# Patient Record
Sex: Male | Born: 1945 | Race: Black or African American | Hispanic: No | Marital: Married | Smoking: Former smoker
Health system: Southern US, Community
[De-identification: ages and names within clinical notes are randomized; demographics above are authoritative.]

## PROBLEM LIST (undated history)

## (undated) DIAGNOSIS — I10 Essential (primary) hypertension: Secondary | ICD-10-CM

## (undated) DIAGNOSIS — K219 Gastro-esophageal reflux disease without esophagitis: Secondary | ICD-10-CM

## (undated) DIAGNOSIS — E785 Hyperlipidemia, unspecified: Secondary | ICD-10-CM

## (undated) DIAGNOSIS — R768 Other specified abnormal immunological findings in serum: Secondary | ICD-10-CM

## (undated) DIAGNOSIS — B1920 Unspecified viral hepatitis C without hepatic coma: Secondary | ICD-10-CM

## (undated) DIAGNOSIS — K5792 Diverticulitis of intestine, part unspecified, without perforation or abscess without bleeding: Secondary | ICD-10-CM

## (undated) HISTORY — DX: Gastro-esophageal reflux disease without esophagitis: K21.9

## (undated) HISTORY — DX: Unspecified viral hepatitis C without hepatic coma: B19.20

## (undated) HISTORY — DX: Diverticulitis of intestine, part unspecified, without perforation or abscess without bleeding: K57.92

## (undated) HISTORY — PX: OTHER SURGICAL HISTORY: SHX169

## (undated) HISTORY — DX: Other specified abnormal immunological findings in serum: R76.8

## (undated) HISTORY — DX: Essential (primary) hypertension: I10

## (undated) HISTORY — DX: Hyperlipidemia, unspecified: E78.5

---

## 2009-10-25 DIAGNOSIS — K5792 Diverticulitis of intestine, part unspecified, without perforation or abscess without bleeding: Secondary | ICD-10-CM | POA: Insufficient documentation

## 2009-10-28 ENCOUNTER — Inpatient Hospital Stay (HOSPITAL_COMMUNITY): Admission: EM | Admit: 2009-10-28 | Discharge: 2009-10-30 | Payer: Self-pay | Admitting: Emergency Medicine

## 2009-10-29 ENCOUNTER — Ambulatory Visit: Payer: Self-pay | Admitting: Gastroenterology

## 2009-11-03 ENCOUNTER — Inpatient Hospital Stay (HOSPITAL_COMMUNITY): Admission: EM | Admit: 2009-11-03 | Discharge: 2009-11-06 | Payer: Self-pay | Admitting: Emergency Medicine

## 2009-11-04 ENCOUNTER — Encounter: Payer: Self-pay | Admitting: Internal Medicine

## 2009-11-05 ENCOUNTER — Ambulatory Visit: Payer: Self-pay | Admitting: Gastroenterology

## 2009-11-05 ENCOUNTER — Encounter (INDEPENDENT_AMBULATORY_CARE_PROVIDER_SITE_OTHER): Payer: Self-pay

## 2009-12-23 ENCOUNTER — Encounter (INDEPENDENT_AMBULATORY_CARE_PROVIDER_SITE_OTHER): Payer: Self-pay | Admitting: *Deleted

## 2010-04-26 NOTE — Letter (Signed)
Summary: CONSULTATION  CONSULTATION   Imported By: Rexene Alberts 11/04/2009 16:06:34  _____________________________________________________________________  External Attachment:    Type:   Image     Comment:   External Document

## 2010-04-26 NOTE — Miscellaneous (Signed)
Summary: CONSULTATION  Clinical Lists Changes  NAME:  Logan Price, Logan Price                 ACCOUNT NO.:  [id number]      MEDICAL RECORD NO.:  [id number]          PATIENT TYPE:  INP      LOCATION:  A231                          FACILITY:  [facility]      PHYSICIAN:  Jonette Eva, M.D.     DATE OF BIRTH:  10-17-1945      DATE OF CONSULTATION:  10/29/2009   DATE OF DISCHARGE:                                    CONSULTATION      PHYSICIAN REQUESTING CONSULTATION:  Dr. Osvaldo Shipper with [facility].      REASON FOR CONSULTATION:  GI bleed.      No PCP yet.      HISTORY OF PRESENT ILLNESS:  Patient is a very pleasant 65 year old   African American gentleman who presented to the hospital yesterday with   acute onset GI bleeding.  He has a history of hypertension, but,   otherwise, is quite healthy.  Around 2:30 yesterday afternoon he felt   the urge to have a bowel movement, and he went to the bathroom and   passed a large quantity of blood mixed in stool.  He denies any   associated abdominal, nausea or vomiting.  No fever or chills.  He did   not feel dizzy or lightheaded.  About an hour later he had another   episode of large volume blood per rectum.  He denies passing any blood   clots.  No melena.  At around 7 o'clock he had his third episode,   although states it was a smaller amount of blood noted at that time.      He rarely has heartburn.  Denies any nausea or vomiting, weight loss.   He has been very healthy.  He recalls 3 years ago when he had a routine   elective colonoscopy.  He had 3 polyps removed, and this was complicated   by post polypectomy bleed, but did not have to be rescoped.  He is not   sure if he had diverticulosis at the time.      MEDICATIONS AT HOME:   1. Aspirin 81 mg daily.   2. Norvasc 10 mg daily.   3. Multivitamin daily.      ALLERGIES:  No known drug allergies.      PAST MEDICAL HISTORY:   1. Hypertension.   2. History of colonoscopy as  outlined above.      PAST SURGICAL HISTORY:  None.      SOCIAL HISTORY:  He moved here in June with his wife from [place].  He   has a sibling as well as his father is here.  He is retired from the   [employer], site.  He quit smoking 4 years ago.  He   consumes about 20 ounces of beer on a daily basis and 2 drinks of liquor   over the weekend.  Denies any drug use.      FAMILY HISTORY:  Mother died of breast cancer.  Father has a pacemaker   and hypertension.  Sister has diabetes.      REVIEW OF SYSTEMS:  See HPI for GI and CONSTITUTIONAL.  CARDIOPULMONARY:   Denies chest pain, shortness of breath, palpitations or cough.   GENITOURINARY:  Denies any dysuria, hematuria      PHYSICAL EXAMINATION:  Temp 97.6, pulse 78, respirations 18, blood   pressure 116/66, O2 saturations 96% on room air, weight 75.4 kg, height   69 inches.  GENERAL:  Pleasant, well-nourished, well-developed black   gentleman in no acute distress.   SKIN:  Warm and dry.  No jaundice.   HEENT:  Sclerae nonicteric.  Oropharyngeal mucosa moist and pink.  No   lesions, erythema or exudate.  No lymphadenopathy, thyromegaly.   CHEST/LUNGS:  Clear to auscultation.   CARDIAC:  Reveals regular rate and rhythm.  No murmurs, rubs, no   gallops.   ABDOMEN:  Positive bowel sounds, soft, nontender, nondistended.  No   organomegaly or masses.  No rebound or guarding.  No abdominal bruits or   hernias.   LOWER EXTREMITIES:  No edema.   RECTAL:  Done in the ED showed gross blood per rectum, nonbleeding   external hemorrhoid noted as well.      LABORATORIES:  Sodium 139, potassium 4, BUN 8, creatinine 0.85, glucose   106. LFTs normal except albumin of 3.2.  Initially his hemoglobin was   13.2, around 1500 yesterday.  At 2200 it was 11.3, and this morning   11.5.  His white count 7300, platelets 193,000.  INR is 1.      IMPRESSION:  Patient is a very pleasant 65 year old gentleman who   presents with lower  gastrointestinal bleed with a 2 g drop in his   hemoglobin.  Etiology unclear at this time.  He could have a   diverticular bleed, bleeding from polyps, less likely malignancy.  Did   not appear to be related to ischemic colitis given no abdominal pain.      RECOMMENDATIONS:   1. Serial H and H while active bleeding.   2. Discuss further with Dr. Darrick Penna regarding possible colonoscopy       versus waiting for records first.               Tana Coast, P.A.         ______________________________   Jonette Eva, M.D.         LL/MEDQ  D:  10/29/2009  T:  10/29/2009  Job:  [id number]      cc:   Osvaldo Shipper, MD      Electronically Signed by Tana Coast P.A. on 11/01/2009 08:41:02 AM   Electronically Signed by Jonette Eva M.D. on 12/10/2009 03:29:29 PM

## 2010-06-10 LAB — TYPE AND SCREEN
ABO/RH(D): O POS
Antibody Screen: NEGATIVE

## 2010-06-10 LAB — CBC
HCT: 28 % — ABNORMAL LOW (ref 39.0–52.0)
HCT: 28.3 % — ABNORMAL LOW (ref 39.0–52.0)
HCT: 28.4 % — ABNORMAL LOW (ref 39.0–52.0)
HCT: 30.8 % — ABNORMAL LOW (ref 39.0–52.0)
HCT: 30.9 % — ABNORMAL LOW (ref 39.0–52.0)
HCT: 31.1 % — ABNORMAL LOW (ref 39.0–52.0)
HCT: 33.7 % — ABNORMAL LOW (ref 39.0–52.0)
HCT: 34.1 % — ABNORMAL LOW (ref 39.0–52.0)
Hemoglobin: 10.4 g/dL — ABNORMAL LOW (ref 13.0–17.0)
Hemoglobin: 10.7 g/dL — ABNORMAL LOW (ref 13.0–17.0)
Hemoglobin: 11.3 g/dL — ABNORMAL LOW (ref 13.0–17.0)
Hemoglobin: 9.4 g/dL — ABNORMAL LOW (ref 13.0–17.0)
Hemoglobin: 9.7 g/dL — ABNORMAL LOW (ref 13.0–17.0)
Hemoglobin: 9.7 g/dL — ABNORMAL LOW (ref 13.0–17.0)
MCH: 30.9 pg (ref 26.0–34.0)
MCH: 31 pg (ref 26.0–34.0)
MCH: 31.1 pg (ref 26.0–34.0)
MCH: 31.3 pg (ref 26.0–34.0)
MCH: 31.5 pg (ref 26.0–34.0)
MCHC: 33.6 g/dL (ref 30.0–36.0)
MCHC: 33.7 g/dL (ref 30.0–36.0)
MCHC: 34.3 g/dL (ref 30.0–36.0)
MCHC: 34.4 g/dL (ref 30.0–36.0)
MCV: 91.3 fL (ref 78.0–100.0)
MCV: 91.6 fL (ref 78.0–100.0)
MCV: 91.9 fL (ref 78.0–100.0)
MCV: 92.3 fL (ref 78.0–100.0)
MCV: 92.7 fL (ref 78.0–100.0)
Platelets: 227 10*3/uL (ref 150–400)
Platelets: 229 10*3/uL (ref 150–400)
RBC: 3.39 MIL/uL — ABNORMAL LOW (ref 4.22–5.81)
RBC: 3.6 MIL/uL — ABNORMAL LOW (ref 4.22–5.81)
RBC: 3.63 MIL/uL — ABNORMAL LOW (ref 4.22–5.81)
RBC: 4.22 MIL/uL (ref 4.22–5.81)
RDW: 14.6 % (ref 11.5–15.5)
RDW: 14.6 % (ref 11.5–15.5)
RDW: 14.6 % (ref 11.5–15.5)
RDW: 14.7 % (ref 11.5–15.5)
RDW: 14.8 % (ref 11.5–15.5)
WBC: 5.7 10*3/uL (ref 4.0–10.5)
WBC: 5.9 10*3/uL (ref 4.0–10.5)
WBC: 6.7 10*3/uL (ref 4.0–10.5)
WBC: 6.9 10*3/uL (ref 4.0–10.5)
WBC: 7.4 10*3/uL (ref 4.0–10.5)
WBC: 7.6 10*3/uL (ref 4.0–10.5)

## 2010-06-10 LAB — DIFFERENTIAL
Basophils Absolute: 0 10*3/uL (ref 0.0–0.1)
Basophils Absolute: 0 10*3/uL (ref 0.0–0.1)
Basophils Absolute: 0 10*3/uL (ref 0.0–0.1)
Basophils Absolute: 0.1 10*3/uL (ref 0.0–0.1)
Basophils Relative: 1 % (ref 0–1)
Basophils Relative: 1 % (ref 0–1)
Basophils Relative: 1 % (ref 0–1)
Basophils Relative: 1 % (ref 0–1)
Basophils Relative: 1 % (ref 0–1)
Eosinophils Absolute: 0.1 10*3/uL (ref 0.0–0.7)
Eosinophils Absolute: 0.1 10*3/uL (ref 0.0–0.7)
Eosinophils Absolute: 0.1 10*3/uL (ref 0.0–0.7)
Eosinophils Absolute: 0.1 10*3/uL (ref 0.0–0.7)
Eosinophils Absolute: 0.1 10*3/uL (ref 0.0–0.7)
Eosinophils Absolute: 0.1 10*3/uL (ref 0.0–0.7)
Eosinophils Relative: 2 % (ref 0–5)
Eosinophils Relative: 2 % (ref 0–5)
Eosinophils Relative: 2 % (ref 0–5)
Eosinophils Relative: 2 % (ref 0–5)
Eosinophils Relative: 2 % (ref 0–5)
Lymphocytes Relative: 24 % (ref 12–46)
Lymphocytes Relative: 25 % (ref 12–46)
Lymphocytes Relative: 29 % (ref 12–46)
Lymphocytes Relative: 30 % (ref 12–46)
Lymphocytes Relative: 31 % (ref 12–46)
Lymphocytes Relative: 37 % (ref 12–46)
Lymphs Abs: 1.6 10*3/uL (ref 0.7–4.0)
Lymphs Abs: 1.9 10*3/uL (ref 0.7–4.0)
Lymphs Abs: 1.9 10*3/uL (ref 0.7–4.0)
Lymphs Abs: 1.9 10*3/uL (ref 0.7–4.0)
Lymphs Abs: 2.1 10*3/uL (ref 0.7–4.0)
Lymphs Abs: 2.2 10*3/uL (ref 0.7–4.0)
Monocytes Absolute: 0.5 10*3/uL (ref 0.1–1.0)
Monocytes Absolute: 0.5 10*3/uL (ref 0.1–1.0)
Monocytes Absolute: 0.6 10*3/uL (ref 0.1–1.0)
Monocytes Absolute: 0.6 10*3/uL (ref 0.1–1.0)
Monocytes Absolute: 0.6 10*3/uL (ref 0.1–1.0)
Monocytes Absolute: 0.7 10*3/uL (ref 0.1–1.0)
Monocytes Relative: 6 % (ref 3–12)
Monocytes Relative: 7 % (ref 3–12)
Monocytes Relative: 8 % (ref 3–12)
Monocytes Relative: 8 % (ref 3–12)
Monocytes Relative: 9 % (ref 3–12)
Monocytes Relative: 9 % (ref 3–12)
Monocytes Relative: 9 % (ref 3–12)
Neutro Abs: 3 10*3/uL (ref 1.7–7.7)
Neutro Abs: 3.5 10*3/uL (ref 1.7–7.7)
Neutro Abs: 3.9 10*3/uL (ref 1.7–7.7)
Neutro Abs: 4.4 10*3/uL (ref 1.7–7.7)
Neutro Abs: 4.4 10*3/uL (ref 1.7–7.7)
Neutro Abs: 4.4 10*3/uL (ref 1.7–7.7)
Neutro Abs: 5.4 10*3/uL (ref 1.7–7.7)
Neutrophils Relative %: 51 % (ref 43–77)
Neutrophils Relative %: 51 % (ref 43–77)
Neutrophils Relative %: 58 % (ref 43–77)
Neutrophils Relative %: 60 % (ref 43–77)
Neutrophils Relative %: 62 % (ref 43–77)
Neutrophils Relative %: 68 % (ref 43–77)

## 2010-06-10 LAB — BASIC METABOLIC PANEL
BUN: 15 mg/dL (ref 6–23)
Calcium: 8.7 mg/dL (ref 8.4–10.5)
Chloride: 108 mEq/L (ref 96–112)
Creatinine, Ser: 1.01 mg/dL (ref 0.4–1.5)
GFR calc Af Amer: >60 mL/min (ref >60)

## 2010-06-10 LAB — COMPREHENSIVE METABOLIC PANEL
ALT: 12 U/L (ref 0–53)
Alkaline Phosphatase: 39 U/L (ref 39–117)
Alkaline Phosphatase: 40 U/L (ref 39–117)
BUN: 8 mg/dL (ref 6–23)
CO2: 26 mEq/L (ref 19–32)
Calcium: 8.1 mg/dL — ABNORMAL LOW (ref 8.4–10.5)
Creatinine, Ser: 0.85 mg/dL (ref 0.4–1.5)
GFR calc non Af Amer: >60 mL/min (ref >60)
Glucose, Bld: 106 mg/dL — ABNORMAL HIGH (ref 70–99)
Glucose, Bld: 119 mg/dL — ABNORMAL HIGH (ref 70–99)
Potassium: 3.6 mEq/L (ref 3.5–5.1)
Sodium: 141 mEq/L (ref 135–145)
Total Protein: 5.7 g/dL — ABNORMAL LOW (ref 6.0–8.3)
Total Protein: 6.3 g/dL (ref 6.0–8.3)

## 2010-06-10 LAB — PROTIME-INR
INR: 1.02 (ref 0.00–1.49)
Prothrombin Time: 13.4 seconds (ref 11.6–15.2)

## 2010-06-10 LAB — HEMOGLOBIN AND HEMATOCRIT, BLOOD: HCT: 35.3 % — ABNORMAL LOW (ref 39.0–52.0)

## 2010-06-21 DIAGNOSIS — I10 Essential (primary) hypertension: Secondary | ICD-10-CM

## 2010-06-21 DIAGNOSIS — K5792 Diverticulitis of intestine, part unspecified, without perforation or abscess without bleeding: Secondary | ICD-10-CM

## 2010-06-21 DIAGNOSIS — E785 Hyperlipidemia, unspecified: Secondary | ICD-10-CM

## 2012-04-10 DIAGNOSIS — E785 Hyperlipidemia, unspecified: Secondary | ICD-10-CM | POA: Diagnosis not present

## 2012-04-10 DIAGNOSIS — I10 Essential (primary) hypertension: Secondary | ICD-10-CM | POA: Diagnosis not present

## 2012-10-08 ENCOUNTER — Ambulatory Visit (INDEPENDENT_AMBULATORY_CARE_PROVIDER_SITE_OTHER): Payer: Medicare Other | Admitting: Family Medicine

## 2012-10-08 ENCOUNTER — Encounter: Payer: Self-pay | Admitting: Family Medicine

## 2012-10-08 VITALS — BP 126/79 | HR 69 | Temp 97.6°F | Ht 68.0 in | Wt 171.2 lb

## 2012-10-08 DIAGNOSIS — I10 Essential (primary) hypertension: Secondary | ICD-10-CM

## 2012-10-08 DIAGNOSIS — E785 Hyperlipidemia, unspecified: Secondary | ICD-10-CM | POA: Diagnosis not present

## 2012-10-08 DIAGNOSIS — Z125 Encounter for screening for malignant neoplasm of prostate: Secondary | ICD-10-CM

## 2012-10-08 DIAGNOSIS — K219 Gastro-esophageal reflux disease without esophagitis: Secondary | ICD-10-CM | POA: Diagnosis not present

## 2012-10-08 MED ORDER — AMLODIPINE BESYLATE 10 MG PO TABS: 10.0000 mg | ORAL_TABLET | Freq: Every day | ORAL | Status: DC

## 2012-10-08 MED ORDER — SIMVASTATIN 40 MG PO TABS: 40.0000 mg | ORAL_TABLET | Freq: Every day | ORAL | Status: DC

## 2012-10-08 NOTE — Progress Notes (Signed)
Patient ID: Logan Price, male   DOB: [birth year], 67 y.o.   MRN: [id number] SUBJECTIVE: CC: Chief Complaint  Patient presents with  . Follow-up    6 month follow up    HPI: 1) Patient is here for follow up of hypertension: denies Headache;deniesChest Pain;denies weakness;denies Shortness of Breath or Orthopnea;denies Visual changes;denies palpitations;denies cough;denies pedal edema;denies symptoms of TIA or stroke; admits to Compliance with medications. denies Problems with medications.  2)Patient is here for follow up of hyperlipidemia: denies Headache;denies Chest Pain;denies weakness;denies Shortness of Breath and orthopnea;denies Visual changes;denies palpitations;denies cough;denies pedal edema;denies symptoms of TIA or stroke;deniesClaudication symptoms. admits to Compliance with medications; denies Problems with medications.  Past Medical History  Diagnosis Date  . Diverticulitis   . HTN (hypertension)   . Hyperlipidemia    No past surgical history on file. History   Social History  . Marital Status: Married    Spouse Name: N/A    Number of Children: N/A  . Years of Education: N/A   Occupational History  . Not on file.   Social History Main Topics  . Smoking status: Former Smoker    Quit date: 10/09/2006  . Smokeless tobacco: Not on file  . Alcohol Use: Not on file  . Drug Use: Not on file  . Sexually Active: Not on file   Other Topics Concern  . Not on file   Social History Narrative  . No narrative on file   No family history on file. Current Outpatient Prescriptions on File Prior to Visit  Medication Sig Dispense Refill  . multivitamin (THERAGRAN) per tablet Take 1 tablet by mouth daily.        . Omeprazole Magnesium (PRILOSEC OTC PO) Take by mouth. OTC        No current facility-administered medications on file prior to visit.   No Known Allergies Immunization History  Administered Date(s) Administered  . Tdap 03/04/2010   Prior to Admission  medications   Medication Sig Start Date End Date Taking? Authorizing Provider  amLODipine (NORVASC) 10 MG tablet Take 1 tablet (10 mg total) by mouth daily. 10/08/12  Yes Ileana Ladd, MD  multivitamin [facility]) per tablet Take 1 tablet by mouth daily.     Yes Historical Provider, MD  simvastatin (ZOCOR) 40 MG tablet Take 1 tablet (40 mg total) by mouth at bedtime. 10/08/12  Yes Ileana Ladd, MD  Omeprazole Magnesium (PRILOSEC OTC PO) Take by mouth. OTC     Historical Provider, MD     ROS: As above in the HPI. All other systems are stable or negative.  OBJECTIVE: APPEARANCE:  Patient in no acute distress.The patient appeared well nourished and normally developed. Acyanotic. Waist: VITAL SIGNS:BP 126/79  Pulse 69  Temp(Src) 97.6 F (36.4 C) (Oral)  Ht 5\' 8"  (1.727 m)  Wt 171 lb 3.2 oz (77.656 kg)  BMI 26.04 kg/m2  AAM  SKIN: warm and  Dry without overt rashes, tattoos and scars  HEAD and Neck: without JVD, Head and scalp: normal Eyes:No scleral icterus. Fundi normal, eye movements normal. Ears: Auricle normal, canal normal, Tympanic membranes normal, insufflation normal. Nose: normal Throat: normal Neck & thyroid: normal  CHEST & LUNGS: Chest wall: normal Lungs: Clear  CVS: Reveals the PMI to be normally located. Regular rhythm, First and Second Heart sounds are normal,  absence of murmurs, rubs or gallops. Peripheral vasculature: Radial pulses: normal Dorsal pedis pulses: normal Posterior pulses: normal  ABDOMEN:  Appearance: normal Benign, no organomegaly, no masses, no  Abdominal Aortic enlargement. No Guarding , no rebound. No Bruits. Bowel sounds: normal  RECTAL: N/A GU: N/A  EXTREMETIES: nonedematous. Both Femoral and Pedal pulses are normal.  MUSCULOSKELETAL:  Spine: normal Joints: intact  NEUROLOGIC: oriented to time,place and person; nonfocal. Strength is normal Sensory is normal Reflexes are normal Cranial Nerves are  normal.  ASSESSMENT: HTN (hypertension) - Plan: COMPLETE METABOLIC PANEL WITH GFR, amLODipine (NORVASC) 10 MG tablet, DISCONTINUED: amLODipine (NORVASC) 10 MG tablet  HLD (hyperlipidemia) - Plan: COMPLETE METABOLIC PANEL WITH GFR, NMR Lipoprofile with Lipids, simvastatin (ZOCOR) 40 MG tablet  GERD (gastroesophageal reflux disease)  Screening for prostate cancer - Plan: PSA    PLAN: Orders Placed This Encounter  Procedures  . COMPLETE METABOLIC PANEL WITH GFR  . NMR Lipoprofile with Lipids  . PSA   Meds ordered this encounter  Medications  . DISCONTD: amLODipine (NORVASC) 10 MG tablet    Sig: Take 1 tablet (10 mg total) by mouth daily.    Dispense:  30 tablet    Refill:  11  . simvastatin (ZOCOR) 40 MG tablet    Sig: Take 1 tablet (40 mg total) by mouth at bedtime.    Dispense:  30 tablet    Refill:  11  . amLODipine (NORVASC) 10 MG tablet    Sig: Take 1 tablet (10 mg total) by mouth daily.    Dispense:  90 tablet    Refill:  3        Dr Woodroe Mode Recommendations  Diet and Exercise discussed with patient.  For nutrition information, I recommend books:  1).Eat to Live by Dr Monico Hoar. 2).Prevent and Reverse Heart Disease by Dr Suzzette Righter. 3) Dr Katherina Right Book:  Program to Reverse Diabetes  Exercise recommendations are:  If unable to walk, then the patient can exercise in a chair 3 times a day. By flapping arms like a bird gently and raising legs outwards to the front.  If ambulatory, the patient can go for walks for 30 minutes 3 times a week. Then increase the intensity and duration as tolerated.  Goal is to try to attain exercise frequency to 5 times a week.  If applicable: Best to perform resistance exercises (machines or weights) 2 days a week and cardio type exercises 3 days per week.  Return in about 6 months (around 04/10/2013) for Recheck medical problems.  Constantine Ruddick P. [place]esto Charon, M.D.

## 2012-10-08 NOTE — Patient Instructions (Addendum)
      Dr Tally Mckinnon's Recommendations  Diet and Exercise discussed with patient.  For nutrition information, I recommend books:  1).Eat to Live by Dr Joel Fuhrman. 2).Prevent and Reverse Heart Disease by Dr Caldwell Esselstyn. 3) Dr Neal Barnard's Book:  Program to Reverse Diabetes  Exercise recommendations are:  If unable to walk, then the patient can exercise in a chair 3 times a day. By flapping arms like a bird gently and raising legs outwards to the front.  If ambulatory, the patient can go for walks for 30 minutes 3 times a week. Then increase the intensity and duration as tolerated.  Goal is to try to attain exercise frequency to 5 times a week.  If applicable: Best to perform resistance exercises (machines or weights) 2 days a week and cardio type exercises 3 days per week.  

## 2012-10-09 LAB — NMR LIPOPROFILE WITH LIPIDS
Cholesterol, Total: 179 mg/dL (ref <200)
HDL Particle Number: 48.5 umol/L (ref >=30.5)
HDL Size: 9.1 nm — ABNORMAL LOW (ref >=9.2)
HDL-C: 68 mg/dL (ref >=40)
LDL (calc): 99 mg/dL (ref <100)
LDL Particle Number: 1586 nmol/L — ABNORMAL HIGH (ref <1000)
LDL Size: 20.5 nm — ABNORMAL LOW (ref >20.5)
LP-IR Score: 66 — ABNORMAL HIGH (ref <=45)
Large HDL-P: 8.1 umol/L (ref >=4.8)
Large VLDL-P: 2.8 nmol/L — ABNORMAL HIGH (ref <=2.7)
Small LDL Particle Number: 745 nmol/L — ABNORMAL HIGH (ref <=527)
Triglycerides: 60 mg/dL (ref <150)
VLDL Size: 64.9 nm — ABNORMAL HIGH (ref <=46.6)

## 2012-10-09 LAB — COMPLETE METABOLIC PANEL WITH GFR
ALT: 17 U/L (ref 0–53)
AST: 24 U/L (ref 0–37)
Albumin: 4.4 g/dL (ref 3.5–5.2)
Alkaline Phosphatase: 57 U/L (ref 39–117)
BUN: 16 mg/dL (ref 6–23)
CO2: 27 mEq/L (ref 19–32)
Calcium: 9.5 mg/dL (ref 8.4–10.5)
Chloride: 105 mEq/L (ref 96–112)
Creat: 1.08 mg/dL (ref 0.50–1.35)
GFR, Est African American: 83 mL/min
GFR, Est Non African American: 72 mL/min
Glucose, Bld: 99 mg/dL (ref 70–99)
Potassium: 4.3 mEq/L (ref 3.5–5.3)
Sodium: 141 mEq/L (ref 135–145)
Total Bilirubin: 0.8 mg/dL (ref 0.3–1.2)
Total Protein: 7.7 g/dL (ref 6.0–8.3)

## 2012-10-09 LAB — PSA: PSA: 0.4 ng/mL (ref <=4.00)

## 2012-10-11 NOTE — Progress Notes (Signed)
Quick Note:  Lab result close to goalgoal. No change in Medications for now. Dietary changes and exercise is necessary. No Change in plans and follow up. ______

## 2012-10-30 ENCOUNTER — Other Ambulatory Visit: Payer: Self-pay

## 2012-11-08 ENCOUNTER — Ambulatory Visit (INDEPENDENT_AMBULATORY_CARE_PROVIDER_SITE_OTHER): Payer: Medicare Other | Admitting: Family Medicine

## 2012-11-08 DIAGNOSIS — T6391XA Toxic effect of contact with unspecified venomous animal, accidental (unintentional), initial encounter: Secondary | ICD-10-CM

## 2012-11-08 MED ORDER — TRIAMCINOLONE ACETONIDE 40 MG/ML IJ SUSP
60.0000 mg | Freq: Once | INTRAMUSCULAR | Status: AC
  Administered 2012-11-08: 60 mg via INTRAMUSCULAR

## 2012-11-08 MED ORDER — DOXYCYCLINE HYCLATE 100 MG PO TABS: 100.0000 mg | ORAL_TABLET | Freq: Two times a day (BID) | ORAL | Status: DC

## 2012-11-08 NOTE — Progress Notes (Signed)
Subjective:    Patient ID: Logan Price, male    DOB: Apr 02, 1945, 67 y.o.   MRN: [id number]  HPI This 67 y.o. male presents for evaluation of bee sting on the neck. He has swelling and discomfort on his neck.  The wasp sting  Happened yesterday.   Review of Systems C/o erythema and swelling on posterior neck.   No chest pain, SOB, HA, dizziness, vision change, N/V, diarrhea, constipation, dysuria, urinary urgency or frequency, myalgias, arthralgias or rash.  Objective:   Physical Exam  Vital signs noted  Well developed well nourished male.  HEENT - Head atraumatic Normocephalic                Throat - oropharanx wnl Respiratory - Lungs CTA bilateral Cardiac - RRR S1 and S2 without murmur Skin - are of 2.5cm posterior neck with erythema and swelling with central puncture      Assessment & Plan:  Wasp sting, initial encounter - Plan: triamcinolone acetonide (KENALOG-40) injection 60 mg, doxycycline (VIBRA-TABS) 100 MG tablet.  Apply ice to neck area and take benadryl otc as directed.  Tylenol and motrin otc as directed.

## 2012-11-08 NOTE — Patient Instructions (Signed)
Bee, Wasp, or Hornet Sting °Your caregiver has diagnosed you as having an insect sting. An insect sting appears as a red lump in the skin that sometimes has a tiny hole in the center, or it may have a stinger in the center of the wound. The most common stings are from wasps, hornets and bees. °Individuals have different reactions to insect stings. °· A normal reaction may cause pain, swelling, and redness around the sting site. °· A localized allergic reaction may cause swelling and redness that extends beyond the sting site. °· A large local reaction may continue to develop over the next 12 to 36 hours. °· On occasion, the reactions can be severe (anaphylactic reaction). An anaphylactic reaction may cause wheezing; difficulty breathing; chest pain; fainting; raised, itchy, red patches on the skin; a sick feeling to your stomach (nausea); vomiting; cramping; or diarrhea. If you have had an anaphylactic reaction to an insect sting in the past, you are more likely to have one again. °HOME CARE INSTRUCTIONS  °· With bee stings, a small sac of poison is left in the wound. Brushing across this with something such as a credit card, or anything similar, will help remove this and decrease the amount of the reaction. This same procedure will not help a wasp sting as they do not leave behind a stinger and poison sac. °· Apply a cold compress for 10 to 20 minutes every hour for 1 to 2 days, depending on severity, to reduce swelling and itching. °· To lessen pain, a paste made of water and baking soda may be rubbed on the bite or sting and left on for 5 minutes. °· To relieve itching and swelling, you may use take medication or apply medicated creams or lotions as directed. °· Only take over-the-counter or prescription medicines for pain, discomfort, or fever as directed by your caregiver. °· Wash the sting site daily with soap and water. Apply antibiotic ointment on the sting site as directed. °· If you suffered a severe  reaction: °· If you did not require hospitalization, an adult will need to stay with you for 24 hours in case the symptoms return. °· You may need to wear a medical bracelet or necklace stating the allergy. °· You and your family need to learn when and how to use an anaphylaxis kit or epinephrine injection. °· If you have had a severe reaction before, always carry your anaphylaxis kit with you. °SEEK MEDICAL CARE IF:  °· None of the above helps within 2 to 3 days. °· The area becomes red, warm, tender, and swollen beyond the area of the bite or sting. °· You have an oral temperature above 102° F (38.9° C). °SEEK IMMEDIATE MEDICAL CARE IF:  °You have symptoms of an allergic reaction which are: °· Wheezing. °· Difficulty breathing. °· Chest pain. °· Lightheadedness or fainting. °· Itchy, raised, red patches on the skin. °· Nausea, vomiting, cramping or diarrhea. °ANY OF THESE SYMPTOMS MAY REPRESENT A SERIOUS PROBLEM THAT IS AN EMERGENCY. Do not wait to see if the symptoms will go away. Get medical help right away. Call your local emergency services (911 in [place] DO NOT drive yourself to the hospital. °MAKE SURE YOU:  °· Understand these instructions. °· Will watch your condition. °· Will get help right away if you are not doing well or get worse. °Document Released: 03/13/2005 Document Revised: 06/05/2011 Document Reviewed: 08/28/2009 °ExitCare® Patient Information ©2014 [employer]. ° °

## 2012-12-06 ENCOUNTER — Other Ambulatory Visit: Payer: Self-pay | Admitting: *Deleted

## 2012-12-06 DIAGNOSIS — E785 Hyperlipidemia, unspecified: Secondary | ICD-10-CM

## 2012-12-06 MED ORDER — SIMVASTATIN 40 MG PO TABS: 40.0000 mg | ORAL_TABLET | Freq: Every day | ORAL | Status: DC

## 2013-01-30 ENCOUNTER — Other Ambulatory Visit: Payer: Self-pay

## 2013-04-11 ENCOUNTER — Ambulatory Visit (INDEPENDENT_AMBULATORY_CARE_PROVIDER_SITE_OTHER): Payer: Medicare Other | Admitting: Family Medicine

## 2013-04-11 ENCOUNTER — Encounter: Payer: Self-pay | Admitting: Family Medicine

## 2013-04-11 VITALS — BP 114/70 | HR 75 | Temp 97.5°F | Ht 68.0 in | Wt 171.4 lb

## 2013-04-11 DIAGNOSIS — K5732 Diverticulitis of large intestine without perforation or abscess without bleeding: Secondary | ICD-10-CM | POA: Diagnosis not present

## 2013-04-11 DIAGNOSIS — E785 Hyperlipidemia, unspecified: Secondary | ICD-10-CM

## 2013-04-11 DIAGNOSIS — I10 Essential (primary) hypertension: Secondary | ICD-10-CM

## 2013-04-11 DIAGNOSIS — K219 Gastro-esophageal reflux disease without esophagitis: Secondary | ICD-10-CM | POA: Diagnosis not present

## 2013-04-11 DIAGNOSIS — K5792 Diverticulitis of intestine, part unspecified, without perforation or abscess without bleeding: Secondary | ICD-10-CM

## 2013-04-11 NOTE — Progress Notes (Signed)
Patient ID: Logan Price, male   DOB: 07-12-1945, 68 y.o.   MRN: [id number] SUBJECTIVE: CC: Chief Complaint  Patient presents with  . Follow-up      6 month follow up no complaints     HPI: Patient is here for follow up of hyperlipidemia/HTN/GERD: denies Headache;denies Chest Pain;denies weakness;denies Shortness of Breath and orthopnea;denies Visual changes;denies palpitations;denies cough;denies pedal edema;denies symptoms of TIA or stroke;deniesClaudication symptoms. admits to Compliance with medications; denies Problems with medications.   Past Medical History  Diagnosis Date  . Diverticulitis   . HTN (hypertension)   . Hyperlipidemia    No past surgical history on file. History   Social History  . Marital Status: Married    Spouse Name: N/A    Number of Children: N/A  . Years of Education: N/A   Occupational History  . Not on file.   Social History Main Topics  . Smoking status: Former Smoker    Quit date: 10/09/2006  . Smokeless tobacco: Not on file  . Alcohol Use: No  . Drug Use: No  . Sexual Activity: Not on file   Other Topics Concern  . Not on file   Social History Narrative  . No narrative on file   No family history on file. Current Outpatient Prescriptions on File Prior to Visit  Medication Sig Dispense Refill  . amLODipine (NORVASC) 10 MG tablet Take 1 tablet (10 mg total) by mouth daily.  90 tablet  3  . multivitamin (THERAGRAN) per tablet Take 1 tablet by mouth daily.        . Omeprazole Magnesium (PRILOSEC OTC PO) Take by mouth. OTC       . simvastatin (ZOCOR) 40 MG tablet Take 1 tablet (40 mg total) by mouth at bedtime.  90 tablet  3  . doxycycline (VIBRA-TABS) 100 MG tablet Take 1 tablet (100 mg total) by mouth 2 (two) times daily.  20 tablet  0   No current facility-administered medications on file prior to visit.   No Known Allergies Immunization History  Administered Date(s) Administered  . Tdap 03/04/2010   Prior to Admission  medications   Medication Sig Start Date End Date Taking? Authorizing Provider  amLODipine (NORVASC) 10 MG tablet Take 1 tablet (10 mg total) by mouth daily. 10/08/12  Yes Ileana Ladd, MD  multivitamin [facility]) per tablet Take 1 tablet by mouth daily.     Yes Historical Provider, MD  Omeprazole Magnesium (PRILOSEC OTC PO) Take by mouth. OTC    Yes Historical Provider, MD  simvastatin (ZOCOR) 40 MG tablet Take 1 tablet (40 mg total) by mouth at bedtime. 12/06/12  Yes Ileana Ladd, MD  doxycycline (VIBRA-TABS) 100 MG tablet Take 1 tablet (100 mg total) by mouth 2 (two) times daily. 11/08/12   Deatra Canter, FNP     ROS: As above in the HPI. All other systems are stable or negative.  OBJECTIVE: APPEARANCE:  Patient in no acute distress.The patient appeared well nourished and normally developed. Acyanotic. Waist: VITAL SIGNS:BP 114/70  Pulse 75  Temp(Src) 97.5 F (36.4 C) (Oral)  Ht 5\' 8"  (1.727 m)  Wt 171 lb 6.4 oz (77.747 kg)  BMI 26.07 kg/m2  AAM  SKIN: warm and  Dry without overt rashes, tattoos and scars  HEAD and Neck: without JVD, Head and scalp: normal Eyes:No scleral icterus. Fundi normal, eye movements normal. Ears: Auricle normal, canal normal, Tympanic membranes normal, insufflation normal. Nose: normal Throat: normal Neck & thyroid: normal  CHEST &  LUNGS: Chest wall: normal Lungs: Clear  CVS: Reveals the PMI to be normally located. Regular rhythm, First and Second Heart sounds are normal,  absence of murmurs, rubs or gallops. Peripheral vasculature: Radial pulses: normal Dorsal pedis pulses: normal Posterior pulses: normal  ABDOMEN:  Appearance: normal Benign, no organomegaly, no masses, no Abdominal Aortic enlargement. No Guarding , no rebound. No Bruits. Bowel sounds: normal  RECTAL: N/A GU: N/A  EXTREMETIES: nonedematous.  MUSCULOSKELETAL:  Spine: normal Joints: intact  NEUROLOGIC: oriented to time,place and person;  nonfocal. Strength is normal Sensory is normal Reflexes are normal Cranial Nerves are normal.   Results for orders placed in visit on 10/08/12  COMPLETE METABOLIC PANEL WITH GFR      Result Value Range   Sodium 141  135 - 145 mEq/L   Potassium 4.3  3.5 - 5.3 mEq/L   Chloride 105  96 - 112 mEq/L   CO2 27  19 - 32 mEq/L   Glucose, Bld 99  70 - 99 mg/dL   BUN 16  6 - 23 mg/dL   Creat 5.62  1.30 - 8.65 mg/dL   Total Bilirubin 0.8  0.3 - 1.2 mg/dL   Alkaline Phosphatase 57  39 - 117 U/L   AST 24  0 - 37 U/L   ALT 17  0 - 53 U/L   Total Protein 7.7  6.0 - 8.3 g/dL   Albumin 4.4  3.5 - 5.2 g/dL   Calcium 9.5  8.4 - 78.4 mg/dL   GFR, Est African American 83     GFR, Est Non African American 72    NMR LIPOPROFILE WITH LIPIDS      Result Value Range   LDL Particle Number [id number] (*) <1000 nmol/L   LDL (calc) 99  <100 mg/dL   HDL-C 68  >=69 mg/dL   Triglycerides 60  <629 mg/dL   Cholesterol, Total 528  <200 mg/dL   HDL Particle Number 41.3  >=24.4 umol/L   Large HDL-P 8.1  >=4.8 umol/L   Large VLDL-P 2.8 (*) <=2.7 nmol/L   Small LDL Particle Number [id number] (*) <=527 nmol/L   LDL Size 20.5 (*) >20.5 nm   HDL Size 9.1 (*) >=9.2 nm   VLDL Size 64.9 (*) <=46.6 nm   LP-IR Score 66 (*) <=45  PSA      Result Value Range   PSA 0.40  <=4.00 ng/mL    ASSESSMENT:  HLD (hyperlipidemia) - Plan: CMP14+EGFR, Lipid panel  HTN (hypertension) - Plan: CMP14+EGFR  GERD (gastroesophageal reflux disease)  Diverticulitis  Doing well and stable.   PLAN:      Dr Woodroe Mode Recommendations  For nutrition information, I recommend books:  1).Eat to Live by Dr Monico Hoar. 2).Prevent and Reverse Heart Disease by Dr Suzzette Righter. 3) Dr Katherina Right Book:  Program to Reverse Diabetes  Exercise recommendations are:  If unable to walk, then the patient can exercise in a chair 3 times a day. By flapping arms like a bird gently and raising legs outwards to the front.  If ambulatory,  the patient can go for walks for 30 minutes 3 times a week. Then increase the intensity and duration as tolerated.  Goal is to try to attain exercise frequency to 5 times a week.  If applicable: Best to perform resistance exercises (machines or weights) 2 days a week and cardio type exercises 3 days per week.  Handouts in the AVS   Orders Placed This Encounter  Procedures  .  CMP14+EGFR  . Lipid panel    No orders of the defined types were placed in this encounter.   There are no discontinued medications. Return in about 6 months (around 10/09/2013) for Recheck medical problems.  Lakrista Scaduto P. [place] Charon, M.D.

## 2013-04-11 NOTE — Patient Instructions (Signed)
Dr Paula Libra Recommendations  For nutrition information, I recommend books:  1).Eat to Live by Dr Excell Seltzer. 2).Prevent and Reverse Heart Disease by Dr Karl Luke. 3) Dr Janene Harvey Book:  Program to Reverse Diabetes  Exercise recommendations are:  If unable to walk, then the patient can exercise in a chair 3 times a day. By flapping arms like a bird gently and raising legs outwards to the front.  If ambulatory, the patient can go for walks for 30 minutes 3 times a week. Then increase the intensity and duration as tolerated.  Goal is to try to attain exercise frequency to 5 times a week.  If applicable: Best to perform resistance exercises (machines or weights) 2 days a week and cardio type exercises 3 days per week.   Hypertension As your heart beats, it forces blood through your arteries. This force is your blood pressure. If the pressure is too high, it is called hypertension (HTN) or high blood pressure. HTN is dangerous because you may have it and not know it. High blood pressure may mean that your heart has to work harder to pump blood. Your arteries may be narrow or stiff. The extra work puts you at risk for heart disease, stroke, and other problems.  Blood pressure consists of two numbers, a higher number over a lower, 110/72, for example. It is stated as "110 over 72." The ideal is below 120 for the top number (systolic) and under 80 for the bottom (diastolic). Write down your blood pressure today. You should pay close attention to your blood pressure if you have certain conditions such as:  Heart failure.  Prior heart attack.  Diabetes  Chronic kidney disease.  Prior stroke.  Multiple risk factors for heart disease. To see if you have HTN, your blood pressure should be measured while you are seated with your arm held at the level of the heart. It should be measured at least twice. A one-time elevated blood pressure reading (especially in the  Emergency Department) does not mean that you need treatment. There may be conditions in which the blood pressure is different between your right and left arms. It is important to see your caregiver soon for a recheck. Most people have essential hypertension which means that there is not a specific cause. This type of high blood pressure may be lowered by changing lifestyle factors such as:  Stress.  Smoking.  Lack of exercise.  Excessive weight.  Drug/tobacco/alcohol use.  Eating less salt. Most people do not have symptoms from high blood pressure until it has caused damage to the body. Effective treatment can often prevent, delay or reduce that damage. TREATMENT  When a cause has been identified, treatment for high blood pressure is directed at the cause. There are a large number of medications to treat HTN. These fall into several categories, and your caregiver will help you select the medicines that are best for you. Medications may have side effects. You should review side effects with your caregiver. If your blood pressure stays high after you have made lifestyle changes or started on medicines,   Your medication(s) may need to be changed.  Other problems may need to be addressed.  Be certain you understand your prescriptions, and know how and when to take your medicine.  Be sure to follow up with your caregiver within the time frame advised (usually within two weeks) to have your blood pressure rechecked and to review your medications.  If  you are taking more than one medicine to lower your blood pressure, make sure you know how and at what times they should be taken. Taking two medicines at the same time can result in blood pressure that is too low. SEEK IMMEDIATE MEDICAL CARE IF:  You develop a severe headache, blurred or changing vision, or confusion.  You have unusual weakness or numbness, or a faint feeling.  You have severe chest or abdominal pain, vomiting, or breathing  problems. MAKE SURE YOU:   Understand these instructions.  Will watch your condition.  Will get help right away if you are not doing well or get worse. Document Released: 03/13/2005 Document Revised: 06/05/2011 Document Reviewed: 11/01/2007 [facility] Patient Information 2014 [place].   Hypertriglyceridemia  Diet for High blood levels of Triglycerides Most fats in food are triglycerides. Triglycerides in your blood are stored as fat in your body. High levels of triglycerides in your blood may put you at a greater risk for heart disease and stroke.  Normal triglyceride levels are less than 150 mg/dL. Borderline high levels are 150-199 mg/dl. High levels are 200 - 499 mg/dL, and very high triglyceride levels are greater than 500 mg/dL. The decision to treat high triglycerides is generally based on the level. For people with borderline or high triglyceride levels, treatment includes weight loss and exercise. Drugs are recommended for people with very high triglyceride levels. Many people who need treatment for high triglyceride levels have metabolic syndrome. This syndrome is a collection of disorders that often include: insulin resistance, high blood pressure, blood clotting problems, high cholesterol and triglycerides. TESTING PROCEDURE FOR TRIGLYCERIDES  You should not eat 4 hours before getting your triglycerides measured. The normal range of triglycerides is between 10 and 250 milligrams per deciliter (mg/dl). Some people may have extreme levels (1000 or above), but your triglyceride level may be too high if it is above 150 mg/dl, depending on what other risk factors you have for heart disease.  People with high blood triglycerides may also have high blood cholesterol levels. If you have high blood cholesterol as well as high blood triglycerides, your risk for heart disease is probably greater than if you only had high triglycerides. High blood cholesterol is one of the main risk factors  for heart disease. CHANGING YOUR DIET  Your weight can affect your blood triglyceride level. If you are more than 20% above your ideal body weight, you may be able to lower your blood triglycerides by losing weight. Eating less and exercising regularly is the best way to combat this. Fat provides more calories than any other food. The best way to lose weight is to eat less fat. Only 30% of your total calories should come from fat. Less than 7% of your diet should come from saturated fat. A diet low in fat and saturated fat is the same as a diet to decrease blood cholesterol. By eating a diet lower in fat, you may lose weight, lower your blood cholesterol, and lower your blood triglyceride level.  Eating a diet low in fat, especially saturated fat, may also help you lower your blood triglyceride level. Ask your dietitian to help you figure how much fat you can eat based on the number of calories your caregiver has prescribed for you.  Exercise, in addition to helping with weight loss may also help lower triglyceride levels.   Alcohol can increase blood triglycerides. You may need to stop drinking alcoholic beverages.  Too much carbohydrate in your diet may  also increase your blood triglycerides. Some complex carbohydrates are necessary in your diet. These may include bread, rice, potatoes, other starchy vegetables and cereals.  Reduce "simple" carbohydrates. These may include pure sugars, candy, honey, and jelly without losing other nutrients. If you have the kind of high blood triglycerides that is affected by the amount of carbohydrates in your diet, you will need to eat less sugar and less high-sugar foods. Your caregiver can help you with this.  Adding 2-4 grams of fish oil (EPA+ DHA) may also help lower triglycerides. Speak with your caregiver before adding any supplements to your regimen. Following the Diet  Maintain your ideal weight. Your caregivers can help you with a diet. Generally, eating  less food and getting more exercise will help you lose weight. Joining a weight control group may also help. Ask your caregivers for a good weight control group in your area.  Eat low-fat foods instead of high-fat foods. This can help you lose weight too.  These foods are lower in fat. Eat MORE of these:   Dried beans, peas, and lentils.  Egg whites.  Low-fat cottage cheese.  Fish.  Lean cuts of meat, such as round, sirloin, rump, and flank (cut extra fat off meat you fix).  Whole grain breads, cereals and pasta.  Skim and nonfat dry milk.  Low-fat yogurt.  Poultry without the skin.  Cheese made with skim or part-skim milk, such as mozzarella, parmesan, farmers', ricotta, or pot cheese. These are higher fat foods. Eat LESS of these:   Whole milk and foods made from whole milk, such as American, blue, cheddar, monterey jack, and swiss cheese  High-fat meats, such as luncheon meats, sausages, knockwurst, bratwurst, hot dogs, ribs, corned beef, ground pork, and regular ground beef.  Fried foods. Limit saturated fats in your diet. Substituting unsaturated fat for saturated fat may decrease your blood triglyceride level. You will need to read package labels to know which products contain saturated fats.  These foods are high in saturated fat. Eat LESS of these:   Fried pork skins.  Whole milk.  Skin and fat from poultry.  Palm oil.  Butter.  Shortening.  Cream cheese.  Berniece Salines.  Margarines and baked goods made from listed oils.  Vegetable shortenings.  Chitterlings.  Fat from meats.  Coconut oil.  Palm kernel oil.  Lard.  Cream.  Sour cream.  Fatback.  Coffee whiteners and non-dairy creamers made with these oils.  Cheese made from whole milk. Use unsaturated fats (both polyunsaturated and monounsaturated) moderately. Remember, even though unsaturated fats are better than saturated fats; you still want a diet low in total fat.  These foods are high in  unsaturated fat:   Canola oil.  Sunflower oil.  Mayonnaise.  Almonds.  Peanuts.  Pine nuts.  Margarines made with these oils.  Safflower oil.  Olive oil.  Avocados.  Cashews.  Peanut butter.  Sunflower seeds.  Soybean oil.  Peanut oil.  Olives.  Pecans.  Walnuts.  Pumpkin seeds. Avoid sugar and other high-sugar foods. This will decrease carbohydrates without decreasing other nutrients. Sugar in your food goes rapidly to your blood. When there is excess sugar in your blood, your liver may use it to make more triglycerides. Sugar also contains calories without other important nutrients.  Eat LESS of these:   Sugar, brown sugar, powdered sugar, jam, jelly, preserves, honey, syrup, molasses, pies, candy, cakes, cookies, frosting, pastries, colas, soft drinks, punches, fruit drinks, and regular gelatin.  Avoid alcohol. Alcohol,  even more than sugar, may increase blood triglycerides. In addition, alcohol is high in calories and low in nutrients. Ask for sparkling water, or a diet soft drink instead of an alcoholic beverage. Suggestions for planning and preparing meals   Bake, broil, grill or roast meats instead of frying.  Remove fat from meats and skin from poultry before cooking.  Add spices, herbs, lemon juice or vinegar to vegetables instead of salt, rich sauces or gravies.  Use a non-stick skillet without fat or use no-stick sprays.  Cool and refrigerate stews and broth. Then remove the hardened fat floating on the surface before serving.  Refrigerate meat drippings and skim off fat to make low-fat gravies.  Serve more fish.  Use less butter, margarine and other high-fat spreads on bread or vegetables.  Use skim or reconstituted non-fat dry milk for cooking.  Cook with low-fat cheeses.  Substitute low-fat yogurt or cottage cheese for all or part of the sour cream in recipes for sauces, dips or congealed salads.  Use half yogurt/half mayonnaise in  salad recipes.  Substitute evaporated skim milk for cream. Evaporated skim milk or reconstituted non-fat dry milk can be whipped and substituted for whipped cream in certain recipes.  Choose fresh fruits for dessert instead of high-fat foods such as pies or cakes. Fruits are naturally low in fat. When Dining Out   Order low-fat appetizers such as fruit or vegetable juice, pasta with vegetables or tomato sauce.  Select clear, rather than cream soups.  Ask that dressings and gravies be served on the side. Then use less of them.  Order foods that are baked, broiled, poached, steamed, stir-fried, or roasted.  Ask for margarine instead of butter, and use only a small amount.  Drink sparkling water, unsweetened tea or coffee, or diet soft drinks instead of alcohol or other sweet beverages. QUESTIONS AND ANSWERS ABOUT OTHER FATS IN THE BLOOD: SATURATED FAT, TRANS FAT, AND CHOLESTEROL What is trans fat? Trans fat is a type of fat that is formed when vegetable oil is hardened through a process called hydrogenation. This process helps makes foods more solid, gives them shape, and prolongs their shelf life. Trans fats are also called hydrogenated or partially hydrogenated oils.  What do saturated fat, trans fat, and cholesterol in foods have to do with heart disease? Saturated fat, trans fat, and cholesterol in the diet all raise the level of LDL "bad" cholesterol in the blood. The higher the LDL cholesterol, the greater the risk for coronary heart disease (CHD). Saturated fat and trans fat raise LDL similarly.  What foods contain saturated fat, trans fat, and cholesterol? High amounts of saturated fat are found in animal products, such as fatty cuts of meat, chicken skin, and full-fat dairy products like butter, whole milk, cream, and cheese, and in tropical vegetable oils such as palm, palm kernel, and coconut oil. Trans fat is found in some of the same foods as saturated fat, such as vegetable  shortening, some margarines (especially hard or stick margarine), crackers, cookies, baked goods, fried foods, salad dressings, and other processed foods made with partially hydrogenated vegetable oils. Small amounts of trans fat also occur naturally in some animal products, such as milk products, beef, and lamb. Foods high in cholesterol include liver, other organ meats, egg yolks, shrimp, and full-fat dairy products. How can I use the new food label to make heart-healthy food choices? Check the Nutrition Facts panel of the food label. Choose foods lower in saturated fat, trans fat,  and cholesterol. For saturated fat and cholesterol, you can also use the Percent Daily Value (%DV): 5% DV or less is low, and 20% DV or more is high. (There is no %DV for trans fat.) Use the Nutrition Facts panel to choose foods low in saturated fat and cholesterol, and if the trans fat is not listed, read the ingredients and limit products that list shortening or hydrogenated or partially hydrogenated vegetable oil, which tend to be high in trans fat. POINTS TO REMEMBER:   Discuss your risk for heart disease with your caregivers, and take steps to reduce risk factors.  Change your diet. Choose foods that are low in saturated fat, trans fat, and cholesterol.  Add exercise to your daily routine if it is not already being done. Participate in physical activity of moderate intensity, like brisk walking, for at least 30 minutes on most, and preferably all days of the week. No time? Break the 30 minutes into three, 10-minute segments during the day.  Stop smoking. If you do smoke, contact your caregiver to discuss ways in which they can help you quit.  Do not use street drugs.  Maintain a normal weight.  Maintain a healthy blood pressure.  Keep up with your blood work for checking the fats in your blood as directed by your caregiver. Document Released: 12/30/2003 Document Revised: 09/12/2011 Document Reviewed:  07/27/2008 [facility] Patient Information 2014 [place].

## 2013-04-12 LAB — LIPID PANEL
Chol/HDL Ratio: 2.2 ratio units (ref 0.0–5.0)
Cholesterol, Total: 165 mg/dL (ref 100–199)
HDL: 74 mg/dL (ref >39)
LDL Calculated: 81 mg/dL (ref 0–99)
Triglycerides: 51 mg/dL (ref 0–149)
VLDL Cholesterol Cal: 10 mg/dL (ref 5–40)

## 2013-04-12 LAB — CMP14+EGFR
ALT: 24 IU/L (ref 0–44)
AST: 23 IU/L (ref 0–40)
Albumin/Globulin Ratio: 2 (ref 1.1–2.5)
Albumin: 4.6 g/dL (ref 3.6–4.8)
Alkaline Phosphatase: 49 IU/L (ref 39–117)
BUN/Creatinine Ratio: 16 (ref 10–22)
BUN: 15 mg/dL (ref 8–27)
CO2: 24 mmol/L (ref 18–29)
Calcium: 8.9 mg/dL (ref 8.6–10.2)
Chloride: 103 mmol/L (ref 97–108)
Creatinine, Ser: 0.93 mg/dL (ref 0.76–1.27)
GFR calc Af Amer: 99 mL/min/{1.73_m2} (ref >59)
GFR calc non Af Amer: 85 mL/min/{1.73_m2} (ref >59)
Globulin, Total: 2.3 g/dL (ref 1.5–4.5)
Glucose: 95 mg/dL (ref 65–99)
Potassium: 4.6 mmol/L (ref 3.5–5.2)
Sodium: 143 mmol/L (ref 134–144)
Total Bilirubin: 0.4 mg/dL (ref 0.0–1.2)
Total Protein: 6.9 g/dL (ref 6.0–8.5)

## 2013-05-08 ENCOUNTER — Ambulatory Visit (INDEPENDENT_AMBULATORY_CARE_PROVIDER_SITE_OTHER): Payer: Medicare Other | Admitting: Family Medicine

## 2013-05-08 ENCOUNTER — Encounter: Payer: Self-pay | Admitting: Family Medicine

## 2013-05-08 VITALS — BP 133/78 | HR 77 | Temp 97.0°F | Ht 68.0 in | Wt 171.2 lb

## 2013-05-08 DIAGNOSIS — I10 Essential (primary) hypertension: Secondary | ICD-10-CM

## 2013-05-08 DIAGNOSIS — K219 Gastro-esophageal reflux disease without esophagitis: Secondary | ICD-10-CM | POA: Diagnosis not present

## 2013-05-08 DIAGNOSIS — E785 Hyperlipidemia, unspecified: Secondary | ICD-10-CM

## 2013-05-08 NOTE — Progress Notes (Signed)
Patient ID: Logan Price, male   DOB: Jul 24, 1945, 68 y.o.   MRN: [id number] SUBJECTIVE: CC: Chief Complaint  Patient presents with  . Follow-up    RECEIVED PAPER FROM [facility]     HPI: Risks of  Drug interaction with simvastatin 40 and amlodipine. Has done well for a while without problems.  Past Medical History  Diagnosis Date  . Diverticulitis   . HTN (hypertension)   . Hyperlipidemia    No past surgical history on file. History   Social History  . Marital Status: Married    Spouse Name: N/A    Number of Children: N/A  . Years of Education: N/A   Occupational History  . Not on file.   Social History Main Topics  . Smoking status: Former Smoker    Quit date: 10/09/2006  . Smokeless tobacco: Not on file  . Alcohol Use: No  . Drug Use: No  . Sexual Activity: Not on file   Other Topics Concern  . Not on file   Social History Narrative  . No narrative on file   No family history on file. Current Outpatient Prescriptions on File Prior to Visit  Medication Sig Dispense Refill  . amLODipine (NORVASC) 10 MG tablet Take 1 tablet (10 mg total) by mouth daily.  90 tablet  3  . multivitamin (THERAGRAN) per tablet Take 1 tablet by mouth daily.        . Omeprazole Magnesium (PRILOSEC OTC PO) Take by mouth. OTC       . doxycycline (VIBRA-TABS) 100 MG tablet Take 1 tablet (100 mg total) by mouth 2 (two) times daily.  20 tablet  0   No current facility-administered medications on file prior to visit.   No Known Allergies Immunization History  Administered Date(s) Administered  . Tdap 03/04/2010   Prior to Admission medications   Medication Sig Start Date End Date Taking? Authorizing Provider  amLODipine (NORVASC) 10 MG tablet Take 1 tablet (10 mg total) by mouth daily. 10/08/12   Ileana Ladd, MD  doxycycline (VIBRA-TABS) 100 MG tablet Take 1 tablet (100 mg total) by mouth 2 (two) times daily. 11/08/12   Deatra Canter, FNP  multivitamin  [facility]) per tablet Take 1 tablet by mouth daily.      Historical Provider, MD  Omeprazole Magnesium (PRILOSEC OTC PO) Take by mouth. OTC     Historical Provider, MD  simvastatin (ZOCOR) 40 MG tablet Take 1 tablet (40 mg total) by mouth at bedtime. 12/06/12   Ileana Ladd, MD     ROS: As above in the HPI. All other systems are stable or negative.  OBJECTIVE: APPEARANCE:  Patient in no acute distress.The patient appeared well nourished and normally developed. Acyanotic. Waist: VITAL SIGNS:BP 133/78  Pulse 77  Temp(Src) 97 F (36.1 C) (Oral)  Ht 5\' 8"  (1.727 m)  Wt 171 lb 3.2 oz (77.656 kg)  BMI 26.04 kg/m2  AAM looks well SKIN: warm and  Dry without overt rashes, tattoos and scars  HEAD and Neck: without JVD, Head and scalp: normal Eyes:No scleral icterus. Fundi normal, eye movements normal. Ears: Auricle normal, canal normal, Tympanic membranes normal, insufflation normal. Nose: normal Throat: normal Neck & thyroid: normal  CHEST & LUNGS: Chest wall: normal Lungs: Clear  CVS: Reveals the PMI to be normally located. Regular rhythm, First and Second Heart sounds are normal,  absence of murmurs, rubs or gallops. Peripheral vasculature: Radial pulses: normal Dorsal pedis pulses: normal Posterior pulses: normal  ABDOMEN:  Appearance: normal Benign, no organomegaly, no masses, no Abdominal Aortic enlargement. No Guarding , no rebound. No Bruits. Bowel sounds: normal  RECTAL: N/A GU: N/A  EXTREMETIES: nonedematous.  MUSCULOSKELETAL:  Spine: normal Joints: intact  NEUROLOGIC: oriented to time,place and person; nonfocal. Strength is normal Sensory is normal Reflexes are normal Cranial Nerves are normal. Results for orders placed in visit on 04/11/13  [id number]      Result Value Ref Range   Glucose 95  65 - 99 mg/dL   BUN 15  8 - 27 mg/dL   Creatinine, Ser 7.82  0.76 - 1.27 mg/dL   GFR calc non Af Amer 85  >59 mL/min/1.73   GFR calc Af Amer 99  >59  mL/min/1.73   BUN/Creatinine Ratio 16  10 - 22   Sodium 143  134 - 144 mmol/L   Potassium 4.6  3.5 - 5.2 mmol/L   Chloride 103  97 - 108 mmol/L   CO2 24  18 - 29 mmol/L   Calcium 8.9  8.6 - 10.2 mg/dL   Total Protein 6.9  6.0 - 8.5 g/dL   Albumin 4.6  3.6 - 4.8 g/dL   Globulin, Total 2.3  1.5 - 4.5 g/dL   Albumin/Globulin Ratio 2.0  1.1 - 2.5   Total Bilirubin 0.4  0.0 - 1.2 mg/dL   Alkaline Phosphatase 49  39 - 117 IU/L   AST 23  0 - 40 IU/L   ALT 24  0 - 44 IU/L  LIPID PANEL      Result Value Ref Range   Cholesterol, Total 165  100 - 199 mg/dL   Triglycerides 51  0 - 149 mg/dL   HDL 74  >95 mg/dL   VLDL Cholesterol Cal 10  5 - 40 mg/dL   LDL Calculated 81  0 - 99 mg/dL   Chol/HDL Ratio 2.2  0.0 - 5.0 ratio units    ASSESSMENT: HLD (hyperlipidemia) - Plan: simvastatin (ZOCOR) 40 MG tablet  HTN (hypertension)  GERD (gastroesophageal reflux disease)  PLAN: Discussed with him risks. He was on pravastatin before and the insurance changed him to simvastatin. Now he opted to reduce the simvastatin to 20 mg. We will see the effect in 3 months. No orders of the defined types were placed in this encounter.   Meds ordered this encounter  Medications  . simvastatin (ZOCOR) 40 MG tablet    Sig: Take 0.5 tablets (20 mg total) by mouth at bedtime.    Dispense:  90 tablet    Refill:  3   Medications Discontinued During This Encounter  Medication Reason  . simvastatin (ZOCOR) 40 MG tablet Reorder   Return in about 3 months (around 08/05/2013) for Recheck medical problems; cholesterol.  Jayne Peckenpaugh P. [place] Charon, M.D.

## 2013-07-03 ENCOUNTER — Encounter: Payer: Self-pay | Admitting: *Deleted

## 2013-08-08 ENCOUNTER — Ambulatory Visit: Payer: Medicare Other | Admitting: General Practice

## 2013-08-15 ENCOUNTER — Ambulatory Visit: Payer: Medicare Other | Admitting: General Practice

## 2013-08-15 ENCOUNTER — Ambulatory Visit: Payer: Medicare Other | Admitting: Nurse Practitioner

## 2013-09-03 ENCOUNTER — Ambulatory Visit (INDEPENDENT_AMBULATORY_CARE_PROVIDER_SITE_OTHER): Payer: Medicare Other | Admitting: Family

## 2013-09-03 ENCOUNTER — Encounter: Payer: Self-pay | Admitting: Family

## 2013-09-03 VITALS — BP 128/70 | HR 72 | Temp 97.8°F | Ht 68.0 in | Wt 170.0 lb

## 2013-09-03 DIAGNOSIS — Z23 Encounter for immunization: Secondary | ICD-10-CM

## 2013-09-03 DIAGNOSIS — I10 Essential (primary) hypertension: Secondary | ICD-10-CM

## 2013-09-03 DIAGNOSIS — E785 Hyperlipidemia, unspecified: Secondary | ICD-10-CM | POA: Diagnosis not present

## 2013-09-03 DIAGNOSIS — Z13228 Encounter for screening for other metabolic disorders: Secondary | ICD-10-CM | POA: Diagnosis not present

## 2013-09-03 DIAGNOSIS — Z1321 Encounter for screening for nutritional disorder: Secondary | ICD-10-CM

## 2013-09-03 DIAGNOSIS — E559 Vitamin D deficiency, unspecified: Secondary | ICD-10-CM | POA: Diagnosis not present

## 2013-09-03 DIAGNOSIS — Z130 Encounter for screening for diseases of the blood and blood-forming organs and certain disorders involving the immune mechanism: Secondary | ICD-10-CM | POA: Diagnosis not present

## 2013-09-03 MED ORDER — AMLODIPINE BESYLATE 10 MG PO TABS: 10.0000 mg | ORAL_TABLET | Freq: Every day | ORAL | Status: DC

## 2013-09-03 MED ORDER — SIMVASTATIN 20 MG PO TABS: 20.0000 mg | ORAL_TABLET | Freq: Every day | ORAL | Status: DC

## 2013-09-03 MED ORDER — SIMVASTATIN 40 MG PO TABS: 20.0000 mg | ORAL_TABLET | Freq: Every day | ORAL | Status: DC

## 2013-09-03 NOTE — Patient Instructions (Addendum)
Health Maintenance, Males A healthy lifestyle and preventative care can promote health and wellness.  Maintain regular health, dental, and eye exams.  Eat a healthy diet. Foods like vegetables, fruits, whole grains, low-fat dairy products, and lean protein foods contain the nutrients you need and are low in calories. Decrease your intake of foods high in solid fats, added sugars, and salt. Get information about a proper diet from your health care provider, if necessary.  Regular physical exercise is one of the most important things you can do for your health. Most adults should get at least 150 minutes of moderate-intensity exercise (any activity that increases your heart rate and causes you to sweat) each week. In addition, most adults need muscle-strengthening exercises on 2 or more days a week.   Maintain a healthy weight. The body mass index (BMI) is a screening tool to identify possible weight problems. It provides an estimate of body fat based on height and weight. Your health care provider can find your BMI and can help you achieve or maintain a healthy weight. For males 20 years and older:  A BMI below 18.5 is considered underweight.  A BMI of 18.5 to 24.9 is normal.  A BMI of 25 to 29.9 is considered overweight.  A BMI of 30 and above is considered obese.  Maintain normal blood lipids and cholesterol by exercising and minimizing your intake of saturated fat. Eat a balanced diet with plenty of fruits and vegetables. Blood tests for lipids and cholesterol should begin at age 20 and be repeated every 5 years. If your lipid or cholesterol levels are high, you are over 50, or you are at high risk for heart disease, you may need your cholesterol levels checked more frequently.Ongoing high lipid and cholesterol levels should be treated with medicines, if diet and exercise are not working.  If you smoke, find out from your health care provider how to quit. If you do not use tobacco, do not  start.  Lung cancer screening is recommended for adults aged 55 80 years who are at high risk for developing lung cancer because of a history of smoking. A yearly low-dose CT scan of the lungs is recommended for people who have at least a 30-pack-year history of smoking and are a current smoker or have quit within the past 15 years. A pack year of smoking is smoking an average of 1 pack of cigarettes a day for 1 year (for example, a 30-pack-year history of smoking could mean smoking 1 pack a day for 30 years or 2 packs a day for 15 years). Yearly screening should continue until the smoker has stopped smoking for at least 15 years. Yearly screening should be stopped for people who develop a health problem that would prevent them from having lung cancer treatment.  If you choose to drink alcohol, do not have more than 2 drinks per day. One drink is considered to be 12 oz (360 mL) of beer, 5 oz (150 mL) of wine, or 1.5 oz (45 mL) of liquor.  Avoid use of street drugs. Do not share needles with anyone. Ask for help if you need support or instructions about stopping the use of drugs.  High blood pressure causes heart disease and increases the risk of stroke. Blood pressure should be checked at least every 1 2 years. Ongoing high blood pressure should be treated with medicines if weight loss and exercise are not effective.  If you are 67 67 years old, ask your health   care provider if you should take aspirin to prevent heart disease.  Diabetes screening involves taking a blood sample to check your fasting blood sugar level. This should be done once every 3 years after age 61, if you are at a normal weight and without risk factors for diabetes. Testing should be considered at a younger age or be carried out more frequently if you are overweight and have at least 1 risk factor for diabetes.  Colorectal cancer can be detected and often prevented. Most routine colorectal cancer screening begins at the age of 55  and continues through age 67. However, your health care provider may recommend screening at an earlier age if you have risk factors for colon cancer. On a yearly basis, your health care provider may provide home test kits to check for hidden blood in the stool. A small camera at the end of a tube may be used to directly examine the colon (sigmoidoscopy or colonoscopy) to detect the earliest forms of colorectal cancer. Talk to your health care provider about this at age 41, when routine screening begins. A direct exam of the colon should be repeated every 5 10 years through age 67, unless early forms of pre-cancerous polyps or small growths are found.  People who are at an increased risk for hepatitis B should be screened for this virus. You are considered at high risk for hepatitis B if:  You were born in a country where hepatitis B occurs often. Talk with your health care provider about which countries are considered high-risk.  Your parents were born in a high-risk country and you have not received a shot to protect against hepatitis B (hepatitis B vaccine).  You have HIV or AIDS.  You use needles to inject street drugs.  You live with, or have sex with, someone who has hepatitis B.  You are a man who has sex with other men (MSM).  You get hemodialysis treatment.  You take certain medicines for conditions like cancer, organ transplantation, and autoimmune conditions.  Hepatitis C blood testing is recommended for all people born from [birth year] through 1964 and any individual with known risk factors for hepatitis C.  Healthy men should no longer receive prostate-specific antigen (PSA) blood tests as part of routine cancer screening. Talk to your health care provider about prostate cancer screening.  Testicular cancer screening is not recommended for adolescents or adult males who have no symptoms. Screening includes self-exam, a health care provider exam, and other screening tests. Consult with  your health care provider about any symptoms you have or any concerns you have about testicular cancer.  Practice safe sex. Use condoms and avoid high-risk sexual practices to reduce the spread of sexually transmitted infections (STIs).  Use sunscreen. Apply sunscreen liberally and repeatedly throughout the day. You should seek shade when your shadow is shorter than you. Protect yourself by wearing long sleeves, pants, a wide-brimmed hat, and sunglasses year round, whenever you are outdoors.  Tell your health care provider of new moles or changes in moles, especially if there is a change in shape or color. Also tell your provider if a mole is larger than the size of a pencil eraser.  A one-time screening for abdominal aortic aneurysm (AAA) and surgical repair of large AAAs by ultrasound is recommended for men aged 77 75 years who are current or former smokers.  Stay current with your vaccines (immunizations). Document Released: 09/09/2007 Document Revised: 01/01/2013 Document Reviewed: 08/08/2010 [facility] Patient Information 2014 [place], [place].  Pneumococcal Vaccine, Polyvalent suspension for injection What is this medicine? PNEUMOCOCCAL VACCINE, POLYVALENT (NEU mo KOK al vak SEEN, pol ee VEY luhnt) is a vaccine to prevent pneumococcus bacteria infection. These bacteria are a major cause of ear infections, 'Strep throat' infections, and serious pneumonia, meningitis, or blood infections worldwide. These vaccines help the body to produce antibodies (protective substances) that help your body defend against these bacteria. This vaccine is recommended for infants and young children. This vaccine will not treat an infection. This medicine may be used for other purposes; ask your health care provider or pharmacist if you have questions. COMMON BRAND NAME(S): Prevnar 13 , Prevnar What should I tell my health care provider before I take this medicine? They need to know if you have any of these  conditions: -bleeding problems -fever -immune system problems -low platelet count in the blood -seizures -an unusual or allergic reaction to pneumococcal vaccine, diphtheria toxoid, other vaccines, latex, other medicines, foods, dyes, or preservatives -pregnant or trying to get pregnant -breast-feeding How should I use this medicine? This vaccine is for injection into a muscle. It is given by a health care professional. A copy of Vaccine Information Statements will be given before each vaccination. Read this sheet carefully each time. The sheet may change frequently. Talk to your pediatrician regarding the use of this medicine in children. While this drug may be prescribed for children as young as 22 weeks old for selected conditions, precautions do apply. Overdosage: If you think you have taken too much of this medicine contact a poison control center or emergency room at once. NOTE: This medicine is only for you. Do not share this medicine with others. What if I miss a dose? It is important not to miss your dose. Call your doctor or health care professional if you are unable to keep an appointment. What may interact with this medicine? -medicines for cancer chemotherapy -medicines that suppress your immune function -medicines that treat or prevent blood clots like warfarin, enoxaparin, and dalteparin -steroid medicines like prednisone or cortisone This list may not describe all possible interactions. Give your health care provider a list of all the medicines, herbs, non-prescription drugs, or dietary supplements you use. Also tell them if you smoke, drink alcohol, or use illegal drugs. Some items may interact with your medicine. What should I watch for while using this medicine? Mild fever and pain should go away in 3 days or less. Report any unusual symptoms to your doctor or health care professional. What side effects may I notice from receiving this medicine? Side effects that you  should report to your doctor or health care professional as soon as possible: -allergic reactions like skin rash, itching or hives, swelling of the face, lips, or tongue -breathing problems -confused -fever over 102 degrees F -pain, tingling, numbness in the hands or feet -seizures -unusual bleeding or bruising -unusual muscle weakness Side effects that usually do not require medical attention (report to your doctor or health care professional if they continue or are bothersome): -aches and pains -diarrhea -fever of 102 degrees F or less -headache -irritable -loss of appetite -pain, tender at site where injected -trouble sleeping This list may not describe all possible side effects. Call your doctor for medical advice about side effects. You may report side effects to FDA at 1-800-FDA-1088. Where should I keep my medicine? This does not apply. This vaccine is given in a clinic, pharmacy, doctor's office, or other health care setting and will not be stored  at home. NOTE: This sheet is a summary. It may not cover all possible information. If you have questions about this medicine, talk to your doctor, pharmacist, or health care provider.  2014, Elsevier/Gold Standard. (2008-05-26 10:17:22)

## 2013-09-03 NOTE — Progress Notes (Signed)
Subjective:    Patient ID: Logan Price, male    DOB: Jan 06, 1946, 68 y.o.   MRN: [id number]  Hyperlipidemia This is a chronic problem. The current episode started more than 1 year ago. The problem is uncontrolled. Recent lipid tests were reviewed and are high. He has no history of diabetes or hypothyroidism. Factors aggravating his hyperlipidemia include fatty foods. Pertinent negatives include no leg pain, myalgias or shortness of breath. Current antihyperlipidemic treatment includes statins. The current treatment provides moderate improvement of lipids. Risk factors for coronary artery disease include dyslipidemia, hypertension and male sex.  Hypertension This is a chronic problem. The current episode started more than 1 year ago. The problem has been resolved since onset. The problem is controlled. Pertinent negatives include no anxiety, headaches, palpitations, peripheral edema or shortness of breath. Past treatments include calcium channel blockers. The current treatment provides significant improvement. There is no history of kidney disease, CAD/MI or a thyroid problem. There is no history of sleep apnea.      Review of Systems  HENT: Negative.   Respiratory: Negative.  Negative for shortness of breath.   Cardiovascular: Negative.  Negative for palpitations.  Genitourinary: Negative.   Musculoskeletal: Negative.  Negative for myalgias.  Neurological: Negative for headaches.  Hematological: Negative.   All other systems reviewed and are negative.      Objective:   Physical Exam  Vitals reviewed. Constitutional: He is oriented to person, place, and time. He appears well-developed and well-nourished. No distress.  HENT:  Head: Normocephalic.  Right Ear: External ear normal.  Left Ear: External ear normal.  Mouth/Throat: Oropharynx is clear and moist.  Eyes: Pupils are equal, round, and reactive to light. Right eye exhibits no discharge. Left eye exhibits no discharge.  Neck:  Normal range of motion. Neck supple. No thyromegaly present.  Cardiovascular: Normal rate, regular rhythm, normal heart sounds and intact distal pulses.   No murmur heard. Pulmonary/Chest: Effort normal and breath sounds normal. No respiratory distress. He has no wheezes.  Abdominal: Soft. Bowel sounds are normal. He exhibits no distension. There is no tenderness.  Musculoskeletal: Normal range of motion. He exhibits no edema and no tenderness.  Neurological: He is alert and oriented to person, place, and time. He has normal reflexes. No cranial nerve deficit.  Skin: Skin is warm and dry. No rash noted. No erythema.  Psychiatric: He has a normal mood and affect. His behavior is normal. Judgment and thought content normal.   BP 128/70  Pulse 72  Temp(Src) 97.8 F (36.6 C) (Oral)  Ht 5\' 8"  (1.727 m)  Wt 170 lb (77.111 kg)  BMI 25.85 kg/m2        Assessment & Plan:  1. HTN (hypertension) - amLODipine (NORVASC) 10 MG tablet; Take 1 tablet (10 mg total) by mouth daily.  Dispense: 90 tablet; Refill: 3 - CMP14+EGFR  2. Hyperlipidemia - Lipid panel  3. HLD (hyperlipidemia) - simvastatin (ZOCOR) 40 MG tablet; Take 0.5 tablets (20 mg total) by mouth at bedtime.  Dispense: 90 tablet; Refill: 3  4. Encounter for vitamin deficiency screening - Vit D  25 hydroxy (rtn osteoporosis monitoring)   Continue all meds Labs pending Health Maintenance reviewed Diet and exercise encouraged RTO 6 months  [name], FNP

## 2013-09-04 ENCOUNTER — Telehealth: Payer: Self-pay | Admitting: Family Medicine

## 2013-09-04 LAB — CMP14+EGFR
ALT: 11 IU/L (ref 0–44)
AST: 17 IU/L (ref 0–40)
Albumin/Globulin Ratio: 2.1 (ref 1.1–2.5)
Albumin: 4.8 g/dL (ref 3.6–4.8)
Alkaline Phosphatase: 52 IU/L (ref 39–117)
BUN/Creatinine Ratio: 14 (ref 10–22)
BUN: 15 mg/dL (ref 8–27)
CALCIUM: 9.2 mg/dL (ref 8.6–10.2)
CHLORIDE: 106 mmol/L (ref 97–108)
CO2: 24 mmol/L (ref 18–29)
Creatinine, Ser: 1.06 mg/dL (ref 0.76–1.27)
GFR calc Af Amer: 84 mL/min/{1.73_m2} (ref >59)
GFR calc non Af Amer: 73 mL/min/{1.73_m2} (ref >59)
Globulin, Total: 2.3 g/dL (ref 1.5–4.5)
Glucose: 93 mg/dL (ref 65–99)
POTASSIUM: 4 mmol/L (ref 3.5–5.2)
SODIUM: 144 mmol/L (ref 134–144)
Total Bilirubin: 0.4 mg/dL (ref 0.0–1.2)
Total Protein: 7.1 g/dL (ref 6.0–8.5)

## 2013-09-04 LAB — VITAMIN D 25 HYDROXY (VIT D DEFICIENCY, FRACTURES): Vit D, 25-Hydroxy: 41.8 ng/mL (ref 30.0–100.0)

## 2013-09-04 LAB — LIPID PANEL
Chol/HDL Ratio: 2.6 ratio units (ref 0.0–5.0)
Cholesterol, Total: 179 mg/dL (ref 100–199)
HDL: 68 mg/dL (ref >39)
LDL Calculated: 96 mg/dL (ref 0–99)
TRIGLYCERIDES: 73 mg/dL (ref 0–149)
VLDL Cholesterol Cal: 15 mg/dL (ref 5–40)

## 2013-09-04 NOTE — Telephone Encounter (Signed)
Message copied by Waverly Ferrari on Thu Sep 04, 2013  3:35 PM ------      Message from: Lenna Gilford, [place] A      Created: Thu Sep 04, 2013  9:17 AM       Kidney and liver function stable      Cholesterol levels normal      Vit D normal ------

## 2013-09-05 NOTE — Telephone Encounter (Signed)
Pt aware of lab results 

## 2013-10-10 ENCOUNTER — Ambulatory Visit: Payer: Medicare Other | Admitting: General Practice

## 2013-11-03 ENCOUNTER — Ambulatory Visit (INDEPENDENT_AMBULATORY_CARE_PROVIDER_SITE_OTHER): Payer: Medicare Other | Admitting: Family

## 2013-11-03 VITALS — BP 133/78 | HR 91 | Temp 98.5°F | Ht 69.0 in | Wt 165.4 lb

## 2013-11-03 DIAGNOSIS — A088 Other specified intestinal infections: Secondary | ICD-10-CM

## 2013-11-03 DIAGNOSIS — A084 Viral intestinal infection, unspecified: Secondary | ICD-10-CM

## 2013-11-03 LAB — POCT CBC
Granulocyte percent: 68.1 %G (ref 37–80)
HCT, POC: 45.5 % (ref 43.5–53.7)
HEMOGLOBIN: 14.8 g/dL (ref 14.1–18.1)
LYMPH, POC: 2.8 (ref 0.6–3.4)
MCH, POC: 29.5 pg (ref 27–31.2)
MCHC: 32.5 g/dL (ref 31.8–35.4)
MCV: 90.8 fL (ref 80–97)
MPV: 7.4 fL (ref 0–99.8)
PLATELET COUNT, POC: 238 10*3/uL (ref 142–424)
POC GRANULOCYTE: 7.3 — AB (ref 2–6.9)
POC LYMPH %: 25.9 % (ref 10–50)
RBC: 5 M/uL (ref 4.69–6.13)
RDW, POC: 14.5 %
WBC: 10.7 10*3/uL — AB (ref 4.6–10.2)

## 2013-11-03 NOTE — Progress Notes (Signed)
Subjective:    Patient ID: Logan Price, male    DOB: 10-07-1945, 68 y.o.   MRN: [id number]  Fever  This is a new problem. The current episode started yesterday. The problem occurs intermittently. The problem has been waxing and waning. The maximum temperature noted was 99 to 99.9 F. The temperature was taken using an oral thermometer. Associated symptoms include diarrhea, headaches, muscle aches and sleepiness. Pertinent negatives include no abdominal pain, chest pain, congestion, coughing, ear pain, nausea, rash, sore throat, urinary pain, vomiting or wheezing. He has tried acetaminophen for the symptoms. The treatment provided mild relief.      Review of Systems  Constitutional: Positive for fever.  HENT: Negative for congestion, ear pain and sore throat.   Respiratory: Negative.  Negative for cough and wheezing.   Cardiovascular: Negative.  Negative for chest pain.  Gastrointestinal: Positive for diarrhea. Negative for nausea, vomiting and abdominal pain.  Endocrine: Negative.   Genitourinary: Negative.   Musculoskeletal: Negative.   Skin: Negative for rash.  Neurological: Positive for headaches.  Hematological: Negative.   Psychiatric/Behavioral: Negative.   All other systems reviewed and are negative.      Objective:   Physical Exam  Vitals reviewed. Constitutional: He is oriented to person, place, and time. He appears well-developed and well-nourished. No distress.  HENT:  Head: Normocephalic.  Right Ear: External ear normal.  Left Ear: External ear normal.  Nose: Nose normal.  Mouth/Throat: Oropharynx is clear and moist.  Eyes: Pupils are equal, round, and reactive to light. Right eye exhibits no discharge. Left eye exhibits no discharge.  Neck: Normal range of motion. Neck supple. No thyromegaly present.  Cardiovascular: Normal rate, regular rhythm, normal heart sounds and intact distal pulses.   No murmur heard. Pulmonary/Chest: Effort normal and breath sounds  normal. No respiratory distress. He has no wheezes.  Abdominal: Soft. Bowel sounds are normal. He exhibits no distension. There is no tenderness.  Musculoskeletal: Normal range of motion. He exhibits no edema and no tenderness.  Neurological: He is alert and oriented to person, place, and time. He has normal reflexes. No cranial nerve deficit.  Skin: Skin is warm and dry. No rash noted. No erythema.  Psychiatric: He has a normal mood and affect. His behavior is normal. Judgment and thought content normal.      BP 133/78  Pulse 91  Temp(Src) 98.5 F (36.9 C) (Oral)  Ht 5\' 9"  (1.753 m)  Wt 165 lb 6.4 oz (75.025 kg)  BMI 24.41 kg/m2     Assessment & Plan:  1. Viral gastroenteritis -Tylenol prn for pain and fever -Force Fluids -Imodium prn for diarrhea -Rest - POCT CBC  [name], FNP

## 2013-11-03 NOTE — Patient Instructions (Signed)

## 2014-03-06 ENCOUNTER — Ambulatory Visit: Payer: Medicare Other | Admitting: Family

## 2014-04-06 ENCOUNTER — Encounter: Payer: Self-pay | Admitting: Family

## 2014-04-06 ENCOUNTER — Ambulatory Visit (INDEPENDENT_AMBULATORY_CARE_PROVIDER_SITE_OTHER): Payer: Medicare Other | Admitting: Family

## 2014-04-06 VITALS — BP 134/81 | HR 74 | Temp 96.8°F | Ht 69.0 in | Wt 164.2 lb

## 2014-04-06 DIAGNOSIS — E559 Vitamin D deficiency, unspecified: Secondary | ICD-10-CM | POA: Diagnosis not present

## 2014-04-06 DIAGNOSIS — M25511 Pain in right shoulder: Secondary | ICD-10-CM

## 2014-04-06 DIAGNOSIS — I10 Essential (primary) hypertension: Secondary | ICD-10-CM | POA: Diagnosis not present

## 2014-04-06 DIAGNOSIS — K219 Gastro-esophageal reflux disease without esophagitis: Secondary | ICD-10-CM | POA: Diagnosis not present

## 2014-04-06 DIAGNOSIS — E785 Hyperlipidemia, unspecified: Secondary | ICD-10-CM | POA: Diagnosis not present

## 2014-04-06 MED ORDER — CYCLOBENZAPRINE HCL 5 MG PO TABS: 5.0000 mg | ORAL_TABLET | Freq: Three times a day (TID) | ORAL | Status: DC | PRN

## 2014-04-06 MED ORDER — NAPROXEN 500 MG PO TABS: 500.0000 mg | ORAL_TABLET | Freq: Two times a day (BID) | ORAL | Status: DC

## 2014-04-06 NOTE — Progress Notes (Signed)
Subjective:    Patient ID: Logan Price, male    DOB: [birth year], 69 y.o.   MRN: [id number]  Hypertension This is a chronic problem. The current episode started more than 1 year ago. The problem has been resolved since onset. The problem is controlled. Pertinent negatives include no anxiety, headaches, palpitations, peripheral edema or shortness of breath. Past treatments include calcium channel blockers. The current treatment provides significant improvement. There is no history of kidney disease, CAD/MI, heart failure or a thyroid problem. There is no history of sleep apnea.  Hyperlipidemia This is a chronic problem. The current episode started more than 1 year ago. The problem is uncontrolled. Recent lipid tests were reviewed and are high. He has no history of diabetes or hypothyroidism. Factors aggravating his hyperlipidemia include fatty foods. Pertinent negatives include no leg pain, myalgias or shortness of breath. Current antihyperlipidemic treatment includes statins. The current treatment provides moderate improvement of lipids. Risk factors for coronary artery disease include dyslipidemia, hypertension and male sex.  Shoulder Pain  The pain is present in the right shoulder and neck. This is a new problem. The current episode started 1 to 4 weeks ago. The problem occurs intermittently. The problem has been waxing and waning. The quality of the pain is described as sharp. The pain is at a severity of 7/10. The pain is mild. Pertinent negatives include no numbness or stiffness. He has tried NSAIDS for the symptoms. The treatment provided mild relief. There is no history of diabetes.      Review of Systems  Constitutional: Negative.   HENT: Negative.   Respiratory: Negative.  Negative for shortness of breath.   Cardiovascular: Negative.  Negative for palpitations.  Gastrointestinal: Negative.   Endocrine: Negative.   Genitourinary: Negative.   Musculoskeletal: Negative.  Negative for  myalgias and stiffness.  Neurological: Negative.  Negative for numbness and headaches.  Hematological: Negative.   Psychiatric/Behavioral: Negative.   All other systems reviewed and are negative.      Objective:   Physical Exam  Constitutional: He is oriented to person, place, and time. He appears well-developed and well-nourished. No distress.  HENT:  Head: Normocephalic.  Right Ear: External ear normal.  Left Ear: External ear normal.  Nose: Nose normal.  Mouth/Throat: Oropharynx is clear and moist.  Eyes: Pupils are equal, round, and reactive to light. Right eye exhibits no discharge. Left eye exhibits no discharge.  Neck: Normal range of motion. Neck supple. No thyromegaly present.  Cardiovascular: Normal rate, regular rhythm, normal heart sounds and intact distal pulses.   No murmur heard. Pulmonary/Chest: Effort normal and breath sounds normal. No respiratory distress. He has no wheezes.  Abdominal: Soft. Bowel sounds are normal. He exhibits no distension. There is no tenderness.  Musculoskeletal: Normal range of motion. He exhibits no edema or tenderness.  Neurological: He is alert and oriented to person, place, and time. He has normal reflexes. No cranial nerve deficit.  Skin: Skin is warm and dry. No rash noted. No erythema.  Psychiatric: He has a normal mood and affect. His behavior is normal. Judgment and thought content normal.  Vitals reviewed.   BP 134/81 mmHg  Pulse 74  Temp(Src) 96.8 F (36 C) (Oral)  Ht 5\' 9"  (1.753 m)  Wt 164 lb 3.2 oz (74.481 kg)  BMI 24.24 kg/m2       Assessment & Plan:  1. Essential hypertension - CMP14+EGFR  2. Gastroesophageal reflux disease, esophagitis presence not specified - CMP14+EGFR  3. Hyperlipidemia - Lipid  panel  4. Right shoulder pain -Rest -Ice -Sedation precautions discussed and taking naproxen with food and avoid other NSAIDs - cyclobenzaprine (FLEXERIL) 5 MG tablet; Take 1 tablet (5 mg total) by mouth 3  (three) times daily as needed for muscle spasms.  Dispense: 30 tablet; Refill: 0 - naproxen (NAPROSYN) 500 MG tablet; Take 1 tablet (500 mg total) by mouth 2 (two) times daily with a meal.  Dispense: 30 tablet; Refill: 0   Continue all meds Labs pending Health Maintenance reviewed Diet and exercise encouraged RTO 6 months  [name], FNP

## 2014-04-06 NOTE — Patient Instructions (Signed)
Health Maintenance A healthy lifestyle and preventative care can promote health and wellness.  Maintain regular health, dental, and eye exams.  Eat a healthy diet. Foods like vegetables, fruits, whole grains, low-fat dairy products, and lean protein foods contain the nutrients you need and are low in calories. Decrease your intake of foods high in solid fats, added sugars, and salt. Get information about a proper diet from your health care provider, if necessary.  Regular physical exercise is one of the most important things you can do for your health. Most adults should get at least 150 minutes of moderate-intensity exercise (any activity that increases your heart rate and causes you to sweat) each week. In addition, most adults need muscle-strengthening exercises on 2 or more days a week.   Maintain a healthy weight. The body mass index (BMI) is a screening tool to identify possible weight problems. It provides an estimate of body fat based on height and weight. Your health care provider can find your BMI and can help you achieve or maintain a healthy weight. For males 20 years and older:  A BMI below 18.5 is considered underweight.  A BMI of 18.5 to 24.9 is normal.  A BMI of 25 to 29.9 is considered overweight.  A BMI of 30 and above is considered obese.  Maintain normal blood lipids and cholesterol by exercising and minimizing your intake of saturated fat. Eat a balanced diet with plenty of fruits and vegetables. Blood tests for lipids and cholesterol should begin at age 20 and be repeated every 5 years. If your lipid or cholesterol levels are high, you are over age 50, or you are at high risk for heart disease, you may need your cholesterol levels checked more frequently.Ongoing high lipid and cholesterol levels should be treated with medicines if diet and exercise are not working.  If you smoke, find out from your health care provider how to quit. If you do not use tobacco, do not  start.  Lung cancer screening is recommended for adults aged 55-80 years who are at high risk for developing lung cancer because of a history of smoking. A yearly low-dose CT scan of the lungs is recommended for people who have at least a 30-pack-year history of smoking and are current smokers or have quit within the past 15 years. A pack year of smoking is smoking an average of 1 pack of cigarettes a day for 1 year (for example, a 30-pack-year history of smoking could mean smoking 1 pack a day for 30 years or 2 packs a day for 15 years). Yearly screening should continue until the smoker has stopped smoking for at least 15 years. Yearly screening should be stopped for people who develop a health problem that would prevent them from having lung cancer treatment.  If you choose to drink alcohol, do not have more than 2 drinks per day. One drink is considered to be 12 oz (360 mL) of beer, 5 oz (150 mL) of wine, or 1.5 oz (45 mL) of liquor.  Avoid the use of street drugs. Do not share needles with anyone. Ask for help if you need support or instructions about stopping the use of drugs.  High blood pressure causes heart disease and increases the risk of stroke. Blood pressure should be checked at least every 1-2 years. Ongoing high blood pressure should be treated with medicines if weight loss and exercise are not effective.  If you are 45-79 years old, ask your health care provider if   you should take aspirin to prevent heart disease.  Diabetes screening involves taking a blood sample to check your fasting blood sugar level. This should be done once every 3 years after age 45 if you are at a normal weight and without risk factors for diabetes. Testing should be considered at a younger age or be carried out more frequently if you are overweight and have at least 1 risk factor for diabetes.  Colorectal cancer can be detected and often prevented. Most routine colorectal cancer screening begins at the age of 50  and continues through age 75. However, your health care provider may recommend screening at an earlier age if you have risk factors for colon cancer. On a yearly basis, your health care provider may provide home test kits to check for hidden blood in the stool. A small camera at the end of a tube may be used to directly examine the colon (sigmoidoscopy or colonoscopy) to detect the earliest forms of colorectal cancer. Talk to your health care provider about this at age 50 when routine screening begins. A direct exam of the colon should be repeated every 5-10 years through age 75, unless early forms of precancerous polyps or small growths are found.  People who are at an increased risk for hepatitis B should be screened for this virus. You are considered at high risk for hepatitis B if:  You were born in a country where hepatitis B occurs often. Talk with your health care provider about which countries are considered high risk.  Your parents were born in a high-risk country and you have not received a shot to protect against hepatitis B (hepatitis B vaccine).  You have HIV or AIDS.  You use needles to inject street drugs.  You live with, or have sex with, someone who has hepatitis B.  You are a man who has sex with other men (MSM).  You get hemodialysis treatment.  You take certain medicines for conditions like cancer, organ transplantation, and autoimmune conditions.  Hepatitis C blood testing is recommended for all people born from 1945 through 1965 and any individual with known risk factors for hepatitis C.  Healthy men should no longer receive prostate-specific antigen (PSA) blood tests as part of routine cancer screening. Talk to your health care provider about prostate cancer screening.  Testicular cancer screening is not recommended for adolescents or adult males who have no symptoms. Screening includes self-exam, a health care provider exam, and other screening tests. Consult with your  health care provider about any symptoms you have or any concerns you have about testicular cancer.  Practice safe sex. Use condoms and avoid high-risk sexual practices to reduce the spread of sexually transmitted infections (STIs).  You should be screened for STIs, including gonorrhea and chlamydia if:  You are sexually active and are younger than 24 years.  You are older than 24 years, and your health care provider tells you that you are at risk for this type of infection.  Your sexual activity has changed since you were last screened, and you are at an increased risk for chlamydia or gonorrhea. Ask your health care provider if you are at risk.  If you are at risk of being infected with HIV, it is recommended that you take a prescription medicine daily to prevent HIV infection. This is called pre-exposure prophylaxis (PrEP). You are considered at risk if:  You are a man who has sex with other men (MSM).  You are a heterosexual man who   is sexually active with multiple partners.  You take drugs by injection.  You are sexually active with a partner who has HIV.  Talk with your health care provider about whether you are at high risk of being infected with HIV. If you choose to begin PrEP, you should first be tested for HIV. You should then be tested every 3 months for as long as you are taking PrEP.  Use sunscreen. Apply sunscreen liberally and repeatedly throughout the day. You should seek shade when your shadow is shorter than you. Protect yourself by wearing long sleeves, pants, a wide-brimmed hat, and sunglasses year round whenever you are outdoors.  Tell your health care provider of new moles or changes in moles, especially if there is a change in shape or color. Also, tell your health care provider if a mole is larger than the size of a pencil eraser.  A one-time screening for abdominal aortic aneurysm (AAA) and surgical repair of large AAAs by ultrasound is recommended for men aged  55-75 years who are current or former smokers.  Stay current with your vaccines (immunizations). Document Released: 09/09/2007 Document Revised: 03/18/2013 Document Reviewed: 08/08/2010 [facility] Patient Information 2015 [place], [place]. This information is not intended to replace advice given to you by your health care provider. Make sure you discuss any questions you have with your health care provider. Shoulder Pain The shoulder is the joint that connects your arms to your body. The bones that form the shoulder joint include the upper arm bone (humerus), the shoulder blade (scapula), and the collarbone (clavicle). The top of the humerus is shaped like a ball and fits into a rather flat socket on the scapula (glenoid cavity). A combination of muscles and strong, fibrous tissues that connect muscles to bones (tendons) support your shoulder joint and hold the ball in the socket. Small, fluid-filled sacs (bursae) are located in different areas of the joint. They act as cushions between the bones and the overlying soft tissues and help reduce friction between the gliding tendons and the bone as you move your arm. Your shoulder joint allows a wide range of motion in your arm. This range of motion allows you to do things like scratch your back or throw a ball. However, this range of motion also makes your shoulder more prone to pain from overuse and injury. Causes of shoulder pain can originate from both injury and overuse and usually can be grouped in the following four categories:  Redness, swelling, and pain (inflammation) of the tendon (tendinitis) or the bursae (bursitis).  Instability, such as a dislocation of the joint.  Inflammation of the joint (arthritis).  Broken bone (fracture). HOME CARE INSTRUCTIONS   Apply ice to the sore area.  Put ice in a plastic bag.  Place a towel between your skin and the bag.  Leave the ice on for 15-20 minutes, 3-4 times per day for the first 2 days, or as  directed by your health care provider.  Stop using cold packs if they do not help with the pain.  If you have a shoulder sling or immobilizer, wear it as long as your caregiver instructs. Only remove it to shower or bathe. Move your arm as little as possible, but keep your hand moving to prevent swelling.  Squeeze a soft ball or foam pad as much as possible to help prevent swelling.  Only take over-the-counter or prescription medicines for pain, discomfort, or fever as directed by your caregiver. SEEK MEDICAL CARE IF:  Your shoulder pain increases, or new pain develops in your arm, hand, or fingers.  Your hand or fingers become cold and numb.  Your pain is not relieved with medicines. SEEK IMMEDIATE MEDICAL CARE IF:   Your arm, hand, or fingers are numb or tingling.  Your arm, hand, or fingers are significantly swollen or turn white or blue. MAKE SURE YOU:   Understand these instructions.  Will watch your condition.  Will get help right away if you are not doing well or get worse. Document Released: 12/21/2004 Document Revised: 07/28/2013 Document Reviewed: 02/25/2011 [facility] Patient Information 2015 [place], [place]. This information is not intended to replace advice given to you by your health care provider. Make sure you discuss any questions you have with your health care provider.

## 2014-04-07 LAB — CMP14+EGFR
ALBUMIN: 4.6 g/dL (ref 3.6–4.8)
ALK PHOS: 51 IU/L (ref 39–117)
ALT: 14 IU/L (ref 0–44)
AST: 17 IU/L (ref 0–40)
Albumin/Globulin Ratio: 1.8 (ref 1.1–2.5)
BUN / CREAT RATIO: 10 (ref 10–22)
BUN: 12 mg/dL (ref 8–27)
CHLORIDE: 105 mmol/L (ref 97–108)
CO2: 26 mmol/L (ref 18–29)
Calcium: 9.3 mg/dL (ref 8.6–10.2)
Creatinine, Ser: 1.16 mg/dL (ref 0.76–1.27)
GFR calc Af Amer: 75 mL/min/{1.73_m2} (ref >59)
GFR calc non Af Amer: 65 mL/min/{1.73_m2} (ref >59)
Globulin, Total: 2.6 g/dL (ref 1.5–4.5)
Glucose: 97 mg/dL (ref 65–99)
Potassium: 4.2 mmol/L (ref 3.5–5.2)
Sodium: 144 mmol/L (ref 134–144)
Total Bilirubin: 0.4 mg/dL (ref 0.0–1.2)
Total Protein: 7.2 g/dL (ref 6.0–8.5)

## 2014-04-07 LAB — LIPID PANEL
CHOL/HDL RATIO: 2.2 ratio (ref 0.0–5.0)
Cholesterol, Total: 163 mg/dL (ref 100–199)
HDL: 74 mg/dL (ref >39)
LDL Calculated: 79 mg/dL (ref 0–99)
TRIGLYCERIDES: 48 mg/dL (ref 0–149)
VLDL Cholesterol Cal: 10 mg/dL (ref 5–40)

## 2014-04-07 LAB — VITAMIN D 25 HYDROXY (VIT D DEFICIENCY, FRACTURES): VIT D 25 HYDROXY: 47.3 ng/mL (ref 30.0–100.0)

## 2014-04-08 ENCOUNTER — Telehealth: Payer: Self-pay | Admitting: *Deleted

## 2014-04-08 NOTE — Telephone Encounter (Signed)
Christy, I did a prior authorization on cyclobenzaprine and got a reply back saying ii needed no PA it is not covered period, so don't know if there is any use trying further, let me know if you want me to continue the battle.

## 2014-04-09 MED ORDER — BACLOFEN 10 MG PO TABS: 10.0000 mg | ORAL_TABLET | Freq: Three times a day (TID) | ORAL | Status: DC

## 2014-04-09 NOTE — Telephone Encounter (Signed)
Flexeril order d/c r/t not being covered by insurance and a new rx of baclofen 10 mg sent.

## 2014-04-18 ENCOUNTER — Other Ambulatory Visit: Payer: Self-pay | Admitting: Family

## 2014-08-21 ENCOUNTER — Other Ambulatory Visit: Payer: Self-pay | Admitting: Family

## 2014-08-21 NOTE — Telephone Encounter (Signed)
Last seen 04/06/14  Christy last lipid 1/11/6  Requesting 90 day supply

## 2014-10-06 ENCOUNTER — Ambulatory Visit: Payer: Medicare Other | Admitting: Family

## 2014-10-08 ENCOUNTER — Emergency Department (HOSPITAL_COMMUNITY): Payer: Medicare Other

## 2014-10-08 ENCOUNTER — Emergency Department (HOSPITAL_COMMUNITY)
Admission: EM | Admit: 2014-10-08 | Discharge: 2014-10-08 | Disposition: A | Discharge status: Home / Self Care | Payer: Medicare Other | Attending: Emergency Medicine | Admitting: Emergency Medicine

## 2014-10-08 ENCOUNTER — Encounter (HOSPITAL_COMMUNITY): Payer: Self-pay | Admitting: *Deleted

## 2014-10-08 DIAGNOSIS — Z8719 Personal history of other diseases of the digestive system: Secondary | ICD-10-CM | POA: Diagnosis not present

## 2014-10-08 DIAGNOSIS — E785 Hyperlipidemia, unspecified: Secondary | ICD-10-CM | POA: Diagnosis not present

## 2014-10-08 DIAGNOSIS — Z79899 Other long term (current) drug therapy: Secondary | ICD-10-CM | POA: Insufficient documentation

## 2014-10-08 DIAGNOSIS — M7022 Olecranon bursitis, left elbow: Secondary | ICD-10-CM | POA: Insufficient documentation

## 2014-10-08 DIAGNOSIS — Y9389 Activity, other specified: Secondary | ICD-10-CM | POA: Diagnosis not present

## 2014-10-08 DIAGNOSIS — Z87891 Personal history of nicotine dependence: Secondary | ICD-10-CM | POA: Insufficient documentation

## 2014-10-08 DIAGNOSIS — I10 Essential (primary) hypertension: Secondary | ICD-10-CM | POA: Diagnosis not present

## 2014-10-08 DIAGNOSIS — M25422 Effusion, left elbow: Secondary | ICD-10-CM | POA: Diagnosis present

## 2014-10-08 DIAGNOSIS — M719 Bursopathy, unspecified: Secondary | ICD-10-CM | POA: Diagnosis not present

## 2014-10-08 DIAGNOSIS — M7989 Other specified soft tissue disorders: Secondary | ICD-10-CM | POA: Diagnosis not present

## 2014-10-08 MED ORDER — DICLOFENAC SODIUM 50 MG PO TBEC: 50.0000 mg | DELAYED_RELEASE_TABLET | Freq: Two times a day (BID) | ORAL | Status: DC

## 2014-10-08 NOTE — ED Notes (Addendum)
Ace wrap and ice pack applied to left elbow per verbal instructions of  Np Janit Bern

## 2014-10-08 NOTE — ED Notes (Signed)
L elbow began swelling and became painful approximately 2 hours ago.  Denies any injury or insect bites. States he was just lying on a bed when he felt a knot on his L elbow.

## 2014-10-08 NOTE — Discharge Instructions (Signed)
Follow up with Dr. Aline Brochure if symptoms persist.

## 2014-10-08 NOTE — ED Provider Notes (Signed)
CSN: [id number]     Arrival date & time 10/08/14  1707 History   First MD Initiated Contact with Patient 10/08/14 1735     Chief Complaint  Patient presents with  . Joint Swelling     (Consider location/radiation/quality/duration/timing/severity/associated sxs/prior Treatment) The history is provided by the patient.   Logan Price is a 69 y.o. male with a hx of HTN and diverticulitis presents to the ED with swelling to the left elbow that started today. He has minimal pain. He denies any other problems. No hx of gout or other arthritis.    Past Medical History  Diagnosis Date  . Diverticulitis   . HTN (hypertension)   . Hyperlipidemia    History reviewed. No pertinent past surgical history. Family History  Problem Relation Age of Onset  . Cancer Mother   . Hypertension Father   . Hypertension Sister   . Hypertension Brother   . Hypertension Sister   . Stroke Brother     hemorrhagic   History  Substance Use Topics  . Smoking status: Former Smoker    Quit date: 10/09/2006  . Smokeless tobacco: Never Used  . Alcohol Use: Yes     Comment: 7 oz of vodka per night    Review of Systems Negative except as stated in HPI   Allergies  Review of patient's allergies indicates no known allergies.  Home Medications   Prior to Admission medications   Medication Sig Start Date End Date Taking? Authorizing Provider  amLODipine (NORVASC) 10 MG tablet Take 1 tablet (10 mg total) by mouth daily. 09/03/13   Sharion Balloon, FNP  baclofen (LIORESAL) 10 MG tablet Take 1 tablet (10 mg total) by mouth 3 (three) times daily. 04/09/14   Sharion Balloon, FNP  cholecalciferol (VITAMIN D) 1000 UNITS tablet Take 1,000 Units by mouth daily.    Historical Provider, MD  diclofenac (VOLTAREN) 50 MG EC tablet Take 1 tablet (50 mg total) by mouth 2 (two) times daily. 10/08/14   [place], NP  multivitamin [facility]) per tablet Take 1 tablet by mouth daily.      Historical Provider, MD  simvastatin  (ZOCOR) 20 MG tablet TAKE 1 TABLET (20 MG TOTAL) BY MOUTH AT BEDTIME. 08/21/14   Christy A Hawks, FNP   BP 116/65 mmHg  Pulse 78  Temp(Src) 98.1 F (36.7 C) (Oral)  Resp 14  Ht '5\' 9"'$  (1.753 m)  Wt 165 lb (74.844 kg)  BMI 24.36 kg/m2  SpO2 96% Physical Exam  Constitutional: He is oriented to person, place, and time. He appears well-developed and well-nourished.  HENT:  Head: Normocephalic.  Eyes: Conjunctivae and EOM are normal.  Neck: Neck supple.  Cardiovascular: Normal rate.   Pulmonary/Chest: Effort normal.  Musculoskeletal: Normal range of motion.       Left elbow: He exhibits swelling. He exhibits normal range of motion, no deformity and no laceration. Tenderness (mild) found. Olecranon process tenderness noted.  Radial pulses 2+ bilateral, adequate circulation.   Neurological: He is alert and oriented to person, place, and time. He has normal strength. No cranial nerve deficit or sensory deficit.  Skin: Skin is warm and dry.  Psychiatric: He has a normal mood and affect. His behavior is normal.  Nursing note and vitals reviewed.   ED Course  Procedures (including critical care time) Labs Review Labs Reviewed - No data to display  Imaging Review Dg Elbow Complete Left  10/08/2014   CLINICAL DATA:  Left elbow swelling  EXAM:  LEFT ELBOW - COMPLETE 3+ VIEW  COMPARISON:  None.  FINDINGS: No fracture or dislocation is seen.  The joint spaces are preserved.  No displaced elbow joint fat pads to suggest an elbow joint effusion.  Soft tissue swelling overlying the olecranon, suggesting olecranon bursitis.  IMPRESSION: Soft tissue swelling overlying the olecranon, suggesting olecranon bursitis.   Electronically Signed   By: Julian Hy M.D.   On: 10/08/2014 18:18     EKG Interpretation None      MDM  69 y.o. male with swelling and mild tenderness to the left elbow. Ace wrap and follow up with ortho. Stable for d/c without neurovascular compromise. Discussed with the  patient and all questioned fully answered. He will return if any problems arise.   Final diagnoses:  Olecranon bursitis, left      [facility], NP 10/09/14 3338  Milton Ferguson, MD 10/12/14 1357

## 2014-10-08 NOTE — ED Notes (Signed)
Pt given discharge instructions - verbalized understanding - Instructed pt to make certain he takes medication with food. No other complaints, Ambulated of unit with family

## 2014-10-09 ENCOUNTER — Ambulatory Visit (INDEPENDENT_AMBULATORY_CARE_PROVIDER_SITE_OTHER): Payer: Medicare Other | Admitting: Family

## 2014-10-09 ENCOUNTER — Encounter: Payer: Self-pay | Admitting: Family

## 2014-10-09 VITALS — BP 132/79 | HR 77 | Temp 97.1°F | Ht 69.0 in | Wt 161.0 lb

## 2014-10-09 DIAGNOSIS — I10 Essential (primary) hypertension: Secondary | ICD-10-CM | POA: Diagnosis not present

## 2014-10-09 DIAGNOSIS — Z23 Encounter for immunization: Secondary | ICD-10-CM

## 2014-10-09 DIAGNOSIS — E559 Vitamin D deficiency, unspecified: Secondary | ICD-10-CM

## 2014-10-09 DIAGNOSIS — M719 Bursopathy, unspecified: Secondary | ICD-10-CM

## 2014-10-09 DIAGNOSIS — E785 Hyperlipidemia, unspecified: Secondary | ICD-10-CM

## 2014-10-09 MED ORDER — AMLODIPINE BESYLATE 10 MG PO TABS: 10.0000 mg | ORAL_TABLET | Freq: Every day | ORAL | Status: DC

## 2014-10-09 NOTE — Addendum Note (Signed)
Addended by: Marylin Crosby on: 10/09/2014 09:27 AM   Modules accepted: Orders

## 2014-10-09 NOTE — Progress Notes (Signed)
Subjective:    Patient ID: Logan Price, male    DOB: Dec 20, 1945, 69 y.o.   MRN: [id number]  Pt presents to the office today for chronic follow up.  Hypertension This is a chronic problem. The current episode started more than 1 year ago. The problem has been resolved since onset. The problem is controlled. Pertinent negatives include no anxiety, headaches, palpitations, peripheral edema or shortness of breath. Risk factors for coronary artery disease include dyslipidemia and male gender. Past treatments include calcium channel blockers. The current treatment provides significant improvement. There is no history of kidney disease, CAD/MI, heart failure or a thyroid problem. There is no history of sleep apnea.  Hyperlipidemia This is a chronic problem. The current episode started more than 1 year ago. The problem is uncontrolled. Recent lipid tests were reviewed and are high. He has no history of hypothyroidism. Factors aggravating his hyperlipidemia include fatty foods. Pertinent negatives include no leg pain, myalgias or shortness of breath. Current antihyperlipidemic treatment includes statins. The current treatment provides moderate improvement of lipids. Risk factors for coronary artery disease include dyslipidemia, hypertension and male sex.   Pt went to the ED yesterday for elbow pain. Pt states his left started "swelling" yesterday. Pt states he was diagnosed with bursitis. Pt denies any pain. Pt states he has been keeping ice on his elbow with moderate relief.    Review of Systems  Constitutional: Negative.   HENT: Negative.   Respiratory: Negative.  Negative for shortness of breath.   Cardiovascular: Negative.  Negative for palpitations.  Gastrointestinal: Negative.   Endocrine: Negative.   Genitourinary: Negative.   Musculoskeletal: Negative.  Negative for myalgias.  Neurological: Negative.  Negative for headaches.  Hematological: Negative.   Psychiatric/Behavioral: Negative.     All other systems reviewed and are negative.      Objective:   Physical Exam  Constitutional: He is oriented to person, place, and time. He appears well-developed and well-nourished. No distress.  HENT:  Head: Normocephalic.  Right Ear: External ear normal.  Left Ear: External ear normal.  Mouth/Throat: Oropharynx is clear and moist.  Eyes: Pupils are equal, round, and reactive to light. Right eye exhibits no discharge. Left eye exhibits no discharge.  Neck: Normal range of motion. Neck supple. No thyromegaly present.  Cardiovascular: Normal rate, regular rhythm, normal heart sounds and intact distal pulses.   No murmur heard. Pulmonary/Chest: Effort normal and breath sounds normal. No respiratory distress. He has no wheezes.  Abdominal: Soft. Bowel sounds are normal. He exhibits no distension. There is no tenderness.  Musculoskeletal: Normal range of motion. He exhibits edema (swelling of left elbow). He exhibits no tenderness.  Neurological: He is alert and oriented to person, place, and time. He has normal reflexes. No cranial nerve deficit.  Skin: Skin is warm and dry. No rash noted. No erythema.  Psychiatric: He has a normal mood and affect. His behavior is normal. Judgment and thought content normal.  Vitals reviewed.   Blood pressure 132/79, pulse 77, temperature 97.1 F (36.2 C), temperature source Oral, height 5\' 9"  (1.753 m), weight 161 lb (73.029 kg).       Assessment & Plan:  1. Essential hypertension - CMP14+EGFR - amLODipine (NORVASC) 10 MG tablet; Take 1 tablet (10 mg total) by mouth daily.  Dispense: 90 tablet; Refill: 3  2. HLD (hyperlipidemia) - CMP14+EGFR - Lipid panel  3. Vitamin D deficiency - CMP14+EGFR - Vit D  25 hydroxy (rtn osteoporosis monitoring)  4. Bursitis -Make appt  with Ortho or follow up here to have fluid drawn off -Continue ice -Motrin as needed   Continue all meds Labs pending Health Maintenance reviewed Diet and exercise  encouraged RTO 6 months  [name], FNP

## 2014-10-09 NOTE — Patient Instructions (Addendum)
Health Maintenance A healthy lifestyle and preventative care can promote health and wellness.  Maintain regular health, dental, and eye exams.  Eat a healthy diet. Foods like vegetables, fruits, whole grains, low-fat dairy products, and lean protein foods contain the nutrients you need and are low in calories. Decrease your intake of foods high in solid fats, added sugars, and salt. Get information about a proper diet from your health care provider, if necessary.  Regular physical exercise is one of the most important things you can do for your health. Most adults should get at least 150 minutes of moderate-intensity exercise (any activity that increases your heart rate and causes you to sweat) each week. In addition, most adults need muscle-strengthening exercises on 2 or more days a week.   Maintain a healthy weight. The body mass index (BMI) is a screening tool to identify possible weight problems. It provides an estimate of body fat based on height and weight. Your health care provider can find your BMI and can help you achieve or maintain a healthy weight. For males 20 years and older:  A BMI below 18.5 is considered underweight.  A BMI of 18.5 to 24.9 is normal.  A BMI of 25 to 29.9 is considered overweight.  A BMI of 30 and above is considered obese.  Maintain normal blood lipids and cholesterol by exercising and minimizing your intake of saturated fat. Eat a balanced diet with plenty of fruits and vegetables. Blood tests for lipids and cholesterol should begin at age 20 and be repeated every 5 years. If your lipid or cholesterol levels are high, you are over age 50, or you are at high risk for heart disease, you may need your cholesterol levels checked more frequently.Ongoing high lipid and cholesterol levels should be treated with medicines if diet and exercise are not working.  If you smoke, find out from your health care provider how to quit. If you do not use tobacco, do not  start.  Lung cancer screening is recommended for adults aged 55-80 years who are at high risk for developing lung cancer because of a history of smoking. A yearly low-dose CT scan of the lungs is recommended for people who have at least a 30-pack-year history of smoking and are current smokers or have quit within the past 15 years. A pack year of smoking is smoking an average of 1 pack of cigarettes a day for 1 year (for example, a 30-pack-year history of smoking could mean smoking 1 pack a day for 30 years or 2 packs a day for 15 years). Yearly screening should continue until the smoker has stopped smoking for at least 15 years. Yearly screening should be stopped for people who develop a health problem that would prevent them from having lung cancer treatment.  If you choose to drink alcohol, do not have more than 2 drinks per day. One drink is considered to be 12 oz (360 mL) of beer, 5 oz (150 mL) of wine, or 1.5 oz (45 mL) of liquor.  Avoid the use of street drugs. Do not share needles with anyone. Ask for help if you need support or instructions about stopping the use of drugs.  High blood pressure causes heart disease and increases the risk of stroke. Blood pressure should be checked at least every 1-2 years. Ongoing high blood pressure should be treated with medicines if weight loss and exercise are not effective.  If you are 45-79 years old, ask your health care provider if   you should take aspirin to prevent heart disease.  Diabetes screening involves taking a blood sample to check your fasting blood sugar level. This should be done once every 3 years after age 45 if you are at a normal weight and without risk factors for diabetes. Testing should be considered at a younger age or be carried out more frequently if you are overweight and have at least 1 risk factor for diabetes.  Colorectal cancer can be detected and often prevented. Most routine colorectal cancer screening begins at the age of 50  and continues through age 75. However, your health care provider may recommend screening at an earlier age if you have risk factors for colon cancer. On a yearly basis, your health care provider may provide home test kits to check for hidden blood in the stool. A small camera at the end of a tube may be used to directly examine the colon (sigmoidoscopy or colonoscopy) to detect the earliest forms of colorectal cancer. Talk to your health care provider about this at age 50 when routine screening begins. A direct exam of the colon should be repeated every 5-10 years through age 75, unless early forms of precancerous polyps or small growths are found.  People who are at an increased risk for hepatitis B should be screened for this virus. You are considered at high risk for hepatitis B if:  You were born in a country where hepatitis B occurs often. Talk with your health care provider about which countries are considered high risk.  Your parents were born in a high-risk country and you have not received a shot to protect against hepatitis B (hepatitis B vaccine).  You have HIV or AIDS.  You use needles to inject street drugs.  You live with, or have sex with, someone who has hepatitis B.  You are a man who has sex with other men (MSM).  You get hemodialysis treatment.  You take certain medicines for conditions like cancer, organ transplantation, and autoimmune conditions.  Hepatitis C blood testing is recommended for all people born from 1945 through 1965 and any individual with known risk factors for hepatitis C.  Healthy men should no longer receive prostate-specific antigen (PSA) blood tests as part of routine cancer screening. Talk to your health care provider about prostate cancer screening.  Testicular cancer screening is not recommended for adolescents or adult males who have no symptoms. Screening includes self-exam, a health care provider exam, and other screening tests. Consult with your  health care provider about any symptoms you have or any concerns you have about testicular cancer.  Practice safe sex. Use condoms and avoid high-risk sexual practices to reduce the spread of sexually transmitted infections (STIs).  You should be screened for STIs, including gonorrhea and chlamydia if:  You are sexually active and are younger than 24 years.  You are older than 24 years, and your health care provider tells you that you are at risk for this type of infection.  Your sexual activity has changed since you were last screened, and you are at an increased risk for chlamydia or gonorrhea. Ask your health care provider if you are at risk.  If you are at risk of being infected with HIV, it is recommended that you take a prescription medicine daily to prevent HIV infection. This is called pre-exposure prophylaxis (PrEP). You are considered at risk if:  You are a man who has sex with other men (MSM).  You are a heterosexual man who   is sexually active with multiple partners.  You take drugs by injection.  You are sexually active with a partner who has HIV.  Talk with your health care provider about whether you are at high risk of being infected with HIV. If you choose to begin PrEP, you should first be tested for HIV. You should then be tested every 3 months for as long as you are taking PrEP.  Use sunscreen. Apply sunscreen liberally and repeatedly throughout the day. You should seek shade when your shadow is shorter than you. Protect yourself by wearing long sleeves, pants, a wide-brimmed hat, and sunglasses year round whenever you are outdoors.  Tell your health care provider of new moles or changes in moles, especially if there is a change in shape or color. Also, tell your health care provider if a mole is larger than the size of a pencil eraser.  A one-time screening for abdominal aortic aneurysm (AAA) and surgical repair of large AAAs by ultrasound is recommended for men aged  69-75 years who are current or former smokers.  Stay current with your vaccines (immunizations). Document Released: 09/09/2007 Document Revised: 03/18/2013 Document Reviewed: 08/08/2010 [facility] Patient Information 2015 [place], [place]. This information is not intended to replace advice given to you by your health care provider. Make sure you discuss any questions you have with your health care provider. Bursitis Bursitis is a swelling and soreness (inflammation) of a fluid-filled sac (bursa) that overlies and protects a joint. It can be caused by injury, overuse of the joint, arthritis or infection. The joints most likely to be affected are the elbows, shoulders, hips and knees. HOME CARE INSTRUCTIONS   Apply ice to the affected area for 15-20 minutes each hour while awake for 2 days. Put the ice in a plastic bag and place a towel between the bag of ice and your skin.  Rest the injured joint as much as possible, but continue to put the joint through a full range of motion, 4 times per day. (The shoulder joint especially becomes rapidly "frozen" if not used.) When the pain lessens, begin normal slow movements and usual activities.  Only take over-the-counter or prescription medicines for pain, discomfort or fever as directed by your caregiver.  Your caregiver may recommend draining the bursa and injecting medicine into the bursa. This may help the healing process.  Follow all instructions for follow-up with your caregiver. This includes any orthopedic referrals, physical therapy and rehabilitation. Any delay in obtaining necessary care could result in a delay or failure of the bursitis to heal and chronic pain. SEEK IMMEDIATE MEDICAL CARE IF:   Your pain increases even during treatment.  You develop an oral temperature above 102 F (38.9 C) and have heat and inflammation over the involved bursa. MAKE SURE YOU:   Understand these instructions.  Will watch your condition.  Will get help  right away if you are not doing well or get worse. Document Released: 03/10/2000 Document Revised: 06/05/2011 Document Reviewed: 06/02/2013 [facility] Patient Information 2015 [place], [place]. This information is not intended to replace advice given to you by your health care provider. Make sure you discuss any questions you have with your health care provider.

## 2014-10-10 LAB — CMP14+EGFR
A/G RATIO: 1.8 (ref 1.1–2.5)
ALT: 15 IU/L (ref 0–44)
AST: 22 IU/L (ref 0–40)
Albumin: 4.5 g/dL (ref 3.6–4.8)
Alkaline Phosphatase: 56 IU/L (ref 39–117)
BUN/Creatinine Ratio: 13 (ref 10–22)
BUN: 11 mg/dL (ref 8–27)
Bilirubin Total: 0.4 mg/dL (ref 0.0–1.2)
CO2: 26 mmol/L (ref 18–29)
Calcium: 9.5 mg/dL (ref 8.6–10.2)
Chloride: 101 mmol/L (ref 97–108)
Creatinine, Ser: 0.88 mg/dL (ref 0.76–1.27)
GFR calc Af Amer: 103 mL/min/{1.73_m2} (ref >59)
GFR, EST NON AFRICAN AMERICAN: 89 mL/min/{1.73_m2} (ref >59)
Globulin, Total: 2.5 g/dL (ref 1.5–4.5)
Glucose: 100 mg/dL — ABNORMAL HIGH (ref 65–99)
POTASSIUM: 3.8 mmol/L (ref 3.5–5.2)
SODIUM: 142 mmol/L (ref 134–144)
Total Protein: 7 g/dL (ref 6.0–8.5)

## 2014-10-10 LAB — LIPID PANEL
Chol/HDL Ratio: 2.7 ratio units (ref 0.0–5.0)
Cholesterol, Total: 164 mg/dL (ref 100–199)
HDL: 61 mg/dL (ref >39)
LDL CALC: 80 mg/dL (ref 0–99)
Triglycerides: 117 mg/dL (ref 0–149)
VLDL Cholesterol Cal: 23 mg/dL (ref 5–40)

## 2014-10-10 LAB — VITAMIN D 25 HYDROXY (VIT D DEFICIENCY, FRACTURES): Vit D, 25-Hydroxy: 50.4 ng/mL (ref 30.0–100.0)

## 2014-10-12 ENCOUNTER — Telehealth: Payer: Self-pay | Admitting: Family

## 2014-10-12 NOTE — Progress Notes (Signed)
Patient aware.

## 2014-10-12 NOTE — Telephone Encounter (Signed)
Patient aware of results.

## 2014-11-05 ENCOUNTER — Ambulatory Visit (INDEPENDENT_AMBULATORY_CARE_PROVIDER_SITE_OTHER): Payer: Medicare Other | Admitting: Orthopedic Surgery

## 2014-11-05 ENCOUNTER — Encounter: Payer: Self-pay | Admitting: Orthopedic Surgery

## 2014-11-05 VITALS — BP 114/67 | Ht 69.0 in | Wt 161.0 lb

## 2014-11-05 DIAGNOSIS — M7022 Olecranon bursitis, left elbow: Secondary | ICD-10-CM

## 2014-11-05 NOTE — Progress Notes (Signed)
New  Chief Complaint  Patient presents with  . Elbow Pain    ER follow up on left elbow pain.    Hpi: This 70 year old gentleman came in for evaluation of swelling of his left elbow. His spontaneous onset of pain and swelling with redness of his entire arm and went to the ER. He was placed on Anna biotics and the redness went away but he still has a swollen olecranon bursa. X-rays were negative except for soft tissue swelling. There is no trauma. Is now having no significant pain  Review of systems negative for fever chills streaking or swollen lymph nodes on the left side  Past Medical History  Diagnosis Date  . Diverticulitis   . HTN (hypertension)   . Hyperlipidemia    BP 114/67 mmHg  Ht '5\' 9"'$  (1.753 m)  Wt 161 lb (73.029 kg)  BMI 23.76 kg/m2 His appearance is normal his body frame is small is oriented 3 his mood is pleasant his affect is flat his gait is normal his left elbow is swollen over the olecranon bursa there is no erythema of the skin is vascular exam is normal he has normal sensation in his left arm and no axillary lymphadenopathy. He has full range of motion and stability of his elbow. His muscle tone is normal and his flexion extension strength and power are normal  I aspirated 10 mL of fluid from his left olecranon bursa it was amber colored with no purulence  Follow-up as needed for redness or reaccumulation  Verbal consent was obtained to aspirate the left elbow timeout was completed to confirm the site. The skin was prepared with ethyl chloride and alcohol. 18-gauge needle was used to aspirate 10 mL of amber colored fluid. A Band-Aid and a compression wrap was applied to wear for 48 hours follow-up as needed

## 2014-11-05 NOTE — Patient Instructions (Signed)
Return if swelling or redness comes back.

## 2014-12-01 ENCOUNTER — Encounter: Payer: Self-pay | Admitting: Orthopedic Surgery

## 2014-12-01 ENCOUNTER — Ambulatory Visit (INDEPENDENT_AMBULATORY_CARE_PROVIDER_SITE_OTHER): Payer: Medicare Other | Admitting: Orthopedic Surgery

## 2014-12-01 VITALS — BP 119/73 | Ht 69.0 in | Wt 161.0 lb

## 2014-12-01 DIAGNOSIS — M7022 Olecranon bursitis, left elbow: Secondary | ICD-10-CM

## 2014-12-01 NOTE — Progress Notes (Signed)
Patient ID: Logan Price, male   DOB: 08-21-1945, 69 y.o.   MRN: [id number]  Follow up visit  Chief Complaint  Patient presents with  . Follow-up    follow up left elbow pain and swelling s/p aspiration    BP 119/73 mmHg  Ht '5\' 9"'$  (1.753 m)  Wt 161 lb (73.029 kg)  BMI 23.76 kg/m2  Encounter Diagnosis  Name Primary?  Marland Kitchen Olecranon bursitis of left elbow Yes    History the patient had aspiration of this elbow back on August 11 he's had intermittent episodes of swelling and recurrent episodes of swelling of the left elbow. We aspirated for olecranon bursitis noninfectious. It has come back slightly smaller but he would like it reexamined  System review no erythema and no fever  No new trauma  Past Medical History  Diagnosis Date  . Diverticulitis   . HTN (hypertension)   . Hyperlipidemia     BP 119/73 mmHg  Ht '5\' 9"'$  (1.753 m)  Wt 161 lb (73.029 kg)  BMI 23.76 kg/m2 Left elbow swollen no erythema. Pulse and sensation are normal in the hand epitrochlear Lymph nodes are normal elbow remain stable motor exam intact flexors and extensors. Range of motion normal. No tenderness over the bursal tissue  Some of the bursal tissue has thickened along with fluid  Permission was granted to aspirate the elbow timeout completed to confirm  Skin prepared with ethyl chloride and alcohol  18-gauge needle used to aspirate approximate 5 mL of amber-colored fluid  Encounter Diagnosis  Name Primary?  Marland Kitchen Olecranon bursitis of left elbow Yes   I gave him the option of aspiration again or surgical excision if it comes back

## 2015-03-26 ENCOUNTER — Encounter: Payer: Self-pay | Admitting: *Deleted

## 2015-03-30 ENCOUNTER — Emergency Department (HOSPITAL_COMMUNITY)
Admission: EM | Admit: 2015-03-30 | Discharge: 2015-03-30 | Disposition: A | Discharge status: Home / Self Care | Payer: Medicare Other | Attending: Emergency Medicine | Admitting: Emergency Medicine

## 2015-03-30 ENCOUNTER — Encounter (HOSPITAL_COMMUNITY): Payer: Self-pay | Admitting: Emergency Medicine

## 2015-03-30 DIAGNOSIS — Z79899 Other long term (current) drug therapy: Secondary | ICD-10-CM | POA: Insufficient documentation

## 2015-03-30 DIAGNOSIS — I10 Essential (primary) hypertension: Secondary | ICD-10-CM | POA: Insufficient documentation

## 2015-03-30 DIAGNOSIS — R195 Other fecal abnormalities: Secondary | ICD-10-CM | POA: Diagnosis not present

## 2015-03-30 DIAGNOSIS — K625 Hemorrhage of anus and rectum: Secondary | ICD-10-CM | POA: Insufficient documentation

## 2015-03-30 DIAGNOSIS — Z87891 Personal history of nicotine dependence: Secondary | ICD-10-CM | POA: Diagnosis not present

## 2015-03-30 DIAGNOSIS — E785 Hyperlipidemia, unspecified: Secondary | ICD-10-CM | POA: Insufficient documentation

## 2015-03-30 LAB — URINALYSIS, ROUTINE W REFLEX MICROSCOPIC
Bilirubin Urine: NEGATIVE
Glucose, UA: NEGATIVE mg/dL
Ketones, ur: NEGATIVE mg/dL
LEUKOCYTES UA: NEGATIVE
Nitrite: NEGATIVE
PROTEIN: NEGATIVE mg/dL
pH: 5.5 (ref 5.0–8.0)

## 2015-03-30 LAB — URINE MICROSCOPIC-ADD ON: BACTERIA UA: NONE SEEN

## 2015-03-30 LAB — BASIC METABOLIC PANEL
ANION GAP: 7 (ref 5–15)
BUN: 18 mg/dL (ref 6–20)
CHLORIDE: 108 mmol/L (ref 101–111)
CO2: 28 mmol/L (ref 22–32)
Calcium: 9.1 mg/dL (ref 8.9–10.3)
Creatinine, Ser: 0.96 mg/dL (ref 0.61–1.24)
GFR calc Af Amer: >60 mL/min (ref >60)
Glucose, Bld: 102 mg/dL — ABNORMAL HIGH (ref 65–99)
POTASSIUM: 3.6 mmol/L (ref 3.5–5.1)
SODIUM: 143 mmol/L (ref 135–145)

## 2015-03-30 LAB — CBC
HCT: 41.7 % (ref 39.0–52.0)
HEMOGLOBIN: 14 g/dL (ref 13.0–17.0)
MCH: 30.4 pg (ref 26.0–34.0)
MCHC: 33.6 g/dL (ref 30.0–36.0)
MCV: 90.5 fL (ref 78.0–100.0)
PLATELETS: 236 10*3/uL (ref 150–400)
RBC: 4.61 MIL/uL (ref 4.22–5.81)
RDW: 14.8 % (ref 11.5–15.5)
WBC: 7.5 10*3/uL (ref 4.0–10.5)

## 2015-03-30 LAB — PROTIME-INR
INR: 1.04 (ref 0.00–1.49)
Prothrombin Time: 13.8 seconds (ref 11.6–15.2)

## 2015-03-30 LAB — POC OCCULT BLOOD, ED: FECAL OCCULT BLD: POSITIVE — AB

## 2015-03-30 MED ORDER — PANTOPRAZOLE SODIUM 20 MG PO TBEC: 20.0000 mg | DELAYED_RELEASE_TABLET | Freq: Two times a day (BID) | ORAL | Status: DC

## 2015-03-30 MED ORDER — SODIUM CHLORIDE 0.9 % IV BOLUS (SEPSIS)
500.0000 mL | Freq: Once | INTRAVENOUS | Status: AC
  Administered 2015-03-30: 500 mL via INTRAVENOUS

## 2015-03-30 NOTE — ED Provider Notes (Signed)
CSN: [id number]     Arrival date & time 03/30/15  1237 History   First MD Initiated Contact with Patient 03/30/15 1503     Chief Complaint  Patient presents with  . Hematuria  . Rectal Bleeding     (Consider location/radiation/quality/duration/timing/severity/associated sxs/prior Treatment) HPI This is a 70 year old man comes in today complaining of rectal bleeding this morning. He states the first time was with the bowel movement but he noted bright red blood before the bowel movement. The second time he noted blood when he wiped. He does not think there was blood in stool. He also noted some blood tinged urine. He is unclear if this was separate from the bowel movement or not. He is not on any blood thinners. He states that he had a bleed back in 2011 and had a colonoscopy denies any pain, nausea, vomiting, lightheadedness, or weakness. Past Medical History  Diagnosis Date  . Diverticulitis   . HTN (hypertension)   . Hyperlipidemia    History reviewed. No pertinent past surgical history. Family History  Problem Relation Age of Onset  . Cancer Mother   . Hypertension Father   . Hypertension Sister   . Hypertension Brother   . Hypertension Sister   . Stroke Brother     hemorrhagic   Social History  Substance Use Topics  . Smoking status: Former Smoker    Quit date: 10/09/2006  . Smokeless tobacco: Never Used  . Alcohol Use: Yes     Comment: 7 oz of vodka per night    Review of Systems  All other systems reviewed and are negative.     Allergies  Review of patient's allergies indicates no known allergies.  Home Medications   Prior to Admission medications   Medication Sig Start Date End Date Taking? Authorizing Provider  amLODipine (NORVASC) 10 MG tablet Take 1 tablet (10 mg total) by mouth daily. 10/09/14   Sharion Balloon, FNP  cholecalciferol (VITAMIN D) 1000 UNITS tablet Take 1,000 Units by mouth daily.    Historical Provider, MD  diclofenac (VOLTAREN) 50 MG EC  tablet Take 1 tablet (50 mg total) by mouth 2 (two) times daily. Patient not taking: Reported on 10/09/2014 10/08/14   Ashley Murrain, NP  multivitamin [facility]) per tablet Take 1 tablet by mouth daily.      Historical Provider, MD  simvastatin (ZOCOR) 20 MG tablet TAKE 1 TABLET (20 MG TOTAL) BY MOUTH AT BEDTIME. 08/21/14   Christy A Hawks, FNP   BP 133/69 mmHg  Pulse 82  Temp(Src) 97.9 F (36.6 C) (Oral)  Resp 16  Ht '5\' 9"'$  (1.753 m)  Wt 74.844 kg  BMI 24.36 kg/m2  SpO2 100% Physical Exam  Constitutional: He is oriented to person, place, and time. He appears well-developed and well-nourished.  HENT:  Head: Normocephalic and atraumatic.  Right Ear: External ear normal.  Left Ear: External ear normal.  Nose: Nose normal.  Mouth/Throat: Oropharynx is clear and moist.  Eyes: Conjunctivae and EOM are normal. Pupils are equal, round, and reactive to light.  Neck: Normal range of motion. Neck supple.  Cardiovascular: Normal rate, regular rhythm, normal heart sounds and intact distal pulses.   Pulmonary/Chest: Effort normal and breath sounds normal. No respiratory distress. He has no wheezes. He exhibits no tenderness.  Abdominal: Soft. Bowel sounds are normal. He exhibits no distension and no mass. There is no tenderness. There is no guarding.  Genitourinary: Guaiac positive stool.  Rectal exam was unremarkable and there was  very little stool but it was heme-positive  Musculoskeletal: Normal range of motion.  Neurological: He is alert and oriented to person, place, and time. He has normal reflexes. He exhibits normal muscle tone. Coordination normal.  Skin: Skin is warm and dry.  Psychiatric: He has a normal mood and affect. His behavior is normal. Judgment and thought content normal.  Nursing note and vitals reviewed.   ED Course  Procedures (including critical care time) Labs Review Labs Reviewed  URINALYSIS, ROUTINE W REFLEX MICROSCOPIC (NOT AT [facility]) - Abnormal; Notable for the  following:    Specific Gravity, Urine >1.030 (*)    Hgb urine dipstick TRACE (*)    All other components within normal limits  URINE MICROSCOPIC-ADD ON - Abnormal; Notable for the following:    Squamous Epithelial / LPF 0-5 (*)    All other components within normal limits  CBC  BASIC METABOLIC PANEL  PROTIME-INR  POC OCCULT BLOOD, ED    Imaging Review No results found. I have personally reviewed and evaluated these images and lab results as part of my medical decision-making.   EKG Interpretation None      MDM   Final diagnoses:  Rectal bleeding    70 year old male comes in today complaining of blood per rectum before and after bowel movement. He did not note any blood in the stool. He has not had any bowel movements while here in the emergency department. His hemoglobin is 14 and he is hemodynamically stable. Patient has good access to medical care and support system. We have discussed return precautions and need for close follow-up and he voices understanding. He previously had a colonoscopy performed by Dr. Oneida Alar. He is advised to call her and have recheck tomorrow. Patient is advised to stop any nonsteroidal anti-inflammatories although he denies taking any. His record lists Voltaren. He is started on Protonix.  Pattricia Boss, MD 04/01/15 1520

## 2015-03-30 NOTE — Discharge Instructions (Signed)
Please call Dr. Oneida Alar tomorrow for recheck.  Return to ed if worsened bleeding, weakness, or lightheaded.   Gastrointestinal Bleeding Gastrointestinal bleeding is bleeding somewhere along the path that food travels through the body (digestive tract). This path is anywhere between the mouth and the opening of the butt (anus). You may have blood in your throw up (vomit) or in your poop (stools). If there is a lot of bleeding, you may need to stay in the hospital. [place]  Only take medicine as told by your doctor.  Eat foods with fiber such as whole grains, fruits, and vegetables. You can also try eating 1 to 3 prunes a day.  Drink enough fluids to keep your pee (urine) clear or pale yellow. GET HELP RIGHT AWAY IF:   Your bleeding gets worse.  You feel dizzy, weak, or you pass out (faint).  You have bad cramps in your back or belly (abdomen).  You have large blood clumps (clots) in your poop.  Your problems are getting worse. MAKE SURE YOU:   Understand these instructions.  Will watch your condition.  Will get help right away if you are not doing well or get worse.   This information is not intended to replace advice given to you by your health care provider. Make sure you discuss any questions you have with your health care provider.   Document Released: 12/21/2007 Document Revised: 02/28/2012 Document Reviewed: 08/31/2014 Elsevier Interactive Patient Education [employer].

## 2015-03-30 NOTE — ED Notes (Signed)
Noted blood tingle to uring and stool this am.  Bright red blood when wiping with stool.  Denies any pain at this time.

## 2015-03-31 ENCOUNTER — Encounter: Payer: Self-pay | Admitting: Internal Medicine

## 2015-03-31 ENCOUNTER — Telehealth: Payer: Self-pay | Admitting: Gastroenterology

## 2015-03-31 NOTE — Telephone Encounter (Signed)
Previously saw RMR in 2011. Ok to schedule.

## 2015-03-31 NOTE — Telephone Encounter (Signed)
OV made and letter mailed °

## 2015-03-31 NOTE — Telephone Encounter (Signed)
Pt called to make follow up OV with [place]. He was seen in the ED recently and was recommended to follow up with [place]. Please advise if we can accept as a new patient.

## 2015-04-13 ENCOUNTER — Ambulatory Visit: Payer: Medicare Other | Admitting: Family

## 2015-04-14 ENCOUNTER — Telehealth: Payer: Self-pay | Admitting: *Deleted

## 2015-04-14 ENCOUNTER — Telehealth: Payer: Self-pay | Admitting: General Practice

## 2015-04-14 ENCOUNTER — Ambulatory Visit: Payer: Medicare Other | Admitting: Family

## 2015-04-14 ENCOUNTER — Encounter: Payer: Self-pay | Admitting: Nurse Practitioner

## 2015-04-14 ENCOUNTER — Other Ambulatory Visit: Payer: Self-pay

## 2015-04-14 ENCOUNTER — Ambulatory Visit (INDEPENDENT_AMBULATORY_CARE_PROVIDER_SITE_OTHER): Payer: Medicare Other | Admitting: Nurse Practitioner

## 2015-04-14 VITALS — BP 118/73 | HR 82 | Temp 97.4°F | Ht 69.0 in | Wt 161.0 lb

## 2015-04-14 DIAGNOSIS — I10 Essential (primary) hypertension: Secondary | ICD-10-CM

## 2015-04-14 DIAGNOSIS — K625 Hemorrhage of anus and rectum: Secondary | ICD-10-CM | POA: Diagnosis not present

## 2015-04-14 DIAGNOSIS — Z86010 Personal history of colonic polyps: Secondary | ICD-10-CM

## 2015-04-14 MED ORDER — AMLODIPINE BESYLATE 10 MG PO TABS: 10.0000 mg | ORAL_TABLET | Freq: Every day | ORAL | Status: DC

## 2015-04-14 MED ORDER — NA SULFATE-K SULFATE-MG SULF 17.5-3.13-1.6 GM/177ML PO SOLN: 1.0000 | ORAL | Status: DC

## 2015-04-14 NOTE — Assessment & Plan Note (Signed)
Patient with rectal bleeding and heme-positive stool in the emergency room. No sign of overt blood loss as his hemoglobin was 14. His last colonoscopy was approximately 6 years ago with a single tubular adenoma. Given his rectal bleeding, history of tubular adenoma, and last colonoscopy 6 years ago we'll proceed with a diagnostic colonoscopy to further evaluate. Differentials include benign anorectal source, diverticular limited bleed, polyp. However cannot exclude more insidious pathology. Follow-up based on recommendations from procedure or as needed for any other stomach/colon signs or symptoms.  Proceed with colonoscopy in the OR with propofol/MAC with Dr. Oneida Alar in the near future. The risks, benefits, and alternatives have been discussed in detail with the patient. They state understanding and desire to proceed.   The patient is not on any anticoagulants, anxiolytics, chronic pain medications, or antidepressants. However, he does drink 7 ounces of vodka a day. Due to significant alcohol consumption we'll plan for the procedure and the OR on propofol/MAC to promote adequate sedation.

## 2015-04-14 NOTE — Assessment & Plan Note (Signed)
Last colonoscopy in 2011 with a single polyp deemed to be tubular adenoma on surgical pathology. Given his symptoms as noted above we'll proceed with diagnostic colonoscopy to further evaluate. Colonoscopy as noted above.

## 2015-04-14 NOTE — Patient Instructions (Signed)
1. We will schedule your procedure for you. 2. Further recommendations to be based on the results of your procedure. 3. Return for follow-up as needed for stomach or colon symptoms or based on recommendations after your colonoscopy.

## 2015-04-14 NOTE — Progress Notes (Signed)
Primary Care Physician:  Evelina Dun, FNP Primary Gastroenterologist:  Dr. Oneida Alar  Chief Complaint  Patient presents with  . Rectal Bleeding    seen in ED    HPI:   70 year old male presents for follow-up from the emergency department. He was seen in the ED 03/30/2015 for rectal bleeding. [facility] room notes reviewed. His symptoms occurred the day of his presentation to the ER consisted of bright red blood before his bowel movement and in the bowel movement. He had a second episode of toilet tissue hematochezia afterward. He denied any other associated symptoms including nausea, vomiting, abdominal pain. His stools were heme positive in the emergency room. Hemoglobin was 14 in the ER. Last colonoscopy in 2011 during admission for bleeding which found a single polyp in the ascending colon which was found to be tubular adenoma on surgical pathology.  Today he states he has not seen any bleeding since the ER visit. Denies abdominal pain, N/V, no changes in bowel habits, fever, chills, unintentional weight loss. Has a bowel movement about 2-3 times a day and consistency varies. Denies overt issues of constipation and straining. Has occasional GERD symptoms, improved on PPI which was started on Protonix by the ED. Had previously tried prilosec and zantac and they upset his stomach and made him "feel sick."  Denies NSAIDs and ASA powders. Follows a no seeds-no nuts diet for diverticulosis. Denies chest pain, dyspnea, dizziness, lightheadedness, syncope, near syncope. Denies any other upper or lower GI symptoms.  Past Medical History  Diagnosis Date  . Diverticulitis   . HTN (hypertension)   . Hyperlipidemia     No past surgical history on file.  Current Outpatient Prescriptions  Medication Sig Dispense Refill  . amLODipine (NORVASC) 10 MG tablet Take 1 tablet (10 mg total) by mouth daily. 90 tablet 3  . cholecalciferol (VITAMIN D) 1000 UNITS tablet Take 1,000 Units by mouth daily.    .  Multiple Vitamin (MULTIVITAMIN WITH MINERALS) TABS tablet Take 1 tablet by mouth daily.    . pantoprazole (PROTONIX) 20 MG tablet Take 1 tablet (20 mg total) by mouth 2 (two) times daily. 60 tablet 0  . simvastatin (ZOCOR) 20 MG tablet TAKE 1 TABLET (20 MG TOTAL) BY MOUTH AT BEDTIME. 90 tablet 2   No current facility-administered medications for this visit.    Allergies as of 04/14/2015  . (No Known Allergies)    Family History  Problem Relation Age of Onset  . Cancer Mother   . Hypertension Father   . Hypertension Sister   . Hypertension Brother   . Hypertension Sister   . Stroke Brother     hemorrhagic    Social History   Social History  . Marital Status: Married    Spouse Name: N/A  . Number of Children: N/A  . Years of Education: N/A   Occupational History  . Not on file.   Social History Main Topics  . Smoking status: Former Smoker    Quit date: 10/09/2006  . Smokeless tobacco: Never Used  . Alcohol Use: Yes     Comment: 7 oz of vodka per night  . Drug Use: No  . Sexual Activity: Not on file   Other Topics Concern  . Not on file   Social History Narrative    Review of Systems: 10-point ROS negative except as per HPI.    Physical Exam: BP 118/73 mmHg  Pulse 82  Temp(Src) 97.4 F (36.3 C) (Oral)  Ht '5\' 9"'$  (1.753  m)  Wt 161 lb (73.029 kg)  BMI 23.76 kg/m2 General:   Alert and oriented. Pleasant and cooperative. Well-nourished and well-developed.  Head:  Normocephalic and atraumatic. Eyes:  Without icterus, sclera clear and conjunctiva pink.  Ears:  Normal auditory acuity. Cardiovascular:  S1, S2 present without murmurs appreciated. Extremities without clubbing or edema. Respiratory:  Clear to auscultation bilaterally. No wheezes, rales, or rhonchi. No distress.  Gastrointestinal:  +BS, soft, non-tender and non-distended. No HSM noted. No guarding or rebound. No masses appreciated.  Rectal:  Deferred  Neurologic:  Alert and oriented x4;  grossly  normal neurologically. Psych:  Alert and cooperative. Normal mood and affect. Heme/Lymph/Immune: No excessive bruising noted.    04/14/2015 9:56 AM

## 2015-04-14 NOTE — Telephone Encounter (Signed)
done

## 2015-04-14 NOTE — Telephone Encounter (Signed)
Patient called in and stated he could go to have his tcs done 2/7 at 1:00 pm.  I placed a hold on SL[facility]'s schedule for 2/7th.  I told him we would mail out his prep instructions and send his colon prep Rx to [facility] in [place]  Patient voiced understanding   Routing to G[facility] for complete orders

## 2015-04-14 NOTE — Telephone Encounter (Signed)
Instructions are in the mail and orders are in

## 2015-04-19 NOTE — Progress Notes (Signed)
cc'ed to pcp °

## 2015-04-22 ENCOUNTER — Ambulatory Visit (INDEPENDENT_AMBULATORY_CARE_PROVIDER_SITE_OTHER): Payer: Medicare Other | Admitting: Family

## 2015-04-22 ENCOUNTER — Encounter: Payer: Self-pay | Admitting: Family

## 2015-04-22 VITALS — BP 115/71 | HR 75 | Temp 97.1°F | Ht 69.0 in | Wt 161.2 lb

## 2015-04-22 DIAGNOSIS — Z1159 Encounter for screening for other viral diseases: Secondary | ICD-10-CM

## 2015-04-22 DIAGNOSIS — E785 Hyperlipidemia, unspecified: Secondary | ICD-10-CM | POA: Diagnosis not present

## 2015-04-22 DIAGNOSIS — K219 Gastro-esophageal reflux disease without esophagitis: Secondary | ICD-10-CM | POA: Diagnosis not present

## 2015-04-22 DIAGNOSIS — I10 Essential (primary) hypertension: Secondary | ICD-10-CM | POA: Diagnosis not present

## 2015-04-22 DIAGNOSIS — E559 Vitamin D deficiency, unspecified: Secondary | ICD-10-CM

## 2015-04-22 MED ORDER — PANTOPRAZOLE SODIUM 20 MG PO TBEC: 20.0000 mg | DELAYED_RELEASE_TABLET | Freq: Two times a day (BID) | ORAL | Status: DC

## 2015-04-22 MED ORDER — AMLODIPINE BESYLATE 10 MG PO TABS: 10.0000 mg | ORAL_TABLET | Freq: Every day | ORAL | Status: DC

## 2015-04-22 MED ORDER — SIMVASTATIN 20 MG PO TABS: ORAL_TABLET | ORAL | Status: DC

## 2015-04-22 NOTE — Progress Notes (Signed)
Subjective:    Patient ID: Logan Price, male    DOB: February 25, 1946, 70 y.o.   MRN: [id number]  Pt presents to the office today for chronic follow up. Pt is scheduled for a colonoscopy on 05/04/15 for rectal bleeding.  Hypertension This is a chronic problem. The current episode started more than 1 year ago. The problem has been resolved since onset. The problem is controlled. Pertinent negatives include no anxiety, chest pain, headaches, palpitations, peripheral edema or shortness of breath. Risk factors for coronary artery disease include dyslipidemia and male gender. Past treatments include calcium channel blockers. The current treatment provides significant improvement. There is no history of kidney disease, CAD/MI, heart failure or a thyroid problem. There is no history of sleep apnea.  Hyperlipidemia This is a chronic problem. The current episode started more than 1 year ago. The problem is controlled. Recent lipid tests were reviewed and are normal. He has no history of hypothyroidism. Factors aggravating his hyperlipidemia include fatty foods. Pertinent negatives include no chest pain, leg pain, myalgias or shortness of breath. Current antihyperlipidemic treatment includes statins. The current treatment provides moderate improvement of lipids. Risk factors for coronary artery disease include dyslipidemia, hypertension and male sex.  Gastroesophageal Reflux He reports no chest pain, no coughing, no heartburn or no wheezing. This is a new problem. The current episode started 1 to 4 weeks ago. The problem occurs occasionally. The problem has been waxing and waning. The symptoms are aggravated by certain foods. He has tried a PPI for the symptoms. The treatment provided moderate relief.      Review of Systems  Constitutional: Negative.   HENT: Negative.   Respiratory: Negative.  Negative for cough, shortness of breath and wheezing.   Cardiovascular: Negative.  Negative for chest pain and  palpitations.  Gastrointestinal: Negative.  Negative for heartburn.  Endocrine: Negative.   Genitourinary: Negative.   Musculoskeletal: Negative.  Negative for myalgias.  Neurological: Negative.  Negative for headaches.  Hematological: Negative.   Psychiatric/Behavioral: Negative.   All other systems reviewed and are negative.      Objective:   Physical Exam  Constitutional: He is oriented to person, place, and time. He appears well-developed and well-nourished. No distress.  HENT:  Head: Normocephalic.  Right Ear: External ear normal.  Left Ear: External ear normal.  Nose: Nose normal.  Mouth/Throat: Oropharynx is clear and moist.  Eyes: Pupils are equal, round, and reactive to light. Right eye exhibits no discharge. Left eye exhibits no discharge.  Neck: Normal range of motion. Neck supple. No thyromegaly present.  Cardiovascular: Normal rate, regular rhythm, normal heart sounds and intact distal pulses.   No murmur heard. Pulmonary/Chest: Effort normal and breath sounds normal. No respiratory distress. He has no wheezes.  Abdominal: Soft. Bowel sounds are normal. He exhibits no distension. There is no tenderness.  Musculoskeletal: Normal range of motion. He exhibits no edema or tenderness.  Neurological: He is alert and oriented to person, place, and time. He has normal reflexes. No cranial nerve deficit.  Skin: Skin is warm and dry. No rash noted. No erythema.  Psychiatric: He has a normal mood and affect. His behavior is normal. Judgment and thought content normal.  Vitals reviewed.    BP 115/71 mmHg  Pulse 75  Temp(Src) 97.1 F (36.2 C) (Oral)  Ht 5\' 9"  (1.753 m)  Wt 161 lb 3.2 oz (73.12 kg)  BMI 23.79 kg/m2      Assessment & Plan:  1. Essential hypertension - CMP14+EGFR -  amLODipine (NORVASC) 10 MG tablet; Take 1 tablet (10 mg total) by mouth daily.  Dispense: 90 tablet; Refill: 1  2. HLD (hyperlipidemia) - CMP14+EGFR - Lipid panel - simvastatin (ZOCOR) 20  MG tablet; TAKE 1 TABLET (20 MG TOTAL) BY MOUTH AT BEDTIME.  Dispense: 90 tablet; Refill: 2  3. Vitamin D deficiency - CMP14+EGFR - VITAMIN D 25 Hydroxy (Vit-D Deficiency, Fractures)  4. Need for hepatitis C screening test - CMP14+EGFR - Hepatitis C antibody  5. Gastroesophageal reflux disease, esophagitis presence not specified - pantoprazole (PROTONIX) 20 MG tablet; Take 1 tablet (20 mg total) by mouth 2 (two) times daily.  Dispense: 60 tablet; Refill: 0   Continue all meds Labs pending Health Maintenance reviewed Diet and exercise encouraged RTO 6 months  [name], FNP

## 2015-04-22 NOTE — Patient Instructions (Signed)

## 2015-04-23 ENCOUNTER — Other Ambulatory Visit: Payer: Self-pay | Admitting: Family

## 2015-04-23 ENCOUNTER — Other Ambulatory Visit: Payer: Medicare Other

## 2015-04-23 DIAGNOSIS — R768 Other specified abnormal immunological findings in serum: Secondary | ICD-10-CM

## 2015-04-23 DIAGNOSIS — R894 Abnormal immunological findings in specimens from other organs, systems and tissues: Secondary | ICD-10-CM | POA: Diagnosis not present

## 2015-04-23 LAB — CMP14+EGFR
ALK PHOS: 52 IU/L (ref 39–117)
ALT: 15 IU/L (ref 0–44)
AST: 17 IU/L (ref 0–40)
Albumin/Globulin Ratio: 1.7 (ref 1.1–2.5)
Albumin: 4.3 g/dL (ref 3.6–4.8)
BUN/Creatinine Ratio: 12 (ref 10–22)
BUN: 14 mg/dL (ref 8–27)
Bilirubin Total: 0.4 mg/dL (ref 0.0–1.2)
CALCIUM: 9.3 mg/dL (ref 8.6–10.2)
CO2: 25 mmol/L (ref 18–29)
CREATININE: 1.16 mg/dL (ref 0.76–1.27)
Chloride: 103 mmol/L (ref 96–106)
GFR calc Af Amer: 74 mL/min/{1.73_m2} (ref >59)
GFR, EST NON AFRICAN AMERICAN: 64 mL/min/{1.73_m2} (ref >59)
GLOBULIN, TOTAL: 2.5 g/dL (ref 1.5–4.5)
GLUCOSE: 86 mg/dL (ref 65–99)
POTASSIUM: 3.9 mmol/L (ref 3.5–5.2)
SODIUM: 143 mmol/L (ref 134–144)
Total Protein: 6.8 g/dL (ref 6.0–8.5)

## 2015-04-23 LAB — LIPID PANEL
CHOLESTEROL TOTAL: 173 mg/dL (ref 100–199)
Chol/HDL Ratio: 2.3 ratio units (ref 0.0–5.0)
HDL: 75 mg/dL (ref >39)
LDL Calculated: 73 mg/dL (ref 0–99)
TRIGLYCERIDES: 126 mg/dL (ref 0–149)
VLDL Cholesterol Cal: 25 mg/dL (ref 5–40)

## 2015-04-23 LAB — VITAMIN D 25 HYDROXY (VIT D DEFICIENCY, FRACTURES): VIT D 25 HYDROXY: 39 ng/mL (ref 30.0–100.0)

## 2015-04-23 LAB — HEPATITIS C ANTIBODY: Hep C Virus Ab: >11 s/co ratio — ABNORMAL HIGH (ref 0.0–0.9)

## 2015-04-28 NOTE — Patient Instructions (Signed)
Logan Price  04/28/2015     '@PREFPERIOPPHARMACY'$ @   Your procedure is scheduled on  05/04/2015  Report to [facility] at  1130  A.M.  Call this number if you have problems the morning of surgery:  [phone]   Remember:  Do not eat food or drink liquids after midnight.  Take these medicines the morning of surgery with A SIP OF WATER  Amlodipine, protonix.   Do not wear jewelry, make-up or nail polish.  Do not wear lotions, powders, or perfumes.  You may wear deodorant.  Do not shave 48 hours prior to surgery.  Men may shave face and neck.  Do not bring valuables to the hospital.  [facility] is not responsible for any belongings or valuables.  Contacts, dentures or bridgework may not be worn into surgery.  Leave your suitcase in the car.  After surgery it may be brought to your room.  For patients admitted to the hospital, discharge time will be determined by your treatment team.  Patients discharged the day of surgery will not be allowed to drive home.   Name and phone number of your driver:   family Special instructions:  Follow the diet and prep instructions given to youo by Dr Nona Dell office.  Please read over the following fact sheets that you were given. Coughing and Deep Breathing, Surgical Site Infection Prevention, Anesthesia Post-op Instructions and Care and Recovery After Surgery      Colonoscopy A colonoscopy is an exam to look at the entire large intestine (colon). This exam can help find problems such as tumors, polyps, inflammation, and areas of bleeding. The exam takes about 1 hour.  LET [facility] CARE PROVIDER KNOW ABOUT:   Any allergies you have.  All medicines you are taking, including vitamins, herbs, eye drops, creams, and over-the-counter medicines.  Previous problems you or members of your family have had with the use of anesthetics.  Any blood disorders you have.  Previous surgeries you have had.  Medical conditions you  have. RISKS AND COMPLICATIONS  Generally, this is a safe procedure. However, as with any procedure, complications can occur. Possible complications include:  Bleeding.  Tearing or rupture of the colon wall.  Reaction to medicines given during the exam.  Infection (rare). BEFORE THE PROCEDURE   Ask your health care provider about changing or stopping your regular medicines.  You may be prescribed an oral bowel prep. This involves drinking a large amount of medicated liquid, starting the day before your procedure. The liquid will cause you to have multiple loose stools until your stool is almost clear or light green. This cleans out your colon in preparation for the procedure.  Do not eat or drink anything else once you have started the bowel prep, unless your health care provider tells you it is safe to do so.  Arrange for someone to drive you home after the procedure. PROCEDURE   You will be given medicine to help you relax (sedative).  You will lie on your side with your knees bent.  A long, flexible tube with a light and camera on the end (colonoscope) will be inserted through the rectum and into the colon. The camera sends video back to a computer screen as it moves through the colon. The colonoscope also releases carbon dioxide gas to inflate the colon. This helps your health care provider see the area better.  During the exam, your health care  provider may take a small tissue sample (biopsy) to be examined under a microscope if any abnormalities are found.  The exam is finished when the entire colon has been viewed. AFTER THE PROCEDURE   Do not drive for 24 hours after the exam.  You may have a small amount of blood in your stool.  You may pass moderate amounts of gas and have mild abdominal cramping or bloating. This is caused by the gas used to inflate your colon during the exam.  Ask when your test results will be ready and how you will get your results. Make sure you  get your test results.   This information is not intended to replace advice given to you by your health care provider. Make sure you discuss any questions you have with your health care provider.   Document Released: 03/10/2000 Document Revised: 01/01/2013 Document Reviewed: 11/18/2012 Elsevier Interactive Patient Education 2016 Elsevier Inc. Colonoscopy, Care After Refer to this sheet in the next few weeks. These instructions provide you with information on caring for yourself after your procedure. Your health care provider may also give you more specific instructions. Your treatment has been planned according to current medical practices, but problems sometimes occur. Call your health care provider if you have any problems or questions after your procedure. WHAT TO EXPECT AFTER THE PROCEDURE  After your procedure, it is typical to have the following:  A small amount of blood in your stool.  Moderate amounts of gas and mild abdominal cramping or bloating. HOME CARE INSTRUCTIONS  Do not drive, operate machinery, or sign important documents for 24 hours.  You may shower and resume your regular physical activities, but move at a slower pace for the first 24 hours.  Take frequent rest periods for the first 24 hours.  Walk around or put a warm pack on your abdomen to help reduce abdominal cramping and bloating.  Drink enough fluids to keep your urine clear or pale yellow.  You may resume your normal diet as instructed by your health care provider. Avoid heavy or fried foods that are hard to digest.  Avoid drinking alcohol for 24 hours or as instructed by your health care provider.  Only take over-the-counter or prescription medicines as directed by your health care provider.  If a tissue sample (biopsy) was taken during your procedure:  Do not take aspirin or blood thinners for 7 days, or as instructed by your health care provider.  Do not drink alcohol for 7 days, or as instructed  by your health care provider.  Eat soft foods for the first 24 hours. SEEK MEDICAL CARE IF: You have persistent spotting of blood in your stool 2-3 days after the procedure. SEEK IMMEDIATE MEDICAL CARE IF:  You have more than a small spotting of blood in your stool.  You pass large blood clots in your stool.  Your abdomen is swollen (distended).  You have nausea or vomiting.  You have a fever.  You have increasing abdominal pain that is not relieved with medicine.   This information is not intended to replace advice given to you by your health care provider. Make sure you discuss any questions you have with your health care provider.   Document Released: 10/26/2003 Document Revised: 01/01/2013 Document Reviewed: 11/18/2012 Elsevier Interactive Patient Education 2016 Elsevier Inc. PATIENT INSTRUCTIONS POST-ANESTHESIA  IMMEDIATELY FOLLOWING SURGERY:  Do not drive or operate machinery for the first twenty four hours after surgery.  Do not make any important decisions for  twenty four hours after surgery or while taking narcotic pain medications or sedatives.  If you develop intractable nausea and vomiting or a severe headache please notify your doctor immediately.  FOLLOW-UP:  Please make an appointment with your surgeon as instructed. You do not need to follow up with anesthesia unless specifically instructed to do so.  WOUND CARE INSTRUCTIONS (if applicable):  Keep a dry clean dressing on the anesthesia/puncture wound site if there is drainage.  Once the wound has quit draining you may leave it open to air.  Generally you should leave the bandage intact for twenty four hours unless there is drainage.  If the epidural site drains for more than 36-48 hours please call the anesthesia department.  QUESTIONS?:  Please feel free to call your physician or the hospital operator if you have any questions, and they will be happy to assist you.

## 2015-04-29 ENCOUNTER — Encounter (HOSPITAL_COMMUNITY): Payer: Self-pay

## 2015-04-29 ENCOUNTER — Encounter (HOSPITAL_COMMUNITY)
Admission: RE | Admit: 2015-04-29 | Discharge: 2015-04-29 | Disposition: A | Discharge status: Home / Self Care | Payer: Medicare Other | Source: Ambulatory Visit | Attending: Gastroenterology | Admitting: Gastroenterology

## 2015-04-29 ENCOUNTER — Other Ambulatory Visit: Payer: Self-pay | Admitting: Family

## 2015-04-29 DIAGNOSIS — R768 Other specified abnormal immunological findings in serum: Secondary | ICD-10-CM

## 2015-04-29 LAB — HCV AB W REFLEX TO QUANT PCR: HCV Ab: >11 s/co ratio — ABNORMAL HIGH (ref 0.0–0.9)

## 2015-04-29 LAB — HCV RT-PCR, QUANT (NON-GRAPH): HEPATITIS C QUANTITATION: NOT DETECTED [IU]/mL

## 2015-04-30 ENCOUNTER — Encounter (INDEPENDENT_AMBULATORY_CARE_PROVIDER_SITE_OTHER): Payer: Self-pay | Admitting: *Deleted

## 2015-05-03 ENCOUNTER — Other Ambulatory Visit: Payer: Self-pay | Admitting: Family

## 2015-05-04 ENCOUNTER — Ambulatory Visit (HOSPITAL_COMMUNITY): Payer: Medicare Other | Admitting: Anesthesiology

## 2015-05-04 ENCOUNTER — Ambulatory Visit (HOSPITAL_COMMUNITY)
Admission: RE | Admit: 2015-05-04 | Discharge: 2015-05-04 | Disposition: A | Discharge status: Home / Self Care | Payer: Medicare Other | Source: Ambulatory Visit | Attending: Gastroenterology | Admitting: Gastroenterology

## 2015-05-04 ENCOUNTER — Encounter (HOSPITAL_COMMUNITY): Payer: Self-pay | Admitting: *Deleted

## 2015-05-04 ENCOUNTER — Encounter (HOSPITAL_COMMUNITY)
Admission: RE | Disposition: A | Discharge status: Home / Self Care | Payer: Self-pay | Source: Ambulatory Visit | Attending: Gastroenterology

## 2015-05-04 DIAGNOSIS — I10 Essential (primary) hypertension: Secondary | ICD-10-CM | POA: Insufficient documentation

## 2015-05-04 DIAGNOSIS — K5730 Diverticulosis of large intestine without perforation or abscess without bleeding: Secondary | ICD-10-CM | POA: Diagnosis not present

## 2015-05-04 DIAGNOSIS — D122 Benign neoplasm of ascending colon: Secondary | ICD-10-CM | POA: Diagnosis not present

## 2015-05-04 DIAGNOSIS — K921 Melena: Secondary | ICD-10-CM | POA: Diagnosis not present

## 2015-05-04 DIAGNOSIS — K648 Other hemorrhoids: Secondary | ICD-10-CM | POA: Diagnosis not present

## 2015-05-04 DIAGNOSIS — K219 Gastro-esophageal reflux disease without esophagitis: Secondary | ICD-10-CM | POA: Insufficient documentation

## 2015-05-04 DIAGNOSIS — D124 Benign neoplasm of descending colon: Secondary | ICD-10-CM | POA: Diagnosis not present

## 2015-05-04 DIAGNOSIS — Z86010 Personal history of colonic polyps: Secondary | ICD-10-CM | POA: Diagnosis not present

## 2015-05-04 DIAGNOSIS — E785 Hyperlipidemia, unspecified: Secondary | ICD-10-CM | POA: Diagnosis not present

## 2015-05-04 DIAGNOSIS — Z79899 Other long term (current) drug therapy: Secondary | ICD-10-CM | POA: Insufficient documentation

## 2015-05-04 DIAGNOSIS — Z87891 Personal history of nicotine dependence: Secondary | ICD-10-CM | POA: Diagnosis not present

## 2015-05-04 DIAGNOSIS — K639 Disease of intestine, unspecified: Secondary | ICD-10-CM

## 2015-05-04 HISTORY — PX: POLYPECTOMY: SHX5525

## 2015-05-04 HISTORY — PX: COLONOSCOPY WITH PROPOFOL: SHX5780

## 2015-05-04 HISTORY — PX: BIOPSY: SHX5522

## 2015-05-04 SURGERY — COLONOSCOPY WITH PROPOFOL
Anesthesia: Monitor Anesthesia Care

## 2015-05-04 MED ORDER — FENTANYL CITRATE (PF) 100 MCG/2ML IJ SOLN
INTRAMUSCULAR | Status: AC
  Filled 2015-05-04: qty 2

## 2015-05-04 MED ORDER — ONDANSETRON HCL 4 MG/2ML IJ SOLN: 4.0000 mg | Freq: Once | INTRAMUSCULAR | Status: DC | PRN

## 2015-05-04 MED ORDER — MIDAZOLAM HCL 2 MG/2ML IJ SOLN
1.0000 mg | INTRAMUSCULAR | Status: DC | PRN
  Administered 2015-05-04: 2 mg via INTRAVENOUS

## 2015-05-04 MED ORDER — SIMETHICONE 40 MG/0.6ML PO SUSP
ORAL | Status: DC | PRN
  Administered 2015-05-04: 13:00:00

## 2015-05-04 MED ORDER — FENTANYL CITRATE (PF) 100 MCG/2ML IJ SOLN
25.0000 ug | INTRAMUSCULAR | Status: AC
  Administered 2015-05-04: 25 ug via INTRAVENOUS

## 2015-05-04 MED ORDER — LACTATED RINGERS IV SOLN
INTRAVENOUS | Status: DC
  Administered 2015-05-04: 13:00:00 via INTRAVENOUS

## 2015-05-04 MED ORDER — MIDAZOLAM HCL 2 MG/2ML IJ SOLN
INTRAMUSCULAR | Status: AC
  Filled 2015-05-04: qty 2

## 2015-05-04 MED ORDER — FENTANYL CITRATE (PF) 100 MCG/2ML IJ SOLN: 25.0000 ug | INTRAMUSCULAR | Status: DC | PRN

## 2015-05-04 MED ORDER — PROPOFOL 10 MG/ML IV BOLUS
INTRAVENOUS | Status: DC | PRN
  Administered 2015-05-04: 125 mg via INTRAVENOUS

## 2015-05-04 NOTE — Op Note (Signed)
[facility] [address] [place], [address]   COLONOSCOPY PROCEDURE REPORT  PATIENT: Logan, Price  MR#: [id number] BIRTHDATE: 1945/04/30 , 69  yrs. old GENDER: male ENDOSCOPIST: Danie Binder, MD REFERRED VU:[id number] Hawks, [place]:  2015/05/27 PROCEDURE:   Colonoscopy with biopsy INDICATIONS:hematochezia. MEDICATIONS: Monitored anesthesia care  DESCRIPTION OF PROCEDURE:    Physical exam was performed.  Informed consent was obtained from the patient after explaining the benefits, risks, and alternatives to procedure.  The patient was connected to monitor and placed in left lateral position. Continuous oxygen was provided by nasal cannula and IV medicine administered through an indwelling cannula.  After administration of sedation and rectal exam, the patients rectum was intubated and the EC-[id number] ([id number])  colonoscope was advanced under direct visualization to the ileum.  The scope was removed slowly by carefully examining the color, texture, anatomy, and integrity mucosa on the way out.  The patient was recovered in endoscopy and discharged home in satisfactory condition. Estimated blood loss is zero unless otherwise noted in this procedure report.    COLON FINDINGS: The examined terminal ileum appeared to be normal. 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 JUMBO COLD BIOPSIES OBTAINED, A sessile polyp measuring 2 mm in size was found in the ascending colon.  A polypectomy was performed with cold forceps.  , A sessile polyp measuring 6 mm in size was found in the descending colon.  A polypectomy was performed using snare cautery.  , There was moderate diverticulosis noted in the sigmoid colon, ascending colon, and at the cecum with associated muscular hypertrophy. Moderate sized internal hemorrhoids were found.  PREP QUALITY: excellent.  CECAL W/D TIME: 22       minutes COMPLICATIONS: None  ENDOSCOPIC IMPRESSION: 1.    NORMAL APPEARING ILEUM WITH  PROMINENT ILEOCECAL VALVE 2.   TWO COLON POLYPS REMOVED 3.    Moderate diverticulosis in the sigmoid colon, ascending colon, and at the cecum 4.   Moderate sized internal hemorrhoids  RECOMMENDATIONS: FOLLOW A HIGH FIBER DIET. AWAIT BIOPSY RESULTS.  IF ADENOMATOUS CHANGES, PT WILL NEED TO CONSIDER R HEMICOLECTOMY. Next colonoscopy in 1-3 years.   _______________________________ eSignedDanie Binder, MD 27-May-2015 8:13 PM   CPT CODES: ICD CODES:  The ICD and CPT codes recommended by this software are interpretations from the data that the clinical staff has captured with the software.  The verification of the translation of this report to the ICD and CPT codes and modifiers is the sole responsibility of the health care institution and practicing physician where this report was generated.  [employer] will not be held responsible for the validity of the ICD and CPT codes included on this report.  AMA assumes no liability for data contained or not contained herein. CPT is a Designer, television/film set of the [employer].

## 2015-05-04 NOTE — Transfer of Care (Signed)
Immediate Anesthesia Transfer of Care Note  Patient: Logan Price  Procedure(s) Performed: Procedure(s) with comments: COLONOSCOPY WITH PROPOFOL (N/A) - 100 POLYPECTOMY - descending colon polyp, ascending colon polyp BIOPSY - bx's of ileocecal valve   Patient Location: PACU  Anesthesia Type:MAC  Level of Consciousness: awake, alert , oriented and patient cooperative  Airway & Oxygen Therapy: Patient Spontanous Breathing  Post-op Assessment: Report given to RN and Post -op Vital signs reviewed and stable  Post vital signs: Reviewed and stable  Last Vitals:  Filed Vitals:   05/04/15 1240 05/04/15 1245  BP: 140/81 138/81  Temp:    Resp: 18 24    Complications: No apparent anesthesia complications

## 2015-05-04 NOTE — Anesthesia Postprocedure Evaluation (Signed)
Anesthesia Post Note  Patient: Logan Price  Procedure(s) Performed: Procedure(s) (LRB): COLONOSCOPY WITH PROPOFOL (N/A) POLYPECTOMY BIOPSY  Patient location during evaluation: PACU Anesthesia Type: MAC Level of consciousness: awake and alert and oriented Pain management: pain level controlled Vital Signs Assessment: post-procedure vital signs reviewed and stable Respiratory status: spontaneous breathing and patient connected to face mask oxygen Cardiovascular status: stable Postop Assessment: no signs of nausea or vomiting Anesthetic complications: no    Last Vitals:  Filed Vitals:   05/04/15 1240 05/04/15 1245  BP: 140/81 138/81  Temp:    Resp: 18 24    Last Pain: There were no vitals filed for this visit.               Monti Jilek A

## 2015-05-04 NOTE — H&P (Signed)
  Primary Care Physician:  Evelina Dun, FNP Primary Gastroenterologist:  Dr. Oneida Alar  Pre-Procedure History & Physical: HPI:  Logan Price is a 70 y.o. male here for  BRBPR.  Past Medical History  Diagnosis Date  . Diverticulitis   . HTN (hypertension)   . Hyperlipidemia   . GERD (gastroesophageal reflux disease)     Past Surgical History  Procedure Laterality Date  . None to date      As of 04/14/15    Prior to Admission medications   Medication Sig Start Date End Date Taking? Authorizing Provider  amLODipine (NORVASC) 10 MG tablet Take 1 tablet (10 mg total) by mouth daily. 04/22/15  Yes Sharion Balloon, FNP  cholecalciferol (VITAMIN D) 1000 UNITS tablet Take 1,000 Units by mouth daily.   Yes Historical Provider, MD  Multiple Vitamin (MULTIVITAMIN WITH MINERALS) TABS tablet Take 1 tablet by mouth daily.   Yes Historical Provider, MD  Na Sulfate-K Sulfate-Mg Sulf (SUPREP BOWEL PREP) SOLN Take 1 kit by mouth as directed. 04/14/15  Yes Danie Binder, MD  pantoprazole (PROTONIX) 20 MG tablet TAKE 1 TABLET TWICE A DAY 05/03/15  Yes Sharion Balloon, FNP  simvastatin (ZOCOR) 20 MG tablet TAKE 1 TABLET (20 MG TOTAL) BY MOUTH AT BEDTIME. 04/22/15  Yes Sharion Balloon, FNP    Allergies as of 04/14/2015  . (No Known Allergies)    Family History  Problem Relation Age of Onset  . Cancer Mother   . Hypertension Father   . Hypertension Sister   . Hypertension Brother   . Hypertension Sister   . Stroke Brother     hemorrhagic  . Colon cancer Neg Hx     Social History   Social History  . Marital Status: Married    Spouse Name: N/A  . Number of Children: N/A  . Years of Education: N/A   Occupational History  . Not on file.   Social History Main Topics  . Smoking status: Former Smoker    Quit date: 10/09/2006  . Smokeless tobacco: Never Used  . Alcohol Use: Yes     Comment: 7 oz of vodka per night  . Drug Use: No  . Sexual Activity: Not on file   Other Topics Concern  .  Not on file   Social History Narrative    Review of Systems: See HPI, otherwise negative ROS   Physical Exam: BP 123/79 mmHg  Temp(Src) 97.6 F (36.4 C) (Oral)  Resp 11  SpO2 100% General:   Alert,  pleasant and cooperative in NAD Head:  Normocephalic and atraumatic. Neck:  Supple; Lungs:  Clear throughout to auscultation.    Heart:  Regular rate and rhythm. Abdomen:  Soft, nontender and nondistended. Normal bowel sounds, without guarding, and without rebound.   Neurologic:  Alert and  oriented x4;  grossly normal neurologically.  Impression/Plan:    BRBPR  PLAN: TCS TODAY

## 2015-05-04 NOTE — Discharge Instructions (Signed)
The last part of your small intestines does not look normal. I took biopsies. You had 2 small polyp removed. You have internal hemorrhoids and diverticulosis IN YOUR RIGHT AND LEFT COLON.   FOLLOW A HIGH FIBER DIET. AVOID ITEMS THAT CAUSE BLOATING. SEE INFO BELOW.  YOUR BIOPSY RESULTS WILL BE AVAILABLE IN MY CHART FEB 10 AND  MY OFFICE WILL CONTACT YOU IN 10-14 DAYS WITH YOUR RESULTS.   Next colonoscopy in 1-3 years.   Colonoscopy Care After Read the instructions outlined below and refer to this sheet in the next week. These discharge instructions provide you with general information on caring for yourself after you leave the hospital. While your treatment has been planned according to the most current medical practices available, unavoidable complications occasionally occur. If you have any problems or questions after discharge, call DR. FIELDS, [phone].  ACTIVITY  You may resume your regular activity, but move at a slower pace for the next 24 hours.   Take frequent rest periods for the next 24 hours.   Walking will help get rid of the air and reduce the bloated feeling in your belly (abdomen).   No driving for 24 hours (because of the medicine (anesthesia) used during the test).   You may shower.   Do not sign any important legal documents or operate any machinery for 24 hours (because of the anesthesia used during the test).    NUTRITION  Drink plenty of fluids.   You may resume your normal diet as instructed by your doctor.   Begin with a light meal and progress to your normal diet. Heavy or fried foods are harder to digest and may make you feel sick to your stomach (nauseated).   Avoid alcoholic beverages for 24 hours or as instructed.    MEDICATIONS  You may resume your normal medications.   WHAT YOU CAN EXPECT TODAY  Some feelings of bloating in the abdomen.   Passage of more gas than usual.   Spotting of blood in your stool or on the toilet paper  .    IF YOU HAD POLYPS REMOVED DURING THE COLONOSCOPY:  Eat a soft diet IF YOU HAVE NAUSEA, BLOATING, ABDOMINAL PAIN, OR VOMITING.    FINDING OUT THE RESULTS OF YOUR TEST Not all test results are available during your visit. DR. Oneida Alar WILL CALL YOU WITHIN 14 DAYS OF YOUR PROCEDUE WITH YOUR RESULTS. Do not assume everything is normal if you have not heard from DR. FIELDS, CALL HER OFFICE AT [phone].  SEEK IMMEDIATE MEDICAL ATTENTION AND CALL THE OFFICE: [phone] IF:  You have more than a spotting of blood in your stool.   Your belly is swollen (abdominal distention).   You are nauseated or vomiting.   You have a temperature over 101F.   You have abdominal pain or discomfort that is severe or gets worse throughout the day.  Polyps, Colon  A polyp is extra tissue that grows inside your body. Colon polyps grow in the large intestine. The large intestine, also called the colon, is part of your digestive system. It is a long, hollow tube at the end of your digestive tract where your body makes and stores stool. Most polyps are not dangerous. They are benign. This means they are not cancerous. But over time, some types of polyps can turn into cancer. Polyps that are smaller than a pea are usually not harmful. But larger polyps could someday become or may already be cancerous. To be safe, doctors remove  all polyps and test them.    PREVENTION There is not one sure way to prevent polyps. You might be able to lower your risk of getting them if you:  Eat more fruits and vegetables and less fatty food.   Do not smoke.   Avoid alcohol.   Exercise every day.   Lose weight if you are overweight.   Eating more calcium and folate can also lower your risk of getting polyps. Some foods that are rich in calcium are milk, cheese, and broccoli. Some foods that are rich in folate are chickpeas, kidney beans, and spinach.   High-Fiber Diet A high-fiber diet changes your normal diet to include  more whole grains, legumes, fruits, and vegetables. Changes in the diet involve replacing refined carbohydrates with unrefined foods. The calorie level of the diet is essentially unchanged. The Dietary Reference Intake (recommended amount) for adult males is 38 grams per day. For adult females, it is 25 grams per day. Pregnant and lactating women should consume 28 grams of fiber per day. Fiber is the intact part of a plant that is not broken down during digestion. Functional fiber is fiber that has been isolated from the plant to provide a beneficial effect in the body. PURPOSE  Increase stool bulk.   Ease and regulate bowel movements.   Lower cholesterol.  INDICATIONS THAT YOU NEED MORE FIBER  Constipation and hemorrhoids.   Uncomplicated diverticulosis (intestine condition) and irritable bowel syndrome.   Weight management.   As a protective measure against hardening of the arteries (atherosclerosis), diabetes, and cancer.   GUIDELINES FOR INCREASING FIBER IN THE DIET  Start adding fiber to the diet slowly. A gradual increase of about 5 more grams (2 slices of whole-wheat bread, 2 servings of most fruits or vegetables, or 1 bowl of high-fiber cereal) per day is best. Too rapid an increase in fiber may result in constipation, flatulence, and bloating.   Drink enough water and fluids to keep your urine clear or pale yellow. Water, juice, or caffeine-free drinks are recommended. Not drinking enough fluid may cause constipation.   Eat a variety of high-fiber foods rather than one type of fiber.   Try to increase your intake of fiber through using high-fiber foods rather than fiber pills or supplements that contain small amounts of fiber.   The goal is to change the types of food eaten. Do not supplement your present diet with high-fiber foods, but replace foods in your present diet.   INCLUDE A VARIETY OF FIBER SOURCES  Replace refined and processed grains with whole grains, canned  fruits with fresh fruits, and incorporate other fiber sources. White rice, white breads, and most bakery goods contain little or no fiber.   Brown whole-grain rice, buckwheat oats, and many fruits and vegetables are all good sources of fiber. These include: broccoli, Brussels sprouts, cabbage, cauliflower, beets, sweet potatoes, white potatoes (skin on), carrots, tomatoes, eggplant, squash, berries, fresh fruits, and dried fruits.   Cereals appear to be the richest source of fiber. Cereal fiber is found in whole grains and bran. Bran is the fiber-rich outer coat of cereal grain, which is largely removed in refining. In whole-grain cereals, the bran remains. In breakfast cereals, the largest amount of fiber is found in those with "bran" in their names. The fiber content is sometimes indicated on the label.   You may need to include additional fruits and vegetables each day.   In baking, for 1 cup white flour, you may use  the following substitutions:   1 cup whole-wheat flour minus 2 tablespoons.   1/2 cup white flour plus 1/2 cup whole-wheat flour.   Diverticulosis Diverticulosis is a common condition that develops when small pouches (diverticula) form in the wall of the colon. The risk of diverticulosis increases with age. It happens more often in people who eat a low-fiber diet. Most individuals with diverticulosis have no symptoms. Those individuals with symptoms usually experience belly (abdominal) pain, constipation, or loose stools (diarrhea).  HOME CARE INSTRUCTIONS  Increase the amount of fiber in your diet as directed by your caregiver or dietician. This may reduce symptoms of diverticulosis.   Drink at least 6 to 8 glasses of water each day to prevent constipation.   Try not to strain when you have a bowel movement.   Avoiding nuts and seeds to prevent complications is NOT NECESSARY.    FOODS HAVING HIGH FIBER CONTENT INCLUDE:  Fruits. Apple, peach, pear, tangerine, raisins,  prunes.   Vegetables. Brussels sprouts, asparagus, broccoli, cabbage, carrot, cauliflower, romaine lettuce, spinach, summer squash, tomato, winter squash, zucchini.   Starchy Vegetables. Baked beans, kidney beans, lima beans, split peas, lentils, potatoes (with skin).   Grains. Whole wheat bread, brown rice, bran flake cereal, plain oatmeal, white rice, shredded wheat, bran muffins.   SEEK IMMEDIATE MEDICAL CARE IF:  You develop increasing pain or severe bloating.   You have an oral temperature above 101F.   You develop vomiting or bowel movements that are bloody or black.   Hemorrhoids Hemorrhoids are dilated (enlarged) veins around the rectum. Sometimes clots will form in the veins. This makes them swollen and painful. These are called thrombosed hemorrhoids. Causes of hemorrhoids include:  Constipation.   Straining to have a bowel movement.   HEAVY LIFTING  HOME CARE INSTRUCTIONS  Eat a well balanced diet and drink 6 to 8 glasses of water every day to avoid constipation. You may also use a bulk laxative.   Avoid straining to have bowel movements.   Keep anal area dry and clean.   Do not use a donut shaped pillow or sit on the toilet for long periods. This increases blood pooling and pain.   Move your bowels when your body has the urge; this will require less straining and will decrease pain and pressure.     PATIENT INSTRUCTIONS POST-ANESTHESIA  IMMEDIATELY FOLLOWING SURGERY:  Do not drive or operate machinery for the first twenty four hours after surgery.  Do not make any important decisions for twenty four hours after surgery or while taking narcotic pain medications or sedatives.  If you develop intractable nausea and vomiting or a severe headache please notify your doctor immediately.  FOLLOW-UP:  Please make an appointment with your surgeon as instructed. You do not need to follow up with anesthesia unless specifically instructed to do so.  WOUND CARE  INSTRUCTIONS (if applicable):  Keep a dry clean dressing on the anesthesia/puncture wound site if there is drainage.  Once the wound has quit draining you may leave it open to air.  Generally you should leave the bandage intact for twenty four hours unless there is drainage.  If the epidural site drains for more than 36-48 hours please call the anesthesia department.  QUESTIONS?:  Please feel free to call your physician or the hospital operator if you have any questions, and they will be happy to assist you.

## 2015-05-04 NOTE — Anesthesia Preprocedure Evaluation (Addendum)
Anesthesia Evaluation  Patient identified by MRN, date of birth, ID band Patient awake    Reviewed: Allergy & Precautions, NPO status , Patient's Chart, lab work & pertinent test results  Airway Mallampati: II  TM Distance: >3 FB     Dental  (+) Teeth Intact, Dental Advisory Given   Pulmonary former smoker,    breath sounds clear to auscultation       Cardiovascular hypertension, Pt. on medications  Rhythm:Regular Rate:Normal     Neuro/Psych    GI/Hepatic GERD  ,  Endo/Other    Renal/GU      Musculoskeletal   Abdominal   Peds  Hematology   Anesthesia Other Findings   Reproductive/Obstetrics                            Anesthesia Physical Anesthesia Plan  ASA: II  Anesthesia Plan: MAC   Post-op Pain Management:    Induction: Intravenous  Airway Management Planned: Simple Face Mask  Additional Equipment:   Intra-op Plan:   Post-operative Plan:   Informed Consent: I have reviewed the patients History and Physical, chart, labs and discussed the procedure including the risks, benefits and alternatives for the proposed anesthesia with the patient or authorized representative who has indicated his/her understanding and acceptance.     Plan Discussed with:   Anesthesia Plan Comments:         Anesthesia Quick Evaluation

## 2015-05-04 NOTE — Progress Notes (Signed)
REVIEWED-NO ADDITIONAL RECOMMENDATIONS. 

## 2015-05-05 ENCOUNTER — Telehealth: Payer: Self-pay

## 2015-05-05 NOTE — Telephone Encounter (Signed)
Insurance prior authorized Pantoprazole

## 2015-05-06 ENCOUNTER — Encounter (HOSPITAL_COMMUNITY): Payer: Self-pay | Admitting: Gastroenterology

## 2015-05-10 ENCOUNTER — Other Ambulatory Visit: Payer: Self-pay | Admitting: Family

## 2015-05-17 ENCOUNTER — Telehealth: Payer: Self-pay | Admitting: Gastroenterology

## 2015-05-17 NOTE — Telephone Encounter (Signed)
Please call pt. HE had simple adenomas removed. THE BIOPSIES OF HIS SMALL BOWEL ARE NORMAL.  FOLLOW A HIGH FIBER DIET. TCS IN 5 YEARS.

## 2015-05-18 NOTE — Telephone Encounter (Signed)
Reminder in epic °

## 2015-05-18 NOTE — Telephone Encounter (Signed)
Pt is aware.  

## 2015-06-02 ENCOUNTER — Ambulatory Visit (INDEPENDENT_AMBULATORY_CARE_PROVIDER_SITE_OTHER): Payer: Medicare Other | Admitting: Internal Medicine

## 2015-06-02 ENCOUNTER — Encounter (INDEPENDENT_AMBULATORY_CARE_PROVIDER_SITE_OTHER): Payer: Self-pay | Admitting: Internal Medicine

## 2015-06-02 VITALS — BP 132/70 | HR 72 | Temp 98.5°F | Ht 69.0 in | Wt 163.7 lb

## 2015-06-02 DIAGNOSIS — B1920 Unspecified viral hepatitis C without hepatic coma: Secondary | ICD-10-CM | POA: Insufficient documentation

## 2015-06-02 NOTE — Patient Instructions (Signed)
OV in 6 months. Hep C quaint today. Hep A and B vaccine Rx given to patient.

## 2015-06-02 NOTE — Progress Notes (Signed)
Subjective:    Patient ID: Logan Price, male    DOB: [birth year], 70 y.o.   MRN: [id number]  HPIReferred by Rosamaria Lints NP for Hepatitis C. 04/21/2014 Hep C antibody positive. Hep C quaint undetected. Liver enzymes are normal.  He has past hx of IV drugs. No drugs in over 40 yrs. Has not drank etoh since January. Hx of etoh abuse in the past. No prior hx of blood transfusion. No prior hx of jaundice.  He does not have any tattoos.  Appetite is good. No weight loss.  Usually has a BM    CMP Latest Ref Rng 04/22/2015 03/30/2015 10/09/2014  Glucose 65 - 99 mg/dL 86 409(W) 119(J)  BUN 8 - 27 mg/dL 14 18 11   Creatinine 0.76 - 1.27 mg/dL 4.78 2.95 6.21  Sodium 134 - 144 mmol/L 143 143 142  Potassium 3.5 - 5.2 mmol/L 3.9 3.6 3.8  Chloride 96 - 106 mmol/L 103 108 101  CO2 18 - 29 mmol/L 25 28 26   Calcium 8.6 - 10.2 mg/dL 9.3 9.1 9.5  Total Protein 6.0 - 8.5 g/dL 6.8 - 7.0  Total Bilirubin 0.0 - 1.2 mg/dL 0.4 - 0.4  Alkaline Phos 39 - 117 IU/L 52 - 56  AST 0 - 40 IU/L 17 - 22  ALT 0 - 44 IU/L 15 - 15    CBC    Component Value Date/Time   WBC 7.5 03/30/2015 1515   WBC 10.7* 11/03/2013 1409   RBC 4.61 03/30/2015 1515   RBC 5.0 11/03/2013 1409   HGB 14.0 03/30/2015 1515   HGB 14.8 11/03/2013 1409   HCT 41.7 03/30/2015 1515   HCT 45.5 11/03/2013 1409   PLT 236 03/30/2015 1515   MCV 90.5 03/30/2015 1515   MCV 90.8 11/03/2013 1409   MCH 30.4 03/30/2015 1515   MCH 29.5 11/03/2013 1409   MCHC 33.6 03/30/2015 1515   MCHC 32.5 11/03/2013 1409   RDW 14.8 03/30/2015 1515   LYMPHSABS 1.9 11/06/2009 0537   MONOABS 0.6 11/06/2009 0537   EOSABS 0.1 11/06/2009 0537   BASOSABS 0.1 11/06/2009 0537       Review of Systems Past Medical History  Diagnosis Date  . Diverticulitis   . HTN (hypertension)   . Hyperlipidemia   . GERD (gastroesophageal reflux disease)   . Hepatitis C     Past Surgical History  Procedure Laterality Date  . None to date      As of 04/14/15  .  Colonoscopy with propofol N/A 05/04/2015    Procedure: COLONOSCOPY WITH PROPOFOL;  Surgeon: West Bali, MD;  Location: [facility];  Service: Endoscopy;  Laterality: N/A;  100  . Polypectomy  05/04/2015    Procedure: POLYPECTOMY;  Surgeon: West Bali, MD;  Location: [facility];  Service: Endoscopy;;  descending colon polyp, ascending colon polyp  . Biopsy  05/04/2015    Procedure: BIOPSY;  Surgeon: West Bali, MD;  Location: [facility];  Service: Endoscopy;;  bx's of ileocecal valve     No Known Allergies  Current Outpatient Prescriptions on File Prior to Visit  Medication Sig Dispense Refill  . amLODipine (NORVASC) 10 MG tablet Take 1 tablet (10 mg total) by mouth daily. 90 tablet 1  . cholecalciferol (VITAMIN D) 1000 UNITS tablet Take 1,000 Units by mouth daily.    . Multiple Vitamin (MULTIVITAMIN WITH MINERALS) TABS tablet Take 1 tablet by mouth daily.    . pantoprazole (PROTONIX) 20 MG tablet TAKE 1  TABLET TWICE A DAY 60 tablet 5  . simvastatin (ZOCOR) 20 MG tablet TAKE 1 TABLET (20 MG TOTAL) BY MOUTH AT BEDTIME. 90 tablet 2   No current facility-administered medications on file prior to visit.        Objective:   Physical Exam Blood pressure 132/70, pulse 72, temperature 98.5 F (36.9 C), height 5\' 9"  (1.753 m), weight 163 lb 11.2 oz (74.254 kg). Alert and oriented. Skin warm and dry. Oral mucosa is moist.   . Sclera anicteric, conjunctivae is pink. Thyroid not enlarged. No cervical lymphadenopathy. Lungs clear. Heart regular rate and rhythm.  Abdomen is soft. Bowel sounds are positive. No hepatomegaly. No abdominal masses felt. No tenderness.  No edema to lower extremities.         Assessment & Plan:  Hepatitis C. Viral load undetected.  Will repeat Hep C quaint.  Hepatitis C brochure given to patient. Rx for Hepatitis A and B vaccine given to patient to take to his PCP.  OV in 6 months

## 2015-06-03 ENCOUNTER — Ambulatory Visit (INDEPENDENT_AMBULATORY_CARE_PROVIDER_SITE_OTHER): Payer: Medicare Other | Admitting: *Deleted

## 2015-06-03 DIAGNOSIS — Z23 Encounter for immunization: Secondary | ICD-10-CM

## 2015-06-03 NOTE — Progress Notes (Signed)
Patient tolerated well.

## 2015-06-03 NOTE — Patient Instructions (Signed)
Hepatitis A; Hepatitis B Vaccine injection What is this medicine? HEPATITIS A VACCINE; HEPATITIS B VACCINE (hep uh TAHY tis A vak SEEN; hep uh TAHY tis B vak SEEN) is a vaccine to protect from an infection with the hepatitis A and B virus. This vaccine does not contain the live viruses. It will not cause a hepatitis infection. This medicine may be used for other purposes; ask your health care provider or pharmacist if you have questions. What should I tell my health care provider before I take this medicine? They need to know if you have any of these conditions: -bleeding disorder -fever or infection -heart disease -immune system problems -an unusual or allergic reaction to hepatitis A or B vaccine, neomycin, yeast, thimerosal, other medicines, foods, dyes, or preservatives -pregnant or trying to get pregnant -breast-feeding How should I use this medicine? This vaccine is for injection into a muscle. It is given by a health care professional. A copy of Vaccine Information Statements will be given before each vaccination. Read this sheet carefully each time. The sheet may change frequently. Talk to your pediatrician regarding the use of this medicine in children. Special care may be needed. Overdosage: If you think you have taken too much of this medicine contact a poison control center or emergency room at once. NOTE: This medicine is only for you. Do not share this medicine with others. What if I miss a dose? It is important not to miss your dose. Call your doctor or health care professional if you are unable to keep an appointment. What may interact with this medicine? -medicines that suppress your immune function like adalimumab, anakinra, infliximab -medicines to treat cancer -steroid medicines like prednisone or cortisone This list may not describe all possible interactions. Give your health care provider a list of all the medicines, herbs, non-prescription drugs, or dietary supplements  you use. Also tell them if you smoke, drink alcohol, or use illegal drugs. Some items may interact with your medicine. What should I watch for while using this medicine? See your health care provider for all shots of this vaccine as directed. You must have 3 to 4 shots of this vaccine for protection from hepatitis A and B infection. Tell your doctor right away if you have any serious or unusual side effects after getting this vaccine. What side effects may I notice from receiving this medicine? Side effects that you should report to your doctor or health care professional as soon as possible: -allergic reactions like skin rash, itching or hives, swelling of the face, lips, or tongue -breathing problems -confused, irritated -fast, irregular heartbeat -flu-like syndrome -numb, tingling pain -seizures Side effects that usually do not require medical attention (report to your doctor or health care professional if they continue or are bothersome): -diarrhea -fever -headache -loss of appetite -muscle pain -nausea -pain, redness, swelling, or irritation at site where injected -tiredness This list may not describe all possible side effects. Call your doctor for medical advice about side effects. You may report side effects to FDA at 1-800-FDA-1088. Where should I keep my medicine? This drug is given in a hospital or clinic and will not be stored at home. NOTE: This sheet is a summary. It may not cover all possible information. If you have questions about this medicine, talk to your doctor, pharmacist, or health care provider.    2016, [employer]. (2007-07-26 15:21:37)  

## 2015-06-04 LAB — HEPATITIS C RNA QUANTITATIVE: HCV QUANT: NOT DETECTED [IU]/mL (ref <15)

## 2015-06-08 ENCOUNTER — Telehealth: Payer: Self-pay | Admitting: *Deleted

## 2015-06-08 NOTE — Telephone Encounter (Signed)
Patient had Twinrix last week ordered by his GI doctor. Once ran through [employer] the patient will owe $45.50. Spoke with patient by phone and advised him of this and that he will receive a bill.  Also explained that his 2nd and 3rd injections may have an out-of-pocket expense as well.  Patient stated understanding.

## 2015-07-06 ENCOUNTER — Ambulatory Visit (INDEPENDENT_AMBULATORY_CARE_PROVIDER_SITE_OTHER): Payer: Medicare Other | Admitting: *Deleted

## 2015-07-06 DIAGNOSIS — Z23 Encounter for immunization: Secondary | ICD-10-CM

## 2015-07-06 NOTE — Progress Notes (Signed)
Pt given Twinrix HepA/HepB injection IM right deltoid and tolerated well.

## 2015-07-12 ENCOUNTER — Emergency Department (HOSPITAL_COMMUNITY)
Admission: EM | Admit: 2015-07-12 | Discharge: 2015-07-12 | Disposition: A | Discharge status: Home / Self Care | Payer: Medicare Other | Attending: Emergency Medicine | Admitting: Emergency Medicine

## 2015-07-12 ENCOUNTER — Encounter (HOSPITAL_COMMUNITY): Payer: Self-pay | Admitting: Emergency Medicine

## 2015-07-12 DIAGNOSIS — I10 Essential (primary) hypertension: Secondary | ICD-10-CM | POA: Insufficient documentation

## 2015-07-12 DIAGNOSIS — Z79899 Other long term (current) drug therapy: Secondary | ICD-10-CM | POA: Insufficient documentation

## 2015-07-12 DIAGNOSIS — K625 Hemorrhage of anus and rectum: Secondary | ICD-10-CM

## 2015-07-12 DIAGNOSIS — Z87891 Personal history of nicotine dependence: Secondary | ICD-10-CM | POA: Diagnosis not present

## 2015-07-12 DIAGNOSIS — E785 Hyperlipidemia, unspecified: Secondary | ICD-10-CM | POA: Insufficient documentation

## 2015-07-12 LAB — CBC WITH DIFFERENTIAL/PLATELET
BASOS ABS: 0 10*3/uL (ref 0.0–0.1)
BASOS PCT: 1 %
EOS ABS: 0.1 10*3/uL (ref 0.0–0.7)
EOS PCT: 1 %
HCT: 40.4 % (ref 39.0–52.0)
HEMOGLOBIN: 13.6 g/dL (ref 13.0–17.0)
Lymphocytes Relative: 34 %
Lymphs Abs: 2.1 10*3/uL (ref 0.7–4.0)
MCH: 30 pg (ref 26.0–34.0)
MCHC: 33.7 g/dL (ref 30.0–36.0)
MCV: 89 fL (ref 78.0–100.0)
Monocytes Absolute: 0.5 10*3/uL (ref 0.1–1.0)
Monocytes Relative: 9 %
NEUTROS PCT: 55 %
Neutro Abs: 3.5 10*3/uL (ref 1.7–7.7)
PLATELETS: 221 10*3/uL (ref 150–400)
RBC: 4.54 MIL/uL (ref 4.22–5.81)
RDW: 14.1 % (ref 11.5–15.5)
WBC: 6.2 10*3/uL (ref 4.0–10.5)

## 2015-07-12 LAB — BASIC METABOLIC PANEL
Anion gap: 6 (ref 5–15)
BUN: 13 mg/dL (ref 6–20)
CHLORIDE: 109 mmol/L (ref 101–111)
CO2: 29 mmol/L (ref 22–32)
CREATININE: 1.19 mg/dL (ref 0.61–1.24)
Calcium: 8.7 mg/dL — ABNORMAL LOW (ref 8.9–10.3)
Glucose, Bld: 107 mg/dL — ABNORMAL HIGH (ref 65–99)
POTASSIUM: 3.8 mmol/L (ref 3.5–5.1)
SODIUM: 144 mmol/L (ref 135–145)

## 2015-07-12 NOTE — Discharge Instructions (Signed)

## 2015-07-12 NOTE — ED Notes (Signed)
Noted blood with brown stool this am.  Pt says is was "a little bit".  Denies any pain at this time.  history of hemorrhoids.

## 2015-07-12 NOTE — ED Provider Notes (Signed)
CSN: [id number]     Arrival date & time 07/12/15  1320 History   First MD Initiated Contact with Patient 07/12/15 1611     Chief Complaint  Patient presents with  . Rectal Bleeding      Patient is a 70 y.o. male presenting with hematochezia. The history is provided by the patient.  Rectal Bleeding Associated symptoms: no abdominal pain and no vomiting    patient states he had a normal bowel movement today and had some blood on the toilet paper when he white. States his red blood. He feels fine otherwise. He was seen by Dr. fields around 4 months ago and had colonoscopy at that time for similar symptoms. No black stools. No other lightheadedness or dizziness. No other bleeding. No dominant. He had 2 polyps removed and had hemorrhoids. Also had diverticulosis.  Past Medical History  Diagnosis Date  . Diverticulitis   . HTN (hypertension)   . Hyperlipidemia   . GERD (gastroesophageal reflux disease)   . Hepatitis C    Past Surgical History  Procedure Laterality Date  . None to date      As of 04/14/15  . Colonoscopy with propofol N/A 05/04/2015    Procedure: COLONOSCOPY WITH PROPOFOL;  Surgeon: Danie Binder, MD;  Location: [facility];  Service: Endoscopy;  Laterality: N/A;  100  . Polypectomy  05/04/2015    Procedure: POLYPECTOMY;  Surgeon: Danie Binder, MD;  Location: [facility];  Service: Endoscopy;;  descending colon polyp, ascending colon polyp  . Biopsy  05/04/2015    Procedure: BIOPSY;  Surgeon: Danie Binder, MD;  Location: [facility];  Service: Endoscopy;;  bx's of ileocecal valve    Family History  Problem Relation Age of Onset  . Cancer Mother   . Hypertension Father   . Hypertension Sister   . Hypertension Brother   . Hypertension Sister   . Stroke Brother     hemorrhagic  . Colon cancer Neg Hx    Social History  Substance Use Topics  . Smoking status: Former Smoker    Quit date: 10/09/2006  . Smokeless tobacco: Never Used  . Alcohol Use: Yes   Comment: 7 oz of vodka per night    Review of Systems  Constitutional: Negative for activity change and appetite change.  Eyes: Negative for pain.  Respiratory: Negative for chest tightness and shortness of breath.   Cardiovascular: Negative for chest pain and leg swelling.  Gastrointestinal: Positive for hematochezia and anal bleeding. Negative for nausea, vomiting, abdominal pain and diarrhea.  Genitourinary: Negative for flank pain.  Musculoskeletal: Negative for back pain and neck stiffness.  Skin: Negative for rash.  Neurological: Negative for weakness, numbness and headaches.  Psychiatric/Behavioral: Negative for behavioral problems.      Allergies  Review of patient's allergies indicates no known allergies.  Home Medications   Prior to Admission medications   Medication Sig Start Date End Date Taking? Authorizing Provider  amLODipine (NORVASC) 10 MG tablet Take 1 tablet (10 mg total) by mouth daily. 04/22/15  Yes Sharion Balloon, FNP  cholecalciferol (VITAMIN D) 1000 UNITS tablet Take 1,000 Units by mouth daily.   Yes Historical Provider, MD  Multiple Vitamin (MULTIVITAMIN WITH MINERALS) TABS tablet Take 1 tablet by mouth daily.   Yes Historical Provider, MD  pantoprazole (PROTONIX) 20 MG tablet TAKE 1 TABLET TWICE A DAY 05/03/15  Yes Sharion Balloon, FNP  simvastatin (ZOCOR) 20 MG tablet TAKE 1 TABLET (20 MG TOTAL)  BY MOUTH AT BEDTIME. 04/22/15  Yes Christy A Hawks, FNP   BP 123/67 mmHg  Pulse 80  Temp(Src) 98.2 F (36.8 C) (Oral)  Resp 14  Wt 162 lb (73.483 kg)  SpO2 98% Physical Exam  Constitutional: He appears well-developed.  Cardiovascular: Normal rate.   Pulmonary/Chest: Effort normal.  Abdominal: Soft. There is no tenderness.  Genitourinary:  Large external hemorrhoids. No clear bleeding.  Skin: Skin is warm.    ED Course  Procedures (including critical care time) Labs Review Labs Reviewed  BASIC METABOLIC PANEL - Abnormal; Notable for the following:     Glucose, Bld 107 (*)    Calcium 8.7 (*)    All other components within normal limits  CBC WITH DIFFERENTIAL/PLATELET    Imaging Review No results found. I have personally reviewed and evaluated these images and lab results as part of my medical decision-making.   EKG Interpretation None      MDM   Final diagnoses:  Rectal bleeding    Patient with possible GI bleeding. Does have external hemorrhoids which could be the cause but did not see frank area of the bleeding. Recent colonoscopy is reassuring. Will follow-up with GI as needed. Hemoglobin is stable.    Davonna Belling, MD 07/12/15 [phone]

## 2015-07-16 ENCOUNTER — Encounter: Payer: Self-pay | Admitting: Gastroenterology

## 2015-07-16 ENCOUNTER — Ambulatory Visit (INDEPENDENT_AMBULATORY_CARE_PROVIDER_SITE_OTHER): Payer: Medicare Other | Admitting: Gastroenterology

## 2015-07-16 VITALS — BP 110/61 | HR 76 | Temp 97.2°F | Ht 69.0 in | Wt 161.8 lb

## 2015-07-16 DIAGNOSIS — Z86010 Personal history of colonic polyps: Secondary | ICD-10-CM | POA: Diagnosis not present

## 2015-07-16 DIAGNOSIS — K625 Hemorrhage of anus and rectum: Secondary | ICD-10-CM

## 2015-07-16 MED ORDER — HYDROCORTISONE 2.5 % RE CREA: 1.0000 "application " | TOPICAL_CREAM | Freq: Two times a day (BID) | RECTAL | Status: DC

## 2015-07-16 NOTE — Assessment & Plan Note (Signed)
Recent low-volume episode likely secondary to known internal hemorrhoids. CBC normal. Resolved at time of visit. Anusol cream sent to pharmacy to use for 5-7 days. Discussed hemorrhoid banding, but patient is not interested at this time. Will have him return in 6 months and contact us if any further episodes. As of note, he recently saw another GI practice in town with [facility] for Hep C treatment. I discussed that we treat Hep C as well, and it looks like this was inadvertently scheduled with a different office at request of PCP due to positive Hep C antibody. His viral load is undetectable and is in the process of Hep A/B vaccinations, which is good. We will see him in 6 months. Discussed streamlining care and staying with one practice if possible.

## 2015-07-16 NOTE — Assessment & Plan Note (Signed)
Surveillance in 5 years.

## 2015-07-16 NOTE — Patient Instructions (Signed)
You may take the hemorrhoid cream twice a day per rectum for 5-7 days, then give it a break.   If you have recurrent bleeding, let us know. We can schedule a hemorrhoid banding for you in the future if this continues to be an issue.    We will see you back in 6 months.

## 2015-07-16 NOTE — Progress Notes (Signed)
Referring Provider: Sharion Balloon, FNP Primary Care Physician:  Evelina Dun, FNP  Primary GI: Dr. Oneida Alar   Chief Complaint  Patient presents with  . Rectal Bleeding    HPI:   Logan Price is a 70 y.o. male presenting today with rectal bleeding, known internal hemorrhoids on colonoscopy earlier this year and due for surveillance in 5 years. Actually saw Deberah Castle, NP with [facility] after referral from PCP for Hep C antibody positive. His viral load was undetectable.   ED rectal exam noted large external hemorrhoids without obvious bleeding. In process of hepatitis A/B vaccinations. No further rectal bleeding. Was one episode on tissue. No rectal pain, burning, itching. No constipation or diarrhea.     Past Medical History  Diagnosis Date  . Diverticulitis   . HTN (hypertension)   . Hyperlipidemia   . GERD (gastroesophageal reflux disease)   . Hepatitis C     Past Surgical History  Procedure Laterality Date  . None to date      As of 04/14/15  . Colonoscopy with propofol N/A 05/04/2015    Dr. Oneida Alar: normal appearing ileum with prominent IC valve with tubular adenomas, moderate diverticulosis in sigmoid colon, ascending colon, and retum. Moderate sized internal hemorrhoids. Surveillance in 5 years  . Polypectomy  05/04/2015    Procedure: POLYPECTOMY;  Surgeon: Danie Binder, MD;  [facility]: [facility];  Service: Endoscopy;;  descending colon polyp, ascending colon polyp  . Biopsy  05/04/2015    Procedure: BIOPSY;  Surgeon: Danie Binder, MD;  Location: [facility];  Service: Endoscopy;;  bx's of ileocecal valve     Current Outpatient Prescriptions  Medication Sig Dispense Refill  . amLODipine (NORVASC) 10 MG tablet Take 1 tablet (10 mg total) by mouth daily. 90 tablet 1  . cholecalciferol (VITAMIN D) 1000 UNITS tablet Take 1,000 Units by mouth daily.    . Multiple Vitamin (MULTIVITAMIN WITH MINERALS) TABS tablet Take 1 tablet by mouth daily.    . pantoprazole  (PROTONIX) 20 MG tablet TAKE 1 TABLET TWICE A DAY 60 tablet 5  . simvastatin (ZOCOR) 20 MG tablet TAKE 1 TABLET (20 MG TOTAL) BY MOUTH AT BEDTIME. 90 tablet 2   No current facility-administered medications for this visit.    Allergies as of 07/16/2015  . (No Known Allergies)    Family History  Problem Relation Age of Onset  . Cancer Mother   . Hypertension Father   . Hypertension Sister   . Hypertension Brother   . Hypertension Sister   . Stroke Brother     hemorrhagic  . Colon cancer Neg Hx     Social History   Social History  . Marital Status: Married    Spouse Name: N/A  . Number of Children: N/A  . Years of Education: N/A   Social History Main Topics  . Smoking status: Former Smoker    Quit date: 10/09/2006  . Smokeless tobacco: Never Used     Comment: quit x 8 years  . Alcohol Use: 0.0 oz/week    0 Standard drinks or equivalent per week     Comment: 7 oz of vodka per night  . Drug Use: No  . Sexual Activity: Not Asked   Other Topics Concern  . None   Social History Narrative    Review of Systems: Negative unless mentioned in HPI.   Physical Exam: BP 110/61 mmHg  Pulse 76  Temp(Src) 97.2 F (36.2 C) (Oral)  Ht '5\' 9"'$  (1.753 m)  Wt 161 lb 12.8 oz (73.392 kg)  BMI 23.88 kg/m2 General:   Alert and oriented. No distress noted. Pleasant and cooperative.  Head:  Normocephalic and atraumatic. Eyes:  Conjuctiva clear without scleral icterus. Mouth:  Oral mucosa pink and moist. Good dentition. No lesions. Abdomen:  +BS, soft, non-tender and non-distended. No rebound or guarding. No HSM or masses noted. Msk:  Symmetrical without gross deformities. Normal posture. Extremities:  Without edema. Neurologic:  Alert and  oriented x4;  grossly normal neurologically. Psych:  Alert and cooperative. Normal mood and affect.

## 2015-07-19 NOTE — Progress Notes (Signed)
cc'ed to pcp °

## 2015-08-31 ENCOUNTER — Encounter: Payer: Self-pay | Admitting: *Deleted

## 2015-09-22 ENCOUNTER — Other Ambulatory Visit: Payer: Self-pay | Admitting: *Deleted

## 2015-09-22 ENCOUNTER — Other Ambulatory Visit: Payer: Self-pay | Admitting: Family

## 2015-09-22 DIAGNOSIS — E785 Hyperlipidemia, unspecified: Secondary | ICD-10-CM

## 2015-09-22 MED ORDER — PANTOPRAZOLE SODIUM 20 MG PO TBEC: 20.0000 mg | DELAYED_RELEASE_TABLET | Freq: Two times a day (BID) | ORAL | Status: DC

## 2015-09-28 ENCOUNTER — Encounter (HOSPITAL_COMMUNITY): Payer: Self-pay | Admitting: Emergency Medicine

## 2015-09-28 ENCOUNTER — Emergency Department (HOSPITAL_COMMUNITY)
Admission: EM | Admit: 2015-09-28 | Discharge: 2015-09-28 | Disposition: A | Discharge status: Home / Self Care | Payer: Medicare Other | Attending: Emergency Medicine | Admitting: Emergency Medicine

## 2015-09-28 DIAGNOSIS — R195 Other fecal abnormalities: Secondary | ICD-10-CM | POA: Diagnosis present

## 2015-09-28 DIAGNOSIS — K625 Hemorrhage of anus and rectum: Secondary | ICD-10-CM

## 2015-09-28 DIAGNOSIS — K648 Other hemorrhoids: Secondary | ICD-10-CM | POA: Insufficient documentation

## 2015-09-28 DIAGNOSIS — Z87891 Personal history of nicotine dependence: Secondary | ICD-10-CM | POA: Insufficient documentation

## 2015-09-28 DIAGNOSIS — Z79899 Other long term (current) drug therapy: Secondary | ICD-10-CM | POA: Insufficient documentation

## 2015-09-28 DIAGNOSIS — I10 Essential (primary) hypertension: Secondary | ICD-10-CM | POA: Diagnosis not present

## 2015-09-28 DIAGNOSIS — E785 Hyperlipidemia, unspecified: Secondary | ICD-10-CM | POA: Insufficient documentation

## 2015-09-28 LAB — CBC WITH DIFFERENTIAL/PLATELET
Basophils Absolute: 0.1 10*3/uL (ref 0.0–0.1)
Basophils Relative: 1 %
EOS PCT: 2 %
Eosinophils Absolute: 0.1 10*3/uL (ref 0.0–0.7)
HEMATOCRIT: 40.4 % (ref 39.0–52.0)
Hemoglobin: 13.7 g/dL (ref 13.0–17.0)
LYMPHS ABS: 1.6 10*3/uL (ref 0.7–4.0)
LYMPHS PCT: 28 %
MCH: 29.7 pg (ref 26.0–34.0)
MCHC: 33.9 g/dL (ref 30.0–36.0)
MCV: 87.6 fL (ref 78.0–100.0)
MONO ABS: 0.5 10*3/uL (ref 0.1–1.0)
Monocytes Relative: 8 %
NEUTROS ABS: 3.6 10*3/uL (ref 1.7–7.7)
Neutrophils Relative %: 61 %
PLATELETS: 217 10*3/uL (ref 150–400)
RBC: 4.61 MIL/uL (ref 4.22–5.81)
RDW: 14.8 % (ref 11.5–15.5)
WBC: 5.8 10*3/uL (ref 4.0–10.5)

## 2015-09-28 LAB — BASIC METABOLIC PANEL
Anion gap: 8 (ref 5–15)
BUN: 14 mg/dL (ref 6–20)
CHLORIDE: 106 mmol/L (ref 101–111)
CO2: 25 mmol/L (ref 22–32)
Calcium: 8.4 mg/dL — ABNORMAL LOW (ref 8.9–10.3)
Creatinine, Ser: 1.03 mg/dL (ref 0.61–1.24)
GFR calc Af Amer: >60 mL/min (ref >60)
GFR calc non Af Amer: >60 mL/min (ref >60)
GLUCOSE: 107 mg/dL — AB (ref 65–99)
POTASSIUM: 3.3 mmol/L — AB (ref 3.5–5.1)
Sodium: 139 mmol/L (ref 135–145)

## 2015-09-28 LAB — POC OCCULT BLOOD, ED: FECAL OCCULT BLD: POSITIVE — AB

## 2015-09-28 NOTE — Discharge Instructions (Signed)
You have stable blood count and you have no active bleeding on exam. Return without fail for worsening symptoms, including severe abdominal pain, recurrent bloody stools, feeling lightheaded like passing out, severe shortness of breath or fatigue, or any other symptoms concerning to you.  Your bleeding are likely from internal hemorrhoids. We did discuss your prior history of diverticulosis, which can bleed more severely. So if you have recurrent bleeding, call Dr. Oneida Alar (your GI doctor) or return to ED.  Gastrointestinal Bleeding Gastrointestinal (GI) bleeding means there is bleeding somewhere along the digestive tract, between the mouth and anus. CAUSES  There are many different problems that can cause GI bleeding. Possible causes include:  Esophagitis. This is inflammation, irritation, or swelling of the esophagus.  Hemorrhoids.These are veins that are full of blood (engorged) in the rectum. They cause pain, inflammation, and may bleed.  Anal fissures.These are areas of painful tearing which may bleed. They are often caused by passing hard stool.  Diverticulosis.These are pouches that form on the colon over time, with age, and may bleed significantly.  Diverticulitis.This is inflammation in areas with diverticulosis. It can cause pain, fever, and bloody stools, although bleeding is rare.  Polyps and cancer. Colon cancer often starts out as precancerous polyps.  Gastritis and ulcers.Bleeding from the upper gastrointestinal tract (near the stomach) may travel through the intestines and produce black, sometimes tarry, often bad smelling stools. In certain cases, if the bleeding is fast enough, the stools may not be black, but red. This condition may be life-threatening. SYMPTOMS   Vomiting bright red blood or material that looks like coffee grounds.  Bloody, black, or tarry stools. DIAGNOSIS  Your caregiver may diagnose your condition by taking your history and performing a physical  exam. More tests may be needed, including:  X-rays and other imaging tests.  Esophagogastroduodenoscopy (EGD). This test uses a flexible, lighted tube to look at your esophagus, stomach, and small intestine.  Colonoscopy. This test uses a flexible, lighted tube to look at your colon. TREATMENT  Treatment depends on the cause of your bleeding.   For bleeding from the esophagus, stomach, small intestine, or colon, the caregiver doing your EGD or colonoscopy may be able to stop the bleeding as part of the procedure.  Inflammation or infection of the colon can be treated with medicines.  Many rectal problems can be treated with creams, suppositories, or warm baths.  Surgery is sometimes needed.  Blood transfusions are sometimes needed if you have lost a lot of blood. If bleeding is slow, you may be allowed to go home. If there is a lot of bleeding, you will need to stay in the hospital for observation. HOME CARE INSTRUCTIONS   Take any medicines exactly as prescribed.  Keep your stools soft by eating foods that are high in fiber. These foods include whole grains, legumes, fruits, and vegetables. Prunes (1 to 3 a day) work well for many people.  Drink enough fluids to keep your urine clear or pale yellow. SEEK IMMEDIATE MEDICAL CARE IF:   Your bleeding increases.  You feel lightheaded, weak, or you faint.  You have severe cramps in your back or abdomen.  You pass large blood clots in your stool.  Your problems are getting worse. MAKE SURE YOU:   Understand these instructions.  Will watch your condition.  Will get help right away if you are not doing well or get worse.   This information is not intended to replace advice given to you by  your health care provider. Make sure you discuss any questions you have with your health care provider.   Document Released: 03/10/2000 Document Revised: 02/28/2012 Document Reviewed: 08/31/2014 Elsevier Interactive Patient Education [employer]

## 2015-09-28 NOTE — ED Notes (Signed)
Pt had bright red blood in stool this morning, nausea, no abd pain, pt has hx hemorrhoids.

## 2015-09-28 NOTE — ED Provider Notes (Signed)
CSN: [id number]     Arrival date & time 09/28/15  0844 History  By signing my name below, I, Logan Price, attest that this documentation has been prepared under the direction and in the presence of Logan Dandy, MD. Electronically Signed: Judithann Price, ED Scribe. 09/28/2015. 9:18 AM.    Chief Complaint  Patient presents with  . Blood In Stools   The history is provided by the patient. No language interpreter was used.   HPI Comments: Logan Price is a 70 y.o. male with a hx of hypertension, HLD, diverticulitis, and internal hemorrhoids who presents to the Emergency Department for evaluation s/p one episode of moderate amount of bright red blood mixed in stool that occurred this am. He reports associated nausea. No LOC or syncope. Pt adds that he noticed blood on his toilet paper when he wiped. No alleviating factors noted. Pt has not tried any medications PTA. He explains that he believes his symptoms are attributed to his hemorrhoids. He denies anti-coagulant use. He also denies any abdominal pain, vomiting, diarrhea, constipation, chest pain, SOB, or weakness. Per chart review, pt's last colonoscopy was in February 2017 showing colonic polyps, diverticulosis, and internal hemorrhoids.    Past Medical History  Diagnosis Date  . Diverticulitis   . HTN (hypertension)   . Hyperlipidemia   . GERD (gastroesophageal reflux disease)   . Hepatitis C    Past Surgical History  Procedure Laterality Date  . None to date      As of 04/14/15  . Colonoscopy with propofol N/A 05/04/2015    Dr. Oneida Alar: normal appearing ileum with prominent IC valve with tubular adenomas, moderate diverticulosis in sigmoid colon, ascending colon, and retum. Moderate sized internal hemorrhoids. Surveillance in 5 years  . Polypectomy  05/04/2015    Procedure: POLYPECTOMY;  Surgeon: Danie Binder, MD;  [facility];  Service: Endoscopy;;  descending colon polyp, ascending colon polyp  . Biopsy  05/04/2015     Procedure: BIOPSY;  Surgeon: Danie Binder, MD;  [facility];  Service: Endoscopy;;  bx's of ileocecal valve    Family History  Problem Relation Age of Onset  . Cancer Mother   . Hypertension Father   . Hypertension Sister   . Hypertension Brother   . Hypertension Sister   . Stroke Brother     hemorrhagic  . Colon cancer Neg Hx    Social History  Substance Use Topics  . Smoking status: Former Smoker    Quit date: 10/09/2006  . Smokeless tobacco: Never Used     Comment: quit x 8 years  . Alcohol Use: 0.0 oz/week    0 Standard drinks or equivalent per week     Comment: 7 oz of vodka per night    Review of Systems  10/14 systems reviewed and are negative other than those stated in the HPI   Allergies  Review of patient's allergies indicates no known allergies.  Home Medications   Prior to Admission medications   Medication Sig Start Date End Date Taking? Authorizing Provider  amLODipine (NORVASC) 10 MG tablet Take 1 tablet (10 mg total) by mouth daily. 04/22/15  Yes Sharion Balloon, FNP  cholecalciferol (VITAMIN D) 1000 UNITS tablet Take 1,000 Units by mouth daily.   Yes Historical Provider, MD  Multiple Vitamin (MULTIVITAMIN WITH MINERALS) TABS tablet Take 1 tablet by mouth daily.   Yes Historical Provider, MD  pantoprazole (PROTONIX) 20 MG tablet Take 1 tablet (20 mg total)  by mouth 2 (two) times daily. 09/22/15  Yes Sharion Balloon, FNP  simvastatin (ZOCOR) 20 MG tablet TAKE 1 TABLET (20 MG TOTAL) BY MOUTH AT BEDTIME. 04/22/15  Yes Christy A Hawks, FNP   BP 129/79 mmHg  Pulse 70  Temp(Src) 97.7 F (36.5 C) (Oral)  Resp 16  Ht '5\' 9"'$  (1.753 m)  Wt 162 lb (73.483 kg)  BMI 23.91 kg/m2  SpO2 100% Physical Exam  Nursing note and vitals reviewed. Physical Exam  Nursing note and vitals reviewed. Constitutional: Well developed, well nourished, non-toxic, and in no acute distress Head: Normocephalic and atraumatic.  Mouth/Throat: Oropharynx is clear and moist.   Neck: Normal range of motion. Neck supple.  Cardiovascular: Normal rate and regular rhythm.   Pulmonary/Chest: Effort normal and breath sounds normal.  Abdominal: Soft. There is no tenderness. There is no rebound and no guarding. There is external hemorrhoids, non thrombosed. No blood or stool in rectal vault. Guaiac positive mucous.  Musculoskeletal: Normal range of motion.  Neurological: Alert, no facial droop, fluent speech, moves all extremities symmetrically Skin: Skin is warm and dry.  Psychiatric: Cooperative.   ED Course  Procedures (including critical care time) DIAGNOSTIC STUDIES: Oxygen Saturation is 96% on RA, normal by my interpretation.    COORDINATION OF CARE: 9:17 AM- Pt advised of plan for treatment and pt agrees. Pt will receive lab work for further evaluation.    Labs Review Labs Reviewed  BASIC METABOLIC PANEL - Abnormal; Notable for the following:    Potassium 3.3 (*)    Glucose, Bld 107 (*)    Calcium 8.4 (*)    All other components within normal limits  POC OCCULT BLOOD, ED - Abnormal; Notable for the following:    Fecal Occult Bld POSITIVE (*)    All other components within normal limits  CBC WITH DIFFERENTIAL/PLATELET    Logan Dandy, MD has personally reviewed and evaluated these lab results as part of her medical decision-making.  MDM   Final diagnoses:  Rectal bleeding  Internal hemorrhoids    History of diverticulosis and internal hemorrhoids who presents to the emergency department with one episode of rectal bleeding. Seems hemorrhoidal in nature as streaks of blood within the stool. No active bleeding on rectal exam, but guaiac positive brown mucus. Abdomen is soft and benign. He has normal hemoglobin. At this time I do not feel that he requires emergent endoscopy. He is not on blood thinners. Discussed stool softeners for likely hemorrhoidal bleeding. Warned that with diverticulitis as he may have more severe bleeding. He will call Dr. Oneida Alar  tomorrow to set up close follow-up, and I discussed strict return instructions for the interim. Strict return and follow-up instructions reviewed. He expressed understanding of all discharge instructions and felt comfortable with the plan of care.   I personally performed the services described in this documentation, which was scribed in my presence. The recorded information has been reviewed and is accurate.   Logan Dandy, MD 09/28/15 1045

## 2015-09-30 ENCOUNTER — Other Ambulatory Visit: Payer: Self-pay | Admitting: *Deleted

## 2015-09-30 MED ORDER — PANTOPRAZOLE SODIUM 20 MG PO TBEC: 20.0000 mg | DELAYED_RELEASE_TABLET | Freq: Two times a day (BID) | ORAL | Status: DC

## 2015-10-06 ENCOUNTER — Encounter: Payer: Self-pay | Admitting: Gastroenterology

## 2015-10-06 ENCOUNTER — Ambulatory Visit (INDEPENDENT_AMBULATORY_CARE_PROVIDER_SITE_OTHER): Payer: Medicare Other | Admitting: Gastroenterology

## 2015-10-06 VITALS — BP 111/66 | HR 79 | Temp 98.8°F | Ht 69.0 in | Wt 160.2 lb

## 2015-10-06 DIAGNOSIS — K625 Hemorrhage of anus and rectum: Secondary | ICD-10-CM | POA: Diagnosis not present

## 2015-10-06 DIAGNOSIS — K648 Other hemorrhoids: Secondary | ICD-10-CM

## 2015-10-06 NOTE — Patient Instructions (Signed)
1. We will schedule you for hemorrhoid banding with Dr. Oneida Alar.

## 2015-10-06 NOTE — Assessment & Plan Note (Addendum)
Recent rectal bleeding likely related to internal hemorrhoids. Patient is interested in hemorrhoid banding. We will bring him back on Dr. fields schedule for first session. Discussed procedure with patient, handout provided.  Patient gets nervous with ongoing rectal bleeding. Warning signs as far as when to consider going to the emergency department discussed with the patient in order to try to prevent unnecessary visits. Discussed that he would likely have intermittent rectal bleeding as long as his hemorrhoids are untreated.

## 2015-10-06 NOTE — Progress Notes (Addendum)
   REVIEWED-NO ADDITIONAL RECOMMENDATIONS.   Primary Care Physician: Evelina Dun, FNP  Primary Gastroenterologist:  Barney Drain, MD   Chief Complaint  Patient presents with  . Blood In Stools    Saw blood in stool x 1 on 09/28/2015    HPI: Logan Price is a 70 y.o. male here for further evaluation of brbpr. Recent episode of brbpr X 2, went to ER. Suspected hemorrhoidal bleeding. H/H normal. Recent colonoscopy 04/2015 with internal hemorrhoids, adenomatous colon polyps, and diverticulosis. Has tried hemorrhoids creams.   No further bleeding. Denies constipation/straining. No abdominal pain, rectal pain. No heartburn.   Current Outpatient Prescriptions  Medication Sig Dispense Refill  . amLODipine (NORVASC) 10 MG tablet Take 1 tablet (10 mg total) by mouth daily. 90 tablet 1  . cholecalciferol (VITAMIN D) 1000 UNITS tablet Take 2,000 Units by mouth daily.     . Multiple Vitamin (MULTIVITAMIN WITH MINERALS) TABS tablet Take 1 tablet by mouth daily.    . pantoprazole (PROTONIX) 20 MG tablet Take 1 tablet (20 mg total) by mouth 2 (two) times daily. 60 tablet 1  . simvastatin (ZOCOR) 20 MG tablet TAKE 1 TABLET (20 MG TOTAL) BY MOUTH AT BEDTIME. 90 tablet 2   No current facility-administered medications for this visit.    Allergies as of 10/06/2015  . (No Known Allergies)    ROS:  General: Negative for anorexia, weight loss, fever, chills, fatigue, weakness. ENT: Negative for hoarseness, difficulty swallowing , nasal congestion. CV: Negative for chest pain, angina, palpitations, dyspnea on exertion, peripheral edema.  Respiratory: Negative for dyspnea at rest, dyspnea on exertion, cough, sputum, wheezing.  GI: See history of present illness. GU:  Negative for dysuria, hematuria, urinary incontinence, urinary frequency, nocturnal urination.  Endo: Negative for unusual weight change.    Physical Examination:   BP 111/66 mmHg  Pulse 79  Temp(Src) 98.8 F (37.1 C) (Oral)  Ht  '5\' 9"'$  (1.753 m)  Wt 160 lb 3.2 oz (72.666 kg)  BMI 23.65 kg/m2  General: Well-nourished, well-developed in no acute distress.  Eyes: No icterus. Mouth: Oropharyngeal mucosa moist and pink , no lesions erythema or exudate. Lungs: Clear to auscultation bilaterally.  Heart: Regular rate and rhythm, no murmurs rubs or gallops.  Abdomen: Bowel sounds are normal, nontender, nondistended, no hepatosplenomegaly or masses, no abdominal bruits or hernia , no rebound or guarding.   Extremities: No lower extremity edema. No clubbing or deformities. Neuro: Alert and oriented x 4   Skin: Warm and dry, no jaundice.   Psych: Alert and cooperative, normal mood and affect.  Labs:  Lab Results  Component Value Date   CREATININE 1.03 09/28/2015   BUN 14 09/28/2015   NA 139 09/28/2015   K 3.3* 09/28/2015   CL 106 09/28/2015   CO2 25 09/28/2015   Lab Results  Component Value Date   WBC 5.8 09/28/2015   HGB 13.7 09/28/2015   HCT 40.4 09/28/2015   MCV 87.6 09/28/2015   PLT 217 09/28/2015    Imaging Studies: No results found.

## 2015-10-06 NOTE — Progress Notes (Signed)
cc'ed to pcp °

## 2015-10-20 ENCOUNTER — Encounter: Payer: Self-pay | Admitting: Family

## 2015-10-20 ENCOUNTER — Ambulatory Visit (INDEPENDENT_AMBULATORY_CARE_PROVIDER_SITE_OTHER): Payer: Medicare Other | Admitting: Family

## 2015-10-20 VITALS — BP 137/86 | HR 71 | Temp 97.3°F | Ht 69.0 in | Wt 162.0 lb

## 2015-10-20 DIAGNOSIS — I10 Essential (primary) hypertension: Secondary | ICD-10-CM | POA: Diagnosis not present

## 2015-10-20 DIAGNOSIS — K219 Gastro-esophageal reflux disease without esophagitis: Secondary | ICD-10-CM

## 2015-10-20 DIAGNOSIS — E559 Vitamin D deficiency, unspecified: Secondary | ICD-10-CM | POA: Diagnosis not present

## 2015-10-20 DIAGNOSIS — E785 Hyperlipidemia, unspecified: Secondary | ICD-10-CM

## 2015-10-20 DIAGNOSIS — B1920 Unspecified viral hepatitis C without hepatic coma: Secondary | ICD-10-CM | POA: Diagnosis not present

## 2015-10-20 MED ORDER — AMLODIPINE BESYLATE 10 MG PO TABS: 10.0000 mg | ORAL_TABLET | Freq: Every day | ORAL | 1 refills | Status: DC

## 2015-10-20 MED ORDER — SIMVASTATIN 20 MG PO TABS: ORAL_TABLET | ORAL | 2 refills | Status: DC

## 2015-10-20 MED ORDER — PANTOPRAZOLE SODIUM 20 MG PO TBEC: 20.0000 mg | DELAYED_RELEASE_TABLET | Freq: Two times a day (BID) | ORAL | 1 refills | Status: DC

## 2015-10-20 NOTE — Progress Notes (Signed)
Subjective:    Patient ID: Logan Price, male    DOB: September 15, 1945, 70 y.o.   MRN: [id number]  Pt presents to the office today for chronic follow up. PT is followed by GI for hepatitis C.  Pt has hemorrhoid banding surgery scheduled for August 24.   Hyperlipidemia  This is a chronic problem. The current episode started more than 1 year ago. The problem is controlled. Recent lipid tests were reviewed and are normal. He has no history of hypothyroidism. Factors aggravating his hyperlipidemia include fatty foods. Pertinent negatives include no chest pain, leg pain, myalgias or shortness of breath. Current antihyperlipidemic treatment includes statins. The current treatment provides moderate improvement of lipids. Risk factors for coronary artery disease include dyslipidemia, hypertension and male sex.  Gastroesophageal Reflux  He reports no chest pain, no coughing, no heartburn or no wheezing. This is a new problem. The current episode started 1 to 4 weeks ago. The problem occurs occasionally. The problem has been waxing and waning. The symptoms are aggravated by certain foods. He has tried a PPI for the symptoms. The treatment provided moderate relief.  Hypertension  This is a chronic problem. The current episode started more than 1 year ago. The problem has been resolved since onset. The problem is controlled. Pertinent negatives include no anxiety, chest pain, headaches, palpitations, peripheral edema or shortness of breath. Risk factors for coronary artery disease include dyslipidemia and male gender. Past treatments include calcium channel blockers. The current treatment provides significant improvement. There is no history of kidney disease, CAD/MI, heart failure or a thyroid problem. There is no history of sleep apnea.      Review of Systems  Constitutional: Negative.   HENT: Negative.   Respiratory: Negative.  Negative for cough, shortness of breath and wheezing.   Cardiovascular: Negative.   Negative for chest pain and palpitations.  Gastrointestinal: Negative.  Negative for heartburn.  Endocrine: Negative.   Genitourinary: Negative.   Musculoskeletal: Negative.  Negative for myalgias.  Neurological: Negative.  Negative for headaches.  Hematological: Negative.   Psychiatric/Behavioral: Negative.   All other systems reviewed and are negative.      Objective:   Physical Exam  Constitutional: He is oriented to person, place, and time. He appears well-developed and well-nourished. No distress.  HENT:  Head: Normocephalic.  Right Ear: External ear normal.  Left Ear: External ear normal.  Nose: Nose normal.  Mouth/Throat: Oropharynx is clear and moist.  Eyes: Pupils are equal, round, and reactive to light. Right eye exhibits no discharge. Left eye exhibits no discharge.  Neck: Normal range of motion. Neck supple. No thyromegaly present.  Cardiovascular: Normal rate, regular rhythm, normal heart sounds and intact distal pulses.   No murmur heard. Pulmonary/Chest: Effort normal and breath sounds normal. No respiratory distress. He has no wheezes.  Abdominal: Soft. Bowel sounds are normal. He exhibits no distension. There is no tenderness.  Musculoskeletal: Normal range of motion. He exhibits no edema or tenderness.  Neurological: He is alert and oriented to person, place, and time. He has normal reflexes. No cranial nerve deficit.  Skin: Skin is warm and dry. No rash noted. No erythema.  Psychiatric: He has a normal mood and affect. His behavior is normal. Judgment and thought content normal.  Vitals reviewed.    BP 137/86   Pulse 71   Temp 97.3 F (36.3 C) (Oral)   Ht 5\' 9"  (1.753 m)   Wt 162 lb (73.5 kg)   BMI 23.92 kg/m  Assessment & Plan:  1. Essential hypertension - amLODipine (NORVASC) 10 MG tablet; Take 1 tablet (10 mg total) by mouth daily.  Dispense: 90 tablet; Refill: 1 - CMP14+EGFR  2. Hepatitis C virus infection, unspecified chronicity -  CMP14+EGFR  3. HLD (hyperlipidemia) - simvastatin (ZOCOR) 20 MG tablet; TAKE 1 TABLET (20 MG TOTAL) BY MOUTH AT BEDTIME.  Dispense: 90 tablet; Refill: 2 - CMP14+EGFR - Lipid panel  4. Vitamin D deficiency - CMP14+EGFR - VITAMIN D 25 Hydroxy (Vit-D Deficiency, Fractures)  5. Gastroesophageal reflux disease, esophagitis presence not specified - pantoprazole (PROTONIX) 20 MG tablet; Take 1 tablet (20 mg total) by mouth 2 (two) times daily.  Dispense: 60 tablet; Refill: 1 - CMP14+EGFR   Continue all meds Labs pending Health Maintenance reviewed Diet and exercise encouraged RTO 6 months  [name], FNP

## 2015-10-20 NOTE — Patient Instructions (Signed)

## 2015-10-21 ENCOUNTER — Ambulatory Visit: Payer: Medicare Other | Admitting: Family

## 2015-10-21 LAB — LIPID PANEL
CHOL/HDL RATIO: 3.2 ratio (ref 0.0–5.0)
CHOLESTEROL TOTAL: 165 mg/dL (ref 100–199)
HDL: 52 mg/dL (ref >39)
LDL CALC: 39 mg/dL (ref 0–99)
Triglycerides: 369 mg/dL — ABNORMAL HIGH (ref 0–149)
VLDL CHOLESTEROL CAL: 74 mg/dL — AB (ref 5–40)

## 2015-10-21 LAB — CMP14+EGFR
ALK PHOS: 54 IU/L (ref 39–117)
ALT: 16 IU/L (ref 0–44)
AST: 17 IU/L (ref 0–40)
Albumin/Globulin Ratio: 1.5 (ref 1.2–2.2)
Albumin: 4.3 g/dL (ref 3.6–4.8)
BUN/Creatinine Ratio: 13 (ref 10–24)
BUN: 14 mg/dL (ref 8–27)
Bilirubin Total: 0.3 mg/dL (ref 0.0–1.2)
CO2: 23 mmol/L (ref 18–29)
CREATININE: 1.06 mg/dL (ref 0.76–1.27)
Calcium: 9.4 mg/dL (ref 8.6–10.2)
Chloride: 102 mmol/L (ref 96–106)
GFR calc Af Amer: 83 mL/min/{1.73_m2} (ref >59)
GFR calc non Af Amer: 72 mL/min/{1.73_m2} (ref >59)
GLOBULIN, TOTAL: 2.8 g/dL (ref 1.5–4.5)
GLUCOSE: 103 mg/dL — AB (ref 65–99)
Potassium: 3.8 mmol/L (ref 3.5–5.2)
SODIUM: 144 mmol/L (ref 134–144)
Total Protein: 7.1 g/dL (ref 6.0–8.5)

## 2015-10-21 LAB — VITAMIN D 25 HYDROXY (VIT D DEFICIENCY, FRACTURES): VIT D 25 HYDROXY: 40.7 ng/mL (ref 30.0–100.0)

## 2015-10-29 ENCOUNTER — Other Ambulatory Visit: Payer: Self-pay | Admitting: Family

## 2015-10-29 DIAGNOSIS — I10 Essential (primary) hypertension: Secondary | ICD-10-CM

## 2015-11-18 ENCOUNTER — Ambulatory Visit (INDEPENDENT_AMBULATORY_CARE_PROVIDER_SITE_OTHER): Payer: Medicare Other | Admitting: Gastroenterology

## 2015-11-18 ENCOUNTER — Other Ambulatory Visit: Payer: Self-pay

## 2015-11-18 ENCOUNTER — Encounter: Payer: Self-pay | Admitting: Gastroenterology

## 2015-11-18 DIAGNOSIS — K625 Hemorrhage of anus and rectum: Secondary | ICD-10-CM

## 2015-11-18 NOTE — Progress Notes (Addendum)
Subjective:    Patient ID: Logan Price, male    DOB: 1946-02-14, 70 y.o.   MRN: [id number]  [name], FNP  HPI HAVING bleeding Feb 2017. SEES BLOOD WHEN HE WIPES x1. LAST TIME JUL 2017. BMs: ONCE A DAY, NO STRAINING. NO RECTAL PRESSURE, PAIN, ITCHING, BURNING, OR SOILING. OCCASIONAL HEARTBURN/INDIGESTION: ONIONS.  PT DENIES FEVER, CHILLS, HEMATEMESIS, nausea, vomiting, melena, diarrhea, CHEST PAIN, SHORTNESS OF BREATH, CHANGE IN BOWEL IN HABITS, constipation, abdominal pain, problems swallowing, PROBLEMS WITH SEDATION FEB 2017, OR heartburn or indigestion.   Past Medical History:  Diagnosis Date  . Diverticulitis   . GERD (gastroesophageal reflux disease)   . Hepatitis C   . HTN (hypertension)   . Hyperlipidemia     Past Surgical History:  Procedure Laterality Date  . BIOPSY  05/04/2015   Procedure: BIOPSY;  Surgeon: West Bali, MD;  Location: [facility];  Service: Endoscopy;;  bx's of ileocecal valve   . COLONOSCOPY WITH PROPOFOL N/A 05/04/2015   Dr. Darrick Penna: normal appearing ileum with prominent IC valve with tubular adenomas, moderate diverticulosis in sigmoid colon, ascending colon, and retum. Moderate sized internal hemorrhoids. Surveillance in 5 years  . None to date     As of 04/14/15  . POLYPECTOMY  05/04/2015   Procedure: POLYPECTOMY;  Surgeon: West Bali, MD;  Location: [facility];  Service: Endoscopy;;  descending colon polyp, ascending colon polyp    No Known Allergies  Current Outpatient Prescriptions  Medication Sig Dispense Refill  . amLODipine (NORVASC) 10 MG tablet TAKE 1 TABLET DAILY    . cholecalciferol (VITAMIN D) 1000 UNITS tablet Take 2,000 Units by mouth daily.     . Multiple Vitamin (MULTIVITAMIN WITH MINERALS) TABS tablet Take 1 tablet by mouth daily.    . pantoprazole (PROTONIX) 20 MG tablet Take 1 tablet (20 mg total) by mouth 2 (two) times daily.    . simvastatin (ZOCOR) 20 MG tablet TAKE 1 TABLET (20 MG TOTAL) BY MOUTH AT BEDTIME.      Review of Systems PER HPI OTHERWISE ALL SYSTEMS ARE NEGATIVE.     Objective:   Physical Exam  Constitutional: He is oriented to person, place, and time. He appears well-developed and well-nourished. No distress.  HENT:  Head: Normocephalic and atraumatic.  Mouth/Throat: Oropharynx is clear and moist. No oropharyngeal exudate.  Eyes: Pupils are equal, round, and reactive to light. No scleral icterus.  Neck: Normal range of motion. Neck supple.  Cardiovascular: Normal rate, regular rhythm and normal heart sounds.   Pulmonary/Chest: Effort normal and breath sounds normal. No respiratory distress.  Abdominal: Soft. Bowel sounds are normal. He exhibits no distension. There is no tenderness.  Musculoskeletal: He exhibits no edema.  Lymphadenopathy:    He has no cervical adenopathy.  Neurological: He is alert and oriented to person, place, and time.  NO FOCAL DEFICITS  Psychiatric: He has a normal mood and affect.  Vitals reviewed.     Assessment & Plan:

## 2015-11-18 NOTE — Assessment & Plan Note (Signed)
SYMPTOMS NOT IDEALLY CONTROLLED & DUE TO HEMORRHOIDS. Last TCS FEB 2017.   FLEXIBLE SIGMOIDOSCOPY/POSSIBLE HEMORRHOID BANDING SEP 2017 -DISCUSSED PROCEDURE, BENEFITS, & RISKS: < 1% chance of medication reaction, PERFORATION, PELVIC VEIN SEPSIS, OR bleeding. USE PREPARATION H OR HCT CREAM FOUR TIMES  A DAY IF NEEDED TO RELIEVE RECTAL PAIN/PRESSURE/BLEEDING. FOLLOW UP IN 4 MOS.

## 2015-11-18 NOTE — Patient Instructions (Signed)
FLEXIBLE SIGMOIDOSCOPY/POSSIBLE HEMORRHOID BANDING SEP 2017. SEE INFO BELOW.  USE PREPARATION H OR HCT CREAM FOUR TIMES  A DAY IF NEEDED TO RELIEVE RECTAL PAIN/PRESSURE/BLEEDING.  FOLLOW UP IN 4 MOS.   HEMORRHOIDAL BANDING COMPLICATIONS:  COMMON: 1. MINOR PAIN  UNCOMMON: 1. ABSCESS 2. BAND FALLS OFF 3. PROLAPSE OF HEMORRHOIDS AND PAIN 4. ULCER BLEEDING  A. USUALLY SELF-LIMITED: MAY LAST 3-5 DAYS  B. MAY REQUIRE INTERVENTION: 1-2 WEEKS AFTER INTERACTIONS 5. NECROTIZING PELVIC SEPSIS; < 1%  A. SYMPTOMS: FEVER, PAIN, DIFFICULTY URINATING

## 2015-12-06 ENCOUNTER — Ambulatory Visit (INDEPENDENT_AMBULATORY_CARE_PROVIDER_SITE_OTHER): Payer: Medicare Other | Admitting: Internal Medicine

## 2015-12-06 ENCOUNTER — Encounter (INDEPENDENT_AMBULATORY_CARE_PROVIDER_SITE_OTHER): Payer: Self-pay | Admitting: Internal Medicine

## 2015-12-06 ENCOUNTER — Encounter (INDEPENDENT_AMBULATORY_CARE_PROVIDER_SITE_OTHER): Payer: Self-pay | Admitting: *Deleted

## 2015-12-06 VITALS — BP 100/60 | HR 64 | Temp 98.2°F | Ht 69.0 in | Wt 160.9 lb

## 2015-12-06 DIAGNOSIS — B1920 Unspecified viral hepatitis C without hepatic coma: Secondary | ICD-10-CM | POA: Diagnosis not present

## 2015-12-06 NOTE — Progress Notes (Signed)
Subjective:    Patient ID: Logan Price, male    DOB: [birth year], 70 y.o.   MRN: [id number]  HPI Here today for f/u. Marland Kitchen Hep C quaint undetected. Past Hx of IV drugs. No drugs in over 40 yrs. Has not drank etoh since January of 2017. Last seen in march of this year.  Has started the Hep A and B vaccine. Has one shot to go. His appetite is good. No weight loss.  Usually has BM daily. He drinks etoh two a night of vodka. No tattoos.   Hepatic Function Latest Ref Rng & Units 10/20/2015 04/22/2015 10/09/2014  Total Protein 6.0 - 8.5 g/dL 7.1 6.8 7.0  Albumin 3.6 - 4.8 g/dL 4.3 4.3 4.5  AST 0 - 40 IU/L 17 17 22   ALT 0 - 44 IU/L 16 15 15   Alk Phosphatase 39 - 117 IU/L 54 52 56  Total Bilirubin 0.0 - 1.2 mg/dL 0.3 0.4 0.4   CBC    Component Value Date/Time   WBC 5.8 09/28/2015 0930   RBC 4.61 09/28/2015 0930   HGB 13.7 09/28/2015 0930   HCT 40.4 09/28/2015 0930   PLT 217 09/28/2015 0930   MCV 87.6 09/28/2015 0930   MCV 90.8 11/03/2013 1409   MCH 29.7 09/28/2015 0930   MCHC 33.9 09/28/2015 0930   RDW 14.8 09/28/2015 0930   LYMPHSABS 1.6 09/28/2015 0930   MONOABS 0.5 09/28/2015 0930   EOSABS 0.1 09/28/2015 0930   BASOSABS 0.1 09/28/2015 0930        Review of Systems Past Medical History:  Diagnosis Date  . Diverticulitis   . GERD (gastroesophageal reflux disease)   . Hepatitis C   . HTN (hypertension)   . Hyperlipidemia     Past Surgical History:  Procedure Laterality Date  . BIOPSY  05/04/2015   Procedure: BIOPSY;  Surgeon: West Bali, MD;  Location: [facility];  Service: Endoscopy;;  bx's of ileocecal valve   . COLONOSCOPY WITH PROPOFOL N/A 05/04/2015   Dr. Darrick Penna: normal appearing ileum with prominent IC valve with tubular adenomas, moderate diverticulosis in sigmoid colon, ascending colon, and retum. Moderate sized internal hemorrhoids. Surveillance in 5 years  . None to date     As of 04/14/15  . POLYPECTOMY  05/04/2015   Procedure: POLYPECTOMY;  Surgeon: West Bali, MD;  Location: [facility];  Service: Endoscopy;;  descending colon polyp, ascending colon polyp    No Known Allergies  Current Outpatient Prescriptions on File Prior to Visit  Medication Sig Dispense Refill  . amLODipine (NORVASC) 10 MG tablet TAKE 1 TABLET DAILY 90 tablet 1  . cholecalciferol (VITAMIN D) 1000 UNITS tablet Take 1,000 Units by mouth daily.     . Multiple Vitamin (MULTIVITAMIN WITH MINERALS) TABS tablet Take 1 tablet by mouth daily.    . pantoprazole (PROTONIX) 20 MG tablet Take 1 tablet (20 mg total) by mouth 2 (two) times daily. 60 tablet 1  . simvastatin (ZOCOR) 20 MG tablet TAKE 1 TABLET (20 MG TOTAL) BY MOUTH AT BEDTIME. 90 tablet 2   No current facility-administered medications on file prior to visit.        Objective:   Physical Exam Blood pressure 100/60, pulse 64, temperature 98.2 F (36.8 C), height 5\' 9"  (1.753 m), weight 160 lb 14.4 oz (73 kg). Alert and oriented. Skin warm and dry. Oral mucosa is moist.   . Sclera anicteric, conjunctivae is pink. Thyroid not enlarged. No cervical lymphadenopathy. Lungs clear.  Heart regular rate and rhythm.  Abdomen is soft. Bowel sounds are positive. No hepatomegaly. No abdominal masses felt. No tenderness.  No edema to lower extremities.         Assessment & Plan:  Hep C quaint, US abdomen, Hepatic function. Ov in 1 year.

## 2015-12-06 NOTE — Patient Instructions (Signed)
OV in 1 year.  

## 2015-12-07 LAB — HEPATITIS C RNA QUANTITATIVE: HCV Quantitative: NOT DETECTED IU/mL (ref <15)

## 2015-12-07 LAB — HEPATIC FUNCTION PANEL
ALBUMIN: 4.4 g/dL (ref 3.6–5.1)
ALK PHOS: 43 U/L (ref 40–115)
ALT: 15 U/L (ref 9–46)
AST: 21 U/L (ref 10–35)
BILIRUBIN INDIRECT: 0.5 mg/dL (ref 0.2–1.2)
Bilirubin, Direct: 0.1 mg/dL (ref <=0.2)
TOTAL PROTEIN: 7.1 g/dL (ref 6.1–8.1)
Total Bilirubin: 0.6 mg/dL (ref 0.2–1.2)

## 2015-12-09 ENCOUNTER — Ambulatory Visit (HOSPITAL_COMMUNITY)
Admission: RE | Admit: 2015-12-09 | Discharge: 2015-12-09 | Disposition: A | Discharge status: Home / Self Care | Payer: Medicare Other | Source: Ambulatory Visit | Attending: Internal Medicine | Admitting: Internal Medicine

## 2015-12-09 DIAGNOSIS — N281 Cyst of kidney, acquired: Secondary | ICD-10-CM | POA: Insufficient documentation

## 2015-12-09 DIAGNOSIS — B1920 Unspecified viral hepatitis C without hepatic coma: Secondary | ICD-10-CM | POA: Diagnosis not present

## 2015-12-09 DIAGNOSIS — K760 Fatty (change of) liver, not elsewhere classified: Secondary | ICD-10-CM | POA: Insufficient documentation

## 2015-12-10 ENCOUNTER — Encounter (HOSPITAL_COMMUNITY): Payer: Self-pay | Admitting: *Deleted

## 2015-12-10 ENCOUNTER — Encounter (HOSPITAL_COMMUNITY)
Admission: RE | Disposition: A | Discharge status: Home / Self Care | Payer: Self-pay | Source: Ambulatory Visit | Attending: Gastroenterology

## 2015-12-10 ENCOUNTER — Ambulatory Visit (HOSPITAL_COMMUNITY)
Admission: RE | Admit: 2015-12-10 | Discharge: 2015-12-10 | Disposition: A | Discharge status: Home / Self Care | Payer: Medicare Other | Source: Ambulatory Visit | Attending: Gastroenterology | Admitting: Gastroenterology

## 2015-12-10 DIAGNOSIS — K219 Gastro-esophageal reflux disease without esophagitis: Secondary | ICD-10-CM | POA: Insufficient documentation

## 2015-12-10 DIAGNOSIS — Z79899 Other long term (current) drug therapy: Secondary | ICD-10-CM | POA: Diagnosis not present

## 2015-12-10 DIAGNOSIS — K625 Hemorrhage of anus and rectum: Secondary | ICD-10-CM | POA: Diagnosis present

## 2015-12-10 DIAGNOSIS — K642 Third degree hemorrhoids: Secondary | ICD-10-CM

## 2015-12-10 DIAGNOSIS — K921 Melena: Secondary | ICD-10-CM | POA: Diagnosis not present

## 2015-12-10 DIAGNOSIS — I10 Essential (primary) hypertension: Secondary | ICD-10-CM | POA: Insufficient documentation

## 2015-12-10 DIAGNOSIS — E785 Hyperlipidemia, unspecified: Secondary | ICD-10-CM | POA: Diagnosis not present

## 2015-12-10 HISTORY — PX: HEMORRHOID BANDING: SHX5850

## 2015-12-10 HISTORY — PX: FLEXIBLE SIGMOIDOSCOPY: SHX5431

## 2015-12-10 SURGERY — SIGMOIDOSCOPY, FLEXIBLE
Anesthesia: Moderate Sedation

## 2015-12-10 MED ORDER — MIDAZOLAM HCL 5 MG/5ML IJ SOLN
INTRAMUSCULAR | Status: AC
  Filled 2015-12-10: qty 10

## 2015-12-10 MED ORDER — SODIUM CHLORIDE 0.9 % IV SOLN
INTRAVENOUS | Status: DC
  Administered 2015-12-10: 1000 mL via INTRAVENOUS

## 2015-12-10 MED ORDER — MIDAZOLAM HCL 5 MG/5ML IJ SOLN
INTRAMUSCULAR | Status: DC | PRN
  Administered 2015-12-10: 1 mg via INTRAVENOUS
  Administered 2015-12-10: 2 mg via INTRAVENOUS

## 2015-12-10 MED ORDER — MEPERIDINE HCL 100 MG/ML IJ SOLN
INTRAMUSCULAR | Status: DC | PRN
  Administered 2015-12-10 (×2): 25 mg via INTRAVENOUS

## 2015-12-10 MED ORDER — MEPERIDINE HCL 100 MG/ML IJ SOLN
INTRAMUSCULAR | Status: AC
  Filled 2015-12-10: qty 2

## 2015-12-10 NOTE — Discharge Instructions (Signed)
HEMORRHOIDAL BANDING DISCHARGE INSTRUCTIONS  I PUT BANDS IN 3 AREAS ABOVE YOUR HEMORRHOIDS. CALL [phone] FOR FOR RECTAL BLEEDING, FEVER, PAIN, OR DIFFICULTY URINATING. YOU MAY SEE BLEEDING FOR 3 DAYS TO 2 WEEKS.    HOME CARE INSTRUCTIONS  FOLLOWING YOUR PROCEDURE IT IS COMMON TO HAVE MINOR RECTAL DISCOMFORT OR PAIN. 1.  YOU may use ibuprofen 200 MG over-the-counter 2 every 6 hours IF needed for mild rectal pain OR TYLENOL.   Take the ibuprofen with food and milk.  2   Use sitz bath 3 times a day  IF NEEDED FOR RECTAL PAIN.  3. Avoid straining to have bowel movements. 4. Keep anal area dry and clean.  5. Do not use a donut shaped pillow or sit on the toilet for long periods. This increases blood pooling and pain.  6. Move your bowels when your body has the urge; this will require less straining and will decrease pain and pressure.  7. IF NEEDED USE Colace 100 mg twice daily FOR 7 DAYS to soften the stool. HOLD FOR DIARRHEA.        SIGMOIDOSCOPY Care After Read the instructions outlined below and refer to this sheet in the next week. These discharge instructions provide you with general information on caring for yourself after you leave the hospital. While your treatment has been planned according to the most current medical practices available, unavoidable complications occasionally occur. If you have any problems or questions after discharge, call DR. Carlei Huang, [phone].  ACTIVITY  You may resume your regular activity, but move at a slower pace for the next 24 hours.   Take frequent rest periods for the next 24 hours.   Walking will help get rid of the air and reduce the bloated feeling in your belly (abdomen).   No driving for 24 hours (because of the medicine (anesthesia) used during the test).   You may shower.   Do not sign any important legal documents or operate any machinery for 24 hours (because of the anesthesia used during the test).    NUTRITION  Drink  plenty of fluids.   You may resume your normal diet as instructed by your doctor.   Begin with a light meal and progress to your normal diet. Heavy or fried foods are harder to digest and may make you feel sick to your stomach (nauseated).   Avoid alcoholic beverages for 24 hours or as instructed.    MEDICATIONS  You may resume your normal medications.   WHAT YOU CAN EXPECT TODAY  Some feelings of bloating in the abdomen.   Passage of more gas than usual.   Spotting of blood in your stool or on the toilet paper  .  IF YOU HAD POLYPS REMOVED DURING THE COLONOSCOPY:  Eat a soft diet IF YOU HAVE NAUSEA, BLOATING, ABDOMINAL PAIN, OR VOMITING.    FINDING OUT THE RESULTS OF YOUR TEST Not all test results are available during your visit. DR. Oneida Alar WILL CALL YOU WITHIN 7 DAYS OF YOUR PROCEDUE WITH YOUR RESULTS. Do not assume everything is normal if you have not heard from DR. Kandace Elrod IN ONE WEEK, CALL HER OFFICE AT [phone].  SEEK IMMEDIATE MEDICAL ATTENTION AND CALL THE OFFICE: [phone] IF:  You have more than a spotting of blood in your stool.   Your belly is swollen (abdominal distention).   You are nauseated or vomiting.   You have a temperature over 101F.   You have abdominal pain or discomfort that is severe or gets  worse throughout the day.    HEMORRHOIDAL BANDING COMPLICATIONS:  COMMON: 1. MINOR PAIN  UNCOMMON: 1. ABSCESS 2. BAND FALLS OFF 3. PROLAPSE OF HEMORRHOIDS AND PAIN 4. ULCER BLEEDING  A. USUALLY SELF-LIMITED: MAY LAST 3-5 DAYS  B. MAY REQUIRE INTERVENTION: 1-2 WEEKS AFTER INTERACTIONS 5. NECROTIZING PELVIC SEPSIS  A. SYMPTOMS: FEVER, PAIN, DIFFICULTY URINATING

## 2015-12-10 NOTE — Op Note (Signed)
[facility] Patient Name: Logan Price Procedure Date: 12/10/2015 2:14 PM MRN: [id number] Date of Birth: 1946-03-01 Attending MD: Jonette Eva , MD CSN: [id number] Age: 70 Admit Type: Outpatient Procedure:                Flexible Sigmoidoscopy WITH HEMORRHOID BANDING Indications:              Hematochezia Providers:                Jonette Eva, MD, Jannett Celestine, RN, Birder Robson,                            Technician Referring MD:              Medicines:                Meperidine 50 mg IV, Midazolam 3 mg IV Complications:            No immediate complications. Estimated Blood Loss:     Estimated blood loss was minimal. Procedure:                Pre-Anesthesia Assessment:                           - Prior to the procedure, a History and Physical                            was performed, and patient medications and                            allergies were reviewed. The patient's tolerance of                            previous anesthesia was also reviewed. The risks                            and benefits of the procedure and the sedation                            options and risks were discussed with the patient.                            All questions were answered, and informed consent                            was obtained. Prior Anticoagulants: The patient has                            taken no previous anticoagulant or antiplatelet                            agents. ASA Grade Assessment: II - A patient with                            mild systemic disease. After reviewing the risks  and benefits, the patient was deemed in                            satisfactory condition to undergo the procedure.                            After obtaining informed consent, the scope was                            passed under direct vision. The [id number] ([id number])                            was introduced through the anus and advanced to the         the descending colon. The flexible sigmoidoscopy                            was accomplished without difficulty. The patient                            tolerated the procedure well. The quality of the                            bowel preparation was excellent. Scope In: 2:48:47 PM Scope Out: 3:00:00 PM Total Procedure Duration: 0 hours 11 minutes 13 seconds  Findings:      The digital rectal exam findings include non-thrombosed internal       hemorrhoids and internal hemorrhoids that prolapse with straining, but       require manual replacement into the anal canal (Grade III).      Non-bleeding internal hemorrhoids were found. The hemorrhoids were Grade       III (internal hemorrhoids that prolapse but require manual reduction).       Three bands were successfully placed. A slow ooze remained at the end of       the procedure. Impression:               -RECTAL BLEEDING DUE TO Non-thrombosed internal                            hemorrhoids WITH prolapse Moderate Sedation:      Moderate (conscious) sedation was administered by the endoscopy nurse       and supervised by the endoscopist. The following parameters were       monitored: oxygen saturation, heart rate, blood pressure, and response       to care. Total physician intraservice time was 18 minutes. Recommendation:           - Continue present medications.                           - Resume previous diet.                           - Return to my office in JAN 2017.                           - Patient has a contact number available for  emergencies. The signs and symptoms of potential                            delayed complications were discussed with the                            patient. Return to normal activities tomorrow.                            Written discharge instructions were provided to the                            patient. Procedure Code(s):        --- Professional ---                            [phone], Sigmoidoscopy, flexible; with band                            ligation(s) (eg, hemorrhoids)                           99152, Moderate sedation services provided by the                            same physician or other qualified health care                            professional performing the diagnostic or                            therapeutic service that the sedation supports,                            requiring the presence of an independent trained                            observer to assist in the monitoring of the                            patient's level of consciousness and physiological                            status; initial 15 minutes of intraservice time,                            patient age 44 years or older Diagnosis Code(s):        --- Professional ---                           K64.2, Third degree hemorrhoids                           K92.1, Melena (includes Hematochezia) CPT copyright 2016 [employer]. All rights reserved. The codes documented in this report are preliminary and  upon coder review may  be revised to meet current compliance requirements. Jonette Eva, MD Jonette Eva, MD 12/10/2015 3:26:25 PM This report has been signed electronically. Number of Addenda: 0

## 2015-12-10 NOTE — Interval H&P Note (Signed)
History and Physical Interval Note:  12/10/2015 2:20 PM  Logan Price  has presented today for surgery, with the diagnosis of Rectal bleeding  The various methods of treatment have been discussed with the patient and family. After consideration of risks, benefits and other options for treatment, the patient has consented to  Procedure(s) with comments: FLEXIBLE SIGMOIDOSCOPY (N/A) - 1:30 PM HEMORRHOID BANDING (N/A) - 1:30 PM as a surgical intervention .  The patient's history has been reviewed, patient examined, no change in status, stable for surgery.  I have reviewed the patient's chart and labs.  Questions were answered to the patient's satisfaction.     [employer]

## 2015-12-10 NOTE — H&P (View-Only) (Signed)
Subjective:    Patient ID: Logan Price, male    DOB: 1946-02-14, 70 y.o.   MRN: [id number]  [name], FNP  HPI HAVING bleeding Feb 2017. SEES BLOOD WHEN HE WIPES x1. LAST TIME JUL 2017. BMs: ONCE A DAY, NO STRAINING. NO RECTAL PRESSURE, PAIN, ITCHING, BURNING, OR SOILING. OCCASIONAL HEARTBURN/INDIGESTION: ONIONS.  PT DENIES FEVER, CHILLS, HEMATEMESIS, nausea, vomiting, melena, diarrhea, CHEST PAIN, SHORTNESS OF BREATH, CHANGE IN BOWEL IN HABITS, constipation, abdominal pain, problems swallowing, PROBLEMS WITH SEDATION FEB 2017, OR heartburn or indigestion.   Past Medical History:  Diagnosis Date  . Diverticulitis   . GERD (gastroesophageal reflux disease)   . Hepatitis C   . HTN (hypertension)   . Hyperlipidemia     Past Surgical History:  Procedure Laterality Date  . BIOPSY  05/04/2015   Procedure: BIOPSY;  Surgeon: West Bali, MD;  Location: [facility];  Service: Endoscopy;;  bx's of ileocecal valve   . COLONOSCOPY WITH PROPOFOL N/A 05/04/2015   Dr. Darrick Penna: normal appearing ileum with prominent IC valve with tubular adenomas, moderate diverticulosis in sigmoid colon, ascending colon, and retum. Moderate sized internal hemorrhoids. Surveillance in 5 years  . None to date     As of 04/14/15  . POLYPECTOMY  05/04/2015   Procedure: POLYPECTOMY;  Surgeon: West Bali, MD;  Location: [facility];  Service: Endoscopy;;  descending colon polyp, ascending colon polyp    No Known Allergies  Current Outpatient Prescriptions  Medication Sig Dispense Refill  . amLODipine (NORVASC) 10 MG tablet TAKE 1 TABLET DAILY    . cholecalciferol (VITAMIN D) 1000 UNITS tablet Take 2,000 Units by mouth daily.     . Multiple Vitamin (MULTIVITAMIN WITH MINERALS) TABS tablet Take 1 tablet by mouth daily.    . pantoprazole (PROTONIX) 20 MG tablet Take 1 tablet (20 mg total) by mouth 2 (two) times daily.    . simvastatin (ZOCOR) 20 MG tablet TAKE 1 TABLET (20 MG TOTAL) BY MOUTH AT BEDTIME.      Review of Systems PER HPI OTHERWISE ALL SYSTEMS ARE NEGATIVE.     Objective:   Physical Exam  Constitutional: He is oriented to person, place, and time. He appears well-developed and well-nourished. No distress.  HENT:  Head: Normocephalic and atraumatic.  Mouth/Throat: Oropharynx is clear and moist. No oropharyngeal exudate.  Eyes: Pupils are equal, round, and reactive to light. No scleral icterus.  Neck: Normal range of motion. Neck supple.  Cardiovascular: Normal rate, regular rhythm and normal heart sounds.   Pulmonary/Chest: Effort normal and breath sounds normal. No respiratory distress.  Abdominal: Soft. Bowel sounds are normal. He exhibits no distension. There is no tenderness.  Musculoskeletal: He exhibits no edema.  Lymphadenopathy:    He has no cervical adenopathy.  Neurological: He is alert and oriented to person, place, and time.  NO FOCAL DEFICITS  Psychiatric: He has a normal mood and affect.  Vitals reviewed.     Assessment & Plan:

## 2015-12-11 ENCOUNTER — Other Ambulatory Visit: Payer: Self-pay | Admitting: Family

## 2015-12-11 DIAGNOSIS — K219 Gastro-esophageal reflux disease without esophagitis: Secondary | ICD-10-CM

## 2015-12-20 ENCOUNTER — Encounter (HOSPITAL_COMMUNITY): Payer: Self-pay | Admitting: Gastroenterology

## 2016-01-04 ENCOUNTER — Ambulatory Visit (INDEPENDENT_AMBULATORY_CARE_PROVIDER_SITE_OTHER): Payer: Medicare Other | Admitting: *Deleted

## 2016-01-04 DIAGNOSIS — Z23 Encounter for immunization: Secondary | ICD-10-CM

## 2016-01-04 NOTE — Progress Notes (Signed)
Pt given 3rd Hep A/B vaccine Tolerated well

## 2016-01-11 ENCOUNTER — Other Ambulatory Visit: Payer: Self-pay

## 2016-01-11 DIAGNOSIS — K219 Gastro-esophageal reflux disease without esophagitis: Secondary | ICD-10-CM

## 2016-01-11 MED ORDER — PANTOPRAZOLE SODIUM 20 MG PO TBEC: 20.0000 mg | DELAYED_RELEASE_TABLET | Freq: Two times a day (BID) | ORAL | 0 refills | Status: DC

## 2016-01-17 ENCOUNTER — Encounter: Payer: Self-pay | Admitting: Gastroenterology

## 2016-01-17 ENCOUNTER — Ambulatory Visit (INDEPENDENT_AMBULATORY_CARE_PROVIDER_SITE_OTHER): Payer: Medicare Other | Admitting: Gastroenterology

## 2016-01-17 VITALS — BP 126/81 | HR 82 | Temp 98.1°F | Ht 69.0 in | Wt 157.6 lb

## 2016-01-17 DIAGNOSIS — K625 Hemorrhage of anus and rectum: Secondary | ICD-10-CM | POA: Diagnosis not present

## 2016-01-17 NOTE — Progress Notes (Signed)
Primary Care Physician:  Evelina Dun, FNP  Primary GI: Dr. Oneida Alar   Chief Complaint  Patient presents with  . Follow-up    doing ok    HPI:   Logan Price is a 70 y.o. male presenting today with a history of rectal bleeding, s/p recent flex sig with banding X 3. He returns today in routine follow-up. Doing well.   No abdominal pain, no N/V. No reflux issues. No rectal bleeding. No constipation or diarrhea. No straining.    Past Medical History:  Diagnosis Date  . Diverticulitis   . GERD (gastroesophageal reflux disease)   . Hepatitis C   . HTN (hypertension)   . Hyperlipidemia     Past Surgical History:  Procedure Laterality Date  . BIOPSY  05/04/2015   Procedure: BIOPSY;  Surgeon: Danie Binder, MD;  Location: [facility];  Service: Endoscopy;;  bx's of ileocecal valve   . COLONOSCOPY WITH PROPOFOL N/A 05/04/2015   Dr. Oneida Alar: normal appearing ileum with prominent IC valve with tubular adenomas, moderate diverticulosis in sigmoid colon, ascending colon, and retum. Moderate sized internal hemorrhoids. Surveillance in 5 years  . FLEXIBLE SIGMOIDOSCOPY N/A 12/10/2015   Procedure: FLEXIBLE SIGMOIDOSCOPY;  Surgeon: Danie Binder, MD;  Location: [facility];  Service: Endoscopy;  Laterality: N/A;  1:30 PM  . HEMORRHOID BANDING N/A 12/10/2015   Procedure: HEMORRHOID BANDING;  Surgeon: Danie Binder, MD;  Location: [facility];  Service: Endoscopy;  Laterality: N/A;  1:30 PM  . None to date     As of 04/14/15  . POLYPECTOMY  05/04/2015   Procedure: POLYPECTOMY;  Surgeon: Danie Binder, MD;  [facility]: [facility];  Service: Endoscopy;;  descending colon polyp, ascending colon polyp    Current Outpatient Prescriptions  Medication Sig Dispense Refill  . amLODipine (NORVASC) 10 MG tablet TAKE 1 TABLET DAILY 90 tablet 1  . cholecalciferol (VITAMIN D) 1000 UNITS tablet Take 2,000 Units by mouth daily.     . Multiple Vitamin (MULTIVITAMIN WITH MINERALS) TABS tablet Take 1  tablet by mouth daily.    . pantoprazole (PROTONIX) 20 MG tablet Take 1 tablet (20 mg total) by mouth 2 (two) times daily. 180 tablet 0  . simvastatin (ZOCOR) 20 MG tablet TAKE 1 TABLET (20 MG TOTAL) BY MOUTH AT BEDTIME. 90 tablet 2   No current facility-administered medications for this visit.     Allergies as of 01/17/2016  . (No Known Allergies)    Family History  Problem Relation Age of Onset  . Cancer Mother   . Hypertension Father   . Hypertension Sister   . Hypertension Brother   . Hypertension Sister   . Stroke Brother     hemorrhagic  . Colon cancer Neg Hx     Social History   Social History  . Marital status: Married    Spouse name: N/A  . Number of children: N/A  . Years of education: N/A   Social History Main Topics  . Smoking status: Former Smoker    Quit date: 10/09/2006  . Smokeless tobacco: Never Used     Comment: quit x 9 years  . Alcohol use 0.0 oz/week     Comment: 7 oz of vodka per night  . Drug use: No  . Sexual activity: Not Asked   Other Topics Concern  . None   Social History Narrative  . None    Review of Systems: Negative unless mentioned in HPI   Physical  Exam: BP 126/81   Pulse 82   Temp 98.1 F (36.7 C) (Oral)   Ht '5\' 9"'$  (1.753 m)   Wt 157 lb 9.6 oz (71.5 kg)   BMI 23.27 kg/m  General:   Alert and oriented. No distress noted. Pleasant and cooperative.  Head:  Normocephalic and atraumatic. Eyes:  Conjuctiva clear without scleral icterus. Abdomen:  +BS, soft, non-tender and non-distended. No rebound or guarding. No HSM or masses noted. Msk:  Symmetrical without gross deformities. Normal posture. Extremities:  Without edema. Neurologic:  Alert and  oriented x4 Psych:  Alert and cooperative. Normal mood and affect.

## 2016-01-17 NOTE — Progress Notes (Signed)
cc'd to pcp 

## 2016-01-17 NOTE — Assessment & Plan Note (Signed)
Secondary to internal hemorrhoids, now resolved s/p flex sig with banding. Doing well. Discussed avoidance of constipation, straining, and other behavior modification. Return in 1 year or sooner if needed.

## 2016-01-17 NOTE — Patient Instructions (Signed)
We will see you back in 1 year or sooner if needed!  Please let us know if you have any problems in the meantime!

## 2016-03-07 ENCOUNTER — Encounter: Payer: Self-pay | Admitting: Gastroenterology

## 2016-04-13 ENCOUNTER — Ambulatory Visit: Payer: Medicare Other | Admitting: Gastroenterology

## 2016-04-20 ENCOUNTER — Ambulatory Visit (INDEPENDENT_AMBULATORY_CARE_PROVIDER_SITE_OTHER): Payer: Medicare Other | Admitting: Gastroenterology

## 2016-04-20 ENCOUNTER — Encounter: Payer: Self-pay | Admitting: Gastroenterology

## 2016-04-20 DIAGNOSIS — K648 Other hemorrhoids: Secondary | ICD-10-CM

## 2016-04-20 DIAGNOSIS — Z860101 Personal history of adenomatous and serrated colon polyps: Secondary | ICD-10-CM

## 2016-04-20 DIAGNOSIS — Z86010 Personal history of colonic polyps: Secondary | ICD-10-CM

## 2016-04-20 DIAGNOSIS — B182 Chronic viral hepatitis C: Secondary | ICD-10-CM

## 2016-04-20 DIAGNOSIS — K219 Gastro-esophageal reflux disease without esophagitis: Secondary | ICD-10-CM | POA: Diagnosis not present

## 2016-04-20 NOTE — Assessment & Plan Note (Signed)
REVIEWED LABS/IMAGING FROM 2016 TO PRESENT. RECENT US 2017 NL LIVER, AND PLT CT NL,HFP NL.  INFECTION RESOLVED. NO ADDITIONAL WORKUP NEEDED AT THIS TIME.

## 2016-04-20 NOTE — Assessment & Plan Note (Signed)
SYMPTOMS CONTROLLED/RESOLVED AFTER FLEX SIF BANDSx 3.  CONTINUE TO MONITOR SYMPTOMS. DRINK WATER EAT FIBER AVOID CONSTIPATION OUTPATIENT VISIT IF NEEDED.

## 2016-04-20 NOTE — Patient Instructions (Signed)
DRINK WATER TO KEEP YOUR URINE LIGHT YELLOW.  FOLLOW A HIGH FIBER DIET. AVOID ITEMS THAT CAUSE BLOATING & GAS. SEE INFO BELOW.  CONTINUE PROTONIX. TAKE 30 MINUTES PRIOR TO MEALS ONCE OR TWICE DAILY.  PLEASE CALL WITH QUESTIONS OR CONCERNS.  YOUR NEXT COLONOSCOPY WILL BE IN FEB 2022.  High-Fiber Diet A high-fiber diet changes your normal diet to include more whole grains, legumes, fruits, and vegetables. Changes in the diet involve replacing refined carbohydrates with unrefined foods. The calorie level of the diet is essentially unchanged. The Dietary Reference Intake (recommended amount) for adult males is 38 grams per day. For adult females, it is 25 grams per day. Pregnant and lactating women should consume 28 grams of fiber per day.Fiber is the intact part of a plant that is not broken down during digestion. Functional fiber is fiber that has been isolated from the plant to provide a beneficial effect in the body.  PURPOSE  Increase stool bulk.   Ease and regulate bowel movements.   Lower cholesterol.   REDUCE RISK OF COLON CANCER  INDICATIONS THAT YOU NEED MORE FIBER  Constipation and hemorrhoids.   Uncomplicated diverticulosis (intestine condition) and irritable bowel syndrome.   Weight management.   As a protective measure against hardening of the arteries (atherosclerosis), diabetes, and cancer.   GUIDELINES FOR INCREASING FIBER IN THE DIET  Start adding fiber to the diet slowly. A gradual increase of about 5 more grams (2 slices of whole-wheat bread, 2 servings of most fruits or vegetables, or 1 bowl of high-fiber cereal) per day is best. Too rapid an increase in fiber may result in constipation, flatulence, and bloating.   Drink enough water and fluids to keep your urine clear or pale yellow. Water, juice, or caffeine-free drinks are recommended. Not drinking enough fluid may cause constipation.   Eat a variety of high-fiber foods rather than one type of fiber.   Try  to increase your intake of fiber through using high-fiber foods rather than fiber pills or supplements that contain small amounts of fiber.   The goal is to change the types of food eaten. Do not supplement your present diet with high-fiber foods, but replace foods in your present diet.   INCLUDE A VARIETY OF FIBER SOURCES  Replace refined and processed grains with whole grains, canned fruits with fresh fruits, and incorporate other fiber sources. White rice, white breads, and most bakery goods contain little or no fiber.   Brown whole-grain rice, buckwheat oats, and many fruits and vegetables are all good sources of fiber. These include: broccoli, Brussels sprouts, cabbage, cauliflower, beets, sweet potatoes, white potatoes (skin on), carrots, tomatoes, eggplant, squash, berries, fresh fruits, and dried fruits.   Cereals appear to be the richest source of fiber. Cereal fiber is found in whole grains and bran. Bran is the fiber-rich outer coat of cereal grain, which is largely removed in refining. In whole-grain cereals, the bran remains. In breakfast cereals, the largest amount of fiber is found in those with "bran" in their names. The fiber content is sometimes indicated on the label.   You may need to include additional fruits and vegetables each day.   In baking, for 1 cup white flour, you may use the following substitutions:   1 cup whole-wheat flour minus 2 tablespoons.   1/2 cup white flour plus 1/2 cup whole-wheat flour.

## 2016-04-20 NOTE — Progress Notes (Signed)
CC'D TO PCP °

## 2016-04-20 NOTE — Assessment & Plan Note (Signed)
SYMPTOMS CONTROLLED/RESOLVED ON PROTONIX.  CONTINUE TO MONITOR SYMPTOMS.

## 2016-04-20 NOTE — Progress Notes (Signed)
Subjective:    Patient ID: Logan Price, male    DOB: Nov 15, 1945, 71 y.o.   MRN: [id number]  [name], FNP  HPI No questions or concerns. BOWELS ARE GOOD. APPETITE IS GOOD.  PT DENIES FEVER, CHILLS, HEMATOCHEZIA, HEMATEMESIS, nausea, vomiting, melena, diarrhea, CHEST PAIN, SHORTNESS OF BREATH,  CHANGE IN BOWEL IN HABITS, constipation, abdominal pain, problems swallowing, OR heartburn or indigestion.  Past Medical History:  Diagnosis Date  . Diverticulitis   . GERD (gastroesophageal reflux disease)   . Hepatitis C   . HTN (hypertension)   . Hyperlipidemia    Past Surgical History:  Procedure Laterality Date  . BIOPSY  05/04/2015   Procedure: BIOPSY;  Surgeon: West Bali, MD;  Location: [facility];  Service: Endoscopy;;  bx's of ileocecal valve   . COLONOSCOPY WITH PROPOFOL N/A 05/04/2015   Dr. Darrick Penna: normal appearing ileum with prominent IC valve with tubular adenomas, moderate diverticulosis in sigmoid colon, ascending colon, and retum. Moderate sized internal hemorrhoids. Surveillance in 5 years  . FLEXIBLE SIGMOIDOSCOPY N/A 12/10/2015   hemorrhoid banding X 3   . HEMORRHOID BANDING N/A 12/10/2015   Procedure: HEMORRHOID BANDING;  Surgeon: West Bali, MD;  Location: [facility];  Service: Endoscopy;  Laterality: N/A;  1:30 PM  . None to date     As of 04/14/15  . POLYPECTOMY  05/04/2015   Procedure: POLYPECTOMY;  Surgeon: West Bali, MD;  Location: [facility];  Service: Endoscopy;;  descending colon polyp, ascending colon polyp    No Known Allergies  Current Outpatient Prescriptions  Medication Sig Dispense Refill  . amLODipine (NORVASC) 10 MG tablet TAKE 1 TABLET DAILY    . cholecalciferol (VITAMIN D) 1000 UNITS tablet Take 2,000 Units by mouth daily.     . Multiple Vitamin (MULTIVITAMIN WITH MINERALS) TABS tablet Take 1 tablet by mouth daily.    . pantoprazole (PROTONIX) 20 MG tablet Take 1 tablet (20 mg total) by mouth 2 (two) times daily.    .  simvastatin (ZOCOR) 20 MG tablet TAKE 1 TABLET (20 MG TOTAL) BY MOUTH AT BEDTIME.     Review of Systems PER HPI OTHERWISE ALL SYSTEMS ARE NEGATIVE.    Objective:   Physical Exam  Constitutional: He is oriented to person, place, and time. He appears well-developed and well-nourished. No distress.  HENT:  Head: Normocephalic and atraumatic.  Mouth/Throat: Oropharynx is clear and moist. No oropharyngeal exudate.  Eyes: Pupils are equal, round, and reactive to light. No scleral icterus.  Neck: Normal range of motion. Neck supple.  Cardiovascular: Normal rate, regular rhythm and normal heart sounds.   Pulmonary/Chest: Effort normal and breath sounds normal. No respiratory distress.  Abdominal: Soft. Bowel sounds are normal. He exhibits no distension. There is no tenderness.  Musculoskeletal: He exhibits no edema.  Lymphadenopathy:    He has no cervical adenopathy.  Neurological: He is alert and oriented to person, place, and time.  Psychiatric: He has a normal mood and affect.  Vitals reviewed.         Assessment & Plan:

## 2016-04-20 NOTE — Assessment & Plan Note (Signed)
LAST TCS FEB 2017. NO WARNING SIGNS/SYMPTOMS  NEXT TCS IN FEB 2022.

## 2016-04-21 ENCOUNTER — Ambulatory Visit (INDEPENDENT_AMBULATORY_CARE_PROVIDER_SITE_OTHER): Payer: Medicare Other | Admitting: Family

## 2016-04-21 ENCOUNTER — Encounter: Payer: Self-pay | Admitting: Family

## 2016-04-21 VITALS — BP 115/74 | HR 76 | Temp 97.4°F | Ht 69.0 in | Wt 158.2 lb

## 2016-04-21 DIAGNOSIS — I10 Essential (primary) hypertension: Secondary | ICD-10-CM | POA: Diagnosis not present

## 2016-04-21 DIAGNOSIS — E785 Hyperlipidemia, unspecified: Secondary | ICD-10-CM

## 2016-04-21 DIAGNOSIS — J309 Allergic rhinitis, unspecified: Secondary | ICD-10-CM

## 2016-04-21 DIAGNOSIS — K219 Gastro-esophageal reflux disease without esophagitis: Secondary | ICD-10-CM | POA: Diagnosis not present

## 2016-04-21 DIAGNOSIS — E559 Vitamin D deficiency, unspecified: Secondary | ICD-10-CM | POA: Diagnosis not present

## 2016-04-21 MED ORDER — AMLODIPINE BESYLATE 10 MG PO TABS: 10.0000 mg | ORAL_TABLET | Freq: Every day | ORAL | 1 refills | Status: DC

## 2016-04-21 MED ORDER — FLUTICASONE PROPIONATE 50 MCG/ACT NA SUSP: 2.0000 | Freq: Every day | NASAL | 6 refills | Status: DC

## 2016-04-21 MED ORDER — SIMVASTATIN 20 MG PO TABS: ORAL_TABLET | ORAL | 2 refills | Status: DC

## 2016-04-21 NOTE — Progress Notes (Signed)
Subjective:    Patient ID: Logan Price, male    DOB: 04/17/1945, 71 y.o.   MRN: [id number]  Pt presents to the office today for chronic follow up.   Hypertension  This is a chronic problem. The current episode started more than 1 year ago. The problem has been resolved since onset. The problem is controlled. Pertinent negatives include no anxiety, chest pain, headaches, palpitations, peripheral edema or shortness of breath. Risk factors for coronary artery disease include dyslipidemia and male gender. Past treatments include calcium channel blockers. The current treatment provides significant improvement. There is no history of kidney disease, CAD/MI or heart failure. There is no history of sleep apnea or a thyroid problem.  Hyperlipidemia  This is a chronic problem. The current episode started more than 1 year ago. The problem is controlled. Recent lipid tests were reviewed and are normal. He has no history of hypothyroidism. Factors aggravating his hyperlipidemia include fatty foods. Pertinent negatives include no chest pain, leg pain, myalgias or shortness of breath. Current antihyperlipidemic treatment includes statins. The current treatment provides moderate improvement of lipids. Risk factors for coronary artery disease include dyslipidemia, hypertension and male sex.  Gastroesophageal Reflux  He reports no chest pain, no coughing, no heartburn or no wheezing. This is a chronic problem. The current episode started 1 to 4 weeks ago. The problem occurs occasionally. The problem has been waxing and waning. The symptoms are aggravated by certain foods. He has tried a PPI for the symptoms. The treatment provided moderate relief.      Review of Systems  Constitutional: Negative.   HENT: Negative.   Respiratory: Negative.  Negative for cough, shortness of breath and wheezing.   Cardiovascular: Negative.  Negative for chest pain and palpitations.  Gastrointestinal: Negative.  Negative for  heartburn.  Endocrine: Negative.   Genitourinary: Negative.   Musculoskeletal: Negative.  Negative for myalgias.  Neurological: Negative.  Negative for headaches.  Hematological: Negative.   Psychiatric/Behavioral: Negative.   All other systems reviewed and are negative.      Objective:   Physical Exam  Constitutional: He is oriented to person, place, and time. He appears well-developed and well-nourished. No distress.  HENT:  Head: Normocephalic.  Right Ear: External ear normal.  Left Ear: External ear normal.  Nose: Mucosal edema and rhinorrhea present.  Mouth/Throat: Posterior oropharyngeal erythema present.  Eyes: Pupils are equal, round, and reactive to light. Right eye exhibits no discharge. Left eye exhibits no discharge.  Neck: Normal range of motion. Neck supple. No thyromegaly present.  Cardiovascular: Normal rate, regular rhythm, normal heart sounds and intact distal pulses.   No murmur heard. Pulmonary/Chest: Effort normal and breath sounds normal. No respiratory distress. He has no wheezes.  Abdominal: Soft. Bowel sounds are normal. He exhibits no distension. There is no tenderness.  Musculoskeletal: Normal range of motion. He exhibits no edema or tenderness.  Neurological: He is alert and oriented to person, place, and time.  Skin: Skin is warm and dry. No rash noted. No erythema.  Psychiatric: He has a normal mood and affect. His behavior is normal. Judgment and thought content normal.  Vitals reviewed.    BP 115/74   Pulse 76   Temp 97.4 F (36.3 C)   Ht 5\' 9"  (1.753 m)   Wt 158 lb 3.2 oz (71.8 kg)   BMI 23.36 kg/m       Assessment & Plan:  1. Essential hypertension - CMP14+EGFR  2. Gastroesophageal reflux disease without esophagitis -  CMP14+EGFR  3. Vitamin D deficiency - CMP14+EGFR  4. Hyperlipidemia, unspecified hyperlipidemia type - CMP14+EGFR - Lipid panel  5. Allergic rhinitis, unspecified chronicity, unspecified seasonality,  unspecified trigger - CMP14+EGFR - fluticasone (FLONASE) 50 MCG/ACT nasal spray; Place 2 sprays into both nostrils daily.  Dispense: 16 g; Refill: 6   Continue all meds Labs pending Health Maintenance reviewed Diet and exercise encouraged RTO 6 months   [name], FNP

## 2016-04-21 NOTE — Patient Instructions (Signed)

## 2016-04-22 LAB — CMP14+EGFR
A/G RATIO: 1.5 (ref 1.2–2.2)
ALBUMIN: 4.3 g/dL (ref 3.6–4.8)
ALT: 19 IU/L (ref 0–44)
AST: 26 IU/L (ref 0–40)
Alkaline Phosphatase: 53 IU/L (ref 39–117)
BILIRUBIN TOTAL: 0.3 mg/dL (ref 0.0–1.2)
BUN / CREAT RATIO: 14 (ref 10–24)
BUN: 14 mg/dL (ref 8–27)
CALCIUM: 9 mg/dL (ref 8.6–10.2)
CHLORIDE: 99 mmol/L (ref 96–106)
CO2: 25 mmol/L (ref 18–29)
Creatinine, Ser: 1.02 mg/dL (ref 0.76–1.27)
GFR calc non Af Amer: 75 mL/min/{1.73_m2} (ref >59)
GFR, EST AFRICAN AMERICAN: 86 mL/min/{1.73_m2} (ref >59)
GLOBULIN, TOTAL: 2.8 g/dL (ref 1.5–4.5)
Glucose: 104 mg/dL — ABNORMAL HIGH (ref 65–99)
POTASSIUM: 3.4 mmol/L — AB (ref 3.5–5.2)
Sodium: 143 mmol/L (ref 134–144)
TOTAL PROTEIN: 7.1 g/dL (ref 6.0–8.5)

## 2016-04-22 LAB — LIPID PANEL
CHOL/HDL RATIO: 2.8 ratio (ref 0.0–5.0)
Cholesterol, Total: 170 mg/dL (ref 100–199)
HDL: 61 mg/dL (ref >39)
LDL Calculated: 59 mg/dL (ref 0–99)
Triglycerides: 250 mg/dL — ABNORMAL HIGH (ref 0–149)
VLDL Cholesterol Cal: 50 mg/dL — ABNORMAL HIGH (ref 5–40)

## 2016-04-24 ENCOUNTER — Other Ambulatory Visit: Payer: Self-pay | Admitting: Family

## 2016-04-24 DIAGNOSIS — K219 Gastro-esophageal reflux disease without esophagitis: Secondary | ICD-10-CM

## 2016-07-21 ENCOUNTER — Other Ambulatory Visit: Payer: Self-pay | Admitting: Family

## 2016-07-21 DIAGNOSIS — E785 Hyperlipidemia, unspecified: Secondary | ICD-10-CM

## 2016-10-10 ENCOUNTER — Other Ambulatory Visit: Payer: Self-pay | Admitting: Family

## 2016-10-10 DIAGNOSIS — K219 Gastro-esophageal reflux disease without esophagitis: Secondary | ICD-10-CM

## 2016-10-20 ENCOUNTER — Ambulatory Visit (INDEPENDENT_AMBULATORY_CARE_PROVIDER_SITE_OTHER): Payer: Medicare Other | Admitting: Family

## 2016-10-20 ENCOUNTER — Encounter: Payer: Self-pay | Admitting: Family

## 2016-10-20 VITALS — BP 136/83 | HR 85 | Temp 97.4°F | Ht 69.0 in | Wt 154.6 lb

## 2016-10-20 DIAGNOSIS — F17210 Nicotine dependence, cigarettes, uncomplicated: Secondary | ICD-10-CM

## 2016-10-20 DIAGNOSIS — K219 Gastro-esophageal reflux disease without esophagitis: Secondary | ICD-10-CM | POA: Diagnosis not present

## 2016-10-20 DIAGNOSIS — R09A2 Foreign body sensation, throat: Secondary | ICD-10-CM

## 2016-10-20 DIAGNOSIS — E785 Hyperlipidemia, unspecified: Secondary | ICD-10-CM

## 2016-10-20 DIAGNOSIS — I10 Essential (primary) hypertension: Secondary | ICD-10-CM

## 2016-10-20 DIAGNOSIS — E559 Vitamin D deficiency, unspecified: Secondary | ICD-10-CM

## 2016-10-20 DIAGNOSIS — R7309 Other abnormal glucose: Secondary | ICD-10-CM

## 2016-10-20 DIAGNOSIS — R221 Localized swelling, mass and lump, neck: Secondary | ICD-10-CM

## 2016-10-20 NOTE — Progress Notes (Signed)
Subjective:    Patient ID: Logan Price, male    DOB: 1945/08/13, 71 y.o.   MRN: [id number]  Pt presents to the office today for chronic follow up. Pt complaining of a lump in his throat about 6 months ago. PT states he smoked for 40 years, but quit 10 years ago.  Hypertension  This is a chronic problem. The current episode started more than 1 year ago. The problem has been resolved since onset. The problem is controlled. Pertinent negatives include no headaches, palpitations, peripheral edema or shortness of breath. Risk factors for coronary artery disease include dyslipidemia, obesity and sedentary lifestyle. The current treatment provides moderate improvement. There is no history of kidney disease, CAD/MI, CVA or heart failure.  Hyperlipidemia  This is a chronic problem. The current episode started more than 1 year ago. The problem is controlled. Recent lipid tests were reviewed and are normal. Pertinent negatives include no shortness of breath. Current antihyperlipidemic treatment includes statins. The current treatment provides moderate improvement of lipids. Risk factors for coronary artery disease include dyslipidemia, hypertension, male sex and a sedentary lifestyle.  Gastroesophageal Reflux  He reports no abdominal pain, no belching or no heartburn. This is a chronic problem. The current episode started more than 1 year ago. The problem occurs occasionally. The problem has been resolved. The symptoms are aggravated by certain foods. He has tried a PPI for the symptoms. The treatment provided moderate relief.      Review of Systems  HENT: Negative for trouble swallowing.   Respiratory: Negative for shortness of breath.   Cardiovascular: Negative for palpitations.  Gastrointestinal: Negative for abdominal pain and heartburn.  Neurological: Negative for headaches.  All other systems reviewed and are negative.      Objective:   Physical Exam  Constitutional: He is oriented to  person, place, and time. He appears well-developed and well-nourished. No distress.  HENT:  Head: Normocephalic.  Right Ear: External ear normal.  Left Ear: External ear normal.  Nose: Nose normal.  Mouth/Throat: Oropharynx is clear and moist.  Eyes: Pupils are equal, round, and reactive to light. Right eye exhibits no discharge. Left eye exhibits no discharge.  Neck: Normal range of motion. Neck supple. No thyromegaly (no enlargment felt) present.  Cardiovascular: Normal rate, regular rhythm, normal heart sounds and intact distal pulses.   No murmur heard. Pulmonary/Chest: Effort normal and breath sounds normal. No respiratory distress. He has no wheezes.  Abdominal: Soft. Bowel sounds are normal. He exhibits no distension. There is no tenderness.  Musculoskeletal: Normal range of motion. He exhibits no edema or tenderness.  Neurological: He is alert and oriented to person, place, and time.  Skin: Skin is warm and dry. No rash noted. No erythema.  Psychiatric: He has a normal mood and affect. His behavior is normal. Judgment and thought content normal.  Vitals reviewed.     BP 136/83   Pulse 85   Temp (!) 97.4 F (36.3 C) (Oral)   Ht 5\' 9"  (1.753 m)   Wt 154 lb 9.6 oz (70.1 kg)   BMI 22.83 kg/m      Assessment & Plan:  1. Essential hypertension - CMP14+EGFR  2. Gastroesophageal reflux disease without esophagitis - CMP14+EGFR  3. Hyperlipidemia, unspecified hyperlipidemia type - CMP14+EGFR - Lipid panel  4. Vitamin D deficiency - CMP14+EGFR  5. Sensation of lump in throat - Ambulatory referral to Gastroenterology - US THYROID; Future - CMP14+EGFR  6. Smokes with greater than 40 pack year history -  Ambulatory referral to Gastroenterology - US THYROID; Future - CMP14+EGFR    Continue all meds Labs pending Health Maintenance reviewed Diet and exercise encouraged RTO 6 months   [name], FNP

## 2016-10-20 NOTE — Patient Instructions (Signed)
Globus Pharyngeus Globus pharyngeus is a condition that makes it feel like you have a lump in your throat. It may also feel like you have something stuck in the front of your throat. This feeling may come and go. It is not painful, and it does not make it harder to swallow food or liquid. Globus pharyngeus does not cause changes that a health care provider can see during a physical exam. This condition usually goes away without treatment. What are the causes? Often, no cause can be found. The most common cause of globus pharyngeus is a condition that causes stomach juices to flow back up into the throat (gastroesophageal reflux). Other possible causes include:  Overstimulation of nerves that control swallowing.  Irritation of nerves that control swallowing (neuralgia).  An enlarged gland in the lower neck (thyroid gland).  Growth of tonsil tissue at the base of the tongue (lingual tonsil).  Anxiety.  Depression.  What are the signs or symptoms? The main symptom of this condition is a feeling of a lump in your throat. This feeling usually comes and goes. How is this diagnosed? This condition may be diagnosed after other conditions have been ruled out. You may have tests, such as:  A swallow study.  Ear, nose, and throat evaluation.  An exam of your throat using a thin, flexible tube with a light and camera on the end (endoscopy).  How is this treated? This condition may go away on its own, without treatment. In some cases, antidepressant medicines may be helpful. Follow these instructions at home:  Follow instructions from your health care provider about eating or drinking restrictions.  Take over-the-counter and prescription medicines only as told by your health care provider.  Keep all follow-up visits as told by your health care provider. This is important.  Follow instructions from your health care provider about home care for gastroesophageal reflux. Your health care  provider may recommend that you: ? Do not eat or drink anything that causes heartburn. ? Do not eat heavy meals close to bedtime. ? Do not drink caffeine. ? Do not drink alcohol. ? Raise the head of your bed. ? Sleep on your left side. Contact a health care provider if:  Your symptoms get worse.  You have throat pain.  You have trouble swallowing.  Food or liquid comes back up into your mouth.  You lose weight without trying. Get help right away if:  You develop swelling in your throat. Summary  Globus pharyngeus is a condition that makes it feel like you have a lump in your throat.  This condition usually goes away without treatment. This information is not intended to replace advice given to you by your health care provider. Make sure you discuss any questions you have with your health care provider. Document Released: 11/17/2015 Document Revised: 11/17/2015 Document Reviewed: 11/17/2015 Elsevier Interactive Patient Education  [employer].

## 2016-10-21 LAB — CMP14+EGFR
A/G RATIO: 1.6 (ref 1.2–2.2)
ALT: 22 IU/L (ref 0–44)
AST: 30 IU/L (ref 0–40)
Albumin: 4.3 g/dL (ref 3.6–4.8)
Alkaline Phosphatase: 47 IU/L (ref 39–117)
BILIRUBIN TOTAL: 0.5 mg/dL (ref 0.0–1.2)
BUN / CREAT RATIO: 10 (ref 10–24)
BUN: 11 mg/dL (ref 8–27)
CHLORIDE: 103 mmol/L (ref 96–106)
CO2: 26 mmol/L (ref 20–29)
Calcium: 9.1 mg/dL (ref 8.6–10.2)
Creatinine, Ser: 1.13 mg/dL (ref 0.76–1.27)
GFR, EST AFRICAN AMERICAN: 76 mL/min/{1.73_m2} (ref >59)
GFR, EST NON AFRICAN AMERICAN: 66 mL/min/{1.73_m2} (ref >59)
GLOBULIN, TOTAL: 2.7 g/dL (ref 1.5–4.5)
Glucose: 116 mg/dL — ABNORMAL HIGH (ref 65–99)
POTASSIUM: 3.8 mmol/L (ref 3.5–5.2)
SODIUM: 145 mmol/L — AB (ref 134–144)
TOTAL PROTEIN: 7 g/dL (ref 6.0–8.5)

## 2016-10-21 LAB — LIPID PANEL
CHOL/HDL RATIO: 2.6 ratio (ref 0.0–5.0)
Cholesterol, Total: 143 mg/dL (ref 100–199)
HDL: 56 mg/dL (ref >39)
LDL Calculated: 47 mg/dL (ref 0–99)
Triglycerides: 200 mg/dL — ABNORMAL HIGH (ref 0–149)
VLDL Cholesterol Cal: 40 mg/dL (ref 5–40)

## 2016-10-23 ENCOUNTER — Encounter: Payer: Self-pay | Admitting: Gastroenterology

## 2016-10-23 ENCOUNTER — Other Ambulatory Visit: Payer: Self-pay | Admitting: *Deleted

## 2016-10-23 DIAGNOSIS — R7309 Other abnormal glucose: Secondary | ICD-10-CM

## 2016-10-24 ENCOUNTER — Ambulatory Visit (HOSPITAL_COMMUNITY)
Admission: RE | Admit: 2016-10-24 | Discharge: 2016-10-24 | Disposition: A | Discharge status: Home / Self Care | Payer: Medicare Other | Source: Ambulatory Visit | Attending: Family | Admitting: Family

## 2016-10-24 DIAGNOSIS — F17210 Nicotine dependence, cigarettes, uncomplicated: Secondary | ICD-10-CM

## 2016-10-24 DIAGNOSIS — R1312 Dysphagia, oropharyngeal phase: Secondary | ICD-10-CM | POA: Diagnosis not present

## 2016-10-24 DIAGNOSIS — R221 Localized swelling, mass and lump, neck: Secondary | ICD-10-CM | POA: Diagnosis not present

## 2016-10-24 DIAGNOSIS — R7309 Other abnormal glucose: Secondary | ICD-10-CM | POA: Diagnosis not present

## 2016-10-24 LAB — BAYER DCA HB A1C WAIVED: HB A1C: 5.8 % (ref <7.0)

## 2016-10-24 NOTE — Addendum Note (Signed)
Addended by: Liliane Bade on: 10/24/2016 10:24 AM   Modules accepted: Orders

## 2016-10-24 NOTE — Progress Notes (Signed)
Patient aware.

## 2016-11-04 ENCOUNTER — Other Ambulatory Visit: Payer: Self-pay | Admitting: Family

## 2016-11-04 DIAGNOSIS — J309 Allergic rhinitis, unspecified: Secondary | ICD-10-CM

## 2016-11-06 ENCOUNTER — Other Ambulatory Visit: Payer: Self-pay | Admitting: Family

## 2016-11-06 DIAGNOSIS — K219 Gastro-esophageal reflux disease without esophagitis: Secondary | ICD-10-CM

## 2016-11-21 ENCOUNTER — Telehealth (INDEPENDENT_AMBULATORY_CARE_PROVIDER_SITE_OTHER): Payer: Self-pay | Admitting: Internal Medicine

## 2016-11-21 NOTE — Telephone Encounter (Signed)
Ask patient if he wants to follow up with [facility] concerning his Hep C or our office

## 2016-11-21 NOTE — Telephone Encounter (Signed)
Logan Price, this patient is on our recall for September for a one year follow up, in looking, she has been seen at [facility] for HEP C and has an upcoming appointment with them.  Do we need to schedule this patient a follow up appointment with our office?

## 2016-11-29 NOTE — Telephone Encounter (Signed)
I called the patient, the patient stated that he wanted to follow up with Deberah Castle, NP for his HEP C.  Scheduled him for 12/13/16 at 1:45pm.

## 2016-12-12 ENCOUNTER — Telehealth: Payer: Self-pay

## 2016-12-12 ENCOUNTER — Ambulatory Visit (INDEPENDENT_AMBULATORY_CARE_PROVIDER_SITE_OTHER): Payer: Medicare Other | Admitting: Gastroenterology

## 2016-12-12 ENCOUNTER — Encounter: Payer: Self-pay | Admitting: Gastroenterology

## 2016-12-12 ENCOUNTER — Other Ambulatory Visit: Payer: Self-pay

## 2016-12-12 VITALS — BP 120/71 | HR 80 | Temp 98.3°F | Ht 69.0 in | Wt 155.2 lb

## 2016-12-12 DIAGNOSIS — R0989 Other specified symptoms and signs involving the circulatory and respiratory systems: Secondary | ICD-10-CM

## 2016-12-12 DIAGNOSIS — K219 Gastro-esophageal reflux disease without esophagitis: Secondary | ICD-10-CM | POA: Diagnosis not present

## 2016-12-12 DIAGNOSIS — F458 Other somatoform disorders: Secondary | ICD-10-CM

## 2016-12-12 NOTE — Progress Notes (Addendum)
REVIEWED-NO ADDITIONAL RECOMMENDATIONS.   Primary Care Physician: Sharion Balloon, FNP  Primary Gastroenterologist:  Barney Drain, MD   Chief Complaint  Patient presents with  . lump in throat    HPI: Logan Price is a 71 y.o. male here at the request of Evelina Dun, FNP for sensation of lump in the throat. Symptoms occurring for greater than 9 months. Intermittent. Wakes up at night. Causes urge to cough and clear throat. Initially started on fluticasone spray 03/2016 with no notable improvement. Recently had unremarkable thyroid u/s. Admits to poorly controlled reflux. Takes pantoprazole 20mg  bid per PCP. Has been on it for less than 5 years. No prior EGD. No solid/liquid/pill dysphagia. Appetite good. No abd pain. No bowel concerns. No melena, brbpr.   Used to smoke, greater than 40 pack year history.   Follows at Dr. Olevia Perches office for h/o HCV per patient request and actually has appt there tomorrow.   Current Outpatient Prescriptions  Medication Sig Dispense Refill  . amLODipine (NORVASC) 10 MG tablet Take 1 tablet (10 mg total) by mouth daily. 90 tablet 1  . cholecalciferol (VITAMIN D) 1000 UNITS tablet Take 2,000 Units by mouth daily.     . fluticasone (FLONASE) 50 MCG/ACT nasal spray PLACE 2 SPRAYS INTO BOTH NOSTRILS DAILY. 16 g 5  . Multiple Vitamin (MULTIVITAMIN WITH MINERALS) TABS tablet Take 1 tablet by mouth daily.    . pantoprazole (PROTONIX) 20 MG tablet TAKE 1 TABLET (20 MG TOTAL) BY MOUTH 2 (TWO) TIMES DAILY. 180 tablet 0  . simvastatin (ZOCOR) 20 MG tablet TAKE 1 TABLET AT BEDTIME 90 tablet 1   No current facility-administered medications for this visit.     Allergies as of 12/12/2016  . (No Known Allergies)   Past Medical History:  Diagnosis Date  . Diverticulitis   . GERD (gastroesophageal reflux disease)   . Hepatitis C   . HTN (hypertension)   . Hyperlipidemia    Past Surgical History:  Procedure Laterality Date  . BIOPSY  05/04/2015   Procedure: BIOPSY;  Surgeon: Danie Binder, MD;  Location: [facility];  Service: Endoscopy;;  bx's of ileocecal valve   . COLONOSCOPY WITH PROPOFOL N/A 05/04/2015   Dr. Oneida Alar: normal appearing ileum with prominent IC valve with tubular adenomas, moderate diverticulosis in sigmoid colon, ascending colon, and retum. Moderate sized internal hemorrhoids. Surveillance in 5 years  . FLEXIBLE SIGMOIDOSCOPY N/A 12/10/2015   hemorrhoid banding X 3   . HEMORRHOID BANDING N/A 12/10/2015   Procedure: HEMORRHOID BANDING;  Surgeon: Danie Binder, MD;  Location: [facility];  Service: Endoscopy;  Laterality: N/A;  1:30 PM  . None to date     As of 04/14/15  . POLYPECTOMY  05/04/2015   Procedure: POLYPECTOMY;  Surgeon: Danie Binder, MD;  Location: [facility];  Service: Endoscopy;;  descending colon polyp, ascending colon polyp   Family History  Problem Relation Age of Onset  . Cancer Mother   . Hypertension Father   . Hypertension Sister   . Hypertension Brother   . Hypertension Sister   . Stroke Brother        hemorrhagic  . Colon cancer Neg Hx    Social History   Social History  . Marital status: Married    Spouse name: N/A  . Number of children: N/A  . Years of education: N/A   Social History Main Topics  . Smoking status: Former Smoker    Quit date: 10/09/2006  .  Smokeless tobacco: Never Used     Comment: quit x 9 years  . Alcohol use 0.0 oz/week     Comment: 7 oz of vodka per night  . Drug use: No  . Sexual activity: Not Asked   Other Topics Concern  . None   Social History Narrative  . None    ROS:  General: Negative for anorexia, weight loss, fever, chills, fatigue, weakness. ENT: Negative for hoarseness, difficulty swallowing , nasal congestion.see hpi. CV: Negative for chest pain, angina, palpitations, dyspnea on exertion, peripheral edema.  Respiratory: Negative for dyspnea at rest, dyspnea on exertion, cough, sputum, wheezing.  GI: See history of present  illness. GU:  Negative for dysuria, hematuria, urinary incontinence, urinary frequency, nocturnal urination.  Endo: Negative for unusual weight change.    Physical Examination:   BP 120/71   Pulse 80   Temp 98.3 F (36.8 C) (Oral)   Ht 5\' 9"  (1.753 m)   Wt 155 lb 3.2 oz (70.4 kg)   BMI 22.92 kg/m   General: Well-nourished, well-developed in no acute distress.  Eyes: No icterus. Mouth: Oropharyngeal mucosa moist and pink , no lesions erythema or exudate. Lungs: Clear to auscultation bilaterally.  Heart: Regular rate and rhythm, no murmurs rubs or gallops.  Abdomen: Bowel sounds are normal, nontender, nondistended, no hepatosplenomegaly or masses, no abdominal bruits or hernia , no rebound or guarding.   Extremities: No lower extremity edema. No clubbing or deformities. Neuro: Alert and oriented x 4   Skin: Warm and dry, no jaundice.   Psych: Alert and cooperative, normal mood and affect.  Labs:  Lab Results  Component Value Date   CREATININE 1.13 10/20/2016   BUN 11 10/20/2016   NA 145 (H) 10/20/2016   K 3.8 10/20/2016   CL 103 10/20/2016   CO2 26 10/20/2016   Lab Results  Component Value Date   ALT 22 10/20/2016   AST 30 10/20/2016   ALKPHOS 47 10/20/2016   BILITOT 0.5 10/20/2016    No results found for: TSH  Imaging Studies: No results found.

## 2016-12-12 NOTE — Patient Instructions (Signed)
1. Upper endoscopy with Dr. Oneida Alar. Please see separate instructions.

## 2016-12-12 NOTE — Patient Instructions (Signed)
Logan Price  12/12/2016     @PREFPERIOPPHARMACY @   Your procedure is scheduled on  12/19/2016 .  Report to [facility] at  1000  A.M.  Call this number if you have problems the morning of surgery:  [phone]   Remember:  Do not eat food or drink liquids after midnight.  Take these medicines the morning of surgery with A SIP OF WATER  Norvasc, protonix.   Do not wear jewelry, make-up or nail polish.  Do not wear lotions, powders, or perfumes, or deoderant.  Do not shave 48 hours prior to surgery.  Men may shave face and neck.  Do not bring valuables to the hospital.  [facility] is not responsible for any belongings or valuables.  Contacts, dentures or bridgework may not be worn into surgery.  Leave your suitcase in the car.  After surgery it may be brought to your room.  For patients admitted to the hospital, discharge time will be determined by your treatment team.  Patients discharged the day of surgery will not be allowed to drive home.   Name and phone number of your driver:   family Special instructions:  Follow the diet instructions given to you by Dr Nona Dell office.  Please read over the following fact sheets that you were given. Anesthesia Post-op Instructions and Care and Recovery After Surgery       Esophagogastroduodenoscopy Esophagogastroduodenoscopy (EGD) is a procedure to examine the lining of the esophagus, stomach, and first part of the small intestine (duodenum). This procedure is done to check for problems such as inflammation, bleeding, ulcers, or growths. During this procedure, a long, flexible, lighted tube with a camera attached (endoscope) is inserted down the throat. Tell a health care provider about:  Any allergies you have.  All medicines you are taking, including vitamins, herbs, eye drops, creams, and over-the-counter medicines.  Any problems you or family members have had with anesthetic medicines.  Any blood  disorders you have.  Any surgeries you have had.  Any medical conditions you have.  Whether you are pregnant or may be pregnant. What are the risks? Generally, this is a safe procedure. However, problems may occur, including:  Infection.  Bleeding.  A tear (perforation) in the esophagus, stomach, or duodenum.  Trouble breathing.  Excessive sweating.  Spasms of the larynx.  A slowed heartbeat.  Low blood pressure.  What happens before the procedure?  Follow instructions from your health care provider about eating or drinking restrictions.  Ask your health care provider about: ? Changing or stopping your regular medicines. This is especially important if you are taking diabetes medicines or blood thinners. ? Taking medicines such as aspirin and ibuprofen. These medicines can thin your blood. Do not take these medicines before your procedure if your health care provider instructs you not to.  Plan to have someone take you home after the procedure.  If you wear dentures, be ready to remove them before the procedure. What happens during the procedure?  To reduce your risk of infection, your health care team will wash or sanitize their hands.  An IV tube will be put in a vein in your hand or arm. You will get medicines and fluids through this tube.  You will be given one or more of the following: ? A medicine to help you relax (sedative). ? A medicine to numb the area (local anesthetic). This medicine may be sprayed  into your throat. It will make you feel more comfortable and keep you from gagging or coughing during the procedure. ? A medicine for pain.  A mouth guard may be placed in your mouth to protect your teeth and to keep you from biting on the endoscope.  You will be asked to lie on your left side.  The endoscope will be lowered down your throat into your esophagus, stomach, and duodenum.  Air will be put into the endoscope. This will help your health care  provider see better.  The lining of your esophagus, stomach, and duodenum will be examined.  Your health care provider may: ? Take a tissue sample so it can be looked at in a lab (biopsy). ? Remove growths. ? Remove objects (foreign bodies) that are stuck. ? Treat any bleeding with medicines or other devices that stop tissue from bleeding. ? Widen (dilate) or stretch narrowed areas of your esophagus and stomach.  The endoscope will be taken out. The procedure may vary among health care providers and hospitals. What happens after the procedure?  Your blood pressure, heart rate, breathing rate, and blood oxygen level will be monitored often until the medicines you were given have worn off.  Do not eat or drink anything until the numbing medicine has worn off and your gag reflex has returned. This information is not intended to replace advice given to you by your health care provider. Make sure you discuss any questions you have with your health care provider. Document Released: 07/14/2004 Document Revised: 08/19/2015 Document Reviewed: 02/04/2015 Elsevier Interactive Patient Education  2018 [employer]. Esophagogastroduodenoscopy, Care After Refer to this sheet in the next few weeks. These instructions provide you with information about caring for yourself after your procedure. Your health care provider may also give you more specific instructions. Your treatment has been planned according to current medical practices, but problems sometimes occur. Call your health care provider if you have any problems or questions after your procedure. What can I expect after the procedure? After the procedure, it is common to have:  A sore throat.  Nausea.  Bloating.  Dizziness.  Fatigue.  Follow these instructions at home:  Do not eat or drink anything until the numbing medicine (local anesthetic) has worn off and your gag reflex has returned. You will know that the local anesthetic has worn  off when you can swallow comfortably.  Do not drive for 24 hours if you received a medicine to help you relax (sedative).  If your health care provider took a tissue sample for testing during the procedure, make sure to get your test results. This is your responsibility. Ask your health care provider or the department performing the test when your results will be ready.  Keep all follow-up visits as told by your health care provider. This is important. Contact a health care provider if:  You cannot stop coughing.  You are not urinating.  You are urinating less than usual. Get help right away if:  You have trouble swallowing.  You cannot eat or drink.  You have throat or chest pain that gets worse.  You are dizzy or light-headed.  You faint.  You have nausea or vomiting.  You have chills.  You have a fever.  You have severe abdominal pain.  You have black, tarry, or bloody stools. This information is not intended to replace advice given to you by your health care provider. Make sure you discuss any questions you have with your health  care provider. Document Released: 02/28/2012 Document Revised: 08/19/2015 Document Reviewed: 02/04/2015 Elsevier Interactive Patient Education  2018 [employer].  Esophageal Dilatation Esophageal dilatation is a procedure to open a blocked or narrowed part of the esophagus. The esophagus is the long tube in your throat that carries food and liquid from your mouth to your stomach. The procedure is also called esophageal dilation. You may need this procedure if you have a buildup of scar tissue in your esophagus that makes it difficult, painful, or even impossible to swallow. This can be caused by gastroesophageal reflux disease (GERD). In rare cases, people need this procedure because they have cancer of the esophagus or a problem with the way food moves through the esophagus. Sometimes you may need to have another dilatation to enlarge the  opening of the esophagus gradually. Tell a health care provider about:  Any allergies you have.  All medicines you are taking, including vitamins, herbs, eye drops, creams, and over-the-counter medicines.  Any problems you or family members have had with anesthetic medicines.  Any blood disorders you have.  Any surgeries you have had.  Any medical conditions you have.  Any antibiotic medicines you are required to take before dental procedures. What are the risks? Generally, this is a safe procedure. However, problems can occur and include:  Bleeding from a tear in the lining of the esophagus.  A hole (perforation) in the esophagus.  What happens before the procedure?  Do not eat or drink anything after midnight on the night before the procedure or as directed by your health care provider.  Ask your health care provider about changing or stopping your regular medicines. This is especially important if you are taking diabetes medicines or blood thinners.  Plan to have someone take you home after the procedure. What happens during the procedure?  You will be given a medicine that makes you relaxed and sleepy (sedative).  A medicine may be sprayed or gargled to numb the back of the throat.  Your health care provider can use various instruments to do an esophageal dilatation. During the procedure, the instrument used will be placed in your mouth and passed down into your esophagus. Options include: ? Simple dilators. This instrument is carefully placed in the esophagus to stretch it. ? Guided wire bougies. In this method, a flexible tube (endoscope) is used to insert a wire into the esophagus. The dilator is passed over this wire to enlarge the esophagus. Then the wire is removed. ? Balloon dilators. An endoscope with a small balloon at the end is passed down into the esophagus. Inflating the balloon gently stretches the esophagus and opens it up. What happens after the  procedure?  Your blood pressure, heart rate, breathing rate, and blood oxygen level will be monitored often until the medicines you were given have worn off.  Your throat may feel slightly sore and will probably still feel numb. This will improve slowly over time.  You will not be allowed to eat or drink until the throat numbness has resolved.  If this is a same-day procedure, you may be allowed to go home once you have been able to drink, urinate, and sit on the edge of the bed without nausea or dizziness.  If this is a same-day procedure, you should have a friend or family member with you for the next 24 hours after the procedure. This information is not intended to replace advice given to you by your health care provider. Make sure you  discuss any questions you have with your health care provider. Document Released: 05/04/2005 Document Revised: 08/19/2015 Document Reviewed: 07/23/2013 Elsevier Interactive Patient Education  2017 [place] Anesthesia is a term that refers to techniques, procedures, and medicines that help a person stay safe and comfortable during a medical procedure. Monitored anesthesia care, or sedation, is one type of anesthesia. Your anesthesia specialist may recommend sedation if you will be having a procedure that does not require you to be unconscious, such as:  Cataract surgery.  A dental procedure.  A biopsy.  A colonoscopy.  During the procedure, you may receive a medicine to help you relax (sedative). There are three levels of sedation:  Mild sedation. At this level, you may feel awake and relaxed. You will be able to follow directions.  Moderate sedation. At this level, you will be sleepy. You may not remember the procedure.  Deep sedation. At this level, you will be asleep. You will not remember the procedure.  The more medicine you are given, the deeper your level of sedation will be. Depending on how you respond to  the procedure, the anesthesia specialist may change your level of sedation or the type of anesthesia to fit your needs. An anesthesia specialist will monitor you closely during the procedure. Let your health care provider know about:  Any allergies you have.  All medicines you are taking, including vitamins, herbs, eye drops, creams, and over-the-counter medicines.  Any use of steroids (by mouth or as a cream).  Any problems you or family members have had with sedatives and anesthetic medicines.  Any blood disorders you have.  Any surgeries you have had.  Any medical conditions you have, such as sleep apnea.  Whether you are pregnant or may be pregnant.  Any use of cigarettes, alcohol, or street drugs. What are the risks? Generally, this is a safe procedure. However, problems may occur, including:  Getting too much medicine (oversedation).  Nausea.  Allergic reaction to medicines.  Trouble breathing. If this happens, a breathing tube may be used to help with breathing. It will be removed when you are awake and breathing on your own.  Heart trouble.  Lung trouble.  Before the procedure Staying hydrated Follow instructions from your health care provider about hydration, which may include:  Up to 2 hours before the procedure - you may continue to drink clear liquids, such as water, clear fruit juice, black coffee, and plain tea.  Eating and drinking restrictions Follow instructions from your health care provider about eating and drinking, which may include:  8 hours before the procedure - stop eating heavy meals or foods such as meat, fried foods, or fatty foods.  6 hours before the procedure - stop eating light meals or foods, such as toast or cereal.  6 hours before the procedure - stop drinking milk or drinks that contain milk.  2 hours before the procedure - stop drinking clear liquids.  Medicines Ask your health care provider about:  Changing or stopping your  regular medicines. This is especially important if you are taking diabetes medicines or blood thinners.  Taking medicines such as aspirin and ibuprofen. These medicines can thin your blood. Do not take these medicines before your procedure if your health care provider instructs you not to.  Tests and exams  You will have a physical exam.  You may have blood tests done to show: ? How well your kidneys and liver are working. ? How well your  blood can clot.  General instructions  Plan to have someone take you home from the hospital or clinic.  If you will be going home right after the procedure, plan to have someone with you for 24 hours.  What happens during the procedure?  Your blood pressure, heart rate, breathing, level of pain and overall condition will be monitored.  An IV tube will be inserted into one of your veins.  Your anesthesia specialist will give you medicines as needed to keep you comfortable during the procedure. This may mean changing the level of sedation.  The procedure will be performed. After the procedure  Your blood pressure, heart rate, breathing rate, and blood oxygen level will be monitored until the medicines you were given have worn off.  Do not drive for 24 hours if you received a sedative.  You may: ? Feel sleepy, clumsy, or nauseous. ? Feel forgetful about what happened after the procedure. ? Have a sore throat if you had a breathing tube during the procedure. ? Vomit. This information is not intended to replace advice given to you by your health care provider. Make sure you discuss any questions you have with your health care provider. Document Released: 12/07/2004 Document Revised: 08/20/2015 Document Reviewed: 07/04/2015 Elsevier Interactive Patient Education  2018 [place], Care After These instructions provide you with information about caring for yourself after your procedure. Your health care provider may  also give you more specific instructions. Your treatment has been planned according to current medical practices, but problems sometimes occur. Call your health care provider if you have any problems or questions after your procedure. What can I expect after the procedure? After your procedure, it is common to:  Feel sleepy for several hours.  Feel clumsy and have poor balance for several hours.  Feel forgetful about what happened after the procedure.  Have poor judgment for several hours.  Feel nauseous or vomit.  Have a sore throat if you had a breathing tube during the procedure.  Follow these instructions at home: For at least 24 hours after the procedure:   Do not: ? Participate in activities in which you could fall or become injured. ? Drive. ? Use heavy machinery. ? Drink alcohol. ? Take sleeping pills or medicines that cause drowsiness. ? Make important decisions or sign legal documents. ? Take care of children on your own.  Rest. Eating and drinking  Follow the diet that is recommended by your health care provider.  If you vomit, drink water, juice, or soup when you can drink without vomiting.  Make sure you have little or no nausea before eating solid foods. General instructions  Have a responsible adult stay with you until you are awake and alert.  Take over-the-counter and prescription medicines only as told by your health care provider.  If you smoke, do not smoke without supervision.  Keep all follow-up visits as told by your health care provider. This is important. Contact a health care provider if:  You keep feeling nauseous or you keep vomiting.  You feel light-headed.  You develop a rash.  You have a fever. Get help right away if:  You have trouble breathing. This information is not intended to replace advice given to you by your health care provider. Make sure you discuss any questions you have with your health care provider. Document  Released: 07/04/2015 Document Revised: 11/03/2015 Document Reviewed: 07/04/2015 Elsevier Interactive Patient Education  [employer].

## 2016-12-12 NOTE — Assessment & Plan Note (Signed)
71 y/o male with 9 month h/o intermittent globus sensation. Nocturnal symptoms wakes up from sleep and associated with cough. Complains of refractory heartburn. Discussed with patient, cannot rule out reflux as source but would have low threshold for sending for ENT referral given chronic smoking history and somewhat vague symptoms.   Offered EGD+/-ED with Dr. Oneida Alar in [place].  I have discussed the risks, alternatives, benefits with regards to but not limited to the risk of reaction to medication, bleeding, infection, perforation and the patient is agreeable to proceed. Written consent to be obtained.  If EGD unremarkable, consider ENT referral.

## 2016-12-12 NOTE — Progress Notes (Signed)
cc'ed to pcp °

## 2016-12-12 NOTE — Telephone Encounter (Signed)
Called and informed pt of pre-op appt 12/15/16 at 2:45pm.

## 2016-12-13 ENCOUNTER — Encounter (INDEPENDENT_AMBULATORY_CARE_PROVIDER_SITE_OTHER): Payer: Self-pay | Admitting: Internal Medicine

## 2016-12-13 ENCOUNTER — Ambulatory Visit (INDEPENDENT_AMBULATORY_CARE_PROVIDER_SITE_OTHER): Payer: Medicare Other | Admitting: Internal Medicine

## 2016-12-13 ENCOUNTER — Encounter (INDEPENDENT_AMBULATORY_CARE_PROVIDER_SITE_OTHER): Payer: Self-pay | Admitting: *Deleted

## 2016-12-13 VITALS — BP 118/62 | HR 72 | Temp 98.0°F | Ht 69.0 in | Wt 156.5 lb

## 2016-12-13 DIAGNOSIS — R768 Other specified abnormal immunological findings in serum: Secondary | ICD-10-CM

## 2016-12-13 DIAGNOSIS — B182 Chronic viral hepatitis C: Secondary | ICD-10-CM

## 2016-12-13 HISTORY — DX: Other specified abnormal immunological findings in serum: R76.8

## 2016-12-13 NOTE — Patient Instructions (Addendum)
Labs today. [place] to be scheduled.

## 2016-12-13 NOTE — Progress Notes (Signed)
Subjective:    Patient ID: Logan Price, male    DOB: 01/19/1946, 71 y.o.   MRN: [id number]  HPI Here today for f/u of his +Hep C antibody. Hep C quaint has been undetected.  Past hx of IV drugs. No tattoos.  Finished Hep A and B series.  Last seen in September of 2017.  He tells me he is doing good.  Appetite is good.  Wt in September of 2017 was 160. Today his weight is 156.5lb His BMs are normal. No melena or BRRB   No prior hx of jaundice.  12/06/2015 Hep C quaint undetected.  12/09/2015 US abdomen/complete: IMPRESSION: 1. Fatty infiltrative change of the liver. No discrete mass or surface contour irregularity twos erect suggest malignancy or cirrhosis. Normal splenic size. No ascites. 2. No gallstones or sonographic evidence of acute cholecystitis. Limited visualization of the pancreas. 3. Simple appearing cysts in both kidneys.  No hydronephrosis. 4. Normal appearance of the abdominal aorta and IVC.   Review of Systems Past Medical History:  Diagnosis Date  . Diverticulitis   . 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 GERD (gastroesophageal reflux disease)   . Hepatitis C   . HTN (hypertension)   . Hyperlipidemia     Past Surgical History:  Procedure Laterality Date  . BIOPSY  05/04/2015   Procedure: BIOPSY;  Surgeon: West Bali, MD;  Location: [facility];  Service: Endoscopy;;  bx's of ileocecal valve   . COLONOSCOPY WITH PROPOFOL N/A 05/04/2015   Dr. Darrick Penna: normal appearing ileum with prominent IC valve with tubular adenomas, moderate diverticulosis in sigmoid colon, ascending colon, and retum. Moderate sized internal hemorrhoids. Surveillance in 5 years  . FLEXIBLE SIGMOIDOSCOPY N/A 12/10/2015   hemorrhoid banding X 3   . HEMORRHOID BANDING N/A 12/10/2015   Procedure: HEMORRHOID BANDING;  Surgeon: West Bali, MD;  Location: [facility];  Service: Endoscopy;  Laterality: N/A;  1:30 PM  . None to date     As of 04/14/15  .  POLYPECTOMY  05/04/2015   Procedure: POLYPECTOMY;  Surgeon: West Bali, MD;  Location: [facility];  Service: Endoscopy;;  descending colon polyp, ascending colon polyp    No Known Allergies  Current Outpatient Prescriptions on File Prior to Visit  Medication Sig Dispense Refill  . amLODipine (NORVASC) 10 MG tablet Take 1 tablet (10 mg total) by mouth daily. 90 tablet 1  . cholecalciferol (VITAMIN D) 1000 UNITS tablet Take 2,000 Units by mouth daily.     . fluticasone (FLONASE) 50 MCG/ACT nasal spray PLACE 2 SPRAYS INTO BOTH NOSTRILS DAILY. 16 g 5  . Multiple Vitamin (MULTIVITAMIN WITH MINERALS) TABS tablet Take 1 tablet by mouth daily.    . pantoprazole (PROTONIX) 20 MG tablet TAKE 1 TABLET (20 MG TOTAL) BY MOUTH 2 (TWO) TIMES DAILY. 180 tablet 0  . simvastatin (ZOCOR) 20 MG tablet TAKE 1 TABLET AT BEDTIME 90 tablet 1   No current facility-administered medications on file prior to visit.         Objective:   Physical Exam Blood pressure 118/62, pulse 72, temperature 98 F (36.7 C), height 5\' 9"  (1.753 m), weight 156 lb 8 oz (71 kg). Alert and oriented. Skin warm and dry. Oral mucosa is moist.   . Sclera anicteric, conjunctivae is pink. Thyroid not enlarged. No cervical lymphadenopathy. Lungs clear. Heart regular rate and rhythm.  Abdomen is soft. Bowel sounds are positive. No hepatomegaly. No abdominal masses felt.  No tenderness.  No edema to lower extremities.  .         Assessment & Plan:  Hep C + antibody. Will get Hep C quaint and US abdomen. No further needed if quaint is undetected. Hep C quaint and Hepatic function.  Will follow every 6 months on [place].

## 2016-12-15 ENCOUNTER — Other Ambulatory Visit: Payer: Self-pay

## 2016-12-15 ENCOUNTER — Other Ambulatory Visit: Payer: Self-pay | Admitting: Nurse Practitioner

## 2016-12-15 ENCOUNTER — Encounter (HOSPITAL_COMMUNITY): Payer: Self-pay

## 2016-12-15 ENCOUNTER — Encounter (HOSPITAL_COMMUNITY)
Admission: RE | Admit: 2016-12-15 | Discharge: 2016-12-15 | Disposition: A | Discharge status: Home / Self Care | Payer: Medicare Other | Source: Ambulatory Visit | Attending: Gastroenterology | Admitting: Gastroenterology

## 2016-12-15 DIAGNOSIS — R9431 Abnormal electrocardiogram [ECG] [EKG]: Secondary | ICD-10-CM | POA: Insufficient documentation

## 2016-12-15 DIAGNOSIS — K219 Gastro-esophageal reflux disease without esophagitis: Secondary | ICD-10-CM | POA: Diagnosis not present

## 2016-12-15 DIAGNOSIS — Z01812 Encounter for preprocedural laboratory examination: Secondary | ICD-10-CM | POA: Insufficient documentation

## 2016-12-15 DIAGNOSIS — Z01818 Encounter for other preprocedural examination: Secondary | ICD-10-CM | POA: Insufficient documentation

## 2016-12-15 LAB — HEPATIC FUNCTION PANEL
AG Ratio: 1.8 (calc) (ref 1.0–2.5)
ALT: 13 U/L (ref 9–46)
AST: 19 U/L (ref 10–35)
Albumin: 4.2 g/dL (ref 3.6–5.1)
Alkaline phosphatase (APISO): 43 U/L (ref 40–115)
BILIRUBIN INDIRECT: 0.5 mg/dL (ref 0.2–1.2)
BILIRUBIN TOTAL: 0.7 mg/dL (ref 0.2–1.2)
Bilirubin, Direct: 0.2 mg/dL (ref 0.0–0.2)
Globulin: 2.3 g/dL (calc) (ref 1.9–3.7)
TOTAL PROTEIN: 6.5 g/dL (ref 6.1–8.1)

## 2016-12-15 LAB — BASIC METABOLIC PANEL
ANION GAP: 9 (ref 5–15)
BUN: 7 mg/dL (ref 6–20)
CHLORIDE: 105 mmol/L (ref 101–111)
CO2: 28 mmol/L (ref 22–32)
CREATININE: 0.99 mg/dL (ref 0.61–1.24)
Calcium: 8.7 mg/dL — ABNORMAL LOW (ref 8.9–10.3)
GFR calc non Af Amer: >60 mL/min (ref >60)
Glucose, Bld: 116 mg/dL — ABNORMAL HIGH (ref 65–99)
Potassium: 2.9 mmol/L — ABNORMAL LOW (ref 3.5–5.1)
Sodium: 142 mmol/L (ref 135–145)

## 2016-12-15 LAB — CBC WITH DIFFERENTIAL/PLATELET
BASOS ABS: 0.1 10*3/uL (ref 0.0–0.1)
BASOS PCT: 1 %
Eosinophils Absolute: 0.1 10*3/uL (ref 0.0–0.7)
Eosinophils Relative: 1 %
HEMATOCRIT: 38.8 % — AB (ref 39.0–52.0)
HEMOGLOBIN: 13.2 g/dL (ref 13.0–17.0)
Lymphocytes Relative: 38 %
Lymphs Abs: 2.4 10*3/uL (ref 0.7–4.0)
MCH: 30.2 pg (ref 26.0–34.0)
MCHC: 34 g/dL (ref 30.0–36.0)
MCV: 88.8 fL (ref 78.0–100.0)
Monocytes Absolute: 0.5 10*3/uL (ref 0.1–1.0)
Monocytes Relative: 8 %
NEUTROS ABS: 3.4 10*3/uL (ref 1.7–7.7)
NEUTROS PCT: 52 %
Platelets: 230 10*3/uL (ref 150–400)
RBC: 4.37 MIL/uL (ref 4.22–5.81)
RDW: 15 % (ref 11.5–15.5)
WBC: 6.4 10*3/uL (ref 4.0–10.5)

## 2016-12-15 LAB — HEPATITIS C RNA QUANTITATIVE
HCV QUANT LOG: NOT DETECTED {Log_IU}/mL
HCV RNA, PCR, QN: <15 IU/mL

## 2016-12-15 MED ORDER — POTASSIUM CHLORIDE ER 10 MEQ PO TBCR: 20.0000 meq | EXTENDED_RELEASE_TABLET | Freq: Two times a day (BID) | ORAL | 0 refills | Status: DC

## 2016-12-15 NOTE — Pre-Procedure Instructions (Signed)
Potassium of 2.9 called to [place], [place]. He is going to call in Potassium Rx to pharmacy for patient. I called and left patient message on 2 separate numbers to go to pharmacy and pick this up and start taking it tonight, as Randall Hiss asked me to do. Unable after several attempts to speak with patient directly. Also attempted to contact his wife but was unable to contact her as well.

## 2016-12-15 NOTE — Progress Notes (Signed)
Phone call received from short stay, patient's potassium 2.9. Normal kidney function. Will send in KCl 20 mEq bid x 6 doses. Endo agreed to call and notify patient to pick up Rx.

## 2016-12-18 NOTE — Pre-Procedure Instructions (Signed)
Dr Patsey Berthold aware of potassium and rx being called in. Will do IStat on arrival 12/19/2016.

## 2016-12-19 ENCOUNTER — Ambulatory Visit (HOSPITAL_COMMUNITY)
Admission: RE | Admit: 2016-12-19 | Discharge: 2016-12-19 | Disposition: A | Discharge status: Home / Self Care | Payer: Medicare Other | Source: Ambulatory Visit | Attending: Gastroenterology | Admitting: Gastroenterology

## 2016-12-19 ENCOUNTER — Encounter (HOSPITAL_COMMUNITY)
Admission: RE | Disposition: A | Discharge status: Home / Self Care | Payer: Self-pay | Source: Ambulatory Visit | Attending: Gastroenterology

## 2016-12-19 ENCOUNTER — Ambulatory Visit (HOSPITAL_COMMUNITY): Payer: Medicare Other | Admitting: Anesthesiology

## 2016-12-19 ENCOUNTER — Other Ambulatory Visit: Payer: Self-pay

## 2016-12-19 ENCOUNTER — Telehealth: Payer: Self-pay | Admitting: Gastroenterology

## 2016-12-19 ENCOUNTER — Encounter (HOSPITAL_COMMUNITY): Payer: Self-pay | Admitting: *Deleted

## 2016-12-19 DIAGNOSIS — E785 Hyperlipidemia, unspecified: Secondary | ICD-10-CM | POA: Insufficient documentation

## 2016-12-19 DIAGNOSIS — K219 Gastro-esophageal reflux disease without esophagitis: Secondary | ICD-10-CM | POA: Diagnosis not present

## 2016-12-19 DIAGNOSIS — R1319 Other dysphagia: Secondary | ICD-10-CM

## 2016-12-19 DIAGNOSIS — R1310 Dysphagia, unspecified: Secondary | ICD-10-CM | POA: Diagnosis not present

## 2016-12-19 DIAGNOSIS — I10 Essential (primary) hypertension: Secondary | ICD-10-CM | POA: Diagnosis not present

## 2016-12-19 DIAGNOSIS — K222 Esophageal obstruction: Secondary | ICD-10-CM | POA: Diagnosis not present

## 2016-12-19 DIAGNOSIS — Z79899 Other long term (current) drug therapy: Secondary | ICD-10-CM | POA: Diagnosis not present

## 2016-12-19 DIAGNOSIS — R0989 Other specified symptoms and signs involving the circulatory and respiratory systems: Secondary | ICD-10-CM

## 2016-12-19 DIAGNOSIS — K2970 Gastritis, unspecified, without bleeding: Secondary | ICD-10-CM

## 2016-12-19 DIAGNOSIS — Z87891 Personal history of nicotine dependence: Secondary | ICD-10-CM | POA: Diagnosis not present

## 2016-12-19 DIAGNOSIS — K3189 Other diseases of stomach and duodenum: Secondary | ICD-10-CM | POA: Diagnosis not present

## 2016-12-19 HISTORY — PX: ESOPHAGOGASTRODUODENOSCOPY (EGD) WITH PROPOFOL: SHX5813

## 2016-12-19 HISTORY — PX: SAVORY DILATION: SHX5439

## 2016-12-19 LAB — POCT I-STAT 4, (NA,K, GLUC, HGB,HCT)
GLUCOSE: 116 mg/dL — AB (ref 65–99)
HEMATOCRIT: 41 % (ref 39.0–52.0)
HEMOGLOBIN: 13.9 g/dL (ref 13.0–17.0)
POTASSIUM: 4.1 mmol/L (ref 3.5–5.1)
SODIUM: 142 mmol/L (ref 135–145)

## 2016-12-19 SURGERY — ESOPHAGOGASTRODUODENOSCOPY (EGD) WITH PROPOFOL
Anesthesia: Monitor Anesthesia Care

## 2016-12-19 MED ORDER — LIDOCAINE VISCOUS 2 % MT SOLN
OROMUCOSAL | Status: AC
  Filled 2016-12-19: qty 15

## 2016-12-19 MED ORDER — MIDAZOLAM HCL 2 MG/2ML IJ SOLN
INTRAMUSCULAR | Status: AC
  Filled 2016-12-19: qty 2

## 2016-12-19 MED ORDER — FENTANYL CITRATE (PF) 100 MCG/2ML IJ SOLN
INTRAMUSCULAR | Status: AC
  Filled 2016-12-19: qty 2

## 2016-12-19 MED ORDER — PROPOFOL 500 MG/50ML IV EMUL
INTRAVENOUS | Status: DC | PRN
  Administered 2016-12-19: 100 ug/kg/min via INTRAVENOUS

## 2016-12-19 MED ORDER — CHLORHEXIDINE GLUCONATE CLOTH 2 % EX PADS: 6.0000 | MEDICATED_PAD | Freq: Once | CUTANEOUS | Status: DC

## 2016-12-19 MED ORDER — MIDAZOLAM HCL 2 MG/2ML IJ SOLN
1.0000 mg | INTRAMUSCULAR | Status: AC
  Administered 2016-12-19: 2 mg via INTRAVENOUS

## 2016-12-19 MED ORDER — LIDOCAINE VISCOUS 2 % MT SOLN
13.0000 mL | Freq: Once | OROMUCOSAL | Status: AC
  Administered 2016-12-19: 6 mL via OROMUCOSAL

## 2016-12-19 MED ORDER — LACTATED RINGERS IV SOLN
INTRAVENOUS | Status: DC
  Administered 2016-12-19: 11:00:00 via INTRAVENOUS

## 2016-12-19 MED ORDER — FENTANYL CITRATE (PF) 100 MCG/2ML IJ SOLN
25.0000 ug | Freq: Once | INTRAMUSCULAR | Status: AC
  Administered 2016-12-19: 25 ug via INTRAVENOUS

## 2016-12-19 NOTE — Anesthesia Postprocedure Evaluation (Signed)
Anesthesia Post Note  Patient: Logan Price  Procedure(s) Performed: Procedure(s) (LRB): ESOPHAGOGASTRODUODENOSCOPY (EGD) WITH PROPOFOL (N/A) SAVORY DILATION (N/A)  Patient location during evaluation: PACU Anesthesia Type: MAC Level of consciousness: awake and patient cooperative Pain management: pain level controlled Vital Signs Assessment: post-procedure vital signs reviewed and stable Respiratory status: spontaneous breathing, nonlabored ventilation and respiratory function stable Cardiovascular status: blood pressure returned to baseline Postop Assessment: no apparent nausea or vomiting Anesthetic complications: no     Last Vitals:  Vitals:   12/19/16 1050 12/19/16 1100  BP:  126/78  Resp: (!) 21 (!) 26  Temp:    SpO2: 100% 100%    Last Pain:  Vitals:   12/19/16 1010  TempSrc: Oral                 Luetta Piazza J

## 2016-12-19 NOTE — Transfer of Care (Signed)
Immediate Anesthesia Transfer of Care Note  Patient: Logan Price  Procedure(s) Performed: Procedure(s) with comments: ESOPHAGOGASTRODUODENOSCOPY (EGD) WITH PROPOFOL (N/A) - 11:30am SAVORY DILATION (N/A)  Patient Location: PACU  Anesthesia Type:MAC  Level of Consciousness: awake and patient cooperative  Airway & Oxygen Therapy: Patient Spontanous Breathing and Patient connected to face mask oxygen  Post-op Assessment: Report given to RN and Post -op Vital signs reviewed and stable  Post vital signs: Reviewed and stable  Last Vitals:  Vitals:   12/19/16 1050 12/19/16 1100  BP:  126/78  Resp: (!) 21 (!) 26  Temp:    SpO2: 100% 100%    Last Pain:  Vitals:   12/19/16 1010  TempSrc: Oral      Patients Stated Pain Goal: 7 (35/70/17 7939)  Complications: No apparent anesthesia complications

## 2016-12-19 NOTE — Anesthesia Preprocedure Evaluation (Addendum)
Anesthesia Evaluation  Patient identified by MRN, date of birth, ID band Patient awake    Reviewed: Allergy & Precautions, NPO status , Patient's Chart, lab work & pertinent test results  Airway Mallampati: II  TM Distance: >3 FB     Dental  (+) Teeth Intact, Dental Advisory Given   Pulmonary former smoker,    breath sounds clear to auscultation       Cardiovascular hypertension, Pt. on medications  Rhythm:Regular Rate:Normal     Neuro/Psych    GI/Hepatic GERD  ,(+) Hepatitis -, C  Endo/Other    Renal/GU      Musculoskeletal   Abdominal   Peds  Hematology   Anesthesia Other Findings   Reproductive/Obstetrics                             Anesthesia Physical Anesthesia Plan  ASA: II  Anesthesia Plan: MAC   Post-op Pain Management:    Induction: Intravenous  PONV Risk Score and Plan:   Airway Management Planned: Simple Face Mask  Additional Equipment:   Intra-op Plan:   Post-operative Plan:   Informed Consent: I have reviewed the patients History and Physical, chart, labs and discussed the procedure including the risks, benefits and alternatives for the proposed anesthesia with the patient or authorized representative who has indicated his/her understanding and acceptance.     Plan Discussed with:   Anesthesia Plan Comments:         Anesthesia Quick Evaluation

## 2016-12-19 NOTE — Op Note (Signed)
[facility] Patient Name: Logan Price Procedure Date: 12/19/2016 11:38 AM MRN: [id number] Date of Birth: Aug 24, 1945 Attending MD: Jonette Eva MD, MD CSN: [id number] Age: 71 Admit Type: Outpatient Procedure:                Upper GI endoscopy with COLD FORCEPS                            BIOPSY/ESOPHAGEAL DILATION Indications:              Dysphagia Providers:                Jonette Eva MD, MD, Judee Clara, RN, Edrick Kins, RN Referring MD:              Medicines:                Propofol per Anesthesia Complications:            No immediate complications. Estimated Blood Loss:     Estimated blood loss was minimal. Procedure:                Pre-Anesthesia Assessment:                           - Prior to the procedure, a History and Physical                            was performed, and patient medications and                            allergies were reviewed. The patient's tolerance of                            previous anesthesia was also reviewed. The risks                            and benefits of the procedure and the sedation                            options and risks were discussed with the patient.                            All questions were answered, and informed consent                            was obtained. Prior Anticoagulants: The patient has                            taken no previous anticoagulant or antiplatelet                            agents. ASA Grade Assessment: II - A patient with                            mild  systemic disease. After reviewing the risks                            and benefits, the patient was deemed in                            satisfactory condition to undergo the procedure.                            After obtaining informed consent, the endoscope was                            passed under direct vision. Throughout the                            procedure, the patient's blood pressure, pulse, and                             oxygen saturations were monitored continuously. The                            EG-[id number] ([id number]) scope was introduced through the                            mouth, and advanced to the second part of duodenum.                            The upper GI endoscopy was technically difficult                            and complex due to abnormal anatomy. The patient                            tolerated the procedure fairly well. Scope In: 12:18:46 PM Scope Out: 12:30:08 PM Total Procedure Duration: 0 hours 11 minutes 22 seconds  Findings:      -FRIABLE MUCOSA IN HYPOPHARYNX WITH SUPERFICIAL CHANGES OF       EDEMA/ERYTHEMA AND MACULOPAPULAR LESIONS.      A moderate Schatzki ring (acquired) was found at the gastroesophageal       junction. A guidewire was placed and the scope was withdrawn. Dilation       was performed with a Savary dilator with mild resistance at 15 mm, 16 mm       and 17 mm. Estimated blood loss was minimal.      Patchy mild inflammation characterized by congestion (edema) and       erythema was found in the gastric antrum. Biopsies were taken with a       cold forceps for Helicobacter pylori testing.      The examined duodenum was normal. Impression:               - DYSPHAGIA MOST LIKELY DUE TO UNKNOWN PROCESS IN                            HYPOPHARYNX AND LESS LIKELY Moderate Schatzki ring.  Dilated.                           - MILD Gastritis. Moderate Sedation:      Per Anesthesia Care Recommendation:           - Await pathology results.                           - High fiber diet and low fat diet.                           - Continue present medications.                           - Refer to an ENT specialist WITHIN 4 weeks.                           - Refer to a cardiologist at the next available                            appointment: Dec 22, 1338 WITH DR. Purvis Sheffield DUE                            TO ABNORMAL ECG..                            - Return to my office in 4 months.                           - Patient has a contact number available for                            emergencies. The signs and symptoms of potential                            delayed complications were discussed with the                            patient. Return to normal activities tomorrow.                            Written discharge instructions were provided to the                            patient. Procedure Code(s):        --- Professional ---                           [phone], Esophagogastroduodenoscopy, flexible,                            transoral; with insertion of guide wire followed by                            passage of dilator(s) through esophagus over guide  wire                           [id number], Esophagogastroduodenoscopy, flexible,                            transoral; with biopsy, single or multiple Diagnosis Code(s):        --- Professional ---                           K22.2, Esophageal obstruction                           K29.70, Gastritis, unspecified, without bleeding                           R13.10, Dysphagia, unspecified CPT copyright 2016 [employer]. All rights reserved. The codes documented in this report are preliminary and upon coder review may  be revised to meet current compliance requirements. Jonette Eva, MD Jonette Eva MD, MD 12/19/2016 12:48:59 PM This report has been signed electronically. Number of Addenda: 0

## 2016-12-19 NOTE — Discharge Instructions (Signed)
I dilated your esophagus. I SAW A NARROWING IN YOUR esophagus. You have a small hiatal hernia AND gastritis. I biopsied your stomach.   SEE ENT TO EVALUATE YOUR THROAT.  SEE CARDIOLOGY BECAUSE YOUR EKG WAS NOT NORMAL. YOUR APPT IS SEP 27 AT 140 PM WITH DR. Nile Dear. ARRIVE AT [place] PM [phone]).  CONTINUE PANTOPRAZOLE.  YOUR BIOPSY RESULTS WILL BE AVAILABLE IN MY CHART AFTER SEP 28 AND MY OFFICE WILL CONTACT YOU IN 10-14 DAYS WITH YOUR RESULTS.   FOLLOW UP IN 3 MOS.  UPPER ENDOSCOPY AFTER CARE Read the instructions outlined below and refer to this sheet in the next week. These discharge instructions provide you with general information on caring for yourself after you leave the hospital. While your treatment has been planned according to the most current medical practices available, unavoidable complications occasionally occur. If you have any problems or questions after discharge, call DR. Teleah Villamar, [phone].  ACTIVITY  You may resume your regular activity, but move at a slower pace for the next 24 hours.   Take frequent rest periods for the next 24 hours.   Walking will help get rid of the air and reduce the bloated feeling in your belly (abdomen).   No driving for 24 hours (because of the medicine (anesthesia) used during the test).   You may shower.   Do not sign any important legal documents or operate any machinery for 24 hours (because of the anesthesia used during the test).    NUTRITION  Drink plenty of fluids.   You may resume your normal diet as instructed by your doctor.   Begin with a light meal and progress to your normal diet. Heavy or fried foods are harder to digest and may make you feel sick to your stomach (nauseated).   Avoid alcoholic beverages for 24 hours or as instructed.    MEDICATIONS  You may resume your normal medications.   WHAT YOU CAN EXPECT TODAY  Some feelings of bloating in the abdomen.   Passage of more  gas than usual.    IF YOU HAD A BIOPSY TAKEN DURING THE UPPER ENDOSCOPY:  Eat a soft diet IF YOU HAVE NAUSEA, BLOATING, ABDOMINAL PAIN, OR VOMITING.    FINDING OUT THE RESULTS OF YOUR TEST Not all test results are available during your visit. DR. Oneida Alar WILL CALL YOU WITHIN 7 DAYS OF YOUR PROCEDUE WITH YOUR RESULTS. Do not assume everything is normal if you have not heard from DR. Daisja Kessinger IN ONE WEEK, CALL HER OFFICE AT [phone].  SEEK IMMEDIATE MEDICAL ATTENTION AND CALL THE OFFICE: [phone] IF:  You have more than a spotting of blood in your stool.   Your belly is swollen (abdominal distention).   You are nauseated or vomiting.   You have a temperature over 101F.   You have abdominal pain or discomfort that is severe or gets worse throughout the day.  Gastritis  Gastritis is an inflammation (the body's way of reacting to injury and/or infection) of the stomach. It is often caused by viral or bacterial (germ) infections. It can also be caused BY ASPIRIN, BC/GOODY POWDER'S, (IBUPROFEN) MOTRIN, OR ALEVE (NAPROXEN), chemicals (including alcohol), SPICY FOODS, and medications. This illness may be associated with generalized malaise (feeling tired, not well), UPPER ABDOMINAL STOMACH cramps, and fever. One common bacterial cause of gastritis is an organism known as H. Pylori. This can be treated with antibiotics.   Hiatal Hernia A hiatal hernia occurs when a part of  the stomach slides above the diaphragm. The diaphragm is the thin muscle separating the belly (abdomen) from the chest. A hiatal hernia can be something you are born with or develop over time. Hiatal hernias may allow stomach acid to flow back into your esophagus, the tube which carries food from your mouth to your stomach. If this acid causes problems it is called GERD (gastro-esophageal reflux disease).   SYMPTOMS Common symptoms of GERD are heartburn (burning in your chest). This is worse when lying down or bending  over. It may also cause belching and indigestion. Some of the things which make GERD worse are:  Increased weight pushes on stomach making acid rise more easily.   Smoking markedly increases acid production.   Alcohol decreases lower esophageal sphincter pressure (valve between stomach and esophagus), allowing acid from stomach into esophagus.   Late evening meals and going to bed with a full stomach increases pressure.   HOME CARE INSTRUCTIONS  Try to achieve and maintain an ideal body weight.   Avoid drinking alcoholic beverages.   DO NOT smokE.   Do not wear tight clothing around your chest or stomach.   Eat smaller meals and eat more frequently. This keeps your stomach from getting too full. Eat slowly.   Do not lie down for 2 or 3 hours after eating. Do not eat or drink anything 1 to 2 hours before going to bed.   Avoid caffeine beverages (colas, coffee, cocoa, tea), fatty foods, citrus fruits and all other foods and drinks that contain acid and that seem to increase the problems.   Avoid bending over, especially after eating OR STRAINING. Anything that increases the pressure in your belly increases the amount of acid that may be pushed up into your esophagus.    DYSPHAGIA DYSPHAGIA can be caused by stomach acid backing up into the tube that carries food from the mouth down to the stomach (lower esophagus).  TREATMENT There are a number of medicines used to treat DYSPHAGIA including: Antacids.  Proton-pump inhibitors: PANTOPRAZOLE  HOME CARE INSTRUCTIONS Eat 2-3 hours before going to bed.  Try to reach and maintain a healthy weight.  Do not eat just a few very large meals. Instead, eat 4 TO 6 smaller meals throughout the day.  Try to identify foods and beverages that make your symptoms worse, and avoid these.  Avoid tight clothing.  Do not exercise right after eating.

## 2016-12-19 NOTE — H&P (Signed)
Primary Care Physician:  Sharion Balloon, FNP Primary Gastroenterologist:  Dr. Oneida Alar  Pre-Procedure History & Physical: HPI:  Logan Price is a 71 y.o. male here for DYSPHAGIA.  Past Medical History:  Diagnosis Date  . Diverticulitis   . 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 GERD (gastroesophageal reflux disease)   . Hepatitis C   . HTN (hypertension)   . Hyperlipidemia     Past Surgical History:  Procedure Laterality Date  . BIOPSY  05/04/2015   Procedure: BIOPSY;  Surgeon: Danie Binder, MD;  Location: [facility];  Service: Endoscopy;;  bx's of ileocecal valve   . COLONOSCOPY WITH PROPOFOL N/A 05/04/2015   Dr. Oneida Alar: normal appearing ileum with prominent IC valve with tubular adenomas, moderate diverticulosis in sigmoid colon, ascending colon, and retum. Moderate sized internal hemorrhoids. Surveillance in 5 years  . FLEXIBLE SIGMOIDOSCOPY N/A 12/10/2015   hemorrhoid banding X 3   . HEMORRHOID BANDING N/A 12/10/2015   Procedure: HEMORRHOID BANDING;  Surgeon: Danie Binder, MD;  Location: [facility];  Service: Endoscopy;  Laterality: N/A;  1:30 PM  . None to date     As of 04/14/15  . POLYPECTOMY  05/04/2015   Procedure: POLYPECTOMY;  Surgeon: Danie Binder, MD;  Location: [facility];  Service: Endoscopy;;  descending colon polyp, ascending colon polyp    Prior to Admission medications   Medication Sig Start Date End Date Taking? Authorizing Provider  amLODipine (NORVASC) 10 MG tablet Take 1 tablet (10 mg total) by mouth daily. 04/21/16  Yes Hawks, Christy A, FNP  Cholecalciferol (VITAMIN D) 2000 units CAPS Take 2,000 Units by mouth daily.   Yes [provider]  fluticasone (FLONASE) 50 MCG/ACT nasal spray PLACE 2 SPRAYS INTO BOTH NOSTRILS DAILY. Patient taking differently: Place 2 sprays into both nostrils at bedtime.  11/06/16  Yes Hawks, Christy A, FNP  Multiple Vitamin (MULTIVITAMIN WITH MINERALS) TABS tablet Take 1 tablet by mouth  daily.   Yes [provider]  pantoprazole (PROTONIX) 20 MG tablet TAKE 1 TABLET (20 MG TOTAL) BY MOUTH 2 (TWO) TIMES DAILY. 11/07/16  Yes Evelina Dun A, FNP  simvastatin (ZOCOR) 20 MG tablet TAKE 1 TABLET AT BEDTIME 07/21/16  Yes Hawks, Christy A, FNP  potassium chloride (K-DUR) 10 MEQ tablet Take 2 tablets (20 mEq total) by mouth 2 (two) times daily. 12/15/16 12/18/16  Carlis Stable, NP    Allergies as of 12/12/2016  . (No Known Allergies)    Family History  Problem Relation Age of Onset  . Cancer Mother   . Hypertension Father   . Hypertension Sister   . Hypertension Brother   . Hypertension Sister   . Stroke Brother        hemorrhagic  . Colon cancer Neg Hx     Social History   Social History  . Marital status: Married    Spouse name: N/A  . Number of children: N/A  . Years of education: N/A   Occupational History  . Not on file.   Social History Main Topics  . Smoking status: Former Smoker    Packs/day: 1.00    Years: 40.00    Types: Cigarettes    Quit date: 10/09/2006  . Smokeless tobacco: Never Used     Comment: quit x 9 years  . Alcohol use 0.0 oz/week     Comment: 7 oz of vodka per night  . Drug use: No  . Sexual activity:  Yes    Birth control/ protection: None   Other Topics Concern  . Not on file   Social History Narrative  . No narrative on file    Review of Systems: See HPI, otherwise negative ROS   Physical Exam: BP 126/78   Temp 97.8 F (36.6 C) (Oral)   Resp (!) 26   SpO2 100%  General:   Alert,  pleasant and cooperative in NAD Head:  Normocephalic and atraumatic. Neck:  Supple; Lungs:  Clear throughout to auscultation.    Heart:  Regular rate and rhythm. Abdomen:  Soft, nontender and nondistended. Normal bowel sounds, without guarding, and without rebound.   Neurologic:  Alert and  oriented x4;  grossly normal neurologically.  Impression/Plan:     DYSPHAGIA  PLAN:  EGD/DIL TODAY DISCUSSED PROCEDURE, BENEFITS, &  RISKS: < 1% chance of medication reaction, bleeding, or perforation.

## 2016-12-19 NOTE — Telephone Encounter (Signed)
ENT referral has been made

## 2016-12-19 NOTE — Telephone Encounter (Signed)
PT NEEDS NPV WITH ENT ASAP DUE TO GLOBUS SENSATION/POSSIBLE LESION IN HYPOPHARYNX.   REFERRAL TO CARDIOLOGY FOR ABNORMAL ECG. PT ALREADY HAS AN APPT.

## 2016-12-21 ENCOUNTER — Ambulatory Visit: Payer: Medicare Other | Admitting: Cardiovascular Disease

## 2016-12-21 ENCOUNTER — Ambulatory Visit (HOSPITAL_COMMUNITY): Admission: RE | Admit: 2016-12-21 | Payer: Medicare Other | Source: Ambulatory Visit

## 2016-12-22 ENCOUNTER — Encounter (HOSPITAL_COMMUNITY): Payer: Self-pay | Admitting: Gastroenterology

## 2016-12-25 ENCOUNTER — Ambulatory Visit (HOSPITAL_COMMUNITY)
Admission: RE | Admit: 2016-12-25 | Discharge: 2016-12-25 | Disposition: A | Discharge status: Home / Self Care | Payer: Medicare Other | Source: Ambulatory Visit | Attending: Internal Medicine | Admitting: Internal Medicine

## 2016-12-25 DIAGNOSIS — I700 Atherosclerosis of aorta: Secondary | ICD-10-CM | POA: Diagnosis not present

## 2016-12-25 DIAGNOSIS — B182 Chronic viral hepatitis C: Secondary | ICD-10-CM | POA: Diagnosis not present

## 2016-12-25 DIAGNOSIS — N281 Cyst of kidney, acquired: Secondary | ICD-10-CM | POA: Insufficient documentation

## 2016-12-25 DIAGNOSIS — B1920 Unspecified viral hepatitis C without hepatic coma: Secondary | ICD-10-CM | POA: Diagnosis not present

## 2016-12-25 DIAGNOSIS — N200 Calculus of kidney: Secondary | ICD-10-CM | POA: Diagnosis not present

## 2016-12-27 ENCOUNTER — Telehealth: Payer: Self-pay | Admitting: Gastroenterology

## 2016-12-27 ENCOUNTER — Encounter: Payer: Self-pay | Admitting: Gastroenterology

## 2016-12-27 ENCOUNTER — Other Ambulatory Visit: Payer: Self-pay

## 2016-12-27 DIAGNOSIS — R1310 Dysphagia, unspecified: Secondary | ICD-10-CM

## 2016-12-27 DIAGNOSIS — R9431 Abnormal electrocardiogram [ECG] [EKG]: Secondary | ICD-10-CM

## 2016-12-27 NOTE — Telephone Encounter (Signed)
[phone]  PATIENT CALLED AND STATED HE WAS RETURNING A CALL

## 2016-12-27 NOTE — Progress Notes (Signed)
PATIENT SCHEDULED  °

## 2016-12-27 NOTE — Progress Notes (Signed)
LMOM to call.

## 2016-12-27 NOTE — Telephone Encounter (Signed)
See result note. Pt is aware of results.

## 2016-12-27 NOTE — Progress Notes (Signed)
PT is aware.

## 2017-01-02 ENCOUNTER — Ambulatory Visit (INDEPENDENT_AMBULATORY_CARE_PROVIDER_SITE_OTHER): Payer: Medicare Other | Admitting: Cardiovascular Disease

## 2017-01-02 ENCOUNTER — Telehealth: Payer: Self-pay

## 2017-01-02 ENCOUNTER — Encounter: Payer: Self-pay | Admitting: Cardiovascular Disease

## 2017-01-02 VITALS — BP 110/62 | HR 70 | Ht 69.0 in | Wt 157.0 lb

## 2017-01-02 DIAGNOSIS — Z01818 Encounter for other preprocedural examination: Secondary | ICD-10-CM

## 2017-01-02 DIAGNOSIS — I10 Essential (primary) hypertension: Secondary | ICD-10-CM | POA: Diagnosis not present

## 2017-01-02 DIAGNOSIS — E7800 Pure hypercholesterolemia, unspecified: Secondary | ICD-10-CM | POA: Diagnosis not present

## 2017-01-02 DIAGNOSIS — R9431 Abnormal electrocardiogram [ECG] [EKG]: Secondary | ICD-10-CM | POA: Diagnosis not present

## 2017-01-02 DIAGNOSIS — R079 Chest pain, unspecified: Secondary | ICD-10-CM

## 2017-01-02 DIAGNOSIS — R42 Dizziness and giddiness: Secondary | ICD-10-CM | POA: Diagnosis not present

## 2017-01-02 NOTE — Progress Notes (Signed)
CARDIOLOGY CONSULT NOTE  Patient ID: MCCARTNEY MONTOUR MRN: [id number] DOB/AGE: 29-Aug-1945 71 y.o.  Admit date: (Not on file) Primary Physician: [name], FNP Referring Physician: Dr. Jonette Eva  Reason for Consultation: Abnormal ECG, preoperative risk stratification  HPI: [facility] is a 71 y.o. male who is being seen today for the evaluation of an abnormal ECG and preoperative risk stratification at the request of Dr. Jonette Eva.  He has a history of hypertension and hyperlipidemia.  He was evaluated for dysphagia by Dr. Darrick Penna.  EGD demonstrated friable mucosa in the hypopharynx with maculopapular lesions.  Dr. Darrick Penna notes that the patient requires an evaluation by ENT for this.  ECG performed on 12/15/16 demonstrated sinus rhythm with first-degree AV block, PR interval 222 ms, with possible old anteroseptal infarct.  About one month ago while pushing mowing his lawn, he experienced dizziness and then experienced about 4 hours chest pain across the bottom of his chest. He denies associated shortness of breath, nausea, vomiting, and palpitations.  He denies a history of exertional dyspnea, leg swelling, orthopnea, and paroxysmal nocturnal dyspnea.  He does not exercise.  He quit smoking in 2008. He used to smoke a pack of cigarettes daily for 44 years.  There is been no change in his energy levels over the past several months.    No Known Allergies  Current Outpatient Prescriptions  Medication Sig Dispense Refill  . amLODipine (NORVASC) 10 MG tablet Take 1 tablet (10 mg total) by mouth daily. 90 tablet 1  . Cholecalciferol (VITAMIN D) 2000 units CAPS Take 2,000 Units by mouth daily.    . fluticasone (FLONASE) 50 MCG/ACT nasal spray PLACE 2 SPRAYS INTO BOTH NOSTRILS DAILY. (Patient taking differently: Place 2 sprays into both nostrils at bedtime. ) 16 g 5  . Multiple Vitamin (MULTIVITAMIN WITH MINERALS) TABS tablet Take 1 tablet by mouth daily.    .  pantoprazole (PROTONIX) 20 MG tablet TAKE 1 TABLET (20 MG TOTAL) BY MOUTH 2 (TWO) TIMES DAILY. 180 tablet 0  . simvastatin (ZOCOR) 20 MG tablet TAKE 1 TABLET AT BEDTIME 90 tablet 1   No current facility-administered medications for this visit.     Past Medical History:  Diagnosis Date  . Diverticulitis   . 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 GERD (gastroesophageal reflux disease)   . Hepatitis C   . HTN (hypertension)   . Hyperlipidemia     Past Surgical History:  Procedure Laterality Date  . BIOPSY  05/04/2015   Procedure: BIOPSY;  Surgeon: West Bali, MD;  [facility];  Service: Endoscopy;;  bx's of ileocecal valve   . COLONOSCOPY WITH PROPOFOL N/A 05/04/2015   Dr. Darrick Penna: normal appearing ileum with prominent IC valve with tubular adenomas, moderate diverticulosis in sigmoid colon, ascending colon, and retum. Moderate sized internal hemorrhoids. Surveillance in 5 years  . ESOPHAGOGASTRODUODENOSCOPY (EGD) WITH PROPOFOL N/A 12/19/2016   Procedure: ESOPHAGOGASTRODUODENOSCOPY (EGD) WITH PROPOFOL;  Surgeon: West Bali, MD;  [facility];  Service: Endoscopy;  Laterality: N/A;  11:30am  . FLEXIBLE SIGMOIDOSCOPY N/A 12/10/2015   hemorrhoid banding X 3   . HEMORRHOID BANDING N/A 12/10/2015   Procedure: HEMORRHOID BANDING;  Surgeon: West Bali, MD;  [facility]: [facility];  Service: Endoscopy;  Laterality: N/A;  1:30 PM  . None to date     As of 04/14/15  . POLYPECTOMY  05/04/2015   Procedure: POLYPECTOMY;  Surgeon:  West Bali, MD;  [facility];  Service: Endoscopy;;  descending colon polyp, ascending colon polyp  . SAVORY DILATION N/A 12/19/2016   Procedure: SAVORY DILATION;  Surgeon: West Bali, MD;  [facility]: [facility];  Service: Endoscopy;  Laterality: N/A;    Social History   Social History  . Marital status: Married    Spouse name: N/A  . Number of children: N/A  . Years of education: N/A    Occupational History  . Not on file.   Social History Main Topics  . Smoking status: Former Smoker    Packs/day: 1.00    Years: 40.00    Types: Cigarettes    Quit date: 10/09/2006  . Smokeless tobacco: Never Used     Comment: quit x 9 years  . Alcohol use 0.0 oz/week     Comment: 7 oz of vodka per night  . Drug use: No  . Sexual activity: Yes    Birth control/ protection: None   Other Topics Concern  . Not on file   Social History Narrative  . No narrative on file     No family history of premature CAD in 1st degree relatives.  Current Meds  Medication Sig  . amLODipine (NORVASC) 10 MG tablet Take 1 tablet (10 mg total) by mouth daily.  . Cholecalciferol (VITAMIN D) 2000 units CAPS Take 2,000 Units by mouth daily.  . fluticasone (FLONASE) 50 MCG/ACT nasal spray PLACE 2 SPRAYS INTO BOTH NOSTRILS DAILY. (Patient taking differently: Place 2 sprays into both nostrils at bedtime. )  . Multiple Vitamin (MULTIVITAMIN WITH MINERALS) TABS tablet Take 1 tablet by mouth daily.  . pantoprazole (PROTONIX) 20 MG tablet TAKE 1 TABLET (20 MG TOTAL) BY MOUTH 2 (TWO) TIMES DAILY.  . simvastatin (ZOCOR) 20 MG tablet TAKE 1 TABLET AT BEDTIME      Review of systems complete and found to be negative unless listed above in HPI    Physical exam Blood pressure 110/62, pulse 70, height 5\' 9"  (1.753 m), weight 157 lb (71.2 kg), SpO2 97 %. General: NAD Neck: No JVD, no thyromegaly or thyroid nodule.  Lungs: Diminished throughout, no crackles or wheezes. CV: Nondisplaced PMI. Regular rate and rhythm, normal S1/S2, no S3/S4, no murmur.  No peripheral edema.  No carotid bruit.    Abdomen: Soft, nontender, no distention.  Skin: Intact without lesions or rashes.  Neurologic: Alert and oriented x 3.  Psych: Normal affect. Extremities: No clubbing or cyanosis.  HEENT: Normal.   ECG: Most recent ECG reviewed.   Labs: Lab Results  Component Value Date/Time   K 4.1 12/19/2016 10:27 AM    BUN 7 12/15/2016 02:40 PM   BUN 11 10/20/2016 09:20 AM   CREATININE 0.99 12/15/2016 02:40 PM   CREATININE 1.08 10/08/2012 10:18 AM   ALT 13 12/13/2016 02:24 PM   HGB 13.9 12/19/2016 10:27 AM     Lipids: Lab Results  Component Value Date/Time   LDLCALC 47 10/20/2016 09:20 AM   LDLCALC 99 10/08/2012 10:18 AM   CHOL 143 10/20/2016 09:20 AM   CHOL 179 10/08/2012 10:18 AM   TRIG 200 (H) 10/20/2016 09:20 AM   TRIG 60 10/08/2012 10:18 AM   HDL 56 10/20/2016 09:20 AM   HDL 68 10/08/2012 10:18 AM        ASSESSMENT AND PLAN:  1. Abnormal ECG with chest pain and dizziness: He has both typical and atypical symptoms for heart disease. Risk factors include hypertension, hyperlipidemia, and a  history of tobacco abuse. I will proceed with a nuclear myocardial perfusion imaging study to evaluate for ischemic heart disease (exercise Myoview).  2. Preoperative risk stratification: I will obtain an exercise Myoview stress test for more accurate risk stratification.  3. Hypertension: Controlled. No changes to therapy.   Disposition: Follow up in 3 weeks.   Signed: Prentice Docker, M.D., F.A.C.C.  01/02/2017, 8:50 AM

## 2017-01-02 NOTE — Telephone Encounter (Signed)
Called Dr. Deeann Saint (ENT) office to f/u on referral that was faxed 12/27/16. Called to f/u on referral this morning. Their office called and LM for pt, they're awaiting return call from pt.

## 2017-01-02 NOTE — Patient Instructions (Addendum)
Your physician recommends that you schedule a follow-up appointment in: a few weeks with [facility] physician has requested that you have en exercise stress myoview. For further information please visit [email or web address]. Please follow instruction sheet, as given.      Your physician recommends that you continue on your current medications as directed. Please refer to the Current Medication list given to you today.      Thank you for choosing [facility] !

## 2017-01-08 ENCOUNTER — Encounter (HOSPITAL_COMMUNITY): Payer: Self-pay

## 2017-01-08 ENCOUNTER — Ambulatory Visit (HOSPITAL_COMMUNITY)
Admission: RE | Admit: 2017-01-08 | Discharge: 2017-01-08 | Disposition: A | Discharge status: Home / Self Care | Payer: Medicare Other | Source: Ambulatory Visit | Attending: Family | Admitting: Family

## 2017-01-08 ENCOUNTER — Ambulatory Visit (HOSPITAL_BASED_OUTPATIENT_CLINIC_OR_DEPARTMENT_OTHER)
Admission: RE | Admit: 2017-01-08 | Discharge: 2017-01-08 | Disposition: A | Discharge status: Home / Self Care | Payer: Medicare Other | Source: Ambulatory Visit | Attending: Cardiovascular Disease | Admitting: Cardiovascular Disease

## 2017-01-08 DIAGNOSIS — R9431 Abnormal electrocardiogram [ECG] [EKG]: Secondary | ICD-10-CM

## 2017-01-08 DIAGNOSIS — R079 Chest pain, unspecified: Secondary | ICD-10-CM

## 2017-01-08 LAB — NM MYOCAR MULTI W/SPECT W/WALL MOTION / EF
CHL CUP MPHR: 151 {beats}/min
CHL CUP NUCLEAR SDS: 2
CHL CUP NUCLEAR SRS: 5
CHL CUP NUCLEAR SSS: 7
CSEPEW: 5.1 METS
CSEPPHR: 125 {beats}/min
Exercise duration (min): 3 min
Exercise duration (sec): 13 s
LHR: 0.29
LV dias vol: 81 mL (ref 62–150)
LV sys vol: 34 mL
NUC STRESS TID: 1.07
Percent HR: 82 %
Rest HR: 75 {beats}/min

## 2017-01-08 MED ORDER — TECHNETIUM TC 99M TETROFOSMIN IV KIT
30.0000 | PACK | Freq: Once | INTRAVENOUS | Status: AC | PRN
  Administered 2017-01-08: 33 via INTRAVENOUS

## 2017-01-08 MED ORDER — SODIUM CHLORIDE 0.9% FLUSH
INTRAVENOUS | Status: AC
  Administered 2017-01-08: 10 mL via INTRAVENOUS
  Filled 2017-01-08: qty 10

## 2017-01-08 MED ORDER — REGADENOSON 0.4 MG/5ML IV SOLN
INTRAVENOUS | Status: AC
  Administered 2017-01-08: 0.4 mg via INTRAVENOUS
  Filled 2017-01-08: qty 5

## 2017-01-08 MED ORDER — TECHNETIUM TC 99M TETROFOSMIN IV KIT
10.0000 | PACK | Freq: Once | INTRAVENOUS | Status: AC | PRN
  Administered 2017-01-08: 11 via INTRAVENOUS

## 2017-01-09 NOTE — Telephone Encounter (Signed)
Called ENT and they were awaiting for the patient to call them back. No appointment has been scheduled yet. I called patient and he states he did not receive a message from them. I gave him the phone # to call them.

## 2017-01-11 NOTE — Telephone Encounter (Signed)
Called Dr. Deeann Saint office. Pt has appt scheduled for 01/22/17 at 2:30pm.

## 2017-01-22 ENCOUNTER — Ambulatory Visit (INDEPENDENT_AMBULATORY_CARE_PROVIDER_SITE_OTHER): Payer: Medicare Other | Admitting: Otolaryngology

## 2017-01-22 DIAGNOSIS — R070 Pain in throat: Secondary | ICD-10-CM | POA: Diagnosis not present

## 2017-01-22 DIAGNOSIS — K219 Gastro-esophageal reflux disease without esophagitis: Secondary | ICD-10-CM | POA: Diagnosis not present

## 2017-01-22 DIAGNOSIS — R05 Cough: Secondary | ICD-10-CM

## 2017-01-26 ENCOUNTER — Ambulatory Visit (INDEPENDENT_AMBULATORY_CARE_PROVIDER_SITE_OTHER): Payer: Medicare Other | Admitting: Cardiovascular Disease

## 2017-01-26 ENCOUNTER — Encounter: Payer: Self-pay | Admitting: Cardiovascular Disease

## 2017-01-26 VITALS — BP 118/78 | HR 88 | Ht 69.0 in | Wt 159.0 lb

## 2017-01-26 DIAGNOSIS — E7800 Pure hypercholesterolemia, unspecified: Secondary | ICD-10-CM

## 2017-01-26 DIAGNOSIS — I10 Essential (primary) hypertension: Secondary | ICD-10-CM | POA: Diagnosis not present

## 2017-01-26 DIAGNOSIS — R079 Chest pain, unspecified: Secondary | ICD-10-CM

## 2017-01-26 DIAGNOSIS — R9431 Abnormal electrocardiogram [ECG] [EKG]: Secondary | ICD-10-CM | POA: Diagnosis not present

## 2017-01-26 DIAGNOSIS — R42 Dizziness and giddiness: Secondary | ICD-10-CM

## 2017-01-26 DIAGNOSIS — Z01818 Encounter for other preprocedural examination: Secondary | ICD-10-CM | POA: Diagnosis not present

## 2017-01-26 NOTE — Progress Notes (Signed)
SUBJECTIVE: The patient returns for follow-up after undergoing cardiovascular testing performed for the evaluation of chest pain, abnormal ECG, and dizziness.  Nuclear stress testing was low risk on 01/08/17.  LVEF was normal at 58%.  There was a hypertensive response to exercise initially.  There was a small defect in the mid to apical inferior region suggestive of scar with mild peri-infarct ischemia although wall motion was normal on gated imaging, suggestive of normal myocardial perfusion.  He is feeling well and denies chest pain and shortness of breath.  He saw ENT and was prescribed Zantac.  He has a cough which primarily bothers him at night between 9 and 10 PM and subsides by 5 AM.   Review of Systems: As per "subjective", otherwise negative.  No Known Allergies  Current Outpatient Prescriptions  Medication Sig Dispense Refill  . amLODipine (NORVASC) 10 MG tablet Take 1 tablet (10 mg total) by mouth daily. 90 tablet 1  . Cholecalciferol (VITAMIN D) 2000 units CAPS Take 2,000 Units by mouth daily.    . fluticasone (FLONASE) 50 MCG/ACT nasal spray PLACE 2 SPRAYS INTO BOTH NOSTRILS DAILY. (Patient taking differently: Place 2 sprays into both nostrils at bedtime. ) 16 g 5  . Multiple Vitamin (MULTIVITAMIN WITH MINERALS) TABS tablet Take 1 tablet by mouth daily.    . pantoprazole (PROTONIX) 20 MG tablet TAKE 1 TABLET (20 MG TOTAL) BY MOUTH 2 (TWO) TIMES DAILY. 180 tablet 0  . ranitidine (ZANTAC) 150 MG tablet TAKE 1 TABELT AT BEDTIME  12  . simvastatin (ZOCOR) 20 MG tablet TAKE 1 TABLET AT BEDTIME 90 tablet 1   No current facility-administered medications for this visit.     Past Medical History:  Diagnosis Date  . Diverticulitis   . 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 GERD (gastroesophageal reflux disease)   . Hepatitis C   . HTN (hypertension)   . Hyperlipidemia     Past Surgical History:  Procedure Laterality Date  . BIOPSY  05/04/2015   Procedure: BIOPSY;  Surgeon: Danie Binder, MD;  [facility];  Service: Endoscopy;;  bx's of ileocecal valve   . COLONOSCOPY WITH PROPOFOL N/A 05/04/2015   Dr. Oneida Alar: normal appearing ileum with prominent IC valve with tubular adenomas, moderate diverticulosis in sigmoid colon, ascending colon, and retum. Moderate sized internal hemorrhoids. Surveillance in 5 years  . ESOPHAGOGASTRODUODENOSCOPY (EGD) WITH PROPOFOL N/A 12/19/2016   Procedure: ESOPHAGOGASTRODUODENOSCOPY (EGD) WITH PROPOFOL;  Surgeon: Danie Binder, MD;  [facility];  Service: Endoscopy;  Laterality: N/A;  11:30am  . FLEXIBLE SIGMOIDOSCOPY N/A 12/10/2015   hemorrhoid banding X 3   . HEMORRHOID BANDING N/A 12/10/2015   Procedure: HEMORRHOID BANDING;  Surgeon: Danie Binder, MD;  [facility];  Service: Endoscopy;  Laterality: N/A;  1:30 PM  . None to date     As of 04/14/15  . POLYPECTOMY  05/04/2015   Procedure: POLYPECTOMY;  Surgeon: Danie Binder, MD;  [facility];  Service: Endoscopy;;  descending colon polyp, ascending colon polyp  . SAVORY DILATION N/A 12/19/2016   Procedure: SAVORY DILATION;  Surgeon: Danie Binder, MD;  [facility];  Service: Endoscopy;  Laterality: N/A;    Social History   Social History  . Marital status: Married    Spouse name: N/A  . Number of children: N/A  . Years of education: N/A   Occupational History  . Not on file.  Social History Main Topics  . Smoking status: Former Smoker    Packs/day: 1.00    Years: 40.00    Types: Cigarettes    Quit date: 10/09/2006  . Smokeless tobacco: Never Used     Comment: quit x 9 years  . Alcohol use 0.0 oz/week     Comment: 7 oz of vodka per night  . Drug use: No  . Sexual activity: Yes    Birth control/ protection: None   Other Topics Concern  . Not on file   Social History Narrative  . No narrative on file     Vitals:   01/26/17 1144  BP: 118/78  Pulse: 88  SpO2: 96%    Weight: 159 lb (72.1 kg)  Height: 5\' 9"  (1.753 m)    Wt Readings from Last 3 Encounters:  01/26/17 159 lb (72.1 kg)  01/02/17 157 lb (71.2 kg)  12/15/16 156 lb (70.8 kg)     PHYSICAL EXAM General: NAD HEENT: Normal. Neck: No JVD, no thyromegaly. Lungs: Clear to auscultation bilaterally with normal respiratory effort. CV: Regular rate and rhythm, normal S1/S2, no S3/S4, no murmur. No pretibial or periankle edema.   Abdomen: Soft, nontender, no distention.  Neurologic: Alert and oriented.  Psych: Normal affect. Skin: Normal. Musculoskeletal: No gross deformities.    ECG: Most recent ECG reviewed.   Labs: Lab Results  Component Value Date/Time   K 4.1 12/19/2016 10:27 AM   BUN 7 12/15/2016 02:40 PM   BUN 11 10/20/2016 09:20 AM   CREATININE 0.99 12/15/2016 02:40 PM   CREATININE 1.08 10/08/2012 10:18 AM   ALT 13 12/13/2016 02:24 PM   HGB 13.9 12/19/2016 10:27 AM     Lipids: Lab Results  Component Value Date/Time   LDLCALC 47 10/20/2016 09:20 AM   LDLCALC 99 10/08/2012 10:18 AM   CHOL 143 10/20/2016 09:20 AM   CHOL 179 10/08/2012 10:18 AM   TRIG 200 (H) 10/20/2016 09:20 AM   TRIG 60 10/08/2012 10:18 AM   HDL 56 10/20/2016 09:20 AM   HDL 68 10/08/2012 10:18 AM       ASSESSMENT AND PLAN:  1. Abnormal ECG with chest pain and dizziness: He has both typical and atypical symptoms for heart disease. Risk factors include hypertension, hyperlipidemia, and a history of tobacco abuse. Nuclear stress testing was low risk.  I suspect that myocardial perfusion imaging was essentially normal when corrected for artifact and regional wall motion.  No further testing is indicated.  2. Preoperative risk stratification: The patient is at a low risk for major adverse cardiac events in the perioperative period.  3. Hypertension: Controlled. No changes to therapy.      Disposition: Follow up as needed   Kate Sable, M.D., F.A.C.C.

## 2017-01-26 NOTE — Patient Instructions (Signed)
Medication Instructions:  Your physician recommends that you continue on your current medications as directed. Please refer to the Current Medication list given to you today.   Labwork: NONE   Testing/Procedures: NONE   Follow-Up: Your physician recommends that you schedule a follow-up appointment in: As Needed    Any Other Special Instructions Will Be Listed Below (If Applicable).     If you need a refill on your cardiac medications before your next appointment, please call your pharmacy.  Thank you for choosing [facility]!

## 2017-01-30 ENCOUNTER — Other Ambulatory Visit: Payer: Self-pay | Admitting: *Deleted

## 2017-01-30 DIAGNOSIS — I10 Essential (primary) hypertension: Secondary | ICD-10-CM

## 2017-01-30 DIAGNOSIS — E785 Hyperlipidemia, unspecified: Secondary | ICD-10-CM

## 2017-01-30 MED ORDER — AMLODIPINE BESYLATE 10 MG PO TABS: 10.0000 mg | ORAL_TABLET | Freq: Every day | ORAL | 1 refills | Status: DC

## 2017-01-30 MED ORDER — SIMVASTATIN 20 MG PO TABS: 20.0000 mg | ORAL_TABLET | Freq: Every day | ORAL | 1 refills | Status: DC

## 2017-01-30 NOTE — Telephone Encounter (Signed)
Print refills for amlodipine & simvastatin to mail to [facility]

## 2017-02-02 ENCOUNTER — Other Ambulatory Visit: Payer: Self-pay | Admitting: Family

## 2017-02-02 DIAGNOSIS — K219 Gastro-esophageal reflux disease without esophagitis: Secondary | ICD-10-CM

## 2017-02-05 ENCOUNTER — Other Ambulatory Visit: Payer: Self-pay | Admitting: Family

## 2017-02-05 DIAGNOSIS — E785 Hyperlipidemia, unspecified: Secondary | ICD-10-CM

## 2017-02-05 DIAGNOSIS — I10 Essential (primary) hypertension: Secondary | ICD-10-CM

## 2017-03-14 ENCOUNTER — Other Ambulatory Visit: Payer: Self-pay | Admitting: Gastroenterology

## 2017-03-22 ENCOUNTER — Ambulatory Visit (INDEPENDENT_AMBULATORY_CARE_PROVIDER_SITE_OTHER): Payer: Medicare Other | Admitting: Otolaryngology

## 2017-03-22 DIAGNOSIS — K219 Gastro-esophageal reflux disease without esophagitis: Secondary | ICD-10-CM | POA: Diagnosis not present

## 2017-03-29 ENCOUNTER — Encounter: Payer: Self-pay | Admitting: Gastroenterology

## 2017-03-29 ENCOUNTER — Ambulatory Visit (INDEPENDENT_AMBULATORY_CARE_PROVIDER_SITE_OTHER): Payer: Medicare Other | Admitting: Gastroenterology

## 2017-03-29 VITALS — BP 125/77 | HR 88 | Temp 96.9°F | Ht 69.0 in | Wt 157.4 lb

## 2017-03-29 DIAGNOSIS — K2970 Gastritis, unspecified, without bleeding: Secondary | ICD-10-CM

## 2017-03-29 DIAGNOSIS — K219 Gastro-esophageal reflux disease without esophagitis: Secondary | ICD-10-CM

## 2017-03-29 NOTE — Assessment & Plan Note (Signed)
Clinically doing well.  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  Started on nighttime Zantac.  Continue follow-up with ENT.  Reinforced antireflux measures/food choices.  Continue Protonix 20 mg twice daily.  Call with any problems in the interim otherwise see back 6 months, appointment with Dr. Oneida Alar next time.

## 2017-03-29 NOTE — Progress Notes (Signed)
Primary Care Physician: Sharion Balloon, FNP  Primary Gastroenterologist:  Barney Drain, MD   Chief Complaint  Patient presents with  . Gastroesophageal Reflux    HPI: [facility] is a 72 y.o. male here for follow-up.  He was seen back in September for sensation of lump in the throat.  Symptoms occurring for greater than 9 months, intermittent.  Wakes up in the middle the night with it, causes urge to clear her throat or cough.  Trial of fluticasone with no relief.  Unremarkable thyroid ultrasound performed.  EGD September 2018 by Dr. Oneida Alar.-FRIABLE MUCOSA IN HYPOPHARYNX WITH SUPERFICIAL CHANGES OF EDEMA/ERYTHEMA AND MACULOPAPULAR LESIONS.  Moderate Schatzki ring found as well.  Dilated.  Patchy mild inflammation and congestion/erythema of the stomach.  Biopsies benign, no H. Pylori.  Dr. Benjamine Mola started on Zantac at bedtime. Goes back to see him next month.   Weekly he feels well.  Denies heartburn, abdominal pain, constipation, diarrhea, dysphagia, melena, rectal bleeding.  Occasional sensation to clear her throat.  Uses hard candy or Halls for relief.   Current Outpatient Medications  Medication Sig Dispense Refill  . amLODipine (NORVASC) 10 MG tablet Take 1 tablet (10 mg total) daily by mouth. 90 tablet 1  . amLODipine (NORVASC) 10 MG tablet TAKE 1 TABLET DAILY 90 tablet 0  . Cholecalciferol (VITAMIN D) 2000 units CAPS Take 2,000 Units by mouth daily.    . fluticasone (FLONASE) 50 MCG/ACT nasal spray PLACE 2 SPRAYS INTO BOTH NOSTRILS DAILY. (Patient taking differently: Place 2 sprays into both nostrils at bedtime. ) 16 g 5  . Multiple Vitamin (MULTIVITAMIN WITH MINERALS) TABS tablet Take 1 tablet by mouth daily.    . pantoprazole (PROTONIX) 20 MG tablet TAKE 1 TABLET BY MOUTH TWICE A DAY 180 tablet 0  . ranitidine (ZANTAC) 150 MG tablet TAKE 1 TABELT AT BEDTIME  12  . simvastatin (ZOCOR) 20 MG tablet Take 1 tablet (20 mg total) at bedtime by mouth. 90 tablet 1  .  simvastatin (ZOCOR) 20 MG tablet TAKE 1 TABLET AT BEDTIME 90 tablet 0   No current facility-administered medications for this visit.     Allergies as of 03/29/2017  . (No Known Allergies)    ROS:  General: Negative for anorexia, weight loss, fever, chills, fatigue, weakness. ENT: Negative for hoarseness, difficulty swallowing , nasal congestion. CV: Negative for chest pain, angina, palpitations, dyspnea on exertion, peripheral edema.  Respiratory: Negative for dyspnea at rest, dyspnea on exertion, cough, sputum, wheezing.  GI: See history of present illness. GU:  Negative for dysuria, hematuria, urinary incontinence, urinary frequency, nocturnal urination.  Endo: Negative for unusual weight change.    Physical Examination:   BP 125/77   Pulse 88   Temp (!) 96.9 F (36.1 C) (Oral)   Ht 5\' 9"  (1.753 m)   Wt 157 lb 6.4 oz (71.4 kg)   BMI 23.24 kg/m   General: Well-nourished, well-developed in no acute distress.  Eyes: No icterus. Mouth: Oropharyngeal mucosa moist and pink , no lesions erythema or exudate. Lungs: Clear to auscultation bilaterally.  Heart: Regular rate and rhythm, no murmurs rubs or gallops.  Abdomen: Bowel sounds are normal, nontender, nondistended, no hepatosplenomegaly or masses, no abdominal bruits or hernia , no rebound or guarding.   Extremities: No lower extremity edema. No clubbing or deformities. Neuro: Alert and oriented x 4   Skin: Warm and dry, no jaundice.   Psych: Alert and cooperative, normal mood and affect.

## 2017-03-29 NOTE — Progress Notes (Signed)
CC'ED TO PCP 

## 2017-03-29 NOTE — Patient Instructions (Signed)
1. Continue pantoprazole 20mg  twice daily 30 minutes before a meal. Continue zantac as per Dr. Deeann Saint instructions.  2. Return to the office in six months to see Dr. Oneida Alar or call sooner if needed.    Gastroesophageal Reflux Disease, Adult Normally, food travels down the esophagus and stays in the stomach to be digested. However, when a person has gastroesophageal reflux disease (GERD), food and stomach acid move back up into the esophagus. When this happens, the esophagus becomes sore and inflamed. Over time, GERD can create small holes (ulcers) in the lining of the esophagus. What are the causes? This condition is caused by a problem with the muscle between the esophagus and the stomach (lower esophageal sphincter, or LES). Normally, the LES muscle closes after food passes through the esophagus to the stomach. When the LES is weakened or abnormal, it does not close properly, and that allows food and stomach acid to go back up into the esophagus. The LES can be weakened by certain dietary substances, medicines, and medical conditions, including:  Tobacco use.  Pregnancy.  Having a hiatal hernia.  Heavy alcohol use.  Certain foods and beverages, such as coffee, chocolate, onions, and peppermint.  What increases the risk? This condition is more likely to develop in:  People who have an increased body weight.  People who have connective tissue disorders.  People who use NSAID medicines.  What are the signs or symptoms? Symptoms of this condition include:  Heartburn.  Difficult or painful swallowing.  The feeling of having a lump in the throat.  Abitter taste in the mouth.  Bad breath.  Having a large amount of saliva.  Having an upset or bloated stomach.  Belching.  Chest pain.  Shortness of breath or wheezing.  Ongoing (chronic) cough or a night-time cough.  Wearing away of tooth enamel.  Weight loss.  Different conditions can cause chest pain. Make sure to  see your health care provider if you experience chest pain. How is this diagnosed? Your health care provider will take a medical history and perform a physical exam. To determine if you have mild or severe GERD, your health care provider may also monitor how you respond to treatment. You may also have other tests, including:  An endoscopy toexamine your stomach and esophagus with a small camera.  A test thatmeasures the acidity level in your esophagus.  A test thatmeasures how much pressure is on your esophagus.  A barium swallow or modified barium swallow to show the shape, size, and functioning of your esophagus.  How is this treated? The goal of treatment is to help relieve your symptoms and to prevent complications. Treatment for this condition may vary depending on how severe your symptoms are. Your health care provider may recommend:  Changes to your diet.  Medicine.  Surgery.  Follow these instructions at home: Diet  Follow a diet as recommended by your health care provider. This may involve avoiding foods and drinks such as: ? Coffee and tea (with or without caffeine). ? Drinks that containalcohol. ? Energy drinks and sports drinks. ? Carbonated drinks or sodas. ? Chocolate and cocoa. ? Peppermint and mint flavorings. ? Garlic and onions. ? Horseradish. ? Spicy and acidic foods, including peppers, chili powder, curry powder, vinegar, hot sauces, and barbecue sauce. ? Citrus fruit juices and citrus fruits, such as oranges, lemons, and limes. ? Tomato-based foods, such as red sauce, chili, salsa, and pizza with red sauce. ? Fried and fatty foods, such as donuts,  french fries, potato chips, and high-fat dressings. ? High-fat meats, such as hot dogs and fatty cuts of red and white meats, such as rib eye steak, sausage, ham, and bacon. ? High-fat dairy items, such as whole milk, butter, and cream cheese.  Eat small, frequent meals instead of large meals.  Avoid  drinking large amounts of liquid with your meals.  Avoid eating meals during the 2-3 hours before bedtime.  Avoid lying down right after you eat.  Do not exercise right after you eat. General instructions  Pay attention to any changes in your symptoms.  Take over-the-counter and prescription medicines only as told by your health care provider. Do not take aspirin, ibuprofen, or other NSAIDs unless your health care provider told you to do so.  Do not use any tobacco products, including cigarettes, chewing tobacco, and e-cigarettes. If you need help quitting, ask your health care provider.  Wear loose-fitting clothing. Do not wear anything tight around your waist that causes pressure on your abdomen.  Raise (elevate) the head of your bed 6 inches (15cm).  Try to reduce your stress, such as with yoga or meditation. If you need help reducing stress, ask your health care provider.  If you are overweight, reduce your weight to an amount that is healthy for you. Ask your health care provider for guidance about a safe weight loss goal.  Keep all follow-up visits as told by your health care provider. This is important. Contact a health care provider if:  You have new symptoms.  You have unexplained weight loss.  You have difficulty swallowing, or it hurts to swallow.  You have wheezing or a persistent cough.  Your symptoms do not improve with treatment.  You have a hoarse voice. Get help right away if:  You have pain in your arms, neck, jaw, teeth, or back.  You feel sweaty, dizzy, or light-headed.  You have chest pain or shortness of breath.  You vomit and your vomit looks like blood or coffee grounds.  You faint.  Your stool is bloody or black.  You cannot swallow, drink, or eat. This information is not intended to replace advice given to you by your health care provider. Make sure you discuss any questions you have with your health care provider. Document Released:  12/21/2004 Document Revised: 08/11/2015 Document Reviewed: 07/08/2014 Elsevier Interactive Patient Education  [employer].

## 2017-04-19 ENCOUNTER — Ambulatory Visit: Payer: Medicare Other | Admitting: *Deleted

## 2017-04-23 ENCOUNTER — Encounter: Payer: Self-pay | Admitting: Family

## 2017-04-23 ENCOUNTER — Ambulatory Visit (INDEPENDENT_AMBULATORY_CARE_PROVIDER_SITE_OTHER): Payer: Medicare Other | Admitting: Family

## 2017-04-23 VITALS — BP 138/83 | HR 78 | Temp 97.4°F | Ht 69.0 in | Wt 161.8 lb

## 2017-04-23 DIAGNOSIS — I10 Essential (primary) hypertension: Secondary | ICD-10-CM | POA: Diagnosis not present

## 2017-04-23 DIAGNOSIS — E559 Vitamin D deficiency, unspecified: Secondary | ICD-10-CM

## 2017-04-23 DIAGNOSIS — F458 Other somatoform disorders: Secondary | ICD-10-CM | POA: Diagnosis not present

## 2017-04-23 DIAGNOSIS — R1319 Other dysphagia: Secondary | ICD-10-CM

## 2017-04-23 DIAGNOSIS — E785 Hyperlipidemia, unspecified: Secondary | ICD-10-CM

## 2017-04-23 DIAGNOSIS — K219 Gastro-esophageal reflux disease without esophagitis: Secondary | ICD-10-CM

## 2017-04-23 DIAGNOSIS — R1310 Dysphagia, unspecified: Secondary | ICD-10-CM | POA: Diagnosis not present

## 2017-04-23 DIAGNOSIS — R0989 Other specified symptoms and signs involving the circulatory and respiratory systems: Secondary | ICD-10-CM

## 2017-04-23 NOTE — Patient Instructions (Signed)
Food Choices for Gastroesophageal Reflux Disease, Adult When you have gastroesophageal reflux disease (GERD), the foods you eat and your eating habits are very important. Choosing the right foods can help ease your discomfort. What guidelines do I need to follow?  Choose fruits, vegetables, whole grains, and low-fat dairy products.  Choose low-fat meat, fish, and poultry.  Limit fats such as oils, salad dressings, butter, nuts, and avocado.  Keep a food diary. This helps you identify foods that cause symptoms.  Avoid foods that cause symptoms. These may be different for everyone.  Eat small meals often instead of 3 large meals a day.  Eat your meals slowly, in a place where you are relaxed.  Limit fried foods.  Cook foods using methods other than frying.  Avoid drinking alcohol.  Avoid drinking large amounts of liquids with your meals.  Avoid bending over or lying down until 2-3 hours after eating. What foods are not recommended? These are some foods and drinks that may make your symptoms worse: Vegetables  Tomatoes. Tomato juice. Tomato and spaghetti sauce. Chili peppers. Onion and garlic. Horseradish. Fruits  Oranges, grapefruit, and lemon (fruit and juice). Meats  High-fat meats, fish, and poultry. This includes hot dogs, ribs, ham, sausage, salami, and bacon. Dairy  Whole milk and chocolate milk. Sour cream. Cream. Butter. Ice cream. Cream cheese. Drinks  Coffee and tea. Bubbly (carbonated) drinks or energy drinks. Condiments  Hot sauce. Barbecue sauce. Sweets/Desserts  Chocolate and cocoa. Donuts. Peppermint and spearmint. Fats and Oils  High-fat foods. This includes French fries and potato chips. Other  Vinegar. Strong spices. This includes black pepper, white pepper, red pepper, cayenne, curry powder, cloves, ginger, and chili powder. The items listed above may not be a complete list of foods and drinks to avoid. Contact your dietitian for more information.    This information is not intended to replace advice given to you by your health care provider. Make sure you discuss any questions you have with your health care provider. Document Released: 09/12/2011 Document Revised: 08/19/2015 Document Reviewed: 01/15/2013 Elsevier Interactive Patient Education  2017 Elsevier Inc.  

## 2017-04-23 NOTE — Progress Notes (Signed)
Subjective:    Patient ID: Logan Price, male    DOB: Feb 03, 1946, 72 y.o.   MRN: [id number]  Pt presents to the office today for chronic follow up. Pt is followed by GI and ENT for GERD and Globus sensation. PT states these have improved since taking the protonix and zantac.  Hypertension  This is a chronic problem. The current episode started more than 1 year ago. The problem has been resolved since onset. The problem is controlled. Pertinent negatives include no headaches, malaise/fatigue, peripheral edema, PND or shortness of breath. Risk factors for coronary artery disease include dyslipidemia. The current treatment provides moderate improvement. There is no history of kidney disease, CAD/MI, CVA or heart failure.  Hyperlipidemia  This is a chronic problem. The current episode started more than 1 year ago. The problem is controlled. Recent lipid tests were reviewed and are normal. Pertinent negatives include no shortness of breath. Current antihyperlipidemic treatment includes statins. The current treatment provides moderate improvement of lipids. Risk factors for coronary artery disease include dyslipidemia, male sex and a sedentary lifestyle.  Gastroesophageal Reflux  He complains of coughing, globus sensation and heartburn. He reports no belching or no choking. This is a chronic problem. The current episode started more than 1 year ago. The problem has been waxing and waning. The symptoms are aggravated by lying down. He has tried a PPI for the symptoms. The treatment provided moderate relief.      Review of Systems  Constitutional: Negative for malaise/fatigue.  Respiratory: Positive for cough. Negative for choking and shortness of breath.   Cardiovascular: Negative for PND.  Gastrointestinal: Positive for heartburn.  Neurological: Negative for headaches.  All other systems reviewed and are negative.      Objective:   Physical Exam  Constitutional: He is oriented to person,  place, and time. He appears well-developed and well-nourished. No distress.  HENT:  Head: Normocephalic.  Right Ear: External ear normal.  Left Ear: External ear normal.  Nose: Nose normal.  Mouth/Throat: Oropharynx is clear and moist. No oropharyngeal exudate.  Eyes: Pupils are equal, round, and reactive to light. Right eye exhibits no discharge. Left eye exhibits no discharge.  Neck: Normal range of motion. Neck supple. No thyromegaly present.  Cardiovascular: Normal rate, regular rhythm, normal heart sounds and intact distal pulses.  No murmur heard. Pulmonary/Chest: Effort normal and breath sounds normal. No respiratory distress. He has no wheezes.  Abdominal: Soft. Bowel sounds are normal. He exhibits no distension. There is no tenderness.  Musculoskeletal: Normal range of motion. He exhibits no edema or tenderness.  Neurological: He is alert and oriented to person, place, and time.  Skin: Skin is warm and dry. No rash noted. No erythema.  Psychiatric: He has a normal mood and affect. His behavior is normal. Judgment and thought content normal.  Vitals reviewed.     BP 138/83   Pulse 78   Temp (!) 97.4 F (36.3 C)   Ht 5\' 9"  (1.753 m)   Wt 161 lb 12.8 oz (73.4 kg)   BMI 23.89 kg/m      Assessment & Plan:  1. Essential hypertension - CMP14+EGFR  2. Gastroesophageal reflux disease without esophagitis - CMP14+EGFR  3. Hyperlipidemia, unspecified hyperlipidemia type - Lipid panel - CMP14+EGFR  4. Vitamin D deficiency - CMP14+EGFR - VITAMIN D 25 Hydroxy (Vit-D Deficiency, Fractures)   5. Globus sensation Continue medications and follow up with GI and ENT  6. Esophageal dysphagia    Continue all meds  Labs pending Health Maintenance reviewed Diet and exercise encouraged RTO 6 months and keep follow up with GI and ENT  [name], FNP

## 2017-04-24 LAB — CMP14+EGFR
ALT: 22 IU/L (ref 0–44)
AST: 29 IU/L (ref 0–40)
Albumin/Globulin Ratio: 1.8 (ref 1.2–2.2)
Albumin: 4.3 g/dL (ref 3.5–4.8)
Alkaline Phosphatase: 49 IU/L (ref 39–117)
BUN/Creatinine Ratio: 14 (ref 10–24)
BUN: 15 mg/dL (ref 8–27)
Bilirubin Total: 0.5 mg/dL (ref 0.0–1.2)
CO2: 24 mmol/L (ref 20–29)
CREATININE: 1.07 mg/dL (ref 0.76–1.27)
Calcium: 8.7 mg/dL (ref 8.6–10.2)
Chloride: 100 mmol/L (ref 96–106)
GFR calc Af Amer: 81 mL/min/{1.73_m2} (ref >59)
GFR calc non Af Amer: 70 mL/min/{1.73_m2} (ref >59)
GLOBULIN, TOTAL: 2.4 g/dL (ref 1.5–4.5)
GLUCOSE: 118 mg/dL — AB (ref 65–99)
Potassium: 3.6 mmol/L (ref 3.5–5.2)
SODIUM: 140 mmol/L (ref 134–144)
Total Protein: 6.7 g/dL (ref 6.0–8.5)

## 2017-04-24 LAB — VITAMIN D 25 HYDROXY (VIT D DEFICIENCY, FRACTURES): VIT D 25 HYDROXY: 45.1 ng/mL (ref 30.0–100.0)

## 2017-04-24 LAB — LIPID PANEL
CHOL/HDL RATIO: 2.3 ratio (ref 0.0–5.0)
Cholesterol, Total: 148 mg/dL (ref 100–199)
HDL: 63 mg/dL (ref >39)
LDL CALC: 65 mg/dL (ref 0–99)
Triglycerides: 100 mg/dL (ref 0–149)
VLDL Cholesterol Cal: 20 mg/dL (ref 5–40)

## 2017-04-26 ENCOUNTER — Other Ambulatory Visit: Payer: Self-pay

## 2017-04-26 DIAGNOSIS — K219 Gastro-esophageal reflux disease without esophagitis: Secondary | ICD-10-CM

## 2017-04-26 MED ORDER — PANTOPRAZOLE SODIUM 20 MG PO TBEC: 20.0000 mg | DELAYED_RELEASE_TABLET | Freq: Two times a day (BID) | ORAL | 0 refills | Status: DC

## 2017-04-30 ENCOUNTER — Encounter: Payer: Self-pay | Admitting: *Deleted

## 2017-04-30 ENCOUNTER — Ambulatory Visit (INDEPENDENT_AMBULATORY_CARE_PROVIDER_SITE_OTHER): Payer: Medicare Other | Admitting: *Deleted

## 2017-04-30 VITALS — BP 118/74 | HR 78 | Ht 69.0 in | Wt 160.0 lb

## 2017-04-30 DIAGNOSIS — Z0000 Encounter for general adult medical examination without abnormal findings: Secondary | ICD-10-CM | POA: Diagnosis not present

## 2017-04-30 NOTE — Progress Notes (Signed)
Subjective:   Logan Price is a 72 y.o. male who presents for a subsequent Medicare Annual Wellness Visit. Logan Price is originally from [place]. He is retired from [employer]. He has 5 adult children and many grandchildren.   Review of Systems  Reports that his health is about the same as last year.   Cardiac Risk Factors include: advanced age (>38men, >74 women);male gender;hypertension;sedentary lifestyle  Other systems negative   Objective:    Today's Vitals   04/30/17 1505  BP: 118/74  Pulse: 78  Weight: 160 lb (72.6 kg)  Height: 5\' 9"  (1.753 m)   Body mass index is 23.63 kg/m.  Advanced Directives 04/30/2017 12/19/2016 12/15/2016 12/10/2015 09/28/2015 07/12/2015 04/29/2015  Does Patient Have a Medical Advance Directive? No No No No No No No  Would patient like information on creating a medical advance directive? No - Patient declined No - Patient declined No - Patient declined Yes - Educational materials given No - patient declined information No - patient declined information No - patient declined information    Current Medications (verified) Outpatient Encounter Medications as of 04/30/2017  Medication Sig  . amLODipine (NORVASC) 10 MG tablet Take 1 tablet (10 mg total) daily by mouth.  . Cholecalciferol (VITAMIN D) 2000 units CAPS Take 2,000 Units by mouth daily.  . fluticasone (FLONASE) 50 MCG/ACT nasal spray PLACE 2 SPRAYS INTO BOTH NOSTRILS DAILY. (Patient taking differently: Place 2 sprays into both nostrils at bedtime. )  . Multiple Vitamin (MULTIVITAMIN WITH MINERALS) TABS tablet Take 1 tablet by mouth daily.  . pantoprazole (PROTONIX) 20 MG tablet Take 1 tablet (20 mg total) by mouth 2 (two) times daily.  . ranitidine (ZANTAC) 150 MG tablet TAKE 1 TABELT AT BEDTIME  . simvastatin (ZOCOR) 20 MG tablet Take 1 tablet (20 mg total) at bedtime by mouth.   No facility-administered encounter medications on file as of 04/30/2017.     Allergies  (verified) Patient has no known allergies.   History: Past Medical History:  Diagnosis Date  . Diverticulitis   . 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 GERD (gastroesophageal reflux disease)   . Hepatitis C   . HTN (hypertension)   . Hyperlipidemia    Past Surgical History:  Procedure Laterality Date  . BIOPSY  05/04/2015   Procedure: BIOPSY;  Surgeon: Danie Binder, MD;  [facility];  Service: Endoscopy;;  bx's of ileocecal valve   . COLONOSCOPY WITH PROPOFOL N/A 05/04/2015   Dr. Oneida Alar: normal appearing ileum with prominent IC valve with tubular adenomas, moderate diverticulosis in sigmoid colon, ascending colon, and retum. Moderate sized internal hemorrhoids. Surveillance in 5 years  . ESOPHAGOGASTRODUODENOSCOPY (EGD) WITH PROPOFOL N/A 12/19/2016   Procedure: ESOPHAGOGASTRODUODENOSCOPY (EGD) WITH PROPOFOL;  Surgeon: Danie Binder, MD;  Location: [facility];  Service: Endoscopy;  Laterality: N/A;  11:30am  . FLEXIBLE SIGMOIDOSCOPY N/A 12/10/2015   hemorrhoid banding X 3   . HEMORRHOID BANDING N/A 12/10/2015   Procedure: HEMORRHOID BANDING;  Surgeon: Danie Binder, MD;  [facility]: [facility];  Service: Endoscopy;  Laterality: N/A;  1:30 PM  . None to date     As of 04/14/15  . POLYPECTOMY  05/04/2015   Procedure: POLYPECTOMY;  Surgeon: Danie Binder, MD;  [facility];  Service: Endoscopy;;  descending colon polyp, ascending colon polyp  . SAVORY DILATION N/A 12/19/2016   Procedure: SAVORY DILATION;  Surgeon: Danie Binder, MD;  [facility]: [facility];  Service: Endoscopy;  Laterality: N/A;   Family History  Problem Relation Age of Onset  . Cancer Mother   . Hypertension Father   . Hypertension Sister   . Hypertension Brother   . Hypertension Sister   . Aneurysm Brother 55       brain  . Colon cancer Neg Hx    Social History   Socioeconomic History  . Marital status: Married    Spouse name: Not on file  . Number of  children: Not on file  . Years of education: GED  . Highest education level: GED or equivalent  Social Needs  . Financial resource strain: Not hard at all  . Food insecurity - worry: Never true  . Food insecurity - inability: Never true  . Transportation needs - medical: No  . Transportation needs - non-medical: No  Occupational History  . Occupation: Retired    Comment: [employer]  Tobacco Use  . Smoking status: Former Smoker    Packs/day: 1.00    Years: 40.00    Pack years: 40.00    Types: Cigarettes    Last attempt to quit: 10/09/2006    Years since quitting: 10.5  . Smokeless tobacco: Never Used  . Tobacco comment: quit x 9 years  Substance and Sexual Activity  . Alcohol use: Yes    Alcohol/week: 0.0 oz    Comment: 7 oz of vodka per night  . Drug use: No  . Sexual activity: Yes    Birth control/protection: None  Other Topics Concern  . Not on file  Social History Narrative  . Not on file   Tobacco Counseling Counseling given: Not Answered Comment: quit x 9 years No current use  Clinical Intake:   Pain : No/denies pain   Nutritional Status: BMI of 19-24  Normal Diabetes: No  How often do you need to have someone help you when you read instructions, pamphlets, or other written materials from your doctor or pharmacy?: 1 - Never What is the last grade level you completed in school?: GED    Information entered by :: Chong Sicilian, RN  Activities of Daily Living In your present state of health, do you have any difficulty performing the following activities: 04/30/2017 12/15/2016  Hearing? N N  Vision? N N  Comment Yen Le, OD. Every 2 years -  Difficulty concentrating or making decisions? N N  Walking or climbing stairs? N N  Dressing or bathing? N N  Doing errands, shopping? N N  Preparing Food and eating ? N -  Using the Toilet? N -  In the past six months, have you accidently leaked urine? N -  Do you have problems with loss of bowel  control? N -  Managing your Medications? N -  Managing your Finances? N -  Housekeeping or managing your Housekeeping? N -  Some recent data might be hidden     Immunizations and Health Maintenance Immunization History  Administered Date(s) Administered  . Hep A / Hep B 06/03/2015, 07/06/2015, 01/04/2016  . Pneumococcal Conjugate-13 09/03/2013  . Pneumococcal Polysaccharide-23 10/09/2014  . Tdap 03/04/2010   There are no preventive care reminders to display for this patient.  Patient Care Team: Sharion Balloon, FNP as PCP - General (Nurse Practitioner) Danie Binder, MD as Consulting Physician (Gastroenterology)  No hospitalizations, ER visits, or surgeries this past year.      Assessment:   This is a routine wellness examination for  Priscilla.  Hearing/Vision screen No deficits noted during visit.   Dietary issues and exercise activities discussed: Current Exercise Habits: The patient does not participate in regular exercise at present, Exercise limited by: None identified  Goals    . Exercise 150 min/wk Moderate Activity      Depression Screen PHQ 2/9 Scores 04/30/2017 04/23/2017 10/20/2016 04/21/2016  PHQ - 2 Score 0 0 0 0    Fall Risk Fall Risk  04/30/2017 04/23/2017 10/20/2016 04/21/2016 10/20/2015  Falls in the past year? No No No No No    Is the patient's home free of loose throw rugs in walkways, pet beds, electrical cords, etc?   yes      Grab bars in the bathroom? no      Handrails on the stairs?   yes      Adequate lighting?   yes   Cognitive Function: MMSE - Mini Mental State Exam 04/30/2017  Orientation to time 5  Orientation to Place 5  Registration 3  Attention/ Calculation 5  Recall 3  Language- name 2 objects 2  Language- repeat 1  Language- follow 3 step command 3  Language- read & follow direction 1  Write a sentence 1  Copy design 1  Total score 30        Screening Tests Health Maintenance  Topic Date Due  . TETANUS/TDAP  03/04/2020  .  COLONOSCOPY  05/03/2025  . Hepatitis C Screening  Completed  . PNA vac Low Risk Adult  Completed  . INFLUENZA VACCINE  Discontinued      Plan:  Increase activity level. Aim for 150 min of moderate activity weekly Keep f/u with PCP Schedule eye exam  I have personally reviewed and noted the following in the patient's chart:   . Medical and social history . Use of alcohol, tobacco or illicit drugs  . Current medications and supplements . Functional ability and status . Nutritional status . Physical activity . Advanced directives . List of other physicians . Hospitalizations, surgeries, and ER visits in previous 12 months . Vitals . Screenings to include cognitive, depression, and falls . Referrals and appointments  In addition, I have reviewed and discussed with patient certain preventive protocols, quality metrics, and best practice recommendations. A written personalized care plan for preventive services as well as general preventive health recommendations were provided to patient.     Chong Sicilian, RN   04/30/2017

## 2017-04-30 NOTE — Patient Instructions (Signed)
  Logan Price , Thank you for taking time to come for your Medicare Wellness Visit. I appreciate your ongoing commitment to your health goals. Please review the following plan we discussed and let me know if I can assist you in the future.   These are the goals we discussed: Goals    . Exercise 150 min/wk Moderate Activity       This is a list of the screening recommended for you and due dates:  Health Maintenance  Topic Date Due  . Tetanus Vaccine  03/04/2020  . Colon Cancer Screening  05/03/2025  .  Hepatitis C: One time screening is recommended by Center for Disease Control  (CDC) for  adults born from [birth year] through 1964.   Completed  . Pneumonia vaccines  Completed  . Flu Shot  Discontinued

## 2017-05-07 ENCOUNTER — Other Ambulatory Visit: Payer: Self-pay | Admitting: Family

## 2017-05-07 DIAGNOSIS — K219 Gastro-esophageal reflux disease without esophagitis: Secondary | ICD-10-CM

## 2017-05-24 ENCOUNTER — Ambulatory Visit (INDEPENDENT_AMBULATORY_CARE_PROVIDER_SITE_OTHER): Payer: Medicare Other | Admitting: Otolaryngology

## 2017-05-24 DIAGNOSIS — K219 Gastro-esophageal reflux disease without esophagitis: Secondary | ICD-10-CM | POA: Diagnosis not present

## 2017-05-24 DIAGNOSIS — R070 Pain in throat: Secondary | ICD-10-CM | POA: Diagnosis not present

## 2017-06-14 ENCOUNTER — Encounter (INDEPENDENT_AMBULATORY_CARE_PROVIDER_SITE_OTHER): Payer: Self-pay | Admitting: *Deleted

## 2017-06-21 ENCOUNTER — Telehealth (INDEPENDENT_AMBULATORY_CARE_PROVIDER_SITE_OTHER): Payer: Self-pay | Admitting: *Deleted

## 2017-06-21 ENCOUNTER — Telehealth (INDEPENDENT_AMBULATORY_CARE_PROVIDER_SITE_OTHER): Payer: Self-pay | Admitting: Internal Medicine

## 2017-06-21 DIAGNOSIS — B182 Chronic viral hepatitis C: Secondary | ICD-10-CM

## 2017-06-21 NOTE — Telephone Encounter (Signed)
US ordered

## 2017-06-21 NOTE — Telephone Encounter (Signed)
Forwarded to Ann. 

## 2017-06-21 NOTE — Telephone Encounter (Signed)
Patient is on recall for 6 month [place] for Hep C -- need order

## 2017-06-21 NOTE — Telephone Encounter (Signed)
[place] sch'd 06/27/17 at 1030 (1015), npo after midnight, patient aware

## 2017-06-27 ENCOUNTER — Ambulatory Visit (HOSPITAL_COMMUNITY)
Admission: RE | Admit: 2017-06-27 | Discharge: 2017-06-27 | Disposition: A | Discharge status: Home / Self Care | Payer: Medicare Other | Source: Ambulatory Visit | Attending: Internal Medicine | Admitting: Internal Medicine

## 2017-06-27 DIAGNOSIS — B1920 Unspecified viral hepatitis C without hepatic coma: Secondary | ICD-10-CM | POA: Diagnosis not present

## 2017-06-27 DIAGNOSIS — B182 Chronic viral hepatitis C: Secondary | ICD-10-CM | POA: Insufficient documentation

## 2017-06-27 DIAGNOSIS — K824 Cholesterolosis of gallbladder: Secondary | ICD-10-CM | POA: Insufficient documentation

## 2017-07-27 ENCOUNTER — Other Ambulatory Visit: Payer: Self-pay | Admitting: Family

## 2017-07-27 DIAGNOSIS — J309 Allergic rhinitis, unspecified: Secondary | ICD-10-CM

## 2017-08-03 ENCOUNTER — Other Ambulatory Visit: Payer: Self-pay | Admitting: Family

## 2017-08-03 DIAGNOSIS — K219 Gastro-esophageal reflux disease without esophagitis: Secondary | ICD-10-CM

## 2017-08-23 ENCOUNTER — Ambulatory Visit (INDEPENDENT_AMBULATORY_CARE_PROVIDER_SITE_OTHER): Payer: Medicare Other | Admitting: Otolaryngology

## 2017-08-23 DIAGNOSIS — K219 Gastro-esophageal reflux disease without esophagitis: Secondary | ICD-10-CM

## 2017-08-23 DIAGNOSIS — R070 Pain in throat: Secondary | ICD-10-CM | POA: Diagnosis not present

## 2017-08-25 ENCOUNTER — Other Ambulatory Visit: Payer: Self-pay | Admitting: Family

## 2017-08-25 DIAGNOSIS — J309 Allergic rhinitis, unspecified: Secondary | ICD-10-CM

## 2017-08-30 ENCOUNTER — Encounter: Payer: Self-pay | Admitting: Gastroenterology

## 2017-10-23 ENCOUNTER — Ambulatory Visit (INDEPENDENT_AMBULATORY_CARE_PROVIDER_SITE_OTHER): Payer: Medicare Other | Admitting: Family

## 2017-10-23 ENCOUNTER — Encounter: Payer: Self-pay | Admitting: Family

## 2017-10-23 VITALS — BP 131/85 | HR 75 | Temp 98.0°F | Ht 69.0 in | Wt 153.4 lb

## 2017-10-23 DIAGNOSIS — I10 Essential (primary) hypertension: Secondary | ICD-10-CM

## 2017-10-23 DIAGNOSIS — E559 Vitamin D deficiency, unspecified: Secondary | ICD-10-CM

## 2017-10-23 DIAGNOSIS — E785 Hyperlipidemia, unspecified: Secondary | ICD-10-CM

## 2017-10-23 DIAGNOSIS — K219 Gastro-esophageal reflux disease without esophagitis: Secondary | ICD-10-CM | POA: Diagnosis not present

## 2017-10-23 MED ORDER — SIMVASTATIN 20 MG PO TABS: 20.0000 mg | ORAL_TABLET | Freq: Every day | ORAL | 4 refills | Status: DC

## 2017-10-23 MED ORDER — AMLODIPINE BESYLATE 10 MG PO TABS: 10.0000 mg | ORAL_TABLET | Freq: Every day | ORAL | 4 refills | Status: DC

## 2017-10-23 NOTE — Progress Notes (Signed)
Subjective:    Patient ID: Logan Price, male    DOB: 1945/10/31, 72 y.o.   MRN: [id number]  Chief Complaint  Patient presents with  . Medical Management of Chronic Issues    six month recheck   Pt presents to the office today for chronic follow up.Pt is followed by GI and ENT for GERD and Globus sensation. PT states these have improved. Gastroesophageal Reflux  He complains of heartburn and a hoarse voice. He reports no belching or no coughing. This is a chronic problem. The current episode started more than 1 year ago. The problem occurs occasionally. The problem has been waxing and waning. The symptoms are aggravated by certain foods. He has tried a PPI for the symptoms. The treatment provided moderate relief.  Hyperlipidemia  This is a chronic problem. The current episode started more than 1 year ago. The problem is controlled. Recent lipid tests were reviewed and are normal. Pertinent negatives include no shortness of breath. Current antihyperlipidemic treatment includes statins. The current treatment provides moderate improvement of lipids. Risk factors for coronary artery disease include dyslipidemia, male sex, hypertension and a sedentary lifestyle.  Hypertension  This is a chronic problem. The current episode started more than 1 year ago. The problem has been resolved since onset. The problem is controlled. Pertinent negatives include no headaches, malaise/fatigue, peripheral edema or shortness of breath. Risk factors for coronary artery disease include dyslipidemia and male gender. The current treatment provides moderate improvement.      Review of Systems  Constitutional: Negative for malaise/fatigue.  HENT: Positive for hoarse voice.   Respiratory: Negative for cough and shortness of breath.   Gastrointestinal: Positive for heartburn.  Neurological: Negative for headaches.  All other systems reviewed and are negative.      Objective:   Physical Exam  Constitutional: He  is oriented to person, place, and time. He appears well-developed and well-nourished. No distress.  HENT:  Head: Normocephalic.  Right Ear: External ear normal.  Left Ear: External ear normal.  Mouth/Throat: Oropharynx is clear and moist.  Eyes: Pupils are equal, round, and reactive to light. Right eye exhibits no discharge. Left eye exhibits no discharge.  Neck: Normal range of motion. Neck supple. No thyromegaly present.  Cardiovascular: Normal rate, regular rhythm, normal heart sounds and intact distal pulses.  No murmur heard. Pulmonary/Chest: Effort normal and breath sounds normal. No respiratory distress. He has no wheezes.  Abdominal: Soft. Bowel sounds are normal. He exhibits no distension. There is no tenderness.  Musculoskeletal: Normal range of motion. He exhibits no edema or tenderness.  Neurological: He is alert and oriented to person, place, and time. He has normal reflexes. No cranial nerve deficit.  Skin: Skin is warm and dry. No rash noted. No erythema.  Psychiatric: He has a normal mood and affect. His behavior is normal. Judgment and thought content normal.  Vitals reviewed.     BP 131/85   Pulse 75   Temp 98 F (36.7 C) (Oral)   Ht 5\' 9"  (1.753 m)   Wt 153 lb 6.4 oz (69.6 kg)   BMI 22.65 kg/m      Assessment & Plan:  Logan Price comes in today with chief complaint of Medical Management of Chronic Issues (six month recheck)   Diagnosis and orders addressed:  1. Gastroesophageal reflux disease without esophagitis - CMP14+EGFR - CBC with Differential/Platelet  2. Hyperlipidemia, unspecified hyperlipidemia type - CMP14+EGFR - CBC with Differential/Platelet - Lipid panel - simvastatin (ZOCOR) 20  MG tablet; Take 1 tablet (20 mg total) by mouth at bedtime.  Dispense: 90 tablet; Refill: 4  3. Essential hypertension  - CMP14+EGFR - CBC with Differential/Platelet - amLODipine (NORVASC) 10 MG tablet; Take 1 tablet (10 mg total) by mouth daily.  Dispense:  90 tablet; Refill: 4  4. Vitamin D deficiency - CMP14+EGFR - CBC with Differential/Platelet - VITAMIN D 25 Hydroxy (Vit-D Deficiency, Fractures)   Labs pending Health Maintenance reviewed Diet and exercise encouraged  Follow up plan: 6 months   [name], FNP

## 2017-10-23 NOTE — Patient Instructions (Signed)

## 2017-10-24 ENCOUNTER — Other Ambulatory Visit: Payer: Self-pay | Admitting: Family

## 2017-10-24 LAB — CMP14+EGFR
ALBUMIN: 4.4 g/dL (ref 3.5–4.8)
ALK PHOS: 49 IU/L (ref 39–117)
ALT: 11 IU/L (ref 0–44)
AST: 20 IU/L (ref 0–40)
Albumin/Globulin Ratio: 1.9 (ref 1.2–2.2)
BILIRUBIN TOTAL: 0.4 mg/dL (ref 0.0–1.2)
BUN / CREAT RATIO: 9 — AB (ref 10–24)
BUN: 8 mg/dL (ref 8–27)
CHLORIDE: 100 mmol/L (ref 96–106)
CO2: 26 mmol/L (ref 20–29)
Calcium: 8.9 mg/dL (ref 8.6–10.2)
Creatinine, Ser: 0.9 mg/dL (ref 0.76–1.27)
GFR calc Af Amer: 100 mL/min/{1.73_m2} (ref >59)
GFR calc non Af Amer: 86 mL/min/{1.73_m2} (ref >59)
GLOBULIN, TOTAL: 2.3 g/dL (ref 1.5–4.5)
Glucose: 99 mg/dL (ref 65–99)
POTASSIUM: 3.4 mmol/L — AB (ref 3.5–5.2)
SODIUM: 142 mmol/L (ref 134–144)
Total Protein: 6.7 g/dL (ref 6.0–8.5)

## 2017-10-24 LAB — CBC WITH DIFFERENTIAL/PLATELET
BASOS ABS: 0.1 10*3/uL (ref 0.0–0.2)
Basos: 1 %
EOS (ABSOLUTE): 0.1 10*3/uL (ref 0.0–0.4)
EOS: 2 %
HEMATOCRIT: 40.1 % (ref 37.5–51.0)
Hemoglobin: 13.5 g/dL (ref 13.0–17.7)
IMMATURE GRANULOCYTES: 0 %
Immature Grans (Abs): 0 10*3/uL (ref 0.0–0.1)
LYMPHS ABS: 2.4 10*3/uL (ref 0.7–3.1)
Lymphs: 39 %
MCH: 29.8 pg (ref 26.6–33.0)
MCHC: 33.7 g/dL (ref 31.5–35.7)
MCV: 89 fL (ref 79–97)
MONOS ABS: 0.5 10*3/uL (ref 0.1–0.9)
Monocytes: 8 %
NEUTROS PCT: 50 %
Neutrophils Absolute: 3 10*3/uL (ref 1.4–7.0)
PLATELETS: 258 10*3/uL (ref 150–450)
RBC: 4.53 x10E6/uL (ref 4.14–5.80)
RDW: 13.8 % (ref 12.3–15.4)
WBC: 6.1 10*3/uL (ref 3.4–10.8)

## 2017-10-24 LAB — LIPID PANEL
CHOL/HDL RATIO: 2 ratio (ref 0.0–5.0)
Cholesterol, Total: 133 mg/dL (ref 100–199)
HDL: 67 mg/dL (ref >39)
LDL Calculated: 54 mg/dL (ref 0–99)
TRIGLYCERIDES: 58 mg/dL (ref 0–149)
VLDL Cholesterol Cal: 12 mg/dL (ref 5–40)

## 2017-10-24 LAB — VITAMIN D 25 HYDROXY (VIT D DEFICIENCY, FRACTURES): Vit D, 25-Hydroxy: 59.3 ng/mL (ref 30.0–100.0)

## 2017-10-24 MED ORDER — POTASSIUM CHLORIDE ER 10 MEQ PO TBCR: 10.0000 meq | EXTENDED_RELEASE_TABLET | Freq: Every day | ORAL | 2 refills | Status: DC

## 2017-10-25 ENCOUNTER — Other Ambulatory Visit: Payer: Self-pay | Admitting: Family

## 2017-10-25 DIAGNOSIS — K219 Gastro-esophageal reflux disease without esophagitis: Secondary | ICD-10-CM

## 2017-11-01 ENCOUNTER — Other Ambulatory Visit: Payer: Self-pay | Admitting: Family

## 2017-11-01 DIAGNOSIS — E785 Hyperlipidemia, unspecified: Secondary | ICD-10-CM

## 2017-11-01 DIAGNOSIS — I10 Essential (primary) hypertension: Secondary | ICD-10-CM

## 2017-12-12 ENCOUNTER — Ambulatory Visit (INDEPENDENT_AMBULATORY_CARE_PROVIDER_SITE_OTHER): Payer: Medicare Other | Admitting: Gastroenterology

## 2017-12-12 ENCOUNTER — Encounter: Payer: Self-pay | Admitting: Gastroenterology

## 2017-12-12 DIAGNOSIS — K648 Other hemorrhoids: Secondary | ICD-10-CM

## 2017-12-12 DIAGNOSIS — R1319 Other dysphagia: Secondary | ICD-10-CM

## 2017-12-12 DIAGNOSIS — K5792 Diverticulitis of intestine, part unspecified, without perforation or abscess without bleeding: Secondary | ICD-10-CM

## 2017-12-12 DIAGNOSIS — K219 Gastro-esophageal reflux disease without esophagitis: Secondary | ICD-10-CM | POA: Diagnosis not present

## 2017-12-12 DIAGNOSIS — R1310 Dysphagia, unspecified: Secondary | ICD-10-CM | POA: Diagnosis not present

## 2017-12-12 MED ORDER — PANTOPRAZOLE SODIUM 40 MG PO TBEC: DELAYED_RELEASE_TABLET | ORAL | 3 refills | Status: DC

## 2017-12-12 NOTE — Progress Notes (Signed)
Subjective:    Patient ID: Logan Price, male    DOB: 23-Aug-1945, 72 y.o.   MRN: [id number]  [name], FNP  HPI QUESTIONS ABOUT WHETHER HE CAN EAT POPCORN OR PEANUTS BECAUSE HE HAS DIVERTICULOSIS. BMs: OK. SWALLOWING OK. APPETITE: ALRIGHT. RARE HEARTBURN: MAINLY FEELS LIKE GAS IN LUQ.  PT DENIES FEVER, CHILLS, HEMATOCHEZIA, HEMATEMESIS, nausea, vomiting, melena, diarrhea, CHEST PAIN, SHORTNESS OF BREATH, CHANGE IN BOWEL IN HABITS, constipation, abdominal pain, problems swallowing, OR problems with sedation.  Past Medical History:  Diagnosis Date  . Diverticulitis   . 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 GERD (gastroesophageal reflux disease)   . Hepatitis C   . HTN (hypertension)   . Hyperlipidemia    Past Surgical History:  Procedure Laterality Date  . BIOPSY  05/04/2015   Procedure: BIOPSY;  Surgeon: West Bali, MD;  Location: [facility];  Service: Endoscopy;;  bx's of ileocecal valve   . COLONOSCOPY WITH PROPOFOL N/A 05/04/2015     . ESOPHAGOGASTRODUODENOSCOPY (EGD) WITH PROPOFOL N/A 12/19/2016     . FLEXIBLE SIGMOIDOSCOPY N/A 12/10/2015   hemorrhoid banding X 3   . HEMORRHOID BANDING N/A 12/10/2015             . POLYPECTOMY  05/04/2015     . SAVORY DILATION N/A 12/19/2016   Procedure: SAVORY DILATION;  Surgeon: West Bali, MD;  Location: [facility];  Service: Endoscopy;  Laterality: N/A;   No Known Allergies  Current Outpatient Medications  Medication Sig    . amLODipine (NORVASC) 10 MG tablet Take 1 tablet (10 mg total) by mouth daily.    Marland Kitchen aspirin EC 81 MG tablet Take 81 mg by mouth daily.    . Cholecalciferol (VITAMIN D) 2000 units CAPS Take 2,000 Units by mouth daily.    . fluticasone (FLONASE) 50 MCG/ACT nasal spray SPRAY 2 SPRAYS INTO EACH NOSTRIL EVERY DAY    . Multiple Vitamin TABS tablet Take 1 tablet by mouth daily.    Marland Kitchen PROTONIX 20 MG tablet TAKE 1 TABLET BY MOUTH TWICE A DAY    . K-DUR 10 MEQ tablet Take 1  tablet (10 mEq total) by mouth daily.    Marland Kitchen ZOCOR 20 MG tablet Take 1 tablet QHS     Review of Systems PER HPI OTHERWISE ALL SYSTEMS ARE NEGATIVE.    Objective:   Physical Exam  Constitutional: He is oriented to person, place, and time. He appears well-developed and well-nourished. No distress.  HENT:  Head: Normocephalic and atraumatic.  Mouth/Throat: Oropharynx is clear and moist. No oropharyngeal exudate.  Eyes: Pupils are equal, round, and reactive to light. No scleral icterus.  Neck: Normal range of motion. Neck supple.  Cardiovascular: Normal rate, regular rhythm and normal heart sounds.  Pulmonary/Chest: Effort normal and breath sounds normal. No respiratory distress.  Abdominal: Soft. Bowel sounds are normal. He exhibits no distension. There is no tenderness.  Musculoskeletal: He exhibits no edema.  Lymphadenopathy:    He has no cervical adenopathy.  Neurological: He is alert and oriented to person, place, and time.  NO FOCAL DEFICITS  Psychiatric: He has a normal mood and affect.  Vitals reviewed.     Assessment & Plan:

## 2017-12-12 NOTE — Assessment & Plan Note (Signed)
SYMPTOMS CONTROLLED/RESOLVED.  CONTINUE TO MONITOR SYMPTOMS. USE PREPARATION H FOUR TIMES  A DAY IF NEEDED TO RELIEVE RECTAL PAIN/PRESSURE/BLEEDING. FOLLOW UP IN 1 YEAR.

## 2017-12-12 NOTE — Progress Notes (Signed)
On recall  °

## 2017-12-12 NOTE — Assessment & Plan Note (Addendum)
SYMPTOMS CONTROLLED/RESOLVED.  CHANGE FROM PROTONIX 20 MG BID TO PROTONIX 40 MG 30 MINUTES PRIOR TO BREAKFAST. AVOID REFLUX TRIGGERS. FOLLOW UP IN 1 YEAR.

## 2017-12-12 NOTE — Patient Instructions (Addendum)
DRINK WATER TO KEEP YOUR URINE LIGHT YELLOW.  FOLLOW A HIGH FIBER DIET. AVOID ITEMS THAT CAUSE BLOATING & GAS. SEE INFO BELOW.  CONTINUE PROTONIX. TAKE 30 MINUTES PRIOR TO BREAKFAST.  FOLLOW UP IN 1 YEAR.     High-Fiber Diet A high-fiber diet changes your normal diet to include more whole grains, legumes, fruits, and vegetables. Changes in the diet involve replacing refined carbohydrates with unrefined foods. The calorie level of the diet is essentially unchanged. The Dietary Reference Intake (recommended amount) for adult males is 38 grams per day. For adult females, it is 25 grams per day. Pregnant and lactating women should consume 28 grams of fiber per day. Fiber is the intact part of a plant that is not broken down during digestion. Functional fiber is fiber that has been isolated from the plant to provide a beneficial effect in the body.  PURPOSE  Increase stool bulk.   Ease and regulate bowel movements.   Lower cholesterol.   REDUCE RISK OF COLON CANCER INDICATIONS THAT YOU NEED MORE FIBER  Constipation and hemorrhoids.   Uncomplicated diverticulosis (intestine condition) and irritable bowel syndrome.   Weight management.   As a protective measure against hardening of the arteries (atherosclerosis), diabetes, and cancer.   GUIDELINES FOR INCREASING FIBER IN THE DIET  Start adding fiber to the diet slowly. A gradual increase of about 5 more grams (2 slices of whole-wheat bread, 2 servings of most fruits or vegetables, or 1 bowl of high-fiber cereal) per day is best. Too rapid an increase in fiber may result in constipation, flatulence, and bloating.   Drink enough water and fluids to keep your urine clear or pale yellow. Water, juice, or caffeine-free drinks are recommended. Not drinking enough fluid may cause constipation.   Eat a variety of high-fiber foods rather than one type of fiber.   Try to increase your intake of fiber through using high-fiber foods rather than  fiber pills or supplements that contain small amounts of fiber.   The goal is to change the types of food eaten. Do not supplement your present diet with high-fiber foods, but replace foods in your present diet.   INCLUDE A VARIETY OF FIBER SOURCES  Replace refined and processed grains with whole grains, canned fruits with fresh fruits, and incorporate other fiber sources. White rice, white breads, and most bakery goods contain little or no fiber.   Brown whole-grain rice, buckwheat oats, and many fruits and vegetables are all good sources of fiber. These include: broccoli, Brussels sprouts, cabbage, cauliflower, beets, sweet potatoes, white potatoes (skin on), carrots, tomatoes, eggplant, squash, berries, fresh fruits, and dried fruits.   Cereals appear to be the richest source of fiber. Cereal fiber is found in whole grains and bran. Bran is the fiber-rich outer coat of cereal grain, which is largely removed in refining. In whole-grain cereals, the bran remains. In breakfast cereals, the largest amount of fiber is found in those with "bran" in their names. The fiber content is sometimes indicated on the label.   You may need to include additional fruits and vegetables each day.   In baking, for 1 cup white flour, you may use the following substitutions:   1 cup whole-wheat flour minus 2 tablespoons.   1/2 cup white flour plus 1/2 cup whole-wheat flour.

## 2017-12-12 NOTE — Progress Notes (Signed)
cc'ed to pcp °

## 2017-12-12 NOTE — Assessment & Plan Note (Signed)
SYMPTOMS CONTROLLED/RESOLVED.  DISCUSSED DIAGNOSIS, BENEFITS, AND MANAGEMENT OF DIVERTICULITIS. OK TO EAT POPCORN AND PEANUTS UNLESS IT PRECIPITATES AN EPISODE OF DIVERTICULITIS. AWARE OF RISK OF BLEEDING WITH ASA DAILY. FOLLOW UP IN 1 YEAR.

## 2017-12-12 NOTE — Assessment & Plan Note (Signed)
SYMPTOMS CONTROLLED/RESOLVED.  CONTINUE TO MONITOR SYMPTOMS. CONTINUE PROTONIX. TAKE 30 MINUTES PRIOR TO BREAKFAST. FOLLOW UP IN 1 YEAR.

## 2017-12-13 ENCOUNTER — Ambulatory Visit (INDEPENDENT_AMBULATORY_CARE_PROVIDER_SITE_OTHER): Payer: Medicare Other | Admitting: Internal Medicine

## 2017-12-13 ENCOUNTER — Encounter (INDEPENDENT_AMBULATORY_CARE_PROVIDER_SITE_OTHER): Payer: Self-pay | Admitting: Internal Medicine

## 2017-12-13 VITALS — BP 112/66 | HR 70 | Temp 98.6°F | Ht 69.0 in | Wt 153.2 lb

## 2017-12-13 DIAGNOSIS — B182 Chronic viral hepatitis C: Secondary | ICD-10-CM

## 2017-12-13 DIAGNOSIS — R768 Other specified abnormal immunological findings in serum: Secondary | ICD-10-CM

## 2017-12-13 NOTE — Patient Instructions (Signed)
OV as needed 

## 2017-12-13 NOTE — Progress Notes (Signed)
Subjective:    Patient ID: Logan Price, male    DOB: 10-Feb-1946, 72 y.o.   MRN: [id number]  HPI Here today for f/u. Last seen in September of 2018.  Hx of Hepatits C. He C quaint undetected.  Has not done drugs in over 40 yrs. He is doing good. Appetite is good. No weight loss. Weight remains the same. BMs are normal. He says he stopped drinking 12/05/2017.   Has had Hepatitis A and B vaccine.   06/27/2017 US abdomen:  IMPRESSION: Small gallbladder polyp.  No other focal abnormality is noted.  Review of Systems Past Medical History:  Diagnosis Date  . Diverticulitis   . 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 GERD (gastroesophageal reflux disease)   . Hepatitis C   . HTN (hypertension)   . Hyperlipidemia     Past Surgical History:  Procedure Laterality Date  . BIOPSY  05/04/2015   Procedure: BIOPSY;  Surgeon: West Bali, MD;  Location: [facility];  Service: Endoscopy;;  bx's of ileocecal valve   . COLONOSCOPY WITH PROPOFOL N/A 05/04/2015   Dr. Darrick Penna: normal appearing ileum with prominent IC valve with tubular adenomas, moderate diverticulosis in sigmoid colon, ascending colon, and retum. Moderate sized internal hemorrhoids. Surveillance in 5 years  . ESOPHAGOGASTRODUODENOSCOPY (EGD) WITH PROPOFOL N/A 12/19/2016   Procedure: ESOPHAGOGASTRODUODENOSCOPY (EGD) WITH PROPOFOL;  Surgeon: West Bali, MD;  Location: [facility];  Service: Endoscopy;  Laterality: N/A;  11:30am  . FLEXIBLE SIGMOIDOSCOPY N/A 12/10/2015   hemorrhoid banding X 3   . HEMORRHOID BANDING N/A 12/10/2015   Procedure: HEMORRHOID BANDING;  Surgeon: West Bali, MD;  Location: [facility];  Service: Endoscopy;  Laterality: N/A;  1:30 PM  . None to date     As of 04/14/15  . POLYPECTOMY  05/04/2015   Procedure: POLYPECTOMY;  Surgeon: West Bali, MD;  [facility]: [facility];  Service: Endoscopy;;  descending colon polyp, ascending colon polyp  . SAVORY DILATION N/A 12/19/2016     Procedure: SAVORY DILATION;  Surgeon: West Bali, MD;  Location: [facility];  Service: Endoscopy;  Laterality: N/A;    No Known Allergies  Current Outpatient Medications on File Prior to Visit  Medication Sig Dispense Refill  . amLODipine (NORVASC) 10 MG tablet Take 1 tablet (10 mg total) by mouth daily. 90 tablet 4  . aspirin EC 81 MG tablet Take 81 mg by mouth daily.    . Cholecalciferol (VITAMIN D) 2000 units CAPS Take 2,000 Units by mouth daily.    . fluticasone (FLONASE) 50 MCG/ACT nasal spray SPRAY 2 SPRAYS INTO EACH NOSTRIL EVERY DAY 48 g 1  . Multiple Vitamin (MULTIVITAMIN WITH MINERALS) TABS tablet Take 1 tablet by mouth daily.    . pantoprazole (PROTONIX) 40 MG tablet 1 po 30 mins prior to first meal 90 tablet 3  . potassium chloride (K-DUR) 10 MEQ tablet Take 1 tablet (10 mEq total) by mouth daily. 30 tablet 2  . simvastatin (ZOCOR) 20 MG tablet Take 1 tablet (20 mg total) by mouth at bedtime. 90 tablet 4   No current facility-administered medications on file prior to visit.         Objective:   Physical Exam Blood pressure 112/66, pulse 70, temperature 98.6 F (37 C), height 5\' 9"  (1.753 m), weight 153 lb 3.2 oz (69.5 kg). Alert and oriented. Skin warm and dry. Oral mucosa is moist.   . Sclera anicteric, conjunctivae  is pink. Thyroid not enlarged. No cervical lymphadenopathy. Lungs clear. Heart regular rate and rhythm.  Abdomen is soft. Bowel sounds are positive. No hepatomegaly. No abdominal masses felt. No tenderness.  No edema to lower extremities.           Assessment & Plan:  Hepatitis C + antibody with undetected viral load. He is doing well. OV as needed.

## 2017-12-17 LAB — HEPATITIS C RNA QUANTITATIVE
HCV Quantitative Log: <1.18 Log IU/mL
HCV RNA, PCR, QN: NOT DETECTED [IU]/mL

## 2017-12-18 ENCOUNTER — Encounter (INDEPENDENT_AMBULATORY_CARE_PROVIDER_SITE_OTHER): Payer: Self-pay | Admitting: *Deleted

## 2017-12-24 ENCOUNTER — Telehealth (INDEPENDENT_AMBULATORY_CARE_PROVIDER_SITE_OTHER): Payer: Self-pay | Admitting: *Deleted

## 2017-12-24 NOTE — Telephone Encounter (Signed)
Logan Price, does not need an [place]. He had a false positive Hepatitis C antibody. No further work up

## 2017-12-24 NOTE — Telephone Encounter (Signed)
Patient is on recall for 6 mth [place] -- need order please

## 2017-12-25 NOTE — Telephone Encounter (Signed)
Patient aware.

## 2018-01-18 ENCOUNTER — Other Ambulatory Visit: Payer: Self-pay | Admitting: Family

## 2018-02-14 ENCOUNTER — Ambulatory Visit (INDEPENDENT_AMBULATORY_CARE_PROVIDER_SITE_OTHER): Payer: Medicare Other | Admitting: Otolaryngology

## 2018-02-14 DIAGNOSIS — R070 Pain in throat: Secondary | ICD-10-CM

## 2018-02-14 DIAGNOSIS — K219 Gastro-esophageal reflux disease without esophagitis: Secondary | ICD-10-CM

## 2018-02-15 ENCOUNTER — Other Ambulatory Visit (INDEPENDENT_AMBULATORY_CARE_PROVIDER_SITE_OTHER): Payer: Self-pay | Admitting: Otolaryngology

## 2018-02-15 DIAGNOSIS — R221 Localized swelling, mass and lump, neck: Secondary | ICD-10-CM

## 2018-02-18 ENCOUNTER — Other Ambulatory Visit: Payer: Self-pay | Admitting: Family

## 2018-02-18 DIAGNOSIS — J309 Allergic rhinitis, unspecified: Secondary | ICD-10-CM

## 2018-02-22 ENCOUNTER — Ambulatory Visit (HOSPITAL_COMMUNITY)
Admission: RE | Admit: 2018-02-22 | Discharge: 2018-02-22 | Disposition: A | Discharge status: Home / Self Care | Payer: Medicare Other | Source: Ambulatory Visit | Attending: Otolaryngology | Admitting: Otolaryngology

## 2018-02-22 ENCOUNTER — Encounter (HOSPITAL_COMMUNITY): Payer: Self-pay

## 2018-02-22 DIAGNOSIS — R221 Localized swelling, mass and lump, neck: Secondary | ICD-10-CM | POA: Diagnosis not present

## 2018-02-22 DIAGNOSIS — J439 Emphysema, unspecified: Secondary | ICD-10-CM | POA: Diagnosis not present

## 2018-02-22 LAB — POCT I-STAT CREATININE: CREATININE: 1.1 mg/dL (ref 0.61–1.24)

## 2018-02-22 MED ORDER — IOHEXOL 300 MG/ML  SOLN
75.0000 mL | Freq: Once | INTRAMUSCULAR | Status: AC | PRN
  Administered 2018-02-22: 75 mL via INTRAVENOUS

## 2018-04-09 ENCOUNTER — Other Ambulatory Visit: Payer: Self-pay | Admitting: *Deleted

## 2018-04-09 DIAGNOSIS — I10 Essential (primary) hypertension: Secondary | ICD-10-CM

## 2018-04-09 DIAGNOSIS — E785 Hyperlipidemia, unspecified: Secondary | ICD-10-CM

## 2018-04-09 MED ORDER — SIMVASTATIN 20 MG PO TABS: 20.0000 mg | ORAL_TABLET | Freq: Every day | ORAL | 0 refills | Status: DC

## 2018-04-09 MED ORDER — AMLODIPINE BESYLATE 10 MG PO TABS: 10.0000 mg | ORAL_TABLET | Freq: Every day | ORAL | 0 refills | Status: DC

## 2018-04-25 ENCOUNTER — Ambulatory Visit (INDEPENDENT_AMBULATORY_CARE_PROVIDER_SITE_OTHER): Payer: Medicare Other | Admitting: Family

## 2018-04-25 ENCOUNTER — Encounter: Payer: Self-pay | Admitting: Family

## 2018-04-25 VITALS — BP 132/87 | HR 83 | Temp 97.8°F | Ht 69.0 in | Wt 155.2 lb

## 2018-04-25 DIAGNOSIS — E785 Hyperlipidemia, unspecified: Secondary | ICD-10-CM | POA: Diagnosis not present

## 2018-04-25 DIAGNOSIS — E559 Vitamin D deficiency, unspecified: Secondary | ICD-10-CM

## 2018-04-25 DIAGNOSIS — R0989 Other specified symptoms and signs involving the circulatory and respiratory systems: Secondary | ICD-10-CM

## 2018-04-25 DIAGNOSIS — K219 Gastro-esophageal reflux disease without esophagitis: Secondary | ICD-10-CM

## 2018-04-25 DIAGNOSIS — I10 Essential (primary) hypertension: Secondary | ICD-10-CM

## 2018-04-25 NOTE — Patient Instructions (Signed)
Health Maintenance After Age 72 After age 72, you are at a higher risk for certain long-term diseases and infections as well as injuries from falls. Falls are a major cause of broken bones and head injuries in people who are older than age 72. Getting regular preventive care can help to keep you healthy and well. Preventive care includes getting regular testing and making lifestyle changes as recommended by your health care provider. Talk with your health care provider about:  Which screenings and tests you should have. A screening is a test that checks for a disease when you have no symptoms.  A diet and exercise plan that is right for you. What should I know about screenings and tests to prevent falls? Screening and testing are the best ways to find a health problem early. Early diagnosis and treatment give you the best chance of managing medical conditions that are common after age 72. Certain conditions and lifestyle choices may make you more likely to have a fall. Your health care provider may recommend:  Regular vision checks. Poor vision and conditions such as cataracts can make you more likely to have a fall. If you wear glasses, make sure to get your prescription updated if your vision changes.  Medicine review. Work with your health care provider to regularly review all of the medicines you are taking, including over-the-counter medicines. Ask your health care provider about any side effects that may make you more likely to have a fall. Tell your health care provider if any medicines that you take make you feel dizzy or sleepy.  Osteoporosis screening. Osteoporosis is a condition that causes the bones to get weaker. This can make the bones weak and cause them to break more easily.  Blood pressure screening. Blood pressure changes and medicines to control blood pressure can make you feel dizzy.  Strength and balance checks. Your health care provider may recommend certain tests to check your  strength and balance while standing, walking, or changing positions.  Foot health exam. Foot pain and numbness, as well as not wearing proper footwear, can make you more likely to have a fall.  Depression screening. You may be more likely to have a fall if you have a fear of falling, feel emotionally low, or feel unable to do activities that you used to do.  Alcohol use screening. Using too much alcohol can affect your balance and may make you more likely to have a fall. What actions can I take to lower my risk of falls? General instructions  Talk with your health care provider about your risks for falling. Tell your health care provider if: ? You fall. Be sure to tell your health care provider about all falls, even ones that seem minor. ? You feel dizzy, sleepy, or off-balance.  Take over-the-counter and prescription medicines only as told by your health care provider. These include any supplements.  Eat a healthy diet and maintain a healthy weight. A healthy diet includes low-fat dairy products, low-fat (lean) meats, and fiber from whole grains, beans, and lots of fruits and vegetables. Home safety  Remove any tripping hazards, such as rugs, cords, and clutter.  Install safety equipment such as grab bars in bathrooms and safety rails on stairs.  Keep rooms and walkways well-lit. Activity   Follow a regular exercise program to stay fit. This will help you maintain your balance. Ask your health care provider what types of exercise are appropriate for you.  If you need a cane or   walker, use it as recommended by your health care provider.  Wear supportive shoes that have nonskid soles. Lifestyle  Do not drink alcohol if your health care provider tells you not to drink.  If you drink alcohol, limit how much you have: ? 0-1 drink a day for women. ? 0-2 drinks a day for men.  Be aware of how much alcohol is in your drink. In the [place] one drink equals one typical bottle of beer (12  oz), one-half glass of wine (5 oz), or one shot of hard liquor (1 oz).  Do not use any products that contain nicotine or tobacco, such as cigarettes and e-cigarettes. If you need help quitting, ask your health care provider. Summary  Having a healthy lifestyle and getting preventive care can help to protect your health and wellness after age 72.  Screening and testing are the best way to find a health problem early and help you avoid having a fall. Early diagnosis and treatment give you the best chance for managing medical conditions that are more common for people who are older than age 72.  Falls are a major cause of broken bones and head injuries in people who are older than age 72. Take precautions to prevent a fall at home.  Work with your health care provider to learn what changes you can make to improve your health and wellness and to prevent falls. This information is not intended to replace advice given to you by your health care provider. Make sure you discuss any questions you have with your health care provider. Document Released: 01/24/2017 Document Revised: 01/24/2017 Document Reviewed: 01/24/2017 [employer] Interactive Patient Education  2019 [employer]  

## 2018-04-25 NOTE — Progress Notes (Signed)
Subjective:    Patient ID: Logan Price, male    DOB: May 21, 1945, 73 y.o.   MRN: [id number]  Chief Complaint  Patient presents with  . Medical Management of Chronic Issues    six month recheck   Pt presents to the office today for chronic follow up.Pt is followed by GI for GERD and Globus sensation. PT states he continues to have symptoms, but have improved. Hypertension  This is a chronic problem. The current episode started more than 1 year ago. The problem has been resolved since onset. The problem is controlled. Pertinent negatives include no headaches, malaise/fatigue, peripheral edema or shortness of breath. Risk factors for coronary artery disease include dyslipidemia and male gender. The current treatment provides moderate improvement. There is no history of kidney disease, CAD/MI or heart failure.  Gastroesophageal Reflux  He complains of coughing, globus sensation and heartburn. He reports no belching. This is a chronic problem. The current episode started more than 1 year ago. The problem occurs frequently. The symptoms are aggravated by certain foods. He has tried a PPI for the symptoms. The treatment provided moderate relief.  Hyperlipidemia  This is a chronic problem. The current episode started more than 1 year ago. The problem is controlled. Recent lipid tests were reviewed and are normal. Pertinent negatives include no shortness of breath. Current antihyperlipidemic treatment includes statins. The current treatment provides moderate improvement of lipids. Risk factors for coronary artery disease include dyslipidemia, male sex, hypertension and a sedentary lifestyle.      Review of Systems  Constitutional: Negative for malaise/fatigue.  Respiratory: Positive for cough. Negative for shortness of breath.   Gastrointestinal: Positive for heartburn.  Neurological: Negative for headaches.  All other systems reviewed and are negative.      Objective:   Physical  Exam Vitals signs reviewed.  Constitutional:      General: He is not in acute distress.    Appearance: He is well-developed.  HENT:     Head: Normocephalic.     Right Ear: Tympanic membrane and external ear normal.     Left Ear: Tympanic membrane and external ear normal.  Eyes:     General:        Right eye: No discharge.        Left eye: No discharge.     Pupils: Pupils are equal, round, and reactive to light.  Neck:     Musculoskeletal: Normal range of motion and neck supple.     Thyroid: No thyromegaly.  Cardiovascular:     Rate and Rhythm: Normal rate and regular rhythm.     Heart sounds: Normal heart sounds. No murmur.  Pulmonary:     Effort: Pulmonary effort is normal. No respiratory distress.     Breath sounds: Normal breath sounds. No wheezing.  Abdominal:     General: Bowel sounds are normal. There is no distension.     Palpations: Abdomen is soft.     Tenderness: There is no abdominal tenderness.  Musculoskeletal: Normal range of motion.        General: No tenderness.  Skin:    General: Skin is warm and dry.     Findings: No erythema or rash.  Neurological:     Mental Status: He is alert and oriented to person, place, and time.     Cranial Nerves: No cranial nerve deficit.     Deep Tendon Reflexes: Reflexes are normal and symmetric.  Psychiatric:        Behavior: Behavior  normal.        Thought Content: Thought content normal.        Judgment: Judgment normal.       BP 132/87   Pulse 83   Temp 97.8 F (36.6 C) (Oral)   Ht 5\' 9"  (1.753 m)   Wt 155 lb 3.2 oz (70.4 kg)   BMI 22.92 kg/m      Assessment & Plan:  Logan Price comes in today with chief complaint of Medical Management of Chronic Issues (six month recheck)   Diagnosis and orders addressed:  1. Essential hypertension - CMP14+EGFR - CBC with Differential/Platelet  2. Gastroesophageal reflux disease without esophagitis - CMP14+EGFR - CBC with Differential/Platelet  3.  Hyperlipidemia, unspecified hyperlipidemia type - CMP14+EGFR - CBC with Differential/Platelet - Lipid panel  4. Vitamin D deficiency - CMP14+EGFR - CBC with Differential/Platelet - VITAMIN D 25 Hydroxy (Vit-D Deficiency, Fractures)  5. Globus sensation - CMP14+EGFR - CBC with Differential/Platelet   Labs pending Health Maintenance reviewed Diet and exercise encouraged  Follow up plan: 6 months   [name], FNP

## 2018-04-26 ENCOUNTER — Telehealth: Payer: Self-pay | Admitting: Family

## 2018-04-26 LAB — CMP14+EGFR
ALT: 16 IU/L (ref 0–44)
AST: 21 IU/L (ref 0–40)
Albumin/Globulin Ratio: 1.8 (ref 1.2–2.2)
Albumin: 4.6 g/dL (ref 3.7–4.7)
Alkaline Phosphatase: 60 IU/L (ref 39–117)
BUN/Creatinine Ratio: 12 (ref 10–24)
BUN: 12 mg/dL (ref 8–27)
Bilirubin Total: 0.4 mg/dL (ref 0.0–1.2)
CO2: 26 mmol/L (ref 20–29)
Calcium: 9.5 mg/dL (ref 8.6–10.2)
Chloride: 101 mmol/L (ref 96–106)
Creatinine, Ser: 1 mg/dL (ref 0.76–1.27)
GFR calc Af Amer: 87 mL/min/{1.73_m2} (ref >59)
GFR calc non Af Amer: 75 mL/min/{1.73_m2} (ref >59)
GLUCOSE: 109 mg/dL — AB (ref 65–99)
Globulin, Total: 2.6 g/dL (ref 1.5–4.5)
Potassium: 4.7 mmol/L (ref 3.5–5.2)
SODIUM: 145 mmol/L — AB (ref 134–144)
Total Protein: 7.2 g/dL (ref 6.0–8.5)

## 2018-04-26 LAB — CBC WITH DIFFERENTIAL/PLATELET
Basophils Absolute: 0.1 10*3/uL (ref 0.0–0.2)
Basos: 1 %
EOS (ABSOLUTE): 0.1 10*3/uL (ref 0.0–0.4)
Eos: 1 %
HEMATOCRIT: 43.7 % (ref 37.5–51.0)
Hemoglobin: 14.3 g/dL (ref 13.0–17.7)
Immature Grans (Abs): 0 10*3/uL (ref 0.0–0.1)
Immature Granulocytes: 0 %
Lymphocytes Absolute: 1.9 10*3/uL (ref 0.7–3.1)
Lymphs: 30 %
MCH: 28.9 pg (ref 26.6–33.0)
MCHC: 32.7 g/dL (ref 31.5–35.7)
MCV: 89 fL (ref 79–97)
Monocytes Absolute: 0.6 10*3/uL (ref 0.1–0.9)
Monocytes: 9 %
NEUTROS ABS: 3.8 10*3/uL (ref 1.4–7.0)
Neutrophils: 59 %
Platelets: 273 10*3/uL (ref 150–450)
RBC: 4.94 x10E6/uL (ref 4.14–5.80)
RDW: 14 % (ref 11.6–15.4)
WBC: 6.5 10*3/uL (ref 3.4–10.8)

## 2018-04-26 LAB — LIPID PANEL
CHOL/HDL RATIO: 2 ratio (ref 0.0–5.0)
Cholesterol, Total: 163 mg/dL (ref 100–199)
HDL: 83 mg/dL (ref >39)
LDL Calculated: 63 mg/dL (ref 0–99)
Triglycerides: 86 mg/dL (ref 0–149)
VLDL Cholesterol Cal: 17 mg/dL (ref 5–40)

## 2018-04-26 LAB — VITAMIN D 25 HYDROXY (VIT D DEFICIENCY, FRACTURES): VIT D 25 HYDROXY: 42.8 ng/mL (ref 30.0–100.0)

## 2018-04-26 NOTE — Telephone Encounter (Signed)
Left detailed message on patients answering machine 

## 2018-04-26 NOTE — Telephone Encounter (Signed)
Potassium normal. He can stop potassium and we will check on his next visit to make sure it is still in normal range.

## 2018-05-02 ENCOUNTER — Encounter: Payer: Medicare Other | Admitting: *Deleted

## 2018-05-09 ENCOUNTER — Ambulatory Visit (INDEPENDENT_AMBULATORY_CARE_PROVIDER_SITE_OTHER): Payer: Medicare Other | Admitting: *Deleted

## 2018-05-09 DIAGNOSIS — Z0000 Encounter for general adult medical examination without abnormal findings: Secondary | ICD-10-CM

## 2018-05-09 NOTE — Progress Notes (Signed)
Subjective:   Logan Price is a 73 y.o. male who presents for Medicare Annual/Subsequent preventive examination.  Mr. Mcclurg lives at home with his significant other [name] since the age of 5.  Mr. Harold is retired from [employer] of 25 years.  He only eats 2 meals daily and does not exercise.  He has no hospitalizations or surgeries in the past year.  He has had no falls in the past year and overall feels that his health is about the same as it was a year ago.    Objective:    Vitals: There were no vitals taken for this visit.  There is no height or weight on file to calculate BMI.  Advanced Directives 04/30/2017 12/19/2016 12/15/2016 12/10/2015 09/28/2015 07/12/2015 04/29/2015  Does Patient Have a Medical Advance Directive? No No No No No No No  Would patient like information on creating a medical advance directive? No - Patient declined No - Patient declined No - Patient declined Yes - Educational materials given No - patient declined information No - patient declined information No - patient declined information  Information given   Tobacco Social History   Tobacco Use  Smoking Status Former Smoker  . Packs/day: 1.00  . Years: 40.00  . Pack years: 40.00  . Types: Cigarettes  . Last attempt to quit: 10/09/2006  . Years since quitting: 11.5  Smokeless Tobacco Never Used  Tobacco Comment   quit x 9 years     Counseling given: Not Answered Comment: quit x 9 years   Clinical Intake:  Pre-visit preparation completed: No  Pain : No/denies pain     Nutritional Status: BMI of 19-24  Normal Nutritional Risks: None Diabetes: No  How often do you need to have someone help you when you read instructions, pamphlets, or other written materials from your doctor or pharmacy?: 1 - Never What is the last grade level you completed in school?: GED     Information entered by :: Truett Mainland, LPN  Past Medical History:  Diagnosis Date  . Diverticulitis   . False positive  serological test for hepatitis C 12/13/2016  . GERD (gastroesophageal reflux disease)   . Hepatitis C   . HTN (hypertension)   . Hyperlipidemia    Past Surgical History:  Procedure Laterality Date  . BIOPSY  05/04/2015   Procedure: BIOPSY;  Surgeon: Danie Binder, MD;  [facility];  Service: Endoscopy;;  bx's of ileocecal valve   . COLONOSCOPY WITH PROPOFOL N/A 05/04/2015   Dr. Oneida Alar: normal appearing ileum with prominent IC valve with tubular adenomas, moderate diverticulosis in sigmoid colon, ascending colon, and retum. Moderate sized internal hemorrhoids. Surveillance in 5 years  . ESOPHAGOGASTRODUODENOSCOPY (EGD) WITH PROPOFOL N/A 12/19/2016   Procedure: ESOPHAGOGASTRODUODENOSCOPY (EGD) WITH PROPOFOL;  Surgeon: Danie Binder, MD;  [facility];  Service: Endoscopy;  Laterality: N/A;  11:30am  . FLEXIBLE SIGMOIDOSCOPY N/A 12/10/2015   hemorrhoid banding X 3   . HEMORRHOID BANDING N/A 12/10/2015   Procedure: HEMORRHOID BANDING;  Surgeon: Danie Binder, MD;  [facility];  Service: Endoscopy;  Laterality: N/A;  1:30 PM  . None to date     As of 04/14/15  . POLYPECTOMY  05/04/2015   Procedure: POLYPECTOMY;  Surgeon: Danie Binder, MD;  [facility];  Service: Endoscopy;;  descending colon polyp, ascending colon polyp  . SAVORY DILATION N/A 12/19/2016   Procedure: SAVORY DILATION;  Surgeon: Danie Binder,  MD;  [facility];  Service: Endoscopy;  Laterality: N/A;   Family History  Problem Relation Age of Onset  . Cancer Mother   . Hypertension Father   . Hypertension Sister   . Hypertension Brother   . Hypertension Sister   . Aneurysm Brother 15       brain  . Colon cancer Neg Hx   . Colon polyps Neg Hx    Social History   Socioeconomic History  . Marital status: Married    Spouse name: Not on file  . Number of children: Not on file  . Years of education: GED  . Highest education level: GED or equivalent  Occupational  History  . Occupation: Retired    Comment: [employer]  Social Needs  . Financial resource strain: Not hard at all  . Food insecurity:    Worry: Never true    Inability: Never true  . Transportation needs:    Medical: No    Non-medical: No  Tobacco Use  . Smoking status: Former Smoker    Packs/day: 1.00    Years: 40.00    Pack years: 40.00    Types: Cigarettes    Last attempt to quit: 10/09/2006    Years since quitting: 11.5  . Smokeless tobacco: Never Used  . Tobacco comment: quit x 9 years  Substance and Sexual Activity  . Alcohol use: Yes    Alcohol/week: 0.0 standard drinks    Comment: 7 oz of vodka per night  . Drug use: No  . Sexual activity: Yes    Birth control/protection: None  Lifestyle  . Physical activity:    Days per week: 0 days    Minutes per session: 0 min  . Stress: Only a little  Relationships  . Social connections:    Talks on phone: More than three times a week    Gets together: More than three times a week    Attends religious service: Never    Active member of club or organization: No    Attends meetings of clubs or organizations: Never    Relationship status: Married  Other Topics Concern  . Not on file  Social History Narrative  . Not on file    Outpatient Encounter Medications as of 05/09/2018  Medication Sig  . amLODipine (NORVASC) 10 MG tablet Take 1 tablet (10 mg total) by mouth daily.  Marland Kitchen aspirin EC 81 MG tablet Take 81 mg by mouth daily.  . Cholecalciferol (VITAMIN D) 2000 units CAPS Take 2,000 Units by mouth daily.  . fluticasone (FLONASE) 50 MCG/ACT nasal spray SPRAY 2 SPRAYS INTO EACH NOSTRIL EVERY DAY  . Multiple Vitamin (MULTIVITAMIN WITH MINERALS) TABS tablet Take 1 tablet by mouth daily.  . pantoprazole (PROTONIX) 40 MG tablet 1 po 30 mins prior to first meal  . simvastatin (ZOCOR) 20 MG tablet Take 1 tablet (20 mg total) by mouth at bedtime.   No facility-administered encounter medications on file as of  05/09/2018.     Activities of Daily Living In your present state of health, do you have any difficulty performing the following activities: 05/09/2018  Hearing? N  Vision? N  Difficulty concentrating or making decisions? N  Walking or climbing stairs? N  Dressing or bathing? N  Doing errands, shopping? N  Some recent data might be hidden  No difficulties with ADLs at this time  Patient Care Team: Sharion Balloon, FNP as PCP - General (Nurse Practitioner) Danie Binder, MD  as Consulting Physician (Gastroenterology)   Assessment:   This is a routine wellness examination for Jagjit.  Exercise Activities and Dietary recommendations Current Exercise Habits: The patient does not participate in regular exercise at present, Exercise limited by: None identified  Goals    . Exercise 150 min/wk Moderate Activity    . Have 3 meals a day     Eat 3 meals daily that consist of lean proteins, fruits, and vegetables        Fall Risk Fall Risk  05/09/2018 05/09/2018 04/25/2018 10/23/2017 04/30/2017  Falls in the past year? 0 0 0 No No   Is the patient's home free of loose throw rugs in walkways, pet beds, electrical cords, etc?   yes      Grab bars in the bathroom? yes      Handrails on the stairs?   yes      Adequate lighting?   yes  Timed Get Up and Go Performed:   Depression Screen PHQ 2/9 Scores 05/09/2018 04/25/2018 10/23/2017 04/30/2017  PHQ - 2 Score 0 0 0 0  No depression noted at this time   Cognitive Function MMSE - Mini Mental State Exam 05/09/2018 04/30/2017  Orientation to time 5 5  Orientation to Place 5 5  Registration 3 3  Attention/ Calculation 5 5  Recall 3 3  Language- name 2 objects 2 2  Language- repeat 1 1  Language- follow 3 step command 3 3  Language- read & follow direction 1 1  Write a sentence 1 1  Copy design 1 1  Total score 30 30  No memory loss noted at this time       Immunization History  Administered Date(s) Administered  . Hep A / Hep B  06/03/2015, 07/06/2015, 01/04/2016  . Pneumococcal Conjugate-13 09/03/2013  . Pneumococcal Polysaccharide-23 10/09/2014  . Tdap 03/04/2010    Qualifies for Shingles Vaccine? Declined  Screening Tests Health Maintenance  Topic Date Due  . TETANUS/TDAP  03/04/2020  . COLONOSCOPY  05/03/2025  . Hepatitis C Screening  Completed  . PNA vac Low Risk Adult  Completed  . INFLUENZA VACCINE  Discontinued   Cancer Screenings: Lung: Low Dose CT Chest recommended if Age 38-80 years, 30 pack-year currently smoking OR have quit w/in 15years. Patient does qualify. Colorectal: up to date  Additional Screenings: Hepatitis C Screening:      Plan:  Encouraged Mr. Whilden to try to eat 3 meals daily that consist of lean proteins, fruits and vegetables.  Encouraged to try to exercise for at least 30 minutes, 3 times weekly.  Explained the importance of shingrix vaccine, patient declined at this time.  Explained the importance of Advance Directive and information given to discuss with significant other.  Encouraged to keep follow up appointment with Evelina Dun, FNP  I have personally reviewed and noted the following in the patient's chart:   . Medical and social history . Use of alcohol, tobacco or illicit drugs  . Current medications and supplements . Functional ability and status . Nutritional status . Physical activity . Advanced directives . List of other physicians . Hospitalizations, surgeries, and ER visits in previous 12 months . Vitals . Screenings to include cognitive, depression, and falls . Referrals and appointments  In addition, I have reviewed and discussed with patient certain preventive protocols, quality metrics, and best practice recommendations. A written personalized care plan for preventive services as well as general preventive health recommendations were provided to patient.  Wardell Heath, LPN  [id number]/[id number]  I have reviewed and agree with the above AWV  documentation.   Caryl Pina, MD [facility] 05/09/2018, 9:18 PM

## 2018-05-09 NOTE — Patient Instructions (Signed)
  Logan Price , Thank you for taking time to come for your Medicare Wellness Visit. I appreciate your ongoing commitment to your health goals. Please review the following plan we discussed and let me know if I can assist you in the future.   These are the goals we discussed: Goals    . Exercise 150 min/wk Moderate Activity    . Have 3 meals a day     Eat 3 meals daily that consist of lean proteins, fruits, and vegetables        This is a list of the screening recommended for you and due dates:  Health Maintenance  Topic Date Due  . Tetanus Vaccine  03/04/2020  . Colon Cancer Screening  05/03/2025  .  Hepatitis C: One time screening is recommended by Center for Disease Control  (CDC) for  adults born from [birth year] through 1964.   Completed  . Pneumonia vaccines  Completed  . Flu Shot  Discontinued     Logan Price , Thank you for taking time to come for your Medicare Wellness Visit. I appreciate your ongoing commitment to your health goals. Please review the following plan we discussed and let me know if I can assist you in the future.   These are the goals we discussed: Goals    . Exercise 150 min/wk Moderate Activity    . Have 3 meals a day     Eat 3 meals daily that consist of lean proteins, fruits, and vegetables        This is a list of the screening recommended for you and due dates:  Health Maintenance  Topic Date Due  . Tetanus Vaccine  03/04/2020  . Colon Cancer Screening  05/03/2025  .  Hepatitis C: One time screening is recommended by Center for Disease Control  (CDC) for  adults born from [birth year] through 1964.   Completed  . Pneumonia vaccines  Completed  . Flu Shot  Discontinued

## 2018-08-05 ENCOUNTER — Other Ambulatory Visit: Payer: Self-pay | Admitting: Family

## 2018-08-05 DIAGNOSIS — I10 Essential (primary) hypertension: Secondary | ICD-10-CM

## 2018-08-05 DIAGNOSIS — E785 Hyperlipidemia, unspecified: Secondary | ICD-10-CM

## 2018-08-21 ENCOUNTER — Other Ambulatory Visit: Payer: Self-pay | Admitting: Family

## 2018-08-21 DIAGNOSIS — J309 Allergic rhinitis, unspecified: Secondary | ICD-10-CM

## 2018-08-26 DIAGNOSIS — C801 Malignant (primary) neoplasm, unspecified: Secondary | ICD-10-CM

## 2018-08-26 HISTORY — DX: Malignant (primary) neoplasm, unspecified: C80.1

## 2018-09-11 ENCOUNTER — Other Ambulatory Visit: Payer: Self-pay

## 2018-09-11 ENCOUNTER — Ambulatory Visit (INDEPENDENT_AMBULATORY_CARE_PROVIDER_SITE_OTHER): Payer: Medicare Other | Admitting: Internal Medicine

## 2018-09-11 ENCOUNTER — Encounter (INDEPENDENT_AMBULATORY_CARE_PROVIDER_SITE_OTHER): Payer: Self-pay | Admitting: Internal Medicine

## 2018-09-11 VITALS — BP 126/76 | HR 106 | Temp 98.2°F | Ht 69.0 in | Wt 139.3 lb

## 2018-09-11 DIAGNOSIS — R1319 Other dysphagia: Secondary | ICD-10-CM

## 2018-09-11 DIAGNOSIS — R1310 Dysphagia, unspecified: Secondary | ICD-10-CM

## 2018-09-11 NOTE — Progress Notes (Addendum)
Subjective:    Patient ID: Logan Price, male    DOB: 1946-03-09, 73 y.o.   MRN: [id number]  HPI Here today for f/u. Last seen in September of 2019. Hx of dysphagia.  Last EGD/ED was in September of 2018. Impression: Dysphagia most likely due to unknown process in hypopharynx and less likely moderate Schatzki ring. Dilated. Has a positive Hep C antibody but no viral load. No further work up. Has had the Hepatitis A  And B vaccine.  States has been seeing Dr. Suszanne Conners for his throat. States has some swelling. His appetite is not good. States when he eats his throat hurts. Foods are sometimes lodging . No food in particular lodge. No abdominal pain. BMs move okay.  Review of Systems Past Medical History:  Diagnosis Date  . Diverticulitis   . 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 GERD (gastroesophageal reflux disease)   . Hepatitis C   . HTN (hypertension)   . Hyperlipidemia     Past Surgical History:  Procedure Laterality Date  . BIOPSY  05/04/2015   Procedure: BIOPSY;  Surgeon: West Bali, MD;  [facility];  Service: Endoscopy;;  bx's of ileocecal valve   . COLONOSCOPY WITH PROPOFOL N/A 05/04/2015   Dr. Darrick Penna: normal appearing ileum with prominent IC valve with tubular adenomas, moderate diverticulosis in sigmoid colon, ascending colon, and retum. Moderate sized internal hemorrhoids. Surveillance in 5 years  . ESOPHAGOGASTRODUODENOSCOPY (EGD) WITH PROPOFOL N/A 12/19/2016   Procedure: ESOPHAGOGASTRODUODENOSCOPY (EGD) WITH PROPOFOL;  Surgeon: West Bali, MD;  [facility];  Service: Endoscopy;  Laterality: N/A;  11:30am  . FLEXIBLE SIGMOIDOSCOPY N/A 12/10/2015   hemorrhoid banding X 3   . HEMORRHOID BANDING N/A 12/10/2015   Procedure: HEMORRHOID BANDING;  Surgeon: West Bali, MD;  [facility];  Service: Endoscopy;  Laterality: N/A;  1:30 PM  . None to date     As of 04/14/15  . POLYPECTOMY  05/04/2015   Procedure:  POLYPECTOMY;  Surgeon: West Bali, MD;  [facility];  Service: Endoscopy;;  descending colon polyp, ascending colon polyp  . SAVORY DILATION N/A 12/19/2016   Procedure: SAVORY DILATION;  Surgeon: West Bali, MD;  [facility];  Service: Endoscopy;  Laterality: N/A;    No Known Allergies  Current Outpatient Medications on File Prior to Visit  Medication Sig Dispense Refill  . aspirin EC 81 MG tablet Take 81 mg by mouth daily.    . Cholecalciferol (VITAMIN D) 2000 units CAPS Take 2,000 Units by mouth daily.    . fluticasone (FLONASE) 50 MCG/ACT nasal spray SPRAY 2 SPRAYS INTO EACH NOSTRIL EVERY DAY 48 g 0  . Multiple Vitamin (MULTIVITAMIN WITH MINERALS) TABS tablet Take 1 tablet by mouth daily.    . pantoprazole (PROTONIX) 40 MG tablet 1 po 30 mins prior to first meal 90 tablet 3  . simvastatin (ZOCOR) 20 MG tablet TAKE 1 TABLET AT BEDTIME 90 tablet 0  . amLODipine (NORVASC) 10 MG tablet TAKE 1 TABLET DAILY 90 tablet 0   No current facility-administered medications on file prior to visit.         Objective:   Physical Exam Blood pressure 126/76, pulse (!) 106, temperature 98.2 F (36.8 C), height 5\' 9"  (1.753 m), weight 139 lb 4.8 oz (63.2 kg). Alert and oriented. Skin warm and dry. Oral mucosa is moist.   . Sclera anicteric, conjunctivae is pink. Thyroid not  enlarged. No cervical lymphadenopathy. Lungs clear. Heart regular rate and rhythm.  Abdomen is soft. Bowel sounds are positive. No hepatomegaly. No abdominal masses felt. No tenderness.  No edema to lower extremities.         Assessment & Plan:   Dysphagia. Am going to get an DG Esophagram.

## 2018-09-16 ENCOUNTER — Telehealth (INDEPENDENT_AMBULATORY_CARE_PROVIDER_SITE_OTHER): Payer: Self-pay | Admitting: Internal Medicine

## 2018-09-16 DIAGNOSIS — R1310 Dysphagia, unspecified: Secondary | ICD-10-CM

## 2018-09-16 DIAGNOSIS — R1319 Other dysphagia: Secondary | ICD-10-CM

## 2018-09-16 NOTE — Telephone Encounter (Signed)
Order is in.

## 2018-09-20 ENCOUNTER — Other Ambulatory Visit (INDEPENDENT_AMBULATORY_CARE_PROVIDER_SITE_OTHER): Payer: Self-pay | Admitting: Internal Medicine

## 2018-09-20 ENCOUNTER — Ambulatory Visit (HOSPITAL_COMMUNITY)
Admission: RE | Admit: 2018-09-20 | Discharge: 2018-09-20 | Disposition: A | Discharge status: Home / Self Care | Payer: Medicare Other | Source: Ambulatory Visit | Attending: Internal Medicine | Admitting: Internal Medicine

## 2018-09-20 ENCOUNTER — Other Ambulatory Visit: Payer: Self-pay

## 2018-09-20 DIAGNOSIS — R1310 Dysphagia, unspecified: Secondary | ICD-10-CM | POA: Insufficient documentation

## 2018-09-20 DIAGNOSIS — R1319 Other dysphagia: Secondary | ICD-10-CM

## 2018-09-20 DIAGNOSIS — T17320A Food in larynx causing asphyxiation, initial encounter: Secondary | ICD-10-CM | POA: Diagnosis not present

## 2018-09-23 ENCOUNTER — Other Ambulatory Visit (INDEPENDENT_AMBULATORY_CARE_PROVIDER_SITE_OTHER): Payer: Self-pay | Admitting: Internal Medicine

## 2018-09-23 DIAGNOSIS — R933 Abnormal findings on diagnostic imaging of other parts of digestive tract: Secondary | ICD-10-CM

## 2018-09-23 NOTE — Progress Notes (Signed)
referra

## 2018-09-25 DIAGNOSIS — I499 Cardiac arrhythmia, unspecified: Secondary | ICD-10-CM

## 2018-09-25 HISTORY — DX: Cardiac arrhythmia, unspecified: I49.9

## 2018-09-26 ENCOUNTER — Ambulatory Visit (INDEPENDENT_AMBULATORY_CARE_PROVIDER_SITE_OTHER): Payer: Medicare Other | Admitting: Otolaryngology

## 2018-09-26 ENCOUNTER — Encounter (HOSPITAL_COMMUNITY): Payer: Self-pay | Admitting: *Deleted

## 2018-09-26 ENCOUNTER — Other Ambulatory Visit: Payer: Self-pay | Admitting: Otolaryngology

## 2018-09-26 ENCOUNTER — Other Ambulatory Visit: Payer: Self-pay

## 2018-09-26 DIAGNOSIS — D380 Neoplasm of uncertain behavior of larynx: Secondary | ICD-10-CM | POA: Diagnosis not present

## 2018-09-26 DIAGNOSIS — R070 Pain in throat: Secondary | ICD-10-CM | POA: Diagnosis not present

## 2018-09-26 NOTE — Progress Notes (Signed)
Message left for Logan Price to come to [facility] today for Covid 19 screen prior to procedure at [facility] on 09/27/18

## 2018-09-26 NOTE — Progress Notes (Signed)
Spoke with pt for pre-op call. Pt denies cardiac history or diabetes. Is treated for HTN. PCP is Evelina Dun, FNP. Pt denies any chest pain or sob.   Pt will need Covid testing in the AM.   Coronavirus Screening  Have you experienced the following symptoms:  Cough NO Fever (>100.11F)  NO Runny nose NO Sore throat NO Difficulty breathing/shortness of breath  NO  Have you or a family member traveled in the last 14 days and where? NO   Patient reminded that hospital visitation restrictions are in effect and the importance of the restrictions.

## 2018-09-27 ENCOUNTER — Inpatient Hospital Stay (HOSPITAL_COMMUNITY)
Admission: RE | Admit: 2018-09-27 | Discharge: 2018-11-08 | DRG: 011 | Disposition: A | Discharge status: Home Health | Payer: Medicare Other | Attending: Internal Medicine | Admitting: Internal Medicine

## 2018-09-27 ENCOUNTER — Inpatient Hospital Stay (HOSPITAL_COMMUNITY): Payer: Medicare Other | Admitting: Certified Registered"

## 2018-09-27 ENCOUNTER — Encounter (HOSPITAL_COMMUNITY): Payer: Self-pay | Admitting: *Deleted

## 2018-09-27 ENCOUNTER — Encounter (HOSPITAL_COMMUNITY)
Admission: RE | Disposition: A | Discharge status: Home Health | Payer: Self-pay | Source: Home / Self Care | Attending: Otolaryngology

## 2018-09-27 ENCOUNTER — Other Ambulatory Visit: Payer: Self-pay

## 2018-09-27 DIAGNOSIS — J90 Pleural effusion, not elsewhere classified: Secondary | ICD-10-CM | POA: Diagnosis not present

## 2018-09-27 DIAGNOSIS — Z7982 Long term (current) use of aspirin: Secondary | ICD-10-CM

## 2018-09-27 DIAGNOSIS — I472 Ventricular tachycardia: Secondary | ICD-10-CM | POA: Diagnosis not present

## 2018-09-27 DIAGNOSIS — K219 Gastro-esophageal reflux disease without esophagitis: Secondary | ICD-10-CM | POA: Diagnosis present

## 2018-09-27 DIAGNOSIS — R112 Nausea with vomiting, unspecified: Secondary | ICD-10-CM | POA: Diagnosis not present

## 2018-09-27 DIAGNOSIS — Y92230 Patient room in hospital as the place of occurrence of the external cause: Secondary | ICD-10-CM | POA: Diagnosis not present

## 2018-09-27 DIAGNOSIS — E871 Hypo-osmolality and hyponatremia: Secondary | ICD-10-CM | POA: Diagnosis not present

## 2018-09-27 DIAGNOSIS — D649 Anemia, unspecified: Secondary | ICD-10-CM

## 2018-09-27 DIAGNOSIS — B965 Pseudomonas (aeruginosa) (mallei) (pseudomallei) as the cause of diseases classified elsewhere: Secondary | ICD-10-CM | POA: Diagnosis not present

## 2018-09-27 DIAGNOSIS — L7632 Postprocedural hematoma of skin and subcutaneous tissue following other procedure: Secondary | ICD-10-CM | POA: Diagnosis not present

## 2018-09-27 DIAGNOSIS — K0530 Chronic periodontitis, unspecified: Secondary | ICD-10-CM | POA: Diagnosis present

## 2018-09-27 DIAGNOSIS — I4891 Unspecified atrial fibrillation: Secondary | ICD-10-CM | POA: Diagnosis present

## 2018-09-27 DIAGNOSIS — D638 Anemia in other chronic diseases classified elsewhere: Secondary | ICD-10-CM | POA: Diagnosis not present

## 2018-09-27 DIAGNOSIS — R1312 Dysphagia, oropharyngeal phase: Secondary | ICD-10-CM | POA: Diagnosis not present

## 2018-09-27 DIAGNOSIS — Y848 Other medical procedures as the cause of abnormal reaction of the patient, or of later complication, without mention of misadventure at the time of the procedure: Secondary | ICD-10-CM | POA: Diagnosis not present

## 2018-09-27 DIAGNOSIS — E785 Hyperlipidemia, unspecified: Secondary | ICD-10-CM | POA: Diagnosis present

## 2018-09-27 DIAGNOSIS — K036 Deposits [accretions] on teeth: Secondary | ICD-10-CM | POA: Diagnosis present

## 2018-09-27 DIAGNOSIS — Z4689 Encounter for fitting and adjustment of other specified devices: Secondary | ICD-10-CM | POA: Diagnosis not present

## 2018-09-27 DIAGNOSIS — K9423 Gastrostomy malfunction: Secondary | ICD-10-CM

## 2018-09-27 DIAGNOSIS — Z960 Presence of urogenital implants: Secondary | ICD-10-CM | POA: Diagnosis not present

## 2018-09-27 DIAGNOSIS — R1310 Dysphagia, unspecified: Secondary | ICD-10-CM | POA: Diagnosis not present

## 2018-09-27 DIAGNOSIS — R739 Hyperglycemia, unspecified: Secondary | ICD-10-CM | POA: Diagnosis not present

## 2018-09-27 DIAGNOSIS — D72829 Elevated white blood cell count, unspecified: Secondary | ICD-10-CM | POA: Diagnosis not present

## 2018-09-27 DIAGNOSIS — I471 Supraventricular tachycardia: Secondary | ICD-10-CM | POA: Diagnosis not present

## 2018-09-27 DIAGNOSIS — J0410 Acute tracheitis without obstruction: Secondary | ICD-10-CM | POA: Diagnosis not present

## 2018-09-27 DIAGNOSIS — R2681 Unsteadiness on feet: Secondary | ICD-10-CM | POA: Diagnosis not present

## 2018-09-27 DIAGNOSIS — I10 Essential (primary) hypertension: Secondary | ICD-10-CM | POA: Diagnosis present

## 2018-09-27 DIAGNOSIS — Z452 Encounter for adjustment and management of vascular access device: Secondary | ICD-10-CM | POA: Diagnosis not present

## 2018-09-27 DIAGNOSIS — R634 Abnormal weight loss: Secondary | ICD-10-CM | POA: Diagnosis not present

## 2018-09-27 DIAGNOSIS — J181 Lobar pneumonia, unspecified organism: Secondary | ICD-10-CM | POA: Diagnosis not present

## 2018-09-27 DIAGNOSIS — R846 Abnormal cytological findings in specimens from respiratory organs and thorax: Secondary | ICD-10-CM | POA: Diagnosis not present

## 2018-09-27 DIAGNOSIS — T80211A Bloodstream infection due to central venous catheter, initial encounter: Secondary | ICD-10-CM | POA: Diagnosis not present

## 2018-09-27 DIAGNOSIS — I959 Hypotension, unspecified: Secondary | ICD-10-CM | POA: Diagnosis not present

## 2018-09-27 DIAGNOSIS — R627 Adult failure to thrive: Secondary | ICD-10-CM | POA: Diagnosis present

## 2018-09-27 DIAGNOSIS — M6281 Muscle weakness (generalized): Secondary | ICD-10-CM | POA: Diagnosis not present

## 2018-09-27 DIAGNOSIS — R7881 Bacteremia: Secondary | ICD-10-CM | POA: Diagnosis not present

## 2018-09-27 DIAGNOSIS — R197 Diarrhea, unspecified: Secondary | ICD-10-CM | POA: Diagnosis not present

## 2018-09-27 DIAGNOSIS — R590 Localized enlarged lymph nodes: Secondary | ICD-10-CM | POA: Diagnosis not present

## 2018-09-27 DIAGNOSIS — R918 Other nonspecific abnormal finding of lung field: Secondary | ICD-10-CM | POA: Diagnosis not present

## 2018-09-27 DIAGNOSIS — Z931 Gastrostomy status: Secondary | ICD-10-CM | POA: Diagnosis not present

## 2018-09-27 DIAGNOSIS — Z4659 Encounter for fitting and adjustment of other gastrointestinal appliance and device: Secondary | ICD-10-CM

## 2018-09-27 DIAGNOSIS — R338 Other retention of urine: Secondary | ICD-10-CM | POA: Diagnosis not present

## 2018-09-27 DIAGNOSIS — Z79899 Other long term (current) drug therapy: Secondary | ICD-10-CM

## 2018-09-27 DIAGNOSIS — K08409 Partial loss of teeth, unspecified cause, unspecified class: Secondary | ICD-10-CM | POA: Diagnosis present

## 2018-09-27 DIAGNOSIS — J189 Pneumonia, unspecified organism: Secondary | ICD-10-CM | POA: Diagnosis not present

## 2018-09-27 DIAGNOSIS — R0602 Shortness of breath: Secondary | ICD-10-CM

## 2018-09-27 DIAGNOSIS — C320 Malignant neoplasm of glottis: Principal | ICD-10-CM | POA: Diagnosis present

## 2018-09-27 DIAGNOSIS — Z87891 Personal history of nicotine dependence: Secondary | ICD-10-CM | POA: Diagnosis not present

## 2018-09-27 DIAGNOSIS — T827XXD Infection and inflammatory reaction due to other cardiac and vascular devices, implants and grafts, subsequent encounter: Secondary | ICD-10-CM | POA: Diagnosis not present

## 2018-09-27 DIAGNOSIS — M264 Malocclusion, unspecified: Secondary | ICD-10-CM | POA: Diagnosis present

## 2018-09-27 DIAGNOSIS — C329 Malignant neoplasm of larynx, unspecified: Secondary | ICD-10-CM | POA: Diagnosis not present

## 2018-09-27 DIAGNOSIS — Z20828 Contact with and (suspected) exposure to other viral communicable diseases: Secondary | ICD-10-CM | POA: Diagnosis not present

## 2018-09-27 DIAGNOSIS — Z430 Encounter for attention to tracheostomy: Secondary | ICD-10-CM | POA: Diagnosis not present

## 2018-09-27 DIAGNOSIS — Z431 Encounter for attention to gastrostomy: Secondary | ICD-10-CM

## 2018-09-27 DIAGNOSIS — Z7189 Other specified counseling: Secondary | ICD-10-CM | POA: Diagnosis not present

## 2018-09-27 DIAGNOSIS — C4492 Squamous cell carcinoma of skin, unspecified: Secondary | ICD-10-CM | POA: Diagnosis not present

## 2018-09-27 DIAGNOSIS — J387 Other diseases of larynx: Secondary | ICD-10-CM | POA: Diagnosis not present

## 2018-09-27 DIAGNOSIS — E861 Hypovolemia: Secondary | ICD-10-CM | POA: Diagnosis not present

## 2018-09-27 DIAGNOSIS — K089 Disorder of teeth and supporting structures, unspecified: Secondary | ICD-10-CM

## 2018-09-27 DIAGNOSIS — E43 Unspecified severe protein-calorie malnutrition: Secondary | ICD-10-CM | POA: Diagnosis not present

## 2018-09-27 DIAGNOSIS — T503X5A Adverse effect of electrolytic, caloric and water-balance agents, initial encounter: Secondary | ICD-10-CM | POA: Diagnosis not present

## 2018-09-27 DIAGNOSIS — Z9981 Dependence on supplemental oxygen: Secondary | ICD-10-CM | POA: Diagnosis not present

## 2018-09-27 DIAGNOSIS — J439 Emphysema, unspecified: Secondary | ICD-10-CM | POA: Diagnosis present

## 2018-09-27 DIAGNOSIS — Z4682 Encounter for fitting and adjustment of non-vascular catheter: Secondary | ICD-10-CM | POA: Diagnosis not present

## 2018-09-27 DIAGNOSIS — N179 Acute kidney failure, unspecified: Secondary | ICD-10-CM | POA: Diagnosis not present

## 2018-09-27 DIAGNOSIS — Z930 Tracheostomy status: Secondary | ICD-10-CM | POA: Diagnosis not present

## 2018-09-27 DIAGNOSIS — K083 Retained dental root: Secondary | ICD-10-CM | POA: Diagnosis present

## 2018-09-27 DIAGNOSIS — K2970 Gastritis, unspecified, without bleeding: Secondary | ICD-10-CM | POA: Diagnosis not present

## 2018-09-27 DIAGNOSIS — M2634 Vertical displacement of fully erupted tooth or teeth: Secondary | ICD-10-CM | POA: Diagnosis present

## 2018-09-27 DIAGNOSIS — N289 Disorder of kidney and ureter, unspecified: Secondary | ICD-10-CM | POA: Diagnosis not present

## 2018-09-27 DIAGNOSIS — N401 Enlarged prostate with lower urinary tract symptoms: Secondary | ICD-10-CM | POA: Diagnosis not present

## 2018-09-27 DIAGNOSIS — R339 Retention of urine, unspecified: Secondary | ICD-10-CM | POA: Diagnosis not present

## 2018-09-27 DIAGNOSIS — C328 Malignant neoplasm of overlapping sites of larynx: Secondary | ICD-10-CM | POA: Diagnosis not present

## 2018-09-27 DIAGNOSIS — I700 Atherosclerosis of aorta: Secondary | ICD-10-CM | POA: Diagnosis present

## 2018-09-27 DIAGNOSIS — R1313 Dysphagia, pharyngeal phase: Secondary | ICD-10-CM | POA: Diagnosis present

## 2018-09-27 DIAGNOSIS — Z7401 Bed confinement status: Secondary | ICD-10-CM | POA: Diagnosis not present

## 2018-09-27 DIAGNOSIS — D487 Neoplasm of uncertain behavior of other specified sites: Secondary | ICD-10-CM | POA: Diagnosis not present

## 2018-09-27 DIAGNOSIS — J982 Interstitial emphysema: Secondary | ICD-10-CM | POA: Diagnosis not present

## 2018-09-27 DIAGNOSIS — Z972 Presence of dental prosthetic device (complete) (partial): Secondary | ICD-10-CM

## 2018-09-27 DIAGNOSIS — Z466 Encounter for fitting and adjustment of urinary device: Secondary | ICD-10-CM | POA: Diagnosis not present

## 2018-09-27 DIAGNOSIS — M2550 Pain in unspecified joint: Secondary | ICD-10-CM | POA: Diagnosis not present

## 2018-09-27 DIAGNOSIS — R1319 Other dysphagia: Secondary | ICD-10-CM | POA: Diagnosis not present

## 2018-09-27 DIAGNOSIS — R5381 Other malaise: Secondary | ICD-10-CM | POA: Diagnosis not present

## 2018-09-27 DIAGNOSIS — R9431 Abnormal electrocardiogram [ECG] [EKG]: Secondary | ICD-10-CM | POA: Diagnosis not present

## 2018-09-27 DIAGNOSIS — Z419 Encounter for procedure for purposes other than remedying health state, unspecified: Secondary | ICD-10-CM

## 2018-09-27 HISTORY — PX: TRACHEOSTOMY TUBE PLACEMENT: SHX814

## 2018-09-27 HISTORY — PX: MICROLARYNGOSCOPY: SHX5208

## 2018-09-27 LAB — CBC
HCT: 43.6 % (ref 39.0–52.0)
Hemoglobin: 14.2 g/dL (ref 13.0–17.0)
MCH: 29.2 pg (ref 26.0–34.0)
MCHC: 32.6 g/dL (ref 30.0–36.0)
MCV: 89.5 fL (ref 80.0–100.0)
Platelets: 329 10*3/uL (ref 150–400)
RBC: 4.87 MIL/uL (ref 4.22–5.81)
RDW: 13.3 % (ref 11.5–15.5)
WBC: 8.7 10*3/uL (ref 4.0–10.5)
nRBC: 0 % (ref 0.0–0.2)

## 2018-09-27 LAB — BASIC METABOLIC PANEL
Anion gap: 9 (ref 5–15)
BUN: 11 mg/dL (ref 8–23)
CO2: 30 mmol/L (ref 22–32)
Calcium: 9.4 mg/dL (ref 8.9–10.3)
Chloride: 100 mmol/L (ref 98–111)
Creatinine, Ser: 0.92 mg/dL (ref 0.61–1.24)
GFR calc Af Amer: >60 mL/min (ref >60)
GFR calc non Af Amer: >60 mL/min (ref >60)
Glucose, Bld: 112 mg/dL — ABNORMAL HIGH (ref 70–99)
Potassium: 3.9 mmol/L (ref 3.5–5.1)
Sodium: 139 mmol/L (ref 135–145)

## 2018-09-27 LAB — SARS CORONAVIRUS 2 BY RT PCR (HOSPITAL ORDER, PERFORMED IN ~~LOC~~ HOSPITAL LAB): SARS Coronavirus 2: NEGATIVE

## 2018-09-27 SURGERY — MICROLARYNGOSCOPY
Anesthesia: General | Site: Neck

## 2018-09-27 MED ORDER — FENTANYL CITRATE (PF) 100 MCG/2ML IJ SOLN: 25.0000 ug | INTRAMUSCULAR | Status: DC | PRN

## 2018-09-27 MED ORDER — CHLORHEXIDINE GLUCONATE 0.12 % MT SOLN
15.0000 mL | Freq: Two times a day (BID) | OROMUCOSAL | Status: DC
  Administered 2018-09-27 – 2018-11-07 (×64): 15 mL via OROMUCOSAL
  Filled 2018-09-27 (×66): qty 15

## 2018-09-27 MED ORDER — LIDOCAINE-EPINEPHRINE 1 %-1:100000 IJ SOLN
INTRAMUSCULAR | Status: AC
  Filled 2018-09-27: qty 1

## 2018-09-27 MED ORDER — MIDAZOLAM HCL 2 MG/2ML IJ SOLN
INTRAMUSCULAR | Status: AC
  Filled 2018-09-27: qty 2

## 2018-09-27 MED ORDER — CEFAZOLIN SODIUM-DEXTROSE 2-3 GM-%(50ML) IV SOLR
INTRAVENOUS | Status: DC | PRN
  Administered 2018-09-27: 2 g via INTRAVENOUS

## 2018-09-27 MED ORDER — CEFAZOLIN SODIUM 1 G IJ SOLR
INTRAMUSCULAR | Status: AC
  Filled 2018-09-27: qty 20

## 2018-09-27 MED ORDER — MIDAZOLAM HCL 5 MG/5ML IJ SOLN
INTRAMUSCULAR | Status: DC | PRN
  Administered 2018-09-27 (×2): 1 mg via INTRAVENOUS

## 2018-09-27 MED ORDER — PROMETHAZINE HCL 25 MG/ML IJ SOLN: 6.2500 mg | INTRAMUSCULAR | Status: DC | PRN

## 2018-09-27 MED ORDER — PHENYLEPHRINE 40 MCG/ML (10ML) SYRINGE FOR IV PUSH (FOR BLOOD PRESSURE SUPPORT)
PREFILLED_SYRINGE | INTRAVENOUS | Status: AC
  Filled 2018-09-27: qty 20

## 2018-09-27 MED ORDER — DEXAMETHASONE SODIUM PHOSPHATE 10 MG/ML IJ SOLN
INTRAMUSCULAR | Status: AC
  Filled 2018-09-27: qty 1

## 2018-09-27 MED ORDER — KCL IN DEXTROSE-NACL 20-5-0.45 MEQ/L-%-% IV SOLN
INTRAVENOUS | Status: DC
  Administered 2018-09-27 – 2018-10-07 (×21): via INTRAVENOUS
  Filled 2018-09-27 (×22): qty 1000

## 2018-09-27 MED ORDER — ACETAMINOPHEN 10 MG/ML IV SOLN
INTRAVENOUS | Status: AC
  Filled 2018-09-27: qty 100

## 2018-09-27 MED ORDER — ROCURONIUM BROMIDE 10 MG/ML (PF) SYRINGE
PREFILLED_SYRINGE | INTRAVENOUS | Status: AC
  Filled 2018-09-27: qty 10

## 2018-09-27 MED ORDER — CLINDAMYCIN PHOSPHATE 600 MG/50ML IV SOLN
600.0000 mg | Freq: Three times a day (TID) | INTRAVENOUS | Status: AC
  Administered 2018-09-27 – 2018-09-28 (×3): 600 mg via INTRAVENOUS
  Filled 2018-09-27 (×3): qty 50

## 2018-09-27 MED ORDER — ACETAMINOPHEN 10 MG/ML IV SOLN
INTRAVENOUS | Status: DC | PRN
  Administered 2018-09-27: 1000 mg via INTRAVENOUS

## 2018-09-27 MED ORDER — EPINEPHRINE HCL (NASAL) 0.1 % NA SOLN
NASAL | Status: AC
  Filled 2018-09-27: qty 30

## 2018-09-27 MED ORDER — LIDOCAINE-EPINEPHRINE 1 %-1:100000 IJ SOLN
INTRAMUSCULAR | Status: DC | PRN
  Administered 2018-09-27: 20 mL via INTRADERMAL

## 2018-09-27 MED ORDER — ONDANSETRON HCL 4 MG/2ML IJ SOLN
INTRAMUSCULAR | Status: DC | PRN
  Administered 2018-09-27: 4 mg via INTRAVENOUS

## 2018-09-27 MED ORDER — PHENYLEPHRINE 40 MCG/ML (10ML) SYRINGE FOR IV PUSH (FOR BLOOD PRESSURE SUPPORT)
PREFILLED_SYRINGE | INTRAVENOUS | Status: AC
  Filled 2018-09-27: qty 10

## 2018-09-27 MED ORDER — PHENYLEPHRINE 40 MCG/ML (10ML) SYRINGE FOR IV PUSH (FOR BLOOD PRESSURE SUPPORT)
PREFILLED_SYRINGE | INTRAVENOUS | Status: DC | PRN
  Administered 2018-09-27 (×4): 120 ug via INTRAVENOUS

## 2018-09-27 MED ORDER — LACTATED RINGERS IV SOLN
INTRAVENOUS | Status: DC
  Administered 2018-09-27 (×2): via INTRAVENOUS

## 2018-09-27 MED ORDER — FENTANYL CITRATE (PF) 100 MCG/2ML IJ SOLN
INTRAMUSCULAR | Status: DC | PRN
  Administered 2018-09-27 (×2): 50 ug via INTRAVENOUS
  Administered 2018-09-27: 100 ug via INTRAVENOUS
  Administered 2018-09-27: 50 ug via INTRAVENOUS

## 2018-09-27 MED ORDER — 0.9 % SODIUM CHLORIDE (POUR BTL) OPTIME
TOPICAL | Status: DC | PRN
  Administered 2018-09-27: 1000 mL

## 2018-09-27 MED ORDER — HEMOSTATIC AGENTS (NO CHARGE) OPTIME
TOPICAL | Status: DC | PRN
  Administered 2018-09-27: 1 via TOPICAL

## 2018-09-27 MED ORDER — PROPOFOL 10 MG/ML IV BOLUS
INTRAVENOUS | Status: AC
  Filled 2018-09-27: qty 20

## 2018-09-27 MED ORDER — PROPOFOL 500 MG/50ML IV EMUL
INTRAVENOUS | Status: DC | PRN
  Administered 2018-09-27: 50 ug/kg/min via INTRAVENOUS

## 2018-09-27 MED ORDER — PROPOFOL 10 MG/ML IV BOLUS
INTRAVENOUS | Status: DC | PRN
  Administered 2018-09-27: 100 mg via INTRAVENOUS

## 2018-09-27 MED ORDER — DEXAMETHASONE SODIUM PHOSPHATE 10 MG/ML IJ SOLN
INTRAMUSCULAR | Status: DC | PRN
  Administered 2018-09-27: 10 mg via INTRAVENOUS

## 2018-09-27 MED ORDER — SODIUM CHLORIDE (PF) 0.9 % IJ SOLN
INTRAMUSCULAR | Status: AC
  Filled 2018-09-27: qty 30

## 2018-09-27 MED ORDER — STERILE WATER FOR IRRIGATION IR SOLN
Status: DC | PRN
  Administered 2018-09-27: 200 mL

## 2018-09-27 MED ORDER — ORAL CARE MOUTH RINSE
15.0000 mL | Freq: Two times a day (BID) | OROMUCOSAL | Status: DC
  Administered 2018-09-28 – 2018-11-07 (×48): 15 mL via OROMUCOSAL

## 2018-09-27 MED ORDER — ONDANSETRON HCL 4 MG/2ML IJ SOLN
INTRAMUSCULAR | Status: AC
  Filled 2018-09-27: qty 2

## 2018-09-27 MED ORDER — FENTANYL CITRATE (PF) 250 MCG/5ML IJ SOLN
INTRAMUSCULAR | Status: AC
  Filled 2018-09-27: qty 5

## 2018-09-27 MED ORDER — EPINEPHRINE HCL (NASAL) 0.1 % NA SOLN
NASAL | Status: DC | PRN
  Administered 2018-09-27: 30 mL via TOPICAL

## 2018-09-27 MED ORDER — MORPHINE SULFATE (PF) 2 MG/ML IV SOLN
2.0000 mg | INTRAVENOUS | Status: DC | PRN
  Administered 2018-10-29: 2 mg via INTRAVENOUS
  Filled 2018-09-27 (×2): qty 1

## 2018-09-27 SURGICAL SUPPLY — 56 items
BLADE 11 SAFETY STRL DISP (BLADE) IMPLANT
BLADE 15 SAFETY STRL DISP (BLADE) IMPLANT
BLADE CLIPPER SURG (BLADE) IMPLANT
BLADE SURG 15 STRL LF DISP TIS (BLADE) IMPLANT
BLADE SURG 15 STRL SS (BLADE)
CANISTER SUCT 3000ML PPV (MISCELLANEOUS) IMPLANT
CLEANER TIP ELECTROSURG 2X2 (MISCELLANEOUS) ×4 IMPLANT
CONT SPEC 4OZ CLIKSEAL STRL BL (MISCELLANEOUS) ×4 IMPLANT
COVER BACK TABLE 60X90IN (DRAPES) IMPLANT
COVER SURGICAL LIGHT HANDLE (MISCELLANEOUS) ×4 IMPLANT
COVER WAND RF STERILE (DRAPES) IMPLANT
DECANTER SPIKE VIAL GLASS SM (MISCELLANEOUS) ×4 IMPLANT
DERMABOND ADVANCED (GAUZE/BANDAGES/DRESSINGS) ×2
DERMABOND ADVANCED .7 DNX12 (GAUZE/BANDAGES/DRESSINGS) ×2 IMPLANT
DRAPE HALF SHEET 40X57 (DRAPES) ×4 IMPLANT
DRESSING ALLEVYN 2X4 GNTL LT (GAUZE/BANDAGES/DRESSINGS) ×4 IMPLANT
ELECT COATED BLADE 2.86 ST (ELECTRODE) ×4 IMPLANT
ELECT REM PT RETURN 9FT ADLT (ELECTROSURGICAL) ×4
ELECTRODE REM PT RTRN 9FT ADLT (ELECTROSURGICAL) ×2 IMPLANT
GAUZE 4X4 16PLY RFD (DISPOSABLE) ×4 IMPLANT
GAUZE SPONGE 4X4 12PLY STRL (GAUZE/BANDAGES/DRESSINGS) IMPLANT
GAUZE XEROFORM 5X9 LF (GAUZE/BANDAGES/DRESSINGS) IMPLANT
GLOVE BIO SURGEON STRL SZ7.5 (GLOVE) ×8 IMPLANT
GLOVE BIOGEL PI IND STRL 6.5 (GLOVE) ×2 IMPLANT
GLOVE BIOGEL PI INDICATOR 6.5 (GLOVE) ×2
GLOVE ECLIPSE 7.5 STRL STRAW (GLOVE) ×4 IMPLANT
GLOVE SURG SS PI 6.0 STRL IVOR (GLOVE) ×4 IMPLANT
GOWN STRL REUS W/ TWL LRG LVL3 (GOWN DISPOSABLE) ×6 IMPLANT
GOWN STRL REUS W/TWL LRG LVL3 (GOWN DISPOSABLE) ×6
HOLDER TRACH TUBE VELCRO 19.5 (MISCELLANEOUS) ×4 IMPLANT
KIT BASIN OR (CUSTOM PROCEDURE TRAY) ×4 IMPLANT
KIT SUCTION CATH 14FR (SUCTIONS) ×4 IMPLANT
KIT TURNOVER KIT B (KITS) ×4 IMPLANT
MANIFOLD NEPTUNE WASTE (CANNULA) ×4 IMPLANT
MARKER SKIN DUAL TIP RULER LAB (MISCELLANEOUS) IMPLANT
NEEDLE HYPO 25GX1X1/2 BEV (NEEDLE) ×8 IMPLANT
NS IRRIG 1000ML POUR BTL (IV SOLUTION) ×4 IMPLANT
PAD ARMBOARD 7.5X6 YLW CONV (MISCELLANEOUS) ×8 IMPLANT
PATTIES SURGICAL .5 X1 (DISPOSABLE) ×4 IMPLANT
PENCIL BUTTON HOLSTER BLD 10FT (ELECTRODE) IMPLANT
SOLUTION ANTI FOG 6CC (MISCELLANEOUS) ×4 IMPLANT
SPONGE INTESTINAL PEANUT (DISPOSABLE) ×4 IMPLANT
SURGILUBE 2OZ TUBE FLIPTOP (MISCELLANEOUS) ×4 IMPLANT
SUT CHROMIC 3 0 SH 27 (SUTURE) IMPLANT
SUT PROLENE 2 0 SH DA (SUTURE) IMPLANT
SUT SILK 3 0 (SUTURE) ×2
SUT SILK 3-0 18XBRD TIE 12 (SUTURE) ×2 IMPLANT
SYR 20CC LL (SYRINGE) ×4 IMPLANT
SYR BULB 3OZ (MISCELLANEOUS) IMPLANT
SYR CONTROL 10ML LL (SYRINGE) ×8 IMPLANT
TOWEL GREEN STERILE FF (TOWEL DISPOSABLE) ×8 IMPLANT
TRAY ENT MC OR (CUSTOM PROCEDURE TRAY) ×4 IMPLANT
TUBE CONNECTING 12'X1/4 (SUCTIONS) ×1
TUBE CONNECTING 12X1/4 (SUCTIONS) ×3 IMPLANT
WATER STERILE IRR 1000ML POUR (IV SOLUTION) ×4 IMPLANT
YANKAUER SUCT BULB TIP NO VENT (SUCTIONS) ×4 IMPLANT

## 2018-09-27 NOTE — OR Nursing (Cosign Needed)
Obturator taped to Head of bed by Leeann Must, RN prior to going to PACU.

## 2018-09-27 NOTE — Anesthesia Procedure Notes (Signed)
Procedure Name: MAC Date/Time: 09/27/2018 1:15 AM Performed by: Alain Marion, CRNA Pre-anesthesia Checklist: Patient identified, Emergency Drugs available, Suction available and Patient being monitored Patient Re-evaluated:Patient Re-evaluated prior to induction Oxygen Delivery Method: Nasal cannula Placement Confirmation: positive ETCO2

## 2018-09-27 NOTE — Anesthesia Postprocedure Evaluation (Signed)
Anesthesia Post Note  Patient: Logan Price  Procedure(s) Performed: MICRO DIRECT LARYNGOSCOPY WITH BIOPSY (N/A Mouth) AWAKE TRACHEOSTOMY (N/A Neck)     Patient location during evaluation: PACU Anesthesia Type: General Level of consciousness: awake and alert Pain management: pain level controlled Vital Signs Assessment: post-procedure vital signs reviewed and stable Respiratory status: spontaneous breathing, nonlabored ventilation, respiratory function stable and patient connected to tracheostomy mask oxygen (s/p tracheostomy) Cardiovascular status: blood pressure returned to baseline and stable Postop Assessment: no apparent nausea or vomiting Anesthetic complications: no    Last Vitals:  Vitals:   09/27/18 1614 09/27/18 1700  BP:  140/73  Pulse: 87 70  Resp: 17 19  Temp: (!) 36.3 C   SpO2: 98% 97%    Last Pain:  Vitals:   09/27/18 1600  TempSrc:   PainSc: 0-No pain                 Catalina Gravel

## 2018-09-27 NOTE — Anesthesia Preprocedure Evaluation (Addendum)
Anesthesia Evaluation  Patient identified by MRN, date of birth, ID band Patient awake    Reviewed: Allergy & Precautions, NPO status , Patient's Chart, lab work & pertinent test results  Airway Mallampati: II  TM Distance: >3 FB Neck ROM: Full    Dental  (+) Dental Advisory Given, Missing   Pulmonary former smoker,    + rhonchi        Cardiovascular hypertension, Pt. on medications Normal cardiovascular exam Rhythm:Regular Rate:Normal     Neuro/Psych negative neurological ROS  negative psych ROS   GI/Hepatic Neg liver ROS, GERD  Medicated,  Endo/Other  negative endocrine ROS  Renal/GU negative Renal ROS     Musculoskeletal negative musculoskeletal ROS (+)   Abdominal   Peds  Hematology negative hematology ROS (+)   Anesthesia Other Findings Day of surgery medications reviewed with the patient.  Laryngeal Mass--stridor  Reproductive/Obstetrics                            Anesthesia Physical Anesthesia Plan  ASA: III  Anesthesia Plan: General   Post-op Pain Management:    Induction: Intravenous and Inhalational  PONV Risk Score and Plan: 3 and Midazolam, Dexamethasone and Ondansetron  Airway Management Planned: Tracheostomy  Additional Equipment:   Intra-op Plan:   Post-operative Plan:   Informed Consent: I have reviewed the patients History and Physical, chart, labs and discussed the procedure including the risks, benefits and alternatives for the proposed anesthesia with the patient or authorized representative who has indicated his/her understanding and acceptance.     Dental advisory given  Plan Discussed with: CRNA  Anesthesia Plan Comments:         Anesthesia Quick Evaluation

## 2018-09-27 NOTE — Op Note (Signed)
DATE OF PROCEDURE:  09/27/2018                              OPERATIVE REPORT  SURGEON:  Leta Baptist, MD  PREOPERATIVE DIAGNOSES: 1. Laryngeal mass. 2. Airway obstruction.  POSTOPERATIVE DIAGNOSES: 1. Laryngeal mass. 2. Airway obstruction.  PROCEDURE PERFORMED:   1. Awake tracheostomy 2. Microdirect laryngoscopy and biopsy  ANESTHESIA:  General endotracheal tube anesthesia.  COMPLICATIONS:  None.  ESTIMATED BLOOD LOSS:  61ml  INDICATION FOR PROCEDURE:  Logan Price is a 73 y.o. male who was recently noted to have a large laryngeal and right pyriform mass. A portion of the mass was noted to obstruct the glottic opening. He was experiencing increasing breathing difficulty. Based on the above findings, the decision was made for the patient to undergo the above-stated procedures.  The risks, benefits, alternatives, and details of the procedure were discussed with the patient.  Questions were invited and answered.  Informed consent was obtained.  DESCRIPTION:  The patient was taken to the operating room and placed supine on the operating table.  The patient was positioned and prepped and draped in the standard fashion for tracheostomy tube placement.  IV sedation was administered by the anesthesiologist.  1% lidocaine with 1-100,000 epinephrine was injected at the anterior neck. A 2 cm vertical incision was made in the anterior necks, at the level slightly below the cricoid bone. The incision was carried down past the level of the platysma muscle. The strep muscles were identified and divided in midline. They were retracted laterally, exposing the thyroid gland. The thyroid was divided at midline and retracted laterally. The anterior tracheal wall was exposed. A tracheal window was made at the level of the second tracheal ring. The endotracheal tube was withdrawn. A # 6 cuffed Shiley tracheostomy tube was placed without difficulty. Good end tidal volume and CO2 return was noted. The tracheostomy tube  was secured in place with 2-0 Prolene sutures and circumferential necktie.   The patient was repositioned for direct laryngoscopy.  A Dedo laryngoscope was inserted via the oral cavity into the pharynx.  A large laryngeal mass was noted.  The mass was noted to obstruct the glottic opening.  The mass extends into the right piriform sinus.  Photodocumentation was obtained.  The Dedo laryngoscope was suspended with the Lewy suspender.  Under the guidance of a 0 degree rigid endoscope, multiple biopsy specimens were obtained from the laryngeal mass.  The specimens were sent to the pathology department for permanent histologic identification.  Hemostasis was achieved with pledgets soaked with epinephrine.  The Dedo laryngoscope was withdrawn.  The care of the patient was transferred to the anesthesiologist. The patient was awakened from anesthesia without difficulty. He was transferred to the intensive care unit in stable condition.  OPERATIVE FINDINGS: A large laryngeal mass was noted.  The mass was noted to obstruct the glottic opening.  The mass was also noted to fill the right piriform sinus.  A # 6 cuffed Shiley tracheostomy tube was placed without difficulty.  SPECIMEN: Laryngeal mass biopsy specimens.  FOLLOWUP CARE:  The patient will be admitted to the ICU.   Kebra Lowrimore W Kaien Pezzullo 09/27/2018 12:15 PM

## 2018-09-27 NOTE — Transfer of Care (Signed)
Immediate Anesthesia Transfer of Care Note  Patient: Logan Price  Procedure(s) Performed: MICRO DIRECT LARYNGOSCOPY WITH BIOPSY (N/A Mouth) AWAKE TRACHEOSTOMY (N/A Neck)  Patient Location: PACU  Anesthesia Type:General  Level of Consciousness: awake, alert  and oriented  Airway & Oxygen Therapy: Patient Spontanous Breathing and Patient connected to tracheostomy mask oxygen  Post-op Assessment: Report given to RN and Post -op Vital signs reviewed and stable  Post vital signs: Reviewed and stable  Last Vitals:  Vitals Value Taken Time  BP 93/56 09/27/18 1216  Temp    Pulse 66 09/27/18 1219  Resp 16 09/27/18 1219  SpO2 100 % 09/27/18 1219  Vitals shown include unvalidated device data.  Last Pain:  Vitals:   09/27/18 0848  TempSrc: Oral         Complications: No apparent anesthesia complications

## 2018-09-27 NOTE — H&P (Signed)
Cc: Throat pain, dysphagia, laryngeal mass  HPI: The patient is a 73 year old male who returns today for his follow-up evaluation. The patient has a history of chronic globus sensation and chronic cough. He was previously noted to have significant posterior laryngeal edema and erythema. The patient was last seen in November. He was noted to have diffuse fullness in the piriform sinus. Neck CT was obtained which showed no evidence of mass or lesion. The patient has noted persistent symptoms. He recently underwent a barium swallow study which showed a large right pyriform mass. The patient has noted progressive symptoms over the past several months with onset of difficulty breathing. He wife states he has lost a lot of weight secondary to difficulty eating. The patient quit smoking over 10 years ago.   Exam: The flexible scope was inserted into the right nasal cavity and advanced towards the nasopharynx. Visualized mucosa over the turbinates and septum were normal. The nasopharynx was clear. Oropharyngeal walls were symmetric and mobile without lesion, mass, or edema. Hypopharynx with ulcerative mass noted at the right pyriform sinus. Larynx has a mass obstructing the laryngeal opening. Arytenoid mucosa was moderately edematous. True vocal folds could not be visualized. Base of tongue was within normal limits. The patient tolerated the procedure well.   Assessment: 1. 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   Plan: 1. Laryngoscopy findings are reviewed with the patient and his wife.  2. Recommend urgent micro direct laryngoscopy with biopsy and possible awake tracheostomy. The risks, benefits, details, and alternatives of the procedures are discussed with the patient. Questions are invited and answered. 3. The procedure will be scheduled ASAP.

## 2018-09-28 ENCOUNTER — Inpatient Hospital Stay (HOSPITAL_COMMUNITY): Payer: Medicare Other

## 2018-09-28 ENCOUNTER — Encounter (HOSPITAL_COMMUNITY): Payer: Self-pay | Admitting: Otolaryngology

## 2018-09-28 DIAGNOSIS — R1312 Dysphagia, oropharyngeal phase: Secondary | ICD-10-CM

## 2018-09-28 DIAGNOSIS — C320 Malignant neoplasm of glottis: Secondary | ICD-10-CM

## 2018-09-28 MED ORDER — IOHEXOL 300 MG/ML  SOLN
75.0000 mL | Freq: Once | INTRAMUSCULAR | Status: AC | PRN
  Administered 2018-09-28: 75 mL via INTRAVENOUS

## 2018-09-28 NOTE — Progress Notes (Signed)
Subjective: No issues overnight. On trach collar.  Objective: Vital signs in last 24 hours: Temp:  [97 F (36.1 C)-97.8 F (36.6 C)] 97.8 F (36.6 C) (07/04 1056) Pulse Rate:  [58-124] 110 (07/04 1117) Resp:  [14-34] 18 (07/04 1117) BP: (92-163)/(48-92) 163/81 (07/04 1117) SpO2:  [93 %-100 %] 100 % (07/04 1117) FiO2 (%):  [28 %] 28 % (07/04 1117) Weight:  [60.5 kg] 60.5 kg (07/04 0600)  General appearance: alert, cooperative and no distress Head: Normocephalic, without obvious abnormality, atraumatic Eyes: conjunctivae/corneas clear. PERRL, EOM's intact. Fundi benign. Ears: normal TM's and external ear canals both ears Nose: Nares normal. Septum midline. Mucosa normal. No drainage or sinus tenderness. Throat: lips, mucosa, and tongue normal; teeth and gums normal Neck: Trach midline. No bleeding. Neurologic: Grossly normal  Recent Labs    09/27/18 0748  WBC 8.7  HGB 14.2  HCT 43.6  PLT 329   Recent Labs    09/27/18 0748  NA 139  K 3.9  CL 100  CO2 30  GLUCOSE 112*  BUN 11  CREATININE 0.92  CALCIUM 9.4    Medications:  I have reviewed the patient's current medications. Scheduled: . chlorhexidine  15 mL Mouth Rinse BID  . mouth rinse  15 mL Mouth Rinse q12n4p   Continuous: . dextrose 5 % and 0.45 % NaCl with KCl 20 mEq/L 100 mL/hr at 09/28/18 1100    Assessment/Plan: POD #1 s/p trach and laryngeal biopsy.  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 - Will get a neck CT scan. - Transfer to step down. Lurline Idol care teaching.   LOS: 1 day   Darielle Hancher W Keone Kamer 09/28/2018, 11:35 AM

## 2018-09-28 NOTE — Progress Notes (Signed)
Pt received from 2h. Pt has trach collar @ 5 lpm 28% FIO2. CHG bath given. Telebox [id number] applied/CCMD notified. Pt suctioned immediately. Pt denies any complaints. Vitals stable. Pt call bell within reach. Will continue to monitor  Jerald Kief, RN

## 2018-09-29 ENCOUNTER — Inpatient Hospital Stay (HOSPITAL_COMMUNITY): Payer: Medicare Other

## 2018-09-29 DIAGNOSIS — R1312 Dysphagia, oropharyngeal phase: Secondary | ICD-10-CM

## 2018-09-29 DIAGNOSIS — C320 Malignant neoplasm of glottis: Secondary | ICD-10-CM

## 2018-09-29 MED ORDER — IOHEXOL 300 MG/ML  SOLN
75.0000 mL | Freq: Once | INTRAMUSCULAR | Status: AC | PRN
  Administered 2018-09-29: 75 mL via INTRAVENOUS

## 2018-09-29 NOTE — Plan of Care (Signed)
Poc progressing.  

## 2018-09-29 NOTE — Progress Notes (Addendum)
Subjective: No issues overnight. On trach collar.  Objective: Vital signs in last 24 hours: Temp:  [98 F (36.7 C)-98.4 F (36.9 C)] 98 F (36.7 C) (07/05 1057) Pulse Rate:  [63-126] 84 (07/05 1057) Resp:  [11-99] 20 (07/05 1057) BP: (128-156)/(66-107) 156/82 (07/05 1057) SpO2:  [91 %-100 %] 96 % (07/05 1057) FiO2 (%):  [28 %] 28 % (07/05 0823)  General appearance: alert, cooperative and no distress Head: Normocephalic, without obvious abnormality, atraumatic Eyes: conjunctivae/corneas clear. PERRL, EOM's intact.  Ears: normal TM's and external ear canals both ears Nose: Nares normal. Septum midline. Mucosa normal. No drainage or sinus tenderness. Throat: lips, mucosa, and tongue normal; teeth and gums normal Neck: Trach midline. No bleeding. Neurologic: Grossly normal  Recent Labs    09/27/18 0748  WBC 8.7  HGB 14.2  HCT 43.6  PLT 329   Recent Labs    09/27/18 0748  NA 139  K 3.9  CL 100  CO2 30  GLUCOSE 112*  BUN 11  CREATININE 0.92  CALCIUM 9.4    Medications:  I have reviewed the patient's current medications. Scheduled: . chlorhexidine  15 mL Mouth Rinse BID  . mouth rinse  15 mL Mouth Rinse q12n4p   Continuous: . dextrose 5 % and 0.45 % NaCl with KCl 20 mEq/L 100 mL/hr at 09/29/18 1131    Assessment/Plan: POD #2 s/p trach and laryngeal biopsy.  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 - Will change trach to uncuffed Shiley tomorrow. Lurline Idol care teaching. - Oncology consult.  CT Chest per oncology.   LOS: 2 days   Roel Douthat W Rocco Kerkhoff 09/29/2018, 11:31 AM

## 2018-09-30 ENCOUNTER — Encounter (HOSPITAL_COMMUNITY): Payer: Self-pay | Admitting: Oncology

## 2018-09-30 DIAGNOSIS — R1312 Dysphagia, oropharyngeal phase: Secondary | ICD-10-CM

## 2018-09-30 DIAGNOSIS — C329 Malignant neoplasm of larynx, unspecified: Secondary | ICD-10-CM

## 2018-09-30 DIAGNOSIS — C320 Malignant neoplasm of glottis: Secondary | ICD-10-CM

## 2018-09-30 DIAGNOSIS — Z7189 Other specified counseling: Secondary | ICD-10-CM

## 2018-09-30 DIAGNOSIS — R634 Abnormal weight loss: Secondary | ICD-10-CM

## 2018-09-30 DIAGNOSIS — R1310 Dysphagia, unspecified: Secondary | ICD-10-CM

## 2018-09-30 NOTE — Evaluation (Signed)
Passy-Muir Speaking Valve - Evaluation Patient Details  Name: Logan Price MRN: [id number] Date of Birth: 08-02-1945  Today's Date: 09/30/2018 Time: 1158-1210 SLP Time Calculation (min) (ACUTE ONLY): 12 min  Past Medical History:  Past Medical History:  Diagnosis Date  . Diverticulitis   . 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 GERD (gastroesophageal reflux disease)   . Hepatitis C   . HTN (hypertension)   . Hyperlipidemia    Past Surgical History:  Past Surgical History:  Procedure Laterality Date  . BIOPSY  05/04/2015   Procedure: BIOPSY;  Surgeon: West Bali, MD;  [facility];  Service: Endoscopy;;  bx's of ileocecal valve   . COLONOSCOPY WITH PROPOFOL N/A 05/04/2015   Dr. Darrick Penna: normal appearing ileum with prominent IC valve with tubular adenomas, moderate diverticulosis in sigmoid colon, ascending colon, and retum. Moderate sized internal hemorrhoids. Surveillance in 5 years  . ESOPHAGOGASTRODUODENOSCOPY (EGD) WITH PROPOFOL N/A 12/19/2016   Procedure: ESOPHAGOGASTRODUODENOSCOPY (EGD) WITH PROPOFOL;  Surgeon: West Bali, MD;  [facility]: [facility];  Service: Endoscopy;  Laterality: N/A;  11:30am  . FLEXIBLE SIGMOIDOSCOPY N/A 12/10/2015   hemorrhoid banding X 3   . HEMORRHOID BANDING N/A 12/10/2015   Procedure: HEMORRHOID BANDING;  Surgeon: West Bali, MD;  [facility];  Service: Endoscopy;  Laterality: N/A;  1:30 PM  . MICROLARYNGOSCOPY N/A 09/27/2018   Procedure: MICRO DIRECT LARYNGOSCOPY WITH BIOPSY;  Surgeon: Newman Pies, MD;  Location: [facility];  Service: ENT;  Laterality: N/A;  . None to date     As of 04/14/15  . POLYPECTOMY  05/04/2015   Procedure: POLYPECTOMY;  Surgeon: West Bali, MD;  [facility];  Service: Endoscopy;;  descending colon polyp, ascending colon polyp  . SAVORY DILATION N/A 12/19/2016   Procedure: SAVORY DILATION;  Surgeon: West Bali, MD;  [facility];  Service: Endoscopy;   Laterality: N/A;  . TRACHEOSTOMY TUBE PLACEMENT N/A 09/27/2018   Procedure: AWAKE TRACHEOSTOMY;  Surgeon: Newman Pies, MD;  Location: [facility] OR;  Service: ENT;  Laterality: N/A;   HPI:  Pt is a 73 yo male admitted for planned trach and laryngeal biopsy 7/2. 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 Esophagram 6/26 showed silent gross aspiration of barium. PMH includes: HLD, HTN, hepatitis C, GERD, diverticulitis   Assessment / Plan / Recommendation Clinical Impression  Pt tolerated PMV placement for over 20 minutes with voice production likely impacted by mass in his laryngeal/pharyngeal space. Phonation is soft, hoarse, and intermittently wet. Min cues were provided to improve intelligibility at the conversational level. PMV is likely helpful for him for secretion management, but he has not yet been trained in how to don/doff the valve himself yet. Recommend using PMV intermittently throughout the day when full supervision can be provided in order to facilitate communication and secretion management. SLP will f/u for additional training on how to utilize and maintain valve more independently. Will try to coordinate this with times that his wife is here for training as well. SLP Visit Diagnosis: Aphonia (R49.1)    SLP Assessment  Patient needs continued Speech Lanaguage Pathology Services    Follow Up Recommendations  Outpatient SLP    Frequency and Duration min 2x/week  2 weeks    PMSV Trial PMSV was placed for: 22 min Able to redirect subglottic air through upper airway: Yes Able to Attain Phonation: Yes Voice Quality: Hoarse;Wet;Low  vocal intensity Able to Expectorate Secretions: Yes Level of Secretion Expectoration with PMSV: Oral Breath Support for Phonation: Mildly decreased Intelligibility: Intelligibility reduced Conversation: 75-100% accurate Respirations During Trial: (WFL) SpO2 During Trial: (low 90s at baseline  and throughout trials) Pulse During Trial: [facility]) Behavior: Alert;Controlled;Cooperative   Tracheostomy Tube       Vent Dependency  FiO2 (%): 28 %    Cuff Deflation Trial  GO Tolerated Cuff Deflation: (cuffless trach)        Virl Axe Louanne Calvillo 09/30/2018, 1:27 PM   Ivar Drape, M.A. CCC-SLP Acute Herbalist [phone] Office [phone]

## 2018-09-30 NOTE — Progress Notes (Signed)
Subjective: No issues overnight.   Objective: Vital signs in last 24 hours: Temp:  [97.7 F (36.5 C)-98.5 F (36.9 C)] 97.7 F (36.5 C) (07/06 0740) Pulse Rate:  [73-105] 73 (07/06 0810) Resp:  [18-29] 18 (07/06 0810) BP: (124-156)/(63-82) 138/80 (07/06 0810) SpO2:  [95 %-97 %] 97 % (07/06 0810) FiO2 (%):  [28 %] 28 % (07/06 0810)  General appearance:alert, cooperative and no distress Head:Normocephalic, without obvious abnormality, atraumatic Eyes:conjunctivae/corneas clear. PERRL, EOM's intact.  Ears:normal TM's and external ear canals both ears Nose:Nares normal. Septum midline. Mucosa normal. No drainage or sinus tenderness. Throat:lips, mucosa, and tongue normal; teeth and gums normal Neck:Trach midline. No bleeding. Neurologic:Grossly normal  Medications:  I have reviewed the patient's current medications. Scheduled: . chlorhexidine  15 mL Mouth Rinse BID  . mouth rinse  15 mL Mouth Rinse q12n4p   Continuous: . dextrose 5 % and 0.45 % NaCl with KCl 20 mEq/L 100 mL/hr at 09/29/18 1146    Assessment/Plan: POD #3 s/p trach and laryngeal biopsy.  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 Awaiting path results. 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 - Trach changed to an uncuffed #6 Shiley. Lurline Idol care teaching. - Oncology consulted. Chest CT showed no metastatic disease. - SLP to evaluate for swallowing and passy muir teaching.   LOS: 3 days   Logan Price W Logan Price 09/30/2018, 10:23 AM

## 2018-09-30 NOTE — Care Management Important Message (Signed)
Important Message  Patient Details  Name: Logan Price MRN: [id number] Date of Birth: Jul 21, 1945   Medicare Important Message Given:  Yes     [place], [place] 09/30/2018, 1:26 PM

## 2018-09-30 NOTE — Evaluation (Signed)
Clinical/Bedside Swallow Evaluation Patient Details  Name: Logan Price MRN: [id number] Date of Birth: [birth year]  Today's Date: 09/30/2018 Time: SLP Start Time (ACUTE ONLY): 1210 SLP Stop Time (ACUTE ONLY): 1227 SLP Time Calculation (min) (ACUTE ONLY): 17 min  Past Medical History:  Past Medical History:  Diagnosis Date  . Diverticulitis   . 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 GERD (gastroesophageal reflux disease)   . Hepatitis C   . HTN (hypertension)   . Hyperlipidemia    Past Surgical History:  Past Surgical History:  Procedure Laterality Date  . BIOPSY  05/04/2015   Procedure: BIOPSY;  Surgeon: West Bali, MD;  [facility];  Service: Endoscopy;;  bx's of ileocecal valve   . COLONOSCOPY WITH PROPOFOL N/A 05/04/2015   Dr. Darrick Penna: normal appearing ileum with prominent IC valve with tubular adenomas, moderate diverticulosis in sigmoid colon, ascending colon, and retum. Moderate sized internal hemorrhoids. Surveillance in 5 years  . ESOPHAGOGASTRODUODENOSCOPY (EGD) WITH PROPOFOL N/A 12/19/2016   Procedure: ESOPHAGOGASTRODUODENOSCOPY (EGD) WITH PROPOFOL;  Surgeon: West Bali, MD;  [facility];  Service: Endoscopy;  Laterality: N/A;  11:30am  . FLEXIBLE SIGMOIDOSCOPY N/A 12/10/2015   hemorrhoid banding X 3   . HEMORRHOID BANDING N/A 12/10/2015   Procedure: HEMORRHOID BANDING;  Surgeon: West Bali, MD;  [facility];  Service: Endoscopy;  Laterality: N/A;  1:30 PM  . MICROLARYNGOSCOPY N/A 09/27/2018   Procedure: MICRO DIRECT LARYNGOSCOPY WITH BIOPSY;  Surgeon: Newman Pies, MD;  Location: [facility] OR;  Service: ENT;  Laterality: N/A;  . None to date     As of 04/14/15  . POLYPECTOMY  05/04/2015   Procedure: POLYPECTOMY;  Surgeon: West Bali, MD;  [facility];  Service: Endoscopy;;  descending colon polyp, ascending colon polyp  . SAVORY DILATION N/A 12/19/2016   Procedure: SAVORY DILATION;  Surgeon: West Bali,  MD;  [facility]: [facility];  Service: Endoscopy;  Laterality: N/A;  . TRACHEOSTOMY TUBE PLACEMENT N/A 09/27/2018   Procedure: AWAKE TRACHEOSTOMY;  Surgeon: Newman Pies, MD;  Location: [facility] OR;  Service: ENT;  Laterality: N/A;   HPI:  Pt is a 73 yo male admitted for planned trach and laryngeal biopsy 7/2. 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 Esophagram 6/26 showed silent gross aspiration of barium. PMH includes: HLD, HTN, hepatitis C, GERD, diverticulitis   Assessment / Plan / Recommendation Clinical Impression  Pt is at a high risk for aspiration in the setting of laryngeal/pharyngeal/esophageal mass. Esophagram completed last month as an outpatient showed gross silent aspiration of barium contrast. He shares with me that he has had increasing difficulty swallowing PTA, progressively eating less. He endorses an approximately 20 pound weight loss. Oral motor exam was [facility] but POs were not given due to difficulty with secretion management today. SLP provided education and demonstration for use of yankauer, which pt then used with Min cues to expectorate and orally suction secretions. Pt was educated about aspiration risk in the setting of his mass and the use of PMV to aid in cough/airway protection. He will likely benefit from dedicated oropharyngeal swallow study, but would focus initially on secretions. Pending results of biopsy, potential treatment options, and overall GOC, may want to consider alternative means of nutrition. SLP will continue to follow. SLP Visit Diagnosis: Dysphagia, unspecified (R13.10)    Aspiration Risk  Severe aspiration risk;Risk for inadequate nutrition/hydration  Diet Recommendation NPO   Medication Administration: Via alternative means    Other  Recommendations Oral Care Recommendations: Oral care QID Other Recommendations: Have oral suction available   Follow up Recommendations Outpatient SLP       Frequency and Duration min 2x/week  2 weeks       Prognosis Prognosis for Safe Diet Advancement: Fair Barriers to Reach Goals: Severity of deficits      Swallow Study   General HPI: Pt is a 73 yo male admitted for planned trach and laryngeal biopsy 7/2. 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 Esophagram 6/26 showed silent gross aspiration of barium. PMH includes: HLD, HTN, hepatitis C, GERD, diverticulitis Type of Study: Bedside Swallow Evaluation Previous Swallow Assessment: none by SLP per chart - see HPI for esophagram Diet Prior to this Study: NPO Temperature Spikes Noted: No Respiratory Status: Trach Trach Size and Type: Uncuffed;#6;With PMSV in place History of Recent Intubation: No Behavior/Cognition: Alert;Cooperative;Pleasant mood Oral Cavity Assessment: Within Functional Limits Oral Care Completed by SLP: No Oral Cavity - Dentition: Adequate natural dentition Patient Positioning: Upright in bed Baseline Vocal Quality: Hoarse;Low vocal intensity;Wet Volitional Cough: Strong Volitional Swallow: Able to elicit    Oral/Motor/Sensory Function Overall Oral Motor/Sensory Function: Within functional limits   Ice Chips Ice chips: Not tested   Thin Liquid Thin Liquid: Not tested    Nectar Thick Nectar Thick Liquid: Not tested   Honey Thick Honey Thick Liquid: Not tested   Puree Puree: Not tested   Solid     Solid: Not tested      Virl Axe Kennen Stammer 09/30/2018,1:41 PM  Ivar Drape, M.A. CCC-SLP Acute Herbalist [phone] Office [phone]

## 2018-09-30 NOTE — Progress Notes (Signed)
Trach care completed. Updated pt wife, Dedra Skeens, via phone with patient permission. Alma agreed to be at [facility] at 1000 10/01/18 for education with the trach team. RRT notified.  Clyde Canterbury, RN

## 2018-09-30 NOTE — Consult Note (Signed)
Logan Price  [phone] [phone]   MEDICAL ONCOLOGY - INITIAL CONSULTATION  Referral MD: Dr. 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  He underwent this procedure on 09/27/2018.  A biopsy of the mass was obtained and is currently pending.  The patient reports a 2-year history of difficulty swallowing.  He reports anorexia and a 20 pound weight loss over the past month.  He reports a lump in his right neck.  He denies dizziness, headaches, vision changes.  He denies chest discomfort and shortness of breath.  He has an intermittent cough.  No hemoptysis.  He denies abdominal pain, nausea, vomiting, constipation, diarrhea.  Denies bleeding.  The patient has a pack-year history of tobacco use.  He quit in 2008.  He reports ongoing alcohol use.  Medical oncology was asked see the patient to make recommendations regarding his laryngeal mass.   Past Medical History:  Diagnosis Date  . Diverticulitis   . 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 GERD (gastroesophageal reflux disease)   . Hepatitis C   . HTN (hypertension)   . Hyperlipidemia   :  Past Surgical History:  Procedure Laterality Date  . BIOPSY  05/04/2015   Procedure: BIOPSY;  Surgeon: Danie Binder, MD;  Location: [facility];  Service: Endoscopy;;  bx's of ileocecal valve   . COLONOSCOPY WITH PROPOFOL N/A 05/04/2015   Dr. Oneida Alar: normal appearing ileum with prominent IC valve with tubular adenomas, moderate diverticulosis in sigmoid colon, ascending colon, and retum. Moderate sized internal  hemorrhoids. Surveillance in 5 years  . ESOPHAGOGASTRODUODENOSCOPY (EGD) WITH PROPOFOL N/A 12/19/2016   Procedure: ESOPHAGOGASTRODUODENOSCOPY (EGD) WITH PROPOFOL;  Surgeon: Danie Binder, MD;  Location: [facility];  Service: Endoscopy;  Laterality: N/A;  11:30am  . FLEXIBLE SIGMOIDOSCOPY N/A 12/10/2015   hemorrhoid banding X 3   . HEMORRHOID BANDING N/A 12/10/2015   Procedure: HEMORRHOID BANDING;  Surgeon: Danie Binder, MD;  Location: [facility];  Service: Endoscopy;  Laterality: N/A;  1:30 PM  . MICROLARYNGOSCOPY N/A 09/27/2018   Procedure: MICRO DIRECT LARYNGOSCOPY WITH BIOPSY;  Surgeon: Leta Baptist, MD;  Location: [facility];  Service: ENT;  Laterality: N/A;  . None to date     As of 04/14/15  . POLYPECTOMY  05/04/2015   Procedure: POLYPECTOMY;  Surgeon: Danie Binder, MD;  Location: [facility];  Service: Endoscopy;;  descending colon polyp, ascending colon polyp  . SAVORY DILATION N/A 12/19/2016   Procedure: SAVORY DILATION;  Surgeon: Danie Binder, MD;  Location: [facility];  Service: Endoscopy;  Laterality: N/A;  . TRACHEOSTOMY TUBE PLACEMENT N/A 09/27/2018   Procedure: AWAKE TRACHEOSTOMY;  Surgeon: Leta Baptist, MD;  Location: [facility];  Service: ENT;  Laterality: N/A;  :  Current Facility-Administered Medications  Medication Dose Route Frequency Provider Last Rate Last Dose  . chlorhexidine (PERIDEX) 0.12 % solution 15 mL  15 mL Mouth Rinse BID Benjamine Mola, Su, MD   15 mL at 09/30/18 1101  . dextrose 5 % and 0.45 % NaCl with KCl 20 mEq/L infusion   Intravenous Continuous Leta Baptist, MD  100 mL/hr at 09/30/18 1101    . MEDLINE mouth rinse  15 mL Mouth Rinse q12n4p Leta Baptist, MD   15 mL at 09/30/18 1251  . morphine 2 MG/ML injection 2-4 mg  2-4 mg Intravenous Q2H PRN Leta Baptist, MD         No Known Allergies:  Family History  Problem Relation Age of Onset  . Cancer Mother   . Hypertension Father   . Hypertension Sister   . Hypertension Brother   . Hypertension Sister   . Aneurysm Brother 22        brain  . Colon cancer Neg Hx   . Colon polyps Neg Hx   :  Social History   Socioeconomic History  . Marital status: Married    Spouse name: Not on file  . Number of children: Not on file  . Years of education: GED  . Highest education level: GED or equivalent  Occupational History  . Occupation: Retired    Comment: [employer]  Social Needs  . Financial resource strain: Not hard at all  . Food insecurity    Worry: Never true    Inability: Never true  . Transportation needs    Medical: No    Non-medical: No  Tobacco Use  . Smoking status: Former Smoker    Packs/day: 1.00    Years: 40.00    Pack years: 40.00    Types: Cigarettes    Quit date: 10/09/2006    Years since quitting: 11.9  . Smokeless tobacco: Never Used  . Tobacco comment: quit x 9 years  Substance and Sexual Activity  . Alcohol use: Yes    Alcohol/week: 0.0 standard drinks    Comment: 7 oz of vodka per night  . Drug use: No  . Sexual activity: Yes    Birth control/protection: None  Lifestyle  . Physical activity    Days per week: 0 days    Minutes per session: 0 min  . Stress: Only a little  Relationships  . Social connections    Talks on phone: More than three times a week    Gets together: More than three times a week    Attends religious service: Never    Active member of club or organization: No    Attends meetings of clubs or organizations: Never    Relationship status: Married  . Intimate partner violence    Fear of current or ex partner: No    Emotionally abused: No    Physically abused: No    Forced sexual activity: No  Other Topics Concern  . Not on file  Social History Narrative  . Not on file  : Review of Systems: A comprehensive 14 point review of systems was negative except as noted in the HPI.  Exam: Patient Vitals for the past 24 hrs:  BP Temp Temp src Pulse Resp SpO2  09/30/18 1301 140/78 98.4 F (36.9 C) Oral (!) 106 20 97 %  09/30/18 1123 - - -  (!) 102 18 94 %  09/30/18 0810 138/80 - - 73 18 97 %  09/30/18 0740 138/80 97.7 F (36.5 C) Oral 94 20 97 %  09/30/18 0334 124/63 97.7 F (36.5 C) Oral 91 (!) 23 97 %  09/30/18 0315 - - - 85 20 97 %  09/29/18 2302 - - - 100 (!) 22 95 %  09/29/18 1957 (!) 142/71 98.5 F (36.9 C) Oral 90 (!) 24 97 %  09/29/18 1925 - - - Marland Kitchen)  105 (!) 29 97 %  09/29/18 1639 - - - - 18 -    General: Alert, no acute distress.   Eyes:  no scleral icterus.   ENT:  There were no oropharyngeal lesions.   Neck: Tracheostomy midline. Lymphatics: Palpable right cervical lymph node measuring approximately 2 cm in diameter.   Respiratory: lungs were clear bilaterally without wheezing or crackles.  Cardiovascular:  Regular rate and rhythm, S1/S2, without murmur, rub or gallop.  There was no pedal edema.   GI:  abdomen was soft, flat, nontender, nondistended, without organomegaly.  Muscoloskeletal:  no spinal tenderness of palpation of vertebral spine.   Skin exam was without echymosis, petichae.   Neuro exam was nonfocal. Patient was alert and oriented.  Attention was good.   Language was appropriate.  Mood was normal without depression. Thought content was not tangential.     Lab Results  Component Value Date   WBC 8.7 09/27/2018   HGB 14.2 09/27/2018   HCT 43.6 09/27/2018   PLT 329 09/27/2018   GLUCOSE 112 (H) 09/27/2018   CHOL 163 04/25/2018   TRIG 86 04/25/2018   HDL 83 04/25/2018   LDLCALC 63 04/25/2018   ALT 16 04/25/2018   AST 21 04/25/2018   NA 139 09/27/2018   K 3.9 09/27/2018   CL 100 09/27/2018   CREATININE 0.92 09/27/2018   BUN 11 09/27/2018   CO2 30 09/27/2018    Ct Soft Tissue Neck W Contrast  Result Date: 09/28/2018 CLINICAL DATA:  73 year old male status post tracheostomy and laryngeal biopsy postoperative day 1. Findings suspicious for squamous cell carcinoma. EXAM: CT NECK WITH CONTRAST TECHNIQUE: Multidetector CT imaging of the neck was performed using the standard protocol  following the bolus administration of intravenous contrast. CONTRAST:  22mL OMNIPAQUE IOHEXOL 300 MG/ML  SOLN COMPARISON:  Neck CT 02/22/2018. FINDINGS: Pharynx and larynx: Tracheostomy has been placed, and there is a small volume of soft tissue gas tracking in the retropharyngeal space, in the bilateral neck, and into the visible mediastinum. The tube appears well positioned. There is debris in the cricoid above the tube. Trace retained secretions or debris in the trachea below the tube. There is an infiltrative and fungating heterogeneously enhancing mass now occupying the larynx, chiefly at and above the glottis, but also possibly infiltrating and involving the cervical esophagus below the glottis (series 3, image 80). There is extension through the right thyroid cartilage and contiguous appearing tumor - probably ex nodal disease - in the right lower neck lateral to the carotid space (series 3, image 64). There is associated narrowing of the right IJ at that level, which remains patent. All told, the poorly marginated hyperenhancing tumor encompasses 35 x 76 x 66 millimeters (AP by transverse by CC). See series 3, image 63, 64, 74, and coronal images 51 through 54. Tumor extends to the AE folds and the mucosa hypopharynx is generally hyperenhancing. The oropharynx and nasopharynx are spared. Negative superior parapharyngeal spaces, the right lower paralaryngeal spaces are abnormal. The retropharyngeal space contains gas from the tracheostomy. Salivary glands: Negative sublingual space. The submandibular glands and parotid glands remain within normal limits. Thyroid: Postoperative changes from tracheostomy, but the posterior right thyroid lobe also appears abutted by tumor on series 3, image 84. Lymph nodes: Extensive right level 3 Aks nodal disease suspected, resulting in a semi contiguous appearance of tumor from the level 3 station into the larynx. Malignant right level 2A lymph nodes measuring 16 millimeter  short axis on series  3, image 47. No level 1 node involvement. No abnormal level 4 nodes are evident. There are small but conspicuous left level 2 B (series 3, image 40) and level IIIb (image 61) lymph nodes which are larger than in 2019, up to 7 millimeters diameter. Vascular: Mass effect on the right internal jugular vein from tumor in the right lower neck, but the vein remains patent. Tumor surrounds the right lower carotid space but the right carotid remains patent with calcified atherosclerosis. Other major vascular structures in the neck and at the skull base are patent. Limited intracranial: Negative. Visualized orbits: Negative. Mastoids and visualized paranasal sinuses: Clear. Skeleton: Hyperenhancing tumor appears to occupy the prevertebral space in the lower cervical spine from C4-C5 through C7-T1, and probably is associated with cervical esophageal involvement. See series 3, image 80 and sagittal image 52. No bone erosion identified. No lytic or suspicious osseous lesion identified elsewhere. Upper chest: Centrilobular emphysema. Stable small right apical nodules on series 9, image 88, and left apically nodule on image 94, which are likely related to lung scarring. Small volume pneumomediastinum following tracheostomy tube placement. Distal to the tube the trachea appears patent with some retained secretions. No superior mediastinal lymphadenopathy. Calcified aortic atherosclerosis. IMPRESSION: 1. Infiltrative and poorly marginated enhancing tumor in the lower neck eccentric to the right appears to involve the larynx, the adjacent cervical esophagus, and is somewhat contiguous with ex-nodal disease in the right level 3 station, surrounding the right carotid space and narrowing the right IJ which remains patent. - all told tumor encompasses 35 x 76 x 66 mm (AP by transverse by CC) - prevertebral space involvement suspected C4-C5 through C7-T1 but no underlying bony changes. - malignant right level 2 nodal  disease, and small but suspicious contralateral left level 2 and left level 3 nodes. 2. Status post tracheostomy with small volume pneumomediastinum and soft tissue gas in the neck. The airway appears patent below the tracheostomy tube and no adverse features are identified. Electronically Signed   By: Genevie Ann M.D.   On: 09/28/2018 17:34   Ct Chest W Contrast  Result Date: 09/29/2018 CLINICAL DATA:  Neck mass, advanced neck cancer, status post tracheostomy. EXAM: CT CHEST WITH CONTRAST TECHNIQUE: Multidetector CT imaging of the chest was performed during intravenous contrast administration. CONTRAST:  68mL OMNIPAQUE IOHEXOL 300 MG/ML  SOLN COMPARISON:  09/28/2018 neck CT. FINDINGS: Cardiovascular: Normal heart size. No significant pericardial effusion/thickening. Three-vessel coronary atherosclerosis. Atherosclerotic nonaneurysmal thoracic aorta. Normal caliber pulmonary arteries. No central pulmonary emboli. Mediastinum/Nodes: No discrete thyroid nodules. Normal thoracic esophagus. Infiltrative right neck mass involving larynx and cervical esophagus, as detailed on neck CT from 1 day prior. Subcutaneous emphysema scattered in the lower neck bilaterally. Scattered pneumomediastinum throughout the bilateral paratracheal and paraesophageal regions. No pathologically enlarged axillary, mediastinal or hilar lymph nodes. Lungs/Pleura: No pneumothorax. No pleural effusion. Moderate centrilobular emphysema with diffuse bronchial wall thickening. Tracheostomy tube tip is in the tracheal lumen just below thoracic inlet. No acute consolidative airspace disease or lung masses. Scattered calcified granulomas throughout the medial right lower lobe. A few scattered solid pulmonary nodules in the upper lobes bilaterally, predominantly at the lung apices, largest 6 mm at the apical left upper lobe (series 5/image 17) and 5 mm in the apical right upper lobe (series 5/image 18), all stable since 02/22/2018 CT. Upper abdomen: No  acute abnormality. Musculoskeletal: No aggressive appearing focal osseous lesions. Mild symmetric bilateral gynecomastia. Moderate thoracic spondylosis. IMPRESSION: 1. No thoracic adenopathy or other findings suspicious  for metastatic disease in the chest. 2. Bilateral upper lobe solid pulmonary nodules, largest 6 mm, all stable since 02/22/2018 neck CT, more likely benign. Suggest follow-up chest CT in 12 months to demonstrate continued stability. 3. Infiltrative right neck mass involving the larynx and cervical esophagus, as detailed on neck CT from 1 day prior. 4. Well-positioned tracheostomy tube. 5. Three-vessel coronary atherosclerosis. Aortic Atherosclerosis (ICD10-I70.0) and Emphysema (ICD10-J43.9). Electronically Signed   By: Ilona Sorrel M.D.   On: 09/29/2018 13:41   Dg Esophagus W Double Cm (hd)  Result Date: 09/20/2018 CLINICAL DATA:  Pain at RIGHT side of throat neck for 2 years, feels like food is getting stuck in throat EXAM: ESOPHOGRAM / BARIUM SWALLOW / BARIUM TABLET STUDY TECHNIQUE: Combined double contrast and single contrast examination performed using effervescent crystals, thick barium liquid, and thin barium liquid. The patient was observed with fluoroscopy swallowing a 13 mm barium sulphate tablet. Exam was terminated prematurely due to findings. FLUOROSCOPY TIME:  Fluoroscopy Time:  1 minutes 18 seconds Radiation Exposure Index (if provided by the fluoroscopic device): 2.4 mGy Number of Acquired Spot Images: multiple fluoroscopic screen captures COMPARISON:  None FINDINGS: Esophageal distention: Normal distention without mass or stricture Filling defects:  See below 12.5 mm barium tablet: Passed from oral cavity to stomach without obstruction Motility:  Normal for age Mucosa:  Esophageal mucosa smooth.  Abnormal hypopharynx see below Hypopharynx/cervical esophagus: Laryngeal penetration and silent gross aspiration of contrast into trachea. Contrast passed caudally into the RIGHT mainstem  bronchus and RIGHT lower lobe medially with alveolar opacification. Large polypoid filling defect within the hypopharynx primarily to the RIGHT and anteriorly, extending from above the epiglottis to the RIGHT piriform sinus. Irregular macrolobulated border with scattered crevices. Epiglottis not clearly identified. Swallowed contrast courses along the LEFT lateral margin of the pharynx and hypopharynx, with the LEFT lateral wall mucosa appearing smooth. Narrowing at the hypopharynx on AP view. Hiatal hernia:  Absent GE reflux:  Not identified during exam Other:  N/A IMPRESSION: Large polypoid macrolobulated mass within hypopharynx, predominantly on RIGHT and extending from above epiglottis to the RIGHT piriform sinus, with loss of epiglottic silhouette. Associated narrowing of the hypopharynx with deviation of swallowed liquids to the LEFT. 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 Gross laryngeal penetration and silent aspiration of contrast material into trachea, with contrast coursing inferiorly into RIGHT lower lobe bronchus and alveoli. Electronically Signed   By: Lavonia Dana M.D.   On: 09/20/2018 11:13   Ct Soft Tissue Neck W Contrast  Result Date: 09/28/2018 CLINICAL DATA:  73 year old male status post tracheostomy and laryngeal biopsy postoperative day 1. Findings suspicious for squamous cell carcinoma. EXAM: CT NECK WITH CONTRAST TECHNIQUE: Multidetector CT imaging of the neck was performed using the standard protocol following the bolus administration of intravenous contrast. CONTRAST:  62mL OMNIPAQUE IOHEXOL 300 MG/ML  SOLN COMPARISON:  Neck CT 02/22/2018. FINDINGS: Pharynx and larynx: Tracheostomy has been placed, and there is a small volume of soft tissue gas tracking in the retropharyngeal space, in the bilateral neck, and into the visible mediastinum. The tube appears well positioned. There is debris in the cricoid above the tube. Trace retained secretions or debris in the  trachea below the tube. There is an infiltrative and fungating heterogeneously enhancing mass now occupying the larynx, chiefly at and above the glottis, but also possibly infiltrating and involving the cervical esophagus below the glottis (series 3, image 80). There is extension through the right thyroid cartilage and contiguous appearing  tumor - probably ex nodal disease - in the right lower neck lateral to the carotid space (series 3, image 64). There is associated narrowing of the right IJ at that level, which remains patent. All told, the poorly marginated hyperenhancing tumor encompasses 35 x 76 x 66 millimeters (AP by transverse by CC). See series 3, image 63, 64, 74, and coronal images 51 through 54. Tumor extends to the AE folds and the mucosa hypopharynx is generally hyperenhancing. The oropharynx and nasopharynx are spared. Negative superior parapharyngeal spaces, the right lower paralaryngeal spaces are abnormal. The retropharyngeal space contains gas from the tracheostomy. Salivary glands: Negative sublingual space. The submandibular glands and parotid glands remain within normal limits. Thyroid: Postoperative changes from tracheostomy, but the posterior right thyroid lobe also appears abutted by tumor on series 3, image 84. Lymph nodes: Extensive right level 3 Aks nodal disease suspected, resulting in a semi contiguous appearance of tumor from the level 3 station into the larynx. Malignant right level 2A lymph nodes measuring 16 millimeter short axis on series 3, image 47. No level 1 node involvement. No abnormal level 4 nodes are evident. There are small but conspicuous left level 2 B (series 3, image 40) and level IIIb (image 61) lymph nodes which are larger than in 2019, up to 7 millimeters diameter. Vascular: Mass effect on the right internal jugular vein from tumor in the right lower neck, but the vein remains patent. Tumor surrounds the right lower carotid space but the right carotid remains  patent with calcified atherosclerosis. Other major vascular structures in the neck and at the skull base are patent. Limited intracranial: Negative. Visualized orbits: Negative. Mastoids and visualized paranasal sinuses: Clear. Skeleton: Hyperenhancing tumor appears to occupy the prevertebral space in the lower cervical spine from C4-C5 through C7-T1, and probably is associated with cervical esophageal involvement. See series 3, image 80 and sagittal image 52. No bone erosion identified. No lytic or suspicious osseous lesion identified elsewhere. Upper chest: Centrilobular emphysema. Stable small right apical nodules on series 9, image 88, and left apically nodule on image 94, which are likely related to lung scarring. Small volume pneumomediastinum following tracheostomy tube placement. Distal to the tube the trachea appears patent with some retained secretions. No superior mediastinal lymphadenopathy. Calcified aortic atherosclerosis. IMPRESSION: 1. Infiltrative and poorly marginated enhancing tumor in the lower neck eccentric to the right appears to involve the larynx, the adjacent cervical esophagus, and is somewhat contiguous with ex-nodal disease in the right level 3 station, surrounding the right carotid space and narrowing the right IJ which remains patent. - all told tumor encompasses 35 x 76 x 66 mm (AP by transverse by CC) - prevertebral space involvement suspected C4-C5 through C7-T1 but no underlying bony changes. - malignant right level 2 nodal disease, and small but suspicious contralateral left level 2 and left level 3 nodes. 2. Status post tracheostomy with small volume pneumomediastinum and soft tissue gas in the neck. The airway appears patent below the tracheostomy tube and no adverse features are identified. Electronically Signed   By: Genevie Ann M.D.   On: 09/28/2018 17:34   Ct Chest W Contrast  Result Date: 09/29/2018 CLINICAL DATA:  Neck mass, advanced neck cancer, status post tracheostomy.  EXAM: CT CHEST WITH CONTRAST TECHNIQUE: Multidetector CT imaging of the chest was performed during intravenous contrast administration. CONTRAST:  55mL OMNIPAQUE IOHEXOL 300 MG/ML  SOLN COMPARISON:  09/28/2018 neck CT. FINDINGS: Cardiovascular: Normal heart size. No significant pericardial effusion/thickening. Three-vessel coronary atherosclerosis.  Atherosclerotic nonaneurysmal thoracic aorta. Normal caliber pulmonary arteries. No central pulmonary emboli. Mediastinum/Nodes: No discrete thyroid nodules. Normal thoracic esophagus. Infiltrative right neck mass involving larynx and cervical esophagus, as detailed on neck CT from 1 day prior. Subcutaneous emphysema scattered in the lower neck bilaterally. Scattered pneumomediastinum throughout the bilateral paratracheal and paraesophageal regions. No pathologically enlarged axillary, mediastinal or hilar lymph nodes. Lungs/Pleura: No pneumothorax. No pleural effusion. Moderate centrilobular emphysema with diffuse bronchial wall thickening. Tracheostomy tube tip is in the tracheal lumen just below thoracic inlet. No acute consolidative airspace disease or lung masses. Scattered calcified granulomas throughout the medial right lower lobe. A few scattered solid pulmonary nodules in the upper lobes bilaterally, predominantly at the lung apices, largest 6 mm at the apical left upper lobe (series 5/image 17) and 5 mm in the apical right upper lobe (series 5/image 18), all stable since 02/22/2018 CT. Upper abdomen: No acute abnormality. Musculoskeletal: No aggressive appearing focal osseous lesions. Mild symmetric bilateral gynecomastia. Moderate thoracic spondylosis. IMPRESSION: 1. No thoracic adenopathy or other findings suspicious for metastatic disease in the chest. 2. Bilateral upper lobe solid pulmonary nodules, largest 6 mm, all stable since 02/22/2018 neck CT, more likely benign. Suggest follow-up chest CT in 12 months to demonstrate continued stability. 3.  Infiltrative right neck mass involving the larynx and cervical esophagus, as detailed on neck CT from 1 day prior. 4. Well-positioned tracheostomy tube. 5. Three-vessel coronary atherosclerosis. Aortic Atherosclerosis (ICD10-I70.0) and Emphysema (ICD10-J43.9). Electronically Signed   By: Ilona Sorrel M.D.   On: 09/29/2018 13:41   Dg Esophagus W Double Cm (hd)  Result Date: 09/20/2018 CLINICAL DATA:  Pain at RIGHT side of throat neck for 2 years, feels like food is getting stuck in throat EXAM: ESOPHOGRAM / BARIUM SWALLOW / BARIUM TABLET STUDY TECHNIQUE: Combined double contrast and single contrast examination performed using effervescent crystals, thick barium liquid, and thin barium liquid. The patient was observed with fluoroscopy swallowing a 13 mm barium sulphate tablet. Exam was terminated prematurely due to findings. FLUOROSCOPY TIME:  Fluoroscopy Time:  1 minutes 18 seconds Radiation Exposure Index (if provided by the fluoroscopic device): 2.4 mGy Number of Acquired Spot Images: multiple fluoroscopic screen captures COMPARISON:  None FINDINGS: Esophageal distention: Normal distention without mass or stricture Filling defects:  See below 12.5 mm barium tablet: Passed from oral cavity to stomach without obstruction Motility:  Normal for age Mucosa:  Esophageal mucosa smooth.  Abnormal hypopharynx see below Hypopharynx/cervical esophagus: Laryngeal penetration and silent gross aspiration of contrast into trachea. Contrast passed caudally into the RIGHT mainstem bronchus and RIGHT lower lobe medially with alveolar opacification. Large polypoid filling defect within the hypopharynx primarily to the RIGHT and anteriorly, extending from above the epiglottis to the RIGHT piriform sinus. Irregular macrolobulated border with scattered crevices. Epiglottis not clearly identified. Swallowed contrast courses along the LEFT lateral margin of the pharynx and hypopharynx, with the LEFT lateral wall mucosa appearing  smooth. Narrowing at the hypopharynx on AP view. Hiatal hernia:  Absent GE reflux:  Not identified during exam Other:  N/A IMPRESSION: Large polypoid macrolobulated mass within hypopharynx, predominantly on RIGHT and extending from above epiglottis to the RIGHT piriform sinus, with loss of epiglottic silhouette. Associated narrowing of the hypopharynx with deviation of swallowed liquids to the LEFT. 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 Gross laryngeal penetration and silent aspiration of contrast material into trachea, with contrast coursing inferiorly into RIGHT lower lobe bronchus and alveoli. Electronically Signed   By: Lavonia Dana  M.D.   On: 09/20/2018 11:13    Assessment and Plan:   Laryngeal mass -Status post tracheostomy placement and laryngeal biopsy on 09/27/2018. -Pathology is pending.  Suspicious for squamous cell carcinoma. -CT of the neck shows extensive laryngeal/pharyngeal/esophageal mass.  CT of the chest shows no thoracic adenopathy or other findings suspicious for metastatic disease. -Await final pathology results.  Patient will be seen by Dr. Maylon Peppers with further discussion regarding treatment options.  Thank you for this referral.   Mikey Bussing, DNP, AGPCNP-BC, AOCNP

## 2018-10-01 ENCOUNTER — Inpatient Hospital Stay (HOSPITAL_COMMUNITY): Payer: Medicare Other

## 2018-10-01 ENCOUNTER — Other Ambulatory Visit: Payer: Self-pay | Admitting: Hematology

## 2018-10-01 DIAGNOSIS — C320 Malignant neoplasm of glottis: Secondary | ICD-10-CM

## 2018-10-01 DIAGNOSIS — R1312 Dysphagia, oropharyngeal phase: Secondary | ICD-10-CM

## 2018-10-01 NOTE — Progress Notes (Signed)
Speech Language Pathology Treatment: Logan Price Speaking valve  Patient Details Name: Logan Price MRN: [id number] DOB: 24-Oct-1945 Today's Date: 10/01/2018 Time: 1201-1215 SLP Time Calculation (min) (ACUTE ONLY): 14 min  Assessment / Plan / Recommendation Clinical Impression  Pt was seen for additional PMV treatment with focus on donning/doffing valve. Upon SLP arrival, pt was using his yankauer at his trach hub with Mod I to suction secretions that he was clearing tracheally. SLP provided demonstration with pt returning demonstration to don/doff valve 1x each given Min cues. Valve was in place transiently, only for a few seconds, but long enough for him to produce phonation that was wet in quality. He coughed to clear additional secretions. At that point transport services arrived to take him and pt requested to use the urinal at the EOB prior to leaving his room. With the PMV off, pt began to move to the EOB, at which point his HR increased to 170. RN arrived to assess. She provided tracheal suction with removal of a large amount of secretions, although HR remained elevated. Pt was left with nursing to assist. Will continue to follow for additional education and training.   HPI HPI: Pt is a 73 yo male admitted for planned trach and laryngeal biopsy 7/2. 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 Esophagram 6/26 showed silent gross aspiration of barium. PMH includes: HLD, HTN, hepatitis C, GERD, diverticulitis      SLP Plan  Continue with current plan of care       Recommendations  Diet recommendations: NPO Medication Administration: Via alternative means      Patient may use Passy-Muir Speech Valve: Intermittently with supervision PMSV Supervision: Full         Oral Care Recommendations: Oral care QID Follow up Recommendations: Outpatient SLP;Home health SLP SLP Visit Diagnosis: Dysphagia, unspecified  (R13.10) Plan: Continue with current plan of care       GO                Logan Price 10/01/2018, 12:42 PM  Logan Price, M.A. CCC-SLP Acute Herbalist [phone] Office [phone]

## 2018-10-01 NOTE — Progress Notes (Signed)
1235: Pt sitting on side of bed trying to urinate. HR in 150-170s sustaining. EKG obtained, see chart. BP 106/93 (99). Bladder scan showed 480cc of urine. In and out cath performed, resulting in 600cc urine. Dr. Benjamine Mola paged. Awaiting orders.  1247: Pt spontaneously converted back to NSR. BP 127/71 (88).   Clyde Canterbury, RN

## 2018-10-01 NOTE — Progress Notes (Signed)
Logan Price   DOB:07/27/1945   ZD#:[id number]    HEME/ONC PROBLEMS: 1. 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 -I reviewed the imaging results in detail with the patient -As the biopsy just confirmed squamous cell carcinoma, patient will require port to facilitate outpatient chemotherapy and feeding tube to maintain nutrition; I would recommend both procedures to be done prior to discharge in order to start outpatient treatment promptly -In addition, patient will require PET scan to assess for any occult metastatic disease, which can only be done in the outpatient setting; I have placed order for PET and requested the H&N navigator to assist with its scheduling ASAP -Finally, I have requested the patient to be seen by radiation oncology ASAP to coordinate the timing for definitive chemoradiation, assuming that PET scan does not show any distant metastatic disease  Dysphagia -Secondary to bulky glottic tumor -Speech evaluation on 09/30/2018 showed high risk for aspiration and recommended patient to be NPO -As patient has lost over 20 pounds during the past year due to dysphasia, I would recommend placing feeding tube to support nutrition   Increased airway secretions -Given the patient requires frequent suction to maintain patent airway, I would recommend patient to be provided suction equipment prior to discharge so that he does not develop airway obstruction due to secretions   Goals of care discussion -I informed patient that in the absence of definite metastatic disease, the goal of treatment is still for curative intent -However, given the very locally advanced disease, patient was informed that he would still be at  high risk for recurrent and/or metastatic disease, and treatment response is not guaranteed even with aggressive chemoradiation -Patient expressed understanding, and was willing to proceed with treatment as needed  Thank you for the opportunity to participate in Logan Price's care.  Please do not hesitate to contact me if there are any questions.  Logan Holms, MD 10/01/2018  12:34 PM  Subjective:  Logan Price reports that he is still having frequent coughing with mostly clear secretions through the tracheostomy tube, and he has had to suction frequently to keep the airway clear.  He denies any pain in the throat.   ROS: Constitutional: ( - ) fevers, ( - )  chills , ( - ) night sweats Ears, nose, mouth, throat, and face: ( - ) mucositis, ( - ) sore throat Respiratory: ( + ) cough, ( - ) dyspnea, ( - ) wheezes Cardiovascular: ( - ) palpitation, ( - ) chest discomfort, ( - ) lower extremity swelling Gastrointestinal:  ( - ) nausea, ( - ) heartburn, ( - ) change in bowel habits Skin: ( - ) abnormal skin rashes Behavioral/Psych: ( - ) mood change, ( - ) new changes  All other systems were reviewed with the patient and are negative.  Objective:  Vitals:   10/01/18 0749 10/01/18 1109  BP: 138/66   Pulse: 91 89  Resp: 20 (!) 22  Temp: 98 F (36.7 C)   SpO2: 93% 96%     Intake/Output Summary (Last 24 hours) at 10/01/2018 1234 Last data filed at 10/01/2018 0400 Gross per 24 hour  Intake 4018.11 ml  Output 950 ml  Net 3068.11 ml    GENERAL: alert, no distress, thin  SKIN:  skin color, texture, turgor are normal, no rashes or significant lesions EYES: conjunctiva are pink and non-injected, sclera clear OROPHARYNX: no exudate, no erythema; lips, buccal mucosa, and tongue normal  NECK: tracheostomy in place with increased secretions via the trach  LUNGS: clear to auscultation with normal breathing effort HEART: regular rate & rhythm and no murmurs and no lower extremity edema ABDOMEN: soft,  non-tender, non-distended, normal bowel sounds PSYCH: alert, oriented  NEURO: no focal motor/sensory deficits   Labs:  Lab Results  Component Value Date   WBC 8.7 09/27/2018   HGB 14.2 09/27/2018   HCT 43.6 09/27/2018   MCV 89.5 09/27/2018   PLT 329 09/27/2018   NEUTROABS 3.8 04/25/2018    Lab Results  Component Value Date   NA 139 09/27/2018   K 3.9 09/27/2018   CL 100 09/27/2018   CO2 30 09/27/2018    Studies:  Ct Chest W Contrast  Result Date: 09/29/2018 CLINICAL DATA:  Neck mass, advanced neck cancer, status post tracheostomy. EXAM: CT CHEST WITH CONTRAST TECHNIQUE: Multidetector CT imaging of the chest was performed during intravenous contrast administration. CONTRAST:  75mL OMNIPAQUE IOHEXOL 300 MG/ML  SOLN COMPARISON:  09/28/2018 neck CT. FINDINGS: Cardiovascular: Normal heart size. No significant pericardial effusion/thickening. Three-vessel coronary atherosclerosis. Atherosclerotic nonaneurysmal thoracic aorta. Normal caliber pulmonary arteries. No central pulmonary emboli. Mediastinum/Nodes: No discrete thyroid nodules. Normal thoracic esophagus. Infiltrative right neck mass involving larynx and cervical esophagus, as detailed on neck CT from 1 day prior. Subcutaneous emphysema scattered in the lower neck bilaterally. Scattered pneumomediastinum throughout the bilateral paratracheal and paraesophageal regions. No pathologically enlarged axillary, mediastinal or hilar lymph nodes. Lungs/Pleura: No pneumothorax. No pleural effusion. Moderate centrilobular emphysema with diffuse bronchial wall thickening. Tracheostomy tube tip is in the tracheal lumen just below thoracic inlet. No acute consolidative airspace disease or lung masses. Scattered calcified granulomas throughout the medial right lower lobe. A few scattered solid pulmonary nodules in the upper lobes bilaterally, predominantly at the lung apices, largest 6 mm at the apical left upper lobe (series 5/image 17) and 5 mm in the  apical right upper lobe (series 5/image 18), all stable since 02/22/2018 CT. Upper abdomen: No acute abnormality. Musculoskeletal: No aggressive appearing focal osseous lesions. Mild symmetric bilateral gynecomastia. Moderate thoracic spondylosis. IMPRESSION: 1. No thoracic adenopathy or other findings suspicious for metastatic disease in the chest. 2. Bilateral upper lobe solid pulmonary nodules, largest 6 mm, all stable since 02/22/2018 neck CT, more likely benign. Suggest follow-up chest CT in 12 months to demonstrate continued stability. 3. Infiltrative right neck mass involving the larynx and cervical esophagus, as detailed on neck CT from 1 day prior. 4. Well-positioned tracheostomy tube. 5. Three-vessel coronary atherosclerosis. Aortic Atherosclerosis (ICD10-I70.0) and Emphysema (ICD10-J43.9). Electronically Signed   By: Delbert Phenix M.D.   On: 09/29/2018 13:41

## 2018-10-01 NOTE — Progress Notes (Signed)
Subjective: No issues overnight. Able to talk with tube occluded.  Objective: Vital signs in last 24 hours: Temp:  [97.8 F (36.6 C)-98.4 F (36.9 C)] 98 F (36.7 C) (07/07 0749) Pulse Rate:  [71-120] 89 (07/07 1109) Resp:  [16-23] 22 (07/07 1109) BP: (131-153)/(66-92) 138/66 (07/07 0749) SpO2:  [91 %-97 %] 96 % (07/07 1109) FiO2 (%):  [28 %] 28 % (07/07 1109)  General appearance:alert, cooperative and no distress Head:Normocephalic, without obvious abnormality, atraumatic Eyes:conjunctivae/corneas clear. PERRL, EOM's intact.  Ears:normal TM's and external ear canals both ears Nose:Nares normal. Septum midline. Mucosa normal. No drainage or sinus tenderness. Throat:lips, mucosa, and tongue normal; teeth and gums normal Neck:Trach midline. No bleeding. Neurologic:Grossly normal  No results for input(s): WBC, HGB, HCT, PLT in the last 72 hours. No results for input(s): NA, K, CL, CO2, GLUCOSE, BUN, CREATININE, CALCIUM in the last 72 hours.  Medications:  I have reviewed the patient's current medications. Scheduled: . chlorhexidine  15 mL Mouth Rinse BID  . mouth rinse  15 mL Mouth Rinse q12n4p   Continuous: . dextrose 5 % and 0.45 % NaCl with KCl 20 mEq/L 100 mL/hr at 10/01/18 0744    Assessment/Plan: POD #4s/p trach and laryngeal biopsy.  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 -Neck CT shows extensive laryngeal/pharyngeal/esophageal SCCA. -Trach changed to an uncuffed#6 Shiley. Lurline Idol care teaching. - Oncology consulted. Chest CT showed no metastatic disease. - Plan G-tube placement. Will consult IR.   LOS: 4 days   Logan Price W Logan Price 10/01/2018, 11:30 AM

## 2018-10-01 NOTE — Progress Notes (Signed)
Brief Oncology note:  Pathology report available. Results discussed with patient. Biopsy shows invasive and in situ squamous cell carcinoma, p16 positive.  Discussed need to proceed with PEG tube placement and PAC placement by IR. Orders have been placed. H&N navigator will also help facilitate PET scan as an outpatient and appointment with Radiation Oncology.   The patient inquired about staging of his disease. We discussed that he has locally advanced disease without evidence of metastatic disease and that he is a Stage IVB assuming no evidence of metastatic disease on the future PET scan. We discussed that the goal of therapy is curative. All questions were answered.   Mikey Bussing, DNP, AGPCNP-BC, AOCNP

## 2018-10-01 NOTE — Progress Notes (Signed)
Respiratory Note:  [place] Education Patient's wife, [name], present for trach care teaching. Ms Peggye Fothergill was shown how to suction the trach using sterile technique, clean the trach inner cannula, how to change to gauze dressing and how to change trach ties. She was able to demonstrate donning gloves and using sterile technique for suctioning. Ms Peggye Fothergill says she feels "OK" with suctioning. She demonstrated how to remove the trach inner cannula, clean it and replace the inner cannula without difficulty.  I did stress to both the patient and Ms Peggye Fothergill that when changing the trach ties, someone must be hold the trach securely in place when ties were removed and being replaced by a 2nd person, otherwise the trach could become dislodged. Both the patient and Ms. Foddrell state that they fully understand that changing trach ties is a 2 person task.  Ms Peggye Fothergill stated that she had difficulty seeing the hole in the trach flange to put the trach ties through to secure the trach. Ms Peggye Fothergill was able to practice each task but she states she still feels apprehensive about caring for Mr Kazlauskas's trach at home. Mr. Hurlbut and Ms Peggye Fothergill have been instructed not to try to reinsert the trach if it should become dislodged but  to call 911 to get help. I do feel that Ms Peggye Fothergill needs  more practice performing the trach care.  She has asked if she can schedule more trach care education and practice so that she feels more confident caring for her husband.

## 2018-10-01 NOTE — Progress Notes (Signed)
Respiratory Note: Talked with Logan Price, and have schedule more [facility] Education to be conducted on 7-8-202 at 11:30A

## 2018-10-01 NOTE — Evaluation (Signed)
Physical Therapy Evaluation Patient Details Name: Logan Price MRN: [id number] DOB: 1945/08/05 Today's Date: 10/01/2018   History of Present Illness  Pt is a 73 yo male admitted for planned trach and laryngeal biopsy 7/2. 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 Esophagram 6/26 showed silent gross aspiration of barium. PMH includes: HLD, HTN, hepatitis C, GERD, diverticulitis  Clinical Impression  Pt admitted with/for planned interventions above.  Pt is presently weak and deconditioned, but can mobilize and at min guard to min assist.  Pt currently limited functionally due to the problems listed below.  (see problems list.)  Pt will benefit from PT to maximize function and safety to be able to get home safely with available assist .      Follow Up Recommendations Home health PT(may progress to no services given time)    Equipment Recommendations  Other (comment)(TBA)    Recommendations for Other Services       Precautions / Restrictions Precautions Precautions: Fall      Mobility  Bed Mobility Overal bed mobility: Modified Independent                Transfers Overall transfer level: Needs assistance   Transfers: Sit to/from Stand Sit to Stand: Min guard;Supervision(min guar from lower surface)            Ambulation/Gait Ambulation/Gait assistance: Min assist Gait Distance (Feet): 200 Feet Assistive device: Rolling walker (2 wheeled) Gait Pattern/deviations: Step-through pattern     General Gait Details: flexed kneed gait with mild staggering, drifting.  Turns outside the RW.  Cues overall for better use of the RW  Stairs            Wheelchair Mobility    Modified Rankin (Stroke Patients Only)       Balance Overall balance assessment: Needs assistance   Sitting balance-Leahy Scale: Fair     Standing balance support: Single extremity supported;Bilateral upper extremity  supported Standing balance-Leahy Scale: Fair Standing balance comment: guarded while standing due to weakness.                             Pertinent Vitals/Pain Pain Assessment: No/denies pain    Home Living Family/patient expects to be discharged to:: Private residence Living Arrangements: Spouse/significant other Available Help at Discharge: Family;Available 24 hours/day;Available PRN/intermittently(wife and brother) Type of Home: House Home Access: Stairs to enter Entrance Stairs-Rails: Right Entrance Stairs-Number of Steps: 6 Home Layout: One level Home Equipment: None Additional Comments: TBA    Prior Function Level of Independence: Independent               Hand Dominance        Extremity/Trunk Assessment   Upper Extremity Assessment Upper Extremity Assessment: Overall WFL for tasks assessed    Lower Extremity Assessment Lower Extremity Assessment: Overall WFL for tasks assessed;Generalized weakness       Communication   Communication: Tracheostomy  Cognition Arousal/Alertness: Awake/alert Behavior During Therapy: WFL for tasks assessed/performed Overall Cognitive Status: Within Functional Limits for tasks assessed                                        General Comments General comments (skin integrity, edema, etc.): sats on RA started to slowly drop into the upper 80's, on 28% TC rose to 96% at rest  and stayed at 90/91% during gait.    Exercises     Assessment/Plan    PT Assessment Patient needs continued PT services  PT Problem List Decreased strength;Decreased activity tolerance;Decreased mobility;Cardiopulmonary status limiting activity;Decreased knowledge of use of DME;Decreased balance       PT Treatment Interventions Gait training;Stair training;Functional mobility training;Therapeutic activities;Balance training;Patient/family education    PT Goals (Current goals can be found in the Care Plan section)  Acute  Rehab PT Goals Patient Stated Goal: back independent PT Goal Formulation: With patient Time For Goal Achievement: 10/15/18 Potential to Achieve Goals: Good    Frequency Min 3X/week   Barriers to discharge        Co-evaluation               AM-PAC PT "6 Clicks" Mobility  Outcome Measure Help needed turning from your back to your side while in a flat bed without using bedrails?: None Help needed moving from lying on your back to sitting on the side of a flat bed without using bedrails?: None Help needed moving to and from a bed to a chair (including a wheelchair)?: A Little Help needed standing up from a chair using your arms (e.g., wheelchair or bedside chair)?: A Little Help needed to walk in hospital room?: A Little Help needed climbing 3-5 steps with a railing? : A Little 6 Click Score: 20    End of Session   Activity Tolerance: Patient tolerated treatment well(fatigue a factor.) Patient left: Other (comment);with call bell/phone within reach(on the toilet) Nurse Communication: Mobility status PT Visit Diagnosis: Unsteadiness on feet (R26.81);Difficulty in walking, not elsewhere classified (R26.2)    Time: 5784-6962 PT Time Calculation (min) (ACUTE ONLY): 26 min   Charges:   PT Evaluation $PT Eval Moderate Complexity: 1 Mod PT Treatments $Gait Training: 8-22 mins        10/01/2018  [place] [facility], PT Acute Rehabilitation Services [phone]  (pager) [phone]  (office)  Eliseo Gum Pericles Carmicheal 10/01/2018, 5:04 PM

## 2018-10-01 NOTE — Progress Notes (Signed)
Patient ID: Logan Price, male   DOB: 02-Nov-1945, 73 y.o.   MRN: [id number]   IR aware of percutaneous Gastric tube placement request and Port a cath request.  CT Abd w/o cx ordered to evaluate anatomy for candidacy for G tube. Will review CT and plan for G tube and PAC same day. Tentatively likely 7/9

## 2018-10-01 NOTE — Progress Notes (Signed)
During the night, the pt's urinary output would become more frequent with less amount until he was unable to void.  Tried sitting in different positions and listening to running water unsuccessfully.  A bladder scan showed about 502 cc of urine in the bladder.  RN attempted to inform the on call physician but was unable to reach them.  An hour after the bladder scan, and in & out catheter was inserted and about 700 cc was removed. Second bladder scan showed no urine in the bladder. Pt resting in bed.  Will continue to monitor.  Lupita Dawn, RN

## 2018-10-02 ENCOUNTER — Inpatient Hospital Stay (HOSPITAL_COMMUNITY): Payer: Medicare Other

## 2018-10-02 DIAGNOSIS — R1312 Dysphagia, oropharyngeal phase: Secondary | ICD-10-CM

## 2018-10-02 DIAGNOSIS — C320 Malignant neoplasm of glottis: Secondary | ICD-10-CM

## 2018-10-02 LAB — SARS CORONAVIRUS 2 BY RT PCR (HOSPITAL ORDER, PERFORMED IN ~~LOC~~ HOSPITAL LAB): SARS Coronavirus 2: NEGATIVE

## 2018-10-02 MED ORDER — TAMSULOSIN HCL 0.4 MG PO CAPS: 0.4000 mg | ORAL_CAPSULE | Freq: Once | ORAL | Status: DC

## 2018-10-02 MED ORDER — LIDOCAINE HCL URETHRAL/MUCOSAL 2 % EX GEL
1.0000 "application " | Freq: Once | CUTANEOUS | Status: DC
  Filled 2018-10-02: qty 20
  Filled 2018-10-02: qty 10

## 2018-10-02 MED ORDER — CEFAZOLIN SODIUM-DEXTROSE 2-4 GM/100ML-% IV SOLN
2.0000 g | INTRAVENOUS | Status: AC
  Administered 2018-10-03: 2 g via INTRAVENOUS
  Filled 2018-10-02: qty 100

## 2018-10-02 NOTE — Progress Notes (Signed)
Initial Nutrition Assessment  DOCUMENTATION CODES:   Not applicable(Suspect malnutrition)  INTERVENTION:   Monitor magnesium, potassium, and phosphorus daily for at least 3 days, MD to replete as needed, as pt is at risk for refeeding syndrome.   Once PEG placed:  -Osmolite 1.5 @ 20 ml/hr -Increase by 10 ml Q4 hours to goal rate of 65 ml/hr (1560 ml) -30 ml Prostat BID  At goal TF provides: 2540 kcals, 128 grams protein, 1189 ml free water. Meets 100% of needs.   NUTRITION DIAGNOSIS:   Inadequate oral intake related to inability to eat as evidenced by NPO status.  GOAL:   Patient will meet greater than or equal to 90% of their needs  MONITOR:   Skin, TF tolerance, Weight trends, Labs, I & O's  REASON FOR ASSESSMENT:   NPO/Clear Liquid Diet    ASSESSMENT:   Patient with PMH significant for diverticulitis, gastritis, HLD, HTN, laryngeal cancer, and esophogeal dysphagia. 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   7/3- tracheostomy, laryngoscopy and biopsy   RD working remotely.  Underwent PMV trial today. Plan for PEG and Port-A-Cath placement tomorrow.   Unable to speak with pt via phone. H&P suggests pt has been eating less due to difficulty swallowing resulting in weight loss. Will attempt to get nutrition history if possible.   Per chart records, pt weighed 155 lb on 1/30 and 133 lb this admission (14.2% wt loss in 7 months, significant for time frame). Suspect pt is malnourished. Will attempt NFPE at follow if pt remains admitted.   I/O: +2,930 ml since admit UOP: 1.275 ml x 24 hrs   Drips: 1/2NS with D5 and 20 mEq KCl @ 100 ml/hr  Medications: reviewed  Labs: reviewed   Diet Order:   Diet Order            Diet NPO time specified Except for: Sips with Meds  Diet effective midnight        Diet NPO time specified  Diet effective now              EDUCATION NEEDS:   Not  appropriate for education at this time  Skin:  Skin Assessment: Skin Integrity Issues: Skin Integrity Issues:: Incisions Incisions: neck  Last BM:  7/7  Height:   Ht Readings from Last 1 Encounters:  09/27/18 5\' 9"  (1.753 m)    Weight:   Wt Readings from Last 1 Encounters:  09/28/18 60.5 kg    Ideal Body Weight:  72.7 kg  BMI:  Body mass index is 19.7 kg/m.  Estimated Nutritional Needs:   Kcal:  1610-9604 kcal  Protein:  115-130 grams  Fluid:  >/= 2.3 L/day  Mariana Single RD, LDN Clinical Nutrition Pager # - [phone]

## 2018-10-02 NOTE — Evaluation (Signed)
Occupational Therapy Evaluation Patient Details Name: Logan Price MRN: [id number] DOB: 1946-01-08 Today's Date: 10/02/2018    History of Present Illness Pt is a 73 yo male admitted for planned trach and laryngeal biopsy 7/2. 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 Esophagram 6/26 showed silent gross aspiration of barium. PMH includes: HLD, HTN, hepatitis C, GERD, diverticulitis   Clinical Impression   Pt was independent prior to admission. Presents with generalized weakness requiring up to min guard assist for ADL and mobility without a device. Pt with significant trach secretions throughout session. Likely to progress well and not require post acute OT. He has a supportive wife at home 24 hours. Will follow.    Follow Up Recommendations  No OT follow up    Equipment Recommendations  None recommended by OT    Recommendations for Other Services       Precautions / Restrictions Precautions Precautions: Fall Precaution Comments: increased trach secretions Restrictions Weight Bearing Restrictions: No      Mobility Bed Mobility Overal bed mobility: Modified Independent             General bed mobility comments: HOB up moderately  Transfers Overall transfer level: Needs assistance Equipment used: None Transfers: Sit to/from Stand Sit to Stand: Min guard              Balance Overall balance assessment: Needs assistance   Sitting balance-Leahy Scale: Good Sitting balance - Comments: no LOB with donning socks     Standing balance-Leahy Scale: Fair Standing balance comment: min guard at sink for static standing                           ADL either performed or assessed with clinical judgement   ADL Overall ADL's : Needs assistance/impaired Eating/Feeding: NPO   Grooming: Standing;Wash/dry hands;Min guard   Upper Body Bathing: Set up;Sitting   Lower Body Bathing: Sit to/from  stand;Min guard   Upper Body Dressing : Sitting;Set up   Lower Body Dressing: Sit to/from stand;Min guard Lower Body Dressing Details (indicate cue type and reason): able to cross foot over opposite knee to don socks Toilet Transfer: Ambulation;Minimal assistance(hand held)   Toileting- Architect and Hygiene: Sit to/from stand;Min guard       Functional mobility during ADLs: Minimal assistance(hand held) General ADL Comments: assist due to weakness     Vision Baseline Vision/History: Wears glasses Wears Glasses: At all times Patient Visual Report: No change from baseline       Perception     Praxis      Pertinent Vitals/Pain Pain Assessment: No/denies pain     Hand Dominance Left   Extremity/Trunk Assessment Upper Extremity Assessment Upper Extremity Assessment: Overall WFL for tasks assessed   Lower Extremity Assessment Lower Extremity Assessment: Defer to PT evaluation       Communication Communication Communication: Tracheostomy   Cognition Arousal/Alertness: Awake/alert Behavior During Therapy: WFL for tasks assessed/performed Overall Cognitive Status: Within Functional Limits for tasks assessed                                     General Comments       Exercises     Shoulder Instructions      Home Living Family/patient expects to be discharged to:: Private residence Living Arrangements: Spouse/significant other Available Help at Discharge:  Family;Available 24 hours/day;Available PRN/intermittently(wife and brother) Type of Home: House Home Access: Stairs to enter [employer] of Steps: 6 Entrance Stairs-Rails: Right Home Layout: One level     Bathroom Shower/Tub: Producer, television/film/video: Handicapped height     Home Equipment: None          Prior Functioning/Environment Level of Independence: Independent                 OT Problem List: Decreased strength;Decreased activity  tolerance;Impaired balance (sitting and/or standing)      OT Treatment/Interventions: Self-care/ADL training;DME and/or AE instruction;Patient/family education    OT Goals(Current goals can be found in the care plan section) Acute Rehab OT Goals Patient Stated Goal: back independence OT Goal Formulation: With patient Time For Goal Achievement: 10/16/18 Potential to Achieve Goals: Good ADL Goals Pt Will Perform Grooming: Independently;standing Pt Will Perform Lower Body Bathing: Independently Pt Will Perform Lower Body Dressing: Independently;sit to/from stand Pt Will Transfer to Toilet: Independently;ambulating Pt Will Perform Toileting - Clothing Manipulation and hygiene: Independently;sit to/from stand  OT Frequency: Min 2X/week   Barriers to D/C:            Co-evaluation              AM-PAC OT "6 Clicks" Daily Activity     Outcome Measure Help from another person eating meals?: Total Help from another person taking care of personal grooming?: A Little Help from another person toileting, which includes using toliet, bedpan, or urinal?: A Little Help from another person bathing (including washing, rinsing, drying)?: A Little Help from another person to put on and taking off regular upper body clothing?: None Help from another person to put on and taking off regular lower body clothing?: A Little 6 Click Score: 17   End of Session Equipment Utilized During Treatment: Gait belt  Activity Tolerance: Patient tolerated treatment well Patient left: in bed;with call bell/phone within reach  OT Visit Diagnosis: Unsteadiness on feet (R26.81);Muscle weakness (generalized) (M62.81)                Time: 3474-2595 OT Time Calculation (min): 18 min Charges:  OT General Charges $OT Visit: 1 Visit OT Evaluation $OT Eval Moderate Complexity: 1 Mod  Martie Round, OTR/L Acute Rehabilitation Services Pager: [phone] Office: [phone]  Evern Bio 10/02/2018,  10:32 AM

## 2018-10-02 NOTE — Progress Notes (Signed)
SLP Cancellation Note  Patient Details Name: Logan Price MRN: [id number] DOB: 1945-11-13   Cancelled treatment:       Reason Eval/Treat Not Completed: Patient unavailable - currently receiving training from RT with family present. Will f/u as able.   [name] 10/02/2018, 12:18 PM  [name], M.A. [place] Acute Environmental education officer [phone] Office [phone]

## 2018-10-02 NOTE — Progress Notes (Signed)
Subjective: Pt has difficulty with urination last night. Multiple failed I&O cath. A Foley is now in place.   Objective: Vital signs in last 24 hours: Temp:  [97.6 F (36.4 C)-98 F (36.7 C)] 98 F (36.7 C) (07/08 0342) Pulse Rate:  [87-160] 106 (07/08 0808) Resp:  [13-28] 17 (07/08 0808) BP: (106-174)/(75-93) 132/75 (07/08 0426) SpO2:  [93 %-98 %] 95 % (07/08 0808) FiO2 (%):  [28 %] 28 % (07/08 0808)  General appearance:alert, cooperative and no distress Head:Normocephalic, without obvious abnormality, atraumatic Eyes:conjunctivae/corneas clear. PERRL, EOM's intact.  Ears:normal TM's and external ear canals both ears Nose:Nares normal. Septum midline. Mucosa normal. No drainage or sinus tenderness. Throat:lips, mucosa, and tongue normal; teeth and gums normal Neck:Trach midline. No bleeding. Neurologic:Grossly normal  No results for input(s): WBC, HGB, HCT, PLT in the last 72 hours. No results for input(s): NA, K, CL, CO2, GLUCOSE, BUN, CREATININE, CALCIUM in the last 72 hours.  Medications:  I have reviewed the patient's current medications. Scheduled: . chlorhexidine  15 mL Mouth Rinse BID  . lidocaine  1 application Urethral Once  . mouth rinse  15 mL Mouth Rinse q12n4p  . tamsulosin  0.4 mg Oral Once   Continuous: . dextrose 5 % and 0.45 % NaCl with KCl 20 mEq/L 100 mL/hr at 10/02/18 0345    Assessment/Plan: POD #5s/p trach and laryngeal biopsy.  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 -Neck CT shows extensive laryngeal/pharyngeal/esophagealSCCA. -Trach changed to anuncuffed#[id number]. Lurline Idol care teaching. - Oncology consulted. - Plan G-tube and port placement per IR.  - Will start flomax once the G-tube is in place.   LOS: 5 days   Logan Price 10/02/2018, 8:49 AM

## 2018-10-02 NOTE — Progress Notes (Signed)
Respiratory Notes: Trach Care Education:  Patient's wife Ms Peggye Fothergill, and brother, Romin Divita, present for trach care education. Mr Kindred Reidinger states he took  care of his sister who had a tracheostomy and he felt confident caring for his brother. Mrs Vladimir Faster demonstrated sterile technique when suctioning the patient's trach.  She also demonstrated removing the trach inner cannula and cleaning it then replacing the inner cannula.  She cleaned the trach site and placed a clean gauze dressing under the trach flanges.  Mr Darnell Level. Crill states he is comfortable with these procedures.  Lurline Idol ties were adjusted and both Ms Peggye Fothergill and Mr Darnell Level. Balis understand that changing trach ties requires both parties to be present so that while one party removes the ties, the other is securely holding the trach in place to ensure that it does not become dislodged. Both Mr' G. Aida Puffer and Ms Peggye Fothergill understand that if the trach does become dislodged, they are not to attempt to reinsert the trach into the stoma but  to call 911 for help. Mr Darnell Level. Probert feels confident performing trach care for his brother.  Ms Peggye Fothergill says she would like to practice the trach care procedures more and plans to arrange time to practice while her husband is still in the hospital.

## 2018-10-02 NOTE — Progress Notes (Signed)
Pt reporting the urge to void but unable to successfully urinate. Tried changing positions, ambulating, running water, all unsuccessful.  Bladder scan showing > 602 mL. Order to in and out PRN. Attempted twice, once by another RN. Unsuccessful on both attempts. Attending physician paged. Verbal orders given to give 0.4 mg of flomax. Will continue to monitor.

## 2018-10-02 NOTE — Progress Notes (Signed)
Speech Language Pathology Treatment: [name] Speaking valve  Patient Details Name: Logan Price MRN: [id number] DOB: 07/22/1945 Today's Date: 10/02/2018 Time: 1345-1401 SLP Time Calculation (min) (ACUTE ONLY): 16 min  Assessment / Plan / Recommendation Clinical Impression  Pt was seen for additional education and trials with PMV. Family was no longer available, but pt said that his brother picks things up quickly. SLP provided review of instructions for donning/doffing PMV and Min cues for pt to attempt himself multiple times. He has good clearance of secretions orally when PMV is in place. SLP provided information about safety precautions, when to wear valve, and how to clean it. Pt verbalized his understanding, although reinforcement is important. Pt has shown good carryover so far, but has a lot of new information to process. Recommend SLP f/u acutely and post-discharge for communication and swallowing.   HPI HPI: Pt is a 73 yo male admitted for planned trach and laryngeal biopsy 7/2. 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 Esophagram 6/26 showed silent gross aspiration of barium. PMH includes: HLD, HTN, hepatitis C, GERD, diverticulitis      SLP Plan  Continue with current plan of care       Recommendations  Diet recommendations: NPO Medication Administration: Via alternative means      Patient may use Passy-Muir Speech Valve: Intermittently with supervision PMSV Supervision: Full         Oral Care Recommendations: Oral care QID Follow up Recommendations: Outpatient SLP;Home health SLP SLP Visit Diagnosis: Dysphagia, unspecified (R13.10) Plan: Continue with current plan of care       GO                Virl Axe Madox Corkins 10/02/2018, 2:08 PM  Ivar Drape, M.A. CCC-SLP Acute Herbalist [phone] Office [phone]

## 2018-10-02 NOTE — Progress Notes (Signed)
Consulted with speech therapy, charge nurse, and RN 4 on unit about NG tube placement for barium swallow.  Concern was expressed over NG tube placement relative to patient's tracheostomy and right sided cancerous mass on larynx, especially over possible resistance and bleeding.  Wagner MD in IR was notified of these concerns via phone and he confirmed it was ok to proceed with nursing attempt to place NG.

## 2018-10-02 NOTE — Consult Note (Signed)
Chief Complaint: Patient was seen in consultation today for percutaneous gastric tube placement and Port a cath placement at the request of Dr Wardell Honour  Supervising Physician: Corrie Mckusick  Patient Status: [facility] - In-pt  History of Present Illness: Logan Price is a 73 y.o. male   New dx squamous cell cancer- glottis Malnutrition Hi risk aspiration Dysphagia Wt loss  Requesting Perc G tube and PAC placement asap  Imaging reviewed with Dr Laurence Ferrari IMPRESSION: 1. High-riding transverse colon which lies anterior to the gastric body and antrum. Percutaneous gastrostomy tube placement may be possible, however will require good visualization of the transverse colon at the time of attempted procedure. Consider administration of barium prior to the procedure. 2.  Aortic Atherosclerosis (ICD10-170.0). 3. Right lower lobe atelectasis versus changes of small volume aspiration. 4. Sludge and/or small stones in the gallbladder lumen.  I have spoken to pt Agreeable to move ahead Barium ordered for today Plan for G tube and [facility] 7/9   Past Medical History:  Diagnosis Date   Diverticulitis    False positive serological test for hepatitis C 12/13/2016   GERD (gastroesophageal reflux disease)    Hepatitis C    HTN (hypertension)    Hyperlipidemia     Past Surgical History:  Procedure Laterality Date   BIOPSY  05/04/2015   Procedure: BIOPSY;  Surgeon: Danie Binder, MD;  [facility];  Service: Endoscopy;;  bx's of ileocecal valve    COLONOSCOPY WITH PROPOFOL N/A 05/04/2015   Dr. Oneida Alar: normal appearing ileum with prominent IC valve with tubular adenomas, moderate diverticulosis in sigmoid colon, ascending colon, and retum. Moderate sized internal hemorrhoids. Surveillance in 5 years   ESOPHAGOGASTRODUODENOSCOPY (EGD) WITH PROPOFOL N/A 12/19/2016   Procedure: ESOPHAGOGASTRODUODENOSCOPY (EGD) WITH PROPOFOL;  Surgeon: Danie Binder, MD;  [facility];  Service: Endoscopy;  Laterality: N/A;  11:30am   FLEXIBLE SIGMOIDOSCOPY N/A 12/10/2015   hemorrhoid banding X 3    HEMORRHOID BANDING N/A 12/10/2015   Procedure: HEMORRHOID BANDING;  Surgeon: Danie Binder, MD;  [facility];  Service: Endoscopy;  Laterality: N/A;  1:30 PM   MICROLARYNGOSCOPY N/A 09/27/2018   Procedure: MICRO DIRECT LARYNGOSCOPY WITH BIOPSY;  Surgeon: Leta Baptist, MD;  Location: [facility];  Service: ENT;  Laterality: N/A;   None to date     As of 04/14/15   POLYPECTOMY  05/04/2015   Procedure: POLYPECTOMY;  Surgeon: Danie Binder, MD;  [facility];  Service: Endoscopy;;  descending colon polyp, ascending colon polyp   SAVORY DILATION N/A 12/19/2016   Procedure: SAVORY DILATION;  Surgeon: Danie Binder, MD;  [facility];  Service: Endoscopy;  Laterality: N/A;   TRACHEOSTOMY TUBE PLACEMENT N/A 09/27/2018   Procedure: AWAKE TRACHEOSTOMY;  Surgeon: Leta Baptist, MD;  Location: [facility];  Service: ENT;  Laterality: N/A;    Allergies: Patient has no known allergies.  Medications: Prior to Admission medications   Medication Sig Start Date End Date Taking? Authorizing Provider  amLODipine (NORVASC) 10 MG tablet TAKE 1 TABLET DAILY 08/05/18  Yes Hawks, Christy A, FNP  aspirin EC 81 MG tablet Take 81 mg by mouth daily.   Yes [provider]  Cholecalciferol (VITAMIN D) 2000 units CAPS Take 2,000 Units by mouth daily.   Yes [provider]  fluticasone (FLONASE) 50 MCG/ACT nasal spray SPRAY 2 SPRAYS INTO EACH NOSTRIL EVERY DAY Patient taking differently: Place 2 sprays into both nostrils daily.  08/21/18  Yes Hawks, Christy A, FNP  Multiple Vitamin (MULTIVITAMIN WITH MINERALS) TABS tablet Take 1 tablet by mouth daily.   Yes [provider]  pantoprazole (PROTONIX) 40 MG tablet 1 po 30 mins prior to first meal Patient taking differently: Take 40 mg by mouth daily. 1 po 30 mins prior to first meal 12/12/17  Yes Fields, Sandi L,  MD  simvastatin (ZOCOR) 20 MG tablet TAKE 1 TABLET AT BEDTIME 08/05/18  Yes Hawks, Theador Hawthorne, FNP     Family History  Problem Relation Age of Onset   Cancer Mother    Hypertension Father    Hypertension Sister    Hypertension Brother    Hypertension Sister    Aneurysm Brother 70       brain   Colon cancer Neg Hx    Colon polyps Neg Hx     Social History   Socioeconomic History   Marital status: Married    Spouse name: Not on file   Number of children: Not on file   Years of education: GED   Highest education level: GED or equivalent  Occupational History   Occupation: Retired    Comment: [employer]  Social Needs   Financial resource strain: Not hard at all   Food insecurity    Worry: Never true    Inability: Never true   Transportation needs    Medical: No    Non-medical: No  Tobacco Use   Smoking status: Former Smoker    Packs/day: 1.00    Years: 40.00    Pack years: 40.00    Types: Cigarettes    Quit date: 10/09/2006    Years since quitting: 11.9   Smokeless tobacco: Never Used   Tobacco comment: quit x 9 years  Substance and Sexual Activity   Alcohol use: Yes    Alcohol/week: 0.0 standard drinks    Comment: 7 oz of vodka per night   Drug use: No   Sexual activity: Yes    Birth control/protection: None  Lifestyle   Physical activity    Days per week: 0 days    Minutes per session: 0 min   Stress: Only a little  Relationships   Social connections    Talks on phone: More than three times a week    Gets together: More than three times a week    Attends religious service: Never    Active member of club or organization: No    Attends meetings of clubs or organizations: Never    Relationship status: Married  Other Topics Concern   Not on file  Social History Narrative   Not on file    Review of Systems: A 12 point ROS discussed and pertinent positives are indicated in the HPI above.  All other systems are  negative.  Review of Systems  Constitutional: Positive for activity change and unexpected weight change. Negative for fever.  HENT: Positive for trouble swallowing.   Respiratory: Negative for cough.   Cardiovascular: Negative for chest pain.  Gastrointestinal: Negative for abdominal pain.  Neurological: Positive for weakness.  Psychiatric/Behavioral: Negative for behavioral problems, confusion and decreased concentration.    Vital Signs: BP 132/75    Pulse (!) 106    Temp 98 F (36.7 C) (Oral)    Resp 17    Ht 5\' 9"  (1.753 m)    Wt 133 lb 6.1 oz (60.5 kg)    SpO2 95%    BMI 19.70 kg/m   Physical Exam  Vitals signs reviewed.  HENT:     Mouth/Throat:     Mouth: Mucous membranes are moist.  Neck:     Musculoskeletal: Muscular tenderness present.  Cardiovascular:     Rate and Rhythm: Normal rate and regular rhythm.     Heart sounds: Normal heart sounds.  Pulmonary:     Breath sounds: Normal breath sounds.  Abdominal:     Palpations: Abdomen is soft.  Musculoskeletal: Normal range of motion.  Skin:    General: Skin is warm and dry.  Neurological:     Mental Status: He is alert and oriented to person, place, and time.  Psychiatric:        Mood and Affect: Mood normal.        Behavior: Behavior normal.        Thought Content: Thought content normal.        Judgment: Judgment normal.     Imaging: Ct Abdomen Wo Contrast  Result Date: 10/01/2018 CLINICAL DATA:  73 year old male undergoing evaluation for gastrostomy tube placement. EXAM: CT ABDOMEN WITHOUT CONTRAST TECHNIQUE: Multidetector CT imaging of the abdomen was performed following the standard protocol without IV contrast. COMPARISON:  Prior abdominal ultrasound 12/25/2016 FINDINGS: Lower chest: Mild lower lobe bronchial wall thickening. Trace atelectasis versus infiltrate in the dependent portion of the right lower lobe. No suspicious pulmonary nodule or mass. The visualized heart is normal in size. Unremarkable distal  thoracic esophagus. Hepatobiliary: Normal hepatic contour and morphology. No discrete hepatic lesion. The gallbladder lumen is filled with high density material likely representing sludge and/or small stones. No biliary ductal dilatation. Pancreas: Unremarkable. No pancreatic ductal dilatation or surrounding inflammatory changes. Spleen: Normal in size without focal abnormality. Adrenals/Urinary Tract: Normal adrenal glands. Circumscribed low-attenuation lesions within the right kidney are incompletely characterized in the absence of intravenous contrast. However, a sonographically simple cyst was previously identified in the lower pole. No evidence of hydronephrosis or nephrolithiasis. Stomach/Bowel: High-riding transverse colon which lies anterior to the gastric body and antrum. Otherwise, normal gastric anatomy. No focal bowel wall thickening or evidence of obstruction. Vascular/Lymphatic: Limited evaluation in the absence of intravenous contrast. Extensive atherosclerotic vascular calcifications. No aneurysm. Other: No ascites or abdominal wall hernia. Musculoskeletal: No acute fracture or aggressive appearing lytic or blastic osseous lesion. IMPRESSION: 1. High-riding transverse colon which lies anterior to the gastric body and antrum. Percutaneous gastrostomy tube placement may be possible, however will require good visualization of the transverse colon at the time of attempted procedure. Consider administration of barium prior to the procedure. 2.  Aortic Atherosclerosis (ICD10-170.0). 3. Right lower lobe atelectasis versus changes of small volume aspiration. 4. Sludge and/or small stones in the gallbladder lumen. Electronically Signed   By: Jacqulynn Cadet M.D.   On: 10/01/2018 14:18   Ct Soft Tissue Neck W Contrast  Result Date: 09/28/2018 CLINICAL DATA:  73 year old male status post tracheostomy and laryngeal biopsy postoperative day 1. Findings suspicious for squamous cell carcinoma. EXAM: CT NECK  WITH CONTRAST TECHNIQUE: Multidetector CT imaging of the neck was performed using the standard protocol following the bolus administration of intravenous contrast. CONTRAST:  39mL OMNIPAQUE IOHEXOL 300 MG/ML  SOLN COMPARISON:  Neck CT 02/22/2018. FINDINGS: Pharynx and larynx: Tracheostomy has been placed, and there is a small volume of soft tissue gas tracking in the retropharyngeal space, in the bilateral neck, and into the visible mediastinum. The tube appears well positioned. There is debris in the cricoid above the tube. Trace retained secretions or debris in the trachea below  the tube. There is an infiltrative and fungating heterogeneously enhancing mass now occupying the larynx, chiefly at and above the glottis, but also possibly infiltrating and involving the cervical esophagus below the glottis (series 3, image 80). There is extension through the right thyroid cartilage and contiguous appearing tumor - probably ex nodal disease - in the right lower neck lateral to the carotid space (series 3, image 64). There is associated narrowing of the right IJ at that level, which remains patent. All told, the poorly marginated hyperenhancing tumor encompasses 35 x 76 x 66 millimeters (AP by transverse by CC). See series 3, image 63, 64, 74, and coronal images 51 through 54. Tumor extends to the AE folds and the mucosa hypopharynx is generally hyperenhancing. The oropharynx and nasopharynx are spared. Negative superior parapharyngeal spaces, the right lower paralaryngeal spaces are abnormal. The retropharyngeal space contains gas from the tracheostomy. Salivary glands: Negative sublingual space. The submandibular glands and parotid glands remain within normal limits. Thyroid: Postoperative changes from tracheostomy, but the posterior right thyroid lobe also appears abutted by tumor on series 3, image 84. Lymph nodes: Extensive right level 3 Aks nodal disease suspected, resulting in a semi contiguous appearance of tumor  from the level 3 station into the larynx. Malignant right level 2A lymph nodes measuring 16 millimeter short axis on series 3, image 47. No level 1 node involvement. No abnormal level 4 nodes are evident. There are small but conspicuous left level 2 B (series 3, image 40) and level IIIb (image 61) lymph nodes which are larger than in 2019, up to 7 millimeters diameter. Vascular: Mass effect on the right internal jugular vein from tumor in the right lower neck, but the vein remains patent. Tumor surrounds the right lower carotid space but the right carotid remains patent with calcified atherosclerosis. Other major vascular structures in the neck and at the skull base are patent. Limited intracranial: Negative. Visualized orbits: Negative. Mastoids and visualized paranasal sinuses: Clear. Skeleton: Hyperenhancing tumor appears to occupy the prevertebral space in the lower cervical spine from C4-C5 through C7-T1, and probably is associated with cervical esophageal involvement. See series 3, image 80 and sagittal image 52. No bone erosion identified. No lytic or suspicious osseous lesion identified elsewhere. Upper chest: Centrilobular emphysema. Stable small right apical nodules on series 9, image 88, and left apically nodule on image 94, which are likely related to lung scarring. Small volume pneumomediastinum following tracheostomy tube placement. Distal to the tube the trachea appears patent with some retained secretions. No superior mediastinal lymphadenopathy. Calcified aortic atherosclerosis. IMPRESSION: 1. Infiltrative and poorly marginated enhancing tumor in the lower neck eccentric to the right appears to involve the larynx, the adjacent cervical esophagus, and is somewhat contiguous with ex-nodal disease in the right level 3 station, surrounding the right carotid space and narrowing the right IJ which remains patent. - all told tumor encompasses 35 x 76 x 66 mm (AP by transverse by CC) - prevertebral space  involvement suspected C4-C5 through C7-T1 but no underlying bony changes. - malignant right level 2 nodal disease, and small but suspicious contralateral left level 2 and left level 3 nodes. 2. Status post tracheostomy with small volume pneumomediastinum and soft tissue gas in the neck. The airway appears patent below the tracheostomy tube and no adverse features are identified. Electronically Signed   By: Genevie Ann M.D.   On: 09/28/2018 17:34   Ct Chest W Contrast  Result Date: 09/29/2018 CLINICAL DATA:  Neck mass, advanced neck  cancer, status post tracheostomy. EXAM: CT CHEST WITH CONTRAST TECHNIQUE: Multidetector CT imaging of the chest was performed during intravenous contrast administration. CONTRAST:  32mL OMNIPAQUE IOHEXOL 300 MG/ML  SOLN COMPARISON:  09/28/2018 neck CT. FINDINGS: Cardiovascular: Normal heart size. No significant pericardial effusion/thickening. Three-vessel coronary atherosclerosis. Atherosclerotic nonaneurysmal thoracic aorta. Normal caliber pulmonary arteries. No central pulmonary emboli. Mediastinum/Nodes: No discrete thyroid nodules. Normal thoracic esophagus. Infiltrative right neck mass involving larynx and cervical esophagus, as detailed on neck CT from 1 day prior. Subcutaneous emphysema scattered in the lower neck bilaterally. Scattered pneumomediastinum throughout the bilateral paratracheal and paraesophageal regions. No pathologically enlarged axillary, mediastinal or hilar lymph nodes. Lungs/Pleura: No pneumothorax. No pleural effusion. Moderate centrilobular emphysema with diffuse bronchial wall thickening. Tracheostomy tube tip is in the tracheal lumen just below thoracic inlet. No acute consolidative airspace disease or lung masses. Scattered calcified granulomas throughout the medial right lower lobe. A few scattered solid pulmonary nodules in the upper lobes bilaterally, predominantly at the lung apices, largest 6 mm at the apical left upper lobe (series 5/image 17) and 5  mm in the apical right upper lobe (series 5/image 18), all stable since 02/22/2018 CT. Upper abdomen: No acute abnormality. Musculoskeletal: No aggressive appearing focal osseous lesions. Mild symmetric bilateral gynecomastia. Moderate thoracic spondylosis. IMPRESSION: 1. No thoracic adenopathy or other findings suspicious for metastatic disease in the chest. 2. Bilateral upper lobe solid pulmonary nodules, largest 6 mm, all stable since 02/22/2018 neck CT, more likely benign. Suggest follow-up chest CT in 12 months to demonstrate continued stability. 3. Infiltrative right neck mass involving the larynx and cervical esophagus, as detailed on neck CT from 1 day prior. 4. Well-positioned tracheostomy tube. 5. Three-vessel coronary atherosclerosis. Aortic Atherosclerosis (ICD10-I70.0) and Emphysema (ICD10-J43.9). Electronically Signed   By: Ilona Sorrel M.D.   On: 09/29/2018 13:41   Dg Esophagus W Double Cm (hd)  Result Date: 09/20/2018 CLINICAL DATA:  Pain at RIGHT side of throat neck for 2 years, feels like food is getting stuck in throat EXAM: ESOPHOGRAM / BARIUM SWALLOW / BARIUM TABLET STUDY TECHNIQUE: Combined double contrast and single contrast examination performed using effervescent crystals, thick barium liquid, and thin barium liquid. The patient was observed with fluoroscopy swallowing a 13 mm barium sulphate tablet. Exam was terminated prematurely due to findings. FLUOROSCOPY TIME:  Fluoroscopy Time:  1 minutes 18 seconds Radiation Exposure Index (if provided by the fluoroscopic device): 2.4 mGy Number of Acquired Spot Images: multiple fluoroscopic screen captures COMPARISON:  None FINDINGS: Esophageal distention: Normal distention without mass or stricture Filling defects:  See below 12.5 mm barium tablet: Passed from oral cavity to stomach without obstruction Motility:  Normal for age Mucosa:  Esophageal mucosa smooth.  Abnormal hypopharynx see below Hypopharynx/cervical esophagus: Laryngeal  penetration and silent gross aspiration of contrast into trachea. Contrast passed caudally into the RIGHT mainstem bronchus and RIGHT lower lobe medially with alveolar opacification. Large polypoid filling defect within the hypopharynx primarily to the RIGHT and anteriorly, extending from above the epiglottis to the RIGHT piriform sinus. Irregular macrolobulated border with scattered crevices. Epiglottis not clearly identified. Swallowed contrast courses along the LEFT lateral margin of the pharynx and hypopharynx, with the LEFT lateral wall mucosa appearing smooth. Narrowing at the hypopharynx on AP view. Hiatal hernia:  Absent GE reflux:  Not identified during exam Other:  N/A IMPRESSION: Large polypoid macrolobulated mass within hypopharynx, predominantly on RIGHT and extending from above epiglottis to the RIGHT piriform sinus, with loss of epiglottic silhouette. Associated narrowing of  the hypopharynx with deviation of swallowed liquids to the LEFT. 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 Gross laryngeal penetration and silent aspiration of contrast material into trachea, with contrast coursing inferiorly into RIGHT lower lobe bronchus and alveoli. Electronically Signed   By: Lavonia Dana M.D.   On: 09/20/2018 11:13    Labs:  CBC: Recent Labs    10/23/17 0905 04/25/18 0919 09/27/18 0748  WBC 6.1 6.5 8.7  HGB 13.5 14.3 14.2  HCT 40.1 43.7 43.6  PLT 258 273 329    COAGS: No results for input(s): INR, APTT in the last 8760 hours.  BMP: Recent Labs    10/23/17 0905 02/22/18 1132 04/25/18 0919 09/27/18 0748  NA 142  --  145* 139  K 3.4*  --  4.7 3.9  CL 100  --  101 100  CO2 26  --  26 30  GLUCOSE 99  --  109* 112*  BUN 8  --  12 11  CALCIUM 8.9  --  9.5 9.4  CREATININE 0.90 1.10 1.00 0.92  GFRNONAA 86  --  75 >60  GFRAA 100  --  87 >60    LIVER FUNCTION TESTS: Recent Labs    10/23/17 0905 04/25/18 0919  BILITOT 0.4 0.4  AST 20 21  ALT 11 16  ALKPHOS  49 60  PROT 6.7 7.2  ALBUMIN 4.4 4.6    TUMOR MARKERS: No results for input(s): AFPTM, CEA, CA199, CHROMGRNA in the last 8760 hours.  Assessment and Plan:  Squamous cell cancer- glottis Dysphagia; wt loss Risk for aspiration To start chemo asap For percutaneous gastric tube and PAC in IR 7/9 Getting barium po/ng today to opacify bowel in preparation for G tube Plan for both procedure 7/9 in IR Risks and benefits discussed with the patient including, but not limited to the need for a barium enema during the procedure, bleeding, infection, peritonitis, or damage to adjacent structures. All of the patient's questions were answered, patient is agreeable to proceed. Consent signed and in chart.  Risks and benefits of image guided port-a-catheter placement was discussed with the patient including, but not limited to bleeding, infection, pneumothorax, or fibrin sheath development and need for additional procedures. All of the patient's questions were answered, patient is agreeable to proceed. Consent signed and in chart.  Thank you for this interesting consult.  I greatly enjoyed meeting Logan Price and look forward to participating in their care.  A copy of this report was sent to the requesting provider on this date.  Electronically Signed: Lavonia Drafts, PA-C 10/02/2018, 9:04 AM   I spent a total of 40 Minutes    in face to face in clinical consultation, greater than 50% of which was counseling/coordinating care for [facility] and G tube

## 2018-10-02 NOTE — Progress Notes (Signed)
Attending re-notified concerning giving flomax PO and patient being NPO with a new trach. Pt has not yet eaten or drank since procedure speech recommending patient be NPO due to high risk for aspiration. Flomax not given at this time. MD aware. Verbal orders given for a third nurse to attempt. Attempted with no success. Blood on the tip of cath during 2nd and 3rd insertion. Bladder scan now @ 604 mL.  Attending re-notified of no urine return x3 verbal orders given to insert coude cath. Will continue to monitor.

## 2018-10-03 ENCOUNTER — Inpatient Hospital Stay (HOSPITAL_COMMUNITY): Payer: Medicare Other

## 2018-10-03 DIAGNOSIS — C320 Malignant neoplasm of glottis: Secondary | ICD-10-CM

## 2018-10-03 DIAGNOSIS — R9431 Abnormal electrocardiogram [ECG] [EKG]: Secondary | ICD-10-CM

## 2018-10-03 DIAGNOSIS — I471 Supraventricular tachycardia: Secondary | ICD-10-CM | POA: Diagnosis present

## 2018-10-03 DIAGNOSIS — I4891 Unspecified atrial fibrillation: Secondary | ICD-10-CM | POA: Insufficient documentation

## 2018-10-03 DIAGNOSIS — R1312 Dysphagia, oropharyngeal phase: Secondary | ICD-10-CM

## 2018-10-03 DIAGNOSIS — J387 Other diseases of larynx: Secondary | ICD-10-CM

## 2018-10-03 LAB — CBC
HCT: 37.6 % — ABNORMAL LOW (ref 39.0–52.0)
Hemoglobin: 12.5 g/dL — ABNORMAL LOW (ref 13.0–17.0)
MCH: 29.1 pg (ref 26.0–34.0)
MCHC: 33.2 g/dL (ref 30.0–36.0)
MCV: 87.4 fL (ref 80.0–100.0)
Platelets: 334 10*3/uL (ref 150–400)
RBC: 4.3 MIL/uL (ref 4.22–5.81)
RDW: 13.4 % (ref 11.5–15.5)
WBC: 10.7 10*3/uL — ABNORMAL HIGH (ref 4.0–10.5)
nRBC: 0 % (ref 0.0–0.2)

## 2018-10-03 LAB — BASIC METABOLIC PANEL
Anion gap: 9 (ref 5–15)
BUN: <5 mg/dL — ABNORMAL LOW (ref 8–23)
CO2: 25 mmol/L (ref 22–32)
Calcium: 8.9 mg/dL (ref 8.9–10.3)
Chloride: 101 mmol/L (ref 98–111)
Creatinine, Ser: 0.87 mg/dL (ref 0.61–1.24)
GFR calc Af Amer: >60 mL/min (ref >60)
GFR calc non Af Amer: >60 mL/min (ref >60)
Glucose, Bld: 127 mg/dL — ABNORMAL HIGH (ref 70–99)
Potassium: 4 mmol/L (ref 3.5–5.1)
Sodium: 135 mmol/L (ref 135–145)

## 2018-10-03 LAB — ECHOCARDIOGRAM COMPLETE
Height: 69 in
Weight: 2134.05 oz

## 2018-10-03 LAB — PROTIME-INR
INR: 1.2 (ref 0.8–1.2)
Prothrombin Time: 14.8 seconds (ref 11.4–15.2)

## 2018-10-03 LAB — MAGNESIUM: Magnesium: 1.7 mg/dL (ref 1.7–2.4)

## 2018-10-03 LAB — PHOSPHORUS: Phosphorus: 3.9 mg/dL (ref 2.5–4.6)

## 2018-10-03 LAB — TSH: TSH: 2.426 u[IU]/mL (ref 0.350–4.500)

## 2018-10-03 MED ORDER — ADENOSINE 6 MG/2ML IV SOLN
6.0000 mg | Freq: Once | INTRAVENOUS | Status: AC
  Administered 2018-10-03: 6 mg via INTRAVENOUS
  Filled 2018-10-03: qty 2

## 2018-10-03 MED ORDER — MAGNESIUM SULFATE 2 GM/50ML IV SOLN
2.0000 g | Freq: Once | INTRAVENOUS | Status: AC
  Administered 2018-10-03: 2 g via INTRAVENOUS
  Filled 2018-10-03: qty 50

## 2018-10-03 MED ORDER — METOPROLOL TARTRATE 5 MG/5ML IV SOLN
5.0000 mg | Freq: Four times a day (QID) | INTRAVENOUS | Status: DC
  Administered 2018-10-03 – 2018-10-08 (×18): 5 mg via INTRAVENOUS
  Filled 2018-10-03 (×19): qty 5

## 2018-10-03 NOTE — Consult Note (Addendum)
Medical Consultation   Logan Price  WUJ:[id number]  DOB: 1945/09/09  DOA: 09/27/2018  PCP: [name], FNP   Requesting physician: Dr. Suszanne Conners  Reason for consultation: Tachycardia   History of Present Illness: Logan Price is an 73 y.o. male with HTN, GERD, laryngeal mass.  Patient admitted to hospital for tracheostomy and Biopsy of laryngeal mass, which was performed on 7/3.  This has confirmed squamous cell carcinoma.  Currently planned for PEG placement today.  2 days ago patient had SVT which he converted out of spontaneously.  This morning he went back into a rapid narrow complex rhythm with rate of 160 and remained there.  Hospitalist asked to consult.  Patient is awake, alert.  He denies CP or SOB, denies ever having anything like this happen before this hospital stay or history of heart problems.   Review of Systems:  ROS As per HPI otherwise 10 point review of systems negative.     Past Medical History: Past Medical History:  Diagnosis Date  . Diverticulitis   . 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 GERD (gastroesophageal reflux disease)   . Hepatitis C   . HTN (hypertension)   . Hyperlipidemia     Past Surgical History: Past Surgical History:  Procedure Laterality Date  . BIOPSY  05/04/2015   Procedure: BIOPSY;  Surgeon: West Bali, MD;  [facility];  Service: Endoscopy;;  bx's of ileocecal valve   . COLONOSCOPY WITH PROPOFOL N/A 05/04/2015   Dr. Darrick Penna: normal appearing ileum with prominent IC valve with tubular adenomas, moderate diverticulosis in sigmoid colon, ascending colon, and retum. Moderate sized internal hemorrhoids. Surveillance in 5 years  . ESOPHAGOGASTRODUODENOSCOPY (EGD) WITH PROPOFOL N/A 12/19/2016   Procedure: ESOPHAGOGASTRODUODENOSCOPY (EGD) WITH PROPOFOL;  Surgeon: West Bali, MD;  [facility];  Service: Endoscopy;  Laterality: N/A;  11:30am  . FLEXIBLE  SIGMOIDOSCOPY N/A 12/10/2015   hemorrhoid banding X 3   . HEMORRHOID BANDING N/A 12/10/2015   Procedure: HEMORRHOID BANDING;  Surgeon: West Bali, MD;  [facility];  Service: Endoscopy;  Laterality: N/A;  1:30 PM  . MICROLARYNGOSCOPY N/A 09/27/2018   Procedure: MICRO DIRECT LARYNGOSCOPY WITH BIOPSY;  Surgeon: Newman Pies, MD;  Location: [facility] OR;  Service: ENT;  Laterality: N/A;  . None to date     As of 04/14/15  . POLYPECTOMY  05/04/2015   Procedure: POLYPECTOMY;  Surgeon: West Bali, MD;  [facility];  Service: Endoscopy;;  descending colon polyp, ascending colon polyp  . SAVORY DILATION N/A 12/19/2016   Procedure: SAVORY DILATION;  Surgeon: West Bali, MD;  [facility];  Service: Endoscopy;  Laterality: N/A;  . TRACHEOSTOMY TUBE PLACEMENT N/A 09/27/2018   Procedure: AWAKE TRACHEOSTOMY;  Surgeon: Newman Pies, MD;  Location: [facility] OR;  Service: ENT;  Laterality: N/A;     Allergies:  No Known Allergies   Social History:  reports that he quit smoking about 11 years ago. His smoking use included cigarettes. He has a 40.00 pack-year smoking history. He has never used smokeless tobacco. He reports current alcohol use. He reports that he does not use drugs.   Family History: Family History  Problem Relation Age of Onset  . Cancer Mother   . Hypertension Father   . Hypertension Sister   . Hypertension Brother   . Hypertension Sister   .  Aneurysm Brother 32       brain  . Colon cancer Neg Hx   . Colon polyps Neg Hx       Physical Exam: Vitals:   10/02/18 2359 10/03/18 0340 10/03/18 0402 10/03/18 0425  BP:  (!) 148/88  111/83  Pulse: 89 (!) 153 84 75  Resp: 16 (!) 24 (!) 24 (!) 27  Temp:  97.8 F (36.6 C)    TempSrc:  Oral    SpO2: 94% 93% 92% (!) 88%  Weight:      Height:        Constitutional:  Alert and awake, oriented x3, not in any acute distress. Eyes: PERLA, EOMI, irises appear normal, anicteric sclera,  ENMT: external ears and nose  appear normal            Lips appears normal, oropharynx mucosa, tongue, posterior pharynx appear normal  Neck: neck appears normal, no masses, normal ROM, no thyromegaly, no JVD  CVS: Tachycardic Respiratory:  clear to auscultation bilaterally, no wheezing, rales or rhonchi. Respiratory effort normal. No accessory muscle use.  Abdomen: soft nontender, nondistended, normal bowel sounds, no hepatosplenomegaly, no hernias  Musculoskeletal: : no cyanosis, clubbing or edema noted bilaterally Neuro: Cranial nerves II-XII intact, strength, sensation, reflexes Psych: judgement and insight appear normal, stable mood and affect, mental status Skin: no rashes or lesions or ulcers, no induration or nodules    Data reviewed:  I have personally reviewed following labs and imaging studies Labs:  CBC: Recent Labs  Lab 09/27/18 0748 10/03/18 0400  WBC 8.7 10.7*  HGB 14.2 12.5*  HCT 43.6 37.6*  MCV 89.5 87.4  PLT 329 334    Basic Metabolic Panel: Recent Labs  Lab 09/27/18 0748 10/03/18 0400  NA 139 135  K 3.9 4.0  CL 100 101  CO2 30 25  GLUCOSE 112* 127*  BUN 11 <5*  CREATININE 0.92 0.87  CALCIUM 9.4 8.9   GFR Estimated Creatinine Clearance: 66.6 mL/min (by C-G formula based on SCr of 0.87 mg/dL). Liver Function Tests: No results for input(s): AST, ALT, ALKPHOS, BILITOT, PROT, ALBUMIN in the last 168 hours. No results for input(s): LIPASE, AMYLASE in the last 168 hours. No results for input(s): AMMONIA in the last 168 hours. Coagulation profile Recent Labs  Lab 10/03/18 0400  INR 1.2    Cardiac Enzymes: No results for input(s): CKTOTAL, CKMB, CKMBINDEX, TROPONINI in the last 168 hours. BNP: Invalid input(s): POCBNP CBG: No results for input(s): GLUCAP in the last 168 hours. D-Dimer No results for input(s): DDIMER in the last 72 hours. Hgb A1c No results for input(s): HGBA1C in the last 72 hours. Lipid Profile No results for input(s): CHOL, HDL, LDLCALC, TRIG, CHOLHDL,  LDLDIRECT in the last 72 hours. Thyroid function studies No results for input(s): TSH, T4TOTAL, T3FREE, THYROIDAB in the last 72 hours.  Invalid input(s): FREET3 Anemia work up No results for input(s): VITAMINB12, FOLATE, FERRITIN, TIBC, IRON, RETICCTPCT in the last 72 hours. Urinalysis    Component Value Date/Time   COLORURINE YELLOW 03/30/2015 1330   APPEARANCEUR CLEAR 03/30/2015 1330   LABSPEC >1.030 (H) 03/30/2015 1330   PHURINE 5.5 03/30/2015 1330   GLUCOSEU NEGATIVE 03/30/2015 1330   HGBUR TRACE (A) 03/30/2015 1330   BILIRUBINUR NEGATIVE 03/30/2015 1330   KETONESUR NEGATIVE 03/30/2015 1330   PROTEINUR NEGATIVE 03/30/2015 1330   NITRITE NEGATIVE 03/30/2015 1330   LEUKOCYTESUR NEGATIVE 03/30/2015 1330     Microbiology Recent Results (from the past 240 hour(s))  SARS Coronavirus  2 (CEPHEID - Performed in [facility] hospital lab), Hosp Order     Status: None   Collection Time: 09/27/18  7:49 AM   Specimen: Nasopharyngeal Swab  Result Value Ref Range Status   SARS Coronavirus 2 NEGATIVE NEGATIVE Final    Comment: (NOTE) If result is NEGATIVE SARS-CoV-2 target nucleic acids are NOT DETECTED. The SARS-CoV-2 RNA is generally detectable in upper and lower  respiratory specimens during the acute phase of infection. The lowest  concentration of SARS-CoV-2 viral copies this assay can detect is 250  copies / mL. A negative result does not preclude SARS-CoV-2 infection  and should not be used as the sole basis for treatment or other  patient management decisions.  A negative result may occur with  improper specimen collection / handling, submission of specimen other  than nasopharyngeal swab, presence of viral mutation(s) within the  areas targeted by this assay, and inadequate number of viral copies  (<250 copies / mL). A negative result must be combined with clinical  observations, patient history, and epidemiological information. If result is POSITIVE SARS-CoV-2 target  nucleic acids are DETECTED. The SARS-CoV-2 RNA is generally detectable in upper and lower  respiratory specimens dur ing the acute phase of infection.  Positive  results are indicative of active infection with SARS-CoV-2.  Clinical  correlation with patient history and other diagnostic information is  necessary to determine patient infection status.  Positive results do  not rule out bacterial infection or co-infection with other viruses. If result is PRESUMPTIVE POSTIVE SARS-CoV-2 nucleic acids MAY BE PRESENT.   A presumptive positive result was obtained on the submitted specimen  and confirmed on repeat testing.  While 2019 novel coronavirus  (SARS-CoV-2) nucleic acids may be present in the submitted sample  additional confirmatory testing may be necessary for epidemiological  and / or clinical management purposes  to differentiate between  SARS-CoV-2 and other Sarbecovirus currently known to infect humans.  If clinically indicated additional testing with an alternate test  methodology [phone]) is advised. The SARS-CoV-2 RNA is generally  detectable in upper and lower respiratory sp ecimens during the acute  phase of infection. The expected result is Negative. Fact Sheet for Patients:  BoilerBrush.com.cy Fact Sheet for Healthcare Providers: https://pope.com/ This test is not yet approved or cleared by the [place] FDA and has been authorized for detection and/or diagnosis of SARS-CoV-2 by FDA under an Emergency Use Authorization (EUA).  This EUA will remain in effect (meaning this test can be used) for the duration of the COVID-19 declaration under Section 564(b)(1) of the Act, 21 U.S.C. section 360bbb-3(b)(1), unless the authorization is terminated or revoked sooner. Performed at [facility], [address] [place], [place] [address]   SARS Coronavirus 2 (CEPHEID - Performed in [facility] hospital lab), Hosp Order      Status: None   Collection Time: 10/02/18 11:06 AM   Specimen: Nasopharyngeal Swab  Result Value Ref Range Status   SARS Coronavirus 2 NEGATIVE NEGATIVE Final    Comment: (NOTE) If result is NEGATIVE SARS-CoV-2 target nucleic acids are NOT DETECTED. The SARS-CoV-2 RNA is generally detectable in upper and lower  respiratory specimens during the acute phase of infection. The lowest  concentration of SARS-CoV-2 viral copies this assay can detect is 250  copies / mL. A negative result does not preclude SARS-CoV-2 infection  and should not be used as the sole basis for treatment or other  patient management decisions.  A negative result may  occur with  improper specimen collection / handling, submission of specimen other  than nasopharyngeal swab, presence of viral mutation(s) within the  areas targeted by this assay, and inadequate number of viral copies  (<250 copies / mL). A negative result must be combined with clinical  observations, patient history, and epidemiological information. If result is POSITIVE SARS-CoV-2 target nucleic acids are DETECTED. The SARS-CoV-2 RNA is generally detectable in upper and lower  respiratory specimens dur ing the acute phase of infection.  Positive  results are indicative of active infection with SARS-CoV-2.  Clinical  correlation with patient history and other diagnostic information is  necessary to determine patient infection status.  Positive results do  not rule out bacterial infection or co-infection with other viruses. If result is PRESUMPTIVE POSTIVE SARS-CoV-2 nucleic acids MAY BE PRESENT.   A presumptive positive result was obtained on the submitted specimen  and confirmed on repeat testing.  While 2019 novel coronavirus  (SARS-CoV-2) nucleic acids may be present in the submitted sample  additional confirmatory testing may be necessary for epidemiological  and / or clinical management purposes  to differentiate between  SARS-CoV-2 and other  Sarbecovirus currently known to infect humans.  If clinically indicated additional testing with an alternate test  methodology [phone]) is advised. The SARS-CoV-2 RNA is generally  detectable in upper and lower respiratory sp ecimens during the acute  phase of infection. The expected result is Negative. Fact Sheet for Patients:  BoilerBrush.com.cy Fact Sheet for Healthcare Providers: https://pope.com/ This test is not yet approved or cleared by the [place] FDA and has been authorized for detection and/or diagnosis of SARS-CoV-2 by FDA under an Emergency Use Authorization (EUA).  This EUA will remain in effect (meaning this test can be used) for the duration of the COVID-19 declaration under Section 564(b)(1) of the Act, 21 U.S.C. section 360bbb-3(b)(1), unless the authorization is terminated or revoked sooner. Performed at [facility], [address] [place], [place] [address]        Inpatient Medications:   Scheduled Meds: . chlorhexidine  15 mL Mouth Rinse BID  . lidocaine  1 application Urethral Once  . mouth rinse  15 mL Mouth Rinse q12n4p  . tamsulosin  0.4 mg Oral Once   Continuous Infusions: .  ceFAZolin (ANCEF) IV    . dextrose 5 % and 0.45 % NaCl with KCl 20 mEq/L 100 mL/hr at 10/02/18 2000     Radiological Exams on Admission: Ct Abdomen Wo Contrast  Result Date: 10/01/2018 CLINICAL DATA:  73 year old male undergoing evaluation for gastrostomy tube placement. EXAM: CT ABDOMEN WITHOUT CONTRAST TECHNIQUE: Multidetector CT imaging of the abdomen was performed following the standard protocol without IV contrast. COMPARISON:  Prior abdominal ultrasound 12/25/2016 FINDINGS: Lower chest: Mild lower lobe bronchial wall thickening. Trace atelectasis versus infiltrate in the dependent portion of the right lower lobe. No suspicious pulmonary nodule or mass. The visualized heart is normal in size. Unremarkable distal  thoracic esophagus. Hepatobiliary: Normal hepatic contour and morphology. No discrete hepatic lesion. The gallbladder lumen is filled with high density material likely representing sludge and/or small stones. No biliary ductal dilatation. Pancreas: Unremarkable. No pancreatic ductal dilatation or surrounding inflammatory changes. Spleen: Normal in size without focal abnormality. Adrenals/Urinary Tract: Normal adrenal glands. Circumscribed low-attenuation lesions within the right kidney are incompletely characterized in the absence of intravenous contrast. However, a sonographically simple cyst was previously identified in the lower pole. No evidence of hydronephrosis or nephrolithiasis. Stomach/Bowel: High-riding transverse colon which  lies anterior to the gastric body and antrum. Otherwise, normal gastric anatomy. No focal bowel wall thickening or evidence of obstruction. Vascular/Lymphatic: Limited evaluation in the absence of intravenous contrast. Extensive atherosclerotic vascular calcifications. No aneurysm. Other: No ascites or abdominal wall hernia. Musculoskeletal: No acute fracture or aggressive appearing lytic or blastic osseous lesion. IMPRESSION: 1. High-riding transverse colon which lies anterior to the gastric body and antrum. Percutaneous gastrostomy tube placement may be possible, however will require good visualization of the transverse colon at the time of attempted procedure. Consider administration of barium prior to the procedure. 2.  Aortic Atherosclerosis (ICD10-170.0). 3. Right lower lobe atelectasis versus changes of small volume aspiration. 4. Sludge and/or small stones in the gallbladder lumen. Electronically Signed   By: Malachy Moan M.D.   On: 10/01/2018 14:18   Dg Abd 1 View  Result Date: 10/02/2018 CLINICAL DATA:  Enteric tube placement. EXAM: ABDOMEN - 1 VIEW COMPARISON:  CT abdomen from yesterday. FINDINGS: Enteric tube tip in the stomach with the proximal side port in the  lower esophagus. Nonobstructive bowel gas pattern. No acute osseous abnormality. IMPRESSION: 1. Enteric tube tip in the stomach with proximal side port in the lower esophagus. Recommend advancing 7-8 cm. Electronically Signed   By: Obie Dredge M.D.   On: 10/02/2018 18:59   Dg Abd Portable 1v  Result Date: 10/02/2018 CLINICAL DATA:  Nasogastric tube advancement. EXAM: PORTABLE ABDOMEN - 1 VIEW COMPARISON:  10/02/2018 at 1839 hours FINDINGS: The nasogastric tube has been advanced. Tip now extends further into the stomach with the side hole entering the proximal stomach. Normal bowel gas pattern. IMPRESSION: 1. Advanced nasogastric tube, now fully within the stomach. Electronically Signed   By: Amie Portland M.D.   On: 10/02/2018 20:45    Impression/Recommendations Principal Problem:   Laryngeal mass Active Problems:   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HCC)   Goals of care, counseling/discussion   Dysphagia   Loss of weight   PSVT (paroxysmal supraventricular tachycardia) (HCC)  1. PSVT - 1. Narrow complex tachycardia with rate of 160, vagal maneuvers not attempted secondary to trach and trach collar being in place, administered Adenosine 6mg  IV push X1 to patient (pads on), this resulted in successful conversion to NSR with rate 100, now mildly tachycardic with rate 90-110 sinus. 2. BMP shows no obvious electrolyte abnormality that can cause this. 3. Checking Mg and PO4 4. Patient BP post conversion is 139/74 with MAP 94 5. Since there have been multiple episodes now during stay, will try to prevent further episodes by putting patient on lopressor 5mg  IV Q6H (Patient NPO until PEG tube placed), once peg tube is placed, would put him on metoprolol tartrate 25mg  PO BID. 2. Laryngeal cancer - 1. Squamous cell carcinoma per Bx 2. Trach in place 3. Peg currently planned for today. 4. ENT primary.   Thank you for this consultation.  Our [facility] hospitalist team will follow the patient with you.   Time Spent:  80 min  GARDNER, JARED M. D.O. [facility] 10/03/2018, 4:56 AM

## 2018-10-03 NOTE — TOC Initial Note (Addendum)
Transition of Care [facility]) - Initial/Assessment Note    Patient Details  Name: Logan Price MRN: [id number] Date of Birth: Sep 23, 1945  [facility] CM/SW Contact:    Lawerance Sabal, RN Phone Number: 10/03/2018, 11:52 AM  Clinical Narrative:                 [name] w patient's wife on the phone to discuss her comfort level with caringfor trach, and upcoming Gtube. She states that she feels comfortable, she has been in for teaching and is planning on coming in tomorrow for more teaching after Gtube is placed. We discussed home support, she states her daughter is also going to assisting with care. She is ready for patient to return home, she expressed missing him. Discussed hh providers, and medicare ratings. Initially tried [employer] for home first but they cannot support trach care. Spoke wife again and she is agreeable to any 4/5 star rated company. [facility] able to accept case, referral accepted by Lupita Leash.  WILL NEED-  1-orders for DME for trach supplies, currently has 6mm uncuffed shiley. Order form from [facility] will also need signed by MDs.  2- Gtube supplies and feeds, will needs recs from RD after Gtube placed tomorrow    Expected Discharge Plan: Home w Home Health Services Barriers to Discharge: Continued Medical Work up   Patient Goals and CMS Choice Patient states their goals for this hospitalization and ongoing recovery are:: to return home CMS Medicare.gov Compare Post Acute Care list provided to:: Other (Comment Required)(spouse) Choice offered to / list presented to : Patient, Spouse  Expected Discharge Plan and Services Expected Discharge Plan: Home w Home Health Services   Discharge Planning Services: CM Consult Post Acute Care Choice: Home Health                             HH Arranged: RN, PT, OT, Nurse's Aide, Social Work [employer] Agency: [facility] Date HH Agency Contacted: 10/03/18 Time HH Agency Contacted: 1152 Representative spoke with  at [facility] Agency: Lupita Leash  Prior Living Arrangements/Services   Lives with:: Spouse                   Activities of Daily Living Home Assistive Devices/Equipment: None ADL Screening (condition at time of admission) Patient's cognitive ability adequate to safely complete daily activities?: Yes Is the patient deaf or have difficulty hearing?: No Does the patient have difficulty seeing, even when wearing glasses/contacts?: No Does the patient have difficulty concentrating, remembering, or making decisions?: No Patient able to express need for assistance with ADLs?: No Does the patient have difficulty dressing or bathing?: No Independently performs ADLs?: Yes (appropriate for developmental age) Does the patient have difficulty walking or climbing stairs?: No Weakness of Legs: None Weakness of Arms/Hands: None  Permission Sought/Granted                  Emotional Assessment              Admission diagnosis:  Laryngeal Mass Patient Active Problem List   Diagnosis Date Noted  . PSVT (paroxysmal supraventricular tachycardia) (HCC) 10/03/2018  . Atrial fibrillation (HCC) 10/03/2018  . Goals of care, counseling/discussion   . Dysphagia   . Loss of weight   . Laryngeal mass 09/27/2018  . 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HCC) 09/27/2018  . Gastritis determined by endoscopy   . Esophageal dysphagia   . False positive serological test for hepatitis C  12/13/2016  . Globus sensation 12/12/2016  . Smokes with greater than 40 pack year history 10/20/2016  . Hemorrhoids, internal, with bleeding 10/06/2015  . History of adenomatous polyp of colon 04/14/2015  . Vitamin D deficiency 10/09/2014  . HLD (hyperlipidemia) 10/08/2012  . GERD (gastroesophageal reflux disease) 10/08/2012  . HTN (hypertension)   . Diverticulitis 10/25/2009   PCP:  [name], FNP Pharmacy:   [phone] [place] - [address] [place] [address] Phone:  [phone] Fax: [phone]  [facility] [place], [place] - [address] AT Portal to Registered [employer] Sites [address] [place] [address] Phone: [phone] Fax: [phone]     Social Determinants of Health (SDOH) Interventions    Readmission Risk Interventions No flowsheet data found.

## 2018-10-03 NOTE — Progress Notes (Signed)
Spoke with patient's wife and she was updated on patient status. Wife states she will call tomorrow and schedule a time for trach teaching after his procedure.   Emelda Fear, RN

## 2018-10-03 NOTE — Progress Notes (Signed)
Dr Alcario Drought on the floor wishes to give 6 of adenosine, pt placed on Zoll, MD in the room and pt instructed on what to expect 6 of adenosine given brought pt's HR down from 160's to 90's pt tolerated well will continue to monitor

## 2018-10-03 NOTE — Progress Notes (Signed)
PROGRESS NOTE    Logan Price  LOV:[id number] DOB: 1945/08/25 DOA: 09/27/2018 PCP: [name], FNP   Brief Narrative:  73 year old with history of GERD, essential hypertension, hyperlipidemia who recently diagnosed neck mass concerning for malignancy was admitted to the hospital.  Overnight patient went into SVT therefore medical team was consulted.  Patient was given adenosine x1 which converted back to normal sinus rhythm.   Assessment & Plan:   Principal Problem:   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HCC) Active Problems:   Laryngeal mass   Goals of care, counseling/discussion   Dysphagia   Loss of weight   PSVT (paroxysmal supraventricular tachycardia) (HCC)   Atrial fibrillation (HCC)   Paroxysmal supraventricular tachycardia -Unknown exact etiology.  So narrow complex tachycardia with regular rate.  Received 1 dose of adenosine, now in normal sinus rhythm with heart rate in 90s. -Check TSH, echocardiogram ordered -Closely monitor electrolytes and replete as necessary. -On IV Lopressor PRN, will start low-dose p.o. Lopressor once PEG tube has been placed via PEG tube.  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  Plans to place PEG tube by ENT team. -Nutrition team is following.  We will continue to follow along patient during the hospitalization.  Please reach out if any further questions in the meantime.  I have also spoken with the nursing staff and she is aware of the plan.    Subjective: Patient remains in normal sinus rhythm this morning.  Heart rate is controlled after receiving adenosine overnight.  No complaints.  Review of Systems Otherwise negative except as per HPI, including: General: Denies fever, chills, night sweats or unintended weight loss. Resp: Denies cough, wheezing, shortness of breath. Cardiac: Denies chest pain, palpitations, orthopnea, paroxysmal nocturnal dyspnea. GI: Denies abdominal pain,  nausea, vomiting, diarrhea or constipation GU: Denies dysuria, frequency, hesitancy or incontinence MS: Denies muscle aches, joint pain or swelling Neuro: Denies headache, neurologic deficits (focal weakness, numbness, tingling), abnormal gait Psych: Denies anxiety, depression, SI/HI/AVH Skin: Denies new rashes or lesions ID: Denies sick contacts, exotic exposures, travel  Objective: Vitals:   10/03/18 0440 10/03/18 0530 10/03/18 0738 10/03/18 0824  BP: 117/90 117/72  123/72  Pulse: (!) 159 89 97 84  Resp: (!) 27 18 17 10   Temp:    98.1 F (36.7 C)  TempSrc:    Oral  SpO2: 91% 95% 97% 97%  Weight:      Height:        Intake/Output Summary (Last 24 hours) at 10/03/2018 1125 Last data filed at 10/03/2018 0724 Gross per 24 hour  Intake 1543.14 ml  Output 800 ml  Net 743.14 ml   Filed Weights   09/27/18 0848 09/28/18 0600  Weight: 63 kg 60.5 kg    Examination:  General exam: Appears calm and comfortable  Respiratory system: Clear to auscultation. Respiratory effort normal.  Tracheostomy in place Cardiovascular system: S1 & S2 heard, RRR. No JVD, murmurs, rubs, gallops or clicks. No pedal edema. Gastrointestinal system: Abdomen is nondistended, soft and nontender. No organomegaly or masses felt. Normal bowel sounds heard. Central nervous system: Alert and oriented. No focal neurological deficits. Extremities: Symmetric 5 x 5 power. Skin: No rashes, lesions or ulcers Psychiatry: Judgement and insight appear normal. Mood & affect appropriate.   Unable to speak due to tracheostomy but clearly able to communicate by lip reading.  Mentating well overall otherwise.   Data Reviewed:   CBC: Recent Labs  Lab 09/27/18 0748 10/03/18 0400  WBC 8.7 10.7*  HGB 14.2 12.5*  HCT 43.6 37.6*  MCV 89.5 87.4  PLT 329 334   Basic Metabolic Panel: Recent Labs  Lab 09/27/18 0748 10/03/18 0400  NA 139 135  K 3.9 4.0  CL 100 101  CO2 30 25  GLUCOSE 112* 127*  BUN 11 <5*    CREATININE 0.92 0.87  CALCIUM 9.4 8.9  MG  --  1.7  PHOS  --  3.9   GFR: Estimated Creatinine Clearance: 66.6 mL/min (by C-G formula based on SCr of 0.87 mg/dL). Liver Function Tests: No results for input(s): AST, ALT, ALKPHOS, BILITOT, PROT, ALBUMIN in the last 168 hours. No results for input(s): LIPASE, AMYLASE in the last 168 hours. No results for input(s): AMMONIA in the last 168 hours. Coagulation Profile: Recent Labs  Lab 10/03/18 0400  INR 1.2   Cardiac Enzymes: No results for input(s): CKTOTAL, CKMB, CKMBINDEX, TROPONINI in the last 168 hours. BNP (last 3 results) No results for input(s): PROBNP in the last 8760 hours. HbA1C: No results for input(s): HGBA1C in the last 72 hours. CBG: No results for input(s): GLUCAP in the last 168 hours. Lipid Profile: No results for input(s): CHOL, HDL, LDLCALC, TRIG, CHOLHDL, LDLDIRECT in the last 72 hours. Thyroid Function Tests: No results for input(s): TSH, T4TOTAL, FREET4, T3FREE, THYROIDAB in the last 72 hours. Anemia Panel: No results for input(s): VITAMINB12, FOLATE, FERRITIN, TIBC, IRON, RETICCTPCT in the last 72 hours. Sepsis Labs: No results for input(s): PROCALCITON, LATICACIDVEN in the last 168 hours.  Recent Results (from the past 240 hour(s))  SARS Coronavirus 2 (CEPHEID - Performed in [facility] hospital lab), Hosp Order     Status: None   Collection Time: 09/27/18  7:49 AM   Specimen: Nasopharyngeal Swab  Result Value Ref Range Status   SARS Coronavirus 2 NEGATIVE NEGATIVE Final    Comment: (NOTE) If result is NEGATIVE SARS-CoV-2 target nucleic acids are NOT DETECTED. The SARS-CoV-2 RNA is generally detectable in upper and lower  respiratory specimens during the acute phase of infection. The lowest  concentration of SARS-CoV-2 viral copies this assay can detect is 250  copies / mL. A negative result does not preclude SARS-CoV-2 infection  and should not be used as the sole basis for treatment or other   patient management decisions.  A negative result may occur with  improper specimen collection / handling, submission of specimen other  than nasopharyngeal swab, presence of viral mutation(s) within the  areas targeted by this assay, and inadequate number of viral copies  (<250 copies / mL). A negative result must be combined with clinical  observations, patient history, and epidemiological information. If result is POSITIVE SARS-CoV-2 target nucleic acids are DETECTED. The SARS-CoV-2 RNA is generally detectable in upper and lower  respiratory specimens dur ing the acute phase of infection.  Positive  results are indicative of active infection with SARS-CoV-2.  Clinical  correlation with patient history and other diagnostic information is  necessary to determine patient infection status.  Positive results do  not rule out bacterial infection or co-infection with other viruses. If result is PRESUMPTIVE POSTIVE SARS-CoV-2 nucleic acids MAY BE PRESENT.   A presumptive positive result was obtained on the submitted specimen  and confirmed on repeat testing.  While 2019 novel coronavirus  (SARS-CoV-2) nucleic acids may be present in the submitted sample  additional confirmatory testing may be necessary for epidemiological  and / or clinical management purposes  to differentiate between  SARS-CoV-2 and other  Sarbecovirus currently known to infect humans.  If clinically indicated additional testing with an alternate test  methodology [phone]) is advised. The SARS-CoV-2 RNA is generally  detectable in upper and lower respiratory sp ecimens during the acute  phase of infection. The expected result is Negative. Fact Sheet for Patients:  BoilerBrush.com.cy Fact Sheet for Healthcare Providers: https://pope.com/ This test is not yet approved or cleared by the [place] FDA and has been authorized for detection and/or diagnosis of SARS-CoV-2  by FDA under an Emergency Use Authorization (EUA).  This EUA will remain in effect (meaning this test can be used) for the duration of the COVID-19 declaration under Section 564(b)(1) of the Act, 21 U.S.C. section 360bbb-3(b)(1), unless the authorization is terminated or revoked sooner. Performed at [facility], [address], [place], [place] [address]   SARS Coronavirus 2 (CEPHEID - Performed in [facility] hospital lab), Hosp Order     Status: None   Collection Time: 10/02/18 11:06 AM   Specimen: Nasopharyngeal Swab  Result Value Ref Range Status   SARS Coronavirus 2 NEGATIVE NEGATIVE Final    Comment: (NOTE) If result is NEGATIVE SARS-CoV-2 target nucleic acids are NOT DETECTED. The SARS-CoV-2 RNA is generally detectable in upper and lower  respiratory specimens during the acute phase of infection. The lowest  concentration of SARS-CoV-2 viral copies this assay can detect is 250  copies / mL. A negative result does not preclude SARS-CoV-2 infection  and should not be used as the sole basis for treatment or other  patient management decisions.  A negative result may occur with  improper specimen collection / handling, submission of specimen other  than nasopharyngeal swab, presence of viral mutation(s) within the  areas targeted by this assay, and inadequate number of viral copies  (<250 copies / mL). A negative result must be combined with clinical  observations, patient history, and epidemiological information. If result is POSITIVE SARS-CoV-2 target nucleic acids are DETECTED. The SARS-CoV-2 RNA is generally detectable in upper and lower  respiratory specimens dur ing the acute phase of infection.  Positive  results are indicative of active infection with SARS-CoV-2.  Clinical  correlation with patient history and other diagnostic information is  necessary to determine patient infection status.  Positive results do  not rule out bacterial infection or co-infection  with other viruses. If result is PRESUMPTIVE POSTIVE SARS-CoV-2 nucleic acids MAY BE PRESENT.   A presumptive positive result was obtained on the submitted specimen  and confirmed on repeat testing.  While 2019 novel coronavirus  (SARS-CoV-2) nucleic acids may be present in the submitted sample  additional confirmatory testing may be necessary for epidemiological  and / or clinical management purposes  to differentiate between  SARS-CoV-2 and other Sarbecovirus currently known to infect humans.  If clinically indicated additional testing with an alternate test  methodology [phone]) is advised. The SARS-CoV-2 RNA is generally  detectable in upper and lower respiratory sp ecimens during the acute  phase of infection. The expected result is Negative. Fact Sheet for Patients:  BoilerBrush.com.cy Fact Sheet for Healthcare Providers: https://pope.com/ This test is not yet approved or cleared by the [place] FDA and has been authorized for detection and/or diagnosis of SARS-CoV-2 by FDA under an Emergency Use Authorization (EUA).  This EUA will remain in effect (meaning this test can be used) for the duration of the COVID-19 declaration under Section 564(b)(1) of the Act, 21 U.S.C. section 360bbb-3(b)(1), unless the authorization is terminated or revoked sooner.  Performed at [facility], [address], [place], [place] [address]          Radiology Studies: Ct Abdomen Wo Contrast  Result Date: 10/01/2018 CLINICAL DATA:  73 year old male undergoing evaluation for gastrostomy tube placement. EXAM: CT ABDOMEN WITHOUT CONTRAST TECHNIQUE: Multidetector CT imaging of the abdomen was performed following the standard protocol without IV contrast. COMPARISON:  Prior abdominal ultrasound 12/25/2016 FINDINGS: Lower chest: Mild lower lobe bronchial wall thickening. Trace atelectasis versus infiltrate in the dependent portion of the right  lower lobe. No suspicious pulmonary nodule or mass. The visualized heart is normal in size. Unremarkable distal thoracic esophagus. Hepatobiliary: Normal hepatic contour and morphology. No discrete hepatic lesion. The gallbladder lumen is filled with high density material likely representing sludge and/or small stones. No biliary ductal dilatation. Pancreas: Unremarkable. No pancreatic ductal dilatation or surrounding inflammatory changes. Spleen: Normal in size without focal abnormality. Adrenals/Urinary Tract: Normal adrenal glands. Circumscribed low-attenuation lesions within the right kidney are incompletely characterized in the absence of intravenous contrast. However, a sonographically simple cyst was previously identified in the lower pole. No evidence of hydronephrosis or nephrolithiasis. Stomach/Bowel: High-riding transverse colon which lies anterior to the gastric body and antrum. Otherwise, normal gastric anatomy. No focal bowel wall thickening or evidence of obstruction. Vascular/Lymphatic: Limited evaluation in the absence of intravenous contrast. Extensive atherosclerotic vascular calcifications. No aneurysm. Other: No ascites or abdominal wall hernia. Musculoskeletal: No acute fracture or aggressive appearing lytic or blastic osseous lesion. IMPRESSION: 1. High-riding transverse colon which lies anterior to the gastric body and antrum. Percutaneous gastrostomy tube placement may be possible, however will require good visualization of the transverse colon at the time of attempted procedure. Consider administration of barium prior to the procedure. 2.  Aortic Atherosclerosis (ICD10-170.0). 3. Right lower lobe atelectasis versus changes of small volume aspiration. 4. Sludge and/or small stones in the gallbladder lumen. Electronically Signed   By: Malachy Moan M.D.   On: 10/01/2018 14:18   Dg Abd 1 View  Result Date: 10/02/2018 CLINICAL DATA:  Enteric tube placement. EXAM: ABDOMEN - 1 VIEW  COMPARISON:  CT abdomen from yesterday. FINDINGS: Enteric tube tip in the stomach with the proximal side port in the lower esophagus. Nonobstructive bowel gas pattern. No acute osseous abnormality. IMPRESSION: 1. Enteric tube tip in the stomach with proximal side port in the lower esophagus. Recommend advancing 7-8 cm. Electronically Signed   By: Obie Dredge M.D.   On: 10/02/2018 18:59   Dg Abd Portable 1v  Result Date: 10/03/2018 CLINICAL DATA:  Dysphagia. EXAM: PORTABLE ABDOMEN - 1 VIEW COMPARISON:  Radiograph of October 02, 2018. FINDINGS: The bowel gas pattern is normal. Residual contrast is noted throughout the colon. No radio-opaque calculi or other significant radiographic abnormality are seen. IMPRESSION: No evidence of bowel obstruction or ileus. Electronically Signed   By: Lupita Raider M.D.   On: 10/03/2018 07:52   Dg Abd Portable 1v  Result Date: 10/02/2018 CLINICAL DATA:  Nasogastric tube advancement. EXAM: PORTABLE ABDOMEN - 1 VIEW COMPARISON:  10/02/2018 at 1839 hours FINDINGS: The nasogastric tube has been advanced. Tip now extends further into the stomach with the side hole entering the proximal stomach. Normal bowel gas pattern. IMPRESSION: 1. Advanced nasogastric tube, now fully within the stomach. Electronically Signed   By: Amie Portland M.D.   On: 10/02/2018 20:45        Scheduled Meds:  chlorhexidine  15 mL Mouth Rinse BID   lidocaine  1 application Urethral  Once   mouth rinse  15 mL Mouth Rinse q12n4p   metoprolol tartrate  5 mg Intravenous Q6H   tamsulosin  0.4 mg Oral Once   Continuous Infusions:  dextrose 5 % and 0.45 % NaCl with KCl 20 mEq/L 100 mL/hr at 10/03/18 0520     LOS: 6 days   Time spent= 25 mins    Tocarra Gassen Joline Maxcy, MD [facility]  If 7PM-7AM, please contact night-coverage [email or web address] 10/03/2018, 11:25 AM

## 2018-10-03 NOTE — Progress Notes (Signed)
   Was tentatively scheduled for PAC and percutaneous gastric tube today in IR  Afib/AVR early this am Adenosine given Back to NSR per MD note  Will reschedule to 7/10 for IR procedures per Dr Anselm Pancoast

## 2018-10-03 NOTE — Care Management Important Message (Signed)
Important Message  Patient Details  Name: Logan Price MRN: [id number] Date of Birth: February 21, 1946   Medicare Important Message Given:  Yes     Waris, Rodger 10/03/2018, 12:47 PM

## 2018-10-03 NOTE — Progress Notes (Signed)
  Echocardiogram 2D Echocardiogram has been performed.  Tayla Panozzo G Jamila Slatten 10/03/2018, 11:15 AM

## 2018-10-03 NOTE — Progress Notes (Signed)
Physical Therapy Treatment Patient Details Name: Logan Price MRN: [id number] DOB: May 20, 1945 Today's Date: 10/03/2018    History of Present Illness Pt is a 73 yo male admitted for planned trach and laryngeal biopsy 7/2. 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 Esophagram 6/26 showed silent gross aspiration of barium. PMH includes: HLD, HTN, hepatitis C, GERD, diverticulitis    PT Comments    Pt appears eager to push himself.  A little impulsive.  Emphasis on gait stability/stamina and gait quality.    Follow Up Recommendations  Home health PT     Equipment Recommendations  Other (comment)(TBA)    Recommendations for Other Services       Precautions / Restrictions Precautions Precautions: Fall    Mobility  Bed Mobility               General bed mobility comments: OOB in the recliner on arrival  Transfers Overall transfer level: Needs assistance Equipment used: None Transfers: Sit to/from Stand Sit to Stand: Supervision            Ambulation/Gait Ambulation/Gait assistance: Min guard;Min assist Gait Distance (Feet): 400 Feet Assistive device: Rolling walker (2 wheeled) Gait Pattern/deviations: Step-through pattern Gait velocity: moderate to too fast. Gait velocity interpretation: 1.31 - 2.62 ft/sec, indicative of limited community ambulator General Gait Details: flexed knees, low amplitude steps, especially l LE.  When pt speeds up too fast he has more tendency to shuffle and get out of control.   Stairs             Wheelchair Mobility    Modified Rankin (Stroke Patients Only)       Balance Overall balance assessment: Needs assistance   Sitting balance-Leahy Scale: Good       Standing balance-Leahy Scale: Fair                              Cognition Arousal/Alertness: Awake/alert Behavior During Therapy: WFL for tasks assessed/performed Overall  Cognitive Status: Within Functional Limits for tasks assessed                                        Exercises      General Comments General comments (skin integrity, edema, etc.): sats on 28% TC during gait.  Sats maintained at 93% with EHR in the 110's      Pertinent Vitals/Pain Pain Assessment: No/denies pain    Home Living                      Prior Function            PT Goals (current goals can now be found in the care plan section) Acute Rehab PT Goals Patient Stated Goal: back independence PT Goal Formulation: With patient Time For Goal Achievement: 10/15/18 Potential to Achieve Goals: Good Progress towards PT goals: Progressing toward goals    Frequency    Min 3X/week      PT Plan Current plan remains appropriate    Co-evaluation              AM-PAC PT "6 Clicks" Mobility   Outcome Measure  Help needed turning from your back to your side while in a flat bed without using bedrails?: None Help needed moving from lying on your back to sitting  on the side of a flat bed without using bedrails?: None Help needed moving to and from a bed to a chair (including a wheelchair)?: A Little Help needed standing up from a chair using your arms (e.g., wheelchair or bedside chair)?: A Little Help needed to walk in hospital room?: A Little Help needed climbing 3-5 steps with a railing? : A Little 6 Click Score: 20    End of Session   Activity Tolerance: Patient tolerated treatment well Patient left: Other (comment);with call bell/phone within reach Nurse Communication: Mobility status PT Visit Diagnosis: Unsteadiness on feet (R26.81);Difficulty in walking, not elsewhere classified (R26.2)     Time: 7829-5621 PT Time Calculation (min) (ACUTE ONLY): 25 min  Charges:  $Gait Training: 8-22 mins $Therapeutic Activity: 8-22 mins                     10/03/2018  [place] Bing, PT Acute Rehabilitation Services [phone]   (pager) [phone]  (office)   Eliseo Gum Calixto Pavel 10/03/2018, 3:21 PM

## 2018-10-03 NOTE — Plan of Care (Signed)
Continue to monitor

## 2018-10-03 NOTE — Plan of Care (Signed)
  Problem: Education: Goal: Knowledge of General Education information will improve Description Including pain rating scale, medication(s)/side effects and non-pharmacologic comfort measures Outcome: Progressing   

## 2018-10-03 NOTE — Progress Notes (Signed)
Pt hr 150'-160's 12 lead EKG done showed Afib with RVR, other wise VSS paged Dr. Benjamine Mola paged and returned call he is going to consult TR[facility] will continue to monitor.

## 2018-10-03 NOTE — Progress Notes (Signed)
Subjective: Pt went into afib last night. Converted back to NSR by hospitalist.  Objective: Vital signs in last 24 hours: Temp:  [97.8 F (36.6 C)-98.3 F (36.8 C)] 97.8 F (36.6 C) (07/09 0340) Pulse Rate:  [75-159] 89 (07/09 0530) Resp:  [15-27] 18 (07/09 0530) BP: (111-148)/(72-90) 117/72 (07/09 0530) SpO2:  [88 %-99 %] 95 % (07/09 0530) FiO2 (%):  [28 %] 28 % (07/09 0402)  General appearance:alert, cooperative and no distress Head:Normocephalic, without obvious abnormality, atraumatic Eyes:conjunctivae/corneas clear. PERRL, EOM's intact.  Ears:normal TM's and external ear canals both ears Nose:Nares normal. Septum midline. Mucosa normal. No drainage or sinus tenderness. Throat:lips, mucosa, and tongue normal; teeth and gums normal Neck:Trach midline. No bleeding. Neurologic:Grossly normal  Recent Labs    10/03/18 0400  WBC 10.7*  HGB 12.5*  HCT 37.6*  PLT 334   Recent Labs    10/03/18 0400  NA 135  K 4.0  CL 101  CO2 25  GLUCOSE 127*  BUN <5*  CREATININE 0.87  CALCIUM 8.9    Medications:  I have reviewed the patient's current medications. Scheduled: . chlorhexidine  15 mL Mouth Rinse BID  . lidocaine  1 application Urethral Once  . mouth rinse  15 mL Mouth Rinse q12n4p  . metoprolol tartrate  5 mg Intravenous Q6H  . tamsulosin  0.4 mg Oral Once   Continuous: .  ceFAZolin (ANCEF) IV    . 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 - Appreciate hospitalist's help in medical management. -Neck CT shows extensive laryngeal/pharyngeal/esophagealSCCA. -Trach changed to anuncuffed#[id number]. - Oncology consulted. - Plan G-tube and port placement per IR.  - Will start flomax once the G-tube is in place.    LOS: 6 days   Logan Price W Logan Price 10/03/2018, 7:25 AM

## 2018-10-04 ENCOUNTER — Inpatient Hospital Stay (HOSPITAL_COMMUNITY): Payer: Medicare Other

## 2018-10-04 ENCOUNTER — Encounter (HOSPITAL_COMMUNITY): Payer: Self-pay | Admitting: Interventional Radiology

## 2018-10-04 DIAGNOSIS — R1312 Dysphagia, oropharyngeal phase: Secondary | ICD-10-CM

## 2018-10-04 DIAGNOSIS — C320 Malignant neoplasm of glottis: Secondary | ICD-10-CM

## 2018-10-04 HISTORY — PX: IR GASTROSTOMY TUBE MOD SED: IMG625

## 2018-10-04 HISTORY — PX: IR IMAGING GUIDED PORT INSERTION: IMG5740

## 2018-10-04 LAB — GLUCOSE, CAPILLARY: Glucose-Capillary: 94 mg/dL (ref 70–99)

## 2018-10-04 LAB — MAGNESIUM: Magnesium: 1.8 mg/dL (ref 1.7–2.4)

## 2018-10-04 LAB — BASIC METABOLIC PANEL
Anion gap: 9 (ref 5–15)
BUN: <5 mg/dL — ABNORMAL LOW (ref 8–23)
CO2: 23 mmol/L (ref 22–32)
Calcium: 8.4 mg/dL — ABNORMAL LOW (ref 8.9–10.3)
Chloride: 104 mmol/L (ref 98–111)
Creatinine, Ser: 0.73 mg/dL (ref 0.61–1.24)
GFR calc Af Amer: >60 mL/min (ref >60)
GFR calc non Af Amer: >60 mL/min (ref >60)
Glucose, Bld: 118 mg/dL — ABNORMAL HIGH (ref 70–99)
Potassium: 3.8 mmol/L (ref 3.5–5.1)
Sodium: 136 mmol/L (ref 135–145)

## 2018-10-04 MED ORDER — POTASSIUM CHLORIDE 10 MEQ/100ML IV SOLN
10.0000 meq | INTRAVENOUS | Status: AC
  Administered 2018-10-04 (×3): 10 meq via INTRAVENOUS
  Filled 2018-10-04 (×3): qty 100

## 2018-10-04 MED ORDER — HEPARIN SOD (PORK) LOCK FLUSH 100 UNIT/ML IV SOLN
INTRAVENOUS | Status: AC
  Filled 2018-10-04: qty 5

## 2018-10-04 MED ORDER — CEFAZOLIN SODIUM-DEXTROSE 2-4 GM/100ML-% IV SOLN
INTRAVENOUS | Status: AC
  Administered 2018-10-04: 2000 mg
  Filled 2018-10-04: qty 100

## 2018-10-04 MED ORDER — MIDAZOLAM HCL 2 MG/2ML IJ SOLN
INTRAMUSCULAR | Status: AC | PRN
  Administered 2018-10-04: 0.5 mg via INTRAVENOUS
  Administered 2018-10-04: 1 mg via INTRAVENOUS

## 2018-10-04 MED ORDER — LIDOCAINE HCL (PF) 1 % IJ SOLN
INTRAMUSCULAR | Status: AC
  Filled 2018-10-04: qty 30

## 2018-10-04 MED ORDER — MIDAZOLAM HCL 2 MG/2ML IJ SOLN
INTRAMUSCULAR | Status: AC
  Administered 2018-10-04: 15:00:00
  Filled 2018-10-04: qty 2

## 2018-10-04 MED ORDER — CEFAZOLIN (ANCEF) 1 G IV SOLR: 1.0000 g | INTRAVENOUS | Status: DC

## 2018-10-04 MED ORDER — HEPARIN SOD (PORK) LOCK FLUSH 100 UNIT/ML IV SOLN
INTRAVENOUS | Status: AC | PRN
  Administered 2018-10-04: 500 [IU] via INTRAVENOUS

## 2018-10-04 MED ORDER — LIDOCAINE HCL (PF) 1 % IJ SOLN: INTRAMUSCULAR | Status: AC | PRN

## 2018-10-04 MED ORDER — MAGNESIUM SULFATE IN D5W 1-5 GM/100ML-% IV SOLN
1.0000 g | Freq: Once | INTRAVENOUS | Status: AC
  Administered 2018-10-04: 1 g via INTRAVENOUS
  Filled 2018-10-04: qty 100

## 2018-10-04 MED ORDER — GLUCAGON HCL RDNA (DIAGNOSTIC) 1 MG IJ SOLR
INTRAMUSCULAR | Status: AC
  Filled 2018-10-04: qty 1

## 2018-10-04 MED ORDER — FENTANYL CITRATE (PF) 100 MCG/2ML IJ SOLN
INTRAMUSCULAR | Status: AC
  Filled 2018-10-04: qty 2

## 2018-10-04 MED ORDER — FENTANYL CITRATE (PF) 100 MCG/2ML IJ SOLN
INTRAMUSCULAR | Status: AC | PRN
  Administered 2018-10-04 (×2): 25 ug via INTRAVENOUS

## 2018-10-04 NOTE — Procedures (Signed)
Interventional Radiology Procedure Note  Procedure: Image guided port placement. Right IJ port  Complications: None  Recommendations:  - Ok to shower tomorrow - Do not submerge for 7 days - Routine line care   Signed,  Dulcy Fanny. Earleen Newport, DO

## 2018-10-04 NOTE — Progress Notes (Signed)
Subjective: No new issues overnight. Awaiting PEG tube and port placement today.  Objective: Vital signs in last 24 hours: Temp:  [98.3 F (36.8 C)-99 F (37.2 C)] 98.3 F (36.8 C) (07/10 0355) Pulse Rate:  [64-92] 64 (07/10 0819) Resp:  [12-25] 23 (07/10 0819) BP: (120-153)/(69-81) 132/69 (07/10 0355) SpO2:  [93 %-100 %] 100 % (07/10 0819) FiO2 (%):  [28 %] 28 % (07/10 0819)  General appearance:alert, cooperative and no distress Head:Normocephalic, without obvious abnormality, atraumatic Eyes:conjunctivae/corneas clear. PERRL, EOM's intact.  Ears:normal TM's and external ear canals both ears Nose:Nares normal. Septum midline. Mucosa normal. No drainage or sinus tenderness. Throat:lips, mucosa, and tongue normal; teeth and gums normal Neck:Trach midline. No bleeding. Neurologic:Grossly normal  Recent Labs    10/03/18 0400  WBC 10.7*  HGB 12.5*  HCT 37.6*  PLT 334   Recent Labs    10/03/18 0400 10/04/18 0250  NA 135 136  K 4.0 3.8  CL 101 104  CO2 25 23  GLUCOSE 127* 118*  BUN <5* <5*  CREATININE 0.87 0.73  CALCIUM 8.9 8.4*    Medications:  I have reviewed the patient's current medications. Scheduled: . chlorhexidine  15 mL Mouth Rinse BID  . lidocaine  1 application Urethral Once  . mouth rinse  15 mL Mouth Rinse q12n4p  . metoprolol tartrate  5 mg Intravenous Q6H  . tamsulosin  0.4 mg Oral Once   Continuous: . dextrose 5 % and 0.45 % NaCl with KCl 20 mEq/L 100 mL/hr at 10/04/18 0600  . 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 - Appreciate hospitalist's help in medical management. -Neck CT shows extensive laryngeal/pharyngeal/esophagealSCCA. -Trach changed to anuncuffed#[id number]. - Oncology consulted. - Plan G-tubeand portplacement per IR. - Will start flomax and low-dose lopressor once the G-tube is in place.    LOS: 7 days   Debanhi Blaker W  Rica Heather 10/04/2018, 11:14 AM

## 2018-10-04 NOTE — Progress Notes (Signed)
Occupational Therapy Treatment Patient Details Name: Logan Price MRN: [id number] DOB: 04-02-1945 Today's Date: 10/04/2018    History of present illness Pt is a 73 yo male admitted for planned trach and laryngeal biopsy 7/2. 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 Esophagram 6/26 showed silent gross aspiration of barium. PMH includes: HLD, HTN, hepatitis C, GERD, diverticulitis   OT comments  Pt progressing towards acute OT goals. Focus of session was activity tolerance during ADLs and initiation of general UB strengthening program, issued level 2 theraband. Pt with HR 138, O2 83 on 5L supplemental O@ with activity. Breathing exercises encouraged, recovered by end of session. D/c plan remains appropriate.    Follow Up Recommendations  No OT follow up    Equipment Recommendations  None recommended by OT    Recommendations for Other Services      Precautions / Restrictions Precautions Precautions: Fall Precaution Comments: increased trach secretions Restrictions Weight Bearing Restrictions: No       Mobility Bed Mobility Overal bed mobility: Modified Independent                Transfers Overall transfer level: Needs assistance Equipment used: None Transfers: Sit to/from Stand Sit to Stand: Supervision              Balance Overall balance assessment: Needs assistance   Sitting balance-Leahy Scale: Good     Standing balance support: Single extremity supported;Bilateral upper extremity supported Standing balance-Leahy Scale: Fair                             ADL either performed or assessed with clinical judgement   ADL Overall ADL's : Needs assistance/impaired                                     Functional mobility during ADLs: Min guard;Rolling walker General ADL Comments: Pt completed bed mobility, walked in place in standing then transfered to recliner.  UB HEP with level 2 theraband initiated with pt providing return demo.      Vision       Perception     Praxis      Cognition Arousal/Alertness: Awake/alert Behavior During Therapy: WFL for tasks assessed/performed Overall Cognitive Status: Within Functional Limits for tasks assessed                                          Exercises     Shoulder Instructions       General Comments      Pertinent Vitals/ Pain       Pain Assessment: No/denies pain  Home Living                                          Prior Functioning/Environment              Frequency  Min 2X/week        Progress Toward Goals  OT Goals(current goals can now be found in the care plan section)  Progress towards OT goals: Progressing toward goals  Acute Rehab OT Goals Patient Stated Goal: back independence OT Goal Formulation: With patient  Time For Goal Achievement: 10/16/18 Potential to Achieve Goals: Good ADL Goals Pt Will Perform Grooming: Independently;standing Pt Will Perform Lower Body Bathing: Independently Pt Will Perform Lower Body Dressing: Independently;sit to/from stand Pt Will Transfer to Toilet: Independently;ambulating Pt Will Perform Toileting - Clothing Manipulation and hygiene: Independently;sit to/from stand  Plan Discharge plan remains appropriate    Co-evaluation                 AM-PAC OT "6 Clicks" Daily Activity     Outcome Measure   Help from another person eating meals?: Total Help from another person taking care of personal grooming?: None Help from another person toileting, which includes using toliet, bedpan, or urinal?: A Little Help from another person bathing (including washing, rinsing, drying)?: A Little Help from another person to put on and taking off regular upper body clothing?: None Help from another person to put on and taking off regular lower body clothing?: A Little 6 Click Score: 18    End of  Session Equipment Utilized During Treatment: Oxygen;Rolling walker  OT Visit Diagnosis: Unsteadiness on feet (R26.81);Muscle weakness (generalized) (M62.81)   Activity Tolerance Patient tolerated treatment well   Patient Left in chair;with call bell/phone within reach   Nurse Communication          Time: 0950-1010 OT Time Calculation (min): 20 min  Charges: OT General Charges $OT Visit: 1 Visit OT Treatments $Self Care/Home Management : 8-22 mins  Raynald Kemp, OT Acute Rehabilitation Services Pager: [phone] Office: [phone]    Pilar Grammes 10/04/2018, 11:15 AM

## 2018-10-04 NOTE — Sedation Documentation (Signed)
Respiratory at bedside assisting with trach colar stabilization.

## 2018-10-04 NOTE — Progress Notes (Signed)
PROGRESS NOTE    Logan Price  OZH:[id number] DOB: May 25, 1945 DOA: 09/27/2018 PCP: [name], FNP   Brief Narrative:  73 year old with history of GERD, essential hypertension, hyperlipidemia who recently diagnosed neck mass concerning for malignancy was admitted to the hospital.  Overnight patient went into SVT therefore medical team was consulted.  Patient was given adenosine x1 which converted back to normal sinus rhythm.   Assessment & Plan:   Principal Problem:   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HCC) Active Problems:   Laryngeal mass   Goals of care, counseling/discussion   Dysphagia   Loss of weight   PSVT (paroxysmal supraventricular tachycardia) (HCC)   Atrial fibrillation (HCC)   Paroxysmal supraventricular tachycardia; seems better.  -Unknown exact etiology.  So narrow complex tachycardia with regular rate.  Received 1 dose of adenosine, now in normal sinus rhythm with heart rate in 90s. -TSH- Normal. Echo normal.  -Replete Lytes. Keep K >4.0, Mg >2 -On IV Lopressor PRN, will start low-dose p.o. Lopressor once PEG tube has been placed via PEG tube.  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  Plans to place PEG tube by ENT team. -Nutrition team is following.  We will continue to follow along patient during the hospitalization.  Please reach out if any further questions in the meantime.  I have also spoken with the nursing staff and she is aware of the plan.    Subjective: Denies complaints.  One brief episode of asymptomatic SVT on tele overnight.   Review of Systems Otherwise negative except as per HPI, including: General = no fevers, chills, dizziness, malaise, fatigue HEENT/EYES = negative for pain, redness, loss of vision, double vision, blurred vision, loss of hearing, sore throat, hoarseness, dysphagia Cardiovascular= negative for chest pain, palpitation, murmurs, lower extremity swelling  Respiratory/lungs= negative for shortness of breath, cough, hemoptysis, wheezing, mucus production Gastrointestinal= negative for nausea, vomiting,, abdominal pain, melena, hematemesis Genitourinary= negative for Dysuria, Hematuria, Change in Urinary Frequency MSK = Negative for arthralgia, myalgias, Back Pain, Joint swelling  Neurology= Negative for headache, seizures, numbness, tingling  Psychiatry= Negative for anxiety, depression, suicidal and homocidal ideation Allergy/Immunology= Medication/Food allergy as listed  Skin= Negative for Rash, lesions, ulcers, itching   Objective: Vitals:   10/03/18 2307 10/04/18 0312 10/04/18 0355 10/04/18 0819  BP:   132/69   Pulse: 74 74 78 64  Resp: (!) 24  12 (!) 23  Temp:   98.3 F (36.8 C)   TempSrc:   Oral   SpO2: 97% 94% 96% 100%  Weight:      Height:        Intake/Output Summary (Last 24 hours) at 10/04/2018 1022 Last data filed at 10/04/2018 0600 Gross per 24 hour  Intake 2262.7 ml  Output 1100 ml  Net 1162.7 ml   Filed Weights   09/27/18 0848 09/28/18 0600  Weight: 63 kg 60.5 kg    Examination:  Constitutional: NAD, calm, comfortable; temporal wasting.  Eyes: PERRL, lids and conjunctivae normal ENMT: Mucous membranes are moist. Posterior pharynx clear of any exudate or lesions.Normal dentition. + Trach in place.  Neck: normal, supple, no masses, no thyromegaly Respiratory: clear to auscultation bilaterally, no wheezing, no crackles. Normal respiratory effort. No accessory muscle use.  Cardiovascular: Regular rate and rhythm, no murmurs / rubs / gallops. No extremity edema. 2+ pedal pulses. No carotid bruits.  Abdomen: no tenderness, no masses palpated. No hepatosplenomegaly. Bowel sounds positive.  Musculoskeletal: no clubbing / cyanosis. No  joint deformity upper and lower extremities. Good ROM, no contractures. Normal muscle tone.  Skin: no rashes, lesions, ulcers. No induration Neurologic: CN 2-12 grossly intact. Sensation  intact, DTR normal. Strength 5/5 in all 4.  Psychiatric: Normal judgment and insight. Alert and oriented x 3. Normal mood.   Unable to speak due to tracheostomy but clearly able to communicate by lip reading.  Mentating well overall otherwise.   Data Reviewed:   CBC: Recent Labs  Lab 10/03/18 0400  WBC 10.7*  HGB 12.5*  HCT 37.6*  MCV 87.4  PLT 334   Basic Metabolic Panel: Recent Labs  Lab 10/03/18 0400 10/04/18 0250  NA 135 136  K 4.0 3.8  CL 101 104  CO2 25 23  GLUCOSE 127* 118*  BUN <5* <5*  CREATININE 0.87 0.73  CALCIUM 8.9 8.4*  MG 1.7 1.8  PHOS 3.9  --    GFR: Estimated Creatinine Clearance: 72.5 mL/min (by C-G formula based on SCr of 0.73 mg/dL). Liver Function Tests: No results for input(s): AST, ALT, ALKPHOS, BILITOT, PROT, ALBUMIN in the last 168 hours. No results for input(s): LIPASE, AMYLASE in the last 168 hours. No results for input(s): AMMONIA in the last 168 hours. Coagulation Profile: Recent Labs  Lab 10/03/18 0400  INR 1.2   Cardiac Enzymes: No results for input(s): CKTOTAL, CKMB, CKMBINDEX, TROPONINI in the last 168 hours. BNP (last 3 results) No results for input(s): PROBNP in the last 8760 hours. HbA1C: No results for input(s): HGBA1C in the last 72 hours. CBG: No results for input(s): GLUCAP in the last 168 hours. Lipid Profile: No results for input(s): CHOL, HDL, LDLCALC, TRIG, CHOLHDL, LDLDIRECT in the last 72 hours. Thyroid Function Tests: Recent Labs    10/03/18 1029  TSH 2.426   Anemia Panel: No results for input(s): VITAMINB12, FOLATE, FERRITIN, TIBC, IRON, RETICCTPCT in the last 72 hours. Sepsis Labs: No results for input(s): PROCALCITON, LATICACIDVEN in the last 168 hours.  Recent Results (from the past 240 hour(s))  SARS Coronavirus 2 (CEPHEID - Performed in [facility] hospital lab), Hosp Order     Status: None   Collection Time: 09/27/18  7:49 AM   Specimen: Nasopharyngeal Swab  Result Value Ref Range Status    SARS Coronavirus 2 NEGATIVE NEGATIVE Final    Comment: (NOTE) If result is NEGATIVE SARS-CoV-2 target nucleic acids are NOT DETECTED. The SARS-CoV-2 RNA is generally detectable in upper and lower  respiratory specimens during the acute phase of infection. The lowest  concentration of SARS-CoV-2 viral copies this assay can detect is 250  copies / mL. A negative result does not preclude SARS-CoV-2 infection  and should not be used as the sole basis for treatment or other  patient management decisions.  A negative result may occur with  improper specimen collection / handling, submission of specimen other  than nasopharyngeal swab, presence of viral mutation(s) within the  areas targeted by this assay, and inadequate number of viral copies  (<250 copies / mL). A negative result must be combined with clinical  observations, patient history, and epidemiological information. If result is POSITIVE SARS-CoV-2 target nucleic acids are DETECTED. The SARS-CoV-2 RNA is generally detectable in upper and lower  respiratory specimens dur ing the acute phase of infection.  Positive  results are indicative of active infection with SARS-CoV-2.  Clinical  correlation with patient history and other diagnostic information is  necessary to determine patient infection status.  Positive results do  not rule out bacterial infection or  co-infection with other viruses. If result is PRESUMPTIVE POSTIVE SARS-CoV-2 nucleic acids MAY BE PRESENT.   A presumptive positive result was obtained on the submitted specimen  and confirmed on repeat testing.  While 2019 novel coronavirus  (SARS-CoV-2) nucleic acids may be present in the submitted sample  additional confirmatory testing may be necessary for epidemiological  and / or clinical management purposes  to differentiate between  SARS-CoV-2 and other Sarbecovirus currently known to infect humans.  If clinically indicated additional testing with an alternate test   methodology [phone]) is advised. The SARS-CoV-2 RNA is generally  detectable in upper and lower respiratory sp ecimens during the acute  phase of infection. The expected result is Negative. Fact Sheet for Patients:  BoilerBrush.com.cy Fact Sheet for Healthcare Providers: https://pope.com/ This test is not yet approved or cleared by the [place] FDA and has been authorized for detection and/or diagnosis of SARS-CoV-2 by FDA under an Emergency Use Authorization (EUA).  This EUA will remain in effect (meaning this test can be used) for the duration of the COVID-19 declaration under Section 564(b)(1) of the Act, 21 U.S.C. section 360bbb-3(b)(1), unless the authorization is terminated or revoked sooner. Performed at [facility], [address], [place], [place] [address]   SARS Coronavirus 2 (CEPHEID - Performed in [facility] hospital lab), Hosp Order     Status: None   Collection Time: 10/02/18 11:06 AM   Specimen: Nasopharyngeal Swab  Result Value Ref Range Status   SARS Coronavirus 2 NEGATIVE NEGATIVE Final    Comment: (NOTE) If result is NEGATIVE SARS-CoV-2 target nucleic acids are NOT DETECTED. The SARS-CoV-2 RNA is generally detectable in upper and lower  respiratory specimens during the acute phase of infection. The lowest  concentration of SARS-CoV-2 viral copies this assay can detect is 250  copies / mL. A negative result does not preclude SARS-CoV-2 infection  and should not be used as the sole basis for treatment or other  patient management decisions.  A negative result may occur with  improper specimen collection / handling, submission of specimen other  than nasopharyngeal swab, presence of viral mutation(s) within the  areas targeted by this assay, and inadequate number of viral copies  (<250 copies / mL). A negative result must be combined with clinical  observations, patient history, and epidemiological  information. If result is POSITIVE SARS-CoV-2 target nucleic acids are DETECTED. The SARS-CoV-2 RNA is generally detectable in upper and lower  respiratory specimens dur ing the acute phase of infection.  Positive  results are indicative of active infection with SARS-CoV-2.  Clinical  correlation with patient history and other diagnostic information is  necessary to determine patient infection status.  Positive results do  not rule out bacterial infection or co-infection with other viruses. If result is PRESUMPTIVE POSTIVE SARS-CoV-2 nucleic acids MAY BE PRESENT.   A presumptive positive result was obtained on the submitted specimen  and confirmed on repeat testing.  While 2019 novel coronavirus  (SARS-CoV-2) nucleic acids may be present in the submitted sample  additional confirmatory testing may be necessary for epidemiological  and / or clinical management purposes  to differentiate between  SARS-CoV-2 and other Sarbecovirus currently known to infect humans.  If clinically indicated additional testing with an alternate test  methodology [phone]) is advised. The SARS-CoV-2 RNA is generally  detectable in upper and lower respiratory sp ecimens during the acute  phase of infection. The expected result is Negative. Fact Sheet for Patients:  BoilerBrush.com.cy Fact Sheet  for Healthcare Providers: https://pope.com/ This test is not yet approved or cleared by the [place] and has been authorized for detection and/or diagnosis of SARS-CoV-2 by FDA under an Emergency Use Authorization (EUA).  This EUA will remain in effect (meaning this test can be used) for the duration of the COVID-19 declaration under Section 564(b)(1) of the Act, 21 U.S.C. section 360bbb-3(b)(1), unless the authorization is terminated or revoked sooner. Performed at [facility], [address] [place], [place] [address]          Radiology  Studies: Dg Abd 1 View  Result Date: 10/02/2018 CLINICAL DATA:  Enteric tube placement. EXAM: ABDOMEN - 1 VIEW COMPARISON:  CT abdomen from yesterday. FINDINGS: Enteric tube tip in the stomach with the proximal side port in the lower esophagus. Nonobstructive bowel gas pattern. No acute osseous abnormality. IMPRESSION: 1. Enteric tube tip in the stomach with proximal side port in the lower esophagus. Recommend advancing 7-8 cm. Electronically Signed   By: Obie Dredge M.D.   On: 10/02/2018 18:59   Dg Abd Portable 1v  Result Date: 10/03/2018 CLINICAL DATA:  Dysphagia. EXAM: PORTABLE ABDOMEN - 1 VIEW COMPARISON:  Radiograph of October 02, 2018. FINDINGS: The bowel gas pattern is normal. Residual contrast is noted throughout the colon. No radio-opaque calculi or other significant radiographic abnormality are seen. IMPRESSION: No evidence of bowel obstruction or ileus. Electronically Signed   By: Lupita Raider M.D.   On: 10/03/2018 07:52   Dg Abd Portable 1v  Result Date: 10/02/2018 CLINICAL DATA:  Nasogastric tube advancement. EXAM: PORTABLE ABDOMEN - 1 VIEW COMPARISON:  10/02/2018 at 1839 hours FINDINGS: The nasogastric tube has been advanced. Tip now extends further into the stomach with the side hole entering the proximal stomach. Normal bowel gas pattern. IMPRESSION: 1. Advanced nasogastric tube, now fully within the stomach. Electronically Signed   By: Amie [place] M.D.   On: 10/02/2018 20:45        Scheduled Meds: . chlorhexidine  15 mL Mouth Rinse BID  . lidocaine  1 application Urethral Once  . mouth rinse  15 mL Mouth Rinse q12n4p  . metoprolol tartrate  5 mg Intravenous Q6H  . tamsulosin  0.4 mg Oral Once   Continuous Infusions: . dextrose 5 % and 0.45 % NaCl with KCl 20 mEq/L 100 mL/hr at 10/04/18 0600  . magnesium sulfate bolus IVPB    . potassium chloride 10 mEq (10/04/18 0839)     LOS: 7 days   Time spent= 25 mins    Ankit Joline Maxcy, MD [facility]  If  7PM-7AM, please contact night-coverage [email or web address] 10/04/2018, 10:22 AM

## 2018-10-04 NOTE — Procedures (Signed)
Interventional Radiology Procedure Note  Procedure: Attempt at image guided Gtube.   The colon is located anterior to stomach, with no caudal migration with insufflation.  No target to continue . Complications: None  Recommendations: The anatomy is not conducive for an image guided g-tube. He may be candidate for surgical gtube.   Signed,   Dulcy Fanny. Earleen [place], DO

## 2018-10-04 NOTE — Sedation Documentation (Signed)
Patient is resting comfortably. 

## 2018-10-05 ENCOUNTER — Inpatient Hospital Stay (HOSPITAL_COMMUNITY): Payer: Medicare Other

## 2018-10-05 DIAGNOSIS — R1312 Dysphagia, oropharyngeal phase: Secondary | ICD-10-CM

## 2018-10-05 DIAGNOSIS — C320 Malignant neoplasm of glottis: Secondary | ICD-10-CM

## 2018-10-05 LAB — BASIC METABOLIC PANEL
Anion gap: 9 (ref 5–15)
BUN: <5 mg/dL — ABNORMAL LOW (ref 8–23)
CO2: 23 mmol/L (ref 22–32)
Calcium: 8.4 mg/dL — ABNORMAL LOW (ref 8.9–10.3)
Chloride: 103 mmol/L (ref 98–111)
Creatinine, Ser: 0.87 mg/dL (ref 0.61–1.24)
GFR calc Af Amer: >60 mL/min (ref >60)
GFR calc non Af Amer: >60 mL/min (ref >60)
Glucose, Bld: 117 mg/dL — ABNORMAL HIGH (ref 70–99)
Potassium: 4.5 mmol/L (ref 3.5–5.1)
Sodium: 135 mmol/L (ref 135–145)

## 2018-10-05 LAB — MAGNESIUM
Magnesium: 1.8 mg/dL (ref 1.7–2.4)
Magnesium: 2 mg/dL (ref 1.7–2.4)

## 2018-10-05 LAB — PHOSPHORUS
Phosphorus: 3.5 mg/dL (ref 2.5–4.6)
Phosphorus: 3.1 mg/dL (ref 2.5–4.6)

## 2018-10-05 LAB — GLUCOSE, CAPILLARY
Glucose-Capillary: 123 mg/dL — ABNORMAL HIGH (ref 70–99)
Glucose-Capillary: 126 mg/dL — ABNORMAL HIGH (ref 70–99)
Glucose-Capillary: 132 mg/dL — ABNORMAL HIGH (ref 70–99)

## 2018-10-05 MED ORDER — MAGNESIUM SULFATE 2 GM/50ML IV SOLN
2.0000 g | Freq: Once | INTRAVENOUS | Status: AC
  Administered 2018-10-05: 08:00:00 2 g via INTRAVENOUS
  Filled 2018-10-05: qty 50

## 2018-10-05 MED ORDER — LIDOCAINE HCL URETHRAL/MUCOSAL 2 % EX GEL
1.0000 "application " | Freq: Once | CUTANEOUS | Status: AC
  Administered 2018-10-06: 1 via URETHRAL
  Filled 2018-10-05: qty 5

## 2018-10-05 MED ORDER — OSMOLITE 1.5 CAL PO LIQD
1000.0000 mL | ORAL | Status: DC
  Administered 2018-10-05 – 2018-10-06 (×2): 1000 mL
  Filled 2018-10-05 (×4): qty 1000

## 2018-10-05 MED ORDER — GLYCOPYRROLATE 0.2 MG/ML IJ SOLN
0.1000 mg | Freq: Two times a day (BID) | INTRAMUSCULAR | Status: AC
  Administered 2018-10-05 – 2018-10-07 (×5): 0.1 mg via INTRAVENOUS
  Filled 2018-10-05 (×5): qty 1

## 2018-10-05 MED ORDER — PRO-STAT SUGAR FREE PO LIQD
30.0000 mL | Freq: Two times a day (BID) | ORAL | Status: DC
  Administered 2018-10-05 – 2018-10-06 (×3): 30 mL
  Filled 2018-10-05 (×3): qty 30

## 2018-10-05 NOTE — Progress Notes (Signed)
44F NG tube placed by Dr Benjamine Mola

## 2018-10-05 NOTE — Progress Notes (Signed)
Nutrition Follow-up   RD working remotely.   DOCUMENTATION CODES:   Not applicable(Suspect malnutrition)  INTERVENTION:   Plan for NG tube placement at bedside by RN per MD Benjamine Mola today with TF initiation  Tube Feeding:  -Initiate Osmolite 1.5 @ 20 ml/hr -Increase by 10 ml Q 8 hours to goal rate of 65 ml/hr (1560 ml) -30 ml Prostat BID  At goal TF provides: 2540 kcals, 128 grams protein, 1189 ml free water. Meets 100% of needs.   Monitor magnesium, potassium, and phosphorus or at least 5 occurrences (or more if needed), MD to replete as needed, as pt is at risk for refeeding syndrome given prolonged NPO status, significant wt loss with inadequate po intake, probable malnutrition.  No weight since admission; order to weigh pt today   NUTRITION DIAGNOSIS:   Inadequate oral intake related to inability to eat as evidenced by NPO status.  Being addressed via nutrition support  GOAL:   Patient will meet greater than or equal to 90% of their needs  Progressing  MONITOR:   Skin, TF tolerance, Weight trends, Labs, I & O's  REASON FOR ASSESSMENT:   NPO/Clear Liquid Diet    ASSESSMENT:   Patient with PMH significant for diverticulitis, gastritis, HLD, HTN, laryngeal cancer, and esophogeal dysphagia. 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  7/03  tracheostomy, laryngoscopy and biopsy 7/10 IR unable to place G-tube due to anatomy; colon anterior to stomach  NPO x 8 days  Noted order for Cortak tube today. Cortrak service not available until Monday 7/13. Discussed nutrition concerns and poc with MD Teoh. Received verbal order for RN to place NG tube at bedside with initiation of TF today  Labs: reviewed; no phosphorus since admission Meds: reviewed   Diet Order:   Diet Order            Diet NPO time specified Except for: Sips with Meds  Diet effective midnight              EDUCATION NEEDS:    Not appropriate for education at this time  Skin:  Skin Assessment: Skin Integrity Issues:(no pressure injuries noted per RN skin assessment) Skin Integrity Issues:: Incisions Incisions: neck  Last BM:  7/10  Height:   Ht Readings from Last 1 Encounters:  09/27/18 5\' 9"  (1.753 m)    Weight:   Wt Readings from Last 1 Encounters:  09/28/18 60.5 kg    Ideal Body Weight:  72.7 kg  BMI:  Body mass index is 19.7 kg/m.  Estimated Nutritional Needs:   Kcal:  2350-2550 kcal  Protein:  115-130 grams  Fluid:  >/= 2.3 L/day   Kerman Passey MS, RDN, LDN, CNSC [phone] Pager  [phone] Weekend/On-Call Pager

## 2018-10-05 NOTE — Progress Notes (Signed)
Attempted NG tube insertion 68F x 2 and unsuccessful. Dr Benjamine Mola aware and is coming to place tube.

## 2018-10-05 NOTE — Progress Notes (Signed)
Patient having copious amounts of secretions and has had to be suctioned numerous times by this RN and respiratory.

## 2018-10-05 NOTE — Progress Notes (Addendum)
Subjective: Resting comfortably in bed. IR unable to place PEG tube yesterday.  Objective: Vital signs in last 24 hours: Temp:  [98.2 F (36.8 C)-99.1 F (37.3 C)] 98.2 F (36.8 C) (07/11 0606) Pulse Rate:  [68-102] 99 (07/11 0827) Resp:  [14-26] 17 (07/11 0827) BP: (112-166)/(63-87) 112/68 (07/11 0606) SpO2:  [94 %-100 %] 99 % (07/11 0827) FiO2 (%):  [28 %] 28 % (07/11 0827)  General appearance:alert, cooperative and no distress Head:Normocephalic, without obvious abnormality, atraumatic Eyes:conjunctivae/corneas clear. PERRL, EOM's intact.  Ears:normal TM's and external ear canals both ears Nose:Nares normal. Septum midline. Mucosa normal. No drainage or sinus tenderness. Throat:lips, mucosa, and tongue normal; teeth and gums normal Neck:Trach midline. No bleeding. Neurologic:Grossly normal  Recent Labs    10/03/18 0400  WBC 10.7*  HGB 12.5*  HCT 37.6*  PLT 334   Recent Labs    10/04/18 0250 10/05/18 0312  NA 136 135  K 3.8 4.5  CL 104 103  CO2 23 23  GLUCOSE 118* 117*  BUN <5* <5*  CREATININE 0.73 0.87  CALCIUM 8.4* 8.4*    Medications:  I have reviewed the patient's current medications. Scheduled: . chlorhexidine  15 mL Mouth Rinse BID  . lidocaine  1 application Urethral Once  . mouth rinse  15 mL Mouth Rinse q12n4p  . metoprolol tartrate  5 mg Intravenous Q6H  . tamsulosin  0.4 mg Oral Once   Continuous: . 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 - Appreciate hospitalist's help in medical management. -Neck CT shows extensive laryngeal/pharyngeal/esophagealSCCA. -Trach changed to anuncuffed#[id number]. - Oncology consulted. - IR unable to place G-tube yesterday. Will place an NG tube and start tube feeding. - Consulted general surgery for surgical G-tube placement. Likely surgery on Monday. - Will start flomax and  low-dose lopressor once the G-tube is in place.    LOS: 8 days   Alitza Cowman W Alec Mcphee 10/05/2018, 9:33 AM

## 2018-10-05 NOTE — Plan of Care (Signed)
  Problem: Clinical Measurements: Goal: Will remain free from infection Outcome: Progressing   Problem: Health Behavior/Discharge Planning: Goal: Ability to manage health-related needs will improve Outcome: Not Progressing   Problem: Nutrition: Goal: Adequate nutrition will be maintained Outcome: Not Progressing   Problem: Elimination: Goal: Will not experience complications related to urinary retention Outcome: Not Progressing

## 2018-10-05 NOTE — Progress Notes (Signed)
Coude cath removed w 500 mL urine. Patient tolerated well. Urine clear, yellow and non-odorous.  Per Dr Benjamine Mola, may resume coude cath if needed if unable to urinate.

## 2018-10-05 NOTE — Progress Notes (Signed)
PROGRESS NOTE    Logan Price  NGE:[id number] DOB: 09-Feb-1946 DOA: 09/27/2018 PCP: [name], FNP   Brief Narrative:  73 year old with history of GERD, essential hypertension, hyperlipidemia who recently diagnosed neck mass concerning for malignancy was admitted to the hospital.  Overnight patient went into SVT therefore medical team was consulted.  Patient was given adenosine x1 which converted back to normal sinus rhythm.   Assessment & Plan:   Principal Problem:   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HCC) Active Problems:   Laryngeal mass   Goals of care, counseling/discussion   Dysphagia   Loss of weight   PSVT (paroxysmal supraventricular tachycardia) (HCC)   Paroxysmal supraventricular tachycardia; seems better.  -Unknown exact etiology.  So narrow complex tachycardia with regular rate.  Received 1 dose of adenosine, now in normal sinus rhythm with heart rate in 90s. -TSH- Normal. Echo normal.  -Replete Lytes. Keep K >4.0, Mg >2 -On IV Lopressor PRN, EKG better this morning. 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  IR unable to place Peg, plans for Surgical intervention on Monday. Will add Robinul for  Secretions.  -Nutrition team is following.  Subjective: Feels better, but still having lots of tracheal secretions.   Review of Systems Otherwise negative except as per HPI, including: General = no fevers, chills, dizziness, malaise, fatigue HEENT/EYES = negative for pain, redness, loss of vision, double vision, blurred vision, loss of hearing, sore throat, hoarseness, dysphagia Cardiovascular= negative for chest pain, palpitation, murmurs, lower extremity swelling Respiratory/lungs= negative for shortness of breath, cough, hemoptysis, wheezing, mucus production Gastrointestinal= negative for nausea, vomiting,, abdominal pain,  melena, hematemesis Genitourinary= negative for Dysuria, Hematuria, Change in Urinary Frequency MSK = Negative for arthralgia, myalgias, Back Pain, Joint swelling  Neurology= Negative for headache, seizures, numbness, tingling  Psychiatry= Negative for anxiety, depression, suicidal and homocidal ideation Allergy/Immunology= Medication/Food allergy as listed  Skin= Negative for Rash, lesions, ulcers, itching  Objective: Vitals:   10/04/18 2315 10/05/18 0339 10/05/18 0606 10/05/18 0827  BP:   112/68   Pulse: (!) 102 93 82 99  Resp: 15 17 14 17   Temp:   98.2 F (36.8 C)   TempSrc:   Oral   SpO2:   94% 99%  Weight:      Height:        Intake/Output Summary (Last 24 hours) at 10/05/2018 1100 Last data filed at 10/05/2018 4010 Gross per 24 hour  Intake 1272.58 ml  Output 1575 ml  Net -302.42 ml   Filed Weights   09/27/18 0848 09/28/18 0600  Weight: 63 kg 60.5 kg    Examination: Constitutional: NAD, calm, comfortable, trach in place with quite a bit of secretions Eyes: PERRL, lids and conjunctivae normal ENMT: Mucous membranes are moist. Posterior pharynx clear of any exudate or lesions.Normal dentition.  Neck: normal, supple, no masses, no thyromegaly Respiratory: clear to auscultation bilaterally, no wheezing, no crackles. Normal respiratory effort. No accessory muscle use.  Cardiovascular: Regular rate and rhythm, no murmurs / rubs / gallops. No extremity edema. 2+ pedal pulses. No carotid bruits.  Abdomen: no tenderness, no masses palpated. No hepatosplenomegaly. Bowel sounds positive.  Musculoskeletal: no clubbing / cyanosis. No joint deformity upper and lower extremities. Good ROM, no contractures. Normal muscle tone.  Skin: no rashes, lesions, ulcers. No induration Neurologic: CN 2-12 grossly intact. Sensation intact, DTR normal.  Strength 5/5 in all 4.  Psychiatric: Normal judgment and insight. Alert and oriented x 3. Normal mood.   Unable to speak due to tracheostomy but  clearly able to communicate by lip reading.  Mentating well overall otherwise.   Data Reviewed:   CBC: Recent Labs  Lab 10/03/18 0400  WBC 10.7*  HGB 12.5*  HCT 37.6*  MCV 87.4  PLT 334   Basic Metabolic Panel: Recent Labs  Lab 10/03/18 0400 10/04/18 0250 10/05/18 0312  NA 135 136 135  K 4.0 3.8 4.5  CL 101 104 103  CO2 25 23 23   GLUCOSE 127* 118* 117*  BUN <5* <5* <5*  CREATININE 0.87 0.73 0.87  CALCIUM 8.9 8.4* 8.4*  MG 1.7 1.8 1.8  PHOS 3.9  --   --    GFR: Estimated Creatinine Clearance: 66.6 mL/min (by C-G formula based on SCr of 0.87 mg/dL). Liver Function Tests: No results for input(s): AST, ALT, ALKPHOS, BILITOT, PROT, ALBUMIN in the last 168 hours. No results for input(s): LIPASE, AMYLASE in the last 168 hours. No results for input(s): AMMONIA in the last 168 hours. Coagulation Profile: Recent Labs  Lab 10/03/18 0400  INR 1.2   Cardiac Enzymes: No results for input(s): CKTOTAL, CKMB, CKMBINDEX, TROPONINI in the last 168 hours. BNP (last 3 results) No results for input(s): PROBNP in the last 8760 hours. HbA1C: No results for input(s): HGBA1C in the last 72 hours. CBG: Recent Labs  Lab 10/04/18 1717  GLUCAP 94   Lipid Profile: No results for input(s): CHOL, HDL, LDLCALC, TRIG, CHOLHDL, LDLDIRECT in the last 72 hours. Thyroid Function Tests: Recent Labs    10/03/18 1029  TSH 2.426   Anemia Panel: No results for input(s): VITAMINB12, FOLATE, FERRITIN, TIBC, IRON, RETICCTPCT in the last 72 hours. Sepsis Labs: No results for input(s): PROCALCITON, LATICACIDVEN in the last 168 hours.  Recent Results (from the past 240 hour(s))  SARS Coronavirus 2 (CEPHEID - Performed in [facility] hospital lab), Hosp Order     Status: None   Collection Time: 09/27/18  7:49 AM   Specimen: Nasopharyngeal Swab  Result Value Ref Range Status   SARS Coronavirus 2 NEGATIVE NEGATIVE Final    Comment: (NOTE) If result is NEGATIVE SARS-CoV-2 target nucleic acids  are NOT DETECTED. The SARS-CoV-2 RNA is generally detectable in upper and lower  respiratory specimens during the acute phase of infection. The lowest  concentration of SARS-CoV-2 viral copies this assay can detect is 250  copies / mL. A negative result does not preclude SARS-CoV-2 infection  and should not be used as the sole basis for treatment or other  patient management decisions.  A negative result may occur with  improper specimen collection / handling, submission of specimen other  than nasopharyngeal swab, presence of viral mutation(s) within the  areas targeted by this assay, and inadequate number of viral copies  (<250 copies / mL). A negative result must be combined with clinical  observations, patient history, and epidemiological information. If result is POSITIVE SARS-CoV-2 target nucleic acids are DETECTED. The SARS-CoV-2 RNA is generally detectable in upper and lower  respiratory specimens dur ing the acute phase of infection.  Positive  results are indicative of active infection with SARS-CoV-2.  Clinical  correlation with patient history and other diagnostic information is  necessary to determine patient infection status.  Positive results do  not rule out bacterial infection or co-infection with other viruses. If result is PRESUMPTIVE POSTIVE SARS-CoV-2 nucleic acids MAY BE PRESENT.  A presumptive positive result was obtained on the submitted specimen  and confirmed on repeat testing.  While 2019 novel coronavirus  (SARS-CoV-2) nucleic acids may be present in the submitted sample  additional confirmatory testing may be necessary for epidemiological  and / or clinical management purposes  to differentiate between  SARS-CoV-2 and other Sarbecovirus currently known to infect humans.  If clinically indicated additional testing with an alternate test  methodology [phone]) is advised. The SARS-CoV-2 RNA is generally  detectable in upper and lower respiratory sp ecimens  during the acute  phase of infection. The expected result is Negative. Fact Sheet for Patients:  BoilerBrush.com.cy Fact Sheet for Healthcare Providers: https://pope.com/ This test is not yet approved or cleared by the [place] FDA and has been authorized for detection and/or diagnosis of SARS-CoV-2 by FDA under an Emergency Use Authorization (EUA).  This EUA will remain in effect (meaning this test can be used) for the duration of the COVID-19 declaration under Section 564(b)(1) of the Act, 21 U.S.C. section 360bbb-3(b)(1), unless the authorization is terminated or revoked sooner. Performed at [facility], [address], [place], [place] [address]   SARS Coronavirus 2 (CEPHEID - Performed in [facility] hospital lab), Hosp Order     Status: None   Collection Time: 10/02/18 11:06 AM   Specimen: Nasopharyngeal Swab  Result Value Ref Range Status   SARS Coronavirus 2 NEGATIVE NEGATIVE Final    Comment: (NOTE) If result is NEGATIVE SARS-CoV-2 target nucleic acids are NOT DETECTED. The SARS-CoV-2 RNA is generally detectable in upper and lower  respiratory specimens during the acute phase of infection. The lowest  concentration of SARS-CoV-2 viral copies this assay can detect is 250  copies / mL. A negative result does not preclude SARS-CoV-2 infection  and should not be used as the sole basis for treatment or other  patient management decisions.  A negative result may occur with  improper specimen collection / handling, submission of specimen other  than nasopharyngeal swab, presence of viral mutation(s) within the  areas targeted by this assay, and inadequate number of viral copies  (<250 copies / mL). A negative result must be combined with clinical  observations, patient history, and epidemiological information. If result is POSITIVE SARS-CoV-2 target nucleic acids are DETECTED. The SARS-CoV-2 RNA is generally detectable  in upper and lower  respiratory specimens dur ing the acute phase of infection.  Positive  results are indicative of active infection with SARS-CoV-2.  Clinical  correlation with patient history and other diagnostic information is  necessary to determine patient infection status.  Positive results do  not rule out bacterial infection or co-infection with other viruses. If result is PRESUMPTIVE POSTIVE SARS-CoV-2 nucleic acids MAY BE PRESENT.   A presumptive positive result was obtained on the submitted specimen  and confirmed on repeat testing.  While 2019 novel coronavirus  (SARS-CoV-2) nucleic acids may be present in the submitted sample  additional confirmatory testing may be necessary for epidemiological  and / or clinical management purposes  to differentiate between  SARS-CoV-2 and other Sarbecovirus currently known to infect humans.  If clinically indicated additional testing with an alternate test  methodology [phone]) is advised. The SARS-CoV-2 RNA is generally  detectable in upper and lower respiratory sp ecimens during the acute  phase of infection. The expected result is Negative. Fact Sheet for Patients:  BoilerBrush.com.cy Fact Sheet for Healthcare Providers: https://pope.com/ This test is not yet approved or cleared by the [place] FDA  and has been authorized for detection and/or diagnosis of SARS-CoV-2 by FDA under an Emergency Use Authorization (EUA).  This EUA will remain in effect (meaning this test can be used) for the duration of the COVID-19 declaration under Section 564(b)(1) of the Act, 21 U.S.C. section 360bbb-3(b)(1), unless the authorization is terminated or revoked sooner. Performed at [facility], [address], [place], [place] [address]          Radiology Studies: Ir Gastrostomy Tube Mod Sed  Result Date: 10/04/2018 INDICATION: 73 year old male with dysphagia EXAM: PERC PLACEMENT  GASTROSTOMY MEDICATIONS: 2 g Ancef ANESTHESIA/SEDATION: Versed 0.5 mg IV; Fentanyl 25 mcg IV Moderate Sedation Time:  0 The patient was continuously monitored during the procedure by the interventional radiology nurse under my direct supervision. CONTRAST:  None FLUOROSCOPY TIME:  Fluoroscopy Time: 0 minutes 30 seconds (4 mGy). COMPLICATIONS: None immediate. PROCEDURE: Informed written consent was obtained from the patient's family after a thorough discussion of the procedural risks, benefits and alternatives. All questions were addressed. Maximal Sterile Barrier Technique was utilized including caps, mask, sterile gowns, sterile gloves, sterile drape, hand hygiene and skin antiseptic. A timeout was performed prior to the initiation of the procedure. The epigastrium was prepped with Betadine in a sterile fashion, and a sterile drape was applied covering the operative field. A sterile gown and sterile gloves were used for the procedure. A 5-French orogastric tube is placed under fluoroscopic guidance. Scout imaging of the abdomen confirms barium within the transverse colon. The stomach was distended with gas. Multiple x-ray performed with insufflation of the stomach. The partially barium filled colon did not migrate caudally with insufflation of the stomach. X-rays were performed. We withdrew from the procedure given the anatomy. Patient tolerated the procedure well and remained hemodynamically stable throughout. No complications were encountered and no significant blood loss encountered. IMPRESSION: Attempt at percutaneous gastrostomy demonstrates insufficient anatomy for image guided placement. The transverse colon did not migrate caudally with insufflation of the stomach. Signed, Yvone Neu. Loreta Ave, DO [facility] Radiology Electronically Signed   By: Gilmer Mor D.O.   On: 10/04/2018 14:25   Ir Imaging Guided Port Insertion  Result Date: 10/04/2018 INDICATION:  73 year old male with a history squamous cell carcinoma and dysphagia EXAM: IMPLANTED PORT A CATH PLACEMENT WITH ULTRASOUND AND FLUOROSCOPIC GUIDANCE MEDICATIONS: 2 g Ancef; The antibiotic was administered within an appropriate time interval prior to skin puncture. ANESTHESIA/SEDATION: Moderate (conscious) sedation was employed during this procedure. A total of Versed 1.0 mg and Fentanyl 25 mcg was administered intravenously. Moderate Sedation Time: 19 minutes. The patient's level of consciousness and vital signs were monitored continuously by radiology nursing throughout the procedure under my direct supervision. FLUOROSCOPY TIME:  0 minutes, 6 seconds (1 mGy) COMPLICATIONS: None PROCEDURE: The procedure, risks, benefits, and alternatives were explained to the patient. Questions regarding the procedure were encouraged and answered. The patient understands and consents to the procedure. Ultrasound survey was performed with images stored and sent to PACs. The right neck and chest was prepped with chlorhexidine, and draped in the usual sterile fashion using maximum barrier technique (cap and mask, sterile gown, sterile gloves, large sterile sheet, hand hygiene and cutaneous antiseptic). Antibiotic prophylaxis was provided with 2.0g Ancef administered IV one hour prior to skin incision. Local anesthesia was attained by infiltration with 1% lidocaine without epinephrine. Ultrasound demonstrated patency of the right internal jugular vein, and this was documented with an image. Under real-time ultrasound guidance, this vein was  accessed with a 21 gauge micropuncture needle and image documentation was performed. A small dermatotomy was made at the access site with an 11 scalpel. A 0.018" wire was advanced into the SVC and used to estimate the length of the internal catheter. The access needle exchanged for a 87F micropuncture vascular sheath. The 0.018" wire was then removed and a 0.035" wire advanced into the IVC. An  appropriate location for the subcutaneous reservoir was selected below the clavicle and an incision was made through the skin and underlying soft tissues. The subcutaneous tissues were then dissected using a combination of blunt and sharp surgical technique and a pocket was formed. A single lumen power injectable portacatheter was then tunneled through the subcutaneous tissues from the pocket to the dermatotomy and the port reservoir placed within the subcutaneous pocket. The venous access site was then serially dilated and a peel away vascular sheath placed over the wire. The wire was removed and the port catheter advanced into position under fluoroscopic guidance. The catheter tip is positioned in the cavoatrial junction. This was documented with a spot image. The portacatheter was then tested and found to flush and aspirate well. The port was flushed with saline followed by 100 units/mL heparinized saline. The pocket was then closed in two layers using first subdermal inverted interrupted absorbable sutures followed by a running subcuticular suture. The epidermis was then sealed with Dermabond. The dermatotomy at the venous access site was also seal with Dermabond. Patient tolerated the procedure well and remained hemodynamically stable throughout. No complications encountered and no significant blood loss encountered IMPRESSION: Status post port catheter placement.  Catheter ready for use. Signed, Yvone Neu. Reyne Dumas, RPVI [facility] Radiology Electronically Signed   By: Gilmer Mor D.O.   On: 10/04/2018 14:21        Scheduled Meds:  chlorhexidine  15 mL Mouth Rinse BID   glycopyrrolate  0.1 mg Intravenous BID   lidocaine  1 application Urethral Once   mouth rinse  15 mL Mouth Rinse q12n4p   metoprolol tartrate  5 mg Intravenous Q6H   tamsulosin  0.4 mg Oral Once   Continuous Infusions:  dextrose 5 % and 0.45 % NaCl with KCl 20 mEq/L 100  mL/hr at 10/05/18 0618     LOS: 8 days   Time spent= 25 mins    Britaney Espaillat Joline Maxcy, MD [facility]  If 7PM-7AM, please contact night-coverage [email or web address] 10/05/2018, 11:00 AM

## 2018-10-05 NOTE — Plan of Care (Signed)
Poc progressing.  

## 2018-10-06 DIAGNOSIS — C320 Malignant neoplasm of glottis: Secondary | ICD-10-CM

## 2018-10-06 DIAGNOSIS — R1312 Dysphagia, oropharyngeal phase: Secondary | ICD-10-CM

## 2018-10-06 LAB — BASIC METABOLIC PANEL
Anion gap: 10 (ref 5–15)
BUN: 5 mg/dL — ABNORMAL LOW (ref 8–23)
CO2: 24 mmol/L (ref 22–32)
Calcium: 9 mg/dL (ref 8.9–10.3)
Chloride: 102 mmol/L (ref 98–111)
Creatinine, Ser: 0.7 mg/dL (ref 0.61–1.24)
GFR calc Af Amer: >60 mL/min (ref >60)
GFR calc non Af Amer: >60 mL/min (ref >60)
Glucose, Bld: 114 mg/dL — ABNORMAL HIGH (ref 70–99)
Potassium: 4.7 mmol/L (ref 3.5–5.1)
Sodium: 136 mmol/L (ref 135–145)

## 2018-10-06 LAB — GLUCOSE, CAPILLARY
Glucose-Capillary: 110 mg/dL — ABNORMAL HIGH (ref 70–99)
Glucose-Capillary: 139 mg/dL — ABNORMAL HIGH (ref 70–99)
Glucose-Capillary: 135 mg/dL — ABNORMAL HIGH (ref 70–99)
Glucose-Capillary: 136 mg/dL — ABNORMAL HIGH (ref 70–99)
Glucose-Capillary: 149 mg/dL — ABNORMAL HIGH (ref 70–99)

## 2018-10-06 LAB — PHOSPHORUS
Phosphorus: 3.8 mg/dL (ref 2.5–4.6)
Phosphorus: 4.1 mg/dL (ref 2.5–4.6)

## 2018-10-06 LAB — MAGNESIUM
Magnesium: 1.8 mg/dL (ref 1.7–2.4)
Magnesium: 1.7 mg/dL (ref 1.7–2.4)

## 2018-10-06 NOTE — Progress Notes (Signed)
PROGRESS NOTE    Logan Price  PPI:[id number] DOB: 02/01/1946 DOA: 09/27/2018 PCP: [name], FNP   Brief Narrative:  73 year old with history of GERD, essential hypertension, hyperlipidemia who recently diagnosed neck mass concerning for malignancy was admitted to the hospital.  Overnight patient went into SVT therefore medical team was consulted.  Patient was given adenosine x1 which converted back to normal sinus rhythm.   Assessment & Plan:   Principal Problem:   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HCC) Active Problems:   Laryngeal mass   Goals of care, counseling/discussion   Dysphagia   Loss of weight   PSVT (paroxysmal supraventricular tachycardia) (HCC)   Paroxysmal supraventricular tachycardia; Stable -Unknown exact etiology.  So narrow complex tachycardia with regular rate.  Received 1 dose of adenosine, now in normal sinus rhythm with heart rate in 90s. -TSH- Normal. Echo normal.  -Replete Lytes. Keep K >4.0, Mg >2 -On IV Lopressor PRN, EKG better this morning.  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  G-tube planned for Monday -Robinul seems to be helping with secretions. -Nutrition team is following.  Subjective: Feels better, secretions have improved.  No new complaints.  Review of Systems Otherwise negative except as per HPI, including: General = no fevers, chills, dizziness, malaise, fatigue HEENT/EYES = negative for pain, redness, loss of vision, double vision, blurred vision, loss of hearing, sore throat, hoarseness, dysphagia Cardiovascular= negative for chest pain, palpitation, murmurs, lower extremity swelling Respiratory/lungs= negative for shortness of breath, cough, hemoptysis, wheezing, mucus production Gastrointestinal= negative for nausea, vomiting,, abdominal pain, melena, hematemesis Genitourinary= negative for Dysuria,  Hematuria, Change in Urinary Frequency MSK = Negative for arthralgia, myalgias, Back Pain, Joint swelling  Neurology= Negative for headache, seizures, numbness, tingling  Psychiatry= Negative for anxiety, depression, suicidal and homocidal ideation Allergy/Immunology= Medication/Food allergy as listed  Skin= Negative for Rash, lesions, ulcers, itching  Objective: Vitals:   10/06/18 0343 10/06/18 0441 10/06/18 0638 10/06/18 0826  BP:  133/71  (!) 125/99  Pulse: 91 86 (!) 127 85  Resp: 18 (!) 25 (!) 25 (!) 22  Temp:  97.9 F (36.6 C)  98.3 F (36.8 C)  TempSrc:  Oral  Oral  SpO2: 99% 99% 92% 98%  Weight:   57.8 kg   Height:        Intake/Output Summary (Last 24 hours) at 10/06/2018 1147 Last data filed at 10/06/2018 1000 Gross per 24 hour  Intake 4045.12 ml  Output 1550 ml  Net 2495.12 ml   Filed Weights   09/27/18 0848 09/28/18 0600 10/06/18 0638  Weight: 63 kg 60.5 kg 57.8 kg    Examination: Constitutional: NAD, calm, comfortable, trach in place. Eyes: PERRL, lids and conjunctivae normal ENMT: Mucous membranes are moist. Posterior pharynx clear of any exudate or lesions.Normal dentition.  Neck: normal, supple, no masses, no thyromegaly Respiratory: Slight anterior chest wall coarse breath sounds Cardiovascular: Regular rate and rhythm, no murmurs / rubs / gallops. No extremity edema. 2+ pedal pulses. No carotid bruits.  Abdomen: no tenderness, no masses palpated. No hepatosplenomegaly. Bowel sounds positive.  Musculoskeletal: no clubbing / cyanosis. No joint deformity upper and lower extremities. Good ROM, no contractures. Normal muscle tone.  Skin: no rashes, lesions, ulcers. No induration Neurologic: CN 2-12 grossly intact. Sensation intact, DTR normal. Strength 4+/5 in all 4.  Psychiatric: Normal judgment and insight. Alert and oriented x 3. Normal mood.   Unable to  speak due to tracheostomy but clearly able to communicate by lip reading.  Mentating well overall  otherwise.  Coud catheter in place  Data Reviewed:   CBC: Recent Labs  Lab 10/03/18 0400  WBC 10.7*  HGB 12.5*  HCT 37.6*  MCV 87.4  PLT 334   Basic Metabolic Panel: Recent Labs  Lab 10/03/18 0400 10/04/18 0250 10/05/18 0312 10/05/18 1317 10/05/18 1643 10/06/18 0340  NA 135 136 135  --   --  136  K 4.0 3.8 4.5  --   --  4.7  CL 101 104 103  --   --  102  CO2 25 23 23   --   --  24  GLUCOSE 127* 118* 117*  --   --  114*  BUN <5* <5* <5*  --   --  5*  CREATININE 0.87 0.73 0.87  --   --  0.70  CALCIUM 8.9 8.4* 8.4*  --   --  9.0  MG 1.7 1.8 1.8  --  2.0 1.8  PHOS 3.9  --   --  3.5 3.1 3.8   GFR: Estimated Creatinine Clearance: 69.2 mL/min (by C-G formula based on SCr of 0.7 mg/dL). Liver Function Tests: No results for input(s): AST, ALT, ALKPHOS, BILITOT, PROT, ALBUMIN in the last 168 hours. No results for input(s): LIPASE, AMYLASE in the last 168 hours. No results for input(s): AMMONIA in the last 168 hours. Coagulation Profile: Recent Labs  Lab 10/03/18 0400  INR 1.2   Cardiac Enzymes: No results for input(s): CKTOTAL, CKMB, CKMBINDEX, TROPONINI in the last 168 hours. BNP (last 3 results) No results for input(s): PROBNP in the last 8760 hours. HbA1C: No results for input(s): HGBA1C in the last 72 hours. CBG: Recent Labs  Lab 10/05/18 2052 10/05/18 2124 10/05/18 2352 10/06/18 0414 10/06/18 0807  GLUCAP 132* 126* 123* 110* 139*   Lipid Profile: No results for input(s): CHOL, HDL, LDLCALC, TRIG, CHOLHDL, LDLDIRECT in the last 72 hours. Thyroid Function Tests: No results for input(s): TSH, T4TOTAL, FREET4, T3FREE, THYROIDAB in the last 72 hours. Anemia Panel: No results for input(s): VITAMINB12, FOLATE, FERRITIN, TIBC, IRON, RETICCTPCT in the last 72 hours. Sepsis Labs: No results for input(s): PROCALCITON, LATICACIDVEN in the last 168 hours.  Recent Results (from the past 240 hour(s))  SARS Coronavirus 2 (CEPHEID - Performed in [facility]  hospital lab), Hosp Order     Status: None   Collection Time: 09/27/18  7:49 AM   Specimen: Nasopharyngeal Swab  Result Value Ref Range Status   SARS Coronavirus 2 NEGATIVE NEGATIVE Final    Comment: (NOTE) If result is NEGATIVE SARS-CoV-2 target nucleic acids are NOT DETECTED. The SARS-CoV-2 RNA is generally detectable in upper and lower  respiratory specimens during the acute phase of infection. The lowest  concentration of SARS-CoV-2 viral copies this assay can detect is 250  copies / mL. A negative result does not preclude SARS-CoV-2 infection  and should not be used as the sole basis for treatment or other  patient management decisions.  A negative result may occur with  improper specimen collection / handling, submission of specimen other  than nasopharyngeal swab, presence of viral mutation(s) within the  areas targeted by this assay, and inadequate number of viral copies  (<250 copies / mL). A negative result must be combined with clinical  observations, patient history, and epidemiological information. If result is POSITIVE SARS-CoV-2 target nucleic acids are DETECTED. The SARS-CoV-2 RNA is generally detectable in upper and lower  respiratory specimens dur ing the acute phase of infection.  Positive  results are indicative of active infection with SARS-CoV-2.  Clinical  correlation with patient history and other diagnostic information is  necessary to determine patient infection status.  Positive results do  not rule out bacterial infection or co-infection with other viruses. If result is PRESUMPTIVE POSTIVE SARS-CoV-2 nucleic acids MAY BE PRESENT.   A presumptive positive result was obtained on the submitted specimen  and confirmed on repeat testing.  While 2019 novel coronavirus  (SARS-CoV-2) nucleic acids may be present in the submitted sample  additional confirmatory testing may be necessary for epidemiological  and / or clinical management purposes  to differentiate  between  SARS-CoV-2 and other Sarbecovirus currently known to infect humans.  If clinically indicated additional testing with an alternate test  methodology [phone]) is advised. The SARS-CoV-2 RNA is generally  detectable in upper and lower respiratory sp ecimens during the acute  phase of infection. The expected result is Negative. Fact Sheet for Patients:  BoilerBrush.com.cy Fact Sheet for Healthcare Providers: https://pope.com/ This test is not yet approved or cleared by the [place] FDA and has been authorized for detection and/or diagnosis of SARS-CoV-2 by FDA under an Emergency Use Authorization (EUA).  This EUA will remain in effect (meaning this test can be used) for the duration of the COVID-19 declaration under Section 564(b)(1) of the Act, 21 U.S.C. section 360bbb-3(b)(1), unless the authorization is terminated or revoked sooner. Performed at [facility], [address] [place], [place] [address]   SARS Coronavirus 2 (CEPHEID - Performed in [facility] hospital lab), Hosp Order     Status: None   Collection Time: 10/02/18 11:06 AM   Specimen: Nasopharyngeal Swab  Result Value Ref Range Status   SARS Coronavirus 2 NEGATIVE NEGATIVE Final    Comment: (NOTE) If result is NEGATIVE SARS-CoV-2 target nucleic acids are NOT DETECTED. The SARS-CoV-2 RNA is generally detectable in upper and lower  respiratory specimens during the acute phase of infection. The lowest  concentration of SARS-CoV-2 viral copies this assay can detect is 250  copies / mL. A negative result does not preclude SARS-CoV-2 infection  and should not be used as the sole basis for treatment or other  patient management decisions.  A negative result may occur with  improper specimen collection / handling, submission of specimen other  than nasopharyngeal swab, presence of viral mutation(s) within the  areas targeted by this assay, and inadequate  number of viral copies  (<250 copies / mL). A negative result must be combined with clinical  observations, patient history, and epidemiological information. If result is POSITIVE SARS-CoV-2 target nucleic acids are DETECTED. The SARS-CoV-2 RNA is generally detectable in upper and lower  respiratory specimens dur ing the acute phase of infection.  Positive  results are indicative of active infection with SARS-CoV-2.  Clinical  correlation with patient history and other diagnostic information is  necessary to determine patient infection status.  Positive results do  not rule out bacterial infection or co-infection with other viruses. If result is PRESUMPTIVE POSTIVE SARS-CoV-2 nucleic acids MAY BE PRESENT.   A presumptive positive result was obtained on the submitted specimen  and confirmed on repeat testing.  While 2019 novel coronavirus  (SARS-CoV-2) nucleic acids may be present in the submitted sample  additional confirmatory testing may be necessary for epidemiological  and / or clinical management purposes  to differentiate between  SARS-CoV-2 and other Sarbecovirus currently known to infect  humans.  If clinically indicated additional testing with an alternate test  methodology [phone]) is advised. The SARS-CoV-2 RNA is generally  detectable in upper and lower respiratory sp ecimens during the acute  phase of infection. The expected result is Negative. Fact Sheet for Patients:  BoilerBrush.com.cy Fact Sheet for Healthcare Providers: https://pope.com/ This test is not yet approved or cleared by the [place] FDA and has been authorized for detection and/or diagnosis of SARS-CoV-2 by FDA under an Emergency Use Authorization (EUA).  This EUA will remain in effect (meaning this test can be used) for the duration of the COVID-19 declaration under Section 564(b)(1) of the Act, 21 U.S.C. section 360bbb-3(b)(1), unless the  authorization is terminated or revoked sooner. Performed at [facility], [address] [place], [place] [address]          Radiology Studies: Dg Abd 1 View  Result Date: 10/05/2018 CLINICAL DATA:  Is a gastric tube placement. EXAM: ABDOMEN - 1 VIEW COMPARISON:  October 04, 2018 FINDINGS: Nonobstructive bowel gas pattern. Residual contrast throughout the colon and in the appendix. Nonobstructive bowel gas pattern. Enteric catheter overlies the expected location of the gastric cardia with side hole at the expected location of the GE junction. IMPRESSION: Enteric catheter overlies the expected location of the gastric cardia with side hole at the expected location of the GE junction. Electronically Signed   By: Ted Mcalpine M.D.   On: 10/05/2018 15:55   Ir Gastrostomy Tube Mod Sed  Result Date: 10/04/2018 INDICATION: 73 year old male with dysphagia EXAM: PERC PLACEMENT GASTROSTOMY MEDICATIONS: 2 g Ancef ANESTHESIA/SEDATION: Versed 0.5 mg IV; Fentanyl 25 mcg IV Moderate Sedation Time:  0 The patient was continuously monitored during the procedure by the interventional radiology nurse under my direct supervision. CONTRAST:  None FLUOROSCOPY TIME:  Fluoroscopy Time: 0 minutes 30 seconds (4 mGy). COMPLICATIONS: None immediate. PROCEDURE: Informed written consent was obtained from the patient's family after a thorough discussion of the procedural risks, benefits and alternatives. All questions were addressed. Maximal Sterile Barrier Technique was utilized including caps, mask, sterile gowns, sterile gloves, sterile drape, hand hygiene and skin antiseptic. A timeout was performed prior to the initiation of the procedure. The epigastrium was prepped with Betadine in a sterile fashion, and a sterile drape was applied covering the operative field. A sterile gown and sterile gloves were used for the procedure. A 5-French orogastric tube is placed under fluoroscopic guidance. Scout imaging of the  abdomen confirms barium within the transverse colon. The stomach was distended with gas. Multiple x-ray performed with insufflation of the stomach. The partially barium filled colon did not migrate caudally with insufflation of the stomach. X-rays were performed. We withdrew from the procedure given the anatomy. Patient tolerated the procedure well and remained hemodynamically stable throughout. No complications were encountered and no significant blood loss encountered. IMPRESSION: Attempt at percutaneous gastrostomy demonstrates insufficient anatomy for image guided placement. The transverse colon did not migrate caudally with insufflation of the stomach. Signed, Yvone Neu. Loreta Ave, DO [facility] Radiology Electronically Signed   By: Gilmer Mor D.O.   On: 10/04/2018 14:25   Ir Imaging Guided Port Insertion  Result Date: 10/04/2018 INDICATION: 74 year old male with a history squamous cell carcinoma and dysphagia EXAM: IMPLANTED PORT A CATH PLACEMENT WITH ULTRASOUND AND FLUOROSCOPIC GUIDANCE MEDICATIONS: 2 g Ancef; The antibiotic was administered within an appropriate time interval prior to skin puncture. ANESTHESIA/SEDATION: Moderate (conscious) sedation was employed during this procedure. A total of  Versed 1.0 mg and Fentanyl 25 mcg was administered intravenously. Moderate Sedation Time: 19 minutes. The patient's level of consciousness and vital signs were monitored continuously by radiology nursing throughout the procedure under my direct supervision. FLUOROSCOPY TIME:  0 minutes, 6 seconds (1 mGy) COMPLICATIONS: None PROCEDURE: The procedure, risks, benefits, and alternatives were explained to the patient. Questions regarding the procedure were encouraged and answered. The patient understands and consents to the procedure. Ultrasound survey was performed with images stored and sent to PACs. The right neck and chest was prepped with chlorhexidine, and draped  in the usual sterile fashion using maximum barrier technique (cap and mask, sterile gown, sterile gloves, large sterile sheet, hand hygiene and cutaneous antiseptic). Antibiotic prophylaxis was provided with 2.0g Ancef administered IV one hour prior to skin incision. Local anesthesia was attained by infiltration with 1% lidocaine without epinephrine. Ultrasound demonstrated patency of the right internal jugular vein, and this was documented with an image. Under real-time ultrasound guidance, this vein was accessed with a 21 gauge micropuncture needle and image documentation was performed. A small dermatotomy was made at the access site with an 11 scalpel. A 0.018" wire was advanced into the SVC and used to estimate the length of the internal catheter. The access needle exchanged for a 33F micropuncture vascular sheath. The 0.018" wire was then removed and a 0.035" wire advanced into the IVC. An appropriate location for the subcutaneous reservoir was selected below the clavicle and an incision was made through the skin and underlying soft tissues. The subcutaneous tissues were then dissected using a combination of blunt and sharp surgical technique and a pocket was formed. A single lumen power injectable portacatheter was then tunneled through the subcutaneous tissues from the pocket to the dermatotomy and the port reservoir placed within the subcutaneous pocket. The venous access site was then serially dilated and a peel away vascular sheath placed over the wire. The wire was removed and the port catheter advanced into position under fluoroscopic guidance. The catheter tip is positioned in the cavoatrial junction. This was documented with a spot image. The portacatheter was then tested and found to flush and aspirate well. The port was flushed with saline followed by 100 units/mL heparinized saline. The pocket was then closed in two layers using first subdermal inverted interrupted absorbable sutures followed by a  running subcuticular suture. The epidermis was then sealed with Dermabond. The dermatotomy at the venous access site was also seal with Dermabond. Patient tolerated the procedure well and remained hemodynamically stable throughout. No complications encountered and no significant blood loss encountered IMPRESSION: Status post port catheter placement.  Catheter ready for use. Signed, Yvone Neu. Reyne Dumas, RPVI [facility] Radiology Electronically Signed   By: Gilmer Mor D.O.   On: 10/04/2018 14:21        Scheduled Meds:  chlorhexidine  15 mL Mouth Rinse BID   feeding supplement (PRO-STAT SUGAR FREE 64)  30 mL Per Tube BID   glycopyrrolate  0.1 mg Intravenous BID   lidocaine  1 application Urethral Once   mouth rinse  15 mL Mouth Rinse q12n4p   metoprolol tartrate  5 mg Intravenous Q6H   tamsulosin  0.4 mg Oral Once   Continuous Infusions:  dextrose 5 % and 0.45 % NaCl with KCl 20 mEq/L 100 mL/hr at 10/06/18 0418   feeding supplement (OSMOLITE 1.5 CAL) 50 mL/hr at 10/06/18 0831     LOS: 9 days   Time spent= 25 mins  Shontae Rosiles Joline Maxcy, MD [facility]  If 7PM-7AM, please contact night-coverage [email or web address] 10/06/2018, 11:47 AM

## 2018-10-06 NOTE — Consult Note (Signed)
[facility] Surgery Consult/Admission Note  Logan Price 02-11-1946  [id number].    Requesting Provider: Dr. Suszanne Conners Chief Complaint/Reason for Consult: G tube  HPI:   Pt is a 73 yo male with a hx of Hep C, HTN, HLD, GERD, diverticulitis who has undergone trach and laryngeal biopsy on 07/03 by Dr. Suszanne Conners. 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 IR unable to place G tube. We were asked to see for G tube placement. Pt denies abdominal pain or hx of any abdominal surgeries. He is not on anticoagulation. He has no complaints today. He denies SOB, CP, or abdominal pain. He is tolerating tube feeds. He is having bowel function.   ROS:  Review of Systems  Constitutional: Negative for chills, diaphoresis and fever.  HENT: Negative for sore throat.   Respiratory: Negative for cough and shortness of breath.   Cardiovascular: Negative for chest pain.  Gastrointestinal: Negative for abdominal pain, blood in stool, constipation, diarrhea, nausea and vomiting.  Genitourinary: Negative for dysuria.  Skin: Negative for rash.  Neurological: Negative for dizziness and loss of consciousness.  All other systems reviewed and are negative.    Family History  Problem Relation Age of Onset  . Cancer Mother   . Hypertension Father   . Hypertension Sister   . Hypertension Brother   . Hypertension Sister   . Aneurysm Brother 32       brain  . Colon cancer Neg Hx   . Colon polyps Neg Hx     Past Medical History:  Diagnosis Date  . Diverticulitis   . 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 GERD (gastroesophageal reflux disease)   . Hepatitis C   . HTN (hypertension)   . Hyperlipidemia     Past Surgical History:  Procedure Laterality Date  . BIOPSY  05/04/2015   Procedure: BIOPSY;  Surgeon: West Bali, MD;  [facility];  Service: Endoscopy;;  bx's of ileocecal valve   . COLONOSCOPY WITH PROPOFOL N/A 05/04/2015   Dr. Darrick Penna: normal appearing ileum with prominent IC valve  with tubular adenomas, moderate diverticulosis in sigmoid colon, ascending colon, and retum. Moderate sized internal hemorrhoids. Surveillance in 5 years  . ESOPHAGOGASTRODUODENOSCOPY (EGD) WITH PROPOFOL N/A 12/19/2016   Procedure: ESOPHAGOGASTRODUODENOSCOPY (EGD) WITH PROPOFOL;  Surgeon: West Bali, MD;  [facility];  Service: Endoscopy;  Laterality: N/A;  11:30am  . FLEXIBLE SIGMOIDOSCOPY N/A 12/10/2015   hemorrhoid banding X 3   . HEMORRHOID BANDING N/A 12/10/2015   Procedure: HEMORRHOID BANDING;  Surgeon: West Bali, MD;  [facility];  Service: Endoscopy;  Laterality: N/A;  1:30 PM  . IR GASTROSTOMY TUBE MOD SED  10/04/2018  . IR IMAGING GUIDED PORT INSERTION  10/04/2018  . MICROLARYNGOSCOPY N/A 09/27/2018   Procedure: MICRO DIRECT LARYNGOSCOPY WITH BIOPSY;  Surgeon: Newman Pies, MD;  Location: [facility] OR;  Service: ENT;  Laterality: N/A;  . None to date     As of 04/14/15  . POLYPECTOMY  05/04/2015   Procedure: POLYPECTOMY;  Surgeon: West Bali, MD;  [facility];  Service: Endoscopy;;  descending colon polyp, ascending colon polyp  . SAVORY DILATION N/A 12/19/2016   Procedure: SAVORY DILATION;  Surgeon: West Bali, MD;  [facility];  Service: Endoscopy;  Laterality: N/A;  . TRACHEOSTOMY TUBE PLACEMENT N/A 09/27/2018   Procedure: AWAKE TRACHEOSTOMY;  Surgeon: Newman Pies, MD;  Location: [facility] OR;  Service: ENT;  Laterality: N/A;  Social History:  reports that he quit smoking about 12 years ago. His smoking use included cigarettes. He has a 40.00 pack-year smoking history. He has never used smokeless tobacco. He reports current alcohol use. He reports that he does not use drugs.  Allergies: No Known Allergies  Medications Prior to Admission  Medication Sig Dispense Refill  . amLODipine (NORVASC) 10 MG tablet TAKE 1 TABLET DAILY 90 tablet 0  . aspirin EC 81 MG tablet Take 81 mg by mouth daily.    . Cholecalciferol (VITAMIN D) 2000 units CAPS  Take 2,000 Units by mouth daily.    . fluticasone (FLONASE) 50 MCG/ACT nasal spray SPRAY 2 SPRAYS INTO EACH NOSTRIL EVERY DAY (Patient taking differently: Place 2 sprays into both nostrils daily. ) 48 g 0  . Multiple Vitamin (MULTIVITAMIN WITH MINERALS) TABS tablet Take 1 tablet by mouth daily.    . pantoprazole (PROTONIX) 40 MG tablet 1 po 30 mins prior to first meal (Patient taking differently: Take 40 mg by mouth daily. 1 po 30 mins prior to first meal) 90 tablet 3  . simvastatin (ZOCOR) 20 MG tablet TAKE 1 TABLET AT BEDTIME 90 tablet 0    Blood pressure (!) 125/99, pulse 85, temperature 98.3 F (36.8 C), temperature source Oral, resp. rate (!) 22, height 5\' 9"  (1.753 m), weight 57.8 kg, SpO2 98 %.  Physical Exam Vitals signs and nursing note reviewed.  Constitutional:      General: He is not in acute distress.    Appearance: Normal appearance. He is underweight. He is not diaphoretic.  HENT:     Head: Normocephalic and atraumatic.     Nose: Nose normal.     Mouth/Throat:     Lips: Pink.     Mouth: Mucous membranes are moist.     Pharynx: Oropharynx is clear.  Eyes:     General: No scleral icterus.       Right eye: No discharge.        Left eye: No discharge.     Conjunctiva/sclera: Conjunctivae normal.     Pupils: Pupils are equal, round, and reactive to light.  Neck:     Musculoskeletal: Normal range of motion and neck supple.     Comments: Trach in place Cardiovascular:     Rate and Rhythm: Normal rate and regular rhythm.     Pulses:          Radial pulses are 2+ on the right side and 2+ on the left side.     Heart sounds: Normal heart sounds. No murmur.  Pulmonary:     Effort: Pulmonary effort is normal. No respiratory distress.     Breath sounds: Rhonchi (heard throughout) present. No wheezing or rales.     Comments: On trach collar Abdominal:     General: Bowel sounds are normal. There is no distension.     Palpations: Abdomen is soft. Abdomen is not rigid. There is  no hepatomegaly or splenomegaly.     Tenderness: There is no abdominal tenderness. There is no guarding.  Musculoskeletal: Normal range of motion.        General: No tenderness or deformity.     Right lower leg: No edema.     Left lower leg: No edema.  Skin:    General: Skin is warm and dry.     Findings: No rash.  Neurological:     Mental Status: He is alert and oriented to person, place, and time.  Psychiatric:  Mood and Affect: Mood normal.        Behavior: Behavior normal.     Results for orders placed or performed during the hospital encounter of 09/27/18 (from the past 48 hour(s))  Glucose, capillary     Status: None   Collection Time: 10/04/18  5:17 PM  Result Value Ref Range   Glucose-Capillary 94 70 - 99 mg/dL  Basic metabolic panel     Status: Abnormal   Collection Time: 10/05/18  3:12 AM  Result Value Ref Range   Sodium 135 135 - 145 mmol/L   Potassium 4.5 3.5 - 5.1 mmol/L   Chloride 103 98 - 111 mmol/L   CO2 23 22 - 32 mmol/L   Glucose, Bld 117 (H) 70 - 99 mg/dL   BUN <5 (L) 8 - 23 mg/dL   Creatinine, Ser 4.09 0.61 - 1.24 mg/dL   Calcium 8.4 (L) 8.9 - 10.3 mg/dL   GFR calc non Af Amer >60 >60 mL/min   GFR calc Af Amer >60 >60 mL/min   Anion gap 9 5 - 15    Comment: Performed at [facility], [address] [place], [place] [address]  Magnesium     Status: None   Collection Time: 10/05/18  3:12 AM  Result Value Ref Range   Magnesium 1.8 1.7 - 2.4 mg/dL    Comment: Performed at [facility], [address] [place], [place] [address]  Phosphorus     Status: None   Collection Time: 10/05/18  1:17 PM  Result Value Ref Range   Phosphorus 3.5 2.5 - 4.6 mg/dL    Comment: Performed at [facility], [address], [place], [place] [address]  Magnesium     Status: None   Collection Time: 10/05/18  4:43 PM  Result Value Ref Range   Magnesium 2.0 1.7 - 2.4 mg/dL    Comment: Performed at [facility], [address] [place],  [place] [address]  Phosphorus     Status: None   Collection Time: 10/05/18  4:43 PM  Result Value Ref Range   Phosphorus 3.1 2.5 - 4.6 mg/dL    Comment: Performed at [facility], [address] [place], [place] [address]  Glucose, capillary     Status: Abnormal   Collection Time: 10/05/18  8:52 PM  Result Value Ref Range   Glucose-Capillary 132 (H) 70 - 99 mg/dL  Glucose, capillary     Status: Abnormal   Collection Time: 10/05/18  9:24 PM  Result Value Ref Range   Glucose-Capillary 126 (H) 70 - 99 mg/dL  Glucose, capillary     Status: Abnormal   Collection Time: 10/05/18 11:52 PM  Result Value Ref Range   Glucose-Capillary 123 (H) 70 - 99 mg/dL  Basic metabolic panel     Status: Abnormal   Collection Time: 10/06/18  3:40 AM  Result Value Ref Range   Sodium 136 135 - 145 mmol/L   Potassium 4.7 3.5 - 5.1 mmol/L   Chloride 102 98 - 111 mmol/L   CO2 24 22 - 32 mmol/L   Glucose, Bld 114 (H) 70 - 99 mg/dL   BUN 5 (L) 8 - 23 mg/dL   Creatinine, Ser 2.95 0.61 - 1.24 mg/dL   Calcium 9.0 8.9 - 28.4 mg/dL   GFR calc non Af Amer >60 >60 mL/min   GFR calc Af Amer >60 >60 mL/min   Anion gap 10 5 - 15  Comment: Performed at [facility], [address], [place], [place] [address]  Magnesium     Status: None   Collection Time: 10/06/18  3:40 AM  Result Value Ref Range   Magnesium 1.8 1.7 - 2.4 mg/dL    Comment: Performed at [facility], [address], [place], [place] [address]  Phosphorus     Status: None   Collection Time: 10/06/18  3:40 AM  Result Value Ref Range   Phosphorus 3.8 2.5 - 4.6 mg/dL    Comment: Performed at [facility], [address], [place], [place] [address]  Glucose, capillary     Status: Abnormal   Collection Time: 10/06/18  4:14 AM  Result Value Ref Range   Glucose-Capillary 110 (H) 70 - 99 mg/dL  Glucose, capillary     Status: Abnormal   Collection Time: 10/06/18  8:07 AM  Result Value Ref Range   Glucose-Capillary 139 (H) 70 - 99 mg/dL    Dg Abd 1 View  Result Date: 10/05/2018 CLINICAL DATA:  Is a gastric tube placement. EXAM: ABDOMEN - 1 VIEW COMPARISON:  October 04, 2018 FINDINGS: Nonobstructive bowel gas pattern. Residual contrast throughout the colon and in the appendix. Nonobstructive bowel gas pattern. Enteric catheter overlies the expected location of the gastric cardia with side hole at the expected location of the GE junction. IMPRESSION: Enteric catheter overlies the expected location of the gastric cardia with side hole at the expected location of the GE junction. Electronically Signed   By: Ted Mcalpine M.D.   On: 10/05/2018 15:55   Ir Gastrostomy Tube Mod Sed  Result Date: 10/04/2018 INDICATION: 73 year old male with dysphagia EXAM: PERC PLACEMENT GASTROSTOMY MEDICATIONS: 2 g Ancef ANESTHESIA/SEDATION: Versed 0.5 mg IV; Fentanyl 25 mcg IV Moderate Sedation Time:  0 The patient was continuously monitored during the procedure by the interventional radiology nurse under my direct supervision. CONTRAST:  None FLUOROSCOPY TIME:  Fluoroscopy Time: 0 minutes 30 seconds (4 mGy). COMPLICATIONS: None immediate. PROCEDURE: Informed written consent was obtained from the patient's family after a thorough discussion of the procedural risks, benefits and alternatives. All questions were addressed. Maximal Sterile Barrier Technique was utilized including caps, mask, sterile gowns, sterile gloves, sterile drape, hand hygiene and skin antiseptic. A timeout was performed prior to the initiation of the procedure. The epigastrium was prepped with Betadine in a sterile fashion, and a sterile drape was applied covering the operative field. A sterile gown and sterile gloves were used for the procedure. A 5-French orogastric tube is placed under fluoroscopic guidance. Scout imaging of the abdomen confirms barium within the transverse colon. The stomach was distended with gas. Multiple x-ray performed with insufflation of the stomach. The partially  barium filled colon did not migrate caudally with insufflation of the stomach. X-rays were performed. We withdrew from the procedure given the anatomy. Patient tolerated the procedure well and remained hemodynamically stable throughout. No complications were encountered and no significant blood loss encountered. IMPRESSION: Attempt at percutaneous gastrostomy demonstrates insufficient anatomy for image guided placement. The transverse colon did not migrate caudally with insufflation of the stomach. Signed, Yvone Neu. Loreta Ave, DO [facility] Radiology Electronically Signed   By: Gilmer Mor D.O.   On: 10/04/2018 14:25   Ir Imaging Guided Port Insertion  Result Date: 10/04/2018 INDICATION: 73 year old male with a history squamous cell carcinoma and dysphagia EXAM: IMPLANTED PORT A CATH PLACEMENT WITH ULTRASOUND AND FLUOROSCOPIC GUIDANCE MEDICATIONS: 2 g Ancef; The antibiotic  was administered within an appropriate time interval prior to skin puncture. ANESTHESIA/SEDATION: Moderate (conscious) sedation was employed during this procedure. A total of Versed 1.0 mg and Fentanyl 25 mcg was administered intravenously. Moderate Sedation Time: 19 minutes. The patient's level of consciousness and vital signs were monitored continuously by radiology nursing throughout the procedure under my direct supervision. FLUOROSCOPY TIME:  0 minutes, 6 seconds (1 mGy) COMPLICATIONS: None PROCEDURE: The procedure, risks, benefits, and alternatives were explained to the patient. Questions regarding the procedure were encouraged and answered. The patient understands and consents to the procedure. Ultrasound survey was performed with images stored and sent to PACs. The right neck and chest was prepped with chlorhexidine, and draped in the usual sterile fashion using maximum barrier technique (cap and mask, sterile gown, sterile gloves, large sterile sheet, hand hygiene and cutaneous  antiseptic). Antibiotic prophylaxis was provided with 2.0g Ancef administered IV one hour prior to skin incision. Local anesthesia was attained by infiltration with 1% lidocaine without epinephrine. Ultrasound demonstrated patency of the right internal jugular vein, and this was documented with an image. Under real-time ultrasound guidance, this vein was accessed with a 21 gauge micropuncture needle and image documentation was performed. A small dermatotomy was made at the access site with an 11 scalpel. A 0.018" wire was advanced into the SVC and used to estimate the length of the internal catheter. The access needle exchanged for a 42F micropuncture vascular sheath. The 0.018" wire was then removed and a 0.035" wire advanced into the IVC. An appropriate location for the subcutaneous reservoir was selected below the clavicle and an incision was made through the skin and underlying soft tissues. The subcutaneous tissues were then dissected using a combination of blunt and sharp surgical technique and a pocket was formed. A single lumen power injectable portacatheter was then tunneled through the subcutaneous tissues from the pocket to the dermatotomy and the port reservoir placed within the subcutaneous pocket. The venous access site was then serially dilated and a peel away vascular sheath placed over the wire. The wire was removed and the port catheter advanced into position under fluoroscopic guidance. The catheter tip is positioned in the cavoatrial junction. This was documented with a spot image. The portacatheter was then tested and found to flush and aspirate well. The port was flushed with saline followed by 100 units/mL heparinized saline. The pocket was then closed in two layers using first subdermal inverted interrupted absorbable sutures followed by a running subcuticular suture. The epidermis was then sealed with Dermabond. The dermatotomy at the venous access site was also seal with Dermabond. Patient  tolerated the procedure well and remained hemodynamically stable throughout. No complications encountered and no significant blood loss encountered IMPRESSION: Status post port catheter placement.  Catheter ready for use. Signed, Yvone Neu. Reyne Dumas, RPVI [facility] Radiology Electronically Signed   By: Gilmer Mor D.O.   On: 10/04/2018 14:21      Assessment/Plan Principal Problem:   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HCC) Active Problems:   Laryngeal mass   Goals of care, counseling/discussion   Dysphagia   Loss of weight   PSVT (paroxysmal supraventricular tachycardia) (HCC)   G tube - will post for tomorrow for likely laparoscopic possibly open G tube with Dr. Sheliah Hatch - stop tube feeds at midnight  Thank you for the consult    Jerre Simon, [facility] Surgery 10/06/2018, 8:28 AM Pager: [phone] Consults: [phone] Mon-Fri 7:00 am-4:30 pm Sat-Sun 7:00 am-11:30 am

## 2018-10-06 NOTE — Plan of Care (Signed)
Poc progressing.  

## 2018-10-06 NOTE — Progress Notes (Signed)
Subjective: No new issues overnight. Tolerated tube feed.  Objective: Vital signs in last 24 hours: Temp:  [97.6 F (36.4 C)-98.3 F (36.8 C)] 97.7 F (36.5 C) (07/12 1207) Pulse Rate:  [82-127] 93 (07/12 1207) Resp:  [16-25] 24 (07/12 1207) BP: (112-168)/(65-99) 112/65 (07/12 1207) SpO2:  [92 %-100 %] 100 % (07/12 1207) FiO2 (%):  [28 %] 28 % (07/12 1222) Weight:  [57.8 kg] 57.8 kg (07/12 7353)  General appearance:alert, cooperative and no distress Head:Normocephalic, without obvious abnormality, atraumatic Eyes:conjunctivae/corneas clear. PERRL, EOM's intact.  Ears:normal TM's and external ear canals both ears Nose:Nares normal. Septum midline. Mucosa normal. No drainage or sinus tenderness. Throat:lips, mucosa, and tongue normal; Neck:Trach midline. No bleeding. Neurologic:Grossly normal  No results for input(s): WBC, HGB, HCT, PLT in the last 72 hours. Recent Labs    10/05/18 0312 10/06/18 0340  NA 135 136  K 4.5 4.7  CL 103 102  CO2 23 24  GLUCOSE 117* 114*  BUN <5* 5*  CREATININE 0.87 0.70  CALCIUM 8.4* 9.0    Medications:  I have reviewed the patient's current medications. Scheduled: . chlorhexidine  15 mL Mouth Rinse BID  . feeding supplement (PRO-STAT SUGAR FREE 64)  30 mL Per Tube BID  . glycopyrrolate  0.1 mg Intravenous BID  . lidocaine  1 application Urethral Once  . mouth rinse  15 mL Mouth Rinse q12n4p  . metoprolol tartrate  5 mg Intravenous Q6H  . tamsulosin  0.4 mg Oral Once   Continuous: . dextrose 5 % and 0.45 % NaCl with KCl 20 mEq/L 100 mL/hr at 10/06/18 0418  . 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 - G-tube placement per general surgery tomorrow. Hold tube feed after midnight. - Appreciate hospitalist's help in medical management. -Neck CT shows extensive laryngeal/pharyngeal/esophagealSCCA. -Trach changed to  anuncuffed#[id number]. - Will start flomaxand low-dose lopressoronce the G-tube is in place.    LOS: 9 days   Santhiago Collingsworth W Requan Hardge 10/06/2018, 1:41 PM

## 2018-10-06 NOTE — Progress Notes (Signed)
Gail catheter placed in pt. Sterile technique maintained. Clear straw colored urine returned with good output. Pt tolerated well. Catheter secured per protocol.

## 2018-10-07 ENCOUNTER — Inpatient Hospital Stay (HOSPITAL_COMMUNITY): Payer: Medicare Other | Admitting: Certified Registered"

## 2018-10-07 ENCOUNTER — Encounter (HOSPITAL_COMMUNITY): Payer: Self-pay | Admitting: Orthopedic Surgery

## 2018-10-07 ENCOUNTER — Encounter (HOSPITAL_COMMUNITY)
Admission: RE | Disposition: A | Discharge status: Home Health | Payer: Self-pay | Source: Home / Self Care | Attending: Otolaryngology

## 2018-10-07 DIAGNOSIS — C320 Malignant neoplasm of glottis: Secondary | ICD-10-CM

## 2018-10-07 DIAGNOSIS — R1312 Dysphagia, oropharyngeal phase: Secondary | ICD-10-CM

## 2018-10-07 HISTORY — PX: LAPAROSCOPIC INSERTION GASTROSTOMY TUBE: SHX6817

## 2018-10-07 LAB — GLUCOSE, CAPILLARY
Glucose-Capillary: 125 mg/dL — ABNORMAL HIGH (ref 70–99)
Glucose-Capillary: 138 mg/dL — ABNORMAL HIGH (ref 70–99)
Glucose-Capillary: 141 mg/dL — ABNORMAL HIGH (ref 70–99)
Glucose-Capillary: 143 mg/dL — ABNORMAL HIGH (ref 70–99)
Glucose-Capillary: 170 mg/dL — ABNORMAL HIGH (ref 70–99)
Glucose-Capillary: 183 mg/dL — ABNORMAL HIGH (ref 70–99)

## 2018-10-07 LAB — BASIC METABOLIC PANEL
Anion gap: 7 (ref 5–15)
BUN: 7 mg/dL — ABNORMAL LOW (ref 8–23)
CO2: 28 mmol/L (ref 22–32)
Calcium: 8.8 mg/dL — ABNORMAL LOW (ref 8.9–10.3)
Chloride: 100 mmol/L (ref 98–111)
Creatinine, Ser: 0.77 mg/dL (ref 0.61–1.24)
GFR calc Af Amer: >60 mL/min (ref >60)
GFR calc non Af Amer: >60 mL/min (ref >60)
Glucose, Bld: 120 mg/dL — ABNORMAL HIGH (ref 70–99)
Potassium: 5.1 mmol/L (ref 3.5–5.1)
Sodium: 135 mmol/L (ref 135–145)

## 2018-10-07 LAB — PHOSPHORUS: Phosphorus: 4.1 mg/dL (ref 2.5–4.6)

## 2018-10-07 LAB — MAGNESIUM
Magnesium: 1.8 mg/dL (ref 1.7–2.4)
Magnesium: 1.6 mg/dL — ABNORMAL LOW (ref 1.7–2.4)

## 2018-10-07 SURGERY — INSERTION, GASTROSTOMY TUBE, PERCUTANEOUS
Anesthesia: General | Site: Abdomen | Laterality: Left

## 2018-10-07 MED ORDER — DEXAMETHASONE SODIUM PHOSPHATE 10 MG/ML IJ SOLN
INTRAMUSCULAR | Status: DC | PRN
  Administered 2018-10-07: 8 mg via INTRAVENOUS

## 2018-10-07 MED ORDER — SUCCINYLCHOLINE CHLORIDE 200 MG/10ML IV SOSY
PREFILLED_SYRINGE | INTRAVENOUS | Status: AC
  Filled 2018-10-07: qty 10

## 2018-10-07 MED ORDER — ONDANSETRON HCL 4 MG/2ML IJ SOLN
INTRAMUSCULAR | Status: AC
  Filled 2018-10-07: qty 2

## 2018-10-07 MED ORDER — MIDAZOLAM HCL 2 MG/2ML IJ SOLN
INTRAMUSCULAR | Status: AC
  Filled 2018-10-07: qty 2

## 2018-10-07 MED ORDER — ROCURONIUM BROMIDE 10 MG/ML (PF) SYRINGE
PREFILLED_SYRINGE | INTRAVENOUS | Status: AC
  Filled 2018-10-07: qty 10

## 2018-10-07 MED ORDER — PRO-STAT SUGAR FREE PO LIQD
30.0000 mL | Freq: Two times a day (BID) | ORAL | Status: DC
  Administered 2018-10-07 – 2018-10-21 (×28): 30 mL
  Filled 2018-10-07 (×28): qty 30

## 2018-10-07 MED ORDER — LACTATED RINGERS IV SOLN
INTRAVENOUS | Status: DC | PRN
  Administered 2018-10-07: 09:00:00 via INTRAVENOUS

## 2018-10-07 MED ORDER — 0.9 % SODIUM CHLORIDE (POUR BTL) OPTIME
TOPICAL | Status: DC | PRN
  Administered 2018-10-07: 10:00:00 1000 mL

## 2018-10-07 MED ORDER — EPHEDRINE SULFATE-NACL 50-0.9 MG/10ML-% IV SOSY
PREFILLED_SYRINGE | INTRAVENOUS | Status: DC | PRN
  Administered 2018-10-07: 5 mg via INTRAVENOUS

## 2018-10-07 MED ORDER — LACTATED RINGERS IV SOLN
INTRAVENOUS | Status: DC
  Administered 2018-10-07: 09:00:00 via INTRAVENOUS

## 2018-10-07 MED ORDER — MIDAZOLAM HCL 5 MG/5ML IJ SOLN
INTRAMUSCULAR | Status: DC | PRN
  Administered 2018-10-07: 1 mg via INTRAVENOUS

## 2018-10-07 MED ORDER — SODIUM CHLORIDE 0.9 % IV SOLN
INTRAVENOUS | Status: DC | PRN
  Administered 2018-10-07: 09:00:00 100 ug/min via INTRAVENOUS

## 2018-10-07 MED ORDER — DEXTROSE-NACL 5-0.45 % IV SOLN
INTRAVENOUS | Status: DC
  Administered 2018-10-07 – 2018-10-10 (×6): via INTRAVENOUS

## 2018-10-07 MED ORDER — OSMOLITE 1.5 CAL PO LIQD
1000.0000 mL | ORAL | Status: DC
  Administered 2018-10-07 – 2018-10-09 (×3): 1000 mL
  Filled 2018-10-07 (×5): qty 1000

## 2018-10-07 MED ORDER — ROCURONIUM BROMIDE 50 MG/5ML IV SOSY
PREFILLED_SYRINGE | INTRAVENOUS | Status: DC | PRN
  Administered 2018-10-07: 40 mg via INTRAVENOUS

## 2018-10-07 MED ORDER — FENTANYL CITRATE (PF) 250 MCG/5ML IJ SOLN
INTRAMUSCULAR | Status: AC
  Filled 2018-10-07: qty 5

## 2018-10-07 MED ORDER — PHENYLEPHRINE 40 MCG/ML (10ML) SYRINGE FOR IV PUSH (FOR BLOOD PRESSURE SUPPORT)
PREFILLED_SYRINGE | INTRAVENOUS | Status: DC | PRN
  Administered 2018-10-07: 160 ug via INTRAVENOUS
  Administered 2018-10-07: 120 ug via INTRAVENOUS

## 2018-10-07 MED ORDER — BUPIVACAINE-EPINEPHRINE (PF) 0.25% -1:200000 IJ SOLN
INTRAMUSCULAR | Status: AC
  Filled 2018-10-07: qty 30

## 2018-10-07 MED ORDER — CEFAZOLIN SODIUM-DEXTROSE 2-3 GM-%(50ML) IV SOLR
INTRAVENOUS | Status: DC | PRN
  Administered 2018-10-07: 2 g via INTRAVENOUS

## 2018-10-07 MED ORDER — PROPOFOL 10 MG/ML IV BOLUS
INTRAVENOUS | Status: AC
  Filled 2018-10-07: qty 20

## 2018-10-07 MED ORDER — BUPIVACAINE HCL (PF) 0.25 % IJ SOLN
INTRAMUSCULAR | Status: AC
  Filled 2018-10-07: qty 30

## 2018-10-07 MED ORDER — DEXAMETHASONE SODIUM PHOSPHATE 10 MG/ML IJ SOLN
INTRAMUSCULAR | Status: AC
  Filled 2018-10-07: qty 1

## 2018-10-07 MED ORDER — EPHEDRINE 5 MG/ML INJ
INTRAVENOUS | Status: AC
  Filled 2018-10-07: qty 10

## 2018-10-07 MED ORDER — LIDOCAINE 2% (20 MG/ML) 5 ML SYRINGE
INTRAMUSCULAR | Status: DC | PRN
  Administered 2018-10-07: 100 mg via INTRAVENOUS

## 2018-10-07 MED ORDER — PHENYLEPHRINE 40 MCG/ML (10ML) SYRINGE FOR IV PUSH (FOR BLOOD PRESSURE SUPPORT)
PREFILLED_SYRINGE | INTRAVENOUS | Status: AC
  Filled 2018-10-07: qty 10

## 2018-10-07 MED ORDER — FENTANYL CITRATE (PF) 100 MCG/2ML IJ SOLN: 25.0000 ug | INTRAMUSCULAR | Status: DC | PRN

## 2018-10-07 MED ORDER — LIDOCAINE 2% (20 MG/ML) 5 ML SYRINGE
INTRAMUSCULAR | Status: AC
  Filled 2018-10-07: qty 5

## 2018-10-07 MED ORDER — ONDANSETRON HCL 4 MG/2ML IJ SOLN
INTRAMUSCULAR | Status: DC | PRN
  Administered 2018-10-07: 4 mg via INTRAVENOUS

## 2018-10-07 MED ORDER — BUPIVACAINE-EPINEPHRINE 0.25% -1:200000 IJ SOLN
INTRAMUSCULAR | Status: DC | PRN
  Administered 2018-10-07: 3 mL

## 2018-10-07 MED ORDER — FENTANYL CITRATE (PF) 100 MCG/2ML IJ SOLN
INTRAMUSCULAR | Status: DC | PRN
  Administered 2018-10-07: 50 ug via INTRAVENOUS

## 2018-10-07 MED ORDER — PROPOFOL 10 MG/ML IV BOLUS
INTRAVENOUS | Status: DC | PRN
  Administered 2018-10-07: 70 mg via INTRAVENOUS

## 2018-10-07 MED ORDER — SUGAMMADEX SODIUM 200 MG/2ML IV SOLN
INTRAVENOUS | Status: DC | PRN
  Administered 2018-10-07: 200 mg via INTRAVENOUS

## 2018-10-07 SURGICAL SUPPLY — 41 items
BAG URINE DRAINAGE (UROLOGICAL SUPPLIES) IMPLANT
BLADE CLIPPER SURG (BLADE) IMPLANT
CANISTER SUCT 3000ML PPV (MISCELLANEOUS) IMPLANT
CHLORAPREP W/TINT 26 (MISCELLANEOUS) ×2 IMPLANT
COVER SURGICAL LIGHT HANDLE (MISCELLANEOUS) ×2 IMPLANT
COVER WAND RF STERILE (DRAPES) ×2 IMPLANT
DECANTER SPIKE VIAL GLASS SM (MISCELLANEOUS) ×3 IMPLANT
DERMABOND ADVANCED (GAUZE/BANDAGES/DRESSINGS) ×1
DERMABOND ADVANCED .7 DNX12 (GAUZE/BANDAGES/DRESSINGS) ×1 IMPLANT
ELECT REM PT RETURN 9FT ADLT (ELECTROSURGICAL) ×2
ELECTRODE REM PT RTRN 9FT ADLT (ELECTROSURGICAL) ×1 IMPLANT
GLOVE BIOGEL PI IND STRL 7.0 (GLOVE) ×1 IMPLANT
GLOVE BIOGEL PI INDICATOR 7.0 (GLOVE) ×1
GLOVE SURG SS PI 7.0 STRL IVOR (GLOVE) ×2 IMPLANT
GOWN STRL REUS W/ TWL LRG LVL3 (GOWN DISPOSABLE) ×3 IMPLANT
GOWN STRL REUS W/TWL LRG LVL3 (GOWN DISPOSABLE) ×3
KIT BASIN OR (CUSTOM PROCEDURE TRAY) ×2 IMPLANT
KIT TURNOVER KIT B (KITS) ×2 IMPLANT
NEEDLE 22X1 1/2 (OR ONLY) (NEEDLE) ×2 IMPLANT
NS IRRIG 1000ML POUR BTL (IV SOLUTION) ×2 IMPLANT
PAD ARMBOARD 7.5X6 YLW CONV (MISCELLANEOUS) ×4 IMPLANT
PENCIL SMOKE EVACUATOR (MISCELLANEOUS) ×1 IMPLANT
PLUG CATH AND CAP STER (CATHETERS) IMPLANT
SCISSORS LAP 5X35 DISP (ENDOMECHANICALS) IMPLANT
SET GASTRO INIT PLACEMENT MED (SET/KITS/TRAYS/PACK) ×1 IMPLANT
SET IRRIG TUBING LAPAROSCOPIC (IRRIGATION / IRRIGATOR) IMPLANT
SET SUT ANCHOR GASTROINTEST (SET/KITS/TRAYS/PACK) ×1 IMPLANT
SET TUBE SMOKE EVAC HIGH FLOW (TUBING) ×2 IMPLANT
SLEEVE ENDOPATH XCEL 5M (ENDOMECHANICALS) ×2 IMPLANT
SPONGE DRAIN TRACH 4X4 STRL 2S (GAUZE/BANDAGES/DRESSINGS) ×1 IMPLANT
SUT ETHILON 2 0 FS 18 (SUTURE) IMPLANT
SUT MNCRL AB 4-0 PS2 18 (SUTURE) ×2 IMPLANT
TOWEL GREEN STERILE (TOWEL DISPOSABLE) ×2 IMPLANT
TOWEL GREEN STERILE FF (TOWEL DISPOSABLE) ×2 IMPLANT
TRAY LAPAROSCOPIC MC (CUSTOM PROCEDURE TRAY) ×2 IMPLANT
TROCAR XCEL BLUNT TIP 100MML (ENDOMECHANICALS) IMPLANT
TROCAR XCEL NON-BLD 5MMX100MML (ENDOMECHANICALS) ×2 IMPLANT
TUBE CONNECTING 12X1/4 (SUCTIONS) ×1 IMPLANT
TUBE GASTROSTOMY 18F (CATHETERS) ×1 IMPLANT
WATER STERILE IRR 1000ML POUR (IV SOLUTION) ×2 IMPLANT
YANKAUER SUCT BULB TIP NO VENT (SUCTIONS) ×1 IMPLANT

## 2018-10-07 NOTE — Transfer of Care (Signed)
Immediate Anesthesia Transfer of Care Note  Patient: Logan Price  Procedure(s) Performed: LAPAROSCOPIC  GASTROSTOMY TUBE (Left Abdomen)  Patient Location: PACU  Anesthesia Type:General  Level of Consciousness: awake and patient cooperative  Airway & Oxygen Therapy: Patient Spontanous Breathing and Patient connected to tracheostomy mask oxygen  Post-op Assessment: Report given to RN and Post -op Vital signs reviewed and stable  Post vital signs: Reviewed and stable  Last Vitals:  Vitals Value Taken Time  BP 166/87 10/07/18 1011  Temp    Pulse 92 10/07/18 1013  Resp 25 10/07/18 1013  SpO2 100 % 10/07/18 1013  Vitals shown include unvalidated device data.  Last Pain:  Vitals:   10/07/18 0734  TempSrc: Oral  PainSc:          Complications: No apparent anesthesia complications

## 2018-10-07 NOTE — H&P (Signed)
Pre Procedure note for inpatients:   Logan Price has been scheduled for Procedure(s): LAPAROSCOPIC POSSIBLE OPEN GASTROSTOMY TUBE (N/A) today. The various methods of treatment have been discussed with the patient. After consideration of the risks, benefits and treatment options the patient has consented to the planned procedure.   The patient has been seen and labs reviewed. There are no changes in the patient's condition to prevent proceeding with the planned procedure today.  Recent labs:  Lab Results  Component Value Date   WBC 10.7 (H) 10/03/2018   HGB 12.5 (L) 10/03/2018   HCT 37.6 (L) 10/03/2018   PLT 334 10/03/2018   GLUCOSE 120 (H) 10/07/2018   CHOL 163 04/25/2018   TRIG 86 04/25/2018   HDL 83 04/25/2018   LDLCALC 63 04/25/2018   ALT 16 04/25/2018   AST 21 04/25/2018   NA 135 10/07/2018   K 5.1 10/07/2018   CL 100 10/07/2018   CREATININE 0.77 10/07/2018   BUN 7 (L) 10/07/2018   CO2 28 10/07/2018   TSH 2.426 10/03/2018   PSA 0.40 10/08/2012   INR 1.2 10/03/2018   HGBA1C 5.8 10/24/2016    Mickeal Skinner, MD 10/07/2018 8:31 AM

## 2018-10-07 NOTE — Progress Notes (Signed)
Logan Price   DOB:1945/10/14   WJ#:[id number]    HEME/ONC PROBLEMS: 1. 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 Basilio Cairo of radiation oncology and H&N navigator Young Berry -While the patient is hospitalized, we are unable to coordinate outpatient PET scheduling -When he is safe to be discharged from ENT and cardiology standpoint, we will expedite his outpatient work-up and treatment (likely concurrent chemoRT) ASAP -Patient expressed understanding, and agreed to the plan  Dysphagia -Secondary to bulky glottic tumor -S/p feeding tube placement on 10/07/2018 -Nutrition following; appreciate their assistance -Continue tube feeding per nutritional recs  Increased airway secretions -I recommend patient to be provided equipment for home suction, given his copious secretions from tracheostomy, to avoid blockage    Thank you for the opportunity to participate in Logan Price's care.  We will assist with coordinating his outpatient oncology clinic follow-up upon discharge.  Please feel free to contact us if there are any questions.   Arthur Holms, MD 10/07/2018  9:29 PM   Subjective:  Logan Price reports doing okay this afternoon. He underwent feeding tube placement well without any complications. He still has quite a bit of secretions from his tracheostomy. He denies any significant abdominal pain from the feeding tube placement.  ROS: Constitutional: ( - ) fevers, ( - )  chills , ( - ) night sweats Ears, nose, mouth, throat, and face: ( - ) mucositis, ( + ) sore throat Respiratory: ( + ) cough, ( - ) dyspnea, ( - ) wheezes Cardiovascular: ( - )  palpitation, ( - ) chest discomfort, ( - ) lower extremity swelling Gastrointestinal:  ( - ) nausea, ( - ) heartburn, ( - ) change in bowel habits Skin: ( - ) abnormal skin rashes Behavioral/Psych: ( - ) mood change, ( - ) new changes  All other systems were reviewed with the patient and are negative.  Objective:  Vitals:   10/07/18 1946 10/07/18 2003  BP: 111/65 111/65  Pulse: 96 (!) 102  Resp: 13 15  Temp: 98.7 F (37.1 C)   SpO2: 100% 96%     Intake/Output Summary (Last 24 hours) at 10/07/2018 2129 Last data filed at 10/07/2018 1338 Gross per 24 hour  Intake 2591.69 ml  Output 1150 ml  Net 1441.69 ml    GENERAL: alert, no distress, thin  SKIN: skin color, texture, turgor are normal, no rashes or significant lesions EYES: conjunctiva are pink and non-injected, sclera clear OROPHARYNX: no exudate, no erythema; lips, buccal mucosa, and tongue normal  NECK: tracheostomy in place, bulky cervical adenopathy bilaterally  LUNGS: clear to auscultation on anterior auscultation, some coarse upper respiratory noises from secretions  HEART: regular rate & rhythm and no murmurs and no lower extremity edema ABDOMEN: soft, non-tender, non-distended, normal bowel sounds, feeding tube in place PSYCH: alert & awake, whispering due to tracheostomy and difficulty speaking    Labs:  Lab Results  Component Value Date   WBC 10.7 (H) 10/03/2018   HGB 12.5 (L) 10/03/2018   HCT 37.6 (L) 10/03/2018   MCV 87.4 10/03/2018   PLT 334 10/03/2018   NEUTROABS 3.8 04/25/2018    Lab Results  Component Value Date  NA 135 10/07/2018   K 5.1 10/07/2018   CL 100 10/07/2018   CO2 28 10/07/2018    Studies:  No results found.

## 2018-10-07 NOTE — Anesthesia Preprocedure Evaluation (Addendum)
Anesthesia Evaluation  Patient identified by MRN, date of birth, ID band Patient awake    Reviewed: Allergy & Precautions, H&P , NPO status , Patient's Chart, lab work & pertinent test results  Airway Mallampati: Trach       Dental no notable dental hx. (+) Teeth Intact, Dental Advisory Given   Pulmonary neg pulmonary ROS, former smoker,    Pulmonary exam normal breath sounds clear to auscultation       Cardiovascular hypertension, Pt. on medications + dysrhythmias Supra Ventricular Tachycardia  Rhythm:Regular Rate:Normal     Neuro/Psych negative neurological ROS  negative psych ROS   GI/Hepatic Neg liver ROS, GERD  Medicated and Controlled,  Endo/Other  negative endocrine ROS  Renal/GU negative Renal ROS  negative genitourinary   Musculoskeletal   Abdominal   Peds  Hematology negative hematology ROS (+)   Anesthesia Other Findings   Reproductive/Obstetrics negative OB ROS                            Anesthesia Physical Anesthesia Plan  ASA: III  Anesthesia Plan: General   Post-op Pain Management:    Induction: Intravenous  PONV Risk Score and Plan: 3 and Ondansetron, Dexamethasone and Midazolam  Airway Management Planned: Tracheostomy  Additional Equipment:   Intra-op Plan:   Post-operative Plan: Extubation in OR  Informed Consent: I have reviewed the patients History and Physical, chart, labs and discussed the procedure including the risks, benefits and alternatives for the proposed anesthesia with the patient or authorized representative who has indicated his/her understanding and acceptance.     Dental advisory given  Plan Discussed with: CRNA  Anesthesia Plan Comments:         Anesthesia Quick Evaluation

## 2018-10-07 NOTE — Progress Notes (Signed)
Nutrition Follow-up  DOCUMENTATION CODES:   Severe malnutrition in context of chronic illness  INTERVENTION:   Monitor magnesium, potassium, and phosphorus daily for at least 3 days, MD to replete as needed, as pt continues to be at risk for refeeding syndrome.   Once G-tube cleared to be used:  -Initiate Osmolite 1.5@20ml /hr -Increase by 10 ml Q 8 hours to goal rate of 65 ml/hr (1560 ml) -30 ml ProstatBID  At goal TF provides:2540kcals, 128grams protein, 116ml free water.Meets 100% of needs.  NUTRITION DIAGNOSIS:   Severe Malnutrition related to chronic illness, cancer and cancer related treatments as evidenced by percent weight loss, severe muscle depletion, moderate muscle depletion, mild fat depletion.  Ongoing  GOAL:   Patient will meet greater than or equal to 90% of their needs  Addressed via TF  MONITOR:   Labs, I & O's, Skin, TF tolerance, Weight trends  REASON FOR ASSESSMENT:   NPO/Clear Liquid Diet    ASSESSMENT:   Patient with PMH significant for diverticulitis, gastritis, HLD, HTN, laryngeal cancer, and esophogeal dysphagia. 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   7/3- tracheostomy, laryngoscopy and biopsy  7/10- IR unable to place G-tube due to anatomy; colon anterior to stomach 7/11- NGT placed- TF started 7/13- G-tube placed   RD performed NFPE. Unable to obtain nutrition history as pt was lethargic post procedure. Spoke with RN who reports pt had loose stools with Osmolite yesterday.Unsure if this is exclusively from tube feeding. It looks to have slowed this am. Will continue with Osmolite 1.5 once G-tube able to be used. Will monitor for symptoms and make adjustments to formula if needed.   Admission weight: 63 kg Current weight: 60.1 kg   I/O: +9,132 ml since admit UOP: 3,100 ml x 24 hrs   Medications: D5 in 1/2NS @ 100 ml/hr Labs: CBG 110-183  NUTRITION -  FOCUSED PHYSICAL EXAM:    Most Recent Value  Orbital Region  No depletion  Upper Arm Region  Moderate depletion  Thoracic and Lumbar Region  Unable to assess  Buccal Region  Mild depletion  Temple Region  Severe depletion  Clavicle Bone Region  Moderate depletion  Clavicle and Acromion Bone Region  Severe depletion  Scapular Bone Region  Unable to assess  Dorsal Hand  Moderate depletion  Patellar Region  Severe depletion  Anterior Thigh Region  Severe depletion  Posterior Calf Region  Severe depletion  Edema (RD Assessment)  None  Hair  Reviewed  Eyes  Reviewed  Mouth  Reviewed  Skin  Reviewed  Nails  Reviewed     Diet Order:   Diet Order            Diet NPO time specified Except for: Sips with Meds  Diet effective midnight              EDUCATION NEEDS:   Not appropriate for education at this time  Skin:  Skin Assessment: Skin Integrity Issues: Skin Integrity Issues:: Incisions Incisions: neck/abdomen  Last BM:  7/13  Height:   Ht Readings from Last 1 Encounters:  09/27/18 5\' 9"  (1.753 m)    Weight:   Wt Readings from Last 1 Encounters:  10/07/18 60.1 kg    Ideal Body Weight:  72.7 kg  BMI:  Body mass index is 19.55 kg/m.  Estimated Nutritional Needs:   Kcal:  0347-4259 kcal  Protein:  115-130 grams  Fluid:  >/= 2.3 L/day   [facility]  RD, LDN Clinical Nutrition Pager # - [phone]

## 2018-10-07 NOTE — Progress Notes (Signed)
PROGRESS NOTE    Logan Price  WUX:[id number] DOB: 11/29/1945 DOA: 09/27/2018 PCP: [name], FNP   Brief Narrative:  73 year old with history of GERD, essential hypertension, hyperlipidemia who recently diagnosed neck mass concerning for malignancy was admitted to the hospital.  Overnight patient went into SVT therefore medical team was consulted.  Patient was given adenosine x1 which converted back to normal sinus rhythm.   Assessment & Plan:   Principal Problem:   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HCC) Active Problems:   Laryngeal mass   Goals of care, counseling/discussion   Dysphagia   Loss of weight   PSVT (paroxysmal supraventricular tachycardia) (HCC)   Paroxysmal supraventricular tachycardia; Stable -Unknown exact etiology.  So narrow complex tachycardia with regular rate.  Received 1 dose of adenosine, now in normal sinus rhythm with heart rate in 90s. -TSH- Normal. Echo normal.  -Replete Lytes. Keep K >4.0, Mg >2 -On IV Lopressor PRN, EKG better this morning.  Will consider metoprolol 12.5 mg twice daily when able to administer via G-tube.  Currently on Lopressor 5 mg 3 times daily.  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  Status post G-tube placement 7/13.  Start feeding once cleared by surgery. -Robinul seems to be helping with secretions. -Nutrition team is following.  Subjective: Tolerated the procedure well.  Quite drowsy postoperatively during my evaluation but no complaints.  Easily arousable to his name.  Review of Systems Otherwise negative except as per HPI, including: General = no fevers, chills, dizziness, malaise, fatigue HEENT/EYES = negative for pain, redness, loss of vision, double vision, blurred vision, loss of hearing, sore throat, hoarseness, dysphagia Cardiovascular= negative for chest pain, palpitation, murmurs, lower extremity swelling Respiratory/lungs= negative for  shortness of breath, cough, hemoptysis, wheezing, mucus production Gastrointestinal= negative for nausea, vomiting,, abdominal pain, melena, hematemesis Genitourinary= negative for Dysuria, Hematuria, Change in Urinary Frequency MSK = Negative for arthralgia, myalgias, Back Pain, Joint swelling  Neurology= Negative for headache, seizures, numbness, tingling  Psychiatry= Negative for anxiety, depression, suicidal and homocidal ideation Allergy/Immunology= Medication/Food allergy as listed  Skin= Negative for Rash, lesions, ulcers, itching  Objective: Vitals:   10/07/18 1011 10/07/18 1026 10/07/18 1041 10/07/18 1103  BP: (!) 166/87 131/75 126/67   Pulse: 94 87 88   Resp: 17 (!) 21 18 18   Temp: 97.9 F (36.6 C)  97.9 F (36.6 C)   TempSrc:      SpO2: 100% 99% 99% 97%  Weight:      Height:        Intake/Output Summary (Last 24 hours) at 10/07/2018 1133 Last data filed at 10/07/2018 1014 Gross per 24 hour  Intake 3322.04 ml  Output 2250 ml  Net 1072.04 ml   Filed Weights   09/28/18 0600 10/06/18 0638 10/07/18 0522  Weight: 60.5 kg 57.8 kg 60.1 kg    Examination: Constitutional: NAD, calm, comfortable, tracheostomy in place Eyes: PERRL, lids and conjunctivae normal ENMT: Mucous membranes are moist. Posterior pharynx clear of any exudate or lesions.Normal dentition.  Neck: normal, supple, no masses, no thyromegaly Respiratory: Slight anterior chest wall coarse breath sounds Cardiovascular: Regular rate and rhythm, no murmurs / rubs / gallops. No extremity edema. 2+ pedal pulses. No carotid bruits.  Abdomen: G-tube noted on his abdomen Musculoskeletal: no clubbing / cyanosis. No joint deformity upper and lower extremities. Good ROM, no contractures. Normal muscle tone.  Skin: no rashes, lesions, ulcers. No induration Neurologic: No focal neuro deficits.  Quite  drowsy postoperatively but easily arousable to his name. Psychiatric: Easily arousable to his name.   Coud catheter  in place  Data Reviewed:   CBC: Recent Labs  Lab 10/03/18 0400  WBC 10.7*  HGB 12.5*  HCT 37.6*  MCV 87.4  PLT 334   Basic Metabolic Panel: Recent Labs  Lab 10/03/18 0400 10/04/18 0250 10/05/18 0312 10/05/18 1317 10/05/18 1643 10/06/18 0340 10/06/18 1648 10/07/18 0447  NA 135 136 135  --   --  136  --  135  K 4.0 3.8 4.5  --   --  4.7  --  5.1  CL 101 104 103  --   --  102  --  100  CO2 25 23 23   --   --  24  --  28  GLUCOSE 127* 118* 117*  --   --  114*  --  120*  BUN <5* <5* <5*  --   --  5*  --  7*  CREATININE 0.87 0.73 0.87  --   --  0.70  --  0.77  CALCIUM 8.9 8.4* 8.4*  --   --  9.0  --  8.8*  MG 1.7 1.8 1.8  --  2.0 1.8 1.7 1.8  PHOS 3.9  --   --  3.5 3.1 3.8 4.1 4.1   GFR: Estimated Creatinine Clearance: 72 mL/min (by C-G formula based on SCr of 0.77 mg/dL). Liver Function Tests: No results for input(s): AST, ALT, ALKPHOS, BILITOT, PROT, ALBUMIN in the last 168 hours. No results for input(s): LIPASE, AMYLASE in the last 168 hours. No results for input(s): AMMONIA in the last 168 hours. Coagulation Profile: Recent Labs  Lab 10/03/18 0400  INR 1.2   Cardiac Enzymes: No results for input(s): CKTOTAL, CKMB, CKMBINDEX, TROPONINI in the last 168 hours. BNP (last 3 results) No results for input(s): PROBNP in the last 8760 hours. HbA1C: No results for input(s): HGBA1C in the last 72 hours. CBG: Recent Labs  Lab 10/06/18 1558 10/06/18 1940 10/06/18 2359 10/07/18 0511 10/07/18 0802  GLUCAP 136* 149* 138* 183* 125*   Lipid Profile: No results for input(s): CHOL, HDL, LDLCALC, TRIG, CHOLHDL, LDLDIRECT in the last 72 hours. Thyroid Function Tests: No results for input(s): TSH, T4TOTAL, FREET4, T3FREE, THYROIDAB in the last 72 hours. Anemia Panel: No results for input(s): VITAMINB12, FOLATE, FERRITIN, TIBC, IRON, RETICCTPCT in the last 72 hours. Sepsis Labs: No results for input(s): PROCALCITON, LATICACIDVEN in the last 168 hours.  Recent Results  (from the past 240 hour(s))  SARS Coronavirus 2 (CEPHEID - Performed in [facility] hospital lab), Hosp Order     Status: None   Collection Time: 10/02/18 11:06 AM   Specimen: Nasopharyngeal Swab  Result Value Ref Range Status   SARS Coronavirus 2 NEGATIVE NEGATIVE Final    Comment: (NOTE) If result is NEGATIVE SARS-CoV-2 target nucleic acids are NOT DETECTED. The SARS-CoV-2 RNA is generally detectable in upper and lower  respiratory specimens during the acute phase of infection. The lowest  concentration of SARS-CoV-2 viral copies this assay can detect is 250  copies / mL. A negative result does not preclude SARS-CoV-2 infection  and should not be used as the sole basis for treatment or other  patient management decisions.  A negative result may occur with  improper specimen collection / handling, submission of specimen other  than nasopharyngeal swab, presence of viral mutation(s) within the  areas targeted by this assay, and inadequate number of viral copies  (<250 copies /  mL). A negative result must be combined with clinical  observations, patient history, and epidemiological information. If result is POSITIVE SARS-CoV-2 target nucleic acids are DETECTED. The SARS-CoV-2 RNA is generally detectable in upper and lower  respiratory specimens dur ing the acute phase of infection.  Positive  results are indicative of active infection with SARS-CoV-2.  Clinical  correlation with patient history and other diagnostic information is  necessary to determine patient infection status.  Positive results do  not rule out bacterial infection or co-infection with other viruses. If result is PRESUMPTIVE POSTIVE SARS-CoV-2 nucleic acids MAY BE PRESENT.   A presumptive positive result was obtained on the submitted specimen  and confirmed on repeat testing.  While 2019 novel coronavirus  (SARS-CoV-2) nucleic acids may be present in the submitted sample  additional confirmatory testing may be  necessary for epidemiological  and / or clinical management purposes  to differentiate between  SARS-CoV-2 and other Sarbecovirus currently known to infect humans.  If clinically indicated additional testing with an alternate test  methodology [phone]) is advised. The SARS-CoV-2 RNA is generally  detectable in upper and lower respiratory sp ecimens during the acute  phase of infection. The expected result is Negative. Fact Sheet for Patients:  BoilerBrush.com.cy Fact Sheet for Healthcare Providers: https://pope.com/ This test is not yet approved or cleared by the [place] FDA and has been authorized for detection and/or diagnosis of SARS-CoV-2 by FDA under an Emergency Use Authorization (EUA).  This EUA will remain in effect (meaning this test can be used) for the duration of the COVID-19 declaration under Section 564(b)(1) of the Act, 21 U.S.C. section 360bbb-3(b)(1), unless the authorization is terminated or revoked sooner. Performed at [facility], [address], [place], [place] [address]          Radiology Studies: Dg Abd 1 View  Result Date: 10/05/2018 CLINICAL DATA:  Is a gastric tube placement. EXAM: ABDOMEN - 1 VIEW COMPARISON:  October 04, 2018 FINDINGS: Nonobstructive bowel gas pattern. Residual contrast throughout the colon and in the appendix. Nonobstructive bowel gas pattern. Enteric catheter overlies the expected location of the gastric cardia with side hole at the expected location of the GE junction. IMPRESSION: Enteric catheter overlies the expected location of the gastric cardia with side hole at the expected location of the GE junction. Electronically Signed   By: Ted Mcalpine M.D.   On: 10/05/2018 15:55        Scheduled Meds: . chlorhexidine  15 mL Mouth Rinse BID  . feeding supplement (PRO-STAT SUGAR FREE 64)  30 mL Per Tube BID  . glycopyrrolate  0.1 mg Intravenous BID  . lidocaine  1  application Urethral Once  . mouth rinse  15 mL Mouth Rinse q12n4p  . metoprolol tartrate  5 mg Intravenous Q6H  . tamsulosin  0.4 mg Oral Once   Continuous Infusions: . dextrose 5 % and 0.45 % NaCl with KCl 20 mEq/L 100 mL/hr at 10/07/18 0126  . feeding supplement (OSMOLITE 1.5 CAL) Stopped (10/07/18 0050)  . lactated ringers 10 mL/hr at 10/07/18 0859     LOS: 10 days   Time spent= 25 mins    Dianelys Scinto Joline Maxcy, MD [facility]  If 7PM-7AM, please contact night-coverage [email or web address] 10/07/2018, 11:33 AM

## 2018-10-07 NOTE — Op Note (Signed)
Preoperative diagnosis: laryngeal cancer, failure to thrive  Postoperative diagnosis: same   Procedure: laparoscopic placement of gastrostomy by T bars and push technique  Surgeon: Gurney Maxin, M.D.  Asst: Jackson Latino  Anesthesia: general  Indications for procedure: Logan Price is a 73 y.o. year old male with largyngeal cancer in need of nutritional access.  Description of procedure: The patient was brought into the operative suite. Anesthesia was administered with General endotracheal anesthesia. WHO checklist was applied. The patient was then placed in supine position. The area was prepped and draped in the usual sterile fashion.  Next, a small right subcostal incision was made. A 32mm trocar was used to gain access to the peritoneal cavity by optical entry technique. Pneumoperitoneum was applied with a high flow and low pressure. The laparoscope was reinserted to confirm position. 1 additional 5 mm trocar was placed in the right mid abdomen.  The stomach was high and slightly distended and the distal body was able to reach the left abdominal wall easily.  3 t bars were entered into the area and through the stomach in the distal body confirmed by endoscope and secured in place in a diamond arrangement. An incision was made in the middle of the diamond and 14ga needle used to access the stomach. A J wire was introduced and the tract was dilated up to a 41fr. The wire and dilators were removed to leave the introducer tube and an 18 fr gastrostomy tube was inserted and pull away sheath removed. The gastrostomy was pulled to the stomach wall and the stopper was apposed against the skin at 3 cm. The patient tolerated the procedure well. All counts were correct.  Findings: g tube intact to stomach body with 3 t bars securing it and wafer at 3cm at skin.  Specimen: none  Implant: 18 fr gastrostomy tube   Blood loss: <20  Local anesthesia: 5 ml marcaine   Complications: none  Gurney Maxin, M.D. General, Bariatric, & Minimally Invasive Surgery [facility]

## 2018-10-07 NOTE — Anesthesia Postprocedure Evaluation (Signed)
Anesthesia Post Note  Patient: Logan Price  Procedure(s) Performed: LAPAROSCOPIC  GASTROSTOMY TUBE (Left Abdomen)     Patient location during evaluation: PACU Anesthesia Type: General Level of consciousness: awake and alert Pain management: pain level controlled Vital Signs Assessment: post-procedure vital signs reviewed and stable Respiratory status: spontaneous breathing, nonlabored ventilation, respiratory function stable and patient connected to tracheostomy mask oxygen Cardiovascular status: blood pressure returned to baseline and stable Postop Assessment: no apparent nausea or vomiting Anesthetic complications: no    Last Vitals:  Vitals:   10/07/18 1026 10/07/18 1041  BP: 131/75 126/67  Pulse: 87 88  Resp: (!) 21 18  Temp:  36.6 C  SpO2: 99% 99%    Last Pain:  Vitals:   10/07/18 1041  TempSrc:   PainSc: 0-No pain                 Marisol Glazer,W. EDMOND

## 2018-10-07 NOTE — Progress Notes (Signed)
PT Cancellation Note  Patient Details Name: Logan Price MRN: [id number] DOB: 1946-03-12   Cancelled Treatment:    Reason Eval/Treat Not Completed: Patient at procedure or test/unavailable Will re-attempt as time allows.    Lanney Gins, PT, DPT Supplemental Physical Therapist 10/07/18 8:51 AM Pager: [phone] Office: [phone]

## 2018-10-07 NOTE — Progress Notes (Signed)
Subjective: No issues overnight.  Objective: Vital signs in last 24 hours: Temp:  [97.7 F (36.5 C)-98.5 F (36.9 C)] 97.9 F (36.6 C) (07/13 1011) Pulse Rate:  [80-105] 87 (07/13 1026) Resp:  [16-24] 21 (07/13 1026) BP: (112-166)/(65-87) 131/75 (07/13 1026) SpO2:  [87 %-100 %] 99 % (07/13 1026) FiO2 (%):  [28 %] 28 % (07/13 0734) Weight:  [60.1 kg] 60.1 kg (07/13 0522)  General appearance:alert, cooperative and no distress Head:Normocephalic, without obvious abnormality, atraumatic Eyes:conjunctivae/corneas clear. PERRL, EOM's intact.  Ears:normal TM's and external ear canals both ears Nose:Nares normal. Septum midline. Mucosa normal. No drainage or sinus tenderness. Throat:lips, mucosa, and tongue normal; Neck:Trach midline. No bleeding. Neurologic:Grossly normal  No results for input(s): WBC, HGB, HCT, PLT in the last 72 hours. Recent Labs    10/06/18 0340 10/07/18 0447  NA 136 135  K 4.7 5.1  CL 102 100  CO2 24 28  GLUCOSE 114* 120*  BUN 5* 7*  CREATININE 0.70 0.77  CALCIUM 9.0 8.8*    Medications:  I have reviewed the patient's current medications. Scheduled: . [MAR Hold] chlorhexidine  15 mL Mouth Rinse BID  . [MAR Hold] feeding supplement (PRO-STAT SUGAR FREE 64)  30 mL Per Tube BID  . [MAR Hold] glycopyrrolate  0.1 mg Intravenous BID  . [MAR Hold] lidocaine  1 application Urethral Once  . [MAR Hold] mouth rinse  15 mL Mouth Rinse q12n4p  . [MAR Hold] metoprolol tartrate  5 mg Intravenous Q6H  . [MAR Hold] tamsulosin  0.4 mg Oral Once   Continuous: . dextrose 5 % and 0.45 % NaCl with KCl 20 mEq/L 100 mL/hr at 10/07/18 0126  . feeding supplement (OSMOLITE 1.5 CAL) Stopped (10/07/18 0050)  . lactated ringers 10 mL/hr at 10/07/18 4142    Assessment/Plan: Laryngeal SCCA, s/p trach - G-tube placement per general surgery today. - Appreciate hospitalist's help in medical management. -Neck CT shows extensive  laryngeal/pharyngeal/esophagealSCCA. -Trach changed to anuncuffed#[id number]. - Will start flomaxand low-dose lopressoronce the G-tube is in place. - Trach and G-tube care teaching.    LOS: 10 days   Errin Whitelaw W Micky Sheller 10/07/2018, 10:35 AM

## 2018-10-07 NOTE — Anesthesia Procedure Notes (Signed)
Procedure Name: Intubation Date/Time: 10/07/2018 9:18 AM Performed by: Orlie Dakin, CRNA Pre-anesthesia Checklist: Patient identified, Emergency Drugs available, Suction available and Patient being monitored Patient Re-evaluated:Patient Re-evaluated prior to induction Oxygen Delivery Method: Circle system utilized Preoxygenation: Pre-oxygenation with 100% oxygen Induction Type: IV induction Tube size: 5.5 mm Placement Confirmation: positive ETCO2 Tube secured with: Tape Comments: Pre-O2 with 100 % O2 via trach. IV sedation then trach removed, 5.5. ETT lubricated placed in trach stoma site. +tCO2 present, then induction.

## 2018-10-08 ENCOUNTER — Inpatient Hospital Stay (HOSPITAL_COMMUNITY): Payer: Medicare Other

## 2018-10-08 ENCOUNTER — Encounter (HOSPITAL_COMMUNITY): Payer: Self-pay | Admitting: General Surgery

## 2018-10-08 DIAGNOSIS — C320 Malignant neoplasm of glottis: Secondary | ICD-10-CM

## 2018-10-08 DIAGNOSIS — E43 Unspecified severe protein-calorie malnutrition: Secondary | ICD-10-CM | POA: Insufficient documentation

## 2018-10-08 DIAGNOSIS — R1312 Dysphagia, oropharyngeal phase: Secondary | ICD-10-CM

## 2018-10-08 LAB — GLUCOSE, CAPILLARY
Glucose-Capillary: 134 mg/dL — ABNORMAL HIGH (ref 70–99)
Glucose-Capillary: 137 mg/dL — ABNORMAL HIGH (ref 70–99)
Glucose-Capillary: 133 mg/dL — ABNORMAL HIGH (ref 70–99)
Glucose-Capillary: 115 mg/dL — ABNORMAL HIGH (ref 70–99)
Glucose-Capillary: 119 mg/dL — ABNORMAL HIGH (ref 70–99)
Glucose-Capillary: 135 mg/dL — ABNORMAL HIGH (ref 70–99)

## 2018-10-08 LAB — BASIC METABOLIC PANEL
Anion gap: 7 (ref 5–15)
BUN: 8 mg/dL (ref 8–23)
CO2: 27 mmol/L (ref 22–32)
Calcium: 8.6 mg/dL — ABNORMAL LOW (ref 8.9–10.3)
Chloride: 102 mmol/L (ref 98–111)
Creatinine, Ser: 0.77 mg/dL (ref 0.61–1.24)
GFR calc Af Amer: >60 mL/min (ref >60)
GFR calc non Af Amer: >60 mL/min (ref >60)
Glucose, Bld: 131 mg/dL — ABNORMAL HIGH (ref 70–99)
Potassium: 4.2 mmol/L (ref 3.5–5.1)
Sodium: 136 mmol/L (ref 135–145)

## 2018-10-08 LAB — MAGNESIUM: Magnesium: 1.6 mg/dL — ABNORMAL LOW (ref 1.7–2.4)

## 2018-10-08 MED ORDER — DOXAZOSIN MESYLATE 1 MG PO TABS
1.0000 mg | ORAL_TABLET | Freq: Every day | ORAL | Status: DC
  Administered 2018-10-08 – 2018-11-08 (×30): 1 mg
  Filled 2018-10-08 (×31): qty 1

## 2018-10-08 MED ORDER — MAGNESIUM SULFATE 2 GM/50ML IV SOLN
2.0000 g | Freq: Once | INTRAVENOUS | Status: AC
  Administered 2018-10-08: 2 g via INTRAVENOUS
  Filled 2018-10-08: qty 50

## 2018-10-08 MED ORDER — SIMETHICONE 80 MG PO CHEW
160.0000 mg | CHEWABLE_TABLET | Freq: Four times a day (QID) | ORAL | Status: DC
  Administered 2018-10-08 – 2018-11-08 (×108): 160 mg
  Filled 2018-10-08 (×105): qty 2

## 2018-10-08 MED ORDER — METOPROLOL TARTRATE 25 MG/10 ML ORAL SUSPENSION
12.5000 mg | Freq: Two times a day (BID) | ORAL | Status: DC
  Administered 2018-10-08 – 2018-10-09 (×4): 12.5 mg
  Filled 2018-10-08 (×5): qty 5

## 2018-10-08 NOTE — Progress Notes (Signed)
Speech Language Pathology Treatment: [name] Speaking valve  Patient Details Name: Logan Price MRN: [id number] DOB: 09-01-1945 Today's Date: 10/08/2018 Time: 2952-8413 SLP Time Calculation (min) (ACUTE ONLY): 11 min  Assessment / Plan / Recommendation Clinical Impression  Pt had PMV in place upon SLP arrival, stating that he put it on this morning to help him manage his secretions better. He is using his yankauer with Mod I per his report, but he did not need it during this session. He is donning and doffing his valve himself. No air trapping was noted upon SLP inspection and VS are stable. His voice remains hoarse, and likely will pending treatment of mass, but his vocal intensity appears to be getting stronger. He verbalized additional precautions to take and methods of cleaning/maintaining PMV with Min cues. Education was provided about the importance of f/u SLP throughout treatment to provide continued focus on communication and swallowing. Pt returned demonstration of effortful swallow and Mendelsohn maneuver today and was encouraged to do them throughout the day to continue to utilize these muscles.    HPI HPI: Pt is a 73 yo male admitted for planned trach and laryngeal biopsy 7/2. 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 Esophagram 6/26 showed silent gross aspiration of barium. PMH includes: HLD, HTN, hepatitis C, GERD, diverticulitis      SLP Plan  Continue with current plan of care       Recommendations  Diet recommendations: NPO Medication Administration: Via alternative means      Patient may use Passy-Muir Speech Valve: During all waking hours (remove during sleep) PMSV Supervision: Intermittent         Oral Care Recommendations: Oral care QID Follow up Recommendations: Outpatient SLP;Home health SLP (may need to start with [facility] SLP, but would transition to OP as able) SLP Visit Diagnosis:  Dysphagia, unspecified (R13.10) Plan: Continue with current plan of care       GO                Virl Axe Iyona Pehrson 10/08/2018, 12:23 PM

## 2018-10-08 NOTE — Progress Notes (Signed)
S: no complaints O: BP 103/70   Pulse 83   Temp 98.6 F (37 C) (Oral)   Resp 18   Ht 5\' 9"  (1.753 m)   Wt 61.2 kg   SpO2 95%   BMI 19.92 kg/m  Gen: NAD Abd: g tube in position and function, incisions c/d/i  A/P POD 1 lap g tube -continue g tube use, may advance to goal -will follow up with him in office

## 2018-10-08 NOTE — Plan of Care (Signed)
Poc progressing.  

## 2018-10-08 NOTE — Progress Notes (Signed)
Physical Therapy Treatment Patient Details Name: Logan Price MRN: [id number] DOB: 20-Jun-1945 Today's Date: 10/08/2018    History of Present Illness Pt is a 73 yo male admitted for planned trach and laryngeal biopsy 7/2. 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 Esophagram 6/26 showed silent gross aspiration of barium. PMH includes: HLD, HTN, hepatitis C, GERD, diverticulitis    PT Comments    Pt agreeable to ambulation with therapy and as therapist organized lines and leads pt utilized mirror and suctioned secretions in preparation for walking. Pt is limited in safe mobility by increased lines for PEG and O2 for trach in presence of generalized weakness and slightly decreased safety awareness. Pt is mod I for bed mobility, supervision for transfers and minA for ambulation of 470 feet. Pt is making good progress towards his goals. PT will continue to follow acutely.    Follow Up Recommendations  Home health PT     Equipment Recommendations  Other (comment)(TBA)    Recommendations for Other Services       Precautions / Restrictions Precautions Precautions: Fall Restrictions Weight Bearing Restrictions: No    Mobility  Bed Mobility Overal bed mobility: Modified Independent             General bed mobility comments: assist for lines, HoB elevated  Transfers Overall transfer level: Needs assistance Equipment used: None Transfers: Sit to/from Stand Sit to Stand: Supervision         General transfer comment: hips too far back in bed on first attempt, with scooting hips forward supervision for safety with power up and steadying in RW  Ambulation/Gait Ambulation/Gait assistance: Min guard;Min assist Gait Distance (Feet): 470 Feet Assistive device: Rolling walker (2 wheeled) Gait Pattern/deviations: Step-through pattern Gait velocity: moderate to too fast. Gait velocity interpretation: 1.31 - 2.62  ft/sec, indicative of limited community ambulator General Gait Details: increased knee flexion with decreased foot clearance, for mildly unsteady gait requiring ocassional min A for steadying      Balance Overall balance assessment: Needs assistance   Sitting balance-Leahy Scale: Good       Standing balance-Leahy Scale: Fair                              Cognition Arousal/Alertness: Awake/alert Behavior During Therapy: WFL for tasks assessed/performed Overall Cognitive Status: Within Functional Limits for tasks assessed                                           General Comments General comments (skin integrity, edema, etc.): Pt on 28%O2 via trach collar, SaO2 dropped to min 80%s with ambulation however poor waveform and pt asymptomatic. With readjustment of pulseox once back in room immediately rebounded to 98%O2      Pertinent Vitals/Pain Pain Assessment: No/denies pain           PT Goals (current goals can now be found in the care plan section) Acute Rehab PT Goals Patient Stated Goal: back independence PT Goal Formulation: With patient Time For Goal Achievement: 10/15/18 Potential to Achieve Goals: Good Progress towards PT goals: Progressing toward goals    Frequency    Min 3X/week      PT Plan Current plan remains appropriate       AM-PAC PT "6 Clicks" Mobility   Outcome Measure  Help needed turning from your back to your side while in a flat bed without using bedrails?: None Help needed moving from lying on your back to sitting on the side of a flat bed without using bedrails?: None Help needed moving to and from a bed to a chair (including a wheelchair)?: A Little Help needed standing up from a chair using your arms (e.g., wheelchair or bedside chair)?: A Little Help needed to walk in hospital room?: A Little Help needed climbing 3-5 steps with a railing? : A Little 6 Click Score: 20    End of Session Equipment Utilized  During Treatment: Gait belt;Oxygen Activity Tolerance: Patient tolerated treatment well Patient left: Other (comment);with call bell/phone within reach Nurse Communication: Mobility status PT Visit Diagnosis: Unsteadiness on feet (R26.81);Difficulty in walking, not elsewhere classified (R26.2)     Time: 9604-5409 PT Time Calculation (min) (ACUTE ONLY): 25 min  Charges:  $Gait Training: 23-37 mins                     Anishka Bushard B. Beverely Risen PT, DPT Acute Rehabilitation Services Pager [phone] Office [phone]    Elon Alas Fleet 10/08/2018, 4:08 PM

## 2018-10-08 NOTE — Progress Notes (Signed)
Spoke to patient's wife, Dedra Skeens and gave her updates on patient. Told her that we will schedule a time for her and her brother in law to be educated on patient's g tube tomorrow. Patient states it will be better for her to come after 1:00 tomorrow as she has a doctor's appointment that she must attend.

## 2018-10-08 NOTE — Progress Notes (Signed)
Pulled patient foley catheter with no complication. Patient tolerated well. He will be due to void by 2400.  Will monitor urine output.

## 2018-10-08 NOTE — Consult Note (Signed)
DENTAL CONSULTATION  Date of Consultation:  10/08/2018 Patient Name:   Logan Price Date of Birth:   [birth year] Medical Record Number: [id number]  COVID 19 SCREENING: The patient does not symptoms concerning for COVID-19 infection (Including fever, chills, cough, or new SHORTNESS OF BREATH).    VITALS: BP 103/70   Pulse 83   Temp 98.6 F (37 C) (Oral)   Resp 18   Ht 5\' 9"  (1.753 m)   Wt 61.2 kg   SpO2 95%   BMI 19.92 kg/m   CHIEF COMPLAINT: Patient referred by Dr. Dion Body and Dr. Basilio Cairo for a dental consultation.  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  Patient is also status post tracheostomy by Dr. Suszanne Conners.  Patient with anticipated chemoradiation therapy.  Patient now seen as part of a medically necessary pre-chemoradiation therapy dental protocol examination.  The patient currently denies acute toothaches, swellings, or abscesses.  Patient was last seen in January of 2020 to have a maxillary acrylic partial denture inserted.  The treatment was provided by Dr. Peri Jefferson in [place], [place].  Prior to that, the patient was seen for an exam and cleaning in December of 2019.  Patient is usually seen for periodontal maintenance treatments on an every 88-month basis by patient report.  Patient denies having a lower partial denture.  Patient denies having dental phobia.  PROBLEM LIST: Patient Active Problem List   Diagnosis Date Noted  . 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    Priority: High  . Protein-calorie malnutrition, severe 10/08/2018  . PSVT (paroxysmal supraventricular tachycardia) (HCC) 10/03/2018  . Atrial fibrillation (HCC) 10/03/2018  . Goals of care, counseling/discussion   . Dysphagia   . Loss of weight   . Laryngeal mass 09/27/2018  . Gastritis determined by endoscopy   . Esophageal dysphagia   . 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 Globus sensation 12/12/2016   . Smokes with greater than 40 pack year history 10/20/2016  . Hemorrhoids, internal, with bleeding 10/06/2015  . History of adenomatous polyp of colon 04/14/2015  . Vitamin D deficiency 10/09/2014  . HLD (hyperlipidemia) 10/08/2012  . GERD (gastroesophageal reflux disease) 10/08/2012  . HTN (hypertension)   . Diverticulitis 10/25/2009    PMH: Past Medical History:  Diagnosis Date  . Diverticulitis   . 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 GERD (gastroesophageal reflux disease)   . Hepatitis C   . HTN (hypertension)   . Hyperlipidemia     PSH: Past Surgical History:  Procedure Laterality Date  . BIOPSY  05/04/2015   Procedure: BIOPSY;  Surgeon: [place] Bali, MD;  Location: [facility];  Service: Endoscopy;;  bx's of ileocecal valve   . COLONOSCOPY WITH PROPOFOL N/A 05/04/2015   Dr. Darrick Penna: normal appearing ileum with prominent IC valve with tubular adenomas, moderate diverticulosis in sigmoid colon, ascending colon, and retum. Moderate sized internal hemorrhoids. Surveillance in 5 years  . ESOPHAGOGASTRODUODENOSCOPY (EGD) WITH PROPOFOL N/A 12/19/2016   Procedure: ESOPHAGOGASTRODUODENOSCOPY (EGD) WITH PROPOFOL;  Surgeon: [place] Bali, MD;  Location: [facility];  Service: Endoscopy;  Laterality: N/A;  11:30am  . FLEXIBLE SIGMOIDOSCOPY N/A 12/10/2015   hemorrhoid banding X 3   . HEMORRHOID BANDING N/A 12/10/2015   Procedure: HEMORRHOID BANDING;  Surgeon: West Bali, MD;  Location: [facility];  Service: Endoscopy;  Laterality: N/A;  1:30 PM  . IR GASTROSTOMY TUBE MOD SED  10/04/2018  . IR IMAGING GUIDED PORT INSERTION  10/04/2018  . LAPAROSCOPIC INSERTION GASTROSTOMY TUBE Left 10/07/2018   Procedure: LAPAROSCOPIC  GASTROSTOMY TUBE;  Surgeon: Kinsinger, De Blanch, MD;  Location: [facility] OR;  Service: General;  Laterality: Left;  Marland Kitchen MICROLARYNGOSCOPY N/A 09/27/2018   Procedure: MICRO DIRECT LARYNGOSCOPY WITH BIOPSY;  Surgeon: [place] Pies, MD;  Location: [facility] OR;   Service: ENT;  Laterality: N/A;  . None to date     As of 04/14/15  . POLYPECTOMY  05/04/2015   Procedure: POLYPECTOMY;  Surgeon: West Bali, MD;  Location: [facility];  Service: Endoscopy;;  descending colon polyp, ascending colon polyp  . SAVORY DILATION N/A 12/19/2016   Procedure: SAVORY DILATION;  Surgeon: West Bali, MD;  Location: [facility];  Service: Endoscopy;  Laterality: N/A;  . TRACHEOSTOMY TUBE PLACEMENT N/A 09/27/2018   Procedure: AWAKE TRACHEOSTOMY;  Surgeon: [place] Pies, MD;  Location: [facility] OR;  Service: ENT;  Laterality: N/A;    ALLERGIES: No Known Allergies  MEDICATIONS: Current Facility-Administered Medications  Medication Dose Route Frequency Provider Last Rate Last Dose  . chlorhexidine (PERIDEX) 0.12 % solution 15 mL  15 mL Mouth Rinse BID Focht, Jessica L, PA   15 mL at 10/07/18 2100  . dextrose 5 %-0.45 % sodium chloride infusion   Intravenous Continuous Amin, Ankit Chirag, MD 100 mL/hr at 10/08/18 0400    . feeding supplement (OSMOLITE 1.5 CAL) liquid 1,000 mL  1,000 mL Per Tube Continuous Focht, Jessica L, PA 30 mL/hr at 10/08/18 0038    . feeding supplement (PRO-STAT SUGAR FREE 64) liquid 30 mL  30 mL Per Tube BID Focht, Jessica L, PA   30 mL at 10/07/18 1638  . glycopyrrolate (ROBINUL) injection 0.1 mg  0.1 mg Intravenous BID Focht, Jessica L, PA   0.1 mg at 10/07/18 2102  . lactated ringers infusion   Intravenous Continuous Focht, Jessica L, PA 10 mL/hr at 10/07/18 0859    . lidocaine (XYLOCAINE) 2 % jelly 1 application  1 application Urethral Once Teoh, Su, MD      . magnesium sulfate IVPB 2 g 50 mL  2 g Intravenous Once Amin, Ankit Chirag, MD      . MEDLINE mouth rinse  15 mL Mouth Rinse q12n4p Focht, Jessica L, PA   15 mL at 10/07/18 1631  . metoprolol tartrate (LOPRESSOR) injection 5 mg  5 mg Intravenous Q6H Focht, Jessica L, PA   5 mg at 10/08/18 0414  . morphine 2 MG/ML injection 2-4 mg  2-4 mg Intravenous Q2H PRN Focht, Jessica L, PA      .  tamsulosin (FLOMAX) capsule 0.4 mg  0.4 mg Oral Once Jerre Simon, [place]        LABS: Lab Results  Component Value Date   WBC 10.7 (H) 10/03/2018   HGB 12.5 (L) 10/03/2018   HCT 37.6 (L) 10/03/2018   MCV 87.4 10/03/2018   PLT 334 10/03/2018      Component Value Date/Time   NA 136 10/08/2018 0654   NA 145 (H) 04/25/2018 0919   K 4.2 10/08/2018 0654   CL 102 10/08/2018 0654   CO2 27 10/08/2018 0654   GLUCOSE 131 (H) 10/08/2018 0654   BUN 8 10/08/2018 0654   BUN 12 04/25/2018 0919   CREATININE 0.77 10/08/2018 0654   CREATININE 1.08 10/08/2012 1018   CALCIUM 8.6 (L) 10/08/2018 0654   GFRNONAA >60 10/08/2018 0654   GFRNONAA 72 10/08/2012 1018   GFRAA >60  10/08/2018 0654   GFRAA 83 10/08/2012 1018   Lab Results  Component Value Date   INR 1.2 10/03/2018   INR 1.04 03/30/2015   INR 1.02 11/04/2009   No results found for: PTT  SOCIAL HISTORY: Social History   Socioeconomic History  . Marital status: Married    Spouse name: Not on file  . Number of children: Not on file  . Years of education: GED  . Highest education level: GED or equivalent  Occupational History  . Occupation: Retired    Comment: [employer]  Social Needs  . Financial resource strain: Not hard at all  . Food insecurity    Worry: Never true    Inability: Never true  . Transportation needs    Medical: No    Non-medical: No  Tobacco Use  . Smoking status: Former Smoker    Packs/day: 1.00    Years: 40.00    Pack years: 40.00    Types: Cigarettes    Quit date: 10/09/2006    Years since quitting: 12.0  . Smokeless tobacco: Never Used  . Tobacco comment: quit x 9 years  Substance and Sexual Activity  . Alcohol use: Yes    Alcohol/week: 0.0 standard drinks    Comment: 7 oz of vodka per night  . Drug use: No  . Sexual activity: Yes    Birth control/protection: None  Lifestyle  . Physical activity    Days per week: 0 days    Minutes per session: 0 min  . Stress: Only a  little  Relationships  . Social connections    Talks on phone: More than three times a week    Gets together: More than three times a week    Attends religious service: Never    Active member of club or organization: No    Attends meetings of clubs or organizations: Never    Relationship status: Married  . Intimate partner violence    Fear of current or ex partner: No    Emotionally abused: No    Physically abused: No    Forced sexual activity: No  Other Topics Concern  . Not on file  Social History Narrative  . Not on file    FAMILY HISTORY: Family History  Problem Relation Age of Onset  . Cancer Mother   . Hypertension Father   . Hypertension Sister   . Hypertension Brother   . Hypertension Sister   . Aneurysm Brother 32       brain  . Colon cancer Neg Hx   . Colon polyps Neg Hx     REVIEW OF SYSTEMS: Reviewed with the patient as per History of present illness. Psych: Patient denies having dental phobia.  DENTAL HISTORY: CHIEF COMPLAINT: Patient referred by Dr. Dion Body and Dr. Basilio Cairo for a dental consultation.  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  Patient is also status post tracheostomy by Dr. Suszanne Conners.  Patient with anticipated chemoradiation therapy.  Patient now seen as part of a medically necessary pre-chemoradiation therapy dental protocol examination.  The patient currently denies acute toothaches, swellings, or abscesses.  Patient was last seen in January of 2020 to have a maxillary acrylic partial denture inserted.  The treatment was provided by Dr. Peri Jefferson in [place], [place].  Prior to that, the patient was seen for an exam and cleaning in December of 2019.  Patient is usually seen for periodontal maintenance treatments on  an every 39-month basis by patient report.  Patient denies having a lower partial denture.  Patient denies having dental phobia.   DENTAL EXAMINATION: GENERAL: The patient  is a well-developed, well-nourished male sitting in hospital bed.  HEAD AND NECK: Tracheostomy is noted.  I am unable to palpate left and right neck lymphadenopathy at this time. INTRAORAL EXAM: Patient has excessive saliva.  There is no evidence of oral abscess formation. DENTITION: Patient with multiple missing teeth numbers 1-4, 11 -15, 19, 20, 31, and 32.  There is a retained root tip in the area of #16. PERIODONTAL: Patient has chronic periodontitis with plaque and calculus accumulations, gingival recession, and moderate bone loss. There is no significant tooth mobility noted. DENTAL CARIES/SUBOPTIMAL RESTORATIONS:   No obvious dental caries are noted. I would need a full series of dental radiographs to rule out other incipient dental caries. ENDODONTIC: The patient denies acute pulpitis symptoms.  I will need a full series of dental radiographs to rule out incipient periapical pathology. CROWN AND BRIDGE: Crown restorations are noted on tooth numbers 5, 18, and 30. PROSTHODONTIC: Patient has a maxillary acrylic partial denture that was recently fabricated by Dr. Peri Jefferson in January 2020.  I was unable to assess the functionality of the partial denture at this time. OCCLUSION: The patient has a poor occlusal scheme and malocclusion.  RADIOGRAPHIC INTERPRETATION: An orthopantogram was taken today. There are multiple missing teeth.  There is a retained root tip in the area of #16.  There is supra eruption and drifting of the unopposed teeth into the edentulous areas.  Multiple dental restorations are noted.  There are crowns on tooth numbers 5, 18, and 30.  No obvious periapical radiolucencies are noted.   ASSESSMENTS: 1.  Squamous cell carcinoma of the glottis 2.  Pre-chemoradiation therapy dental protocol 3.  Chronic periodontitis with bone loss 4.  Gingival recession 5.  Accretions 6.  Retained root area #16 7.  Multiple missing teeth 8.  Maxillary acrylic partial denture 9.  Supraeruption  and drifting of the unopposed teeth into the edentulous areas 9.  Poor occlusal scheme and malocclusion.   PLAN/RECOMMENDATIONS: 1. I discussed the risks, benefits, and complications of various treatment options with the patient in relationship to his medical and dental conditions, anticipated chemoradiation therapy, and chemoradiation therapy side effects to include xerostomia, radiation caries, trismus, mucositis, taste changes, gum and jawbone changes, and risk for infection and osteoradionecrosis. We discussed various treatment options to include no treatment, extraction of Indicated teeth, alveoloplasty, pre-prosthetic surgery as indicated, periodontal therapy, dental restorations, root canal therapy, crown and bridge therapy, implant therapy, and replacement of missing teeth as indicated.  I will need to schedule the patient for a follow up visit in the Dental Medicine clinic as an outpatient.  I will then obtain a full series dental radiographs and impressions for the fabrication of fluoride trays and scatter protection devices.  I will need to discuss anticipated ports and doses of the future radiation therapy with Dr. Basilio Cairo to determine if there are any teeth in the primary field radiation therapy that need extraction.  I will then discuss the plan of care with the patient to include possible extraction of the retained root in the area of #16.  I will also discuss need for possible referral to an oral surgeon.  I will then prescribe PreviDent 5000 Plus toothpaste for use at bedtime.     2. Discussion of findings with medical team and coordination of future medical and  dental care as needed.      Charlynne Pander, DDS

## 2018-10-08 NOTE — Progress Notes (Signed)
Subjective: No issues overnight. Tolerated tube feed via G-tube.  Objective: Vital signs in last 24 hours: Temp:  [97.9 F (36.6 C)-98.7 F (37.1 C)] 98.6 F (37 C) (07/14 0418) Pulse Rate:  [78-102] 83 (07/14 0740) Resp:  [13-21] 18 (07/14 0423) BP: (103-131)/(65-75) 103/70 (07/14 0423) SpO2:  [93 %-100 %] 95 % (07/14 0740) FiO2 (%):  [28 %-30 %] 28 % (07/14 0740) Weight:  [61.2 kg] 61.2 kg (07/14 0500)  General appearance:alert, cooperative and no distress Head:Normocephalic, without obvious abnormality, atraumatic Eyes:conjunctivae/corneas clear. PERRL, EOM's intact.  Ears:normal TM's and external ear canals both ears Nose:Nares normal. Septum midline. Mucosa normal. No drainage or sinus tenderness. Throat:lips, mucosa, and tongue normal; Neck:Trach midline. No bleeding. Neurologic:Grossly normal  No results for input(s): WBC, HGB, HCT, PLT in the last 72 hours. Recent Labs    10/07/18 0447 10/08/18 0654  NA 135 136  K 5.1 4.2  CL 100 102  CO2 28 27  GLUCOSE 120* 131*  BUN 7* 8  CREATININE 0.77 0.77  CALCIUM 8.8* 8.6*    Medications:  I have reviewed the patient's current medications. Scheduled: . chlorhexidine  15 mL Mouth Rinse BID  . feeding supplement (PRO-STAT SUGAR FREE 64)  30 mL Per Tube BID  . lidocaine  1 application Urethral Once  . mouth rinse  15 mL Mouth Rinse q12n4p  . metoprolol tartrate  5 mg Intravenous Q6H  . tamsulosin  0.4 mg Oral Once   Continuous: . dextrose 5 % and 0.45% NaCl 100 mL/hr at 10/08/18 0400  . feeding supplement (OSMOLITE 1.5 CAL) 30 mL/hr at 10/08/18 0038  . lactated ringers 10 mL/hr at 10/07/18 0859  . magnesium sulfate bolus IVPB      Assessment/Plan:  Laryngeal SCCA, s/p trach and G-tube placement -Neck CT shows extensive laryngeal/pharyngeal/esophagealSCCA. - Now has anuncuffed#[id number]. - Will start flomaxand low-dose lopressoronce the G-tube is in place. - Will start metoprolol 12.5mg  BID and  Flomax via G-tube. Lurline Idol and G-tube care teaching in anticipation of discharge.   LOS: 11 days   Arden Tinoco W Essence Merle 10/08/2018, 10:21 AM

## 2018-10-08 NOTE — Progress Notes (Signed)
PROGRESS NOTE    Logan Price  WUJ:[id number] DOB: November 23, 1945 DOA: 09/27/2018 PCP: [name], FNP   Brief Narrative:  73 year old with history of GERD, essential hypertension, hyperlipidemia who recently diagnosed neck mass concerning for malignancy was admitted to the hospital.  Overnight patient went into SVT therefore medical team was consulted.  Patient was given adenosine x1 which converted back to normal sinus rhythm.  G-tube placed by surgery 7/13, tolerating tube feeds.  Metoprolol 12.5 mg twice daily started   Assessment & Plan:   Principal Problem:   Squamous cell carcinoma of glottis (HCC) Active Problems:   Laryngeal mass   Goals of care, counseling/discussion   Dysphagia   Loss of weight   PSVT (paroxysmal supraventricular tachycardia) (HCC)   Protein-calorie malnutrition, severe   Paroxysmal supraventricular tachycardia; Stable -Unknown exact etiology.  So narrow complex tachycardia with regular rate.  Received 1 dose of adenosine, now in normal sinus rhythm with heart rate in 90s. -TSH- Normal. Echo normal.  -Replete Lytes. Keep K >4.0, Mg >2. Mg repletion ordered.  -Doing well, started on metoprolol 12.5 mg twice daily via G-tube.  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  Status post G-tube placement 7/13.  Start feeding once cleared by surgery. -Robinul seems to be helping with secretions. -Nutrition team is following.  Subjective: Does not have any complaints.  Review of Systems Otherwise negative except as per HPI, including: General = no fevers, chills, dizziness, malaise, fatigue HEENT/EYES = negative for pain, redness, loss of vision, double vision, blurred vision, loss of hearing, sore throat, hoarseness, dysphagia Cardiovascular= negative for chest pain, palpitation, murmurs, lower extremity swelling Respiratory/lungs= negative for shortness of breath, cough,  hemoptysis, wheezing, mucus production Gastrointestinal= negative for nausea, vomiting,, abdominal pain, melena, hematemesis Genitourinary= negative for Dysuria, Hematuria, Change in Urinary Frequency MSK = Negative for arthralgia, myalgias, Back Pain, Joint swelling  Neurology= Negative for headache, seizures, numbness, tingling  Psychiatry= Negative for anxiety, depression, suicidal and homocidal ideation Allergy/Immunology= Medication/Food allergy as listed  Skin= Negative for Rash, lesions, ulcers, itching   Objective: Vitals:   10/08/18 0418 10/08/18 0423 10/08/18 0500 10/08/18 0740  BP: 103/70 103/70    Pulse: 98 78  83  Resp: 19 18    Temp: 98.6 F (37 C)     TempSrc: Oral     SpO2: 93% 94%  95%  Weight:   61.2 kg   Height:        Intake/Output Summary (Last 24 hours) at 10/08/2018 1100 Last data filed at 10/08/2018 0400 Gross per 24 hour  Intake 1522.15 ml  Output 900 ml  Net 622.15 ml   Filed Weights   10/06/18 0638 10/07/18 0522 10/08/18 0500  Weight: 57.8 kg 60.1 kg 61.2 kg    Examination: Constitutional: NAD, calm, comfortable, trach in place Eyes: PERRL, lids and conjunctivae normal ENMT: Mucous membranes are moist. Posterior pharynx clear of any exudate or lesions.Normal dentition.  Neck: normal, supple, no masses, no thyromegaly Respiratory: clear to auscultation bilaterally, no wheezing, no crackles. Normal respiratory effort. No accessory muscle use.  Cardiovascular: Regular rate and rhythm, no murmurs / rubs / gallops. No extremity edema. 2+ pedal pulses. No carotid bruits.  Abdomen: no tenderness, no masses palpated. No hepatosplenomegaly. Bowel sounds positive. G tube in place.  Musculoskeletal: no clubbing / cyanosis. No joint deformity upper and lower extremities. Good ROM, no contractures. Normal muscle tone.  Skin: no rashes, lesions, ulcers.  No induration Neurologic: CN 2-12 grossly intact. Sensation intact, DTR normal. Strength 5/5 in all 4.   Psychiatric: Normal judgment and insight. Alert and oriented x 3. Normal mood.     Coud catheter in place  Data Reviewed:   CBC: Recent Labs  Lab 10/03/18 0400  WBC 10.7*  HGB 12.5*  HCT 37.6*  MCV 87.4  PLT 334   Basic Metabolic Panel: Recent Labs  Lab 10/04/18 0250 10/05/18 0312 10/05/18 1317 10/05/18 1643 10/06/18 0340 10/06/18 1648 10/07/18 0447 10/07/18 1621 10/08/18 0654  NA 136 135  --   --  136  --  135  --  136  K 3.8 4.5  --   --  4.7  --  5.1  --  4.2  CL 104 103  --   --  102  --  100  --  102  CO2 23 23  --   --  24  --  28  --  27  GLUCOSE 118* 117*  --   --  114*  --  120*  --  131*  BUN <5* <5*  --   --  5*  --  7*  --  8  CREATININE 0.73 0.87  --   --  0.70  --  0.77  --  0.77  CALCIUM 8.4* 8.4*  --   --  9.0  --  8.8*  --  8.6*  MG 1.8 1.8  --  2.0 1.8 1.7 1.8 1.6* 1.6*  PHOS  --   --  3.5 3.1 3.8 4.1 4.1  --   --    GFR: Estimated Creatinine Clearance: 73.3 mL/min (by C-G formula based on SCr of 0.77 mg/dL). Liver Function Tests: No results for input(s): AST, ALT, ALKPHOS, BILITOT, PROT, ALBUMIN in the last 168 hours. No results for input(s): LIPASE, AMYLASE in the last 168 hours. No results for input(s): AMMONIA in the last 168 hours. Coagulation Profile: Recent Labs  Lab 10/03/18 0400  INR 1.2   Cardiac Enzymes: No results for input(s): CKTOTAL, CKMB, CKMBINDEX, TROPONINI in the last 168 hours. BNP (last 3 results) No results for input(s): PROBNP in the last 8760 hours. HbA1C: No results for input(s): HGBA1C in the last 72 hours. CBG: Recent Labs  Lab 10/07/18 1633 10/07/18 2038 10/08/18 0038 10/08/18 0422 10/08/18 0828  GLUCAP 170* 143* 137* 134* 133*   Lipid Profile: No results for input(s): CHOL, HDL, LDLCALC, TRIG, CHOLHDL, LDLDIRECT in the last 72 hours. Thyroid Function Tests: No results for input(s): TSH, T4TOTAL, FREET4, T3FREE, THYROIDAB in the last 72 hours. Anemia Panel: No results for input(s): VITAMINB12,  FOLATE, FERRITIN, TIBC, IRON, RETICCTPCT in the last 72 hours. Sepsis Labs: No results for input(s): PROCALCITON, LATICACIDVEN in the last 168 hours.  Recent Results (from the past 240 hour(s))  SARS Coronavirus 2 (CEPHEID - Performed in [facility] hospital lab), Hosp Order     Status: None   Collection Time: 10/02/18 11:06 AM   Specimen: Nasopharyngeal Swab  Result Value Ref Range Status   SARS Coronavirus 2 NEGATIVE NEGATIVE Final    Comment: (NOTE) If result is NEGATIVE SARS-CoV-2 target nucleic acids are NOT DETECTED. The SARS-CoV-2 RNA is generally detectable in upper and lower  respiratory specimens during the acute phase of infection. The lowest  concentration of SARS-CoV-2 viral copies this assay can detect is 250  copies / mL. A negative result does not preclude SARS-CoV-2 infection  and should not be used as the sole basis for  treatment or other  patient management decisions.  A negative result may occur with  improper specimen collection / handling, submission of specimen other  than nasopharyngeal swab, presence of viral mutation(s) within the  areas targeted by this assay, and inadequate number of viral copies  (<250 copies / mL). A negative result must be combined with clinical  observations, patient history, and epidemiological information. If result is POSITIVE SARS-CoV-2 target nucleic acids are DETECTED. The SARS-CoV-2 RNA is generally detectable in upper and lower  respiratory specimens dur ing the acute phase of infection.  Positive  results are indicative of active infection with SARS-CoV-2.  Clinical  correlation with patient history and other diagnostic information is  necessary to determine patient infection status.  Positive results do  not rule out bacterial infection or co-infection with other viruses. If result is PRESUMPTIVE POSTIVE SARS-CoV-2 nucleic acids MAY BE PRESENT.   A presumptive positive result was obtained on the submitted specimen  and  confirmed on repeat testing.  While 2019 novel coronavirus  (SARS-CoV-2) nucleic acids may be present in the submitted sample  additional confirmatory testing may be necessary for epidemiological  and / or clinical management purposes  to differentiate between  SARS-CoV-2 and other Sarbecovirus currently known to infect humans.  If clinically indicated additional testing with an alternate test  methodology [phone]) is advised. The SARS-CoV-2 RNA is generally  detectable in upper and lower respiratory sp ecimens during the acute  phase of infection. The expected result is Negative. Fact Sheet for Patients:  BoilerBrush.com.cy Fact Sheet for Healthcare Providers: https://pope.com/ This test is not yet approved or cleared by the [place] FDA and has been authorized for detection and/or diagnosis of SARS-CoV-2 by FDA under an Emergency Use Authorization (EUA).  This EUA will remain in effect (meaning this test can be used) for the duration of the COVID-19 declaration under Section 564(b)(1) of the Act, 21 U.S.C. section 360bbb-3(b)(1), unless the authorization is terminated or revoked sooner. Performed at [facility], [address], [place], [place] [address]          Radiology Studies: No results found.      Scheduled Meds: . chlorhexidine  15 mL Mouth Rinse BID  . doxazosin  1 mg Per Tube Daily  . feeding supplement (PRO-STAT SUGAR FREE 64)  30 mL Per Tube BID  . lidocaine  1 application Urethral Once  . mouth rinse  15 mL Mouth Rinse q12n4p  . metoprolol tartrate  12.5 mg Per Tube BID   Continuous Infusions: . dextrose 5 % and 0.45% NaCl 100 mL/hr at 10/08/18 0400  . feeding supplement (OSMOLITE 1.5 CAL) 30 mL/hr at 10/08/18 0038  . lactated ringers 10 mL/hr at 10/07/18 0859  . magnesium sulfate bolus IVPB       LOS: 11 days   Time spent= 25 mins    Ankit Joline Maxcy, MD [facility]  If  7PM-7AM, please contact night-coverage [email or web address] 10/08/2018, 11:00 AM

## 2018-10-08 NOTE — Care Management Important Message (Signed)
Important Message  Patient Details  Name: Logan Price MRN: [id number] Date of Birth: 1945/04/21   Medicare Important Message Given:  Yes     Stclair, Szymborski 10/08/2018, 2:07 PM

## 2018-10-09 ENCOUNTER — Telehealth: Payer: Self-pay | Admitting: *Deleted

## 2018-10-09 DIAGNOSIS — R1312 Dysphagia, oropharyngeal phase: Secondary | ICD-10-CM

## 2018-10-09 DIAGNOSIS — C320 Malignant neoplasm of glottis: Secondary | ICD-10-CM

## 2018-10-09 LAB — GLUCOSE, CAPILLARY
Glucose-Capillary: 130 mg/dL — ABNORMAL HIGH (ref 70–99)
Glucose-Capillary: 134 mg/dL — ABNORMAL HIGH (ref 70–99)
Glucose-Capillary: 139 mg/dL — ABNORMAL HIGH (ref 70–99)
Glucose-Capillary: 140 mg/dL — ABNORMAL HIGH (ref 70–99)
Glucose-Capillary: 161 mg/dL — ABNORMAL HIGH (ref 70–99)
Glucose-Capillary: 96 mg/dL (ref 70–99)

## 2018-10-09 LAB — BASIC METABOLIC PANEL
Anion gap: 9 (ref 5–15)
BUN: 10 mg/dL (ref 8–23)
CO2: 26 mmol/L (ref 22–32)
Calcium: 8.8 mg/dL — ABNORMAL LOW (ref 8.9–10.3)
Chloride: 103 mmol/L (ref 98–111)
Creatinine, Ser: 0.67 mg/dL (ref 0.61–1.24)
GFR calc Af Amer: >60 mL/min (ref >60)
GFR calc non Af Amer: >60 mL/min (ref >60)
Glucose, Bld: 114 mg/dL — ABNORMAL HIGH (ref 70–99)
Potassium: 4.6 mmol/L (ref 3.5–5.1)
Sodium: 138 mmol/L (ref 135–145)

## 2018-10-09 LAB — PHOSPHORUS: Phosphorus: 4.1 mg/dL (ref 2.5–4.6)

## 2018-10-09 LAB — MAGNESIUM: Magnesium: 1.8 mg/dL (ref 1.7–2.4)

## 2018-10-09 MED ORDER — HYDROCORTISONE (PERIANAL) 2.5 % EX CREA
TOPICAL_CREAM | Freq: Four times a day (QID) | CUTANEOUS | Status: DC
  Administered 2018-10-09 – 2018-10-14 (×17): via RECTAL
  Administered 2018-10-14: 1 via RECTAL
  Administered 2018-10-15 (×3): via RECTAL
  Administered 2018-10-16: 1 via RECTAL
  Administered 2018-10-16 – 2018-10-21 (×7): via RECTAL
  Administered 2018-10-25: 1 via RECTAL
  Administered 2018-10-26: 12:00:00 via RECTAL
  Administered 2018-10-26: 1 via RECTAL
  Administered 2018-10-27 (×2): via RECTAL
  Administered 2018-10-27: 1 via RECTAL
  Administered 2018-10-27 – 2018-10-31 (×4): via RECTAL
  Administered 2018-11-02 – 2018-11-03 (×2): 1 via RECTAL
  Administered 2018-11-04: 23:00:00 via RECTAL
  Administered 2018-11-04 (×2): 1 via RECTAL
  Administered 2018-11-05 – 2018-11-07 (×6): via RECTAL
  Filled 2018-10-09 (×2): qty 28.35

## 2018-10-09 MED ORDER — METOPROLOL TARTRATE 25 MG/10 ML ORAL SUSPENSION: 12.5000 mg | Freq: Two times a day (BID) | ORAL | 5 refills | Status: DC

## 2018-10-09 MED ORDER — DOXAZOSIN MESYLATE 1 MG PO TABS: 1.0000 mg | ORAL_TABLET | Freq: Every day | ORAL | 5 refills | Status: AC

## 2018-10-09 MED ORDER — HYDROCODONE-ACETAMINOPHEN 7.5-325 MG/15ML PO SOLN: 15.0000 mL | Freq: Four times a day (QID) | ORAL | 0 refills | Status: DC | PRN

## 2018-10-09 MED ORDER — OSMOLITE 1.5 CAL PO LIQD: 1000.0000 mL | ORAL | 15 refills | Status: DC

## 2018-10-09 MED ORDER — PRO-STAT SUGAR FREE PO LIQD: 30.0000 mL | Freq: Two times a day (BID) | ORAL | 15 refills | Status: DC

## 2018-10-09 NOTE — Discharge Summary (Signed)
Physician Discharge Summary  Patient ID: Logan Price MRN: [id number] DOB/AGE: 72-Apr-1947 72 y.o.  Admit date: 09/27/2018 Discharge date: 10/09/2018  Admission Diagnoses: Laryngeal cancer  Discharge Diagnoses: Laryngeal Cancer Principal Problem:   Squamous cell carcinoma of glottis (HCC) Active Problems:   Laryngeal mass   Goals of care, counseling/discussion   Dysphagia   Loss of weight   PSVT (paroxysmal supraventricular tachycardia) (HCC)   Protein-calorie malnutrition, severe   Discharged Condition: fair  Hospital Course: Pt underwent trach and biopsy on the day of admission. Path c/w SCCA.  Pt was seen by oncology. Port placed by [employer]. A G-tube was placed by General surgery. Pt developed PSVT, seen by hospitalist, his rhythm was converted medically. He was started on metoprolol 12.5mg  BID. Pt also has difficulty urinating. He was placed on doxazosin (flomax could not be used with g-tube). Pt is scheduled to have a PET scan. Will start radiation treatment with Dr. Basilio Cairo.  Consults: Hospitalist General surgery OT/PT Nutritionist SLP IR Oncology Dental  Treatments: surgery: Trach, biopsy, G-tube, port.  Discharge Exam: Blood pressure (!) 161/87, pulse (!) 107, temperature (!) 97.4 F (36.3 C), temperature source Oral, resp. rate (!) 22, height 5\' 9"  (1.753 m), weight 60.8 kg, SpO2 100 %. General appearance:alert, cooperative and no distress Head:Normocephalic, without obvious abnormality, atraumatic Eyes:conjunctivae/corneas clear. PERRL, EOM's intact.  Ears:normal TM's and external ear canals both ears Nose:Nares normal. Septum midline. Mucosa normal. No drainage or sinus tenderness. Throat:lips, mucosa, and tongue normal; Neck:Trach midline. No bleeding. Abdomen: G-tube in place. Neurologic:Grossly normal  Disposition: Discharge disposition: 01-Home or Self Care       Discharge Instructions    Activity as tolerated - No restrictions   Complete by: As  directed    Diet general   Complete by: As directed      Allergies as of 10/09/2018   No Known Allergies     Medication List    STOP taking these medications   amLODipine 10 MG tablet Commonly known as: NORVASC   aspirin EC 81 MG tablet   multivitamin with minerals Tabs tablet     TAKE these medications   doxazosin 1 MG tablet Commonly known as: CARDURA Place 1 tablet (1 mg total) into feeding tube daily. Start taking on: October 10, 2018   feeding supplement (OSMOLITE 1.5 CAL) Liqd Place 1,000 mLs into feeding tube continuous.   feeding supplement (PRO-STAT SUGAR FREE 64) Liqd Place 30 mLs into feeding tube 2 (two) times daily.   fluticasone 50 MCG/ACT nasal spray Commonly known as: FLONASE SPRAY 2 SPRAYS INTO EACH NOSTRIL EVERY DAY What changed: See the new instructions.   HYDROcodone-acetaminophen 7.5-325 mg/15 ml solution Commonly known as: HYCET Take 15 mLs by mouth every 6 (six) hours as needed for up to 5 days for moderate pain or severe pain.   metoprolol tartrate 25 mg/10 mL Susp Commonly known as: LOPRESSOR Place 5 mLs (12.5 mg total) into feeding tube 2 (two) times daily.   pantoprazole 40 MG tablet Commonly known as: Protonix 1 po 30 mins prior to first meal What changed:   how much to take  how to take this  when to take this   simvastatin 20 MG tablet Commonly known as: ZOCOR TAKE 1 TABLET AT BEDTIME   Vitamin D 50 MCG (2000 UT) Caps Take 2,000 Units by mouth daily.            Durable Medical Equipment  (From admission, onward)  Start     Ordered   09/30/18 1030  For home use only DME Trach supplies  Once     09/30/18 1029         Follow-up Information    [facility] Follow [facility].   Specialty: Home Health Services Why: For home health services- RN/PT/OT/aide/Resp. care- they will call you to set up home visits       Kinsinger, De Blanch, MD Follow up in 4 week(s).   Specialty: General  Surgery Contact information: [address] [place] [address] [phone]        Charlynne Pander, DDS Follow up.   Specialty: Dentistry Why: Call for follow-up appointment with Dental Medicine once he is discharged. Contact information: [address] [place] [address] [phone]        [facility] Follow up.   Why: Trach supplies and tube feedings arranged Contact information: [address] [place] [address] [phone]        Newman Pies, MD Follow up on 10/17/2018.   Specialty: Otolaryngology Why: at 1:50pm Contact information: [address] [place] [address] [phone]           Signed: Karn Pickler 10/09/2018, 5:11 PM

## 2018-10-09 NOTE — Discharge Instructions (Signed)
Gastrostomy Tube Home Guide, Adult A gastrostomy tube, or G-tube, is a tube that is inserted through the abdomen into the stomach. The tube is used to give feedings and medicines when a person is unable to eat and drink enough on his or her own. How to care for a G-tube Supplies needed  Saline solution or clean, warm water and soap.  Cotton swab or gauze.  Precut gauze bandage (dressing) and tape, if needed. Instructions 1. Wash your hands with soap and water. 2. If there is a dressing between the person's skin and the tube, remove it. 3. Check the area where the tube enters the skin. Check for problems such as: ? Redness. ? Swelling. ? Pus-like drainage. ? Extra skin growth. 4. Moisten the cotton swab with the saline solution or soap and water mixture. Gently clean around the insertion site. Remove any drainage or crusting. ? When the G-tube is first put in, a normal saline solution or water can be used to clean the skin. ? Mild soap and warm water can be used when the skin around the G-tube site has healed. 5. If there should be a dressing between the person's skin and the tube, apply it at this time. How to flush a G-tube Flush the G-tube regularly to keep it from clogging. Flush it before and after feedings and as often as told by the health care provider. Supplies needed  Purified or sterile water, warmed. If the person has a weak disease-fighting (immune) system, or if he or she has difficulty fighting off infections (is immunocompromised), use only sterile water. ? If you are unsure about the amount of chemical contaminants in purified or drinking water, use sterile water. ? To purify drinking water by boiling:  Boil water for at least 1 minute. Keep lid over water while it boils. Allow water to cool to room temperature before using.  60cc G-tube syringe. Instructions 1. Wash your hands with soap and water. 2. Draw up 30 mL of warm water in a syringe. 3. Connect the syringe  to the tube. 4. Slowly and gently push the water into the tube. G-tube problems and solutions  If the tube comes out: ? Cover the opening with a clean dressing and tape. ? Call a health care provider right away. ? A health care provider will need to put the tube back in within 4 hours.  If there is skin or scar tissue growing where the tube enters the skin: ? Keep the area clean and dry. ? Secure the tube with tape so that the tube does not move around too much. ? Call a health care provider.  If the tube gets clogged: ? Slowly push warm water into the tube with a large syringe. ? Do not force the fluid into the tube or push an object into the tube. ? If you are not able to unclog the tube, call a health care provider right away. Follow these instructions at home: Feedings  Give feedings at room temperature.  Cover and place unused feedings in the refrigerator.  If feedings are continuous: ? Do not put more than 4 hours worth of feedings in the feeding bag. ? Stop the feedings when you need to give medicine or flush the tube. Be sure to restart the feedings. ? Make sure the person's head is above his or her stomach (upright position). This will prevent choking and discomfort.  Replace feeding bags and syringes as told by the health care provider.  Make  sure the person is in the right position during and after feedings: ? During feedings, the person's position should be in the upright position. ? After a noncontinuous feeding (bolus feeding), have the person stay in the upright position for 1 hour. General instructions  Only use syringes made for G-tubes.  Do not pull or put tension on the tube.  Clamp the tube before removing the cap or disconnecting a syringe.  Measure the length of the G-tube every day from the insertion site to the end of the tube.  If the person's G-tube has a balloon, check the fluid in the balloon every week. The amount of fluid that should be in  the balloon can be found in the manufacturers specifications.  Make sure the person takes care of his or her oral health, such as by brushing his or her teeth.  Remove excess air from the G-tube as told by the person's health care provider. This is called "venting."  Keep the area where the tube enters the skin clean and dry.  Do not push feedings, medicines, or flushes rapidly. Contact a health care provider if:  The person with the tube has any of these problems: ? Constipation. ? Fever.  There is a large amount of fluid or mucus-like liquid leaking from the tube.  Skin or scar tissue appears to be growing where the tube enters the skin.  The length of tube from the insertion site to the G-tube gets longer. Get help right away if:  The person with the tube has any of these problems: ? Severe abdominal pain. ? Severe tenderness. ? Severe bloating. ? Nausea. ? Vomiting. ? Trouble breathing. ? Shortness of breath.  Any of these problems happen in the area where the tube enters the skin: ? Redness, irritation, swelling, or soreness. ? Pus-like discharge. ? A bad smell.  The tube is clogged and cannot be flushed.  The tube comes out. Summary  A gastrostomy tube, or G-tube, is a tube that is inserted through the abdomen into the stomach. The tube is used to give feedings and medicines when a person is unable to eat and drink enough on his or her own.  Check and clean the insertion site daily as told by the person's health care provider.  Flush the G-tube regularly to keep it from clogging. Flush it before and after feedings and as often as told by the person's health care provider.  Keep the area where the tube enters the skin clean and dry. This information is not intended to replace advice given to you by your health care provider. Make sure you discuss any questions you have with your health care provider. Document Released: 05/22/2001 Document Revised: 02/23/2017  Document Reviewed: 05/08/2016 Elsevier Patient Education  [employer].  -------------   Tracheostomy, Care After This sheet gives you information about how to care for yourself after your procedure. Your health care provider may also give you more specific instructions. If you have problems or questions, contact your health care provider. What can I expect after the procedure? After the procedure, it is common to have:  Crusting and slight bleeding around the tracheostomy opening.  Inability to speak with your normal voice.  Increased coughing with mucus.  Limited sense of smell and taste. Follow these instructions at home: Tracheostomy care  Wash your hands with soap and water before touching your dressing or any part of your tracheostomy.  Make sure that you and your caregivers know how to: ?  Clean and replace your breathing tube (tracheostomy tube). ? Keep your tracheostomy opening free of mucus. ? Suction your windpipe (trachea). ? Care for the skin around your tracheostomy opening.  Protect your trachea and lungs from dry air. To do this: ? Cover the opening of your tracheostomy tube with moist gauze or a rubber collar (stoma bib or shower shield), as recommended by your health care provider. ? Use a humidifier in your home to add moisture to the air.  When you change the tracheostomy holder around your neck, make sure that you can fit two fingers between the holder and your neck.  Always have supplies available for tracheostomy care. Eating and drinking   Follow instructions from your health care provider about any eating or drinking restrictions.  Try adding spices and herbs to foods to improve flavor.  Drink enough fluids to keep your urine pale yellow and to keep your mucus moist. Activity  Avoid activities that require a lot of energy (strenuous activities) for 2 weeks, or as long as told by your health care provider. Ask your health care provider what  activities are safe for you and when you may return to your normal activities.  Do not drive for 24 hours if you were given a medicine to help you relax (sedative) during your procedure. General instructions  Keep water out of your tracheostomy tube. ? When you take a bath or shower, cover your tracheostomy tube with a shower shield, as told by your health care provider. ? Do not swim.  Work with a Astronomer to find the best way for you to communicate. There are many options for communicating with a tracheostomy.  Do not use any products that contain nicotine or tobacco, such as cigarettes and e-cigarettes. These can delay incision healing and make it harder to breathe. If you need help quitting, ask your health care provider.  Take over-the-counter and prescription medicines only as told by your health care provider.  Wear a medical alert bracelet or necklace that says that you have a tracheostomy.  Keep all follow-up visits as told by your health care provider. This is important. Contact a health care provider if you have:  A fever or chills.  A cough that does not go away and produces more mucus.  Increased crusting, bleeding, or irritation around your tracheostomy opening.  Mucus that smells bad or is discolored.  Questions about how to care for your tracheostomy. Get help right away if:  You have trouble breathing.  Your tracheal tube comes out and you cannot replace it.  You have persistent bleeding or unusual drainage from your tracheostomy opening.  You start to choke after eating. Summary  After a tracheostomy, it is common to have crusting and slight bleeding around the tracheostomy opening.  Make sure that you and your caregivers know how to clean and replace your breathing tube (tracheostomy tube).  Keep water out of your tracheostomy tube. When you take a bath or shower, cover your tracheostomy tube with a shower shield, as told by your health care  provider.  Get help right away if you have trouble breathing. This information is not intended to replace advice given to you by your health care provider. Make sure you discuss any questions you have with your health care provider. Document Released: 10/08/2013 Document Revised: 02/23/2017 Document Reviewed: 02/08/2017 Elsevier Patient Education  2020 [employer].

## 2018-10-09 NOTE — Telephone Encounter (Signed)
Oncology Nurse Navigator Documentation  Placed introductory call to new referral patient pt's wife, also spoke with his brother.  Introduced myself as the H&N oncology nurse navigator that works with Dr. Isidore Moos to whom he has been referred, briefly explained my role as their navigator, provided my contact information.   Explained:  Recommendation from this morning's ENT Conference is for him to receive radiation tmt, explained what this will entail.  I will be coordinating PET upon his DC and Consult/CT SIM appts with Dr. Isidore Moos after PET completed. Brother indicated plan is for pt to be DC'd today. I encouraged them to call me with questions as pt moves forward with appts.  Gayleen Orem, RN, BSN Head & Neck Oncology Nurse [facility] [place] [phone]

## 2018-10-09 NOTE — Progress Notes (Signed)
OT Cancellation Note  Patient Details Name: Logan Price MRN: [id number] DOB: Jan 30, 1946   Cancelled Treatment:    Reason Eval/Treat Not Completed: Other (comment). Pt received on [facility], states he has been having issues with his bowels since 6am. Declines OT session at this time. Plan to reattempt as schedule permits.   Tyrone Schimke, OT Acute Rehabilitation Services Pager: [phone] Office: [phone]  10/09/2018, 12:04 PM

## 2018-10-09 NOTE — Progress Notes (Signed)
PROGRESS NOTE  Logan Price FIE:[id number] DOB: 1945/06/09 DOA: 09/27/2018 PCP: [name], FNP  Consulting Physician: Dr. Janeece Riggers, ENT  Reason for consult: Medical management.  HPI/Recap of past 49 hours: 73 year old with history of GERD, essential hypertension, hyperlipidemia who recently diagnosed neck mass concerning for malignancy was admitted to the hospital.  Overnight patient went into SVT therefore medical team was consulted.  Patient was given adenosine x1 which converted back to normal sinus rhythm.  G-tube placed by surgery 7/13, tolerating tube feeds.  Metoprolol 12.5 mg twice daily started.  10/09/18: Patient was seen and examined at his bedside this morning.  He denies any palpitations, chest pain, or dyspnea at rest.      Assessment/Plan: Principal Problem:   Squamous cell carcinoma of glottis (HCC) Active Problems:   Laryngeal mass   Goals of care, counseling/discussion   Dysphagia   Loss of weight   PSVT (paroxysmal supraventricular tachycardia) (HCC)   Protein-calorie malnutrition, severe  Paroxysmal supraventricular tachycardia Reviewed vital signs and they are stable at this time Electrolytes are normal TSH is normal Continue metoprolol 12.5 mg twice daily via G-tube.  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  Status post G-tube placement on 10/07/2018 Continue Robinul for secretions Nutritionist following.    Thank you for allowing [place] to participate in the care of this patient.  [facility] will follow with you.  Objective: Vitals:   10/08/18 2231 10/09/18 0342 10/09/18 0410 10/09/18 0830  BP:   120/67   Pulse: (!) 108  98 90  Resp:   19 20  Temp:   97.9 F (36.6 C)   TempSrc:   Oral   SpO2:  94% 96% 94%  Weight:   60.8 kg   Height:        Intake/Output Summary (Last 24 hours) at 10/09/2018 1133 Last data filed at 10/09/2018 0600 Gross per 24 hour  Intake 2729.16 ml  Output 3100 ml  Net -370.84 ml    Filed Weights   10/07/18 0522 10/08/18 0500 10/09/18 0410  Weight: 60.1 kg 61.2 kg 60.8 kg    Exam:  . General: 73 y.o. year-old male Thin built in no acute distress.  Alert and oriented x3. . Cardiovascular: Regular rate and rhythm with no rubs or gallops.  No thyromegaly or JVD noted.   Marland Kitchen Respiratory: Trach collar in place.  Clear to auscultation with no wheezes or rales. Good inspiratory effort. . Abdomen: Soft nontender nondistended with normal bowel sounds x4 quadrants.  G-tube in place. . Musculoskeletal: No lower extremity edema. 2/4 pulses in all 4 extremities. Marland Kitchen Psychiatry: Mood is appropriate for condition and setting   Data Reviewed: CBC: Recent Labs  Lab 10/03/18 0400  WBC 10.7*  HGB 12.5*  HCT 37.6*  MCV 87.4  PLT 334   Basic Metabolic Panel: Recent Labs  Lab 10/05/18 0312  10/05/18 1643 10/06/18 0340 10/06/18 1648 10/07/18 0447 10/07/18 1621 10/08/18 0654 10/09/18 0359  NA 135  --   --  136  --  135  --  136 138  K 4.5  --   --  4.7  --  5.1  --  4.2 4.6  CL 103  --   --  102  --  100  --  102 103  CO2 23  --   --  24  --  28  --  27 26  GLUCOSE 117*  --   --  114*  --  120*  --  131* 114*  BUN <5*  --   --  5*  --  7*  --  8 10  CREATININE 0.87  --   --  0.70  --  0.77  --  0.77 0.67  CALCIUM 8.4*  --   --  9.0  --  8.8*  --  8.6* 8.8*  MG 1.8  --  2.0 1.8 1.7 1.8 1.6* 1.6* 1.8  PHOS  --    < > 3.1 3.8 4.1 4.1  --   --  4.1   < > = values in this interval not displayed.   GFR: Estimated Creatinine Clearance: 72.8 mL/min (by C-G formula based on SCr of 0.67 mg/dL). Liver Function Tests: No results for input(s): AST, ALT, ALKPHOS, BILITOT, PROT, ALBUMIN in the last 168 hours. No results for input(s): LIPASE, AMYLASE in the last 168 hours. No results for input(s): AMMONIA in the last 168 hours. Coagulation Profile: Recent Labs  Lab 10/03/18 0400  INR 1.2   Cardiac Enzymes: No results for input(s): CKTOTAL, CKMB, CKMBINDEX, TROPONINI in the  last 168 hours. BNP (last 3 results) No results for input(s): PROBNP in the last 8760 hours. HbA1C: No results for input(s): HGBA1C in the last 72 hours. CBG: Recent Labs  Lab 10/08/18 1614 10/08/18 1953 10/09/18 0043 10/09/18 0408 10/09/18 0821  GLUCAP 119* 135* 130* 96 134*   Lipid Profile: No results for input(s): CHOL, HDL, LDLCALC, TRIG, CHOLHDL, LDLDIRECT in the last 72 hours. Thyroid Function Tests: No results for input(s): TSH, T4TOTAL, FREET4, T3FREE, THYROIDAB in the last 72 hours. Anemia Panel: No results for input(s): VITAMINB12, FOLATE, FERRITIN, TIBC, IRON, RETICCTPCT in the last 72 hours. Urine analysis:    Component Value Date/Time   COLORURINE YELLOW 03/30/2015 1330   APPEARANCEUR CLEAR 03/30/2015 1330   LABSPEC >1.030 (H) 03/30/2015 1330   PHURINE 5.5 03/30/2015 1330   GLUCOSEU NEGATIVE 03/30/2015 1330   HGBUR TRACE (A) 03/30/2015 1330   BILIRUBINUR NEGATIVE 03/30/2015 1330   KETONESUR NEGATIVE 03/30/2015 1330   PROTEINUR NEGATIVE 03/30/2015 1330   NITRITE NEGATIVE 03/30/2015 1330   LEUKOCYTESUR NEGATIVE 03/30/2015 1330   Sepsis Labs: @LABRCNTIP (procalcitonin:4,lacticidven:4)  ) Recent Results (from the past 240 hour(s))  SARS Coronavirus 2 (CEPHEID - Performed in [facility] hospital lab), Hosp Order     Status: None   Collection Time: 10/02/18 11:06 AM   Specimen: Nasopharyngeal Swab  Result Value Ref Range Status   SARS Coronavirus 2 NEGATIVE NEGATIVE Final    Comment: (NOTE) If result is NEGATIVE SARS-CoV-2 target nucleic acids are NOT DETECTED. The SARS-CoV-2 RNA is generally detectable in upper and lower  respiratory specimens during the acute phase of infection. The lowest  concentration of SARS-CoV-2 viral copies this assay can detect is 250  copies / mL. A negative result does not preclude SARS-CoV-2 infection  and should not be used as the sole basis for treatment or other  patient management decisions.  A negative result may occur  with  improper specimen collection / handling, submission of specimen other  than nasopharyngeal swab, presence of viral mutation(s) within the  areas targeted by this assay, and inadequate number of viral copies  (<250 copies / mL). A negative result must be combined with clinical  observations, patient history, and epidemiological information. If result is POSITIVE SARS-CoV-2 target nucleic acids are DETECTED. The SARS-CoV-2 RNA is generally detectable in upper and lower  respiratory specimens dur ing the acute phase of infection.  Positive  results are indicative of  active infection with SARS-CoV-2.  Clinical  correlation with patient history and other diagnostic information is  necessary to determine patient infection status.  Positive results do  not rule out bacterial infection or co-infection with other viruses. If result is PRESUMPTIVE POSTIVE SARS-CoV-2 nucleic acids MAY BE PRESENT.   A presumptive positive result was obtained on the submitted specimen  and confirmed on repeat testing.  While 2019 novel coronavirus  (SARS-CoV-2) nucleic acids may be present in the submitted sample  additional confirmatory testing may be necessary for epidemiological  and / or clinical management purposes  to differentiate between  SARS-CoV-2 and other Sarbecovirus currently known to infect humans.  If clinically indicated additional testing with an alternate test  methodology [phone]) is advised. The SARS-CoV-2 RNA is generally  detectable in upper and lower respiratory sp ecimens during the acute  phase of infection. The expected result is Negative. Fact Sheet for Patients:  BoilerBrush.com.cy Fact Sheet for Healthcare Providers: https://pope.com/ This test is not yet approved or cleared by the [place] FDA and has been authorized for detection and/or diagnosis of SARS-CoV-2 by FDA under an Emergency Use Authorization (EUA).  This EUA  will remain in effect (meaning this test can be used) for the duration of the COVID-19 declaration under Section 564(b)(1) of the Act, 21 U.S.C. section 360bbb-3(b)(1), unless the authorization is terminated or revoked sooner. Performed at [facility], [address], [place], [place] [address]       Studies: Dg Orthopantogram  Result Date: 10/08/2018 CLINICAL DATA:  Poor dentition. Preoperative for laryngeal carcinoma. EXAM: ORTHOPANTOGRAM/PANORAMIC COMPARISON:  None. FINDINGS: Missing LEFT maxillary teeth from a first bicuspid through distal molar. Missing RIGHT maxillary teeth from second bicuspid through distal molar. Missing RIGHT mandibular teeth from first through distal molar. Missing LEFT mandibular first molar. No periapical lucency to suggest abscess. No definite caries. IMPRESSION: 1. Numerous missing mandibular and maxillary teeth, as detailed above. 2. No evidence of periapical dental abscess. Electronically Signed   By: Bary Richard M.D.   On: 10/08/2018 13:27    Scheduled Meds: . chlorhexidine  15 mL Mouth Rinse BID  . doxazosin  1 mg Per Tube Daily  . feeding supplement (PRO-STAT SUGAR FREE 64)  30 mL Per Tube BID  . hydrocortisone   Rectal QID  . lidocaine  1 application Urethral Once  . mouth rinse  15 mL Mouth Rinse q12n4p  . metoprolol tartrate  12.5 mg Per Tube BID  . simethicone  160 mg Per Tube QID    Continuous Infusions: . dextrose 5 % and 0.45% NaCl 100 mL/hr at 10/09/18 0400  . feeding supplement (OSMOLITE 1.5 CAL) 1,000 mL (10/08/18 2248)  . lactated ringers 10 mL/hr at 10/07/18 0859     LOS: 12 days     Darlin Drop, MD [facility] Pager [phone]  If 7PM-7AM, please contact night-coverage [email or web address] Password [facility] 10/09/2018, 11:33 AM

## 2018-10-09 NOTE — Progress Notes (Signed)
I/o cath completed, output was 530 cc. Post I/o cath bladder scan was 8ml. Will continue to monitor.

## 2018-10-09 NOTE — Progress Notes (Signed)
Subjective: Tolerated tube feeding. Still has difficulty voiding.  Objective: Vital signs in last 24 hours: Temp:  [97.7 F (36.5 C)-98.3 F (36.8 C)] 97.9 F (36.6 C) (07/15 0410) Pulse Rate:  [81-108] 98 (07/15 0410) Resp:  [17-22] 19 (07/15 0410) BP: (120-129)/(61-72) 120/67 (07/15 0410) SpO2:  [92 %-100 %] 96 % (07/15 0410) FiO2 (%):  [28 %] 28 % (07/15 0342) Weight:  [60.8 kg] 60.8 kg (07/15 0410)  General appearance:alert, cooperative and no distress Head:Normocephalic, without obvious abnormality, atraumatic Eyes:conjunctivae/corneas clear. PERRL, EOM's intact.  Ears:normal TM's and external ear canals both ears Nose:Nares normal. Septum midline. Mucosa normal. No drainage or sinus tenderness. Throat:lips, mucosa, and tongue normal; Neck:Trach midline. No bleeding. Neurologic:Grossly normal  No results for input(s): WBC, HGB, HCT, PLT in the last 72 hours. Recent Labs    10/08/18 0654 10/09/18 0359  NA 136 138  K 4.2 4.6  CL 102 103  CO2 27 26  GLUCOSE 131* 114*  BUN 8 10  CREATININE 0.77 0.67  CALCIUM 8.6* 8.8*    Medications:  I have reviewed the patient's current medications. Scheduled: . chlorhexidine  15 mL Mouth Rinse BID  . doxazosin  1 mg Per Tube Daily  . feeding supplement (PRO-STAT SUGAR FREE 64)  30 mL Per Tube BID  . hydrocortisone   Rectal QID  . lidocaine  1 application Urethral Once  . mouth rinse  15 mL Mouth Rinse q12n4p  . metoprolol tartrate  12.5 mg Per Tube BID  . simethicone  160 mg Per Tube QID   Continuous: . dextrose 5 % and 0.45% NaCl 100 mL/hr at 10/09/18 0400  . feeding supplement (OSMOLITE 1.5 CAL) 1,000 mL (10/08/18 2248)  . lactated ringers 10 mL/hr at 10/07/18 4166    Assessment/Plan: Laryngeal SCCA, s/p trach and G-tube placement -Neck CT shows extensive laryngeal/pharyngeal/esophagealSCCA. - Now has anuncuffed#[id number]. - Flomax cannot be given through G-tube. Instead pt is started on doxazosin. Lurline Idol and G-tube care teaching today in anticipation of discharge. - Will need supplies at home for tube feeding and trach care.   LOS: 12 days   Logan Price W Logan Price 10/09/2018, 7:19 AM

## 2018-10-09 NOTE — Progress Notes (Signed)
0126: pt was due to void at midnight after coude cath was d/ced. Pt tried twice, but only voided 20 cc. Post void bladder scan was 637 ml. ENT note states starting Flomax but wasn't started. Paged on call [facility] K schorr NP.  [phone]: ON call NP  K schorr called back, I told her I/O cath didn't work last time and he have had coude cath twice. She told me to try I/o cath . Also asked for cream for bleeding hemorrhoids. Will continue to monitor.

## 2018-10-10 LAB — CBC WITH DIFFERENTIAL/PLATELET
Abs Immature Granulocytes: 0.13 10*3/uL — ABNORMAL HIGH (ref 0.00–0.07)
Basophils Absolute: 0.1 10*3/uL (ref 0.0–0.1)
Basophils Relative: 0 %
Eosinophils Absolute: 0.1 10*3/uL (ref 0.0–0.5)
Eosinophils Relative: 0 %
HCT: 36.1 % — ABNORMAL LOW (ref 39.0–52.0)
Hemoglobin: 11.6 g/dL — ABNORMAL LOW (ref 13.0–17.0)
Immature Granulocytes: 1 %
Lymphocytes Relative: 4 %
Lymphs Abs: 0.9 10*3/uL (ref 0.7–4.0)
MCH: 28.5 pg (ref 26.0–34.0)
MCHC: 32.1 g/dL (ref 30.0–36.0)
MCV: 88.7 fL (ref 80.0–100.0)
Monocytes Absolute: 1 10*3/uL (ref 0.1–1.0)
Monocytes Relative: 5 %
Neutro Abs: 19.1 10*3/uL — ABNORMAL HIGH (ref 1.7–7.7)
Neutrophils Relative %: 90 %
Platelets: 328 10*3/uL (ref 150–400)
RBC: 4.07 MIL/uL — ABNORMAL LOW (ref 4.22–5.81)
RDW: 13.2 % (ref 11.5–15.5)
WBC: 21.2 10*3/uL — ABNORMAL HIGH (ref 4.0–10.5)
nRBC: 0 % (ref 0.0–0.2)

## 2018-10-10 LAB — BASIC METABOLIC PANEL
Anion gap: 12 (ref 5–15)
BUN: 14 mg/dL (ref 8–23)
CO2: 26 mmol/L (ref 22–32)
Calcium: 8.9 mg/dL (ref 8.9–10.3)
Chloride: 94 mmol/L — ABNORMAL LOW (ref 98–111)
Creatinine, Ser: 0.67 mg/dL (ref 0.61–1.24)
GFR calc Af Amer: >60 mL/min (ref >60)
GFR calc non Af Amer: >60 mL/min (ref >60)
Glucose, Bld: 161 mg/dL — ABNORMAL HIGH (ref 70–99)
Potassium: 4.1 mmol/L (ref 3.5–5.1)
Sodium: 132 mmol/L — ABNORMAL LOW (ref 135–145)

## 2018-10-10 LAB — GLUCOSE, CAPILLARY
Glucose-Capillary: 122 mg/dL — ABNORMAL HIGH (ref 70–99)
Glucose-Capillary: 135 mg/dL — ABNORMAL HIGH (ref 70–99)
Glucose-Capillary: 137 mg/dL — ABNORMAL HIGH (ref 70–99)
Glucose-Capillary: 138 mg/dL — ABNORMAL HIGH (ref 70–99)
Glucose-Capillary: 145 mg/dL — ABNORMAL HIGH (ref 70–99)
Glucose-Capillary: 148 mg/dL — ABNORMAL HIGH (ref 70–99)
Glucose-Capillary: 184 mg/dL — ABNORMAL HIGH (ref 70–99)

## 2018-10-10 LAB — MAGNESIUM: Magnesium: 1.5 mg/dL — ABNORMAL LOW (ref 1.7–2.4)

## 2018-10-10 LAB — PHOSPHORUS: Phosphorus: 3.5 mg/dL (ref 2.5–4.6)

## 2018-10-10 MED ORDER — PROMETHAZINE HCL 25 MG/ML IJ SOLN: 12.5000 mg | Freq: Four times a day (QID) | INTRAMUSCULAR | Status: DC | PRN

## 2018-10-10 MED ORDER — METOPROLOL TARTRATE 25 MG/10 ML ORAL SUSPENSION
25.0000 mg | Freq: Two times a day (BID) | ORAL | Status: DC
  Administered 2018-10-10 – 2018-10-13 (×7): 25 mg
  Filled 2018-10-10 (×8): qty 10

## 2018-10-10 MED ORDER — MAGNESIUM SULFATE 4 GM/100ML IV SOLN
4.0000 g | Freq: Once | INTRAVENOUS | Status: AC
  Administered 2018-10-10: 4 g via INTRAVENOUS
  Filled 2018-10-10: qty 100

## 2018-10-10 MED ORDER — LOPERAMIDE HCL 1 MG/7.5ML PO SUSP: 2.0000 mg | ORAL | 3 refills | Status: DC | PRN

## 2018-10-10 MED ORDER — OSMOLITE 1.5 CAL PO LIQD
237.0000 mL | Freq: Every day | ORAL | Status: DC
  Administered 2018-10-10 – 2018-10-17 (×39): 237 mL
  Filled 2018-10-10 (×3): qty 237
  Filled 2018-10-10: qty 1000
  Filled 2018-10-10 (×11): qty 237
  Filled 2018-10-10: qty 1000
  Filled 2018-10-10 (×7): qty 237
  Filled 2018-10-10: qty 1000
  Filled 2018-10-10 (×24): qty 237

## 2018-10-10 MED ORDER — ONDANSETRON HCL 4 MG/2ML IJ SOLN
4.0000 mg | Freq: Four times a day (QID) | INTRAMUSCULAR | Status: DC | PRN
  Administered 2018-10-10 – 2018-10-20 (×4): 4 mg via INTRAVENOUS
  Filled 2018-10-10 (×4): qty 2

## 2018-10-10 MED ORDER — LOPERAMIDE HCL 1 MG/7.5ML PO SUSP
2.0000 mg | ORAL | Status: DC | PRN
  Administered 2018-10-10: 2 mg
  Filled 2018-10-10 (×3): qty 15

## 2018-10-10 NOTE — Plan of Care (Signed)
  Problem: Health Behavior/Discharge Planning: Goal: Ability to manage health-related needs will improve Outcome: Not Progressing   Problem: Clinical Measurements: Goal: Respiratory complications will improve Outcome: Not Progressing   Problem: Nutrition: Goal: Adequate nutrition will be maintained Outcome: Not Progressing

## 2018-10-10 NOTE — Progress Notes (Signed)
OT Cancellation Note  Patient Details Name: Logan Price MRN: [id number] DOB: 09-26-1945   Cancelled Treatment:    Reason Eval/Treat Not Completed: Other (comment)(pt with diarrhea and vomiting). Nursing requesting to hold therapy. Will continue to follow acutely.  Darrol Jump OTR/L 10/10/2018, 2:44 PM

## 2018-10-10 NOTE — Progress Notes (Signed)
PT Cancellation Note  Patient Details Name: Logan Price MRN: [id number] DOB: 1945/09/09   Cancelled Treatment:    Reason Eval/Treat Not Completed: Medical issues which prohibited therapy.  Pt was having GI upset earlier, now is vomiting.  Per nsg both times is a hold therapy, and will try again at another time.   Ramond Dial 10/10/2018, 2:10 PM   Mee Hives, PT MS Acute Rehab Dept. Number: [place]

## 2018-10-10 NOTE — Progress Notes (Signed)
Patient has increased thick secretions and requires multiple suctions. Patient placed back on oxygen by respiratory d/t patient's low sats of 88-92%. Patient is now 94-95%. Patient c/o of nausea after half of the osmolite was given. Patient was given zofran to help with the nausea. Patient stated that he vomited a small amount. Will continue to monitor

## 2018-10-10 NOTE — Progress Notes (Signed)
Subjective: Pt has been experiencing diarrhea. Tachycardic.  Discharge on hold.  Objective: Vital signs in last 24 hours: Temp:  [97.4 F (36.3 C)-98.4 F (36.9 C)] 98.3 F (36.8 C) (07/16 0731) Pulse Rate:  [90-126] 106 (07/16 0731) Resp:  [17-27] 20 (07/16 0731) BP: (150-161)/(76-98) 152/76 (07/16 0731) SpO2:  [94 %-100 %] 97 % (07/16 0731) FiO2 (%):  [28 %] 28 % (07/15 1130) Weight:  [64.5 kg] 64.5 kg (07/16 0500)  General appearance:alert, cooperative and no distress Head:Normocephalic, without obvious abnormality, atraumatic Eyes:conjunctivae/corneas clear. PERRL, EOM's intact.  Ears:normal TM's and external ear canals both ears Nose:Nares normal. Septum midline. Mucosa normal. No drainage or sinus tenderness. Throat:lips, mucosa, and tongue normal; Neck:Trach midline. No bleeding. Neurologic:Grossly normal Abdomen: G-tube in place,  Recent Labs    10/10/18 0603  WBC 21.2*  HGB 11.6*  HCT 36.1*  PLT 328   Recent Labs    10/09/18 0359 10/10/18 0603  NA 138 132*  K 4.6 4.1  CL 103 94*  CO2 26 26  GLUCOSE 114* 161*  BUN 10 14  CREATININE 0.67 0.67  CALCIUM 8.8* 8.9    Medications:  I have reviewed the patient's current medications. Scheduled: . chlorhexidine  15 mL Mouth Rinse BID  . doxazosin  1 mg Per Tube Daily  . feeding supplement (PRO-STAT SUGAR FREE 64)  30 mL Per Tube BID  . hydrocortisone   Rectal QID  . lidocaine  1 application Urethral Once  . mouth rinse  15 mL Mouth Rinse q12n4p  . metoprolol tartrate  25 mg Per Tube BID  . simethicone  160 mg Per Tube QID   Continuous: . dextrose 5 % and 0.45% NaCl 100 mL/hr at 10/10/18 0100  . feeding supplement (OSMOLITE 1.5 CAL) Stopped (10/10/18 0522)  . lactated ringers 10 mL/hr at 10/07/18 0859    Assessment/Plan: Laryngeal SCCA, s/p trachand G-tube placement - Diarrhea. Not tolerating continuous tube feed. -Neck CT shows extensive laryngeal/pharyngeal/esophagealSCCA. -Now has  anuncuffed#[id number]. - Flomax cannot be given through G-tube. Instead pt is started on doxazosin. Lurline Idol and G-tube care teaching. - Will need supplies at home for tube feeding and trach care.   LOS: 13 days   Logan Price W Logan Price 10/10/2018, 7:46 AM

## 2018-10-10 NOTE — Progress Notes (Signed)
Pt with multiple loose bowel movements overnight. Paged RD for direction for tube feeding recommendations. Per RD hold restarting continuous feeding and switch to bolus feeding. RD will add in note and orders for feeding amount and flush.Recommended adding imodium to help with loose stool. Pt wife coming at 1pm for teaching. Will continue to monitor. Amanda Cockayne, RN

## 2018-10-10 NOTE — Progress Notes (Signed)
Notified Baltazar Najjar, NP about multiple loose stools. Care order written to stop tube feeding for 3 hours (stopped @0522 ) and restart at 20/hr and have day RN f/u with attending on day shift.

## 2018-10-10 NOTE — Progress Notes (Signed)
PROGRESS NOTE  ANUBIS BLEWITT NWG:[id number] DOB: [birth year] DOA: 09/27/2018 PCP: [name], FNP  Consulting Physician: Dr. Janeece Riggers, ENT  Reason for consult: Medical management.  HPI/Recap of past 48 hours: 73 year old with history of GERD, essential hypertension, hyperlipidemia who recently diagnosed neck mass concerning for malignancy was admitted to the hospital.  Overnight patient went into SVT therefore medical team was consulted.  Patient was given adenosine x1 which converted back to normal sinus rhythm.  G-tube placed by surgery 7/13, tolerating tube feeds.  Metoprolol 12.5 mg twice daily started.   10/10/18: Patient was seen and examined at bedside.  He has had multiple watery stools.  He denies nausea or abdominal pain.  Denies subjective fever night sweats or chills.  Started on IV fluids to avoid dehydration.  Given Imodium as needed for diarrhea.    Assessment/Plan: Principal Problem:   Squamous cell carcinoma of glottis (HCC) Active Problems:   Laryngeal mass   Goals of care, counseling/discussion   Dysphagia   Loss of weight   PSVT (paroxysmal supraventricular tachycardia) (HCC)   Protein-calorie malnutrition, severe  Paroxysmal supraventricular tachycardia Reviewed vital signs and they are stable at this time Electrolytes are normal TSH is normal Increase dose of metoprolol to 25 mg twice daily via G-tube.    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  Status post G-tube placement on 10/07/2018 Continue Robinul as needed for secretions  Diarrhea Denies nausea, abdominal pain, night sweats or chills Afebrile  Leukocytosis noted this morning Given Imodium as needed for diarrhea, will DC until we have results of GI panel Recommend GI panel If the patient develops a fever recommend C. difficile PCR  Leukocytosis in the setting of diarrhea WBC 21K with neutrophil 19 K Will order GI panel If patient develops fever  recommend C. difficile PCR    Thank you for allowing [place] to participate in the care of this patient.  TRH will follow with you.  Objective: Vitals:   10/10/18 0731 10/10/18 0953 10/10/18 1100 10/10/18 1133  BP: (!) 152/76  (!) 143/70 (!) 143/70  Pulse: (!) 106 (!) 107 (!) 102 (!) 101  Resp: 20 20 20  (!) 21  Temp: 98.3 F (36.8 C)  98.1 F (36.7 C)   TempSrc: Oral  Oral   SpO2: 97% 98% 97% 93%  Weight:      Height:        Intake/Output Summary (Last 24 hours) at 10/10/2018 1359 Last data filed at 10/10/2018 1050 Gross per 24 hour  Intake 3949.5 ml  Output -  Net 3949.5 ml   Filed Weights   10/08/18 0500 10/09/18 0410 10/10/18 0500  Weight: 61.2 kg 60.8 kg 64.5 kg    Exam:  . General: 73 y.o. year-old male thin built in no acute distress.  Alert and oriented x3. . Cardiovascular: Rate and rhythm no rubs or gallops.  No JVD or thyromegaly.  Marland Kitchen Respiratory: Trach collar in place.  Clear to auscultation no wheezes or rales.  Good inspiratory effort.   . Abdomen: Soft nontender nondistended normal bowel sounds present.  PEG tube in place.   . Musculoskeletal: No lower extremity edema.  2/4 pulses in all 4 extremities. Marland Kitchen Psychiatry: Mood is appropriate for condition and setting  Data Reviewed: CBC: Recent Labs  Lab 10/10/18 0603  WBC 21.2*  NEUTROABS 19.1*  HGB 11.6*  HCT 36.1*  MCV 88.7  PLT 328   Basic Metabolic Panel: Recent Labs  Lab 10/06/18 0340  10/06/18 1648 10/07/18 0447 10/07/18 1621 10/08/18 0654 10/09/18 0359 10/10/18 0603  NA 136  --  135  --  136 138 132*  K 4.7  --  5.1  --  4.2 4.6 4.1  CL 102  --  100  --  102 103 94*  CO2 24  --  28  --  27 26 26   GLUCOSE 114*  --  120*  --  131* 114* 161*  BUN 5*  --  7*  --  8 10 14   CREATININE 0.70  --  0.77  --  0.77 0.67 0.67  CALCIUM 9.0  --  8.8*  --  8.6* 8.8* 8.9  MG 1.8 1.7 1.8 1.6* 1.6* 1.8 1.5*  PHOS 3.8 4.1 4.1  --   --  4.1 3.5   GFR: Estimated Creatinine Clearance: 77.3 mL/min (by C-G  formula based on SCr of 0.67 mg/dL). Liver Function Tests: No results for input(s): AST, ALT, ALKPHOS, BILITOT, PROT, ALBUMIN in the last 168 hours. No results for input(s): LIPASE, AMYLASE in the last 168 hours. No results for input(s): AMMONIA in the last 168 hours. Coagulation Profile: No results for input(s): INR, PROTIME in the last 168 hours. Cardiac Enzymes: No results for input(s): CKTOTAL, CKMB, CKMBINDEX, TROPONINI in the last 168 hours. BNP (last 3 results) No results for input(s): PROBNP in the last 8760 hours. HbA1C: No results for input(s): HGBA1C in the last 72 hours. CBG: Recent Labs  Lab 10/09/18 2042 10/10/18 0024 10/10/18 0416 10/10/18 0820 10/10/18 1121  GLUCAP 139* 138* 148* 145* 184*   Lipid Profile: No results for input(s): CHOL, HDL, LDLCALC, TRIG, CHOLHDL, LDLDIRECT in the last 72 hours. Thyroid Function Tests: No results for input(s): TSH, T4TOTAL, FREET4, T3FREE, THYROIDAB in the last 72 hours. Anemia Panel: No results for input(s): VITAMINB12, FOLATE, FERRITIN, TIBC, IRON, RETICCTPCT in the last 72 hours. Urine analysis:    Component Value Date/Time   COLORURINE YELLOW 03/30/2015 1330   APPEARANCEUR CLEAR 03/30/2015 1330   LABSPEC >1.030 (H) 03/30/2015 1330   PHURINE 5.5 03/30/2015 1330   GLUCOSEU NEGATIVE 03/30/2015 1330   HGBUR TRACE (A) 03/30/2015 1330   BILIRUBINUR NEGATIVE 03/30/2015 1330   KETONESUR NEGATIVE 03/30/2015 1330   PROTEINUR NEGATIVE 03/30/2015 1330   NITRITE NEGATIVE 03/30/2015 1330   LEUKOCYTESUR NEGATIVE 03/30/2015 1330   Sepsis Labs: @LABRCNTIP (procalcitonin:4,lacticidven:4)  ) Recent Results (from the past 240 hour(s))  SARS Coronavirus 2 (CEPHEID - Performed in [facility] hospital lab), Hosp Order     Status: None   Collection Time: 10/02/18 11:06 AM   Specimen: Nasopharyngeal Swab  Result Value Ref Range Status   SARS Coronavirus 2 NEGATIVE NEGATIVE Final    Comment: (NOTE) If result is NEGATIVE SARS-CoV-2  target nucleic acids are NOT DETECTED. The SARS-CoV-2 RNA is generally detectable in upper and lower  respiratory specimens during the acute phase of infection. The lowest  concentration of SARS-CoV-2 viral copies this assay can detect is 250  copies / mL. A negative result does not preclude SARS-CoV-2 infection  and should not be used as the sole basis for treatment or other  patient management decisions.  A negative result may occur with  improper specimen collection / handling, submission of specimen other  than nasopharyngeal swab, presence of viral mutation(s) within the  areas targeted by this assay, and inadequate number of viral copies  (<250 copies / mL). A negative result must be combined with clinical  observations, patient history, and epidemiological information. If result  is POSITIVE SARS-CoV-2 target nucleic acids are DETECTED. The SARS-CoV-2 RNA is generally detectable in upper and lower  respiratory specimens dur ing the acute phase of infection.  Positive  results are indicative of active infection with SARS-CoV-2.  Clinical  correlation with patient history and other diagnostic information is  necessary to determine patient infection status.  Positive results do  not rule out bacterial infection or co-infection with other viruses. If result is PRESUMPTIVE POSTIVE SARS-CoV-2 nucleic acids MAY BE PRESENT.   A presumptive positive result was obtained on the submitted specimen  and confirmed on repeat testing.  While 2019 novel coronavirus  (SARS-CoV-2) nucleic acids may be present in the submitted sample  additional confirmatory testing may be necessary for epidemiological  and / or clinical management purposes  to differentiate between  SARS-CoV-2 and other Sarbecovirus currently known to infect humans.  If clinically indicated additional testing with an alternate test  methodology [phone]) is advised. The SARS-CoV-2 RNA is generally  detectable in upper and lower  respiratory sp ecimens during the acute  phase of infection. The expected result is Negative. Fact Sheet for Patients:  BoilerBrush.com.cy Fact Sheet for Healthcare Providers: https://pope.com/ This test is not yet approved or cleared by the [place] FDA and has been authorized for detection and/or diagnosis of SARS-CoV-2 by FDA under an Emergency Use Authorization (EUA).  This EUA will remain in effect (meaning this test can be used) for the duration of the COVID-19 declaration under Section 564(b)(1) of the Act, 21 U.S.C. section 360bbb-3(b)(1), unless the authorization is terminated or revoked sooner. Performed at [facility], [address], [place], [place] [address]       Studies: No results found.  Scheduled Meds: . chlorhexidine  15 mL Mouth Rinse BID  . doxazosin  1 mg Per Tube Daily  . feeding supplement (OSMOLITE 1.5 CAL)  237 mL Per Tube 6 X Daily  . feeding supplement (PRO-STAT SUGAR FREE 64)  30 mL Per Tube BID  . hydrocortisone   Rectal QID  . lidocaine  1 application Urethral Once  . mouth rinse  15 mL Mouth Rinse q12n4p  . metoprolol tartrate  25 mg Per Tube BID  . simethicone  160 mg Per Tube QID    Continuous Infusions: . dextrose 5 % and 0.45% NaCl 100 mL/hr at 10/10/18 1040  . lactated ringers 10 mL/hr at 10/07/18 0859     LOS: 13 days     Darlin Drop, MD [facility] Pager [phone]  If 7PM-7AM, please contact night-coverage [email or web address] Password [facility] 10/10/2018, 1:59 PM

## 2018-10-10 NOTE — Progress Notes (Signed)
Patient having multiple loose stools throughout the night. This was also occurring during day shift. Patient's heart rate elevated to 120's-130's while patient is up on the [facility]. Around [phone] Charge RN had to keep the patient from falling when he was trying to get up to the bedside commode. It was highly suggested to the patient that he calls when he needs to get up to the [facility] and back into bed. Patient acknowledged that he understood. This RN told the patient that if he was to fall it would delay his discharge. Will continue to monitor

## 2018-10-10 NOTE — Progress Notes (Signed)
RT offered patient humidified room air with a trach collar, but the patient refused.  Patient tolerating room air well and has a strong productive cough. RT will continue to monitor.

## 2018-10-10 NOTE — Progress Notes (Signed)
RT placed patient on aerosol trach collar with humidified air 5 L, 21% due to increasingly thick secretions.  RN notified.

## 2018-10-10 NOTE — Progress Notes (Signed)
This RN received call from CM that if pt were to d/c it would have to be before 2:30pm for [facility] needs. MD paged, pt with less loose stool, better with bolus feed and imodium. Pt family member present to do tube feed teaching.  1400: Pt began vomiting up tube feed. Pt complains of nausea.  MD paged again, will hold off on d/c. Explained to family that this RN will hold tube feed in presence of vomiting, will call to reschedule teaching prior to d/c.

## 2018-10-10 NOTE — Progress Notes (Signed)
Nutrition Follow-up  DOCUMENTATION CODES:   Severe malnutrition in context of chronic illness  INTERVENTION:   Transition to bolus:   -Osmolite 1.5 x six cans daily (can split into 2 cans at each meal if tolerated).  -30 ml Prostat or other protein modular BID -Free water flushes 300 ml four times daily   TF provides: 2330 kcal, 120 grams protein, and 1086 ml free water (2286 ml with flushes).   NUTRITION DIAGNOSIS:   Severe Malnutrition related to chronic illness, cancer and cancer related treatments as evidenced by percent weight loss, severe muscle depletion, moderate muscle depletion, mild fat depletion.  Ongoing   GOAL:   Patient will meet greater than or equal to 90% of their needs  Addressed via TF  MONITOR:   Labs, I & O's, Skin, TF tolerance, Weight trends  REASON FOR ASSESSMENT:   NPO/Clear Liquid Diet    ASSESSMENT:   Patient with PMH significant for diverticulitis, gastritis, HLD, HTN, laryngeal cancer, and esophogeal dysphagia. 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   7/3- tracheostomy, laryngoscopy and biopsy 7/10- IR unable to place G-tube due to anatomy; colon anterior to stomach 7/11- NGT placed- TF started 7/13- G-tube placed   RD working remotely.  Spoke with RN via phone. Pt with ongoing loose stools for the last 3 days. Plan to transition to bolus feeding today. Hopefully scheduled regimen will help with loose BMs. Hospitalist to give imodium. Plan discussed with wife. She is to come to the unit around 1pm for tube feeding administration education. Follow up note sent to [facility] RD as pt will likely require follow up.   Admission weight: 63 kg Current weight 64.5 kg  Drips: D5 in 1/2 NS @ 100 ml/hr  Medications: reviewed  Labs: Na 132 (L) Mg 1.5 (L)   Diet Order:   Diet Order            Diet general        Diet NPO time specified Except for: Sips with Meds   Diet effective midnight              EDUCATION NEEDS:   Not appropriate for education at this time  Skin:  Skin Assessment: Skin Integrity Issues: Skin Integrity Issues:: Incisions, Other (Comment) Incisions: neck/abdomen Other: new wound- sacrum  Last BM:  7/15  Height:   Ht Readings from Last 1 Encounters:  09/27/18 5\' 9"  (1.753 m)    Weight:   Wt Readings from Last 1 Encounters:  10/10/18 64.5 kg    Ideal Body Weight:  72.7 kg  BMI:  Body mass index is 21 kg/m.  Estimated Nutritional Needs:   Kcal:  2300-2500 kcal  Protein:  115-130 grams  Fluid:  >/= 2.3 L/day   Mariana Single RD, LDN Clinical Nutrition Pager # - [phone]

## 2018-10-10 NOTE — Progress Notes (Signed)
Updated pt wife Dedra Skeens, pt wife to come do teaching on bolus tube feed at 1pm. All questions answered.  Amanda Cockayne, RN

## 2018-10-10 NOTE — Plan of Care (Signed)
  Problem: Health Behavior/Discharge Planning: Goal: Ability to manage health-related needs will improve Outcome: Progressing   Problem: Clinical Measurements: Goal: Respiratory complications will improve Outcome: Progressing   

## 2018-10-11 ENCOUNTER — Inpatient Hospital Stay (HOSPITAL_COMMUNITY): Payer: Medicare Other

## 2018-10-11 DIAGNOSIS — R1312 Dysphagia, oropharyngeal phase: Secondary | ICD-10-CM

## 2018-10-11 DIAGNOSIS — C320 Malignant neoplasm of glottis: Secondary | ICD-10-CM

## 2018-10-11 LAB — BASIC METABOLIC PANEL
Anion gap: 11 (ref 5–15)
BUN: 18 mg/dL (ref 8–23)
CO2: 24 mmol/L (ref 22–32)
Calcium: 8.7 mg/dL — ABNORMAL LOW (ref 8.9–10.3)
Chloride: 94 mmol/L — ABNORMAL LOW (ref 98–111)
Creatinine, Ser: 0.94 mg/dL (ref 0.61–1.24)
GFR calc Af Amer: >60 mL/min (ref >60)
GFR calc non Af Amer: >60 mL/min (ref >60)
Glucose, Bld: 150 mg/dL — ABNORMAL HIGH (ref 70–99)
Potassium: 4.6 mmol/L (ref 3.5–5.1)
Sodium: 129 mmol/L — ABNORMAL LOW (ref 135–145)

## 2018-10-11 LAB — CBC WITH DIFFERENTIAL/PLATELET
Abs Immature Granulocytes: 0.17 10*3/uL — ABNORMAL HIGH (ref 0.00–0.07)
Basophils Absolute: 0 10*3/uL (ref 0.0–0.1)
Basophils Relative: 0 %
Eosinophils Absolute: 0 10*3/uL (ref 0.0–0.5)
Eosinophils Relative: 0 %
HCT: 34.3 % — ABNORMAL LOW (ref 39.0–52.0)
Hemoglobin: 11.2 g/dL — ABNORMAL LOW (ref 13.0–17.0)
Immature Granulocytes: 1 %
Lymphocytes Relative: 4 %
Lymphs Abs: 1 10*3/uL (ref 0.7–4.0)
MCH: 28.6 pg (ref 26.0–34.0)
MCHC: 32.7 g/dL (ref 30.0–36.0)
MCV: 87.5 fL (ref 80.0–100.0)
Monocytes Absolute: 1.6 10*3/uL — ABNORMAL HIGH (ref 0.1–1.0)
Monocytes Relative: 7 %
Neutro Abs: 20.8 10*3/uL — ABNORMAL HIGH (ref 1.7–7.7)
Neutrophils Relative %: 88 %
Platelets: 305 10*3/uL (ref 150–400)
RBC: 3.92 MIL/uL — ABNORMAL LOW (ref 4.22–5.81)
RDW: 13.5 % (ref 11.5–15.5)
WBC: 23.7 10*3/uL — ABNORMAL HIGH (ref 4.0–10.5)
nRBC: 0 % (ref 0.0–0.2)

## 2018-10-11 LAB — GLUCOSE, CAPILLARY
Glucose-Capillary: 128 mg/dL — ABNORMAL HIGH (ref 70–99)
Glucose-Capillary: 134 mg/dL — ABNORMAL HIGH (ref 70–99)
Glucose-Capillary: 146 mg/dL — ABNORMAL HIGH (ref 70–99)
Glucose-Capillary: 157 mg/dL — ABNORMAL HIGH (ref 70–99)
Glucose-Capillary: 159 mg/dL — ABNORMAL HIGH (ref 70–99)
Glucose-Capillary: 174 mg/dL — ABNORMAL HIGH (ref 70–99)

## 2018-10-11 LAB — GASTROINTESTINAL PANEL BY PCR, STOOL (REPLACES STOOL CULTURE)

## 2018-10-11 LAB — MAGNESIUM: Magnesium: 2.6 mg/dL — ABNORMAL HIGH (ref 1.7–2.4)

## 2018-10-11 LAB — LACTIC ACID, PLASMA: Lactic Acid, Venous: 1.5 mmol/L (ref 0.5–1.9)

## 2018-10-11 LAB — PHOSPHORUS: Phosphorus: 5.2 mg/dL — ABNORMAL HIGH (ref 2.5–4.6)

## 2018-10-11 LAB — PROCALCITONIN: Procalcitonin: <0.1 ng/mL

## 2018-10-11 MED ORDER — LOPERAMIDE HCL 1 MG/7.5ML PO SUSP
2.0000 mg | ORAL | Status: AC | PRN
  Administered 2018-10-11: 2 mg via ORAL
  Filled 2018-10-11 (×2): qty 15

## 2018-10-11 MED ORDER — SODIUM CHLORIDE 0.9 % IV SOLN
INTRAVENOUS | Status: DC
  Administered 2018-10-11 (×2): via INTRAVENOUS

## 2018-10-11 MED ORDER — LEVOFLOXACIN IN D5W 750 MG/150ML IV SOLN
750.0000 mg | INTRAVENOUS | Status: DC
  Administered 2018-10-11: 750 mg via INTRAVENOUS
  Filled 2018-10-11 (×2): qty 150

## 2018-10-11 NOTE — TOC Progression Note (Signed)
Transition of Care (TOC) - Progression Note  Marvetta Gibbons RN, BSN Transitions of Care Unit 4E- RN Case Manager [phone]   Patient Details  Name: Logan Price MRN: [id number] Date of Birth: 11/04/1945  Transition of Care [facility]) CM/SW Contact  Dahlia Client, Romeo Rabon, RN Phone Number: 10/11/2018, 2:37 PM  Clinical Narrative:    Transition of care needs for trach and TF at home have been arranged with [place]. [facility] following for [facility] needs once pt discharged. Pt was for discharge on 7/15 however wife needed further teaching, on 7/16 pt was not tolerating TF and change was made to bolus feeds- when wife came to teach on bolus feeds pt began to have vomiting episode and discharge was held again. Today pt is not medically stable for transition home and [facility] notified - would be best for the safest home transition to be on 7/20 so that [facility] services can be in the home - if he where to d/c over weekend RT with [employer] can not be in the home until Monday 7/20.    Expected Discharge Plan: [place] Barriers to Discharge: Continued Medical Work up  Expected Discharge Plan and Services Expected Discharge Plan: [place]   Discharge Planning Services: CM Consult Post Acute Care Choice: Home Health   Expected Discharge Date: 10/10/18               DME Arranged: Tube feeding, Tube feeding pump, Trach supplies DME Agency: [facility] Date DME Agency Contacted: 10/10/18 Time DME Agency Contacted: 1000 Representative spoke with at DME Agency: Thedore Mins HH Arranged: RN, PT, OT, Nurse's Aide, Social Work [employer] Agency: [place] ([place]) Date [place]: 10/03/18 Time [place]: 1152 Representative spoke with at [place]: [place] ([place]) Interventions    Readmission Risk Interventions No flowsheet data found.

## 2018-10-11 NOTE — Progress Notes (Signed)
PT Cancellation Note  Patient Details Name: Logan Price MRN: [id number] DOB: Nov 06, 1945   Cancelled Treatment:    Reason Eval/Treat Not Completed: Patient declined, no reason specified;Fatigue/lethargy limiting ability to participate    PT attempted treatment x 2 and pt refused each attempt stating not feeling well and fatigue.  PT will attempt again as appropriate   DONAWERTH,KAREN 10/11/2018, 1:46 PM

## 2018-10-11 NOTE — Progress Notes (Addendum)
Nutrition Follow-up  DOCUMENTATION CODES:   Severe malnutrition in context of chronic illness  INTERVENTION:   Continue bolus feeding:  -Osmolite 1.5 x six cans daily -30 ml Prostat or other protein modular BID -Free water flushes 300 ml four times daily   TF provides: 2330 kcal, 120 grams protein, and 1086 ml free water (2286 ml with flushes).    NUTRITION DIAGNOSIS:   Severe Malnutrition related to chronic illness, cancer and cancer related treatments as evidenced by percent weight loss, severe muscle depletion, moderate muscle depletion, mild fat depletion.  Ongoing  GOAL:   Patient will meet greater than or equal to 90% of their needs  Progressing with TF  MONITOR:   Labs, I & O's, Skin, TF tolerance, Weight trends  REASON FOR ASSESSMENT:   NPO/Clear Liquid Diet    ASSESSMENT:   Patient with PMH significant for diverticulitis, gastritis, HLD, HTN, laryngeal cancer, and esophogeal dysphagia. 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   7/3- tracheostomy, laryngoscopy and biopsy 7/10-IR unable to place G-tube due to anatomy; colon anterior to stomach 7/11- NGT placed- TF started 7/13- G-tube placed   Spoke with RN. Pt had episode of vomiting yesterday and episode of nausea this am with first bolus feeding. Loose stools continue but are slightly better this afternoon. Imodium d/c to check for C.diff (came back negative). Plan to continue Osmolite 1.5 bolus feedings over the weekend. If loose stools continue, may need to look into changing formula. RN and patient made aware of plan.   Admission weight: 63 kg Current weight 64.5 kg  Medications: reviewed  Labs: Na 129 (L) Phosphorus 5.2 (H) Mg 2.6 (H) CBG 122-184   Diet Order:   Diet Order            Diet general        Diet NPO time specified Except for: Sips with Meds  Diet effective midnight              EDUCATION NEEDS:   Not  appropriate for education at this time  Skin:  Skin Assessment: Skin Integrity Issues: Skin Integrity Issues:: Incisions, Other (Comment) Incisions: neck/abdomen Other: new wound- sacrum  Last BM:  7/17  Height:   Ht Readings from Last 1 Encounters:  09/27/18 5\' 9"  (1.753 m)    Weight:   Wt Readings from Last 1 Encounters:  10/11/18 68 kg    Ideal Body Weight:  72.7 kg  BMI:  Body mass index is 22.14 kg/m.  Estimated Nutritional Needs:   Kcal:  2300-2500 kcal  Protein:  115-130 grams  Fluid:  >/= 2.3 L/day   Mariana Single RD, LDN Clinical Nutrition Pager # - [phone]

## 2018-10-11 NOTE — Progress Notes (Signed)
Occupational Therapy Treatment Patient Details Name: Logan Price MRN: [id number] DOB: 1945-07-03 Today's Date: 10/11/2018    History of present illness Pt is a 73 yo male admitted for planned trach and laryngeal biopsy 7/2. 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 Esophagram 6/26 showed silent gross aspiration of barium. PMH includes: HLD, HTN, hepatitis C, GERD, diverticulitis   OT comments  Pt progressing toward established goals. Pt limited this session secondary to fatigue. Pt currently requires minguard for ADL and minguard for functional mobility at RW level. Pt on RA throughout session, SpO2 98%-89% educated pt on pursed lip breathing during functional mobility. Pt will continue to benefit from skilled OT services to maximize safety and independence with ADL/IADL and functional mobility. Will continue to follow acutely and progress as tolerated.     Follow Up Recommendations  Home health OT    Equipment Recommendations  None recommended by OT    Recommendations for Other Services      Precautions / Restrictions Precautions Precautions: Fall Precaution Comments: increased trach secretions;Gtube Restrictions Weight Bearing Restrictions: No       Mobility Bed Mobility               General bed mobility comments: in recliner upon arrival  Transfers Overall transfer level: Needs assistance Equipment used: Rolling walker (2 wheeled) Transfers: Sit to/from [employer] Sit to Stand: Min guard Stand pivot transfers: Min guard       General transfer comment: minguard for safety and stability;pt reports feeling weak this date    Balance Overall balance assessment: Needs assistance   Sitting balance-Leahy Scale: Good     Standing balance support: No upper extremity supported;During functional activity Standing balance-Leahy Scale: Fair Standing balance comment: limited  tolerance for standing unsupported                           ADL either performed or assessed with clinical judgement   ADL Overall ADL's : Needs assistance/impaired     Grooming: Set up;Sitting                   Toilet Transfer: Ambulation;RW;Min guard Statistician Details (indicate cue type and reason): simulated to/from recliner Toileting- Clothing Manipulation and Hygiene: Sit to/from stand;Min guard       Functional mobility during ADLs: Min guard;Rolling walker General ADL Comments: pt limited activity tolerance this date;reports earlier episode of diarrhea making him weak     Biochemist, clinical      Cognition Arousal/Alertness: Awake/alert Behavior During Therapy: WFL for tasks assessed/performed Overall Cognitive Status: Within Functional Limits for tasks assessed                                 General Comments: pt demonstrated good safety awareness with transfers        Exercises Exercises: Other exercises Other Exercises Other Exercises: pt demonstrated good carryover of general UE exercises with use of theraband   Shoulder Instructions       General Comments Pt on RA, SpO2 98% at rest, 92% with mobility, 89% after mobility returning to 93% after rest break;educated pt on pursed lip breathing during mobilty    Pertinent Vitals/ Pain       Pain Assessment: No/denies pain  Home Living  Prior Functioning/Environment              Frequency  Min 2X/week        Progress Toward Goals  OT Goals(current goals can now be found in the care plan section)  Progress towards OT goals: Progressing toward goals  Acute Rehab OT Goals Patient Stated Goal: to take care of himself OT Goal Formulation: With patient Time For Goal Achievement: 10/16/18 Potential to Achieve Goals: Good ADL Goals Pt Will Perform Grooming:  Independently;standing Pt Will Perform Lower Body Bathing: Independently Pt Will Perform Lower Body Dressing: Independently;sit to/from stand Pt Will Transfer to Toilet: Independently;ambulating Pt Will Perform Toileting - Clothing Manipulation and hygiene: Independently;sit to/from stand  Plan Discharge plan needs to be updated    Co-evaluation                 AM-PAC OT "6 Clicks" Daily Activity     Outcome Measure   Help from another person eating meals?: Total Help from another person taking care of personal grooming?: A Little Help from another person toileting, which includes using toliet, bedpan, or urinal?: A Little Help from another person bathing (including washing, rinsing, drying)?: A Little Help from another person to put on and taking off regular upper body clothing?: A Little Help from another person to put on and taking off regular lower body clothing?: A Little 6 Click Score: 16    End of Session Equipment Utilized During Treatment: Rolling walker  OT Visit Diagnosis: Unsteadiness on feet (R26.81);Muscle weakness (generalized) (M62.81)   Activity Tolerance Patient limited by fatigue   Patient Left in chair;with call bell/phone within reach   Nurse Communication Mobility status        Time: 0981-1914 OT Time Calculation (min): 11 min  Charges: OT General Charges $OT Visit: 1 Visit OT Treatments $Self Care/Home Management : 8-22 mins  Diona Browner OTR/L Acute Rehabilitation Services Office: [phone]    Rebeca Alert 10/11/2018, 11:56 AM

## 2018-10-11 NOTE — Progress Notes (Signed)
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 Benjamine Mola.   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   Nutrition Status Yes No Comments  Weight changes? [x]  []    Swallowing concerns? [x]  []    PEG? [x]  []  Placed 10/07/18 while admitted to the hospital   Referrals Yes No Comments  Social Work? []  [x]    Dentistry? [x]  []  Dr. Enrique Sack  Swallowing therapy? []  [x]    Nutrition? [x]  []    Med/Onc? [x]  []  Dr. Maylon Peppers   Safety Issues Yes No Comments  Prior radiation? []  [x]    Pacemaker/ICD? []  [x]    Possible current pregnancy? []  [x]    Is the patient on methotrexate? []  [x]     Tobacco/Marijuana/Snuff/ETOH use: He is a former smoker. He does drink alcohol  Past/Anticipated interventions by otolaryngology, if any:  09/27/18 Dr. Benjamine Mola PROCEDURE PERFORMED:   1. Awake tracheostomy 2. Microdirect laryngoscopy and biopsy   Past/Anticipated interventions by medical oncology, if any:  Dr. Maylon Peppers saw while admitted 10/07/18   Current Complaints / other details:   Patient currently admitted to [facility] step down unit.

## 2018-10-11 NOTE — Progress Notes (Addendum)
2300: pt w/ x1 episode of emesis following med admin. Bolus feed was given about an hour ago. Mostly dry heaving, about 43mL of bile-like emesis. Pt was given PRN zofran by request w/ relief of symptoms. Split gauze was soiled and changed.  0000: Pt's desats to 85-86% w/ sleep, suctioned pt and placed on trach collar 4L, sats up to 94-95%. Pt has had 2 loose bowel movements so far. When offered imodium he refused. Will continue to monitor.

## 2018-10-11 NOTE — Progress Notes (Signed)
Patient continues to have excess secretions and needs to be suction frequently. Patient had an episode of runny stools. Patient cleaned up, condom cath placed d/t patient not wanting to void in urinal. Barrier cream, anusol cream applied. Patient suctioned and new dressings applied.

## 2018-10-11 NOTE — Progress Notes (Signed)
PROGRESS NOTE  Logan Price WUJ:[id number] DOB: 08-17-1945 DOA: 09/27/2018 PCP: [name], FNP  Consulting Physician: Dr. Janeece Riggers, ENT  Reason for consult: Medical management.  HPI/Recap of past 6 hours: 73 year old with history of GERD, essential hypertension, hyperlipidemia who recently diagnosed neck mass concerning for malignancy was admitted to the hospital.  Overnight patient went into SVT therefore medical team was consulted.  Patient was given adenosine x1 which converted back to normal sinus rhythm.  G-tube placed by surgery 7/13, tolerating tube feeds.  Metoprolol via G tube twice daily started.  Hospital course complicated by watery stools, nausea and vomiting, leukocytosis on 10/10/2018.  GI panel ordered and pending.  Due to vomiting was started on IV levofloxacin by primary team.  On IV fluid normal saline at 75 cc/h to prevent dehydration.  10/11/18: Patient was seen and examined at his bedside.  He reports persistent watery stools, nausea with no vomiting this morning.  To note patient vomited yesterday.  Worsening leukocytosis, GI panel is pending.     Assessment/Plan: Principal Problem:   Squamous cell carcinoma of glottis (HCC) Active Problems:   Laryngeal mass   Goals of care, counseling/discussion   Dysphagia   Loss of weight   PSVT (paroxysmal supraventricular tachycardia) (HCC)   Protein-calorie malnutrition, severe  Paroxysmal supraventricular tachycardia Reviewed vital signs and they are stable at this time Electrolytes are normal TSH is normal Increase dose of metoprolol to 25 mg twice daily via G-tube.   Persistent diarrhea T-max 99.6 overnight Worsening leukocytosis wbc 23k from 21k Procalcitonin and lactic acid negative on 10/11/2018 GI panel is pending Continue enteric precautions  Hypovolemic hyponatremia Sodium 129 from 132 yesterday from 138 previously DC D5 half-normal saline and replaced by normal saline at 75 cc/h x 1 day Repeat  BMP in the morning  Hyperglycemia suspect iatrogenic in the setting of D5W Continue to monitor Avoid hypoglycemia  Dysphagia secondary to laryngeal cancer status post tracheostomy Trach in place.  Status post G-tube placement on 10/07/2018 Continue Robinul as needed for secretions    Thank you for allowing [place] to participate in the care of this patient.  [facility] will follow with you.  Objective: Vitals:   10/11/18 0911 10/11/18 0952 10/11/18 1130 10/11/18 1210  BP:  130/62 (!) 105/55   Pulse: (!) 105 (!) 103 91 93  Resp: (!) 26  (!) 24 (!) 25  Temp:      TempSrc:      SpO2: 96%   90%  Weight:      Height:        Intake/Output Summary (Last 24 hours) at 10/11/2018 1220 Last data filed at 10/11/2018 0618 Gross per 24 hour  Intake 3513.67 ml  Output -  Net 3513.67 ml   Filed Weights   10/09/18 0410 10/10/18 0500 10/11/18 0500  Weight: 60.8 kg 64.5 kg 68 kg    Exam:  . General: 73 y.o. year-old male built in no acute distress.  Alert and oriented x3.. . Cardiovascular: Regular rate and rhythm no rubs or gallops.  No JVD or thyromegaly. Marland Kitchen Respiratory: Trach collar in place.  Diffuse rales noted.  Poor respiratory effort.  . Abdomen: Soft nontender hypoactive bowel sounds.  PEG tube in place.  . Musculoskeletal: No extremity edema.  2 out of 4 pulses in all 4 extremities. Marland Kitchen Psychiatry: Mood is appropriate for condition and setting.  Data Reviewed: CBC: Recent Labs  Lab 10/10/18 0603 10/11/18 0416  WBC 21.2* 23.7*  NEUTROABS 19.1* 20.8*  HGB 11.6* 11.2*  HCT 36.1* 34.3*  MCV 88.7 87.5  PLT 328 305   Basic Metabolic Panel: Recent Labs  Lab 10/06/18 1648 10/07/18 0447 10/07/18 1621 10/08/18 0654 10/09/18 0359 10/10/18 0603 10/11/18 0416  NA  --  135  --  136 138 132* 129*  K  --  5.1  --  4.2 4.6 4.1 4.6  CL  --  100  --  102 103 94* 94*  CO2  --  28  --  27 26 26 24   GLUCOSE  --  120*  --  131* 114* 161* 150*  BUN  --  7*  --  8 10 14 18   CREATININE  --   0.77  --  0.77 0.67 0.67 0.94  CALCIUM  --  8.8*  --  8.6* 8.8* 8.9 8.7*  MG 1.7 1.8 1.6* 1.6* 1.8 1.5* 2.6*  PHOS 4.1 4.1  --   --  4.1 3.5 5.2*   GFR: Estimated Creatinine Clearance: 69.3 mL/min (by C-G formula based on SCr of 0.94 mg/dL). Liver Function Tests: No results for input(s): AST, ALT, ALKPHOS, BILITOT, PROT, ALBUMIN in the last 168 hours. No results for input(s): LIPASE, AMYLASE in the last 168 hours. No results for input(s): AMMONIA in the last 168 hours. Coagulation Profile: No results for input(s): INR, PROTIME in the last 168 hours. Cardiac Enzymes: No results for input(s): CKTOTAL, CKMB, CKMBINDEX, TROPONINI in the last 168 hours. BNP (last 3 results) No results for input(s): PROBNP in the last 8760 hours. HbA1C: No results for input(s): HGBA1C in the last 72 hours. CBG: Recent Labs  Lab 10/10/18 2055 10/10/18 2357 10/11/18 0419 10/11/18 0840 10/11/18 1152  GLUCAP 122* 135* 128* 134* 174*   Lipid Profile: No results for input(s): CHOL, HDL, LDLCALC, TRIG, CHOLHDL, LDLDIRECT in the last 72 hours. Thyroid Function Tests: No results for input(s): TSH, T4TOTAL, FREET4, T3FREE, THYROIDAB in the last 72 hours. Anemia Panel: No results for input(s): VITAMINB12, FOLATE, FERRITIN, TIBC, IRON, RETICCTPCT in the last 72 hours. Urine analysis:    Component Value Date/Time   COLORURINE YELLOW 03/30/2015 1330   APPEARANCEUR CLEAR 03/30/2015 1330   LABSPEC >1.030 (H) 03/30/2015 1330   PHURINE 5.5 03/30/2015 1330   GLUCOSEU NEGATIVE 03/30/2015 1330   HGBUR TRACE (A) 03/30/2015 1330   BILIRUBINUR NEGATIVE 03/30/2015 1330   KETONESUR NEGATIVE 03/30/2015 1330   PROTEINUR NEGATIVE 03/30/2015 1330   NITRITE NEGATIVE 03/30/2015 1330   LEUKOCYTESUR NEGATIVE 03/30/2015 1330   Sepsis Labs: @LABRCNTIP (procalcitonin:4,lacticidven:4)  ) Recent Results (from the past 240 hour(s))  SARS Coronavirus 2 (CEPHEID - Performed in [facility] hospital lab), Hosp Order     Status:  None   Collection Time: 10/02/18 11:06 AM   Specimen: Nasopharyngeal Swab  Result Value Ref Range Status   SARS Coronavirus 2 NEGATIVE NEGATIVE Final    Comment: (NOTE) If result is NEGATIVE SARS-CoV-2 target nucleic acids are NOT DETECTED. The SARS-CoV-2 RNA is generally detectable in upper and lower  respiratory specimens during the acute phase of infection. The lowest  concentration of SARS-CoV-2 viral copies this assay can detect is 250  copies / mL. A negative result does not preclude SARS-CoV-2 infection  and should not be used as the sole basis for treatment or other  patient management decisions.  A negative result may occur with  improper specimen collection / handling, submission of specimen other  than nasopharyngeal swab, presence of viral mutation(s) within the  areas targeted by this assay, and inadequate  number of viral copies  (<250 copies / mL). A negative result must be combined with clinical  observations, patient history, and epidemiological information. If result is POSITIVE SARS-CoV-2 target nucleic acids are DETECTED. The SARS-CoV-2 RNA is generally detectable in upper and lower  respiratory specimens dur ing the acute phase of infection.  Positive  results are indicative of active infection with SARS-CoV-2.  Clinical  correlation with patient history and other diagnostic information is  necessary to determine patient infection status.  Positive results do  not rule out bacterial infection or co-infection with other viruses. If result is PRESUMPTIVE POSTIVE SARS-CoV-2 nucleic acids MAY BE PRESENT.   A presumptive positive result was obtained on the submitted specimen  and confirmed on repeat testing.  While 2019 novel coronavirus  (SARS-CoV-2) nucleic acids may be present in the submitted sample  additional confirmatory testing may be necessary for epidemiological  and / or clinical management purposes  to differentiate between  SARS-CoV-2 and other  Sarbecovirus currently known to infect humans.  If clinically indicated additional testing with an alternate test  methodology [phone]) is advised. The SARS-CoV-2 RNA is generally  detectable in upper and lower respiratory sp ecimens during the acute  phase of infection. The expected result is Negative. Fact Sheet for Patients:  BoilerBrush.com.cy Fact Sheet for Healthcare Providers: https://pope.com/ This test is not yet approved or cleared by the [place] FDA and has been authorized for detection and/or diagnosis of SARS-CoV-2 by FDA under an Emergency Use Authorization (EUA).  This EUA will remain in effect (meaning this test can be used) for the duration of the COVID-19 declaration under Section 564(b)(1) of the Act, 21 U.S.C. section 360bbb-3(b)(1), unless the authorization is terminated or revoked sooner. Performed at [facility], [address], [place], [place] [address]       Studies: No results found.  Scheduled Meds: . chlorhexidine  15 mL Mouth Rinse BID  . doxazosin  1 mg Per Tube Daily  . feeding supplement (OSMOLITE 1.5 CAL)  237 mL Per Tube 6 X Daily  . feeding supplement (PRO-STAT SUGAR FREE 64)  30 mL Per Tube BID  . hydrocortisone   Rectal QID  . lidocaine  1 application Urethral Once  . mouth rinse  15 mL Mouth Rinse q12n4p  . metoprolol tartrate  25 mg Per Tube BID  . simethicone  160 mg Per Tube QID    Continuous Infusions: . sodium chloride 75 mL/hr at 10/11/18 0615  . lactated ringers 10 mL/hr at 10/07/18 0859  . levofloxacin (LEVAQUIN) IV 750 mg (10/11/18 1059)     LOS: 14 days     Darlin Drop, MD [facility] Pager [phone]  If 7PM-7AM, please contact night-coverage [email or web address] Password [email or web address] 10/11/2018, 12:20 PM

## 2018-10-11 NOTE — Progress Notes (Signed)
Subjective: Resting in bed. Pt has significant greenish secretion from the trach. Vomited yesterday. Watery stool.  Objective: Vital signs in last 24 hours: Temp:  [97.9 F (36.6 C)-99.6 F (37.6 C)] 97.9 F (36.6 C) (07/17 0615) Pulse Rate:  [101-113] 105 (07/17 0911) Resp:  [20-26] 26 (07/17 0911) BP: (120-143)/(54-71) 120/54 (07/17 0615) SpO2:  [87 %-98 %] 96 % (07/17 0911) FiO2 (%):  [21 %-35 %] 35 % (07/17 0911) Weight:  [68 kg] 68 kg (07/17 0500)  General appearance:alert, cooperative and no distress Head:Normocephalic, without obvious abnormality, atraumatic Eyes:conjunctivae/corneas clear. PERRL, EOM's intact.  Ears:normal TM's and external ear canals both ears Nose:Nares normal. Septum midline. Mucosa normal. No drainage or sinus tenderness. Throat:lips, mucosa, and tongue normal; Neck:Trach midline. No bleeding. Copious secretion. Neurologic:Grossly normal Abdomen: G-tube in place, no erythema.  Recent Labs    10/10/18 0603 10/11/18 0416  WBC 21.2* 23.7*  HGB 11.6* 11.2*  HCT 36.1* 34.3*  PLT 328 305   Recent Labs    10/10/18 0603 10/11/18 0416  NA 132* 129*  K 4.1 4.6  CL 94* 94*  CO2 26 24  GLUCOSE 161* 150*  BUN 14 18  CREATININE 0.67 0.94  CALCIUM 8.9 8.7*    Medications:  I have reviewed the patient's current medications. Scheduled: . chlorhexidine  15 mL Mouth Rinse BID  . doxazosin  1 mg Per Tube Daily  . feeding supplement (OSMOLITE 1.5 CAL)  237 mL Per Tube 6 X Daily  . feeding supplement (PRO-STAT SUGAR FREE 64)  30 mL Per Tube BID  . hydrocortisone   Rectal QID  . lidocaine  1 application Urethral Once  . mouth rinse  15 mL Mouth Rinse q12n4p  . metoprolol tartrate  25 mg Per Tube BID  . simethicone  160 mg Per Tube QID   Continuous: . sodium chloride 75 mL/hr at 10/11/18 0615  . lactated ringers 10 mL/hr at 10/07/18 0859   IPP:[id number] injection, ondansetron (ZOFRAN) IV, promethazine  Assessment/Plan: Laryngeal SCCA,  s/p trachand G-tube placement - Appreciate hospitalist's assistance in pt's medical management. - Diarrhea. Slightly better with simethicone. - On Doxazosin for presumed BPH and voiding difficulty. -Neck CT shows extensive laryngeal/pharyngeal/esophagealSCCA. -Now has anuncuffed#[id number]. Lurline Idol and G-tube care teaching. - Elevated WBC with increased trach secretion. Will start pt on levofloxacin.    LOS: 14 days   Shanley Furlough W Jarvis Knodel 10/11/2018, 9:27 AM

## 2018-10-12 DIAGNOSIS — E871 Hypo-osmolality and hyponatremia: Secondary | ICD-10-CM

## 2018-10-12 DIAGNOSIS — E43 Unspecified severe protein-calorie malnutrition: Secondary | ICD-10-CM

## 2018-10-12 DIAGNOSIS — R1312 Dysphagia, oropharyngeal phase: Secondary | ICD-10-CM

## 2018-10-12 DIAGNOSIS — C320 Malignant neoplasm of glottis: Secondary | ICD-10-CM

## 2018-10-12 LAB — BLOOD CULTURE ID PANEL (REFLEXED)

## 2018-10-12 LAB — BASIC METABOLIC PANEL
Anion gap: 9 (ref 5–15)
BUN: 27 mg/dL — ABNORMAL HIGH (ref 8–23)
CO2: 24 mmol/L (ref 22–32)
Calcium: 8.1 mg/dL — ABNORMAL LOW (ref 8.9–10.3)
Chloride: 96 mmol/L — ABNORMAL LOW (ref 98–111)
Creatinine, Ser: 1.19 mg/dL (ref 0.61–1.24)
GFR calc Af Amer: >60 mL/min (ref >60)
GFR calc non Af Amer: >60 mL/min (ref >60)
Glucose, Bld: 144 mg/dL — ABNORMAL HIGH (ref 70–99)
Potassium: 4.5 mmol/L (ref 3.5–5.1)
Sodium: 129 mmol/L — ABNORMAL LOW (ref 135–145)

## 2018-10-12 LAB — MAGNESIUM: Magnesium: 2 mg/dL (ref 1.7–2.4)

## 2018-10-12 LAB — GLUCOSE, CAPILLARY
Glucose-Capillary: 134 mg/dL — ABNORMAL HIGH (ref 70–99)
Glucose-Capillary: 166 mg/dL — ABNORMAL HIGH (ref 70–99)
Glucose-Capillary: 154 mg/dL — ABNORMAL HIGH (ref 70–99)
Glucose-Capillary: 149 mg/dL — ABNORMAL HIGH (ref 70–99)
Glucose-Capillary: 127 mg/dL — ABNORMAL HIGH (ref 70–99)

## 2018-10-12 LAB — PHOSPHORUS: Phosphorus: 4 mg/dL (ref 2.5–4.6)

## 2018-10-12 MED ORDER — LACTATED RINGERS IV BOLUS
500.0000 mL | Freq: Once | INTRAVENOUS | Status: AC
  Administered 2018-10-12: 500 mL via INTRAVENOUS

## 2018-10-12 MED ORDER — SODIUM CHLORIDE 0.9 % IV SOLN
2.0000 g | Freq: Two times a day (BID) | INTRAVENOUS | Status: DC
  Administered 2018-10-12 – 2018-10-13 (×3): 2 g via INTRAVENOUS
  Filled 2018-10-12 (×4): qty 2

## 2018-10-12 MED ORDER — SODIUM CHLORIDE 0.9 % IV SOLN
INTRAVENOUS | Status: DC
  Administered 2018-10-12 – 2018-10-18 (×10): via INTRAVENOUS

## 2018-10-12 NOTE — Progress Notes (Signed)
PHARMACY - PHYSICIAN COMMUNICATION CRITICAL VALUE ALERT - BLOOD CULTURE IDENTIFICATION (BCID)  Logan Price is an 73 y.o. male who presented to [facility] on 09/27/2018 with a chief complaint of throat pain/mass  Assessment: Increasing WBC, mild fever  Name of physician (or Provider) Contacted: X Blount ([facility])  Current antibiotics: Levaquin  Changes to prescribed antibiotics recommended:  DC Levaquin Start Cefepime 2g IV q12h  Results for orders placed or performed during the hospital encounter of 09/27/18  Blood Culture ID Panel (Reflexed) (Collected: 10/11/2018  6:47 AM)  Result Value Ref Range   Enterococcus species NOT DETECTED NOT DETECTED   Listeria monocytogenes NOT DETECTED NOT DETECTED   Staphylococcus species NOT DETECTED NOT DETECTED   Staphylococcus aureus (BCID) NOT DETECTED NOT DETECTED   Streptococcus species NOT DETECTED NOT DETECTED   Streptococcus agalactiae NOT DETECTED NOT DETECTED   Streptococcus pneumoniae NOT DETECTED NOT DETECTED   Streptococcus pyogenes NOT DETECTED NOT DETECTED   Acinetobacter baumannii NOT DETECTED NOT DETECTED   Enterobacteriaceae species NOT DETECTED NOT DETECTED   Enterobacter cloacae complex NOT DETECTED NOT DETECTED   Escherichia coli NOT DETECTED NOT DETECTED   Klebsiella oxytoca NOT DETECTED NOT DETECTED   Klebsiella pneumoniae NOT DETECTED NOT DETECTED   Proteus species NOT DETECTED NOT DETECTED   Serratia marcescens NOT DETECTED NOT DETECTED   Carbapenem resistance NOT DETECTED NOT DETECTED   Haemophilus influenzae NOT DETECTED NOT DETECTED   Neisseria meningitidis NOT DETECTED NOT DETECTED   Pseudomonas aeruginosa DETECTED (A) NOT DETECTED   Candida albicans NOT DETECTED NOT DETECTED   Candida glabrata NOT DETECTED NOT DETECTED   Candida krusei NOT DETECTED NOT DETECTED   Candida parapsilosis NOT DETECTED NOT DETECTED   Candida tropicalis NOT DETECTED NOT DETECTED    Narda Bonds 10/12/2018  4:39 AM

## 2018-10-12 NOTE — Progress Notes (Addendum)
PROGRESS NOTE    Logan Price  NFA:[id number]  DOB: 1945-09-06  DOA: 09/27/2018 PCP: [name], FNP  Medical consult follow-up: 73 year old male with history of GERD, hypertension, hyperlipidemia admitted to ENT service for laryngeal cancer, now status post surgery/trach/G-tube placement.  Hospital course complicated by SVT, diarrhea, nausea, vomiting, leukocytosis and now with positive blood cultures for Pseudomonas.  Patient initially on IV Levaquin and overnight changed to IV cefepime.  He has remained on IV fluids during the hospital course.  Now receiving tube feeds via PEG.  GI viral panel negative.  Subjective:  Patient resting comfortably.  Copious tracheal secretions per nurse.  Blood cultures reported 2 out of 2 bottles growing gram-negative rods.  Chest x-ray yesterday reported ?right IJ Port-A-Cath .  No diarrhea today.  Objective: Vitals:   10/12/18 1125 10/12/18 1254 10/12/18 1510 10/12/18 1704  BP:  (!) 145/71  (!) 123/56  Pulse: 91 92 (!) 106 (!) 110  Resp:  (!) 28 20   Temp:  99 F (37.2 C)  99.6 F (37.6 C)  TempSrc:  Oral  Oral  SpO2: 93% 95% 96% 93%  Weight:      Height:        Intake/Output Summary (Last 24 hours) at 10/12/2018 1901 Last data filed at 10/12/2018 0500 Gross per 24 hour  Intake 1530.78 ml  Output 750 ml  Net 780.78 ml   Filed Weights   10/10/18 0500 10/11/18 0500 10/12/18 0500  Weight: 64.5 kg 68 kg 68 kg    Physical Examination:  General exam: Appears calm and, status post trach, self suctioning Respiratory system: Clear to auscultation. Respiratory effort normal. Cardiovascular system: S1 & S2 heard, tachycardic. No JVD, murmurs, rubs, gallops or clicks. No pedal edema.  Gastrointestinal system: Abdomen is nondistended, soft and nontender. No organomegaly or masses felt. Normal bowel sounds heard. Central nervous system: Alert and oriented. No focal neurological deficits. Extremities: Symmetric 5 x 5 power. Skin: No rashes,  lesions or ulcers Psychiatry: Judgement and insight appear normal. Mood & affect appropriate.     Data Reviewed: I have personally reviewed following labs and imaging studies  CBC: Recent Labs  Lab 10/10/18 0603 10/11/18 0416  WBC 21.2* 23.7*  NEUTROABS 19.1* 20.8*  HGB 11.6* 11.2*  HCT 36.1* 34.3*  MCV 88.7 87.5  PLT 328 305   Basic Metabolic Panel: Recent Labs  Lab 10/07/18 0447  10/08/18 0654 10/09/18 0359 10/10/18 0603 10/11/18 0416 10/12/18 0226  NA 135  --  136 138 132* 129* 129*  K 5.1  --  4.2 4.6 4.1 4.6 4.5  CL 100  --  102 103 94* 94* 96*  CO2 28  --  27 26 26 24 24   GLUCOSE 120*  --  131* 114* 161* 150* 144*  BUN 7*  --  8 10 14 18  27*  CREATININE 0.77  --  0.77 0.67 0.67 0.94 1.19  CALCIUM 8.8*  --  8.6* 8.8* 8.9 8.7* 8.1*  MG 1.8   < > 1.6* 1.8 1.5* 2.6* 2.0  PHOS 4.1  --   --  4.1 3.5 5.2* 4.0   < > = values in this interval not displayed.   GFR: Estimated Creatinine Clearance: 54.8 mL/min (by C-G formula based on SCr of 1.19 mg/dL). Liver Function Tests: No results for input(s): AST, ALT, ALKPHOS, BILITOT, PROT, ALBUMIN in the last 168 hours. No results for input(s): LIPASE, AMYLASE in the last 168 hours. No results for input(s): AMMONIA in the  last 168 hours. Coagulation Profile: No results for input(s): INR, PROTIME in the last 168 hours. Cardiac Enzymes: No results for input(s): CKTOTAL, CKMB, CKMBINDEX, TROPONINI in the last 168 hours. BNP (last 3 results) No results for input(s): PROBNP in the last 8760 hours. HbA1C: No results for input(s): HGBA1C in the last 72 hours. CBG: Recent Labs  Lab 10/11/18 2353 10/12/18 0431 10/12/18 0755 10/12/18 1137 10/12/18 1655  GLUCAP 157* 134* 166* 154* 149*   Lipid Profile: No results for input(s): CHOL, HDL, LDLCALC, TRIG, CHOLHDL, LDLDIRECT in the last 72 hours. Thyroid Function Tests: No results for input(s): TSH, T4TOTAL, FREET4, T3FREE, THYROIDAB in the last 72 hours. Anemia Panel: No  results for input(s): VITAMINB12, FOLATE, FERRITIN, TIBC, IRON, RETICCTPCT in the last 72 hours. Sepsis Labs: Recent Labs  Lab 10/11/18 0648  PROCALCITON <0.10  LATICACIDVEN 1.5    Recent Results (from the past 240 hour(s))  Gastrointestinal Panel by PCR , Stool     Status: None   Collection Time: 10/10/18  1:33 AM   Specimen: Stool  Result Value Ref Range Status   Campylobacter species NOT DETECTED NOT DETECTED Final   Plesimonas shigelloides NOT DETECTED NOT DETECTED Final   Salmonella species NOT DETECTED NOT DETECTED Final   Yersinia enterocolitica NOT DETECTED NOT DETECTED Final   Vibrio species NOT DETECTED NOT DETECTED Final   Vibrio cholerae NOT DETECTED NOT DETECTED Final   Enteroaggregative E coli (EAEC) NOT DETECTED NOT DETECTED Final   Enteropathogenic E coli (EPEC) NOT DETECTED NOT DETECTED Final   Enterotoxigenic E coli (ETEC) NOT DETECTED NOT DETECTED Final   Shiga like toxin producing E coli (STEC) NOT DETECTED NOT DETECTED Final   Shigella/Enteroinvasive E coli (EIEC) NOT DETECTED NOT DETECTED Final   Cryptosporidium NOT DETECTED NOT DETECTED Final   Cyclospora cayetanensis NOT DETECTED NOT DETECTED Final   Entamoeba histolytica NOT DETECTED NOT DETECTED Final   Giardia lamblia NOT DETECTED NOT DETECTED Final   Adenovirus F40/41 NOT DETECTED NOT DETECTED Final   Astrovirus NOT DETECTED NOT DETECTED Final   Norovirus GI/GII NOT DETECTED NOT DETECTED Final   Rotavirus A NOT DETECTED NOT DETECTED Final   Sapovirus (I, II, IV, and V) NOT DETECTED NOT DETECTED Final    Comment: Performed at [facility], [address] [place], [place] [address]  Culture, blood (routine x 2)     Status: None (Preliminary result)   Collection Time: 10/11/18  6:47 AM   Specimen: BLOOD LEFT HAND  Result Value Ref Range Status   Specimen Description BLOOD LEFT HAND  Final   Special Requests   Final    BOTTLES DRAWN AEROBIC ONLY Blood Culture results may not be optimal due  to an inadequate volume of blood received in culture bottles   Culture  Setup Time   Final    AEROBIC BOTTLE ONLY GRAM NEGATIVE RODS Organism ID to follow CRITICAL RESULT CALLED TO, READ BACK BY AND VERIFIED WITH: Melven Sartorius [facility] 10/12/18 0425 JDW Performed at [facility], [address] [place], [place] [address]    Culture GRAM NEGATIVE RODS  Final   Report Status PENDING  Incomplete  Blood Culture ID Panel (Reflexed)     Status: Abnormal   Collection Time: 10/11/18  6:47 AM  Result Value Ref Range Status   Enterococcus species NOT DETECTED NOT DETECTED Final   Listeria monocytogenes NOT DETECTED NOT DETECTED Final   Staphylococcus species NOT DETECTED NOT DETECTED Final   Staphylococcus aureus (BCID) NOT  DETECTED NOT DETECTED Final   Streptococcus species NOT DETECTED NOT DETECTED Final   Streptococcus agalactiae NOT DETECTED NOT DETECTED Final   Streptococcus pneumoniae NOT DETECTED NOT DETECTED Final   Streptococcus pyogenes NOT DETECTED NOT DETECTED Final   Acinetobacter baumannii NOT DETECTED NOT DETECTED Final   Enterobacteriaceae species NOT DETECTED NOT DETECTED Final   Enterobacter cloacae complex NOT DETECTED NOT DETECTED Final   Escherichia coli NOT DETECTED NOT DETECTED Final   Klebsiella oxytoca NOT DETECTED NOT DETECTED Final   Klebsiella pneumoniae NOT DETECTED NOT DETECTED Final   Proteus species NOT DETECTED NOT DETECTED Final   Serratia marcescens NOT DETECTED NOT DETECTED Final   Carbapenem resistance NOT DETECTED NOT DETECTED Final   Haemophilus influenzae NOT DETECTED NOT DETECTED Final   Neisseria meningitidis NOT DETECTED NOT DETECTED Final   Pseudomonas aeruginosa DETECTED (A) NOT DETECTED Final    Comment: CRITICAL RESULT CALLED TO, READ BACK BY AND VERIFIED WITH: J LEDFORD PHARMD 10/12/18 0425 JDW    Candida albicans NOT DETECTED NOT DETECTED Final   Candida glabrata NOT DETECTED NOT DETECTED Final   Candida krusei NOT DETECTED NOT DETECTED  Final   Candida parapsilosis NOT DETECTED NOT DETECTED Final   Candida tropicalis NOT DETECTED NOT DETECTED Final    Comment: Performed at [facility], [address], [place], [place] [address]  Culture, blood (routine x 2)     Status: None (Preliminary result)   Collection Time: 10/11/18  7:45 AM   Specimen: BLOOD  Result Value Ref Range Status   Specimen Description BLOOD RIGHT ANTECUBITAL  Final   Special Requests   Final    BOTTLES DRAWN AEROBIC ONLY Blood Culture adequate volume   Culture  Setup Time   Final    AEROBIC BOTTLE ONLY GRAM NEGATIVE RODS CRITICAL VALUE NOTED.  VALUE IS CONSISTENT WITH PREVIOUSLY REPORTED AND CALLED VALUE. Performed at [facility], [address] [place], [place] [address]    Culture GRAM NEGATIVE RODS  Final   Report Status PENDING  Incomplete      Radiology Studies: Dg Chest Port 1 View  Result Date: 10/11/2018 CLINICAL DATA:  Nausea and vomiting yesterday.  New tracheostomy. EXAM: PORTABLE CHEST 1 VIEW COMPARISON:  Chest CT 09/29/2018 FINDINGS: Tracheostomy tube appears in adequate position. Right IJ Port-A-Cath has tip over the SVC. Lungs are adequately inflated without focal airspace consolidation or effusion. No pneumothorax. Cardiomediastinal silhouette is within normal. Dense radiopaque material/calcifications over the posteromedial right lower lobe as seen on CT scan. Remainder of the exam is unchanged. IMPRESSION: No acute cardiopulmonary disease. Tracheostomy tube and right IJ Port-A-Cath in adequate position. Electronically Signed   By: Elberta Fortis M.D.   On: 10/11/2018 15:40   Dg Abd Portable 1v  Result Date: 10/11/2018 CLINICAL DATA:  New tracheostomy tube. Nausea and vomiting yesterday. EXAM: PORTABLE ABDOMEN - 1 VIEW COMPARISON:  10/05/2018 FINDINGS: Percutaneous gastrostomy tube projects over the stomach in the left upper quadrant. Bowel gas pattern is nonobstructive. No free peritoneal air. Remainder of the exam is  unchanged. IMPRESSION: Nonobstructive bowel gas pattern. Gastrostomy tube projects over the stomach in the left upper quadrant. Electronically Signed   By: Elberta Fortis M.D.   On: 10/11/2018 15:41        Scheduled Meds: . chlorhexidine  15 mL Mouth Rinse BID  . doxazosin  1 mg Per Tube Daily  . feeding supplement (OSMOLITE 1.5 CAL)  237 mL Per Tube 6 X Daily  . feeding  supplement (PRO-STAT SUGAR FREE 64)  30 mL Per Tube BID  . hydrocortisone   Rectal QID  . lidocaine  1 application Urethral Once  . mouth rinse  15 mL Mouth Rinse q12n4p  . metoprolol tartrate  25 mg Per Tube BID  . simethicone  160 mg Per Tube QID   Continuous Infusions: . ceFEPime (MAXIPIME) IV Stopped (10/12/18 0529)  . lactated ringers 10 mL/hr at 10/07/18 0859    Assessment & Plan:    1.  Pseudomonas bacteremia: Source could be tracheitis versus line infection.  Patient also has significant leukocytosis with T-max of 100.1.  Lactate 1.5.  Continue IV cefepime.    Will send tracheal aspirate for cultures.  Recommend consult ID in a.m.  2.  SVT: Converted back with adenosine x1.  TSH within normal limits.  Continue metoprolol.  Keep potassium close to 4 and magnesium to 2  3.  Hyponatremia: SIADH versus dehydration versus dilutional.  Patient was receiving hypotonic fluids (D5 half-normal saline) which is discontinued.  Patient received a bolus of lactated Ringer's last night for hypotension.  Will maintain on IV hydration given persistent tachycardia.  Labs in a.m.  Free water via PEG  4.  Nausea/vomiting: Unclear etiology.  On tube feeds via PEG.  Abdominal x-ray unremarkable on 7/17.  PRN meds available.  Also on simethicone 4 times daily  5.  Diarrhea: Could be related to tube feeds.  Resolving.  GI panel negative.  6.  Mild hyperglycemia: Likely tube feed induced.  Sliding scale insulin.  Off dextrose fluids now.  7.  Laryngeal CA: S/p trach and status post PEG for dysphagia.  Defer Mx to primary service.   Disposition Plan: Per primary service, SNF      LOS: 15 days    Time spent: 35 minutes    Alessandra Bevels, MD [facility] Pager [phone]  If 7PM-7AM, please contact night-coverage [email or web address] Password [email or web address] 10/12/2018, 7:01 PM

## 2018-10-12 NOTE — Progress Notes (Signed)
Subjective: Sleeping comfortably in bed. Emesis x1 last night after med. Tolerating tube feed yesterday. Positive blood culture for pseudomonas.  Objective: Vital signs in last 24 hours: Temp:  [98.3 F (36.8 C)-100.1 F (37.8 C)] 100.1 F (37.8 C) (07/18 0435) Pulse Rate:  [91-110] 94 (07/18 0741) Resp:  [12-30] 19 (07/18 0741) BP: (89-137)/(45-62) 137/54 (07/18 0656) SpO2:  [90 %-97 %] 97 % (07/18 0741) FiO2 (%):  [21 %-35 %] 28 % (07/18 0741) Weight:  [68 kg] 68 kg (07/18 0500)  General appearance:NAD.  Head:Normocephalic, without obvious abnormality, atraumatic Eyes:conjunctivae/corneas clear. PERRL, EOM's intact.  Ears:normal TM's and external ear canals both ears Nose:Nares normal. Septum midline. Mucosa normal. No drainage or sinus tenderness. Throat:lips, mucosa, and tongue normal; Neck:Trach midline. No bleeding.  Neurologic:Grossly normal Abdomen: G-tube in place, no erythema.   Recent Labs    10/10/18 0603 10/11/18 0416  WBC 21.2* 23.7*  HGB 11.6* 11.2*  HCT 36.1* 34.3*  PLT 328 305   Recent Labs    10/11/18 0416 10/12/18 0226  NA 129* 129*  K 4.6 4.5  CL 94* 96*  CO2 24 24  GLUCOSE 150* 144*  BUN 18 27*  CREATININE 0.94 1.19  CALCIUM 8.7* 8.1*    Medications:  I have reviewed the patient's current medications. Scheduled: . chlorhexidine  15 mL Mouth Rinse BID  . doxazosin  1 mg Per Tube Daily  . feeding supplement (OSMOLITE 1.5 CAL)  237 mL Per Tube 6 X Daily  . feeding supplement (PRO-STAT SUGAR FREE 64)  30 mL Per Tube BID  . hydrocortisone   Rectal QID  . lidocaine  1 application Urethral Once  . mouth rinse  15 mL Mouth Rinse q12n4p  . metoprolol tartrate  25 mg Per Tube BID  . simethicone  160 mg Per Tube QID   Continuous: . ceFEPime (MAXIPIME) IV Stopped (10/12/18 0529)  . lactated ringers 10 mL/hr at 10/07/18 0859    Assessment/Plan: Laryngeal SCCA, s/p trachand G-tube placement - Elevated WBC and positive blood  culture. Abx switched to cefepime per hospitalist. - Appreciate hospitalist's assistance in pt's medical management. - Diarrhea. Improved. - On Doxazosin for presumed BPH and voiding difficulty. -Neck CT shows extensive laryngeal/pharyngeal/esophagealSCCA. -Now has anuncuffed#[id number]. Lurline Idol and G-tube care teaching. - Pt's home health is not ready until Monday.   LOS: 15 days   Artez Regis W Etoy Mcdonnell 10/12/2018, 7:45 AM

## 2018-10-12 NOTE — Progress Notes (Signed)
OT Cancellation Note  Patient Details Name: Logan Price MRN: [id number] DOB: 1945/08/27   Cancelled Treatment:    Reason Eval/Treat Not Completed: Patient declined, no reason specified. Pt declining OOB activity, bed mobility, and exercises. Pt stating "I just want to go home. I keep getting sicker and sicker." Educated pt on benefits and importance of OOB activity. Continued to decline. Will return as schedule allows.  B[place], OTR/L Acute Rehab Pager: [phone] Office: [phone] 10/12/2018, 2:47 PM

## 2018-10-12 NOTE — Plan of Care (Signed)
Will continue to monitor.

## 2018-10-12 NOTE — Progress Notes (Addendum)
Pt w/ soft BPs this AM, 91/46 and 89/45. Axillary temp of 100.1 (unable to obtain oral as pt was asleep w/ mouth open, probe unable to sense a temp). Noted positive blood culture for pseudomonas; started cefepime. [place] NP notified and received order for 578mL bolus of LR to run over an hour. Currently running. Will reassess when bolus is completed.   1696: BP recheck 137/54

## 2018-10-13 DIAGNOSIS — Z930 Tracheostomy status: Secondary | ICD-10-CM

## 2018-10-13 DIAGNOSIS — K219 Gastro-esophageal reflux disease without esophagitis: Secondary | ICD-10-CM

## 2018-10-13 DIAGNOSIS — C320 Malignant neoplasm of glottis: Secondary | ICD-10-CM

## 2018-10-13 DIAGNOSIS — I10 Essential (primary) hypertension: Secondary | ICD-10-CM

## 2018-10-13 DIAGNOSIS — Z931 Gastrostomy status: Secondary | ICD-10-CM

## 2018-10-13 DIAGNOSIS — J0410 Acute tracheitis without obstruction: Secondary | ICD-10-CM

## 2018-10-13 DIAGNOSIS — R1312 Dysphagia, oropharyngeal phase: Secondary | ICD-10-CM

## 2018-10-13 DIAGNOSIS — T80211A Bloodstream infection due to central venous catheter, initial encounter: Secondary | ICD-10-CM

## 2018-10-13 DIAGNOSIS — B965 Pseudomonas (aeruginosa) (mallei) (pseudomallei) as the cause of diseases classified elsewhere: Secondary | ICD-10-CM

## 2018-10-13 DIAGNOSIS — R7881 Bacteremia: Secondary | ICD-10-CM

## 2018-10-13 DIAGNOSIS — Z87891 Personal history of nicotine dependence: Secondary | ICD-10-CM

## 2018-10-13 DIAGNOSIS — E785 Hyperlipidemia, unspecified: Secondary | ICD-10-CM

## 2018-10-13 LAB — BASIC METABOLIC PANEL
Anion gap: 11 (ref 5–15)
BUN: 54 mg/dL — ABNORMAL HIGH (ref 8–23)
CO2: 21 mmol/L — ABNORMAL LOW (ref 22–32)
Calcium: 8 mg/dL — ABNORMAL LOW (ref 8.9–10.3)
Chloride: 100 mmol/L (ref 98–111)
Creatinine, Ser: 3.63 mg/dL — ABNORMAL HIGH (ref 0.61–1.24)
GFR calc Af Amer: 18 mL/min — ABNORMAL LOW (ref >60)
GFR calc non Af Amer: 16 mL/min — ABNORMAL LOW (ref >60)
Glucose, Bld: 174 mg/dL — ABNORMAL HIGH (ref 70–99)
Potassium: 4.7 mmol/L (ref 3.5–5.1)
Sodium: 132 mmol/L — ABNORMAL LOW (ref 135–145)

## 2018-10-13 LAB — CBC
HCT: 31.2 % — ABNORMAL LOW (ref 39.0–52.0)
Hemoglobin: 10.1 g/dL — ABNORMAL LOW (ref 13.0–17.0)
MCH: 28.2 pg (ref 26.0–34.0)
MCHC: 32.4 g/dL (ref 30.0–36.0)
MCV: 87.2 fL (ref 80.0–100.0)
Platelets: 246 10*3/uL (ref 150–400)
RBC: 3.58 MIL/uL — ABNORMAL LOW (ref 4.22–5.81)
RDW: 13.9 % (ref 11.5–15.5)
WBC: 10.3 10*3/uL (ref 4.0–10.5)
nRBC: 0 % (ref 0.0–0.2)

## 2018-10-13 LAB — PHOSPHORUS: Phosphorus: 4.2 mg/dL (ref 2.5–4.6)

## 2018-10-13 LAB — GLUCOSE, CAPILLARY
Glucose-Capillary: 116 mg/dL — ABNORMAL HIGH (ref 70–99)
Glucose-Capillary: 123 mg/dL — ABNORMAL HIGH (ref 70–99)
Glucose-Capillary: 140 mg/dL — ABNORMAL HIGH (ref 70–99)
Glucose-Capillary: 151 mg/dL — ABNORMAL HIGH (ref 70–99)
Glucose-Capillary: 180 mg/dL — ABNORMAL HIGH (ref 70–99)
Glucose-Capillary: 187 mg/dL — ABNORMAL HIGH (ref 70–99)

## 2018-10-13 LAB — MAGNESIUM: Magnesium: 2.1 mg/dL (ref 1.7–2.4)

## 2018-10-13 MED ORDER — DILTIAZEM HCL 25 MG/5ML IV SOLN
5.0000 mg | Freq: Once | INTRAVENOUS | Status: DC
  Filled 2018-10-13: qty 5

## 2018-10-13 MED ORDER — ALBUTEROL SULFATE (2.5 MG/3ML) 0.083% IN NEBU: 2.5000 mg | INHALATION_SOLUTION | Freq: Four times a day (QID) | RESPIRATORY_TRACT | Status: DC

## 2018-10-13 MED ORDER — ADENOSINE 6 MG/2ML IV SOLN
6.0000 mg | Freq: Once | INTRAVENOUS | Status: AC
  Administered 2018-10-13: 6 mg via INTRAVENOUS
  Filled 2018-10-13: qty 2

## 2018-10-13 MED ORDER — CEFAZOLIN SODIUM-DEXTROSE 2-4 GM/100ML-% IV SOLN
2.0000 g | INTRAVENOUS | Status: AC
  Administered 2018-10-14: 2 g via INTRAVENOUS
  Filled 2018-10-13: qty 100

## 2018-10-13 MED ORDER — METOPROLOL TARTRATE 5 MG/5ML IV SOLN
2.5000 mg | Freq: Four times a day (QID) | INTRAVENOUS | Status: DC | PRN
  Administered 2018-10-14 – 2018-10-27 (×3): 2.5 mg via INTRAVENOUS
  Filled 2018-10-13 (×4): qty 5

## 2018-10-13 MED ORDER — SODIUM CHLORIDE 0.9 % IV SOLN
2.0000 g | INTRAVENOUS | Status: DC
  Administered 2018-10-14: 2 g via INTRAVENOUS
  Filled 2018-10-13 (×2): qty 2

## 2018-10-13 MED ORDER — IPRATROPIUM-ALBUTEROL 0.5-2.5 (3) MG/3ML IN SOLN
3.0000 mL | Freq: Four times a day (QID) | RESPIRATORY_TRACT | Status: DC
  Administered 2018-10-13 – 2018-10-15 (×8): 3 mL via RESPIRATORY_TRACT
  Filled 2018-10-13 (×8): qty 3

## 2018-10-13 MED ORDER — DILTIAZEM LOAD VIA INFUSION
5.0000 mg | Freq: Once | INTRAVENOUS | Status: DC
  Filled 2018-10-13 (×2): qty 5

## 2018-10-13 MED ORDER — METOPROLOL TARTRATE 25 MG/10 ML ORAL SUSPENSION
25.0000 mg | Freq: Four times a day (QID) | ORAL | Status: DC
  Administered 2018-10-13 – 2018-10-20 (×25): 25 mg
  Filled 2018-10-13 (×32): qty 10

## 2018-10-13 MED ORDER — IPRATROPIUM-ALBUTEROL 0.5-2.5 (3) MG/3ML IN SOLN: 3.0000 mL | RESPIRATORY_TRACT | Status: DC | PRN

## 2018-10-13 NOTE — Consult Note (Signed)
Date of Admission:  09/27/2018          Reason for Consult: Pseudomonas aeruginosa bacteremia   Referring Provider: Dr.Kamein  Assessment:  1.  Pseudomonas aeruginosa bacteremia likely from  2. Infected port (it HAS TO COME OUT REGARDLESS) 3. Tracheitis 4. 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 Continue cefepime 2. REMOVE PORT 3. Follow-up on susceptibility pattern of Pseudomonas 4. Would treat for 10-14 days with systemic antibiotics AFTER port is out and AFTER blood cultures have sterilized  Principal Problem:   Squamous cell carcinoma of glottis (HCC) Active Problems:   Laryngeal mass   Goals of care, counseling/discussion   Dysphagia   Loss of weight   PSVT (paroxysmal supraventricular tachycardia) (HCC)   Protein-calorie malnutrition, severe   Scheduled Meds:  chlorhexidine  15 mL Mouth Rinse BID   diltiazem  5 mg Intravenous Once   doxazosin  1 mg Per Tube Daily   feeding supplement (OSMOLITE 1.5 CAL)  237 mL Per Tube 6 X Daily   feeding supplement (PRO-STAT SUGAR FREE 64)  30 mL Per Tube BID   hydrocortisone   Rectal QID   lidocaine  1 application Urethral Once   mouth rinse  15 mL Mouth Rinse q12n4p   metoprolol tartrate  25 mg Per Tube BID   simethicone  160 mg Per Tube QID   Continuous Infusions:  sodium chloride 75 mL/hr at 10/13/18 0000   ceFEPime (MAXIPIME) IV 2 g (10/13/18 1055)   lactated ringers 10 mL/hr at 10/07/18 0859   PRN Meds:.morphine injection, ondansetron (ZOFRAN) IV, promethazine  HPI: Logan Price is a 73 y.o. male GERD, hypertension, hyperlipidemia admitted to ENT service for laryngeal cancer, now status post surgery/trach/G-tube placement.  Hospital course complicated by SVT, diarrhea, nausea, vomiting, leukocytosis fever and found to have pseudomonas aeruginosa bacteremia.  Note he had a port placed by interventional radiology on 10 July.  This certainly could be the source of his bacteremia and  even if it is not the source it absolutely 100% has to be removed.  WE CANNOT TREAT THROUGH THE LINE WITH PSEUDOMONAS, STAPH AUREUS, CANDIDA AND MOST GRAM NEGATIVES  His tracheitis could be a possibility as well though I would put more suspicion on a centrally inserted catheter.  Regardless the treatment is the same removal of the catheter blood cultures to be taken after catheter removal followed by at least 10 if not 14 days of systemic antibiotics.  I would still favor 14 days.  Case discussed with his wife over the phone but at time I was not aware of port and had thought he had IJ   Review of Systems: Review of Systems  Unable to perform ROS: Other    Past Medical History:  Diagnosis Date   Diverticulitis    False positive serological test for hepatitis C 12/13/2016   GERD (gastroesophageal reflux disease)    Hepatitis C    HTN (hypertension)    Hyperlipidemia     Social History   Tobacco Use   Smoking status: Former Smoker    Packs/day: 1.00    Years: 40.00    Pack years: 40.00    Types: Cigarettes    Quit date: 10/09/2006    Years since quitting: 12.0   Smokeless tobacco: Never Used   Tobacco comment: quit x 9 years  Substance Use Topics   Alcohol use: Yes    Alcohol/week: 0.0 standard drinks    Comment: 7  oz of vodka per night   Drug use: No    Family History  Problem Relation Age of Onset   Cancer Mother    Hypertension Father    Hypertension Sister    Hypertension Brother    Hypertension Sister    Aneurysm Brother 59       brain   Colon cancer Neg Hx    Colon polyps Neg Hx    No Known Allergies  OBJECTIVE: Blood pressure (!) 118/58, pulse 95, temperature 99.1 F (37.3 C), temperature source Oral, resp. rate (!) 27, height 5\' 9"  (1.753 m), weight 69.6 kg, SpO2 96 %.  Physical Exam Constitutional:      General: He is not in acute distress. HENT:     Head: Normocephalic.  Eyes:     Extraocular Movements: Extraocular  movements intact.  Cardiovascular:     Rate and Rhythm: Tachycardia present.     Heart sounds: No murmur. No friction rub. No gallop.   Pulmonary:     Effort: No respiratory distress.     Breath sounds: Rhonchi present.  Abdominal:     General: Abdomen is flat.     Palpations: Abdomen is soft.  Musculoskeletal: Normal range of motion.  Skin:    General: Skin is warm.     Coloration: Skin is not jaundiced.  Neurological:     General: No focal deficit present.     Mental Status: He is alert.  Psychiatric:        Mood and Affect: Mood normal.     Lab Results Lab Results  Component Value Date   WBC 10.3 10/13/2018   HGB 10.1 (L) 10/13/2018   HCT 31.2 (L) 10/13/2018   MCV 87.2 10/13/2018   PLT 246 10/13/2018    Lab Results  Component Value Date   CREATININE 1.19 10/12/2018   BUN 27 (H) 10/12/2018   NA 129 (L) 10/12/2018   K 4.5 10/12/2018   CL 96 (L) 10/12/2018   CO2 24 10/12/2018    Lab Results  Component Value Date   ALT 16 04/25/2018   AST 21 04/25/2018   ALKPHOS 60 04/25/2018   BILITOT 0.4 04/25/2018     Microbiology: Recent Results (from the past 240 hour(s))  Gastrointestinal Panel by PCR , Stool     Status: None   Collection Time: 10/10/18  1:33 AM   Specimen: Stool  Result Value Ref Range Status   Campylobacter species NOT DETECTED NOT DETECTED Final   Plesimonas shigelloides NOT DETECTED NOT DETECTED Final   Salmonella species NOT DETECTED NOT DETECTED Final   Yersinia enterocolitica NOT DETECTED NOT DETECTED Final   Vibrio species NOT DETECTED NOT DETECTED Final   Vibrio cholerae NOT DETECTED NOT DETECTED Final   Enteroaggregative E coli (EAEC) NOT DETECTED NOT DETECTED Final   Enteropathogenic E coli (EPEC) NOT DETECTED NOT DETECTED Final   Enterotoxigenic E coli (ETEC) NOT DETECTED NOT DETECTED Final   Shiga like toxin producing E coli (STEC) NOT DETECTED NOT DETECTED Final   Shigella/Enteroinvasive E coli (EIEC) NOT DETECTED NOT DETECTED Final     Cryptosporidium NOT DETECTED NOT DETECTED Final   Cyclospora cayetanensis NOT DETECTED NOT DETECTED Final   Entamoeba histolytica NOT DETECTED NOT DETECTED Final   Giardia lamblia NOT DETECTED NOT DETECTED Final   Adenovirus F40/41 NOT DETECTED NOT DETECTED Final   Astrovirus NOT DETECTED NOT DETECTED Final   Norovirus GI/GII NOT DETECTED NOT DETECTED Final   Rotavirus A NOT DETECTED NOT DETECTED Final  Sapovirus (I, II, IV, and V) NOT DETECTED NOT DETECTED Final    Comment: Performed at [facility], [address] [place], [place] [address]  Culture, blood (routine x 2)     Status: Abnormal (Preliminary result)   Collection Time: 10/11/18  6:47 AM   Specimen: BLOOD LEFT HAND  Result Value Ref Range Status   Specimen Description BLOOD LEFT HAND  Final   Special Requests   Final    BOTTLES DRAWN AEROBIC ONLY Blood Culture results may not be optimal due to an inadequate volume of blood received in culture bottles   Culture  Setup Time   Final    AEROBIC BOTTLE ONLY GRAM NEGATIVE RODS CRITICAL RESULT CALLED TO, READ BACK BY AND VERIFIED WITH: J LEDFORD PHARMD 10/12/18 0425 JDW    Culture (A)  Final    PSEUDOMONAS AERUGINOSA SUSCEPTIBILITIES TO FOLLOW Performed at [facility], [address], [place], [place] [address]    Report Status PENDING  Incomplete  Blood Culture ID Panel (Reflexed)     Status: Abnormal   Collection Time: 10/11/18  6:47 AM  Result Value Ref Range Status   Enterococcus species NOT DETECTED NOT DETECTED Final   Listeria monocytogenes NOT DETECTED NOT DETECTED Final   Staphylococcus species NOT DETECTED NOT DETECTED Final   Staphylococcus aureus (BCID) NOT DETECTED NOT DETECTED Final   Streptococcus species NOT DETECTED NOT DETECTED Final   Streptococcus agalactiae NOT DETECTED NOT DETECTED Final   Streptococcus pneumoniae NOT DETECTED NOT DETECTED Final   Streptococcus pyogenes NOT DETECTED NOT DETECTED Final   Acinetobacter baumannii NOT  DETECTED NOT DETECTED Final   Enterobacteriaceae species NOT DETECTED NOT DETECTED Final   Enterobacter cloacae complex NOT DETECTED NOT DETECTED Final   Escherichia coli NOT DETECTED NOT DETECTED Final   Klebsiella oxytoca NOT DETECTED NOT DETECTED Final   Klebsiella pneumoniae NOT DETECTED NOT DETECTED Final   Proteus species NOT DETECTED NOT DETECTED Final   Serratia marcescens NOT DETECTED NOT DETECTED Final   Carbapenem resistance NOT DETECTED NOT DETECTED Final   Haemophilus influenzae NOT DETECTED NOT DETECTED Final   Neisseria meningitidis NOT DETECTED NOT DETECTED Final   Pseudomonas aeruginosa DETECTED (A) NOT DETECTED Final    Comment: CRITICAL RESULT CALLED TO, READ BACK BY AND VERIFIED WITH: J LEDFORD PHARMD 10/12/18 0425 JDW    Candida albicans NOT DETECTED NOT DETECTED Final   Candida glabrata NOT DETECTED NOT DETECTED Final   Candida krusei NOT DETECTED NOT DETECTED Final   Candida parapsilosis NOT DETECTED NOT DETECTED Final   Candida tropicalis NOT DETECTED NOT DETECTED Final    Comment: Performed at [facility], [address] [place], [place] [address]  Culture, blood (routine x 2)     Status: Abnormal (Preliminary result)   Collection Time: 10/11/18  7:45 AM   Specimen: BLOOD  Result Value Ref Range Status   Specimen Description BLOOD RIGHT ANTECUBITAL  Final   Special Requests   Final    BOTTLES DRAWN AEROBIC ONLY Blood Culture adequate volume   Culture  Setup Time   Final    AEROBIC BOTTLE ONLY GRAM NEGATIVE RODS CRITICAL VALUE NOTED.  VALUE IS CONSISTENT WITH PREVIOUSLY REPORTED AND CALLED VALUE.    Culture (A)  Final    PSEUDOMONAS AERUGINOSA SUSCEPTIBILITIES TO FOLLOW Performed at [facility], [place] [place], [place] [address]    Report Status PENDING  Incomplete    Alcide Evener, [facility] [phone] pager  10/13/2018, 11:29 AM

## 2018-10-13 NOTE — Consult Note (Signed)
Cardiology Consultation:   Patient ID: [name] MRN: [id number]; DOB: 1945/05/20  Admit date: 09/27/2018 Date of Consult: 10/13/2018  Primary Care Provider: [name], FNP Primary Cardiologist: Dr Purvis Sheffield Primary Electrophysiologist:  None    Patient Profile:   Logan Price is a 73 y.o. male with a hx of laryngeal cancer who is being seen today for the evaluation of tachycardia at the request of Dr Logan Price.  History of Present Illness:   Logan Price 44 to male history of HTN, HL, prior evaluation for chest pain and abnormal ekg in 01/2017 low risk negative nuclear stress. History of laryngeal cancer followed by ENT s/p surgery with trach and G-tube. Hospital course complicated by pseudomonas bacteremia, leukocytosis, hyponaremia, N/V/D. Also during admit issues with PSVT, one episode terimated with adenosine. Started on metoprolol, cardiology consulted to assist with management. The patient himself denies any palpitations.    Na 129 K 4.5 Cr 1.19 Mg 2 WBC 23.7 Plt 305  CXR no acute process 09/2018 echo LVEF 60-65%, small peri effusion,   EKG sinus tach July 9 EKG PSVT    12/2016 nuclear stress: Small, moderate intensity, mid to apical inferior defect that is predominantly fixed with partial reversibility noted towards the apex. Suggestive of scar with mild peri-infarct ischemia, although normal wall motion on gated imaging.      Heart Pathway Score:     Past Medical History:  Diagnosis Date  . Diverticulitis   . 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 GERD (gastroesophageal reflux disease)   . Hepatitis C   . HTN (hypertension)   . Hyperlipidemia     Past Surgical History:  Procedure Laterality Date  . BIOPSY  05/04/2015   Procedure: BIOPSY;  Surgeon: Logan Price;  [facility];  Service: Endoscopy;;  bx's of ileocecal valve   . COLONOSCOPY WITH PROPOFOL N/A 05/04/2015   Dr. Darrick Price: normal appearing ileum with prominent  IC valve with tubular adenomas, moderate diverticulosis in sigmoid colon, ascending colon, and retum. Moderate sized internal hemorrhoids. Surveillance in 5 years  . ESOPHAGOGASTRODUODENOSCOPY (EGD) WITH PROPOFOL N/A 12/19/2016   Procedure: ESOPHAGOGASTRODUODENOSCOPY (EGD) WITH PROPOFOL;  Surgeon: Logan Price;  [facility];  Service: Endoscopy;  Laterality: N/A;  11:30am  . FLEXIBLE SIGMOIDOSCOPY N/A 12/10/2015   hemorrhoid banding X 3   . HEMORRHOID BANDING N/A 12/10/2015   Procedure: HEMORRHOID BANDING;  Surgeon: Logan Price;  [facility];  Service: Endoscopy;  Laterality: N/A;  1:30 PM  . IR GASTROSTOMY TUBE MOD SED  10/04/2018  . IR IMAGING GUIDED PORT INSERTION  10/04/2018  . LAPAROSCOPIC INSERTION GASTROSTOMY TUBE Left 10/07/2018   Procedure: LAPAROSCOPIC  GASTROSTOMY TUBE;  Surgeon: Logan Price;  Location: [facility];  Service: General;  Laterality: Left;  Marland Kitchen MICROLARYNGOSCOPY N/A 09/27/2018   Procedure: MICRO DIRECT LARYNGOSCOPY WITH BIOPSY;  Surgeon: Logan Price;  Location: [facility] OR;  Service: ENT;  Laterality: N/A;  . None to date     As of 04/14/15  . POLYPECTOMY  05/04/2015   Procedure: POLYPECTOMY;  Surgeon: Logan Price;  [facility];  Service: Endoscopy;;  descending colon polyp, ascending colon polyp  . SAVORY DILATION N/A 12/19/2016   Procedure: SAVORY DILATION;  Surgeon: Logan Price;  [facility];  Service: Endoscopy;  Laterality: N/A;  . TRACHEOSTOMY TUBE PLACEMENT N/A 09/27/2018   Procedure: AWAKE TRACHEOSTOMY;  Surgeon: Logan Pies,  Price;  Location: [facility];  Service: ENT;  Laterality: N/A;     Inpatient Medications: Scheduled Meds: . chlorhexidine  15 mL Mouth Rinse BID  . diltiazem  5 mg Intravenous Once  . doxazosin  1 mg Per Tube Daily  . feeding supplement (OSMOLITE 1.5 CAL)  237 mL Per Tube 6 X Daily  . feeding supplement (PRO-STAT SUGAR FREE 64)  30 mL Per Tube BID  . hydrocortisone   Rectal QID   . lidocaine  1 application Urethral Once  . mouth rinse  15 mL Mouth Rinse q12n4p  . metoprolol tartrate  25 mg Per Tube BID  . simethicone  160 mg Per Tube QID   Continuous Infusions: . sodium chloride 75 mL/hr at 10/13/18 0000  . ceFEPime (MAXIPIME) IV 2 g (10/13/18 1055)  . lactated ringers 10 mL/hr at 10/07/18 0859   PRN Meds: morphine injection, ondansetron (ZOFRAN) IV, promethazine  Allergies:   No Known Allergies  Social History:   Social History   Socioeconomic History  . Marital status: Married    Spouse name: Not on file  . Number of children: Not on file  . Years of education: GED  . Highest education level: GED or equivalent  Occupational History  . Occupation: Retired    Comment: [employer]  Social Needs  . Financial resource strain: Not hard at all  . Food insecurity    Worry: Never true    Inability: Never true  . Transportation needs    Medical: No    Non-medical: No  Tobacco Use  . Smoking status: Former Smoker    Packs/day: 1.00    Years: 40.00    Pack years: 40.00    Types: Cigarettes    Quit date: 10/09/2006    Years since quitting: 12.0  . Smokeless tobacco: Never Used  . Tobacco comment: quit x 9 years  Substance and Sexual Activity  . Alcohol use: Yes    Alcohol/week: 0.0 standard drinks    Comment: 7 oz of vodka per night  . Drug use: No  . Sexual activity: Yes    Birth control/protection: None  Lifestyle  . Physical activity    Days per week: 0 days    Minutes per session: 0 min  . Stress: Only a little  Relationships  . Social connections    Talks on phone: More than three times a week    Gets together: More than three times a week    Attends religious service: Never    Active member of club or organization: No    Attends meetings of clubs or organizations: Never    Relationship status: Married  . Intimate partner violence    Fear of current or ex partner: No    Emotionally abused: No    Physically  abused: No    Forced sexual activity: No  Other Topics Concern  . Not on file  Social History Narrative  . Not on file    Family History:    Family History  Problem Relation Age of Onset  . Cancer Mother   . Hypertension Father   . Hypertension Sister   . Hypertension Brother   . Hypertension Sister   . Aneurysm Brother 32       brain  . Colon cancer Neg Hx   . Colon polyps Neg Hx      ROS:  Please see the history of present illness.  All other ROS reviewed and negative.  Physical Exam/Data:   Vitals:   10/13/18 0020 10/13/18 0412 10/13/18 0432 10/13/18 0753  BP:  (!) 118/58    Pulse:  99  95  Resp: 16 (!) 27 (!) 26 (!) 27  Temp:  99.1 F (37.3 C)    TempSrc:  Oral    SpO2: 98% 98%  96%  Weight:  69.6 kg    Height:        Intake/Output Summary (Last 24 hours) at 10/13/2018 1125 Last data filed at 10/13/2018 0148 Gross per 24 hour  Intake 905.66 ml  Output 275 ml  Net 630.66 ml   Last 3 Weights 10/13/2018 10/12/2018 10/11/2018  Weight (lbs) 153 lb 7 oz 149 lb 14.6 oz 149 lb 14.6 oz  Weight (kg) 69.6 kg 68 kg 68 kg     Body mass index is 22.66 kg/m.  General:  Frail appearing, no acute distress HEENT: normal Lymph: no adenopathy Neck: no JVD Endocrine:  No thryomegaly Vascular: No carotid bruits; FA pulses 2+ bilaterally without bruits  Cardiac:  normal S1, S2; RRR; no murmur  Lungs:  clear to auscultation bilaterally, no wheezing, rhonchi or rales  Abd: soft, nontender, no hepatomegaly  Ext: no edema Musculoskeletal:  No deformities, BUE and BLE strength normal and equal Skin: warm and dry  Neuro:  CNs 2-12 intact, no focal abnormalities noted Psych:  Normal affect    Laboratory Data:  High Sensitivity Troponin:  No results for input(s): TROPONINIHS in the last 720 hours.   Cardiac EnzymesNo results for input(s): TROPONINI in the last 168 hours. No results for input(s): TROPIPOC in the last 168 hours.  Chemistry Recent Labs  Lab 10/10/18 0603  10/11/18 0416 10/12/18 0226  NA 132* 129* 129*  K 4.1 4.6 4.5  CL 94* 94* 96*  CO2 26 24 24   GLUCOSE 161* 150* 144*  BUN 14 18 27*  CREATININE 0.67 0.94 1.19  CALCIUM 8.9 8.7* 8.1*  GFRNONAA >60 >60 >60  GFRAA >60 >60 >60  ANIONGAP 12 11 9     No results for input(s): PROT, ALBUMIN, AST, ALT, ALKPHOS, BILITOT in the last 168 hours. Hematology Recent Labs  Lab 10/10/18 0603 10/11/18 0416 10/13/18 0901  WBC 21.2* 23.7* 10.3  RBC 4.07* 3.92* 3.58*  HGB 11.6* 11.2* 10.1*  HCT 36.1* 34.3* 31.2*  MCV 88.7 87.5 87.2  MCH 28.5 28.6 28.2  MCHC 32.1 32.7 32.4  RDW 13.2 13.5 13.9  PLT 328 305 246   BNPNo results for input(s): BNP, PROBNP in the last 168 hours.  DDimer No results for input(s): DDIMER in the last 168 hours.   Radiology/Studies:  Dg Chest Port 1 View  Result Date: 10/11/2018 CLINICAL DATA:  Nausea and vomiting yesterday.  New tracheostomy. EXAM: PORTABLE CHEST 1 VIEW COMPARISON:  Chest CT 09/29/2018 FINDINGS: Tracheostomy tube appears in adequate position. Right IJ Port-A-Cath has tip over the SVC. Lungs are adequately inflated without focal airspace consolidation or effusion. No pneumothorax. Cardiomediastinal silhouette is within normal. Dense radiopaque material/calcifications over the posteromedial right lower lobe as seen on CT scan. Remainder of the exam is unchanged. IMPRESSION: No acute cardiopulmonary disease. Tracheostomy tube and right IJ Port-A-Cath in adequate position. Electronically Signed   By: Elberta Fortis M.D.   On: 10/11/2018 15:40   Dg Abd Portable 1v  Result Date: 10/11/2018 CLINICAL DATA:  New tracheostomy tube. Nausea and vomiting yesterday. EXAM: PORTABLE ABDOMEN - 1 VIEW COMPARISON:  10/05/2018 FINDINGS: Percutaneous gastrostomy tube projects over the stomach in the left upper  quadrant. Bowel gas pattern is nonobstructive. No free peritoneal air. Remainder of the exam is unchanged. IMPRESSION: Nonobstructive bowel gas pattern. Gastrostomy tube  projects over the stomach in the left upper quadrant. Electronically Signed   By: Elberta Fortis M.D.   On: 10/11/2018 15:41    Assessment and Plan:   1. PSVT - prior episode terminated with adenosine, EKGs consistent with PSVT and reentry tachycardia.  - K and Mg at goal. TSH was normal - has been on lopressor oral suspension 25mg  bid.   - there are some times of irregularity in the rhythm but I don't see any afib, looks to have intermittent PSVT, at times wandering atrial pacemaker and perhaps some MAT but no clear afib or aflutter.  - at this time anticoag is not indicated - titrate up his beta blocker, he is getting oral solution through his gtube. INcrease to 25mg  qid. Perhaps as infection and other acute stresses resolve drive for tachycardia will improve - if severe sustained SVT could start on dilt gtt    2. Extensive laryngeal cancer -followed by ENT, has trach and Gtube  3. Bacteremia - per medicine team  For questions or updates, please contact [facility] Please consult [email or web address] for contact info under     Signed, Dina Rich, Price  10/13/2018 11:25 AM

## 2018-10-13 NOTE — Plan of Care (Signed)
Will continue to monitor.

## 2018-10-13 NOTE — Progress Notes (Signed)
Occupational Therapy Treatment Patient Details Name: Logan Price MRN: [id number] DOB: [birth year] Today's Date: 10/13/2018    History of present illness Pt is a 73 yo male admitted for planned trach and laryngeal biopsy 7/2. 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 Esophagram 6/26 showed silent gross aspiration of barium. PMH includes: HLD, HTN, hepatitis C, GERD, diverticulitis   OT comments  Pt agreed to get OOB with OT.  Upon sitting up pts feeding tube leaking in bed. RN notified  Follow Up Recommendations  Home health OT    Equipment Recommendations  None recommended by OT       Precautions / Restrictions Precautions Precautions: Fall Precaution Comments: increased trach secretions;Gtube       Mobility Bed Mobility Overal bed mobility: Modified Independent             General bed mobility comments: assist for lines, HoB elevated  Transfers Overall transfer level: Needs assistance Equipment used: None Transfers: Sit to/from Stand;Stand Pivot Transfers Sit to Stand: Supervision Stand pivot transfers: Supervision            Balance Overall balance assessment: Needs assistance Sitting-balance support: No upper extremity supported Sitting balance-Leahy Scale: Good     Standing balance support: No upper extremity supported;During functional activity Standing balance-Leahy Scale: Fair                             ADL either performed or assessed with clinical judgement   ADL Overall ADL's : Needs assistance/impaired Eating/Feeding: NPO   Grooming: Set up;Sitting                   Toilet Transfer: Minimal assistance Toilet Transfer Details (indicate cue type and reason): bed to chair           General ADL Comments: Upon sit to stand from bed bed saturated in tube feeding. RN called to address     Vision Patient Visual Report: No change from baseline           Cognition Arousal/Alertness: Awake/alert Behavior During Therapy: WFL for tasks assessed/performed Overall Cognitive Status: Within Functional Limits for tasks assessed                                 General Comments: pt demonstrated good safety awareness with transfers         Prior Functioning/Environment              Frequency  Min 2X/week        Progress Toward Goals  OT Goals(current goals can now be found in the care plan section)  Progress towards OT goals: Progressing toward goals     Plan Discharge plan remains appropriate       AM-PAC OT "6 Clicks" Daily Activity     Outcome Measure   Help from another person eating meals?: Total Help from another person taking care of personal grooming?: A Little Help from another person toileting, which includes using toliet, bedpan, or urinal?: A Little Help from another person bathing (including washing, rinsing, drying)?: A Little Help from another person to put on and taking off regular upper body clothing?: A Little Help from another person to put on and taking off regular lower body clothing?: A Little 6 Click Score: 16    End of Session Equipment Utilized During Treatment:  Rolling walker  OT Visit Diagnosis: Unsteadiness on feet (R26.81);Muscle weakness (generalized) (M62.81)   Activity Tolerance Patient tolerated treatment well   Patient Left in chair;with call bell/phone within reach;with nursing/sitter in room   Nurse Communication Mobility status        Time: 6213-0865 OT Time Calculation (min): 19 min  Charges: OT General Charges $OT Visit: 1 Visit OT Treatments $Self Care/Home Management : 8-22 mins  Lise Auer, OT Acute Rehabilitation Services [phone] Office- [phone]      Josslynn Mentzer, Karin Golden D 10/13/2018, 3:39 PM

## 2018-10-13 NOTE — Progress Notes (Signed)
Patient HR in 140s, telemetry reading Atrial fibrillation. Paged Hospitalist. Awaiting a return call. No s/s of acute distress noted. Will continue to monitor.

## 2018-10-13 NOTE — Progress Notes (Signed)
Subjective: Resting comfortably in bed. No c/o this AM.  Objective: Vital signs in last 24 hours: Temp:  [98.5 F (36.9 C)-99.6 F (37.6 C)] 99.1 F (37.3 C) (07/19 0412) Pulse Rate:  [88-110] 95 (07/19 0753) Resp:  [16-28] 27 (07/19 0753) BP: (118-145)/(56-71) 118/58 (07/19 0412) SpO2:  [93 %-98 %] 96 % (07/19 0753) FiO2 (%):  [28 %] 28 % (07/19 0753) Weight:  [69.6 kg] 69.6 kg (07/19 0412)  General appearance:NAD.  Head:Normocephalic, without obvious abnormality, atraumatic Eyes:conjunctivae/corneas clear. PERRL, EOM's intact.  Ears:normal TM's and external ear canals both ears Nose:Nares normal. Septum midline. Mucosa normal. No drainage or sinus tenderness. Throat:lips, mucosa, and tongue normal; Neck:Trach midline. No bleeding. Neurologic:Grossly normal Abdomen: G-tube in place,no erythema.  Recent Labs    10/11/18 0416  WBC 23.7*  HGB 11.2*  HCT 34.3*  PLT 305   Recent Labs    10/11/18 0416 10/12/18 0226  NA 129* 129*  K 4.6 4.5  CL 94* 96*  CO2 24 24  GLUCOSE 150* 144*  BUN 18 27*  CREATININE 0.94 1.19  CALCIUM 8.7* 8.1*    Medications:  I have reviewed the patient's current medications. Scheduled: . chlorhexidine  15 mL Mouth Rinse BID  . doxazosin  1 mg Per Tube Daily  . feeding supplement (OSMOLITE 1.5 CAL)  237 mL Per Tube 6 X Daily  . feeding supplement (PRO-STAT SUGAR FREE 64)  30 mL Per Tube BID  . hydrocortisone   Rectal QID  . lidocaine  1 application Urethral Once  . mouth rinse  15 mL Mouth Rinse q12n4p  . metoprolol tartrate  25 mg Per Tube BID  . simethicone  160 mg Per Tube QID   Continuous: . sodium chloride 75 mL/hr at 10/13/18 0000  . ceFEPime (MAXIPIME) IV Stopped (10/12/18 2142)  . lactated ringers 10 mL/hr at 10/07/18 0859    Assessment/Plan: Laryngeal SCCA, s/p trachand G-tube placement -Elevated WBC and positive blood culture. Abx switched to cefepime per hospitalist. AM lab this morning is still pending.  Patient is afebrile. - Appreciate hospitalist's assistance in pt's medical management. - Diarrhea.Improved. - On Doxazosin for presumed BPH and voiding difficulty. -Neck CT shows extensive laryngeal/pharyngeal/esophagealSCCA. -Now has anuncuffed#[id number]. Lurline Idol and G-tube care teaching. - Pt's home health is not ready until Monday.   LOS: 16 days   Nevin Grizzle W Zahirah Cheslock 10/13/2018, 10:06 AM

## 2018-10-13 NOTE — Progress Notes (Addendum)
PROGRESS NOTE    Logan Price  MWU:[id number]  DOB: [birth year]  DOA: 09/27/2018 PCP: [name], FNP  Medical consult follow-up: 73 year old male with history of GERD, hypertension, hyperlipidemia admitted to ENT service for laryngeal cancer, now status post surgery/trach/G-tube placement.  Hospital course complicated by SVT, diarrhea, nausea, vomiting, leukocytosis and now with positive blood cultures for Pseudomonas.  Patient initially on IV Levaquin and overnight changed to IV cefepime.  He has remained on IV fluids during the hospital course.  Now receiving tube feeds via PEG.  GI viral panel negative.  Subjective:  Patient resting comfortably. Tmax 100.1 yetserday .  No diarrhea now. Went into SVT again this morning. Noted to have rt subclavian porta cath-placed on 7/10 by IR per chart review and d/w ENT. Patient went into SVT again this morning with HR in 140s  Objective: Vitals:   10/13/18 0412 10/13/18 0432 10/13/18 0753 10/13/18 1141  BP: (!) 118/58     Pulse: 99  95 (!) 114  Resp: (!) 27 (!) 26 (!) 27 (!) 29  Temp: 99.1 F (37.3 C)     TempSrc: Oral     SpO2: 98%  96% 95%  Weight: 69.6 kg     Height:        Intake/Output Summary (Last 24 hours) at 10/13/2018 1148 Last data filed at 10/13/2018 0148 Gross per 24 hour  Intake 905.66 ml  Output 275 ml  Net 630.66 ml   Filed Weights   10/11/18 0500 10/12/18 0500 10/13/18 0412  Weight: 68 kg 68 kg 69.6 kg    Physical Examination:  General exam: Appears calm and, status post trach, self suctioning Respiratory system: s/p trach with trach collar, upper airway secretions/rhonchi, otherwise Clear to auscultation. Respiratory effort normal. Cardiovascular system: S1 & S2 heard, tachycardic. No JVD, murmurs, rubs, gallops or clicks. No pedal edema. + Porta cath on RACW Gastrointestinal system: S/P PEG, Abdomen is nondistended, soft and nontender. No organomegaly or masses felt. Normal bowel sounds heard. Central  nervous system: Alert and oriented. No focal neurological deficits. Extremities: Symmetric 5 x 5 power. Skin: No rashes, lesions or ulcers Psychiatry: Judgement and insight appear normal. Mood & affect appropriate.     Data Reviewed: I have personally reviewed following labs and imaging studies  CBC: Recent Labs  Lab 10/10/18 0603 10/11/18 0416 10/13/18 0901  WBC 21.2* 23.7* 10.3  NEUTROABS 19.1* 20.8*  --   HGB 11.6* 11.2* 10.1*  HCT 36.1* 34.3* 31.2*  MCV 88.7 87.5 87.2  PLT 328 305 246   Basic Metabolic Panel: Recent Labs  Lab 10/07/18 0447  10/08/18 0654 10/09/18 0359 10/10/18 0603 10/11/18 0416 10/12/18 0226  NA 135  --  136 138 132* 129* 129*  K 5.1  --  4.2 4.6 4.1 4.6 4.5  CL 100  --  102 103 94* 94* 96*  CO2 28  --  27 26 26 24 24   GLUCOSE 120*  --  131* 114* 161* 150* 144*  BUN 7*  --  8 10 14 18  27*  CREATININE 0.77  --  0.77 0.67 0.67 0.94 1.19  CALCIUM 8.8*  --  8.6* 8.8* 8.9 8.7* 8.1*  MG 1.8   < > 1.6* 1.8 1.5* 2.6* 2.0  PHOS 4.1  --   --  4.1 3.5 5.2* 4.0   < > = values in this interval not displayed.   GFR: Estimated Creatinine Clearance: 56.1 mL/min (by C-G formula based on SCr of 1.19 mg/dL). Liver  Function Tests: No results for input(s): AST, ALT, ALKPHOS, BILITOT, PROT, ALBUMIN in the last 168 hours. No results for input(s): LIPASE, AMYLASE in the last 168 hours. No results for input(s): AMMONIA in the last 168 hours. Coagulation Profile: No results for input(s): INR, PROTIME in the last 168 hours. Cardiac Enzymes: No results for input(s): CKTOTAL, CKMB, CKMBINDEX, TROPONINI in the last 168 hours. BNP (last 3 results) No results for input(s): PROBNP in the last 8760 hours. HbA1C: No results for input(s): HGBA1C in the last 72 hours. CBG: Recent Labs  Lab 10/12/18 1655 10/12/18 2026 10/13/18 0013 10/13/18 0415 10/13/18 1031  GLUCAP 149* 127* 151* 123* 116*   Lipid Profile: No results for input(s): CHOL, HDL, LDLCALC, TRIG,  CHOLHDL, LDLDIRECT in the last 72 hours. Thyroid Function Tests: No results for input(s): TSH, T4TOTAL, FREET4, T3FREE, THYROIDAB in the last 72 hours. Anemia Panel: No results for input(s): VITAMINB12, FOLATE, FERRITIN, TIBC, IRON, RETICCTPCT in the last 72 hours. Sepsis Labs: Recent Labs  Lab 10/11/18 0648  PROCALCITON <0.10  LATICACIDVEN 1.5    Recent Results (from the past 240 hour(s))  Gastrointestinal Panel by PCR , Stool     Status: None   Collection Time: 10/10/18  1:33 AM   Specimen: Stool  Result Value Ref Range Status   Campylobacter species NOT DETECTED NOT DETECTED Final   Plesimonas shigelloides NOT DETECTED NOT DETECTED Final   Salmonella species NOT DETECTED NOT DETECTED Final   Yersinia enterocolitica NOT DETECTED NOT DETECTED Final   Vibrio species NOT DETECTED NOT DETECTED Final   Vibrio cholerae NOT DETECTED NOT DETECTED Final   Enteroaggregative E coli (EAEC) NOT DETECTED NOT DETECTED Final   Enteropathogenic E coli (EPEC) NOT DETECTED NOT DETECTED Final   Enterotoxigenic E coli (ETEC) NOT DETECTED NOT DETECTED Final   Shiga like toxin producing E coli (STEC) NOT DETECTED NOT DETECTED Final   Shigella/Enteroinvasive E coli (EIEC) NOT DETECTED NOT DETECTED Final   Cryptosporidium NOT DETECTED NOT DETECTED Final   Cyclospora cayetanensis NOT DETECTED NOT DETECTED Final   Entamoeba histolytica NOT DETECTED NOT DETECTED Final   Giardia lamblia NOT DETECTED NOT DETECTED Final   Adenovirus F40/41 NOT DETECTED NOT DETECTED Final   Astrovirus NOT DETECTED NOT DETECTED Final   Norovirus GI/GII NOT DETECTED NOT DETECTED Final   Rotavirus A NOT DETECTED NOT DETECTED Final   Sapovirus (I, II, IV, and V) NOT DETECTED NOT DETECTED Final    Comment: Performed at [facility], [address] [place], [place] [address]  Culture, blood (routine x 2)     Status: Abnormal (Preliminary result)   Collection Time: 10/11/18  6:47 AM   Specimen: BLOOD LEFT HAND    Result Value Ref Range Status   Specimen Description BLOOD LEFT HAND  Final   Special Requests   Final    BOTTLES DRAWN AEROBIC ONLY Blood Culture results may not be optimal due to an inadequate volume of blood received in culture bottles   Culture  Setup Time   Final    AEROBIC BOTTLE ONLY GRAM NEGATIVE RODS CRITICAL RESULT CALLED TO, READ BACK BY AND VERIFIED WITH: J LEDFORD PHARMD 10/12/18 0425 JDW    Culture (A)  Final    PSEUDOMONAS AERUGINOSA SUSCEPTIBILITIES TO FOLLOW Performed at [facility], [address], [place], [place] [address]    Report Status PENDING  Incomplete  Blood Culture ID Panel (Reflexed)     Status: Abnormal   Collection Time: 10/11/18  6:47 AM  Result Value Ref Range Status   Enterococcus species NOT DETECTED NOT DETECTED Final   Listeria monocytogenes NOT DETECTED NOT DETECTED Final   Staphylococcus species NOT DETECTED NOT DETECTED Final   Staphylococcus aureus (BCID) NOT DETECTED NOT DETECTED Final   Streptococcus species NOT DETECTED NOT DETECTED Final   Streptococcus agalactiae NOT DETECTED NOT DETECTED Final   Streptococcus pneumoniae NOT DETECTED NOT DETECTED Final   Streptococcus pyogenes NOT DETECTED NOT DETECTED Final   Acinetobacter baumannii NOT DETECTED NOT DETECTED Final   Enterobacteriaceae species NOT DETECTED NOT DETECTED Final   Enterobacter cloacae complex NOT DETECTED NOT DETECTED Final   Escherichia coli NOT DETECTED NOT DETECTED Final   Klebsiella oxytoca NOT DETECTED NOT DETECTED Final   Klebsiella pneumoniae NOT DETECTED NOT DETECTED Final   Proteus species NOT DETECTED NOT DETECTED Final   Serratia marcescens NOT DETECTED NOT DETECTED Final   Carbapenem resistance NOT DETECTED NOT DETECTED Final   Haemophilus influenzae NOT DETECTED NOT DETECTED Final   Neisseria meningitidis NOT DETECTED NOT DETECTED Final   Pseudomonas aeruginosa DETECTED (A) NOT DETECTED Final    Comment: CRITICAL RESULT CALLED TO, READ BACK BY AND  VERIFIED WITH: J LEDFORD PHARMD 10/12/18 0425 JDW    Candida albicans NOT DETECTED NOT DETECTED Final   Candida glabrata NOT DETECTED NOT DETECTED Final   Candida krusei NOT DETECTED NOT DETECTED Final   Candida parapsilosis NOT DETECTED NOT DETECTED Final   Candida tropicalis NOT DETECTED NOT DETECTED Final    Comment: Performed at [facility], [address], [place], [place] [address]  Culture, blood (routine x 2)     Status: Abnormal (Preliminary result)   Collection Time: 10/11/18  7:45 AM   Specimen: BLOOD  Result Value Ref Range Status   Specimen Description BLOOD RIGHT ANTECUBITAL  Final   Special Requests   Final    BOTTLES DRAWN AEROBIC ONLY Blood Culture adequate volume   Culture  Setup Time   Final    AEROBIC BOTTLE ONLY GRAM NEGATIVE RODS CRITICAL VALUE NOTED.  VALUE IS CONSISTENT WITH PREVIOUSLY REPORTED AND CALLED VALUE.    Culture (A)  Final    PSEUDOMONAS AERUGINOSA SUSCEPTIBILITIES TO FOLLOW Performed at [facility], [address], [place], [place] [address]    Report Status PENDING  Incomplete      Radiology Studies: Dg Chest Port 1 View  Result Date: 10/11/2018 CLINICAL DATA:  Nausea and vomiting yesterday.  New tracheostomy. EXAM: PORTABLE CHEST 1 VIEW COMPARISON:  Chest CT 09/29/2018 FINDINGS: Tracheostomy tube appears in adequate position. Right IJ Port-A-Cath has tip over the SVC. Lungs are adequately inflated without focal airspace consolidation or effusion. No pneumothorax. Cardiomediastinal silhouette is within normal. Dense radiopaque material/calcifications over the posteromedial right lower lobe as seen on CT scan. Remainder of the exam is unchanged. IMPRESSION: No acute cardiopulmonary disease. Tracheostomy tube and right IJ Port-A-Cath in adequate position. Electronically Signed   By: Elberta Fortis M.D.   On: 10/11/2018 15:40   Dg Abd Portable 1v  Result Date: 10/11/2018 CLINICAL DATA:  New tracheostomy tube. Nausea and vomiting  yesterday. EXAM: PORTABLE ABDOMEN - 1 VIEW COMPARISON:  10/05/2018 FINDINGS: Percutaneous gastrostomy tube projects over the stomach in the left upper quadrant. Bowel gas pattern is nonobstructive. No free peritoneal air. Remainder of the exam is unchanged. IMPRESSION: Nonobstructive bowel gas pattern. Gastrostomy tube projects over the stomach in the left upper quadrant. Electronically Signed   By: Elberta Fortis M.D.   On: 10/11/2018  15:41        Scheduled Meds:  adenosine (ADENOCARD) IV  6 mg Intravenous Once   chlorhexidine  15 mL Mouth Rinse BID   diltiazem  5 mg Intravenous Once   doxazosin  1 mg Per Tube Daily   feeding supplement (OSMOLITE 1.5 CAL)  237 mL Per Tube 6 X Daily   feeding supplement (PRO-STAT SUGAR FREE 64)  30 mL Per Tube BID   hydrocortisone   Rectal QID   lidocaine  1 application Urethral Once   mouth rinse  15 mL Mouth Rinse q12n4p   metoprolol tartrate  25 mg Per Tube BID   simethicone  160 mg Per Tube QID   Continuous Infusions:  sodium chloride 75 mL/hr at 10/13/18 0000   ceFEPime (MAXIPIME) IV 2 g (10/13/18 1055)   lactated ringers 10 mL/hr at 10/07/18 0859    Assessment & Plan:    1.  Pseudomonas bacteremia: Source could be tracheitis versus line infection.  Patient also has significant leukocytosis with T-max of 100.1.  Lactate 1.5.  Continue IV cefepime.   Consulted IR for removal of porta cath , appreciate ID evaluation and recommendations, sent tracheal aspirate for cultures.  Repeat Blood cx and will need 2 weeks of Abx after removal of Porta cath.   2.  Recurrent SVT: Converted to sinus rhythm with adenosine 6mg  x 1.No underlying A.fib noted (EKG pre and post adenosine done) He had required 1 dose of adenosine on 7/9 as well. TSH within normal limits. Echo on 7/9 with preserved EF. Continue po metoprolol 25 mg bid and will have additional IV metoprolol prn. Keep potassium close to 4 and magnesium to 2. Cardiology consulted by primary  service.   3.  Hyponatremia: SIADH versus dehydration versus dilutional.  Patient was receiving hypotonic fluids (D5 half-normal saline) which is discontinued.  Patient received a bolus of lactated Ringer's on early hours of 7/18 for hypotension.  Continue IV hydration given persistent tachycardia/bacteremia.  Labs pending today.  Free water via PEG  4.  Nausea/vomiting: Unclear etiology.  On tube feeds via PEG.  Abdominal x-ray unremarkable on 7/17.  PRN meds available.  Also on simethicone 4 times daily  5.  Diarrhea: Could be related to tube feeds.  Resolving.  GI panel negative.  6.  Mild hyperglycemia: Likely tube feed induced.  Sliding scale insulin.  Off dextrose fluids now.  7.  Laryngeal CA: S/p trach, now with trach collar 5 lits 02 and status post PEG for dysphagia. Added nebs and continue frequent tracheal suctioning.  Defer further Mx to ENT. Oncology has evaluated patient and planned for PET scan as outpatient to determine further course of action.    Disposition Plan: ENT service arranged for SNF .  Given ongoing bacteremia , SVT and electrolytes imbalances, ENT requesting Hospitalist service to assume care as primary attending and they will follow along which appears reasonable at this time. I was present bedside with rapid response nurse for adenosine administration and patient tolerated well. EKGs obtained. D/W ENT. D/W ID. Upgraded to step down status.   Critical careTime spent: 35 minutes   LOS: 16 days        Alessandra Bevels, MD [facility] Pager [phone]  If 7PM-7AM, please contact night-coverage [email or web address] Password [facility] 10/13/2018, 11:48 AM

## 2018-10-14 ENCOUNTER — Inpatient Hospital Stay (HOSPITAL_COMMUNITY): Payer: Medicare Other

## 2018-10-14 ENCOUNTER — Telehealth: Payer: Self-pay | Admitting: *Deleted

## 2018-10-14 ENCOUNTER — Encounter (HOSPITAL_COMMUNITY): Payer: Self-pay | Admitting: General Surgery

## 2018-10-14 DIAGNOSIS — D72829 Elevated white blood cell count, unspecified: Secondary | ICD-10-CM

## 2018-10-14 DIAGNOSIS — R1312 Dysphagia, oropharyngeal phase: Secondary | ICD-10-CM

## 2018-10-14 DIAGNOSIS — I471 Supraventricular tachycardia: Secondary | ICD-10-CM

## 2018-10-14 DIAGNOSIS — C320 Malignant neoplasm of glottis: Secondary | ICD-10-CM

## 2018-10-14 DIAGNOSIS — D649 Anemia, unspecified: Secondary | ICD-10-CM

## 2018-10-14 DIAGNOSIS — R7881 Bacteremia: Secondary | ICD-10-CM

## 2018-10-14 HISTORY — PX: IR REMOVAL TUN ACCESS W/ PORT W/O FL MOD SED: IMG2290

## 2018-10-14 LAB — CULTURE, BLOOD (ROUTINE X 2): Special Requests: ADEQUATE

## 2018-10-14 LAB — GLUCOSE, CAPILLARY
Glucose-Capillary: 122 mg/dL — ABNORMAL HIGH (ref 70–99)
Glucose-Capillary: 125 mg/dL — ABNORMAL HIGH (ref 70–99)
Glucose-Capillary: 128 mg/dL — ABNORMAL HIGH (ref 70–99)
Glucose-Capillary: 138 mg/dL — ABNORMAL HIGH (ref 70–99)
Glucose-Capillary: 142 mg/dL — ABNORMAL HIGH (ref 70–99)
Glucose-Capillary: 158 mg/dL — ABNORMAL HIGH (ref 70–99)
Glucose-Capillary: 159 mg/dL — ABNORMAL HIGH (ref 70–99)

## 2018-10-14 LAB — CBC
HCT: 32.8 % — ABNORMAL LOW (ref 39.0–52.0)
Hemoglobin: 10.8 g/dL — ABNORMAL LOW (ref 13.0–17.0)
MCH: 28.3 pg (ref 26.0–34.0)
MCHC: 32.9 g/dL (ref 30.0–36.0)
MCV: 86.1 fL (ref 80.0–100.0)
Platelets: 247 10*3/uL (ref 150–400)
RBC: 3.81 MIL/uL — ABNORMAL LOW (ref 4.22–5.81)
RDW: 13.9 % (ref 11.5–15.5)
WBC: 11.7 10*3/uL — ABNORMAL HIGH (ref 4.0–10.5)
nRBC: 0 % (ref 0.0–0.2)

## 2018-10-14 LAB — BASIC METABOLIC PANEL
Anion gap: 12 (ref 5–15)
BUN: 81 mg/dL — ABNORMAL HIGH (ref 8–23)
CO2: 21 mmol/L — ABNORMAL LOW (ref 22–32)
Calcium: 8.2 mg/dL — ABNORMAL LOW (ref 8.9–10.3)
Chloride: 101 mmol/L (ref 98–111)
Creatinine, Ser: 5.31 mg/dL — ABNORMAL HIGH (ref 0.61–1.24)
GFR calc Af Amer: 12 mL/min — ABNORMAL LOW (ref >60)
GFR calc non Af Amer: 10 mL/min — ABNORMAL LOW (ref >60)
Glucose, Bld: 155 mg/dL — ABNORMAL HIGH (ref 70–99)
Potassium: 5.1 mmol/L (ref 3.5–5.1)
Sodium: 134 mmol/L — ABNORMAL LOW (ref 135–145)

## 2018-10-14 LAB — PROTIME-INR
INR: 1.3 — ABNORMAL HIGH (ref 0.8–1.2)
Prothrombin Time: 15.5 seconds — ABNORMAL HIGH (ref 11.4–15.2)

## 2018-10-14 MED ORDER — HEPARIN SODIUM (PORCINE) 5000 UNIT/ML IJ SOLN
5000.0000 [IU] | Freq: Three times a day (TID) | INTRAMUSCULAR | Status: DC
  Administered 2018-10-14 – 2018-11-08 (×73): 5000 [IU] via SUBCUTANEOUS
  Filled 2018-10-14 (×73): qty 1

## 2018-10-14 MED ORDER — MIDAZOLAM HCL 2 MG/2ML IJ SOLN
INTRAMUSCULAR | Status: AC
  Filled 2018-10-14: qty 2

## 2018-10-14 MED ORDER — FREE WATER
200.0000 mL | Freq: Three times a day (TID) | Status: DC
  Administered 2018-10-14 – 2018-10-17 (×8): 200 mL

## 2018-10-14 MED ORDER — SODIUM CHLORIDE 0.9 % IV SOLN
INTRAVENOUS | Status: AC | PRN
  Administered 2018-10-14: 10 mL/h via INTRAVENOUS

## 2018-10-14 MED ORDER — LIDOCAINE HCL 1 % IJ SOLN
INTRAMUSCULAR | Status: AC
  Filled 2018-10-14: qty 20

## 2018-10-14 MED ORDER — MIDAZOLAM HCL 2 MG/2ML IJ SOLN
INTRAMUSCULAR | Status: AC | PRN
  Administered 2018-10-14: 1 mg via INTRAVENOUS
  Administered 2018-10-14: 0.5 mg via INTRAVENOUS

## 2018-10-14 MED ORDER — SODIUM CHLORIDE 0.9 % IV BOLUS: 500.0000 mL | Freq: Once | INTRAVENOUS | Status: DC

## 2018-10-14 MED ORDER — FENTANYL CITRATE (PF) 100 MCG/2ML IJ SOLN
INTRAMUSCULAR | Status: AC | PRN
  Administered 2018-10-14: 25 ug via INTRAVENOUS

## 2018-10-14 MED ORDER — FENTANYL CITRATE (PF) 100 MCG/2ML IJ SOLN
INTRAMUSCULAR | Status: AC
  Filled 2018-10-14: qty 2

## 2018-10-14 MED FILL — Medication: Qty: 1 | Status: AC

## 2018-10-14 NOTE — Progress Notes (Signed)
SLP Cancellation Note  Patient Details Name: Logan Price MRN: [id number] DOB: [birth year]   Cancelled treatment:       Reason Eval/Treat Not Completed: Patient at procedure or test/unavailable   Venita Sheffield Taya Ashbaugh 10/14/2018, 1:20 PM  [name], M.A. [place] Acute Environmental education officer [phone] Office [phone]

## 2018-10-14 NOTE — Progress Notes (Signed)
Asked to f/up on creatinine by day attending. Creat now up to 5.31. Pt has over 1100cc of urine retained in his bladder now. Foley being placed. NP talked with nephro who agreed that urinary retention was likely the cause of the creatinine and should begin to correct itself. Pt unable to take Flomax via tube, so Doxazosin was started a few days ago.  KJKG, NP [facility]

## 2018-10-14 NOTE — Progress Notes (Signed)
Logan Price   DOB:02/10/1946   ZD#:[id number]    HEME/ONC PROBLEMS: 1. 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 Basilio Cairo of radiation oncology  -The initial plan was to proceed with outpatient definitive chemoRT, once PET was done and did not show any definite metastatic disease -However, the patient's hospitalization has been complicated by SVT, AKI and bacteremia. He has already been inpatient for over 2 weeks, and there is no anticipated date for discharge -Due to AKI, cisplatin (to be given concurrently with RT) is contraindicated; furthermore, his advanced age makes him high risk for developing severe side effects from cisplatin -Carboplatin was thought to be a reasonable choice, but it is associated with significant immunosuppression, and in light of his going pseudomonas bacteremia, it would lead to worsening infection that could potentially be life-threatening and therefore it is not a treatment option -In an ideal situation, we would wait for the infection to resolve, the patient to improve clinically, and to improve his performance status, so that we can pursue outpatient PET and determine a date for concurrent chemoRT -However, given the bulky disease and protracted hospital course with no anticipated discharge date, that could lead to significant delay in initiating treatment and increased risk of disease progression -Therefore, in light of these challenging circumstances, I would advise proceeding with RT alone at the discretion of Dr.  Basilio Cairo as soon as possible, recognizing that RT alone may be slightly less efficacious than chemoRT, but would also lead to less delay by at least several weeks -I discussed this at length with the patient, who expressed understanding and agreed with the plan -I will defer to radiation oncology to coordinate the transfer of the patient to [facility] for simulation and treatment   Pseudomonas bacteremia -Port removed today -ID following, appreciate their assistance -Plan for at least 2 weeks of IV cefepime   AKI -Cr noted to be 3.63 on 10/13/2018; no renal function today -I would recommend daily monitoring of renal function to ensure that his Cr is improving -I will defer the management to the hospitalist team  Leukocytosis -WBC 11.7k today, stable -Most likely secondary to underlying infection -Continue abx and ID management as above  Normocytic anemia -Most likely secondary to anemia of chronic disease -Hgb 10.8 today, stable -Patient denies any symptoms of bleeding -Continue supportive care per hospitalist team   Thank you for the opportunity to participate in Logan Price's care. Oncology is available as needed for any questions.   Logan Holms, MD 10/14/2018  2:32 PM   Subjective:  Logan Price had port removed this morning. He reports that the procedure went well. He is still having at least one episode of diarrhea per day, but he denied any abdominal pain, nausea, vomiting, diarrhea, hematochezia or melena.   ROS: Constitutional: ( - ) fevers, ( - )  chills , ( - ) night sweats Ears, nose, mouth, throat, and face: ( - ) mucositis, ( - ) sore throat Respiratory: ( - ) cough, ( - ) dyspnea, ( - ) wheezes Cardiovascular: ( - ) palpitation, ( - ) chest discomfort, ( - )  lower extremity swelling Gastrointestinal:  ( - ) nausea, ( - ) heartburn, ( + ) change in bowel habits Skin: ( - ) abnormal skin rashes Behavioral/Psych: ( - ) mood change, ( - ) new changes  All other systems were reviewed  with the patient and are negative.  Objective:  Vitals:   10/14/18 1305 10/14/18 1310  BP: 131/73 138/83  Pulse: 84 84  Resp: (!) 27 (!) 30  Temp:    SpO2: 100% 100%     Intake/Output Summary (Last 24 hours) at 10/14/2018 1432 Last data filed at 10/14/2018 6578 Gross per 24 hour  Intake 787 ml  Output 1 ml  Net 786 ml    GENERAL: alert, no distress, frail appearing lying in bed  SKIN: skin color, texture, turgor are normal, no rashes or significant lesions EYES: conjunctiva are pink and non-injected, sclera clear OROPHARYNX: no exudate, no erythema; lips, buccal mucosa, and tongue normal  NECK: supple, non-tender, tracheostomy in place with copious secretions  LUNGS: clear to auscultation with normal breathing effort HEART: regular rate & rhythm and no murmurs and no lower extremity edema ABDOMEN: soft, non-tender, non-distended, normal bowel sounds PSYCH: alert & oriented, whispering speech    Labs:  Lab Results  Component Value Date   WBC 11.7 (H) 10/14/2018   HGB 10.8 (L) 10/14/2018   HCT 32.8 (L) 10/14/2018   MCV 86.1 10/14/2018   PLT 247 10/14/2018   NEUTROABS 20.8 (H) 10/11/2018    Lab Results  Component Value Date   NA 132 (L) 10/13/2018   K 4.7 10/13/2018   CL 100 10/13/2018   CO2 21 (L) 10/13/2018    Studies:  No results found.

## 2018-10-14 NOTE — Telephone Encounter (Signed)
Oncology Nurse Navigator Documentation  Spoke with patient's wife, provided her update re her husband's transfer to [facility] tomorrow, appt for RT planning on Wed.  Provided her my phone number, encouraged her to call with questions/concerns.  Called pt's brother, informed same.  Gayleen Orem, RN, BSN Head & Neck Oncology Nurse [facility] [place] [phone]

## 2018-10-14 NOTE — Consult Note (Signed)
Chief Complaint: Patient was seen in consultation today for bacteremia/Port-a-cath removal.  Referring Physician(s): Guilford Shi  Supervising Physician: Aletta Edouard  Patient Status: [facility] - In-pt  History of Present Illness: Logan Price is a 73 y.o. male with a past medical history of hypertension, hyperlipidemia, GERD, diverticulitis, hepatitis C, and laryngeal cancer. He has been admitted to [facility] since 09/27/2018 for management of laryngeal cancer. He underwent an awake tracheostomy and laryngoscopy with biopsy of his laryngeal mass in OR 09/27/2018 by Dr. Benjamine Mola. He then had a Port-a-cath placed in IR 10/04/2018 by Dr. Earleen Newport and a gastrostomy tube placed in OR 10/07/2018 by Dr. Kieth Brightly, both in anticipation that patient will begin systemic chemotherapy. Unfortunately, hospital course complicated with leukocytosis and positive blood cultures for Pseudomonas. ID was consulted and recommended IR consultation for possible Port-a-cath removal followed by 10-14 days of systemic antibiotics following removal/sterilized blood cultures.  IR consulted by Dr. Earnest Conroy for possible Port-a-cath removal. Patient awake and alert laying in bed watching TV. Denies fever, chills, chest pain, dyspnea, abdominal pain, or headache.   Past Medical History:  Diagnosis Date   Diverticulitis    False positive serological test for hepatitis C 12/13/2016   GERD (gastroesophageal reflux disease)    Hepatitis C    HTN (hypertension)    Hyperlipidemia     Past Surgical History:  Procedure Laterality Date   BIOPSY  05/04/2015   Procedure: BIOPSY;  Surgeon: Danie Binder, MD;  [facility];  Service: Endoscopy;;  bx's of ileocecal valve    COLONOSCOPY WITH PROPOFOL N/A 05/04/2015   Dr. Oneida Alar: normal appearing ileum with prominent IC valve with tubular adenomas, moderate diverticulosis in sigmoid colon, ascending colon, and retum. Moderate sized internal hemorrhoids. Surveillance in 5  years   ESOPHAGOGASTRODUODENOSCOPY (EGD) WITH PROPOFOL N/A 12/19/2016   Procedure: ESOPHAGOGASTRODUODENOSCOPY (EGD) WITH PROPOFOL;  Surgeon: Danie Binder, MD;  [facility];  Service: Endoscopy;  Laterality: N/A;  11:30am   FLEXIBLE SIGMOIDOSCOPY N/A 12/10/2015   hemorrhoid banding X 3    HEMORRHOID BANDING N/A 12/10/2015   Procedure: HEMORRHOID BANDING;  Surgeon: Danie Binder, MD;  [facility]: [facility];  Service: Endoscopy;  Laterality: N/A;  1:30 PM   IR GASTROSTOMY TUBE MOD SED  10/04/2018   IR IMAGING GUIDED PORT INSERTION  10/04/2018   LAPAROSCOPIC INSERTION GASTROSTOMY TUBE Left 10/07/2018   Procedure: LAPAROSCOPIC  GASTROSTOMY TUBE;  Surgeon: Kinsinger, Arta Bruce, MD;  Location: [place];  Service: General;  Laterality: Left;   MICROLARYNGOSCOPY N/A 09/27/2018   Procedure: MICRO DIRECT LARYNGOSCOPY WITH BIOPSY;  Surgeon: Leta Baptist, MD;  Location: [place];  Service: ENT;  Laterality: N/A;   None to date     As of 04/14/15   POLYPECTOMY  05/04/2015   Procedure: POLYPECTOMY;  Surgeon: Danie Binder, MD;  [facility];  Service: Endoscopy;;  descending colon polyp, ascending colon polyp   SAVORY DILATION N/A 12/19/2016   Procedure: SAVORY DILATION;  Surgeon: Danie Binder, MD;  [facility];  Service: Endoscopy;  Laterality: N/A;   TRACHEOSTOMY TUBE PLACEMENT N/A 09/27/2018   Procedure: AWAKE TRACHEOSTOMY;  Surgeon: Leta Baptist, MD;  Location: [facility];  Service: ENT;  Laterality: N/A;    Allergies: Patient has no known allergies.  Medications: Prior to Admission medications   Medication Sig Start Date End Date Taking? Authorizing Provider  amLODipine (NORVASC) 10 MG tablet TAKE 1 TABLET DAILY 08/05/18  Yes Sharion Balloon, FNP  aspirin EC 81 MG tablet Take 81 mg by mouth daily.   Yes [provider]  Cholecalciferol (VITAMIN D) 2000 units CAPS Take 2,000 Units by mouth daily.   Yes [provider]  fluticasone (FLONASE) 50 MCG/ACT  nasal spray SPRAY 2 SPRAYS INTO EACH NOSTRIL EVERY DAY Patient taking differently: Place 2 sprays into both nostrils daily.  08/21/18  Yes Hawks, Christy A, FNP  Multiple Vitamin (MULTIVITAMIN WITH MINERALS) TABS tablet Take 1 tablet by mouth daily.   Yes [provider]  pantoprazole (PROTONIX) 40 MG tablet 1 po 30 mins prior to first meal Patient taking differently: Take 40 mg by mouth daily. 1 po 30 mins prior to first meal 12/12/17  Yes Fields, Sandi L, MD  simvastatin (ZOCOR) 20 MG tablet TAKE 1 TABLET AT BEDTIME 08/05/18  Yes Hawks, Christy A, FNP  Amino Acids-Protein Hydrolys (FEEDING SUPPLEMENT, PRO-STAT SUGAR FREE 64,) LIQD Place 30 mLs into feeding tube 2 (two) times daily. 10/09/18   Leta Baptist, MD  doxazosin (CARDURA) 1 MG tablet Place 1 tablet (1 mg total) into feeding tube daily. 10/10/18   Leta Baptist, MD  HYDROcodone-acetaminophen (HYCET) 7.5-325 mg/15 ml solution Take 15 mLs by mouth every 6 (six) hours as needed for up to 5 days for moderate pain or severe pain. 10/09/18 10/14/18  Leta Baptist, MD  metoprolol tartrate (LOPRESSOR) 25 mg/10 mL SUSP Place 5 mLs (12.5 mg total) into feeding tube 2 (two) times daily. 10/09/18   Leta Baptist, MD  Nutritional Supplements (FEEDING SUPPLEMENT, OSMOLITE 1.5 CAL,) LIQD Place 1,000 mLs into feeding tube continuous. 10/09/18   Leta Baptist, MD     Family History  Problem Relation Age of Onset   Cancer Mother    Hypertension Father    Hypertension Sister    Hypertension Brother    Hypertension Sister    Aneurysm Brother 55       brain   Colon cancer Neg Hx    Colon polyps Neg Hx     Social History   Socioeconomic History   Marital status: Married    Spouse name: Not on file   Number of children: Not on file   Years of education: GED   Highest education level: GED or equivalent  Occupational History   Occupation: Retired    Comment: [employer]  Social Needs   Financial resource strain: Not hard at all   Food  insecurity    Worry: Never true    Inability: Never true   Transportation needs    Medical: No    Non-medical: No  Tobacco Use   Smoking status: Former Smoker    Packs/day: 1.00    Years: 40.00    Pack years: 40.00    Types: Cigarettes    Quit date: 10/09/2006    Years since quitting: 12.0   Smokeless tobacco: Never Used   Tobacco comment: quit x 9 years  Substance and Sexual Activity   Alcohol use: Yes    Alcohol/week: 0.0 standard drinks    Comment: 7 oz of vodka per night   Drug use: No   Sexual activity: Yes    Birth control/protection: None  Lifestyle   Physical activity    Days per week: 0 days    Minutes per session: 0 min   Stress: Only a little  Relationships   Social connections    Talks on phone: More than three times a week    Gets together: More than three times a  week    Attends religious service: Never    Active member of club or organization: No    Attends meetings of clubs or organizations: Never    Relationship status: Married  Other Topics Concern   Not on file  Social History Narrative   Not on file     Review of Systems: A 12 point ROS discussed and pertinent positives are indicated in the HPI above.  All other systems are negative.  Review of Systems  Constitutional: Negative for chills and fever.  Respiratory: Negative for shortness of breath and wheezing.   Cardiovascular: Negative for chest pain and palpitations.  Gastrointestinal: Negative for abdominal pain.  Neurological: Negative for headaches.  Psychiatric/Behavioral: Negative for behavioral problems and confusion.    Vital Signs: BP (!) 152/69    Pulse 80    Temp 97.8 F (36.6 C) (Oral)    Resp (!) 22    Ht 5\' 9"  (1.753 m)    Wt 156 lb 4.9 oz (70.9 kg)    SpO2 94%    BMI 23.08 kg/m   Physical Exam Vitals signs and nursing note reviewed.  Constitutional:      General: He is not in acute distress.    Appearance: Normal appearance.     Comments: Tracheostomy.    Cardiovascular:     Rate and Rhythm: Normal rate and regular rhythm.     Heart sounds: Normal heart sounds. No murmur.  Pulmonary:     Effort: Pulmonary effort is normal. No respiratory distress.     Breath sounds: Normal breath sounds. No wheezing.     Comments: Tracheostomy. Skin:    General: Skin is warm and dry.  Neurological:     Mental Status: He is alert and oriented to person, place, and time.  Psychiatric:        Mood and Affect: Mood normal.        Behavior: Behavior normal.        Thought Content: Thought content normal.        Judgment: Judgment normal.      MD Evaluation Airway: Other (comments) Airway comments: Tracheostomy Heart: WNL Abdomen: WNL Chest/ Lungs: WNL ASA  Classification: 3 Mallampati/Airway Score: Three(Tracheostomy)   Imaging: Ct Abdomen Wo Contrast  Result Date: 10/01/2018 CLINICAL DATA:  73 year old male undergoing evaluation for gastrostomy tube placement. EXAM: CT ABDOMEN WITHOUT CONTRAST TECHNIQUE: Multidetector CT imaging of the abdomen was performed following the standard protocol without IV contrast. COMPARISON:  Prior abdominal ultrasound 12/25/2016 FINDINGS: Lower chest: Mild lower lobe bronchial wall thickening. Trace atelectasis versus infiltrate in the dependent portion of the right lower lobe. No suspicious pulmonary nodule or mass. The visualized heart is normal in size. Unremarkable distal thoracic esophagus. Hepatobiliary: Normal hepatic contour and morphology. No discrete hepatic lesion. The gallbladder lumen is filled with high density material likely representing sludge and/or small stones. No biliary ductal dilatation. Pancreas: Unremarkable. No pancreatic ductal dilatation or surrounding inflammatory changes. Spleen: Normal in size without focal abnormality. Adrenals/Urinary Tract: Normal adrenal glands. Circumscribed low-attenuation lesions within the right kidney are incompletely characterized in the absence of intravenous  contrast. However, a sonographically simple cyst was previously identified in the lower pole. No evidence of hydronephrosis or nephrolithiasis. Stomach/Bowel: High-riding transverse colon which lies anterior to the gastric body and antrum. Otherwise, normal gastric anatomy. No focal bowel wall thickening or evidence of obstruction. Vascular/Lymphatic: Limited evaluation in the absence of intravenous contrast. Extensive atherosclerotic vascular calcifications. No aneurysm. Other: No ascites or abdominal wall  hernia. Musculoskeletal: No acute fracture or aggressive appearing lytic or blastic osseous lesion. IMPRESSION: 1. High-riding transverse colon which lies anterior to the gastric body and antrum. Percutaneous gastrostomy tube placement may be possible, however will require good visualization of the transverse colon at the time of attempted procedure. Consider administration of barium prior to the procedure. 2.  Aortic Atherosclerosis (ICD10-170.0). 3. Right lower lobe atelectasis versus changes of small volume aspiration. 4. Sludge and/or small stones in the gallbladder lumen. Electronically Signed   By: Jacqulynn Cadet M.D.   On: 10/01/2018 14:18   Dg Orthopantogram  Result Date: 10/08/2018 CLINICAL DATA:  Poor dentition. Preoperative for laryngeal carcinoma. EXAM: ORTHOPANTOGRAM/PANORAMIC COMPARISON:  None. FINDINGS: Missing LEFT maxillary teeth from a first bicuspid through distal molar. Missing RIGHT maxillary teeth from second bicuspid through distal molar. Missing RIGHT mandibular teeth from first through distal molar. Missing LEFT mandibular first molar. No periapical lucency to suggest abscess. No definite caries. IMPRESSION: 1. Numerous missing mandibular and maxillary teeth, as detailed above. 2. No evidence of periapical dental abscess. Electronically Signed   By: Franki Cabot M.D.   On: 10/08/2018 13:27   Dg Abd 1 View  Result Date: 10/05/2018 CLINICAL DATA:  Is a gastric tube placement.  EXAM: ABDOMEN - 1 VIEW COMPARISON:  October 04, 2018 FINDINGS: Nonobstructive bowel gas pattern. Residual contrast throughout the colon and in the appendix. Nonobstructive bowel gas pattern. Enteric catheter overlies the expected location of the gastric cardia with side hole at the expected location of the GE junction. IMPRESSION: Enteric catheter overlies the expected location of the gastric cardia with side hole at the expected location of the GE junction. Electronically Signed   By: Fidela Salisbury M.D.   On: 10/05/2018 15:55   Dg Abd 1 View  Result Date: 10/02/2018 CLINICAL DATA:  Enteric tube placement. EXAM: ABDOMEN - 1 VIEW COMPARISON:  CT abdomen from yesterday. FINDINGS: Enteric tube tip in the stomach with the proximal side port in the lower esophagus. Nonobstructive bowel gas pattern. No acute osseous abnormality. IMPRESSION: 1. Enteric tube tip in the stomach with proximal side port in the lower esophagus. Recommend advancing 7-8 cm. Electronically Signed   By: Titus Dubin M.D.   On: 10/02/2018 18:59   Ct Soft Tissue Neck W Contrast  Result Date: 09/28/2018 CLINICAL DATA:  73 year old male status post tracheostomy and laryngeal biopsy postoperative day 1. Findings suspicious for squamous cell carcinoma. EXAM: CT NECK WITH CONTRAST TECHNIQUE: Multidetector CT imaging of the neck was performed using the standard protocol following the bolus administration of intravenous contrast. CONTRAST:  19mL OMNIPAQUE IOHEXOL 300 MG/ML  SOLN COMPARISON:  Neck CT 02/22/2018. FINDINGS: Pharynx and larynx: Tracheostomy has been placed, and there is a small volume of soft tissue gas tracking in the retropharyngeal space, in the bilateral neck, and into the visible mediastinum. The tube appears well positioned. There is debris in the cricoid above the tube. Trace retained secretions or debris in the trachea below the tube. There is an infiltrative and fungating heterogeneously enhancing mass now occupying the  larynx, chiefly at and above the glottis, but also possibly infiltrating and involving the cervical esophagus below the glottis (series 3, image 80). There is extension through the right thyroid cartilage and contiguous appearing tumor - probably ex nodal disease - in the right lower neck lateral to the carotid space (series 3, image 64). There is associated narrowing of the right IJ at that level, which remains patent. All told, the poorly marginated hyperenhancing  tumor encompasses 35 x 76 x 66 millimeters (AP by transverse by CC). See series 3, image 63, 64, 74, and coronal images 51 through 54. Tumor extends to the AE folds and the mucosa hypopharynx is generally hyperenhancing. The oropharynx and nasopharynx are spared. Negative superior parapharyngeal spaces, the right lower paralaryngeal spaces are abnormal. The retropharyngeal space contains gas from the tracheostomy. Salivary glands: Negative sublingual space. The submandibular glands and parotid glands remain within normal limits. Thyroid: Postoperative changes from tracheostomy, but the posterior right thyroid lobe also appears abutted by tumor on series 3, image 84. Lymph nodes: Extensive right level 3 Aks nodal disease suspected, resulting in a semi contiguous appearance of tumor from the level 3 station into the larynx. Malignant right level 2A lymph nodes measuring 16 millimeter short axis on series 3, image 47. No level 1 node involvement. No abnormal level 4 nodes are evident. There are small but conspicuous left level 2 B (series 3, image 40) and level IIIb (image 61) lymph nodes which are larger than in 2019, up to 7 millimeters diameter. Vascular: Mass effect on the right internal jugular vein from tumor in the right lower neck, but the vein remains patent. Tumor surrounds the right lower carotid space but the right carotid remains patent with calcified atherosclerosis. Other major vascular structures in the neck and at the skull base are patent.  Limited intracranial: Negative. Visualized orbits: Negative. Mastoids and visualized paranasal sinuses: Clear. Skeleton: Hyperenhancing tumor appears to occupy the prevertebral space in the lower cervical spine from C4-C5 through C7-T1, and probably is associated with cervical esophageal involvement. See series 3, image 80 and sagittal image 52. No bone erosion identified. No lytic or suspicious osseous lesion identified elsewhere. Upper chest: Centrilobular emphysema. Stable small right apical nodules on series 9, image 88, and left apically nodule on image 94, which are likely related to lung scarring. Small volume pneumomediastinum following tracheostomy tube placement. Distal to the tube the trachea appears patent with some retained secretions. No superior mediastinal lymphadenopathy. Calcified aortic atherosclerosis. IMPRESSION: 1. Infiltrative and poorly marginated enhancing tumor in the lower neck eccentric to the right appears to involve the larynx, the adjacent cervical esophagus, and is somewhat contiguous with ex-nodal disease in the right level 3 station, surrounding the right carotid space and narrowing the right IJ which remains patent. - all told tumor encompasses 35 x 76 x 66 mm (AP by transverse by CC) - prevertebral space involvement suspected C4-C5 through C7-T1 but no underlying bony changes. - malignant right level 2 nodal disease, and small but suspicious contralateral left level 2 and left level 3 nodes. 2. Status post tracheostomy with small volume pneumomediastinum and soft tissue gas in the neck. The airway appears patent below the tracheostomy tube and no adverse features are identified. Electronically Signed   By: Genevie Ann M.D.   On: 09/28/2018 17:34   Ct Chest W Contrast  Result Date: 09/29/2018 CLINICAL DATA:  Neck mass, advanced neck cancer, status post tracheostomy. EXAM: CT CHEST WITH CONTRAST TECHNIQUE: Multidetector CT imaging of the chest was performed during intravenous  contrast administration. CONTRAST:  49mL OMNIPAQUE IOHEXOL 300 MG/ML  SOLN COMPARISON:  09/28/2018 neck CT. FINDINGS: Cardiovascular: Normal heart size. No significant pericardial effusion/thickening. Three-vessel coronary atherosclerosis. Atherosclerotic nonaneurysmal thoracic aorta. Normal caliber pulmonary arteries. No central pulmonary emboli. Mediastinum/Nodes: No discrete thyroid nodules. Normal thoracic esophagus. Infiltrative right neck mass involving larynx and cervical esophagus, as detailed on neck CT from 1 day prior. Subcutaneous emphysema scattered  in the lower neck bilaterally. Scattered pneumomediastinum throughout the bilateral paratracheal and paraesophageal regions. No pathologically enlarged axillary, mediastinal or hilar lymph nodes. Lungs/Pleura: No pneumothorax. No pleural effusion. Moderate centrilobular emphysema with diffuse bronchial wall thickening. Tracheostomy tube tip is in the tracheal lumen just below thoracic inlet. No acute consolidative airspace disease or lung masses. Scattered calcified granulomas throughout the medial right lower lobe. A few scattered solid pulmonary nodules in the upper lobes bilaterally, predominantly at the lung apices, largest 6 mm at the apical left upper lobe (series 5/image 17) and 5 mm in the apical right upper lobe (series 5/image 18), all stable since 02/22/2018 CT. Upper abdomen: No acute abnormality. Musculoskeletal: No aggressive appearing focal osseous lesions. Mild symmetric bilateral gynecomastia. Moderate thoracic spondylosis. IMPRESSION: 1. No thoracic adenopathy or other findings suspicious for metastatic disease in the chest. 2. Bilateral upper lobe solid pulmonary nodules, largest 6 mm, all stable since 02/22/2018 neck CT, more likely benign. Suggest follow-up chest CT in 12 months to demonstrate continued stability. 3. Infiltrative right neck mass involving the larynx and cervical esophagus, as detailed on neck CT from 1 day prior. 4.  Well-positioned tracheostomy tube. 5. Three-vessel coronary atherosclerosis. Aortic Atherosclerosis (ICD10-I70.0) and Emphysema (ICD10-J43.9). Electronically Signed   By: Ilona Sorrel M.D.   On: 09/29/2018 13:41   Ir Gastrostomy Tube Mod Sed  Result Date: 10/04/2018 INDICATION: 73 year old male with dysphagia EXAM: PERC PLACEMENT GASTROSTOMY MEDICATIONS: 2 g Ancef ANESTHESIA/SEDATION: Versed 0.5 mg IV; Fentanyl 25 mcg IV Moderate Sedation Time:  0 The patient was continuously monitored during the procedure by the interventional radiology nurse under my direct supervision. CONTRAST:  None FLUOROSCOPY TIME:  Fluoroscopy Time: 0 minutes 30 seconds (4 mGy). COMPLICATIONS: None immediate. PROCEDURE: Informed written consent was obtained from the patient's family after a thorough discussion of the procedural risks, benefits and alternatives. All questions were addressed. Maximal Sterile Barrier Technique was utilized including caps, mask, sterile gowns, sterile gloves, sterile drape, hand hygiene and skin antiseptic. A timeout was performed prior to the initiation of the procedure. The epigastrium was prepped with Betadine in a sterile fashion, and a sterile drape was applied covering the operative field. A sterile gown and sterile gloves were used for the procedure. A 5-French orogastric tube is placed under fluoroscopic guidance. Scout imaging of the abdomen confirms barium within the transverse colon. The stomach was distended with gas. Multiple x-ray performed with insufflation of the stomach. The partially barium filled colon did not migrate caudally with insufflation of the stomach. X-rays were performed. We withdrew from the procedure given the anatomy. Patient tolerated the procedure well and remained hemodynamically stable throughout. No complications were encountered and no significant blood loss encountered. IMPRESSION: Attempt at percutaneous gastrostomy demonstrates insufficient anatomy for image guided  placement. The transverse colon did not migrate caudally with insufflation of the stomach. Signed, Dulcy Fanny. Earleen Newport, DO [facility] Electronically Signed   By: Corrie Mckusick D.O.   On: 10/04/2018 14:25   Dg Chest Port 1 View  Result Date: 10/11/2018 CLINICAL DATA:  Nausea and vomiting yesterday.  New tracheostomy. EXAM: PORTABLE CHEST 1 VIEW COMPARISON:  Chest CT 09/29/2018 FINDINGS: Tracheostomy tube appears in adequate position. Right IJ Port-A-Cath has tip over the SVC. Lungs are adequately inflated without focal airspace consolidation or effusion. No pneumothorax. Cardiomediastinal silhouette is within normal. Dense radiopaque material/calcifications over the posteromedial right lower lobe as seen on CT scan. Remainder of the exam is unchanged. IMPRESSION: No acute cardiopulmonary  disease. Tracheostomy tube and right IJ Port-A-Cath in adequate position. Electronically Signed   By: Marin Olp M.D.   On: 10/11/2018 15:40   Dg Abd Portable 1v  Result Date: 10/11/2018 CLINICAL DATA:  New tracheostomy tube. Nausea and vomiting yesterday. EXAM: PORTABLE ABDOMEN - 1 VIEW COMPARISON:  10/05/2018 FINDINGS: Percutaneous gastrostomy tube projects over the stomach in the left upper quadrant. Bowel gas pattern is nonobstructive. No free peritoneal air. Remainder of the exam is unchanged. IMPRESSION: Nonobstructive bowel gas pattern. Gastrostomy tube projects over the stomach in the left upper quadrant. Electronically Signed   By: Marin Olp M.D.   On: 10/11/2018 15:41   Dg Abd Portable 1v  Result Date: 10/03/2018 CLINICAL DATA:  Dysphagia. EXAM: PORTABLE ABDOMEN - 1 VIEW COMPARISON:  Radiograph of October 02, 2018. FINDINGS: The bowel gas pattern is normal. Residual contrast is noted throughout the colon. No radio-opaque calculi or other significant radiographic abnormality are seen. IMPRESSION: No evidence of bowel obstruction or ileus. Electronically  Signed   By: Marijo Conception M.D.   On: 10/03/2018 07:52   Dg Abd Portable 1v  Result Date: 10/02/2018 CLINICAL DATA:  Nasogastric tube advancement. EXAM: PORTABLE ABDOMEN - 1 VIEW COMPARISON:  10/02/2018 at 1839 hours FINDINGS: The nasogastric tube has been advanced. Tip now extends further into the stomach with the side hole entering the proximal stomach. Normal bowel gas pattern. IMPRESSION: 1. Advanced nasogastric tube, now fully within the stomach. Electronically Signed   By: Lajean Manes M.D.   On: 10/02/2018 20:45   Ir Imaging Guided Port Insertion  Result Date: 10/04/2018 INDICATION: 73 year old male with a history squamous cell carcinoma and dysphagia EXAM: IMPLANTED PORT A CATH PLACEMENT WITH ULTRASOUND AND FLUOROSCOPIC GUIDANCE MEDICATIONS: 2 g Ancef; The antibiotic was administered within an appropriate time interval prior to skin puncture. ANESTHESIA/SEDATION: Moderate (conscious) sedation was employed during this procedure. A total of Versed 1.0 mg and Fentanyl 25 mcg was administered intravenously. Moderate Sedation Time: 19 minutes. The patient's level of consciousness and vital signs were monitored continuously by radiology nursing throughout the procedure under my direct supervision. FLUOROSCOPY TIME:  0 minutes, 6 seconds (1 mGy) COMPLICATIONS: None PROCEDURE: The procedure, risks, benefits, and alternatives were explained to the patient. Questions regarding the procedure were encouraged and answered. The patient understands and consents to the procedure. Ultrasound survey was performed with images stored and sent to PACs. The right neck and chest was prepped with chlorhexidine, and draped in the usual sterile fashion using maximum barrier technique (cap and mask, sterile gown, sterile gloves, large sterile sheet, hand hygiene and cutaneous antiseptic). Antibiotic prophylaxis was provided with 2.0g Ancef administered IV one hour prior to skin incision. Local anesthesia was attained by  infiltration with 1% lidocaine without epinephrine. Ultrasound demonstrated patency of the right internal jugular vein, and this was documented with an image. Under real-time ultrasound guidance, this vein was accessed with a 21 gauge micropuncture needle and image documentation was performed. A small dermatotomy was made at the access site with an 11 scalpel. A 0.018" wire was advanced into the SVC and used to estimate the length of the internal catheter. The access needle exchanged for a 34F micropuncture vascular sheath. The 0.018" wire was then removed and a 0.035" wire advanced into the IVC. An appropriate location for the subcutaneous reservoir was selected below the clavicle and an incision was made through the skin and underlying soft tissues. The subcutaneous tissues were then dissected using a combination of blunt and  sharp surgical technique and a pocket was formed. A single lumen power injectable portacatheter was then tunneled through the subcutaneous tissues from the pocket to the dermatotomy and the port reservoir placed within the subcutaneous pocket. The venous access site was then serially dilated and a peel away vascular sheath placed over the wire. The wire was removed and the port catheter advanced into position under fluoroscopic guidance. The catheter tip is positioned in the cavoatrial junction. This was documented with a spot image. The portacatheter was then tested and found to flush and aspirate well. The port was flushed with saline followed by 100 units/mL heparinized saline. The pocket was then closed in two layers using first subdermal inverted interrupted absorbable sutures followed by a running subcuticular suture. The epidermis was then sealed with Dermabond. The dermatotomy at the venous access site was also seal with Dermabond. Patient tolerated the procedure well and remained hemodynamically stable throughout. No complications encountered and no significant blood loss encountered  IMPRESSION: Status post port catheter placement.  Catheter ready for use. Signed, Dulcy Fanny. Dellia Nims, RPVI [facility] Radiology Electronically Signed   By: Corrie Mckusick D.O.   On: 10/04/2018 14:21   Dg Esophagus W Double Cm (hd)  Result Date: 09/20/2018 CLINICAL DATA:  Pain at RIGHT side of throat neck for 2 years, feels like food is getting stuck in throat EXAM: ESOPHOGRAM / BARIUM SWALLOW / BARIUM TABLET STUDY TECHNIQUE: Combined double contrast and single contrast examination performed using effervescent crystals, thick barium liquid, and thin barium liquid. The patient was observed with fluoroscopy swallowing a 13 mm barium sulphate tablet. Exam was terminated prematurely due to findings. FLUOROSCOPY TIME:  Fluoroscopy Time:  1 minutes 18 seconds Radiation Exposure Index (if provided by the fluoroscopic device): 2.4 mGy Number of Acquired Spot Images: multiple fluoroscopic screen captures COMPARISON:  None FINDINGS: Esophageal distention: Normal distention without mass or stricture Filling defects:  See below 12.5 mm barium tablet: Passed from oral cavity to stomach without obstruction Motility:  Normal for age Mucosa:  Esophageal mucosa smooth.  Abnormal hypopharynx see below Hypopharynx/cervical esophagus: Laryngeal penetration and silent gross aspiration of contrast into trachea. Contrast passed caudally into the RIGHT mainstem bronchus and RIGHT lower lobe medially with alveolar opacification. Large polypoid filling defect within the hypopharynx primarily to the RIGHT and anteriorly, extending from above the epiglottis to the RIGHT piriform sinus. Irregular macrolobulated border with scattered crevices. Epiglottis not clearly identified. Swallowed contrast courses along the LEFT lateral margin of the pharynx and hypopharynx, with the LEFT lateral wall mucosa appearing smooth. Narrowing at the hypopharynx on AP view. Hiatal hernia:  Absent GE reflux:   Not identified during exam Other:  N/A IMPRESSION: Large polypoid macrolobulated mass within hypopharynx, predominantly on RIGHT and extending from above epiglottis to the RIGHT piriform sinus, with loss of epiglottic silhouette. Associated narrowing of the hypopharynx with deviation of swallowed liquids to the LEFT. 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 Gross laryngeal penetration and silent aspiration of contrast material into trachea, with contrast coursing inferiorly into RIGHT lower lobe bronchus and alveoli. Electronically Signed   By: Lavonia Dana M.D.   On: 09/20/2018 11:13    Labs:  CBC: Recent Labs    10/10/18 0603 10/11/18 0416 10/13/18 0901 10/14/18 0252  WBC 21.2* 23.7* 10.3 11.7*  HGB 11.6* 11.2* 10.1* 10.8*  HCT 36.1* 34.3* 31.2* 32.8*  PLT 328 305 246 247    COAGS: Recent Labs    10/03/18  0400 10/14/18 0252  INR 1.2 1.3*    BMP: Recent Labs    10/10/18 0603 10/11/18 0416 10/12/18 0226 10/13/18 1126  NA 132* 129* 129* 132*  K 4.1 4.6 4.5 4.7  CL 94* 94* 96* 100  CO2 26 24 24  21*  GLUCOSE 161* 150* 144* 174*  BUN 14 18 27* 54*  CALCIUM 8.9 8.7* 8.1* 8.0*  CREATININE 0.67 0.94 1.19 3.63*  GFRNONAA >60 >60 >60 16*  GFRAA >60 >60 >60 18*    LIVER FUNCTION TESTS: Recent Labs    10/23/17 0905 04/25/18 0919  BILITOT 0.4 0.4  AST 20 21  ALT 11 16  ALKPHOS 49 60  PROT 6.7 7.2  ALBUMIN 4.4 4.6     Assessment and Plan:  Bacteremia. Plan for Port-a-cath removal tentatively for today with Dr. Kathlene Cote. Patient is NPO. Afebrile. He does not take blood thinners. INR 1.3 today.  Risks and benefits of image guided port-a-catheter removal were discussed with the patient including, but not limited to bleeding, infection, or fibrin sheath development and need for additional procedures. All of the patient's questions were answered, patient is agreeable to proceed. Consent signed and in chart.   Thank you for this interesting  consult.  I greatly enjoyed meeting DAREN YEAGLE and look forward to participating in their care.  A copy of this report was sent to the requesting provider on this date.  Electronically Signed: Earley Abide, PA-C 10/14/2018, 11:15 AM   I spent a total of 40 Minutes in face to face in clinical consultation, greater than 50% of which was counseling/coordinating care for bacteremia/Port-a-cath removal.

## 2018-10-14 NOTE — Progress Notes (Signed)
[facility] for Infectious Disease  Date of Admission:  09/27/2018     Total days of antibiotics 5        ASSESSMENT/PLAN  Mr. 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  Most likely source of infection being his port which was placed on 7/10 and currently on cefepime.  Pseudomonas aeruginosa bacteremia -most likely source being previously inserted port on 10/04/2018.  Removal of port scheduled for today which will likely achieve source control.  Susceptibilities are pansensitive and will continue current dose of cefepime.  He will need at least 14 days of IV antibiotics following removal of port and clearance of blood cultures. Plan for blood cultures tonight.   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  Continue management per otolaryngology.  PSVT - Normal sinus rhythm present of evaluation. Cardiology following.     Principal Problem:   Squamous cell carcinoma of glottis (HCC) Active Problems:   Laryngeal mass   Goals of care, counseling/discussion   Dysphagia   Loss of weight   PSVT (paroxysmal supraventricular tachycardia) (HCC)   Protein-calorie malnutrition, severe   . chlorhexidine  15 mL Mouth Rinse BID  . doxazosin  1 mg Per Tube Daily  . feeding supplement (OSMOLITE 1.5 CAL)  237 mL Per Tube 6 X Daily  . feeding supplement (PRO-STAT SUGAR FREE 64)  30 mL Per Tube BID  . hydrocortisone   Rectal QID  . ipratropium-albuterol  3 mL Nebulization Q6H  . lidocaine  1 application Urethral Once  . mouth rinse  15 mL Mouth Rinse q12n4p  . metoprolol tartrate  25 mg Per Tube QID  . simethicone  160 mg Per Tube QID    SUBJECTIVE:  Afebrile overnight with improved leukocytosis with most recent white blood cell count  of 11.7.  Most recent creatinine from 7/19 of 3.63. Doing well today. Has questions about when he will be ready to go home.   No Known Allergies   Review of Systems: Review of Systems  Constitutional: Negative for chills, fever and weight loss.  Respiratory: Positive for sputum production. Negative for cough, shortness of breath and wheezing.   Cardiovascular: Negative for chest pain and leg swelling.  Gastrointestinal: Negative for abdominal pain, constipation, diarrhea, nausea and vomiting.  Skin: Negative for rash.      OBJECTIVE: Vitals:   10/14/18 0851 10/14/18 0856 10/14/18 0900 10/14/18 1032  BP: (!) 152/69   (!) 152/69  Pulse: 79  80   Resp: (!) 26  (!) 22   Temp: 97.8 F (36.6 C)     TempSrc: Oral     SpO2: 97% 97% 94%   Weight:      Height:       Body mass index is 23.08 kg/m.  Physical Exam Constitutional:      General: He is not in acute distress.    Appearance: He is well-developed.     Comments: Lying in bed with head of bed elevated; pleasant.   Cardiovascular:     Rate and Rhythm: Normal rate and regular rhythm.     Heart sounds: Normal heart sounds.  Pulmonary:     Effort: Pulmonary effort is normal.     Breath sounds: Rhonchi present.     Comments: Tracheostomy patent on trach collar. Able to cough up secretions.  Skin:  General: Skin is warm and dry.  Neurological:     Mental Status: He is alert.  Psychiatric:        Mood and Affect: Mood normal.     Lab Results Lab Results  Component Value Date   WBC 11.7 (H) 10/14/2018   HGB 10.8 (L) 10/14/2018   HCT 32.8 (L) 10/14/2018   MCV 86.1 10/14/2018   PLT 247 10/14/2018    Lab Results  Component Value Date   CREATININE 3.63 (H) 10/13/2018   BUN 54 (H) 10/13/2018   NA 132 (L) 10/13/2018   K 4.7 10/13/2018   CL 100 10/13/2018   CO2 21 (L) 10/13/2018    Lab Results  Component Value Date   ALT 16 04/25/2018   AST 21 04/25/2018   ALKPHOS 60 04/25/2018   BILITOT 0.4 04/25/2018      Microbiology: Recent Results (from the past 240 hour(s))  Gastrointestinal Panel by PCR , Stool     Status: None   Collection Time: 10/10/18  1:33 AM   Specimen: Stool  Result Value Ref Range Status   Campylobacter species NOT DETECTED NOT DETECTED Final   Plesimonas shigelloides NOT DETECTED NOT DETECTED Final   Salmonella species NOT DETECTED NOT DETECTED Final   Yersinia enterocolitica NOT DETECTED NOT DETECTED Final   Vibrio species NOT DETECTED NOT DETECTED Final   Vibrio cholerae NOT DETECTED NOT DETECTED Final   Enteroaggregative E coli (EAEC) NOT DETECTED NOT DETECTED Final   Enteropathogenic E coli (EPEC) NOT DETECTED NOT DETECTED Final   Enterotoxigenic E coli (ETEC) NOT DETECTED NOT DETECTED Final   Shiga like toxin producing E coli (STEC) NOT DETECTED NOT DETECTED Final   Shigella/Enteroinvasive E coli (EIEC) NOT DETECTED NOT DETECTED Final   Cryptosporidium NOT DETECTED NOT DETECTED Final   Cyclospora cayetanensis NOT DETECTED NOT DETECTED Final   Entamoeba histolytica NOT DETECTED NOT DETECTED Final   Giardia lamblia NOT DETECTED NOT DETECTED Final   Adenovirus F40/41 NOT DETECTED NOT DETECTED Final   Astrovirus NOT DETECTED NOT DETECTED Final   Norovirus GI/GII NOT DETECTED NOT DETECTED Final   Rotavirus A NOT DETECTED NOT DETECTED Final   Sapovirus (I, II, IV, and V) NOT DETECTED NOT DETECTED Final    Comment: Performed at [facility], [address] [place], [place] [address]  Culture, blood (routine x 2)     Status: Abnormal   Collection Time: 10/11/18  6:47 AM   Specimen: BLOOD LEFT HAND  Result Value Ref Range Status   Specimen Description BLOOD LEFT HAND  Final   Special Requests   Final    BOTTLES DRAWN AEROBIC ONLY Blood Culture results may not be optimal due to an inadequate volume of blood received in culture bottles   Culture  Setup Time   Final    AEROBIC BOTTLE ONLY GRAM NEGATIVE RODS CRITICAL RESULT CALLED TO, READ BACK BY AND VERIFIED  WITH: Karsten Ro [facility] 10/12/18 0425 JDW Performed at [facility], [address], [place], [name] [address]    Culture PSEUDOMONAS AERUGINOSA (A)  Final   Report Status 10/14/2018 FINAL  Final   Organism ID, Bacteria PSEUDOMONAS AERUGINOSA  Final      Susceptibility   Pseudomonas aeruginosa - MIC*    CEFTAZIDIME 4 SENSITIVE Sensitive     CIPROFLOXACIN <=0.25 SENSITIVE Sensitive     GENTAMICIN <=1 SENSITIVE Sensitive     IMIPENEM <=0.25 SENSITIVE Sensitive     PIP/TAZO 8 SENSITIVE Sensitive  CEFEPIME 2 SENSITIVE Sensitive     * PSEUDOMONAS AERUGINOSA  Blood Culture ID Panel (Reflexed)     Status: Abnormal   Collection Time: 10/11/18  6:47 AM  Result Value Ref Range Status   Enterococcus species NOT DETECTED NOT DETECTED Final   Listeria monocytogenes NOT DETECTED NOT DETECTED Final   Staphylococcus species NOT DETECTED NOT DETECTED Final   Staphylococcus aureus (BCID) NOT DETECTED NOT DETECTED Final   Streptococcus species NOT DETECTED NOT DETECTED Final   Streptococcus agalactiae NOT DETECTED NOT DETECTED Final   Streptococcus pneumoniae NOT DETECTED NOT DETECTED Final   Streptococcus pyogenes NOT DETECTED NOT DETECTED Final   Acinetobacter baumannii NOT DETECTED NOT DETECTED Final   Enterobacteriaceae species NOT DETECTED NOT DETECTED Final   Enterobacter cloacae complex NOT DETECTED NOT DETECTED Final   Escherichia coli NOT DETECTED NOT DETECTED Final   Klebsiella oxytoca NOT DETECTED NOT DETECTED Final   Klebsiella pneumoniae NOT DETECTED NOT DETECTED Final   Proteus species NOT DETECTED NOT DETECTED Final   Serratia marcescens NOT DETECTED NOT DETECTED Final   Carbapenem resistance NOT DETECTED NOT DETECTED Final   Haemophilus influenzae NOT DETECTED NOT DETECTED Final   Neisseria meningitidis NOT DETECTED NOT DETECTED Final   Pseudomonas aeruginosa DETECTED (A) NOT DETECTED Final    Comment: CRITICAL RESULT CALLED TO, READ BACK BY AND VERIFIED WITH: J LEDFORD  PHARMD 10/12/18 0425 JDW    Candida albicans NOT DETECTED NOT DETECTED Final   Candida glabrata NOT DETECTED NOT DETECTED Final   Candida krusei NOT DETECTED NOT DETECTED Final   Candida parapsilosis NOT DETECTED NOT DETECTED Final   Candida tropicalis NOT DETECTED NOT DETECTED Final    Comment: Performed at [facility], [address], [place], [place] [address]  Culture, blood (routine x 2)     Status: Abnormal   Collection Time: 10/11/18  7:45 AM   Specimen: BLOOD  Result Value Ref Range Status   Specimen Description BLOOD RIGHT ANTECUBITAL  Final   Special Requests   Final    BOTTLES DRAWN AEROBIC ONLY Blood Culture adequate volume   Culture  Setup Time   Final    AEROBIC BOTTLE ONLY GRAM NEGATIVE RODS CRITICAL VALUE NOTED.  VALUE IS CONSISTENT WITH PREVIOUSLY REPORTED AND CALLED VALUE.    Culture (A)  Final    PSEUDOMONAS AERUGINOSA SUSCEPTIBILITIES PERFORMED ON PREVIOUS CULTURE WITHIN THE LAST 5 DAYS. Performed at [facility], [place] [address] [place], [place] [address]    Report Status 10/14/2018 FINAL  Final  Culture, respiratory (non-expectorated)     Status: None (Preliminary result)   Collection Time: 10/13/18  4:26 AM   Specimen: Tracheal Aspirate; Respiratory  Result Value Ref Range Status   Specimen Description TRACHEAL ASPIRATE  Final   Special Requests NONE  Final   Gram Stain NO WBC SEEN NO ORGANISMS SEEN   Final   Culture   Final    CULTURE REINCUBATED FOR BETTER GROWTH Performed at [facility], [address] [place], [place] [address]    Report Status PENDING  Incomplete     Terri Piedra, [place] [facility] [place] Pager  10/14/2018  11:30 AM

## 2018-10-14 NOTE — Progress Notes (Signed)
Spoke with pt's spouse regarding condition. All questions answered. Will be back in touch late this afternoon for another update.  Jerald Kief, RN

## 2018-10-14 NOTE — Progress Notes (Signed)
Progress Note  Patient Name: Logan Price Date of Encounter: 10/14/2018  Primary Cardiologist: Dr. Bronson Ing  Subjective   Doing well today. No chest pain or shortness of breath. Did not feel when he had brief episode of tachycardia yesterday.  Inpatient Medications    Scheduled Meds: . chlorhexidine  15 mL Mouth Rinse BID  . doxazosin  1 mg Per Tube Daily  . feeding supplement (OSMOLITE 1.5 CAL)  237 mL Per Tube 6 X Daily  . feeding supplement (PRO-STAT SUGAR FREE 64)  30 mL Per Tube BID  . hydrocortisone   Rectal QID  . ipratropium-albuterol  3 mL Nebulization Q6H  . lidocaine  1 application Urethral Once  . mouth rinse  15 mL Mouth Rinse q12n4p  . metoprolol tartrate  25 mg Per Tube QID  . simethicone  160 mg Per Tube QID   Continuous Infusions: . sodium chloride 75 mL/hr at 10/13/18 1916  . ceFEPime (MAXIPIME) IV    . lactated ringers 10 mL/hr at 10/07/18 0859   PRN Meds: ipratropium-albuterol, metoprolol tartrate, morphine injection, ondansetron (ZOFRAN) IV, promethazine   Vital Signs    Vitals:   10/14/18 0350 10/14/18 0400 10/14/18 0426 10/14/18 0550  BP: (!) 141/72   137/63  Pulse: 92 97 97 93  Resp: (!) 26 (!) 21 20 (!) 24  Temp: 98.1 F (36.7 C)     TempSrc: Oral     SpO2: 94% 94% 94% 96%  Weight:   70.9 kg   Height:        Intake/Output Summary (Last 24 hours) at 10/14/2018 0837 Last data filed at 10/14/2018 0300 Gross per 24 hour  Intake 787 ml  Output -  Net 787 ml   Last 3 Weights 10/14/2018 10/13/2018 10/12/2018  Weight (lbs) 156 lb 4.9 oz 153 lb 7 oz 149 lb 14.6 oz  Weight (kg) 70.9 kg 69.6 kg 68 kg      Telemetry    No significant PVCs since 7/18. On 7/19 had clear on/offset of atach from 10:46 AM to 11:59 AM - Personally Reviewed  ECG    Most recent ECG 10/13/18 sinus tach with PACs and PACs with aberrancy vs. PVCs - Personally Reviewed  Physical Exam   GEN: No acute distress. Frail appearing.  Neck: No visible JVD Cardiac: RRR,  no murmurs, rubs, or gallops.  Respiratory: Tracheostomy in place. Coarse breath sounds throughout. GI: Soft, nontender, non-distended. PEG tube in place MS: No edema; No deformity. Neuro:  Nonfocal  Psych: Normal affect   Labs    High Sensitivity Troponin:  No results for input(s): TROPONINIHS in the last 720 hours.    Cardiac EnzymesNo results for input(s): TROPONINI in the last 168 hours. No results for input(s): TROPIPOC in the last 168 hours.   Chemistry Recent Labs  Lab 10/11/18 0416 10/12/18 0226 10/13/18 1126  NA 129* 129* 132*  K 4.6 4.5 4.7  CL 94* 96* 100  CO2 24 24 21*  GLUCOSE 150* 144* 174*  BUN 18 27* 54*  CREATININE 0.94 1.19 3.63*  CALCIUM 8.7* 8.1* 8.0*  GFRNONAA >60 >60 16*  GFRAA >60 >60 18*  ANIONGAP 11 9 11      Hematology Recent Labs  Lab 10/11/18 0416 10/13/18 0901 10/14/18 0252  WBC 23.7* 10.3 11.7*  RBC 3.92* 3.58* 3.81*  HGB 11.2* 10.1* 10.8*  HCT 34.3* 31.2* 32.8*  MCV 87.5 87.2 86.1  MCH 28.6 28.2 28.3  MCHC 32.7 32.4 32.9  RDW 13.5 13.9 13.9  PLT 305 246 247    BNPNo results for input(s): BNP, PROBNP in the last 168 hours.   DDimer No results for input(s): DDIMER in the last 168 hours.   Radiology    No results found.  Cardiac Studies   Echo 10/03/2018  1. The left ventricle has normal systolic function with an ejection fraction of 60-65%. The cavity size was normal. Left ventricular diastolic parameters were normal.  2. The right ventricle has normal systolic function. The cavity was normal. There is no increase in right ventricular wall thickness. Right ventricular systolic pressure could not be assessed.  3. Small pericardial effusion.  4. The pericardial effusion is anterior to the right ventricle.  5. The mitral valve is grossly normal. No evidence of mitral valve stenosis.  6. The aortic valve is tricuspid. No stenosis of the aortic valve.  7. The aortic root and descending aorta are normal in size and structure.   SUMMARY  Normal LV EF. Small pericardial effusion localized anterior to RV, no echo evidence of tamponade. On Image 95, may have fibrin/adherent clot on RV surface within pericardial space (otherwise not well visualized).  Patient Profile     73 y.o. male with PMH HTN, HLD, paroxysmal SVT, laryngeal cancer who is seen at the request of Dr. Benjamine Mola for tachycardia.  Assessment & Plan    Tachycardia: history of paroxysmal SVT that terminated with adenosine -ECGs consistent with pSVT, likely rate related aberrancy as well -TSH normal, monitoring K/Mg -on metoprolol suspension 25 mg QID. Would continue, there is not a suspension of long acting succinate that I know of.  -reviewed telemetry and ECGs. No clear fib/flutter that would require anticoagulation. Appears to be atrial tachycardia given clear on/offset -likely exacerbated by infection  Laryngeal cancer, squamous cell, s/p tracheostomy and PEG tube Bacteremia, pseudomonas, likely from infected port Acute kidney injury today, with Crt to 3.63 from avg 0.7-1 this admission Severe protein calorie malnutrition Hyponatremia (improving, from 129 -> 132 today) -per primary team/ENT/ID  [facility] will sign off.   Medication Recommendations:  Continue metoprolol suspension via PEG tube at 25 mg QID Other recommendations (labs, testing, etc):  none Follow up as an outpatient:  We will arrange for outpatient follow up with Dr. Bronson Ing  For questions or updates, please contact [place] Please consult [email or web address] for contact info under     Signed, Buford Dresser, MD  10/14/2018, 8:37 AM

## 2018-10-14 NOTE — Progress Notes (Signed)
OT Cancellation Note  Patient Details Name: Logan Price MRN: [id number] DOB: [birth year]   Cancelled Treatment:    Reason Eval/Treat Not Completed: Patient declined, no reason specified (pt declined participation, reporting "not today"). Educated on importance of mobility, offered bed mobility and UE exercises supine but patient continued to decline. Will follow acutely.  Delight Stare, OT Acute Rehabilitation Services Pager [phone] Office [phone]   Delight Stare 10/14/2018, 2:45 PM

## 2018-10-14 NOTE — Progress Notes (Signed)
Bladder scan patient  Got greater than 1120 ml . Abdo is distended and firm. Patient is not uncomfort. Text page Tylene Fantasia N.P. The results

## 2018-10-14 NOTE — Progress Notes (Signed)
RT NOTE: patient currently unavailable and not in room for 12 o'clock check. RT asked RN to notify RT when patient returns. RT will continue to monitor.

## 2018-10-14 NOTE — Progress Notes (Addendum)
PROGRESS NOTE    Logan Price  ZOX:[id number]  DOB: [birth year]  DOA: 09/27/2018 PCP: [name], FNP  Medical consult follow-up: 72 year old male with history of GERD, hypertension, hyperlipidemia admitted to ENT service initially for laryngeal cancer, now status post surgery/trach/G-tube placement.  Hospital course complicated by SVT, diarrhea, nausea, vomiting, leukocytosis and now with positive blood cultures for Pseudomonas.  Patient initially on IV Levaquin and subsequently changed to IV cefepime on 7/17.   Now receiving tube feeds via PEG.  GI viral panel resulted negative.  Hospitalist service was initially consulting but now transferred to our service per ENT request given recurrent SVT over the weekend and Pseudomonas bacteremia.  Subjective: No further SVT since episode yesterday morning.  Seems improved today.  On trach collar.  Afebrile in the last 24 hours.  Scheduled for Port-A-Cath removal through IR today  Objective: Vitals:   10/14/18 1500 10/14/18 1611 10/14/18 1612 10/14/18 1634  BP:  (!) 145/66    Pulse: 93 87 88 96  Resp: 20 (!) 22 (!) 24 19  Temp:  98.2 F (36.8 C)    TempSrc:  Oral    SpO2: 96% 99% 100% 97%  Weight:      Height:        Intake/Output Summary (Last 24 hours) at 10/14/2018 1747 Last data filed at 10/14/2018 1541 Gross per 24 hour  Intake 2229.25 ml  Output 251 ml  Net 1978.25 ml   Filed Weights   10/12/18 0500 10/13/18 0412 10/14/18 0426  Weight: 68 kg 69.6 kg 70.9 kg    Physical Examination:  General exam: Appears calm and, status post trach, self suctioning Respiratory system: s/p trach with trach collar, upper airway secretions/rhonchi, otherwise Clear to auscultation. Respiratory effort normal. Cardiovascular system: S1 & S2 heard, tachycardic. No JVD, murmurs, rubs, gallops or clicks. No pedal edema. + Porta cath on RACW Gastrointestinal system: S/P PEG, Abdomen is nondistended, soft and nontender. No organomegaly or masses  felt. Normal bowel sounds heard. Central nervous system: Alert and oriented. No focal neurological deficits. Extremities: Symmetric 5 x 5 power. Skin: No rashes, lesions or ulcers Psychiatry: Judgement and insight appear normal. Mood & affect appropriate.     Data Reviewed: I have personally reviewed following labs and imaging studies  CBC: Recent Labs  Lab 10/10/18 0603 10/11/18 0416 10/13/18 0901 10/14/18 0252  WBC 21.2* 23.7* 10.3 11.7*  NEUTROABS 19.1* 20.8*  --   --   HGB 11.6* 11.2* 10.1* 10.8*  HCT 36.1* 34.3* 31.2* 32.8*  MCV 88.7 87.5 87.2 86.1  PLT 328 305 246 247   Basic Metabolic Panel: Recent Labs  Lab 10/09/18 0359 10/10/18 0603 10/11/18 0416 10/12/18 0226 10/13/18 1126  NA 138 132* 129* 129* 132*  K 4.6 4.1 4.6 4.5 4.7  CL 103 94* 94* 96* 100  CO2 26 26 24 24  21*  GLUCOSE 114* 161* 150* 144* 174*  BUN 10 14 18  27* 54*  CREATININE 0.67 0.67 0.94 1.19 3.63*  CALCIUM 8.8* 8.9 8.7* 8.1* 8.0*  MG 1.8 1.5* 2.6* 2.0 2.1  PHOS 4.1 3.5 5.2* 4.0 4.2   GFR: Estimated Creatinine Clearance: 18.7 mL/min (A) (by C-G formula based on SCr of 3.63 mg/dL (H)). Liver Function Tests: No results for input(s): AST, ALT, ALKPHOS, BILITOT, PROT, ALBUMIN in the last 168 hours. No results for input(s): LIPASE, AMYLASE in the last 168 hours. No results for input(s): AMMONIA in the last 168 hours. Coagulation Profile: Recent Labs  Lab 10/14/18 0252  INR 1.3*   Cardiac Enzymes: No results for input(s): CKTOTAL, CKMB, CKMBINDEX, TROPONINI in the last 168 hours. BNP (last 3 results) No results for input(s): PROBNP in the last 8760 hours. HbA1C: No results for input(s): HGBA1C in the last 72 hours. CBG: Recent Labs  Lab 10/14/18 0017 10/14/18 0419 10/14/18 0844 10/14/18 1109 10/14/18 1607  GLUCAP 159* 128* 125* 122* 158*   Lipid Profile: No results for input(s): CHOL, HDL, LDLCALC, TRIG, CHOLHDL, LDLDIRECT in the last 72 hours. Thyroid Function Tests: No  results for input(s): TSH, T4TOTAL, FREET4, T3FREE, THYROIDAB in the last 72 hours. Anemia Panel: No results for input(s): VITAMINB12, FOLATE, FERRITIN, TIBC, IRON, RETICCTPCT in the last 72 hours. Sepsis Labs: Recent Labs  Lab 10/11/18 0648  PROCALCITON <0.10  LATICACIDVEN 1.5    Recent Results (from the past 240 hour(s))  Gastrointestinal Panel by PCR , Stool     Status: None   Collection Time: 10/10/18  1:33 AM   Specimen: Stool  Result Value Ref Range Status   Campylobacter species NOT DETECTED NOT DETECTED Final   Plesimonas shigelloides NOT DETECTED NOT DETECTED Final   Salmonella species NOT DETECTED NOT DETECTED Final   Yersinia enterocolitica NOT DETECTED NOT DETECTED Final   Vibrio species NOT DETECTED NOT DETECTED Final   Vibrio cholerae NOT DETECTED NOT DETECTED Final   Enteroaggregative E coli (EAEC) NOT DETECTED NOT DETECTED Final   Enteropathogenic E coli (EPEC) NOT DETECTED NOT DETECTED Final   Enterotoxigenic E coli (ETEC) NOT DETECTED NOT DETECTED Final   Shiga like toxin producing E coli (STEC) NOT DETECTED NOT DETECTED Final   Shigella/Enteroinvasive E coli (EIEC) NOT DETECTED NOT DETECTED Final   Cryptosporidium NOT DETECTED NOT DETECTED Final   Cyclospora cayetanensis NOT DETECTED NOT DETECTED Final   Entamoeba histolytica NOT DETECTED NOT DETECTED Final   Giardia lamblia NOT DETECTED NOT DETECTED Final   Adenovirus F40/41 NOT DETECTED NOT DETECTED Final   Astrovirus NOT DETECTED NOT DETECTED Final   Norovirus GI/GII NOT DETECTED NOT DETECTED Final   Rotavirus A NOT DETECTED NOT DETECTED Final   Sapovirus (I, II, IV, and V) NOT DETECTED NOT DETECTED Final    Comment: Performed at [facility], [address] [place], [place] [address]  Culture, blood (routine x 2)     Status: Abnormal   Collection Time: 10/11/18  6:47 AM   Specimen: BLOOD LEFT HAND  Result Value Ref Range Status   Specimen Description BLOOD LEFT HAND  Final   Special Requests    Final    BOTTLES DRAWN AEROBIC ONLY Blood Culture results may not be optimal due to an inadequate volume of blood received in culture bottles   Culture  Setup Time   Final    AEROBIC BOTTLE ONLY GRAM NEGATIVE RODS CRITICAL RESULT CALLED TO, READ BACK BY AND VERIFIED WITH: Melven [facility] 10/12/18 0425 JDW Performed at [facility], [address] [place], [place] [address]    Culture PSEUDOMONAS AERUGINOSA (A)  Final   Report Status 10/14/2018 FINAL  Final   Organism ID, Bacteria PSEUDOMONAS AERUGINOSA  Final      Susceptibility   Pseudomonas aeruginosa - MIC*    CEFTAZIDIME 4 SENSITIVE Sensitive     CIPROFLOXACIN <=0.25 SENSITIVE Sensitive     GENTAMICIN <=1 SENSITIVE Sensitive     IMIPENEM <=0.25 SENSITIVE Sensitive     PIP/TAZO 8 SENSITIVE Sensitive     CEFEPIME 2 SENSITIVE Sensitive     * PSEUDOMONAS AERUGINOSA  Blood Culture ID Panel (Reflexed)     Status: Abnormal   Collection Time: 10/11/18  6:47 AM  Result Value Ref Range Status   Enterococcus species NOT DETECTED NOT DETECTED Final   Listeria monocytogenes NOT DETECTED NOT DETECTED Final   Staphylococcus species NOT DETECTED NOT DETECTED Final   Staphylococcus aureus (BCID) NOT DETECTED NOT DETECTED Final   Streptococcus species NOT DETECTED NOT DETECTED Final   Streptococcus agalactiae NOT DETECTED NOT DETECTED Final   Streptococcus pneumoniae NOT DETECTED NOT DETECTED Final   Streptococcus pyogenes NOT DETECTED NOT DETECTED Final   Acinetobacter baumannii NOT DETECTED NOT DETECTED Final   Enterobacteriaceae species NOT DETECTED NOT DETECTED Final   Enterobacter cloacae complex NOT DETECTED NOT DETECTED Final   Escherichia coli NOT DETECTED NOT DETECTED Final   Klebsiella oxytoca NOT DETECTED NOT DETECTED Final   Klebsiella pneumoniae NOT DETECTED NOT DETECTED Final   Proteus species NOT DETECTED NOT DETECTED Final   Serratia marcescens NOT DETECTED NOT DETECTED Final   Carbapenem resistance NOT DETECTED NOT  DETECTED Final   Haemophilus influenzae NOT DETECTED NOT DETECTED Final   Neisseria meningitidis NOT DETECTED NOT DETECTED Final   Pseudomonas aeruginosa DETECTED (A) NOT DETECTED Final    Comment: CRITICAL RESULT CALLED TO, READ BACK BY AND VERIFIED WITH: J LEDFORD PHARMD 10/12/18 0425 JDW    Candida albicans NOT DETECTED NOT DETECTED Final   Candida glabrata NOT DETECTED NOT DETECTED Final   Candida krusei NOT DETECTED NOT DETECTED Final   Candida parapsilosis NOT DETECTED NOT DETECTED Final   Candida tropicalis NOT DETECTED NOT DETECTED Final    Comment: Performed at [facility], [address] [place], [place] [address]  Culture, blood (routine x 2)     Status: Abnormal   Collection Time: 10/11/18  7:45 AM   Specimen: BLOOD  Result Value Ref Range Status   Specimen Description BLOOD RIGHT ANTECUBITAL  Final   Special Requests   Final    BOTTLES DRAWN AEROBIC ONLY Blood Culture adequate volume   Culture  Setup Time   Final    AEROBIC BOTTLE ONLY GRAM NEGATIVE RODS CRITICAL VALUE NOTED.  VALUE IS CONSISTENT WITH PREVIOUSLY REPORTED AND CALLED VALUE.    Culture (A)  Final    PSEUDOMONAS AERUGINOSA SUSCEPTIBILITIES PERFORMED ON PREVIOUS CULTURE WITHIN THE LAST 5 DAYS. Performed at [facility], [address], [place], [place] [address]    Report Status 10/14/2018 FINAL  Final  Culture, respiratory (non-expectorated)     Status: None (Preliminary result)   Collection Time: 10/13/18  4:26 AM   Specimen: Tracheal Aspirate; Respiratory  Result Value Ref Range Status   Specimen Description TRACHEAL ASPIRATE  Final   Special Requests NONE  Final   Gram Stain NO WBC SEEN NO ORGANISMS SEEN   Final   Culture   Final    CULTURE REINCUBATED FOR BETTER GROWTH Performed at [facility], [address] [place], [place] [address]    Report Status PENDING  Incomplete      Radiology Studies: Ir Removal Tun Access W/ [place] W/o [place] Mod Sed  Result Date: 10/14/2018  CLINICAL DATA:  History of head and neck squamous cell carcinoma. Status post right upper chest subcutaneous Port-A-Cath placement on 10/04/2018 with catheter access via the right internal jugular vein. Development Pseudomonas bacteremia now requiring Port-A-Cath removal due to suspected catheter seeding/source for infection. EXAM: REMOVAL OF IMPLANTED TUNNELED PORT-A-CATH MEDICATIONS: None PROCEDURE: The right chest Port-A-Cath site was prepped  with chlorhexidine. A sterile gown and gloves were worn during the procedure. Local anesthesia was provided with 1% lidocaine. An incision was made overlying the Port-A-Cath with a #15 scalpel. Utilizing sharp and blunt dissection, the Port-A-Cath was removed. The pocked was debrided with gauze soaked in sterile saline. Iodoform gauze was then packed into the wound. A dressing was then applied over the iodoform gauze. FINDINGS: After making an incision, the port pocket was inspected and demonstrates bloody fluid which is mildly turbulent and felt to be likely infected. Tissue was also indurated and therefore the pocket was left open to heal secondarily. After debridement, iodoform gauze was packed into the port wound. The entire Port-A-Cath and attached catheter were successfully removed. IMPRESSION: Removal of implanted Port-A-Cath utilizing sharp and blunt dissection. The port pocket demonstrates what appears to be likely infected hematoma. After debridement of bloody fluid and clot, the wound was packed with iodoform gauze. Electronically Signed   By: Irish Lack M.D.   On: 10/14/2018 14:34        Scheduled Meds: . chlorhexidine  15 mL Mouth Rinse BID  . doxazosin  1 mg Per Tube Daily  . feeding supplement (OSMOLITE 1.5 CAL)  237 mL Per Tube 6 X Daily  . feeding supplement (PRO-STAT SUGAR FREE 64)  30 mL Per Tube BID  . fentaNYL      . hydrocortisone   Rectal QID  . ipratropium-albuterol  3 mL Nebulization Q6H  . lidocaine      . lidocaine  1  application Urethral Once  . mouth rinse  15 mL Mouth Rinse q12n4p  . metoprolol tartrate  25 mg Per Tube QID  . midazolam      . simethicone  160 mg Per Tube QID   Continuous Infusions: . sodium chloride 75 mL/hr at 10/14/18 1536  . ceFEPime (MAXIPIME) IV 2 g (10/14/18 0910)  . lactated ringers 10 mL/hr at 10/07/18 0859    Assessment & Plan:    1.  Pseudomonas bacteremia: Source suspected to be Port-A-Cath infection.  Patient also had significant leukocytosis initially but now resolving and afebrile today.  Scheduled for removal of Port-A-Cath through IR today.  Continue IV cefepime.  Appreciate ID evaluation and recommendations.  Tracheal aspirate cultures negative so far.  Repeat Blood cx today , he will need 2 weeks of Abx after removal of Porta cath.   2.  Recurrent SVT: Converted to sinus rhythm with adenosine 6mg  x 1 yesterday.No underlying A.fib noted (EKG pre and post adenosine done) He had required 1 dose of adenosine on 7/9 as well. TSH within normal limits. Echo on 7/9 with preserved EF.  Seen by cardiology and metoprolol dosage increased.  Cardiology did not recommend anticoagulation.  Continue IV metoprolol prn. Keep potassium close to 4 and magnesium to 2.   3.  AKI: Labs from yesterday show AKI.  Secondary to ATN in the setting of infection versus prerenal. Patient received a bolus of lactated Ringer's on early hours of 7/18 for hypotension. Patient been on normal saline over the weekend at 75 mL/h.  Increase IV fluid rate to 125/h.Rule out urinary retention/bladder scan. Not on any diuretics/ ACEI. Repeat labs today/ in a.m.  4.  Nausea/vomiting/diarrhea.:  Now improved.  On tube feeds via PEG.  Abdominal x-ray unremarkable on 7/17.  PRN meds available.  Also on simethicone 4 times daily  5.  Hyponatremia: Likely secondary to problem #4.  Improving with IV hydration.  6.  Mild hyperglycemia: Likely tube feed  induced.  Sliding scale insulin.  Off dextrose fluids now.  7.   Laryngeal CA: S/p trach, now with trach collar 5 lits 02 and status post PEG for dysphagia. Added nebs and continue frequent tracheal suctioning.  Defer further Mx to ENT. Oncology has evaluated patient and had planned for PET scan as outpatient to determine further course of action.  However received call from head and neck oncology nurse, Raiford Noble [phone]) today afternoon who stated that patient is not considered to be a candidate for chemotherapy at this point but radiation oncologist Dr. Lonie Peak [phone]) planning on arranging radiation treatments and would like to evaluate patient/obtain mapping on Wednesday.  Depending on patient's hemodynamic stability, transfer to [facility] long hospitalist service in a.m. versus transport on Wednesday for mapping, to be decided by rounding hospitalist tomorrow.   DVT prophylaxis: Heparin subq after Port-A-Cath removal. Disposition Plan: ENT service arranged for SNF.  May need to be transferred to [facility] long as discussed above. CODE STATUS: Full code  Time spent: 35 minutes   LOS: 17 days        Alessandra Bevels, MD [facility] Pager [phone]  If 7PM-7AM, please contact night-coverage [email or web address] Password [facility] 10/14/2018, 5:47 PM

## 2018-10-14 NOTE — Progress Notes (Signed)
Subjective: Resting in bed. Had SVT yesterday. Diarrhea has improved.  Objective: Vital signs in last 24 hours: Temp:  [97.8 F (36.6 C)-98.5 F (36.9 C)] 97.8 F (36.6 C) (07/20 0851) Pulse Rate:  [79-141] 80 (07/20 0900) Resp:  [20-29] 22 (07/20 0900) BP: (123-155)/(63-84) 152/69 (07/20 1032) SpO2:  [94 %-98 %] 94 % (07/20 0900) FiO2 (%):  [28 %] 28 % (07/20 0856) Weight:  [70.9 kg] 70.9 kg (07/20 0426)  General appearance:NAD. Head:Normocephalic, without obvious abnormality, atraumatic Eyes:conjunctivae/corneas clear. PERRL, EOM's intact.  Ears:normal TM's and external ear canals both ears Nose:Nares normal. Septum midline. Mucosa normal. No drainage or sinus tenderness. Throat:lips, mucosa, and tongue normal; Neck:Trach midline. No bleeding. Neurologic:Grossly normal Abdomen: G-tube in place,no erythema.  Recent Labs    10/13/18 0901 10/14/18 0252  WBC 10.3 11.7*  HGB 10.1* 10.8*  HCT 31.2* 32.8*  PLT 246 247   Recent Labs    10/12/18 0226 10/13/18 1126  NA 129* 132*  K 4.5 4.7  CL 96* 100  CO2 24 21*  GLUCOSE 144* 174*  BUN 27* 54*  CREATININE 1.19 3.63*  CALCIUM 8.1* 8.0*    Medications:  I have reviewed the patient's current medications. Scheduled: . chlorhexidine  15 mL Mouth Rinse BID  . doxazosin  1 mg Per Tube Daily  . feeding supplement (OSMOLITE 1.5 CAL)  237 mL Per Tube 6 X Daily  . feeding supplement (PRO-STAT SUGAR FREE 64)  30 mL Per Tube BID  . hydrocortisone   Rectal QID  . ipratropium-albuterol  3 mL Nebulization Q6H  . lidocaine  1 application Urethral Once  . mouth rinse  15 mL Mouth Rinse q12n4p  . metoprolol tartrate  25 mg Per Tube QID  . simethicone  160 mg Per Tube QID   Continuous: . sodium chloride 75 mL/hr at 10/13/18 1916  . ceFEPime (MAXIPIME) IV 2 g (10/14/18 0910)  . lactated ringers 10 mL/hr at 10/07/18 0859    Assessment/Plan: Laryngeal SCCA, s/p trachand G-tube placement -Pseudomonas bacteremia.  On cefepime. ID is following. Will need to have port removed. - Diarrhea.Improving. - Recurrent SVT. Cardiology is following. - On Doxazosin for presumed BPH and voiding difficulty. -Neck CT shows extensive laryngeal/pharyngeal/esophagealSCCA. -Now has anuncuffed#6Shiley. - Pt was transferred to the hospitalist service yesterday for continuing medical management. - Pt is scheduled to undergo PET scan at [facility] once he is discharged. He will likely need chemoXRT as soon as possible. Oncology is following.    LOS: 17 days   Vanessa Kampf W Ivis Nicolson 10/14/2018, 10:57 AM

## 2018-10-14 NOTE — Progress Notes (Addendum)
Foley F16  Inserted with the assist. Of [facility]. Patient tol. well and went in with no difficulties. 1100 ml of clear yellow urine was obtain. Foley ordered by Erby PianP.

## 2018-10-14 NOTE — Care Management Important Message (Signed)
Important Message  Patient Details  Name: ADVAIT BUICE MRN: [id number] Date of Birth: 07-25-1945   Medicare Important Message Given:  Yes     Luiscarlos, Kaczmarczyk 10/14/2018, 1:49 PM

## 2018-10-14 NOTE — Telephone Encounter (Signed)
Oncology Nurse Navigator Documentation  Per Dr. Eppie Gibson, Radiation Oncologist, spoke with Hospitalist Dr. Earnest Conroy re transferring patient to [facility]. Explained:  Because of his extended admission and urgency to proceed with tmt, plan to obtain staging PET no longer feasible.    Based on evaluation by Dr. Maylon Peppers, Medical Oncology, he is not a candidate for systemic tmt.  Per Dr. Pearlie Oyster discussion with Dr. Maylon Peppers, plan is for him to proceed with RT ASAP.    To facilitate CT SIM scheduled Wed 7/22 1:00 and start of RT following week, requested patient be transferred to [facility] tomorrow, 7/22. Dr. Earnest Conroy voiced understanding, will place note re transfer for Hospitalist taking over pt's care tomorrow. I spoke with pt's RN Sonia Baller re plan.  Gayleen Orem, RN, BSN Head & Neck Oncology Nurse [place] [facility] [place] [phone]   .

## 2018-10-14 NOTE — Progress Notes (Signed)
PT Cancellation Note  Patient Details Name: Logan Price MRN: [id number] DOB: 06-Nov-1945   Cancelled Treatment:    Reason Eval/Treat Not Completed: Patient declined, no reason specified. Pt declining, stating he has a procedure today and it's too much for 1 day. When questioned if he wished to continue receiving PT he stated he did and he would try to get up tomorrow. Will check back as time allows.  Benjiman Core, PTA Pager [phone] Acute Rehab   Allena Katz 10/14/2018, 10:38 AM

## 2018-10-14 NOTE — Progress Notes (Signed)
Nutrition Follow-up  DOCUMENTATION CODES:   Severe malnutrition in context of chronic illness  INTERVENTION:   Continue bolus feeding:  -Osmolite 1.5 x six cans daily -30 ml Prostat or other protein modular BID -Free water flushes 300 ml four times daily   TF provides: 2330 kcal, 120 grams protein, and 1086 ml free water (2286 ml with flushes).  NUTRITION DIAGNOSIS:   Severe Malnutrition related to chronic illness, cancer and cancer related treatments as evidenced by percent weight loss, severe muscle depletion, moderate muscle depletion, mild fat depletion.  Ongoing  GOAL:   Patient will meet greater than or equal to 90% of their needs  Met via TF  MONITOR:   Labs, I & O's, Skin, TF tolerance, Weight trends  REASON FOR ASSESSMENT:   NPO/Clear Liquid Diet    ASSESSMENT:   Patient with PMH significant for diverticulitis, gastritis, HLD, HTN, laryngeal cancer, and esophogeal dysphagia. 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   7/3- tracheostomy, laryngoscopy and biopsy 7/10-IR unable to place G-tube due to anatomy; colon anterior to stomach 7/11- NGT placed- continuous feedings started 7/13- G-tube placed 7/16- transitioned to bolus feedings   RD working remotely.  Pt now with pseudomonas bacteremia- plan for port removal in IR this am. Hyponatremia continues. Recommend addition of free water flushes (recs above).  Spoke with RN. Diarrhea has slowed. He received a feeding this morning without complication. No nausea/vomiting reported. Will continue with current regimen and monitor closely.   Admission weight: 63 kg Current weight 70.9 kg   Plan for d/c to SNF?  Drips: abx Medications: simethicone QID Labs: Na 132 (L) CBG 116-151 Cr 3.63- trending up   Diet Order:   Diet Order            Diet NPO time specified Except for: Sips with Meds  Diet effective midnight        Diet general               EDUCATION NEEDS:   Not appropriate for education at this time  Skin:  Skin Assessment: Skin Integrity Issues: Skin Integrity Issues:: Incisions, Other (Comment) Incisions: neck/abdomen Other: new wound- sacrum  Last BM:  7/20  Height:   Ht Readings from Last 1 Encounters:  09/27/18 5' 9"  (1.753 m)    Weight:   Wt Readings from Last 1 Encounters:  10/14/18 70.9 kg    Ideal Body Weight:  72.7 kg  BMI:  Body mass index is 23.08 kg/m.  Estimated Nutritional Needs:   Kcal:  2300-2500 kcal  Protein:  115-130 grams  Fluid:  >/= 2.3 L/day   Mariana Single RD, LDN Clinical Nutrition Pager # - [phone]

## 2018-10-15 ENCOUNTER — Telehealth: Payer: Self-pay | Admitting: *Deleted

## 2018-10-15 DIAGNOSIS — Z960 Presence of urogenital implants: Secondary | ICD-10-CM

## 2018-10-15 DIAGNOSIS — R1312 Dysphagia, oropharyngeal phase: Secondary | ICD-10-CM

## 2018-10-15 DIAGNOSIS — C320 Malignant neoplasm of glottis: Secondary | ICD-10-CM

## 2018-10-15 DIAGNOSIS — D72829 Elevated white blood cell count, unspecified: Secondary | ICD-10-CM

## 2018-10-15 DIAGNOSIS — R339 Retention of urine, unspecified: Secondary | ICD-10-CM

## 2018-10-15 LAB — CBC
HCT: 30.9 % — ABNORMAL LOW (ref 39.0–52.0)
Hemoglobin: 10.3 g/dL — ABNORMAL LOW (ref 13.0–17.0)
MCH: 28.2 pg (ref 26.0–34.0)
MCHC: 33.3 g/dL (ref 30.0–36.0)
MCV: 84.7 fL (ref 80.0–100.0)
Platelets: 240 10*3/uL (ref 150–400)
RBC: 3.65 MIL/uL — ABNORMAL LOW (ref 4.22–5.81)
RDW: 13.9 % (ref 11.5–15.5)
WBC: 10 10*3/uL (ref 4.0–10.5)
nRBC: 0 % (ref 0.0–0.2)

## 2018-10-15 LAB — GLUCOSE, CAPILLARY
Glucose-Capillary: 103 mg/dL — ABNORMAL HIGH (ref 70–99)
Glucose-Capillary: 113 mg/dL — ABNORMAL HIGH (ref 70–99)
Glucose-Capillary: 131 mg/dL — ABNORMAL HIGH (ref 70–99)
Glucose-Capillary: 141 mg/dL — ABNORMAL HIGH (ref 70–99)
Glucose-Capillary: 150 mg/dL — ABNORMAL HIGH (ref 70–99)
Glucose-Capillary: 95 mg/dL (ref 70–99)

## 2018-10-15 LAB — BASIC METABOLIC PANEL
Anion gap: 6 (ref 5–15)
BUN: 40 mg/dL — ABNORMAL HIGH (ref 8–23)
CO2: 24 mmol/L (ref 22–32)
Calcium: 8.4 mg/dL — ABNORMAL LOW (ref 8.9–10.3)
Chloride: 105 mmol/L (ref 98–111)
Creatinine, Ser: 1.7 mg/dL — ABNORMAL HIGH (ref 0.61–1.24)
GFR calc Af Amer: 46 mL/min — ABNORMAL LOW (ref >60)
GFR calc non Af Amer: 40 mL/min — ABNORMAL LOW (ref >60)
Glucose, Bld: 161 mg/dL — ABNORMAL HIGH (ref 70–99)
Potassium: 4.9 mmol/L (ref 3.5–5.1)
Sodium: 135 mmol/L (ref 135–145)

## 2018-10-15 LAB — CULTURE, RESPIRATORY W GRAM STAIN
Culture: NORMAL
Gram Stain: NONE SEEN

## 2018-10-15 LAB — MRSA PCR SCREENING: MRSA by PCR: NEGATIVE

## 2018-10-15 LAB — MAGNESIUM: Magnesium: 1.8 mg/dL (ref 1.7–2.4)

## 2018-10-15 MED ORDER — SENNOSIDES-DOCUSATE SODIUM 8.6-50 MG PO TABS
2.0000 | ORAL_TABLET | Freq: Every evening | ORAL | Status: DC | PRN
  Administered 2018-10-23: 2 via ORAL
  Filled 2018-10-15: qty 2

## 2018-10-15 MED ORDER — CHLORHEXIDINE GLUCONATE CLOTH 2 % EX PADS
6.0000 | MEDICATED_PAD | Freq: Every day | CUTANEOUS | Status: DC
  Administered 2018-10-16 – 2018-10-17 (×2): 6 via TOPICAL

## 2018-10-15 MED ORDER — ALUM & MAG HYDROXIDE-SIMETH 200-200-20 MG/5ML PO SUSP
30.0000 mL | ORAL | Status: DC | PRN
  Filled 2018-10-15: qty 30

## 2018-10-15 MED ORDER — IPRATROPIUM-ALBUTEROL 0.5-2.5 (3) MG/3ML IN SOLN
3.0000 mL | Freq: Two times a day (BID) | RESPIRATORY_TRACT | Status: DC
  Administered 2018-10-15: 3 mL via RESPIRATORY_TRACT
  Filled 2018-10-15: qty 3

## 2018-10-15 MED ORDER — SODIUM CHLORIDE 0.9 % IV SOLN
2.0000 g | Freq: Two times a day (BID) | INTRAVENOUS | Status: DC
  Administered 2018-10-15 – 2018-10-18 (×7): 2 g via INTRAVENOUS
  Filled 2018-10-15 (×9): qty 2

## 2018-10-15 MED ORDER — LIP MEDEX EX OINT: 1.0000 "application " | TOPICAL_OINTMENT | CUTANEOUS | Status: DC | PRN

## 2018-10-15 MED ORDER — HYDRALAZINE HCL 20 MG/ML IJ SOLN: 10.0000 mg | INTRAMUSCULAR | Status: DC | PRN

## 2018-10-15 MED ORDER — MUSCLE RUB 10-15 % EX CREA
1.0000 "application " | TOPICAL_CREAM | CUTANEOUS | Status: DC | PRN
  Filled 2018-10-15: qty 85

## 2018-10-15 MED ORDER — HYDROCORTISONE 1 % EX CREA: 1.0000 "application " | TOPICAL_CREAM | Freq: Three times a day (TID) | CUTANEOUS | Status: DC | PRN

## 2018-10-15 MED ORDER — POLYETHYLENE GLYCOL 3350 17 G PO PACK: 17.0000 g | PACK | Freq: Every day | ORAL | Status: DC | PRN

## 2018-10-15 MED ORDER — LORATADINE 10 MG PO TABS: 10.0000 mg | ORAL_TABLET | Freq: Every day | ORAL | Status: DC | PRN

## 2018-10-15 MED ORDER — POLYVINYL ALCOHOL 1.4 % OP SOLN: 1.0000 [drp] | OPHTHALMIC | Status: DC | PRN

## 2018-10-15 MED ORDER — PHENOL 1.4 % MT LIQD
1.0000 | OROMUCOSAL | Status: DC | PRN
  Administered 2018-10-18: 1 via OROMUCOSAL
  Filled 2018-10-15: qty 177

## 2018-10-15 MED ORDER — HYDROCORTISONE (PERIANAL) 2.5 % EX CREA
1.0000 "application " | TOPICAL_CREAM | Freq: Four times a day (QID) | CUTANEOUS | Status: DC | PRN
  Administered 2018-10-28 – 2018-11-05 (×2): 1 via TOPICAL

## 2018-10-15 MED ORDER — SALINE SPRAY 0.65 % NA SOLN: 1.0000 | NASAL | Status: DC | PRN

## 2018-10-15 NOTE — Progress Notes (Signed)
Subjective: Resting comfortably in bed. To be transferred to [facility] for radiation treatment.  Objective: Vital signs in last 24 hours: Temp:  [98 F (36.7 C)-99.4 F (37.4 C)] 98 F (36.7 C) (07/21 0752) Pulse Rate:  [75-96] 75 (07/21 0900) Resp:  [14-30] 20 (07/21 0900) BP: (118-155)/(64-83) 135/75 (07/21 0752) SpO2:  [95 %-100 %] 96 % (07/21 0900) FiO2 (%):  [28 %] 28 % (07/21 0755) Weight:  [69.2 kg] 69.2 kg (07/21 0350)  General appearance:NAD. Head:Normocephalic, without obvious abnormality, atraumatic Eyes:conjunctivae/corneas clear. PERRL, EOM's intact.  Ears:normal TM's and external ear canals both ears Nose:Nares normal. Septum midline. Mucosa normal. No drainage or sinus tenderness. Throat:lips, mucosa, and tongue normal; Neck:Trach midline. No bleeding. Neurologic:Grossly normal Abdomen: G-tube in place.  Recent Labs    10/14/18 0252 10/15/18 0743  WBC 11.7* 10.0  HGB 10.8* 10.3*  HCT 32.8* 30.9*  PLT 247 240   Recent Labs    10/14/18 1847 10/15/18 0743  NA 134* 135  K 5.1 4.9  CL 101 105  CO2 21* 24  GLUCOSE 155* 161*  BUN 81* 40*  CREATININE 5.31* 1.70*  CALCIUM 8.2* 8.4*    Medications:  I have reviewed the patient's current medications. Scheduled: . chlorhexidine  15 mL Mouth Rinse BID  . doxazosin  1 mg Per Tube Daily  . feeding supplement (OSMOLITE 1.5 CAL)  237 mL Per Tube 6 X Daily  . feeding supplement (PRO-STAT SUGAR FREE 64)  30 mL Per Tube BID  . free water  200 mL Per Tube Q8H  . heparin injection (subcutaneous)  5,000 Units Subcutaneous Q8H  . hydrocortisone   Rectal QID  . ipratropium-albuterol  3 mL Nebulization Q6H  . lidocaine  1 application Urethral Once  . mouth rinse  15 mL Mouth Rinse q12n4p  . metoprolol tartrate  25 mg Per Tube QID  . simethicone  160 mg Per Tube QID   Continuous: . sodium chloride 125 mL/hr at 10/15/18 0548  . ceFEPime (MAXIPIME) IV 2 g (10/14/18 0910)  . lactated ringers 10 mL/hr  at 10/07/18 0859  . sodium chloride 500 mL/hr at 10/14/18 2049    Assessment/Plan: Laryngeal SCCA, s/p trachand G-tube placement - To be transferred to [facility] for radiation treatment. -Pseudomonas bacteremia. On cefepime. ID is following.  - Diarrhea.Improving. - Recurrent SVT. Cardiology is following. - On Doxazosin for presumed BPH and voiding difficulty. -Neck CT shows extensive laryngeal/pharyngeal/esophagealSCCA. -Now has anuncuffed#[id number].    LOS: 18 days   Logan [name] W Logan [name] 10/15/2018, 10:03 AM

## 2018-10-15 NOTE — Progress Notes (Signed)
Patient's wife updated on patient's transfer to [facility].

## 2018-10-15 NOTE — Progress Notes (Signed)
Patient ID: Logan [name], male   DOB: 12-May-1945, 73 y.o.   MRN: [id number]   PAC removed yesterday iodoform gauze packing placed  Per Dr Kathlene Cote order--- change to wet/dry dressing today  Using sterile procedure Iodoform packing removed New saline sterile gauze packed into pocket  Dry dressing over packing- opsite placed  No bleeding No sign of infection noted Dressing placed without complication  Will follow and redress daily

## 2018-10-15 NOTE — Progress Notes (Signed)
PHARMACY NOTE:  ANTIMICROBIAL RENAL DOSAGE ADJUSTMENT  Current antimicrobial regimen includes a mismatch between antimicrobial dosage and estimated renal function.  As per policy approved by the Pharmacy & Therapeutics and Medical Executive Committees, the antimicrobial dosage will be adjusted accordingly.  Current antimicrobial dosage: cefepime 2g IV q24h   Indication: pseudomonas bacteremia  Renal Function:  Estimated Creatinine Clearance: 39 mL/min (A) (by C-G formula based on SCr of 1.7 mg/dL (H)).     Antimicrobial dosage has been changed to:  Cefepime 2g IV q12h  Thank you for allowing pharmacy to be a part of this patient's care.  Candie Mile, [facility] 10/15/2018 11:36 AM

## 2018-10-15 NOTE — Progress Notes (Signed)
Logan NOTE    STYLEZ FUNDORA  YTK:[id number] DOB: 26-Mar-1946 DOA: 09/27/2018 PCP: [name], Logan   Brief Narrative:  73 year old with history of [name], Logan [name], Logan [name] which converted back to normal sinus rhythm.  G-tube placed by surgery 7/13, tolerating tube feeds.  Metoprolol was started via PEG tube.  Hospitals course was later on complicated by Pseudomonas bacteremia secondary to infected Port-A-Cath.  Patient was transferred to medicine service.  Infectious disease team were consulted who recommended removing Port-A-Cath.   Assessment & Plan:   Principal Problem:   Squamous cell carcinoma of glottis (HCC) Active Problems:   Laryngeal mass   Goals of care, counseling/discussion   Dysphagia   Loss of weight   PSVT (paroxysmal supraventricular tachycardia) (HCC)   Protein-calorie malnutrition, severe   Bacteremia   Leukocytosis   Normocytic anemia  Pseudomonas bacteremia -Likely source was Port-A-Cath which was removed on 10/14/2018.  At this time planning on continuing IV cefepime for total of 2 weeks per infectious disease.  If the cultures are negative, PICC line can be placed in case if he gets transition to skilled nursing facility in the meantime.  Acute kidney injury secondary to acute urinary retention -Patient had over 1 L of urine in his bladder.  Creatinine as high as 5.3, after placing Foley catheter 10/14/2018 his creatinine has trended down to 1.7.  Continue to monitor urine output.  Over next couple of days, we can attempt for voiding trial.   Paroxysmal supraventricular tachycardia; intermittent and recurrent -Unknown exact etiology.  So narrow complex tachycardia with regular rate.  Received 1 dose of adenosine, now in normal sinus rhythm with  heart rate in 90s. -TSH- Normal. Echo normal.  -Replete Lytes. Keep K >4.0, Mg >2. Mg repletion ordered.  -Continue metoprolol via G-tube. -Appreciate cardiology input.  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  Status post G-tube placement 7/13.  Start feeding once cleared by surgery. -Robinul seems to be helping with secretions. -Nutrition team is following. -Seen by oncology team.  Patient will be transferred to [facility] for radiation oncology evaluation on 10/16/2018.  I have discussed this with their nursing coordinator, Raiford Noble who is aware of the patient.  DVT prophylaxis-subcutaneous heparin Disposition-maintain hospital stay for further inpatient treatment.  Patient will be transferred to [facility] hospital. CODE STATUS-full. Family communication- spoke with his family member via face time this morning.  Subjective: Patient does not have any complaints.  He is eager to get his malignancy treated.  Review of Systems Otherwise negative except as per HPI, including: General = no fevers, chills, dizziness, malaise, fatigue HEENT/EYES = negative for pain, redness, loss of vision, double vision, blurred vision, loss of hearing, sore throat, hoarseness, dysphagia Cardiovascular= negative for chest pain, palpitation, murmurs, lower extremity swelling Respiratory/lungs= negative for shortness of breath, cough, hemoptysis, wheezing, mucus production Gastrointestinal= negative for nausea, vomiting,, abdominal pain, melena, hematemesis Genitourinary= negative for Dysuria, Hematuria, Change in Urinary Frequency MSK = Negative for arthralgia, myalgias, Back Pain, Joint swelling  Neurology= Negative for headache, seizures, numbness, tingling  Psychiatry= Negative for anxiety, depression, suicidal and homocidal ideation Allergy/Immunology= Medication/Food allergy as listed  Skin= Negative for Rash,  lesions, ulcers, itching    Objective: Vitals:   10/15/18 0755 10/15/18  0900 10/15/18 1005 10/15/18 1132  BP:   (!) 153/76 (!) 147/66  Pulse: 96 75  94  Resp: 18 20  (!) 21  Temp:      TempSrc:      SpO2:  96%  96%  Weight:      Height:        Intake/Output Summary (Last 24 hours) at 10/15/2018 1243 Last data filed at 10/15/2018 1142 Gross per 24 hour  Intake 4051.25 ml  Output 6150 ml  Net -2098.75 ml   Filed Weights   10/13/18 0412 10/14/18 0426 10/15/18 0350  Weight: 69.6 kg 70.9 kg 69.2 kg    Examination: Constitutional: NAD, calm, comfortable Eyes: PERRL, lids and conjunctivae normal ENMT: Mucous membranes are moist. Posterior pharynx clear of any exudate or lesions.Normal dentition.  Tracheostomy noted with some secretions. Neck: normal, supple, no masses, no thyromegaly Respiratory: Mild anterior coarse breath sounds Cardiovascular: Regular rate and rhythm, no murmurs / rubs / gallops. No extremity edema. 2+ pedal pulses. No carotid bruits.  Abdomen: no tenderness, no masses palpated. No hepatosplenomegaly. Bowel sounds positive.  Musculoskeletal: no clubbing / cyanosis. No joint deformity upper and lower extremities. Good ROM, no contractures. Normal muscle tone.  Skin: no rashes, lesions, ulcers. No induration Neurologic: CN 2-12 grossly intact. Sensation intact, DTR normal. Strength 4/5 in all 4.  Psychiatric: Normal judgment and insight. Alert and oriented x 3. Normal mood.  Foley catheter in place Right chest wall Port-A-Cath site noted.   Coud catheter in place  Data Reviewed:   CBC: Recent Labs  Lab 10/10/18 0603 10/11/18 0416 10/13/18 0901 10/14/18 0252 10/15/18 0743  WBC 21.2* 23.7* 10.3 11.7* 10.0  NEUTROABS 19.1* 20.8*  --   --   --   HGB 11.6* 11.2* 10.1* 10.8* 10.3*  HCT 36.1* 34.3* 31.2* 32.8* 30.9*  MCV 88.7 87.5 87.2 86.1 84.7  PLT 328 305 246 247 240   Basic Metabolic Panel: Recent Labs  Lab 10/09/18 0359 10/10/18 0603  10/11/18 0416 10/12/18 0226 10/13/18 1126 10/14/18 1847 10/15/18 0743 10/15/18 1029  NA 138 132* 129* 129* 132* 134* 135  --   K 4.6 4.1 4.6 4.5 4.7 5.1 4.9  --   CL 103 94* 94* 96* 100 101 105  --   CO2 26 26 24 24  21* 21* 24  --   GLUCOSE 114* 161* 150* 144* 174* 155* 161*  --   BUN 10 14 18  27* 54* 81* 40*  --   CREATININE 0.67 0.67 0.94 1.19 3.63* 5.31* 1.70*  --   CALCIUM 8.8* 8.9 8.7* 8.1* 8.0* 8.2* 8.4*  --   MG 1.8 1.5* 2.6* 2.0 2.1  --   --  1.8  PHOS 4.1 3.5 5.2* 4.0 4.2  --   --   --    GFR: Estimated Creatinine Clearance: 39 mL/min (A) (by C-G formula based on SCr of 1.7 mg/dL (H)). Liver Function Tests: No results for input(s): AST, ALT, ALKPHOS, BILITOT, PROT, ALBUMIN in the last 168 hours. No results for input(s): LIPASE, AMYLASE in the last 168 hours. No results for input(s): AMMONIA in the last 168 hours. Coagulation Profile: Recent Labs  Lab 10/14/18 0252  INR 1.3*   Cardiac Enzymes: No results for input(s): CKTOTAL, CKMB, CKMBINDEX, TROPONINI in the last 168 hours. BNP (last 3 results) No results for input(s): PROBNP in the last 8760 hours. HbA1C: No results for input(s): HGBA1C in the last 72 hours. CBG: Recent Labs  Lab 10/14/18 2106 10/14/18 2347  10/15/18 0353 10/15/18 0748 10/15/18 1139  GLUCAP 142* 138* 103* 141* 150*   Lipid Profile: No results for input(s): CHOL, HDL, LDLCALC, TRIG, CHOLHDL, LDLDIRECT in the last 72 hours. Thyroid Function Tests: No results for input(s): TSH, T4TOTAL, FREET4, T3FREE, THYROIDAB in the last 72 hours. Anemia Panel: No results for input(s): VITAMINB12, FOLATE, FERRITIN, TIBC, IRON, RETICCTPCT in the last 72 hours. Sepsis Labs: Recent Labs  Lab 10/11/18 0648  PROCALCITON <0.10  LATICACIDVEN 1.5    Recent Results (from the past 240 hour(s))  Gastrointestinal Panel by PCR , Stool     Status: None   Collection Time: 10/10/18  1:33 AM   Specimen: Stool  Result Value Ref Range Status   Campylobacter  species NOT DETECTED NOT DETECTED Final   Plesimonas shigelloides NOT DETECTED NOT DETECTED Final   Salmonella species NOT DETECTED NOT DETECTED Final   Yersinia enterocolitica NOT DETECTED NOT DETECTED Final   Vibrio species NOT DETECTED NOT DETECTED Final   Vibrio cholerae NOT DETECTED NOT DETECTED Final   Enteroaggregative E coli (EAEC) NOT DETECTED NOT DETECTED Final   Enteropathogenic E coli (EPEC) NOT DETECTED NOT DETECTED Final   Enterotoxigenic E coli (ETEC) NOT DETECTED NOT DETECTED Final   Shiga like toxin producing E coli (STEC) NOT DETECTED NOT DETECTED Final   Shigella/Enteroinvasive E coli (EIEC) NOT DETECTED NOT DETECTED Final   Cryptosporidium NOT DETECTED NOT DETECTED Final   Cyclospora cayetanensis NOT DETECTED NOT DETECTED Final   Entamoeba histolytica NOT DETECTED NOT DETECTED Final   Giardia lamblia NOT DETECTED NOT DETECTED Final   Adenovirus F40/41 NOT DETECTED NOT DETECTED Final   Astrovirus NOT DETECTED NOT DETECTED Final   Norovirus GI/GII NOT DETECTED NOT DETECTED Final   Rotavirus A NOT DETECTED NOT DETECTED Final   Sapovirus (I, II, IV, and V) NOT DETECTED NOT DETECTED Final    Comment: Performed at [facility], [address] [place], [place] [address]  Culture, blood (routine x 2)     Status: Abnormal   Collection Time: 10/11/18  6:47 AM   Specimen: BLOOD LEFT HAND  Result Value Ref Range Status   Specimen Description BLOOD LEFT HAND  Final   Special Requests   Final    BOTTLES DRAWN AEROBIC ONLY Blood Culture results may not be optimal due to an inadequate volume of blood received in culture bottles   Culture  Setup Time   Final    AEROBIC BOTTLE ONLY GRAM NEGATIVE RODS CRITICAL RESULT CALLED TO, READ BACK BY AND VERIFIED WITH: Melven Sartorius [facility] 10/12/18 0425 JDW Performed at [facility], [address] [place], [place] [address]    Culture PSEUDOMONAS AERUGINOSA (A)  Final   Report Status 10/14/2018 FINAL  Final   Organism ID,  Bacteria PSEUDOMONAS AERUGINOSA  Final      Susceptibility   Pseudomonas aeruginosa - MIC*    CEFTAZIDIME 4 SENSITIVE Sensitive     CIPROFLOXACIN <=0.25 SENSITIVE Sensitive     GENTAMICIN <=1 SENSITIVE Sensitive     IMIPENEM <=0.25 SENSITIVE Sensitive     PIP/TAZO 8 SENSITIVE Sensitive     CEFEPIME 2 SENSITIVE Sensitive     * PSEUDOMONAS AERUGINOSA  Blood Culture ID Panel (Reflexed)     Status: Abnormal   Collection Time: 10/11/18  6:47 AM  Result Value Ref Range Status   Enterococcus species NOT DETECTED NOT DETECTED Final   Listeria monocytogenes NOT DETECTED NOT DETECTED Final   Staphylococcus species NOT DETECTED NOT DETECTED Final  Staphylococcus aureus (BCID) NOT DETECTED NOT DETECTED Final   Streptococcus species NOT DETECTED NOT DETECTED Final   Streptococcus agalactiae NOT DETECTED NOT DETECTED Final   Streptococcus pneumoniae NOT DETECTED NOT DETECTED Final   Streptococcus pyogenes NOT DETECTED NOT DETECTED Final   Acinetobacter baumannii NOT DETECTED NOT DETECTED Final   Enterobacteriaceae species NOT DETECTED NOT DETECTED Final   Enterobacter cloacae complex NOT DETECTED NOT DETECTED Final   Escherichia coli NOT DETECTED NOT DETECTED Final   Klebsiella oxytoca NOT DETECTED NOT DETECTED Final   Klebsiella pneumoniae NOT DETECTED NOT DETECTED Final   Proteus species NOT DETECTED NOT DETECTED Final   Serratia marcescens NOT DETECTED NOT DETECTED Final   Carbapenem resistance NOT DETECTED NOT DETECTED Final   Haemophilus influenzae NOT DETECTED NOT DETECTED Final   Neisseria meningitidis NOT DETECTED NOT DETECTED Final   Pseudomonas aeruginosa DETECTED (A) NOT DETECTED Final    Comment: CRITICAL RESULT CALLED TO, READ BACK BY AND VERIFIED WITH: J LEDFORD PHARMD 10/12/18 0425 JDW    Candida albicans NOT DETECTED NOT DETECTED Final   Candida glabrata NOT DETECTED NOT DETECTED Final   Candida krusei NOT DETECTED NOT DETECTED Final   Candida parapsilosis NOT DETECTED NOT  DETECTED Final   Candida tropicalis NOT DETECTED NOT DETECTED Final    Comment: Performed at [facility], [address] [place], [place] [address]  Culture, blood (routine x 2)     Status: Abnormal   Collection Time: 10/11/18  7:45 AM   Specimen: BLOOD  Result Value Ref Range Status   Specimen Description BLOOD RIGHT ANTECUBITAL  Final   Special Requests   Final    BOTTLES DRAWN AEROBIC ONLY Blood Culture adequate volume   Culture  Setup Time   Final    AEROBIC BOTTLE ONLY GRAM NEGATIVE RODS CRITICAL VALUE NOTED.  VALUE IS CONSISTENT WITH PREVIOUSLY REPORTED AND CALLED VALUE.    Culture (A)  Final    PSEUDOMONAS AERUGINOSA SUSCEPTIBILITIES PERFORMED ON PREVIOUS CULTURE WITHIN THE LAST 5 DAYS. Performed at [facility], [address] [place], [place] [address]    Report Status 10/14/2018 FINAL  Final  Culture, respiratory (non-expectorated)     Status: None   Collection Time: 10/13/18  4:26 AM   Specimen: Tracheal Aspirate; Respiratory  Result Value Ref Range Status   Specimen Description TRACHEAL ASPIRATE  Final   Special Requests NONE  Final   Gram Stain NO WBC SEEN NO ORGANISMS SEEN   Final   Culture   Final    Consistent with normal respiratory flora. Performed at [facility], [address], [place], [place] [address]    Report Status 10/15/2018 FINAL  Final         Radiology Studies: Ir Removal Tun Access W/ [place] W/o [place] Mod Sed  Result Date: 10/14/2018 CLINICAL DATA:  History of head and neck squamous cell carcinoma. Status post right upper chest subcutaneous Port-A-Cath placement on 10/04/2018 with catheter access via the right internal jugular vein. Development Pseudomonas bacteremia now requiring Port-A-Cath removal due to suspected catheter seeding/source for infection. EXAM: REMOVAL OF IMPLANTED TUNNELED PORT-A-CATH MEDICATIONS: None PROCEDURE: The right chest Port-A-Cath site was prepped with chlorhexidine. A sterile gown and gloves were  worn during the procedure. Local anesthesia was provided with 1% lidocaine. An incision was made overlying the Port-A-Cath with a #15 scalpel. Utilizing sharp and blunt dissection, the Port-A-Cath was removed. The pocked was debrided with gauze soaked in sterile saline. Iodoform gauze was  then packed into the wound. A dressing was then applied over the iodoform gauze. FINDINGS: After making an incision, the port pocket was inspected and demonstrates bloody fluid which is mildly turbulent and felt to be likely infected. Tissue was also indurated and therefore the pocket was left open to heal secondarily. After debridement, iodoform gauze was packed into the port wound. The entire Port-A-Cath and attached catheter were successfully removed. IMPRESSION: Removal of implanted Port-A-Cath utilizing sharp and blunt dissection. The port pocket demonstrates what appears to be likely infected hematoma. After debridement of bloody fluid and clot, the wound was packed with iodoform gauze. Electronically Signed   By: Irish Lack M.D.   On: 10/14/2018 14:34        Scheduled Meds: . chlorhexidine  15 mL Mouth Rinse BID  . doxazosin  1 mg Per Tube Daily  . feeding supplement (OSMOLITE 1.5 CAL)  237 mL Per Tube 6 X Daily  . feeding supplement (PRO-STAT SUGAR FREE 64)  30 mL Per Tube BID  . free water  200 mL Per Tube Q8H  . heparin injection (subcutaneous)  5,000 Units Subcutaneous Q8H  . hydrocortisone   Rectal QID  . ipratropium-albuterol  3 mL Nebulization Q6H  . lidocaine  1 application Urethral Once  . mouth rinse  15 mL Mouth Rinse q12n4p  . metoprolol tartrate  25 mg Per Tube QID  . simethicone  160 mg Per Tube QID   Continuous Infusions: . sodium chloride 125 mL/hr at 10/15/18 0548  . ceFEPime (MAXIPIME) IV 2 g (10/15/18 1234)  . lactated ringers 10 mL/hr at 10/07/18 0859  . sodium chloride 500 mL/hr at 10/14/18 2049     LOS: 18 days   Time spent= 25 mins    Ankit Joline Maxcy, MD [facility]  If 7PM-7AM, please contact night-coverage [email or web address] 10/15/2018, 12:43 PM

## 2018-10-15 NOTE — Progress Notes (Signed)
[facility]ious [facility]  Date of Admission:  09/27/2018     Total days of antibiotics 6         ASSESSMENT/PLAN  Mr. 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 S/p port removal on 7/20. Also experienced urinary retention requiring Foley catheter insertion.  Pseudomonas aeruginosa bactermia - Repeat blood cultures drawn this morning. Source control likely achieved with removal of Port-a-cath. Hold replacement central line until repeat blood cultures remain negative. Will continue current dose of Cefepime with goal of 14 days of treatment using today (7/21) as Day 1 as long as cultures remain negative with recommended end date of 10/29/18.  Leukocytosis - Appears resolved with most recent WBC coung of 10.   Urinary retention - Creatinine initially increased and now improved down to 1.7 with insertion of urinary catheter. Continue management per primary team.   Squamous cell carcinoma of the glottis - Oncology planning for radiation therapy and transfer to [facility].   ID will continue to monitor cultures and be available as needed for remainder of hospitalizaion. Dr. Prince Rome is available for questions as needed following transfer to [facility].   Principal Problem:   Squamous cell carcinoma of glottis (HCC) Active Problems:   Laryngeal mass   Goals of care, counseling/discussion   Dysphagia   Loss of weight   PSVT (paroxysmal supraventricular tachycardia) (HCC)   Protein-calorie malnutrition, severe   Bacteremia   Leukocytosis   Normocytic anemia   . chlorhexidine  15 mL Mouth Rinse BID  . doxazosin  1 mg Per Tube Daily  . feeding supplement (OSMOLITE 1.5 CAL)  237 mL Per Tube 6 X Daily  . feeding supplement (PRO-STAT SUGAR FREE 64)  30 mL Per Tube BID  . free water  200 mL Per Tube Q8H  .  heparin injection (subcutaneous)  5,000 Units Subcutaneous Q8H  . hydrocortisone   Rectal QID  . ipratropium-albuterol  3 mL Nebulization Q6H  . lidocaine  1 application Urethral Once  . mouth rinse  15 mL Mouth Rinse q12n4p  . metoprolol tartrate  25 mg Per Tube QID  . simethicone  160 mg Per Tube QID    SUBJECTIVE:  Afebrile overnight with normalizing WBC count of 10. Required Foley catheter insertion for urinary retention. Elevated creatinine overnight believed to be related to urinary retention. Most recent this morning of 1.7. Doing okay today. Using the Passey-Muir valve.   No Known Allergies   Review of Systems: Review of Systems  Constitutional: Negative for chills, fever and weight loss.  Respiratory: Negative for cough, shortness of breath and wheezing.   Cardiovascular: Negative for chest pain and leg swelling.  Gastrointestinal: Negative for abdominal pain, constipation, diarrhea, nausea and vomiting.  Skin: Negative for rash.      OBJECTIVE: Vitals:   10/15/18 0700 10/15/18 0752 10/15/18 0755 10/15/18 0900  BP:  135/75    Pulse: 89 89 96 75  Resp: (!) 25 (!) 21 18 20   Temp:  98 F (36.7 C)    TempSrc:  Oral    SpO2: 98% 97%  96%  Weight:      Height:       Body mass index is 22.53 kg/m.  Physical Exam Constitutional:      General: He is not in acute distress.    Appearance: He is well-developed.  Cardiovascular:  Rate and Rhythm: Normal rate and regular rhythm.     Heart sounds: Normal heart sounds.  Pulmonary:     Effort: Pulmonary effort is normal.     Breath sounds: Rhonchi present.  Skin:    General: Skin is warm and dry.  Neurological:     Mental Status: He is alert and oriented to person, place, and time.  Psychiatric:        Mood and Affect: Mood normal.     Lab Results Lab Results  Component Value Date   WBC 10.0 10/15/2018   HGB 10.3 (L) 10/15/2018   HCT 30.9 (L) 10/15/2018   MCV 84.7 10/15/2018   PLT 240 10/15/2018     Lab Results  Component Value Date   CREATININE 1.70 (H) 10/15/2018   BUN 40 (H) 10/15/2018   NA 135 10/15/2018   K 4.9 10/15/2018   CL 105 10/15/2018   CO2 24 10/15/2018    Lab Results  Component Value Date   ALT 16 04/25/2018   AST 21 04/25/2018   ALKPHOS 60 04/25/2018   BILITOT 0.4 04/25/2018     Microbiology: Recent Results (from the past 240 hour(s))  Gastrointestinal Panel by PCR , Stool     Status: None   Collection Time: 10/10/18  1:33 AM   Specimen: Stool  Result Value Ref Range Status   Campylobacter species NOT DETECTED NOT DETECTED Final   Plesimonas shigelloides NOT DETECTED NOT DETECTED Final   Salmonella species NOT DETECTED NOT DETECTED Final   Yersinia enterocolitica NOT DETECTED NOT DETECTED Final   Vibrio species NOT DETECTED NOT DETECTED Final   Vibrio cholerae NOT DETECTED NOT DETECTED Final   Enteroaggregative E coli (EAEC) NOT DETECTED NOT DETECTED Final   Enteropathogenic E coli (EPEC) NOT DETECTED NOT DETECTED Final   Enterotoxigenic E coli (ETEC) NOT DETECTED NOT DETECTED Final   Shiga like toxin producing E coli (STEC) NOT DETECTED NOT DETECTED Final   Shigella/Enteroinvasive E coli (EIEC) NOT DETECTED NOT DETECTED Final   Cryptosporidium NOT DETECTED NOT DETECTED Final   Cyclospora cayetanensis NOT DETECTED NOT DETECTED Final   Entamoeba histolytica NOT DETECTED NOT DETECTED Final   Giardia lamblia NOT DETECTED NOT DETECTED Final   Adenovirus F40/41 NOT DETECTED NOT DETECTED Final   Astrovirus NOT DETECTED NOT DETECTED Final   Norovirus GI/GII NOT DETECTED NOT DETECTED Final   Rotavirus A NOT DETECTED NOT DETECTED Final   Sapovirus (I, II, IV, and V) NOT DETECTED NOT DETECTED Final    Comment: Performed at [facility], [address] [place], [place] [address]  Culture, blood (routine x 2)     Status: Abnormal   Collection Time: 10/11/18  6:47 AM   Specimen: BLOOD LEFT HAND  Result Value Ref Range Status   Specimen Description  BLOOD LEFT HAND  Final   Special Requests   Final    BOTTLES DRAWN AEROBIC ONLY Blood Culture results may not be optimal due to an inadequate volume of blood received in culture bottles   Culture  Setup Time   Final    AEROBIC BOTTLE ONLY GRAM NEGATIVE RODS CRITICAL RESULT CALLED TO, READ BACK BY AND VERIFIED WITH: Karsten [facility] 10/12/18 0425 JDW Performed at [facility], [address] [place], [place] [address]    Culture PSEUDOMONAS AERUGINOSA (A)  Final   Report Status 10/14/2018 FINAL  Final   Organism ID, Bacteria PSEUDOMONAS AERUGINOSA  Final      Susceptibility   Pseudomonas aeruginosa -  MIC*    CEFTAZIDIME 4 SENSITIVE Sensitive     CIPROFLOXACIN <=0.25 SENSITIVE Sensitive     GENTAMICIN <=1 SENSITIVE Sensitive     IMIPENEM <=0.25 SENSITIVE Sensitive     PIP/TAZO 8 SENSITIVE Sensitive     CEFEPIME 2 SENSITIVE Sensitive     * PSEUDOMONAS AERUGINOSA  Blood Culture ID Panel (Reflexed)     Status: Abnormal   Collection Time: 10/11/18  6:47 AM  Result Value Ref Range Status   Enterococcus species NOT DETECTED NOT DETECTED Final   Listeria monocytogenes NOT DETECTED NOT DETECTED Final   Staphylococcus species NOT DETECTED NOT DETECTED Final   Staphylococcus aureus (BCID) NOT DETECTED NOT DETECTED Final   Streptococcus species NOT DETECTED NOT DETECTED Final   Streptococcus agalactiae NOT DETECTED NOT DETECTED Final   Streptococcus pneumoniae NOT DETECTED NOT DETECTED Final   Streptococcus pyogenes NOT DETECTED NOT DETECTED Final   Acinetobacter baumannii NOT DETECTED NOT DETECTED Final   Enterobacteriaceae species NOT DETECTED NOT DETECTED Final   Enterobacter cloacae complex NOT DETECTED NOT DETECTED Final   Escherichia coli NOT DETECTED NOT DETECTED Final   Klebsiella oxytoca NOT DETECTED NOT DETECTED Final   Klebsiella pneumoniae NOT DETECTED NOT DETECTED Final   Proteus species NOT DETECTED NOT DETECTED Final   Serratia marcescens NOT DETECTED NOT DETECTED Final    Carbapenem resistance NOT DETECTED NOT DETECTED Final   Haemophilus influenzae NOT DETECTED NOT DETECTED Final   Neisseria meningitidis NOT DETECTED NOT DETECTED Final   Pseudomonas aeruginosa DETECTED (A) NOT DETECTED Final    Comment: CRITICAL RESULT CALLED TO, READ BACK BY AND VERIFIED WITH: J LEDFORD PHARMD 10/12/18 0425 JDW    Candida albicans NOT DETECTED NOT DETECTED Final   Candida glabrata NOT DETECTED NOT DETECTED Final   Candida krusei NOT DETECTED NOT DETECTED Final   Candida parapsilosis NOT DETECTED NOT DETECTED Final   Candida tropicalis NOT DETECTED NOT DETECTED Final    Comment: Performed at [facility], [address], [place], [place] [address]  Culture, blood (routine x 2)     Status: Abnormal   Collection Time: 10/11/18  7:45 AM   Specimen: BLOOD  Result Value Ref Range Status   Specimen Description BLOOD RIGHT ANTECUBITAL  Final   Special Requests   Final    BOTTLES DRAWN AEROBIC ONLY Blood Culture adequate volume   Culture  Setup Time   Final    AEROBIC BOTTLE ONLY GRAM NEGATIVE RODS CRITICAL VALUE NOTED.  VALUE IS CONSISTENT WITH PREVIOUSLY REPORTED AND CALLED VALUE.    Culture (A)  Final    PSEUDOMONAS AERUGINOSA SUSCEPTIBILITIES PERFORMED ON PREVIOUS CULTURE WITHIN THE LAST 5 DAYS. Performed at [facility], [place] [address] [place], [place] [address]    Report Status 10/14/2018 FINAL  Final  Culture, respiratory (non-expectorated)     Status: None   Collection Time: 10/13/18  4:26 AM   Specimen: Tracheal Aspirate; Respiratory  Result Value Ref Range Status   Specimen Description TRACHEAL ASPIRATE  Final   Special Requests NONE  Final   Gram Stain NO WBC SEEN NO ORGANISMS SEEN   Final   Culture   Final    Consistent with normal respiratory flora. Performed at [facility], [place] [address] [place], [place] [address]    Report Status 10/15/2018 FINAL  Final     Terri Piedra, NP [place] [facility] [phone] Pager  10/15/2018  9:07 AM

## 2018-10-15 NOTE — Progress Notes (Addendum)
Report called to Alroy Dust, RN at [facility] at this time.   1745: Patient left via [facility] at this time

## 2018-10-15 NOTE — Plan of Care (Signed)
Continue to monitor

## 2018-10-15 NOTE — Telephone Encounter (Addendum)
Oncology Nurse Navigator Documentation  In follow-up to conversation with Hospitalist Dr. Reesa Chew 1000 this morning 1000 re patient's transfer to [facility] today, spoke with patient's RN Star to check on status of transfer.  She indicated order placed for transfer tomorrow.  I explained transfer requested for today in order to insure patient's availability for tomorrow morning's [facility] 11:30 Nurse Eval, 11:30 consult with Dr. Eppie Gibson and 1:00 CT SIM. She spoke with CM Christy, [facility] Bed Mgt to be called.  Star agreed to call me with update when available.  Addendum 1653:  Star called to inform she is about to call report to Nicola Girt, Mr. Warehime to be transferred by [facility] shortly.  Gayleen Orem, RN, BSN Head & Neck Oncology Nurse [place] at [place] [phone]

## 2018-10-15 NOTE — Progress Notes (Signed)
[facility] called at this time for patient transport to [facility].

## 2018-10-16 ENCOUNTER — Ambulatory Visit: Payer: Medicare Other

## 2018-10-16 ENCOUNTER — Ambulatory Visit
Admit: 2018-10-16 | Discharge: 2018-10-16 | Disposition: A | Discharge status: Home / Self Care | Payer: Medicare Other | Attending: Radiation Oncology | Admitting: Radiation Oncology

## 2018-10-16 ENCOUNTER — Ambulatory Visit
Admit: 2018-10-16 | Discharge: 2018-10-16 | Disposition: A | Discharge status: Home / Self Care | Payer: Medicare Other | Source: Ambulatory Visit | Attending: Radiation Oncology | Admitting: Radiation Oncology

## 2018-10-16 ENCOUNTER — Encounter: Payer: Self-pay | Admitting: *Deleted

## 2018-10-16 ENCOUNTER — Ambulatory Visit: Payer: Medicare Other | Admitting: Radiation Oncology

## 2018-10-16 DIAGNOSIS — Z79899 Other long term (current) drug therapy: Secondary | ICD-10-CM | POA: Insufficient documentation

## 2018-10-16 DIAGNOSIS — R590 Localized enlarged lymph nodes: Secondary | ICD-10-CM | POA: Insufficient documentation

## 2018-10-16 DIAGNOSIS — J982 Interstitial emphysema: Secondary | ICD-10-CM | POA: Insufficient documentation

## 2018-10-16 DIAGNOSIS — C320 Malignant neoplasm of glottis: Secondary | ICD-10-CM

## 2018-10-16 DIAGNOSIS — I10 Essential (primary) hypertension: Secondary | ICD-10-CM | POA: Diagnosis not present

## 2018-10-16 DIAGNOSIS — I471 Supraventricular tachycardia: Secondary | ICD-10-CM | POA: Insufficient documentation

## 2018-10-16 DIAGNOSIS — K219 Gastro-esophageal reflux disease without esophagitis: Secondary | ICD-10-CM | POA: Insufficient documentation

## 2018-10-16 DIAGNOSIS — J90 Pleural effusion, not elsewhere classified: Secondary | ICD-10-CM | POA: Insufficient documentation

## 2018-10-16 DIAGNOSIS — J439 Emphysema, unspecified: Secondary | ICD-10-CM | POA: Insufficient documentation

## 2018-10-16 DIAGNOSIS — R7881 Bacteremia: Secondary | ICD-10-CM | POA: Diagnosis not present

## 2018-10-16 DIAGNOSIS — Z87891 Personal history of nicotine dependence: Secondary | ICD-10-CM | POA: Insufficient documentation

## 2018-10-16 DIAGNOSIS — R634 Abnormal weight loss: Secondary | ICD-10-CM | POA: Insufficient documentation

## 2018-10-16 DIAGNOSIS — C329 Malignant neoplasm of larynx, unspecified: Secondary | ICD-10-CM

## 2018-10-16 DIAGNOSIS — E785 Hyperlipidemia, unspecified: Secondary | ICD-10-CM | POA: Insufficient documentation

## 2018-10-16 DIAGNOSIS — Z930 Tracheostomy status: Secondary | ICD-10-CM | POA: Diagnosis not present

## 2018-10-16 LAB — CBC
HCT: 32.5 % — ABNORMAL LOW (ref 39.0–52.0)
Hemoglobin: 10.2 g/dL — ABNORMAL LOW (ref 13.0–17.0)
MCH: 28.3 pg (ref 26.0–34.0)
MCHC: 31.4 g/dL (ref 30.0–36.0)
MCV: 90.3 fL (ref 80.0–100.0)
Platelets: 309 10*3/uL (ref 150–400)
RBC: 3.6 MIL/uL — ABNORMAL LOW (ref 4.22–5.81)
RDW: 14.2 % (ref 11.5–15.5)
WBC: 9 10*3/uL (ref 4.0–10.5)
nRBC: 0 % (ref 0.0–0.2)

## 2018-10-16 LAB — BASIC METABOLIC PANEL
Anion gap: 5 (ref 5–15)
BUN: 17 mg/dL (ref 8–23)
CO2: 30 mmol/L (ref 22–32)
Calcium: 8.5 mg/dL — ABNORMAL LOW (ref 8.9–10.3)
Chloride: 104 mmol/L (ref 98–111)
Creatinine, Ser: 0.65 mg/dL (ref 0.61–1.24)
GFR calc Af Amer: >60 mL/min (ref >60)
GFR calc non Af Amer: >60 mL/min (ref >60)
Glucose, Bld: 121 mg/dL — ABNORMAL HIGH (ref 70–99)
Potassium: 4.1 mmol/L (ref 3.5–5.1)
Sodium: 139 mmol/L (ref 135–145)

## 2018-10-16 LAB — GLUCOSE, CAPILLARY
Glucose-Capillary: 104 mg/dL — ABNORMAL HIGH (ref 70–99)
Glucose-Capillary: 125 mg/dL — ABNORMAL HIGH (ref 70–99)
Glucose-Capillary: 119 mg/dL — ABNORMAL HIGH (ref 70–99)
Glucose-Capillary: 124 mg/dL — ABNORMAL HIGH (ref 70–99)

## 2018-10-16 LAB — MAGNESIUM: Magnesium: 1.7 mg/dL (ref 1.7–2.4)

## 2018-10-16 MED ORDER — AMLODIPINE BESYLATE 5 MG PO TABS
5.0000 mg | ORAL_TABLET | Freq: Every day | ORAL | Status: DC
  Administered 2018-10-16 – 2018-10-27 (×12): 5 mg
  Filled 2018-10-16 (×13): qty 1

## 2018-10-16 MED ORDER — AMLODIPINE 1 MG/ML ORAL SUSPENSION: 5.0000 mg | Freq: Every day | ORAL | Status: DC

## 2018-10-16 MED ORDER — SIMVASTATIN 20 MG PO TABS
20.0000 mg | ORAL_TABLET | Freq: Every day | ORAL | Status: DC
  Administered 2018-10-16 – 2018-11-07 (×23): 20 mg
  Filled 2018-10-16 (×13): qty 1
  Filled 2018-10-16: qty 2
  Filled 2018-10-16 (×9): qty 1

## 2018-10-16 MED ORDER — MAGNESIUM SULFATE 4 GM/100ML IV SOLN
4.0000 g | Freq: Once | INTRAVENOUS | Status: AC
  Administered 2018-10-16: 4 g via INTRAVENOUS
  Filled 2018-10-16: qty 100

## 2018-10-16 MED ORDER — PANTOPRAZOLE SODIUM 40 MG PO PACK
40.0000 mg | PACK | Freq: Every day | ORAL | Status: DC
  Administered 2018-10-16 – 2018-11-08 (×22): 40 mg
  Filled 2018-10-16 (×25): qty 20

## 2018-10-16 MED ORDER — SCOPOLAMINE 1 MG/3DAYS TD PT72
1.0000 | MEDICATED_PATCH | TRANSDERMAL | Status: DC
  Administered 2018-10-16 – 2018-11-06 (×8): 1.5 mg via TRANSDERMAL
  Filled 2018-10-16 (×8): qty 1

## 2018-10-16 NOTE — Progress Notes (Signed)
Oncology Nurse Navigator Documentation  Met with patient during initial consult with Dr. Isidore Moos.  He arrived by bed from [facility] 1226 with trach collar at 3 L.    . Introduced myself as his Navigator, explained my role as a member of the Care Team.   . Provided New Patient Information packet, discussed contents: o Contact information for physician(s), myself, other members of the Care Team. o Advance Directive information ([place] blue pamphlet with LCSW contact info); provided [facility] AD booklet at his request, encouraged him to contact Gwinda [place] LCSW to complete. o Fall Prevention Patient [facility] map with highlight of [place] o SLP information sheet o Symptom Management [facility] information . Provided introductory explanation of radiation treatment including SIM planning and purpose of Aquaplast head and shoulder mask, showed him example.    Accompanied him to CT SIM, suctioned trach prior to transfer to table, provided support to [facility] team.  He tolerated procedure without difficulty/distress.  He voiced understanding I will be providing him support as he proceeds with tmt, encouraged him to call me upon his DC with questions/concerns.  Gayleen Orem, RN, BSN Head & Neck Oncology Nurse [place] at [place] [phone]

## 2018-10-16 NOTE — Progress Notes (Signed)
Head and Neck Cancer Simulation, IMRT treatment planning note   Inpatient  Diagnosis:    ICD-10-CM   1. Squamous cell carcinoma of glottis (HCC)  C32.0     The patient was taken to the CT simulator and identity was confirmed.  All relevant records and images related to the planned course of therapy were reviewed.  The patient freely provided informed written consent to proceed with treatment after reviewing the details related to the planned course of therapy. The consent form was witnessed and verified by the simulation staff.    The patient was laid in the supine position on the table. An Aquaplast head and shoulder mask was custom fitted to the patient's anatomy. High-resolution CT axial imaging was obtained of the head and neck with contrast. I verified that the quality of the imaging is good for treatment planning. 1 Medically Necessary Treatment Device was fabricated and supervised by me: Aquaplast mask.  Treatment planning note I plan to treat the patient with IMRT. I plan to treat the patient's tumor and bilateral neck nodes. I plan to treat to a total dose of 70 Gray in 35  fractions. Dose calculation was ordered from dosimetry.  IMRT planning Note  IMRT is medically necessary and an important modality to deliver adequate dose to the patient's at risk tissues while sparing the patient's normal structures, including the: esophagus, parotid tissue, mandible, brain stem, spinal cord, oral cavity, brachial plexus.  This justifies the use of IMRT in the patient's treatment.    Anticipate start date Tues, 7-28. -----------------------------------  Eppie Gibson, MD

## 2018-10-16 NOTE — Progress Notes (Signed)
SLP Cancellation Note  Patient Details Name: Logan Price MRN: [id number] DOB: 03-24-1946   Cancelled treatment:       Reason Eval/Treat Not Completed: (RN reports pt with too many secretions for pmsv today, to go for XRT in approximately 30 minutes, pt has PEG also, will continue efforts)   Macario Golds 10/16/2018, 10:55 AM  Luanna Salk, MS [facility] SLP [place] Pager [phone] Office [phone]

## 2018-10-16 NOTE — Progress Notes (Signed)
PROGRESS NOTE    Logan Price  JSE:[id number] DOB: 06/05/1945 DOA: 09/27/2018 PCP: [name], FNP    Brief Narrative:  73 year old with history of GERD, essential hypertension, hyperlipidemia who recently diagnosed neck mass concerning for malignancy was admitted to the hospital.  Overnight patient went into SVT therefore medical team was consulted.  Patient was given adenosine x1 which converted back to normal sinus rhythm.  G-tube placed by surgery 7/13, tolerating tube feeds.  Metoprolol was started via PEG tube.  Hospitals course was later on complicated by Pseudomonas bacteremia secondary to infected Port-A-Cath.  Patient was transferred to medicine service.  Infectious disease team were consulted who recommended removing Port-A-Cath.    Assessment & Plan:   Principal Problem:   Squamous cell carcinoma of glottis (HCC) Active Problems:   Laryngeal mass   Goals of care, counseling/discussion   Dysphagia   Loss of weight   PSVT (paroxysmal supraventricular tachycardia) (HCC)   Protein-calorie malnutrition, severe   Bacteremia   Leukocytosis   Normocytic anemia   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HCC)  Pseudomonas bacteremia -Likely source was Port-A-Cath which was removed on 10/14/2018.    Patient seen in consultation by ID who are recommending continuation of IV cefepime for total of 2 weeks post Port-A-Cath removal.  Repeat blood cultures pending.  Repeat blood cultures negative, PICC line can be placed in case if he gets transition to skilled nursing facility in the meantime.  Acute kidney injury secondary to acute urinary retention -Patient had over 1 L of urine in his bladder.  Creatinine as high as 5.3, after placing Foley catheter 10/14/2018 his creatinine has trended down to 0.65.  Continue Foley catheter.   Urine output of 5.6 L over the past 24 hours.  Monitor urine output could likely try a voiding trial in a few days.  Follow for now.    Paroxysmal supraventricular  tachycardia; intermittent and recurrent -??  Etiology.  Patient with narrow complex tachycardia with a regular rate.  Patient received a dose of adenosine now in sinus rhythm with heart rates in the 90s.  TSH within normal limits.  2D echo normal.  Keep potassium > 4.  Keep magnesium > 2.  Continue current dose of metoprolol via G-tube.  Appreciate cardiology input and recommendations.   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  Patient with increasing secretions.   Status post G-tube placement 7/13.    Patient currently on tube feeds.  -Due to patient's increased secretions will place on a scopolamine patch.  Patient not on Robinul on MAR.   -Nutrition team is following. -Patient being followed by oncology.  Patient was transferred to [facility] for radiation oncology evaluation and to be started on radiation treatment today 10/16/2018.  Janina Mayo care per ENT.   DVT prophylaxis: Heparin Code Status: Full Family Communication: Updated patient. Disposition Plan: To be determined.   Consultants:   Oncology: Dr. Dion Body 09/30/2018  Interventional radiology: Dr. Ardelle Anton 10/02/2018  [facility] hospitalist: Dr. Julian Reil 10/03/2018  General surgery: Dr. Luisa Hart 10/06/2018  Dental surgery: Dr. Valentino Hue 10/08/2018  Cardiology: Dr. Wyline Mood 10/13/2018  Infectious disease: Dr. Daiva Eves 10/13/2018  Radiation oncology  Procedures:   CT soft tissue neck 09/28/2018  CT chest with contrast 09/29/2018  CT abdomen 10/01/2018  Orthopantogram 10/08/2018  Percutaneous placement of gastrostomy tube 10/04/2018 per Dr. Ardelle Anton interventional radiology  Port-A-Cath removal per interventional radiology 10/14/2018  Antimicrobials:   IV cefepime 10/12/2018 >>>>  10/29/2018  IV Levaquin 10/11/2018>>>>> 10/12/2018   Subjective: Patient laying in bed suctioning himself with increased secretions.  Denies any chest pain.   Denies any significant worsening shortness of breath.  Patient for first radiation treatment today.  Objective: Vitals:   10/16/18 1115 10/16/18 1300 10/16/18 1400 10/16/18 1612  BP:  (!) 157/69 (!) 143/64   Pulse: (!) 101 (!) 104 100 92  Resp: 20 (!) 24 (!) 28   Temp:  98.3 F (36.8 C)    TempSrc:  Oral    SpO2: 95% 98% 97% 97%  Weight:      Height:        Intake/Output Summary (Last 24 hours) at 10/16/2018 1628 Last data filed at 10/16/2018 1452 Gross per 24 hour  Intake 3067.6 ml  Output 4300 ml  Net -1232.4 ml   Filed Weights   10/14/18 0426 10/15/18 0350 10/16/18 0500  Weight: 70.9 kg 69.2 kg 69.3 kg    Examination:  General exam: Appears calm and comfortable  Respiratory system: Some coarse breath sounds anterior lung fields.  Increased secretions noted.  Normal respiratory effort.   Cardiovascular system: S1 & S2 heard, RRR. No JVD, murmurs, rubs, gallops or clicks. No pedal edema. Gastrointestinal system: Abdomen is nondistended, soft and nontender. No organomegaly or masses felt. Normal bowel sounds heard. Central nervous system: Alert and oriented. No focal neurological deficits. Extremities: Symmetric 5 x 5 power. Skin: No rashes, lesions or ulcers Psychiatry: Judgement and insight appear normal. Mood & affect appropriate.     Data Reviewed: I have personally reviewed following labs and imaging studies  CBC: Recent Labs  Lab 10/10/18 0603 10/11/18 0416 10/13/18 0901 10/14/18 0252 10/15/18 0743 10/16/18 0807  WBC 21.2* 23.7* 10.3 11.7* 10.0 9.0  NEUTROABS 19.1* 20.8*  --   --   --   --   HGB 11.6* 11.2* 10.1* 10.8* 10.3* 10.2*  HCT 36.1* 34.3* 31.2* 32.8* 30.9* 32.5*  MCV 88.7 87.5 87.2 86.1 84.7 90.3  PLT 328 305 246 247 240 309   Basic Metabolic Panel: Recent Labs  Lab 10/10/18 0603 10/11/18 0416 10/12/18 0226 10/13/18 1126 10/14/18 1847 10/15/18 0743 10/15/18 1029 10/16/18 0807  NA 132* 129* 129* 132* 134* 135  --  139  K 4.1 4.6 4.5  4.7 5.1 4.9  --  4.1  CL 94* 94* 96* 100 101 105  --  104  CO2 26 24 24  21* 21* 24  --  30  GLUCOSE 161* 150* 144* 174* 155* 161*  --  121*  BUN 14 18 27* 54* 81* 40*  --  17  CREATININE 0.67 0.94 1.19 3.63* 5.31* 1.70*  --  0.65  CALCIUM 8.9 8.7* 8.1* 8.0* 8.2* 8.4*  --  8.5*  MG 1.5* 2.6* 2.0 2.1  --   --  1.8 1.7  PHOS 3.5 5.2* 4.0 4.2  --   --   --   --    GFR: Estimated Creatinine Clearance: 83 mL/min (by C-G formula based on SCr of 0.65 mg/dL). Liver Function Tests: No results for input(s): AST, ALT, ALKPHOS, BILITOT, PROT, ALBUMIN in the last 168 hours. No results for input(s): LIPASE, AMYLASE in the last 168 hours. No results for input(s): AMMONIA in the last 168 hours. Coagulation Profile: Recent Labs  Lab 10/14/18 0252  INR 1.3*   Cardiac Enzymes: No results for input(s): CKTOTAL, CKMB, CKMBINDEX, TROPONINI in the last 168 hours. BNP (last 3 results) No results for input(s): PROBNP in the last 8760 hours.  HbA1C: No results for input(s): HGBA1C in the last 72 hours. CBG: Recent Labs  Lab 10/15/18 2337 10/16/18 0333 10/16/18 0751 10/16/18 1300 10/16/18 1547  GLUCAP 131* 104* 125* 119* 124*   Lipid Profile: No results for input(s): CHOL, HDL, LDLCALC, TRIG, CHOLHDL, LDLDIRECT in the last 72 hours. Thyroid Function Tests: No results for input(s): TSH, T4TOTAL, FREET4, T3FREE, THYROIDAB in the last 72 hours. Anemia Panel: No results for input(s): VITAMINB12, FOLATE, FERRITIN, TIBC, IRON, RETICCTPCT in the last 72 hours. Sepsis Labs: Recent Labs  Lab 10/11/18 0648  PROCALCITON <0.10  LATICACIDVEN 1.5    Recent Results (from the past 240 hour(s))  Gastrointestinal Panel by PCR , Stool     Status: None   Collection Time: 10/10/18  1:33 AM   Specimen: Stool  Result Value Ref Range Status   Campylobacter species NOT DETECTED NOT DETECTED Final   Plesimonas shigelloides NOT DETECTED NOT DETECTED Final   Salmonella species NOT DETECTED NOT DETECTED Final    Yersinia enterocolitica NOT DETECTED NOT DETECTED Final   Vibrio species NOT DETECTED NOT DETECTED Final   Vibrio cholerae NOT DETECTED NOT DETECTED Final   Enteroaggregative E coli (EAEC) NOT DETECTED NOT DETECTED Final   Enteropathogenic E coli (EPEC) NOT DETECTED NOT DETECTED Final   Enterotoxigenic E coli (ETEC) NOT DETECTED NOT DETECTED Final   Shiga like toxin producing E coli (STEC) NOT DETECTED NOT DETECTED Final   Shigella/Enteroinvasive E coli (EIEC) NOT DETECTED NOT DETECTED Final   Cryptosporidium NOT DETECTED NOT DETECTED Final   Cyclospora cayetanensis NOT DETECTED NOT DETECTED Final   Entamoeba histolytica NOT DETECTED NOT DETECTED Final   Giardia lamblia NOT DETECTED NOT DETECTED Final   Adenovirus F40/41 NOT DETECTED NOT DETECTED Final   Astrovirus NOT DETECTED NOT DETECTED Final   Norovirus GI/GII NOT DETECTED NOT DETECTED Final   Rotavirus A NOT DETECTED NOT DETECTED Final   Sapovirus (I, II, IV, and V) NOT DETECTED NOT DETECTED Final    Comment: Performed at [facility], [address] [place], [place] [address]  Culture, blood (routine x 2)     Status: Abnormal   Collection Time: 10/11/18  6:47 AM   Specimen: BLOOD LEFT HAND  Result Value Ref Range Status   Specimen Description BLOOD LEFT HAND  Final   Special Requests   Final    BOTTLES DRAWN AEROBIC ONLY Blood Culture results may not be optimal due to an inadequate volume of blood received in culture bottles   Culture  Setup Time   Final    AEROBIC BOTTLE ONLY GRAM NEGATIVE RODS CRITICAL RESULT CALLED TO, READ BACK BY AND VERIFIED WITH: Melven [facility] 10/12/18 0425 JDW Performed at [facility], [address] [place], [place] [address]    Culture PSEUDOMONAS AERUGINOSA (A)  Final   Report Status 10/14/2018 FINAL  Final   Organism ID, Bacteria PSEUDOMONAS AERUGINOSA  Final      Susceptibility   Pseudomonas aeruginosa - MIC*    CEFTAZIDIME 4 SENSITIVE Sensitive     CIPROFLOXACIN <=0.25  SENSITIVE Sensitive     GENTAMICIN <=1 SENSITIVE Sensitive     IMIPENEM <=0.25 SENSITIVE Sensitive     PIP/TAZO 8 SENSITIVE Sensitive     CEFEPIME 2 SENSITIVE Sensitive     * PSEUDOMONAS AERUGINOSA  Blood Culture ID Panel (Reflexed)     Status: Abnormal   Collection Time: 10/11/18  6:47 AM  Result Value Ref Range Status   Enterococcus species NOT DETECTED NOT DETECTED  Final   Listeria monocytogenes NOT DETECTED NOT DETECTED Final   Staphylococcus species NOT DETECTED NOT DETECTED Final   Staphylococcus aureus (BCID) NOT DETECTED NOT DETECTED Final   Streptococcus species NOT DETECTED NOT DETECTED Final   Streptococcus agalactiae NOT DETECTED NOT DETECTED Final   Streptococcus pneumoniae NOT DETECTED NOT DETECTED Final   Streptococcus pyogenes NOT DETECTED NOT DETECTED Final   Acinetobacter baumannii NOT DETECTED NOT DETECTED Final   Enterobacteriaceae species NOT DETECTED NOT DETECTED Final   Enterobacter cloacae complex NOT DETECTED NOT DETECTED Final   Escherichia coli NOT DETECTED NOT DETECTED Final   Klebsiella oxytoca NOT DETECTED NOT DETECTED Final   Klebsiella pneumoniae NOT DETECTED NOT DETECTED Final   Proteus species NOT DETECTED NOT DETECTED Final   Serratia marcescens NOT DETECTED NOT DETECTED Final   Carbapenem resistance NOT DETECTED NOT DETECTED Final   Haemophilus influenzae NOT DETECTED NOT DETECTED Final   Neisseria meningitidis NOT DETECTED NOT DETECTED Final   Pseudomonas aeruginosa DETECTED (A) NOT DETECTED Final    Comment: CRITICAL RESULT CALLED TO, READ BACK BY AND VERIFIED WITH: J LEDFORD PHARMD 10/12/18 0425 JDW    Candida albicans NOT DETECTED NOT DETECTED Final   Candida glabrata NOT DETECTED NOT DETECTED Final   Candida krusei NOT DETECTED NOT DETECTED Final   Candida parapsilosis NOT DETECTED NOT DETECTED Final   Candida tropicalis NOT DETECTED NOT DETECTED Final    Comment: Performed at [facility], [address], [place], [place] [address]    Culture, blood (routine x 2)     Status: Abnormal   Collection Time: 10/11/18  7:45 AM   Specimen: BLOOD  Result Value Ref Range Status   Specimen Description BLOOD RIGHT ANTECUBITAL  Final   Special Requests   Final    BOTTLES DRAWN AEROBIC ONLY Blood Culture adequate volume   Culture  Setup Time   Final    AEROBIC BOTTLE ONLY GRAM NEGATIVE RODS CRITICAL VALUE NOTED.  VALUE IS CONSISTENT WITH PREVIOUSLY REPORTED AND CALLED VALUE.    Culture (A)  Final    PSEUDOMONAS AERUGINOSA SUSCEPTIBILITIES PERFORMED ON PREVIOUS CULTURE WITHIN THE LAST 5 DAYS. Performed at [facility], [address], [place], [place] [address]    Report Status 10/14/2018 FINAL  Final  Culture, respiratory (non-expectorated)     Status: None   Collection Time: 10/13/18  4:26 AM   Specimen: Tracheal Aspirate; Respiratory  Result Value Ref Range Status   Specimen Description TRACHEAL ASPIRATE  Final   Special Requests NONE  Final   Gram Stain NO WBC SEEN NO ORGANISMS SEEN   Final   Culture   Final    Consistent with normal respiratory flora. Performed at [facility], [address] [place], [place] [address]    Report Status 10/15/2018 FINAL  Final  Culture, blood (routine x 2)     Status: None (Preliminary result)   Collection Time: 10/15/18  7:43 AM   Specimen: BLOOD LEFT WRIST  Result Value Ref Range Status   Specimen Description BLOOD LEFT WRIST  Final   Special Requests   Final    BOTTLES DRAWN AEROBIC ONLY Blood Culture adequate volume   Culture   Final    NO GROWTH 1 DAY Performed at [facility], [address] [place], [place] [address]    Report Status PENDING  Incomplete  Culture, blood (routine x 2)     Status: None (Preliminary result)   Collection Time: 10/15/18  7:51 AM   Specimen: BLOOD  Result Value Ref Range Status   Specimen Description BLOOD LEFT ANTECUBITAL  Final   Special Requests   Final    BOTTLES DRAWN AEROBIC ONLY Blood Culture adequate volume    Culture   Final    NO GROWTH 1 DAY Performed at [facility], [address] [place], [place] [address]    Report Status PENDING  Incomplete  MRSA PCR Screening     Status: None   Collection Time: 10/15/18  6:17 PM   Specimen: Nasopharyngeal  Result Value Ref Range Status   MRSA by PCR NEGATIVE NEGATIVE Final    Comment:        The GeneXpert MRSA Assay (FDA approved for NASAL specimens only), is one component of a comprehensive MRSA colonization surveillance program. It is not intended to diagnose MRSA infection nor to guide or monitor treatment for MRSA infections. Performed at [facility], [address], [place], [place] [address]          Radiology Studies: No results found.      Scheduled Meds:  amLODipine  5 mg Per Tube Daily   chlorhexidine  15 mL Mouth Rinse BID   Chlorhexidine Gluconate Cloth  6 each Topical Daily   doxazosin  1 mg Per Tube Daily   feeding supplement (OSMOLITE 1.5 CAL)  237 mL Per Tube 6 X Daily   feeding supplement (PRO-STAT SUGAR FREE 64)  30 mL Per Tube BID   free water  200 mL Per Tube Q8H   heparin injection (subcutaneous)  5,000 Units Subcutaneous Q8H   hydrocortisone   Rectal QID   lidocaine  1 application Urethral Once   mouth rinse  15 mL Mouth Rinse q12n4p   metoprolol tartrate  25 mg Per Tube QID   pantoprazole sodium  40 mg Per Tube Daily   scopolamine  1 patch Transdermal Q72H   simethicone  160 mg Per Tube QID   simvastatin  20 mg Per Tube q1800   Continuous Infusions:  sodium chloride 125 mL/hr at 10/16/18 1452   ceFEPime (MAXIPIME) IV Stopped (10/16/18 1014)   lactated ringers 10 mL/hr at 10/07/18 0859   sodium chloride 500 mL/hr at 10/14/18 2049     LOS: 19 days    Time spent: 40 minutes    Ramiro Harvest, MD [facility]  If 7PM-7AM, please contact night-coverage [email or web address] 10/16/2018, 4:28 PM

## 2018-10-16 NOTE — Progress Notes (Signed)
PT Cancellation Note  Patient Details Name: Logan Price MRN: [id number] DOB: April 21, 1945   Cancelled Treatment:    Reason Eval/Treat Not Completed: Attempted PT tx session. Pt declined to participate on today. Will check back another day.   Weston Anna, PT Acute Rehabilitation Services Pager: [phone] Office: [phone]

## 2018-10-17 ENCOUNTER — Ambulatory Visit (INDEPENDENT_AMBULATORY_CARE_PROVIDER_SITE_OTHER): Payer: Medicare Other | Admitting: Otolaryngology

## 2018-10-17 LAB — BASIC METABOLIC PANEL
Anion gap: 8 (ref 5–15)
BUN: 14 mg/dL (ref 8–23)
CO2: 25 mmol/L (ref 22–32)
Calcium: 8.1 mg/dL — ABNORMAL LOW (ref 8.9–10.3)
Chloride: 104 mmol/L (ref 98–111)
Creatinine, Ser: 0.6 mg/dL — ABNORMAL LOW (ref 0.61–1.24)
GFR calc Af Amer: >60 mL/min (ref >60)
GFR calc non Af Amer: >60 mL/min (ref >60)
Glucose, Bld: 129 mg/dL — ABNORMAL HIGH (ref 70–99)
Potassium: 4.2 mmol/L (ref 3.5–5.1)
Sodium: 137 mmol/L (ref 135–145)

## 2018-10-17 LAB — CBC
HCT: 31.3 % — ABNORMAL LOW (ref 39.0–52.0)
Hemoglobin: 9.5 g/dL — ABNORMAL LOW (ref 13.0–17.0)
MCH: 27.5 pg (ref 26.0–34.0)
MCHC: 30.4 g/dL (ref 30.0–36.0)
MCV: 90.7 fL (ref 80.0–100.0)
Platelets: 327 10*3/uL (ref 150–400)
RBC: 3.45 MIL/uL — ABNORMAL LOW (ref 4.22–5.81)
RDW: 14.3 % (ref 11.5–15.5)
WBC: 10.1 10*3/uL (ref 4.0–10.5)
nRBC: 0 % (ref 0.0–0.2)

## 2018-10-17 LAB — GLUCOSE, CAPILLARY
Glucose-Capillary: 130 mg/dL — ABNORMAL HIGH (ref 70–99)
Glucose-Capillary: 139 mg/dL — ABNORMAL HIGH (ref 70–99)
Glucose-Capillary: 98 mg/dL (ref 70–99)
Glucose-Capillary: 122 mg/dL — ABNORMAL HIGH (ref 70–99)
Glucose-Capillary: 108 mg/dL — ABNORMAL HIGH (ref 70–99)
Glucose-Capillary: 100 mg/dL — ABNORMAL HIGH (ref 70–99)
Glucose-Capillary: 155 mg/dL — ABNORMAL HIGH (ref 70–99)

## 2018-10-17 LAB — MAGNESIUM: Magnesium: 2 mg/dL (ref 1.7–2.4)

## 2018-10-17 MED ORDER — JEVITY 1.5 CAL/FIBER PO LIQD
237.0000 mL | Freq: Three times a day (TID) | ORAL | Status: DC
  Administered 2018-10-17 – 2018-10-21 (×10): 237 mL
  Filled 2018-10-17 (×13): qty 1000

## 2018-10-17 MED ORDER — OSMOLITE 1.5 CAL PO LIQD
237.0000 mL | Freq: Three times a day (TID) | ORAL | Status: DC
  Administered 2018-10-17 – 2018-10-21 (×9): 237 mL
  Filled 2018-10-17 (×13): qty 237

## 2018-10-17 MED ORDER — FREE WATER
150.0000 mL | Status: DC
  Administered 2018-10-17 – 2018-10-24 (×42): 150 mL

## 2018-10-17 NOTE — Progress Notes (Signed)
Received pt from ICU/SD in stable condition. Pt placed on telemetry, trach collar at 28% sats 92-95%. Pt sitting in chair , ambulated to bed with one assist. Pt VS's noted in computer. Oriented room. PEG tube patent and flushed. SRP, RN

## 2018-10-17 NOTE — Plan of Care (Signed)
PT goals extended.

## 2018-10-17 NOTE — Plan of Care (Signed)

## 2018-10-17 NOTE — Progress Notes (Signed)
Speech Language Pathology Treatment: [name] Speaking valve  Patient Details Name: Logan Price MRN: [id number] DOB: 08-May-1945 Today's Date: 10/17/2018 Time: 4098-1191 SLP Time Calculation (min) (ACUTE ONLY): 15 min  Assessment / Plan / Recommendation Clinical Impression  Patient seen to address speech/PMV goals and dysphagia goals, however he declined ice chip trials. Patient had just completed PT session, and in addition, nursing staff was getting his things packed up to move to a different unit in hospital, so patient appeared both distracted and fatigued. His responses to SLP's questions were brief and required cues to elaborate. When SLP asked if he remembered any exercises or instructions that previous SLP's had reviewed with him, he would just say no and did not exhibit any sign of recollection. Voice was mildly wet/gurgled, weak with low vocal intensity and mild-moderately hoarse. He did not exhibit any s/s of difficulty or distress with PMV in place, but he did report that during PT session, he had more difficulty breathing with PMV on. SLP advised patient to take PMV off during strenuous activities for now.   HPI HPI: Pt is a 73 yo male admitted for planned trach and laryngeal biopsy 7/2. 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 Esophagram 6/26 showed silent gross aspiration of barium. PMH includes: HLD, HTN, hepatitis C, GERD, diverticulitis      SLP Plan  Continue with current plan of care       Recommendations         Patient may use Passy-Muir Speech Valve: During all waking hours (remove during sleep) PMSV Supervision: Intermittent         Oral Care Recommendations: Oral care QID Follow up Recommendations: Outpatient SLP;Home health SLP SLP Visit Diagnosis: Aphonia (R49.1) Plan: Continue with current plan of care       GO                Logan Price 10/17/2018, 5:57 PM     Angela Nevin, MA, CCC-SLP Speech Therapy YN:[id number] Rehab

## 2018-10-17 NOTE — Progress Notes (Addendum)
Nutrition Follow-up  DOCUMENTATION CODES:   Severe malnutrition in context of chronic illness  INTERVENTION:  - 1 carton Osmolite 1.5 TID and 1 carton Jevity 1.5 TID with 30 ml prostat BID. - this regimen will provide 2330 kcal, 120 grams protein, 15 grams fiber, and 1083 ml free water. - will increase free water flush to 150 ml free water every 4 hours d/t addition of fiber, ongoing diarrhea (900 ml/day).  *infuse TF boluses over 10-15 minutes to aid in digestion and possible decrease of diarrhea.    NUTRITION DIAGNOSIS:   Severe Malnutrition related to chronic illness, cancer and cancer related treatments as evidenced by percent weight loss, severe muscle depletion, moderate muscle depletion, mild fat depletion. -ongoing  GOAL:   Patient will meet greater than or equal to 90% of their needs -met with TF regimen  MONITOR:   Labs, I & O's, Skin, TF tolerance, Weight trends  ASSESSMENT:   Patient with PMH significant for diverticulitis, gastritis, HLD, HTN, laryngeal cancer, and esophogeal dysphagia. 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  Per chart review, weight has been stable over the past week. Patient transferred from [facility] to [facility] for radiation oncology evaluation and to be started on radiation treatment on 7/22. Port-a-cath was thought to be a source of infection and was subsequently removed on 7/20. Only IV access at this time is peripheral.   Patient with trach collar and is able to utilize PMV to talk with RD this AM. He has PEG and is receiving 1 can Osmolite 1.5 x6/day, 30 ml prostat BID, and 200 ml free water TID. This regimen is providing 2330 kcal, 119 grams protein, and 1686 ml free water.   Patient denies any discomfort with feeds or any feelings of overt fullness after receiving boluses. He confirms that diarrhea is ongoing, is intermittently associated with nausea or heartburn-like  sensation, and intermittent vomiting. Current TF formula does not contain fiber. Will adjust TF regimen as outlined above to provide soluble fiber to assess if this helps with diarrhea. He states that stools are completely liquid, no solid.    Labs reviewed; CBGs: 98 and 122 mg/dl today, creatinine: 0.6 mg/dl, Ca: 8.1 mg/dl. Medications reviewed; 4 g IV Mg sulfate x1 run 4/36, 067 mg mylicon per PEG QID.  IVF; NS @ 125 ml/hr.    Diet Order:   Diet Order            Diet NPO time specified Except for: Sips with Meds  Diet effective midnight        Diet general              EDUCATION NEEDS:   Not appropriate for education at this time  Skin:  Skin Assessment: Skin Integrity Issues: Skin Integrity Issues:: Incisions, Other (Comment) Incisions: neck/abdomen, R chest (7/20) Other: new wound-sacrum  Last BM:  7/22  Height:   Ht Readings from Last 1 Encounters:  09/27/18 5' 9" (1.753 m)    Weight:   Wt Readings from Last 1 Encounters:  10/16/18 69.3 kg    Ideal Body Weight:  72.7 kg  BMI:  Body mass index is 22.56 kg/m.  Estimated Nutritional Needs:   Kcal:  2300-2500 kcal  Protein:  115-130 grams  Fluid:  >/= 2.3 L/day     Jarome Matin, MS, RD, LDN, [facility] Inpatient Clinical Dietitian Pager # [phone] After hours/weekend pager # [phone]

## 2018-10-17 NOTE — Progress Notes (Signed)
Referring Physician(s): Guilford Shi  Supervising Physician: Jacqulynn Cadet  Patient Status:  [facility] - In-pt  Chief Complaint: None  Subjective:  Laryngeal cancer s/p Port-a-cath placement in IR 10/04/2018 by Dr. Earleen Newport, subsequently developed bacteremia thought to be cause by Port-a-cath infection s/p Port-a-cath removal 10/14/2018 by Dr. Kathlene Cote, findings revealed likely infected hematoma so wound left open. Patient awake and alert laying in bed watching TV with no complaints at this time. Right chest wound at site of Port-a-cath removal stable with dressing intact.   Allergies: Patient has no known allergies.  Medications: Prior to Admission medications   Medication Sig Start Date End Date Taking? Authorizing Provider  amLODipine (NORVASC) 10 MG tablet TAKE 1 TABLET DAILY 08/05/18  Yes Hawks, Christy A, FNP  aspirin EC 81 MG tablet Take 81 mg by mouth daily.   Yes [provider]  Cholecalciferol (VITAMIN D) 2000 units CAPS Take 2,000 Units by mouth daily.   Yes [provider]  fluticasone (FLONASE) 50 MCG/ACT nasal spray SPRAY 2 SPRAYS INTO EACH NOSTRIL EVERY DAY Patient taking differently: Place 2 sprays into both nostrils daily.  08/21/18  Yes Hawks, Christy A, FNP  Multiple Vitamin (MULTIVITAMIN WITH MINERALS) TABS tablet Take 1 tablet by mouth daily.   Yes [provider]  pantoprazole (PROTONIX) 40 MG tablet 1 po 30 mins prior to first meal Patient taking differently: Take 40 mg by mouth daily. 1 po 30 mins prior to first meal 12/12/17  Yes Fields, Sandi L, MD  simvastatin (ZOCOR) 20 MG tablet TAKE 1 TABLET AT BEDTIME 08/05/18  Yes Hawks, Christy A, FNP  Amino Acids-Protein Hydrolys (FEEDING SUPPLEMENT, PRO-STAT SUGAR FREE 64,) LIQD Place 30 mLs into feeding tube 2 (two) times daily. 10/09/18   Leta Baptist, MD  doxazosin (CARDURA) 1 MG tablet Place 1 tablet (1 mg total) into feeding tube daily. 10/10/18   Leta Baptist, MD  metoprolol tartrate  (LOPRESSOR) 25 mg/10 mL SUSP Place 5 mLs (12.5 mg total) into feeding tube 2 (two) times daily. 10/09/18   Leta Baptist, MD  Nutritional Supplements (FEEDING SUPPLEMENT, OSMOLITE 1.5 CAL,) LIQD Place 1,000 mLs into feeding tube continuous. 10/09/18   Leta Baptist, MD     Vital Signs: BP (!) 148/48   Pulse 86   Temp 98 F (36.7 C) (Oral)   Resp (!) 22   Ht 5\' 9"  (1.753 m)   Wt 152 lb 12.5 oz (69.3 kg)   SpO2 98%   BMI 22.56 kg/m   Physical Exam Vitals signs and nursing note reviewed.  Constitutional:      General: He is not in acute distress.    Appearance: Normal appearance.     Comments: Tracheostomy.  Pulmonary:     Effort: Pulmonary effort is normal. No respiratory distress.     Comments: Tracheostomy. Skin:    General: Skin is warm and dry.     Comments: Right chest wound at site of Port-a-cath appears to be healing well, wound with mild tenderness, no signs of infection, erythema, drainage, or active bleeding; site was re-dressed with wet-to-dry pressure dressing.  Neurological:     Mental Status: He is alert and oriented to person, place, and time.  Psychiatric:        Mood and Affect: Mood normal.        Behavior: Behavior normal.        Thought Content: Thought content normal.        Judgment: Judgment normal.     Imaging: Ir  Removal [employer] W/ [place] W/o [place] Mod Sed  Result Date: 10/14/2018 CLINICAL DATA:  History of head and neck squamous cell carcinoma. Status post right upper chest subcutaneous Port-A-Cath placement on 10/04/2018 with catheter access via the right internal jugular vein. Development Pseudomonas bacteremia now requiring Port-A-Cath removal due to suspected catheter seeding/source for infection. EXAM: REMOVAL OF IMPLANTED TUNNELED PORT-A-CATH MEDICATIONS: None PROCEDURE: The right chest Port-A-Cath site was prepped with chlorhexidine. A sterile gown and gloves were worn during the procedure. Local anesthesia was provided with 1% lidocaine. An incision was  made overlying the Port-A-Cath with a #15 scalpel. Utilizing sharp and blunt dissection, the Port-A-Cath was removed. The pocked was debrided with gauze soaked in sterile saline. Iodoform gauze was then packed into the wound. A dressing was then applied over the iodoform gauze. FINDINGS: After making an incision, the port pocket was inspected and demonstrates bloody fluid which is mildly turbulent and felt to be likely infected. Tissue was also indurated and therefore the pocket was left open to heal secondarily. After debridement, iodoform gauze was packed into the port wound. The entire Port-A-Cath and attached catheter were successfully removed. IMPRESSION: Removal of implanted Port-A-Cath utilizing sharp and blunt dissection. The port pocket demonstrates what appears to be likely infected hematoma. After debridement of bloody fluid and clot, the wound was packed with iodoform gauze. Electronically Signed   By: Aletta Edouard M.D.   On: 10/14/2018 14:34    Labs:  CBC: Recent Labs    10/14/18 0252 10/15/18 0743 10/16/18 0807 10/17/18 0207  WBC 11.7* 10.0 9.0 10.1  HGB 10.8* 10.3* 10.2* 9.5*  HCT 32.8* 30.9* 32.5* 31.3*  PLT 247 240 309 327    COAGS: Recent Labs    10/03/18 0400 10/14/18 0252  INR 1.2 1.3*    BMP: Recent Labs    10/14/18 1847 10/15/18 0743 10/16/18 0807 10/17/18 0207  NA 134* 135 139 137  K 5.1 4.9 4.1 4.2  CL 101 105 104 104  CO2 21* 24 30 25   GLUCOSE 155* 161* 121* 129*  BUN 81* 40* 17 14  CALCIUM 8.2* 8.4* 8.5* 8.1*  CREATININE 5.31* 1.70* 0.65 0.60*  GFRNONAA 10* 40* >60 >60  GFRAA 12* 46* >60 >60    LIVER FUNCTION TESTS: Recent Labs    10/23/17 0905 04/25/18 0919  BILITOT 0.4 0.4  AST 20 21  ALT 11 16  ALKPHOS 49 60  PROT 6.7 7.2  ALBUMIN 4.4 4.6    Assessment and Plan:  Laryngeal cancer s/p Port-a-cath placement in IR 10/04/2018 by Dr. Earleen Newport, subsequently developed bacteremia thought to be cause by Port-a-cath infection s/p  Port-a-cath removal 10/14/2018 by Dr. Kathlene Cote, findings revealed likely infected hematoma so wound left open. Right chest wound at site of Port-a-cath removal stable with dressing intact, dressing removed and site appears to be healing well, no signs of of active bleeding or infection. New wet-to-dry pressure dressing applied to site. RN aware. Plan for daily wet-to-dry dressing changes. IR to follow.   Electronically Signed: Earley Abide, PA-C 10/17/2018, 10:53 AM   I spent a total of 25 Minutes at the the patient's bedside AND on the patient's hospital floor or unit, greater than 50% of which was counseling/coordinating care for right chest wound at site of Port-a-cath removal.

## 2018-10-17 NOTE — Progress Notes (Addendum)
PROGRESS NOTE    Logan Price  QMV:[id number] DOB: 05/05/1945 DOA: 09/27/2018 PCP: [name], FNP    Brief Narrative:  73 year old with history of GERD, essential hypertension, hyperlipidemia who recently diagnosed neck mass concerning for malignancy was admitted to the hospital.  Overnight patient went into SVT therefore medical team was consulted.  Patient was given adenosine x1 which converted back to normal sinus rhythm.  G-tube placed by surgery 7/13, tolerating tube feeds.  Metoprolol was started via PEG tube.  Hospitals course was later on complicated by Pseudomonas bacteremia secondary to infected Port-A-Cath.  Patient was transferred to medicine service.  Infectious disease team were consulted who recommended removing Port-A-Cath.    Assessment & Plan:   Principal Problem:   Squamous cell carcinoma of glottis (HCC) Active Problems:   Laryngeal mass   Goals of care, counseling/discussion   Dysphagia   Loss of weight   PSVT (paroxysmal supraventricular tachycardia) (HCC)   Protein-calorie malnutrition, severe   Bacteremia   Leukocytosis   Normocytic anemia   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HCC)  Pseudomonas bacteremia -Likely source was Port-A-Cath which was removed on 10/14/2018.    Patient seen in consultation by ID who are recommended continuation of IV cefepime for total of 2 weeks post Port-A-Cath removal.  Repeat blood cultures pending.  Repeat blood cultures if negative, PICC line can be placed in case if he gets transitioned to skilled nursing facility in the meantime.  Acute kidney injury secondary to acute urinary retention -Patient had over 1 L of urine in his bladder.  Creatinine as high as 5.3, after placing Foley catheter 10/14/2018 his creatinine has trended down to 0.60.  Continue Foley catheter.   Urine output of 4.2 L over the past 24 hours.  Monitor urine output could likely try a voiding trial in a few days.  Follow for now.    Paroxysmal supraventricular  tachycardia; intermittent and recurrent -??  Etiology.  Patient with narrow complex tachycardia with a regular rate.  Patient received a dose of adenosine now in sinus rhythm with heart rates in the 90s.  TSH within normal limits.  2D echo normal.  Keep potassium > 4.  Keep magnesium > 2.  Continue current dose of metoprolol via G-tube.  Appreciate cardiology input and recommendations.   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  Patient with increasing secretions which have improved per RN after being started on scopolamine patch, however patient feels no significant improvement with secretions.   Status post G-tube placement 7/13.    Patient currently on tube feeds.  -Nutrition team is following. -Patient being followed by oncology.  Patient was transferred to [facility] for radiation oncology evaluation and to be started on radiation treatment, 10/16/2018.  Janina Mayo care per ENT.   DVT prophylaxis: Heparin Code Status: Full Family Communication: Updated patient. Disposition Plan: Transfer to telemetry.    Consultants:   Oncology: Dr. Dion Body 09/30/2018  Interventional radiology: Dr. Ardelle Anton 10/02/2018  [facility] hospitalist: Dr. Julian Reil 10/03/2018  General surgery: Dr. Luisa Hart 10/06/2018  Dental surgery: Dr. Valentino Hue 10/08/2018  Cardiology: Dr. Wyline Mood 10/13/2018  Infectious disease: Dr. Daiva Eves 10/13/2018  Radiation oncology  Procedures:   CT soft tissue neck 09/28/2018  CT chest with contrast 09/29/2018  CT abdomen 10/01/2018  Orthopantogram 10/08/2018  Percutaneous placement of gastrostomy tube 10/04/2018 per Dr. Ardelle Anton interventional radiology  Port-A-Cath removal per interventional radiology 10/14/2018  Antimicrobials:   IV cefepime 10/12/2018 >>>>  10/29/2018  IV Levaquin 10/11/2018>>>>> 10/12/2018   Subjective: Patient lying in bed.  Denies any chest pain or shortness of breath.   States still with increased secretions however RN states improvement with significant secretions from yesterday.   Objective: Vitals:   10/17/18 0630 10/17/18 0647 10/17/18 0800 10/17/18 0838  BP:  (!) 148/48    Pulse: 97 88  86  Resp: (!) 29   (!) 22  Temp:   98 F (36.7 C)   TempSrc:   Oral   SpO2: 96%   98%  Weight:      Height:        Intake/Output Summary (Last 24 hours) at 10/17/2018 1013 Last data filed at 10/17/2018 0700 Gross per 24 hour  Intake 3204.24 ml  Output 4200 ml  Net -995.76 ml   Filed Weights   10/14/18 0426 10/15/18 0350 10/16/18 0500  Weight: 70.9 kg 69.2 kg 69.3 kg    Examination:  General exam: NAD Respiratory system: Some coarse breath sounds anterior lung fields.  Normal respiratory effort.   Cardiovascular system: Regular rate rhythm no murmurs rubs or gallops.  No JVD.  1+ right lower extremity edema. Gastrointestinal system: Abdomen is soft, nontender, nondistended, positive bowel sounds.  No rebound.  No guarding.  Central nervous system: Alert and oriented. No focal neurological deficits. Extremities: Symmetric 5 x 5 power. Skin: No rashes, lesions or ulcers Psychiatry: Judgement and insight appear normal. Mood & affect appropriate.     Data Reviewed: I have personally reviewed following labs and imaging studies  CBC: Recent Labs  Lab 10/11/18 0416 10/13/18 0901 10/14/18 0252 10/15/18 0743 10/16/18 0807 10/17/18 0207  WBC 23.7* 10.3 11.7* 10.0 9.0 10.1  NEUTROABS 20.8*  --   --   --   --   --   HGB 11.2* 10.1* 10.8* 10.3* 10.2* 9.5*  HCT 34.3* 31.2* 32.8* 30.9* 32.5* 31.3*  MCV 87.5 87.2 86.1 84.7 90.3 90.7  PLT 305 246 247 240 309 327   Basic Metabolic Panel: Recent Labs  Lab 10/11/18 0416 10/12/18 0226 10/13/18 1126 10/14/18 1847 10/15/18 0743 10/15/18 1029 10/16/18 0807 10/17/18 0207  NA 129* 129* 132* 134* 135  --  139 137  K 4.6 4.5 4.7 5.1 4.9  --  4.1 4.2  CL 94* 96* 100 101 105  --  104 104  CO2 24 24 21*  21* 24  --  30 25  GLUCOSE 150* 144* 174* 155* 161*  --  121* 129*  BUN 18 27* 54* 81* 40*  --  17 14  CREATININE 0.94 1.19 3.63* 5.31* 1.70*  --  0.65 0.60*  CALCIUM 8.7* 8.1* 8.0* 8.2* 8.4*  --  8.5* 8.1*  MG 2.6* 2.0 2.1  --   --  1.8 1.7 2.0  PHOS 5.2* 4.0 4.2  --   --   --   --   --    GFR: Estimated Creatinine Clearance: 83 mL/min (A) (by C-G formula based on SCr of 0.6 mg/dL (L)). Liver Function Tests: No results for input(s): AST, ALT, ALKPHOS, BILITOT, PROT, ALBUMIN in the last 168 hours. No results for input(s): LIPASE, AMYLASE in the last 168 hours. No results for input(s): AMMONIA in the last 168 hours. Coagulation Profile: Recent Labs  Lab 10/14/18 0252  INR 1.3*   Cardiac Enzymes: No results for input(s): CKTOTAL, CKMB, CKMBINDEX, TROPONINI in the last 168 hours. BNP (last 3 results) No results for input(s): PROBNP in the last 8760 hours. HbA1C: No results  for input(s): HGBA1C in the last 72 hours. CBG: Recent Labs  Lab 10/16/18 1547 10/16/18 1935 10/16/18 2259 10/17/18 0354 10/17/18 0736  GLUCAP 124* 130* 139* 98 122*   Lipid Profile: No results for input(s): CHOL, HDL, LDLCALC, TRIG, CHOLHDL, LDLDIRECT in the last 72 hours. Thyroid Function Tests: No results for input(s): TSH, T4TOTAL, FREET4, T3FREE, THYROIDAB in the last 72 hours. Anemia Panel: No results for input(s): VITAMINB12, FOLATE, FERRITIN, TIBC, IRON, RETICCTPCT in the last 72 hours. Sepsis Labs: Recent Labs  Lab 10/11/18 0648  PROCALCITON <0.10  LATICACIDVEN 1.5    Recent Results (from the past 240 hour(s))  Gastrointestinal Panel by PCR , Stool     Status: None   Collection Time: 10/10/18  1:33 AM   Specimen: Stool  Result Value Ref Range Status   Campylobacter species NOT DETECTED NOT DETECTED Final   Plesimonas shigelloides NOT DETECTED NOT DETECTED Final   Salmonella species NOT DETECTED NOT DETECTED Final   Yersinia enterocolitica NOT DETECTED NOT DETECTED Final   Vibrio  species NOT DETECTED NOT DETECTED Final   Vibrio cholerae NOT DETECTED NOT DETECTED Final   Enteroaggregative E coli (EAEC) NOT DETECTED NOT DETECTED Final   Enteropathogenic E coli (EPEC) NOT DETECTED NOT DETECTED Final   Enterotoxigenic E coli (ETEC) NOT DETECTED NOT DETECTED Final   Shiga like toxin producing E coli (STEC) NOT DETECTED NOT DETECTED Final   Shigella/Enteroinvasive E coli (EIEC) NOT DETECTED NOT DETECTED Final   Cryptosporidium NOT DETECTED NOT DETECTED Final   Cyclospora cayetanensis NOT DETECTED NOT DETECTED Final   Entamoeba histolytica NOT DETECTED NOT DETECTED Final   Giardia lamblia NOT DETECTED NOT DETECTED Final   Adenovirus F40/41 NOT DETECTED NOT DETECTED Final   Astrovirus NOT DETECTED NOT DETECTED Final   Norovirus GI/GII NOT DETECTED NOT DETECTED Final   Rotavirus A NOT DETECTED NOT DETECTED Final   Sapovirus (I, II, IV, and V) NOT DETECTED NOT DETECTED Final    Comment: Performed at [facility], [address] [place], [place] [address]  Culture, blood (routine x 2)     Status: Abnormal   Collection Time: 10/11/18  6:47 AM   Specimen: BLOOD LEFT HAND  Result Value Ref Range Status   Specimen Description BLOOD LEFT HAND  Final   Special Requests   Final    BOTTLES DRAWN AEROBIC ONLY Blood Culture results may not be optimal due to an inadequate volume of blood received in culture bottles   Culture  Setup Time   Final    AEROBIC BOTTLE ONLY GRAM NEGATIVE RODS CRITICAL RESULT CALLED TO, READ BACK BY AND VERIFIED WITH: Melven [facility] 10/12/18 0425 JDW Performed at [facility], [address] [place], [place] [address]    Culture PSEUDOMONAS AERUGINOSA (A)  Final   Report Status 10/14/2018 FINAL  Final   Organism ID, Bacteria PSEUDOMONAS AERUGINOSA  Final      Susceptibility   Pseudomonas aeruginosa - MIC*    CEFTAZIDIME 4 SENSITIVE Sensitive     CIPROFLOXACIN <=0.25 SENSITIVE Sensitive     GENTAMICIN <=1 SENSITIVE Sensitive      IMIPENEM <=0.25 SENSITIVE Sensitive     PIP/TAZO 8 SENSITIVE Sensitive     CEFEPIME 2 SENSITIVE Sensitive     * PSEUDOMONAS AERUGINOSA  Blood Culture ID Panel (Reflexed)     Status: Abnormal   Collection Time: 10/11/18  6:47 AM  Result Value Ref Range Status   Enterococcus species NOT DETECTED NOT DETECTED Final  Listeria monocytogenes NOT DETECTED NOT DETECTED Final   Staphylococcus species NOT DETECTED NOT DETECTED Final   Staphylococcus aureus (BCID) NOT DETECTED NOT DETECTED Final   Streptococcus species NOT DETECTED NOT DETECTED Final   Streptococcus agalactiae NOT DETECTED NOT DETECTED Final   Streptococcus pneumoniae NOT DETECTED NOT DETECTED Final   Streptococcus pyogenes NOT DETECTED NOT DETECTED Final   Acinetobacter baumannii NOT DETECTED NOT DETECTED Final   Enterobacteriaceae species NOT DETECTED NOT DETECTED Final   Enterobacter cloacae complex NOT DETECTED NOT DETECTED Final   Escherichia coli NOT DETECTED NOT DETECTED Final   Klebsiella oxytoca NOT DETECTED NOT DETECTED Final   Klebsiella pneumoniae NOT DETECTED NOT DETECTED Final   Proteus species NOT DETECTED NOT DETECTED Final   Serratia marcescens NOT DETECTED NOT DETECTED Final   Carbapenem resistance NOT DETECTED NOT DETECTED Final   Haemophilus influenzae NOT DETECTED NOT DETECTED Final   Neisseria meningitidis NOT DETECTED NOT DETECTED Final   Pseudomonas aeruginosa DETECTED (A) NOT DETECTED Final    Comment: CRITICAL RESULT CALLED TO, READ BACK BY AND VERIFIED WITH: J LEDFORD PHARMD 10/12/18 0425 JDW    Candida albicans NOT DETECTED NOT DETECTED Final   Candida glabrata NOT DETECTED NOT DETECTED Final   Candida krusei NOT DETECTED NOT DETECTED Final   Candida parapsilosis NOT DETECTED NOT DETECTED Final   Candida tropicalis NOT DETECTED NOT DETECTED Final    Comment: Performed at [facility], [address], [place], [place] [address]  Culture, blood (routine x 2)     Status: Abnormal   Collection  Time: 10/11/18  7:45 AM   Specimen: BLOOD  Result Value Ref Range Status   Specimen Description BLOOD RIGHT ANTECUBITAL  Final   Special Requests   Final    BOTTLES DRAWN AEROBIC ONLY Blood Culture adequate volume   Culture  Setup Time   Final    AEROBIC BOTTLE ONLY GRAM NEGATIVE RODS CRITICAL VALUE NOTED.  VALUE IS CONSISTENT WITH PREVIOUSLY REPORTED AND CALLED VALUE.    Culture (A)  Final    PSEUDOMONAS AERUGINOSA SUSCEPTIBILITIES PERFORMED ON PREVIOUS CULTURE WITHIN THE LAST 5 DAYS. Performed at [facility], [address], [place], [place] [address]    Report Status 10/14/2018 FINAL  Final  Culture, respiratory (non-expectorated)     Status: None   Collection Time: 10/13/18  4:26 AM   Specimen: Tracheal Aspirate; Respiratory  Result Value Ref Range Status   Specimen Description TRACHEAL ASPIRATE  Final   Special Requests NONE  Final   Gram Stain NO WBC SEEN NO ORGANISMS SEEN   Final   Culture   Final    Consistent with normal respiratory flora. Performed at [facility], [address], [place], [place] [address]    Report Status 10/15/2018 FINAL  Final  Culture, blood (routine x 2)     Status: None (Preliminary result)   Collection Time: 10/15/18  7:43 AM   Specimen: BLOOD LEFT WRIST  Result Value Ref Range Status   Specimen Description BLOOD LEFT WRIST  Final   Special Requests   Final    BOTTLES DRAWN AEROBIC ONLY Blood Culture adequate volume   Culture   Final    NO GROWTH 2 DAYS Performed at [facility], [address], [place], [place] [address]    Report Status PENDING  Incomplete  Culture, blood (routine x 2)     Status: None (Preliminary result)   Collection Time: 10/15/18  7:51 AM  Specimen: BLOOD  Result Value Ref Range Status   Specimen Description BLOOD LEFT ANTECUBITAL  Final   Special Requests   Final    BOTTLES DRAWN AEROBIC ONLY Blood Culture adequate volume   Culture   Final    NO GROWTH 2 DAYS Performed at [facility], [address] [place], [place] [address]    Report Status PENDING  Incomplete  MRSA PCR Screening     Status: None   Collection Time: 10/15/18  6:17 PM   Specimen: Nasopharyngeal  Result Value Ref Range Status   MRSA by PCR NEGATIVE NEGATIVE Final    Comment:        The GeneXpert MRSA Assay (FDA approved for NASAL specimens only), is one component of a comprehensive MRSA colonization surveillance program. It is not intended to diagnose MRSA infection nor to guide or monitor treatment for MRSA infections. Performed at [facility], [address], [place], [place] [address]          Radiology Studies: No results found.      Scheduled Meds:  amLODipine  5 mg Per Tube Daily   chlorhexidine  15 mL Mouth Rinse BID   Chlorhexidine Gluconate Cloth  6 each Topical Daily   doxazosin  1 mg Per Tube Daily   feeding supplement (JEVITY 1.5 CAL/FIBER)  237 mL Per Tube TID   feeding supplement (OSMOLITE 1.5 CAL)  237 mL Per Tube TID   feeding supplement (PRO-STAT SUGAR FREE 64)  30 mL Per Tube BID   free water  150 mL Per Tube Q4H   heparin injection (subcutaneous)  5,000 Units Subcutaneous Q8H   hydrocortisone   Rectal QID   lidocaine  1 application Urethral Once   mouth rinse  15 mL Mouth Rinse q12n4p   metoprolol tartrate  25 mg Per Tube QID   pantoprazole sodium  40 mg Per Tube Daily   scopolamine  1 patch Transdermal Q72H   simethicone  160 mg Per Tube QID   simvastatin  20 mg Per Tube q1800   Continuous Infusions:  sodium chloride 125 mL/hr at 10/17/18 0626   ceFEPime (MAXIPIME) IV Stopped (10/17/18 1008)   lactated ringers 10 mL/hr at 10/07/18 0859   sodium chloride 500 mL/hr at 10/14/18 2049     LOS: 20 days    Time spent: 40 minutes    Ramiro Harvest, MD [facility]  If 7PM-7AM, please contact night-coverage [email or web address] 10/17/2018, 10:13 AM

## 2018-10-17 NOTE — Progress Notes (Signed)
Physical Therapy Treatment Patient Details Name: Logan Price MRN: [id number] DOB: Feb 10, 1946 Today's Date: 10/17/2018    History of Present Illness Pt is a 73 yo male admitted for planned trach and laryngeal biopsy 7/2. 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 Esophagram 6/26 showed silent gross aspiration of barium. PMH includes: HLD, HTN, hepatitis C, GERD, diverticulitis    PT Comments    Patient progressing with first hallway ambulation since here at [facility] due to refusals.  Currently min A for transfers and needing UE support for balance which is little worse than a week ago.  Goals remain appropriate and updated this session.  Skilled PT continues to be appropriate as well as recommendations for follow up HHPT>   Follow Up Recommendations  Home health PT     Equipment Recommendations  Other (comment)(TBA)    Recommendations for Other Services       Precautions / Restrictions Precautions Precautions: Fall Precaution Comments: increased trach secretions;Gtube    Mobility  Bed Mobility               General bed mobility comments: up in chair  Transfers Overall transfer level: Needs assistance Equipment used: None Transfers: Sit to/from Stand;Stand Pivot Transfers Sit to Stand: Min guard Stand pivot transfers: Min assist       General transfer comment: assist for safety, moving lines so he can get to [facility]  Ambulation/Gait Ambulation/Gait assistance: Min guard Gait Distance (Feet): 400 Feet Assistive device: Rolling walker (2 wheeled) Gait Pattern/deviations: Step-through pattern;Decreased stride length     General Gait Details: assist for balance with RW still unsteady esp with turns and for IV/O2, telemetry   Stairs             Wheelchair Mobility    Modified Rankin (Stroke Patients Only)       Balance Overall balance assessment: Needs assistance   Sitting balance-Leahy  Scale: Good Sitting balance - Comments: leaning down in sitting due to SOB with ambulation     Standing balance-Leahy Scale: Poor Standing balance comment: UE support in standing for hygiene after toileting                            Cognition Arousal/Alertness: Awake/alert Behavior During Therapy: WFL for tasks assessed/performed Overall Cognitive Status: Within Functional Limits for tasks assessed                                        Exercises      General Comments General comments (skin integrity, edema, etc.): SpO2 100% on 28% TC at rest, on 30% with ambulation SpO2 reading 81% with PMSV on, removed during ambulation and with pt's hand off walker recovered to 100%, 92% after ambulation with pt SOB and refused to reapply PMSV      Pertinent Vitals/Pain Pain Assessment: No/denies pain    Home Living                      Prior Function            PT Goals (current goals can now be found in the care plan section) Acute Rehab PT Goals Patient Stated Goal: to take care of himself PT Goal Formulation: With patient Time For Goal Achievement: 10/31/18 Potential to Achieve Goals: Good Progress towards PT  goals: Progressing toward goals    Frequency    Min 3X/week      PT Plan Current plan remains appropriate    Co-evaluation              AM-PAC PT "6 Clicks" Mobility   Outcome Measure  Help needed turning from your back to your side while in a flat bed without using bedrails?: None Help needed moving from lying on your back to sitting on the side of a flat bed without using bedrails?: A Little Help needed moving to and from a bed to a chair (including a wheelchair)?: A Little Help needed standing up from a chair using your arms (e.g., wheelchair or bedside chair)?: A Little Help needed to walk in hospital room?: A Little Help needed climbing 3-5 steps with a railing? : A Lot 6 Click Score: 18    End of Session  Equipment Utilized During Treatment: Oxygen Activity Tolerance: Patient tolerated treatment well Patient left: with call bell/phone within reach;in chair   PT Visit Diagnosis: Other abnormalities of gait and mobility (R26.89);Muscle weakness (generalized) (M62.81)     Time: 1610-9604 PT Time Calculation (min) (ACUTE ONLY): 27 min  Charges:  $Gait Training: 8-22 mins $Therapeutic Activity: 8-22 mins                     Sheran Lawless, PT Acute Rehabilitation Services [phone] 10/17/2018    Elray Mcgregor 10/17/2018, 3:20 PM

## 2018-10-18 ENCOUNTER — Inpatient Hospital Stay (HOSPITAL_COMMUNITY): Payer: Medicare Other

## 2018-10-18 DIAGNOSIS — R0602 Shortness of breath: Secondary | ICD-10-CM

## 2018-10-18 DIAGNOSIS — J90 Pleural effusion, not elsewhere classified: Secondary | ICD-10-CM

## 2018-10-18 LAB — CBC
HCT: 29.2 % — ABNORMAL LOW (ref 39.0–52.0)
Hemoglobin: 9 g/dL — ABNORMAL LOW (ref 13.0–17.0)
MCH: 27.9 pg (ref 26.0–34.0)
MCHC: 30.8 g/dL (ref 30.0–36.0)
MCV: 90.4 fL (ref 80.0–100.0)
Platelets: 328 10*3/uL (ref 150–400)
RBC: 3.23 MIL/uL — ABNORMAL LOW (ref 4.22–5.81)
RDW: 14.2 % (ref 11.5–15.5)
WBC: 11.4 10*3/uL — ABNORMAL HIGH (ref 4.0–10.5)
nRBC: 0 % (ref 0.0–0.2)

## 2018-10-18 LAB — BASIC METABOLIC PANEL
Anion gap: 7 (ref 5–15)
BUN: 13 mg/dL (ref 8–23)
CO2: 24 mmol/L (ref 22–32)
Calcium: 7.7 mg/dL — ABNORMAL LOW (ref 8.9–10.3)
Chloride: 107 mmol/L (ref 98–111)
Creatinine, Ser: 0.58 mg/dL — ABNORMAL LOW (ref 0.61–1.24)
GFR calc Af Amer: >60 mL/min (ref >60)
GFR calc non Af Amer: >60 mL/min (ref >60)
Glucose, Bld: 99 mg/dL (ref 70–99)
Potassium: 3.7 mmol/L (ref 3.5–5.1)
Sodium: 138 mmol/L (ref 135–145)

## 2018-10-18 LAB — GLUCOSE, CAPILLARY
Glucose-Capillary: 107 mg/dL — ABNORMAL HIGH (ref 70–99)
Glucose-Capillary: 131 mg/dL — ABNORMAL HIGH (ref 70–99)
Glucose-Capillary: 137 mg/dL — ABNORMAL HIGH (ref 70–99)
Glucose-Capillary: 94 mg/dL (ref 70–99)
Glucose-Capillary: 95 mg/dL (ref 70–99)

## 2018-10-18 LAB — MAGNESIUM: Magnesium: 1.7 mg/dL (ref 1.7–2.4)

## 2018-10-18 LAB — LACTATE DEHYDROGENASE: LDH: 200 U/L — ABNORMAL HIGH (ref 98–192)

## 2018-10-18 MED ORDER — VANCOMYCIN HCL 10 G IV SOLR
1250.0000 mg | Freq: Once | INTRAVENOUS | Status: AC
  Administered 2018-10-18: 1250 mg via INTRAVENOUS
  Filled 2018-10-18: qty 1250

## 2018-10-18 MED ORDER — POTASSIUM CHLORIDE 20 MEQ PO PACK
40.0000 meq | PACK | Freq: Once | ORAL | Status: AC
  Administered 2018-10-18: 40 meq
  Filled 2018-10-18: qty 2

## 2018-10-18 MED ORDER — SODIUM CHLORIDE 0.9 % IV SOLN
2.0000 g | Freq: Three times a day (TID) | INTRAVENOUS | Status: AC
  Administered 2018-10-18 – 2018-10-29 (×34): 2 g via INTRAVENOUS
  Filled 2018-10-18 (×37): qty 2

## 2018-10-18 MED ORDER — VANCOMYCIN HCL IN DEXTROSE 750-5 MG/150ML-% IV SOLN
750.0000 mg | Freq: Two times a day (BID) | INTRAVENOUS | Status: DC
  Administered 2018-10-19 – 2018-10-20 (×3): 750 mg via INTRAVENOUS
  Filled 2018-10-18 (×4): qty 150

## 2018-10-18 MED ORDER — SODIUM CHLORIDE 0.9 % IV SOLN
500.0000 mg | INTRAVENOUS | Status: AC
  Administered 2018-10-18 – 2018-10-22 (×5): 500 mg via INTRAVENOUS
  Filled 2018-10-18 (×5): qty 500

## 2018-10-18 MED ORDER — MAGNESIUM SULFATE 4 GM/100ML IV SOLN
4.0000 g | Freq: Once | INTRAVENOUS | Status: AC
  Administered 2018-10-18: 4 g via INTRAVENOUS
  Filled 2018-10-18: qty 100

## 2018-10-18 MED ORDER — FUROSEMIDE 10 MG/ML IJ SOLN: 40.0000 mg | Freq: Once | INTRAMUSCULAR | Status: DC

## 2018-10-18 NOTE — Progress Notes (Addendum)
PROGRESS NOTE    Logan Price  KZS:[id number] DOB: Feb 20, 1946 DOA: 09/27/2018 PCP: [name], FNP    Brief Narrative:  73 year old with history of GERD, essential hypertension, hyperlipidemia who recently diagnosed neck mass concerning for malignancy was admitted to the hospital.  Overnight patient went into SVT therefore medical team was consulted.  Patient was given adenosine x1 which converted back to normal sinus rhythm.  G-tube placed by surgery 7/13, tolerating tube feeds.  Metoprolol was started via PEG tube.  Hospitals course was later on complicated by Pseudomonas bacteremia secondary to infected Port-A-Cath.  Patient was transferred to medicine service.  Infectious disease team were consulted who recommended removing Port-A-Cath.    Assessment & Plan:   Principal Problem:   Squamous cell carcinoma of glottis (HCC) Active Problems:   Laryngeal mass   Goals of care, counseling/discussion   Dysphagia   Loss of weight   PSVT (paroxysmal supraventricular tachycardia) (HCC)   Protein-calorie malnutrition, severe   Bacteremia   Leukocytosis   Normocytic anemia   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HCC)  Pseudomonas bacteremia -Likely source was Port-A-Cath which was removed on 10/14/2018.    Patient seen in consultation by ID who are recommending continuation of IV cefepime for total of 2 weeks post Port-A-Cath removal.  Repeat blood cultures pending with no growth to date.  If repeat blood cultures negative, PICC line can be placed in case if he gets transition to skilled nursing facility in the meantime.  Acute kidney injury secondary to acute urinary retention -Patient had over 1 L of urine in his bladder.  Creatinine as high as 5.3, after placing Foley catheter 10/14/2018 his creatinine has trended down to 0.  5 8.  Continue Foley catheter.   Urine output of 1.850 L over the past 24 hours.  Monitor urine output could likely try a voiding trial in a few days.  Follow for now.     Paroxysmal supraventricular tachycardia; intermittent and recurrent -??  Etiology.  Patient with narrow complex tachycardia with a regular rate.  Patient received a dose of adenosine now in sinus rhythm with heart rates in the 90s.  TSH within normal limits.  2D echo normal.  Keep potassium > 4.  Keep magnesium > 2.  Continue current dose of metoprolol via G-tube.  Appreciate cardiology input and recommendations.   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  Patient with increasing secretions.   Status post G-tube placement 7/13.    Patient currently on tube feeds.  -Due to patient's increased secretions patient was placed on a scopolamine patch. Patient not on Robinul on MAR.   -Nutrition team is following. -Patient being followed by oncology.  Patient was transferred to [facility] for radiation oncology evaluation and to be started on radiation treatment.  Patient underwent simulation on 10/16/2018.  Per radiation oncology note patient to start radiation treatments on Tuesday, 10/22/2018.  -Trach care per ENT.  Shortness of breath/pleural effusion Patient with complaints of shortness of breath today which he states has been intermittent and worse than post trach placement.  Patient is +8.9 L during this hospitalization.  Current weight is 139.33 pounds.  Chest x-ray ordered. Strict I's and O's.  Daily weights.  Follow.  Patient currently on IV cefepime empirically. Chest x-ray done with concerns for right basilar infiltrate and right pleural effusion.  Will order a thoracentesis, check a sputum Gram stain and culture, check a urine  Legionella antigen, check a urine pneumococcus antigen.  Will broaden antibiotic coverage to include vancomycin and azithromycin.   DVT prophylaxis: Heparin Code Status: Full Family Communication: Updated patient. Disposition Plan: To be determined.   Consultants:    Oncology: Dr. Dion Body 09/30/2018  Interventional radiology: Dr. Ardelle Anton 10/02/2018  [facility] hospitalist: Dr. Julian Reil 10/03/2018  General surgery: Dr. Luisa Hart 10/06/2018  Dental surgery: Dr. Valentino Hue 10/08/2018  Cardiology: Dr. Wyline Mood 10/13/2018  Infectious disease: Dr. Daiva Eves 10/13/2018  Radiation oncology  Procedures:   CT soft tissue neck 09/28/2018  CT chest with contrast 09/29/2018  CT abdomen 10/01/2018  Orthopantogram 10/08/2018  Percutaneous placement of gastrostomy tube 10/04/2018 per Dr. Ardelle Anton interventional radiology  Port-A-Cath removal per interventional radiology 10/14/2018  Antimicrobials:   IV cefepime 10/12/2018 >>>> 10/29/2018  IV Levaquin 10/11/2018>>>>> 10/12/2018   Subjective: Patient sleeping however easily arousable.  Complaining of some intermittent shortness of breath.  Patient denies any chest pain.   Objective: Vitals:   10/18/18 0211 10/18/18 0338 10/18/18 0500 10/18/18 0525  BP:    131/72  Pulse: (!) 113 99  (!) 107  Resp: 20 18    Temp:      TempSrc:      SpO2: 96% 96%  96%  Weight:   63.2 kg   Height:        Intake/Output Summary (Last 24 hours) at 10/18/2018 1010 Last data filed at 10/18/2018 0700 Gross per 24 hour  Intake 1317.95 ml  Output 1850 ml  Net -532.05 ml   Filed Weights   10/15/18 0350 10/16/18 0500 10/18/18 0500  Weight: 69.2 kg 69.3 kg 63.2 kg    Examination:  General exam: Appears calm and comfortable  Respiratory system: Coarse breath sounds.  No wheezing.  Tracheostomy intact with some mucus noted around trach.    Cardiovascular system: S1 & S2 heard, RRR. No JVD, murmurs, rubs, gallops or clicks. No pedal edema. Gastrointestinal system: Abdomen is soft, nontender, nondistended, positive bowel sounds.  No rebound.  No guarding.  Central nervous system: Alert and oriented. No focal neurological deficits. Extremities: Symmetric 5 x 5 power. Skin: No rashes, lesions or ulcers Psychiatry: Judgement and insight appear normal.  Mood & affect appropriate.     Data Reviewed: I have personally reviewed following labs and imaging studies  CBC: Recent Labs  Lab 10/14/18 0252 10/15/18 0743 10/16/18 0807 10/17/18 0207 10/18/18 0417  WBC 11.7* 10.0 9.0 10.1 11.4*  HGB 10.8* 10.3* 10.2* 9.5* 9.0*  HCT 32.8* 30.9* 32.5* 31.3* 29.2*  MCV 86.1 84.7 90.3 90.7 90.4  PLT 247 240 309 327 328   Basic Metabolic Panel: Recent Labs  Lab 10/12/18 0226 10/13/18 1126 10/14/18 1847 10/15/18 0743 10/15/18 1029 10/16/18 0807 10/17/18 0207 10/18/18 0417  NA 129* 132* 134* 135  --  139 137 138  K 4.5 4.7 5.1 4.9  --  4.1 4.2 3.7  CL 96* 100 101 105  --  104 104 107  CO2 24 21* 21* 24  --  30 25 24   GLUCOSE 144* 174* 155* 161*  --  121* 129* 99  BUN 27* 54* 81* 40*  --  17 14 13   CREATININE 1.19 3.63* 5.31* 1.70*  --  0.65 0.60* 0.58*  CALCIUM 8.1* 8.0* 8.2* 8.4*  --  8.5* 8.1* 7.7*  MG 2.0 2.1  --   --  1.8 1.7 2.0 1.7  PHOS 4.0 4.2  --   --   --   --   --   --  GFR: Estimated Creatinine Clearance: 75.7 mL/min (A) (by C-G formula based on SCr of 0.58 mg/dL (L)). Liver Function Tests: No results for input(s): AST, ALT, ALKPHOS, BILITOT, PROT, ALBUMIN in the last 168 hours. No results for input(s): LIPASE, AMYLASE in the last 168 hours. No results for input(s): AMMONIA in the last 168 hours. Coagulation Profile: Recent Labs  Lab 10/14/18 0252  INR 1.3*   Cardiac Enzymes: No results for input(s): CKTOTAL, CKMB, CKMBINDEX, TROPONINI in the last 168 hours. BNP (last 3 results) No results for input(s): PROBNP in the last 8760 hours. HbA1C: No results for input(s): HGBA1C in the last 72 hours. CBG: Recent Labs  Lab 10/17/18 1645 10/17/18 2050 10/18/18 0000 10/18/18 0518 10/18/18 0827  GLUCAP 100* 155* 107* 95 94   Lipid Profile: No results for input(s): CHOL, HDL, LDLCALC, TRIG, CHOLHDL, LDLDIRECT in the last 72 hours. Thyroid Function Tests: No results for input(s): TSH, T4TOTAL, FREET4, T3FREE,  THYROIDAB in the last 72 hours. Anemia Panel: No results for input(s): VITAMINB12, FOLATE, FERRITIN, TIBC, IRON, RETICCTPCT in the last 72 hours. Sepsis Labs: No results for input(s): PROCALCITON, LATICACIDVEN in the last 168 hours.  Recent Results (from the past 240 hour(s))  Gastrointestinal Panel by PCR , Stool     Status: None   Collection Time: 10/10/18  1:33 AM   Specimen: Stool  Result Value Ref Range Status   Campylobacter species NOT DETECTED NOT DETECTED Final   Plesimonas shigelloides NOT DETECTED NOT DETECTED Final   Salmonella species NOT DETECTED NOT DETECTED Final   Yersinia enterocolitica NOT DETECTED NOT DETECTED Final   Vibrio species NOT DETECTED NOT DETECTED Final   Vibrio cholerae NOT DETECTED NOT DETECTED Final   Enteroaggregative E coli (EAEC) NOT DETECTED NOT DETECTED Final   Enteropathogenic E coli (EPEC) NOT DETECTED NOT DETECTED Final   Enterotoxigenic E coli (ETEC) NOT DETECTED NOT DETECTED Final   Shiga like toxin producing E coli (STEC) NOT DETECTED NOT DETECTED Final   Shigella/Enteroinvasive E coli (EIEC) NOT DETECTED NOT DETECTED Final   Cryptosporidium NOT DETECTED NOT DETECTED Final   Cyclospora cayetanensis NOT DETECTED NOT DETECTED Final   Entamoeba histolytica NOT DETECTED NOT DETECTED Final   Giardia lamblia NOT DETECTED NOT DETECTED Final   Adenovirus F40/41 NOT DETECTED NOT DETECTED Final   Astrovirus NOT DETECTED NOT DETECTED Final   Norovirus GI/GII NOT DETECTED NOT DETECTED Final   Rotavirus A NOT DETECTED NOT DETECTED Final   Sapovirus (I, II, IV, and V) NOT DETECTED NOT DETECTED Final    Comment: Performed at [facility], [address] [place], [place] [address]  Culture, blood (routine x 2)     Status: Abnormal   Collection Time: 10/11/18  6:47 AM   Specimen: BLOOD LEFT HAND  Result Value Ref Range Status   Specimen Description BLOOD LEFT HAND  Final   Special Requests   Final    BOTTLES DRAWN AEROBIC ONLY Blood Culture  results may not be optimal due to an inadequate volume of blood received in culture bottles   Culture  Setup Time   Final    AEROBIC BOTTLE ONLY GRAM NEGATIVE RODS CRITICAL RESULT CALLED TO, READ BACK BY AND VERIFIED WITH: Melven Sartorius [facility] 10/12/18 0425 JDW Performed at [facility], [address] [place], [place] [address]    Culture PSEUDOMONAS AERUGINOSA (A)  Final   Report Status 10/14/2018 FINAL  Final   Organism ID, Bacteria PSEUDOMONAS AERUGINOSA  Final  Susceptibility   Pseudomonas aeruginosa - MIC*    CEFTAZIDIME 4 SENSITIVE Sensitive     CIPROFLOXACIN <=0.25 SENSITIVE Sensitive     GENTAMICIN <=1 SENSITIVE Sensitive     IMIPENEM <=0.25 SENSITIVE Sensitive     PIP/TAZO 8 SENSITIVE Sensitive     CEFEPIME 2 SENSITIVE Sensitive     * PSEUDOMONAS AERUGINOSA  Blood Culture ID Panel (Reflexed)     Status: Abnormal   Collection Time: 10/11/18  6:47 AM  Result Value Ref Range Status   Enterococcus species NOT DETECTED NOT DETECTED Final   Listeria monocytogenes NOT DETECTED NOT DETECTED Final   Staphylococcus species NOT DETECTED NOT DETECTED Final   Staphylococcus aureus (BCID) NOT DETECTED NOT DETECTED Final   Streptococcus species NOT DETECTED NOT DETECTED Final   Streptococcus agalactiae NOT DETECTED NOT DETECTED Final   Streptococcus pneumoniae NOT DETECTED NOT DETECTED Final   Streptococcus pyogenes NOT DETECTED NOT DETECTED Final   Acinetobacter baumannii NOT DETECTED NOT DETECTED Final   Enterobacteriaceae species NOT DETECTED NOT DETECTED Final   Enterobacter cloacae complex NOT DETECTED NOT DETECTED Final   Escherichia coli NOT DETECTED NOT DETECTED Final   Klebsiella oxytoca NOT DETECTED NOT DETECTED Final   Klebsiella pneumoniae NOT DETECTED NOT DETECTED Final   Proteus species NOT DETECTED NOT DETECTED Final   Serratia marcescens NOT DETECTED NOT DETECTED Final   Carbapenem resistance NOT DETECTED NOT DETECTED Final   Haemophilus influenzae NOT DETECTED  NOT DETECTED Final   Neisseria meningitidis NOT DETECTED NOT DETECTED Final   Pseudomonas aeruginosa DETECTED (A) NOT DETECTED Final    Comment: CRITICAL RESULT CALLED TO, READ BACK BY AND VERIFIED WITH: J LEDFORD PHARMD 10/12/18 0425 JDW    Candida albicans NOT DETECTED NOT DETECTED Final   Candida glabrata NOT DETECTED NOT DETECTED Final   Candida krusei NOT DETECTED NOT DETECTED Final   Candida parapsilosis NOT DETECTED NOT DETECTED Final   Candida tropicalis NOT DETECTED NOT DETECTED Final    Comment: Performed at [facility], [address] [place], [place] [address]  Culture, blood (routine x 2)     Status: Abnormal   Collection Time: 10/11/18  7:45 AM   Specimen: BLOOD  Result Value Ref Range Status   Specimen Description BLOOD RIGHT ANTECUBITAL  Final   Special Requests   Final    BOTTLES DRAWN AEROBIC ONLY Blood Culture adequate volume   Culture  Setup Time   Final    AEROBIC BOTTLE ONLY GRAM NEGATIVE RODS CRITICAL VALUE NOTED.  VALUE IS CONSISTENT WITH PREVIOUSLY REPORTED AND CALLED VALUE.    Culture (A)  Final    PSEUDOMONAS AERUGINOSA SUSCEPTIBILITIES PERFORMED ON PREVIOUS CULTURE WITHIN THE LAST 5 DAYS. Performed at [facility], [address] [place], [place] [address]    Report Status 10/14/2018 FINAL  Final  Culture, respiratory (non-expectorated)     Status: None   Collection Time: 10/13/18  4:26 AM   Specimen: Tracheal Aspirate; Respiratory  Result Value Ref Range Status   Specimen Description TRACHEAL ASPIRATE  Final   Special Requests NONE  Final   Gram Stain NO WBC SEEN NO ORGANISMS SEEN   Final   Culture   Final    Consistent with normal respiratory flora. Performed at [facility], [address], [place], [place] [address]    Report Status 10/15/2018 FINAL  Final  Culture, blood (routine x 2)     Status: None (Preliminary result)   Collection Time: 10/15/18  7:43  AM   Specimen: BLOOD LEFT WRIST  Result Value Ref Range Status    Specimen Description BLOOD LEFT WRIST  Final   Special Requests   Final    BOTTLES DRAWN AEROBIC ONLY Blood Culture adequate volume   Culture   Final    NO GROWTH 2 DAYS Performed at [facility], [address] [place], [place] [address]    Report Status PENDING  Incomplete  Culture, blood (routine x 2)     Status: None (Preliminary result)   Collection Time: 10/15/18  7:51 AM   Specimen: BLOOD  Result Value Ref Range Status   Specimen Description BLOOD LEFT ANTECUBITAL  Final   Special Requests   Final    BOTTLES DRAWN AEROBIC ONLY Blood Culture adequate volume   Culture   Final    NO GROWTH 2 DAYS Performed at [facility], [address] [place], [place] [address]    Report Status PENDING  Incomplete  MRSA PCR Screening     Status: None   Collection Time: 10/15/18  6:17 PM   Specimen: Nasopharyngeal  Result Value Ref Range Status   MRSA by PCR NEGATIVE NEGATIVE Final    Comment:        The GeneXpert MRSA Assay (FDA approved for NASAL specimens only), is one component of a comprehensive MRSA colonization surveillance program. It is not intended to diagnose MRSA infection nor to guide or monitor treatment for MRSA infections. Performed at [facility], [address], [place], [place] [address]          Radiology Studies: No results found.      Scheduled Meds: . amLODipine  5 mg Per Tube Daily  . chlorhexidine  15 mL Mouth Rinse BID  . Chlorhexidine Gluconate Cloth  6 each Topical Daily  . doxazosin  1 mg Per Tube Daily  . feeding supplement (JEVITY 1.5 CAL/FIBER)  237 mL Per Tube TID  . feeding supplement (OSMOLITE 1.5 CAL)  237 mL Per Tube TID  . feeding supplement (PRO-STAT SUGAR FREE 64)  30 mL Per Tube BID  . free water  150 mL Per Tube Q4H  . heparin injection (subcutaneous)  5,000 Units Subcutaneous Q8H  . hydrocortisone   Rectal QID  . lidocaine  1 application Urethral Once  . mouth rinse  15 mL Mouth Rinse  q12n4p  . metoprolol tartrate  25 mg Per Tube QID  . pantoprazole sodium  40 mg Per Tube Daily  . scopolamine  1 patch Transdermal Q72H  . simethicone  160 mg Per Tube QID  . simvastatin  20 mg Per Tube q1800   Continuous Infusions: . ceFEPime (MAXIPIME) IV 2 g (10/17/18 2212)  . lactated ringers 10 mL/hr at 10/07/18 0859  . magnesium sulfate bolus IVPB 4 g (10/18/18 0918)  . sodium chloride 500 mL/hr at 10/14/18 2049     LOS: 21 days    Time spent: 40 minutes    Ramiro Harvest, MD [facility]  If 7PM-7AM, please contact night-coverage [email or web address] 10/18/2018, 10:10 AM

## 2018-10-18 NOTE — Progress Notes (Signed)
Pharmacy Antibiotic Note  Logan Price is a 73 y.o. male admitted on 09/27/2018 with neck mass.  Currently treating for Pseudomonal bacteremia with Cefepime. Pharmacy has been consulted for Vancomycin dosing, for new ShOB, R base infiltrate, treat as HAP  Plan: Continue Cefepime 2gm q8, last dose 8/4, for bacteremia, and HAP Vancomycin 1250mg  x1, then 750mg  q12, for AUC 490, using SCr 0.8, TBW  Height: 5\' 9"  (175.3 cm) Weight: 139 lb 5.3 oz (63.2 kg) IBW/kg (Calculated) : 70.7  Temp (24hrs), Avg:98.7 F (37.1 C), Min:98.7 F (37.1 C), Max:98.7 F (37.1 C)  Recent Labs  Lab 10/14/18 0252 10/14/18 1847 10/15/18 0743 10/16/18 0807 10/17/18 0207 10/18/18 0417  WBC 11.7*  --  10.0 9.0 10.1 11.4*  CREATININE  --  5.31* 1.70* 0.65 0.60* 0.58*    Estimated Creatinine Clearance: 75.7 mL/min (A) (by C-G formula based on SCr of 0.58 mg/dL (L)).    No Known Allergies  Antimicrobials this admission: 7/18 Cefepime >>  7/24 Vanc >>   Dose adjustments this admission: 7/24 Cefepime 2gm q12 > q8hr, renal fx improved  Microbiology results: 7/21 BCx: ngtd 7/19 Trach asp: ng-final 7/17 BCx: Pseudomonas aeruginosa, pan-sens 716 GI panel: neg  Thank you for allowing pharmacy to be a part of this patient's care.  Minda Ditto PharmD 10/18/2018 3:32 PM

## 2018-10-18 NOTE — Progress Notes (Signed)
PHARMACY NOTE:  ANTIMICROBIAL RENAL DOSAGE ADJUSTMENT  Current antimicrobial regimen includes a mismatch between antimicrobial dosage and estimated renal function.  As per policy approved by the Pharmacy & Therapeutics and Medical Executive Committees, the antimicrobial dosage will be adjusted accordingly.  Current antimicrobial dosage: cefepime 2g IV q12h   Indication: pseudomonas bacteremia  Renal Function: improved after transfer from [facility]  Estimated Creatinine Clearance: 75.7 mL/min (A) (by C-G formula based on SCr of 0.58 mg/dL (L)).     Antimicrobial dosage has been changed to:  Cefepime 2g IV q8h, last dose 8/4, based on ID recs  Thank you for allowing pharmacy to be a part of this patient's care.  Minda Ditto, [facility] 10/18/2018 12:52 PM

## 2018-10-18 NOTE — Progress Notes (Signed)
OT Cancellation Note  Patient Details Name: Logan Price MRN: [id number] DOB: 1946/01/04   Cancelled Treatment:    Reason Eval/Treat Not Completed: Other (comment). Pt up in chair using phone. Did not want to participate in any activity with OT this pm.  He has refused 4x's this week.  If refusals continue, we will sign off.  Velera Lansdale 10/18/2018, 1:47 PM  Lesle Chris, OTR/L Acute Rehabilitation Services [phone] [facility] pager [phone] office 10/18/2018

## 2018-10-18 NOTE — Progress Notes (Signed)
Called to the Pt room by RN stating that the Pt informed her he was having trouble breathing.  RN tracheally suctioned Pt prior to my arrival but without resolution of Pt complaint.  I tracheally suctioned Pt, cleaned his inner cannula, cleaned his trach site, changed the gauze under trach, and coached the Pt on airway clearance techniques he can use to clear his airway if he feels secretions stuck there.

## 2018-10-19 ENCOUNTER — Inpatient Hospital Stay (HOSPITAL_COMMUNITY): Payer: Medicare Other

## 2018-10-19 DIAGNOSIS — J181 Lobar pneumonia, unspecified organism: Secondary | ICD-10-CM

## 2018-10-19 DIAGNOSIS — R197 Diarrhea, unspecified: Secondary | ICD-10-CM

## 2018-10-19 LAB — COMPREHENSIVE METABOLIC PANEL
ALT: 77 U/L — ABNORMAL HIGH (ref 0–44)
AST: 36 U/L (ref 15–41)
Albumin: 2.1 g/dL — ABNORMAL LOW (ref 3.5–5.0)
Alkaline Phosphatase: 82 U/L (ref 38–126)
Anion gap: 9 (ref 5–15)
BUN: 13 mg/dL (ref 8–23)
CO2: 22 mmol/L (ref 22–32)
Calcium: 8 mg/dL — ABNORMAL LOW (ref 8.9–10.3)
Chloride: 110 mmol/L (ref 98–111)
Creatinine, Ser: 0.59 mg/dL — ABNORMAL LOW (ref 0.61–1.24)
GFR calc Af Amer: >60 mL/min (ref >60)
GFR calc non Af Amer: >60 mL/min (ref >60)
Glucose, Bld: 115 mg/dL — ABNORMAL HIGH (ref 70–99)
Potassium: 4.6 mmol/L (ref 3.5–5.1)
Sodium: 141 mmol/L (ref 135–145)
Total Bilirubin: 0.3 mg/dL (ref 0.3–1.2)
Total Protein: 5.2 g/dL — ABNORMAL LOW (ref 6.5–8.1)

## 2018-10-19 LAB — GLUCOSE, CAPILLARY
Glucose-Capillary: 108 mg/dL — ABNORMAL HIGH (ref 70–99)
Glucose-Capillary: 108 mg/dL — ABNORMAL HIGH (ref 70–99)
Glucose-Capillary: 113 mg/dL — ABNORMAL HIGH (ref 70–99)
Glucose-Capillary: 116 mg/dL — ABNORMAL HIGH (ref 70–99)
Glucose-Capillary: 144 mg/dL — ABNORMAL HIGH (ref 70–99)
Glucose-Capillary: 86 mg/dL (ref 70–99)
Glucose-Capillary: 95 mg/dL (ref 70–99)

## 2018-10-19 LAB — CBC WITH DIFFERENTIAL/PLATELET
Abs Immature Granulocytes: 0.36 10*3/uL — ABNORMAL HIGH (ref 0.00–0.07)
Basophils Absolute: 0.1 10*3/uL (ref 0.0–0.1)
Basophils Relative: 1 %
Eosinophils Absolute: 0.2 10*3/uL (ref 0.0–0.5)
Eosinophils Relative: 2 %
HCT: 31.4 % — ABNORMAL LOW (ref 39.0–52.0)
Hemoglobin: 9.2 g/dL — ABNORMAL LOW (ref 13.0–17.0)
Immature Granulocytes: 3 %
Lymphocytes Relative: 20 %
Lymphs Abs: 2.3 10*3/uL (ref 0.7–4.0)
MCH: 28.2 pg (ref 26.0–34.0)
MCHC: 29.3 g/dL — ABNORMAL LOW (ref 30.0–36.0)
MCV: 96.3 fL (ref 80.0–100.0)
Monocytes Absolute: 0.9 10*3/uL (ref 0.1–1.0)
Monocytes Relative: 8 %
Neutro Abs: 7.7 10*3/uL (ref 1.7–7.7)
Neutrophils Relative %: 66 %
Platelets: 313 10*3/uL (ref 150–400)
RBC: 3.26 MIL/uL — ABNORMAL LOW (ref 4.22–5.81)
RDW: 14.6 % (ref 11.5–15.5)
WBC: 11.5 10*3/uL — ABNORMAL HIGH (ref 4.0–10.5)
nRBC: 0 % (ref 0.0–0.2)

## 2018-10-19 LAB — BODY FLUID CELL COUNT WITH DIFFERENTIAL
Lymphs, Fluid: 19 %
Monocyte-Macrophage-Serous Fluid: 25 % — ABNORMAL LOW (ref 50–90)
Neutrophil Count, Fluid: 56 % — ABNORMAL HIGH (ref 0–25)
Total Nucleated Cell Count, Fluid: 188 cu mm (ref 0–1000)

## 2018-10-19 LAB — PROTEIN, PLEURAL OR PERITONEAL FLUID: Total protein, fluid: <3 g/dL

## 2018-10-19 LAB — LACTATE DEHYDROGENASE, PLEURAL OR PERITONEAL FLUID: LD, Fluid: 98 U/L — ABNORMAL HIGH (ref 3–23)

## 2018-10-19 LAB — C DIFFICILE QUICK SCREEN W PCR REFLEX
C Diff antigen: NEGATIVE
C Diff interpretation: NOT DETECTED
C Diff toxin: NEGATIVE

## 2018-10-19 LAB — AMYLASE, PLEURAL OR PERITONEAL FLUID: Amylase, Fluid: 15 U/L

## 2018-10-19 LAB — ALBUMIN, PLEURAL OR PERITONEAL FLUID: Albumin, Fluid: <1 g/dL

## 2018-10-19 LAB — MAGNESIUM: Magnesium: 2.1 mg/dL (ref 1.7–2.4)

## 2018-10-19 LAB — SARS CORONAVIRUS 2 BY RT PCR (HOSPITAL ORDER, PERFORMED IN ~~LOC~~ HOSPITAL LAB): SARS Coronavirus 2: NEGATIVE

## 2018-10-19 LAB — GLUCOSE, PLEURAL OR PERITONEAL FLUID: Glucose, Fluid: 145 mg/dL

## 2018-10-19 LAB — HIV ANTIBODY (ROUTINE TESTING W REFLEX): HIV Screen 4th Generation wRfx: NONREACTIVE

## 2018-10-19 MED ORDER — LOPERAMIDE HCL 2 MG PO CAPS: 2.0000 mg | ORAL_CAPSULE | ORAL | Status: DC | PRN

## 2018-10-19 MED ORDER — LOPERAMIDE HCL 1 MG/7.5ML PO SUSP
4.0000 mg | Freq: Once | ORAL | Status: AC
  Administered 2018-10-19: 4 mg
  Filled 2018-10-19: qty 30

## 2018-10-19 MED ORDER — SODIUM CHLORIDE 0.9 % IV SOLN
INTRAVENOUS | Status: DC | PRN
  Administered 2018-10-19: 250 mL via INTRAVENOUS
  Administered 2018-10-27: 13:00:00 via INTRAVENOUS

## 2018-10-19 MED ORDER — LOPERAMIDE HCL 1 MG/7.5ML PO SUSP
2.0000 mg | ORAL | Status: DC | PRN
  Filled 2018-10-19 (×3): qty 15

## 2018-10-19 MED ORDER — LIDOCAINE HCL 1 % IJ SOLN
INTRAMUSCULAR | Status: AC
  Filled 2018-10-19: qty 20

## 2018-10-19 NOTE — Progress Notes (Signed)
(  R)chest port removal site looks good. Wound clean, no purulence. Cont Wet to Dry dressing changes daily.  Ascencion Dike PA-C Interventional Radiology 10/19/2018 2:10 PM

## 2018-10-19 NOTE — Progress Notes (Signed)
PROGRESS NOTE    Logan Price  ZOX:[id number] DOB: [birth year] DOA: 09/27/2018 PCP: [name], FNP    Brief Narrative:  73 year old with history of GERD, essential hypertension, hyperlipidemia who recently diagnosed neck mass concerning for malignancy was admitted to the hospital.  Overnight patient went into SVT therefore medical team was consulted.  Patient was given adenosine x1 which converted back to normal sinus rhythm.  G-tube placed by surgery 7/13, tolerating tube feeds.  Metoprolol was started via PEG tube.  Hospitals course was later on complicated by Pseudomonas bacteremia secondary to infected Port-A-Cath.  Patient was transferred to medicine service.  Infectious disease team were consulted who recommended removing Port-A-Cath.    Assessment & Plan:   Principal Problem:   Squamous cell carcinoma of glottis (HCC) Active Problems:   Laryngeal mass   Goals of care, counseling/discussion   Dysphagia   Loss of weight   PSVT (paroxysmal supraventricular tachycardia) (HCC)   Protein-calorie malnutrition, severe   Bacteremia   Leukocytosis   Normocytic anemia   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HCC)   Pleural effusion   Shortness of breath  Pseudomonas bacteremia -Likely source was Port-A-Cath which was removed on 10/14/2018.    Patient seen in consultation by ID who are recommending continuation of IV cefepime for total of 2 weeks post Port-A-Cath removal.  Repeat blood cultures pending with no growth to date.  If repeat blood cultures negative, PICC line can be placed in case if he gets transition to skilled nursing facility in the meantime.  Acute kidney injury secondary to acute urinary retention -Patient had over 1 L of urine in his bladder.  Creatinine as high as 5.3, after placing Foley catheter 10/14/2018 his creatinine has trended down to 0.59.  Continue Foley catheter.   Urine output of 2.1 L over the past 24 hours.  Monitor urine output could likely try a voiding trial in a  few days.  Follow for now.    Paroxysmal supraventricular tachycardia; intermittent and recurrent -??  Etiology.  Patient with narrow complex tachycardia with a regular rate.  Patient received a dose of adenosine now in sinus rhythm with heart rates in the 90s.  TSH within normal limits.  2D echo normal.  Keep potassium > 4.  Keep magnesium > 2.  Continue current dose of metoprolol via G-tube.  Appreciate cardiology input and recommendations.   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  Patient with increasing secretions.   Status post G-tube placement 7/13.    Patient currently on tube feeds.  -Due to patient's increased secretions patient was placed on a scopolamine patch. Patient not on Robinul on MAR.   -Nutrition team is following. -Patient being followed by oncology.  Patient was transferred to [facility] for radiation oncology evaluation and to be started on radiation treatment.  Patient underwent simulation on 10/16/2018.  Per radiation oncology note patient to start radiation treatments on Tuesday, 10/22/2018.  -Trach care per ENT.  Shortness of breath/pleural effusion/pneumonia Patient with complaints of shortness of breath on 10/18/2018, which he states has been intermittent and worse than post trach placement.  Patient is +8.9 L during this hospitalization.  Current weight is 139.33 pounds.  Repeat chest x-ray done 10/18/2018 with concerns for right basilar infiltrate and right pleural effusion.  Ultrasound guided thoracentesis ordered.  Sputum Gram stain and culture pending.  Urine Legionella antigen pending.  Urine pneumococcus antigen pending.  Antibiotics have  been broadened patient currently on IV vancomycin, azithromycin and cefepime.  Strict I's and O's.  Daily weights.  Follow.  Diarrhea Likely secondary to tube feeds.  C. difficile PCR was done which was negative.  Imodium 4  mg x 1.  Imodium 2 mg as needed.   DVT prophylaxis: Heparin Code Status: Full Family Communication: Updated patient. Disposition Plan: To be determined.   Consultants:   Oncology: Dr. Dion Body 09/30/2018  Interventional radiology: Dr. Ardelle Anton 10/02/2018  [facility] hospitalist: Dr. Julian Reil 10/03/2018  General surgery: Dr. Luisa Hart 10/06/2018  Dental surgery: Dr. Valentino Hue 10/08/2018  Cardiology: Dr. Wyline Mood 10/13/2018  Infectious disease: Dr. Daiva Eves 10/13/2018  Radiation oncology  Procedures:   CT soft tissue neck 09/28/2018  CT chest with contrast 09/29/2018  CT abdomen 10/01/2018  Orthopantogram 10/08/2018  Percutaneous placement of gastrostomy tube 10/04/2018 per Dr. Ardelle Anton interventional radiology  Port-A-Cath removal per interventional radiology 10/14/2018  Antimicrobials:   IV cefepime 10/12/2018 >>>> 10/29/2018  IV Levaquin 10/11/2018>>>>> 10/12/2018  IV vancomycin 10/18/2018  IV azithromycin 10/18/2018   Subjective: Patient laying in bed.  More alert today.  States slight improvement with shortness of breath however still with some shortness of breath.  Some improvement with secretions.  Denies any chest pain.  Patient noted to have loose stools and C. difficile was checked which was negative.  Patient with ongoing loose stools since being started on tube feeds.   Objective: Vitals:   10/18/18 1510 10/18/18 2109 10/18/18 2343 10/19/18 0520  BP:  113/65  140/70  Pulse: 97 (!) 101  96  Resp: 20 20  18   Temp:  98.6 F (37 C)  98.8 F (37.1 C)  TempSrc:  Oral  Oral  SpO2: 97% 97% 96% 95%  Weight:    63.6 kg  Height:        Intake/Output Summary (Last 24 hours) at 10/19/2018 1108 Last data filed at 10/19/2018 0959 Gross per 24 hour  Intake 1472.2 ml  Output 2700 ml  Net -1227.8 ml   Filed Weights   10/16/18 0500 10/18/18 0500 10/19/18 0520  Weight: 69.3 kg 63.2 kg 63.6 kg    Examination:  General exam: Appears calm and comfortable  Respiratory system: Coarse breath  sounds right base.  No wheezing.  Tracheostomy intact with some mucus noted around trach.    Cardiovascular system: Regular rate rhythm no murmurs rubs or gallops.  No JVD.  No lower extremity edema.  Gastrointestinal system: Abdomen is nontender, nondistended, soft, positive bowel sounds.  No rebound.  No guarding.  Central nervous system: Alert and oriented. No focal neurological deficits. Extremities: Symmetric 5 x 5 power. Skin: No rashes, lesions or ulcers Psychiatry: Judgement and insight appear normal. Mood & affect appropriate.     Data Reviewed: I have personally reviewed following labs and imaging studies  CBC: Recent Labs  Lab 10/15/18 0743 10/16/18 0807 10/17/18 0207 10/18/18 0417 10/19/18 0516  WBC 10.0 9.0 10.1 11.4* 11.5*  NEUTROABS  --   --   --   --  7.7  HGB 10.3* 10.2* 9.5* 9.0* 9.2*  HCT 30.9* 32.5* 31.3* 29.2* 31.4*  MCV 84.7 90.3 90.7 90.4 96.3  PLT 240 309 327 328 313   Basic Metabolic Panel: Recent Labs  Lab 10/13/18 1126  10/15/18 0743 10/15/18 1029 10/16/18 0807 10/17/18 0207 10/18/18 0417 10/19/18 0516  NA 132*   < > 135  --  139 137 138 141  K 4.7   < > 4.9  --  4.1 4.2 3.7  4.6  CL 100   < > 105  --  104 104 107 110  CO2 21*   < > 24  --  30 25 24 22   GLUCOSE 174*   < > 161*  --  121* 129* 99 115*  BUN 54*   < > 40*  --  17 14 13 13   CREATININE 3.63*   < > 1.70*  --  0.65 0.60* 0.58* 0.59*  CALCIUM 8.0*   < > 8.4*  --  8.5* 8.1* 7.7* 8.0*  MG 2.1  --   --  1.8 1.7 2.0 1.7 2.1  PHOS 4.2  --   --   --   --   --   --   --    < > = values in this interval not displayed.   GFR: Estimated Creatinine Clearance: 76.2 mL/min (A) (by C-G formula based on SCr of 0.59 mg/dL (L)). Liver Function Tests: Recent Labs  Lab 10/19/18 0516  AST 36  ALT 77*  ALKPHOS 82  BILITOT 0.3  PROT 5.2*  ALBUMIN 2.1*   No results for input(s): LIPASE, AMYLASE in the last 168 hours. No results for input(s): AMMONIA in the last 168 hours. Coagulation  Profile: Recent Labs  Lab 10/14/18 0252  INR 1.3*   Cardiac Enzymes: No results for input(s): CKTOTAL, CKMB, CKMBINDEX, TROPONINI in the last 168 hours. BNP (last 3 results) No results for input(s): PROBNP in the last 8760 hours. HbA1C: No results for input(s): HGBA1C in the last 72 hours. CBG: Recent Labs  Lab 10/18/18 1128 10/18/18 1714 10/19/18 0156 10/19/18 0517 10/19/18 0732  GLUCAP 137* 131* 108* 95 116*   Lipid Profile: No results for input(s): CHOL, HDL, LDLCALC, TRIG, CHOLHDL, LDLDIRECT in the last 72 hours. Thyroid Function Tests: No results for input(s): TSH, T4TOTAL, FREET4, T3FREE, THYROIDAB in the last 72 hours. Anemia Panel: No results for input(s): VITAMINB12, FOLATE, FERRITIN, TIBC, IRON, RETICCTPCT in the last 72 hours. Sepsis Labs: No results for input(s): PROCALCITON, LATICACIDVEN in the last 168 hours.  Recent Results (from the past 240 hour(s))  Gastrointestinal Panel by PCR , Stool     Status: None   Collection Time: 10/10/18  1:33 AM   Specimen: Stool  Result Value Ref Range Status   Campylobacter species NOT DETECTED NOT DETECTED Final   Plesimonas shigelloides NOT DETECTED NOT DETECTED Final   Salmonella species NOT DETECTED NOT DETECTED Final   Yersinia enterocolitica NOT DETECTED NOT DETECTED Final   Vibrio species NOT DETECTED NOT DETECTED Final   Vibrio cholerae NOT DETECTED NOT DETECTED Final   Enteroaggregative E coli (EAEC) NOT DETECTED NOT DETECTED Final   Enteropathogenic E coli (EPEC) NOT DETECTED NOT DETECTED Final   Enterotoxigenic E coli (ETEC) NOT DETECTED NOT DETECTED Final   Shiga like toxin producing E coli (STEC) NOT DETECTED NOT DETECTED Final   Shigella/Enteroinvasive E coli (EIEC) NOT DETECTED NOT DETECTED Final   Cryptosporidium NOT DETECTED NOT DETECTED Final   Cyclospora cayetanensis NOT DETECTED NOT DETECTED Final   Entamoeba histolytica NOT DETECTED NOT DETECTED Final   Giardia lamblia NOT DETECTED NOT DETECTED  Final   Adenovirus F40/41 NOT DETECTED NOT DETECTED Final   Astrovirus NOT DETECTED NOT DETECTED Final   Norovirus GI/GII NOT DETECTED NOT DETECTED Final   Rotavirus A NOT DETECTED NOT DETECTED Final   Sapovirus (I, II, IV, and V) NOT DETECTED NOT DETECTED Final    Comment: Performed at [facility], [address] [place],  [place] [address]  Culture, blood (routine x 2)     Status: Abnormal   Collection Time: 10/11/18  6:47 AM   Specimen: BLOOD LEFT HAND  Result Value Ref Range Status   Specimen Description BLOOD LEFT HAND  Final   Special Requests   Final    BOTTLES DRAWN AEROBIC ONLY Blood Culture results may not be optimal due to an inadequate volume of blood received in culture bottles   Culture  Setup Time   Final    AEROBIC BOTTLE ONLY GRAM NEGATIVE RODS CRITICAL RESULT CALLED TO, READ BACK BY AND VERIFIED WITH: Melven Sartorius [facility] 10/12/18 0425 JDW Performed at [facility], [address] [place], [place] [address]    Culture PSEUDOMONAS AERUGINOSA (A)  Final   Report Status 10/14/2018 FINAL  Final   Organism ID, Bacteria PSEUDOMONAS AERUGINOSA  Final      Susceptibility   Pseudomonas aeruginosa - MIC*    CEFTAZIDIME 4 SENSITIVE Sensitive     CIPROFLOXACIN <=0.25 SENSITIVE Sensitive     GENTAMICIN <=1 SENSITIVE Sensitive     IMIPENEM <=0.25 SENSITIVE Sensitive     PIP/TAZO 8 SENSITIVE Sensitive     CEFEPIME 2 SENSITIVE Sensitive     * PSEUDOMONAS AERUGINOSA  Blood Culture ID Panel (Reflexed)     Status: Abnormal   Collection Time: 10/11/18  6:47 AM  Result Value Ref Range Status   Enterococcus species NOT DETECTED NOT DETECTED Final   Listeria monocytogenes NOT DETECTED NOT DETECTED Final   Staphylococcus species NOT DETECTED NOT DETECTED Final   Staphylococcus aureus (BCID) NOT DETECTED NOT DETECTED Final   Streptococcus species NOT DETECTED NOT DETECTED Final   Streptococcus agalactiae NOT DETECTED NOT DETECTED Final   Streptococcus pneumoniae NOT  DETECTED NOT DETECTED Final   Streptococcus pyogenes NOT DETECTED NOT DETECTED Final   Acinetobacter baumannii NOT DETECTED NOT DETECTED Final   Enterobacteriaceae species NOT DETECTED NOT DETECTED Final   Enterobacter cloacae complex NOT DETECTED NOT DETECTED Final   Escherichia coli NOT DETECTED NOT DETECTED Final   Klebsiella oxytoca NOT DETECTED NOT DETECTED Final   Klebsiella pneumoniae NOT DETECTED NOT DETECTED Final   Proteus species NOT DETECTED NOT DETECTED Final   Serratia marcescens NOT DETECTED NOT DETECTED Final   Carbapenem resistance NOT DETECTED NOT DETECTED Final   Haemophilus influenzae NOT DETECTED NOT DETECTED Final   Neisseria meningitidis NOT DETECTED NOT DETECTED Final   Pseudomonas aeruginosa DETECTED (A) NOT DETECTED Final    Comment: CRITICAL RESULT CALLED TO, READ BACK BY AND VERIFIED WITH: J LEDFORD PHARMD 10/12/18 0425 JDW    Candida albicans NOT DETECTED NOT DETECTED Final   Candida glabrata NOT DETECTED NOT DETECTED Final   Candida krusei NOT DETECTED NOT DETECTED Final   Candida parapsilosis NOT DETECTED NOT DETECTED Final   Candida tropicalis NOT DETECTED NOT DETECTED Final    Comment: Performed at [facility], [address] [place], [place] [address]  Culture, blood (routine x 2)     Status: Abnormal   Collection Time: 10/11/18  7:45 AM   Specimen: BLOOD  Result Value Ref Range Status   Specimen Description BLOOD RIGHT ANTECUBITAL  Final   Special Requests   Final    BOTTLES DRAWN AEROBIC ONLY Blood Culture adequate volume   Culture  Setup Time   Final    AEROBIC BOTTLE ONLY GRAM NEGATIVE RODS CRITICAL VALUE NOTED.  VALUE IS CONSISTENT WITH PREVIOUSLY REPORTED AND CALLED VALUE.    Culture (A)  Final    PSEUDOMONAS AERUGINOSA SUSCEPTIBILITIES PERFORMED ON PREVIOUS CULTURE WITHIN THE LAST 5 DAYS. Performed at [facility], [address] [place], [place] [address]    Report Status 10/14/2018 FINAL  Final  Culture, respiratory  (non-expectorated)     Status: None   Collection Time: 10/13/18  4:26 AM   Specimen: Tracheal Aspirate; Respiratory  Result Value Ref Range Status   Specimen Description TRACHEAL ASPIRATE  Final   Special Requests NONE  Final   Gram Stain NO WBC SEEN NO ORGANISMS SEEN   Final   Culture   Final    Consistent with normal respiratory flora. Performed at [facility], [address], [place], [place] [address]    Report Status 10/15/2018 FINAL  Final  Culture, blood (routine x 2)     Status: None (Preliminary result)   Collection Time: 10/15/18  7:43 AM   Specimen: BLOOD LEFT WRIST  Result Value Ref Range Status   Specimen Description BLOOD LEFT WRIST  Final   Special Requests   Final    BOTTLES DRAWN AEROBIC ONLY Blood Culture adequate volume   Culture   Final    NO GROWTH 4 DAYS Performed at [facility], [address], [place], [place] [address]    Report Status PENDING  Incomplete  Culture, blood (routine x 2)     Status: None (Preliminary result)   Collection Time: 10/15/18  7:51 AM   Specimen: BLOOD  Result Value Ref Range Status   Specimen Description BLOOD LEFT ANTECUBITAL  Final   Special Requests   Final    BOTTLES DRAWN AEROBIC ONLY Blood Culture adequate volume   Culture   Final    NO GROWTH 4 DAYS Performed at [facility], [address] [place], [place] [address]    Report Status PENDING  Incomplete  MRSA PCR Screening     Status: None   Collection Time: 10/15/18  6:17 PM   Specimen: Nasopharyngeal  Result Value Ref Range Status   MRSA by PCR NEGATIVE NEGATIVE Final    Comment:        The GeneXpert MRSA Assay (FDA approved for NASAL specimens only), is one component of a comprehensive MRSA colonization surveillance program. It is not intended to diagnose MRSA infection nor to guide or monitor treatment for MRSA infections. Performed at [facility], [address] [place], [place] [address]   SARS Coronavirus 2  (CEPHEID - Performed in [facility] hospital lab), Hosp Order     Status: None   Collection Time: 10/18/18  5:01 PM   Specimen: Nasopharyngeal Swab  Result Value Ref Range Status   SARS Coronavirus 2 NEGATIVE NEGATIVE Final    Comment: (NOTE) If result is NEGATIVE SARS-CoV-2 target nucleic acids are NOT DETECTED. The SARS-CoV-2 RNA is generally detectable in upper and lower  respiratory specimens during the acute phase of infection. The lowest  concentration of SARS-CoV-2 viral copies this assay can detect is 250  copies / mL. A negative result does not preclude SARS-CoV-2 infection  and should not be used as the sole basis for treatment or other  patient management decisions.  A negative result may occur with  improper specimen collection / handling, submission of specimen other  than nasopharyngeal swab, presence of viral mutation(s) within the  areas targeted by this assay, and inadequate number of viral copies  (<250 copies / mL). A negative result must be combined with clinical  observations,  patient history, and epidemiological information. If result is POSITIVE SARS-CoV-2 target nucleic acids are DETECTED. The SARS-CoV-2 RNA is generally detectable in upper and lower  respiratory specimens dur ing the acute phase of infection.  Positive  results are indicative of active infection with SARS-CoV-2.  Clinical  correlation with patient history and other diagnostic information is  necessary to determine patient infection status.  Positive results do  not rule out bacterial infection or co-infection with other viruses. If result is PRESUMPTIVE POSTIVE SARS-CoV-2 nucleic acids MAY BE PRESENT.   A presumptive positive result was obtained on the submitted specimen  and confirmed on repeat testing.  While 2019 novel coronavirus  (SARS-CoV-2) nucleic acids may be present in the submitted sample  additional confirmatory testing may be necessary for epidemiological  and / or clinical  management purposes  to differentiate between  SARS-CoV-2 and other Sarbecovirus currently known to infect humans.  If clinically indicated additional testing with an alternate test  methodology [phone]) is advised. The SARS-CoV-2 RNA is generally  detectable in upper and lower respiratory sp ecimens during the acute  phase of infection. The expected result is Negative. Fact Sheet for Patients:  BoilerBrush.com.cy Fact Sheet for Healthcare Providers: https://pope.com/ This test is not yet approved or cleared by the [place] FDA and has been authorized for detection and/or diagnosis of SARS-CoV-2 by FDA under an Emergency Use Authorization (EUA).  This EUA will remain in effect (meaning this test can be used) for the duration of the COVID-19 declaration under Section 564(b)(1) of the Act, 21 U.S.C. section 360bbb-3(b)(1), unless the authorization is terminated or revoked sooner. Performed at [facility], [address] [place], [place] [address]   C difficile quick scan w PCR reflex     Status: None   Collection Time: 10/18/18  8:32 PM   Specimen: STOOL  Result Value Ref Range Status   C Diff antigen NEGATIVE NEGATIVE Final   C Diff toxin NEGATIVE NEGATIVE Final   C Diff interpretation No C. difficile detected.  Final    Comment: Performed at [facility], [address], [place], [place] [address]         Radiology Studies: Dg Chest Port 1 View  Result Date: 10/18/2018 CLINICAL DATA:  Shortness of breath. EXAM: PORTABLE CHEST 1 VIEW COMPARISON:  10/11/2018. FINDINGS: Interval removal of PowerPort catheter. Tracheostomy tube noted in stable position. Heart size normal. Right base infiltrate with right-sided pleural effusion. No pneumothorax. IMPRESSION: 1. Interval removal of PowerPort catheter. Tracheostomy tube noted with tip in stable position. 2.  Right base infiltrate with  right-sided pleural effusion. Electronically Signed   By: Maisie Fus  Register   On: 10/18/2018 13:35        Scheduled Meds:  amLODipine  5 mg Per Tube Daily   chlorhexidine  15 mL Mouth Rinse BID   Chlorhexidine Gluconate Cloth  6 each Topical Daily   doxazosin  1 mg Per Tube Daily   feeding supplement (JEVITY 1.5 CAL/FIBER)  237 mL Per Tube TID   feeding supplement (OSMOLITE 1.5 CAL)  237 mL Per Tube TID   feeding supplement (PRO-STAT SUGAR FREE 64)  30 mL Per Tube BID   free water  150 mL Per Tube Q4H   heparin injection (subcutaneous)  5,000 Units Subcutaneous Q8H   hydrocortisone   Rectal QID   lidocaine  1 application Urethral Once   loperamide HCl  4 mg Per Tube Once   mouth rinse  15 mL  Mouth Rinse q12n4p   metoprolol tartrate  25 mg Per Tube QID   pantoprazole sodium  40 mg Per Tube Daily   scopolamine  1 patch Transdermal Q72H   simethicone  160 mg Per Tube QID   simvastatin  20 mg Per Tube q1800   Continuous Infusions:  azithromycin Stopped (10/18/18 2022)   ceFEPime (MAXIPIME) IV Stopped (10/19/18 0409)   lactated ringers 10 mL/hr at 10/07/18 0859   sodium chloride 500 mL/hr at 10/14/18 2049   vancomycin 150 mL/hr at 10/19/18 0600     LOS: 22 days    Time spent: 40 minutes    Ramiro Harvest, MD [facility]  If 7PM-7AM, please contact night-coverage [email or web address] 10/19/2018, 11:08 AM

## 2018-10-19 NOTE — Progress Notes (Signed)
Transferred patient down to [place] [facility].

## 2018-10-19 NOTE — Progress Notes (Signed)
PT Cancellation Note  Patient Details Name: Logan Price MRN: [id number] DOB: Feb 21, 1946   Cancelled Treatment:    Reason Eval/Treat Not Completed: Patient at procedure or test/unavailable (pt off floor for [place] per RN. Will follow.)  Philomena Doheny PT 10/19/2018  Acute Rehabilitation Services Pager [phone] Office [phone]

## 2018-10-19 NOTE — Procedures (Signed)
PROCEDURE SUMMARY:  Successful US guided right thoracentesis. Yielded 450 mL of clear pale yellow fluid. Pt tolerated procedure well. No immediate complications.  Specimen was sent for labs. CXR ordered.  EBL < 5 mL  Ascencion Dike PA-C 10/19/2018 3:11 PM

## 2018-10-19 NOTE — Progress Notes (Signed)
Central Tele notified this RN at 1402 that patient HR was 150s, rhythm Afib. Given PRN lopressor - back down to NSR PAC rate 90 after dose. Will continue to monitor and notified MD.

## 2018-10-20 DIAGNOSIS — I472 Ventricular tachycardia: Secondary | ICD-10-CM

## 2018-10-20 LAB — BASIC METABOLIC PANEL
Anion gap: 5 (ref 5–15)
BUN: 13 mg/dL (ref 8–23)
CO2: 29 mmol/L (ref 22–32)
Calcium: 8.2 mg/dL — ABNORMAL LOW (ref 8.9–10.3)
Chloride: 106 mmol/L (ref 98–111)
Creatinine, Ser: 0.69 mg/dL (ref 0.61–1.24)
GFR calc Af Amer: >60 mL/min (ref >60)
GFR calc non Af Amer: >60 mL/min (ref >60)
Glucose, Bld: 109 mg/dL — ABNORMAL HIGH (ref 70–99)
Potassium: 4.2 mmol/L (ref 3.5–5.1)
Sodium: 140 mmol/L (ref 135–145)

## 2018-10-20 LAB — CBC WITH DIFFERENTIAL/PLATELET
Abs Immature Granulocytes: 0.28 10*3/uL — ABNORMAL HIGH (ref 0.00–0.07)
Basophils Absolute: 0.1 10*3/uL (ref 0.0–0.1)
Basophils Relative: 0 %
Eosinophils Absolute: 0.2 10*3/uL (ref 0.0–0.5)
Eosinophils Relative: 1 %
HCT: 28.7 % — ABNORMAL LOW (ref 39.0–52.0)
Hemoglobin: 8.9 g/dL — ABNORMAL LOW (ref 13.0–17.0)
Immature Granulocytes: 2 %
Lymphocytes Relative: 18 %
Lymphs Abs: 2.2 10*3/uL (ref 0.7–4.0)
MCH: 28.3 pg (ref 26.0–34.0)
MCHC: 31 g/dL (ref 30.0–36.0)
MCV: 91.4 fL (ref 80.0–100.0)
Monocytes Absolute: 0.8 10*3/uL (ref 0.1–1.0)
Monocytes Relative: 7 %
Neutro Abs: 8.7 10*3/uL — ABNORMAL HIGH (ref 1.7–7.7)
Neutrophils Relative %: 72 %
Platelets: 329 10*3/uL (ref 150–400)
RBC: 3.14 MIL/uL — ABNORMAL LOW (ref 4.22–5.81)
RDW: 14.7 % (ref 11.5–15.5)
WBC: 12.1 10*3/uL — ABNORMAL HIGH (ref 4.0–10.5)
nRBC: 0 % (ref 0.0–0.2)

## 2018-10-20 LAB — CULTURE, BLOOD (ROUTINE X 2)
Culture: NO GROWTH
Culture: NO GROWTH
Special Requests: ADEQUATE
Special Requests: ADEQUATE

## 2018-10-20 LAB — GLUCOSE, CAPILLARY
Glucose-Capillary: 92 mg/dL (ref 70–99)
Glucose-Capillary: 99 mg/dL (ref 70–99)
Glucose-Capillary: 140 mg/dL — ABNORMAL HIGH (ref 70–99)
Glucose-Capillary: 149 mg/dL — ABNORMAL HIGH (ref 70–99)
Glucose-Capillary: 98 mg/dL (ref 70–99)

## 2018-10-20 LAB — GRAM STAIN

## 2018-10-20 LAB — STREP PNEUMONIAE URINARY ANTIGEN: Strep Pneumo Urinary Antigen: NEGATIVE

## 2018-10-20 LAB — MAGNESIUM: Magnesium: 1.8 mg/dL (ref 1.7–2.4)

## 2018-10-20 MED ORDER — METOPROLOL TARTRATE 25 MG/10 ML ORAL SUSPENSION
50.0000 mg | Freq: Four times a day (QID) | ORAL | Status: DC
  Administered 2018-10-20 – 2018-10-27 (×30): 50 mg
  Filled 2018-10-20 (×37): qty 20

## 2018-10-20 MED ORDER — MAGNESIUM SULFATE 2 GM/50ML IV SOLN
2.0000 g | Freq: Once | INTRAVENOUS | Status: AC
  Administered 2018-10-20: 2 g via INTRAVENOUS
  Filled 2018-10-20: qty 50

## 2018-10-20 NOTE — Progress Notes (Signed)
PROGRESS NOTE    Logan Price  ZOX:[id number] DOB: 1946/01/17 DOA: 09/27/2018 PCP: [name], FNP    Brief Narrative:  73 year old with history of GERD, essential hypertension, hyperlipidemia who recently diagnosed neck mass concerning for malignancy was admitted to the hospital.  Overnight patient went into SVT therefore medical team was consulted.  Patient was given adenosine x1 which converted back to normal sinus rhythm.  G-tube placed by surgery 7/13, tolerating tube feeds.  Metoprolol was started via PEG tube.  Hospitals course was later on complicated by Pseudomonas bacteremia secondary to infected Port-A-Cath.  Patient was transferred to medicine service.  Infectious disease team were consulted who recommended removing Port-A-Cath.    Assessment & Plan:   Principal Problem:   Squamous cell carcinoma of glottis (HCC) Active Problems:   Laryngeal mass   Goals of care, counseling/discussion   Dysphagia   Loss of weight   PSVT (paroxysmal supraventricular tachycardia) (HCC)   Protein-calorie malnutrition, severe   Bacteremia   Leukocytosis   Normocytic anemia   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HCC)   Pleural effusion   Shortness of breath  Pseudomonas bacteremia -Likely source was Port-A-Cath which was removed on 10/14/2018.    Patient seen in consultation by ID who are recommending continuation of IV cefepime for total of 2 weeks post Port-A-Cath removal.  Repeat blood cultures pending with no growth to date.  If repeat blood cultures negative, PICC line can be placed in case if he gets transition to skilled nursing facility in the meantime.  Acute kidney injury secondary to acute urinary retention -Patient had over 1 L of urine in his bladder.  Creatinine as high as 5.3, after placing Foley catheter 10/14/2018 his creatinine has trended down to 0.59.  Continue Foley catheter.   Urine output of 3.250 L over the past 24 hours.  Monitor urine output could likely try a voiding trial in a  few days.  Follow for now.    Paroxysmal supraventricular tachycardia; intermittent and recurrent -??  Etiology.  Patient with narrow complex tachycardia with a regular rate.  Patient received a dose of adenosine now in sinus rhythm with heart rates in the 90s.  TSH within normal limits.  2D echo normal.  Keep potassium > 4.  Keep magnesium > 2.  Increase dose of metoprolol to 50 mg 4 times daily per G-tube as patient noted to have a 25 beat run of nonsustained V. tach overnight.  Patient was seen by cardiology who have signed off.   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  Patient with increasing secretions.   Status post G-tube placement 7/13.    Patient currently on tube feeds.  -Due to patient's increased secretions patient was placed on a scopolamine patch.  Some improvement with secretions.  Patient not on Robinul on MAR.   -Nutrition team is following. -Patient being followed by oncology.  Patient was transferred to [facility] for radiation oncology evaluation and to be started on radiation treatment.  Patient underwent simulation on 10/16/2018.  Per radiation oncology note patient to start radiation treatments on Tuesday, 10/22/2018.  -Trach care per ENT.  Shortness of breath/transudative pleural effusion/pneumonia Patient with complaints of shortness of breath on 10/18/2018, which he stated had been intermittent and worse than post trach placement.  Patient is +8.9 L during this hospitalization as of 10/19/2018.  Patient seems to be auto diuresing with a urine output of 3.250 L  over the past 24 hours..  Current weight is 140.3 pounds on 10/19/2018. Repeat chest x-ray done 10/18/2018 with concerns for right basilar infiltrate and right pleural effusion.  Ultrasound guided thoracentesis ordered and done on 10/19/2018 with 450 cc of clear fluid removed.  By lights criteria transudative effusion.   Cytology pending.  Cultures negative to date.  Patient with some clinical improvement.Marland Kitchen  Sputum Gram stain and culture pending.  Urine Legionella antigen pending.  Urine pneumococcus antigen negative.  Antibiotics have been broadened patient currently on IV vancomycin, azithromycin and cefepime.  Will discontinue IV vancomycin.  Strict I's and O's.  Daily weights.  Follow.  Diarrhea Likely secondary to tube feeds.  C. difficile PCR was done which was negative.  Imodium 4 mg x 1 given on 10/19/2018.  Stools with a more softer consistency today per RN.Marland Kitchen  Imodium 2 mg as needed.  Asymptomatic NSVT  Patient noted per RN to have a 25 beat run of V. tach overnight/early this morning.  Patient asymptomatic.  Will check a magnesium level.  Keep potassium greater than 4.  Keep magnesium greater than 2.  Increase patient's metoprolol to 50 mg 4 times daily per tube.   DVT prophylaxis: Heparin Code Status: Full Family Communication: Updated patient. Disposition Plan: To be determined.   Consultants:   Oncology: Dr. Dion Body 09/30/2018  Interventional radiology: Dr. Ardelle Anton 10/02/2018  [facility] hospitalist: Dr. Julian Reil 10/03/2018  General surgery: Dr. Luisa Hart 10/06/2018  Dental surgery: Dr. Valentino Hue 10/08/2018  Cardiology: Dr. Wyline Mood 10/13/2018  Infectious disease: Dr. Daiva Eves 10/13/2018  Radiation oncology  Procedures:   CT soft tissue neck 09/28/2018  CT chest with contrast 09/29/2018  CT abdomen 10/01/2018  Orthopantogram 10/08/2018  Percutaneous placement of gastrostomy tube 10/04/2018 per Dr. Ardelle Anton interventional radiology  Port-A-Cath removal per interventional radiology 10/14/2018  Ultrasound-guided thoracentesis 10/19/2018 per Brayton El, PA--- 450 cc of clear pale yellow fluid removed.  Antimicrobials:   IV cefepime 10/12/2018 >>>> 10/29/2018  IV Levaquin 10/11/2018>>>>> 10/12/2018  IV vancomycin 10/18/2018>>>>>> 10/20/2018  IV azithromycin 10/18/2018   Subjective: Patient with 25 beat  run of asymptomatic vtach around 208am today.  Patient denies any chest pain.  No shortness of breath.  Patient denies any feelings of palpitations or chest pain overnight.  Patient states some improvement with shortness of breath.  Patient states some improvement with decreasing secretions.  Per RN loose stools more soft in consistency today.   Objective: Vitals:   10/19/18 2322 10/20/18 0423 10/20/18 0432 10/20/18 0919  BP:  (!) 114/55  132/66  Pulse: (!) 101 94 94 79  Resp: 18 20 18    Temp:  98.7 F (37.1 C)    TempSrc:  Oral    SpO2: 97% 96% 96%   Weight:      Height:        Intake/Output Summary (Last 24 hours) at 10/20/2018 1004 Last data filed at 10/20/2018 0945 Gross per 24 hour  Intake 1447.21 ml  Output 3050 ml  Net -1602.79 ml   Filed Weights   10/16/18 0500 10/18/18 0500 10/19/18 0520  Weight: 69.3 kg 63.2 kg 63.6 kg    Examination:  General exam: Appears calm and comfortable  Respiratory system: Coarse diffuse BS.  No wheezing.  Tracheostomy intact.    Cardiovascular system: RRR no murmurs rubs or gallops.  No JVD.  No lower extremity edema.  Gastrointestinal system: Abdomen is soft, nontender, nondistended, positive bowel sounds.  No rebound.  No guarding.   Central nervous system: Alert and oriented.  No focal neurological deficits. Extremities: Symmetric 5 x 5 power. Skin: No rashes, lesions or ulcers Psychiatry: Judgement and insight appear normal. Mood & affect appropriate.     Data Reviewed: I have personally reviewed following labs and imaging studies  CBC: Recent Labs  Lab 10/16/18 0807 10/17/18 0207 10/18/18 0417 10/19/18 0516 10/20/18 0515  WBC 9.0 10.1 11.4* 11.5* 12.1*  NEUTROABS  --   --   --  7.7 8.7*  HGB 10.2* 9.5* 9.0* 9.2* 8.9*  HCT 32.5* 31.3* 29.2* 31.4* 28.7*  MCV 90.3 90.7 90.4 96.3 91.4  PLT 309 327 328 313 329   Basic Metabolic Panel: Recent Labs  Lab 10/13/18 1126  10/15/18 1029 10/16/18 0807 10/17/18 0207  10/18/18 0417 10/19/18 0516 10/20/18 0515  NA 132*   < >  --  139 137 138 141 140  K 4.7   < >  --  4.1 4.2 3.7 4.6 4.2  CL 100   < >  --  104 104 107 110 106  CO2 21*   < >  --  30 25 24 22 29   GLUCOSE 174*   < >  --  121* 129* 99 115* 109*  BUN 54*   < >  --  17 14 13 13 13   CREATININE 3.63*   < >  --  0.65 0.60* 0.58* 0.59* 0.69  CALCIUM 8.0*   < >  --  8.5* 8.1* 7.7* 8.0* 8.2*  MG 2.1  --  1.8 1.7 2.0 1.7 2.1  --   PHOS 4.2  --   --   --   --   --   --   --    < > = values in this interval not displayed.   GFR: Estimated Creatinine Clearance: 76.2 mL/min (by C-G formula based on SCr of 0.69 mg/dL). Liver Function Tests: Recent Labs  Lab 10/19/18 0516  AST 36  ALT 77*  ALKPHOS 82  BILITOT 0.3  PROT 5.2*  ALBUMIN 2.1*   No results for input(s): LIPASE, AMYLASE in the last 168 hours. No results for input(s): AMMONIA in the last 168 hours. Coagulation Profile: Recent Labs  Lab 10/14/18 0252  INR 1.3*   Cardiac Enzymes: No results for input(s): CKTOTAL, CKMB, CKMBINDEX, TROPONINI in the last 168 hours. BNP (last 3 results) No results for input(s): PROBNP in the last 8760 hours. HbA1C: No results for input(s): HGBA1C in the last 72 hours. CBG: Recent Labs  Lab 10/19/18 1634 10/19/18 2018 10/19/18 2350 10/20/18 0421 10/20/18 0757  GLUCAP 113* 108* 86 92 99   Lipid Profile: No results for input(s): CHOL, HDL, LDLCALC, TRIG, CHOLHDL, LDLDIRECT in the last 72 hours. Thyroid Function Tests: No results for input(s): TSH, T4TOTAL, FREET4, T3FREE, THYROIDAB in the last 72 hours. Anemia Panel: No results for input(s): VITAMINB12, FOLATE, FERRITIN, TIBC, IRON, RETICCTPCT in the last 72 hours. Sepsis Labs: No results for input(s): PROCALCITON, LATICACIDVEN in the last 168 hours.  Recent Results (from the past 240 hour(s))  Culture, blood (routine x 2)     Status: Abnormal   Collection Time: 10/11/18  6:47 AM   Specimen: BLOOD LEFT HAND  Result Value Ref Range  Status   Specimen Description BLOOD LEFT HAND  Final   Special Requests   Final    BOTTLES DRAWN AEROBIC ONLY Blood Culture results may not be optimal due to an inadequate volume of blood received in culture bottles   Culture  Setup Time   Final  AEROBIC BOTTLE ONLY GRAM NEGATIVE RODS CRITICAL RESULT CALLED TO, READ BACK BY AND VERIFIED WITH: Melven Sartorius [facility] 10/12/18 0425 JDW Performed at [facility], [address], [place], [place] [address]    Culture PSEUDOMONAS AERUGINOSA (A)  Final   Report Status 10/14/2018 FINAL  Final   Organism ID, Bacteria PSEUDOMONAS AERUGINOSA  Final      Susceptibility   Pseudomonas aeruginosa - MIC*    CEFTAZIDIME 4 SENSITIVE Sensitive     CIPROFLOXACIN <=0.25 SENSITIVE Sensitive     GENTAMICIN <=1 SENSITIVE Sensitive     IMIPENEM <=0.25 SENSITIVE Sensitive     PIP/TAZO 8 SENSITIVE Sensitive     CEFEPIME 2 SENSITIVE Sensitive     * PSEUDOMONAS AERUGINOSA  Blood Culture ID Panel (Reflexed)     Status: Abnormal   Collection Time: 10/11/18  6:47 AM  Result Value Ref Range Status   Enterococcus species NOT DETECTED NOT DETECTED Final   Listeria monocytogenes NOT DETECTED NOT DETECTED Final   Staphylococcus species NOT DETECTED NOT DETECTED Final   Staphylococcus aureus (BCID) NOT DETECTED NOT DETECTED Final   Streptococcus species NOT DETECTED NOT DETECTED Final   Streptococcus agalactiae NOT DETECTED NOT DETECTED Final   Streptococcus pneumoniae NOT DETECTED NOT DETECTED Final   Streptococcus pyogenes NOT DETECTED NOT DETECTED Final   Acinetobacter baumannii NOT DETECTED NOT DETECTED Final   Enterobacteriaceae species NOT DETECTED NOT DETECTED Final   Enterobacter cloacae complex NOT DETECTED NOT DETECTED Final   Escherichia coli NOT DETECTED NOT DETECTED Final   Klebsiella oxytoca NOT DETECTED NOT DETECTED Final   Klebsiella pneumoniae NOT DETECTED NOT DETECTED Final   Proteus species NOT DETECTED NOT DETECTED Final   Serratia marcescens  NOT DETECTED NOT DETECTED Final   Carbapenem resistance NOT DETECTED NOT DETECTED Final   Haemophilus influenzae NOT DETECTED NOT DETECTED Final   Neisseria meningitidis NOT DETECTED NOT DETECTED Final   Pseudomonas aeruginosa DETECTED (A) NOT DETECTED Final    Comment: CRITICAL RESULT CALLED TO, READ BACK BY AND VERIFIED WITH: J LEDFORD PHARMD 10/12/18 0425 JDW    Candida albicans NOT DETECTED NOT DETECTED Final   Candida glabrata NOT DETECTED NOT DETECTED Final   Candida krusei NOT DETECTED NOT DETECTED Final   Candida parapsilosis NOT DETECTED NOT DETECTED Final   Candida tropicalis NOT DETECTED NOT DETECTED Final    Comment: Performed at [facility], [address], [place], [place] [address]  Culture, blood (routine x 2)     Status: Abnormal   Collection Time: 10/11/18  7:45 AM   Specimen: BLOOD  Result Value Ref Range Status   Specimen Description BLOOD RIGHT ANTECUBITAL  Final   Special Requests   Final    BOTTLES DRAWN AEROBIC ONLY Blood Culture adequate volume   Culture  Setup Time   Final    AEROBIC BOTTLE ONLY GRAM NEGATIVE RODS CRITICAL VALUE NOTED.  VALUE IS CONSISTENT WITH PREVIOUSLY REPORTED AND CALLED VALUE.    Culture (A)  Final    PSEUDOMONAS AERUGINOSA SUSCEPTIBILITIES PERFORMED ON PREVIOUS CULTURE WITHIN THE LAST 5 DAYS. Performed at [facility], [address] [place], [place] [address]    Report Status 10/14/2018 FINAL  Final  Culture, respiratory (non-expectorated)     Status: None   Collection Time: 10/13/18  4:26 AM   Specimen: Tracheal Aspirate; Respiratory  Result Value Ref Range Status   Specimen Description TRACHEAL ASPIRATE  Final   Special Requests NONE  Final   Gram Stain  NO WBC SEEN NO ORGANISMS SEEN   Final   Culture   Final    Consistent with normal respiratory flora. Performed at [facility], [address] [place], [place] [address]    Report Status 10/15/2018 FINAL  Final  Culture, blood (routine x 2)      Status: None   Collection Time: 10/15/18  7:43 AM   Specimen: BLOOD LEFT WRIST  Result Value Ref Range Status   Specimen Description BLOOD LEFT WRIST  Final   Special Requests   Final    BOTTLES DRAWN AEROBIC ONLY Blood Culture adequate volume   Culture   Final    NO GROWTH 5 DAYS Performed at [facility], [address] [place], [place] [address]    Report Status 10/20/2018 FINAL  Final  Culture, blood (routine x 2)     Status: None   Collection Time: 10/15/18  7:51 AM   Specimen: BLOOD  Result Value Ref Range Status   Specimen Description BLOOD LEFT ANTECUBITAL  Final   Special Requests   Final    BOTTLES DRAWN AEROBIC ONLY Blood Culture adequate volume   Culture   Final    NO GROWTH 5 DAYS Performed at [facility], [address] [place], [place] [address]    Report Status 10/20/2018 FINAL  Final  MRSA PCR Screening     Status: None   Collection Time: 10/15/18  6:17 PM   Specimen: Nasopharyngeal  Result Value Ref Range Status   MRSA by PCR NEGATIVE NEGATIVE Final    Comment:        The GeneXpert MRSA Assay (FDA approved for NASAL specimens only), is one component of a comprehensive MRSA colonization surveillance program. It is not intended to diagnose MRSA infection nor to guide or monitor treatment for MRSA infections. Performed at [facility], [address], [place], [place] [address]   SARS Coronavirus 2 (CEPHEID - Performed in [facility] hospital lab), Hosp Order     Status: None   Collection Time: 10/18/18  5:01 PM   Specimen: Nasopharyngeal Swab  Result Value Ref Range Status   SARS Coronavirus 2 NEGATIVE NEGATIVE Final    Comment: (NOTE) If result is NEGATIVE SARS-CoV-2 target nucleic acids are NOT DETECTED. The SARS-CoV-2 RNA is generally detectable in upper and lower  respiratory specimens during the acute phase of infection. The lowest  concentration of SARS-CoV-2 viral copies this assay can detect is 250  copies /  mL. A negative result does not preclude SARS-CoV-2 infection  and should not be used as the sole basis for treatment or other  patient management decisions.  A negative result may occur with  improper specimen collection / handling, submission of specimen other  than nasopharyngeal swab, presence of viral mutation(s) within the  areas targeted by this assay, and inadequate number of viral copies  (<250 copies / mL). A negative result must be combined with clinical  observations, patient history, and epidemiological information. If result is POSITIVE SARS-CoV-2 target nucleic acids are DETECTED. The SARS-CoV-2 RNA is generally detectable in upper and lower  respiratory specimens dur ing the acute phase of infection.  Positive  results are indicative of active infection with SARS-CoV-2.  Clinical  correlation with patient history and other diagnostic information is  necessary to determine patient infection status.  Positive results do  not rule out bacterial infection or co-infection with other viruses. If result is PRESUMPTIVE POSTIVE SARS-CoV-2 nucleic acids MAY  BE PRESENT.   A presumptive positive result was obtained on the submitted specimen  and confirmed on repeat testing.  While 2019 novel coronavirus  (SARS-CoV-2) nucleic acids may be present in the submitted sample  additional confirmatory testing may be necessary for epidemiological  and / or clinical management purposes  to differentiate between  SARS-CoV-2 and other Sarbecovirus currently known to infect humans.  If clinically indicated additional testing with an alternate test  methodology [phone]) is advised. The SARS-CoV-2 RNA is generally  detectable in upper and lower respiratory sp ecimens during the acute  phase of infection. The expected result is Negative. Fact Sheet for Patients:  BoilerBrush.com.cy Fact Sheet for Healthcare Providers: https://pope.com/ This test is  not yet approved or cleared by the [place] FDA and has been authorized for detection and/or diagnosis of SARS-CoV-2 by FDA under an Emergency Use Authorization (EUA).  This EUA will remain in effect (meaning this test can be used) for the duration of the COVID-19 declaration under Section 564(b)(1) of the Act, 21 U.S.C. section 360bbb-3(b)(1), unless the authorization is terminated or revoked sooner. Performed at [facility], [address] [place], [place] [address]   C difficile quick scan w PCR reflex     Status: None   Collection Time: 10/18/18  8:32 PM   Specimen: STOOL  Result Value Ref Range Status   C Diff antigen NEGATIVE NEGATIVE Final   C Diff toxin NEGATIVE NEGATIVE Final   C Diff interpretation No C. difficile detected.  Final    Comment: Performed at [facility], [address], [place], [place] [address]  Culture, body fluid-bottle     Status: None (Preliminary result)   Collection Time: 10/19/18  3:20 PM   Specimen: Pleura  Result Value Ref Range Status   Specimen Description PLEURAL  Final   Special Requests NONE  Final   Culture   Final    NO GROWTH < 12 HOURS Performed at [facility], [address], [place], [place] [address]    Report Status PENDING  Incomplete         Radiology Studies: Dg Chest 1 View  Result Date: 10/19/2018 CLINICAL DATA:  S/P right thoracentesis. EXAM: CHEST  1 VIEW COMPARISON:  10/18/2018 FINDINGS: Tracheostomy tube is unchanged. The heart size is normal. Small RIGHT pleural effusion, decreased since prior study. No pneumothorax. Minimal bibasilar atelectasis. IMPRESSION: Decreased RIGHT pleural effusion. No pneumothorax. Electronically Signed   By: Norva Pavlov M.D.   On: 10/19/2018 15:43   Dg Chest Port 1 View  Result Date: 10/18/2018 CLINICAL DATA:  Shortness of breath. EXAM: PORTABLE CHEST 1 VIEW COMPARISON:  10/11/2018. FINDINGS: Interval removal of PowerPort catheter.  Tracheostomy tube noted in stable position. Heart size normal. Right base infiltrate with right-sided pleural effusion. No pneumothorax. IMPRESSION: 1. Interval removal of PowerPort catheter. Tracheostomy tube noted with tip in stable position. 2.  Right base infiltrate with right-sided pleural effusion. Electronically Signed   By: Maisie Fus  Register   On: 10/18/2018 13:35   US Thoracentesis Asp Pleural Space W/img Guide  Result Date: 10/19/2018 INDICATION: Shortness of breath. Right-sided pleural effusion. Request for diagnostic and therapeutic thoracentesis. EXAM: ULTRASOUND GUIDED RIGHT THORACENTESIS MEDICATIONS: None. COMPLICATIONS: None immediate. PROCEDURE: An ultrasound guided thoracentesis was thoroughly discussed with the patient and questions answered. The benefits, risks, alternatives and complications were also discussed. The patient understands and wishes to proceed with the procedure. Written consent was obtained. Ultrasound was performed to localize and  mark an adequate pocket of fluid in the right chest. The area was then prepped and draped in the normal sterile fashion. 1% Lidocaine was used for local anesthesia. Under ultrasound guidance a 6 Fr Safe-T-Centesis catheter was introduced. Thoracentesis was performed. The catheter was removed and a dressing applied. FINDINGS: A total of approximately 450 mL of clear, pale yellow fluid was removed. Samples were sent to the laboratory as requested by the clinical team. IMPRESSION: Successful ultrasound guided right thoracentesis yielding 450 mL of pleural fluid. Read by: Brayton El PA-C Electronically Signed   By: Simonne Come M.D.   On: 10/19/2018 15:29        Scheduled Meds:  amLODipine  5 mg Per Tube Daily   chlorhexidine  15 mL Mouth Rinse BID   doxazosin  1 mg Per Tube Daily   feeding supplement (JEVITY 1.5 CAL/FIBER)  237 mL Per Tube TID   feeding supplement (OSMOLITE 1.5 CAL)  237 mL Per Tube TID   feeding supplement  (PRO-STAT SUGAR FREE 64)  30 mL Per Tube BID   free water  150 mL Per Tube Q4H   heparin injection (subcutaneous)  5,000 Units Subcutaneous Q8H   hydrocortisone   Rectal QID   lidocaine  1 application Urethral Once   mouth rinse  15 mL Mouth Rinse q12n4p   metoprolol tartrate  25 mg Per Tube QID   pantoprazole sodium  40 mg Per Tube Daily   scopolamine  1 patch Transdermal Q72H   simethicone  160 mg Per Tube QID   simvastatin  20 mg Per Tube q1800   Continuous Infusions:  sodium chloride Stopped (10/19/18 1736)   azithromycin Stopped (10/19/18 1715)   ceFEPime (MAXIPIME) IV Stopped (10/20/18 0525)   lactated ringers 10 mL/hr at 10/07/18 0859   sodium chloride 500 mL/hr at 10/14/18 2049   vancomycin 150 mL/hr at 10/20/18 0600     LOS: 23 days    Time spent: 40 minutes    Ramiro Harvest, MD [facility]  If 7PM-7AM, please contact night-coverage [email or web address] 10/20/2018, 10:04 AM

## 2018-10-20 NOTE — Progress Notes (Signed)
Pt c/o nausea throughout this shift- PRN Zofran given x2 with improvement. Pt refused 1600 tube feeds d/t nausea. Denies abdominal distention. Most recent BM with more watery consistency per NT- Imodiom requested from pharmacy. Will ask next shift to give. Pt's wife updated via phone.

## 2018-10-21 ENCOUNTER — Other Ambulatory Visit: Payer: Self-pay | Admitting: Radiation Oncology

## 2018-10-21 DIAGNOSIS — C320 Malignant neoplasm of glottis: Secondary | ICD-10-CM

## 2018-10-21 LAB — BASIC METABOLIC PANEL
Anion gap: 8 (ref 5–15)
BUN: 16 mg/dL (ref 8–23)
CO2: 28 mmol/L (ref 22–32)
Calcium: 8.2 mg/dL — ABNORMAL LOW (ref 8.9–10.3)
Chloride: 105 mmol/L (ref 98–111)
Creatinine, Ser: 0.69 mg/dL (ref 0.61–1.24)
GFR calc Af Amer: >60 mL/min (ref >60)
GFR calc non Af Amer: >60 mL/min (ref >60)
Glucose, Bld: 106 mg/dL — ABNORMAL HIGH (ref 70–99)
Potassium: 4.5 mmol/L (ref 3.5–5.1)
Sodium: 141 mmol/L (ref 135–145)

## 2018-10-21 LAB — CBC WITH DIFFERENTIAL/PLATELET
Abs Immature Granulocytes: 0.16 10*3/uL — ABNORMAL HIGH (ref 0.00–0.07)
Basophils Absolute: 0.1 10*3/uL (ref 0.0–0.1)
Basophils Relative: 1 %
Eosinophils Absolute: 0.1 10*3/uL (ref 0.0–0.5)
Eosinophils Relative: 1 %
HCT: 29.3 % — ABNORMAL LOW (ref 39.0–52.0)
Hemoglobin: 8.7 g/dL — ABNORMAL LOW (ref 13.0–17.0)
Immature Granulocytes: 2 %
Lymphocytes Relative: 18 %
Lymphs Abs: 1.9 10*3/uL (ref 0.7–4.0)
MCH: 27.6 pg (ref 26.0–34.0)
MCHC: 29.7 g/dL — ABNORMAL LOW (ref 30.0–36.0)
MCV: 93 fL (ref 80.0–100.0)
Monocytes Absolute: 0.7 10*3/uL (ref 0.1–1.0)
Monocytes Relative: 7 %
Neutro Abs: 7.7 10*3/uL (ref 1.7–7.7)
Neutrophils Relative %: 71 %
Platelets: 316 10*3/uL (ref 150–400)
RBC: 3.15 MIL/uL — ABNORMAL LOW (ref 4.22–5.81)
RDW: 14.9 % (ref 11.5–15.5)
WBC: 10.7 10*3/uL — ABNORMAL HIGH (ref 4.0–10.5)
nRBC: 0 % (ref 0.0–0.2)

## 2018-10-21 LAB — PH, BODY FLUID: pH, Body Fluid: 7.3

## 2018-10-21 LAB — GLUCOSE, CAPILLARY
Glucose-Capillary: 101 mg/dL — ABNORMAL HIGH (ref 70–99)
Glucose-Capillary: 119 mg/dL — ABNORMAL HIGH (ref 70–99)
Glucose-Capillary: 123 mg/dL — ABNORMAL HIGH (ref 70–99)
Glucose-Capillary: 134 mg/dL — ABNORMAL HIGH (ref 70–99)
Glucose-Capillary: 142 mg/dL — ABNORMAL HIGH (ref 70–99)
Glucose-Capillary: 83 mg/dL (ref 70–99)

## 2018-10-21 LAB — MAGNESIUM: Magnesium: 2.2 mg/dL (ref 1.7–2.4)

## 2018-10-21 MED ORDER — KATE FARMS PEPTIDE 1.5 PO LIQD
325.0000 mL | Freq: Every day | ORAL | Status: DC
  Administered 2018-10-21 – 2018-10-23 (×9): 325 mL via ORAL

## 2018-10-21 MED ORDER — NON FORMULARY: 325.0000 mL | Freq: Every day | Status: DC

## 2018-10-21 NOTE — Progress Notes (Signed)
Radiation Oncology         [phone] ________________________________  Initial inpatient Consultation  Name: Logan Price MRN: [id number]  Date: 10/16/2018  DOB: 28-Nov-1945  WU:[id number], Edilia Bo, FNP  Newman Pies, MD   REFERRING PHYSICIAN: Newman Pies, MD  DIAGNOSIS:    ICD-10-CM   1. 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 male who presented with fullness in his throat and a chronic cough. Subsequently, the patient saw Dr. Suszanne Conners who ordered a CT of his neck which did not explain his symptoms.  The patient had persistent symptoms and ultimately underwent a barium swallow study that showed a large right piriform mass.  Patient had lost weight due to trouble eating.  He also developed difficulty breathing.  He re-presented to Dr. Suszanne Conners in early July.  Laryngoscopy revealed a large ulcerative mass in the right piriform sinus that was obstructing the laryngeal opening He underwent tracheostomy and biopsy.  Histology revealed - INVASIVE AND IN SITU SQUAMOUS CELL CARCINOMA, SEE COMMENT. There are focal basaloid features. Cytokeratin 5/6 is focally positive. p16 is diffusely positive.  CT of the neck on 09/28/2018 revealed massive disease in the neck.  There is a tumor involving the larynx and extending to involve the cervical esophagus while surrounding the right carotid space and narrowing the right internal jugular which remains patent.  The tumor is at least 7.6 cm.  The prevertebral space is involved by the tumor.  There is bilateral malignant adenopathy in the neck. CT of the chest and abdomen were negative for obvious metastatic disease.  I did discuss the  patient extensively at tumor board and one-on-one with medical oncology.  The consensus is that he is a poor candidate for systemic therapy due fighting an infection currently during his hospital stay related to an infected Port-A-Cath site.Marland Kitchen He is getting treated for bacteremia and his hospitalization has been complicated by SVT.  There is no anticipated discharge date at this time.  He also is recovering from acute kidney injury.  The consensus is to proceed with radiotherapy alone.   PREVIOUS RADIATION THERAPY: No  PAST MEDICAL HISTORY:  has a past medical history of Diverticulitis, False positive serological test for hepatitis C (12/13/2016), GERD (gastroesophageal reflux disease), Hepatitis C, HTN (hypertension), and Hyperlipidemia.    PAST SURGICAL HISTORY: Past Surgical History:  Procedure Laterality Date   BIOPSY  05/04/2015   Procedure: BIOPSY;  Surgeon: West Bali, MD;  Location: [facility];  Service: Endoscopy;;  bx's of ileocecal valve    COLONOSCOPY WITH PROPOFOL N/A 05/04/2015   Dr. Darrick Penna: normal appearing ileum with prominent IC valve with tubular adenomas, moderate diverticulosis in sigmoid colon, ascending colon, and retum. Moderate sized internal hemorrhoids. Surveillance in 5 years   ESOPHAGOGASTRODUODENOSCOPY (EGD) WITH PROPOFOL N/A 12/19/2016   Procedure: ESOPHAGOGASTRODUODENOSCOPY (EGD) WITH PROPOFOL;  Surgeon: West Bali, MD;  Location: [facility];  Service: Endoscopy;  Laterality: N/A;  11:30am   FLEXIBLE SIGMOIDOSCOPY N/A 12/10/2015   hemorrhoid banding X 3    HEMORRHOID BANDING N/A 12/10/2015   Procedure: HEMORRHOID BANDING;  Surgeon: West Bali, MD;  Location: [facility];  Service: Endoscopy;  Laterality: N/A;  1:30 PM   IR GASTROSTOMY TUBE MOD SED  10/04/2018   IR IMAGING GUIDED PORT INSERTION  10/04/2018   IR REMOVAL TUN ACCESS W/ PORT W/O FL MOD SED  10/14/2018   LAPAROSCOPIC INSERTION GASTROSTOMY TUBE Left 10/07/2018   Procedure:  LAPAROSCOPIC  GASTROSTOMY TUBE;  Surgeon: Rodman Pickle, MD;  Location: [facility];  Service: General;  Laterality: Left;   MICROLARYNGOSCOPY N/A 09/27/2018   Procedure: MICRO DIRECT LARYNGOSCOPY WITH BIOPSY;  Surgeon: Newman Pies, MD;  Location: [facility] OR;  Service: ENT;  Laterality: N/A;   None to date     As of 04/14/15   POLYPECTOMY  05/04/2015   Procedure: POLYPECTOMY;  Surgeon: West Bali, MD;  Location: [facility];  Service: Endoscopy;;  descending colon polyp, ascending colon polyp   SAVORY DILATION N/A 12/19/2016   Procedure: SAVORY DILATION;  Surgeon: West Bali, MD;  Location: [facility];  Service: Endoscopy;  Laterality: N/A;   TRACHEOSTOMY TUBE PLACEMENT N/A 09/27/2018   Procedure: AWAKE TRACHEOSTOMY;  Surgeon: Newman Pies, MD;  Location: [facility];  Service: ENT;  Laterality: N/A;    FAMILY HISTORY: family history includes Aneurysm (age of onset: 87) in his brother; Cancer in his mother; Hypertension in his brother, father, sister, and sister.  SOCIAL HISTORY:  reports that he quit smoking about 12 years ago. His smoking use included cigarettes. He has a 40.00 pack-year smoking history. He has never used smokeless tobacco. He reports current alcohol use. He reports that he does not use drugs.  ALLERGIES: Patient has no known allergies.  MEDICATIONS:  No current facility-administered medications for this encounter.    Current Outpatient Medications  Medication Sig Dispense Refill   Amino Acids-Protein Hydrolys (FEEDING SUPPLEMENT, PRO-STAT SUGAR FREE 64,) LIQD Place 30 mLs into feeding tube 2 (two) times daily. 887 mL 15   doxazosin (CARDURA) 1 MG tablet Place 1 tablet (1 mg total) into feeding tube daily. 30 tablet 5   metoprolol tartrate (LOPRESSOR) 25 mg/10 mL SUSP Place 5 mLs (12.5 mg total) into feeding tube 2 (two) times daily. 60 mL 5   Nutritional Supplements (FEEDING SUPPLEMENT, OSMOLITE 1.5 CAL,) LIQD Place 1,000 mLs into feeding tube continuous. 10000 mL 15    Facility-Administered Medications Ordered in Other Encounters  Medication Dose Route Frequency Provider Last Rate Last Dose   0.9 %  sodium chloride infusion   Intravenous PRN [name], MD   Stopped at 10/19/18 1736   alum & mag hydroxide-simeth (MAALOX/MYLANTA) 200-200-20 MG/5ML suspension 30 mL  30 mL Oral Q4H PRN [name], MD       amLODipine (NORVASC) tablet 5 mg  5 mg Per Tube Daily [name], MD   5 mg at 10/20/18 0920   azithromycin (ZITHROMAX) 500 mg in sodium chloride 0.9 % 250 mL IVPB  500 mg Intravenous Q24H [name], MD   Stopped at 10/20/18 1900   ceFEPIme (MAXIPIME) 2 g in sodium chloride 0.9 % 100 mL IVPB  2 g Intravenous Q8H Green, Terri L, RPH 200 mL/hr at 10/21/18 0631 2 g at 10/21/18 0631   chlorhexidine (PERIDEX) 0.12 % solution 15 mL  15 mL Mouth Rinse BID [name], MD   15 mL at 10/21/18 0035   doxazosin (CARDURA) tablet 1 mg  1 mg Per Tube Daily [name], MD   1 mg at 10/20/18 4034   feeding supplement (JEVITY 1.5 CAL/FIBER) liquid 237 mL  237 mL  Per Tube TID [name], MD   237 mL at 10/21/18 0711   feeding supplement (OSMOLITE 1.5 CAL) liquid 237 mL  237 mL Per Tube TID [name], MD   237 mL at 10/21/18 0741   feeding supplement (PRO-STAT SUGAR FREE 64) liquid 30 mL  30 mL Per Tube BID [name], MD   30 mL at 10/21/18 0028   free water 150 mL  150 mL Per Tube Q4H [name], MD   150 mL at 10/21/18 0711   heparin injection 5,000 Units  5,000 Units Subcutaneous Q8H [name], MD   5,000 Units at 10/21/18 1610   hydrALAZINE (APRESOLINE) injection 10 mg  10 mg Intravenous Q4H PRN [name], MD       hydrocortisone (ANUSOL-HC) 2.5 % rectal cream 1 application  1 application Topical QID PRN [name], MD       hydrocortisone (ANUSOL-HC) 2.5 % rectal cream   Rectal QID [name], MD       hydrocortisone cream 1 % 1 application  1  application Topical TID PRN [name], MD       ipratropium-albuterol (DUONEB) 0.5-2.5 (3) MG/3ML nebulizer solution 3 mL  3 mL Nebulization Q4H PRN [name], MD       lactated ringers infusion   Intravenous Continuous [name], MD 10 mL/hr at 10/07/18 0859     lidocaine (XYLOCAINE) 2 % jelly 1 application  1 application Urethral Once Newman Pies, MD       lip balm (CARMEX) ointment 1 application  1 application Topical PRN [name], MD       loperamide HCl (IMODIUM) 1 MG/7.5ML suspension 2 mg  2 mg Per Tube PRN [name], MD       loratadine (CLARITIN) tablet 10 mg  10 mg Oral Daily PRN [name], MD       MEDLINE mouth rinse  15 mL Mouth Rinse q12n4p [name], MD   15 mL at 10/19/18 1650   metoprolol tartrate (LOPRESSOR) 25 mg/10 mL oral suspension 50 mg  50 mg Per Tube QID [name], MD   50 mg at 10/21/18 0711   metoprolol tartrate (LOPRESSOR) injection 2.5 mg  2.5 mg Intravenous Q6H PRN [name], MD   2.5 mg at 10/19/18 1415   morphine 2 MG/ML injection 2-4 mg  2-4 mg Intravenous Q2H PRN [name], MD       Muscle Rub CREA 1 application  1 application Topical PRN [name], MD       ondansetron [facility]) injection 4 mg  4 mg Intravenous Q6H PRN [name], MD   4 mg at 10/20/18 1808   pantoprazole sodium (PROTONIX) 40 mg/20 mL oral suspension 40 mg  40 mg Per Tube Daily [name], MD   40 mg at 10/20/18 0921   phenol (CHLORASEPTIC) mouth spray 1 spray  1 spray Mouth/Throat PRN [name], MD   1 spray at 10/18/18 2220   polyethylene glycol (MIRALAX / GLYCOLAX) packet 17 g  17 g Oral Daily PRN [name], MD       polyvinyl alcohol (LIQUIFILM TEARS) 1.4 % ophthalmic solution 1 drop  1 drop Both Eyes PRN [name], MD       promethazine (PHENERGAN) injection 12.5 mg  12.5 mg Intravenous Q6H PRN [name], MD  scopolamine  (TRANSDERM-SCOP) 1 MG/3DAYS 1.5 mg  1 patch Transdermal Q72H [name], MD   1.5 mg at 10/19/18 1001   senna-docusate (Senokot-S) tablet 2 tablet  2 tablet Oral QHS PRN [name], MD       simethicone [facility]) chewable tablet 160 mg  160 mg Per Tube QID [name], MD   160 mg at 10/21/18 0030   simvastatin (ZOCOR) tablet 20 mg  20 mg Per Tube q1800 [name], MD   20 mg at 10/20/18 1801   sodium chloride (OCEAN) 0.65 % nasal spray 1 spray  1 spray Each Nare PRN [name], MD       sodium chloride 0.9 % bolus 500 mL  500 mL Intravenous Once [name], MD 500 mL/hr at 10/14/18 2049      REVIEW OF SYSTEMS:  Notable for that above.   PHYSICAL EXAM:  vitals were not taken for this visit.   General: Alert and oriented, in no acute distress, on gurney HEENT: Head is normocephalic. Janina Mayo is intact with mucus coughed through it frequently  neck: Neck is notable for trach as above  Psychiatric: Judgment and insight are intact. Affect is appropriate.  ECOG = 3  0 - Asymptomatic (Fully active, able to carry on all predisease activities without restriction)  1 - Symptomatic but completely ambulatory (Restricted in physically strenuous activity but ambulatory and able to carry out work of a light or sedentary nature. For example, light housework, office work)  2 - Symptomatic, <50% in bed during the day (Ambulatory and capable of all self care but unable to carry out any work activities. Up and about more than 50% of waking hours)  3 - Symptomatic, >50% in bed, but not bedbound (Capable of only limited self-care, confined to bed or chair 50% or more of waking hours)  4 - Bedbound (Completely disabled. Cannot carry on any self-care. Totally confined to bed or chair)  5 - Death   Santiago Glad MM, Creech RH, Tormey DC, et al. [id number]). "Toxicity and response criteria of the [facility]". Am. Evlyn Clines. Oncol. 5 (6):  649-55   LABORATORY DATA:  Lab Results  Component Value Date   WBC 10.7 (H) 10/21/2018   HGB 8.7 (L) 10/21/2018   HCT 29.3 (L) 10/21/2018   MCV 93.0 10/21/2018   PLT 316 10/21/2018   CMP     Component Value Date/Time   NA 141 10/21/2018 0450   NA 145 (H) 04/25/2018 0919   K 4.5 10/21/2018 0450   CL 105 10/21/2018 0450   CO2 28 10/21/2018 0450   GLUCOSE 106 (H) 10/21/2018 0450   BUN 16 10/21/2018 0450   BUN 12 04/25/2018 0919   CREATININE 0.69 10/21/2018 0450   CREATININE 1.08 10/08/2012 1018   CALCIUM 8.2 (L) 10/21/2018 0450   PROT 5.2 (L) 10/19/2018 0516   PROT 7.2 04/25/2018 0919   ALBUMIN 2.1 (L) 10/19/2018 0516   ALBUMIN 4.6 04/25/2018 0919   AST 36 10/19/2018 0516   ALT 77 (H) 10/19/2018 0516   ALKPHOS 82 10/19/2018 0516   BILITOT 0.3 10/19/2018 0516   BILITOT 0.4 04/25/2018 0919   GFRNONAA >60 10/21/2018 0450   GFRNONAA 72 10/08/2012 1018   GFRAA >60 10/21/2018 0450   GFRAA 83 10/08/2012 1018      Lab Results  Component Value Date   TSH 2.426 10/03/2018     RADIOGRAPHY: Ct Abdomen Wo Contrast  Result Date: 10/01/2018 CLINICAL  DATA:  73 year old male undergoing evaluation for gastrostomy tube placement. EXAM: CT ABDOMEN WITHOUT CONTRAST TECHNIQUE: Multidetector CT imaging of the abdomen was performed following the standard protocol without IV contrast. COMPARISON:  Prior abdominal ultrasound 12/25/2016 FINDINGS: Lower chest: Mild lower lobe bronchial wall thickening. Trace atelectasis versus infiltrate in the dependent portion of the right lower lobe. No suspicious pulmonary nodule or mass. The visualized heart is normal in size. Unremarkable distal thoracic esophagus. Hepatobiliary: Normal hepatic contour and morphology. No discrete hepatic lesion. The gallbladder lumen is filled with high density material likely representing sludge and/or small stones. No biliary ductal dilatation. Pancreas: Unremarkable. No pancreatic ductal dilatation or surrounding  inflammatory changes. Spleen: Normal in size without focal abnormality. Adrenals/Urinary Tract: Normal adrenal glands. Circumscribed low-attenuation lesions within the right kidney are incompletely characterized in the absence of intravenous contrast. However, a sonographically simple cyst was previously identified in the lower pole. No evidence of hydronephrosis or nephrolithiasis. Stomach/Bowel: High-riding transverse colon which lies anterior to the gastric body and antrum. Otherwise, normal gastric anatomy. No focal bowel wall thickening or evidence of obstruction. Vascular/Lymphatic: Limited evaluation in the absence of intravenous contrast. Extensive atherosclerotic vascular calcifications. No aneurysm. Other: No ascites or abdominal wall hernia. Musculoskeletal: No acute fracture or aggressive appearing lytic or blastic osseous lesion. IMPRESSION: 1. High-riding transverse colon which lies anterior to the gastric body and antrum. Percutaneous gastrostomy tube placement may be possible, however will require good visualization of the transverse colon at the time of attempted procedure. Consider administration of barium prior to the procedure. 2.  Aortic Atherosclerosis (ICD10-170.0). 3. Right lower lobe atelectasis versus changes of small volume aspiration. 4. Sludge and/or small stones in the gallbladder lumen. Electronically Signed   By: Malachy Moan M.D.   On: 10/01/2018 14:18   Dg Orthopantogram  Result Date: 10/08/2018 CLINICAL DATA:  Poor dentition. Preoperative for laryngeal carcinoma. EXAM: ORTHOPANTOGRAM/PANORAMIC COMPARISON:  None. FINDINGS: Missing LEFT maxillary teeth from a first bicuspid through distal molar. Missing RIGHT maxillary teeth from second bicuspid through distal molar. Missing RIGHT mandibular teeth from first through distal molar. Missing LEFT mandibular first molar. No periapical lucency to suggest abscess. No definite caries. IMPRESSION: 1. Numerous missing mandibular and  maxillary teeth, as detailed above. 2. No evidence of periapical dental abscess. Electronically Signed   By: Bary Richard M.D.   On: 10/08/2018 13:27   Dg Chest 1 View  Result Date: 10/19/2018 CLINICAL DATA:  S/P right thoracentesis. EXAM: CHEST  1 VIEW COMPARISON:  10/18/2018 FINDINGS: Tracheostomy tube is unchanged. The heart size is normal. Small RIGHT pleural effusion, decreased since prior study. No pneumothorax. Minimal bibasilar atelectasis. IMPRESSION: Decreased RIGHT pleural effusion. No pneumothorax. Electronically Signed   By: Norva Pavlov M.D.   On: 10/19/2018 15:43   Dg Abd 1 View  Result Date: 10/05/2018 CLINICAL DATA:  Is a gastric tube placement. EXAM: ABDOMEN - 1 VIEW COMPARISON:  October 04, 2018 FINDINGS: Nonobstructive bowel gas pattern. Residual contrast throughout the colon and in the appendix. Nonobstructive bowel gas pattern. Enteric catheter overlies the expected location of the gastric cardia with side hole at the expected location of the GE junction. IMPRESSION: Enteric catheter overlies the expected location of the gastric cardia with side hole at the expected location of the GE junction. Electronically Signed   By: Ted Mcalpine M.D.   On: 10/05/2018 15:55   Dg Abd 1 View  Result Date: 10/02/2018 CLINICAL DATA:  Enteric tube placement. EXAM: ABDOMEN - 1 VIEW COMPARISON:  CT  abdomen from yesterday. FINDINGS: Enteric tube tip in the stomach with the proximal side port in the lower esophagus. Nonobstructive bowel gas pattern. No acute osseous abnormality. IMPRESSION: 1. Enteric tube tip in the stomach with proximal side port in the lower esophagus. Recommend advancing 7-8 cm. Electronically Signed   By: Obie Dredge M.D.   On: 10/02/2018 18:59   Ct Soft Tissue Neck W Contrast  Result Date: 09/28/2018 CLINICAL DATA:  73 year old male status post tracheostomy and laryngeal biopsy postoperative day 1. Findings suspicious for squamous cell carcinoma. EXAM: CT NECK  WITH CONTRAST TECHNIQUE: Multidetector CT imaging of the neck was performed using the standard protocol following the bolus administration of intravenous contrast. CONTRAST:  75mL OMNIPAQUE IOHEXOL 300 MG/ML  SOLN COMPARISON:  Neck CT 02/22/2018. FINDINGS: Pharynx and larynx: Tracheostomy has been placed, and there is a small volume of soft tissue gas tracking in the retropharyngeal space, in the bilateral neck, and into the visible mediastinum. The tube appears well positioned. There is debris in the cricoid above the tube. Trace retained secretions or debris in the trachea below the tube. There is an infiltrative and fungating heterogeneously enhancing mass now occupying the larynx, chiefly at and above the glottis, but also possibly infiltrating and involving the cervical esophagus below the glottis (series 3, image 80). There is extension through the right thyroid cartilage and contiguous appearing tumor - probably ex nodal disease - in the right lower neck lateral to the carotid space (series 3, image 64). There is associated narrowing of the right IJ at that level, which remains patent. All told, the poorly marginated hyperenhancing tumor encompasses 35 x 76 x 66 millimeters (AP by transverse by CC). See series 3, image 63, 64, 74, and coronal images 51 through 54. Tumor extends to the AE folds and the mucosa hypopharynx is generally hyperenhancing. The oropharynx and nasopharynx are spared. Negative superior parapharyngeal spaces, the right lower paralaryngeal spaces are abnormal. The retropharyngeal space contains gas from the tracheostomy. Salivary glands: Negative sublingual space. The submandibular glands and parotid glands remain within normal limits. Thyroid: Postoperative changes from tracheostomy, but the posterior right thyroid lobe also appears abutted by tumor on series 3, image 84. Lymph nodes: Extensive right level 3 Aks nodal disease suspected, resulting in a semi contiguous appearance of tumor  from the level 3 station into the larynx. Malignant right level 2A lymph nodes measuring 16 millimeter short axis on series 3, image 47. No level 1 node involvement. No abnormal level 4 nodes are evident. There are small but conspicuous left level 2 B (series 3, image 40) and level IIIb (image 61) lymph nodes which are larger than in 2019, up to 7 millimeters diameter. Vascular: Mass effect on the right internal jugular vein from tumor in the right lower neck, but the vein remains patent. Tumor surrounds the right lower carotid space but the right carotid remains patent with calcified atherosclerosis. Other major vascular structures in the neck and at the skull base are patent. Limited intracranial: Negative. Visualized orbits: Negative. Mastoids and visualized paranasal sinuses: Clear. Skeleton: Hyperenhancing tumor appears to occupy the prevertebral space in the lower cervical spine from C4-C5 through C7-T1, and probably is associated with cervical esophageal involvement. See series 3, image 80 and sagittal image 52. No bone erosion identified. No lytic or suspicious osseous lesion identified elsewhere. Upper chest: Centrilobular emphysema. Stable small right apical nodules on series 9, image 88, and left apically nodule on image 94, which are likely related to lung  scarring. Small volume pneumomediastinum following tracheostomy tube placement. Distal to the tube the trachea appears patent with some retained secretions. No superior mediastinal lymphadenopathy. Calcified aortic atherosclerosis. IMPRESSION: 1. Infiltrative and poorly marginated enhancing tumor in the lower neck eccentric to the right appears to involve the larynx, the adjacent cervical esophagus, and is somewhat contiguous with ex-nodal disease in the right level 3 station, surrounding the right carotid space and narrowing the right IJ which remains patent. - all told tumor encompasses 35 x 76 x 66 mm (AP by transverse by CC) - prevertebral space  involvement suspected C4-C5 through C7-T1 but no underlying bony changes. - malignant right level 2 nodal disease, and small but suspicious contralateral left level 2 and left level 3 nodes. 2. Status post tracheostomy with small volume pneumomediastinum and soft tissue gas in the neck. The airway appears patent below the tracheostomy tube and no adverse features are identified. Electronically Signed   By: Odessa Fleming M.D.   On: 09/28/2018 17:34   Ct Chest W Contrast  Result Date: 09/29/2018 CLINICAL DATA:  Neck mass, advanced neck cancer, status post tracheostomy. EXAM: CT CHEST WITH CONTRAST TECHNIQUE: Multidetector CT imaging of the chest was performed during intravenous contrast administration. CONTRAST:  75mL OMNIPAQUE IOHEXOL 300 MG/ML  SOLN COMPARISON:  09/28/2018 neck CT. FINDINGS: Cardiovascular: Normal heart size. No significant pericardial effusion/thickening. Three-vessel coronary atherosclerosis. Atherosclerotic nonaneurysmal thoracic aorta. Normal caliber pulmonary arteries. No central pulmonary emboli. Mediastinum/Nodes: No discrete thyroid nodules. Normal thoracic esophagus. Infiltrative right neck mass involving larynx and cervical esophagus, as detailed on neck CT from 1 day prior. Subcutaneous emphysema scattered in the lower neck bilaterally. Scattered pneumomediastinum throughout the bilateral paratracheal and paraesophageal regions. No pathologically enlarged axillary, mediastinal or hilar lymph nodes. Lungs/Pleura: No pneumothorax. No pleural effusion. Moderate centrilobular emphysema with diffuse bronchial wall thickening. Tracheostomy tube tip is in the tracheal lumen just below thoracic inlet. No acute consolidative airspace disease or lung masses. Scattered calcified granulomas throughout the medial right lower lobe. A few scattered solid pulmonary nodules in the upper lobes bilaterally, predominantly at the lung apices, largest 6 mm at the apical left upper lobe (series 5/image 17) and 5  mm in the apical right upper lobe (series 5/image 18), all stable since 02/22/2018 CT. Upper abdomen: No acute abnormality. Musculoskeletal: No aggressive appearing focal osseous lesions. Mild symmetric bilateral gynecomastia. Moderate thoracic spondylosis. IMPRESSION: 1. No thoracic adenopathy or other findings suspicious for metastatic disease in the chest. 2. Bilateral upper lobe solid pulmonary nodules, largest 6 mm, all stable since 02/22/2018 neck CT, more likely benign. Suggest follow-up chest CT in 12 months to demonstrate continued stability. 3. Infiltrative right neck mass involving the larynx and cervical esophagus, as detailed on neck CT from 1 day prior. 4. Well-positioned tracheostomy tube. 5. Three-vessel coronary atherosclerosis. Aortic Atherosclerosis (ICD10-I70.0) and Emphysema (ICD10-J43.9). Electronically Signed   By: Delbert Phenix M.D.   On: 09/29/2018 13:41   Ir Gastrostomy Tube Mod Sed  Result Date: 10/04/2018 INDICATION: 73 year old male with dysphagia EXAM: PERC PLACEMENT GASTROSTOMY MEDICATIONS: 2 g Ancef ANESTHESIA/SEDATION: Versed 0.5 mg IV; Fentanyl 25 mcg IV Moderate Sedation Time:  0 The patient was continuously monitored during the procedure by the interventional radiology nurse under my direct supervision. CONTRAST:  None FLUOROSCOPY TIME:  Fluoroscopy Time: 0 minutes 30 seconds (4 mGy). COMPLICATIONS: None immediate. PROCEDURE: Informed written consent was obtained from the patient's family after a thorough discussion of the procedural risks, benefits and alternatives. All questions were addressed. Maximal Sterile  Barrier Technique was utilized including caps, mask, sterile gowns, sterile gloves, sterile drape, hand hygiene and skin antiseptic. A timeout was performed prior to the initiation of the procedure. The epigastrium was prepped with Betadine in a sterile fashion, and a sterile drape was applied covering the operative field. A sterile gown and sterile gloves were used for  the procedure. A 5-French orogastric tube is placed under fluoroscopic guidance. Scout imaging of the abdomen confirms barium within the transverse colon. The stomach was distended with gas. Multiple x-ray performed with insufflation of the stomach. The partially barium filled colon did not migrate caudally with insufflation of the stomach. X-rays were performed. We withdrew from the procedure given the anatomy. Patient tolerated the procedure well and remained hemodynamically stable throughout. No complications were encountered and no significant blood loss encountered. IMPRESSION: Attempt at percutaneous gastrostomy demonstrates insufficient anatomy for image guided placement. The transverse colon did not migrate caudally with insufflation of the stomach. Signed, Yvone Neu. Loreta Ave, DO [facility] Electronically Signed   By: Gilmer Mor D.O.   On: 10/04/2018 14:25   Ir Removal [employer] W/ [place] W/o Fl Mod Sed  Result Date: 10/14/2018 CLINICAL DATA:  History of head and neck squamous cell carcinoma. Status post right upper chest subcutaneous Port-A-Cath placement on 10/04/2018 with catheter access via the right internal jugular vein. Development Pseudomonas bacteremia now requiring Port-A-Cath removal due to suspected catheter seeding/source for infection. EXAM: REMOVAL OF IMPLANTED TUNNELED PORT-A-CATH MEDICATIONS: None PROCEDURE: The right chest Port-A-Cath site was prepped with chlorhexidine. A sterile gown and gloves were worn during the procedure. Local anesthesia was provided with 1% lidocaine. An incision was made overlying the Port-A-Cath with a #15 scalpel. Utilizing sharp and blunt dissection, the Port-A-Cath was removed. The pocked was debrided with gauze soaked in sterile saline. Iodoform gauze was then packed into the wound. A dressing was then applied over the iodoform gauze. FINDINGS: After making an incision, the port pocket was  inspected and demonstrates bloody fluid which is mildly turbulent and felt to be likely infected. Tissue was also indurated and therefore the pocket was left open to heal secondarily. After debridement, iodoform gauze was packed into the port wound. The entire Port-A-Cath and attached catheter were successfully removed. IMPRESSION: Removal of implanted Port-A-Cath utilizing sharp and blunt dissection. The port pocket demonstrates what appears to be likely infected hematoma. After debridement of bloody fluid and clot, the wound was packed with iodoform gauze. Electronically Signed   By: Irish Lack M.D.   On: 10/14/2018 14:34   Dg Chest Port 1 View  Result Date: 10/18/2018 CLINICAL DATA:  Shortness of breath. EXAM: PORTABLE CHEST 1 VIEW COMPARISON:  10/11/2018. FINDINGS: Interval removal of PowerPort catheter. Tracheostomy tube noted in stable position. Heart size normal. Right base infiltrate with right-sided pleural effusion. No pneumothorax. IMPRESSION: 1. Interval removal of PowerPort catheter. Tracheostomy tube noted with tip in stable position. 2.  Right base infiltrate with right-sided pleural effusion. Electronically Signed   By: Maisie Fus  Register   On: 10/18/2018 13:35   Dg Chest Port 1 View  Result Date: 10/11/2018 CLINICAL DATA:  Nausea and vomiting yesterday.  New tracheostomy. EXAM: PORTABLE CHEST 1 VIEW COMPARISON:  Chest CT 09/29/2018 FINDINGS: Tracheostomy tube appears in adequate position. Right IJ Port-A-Cath has tip over the SVC. Lungs are adequately inflated without focal airspace consolidation or effusion. No pneumothorax. Cardiomediastinal silhouette is within normal. Dense radiopaque material/calcifications over the posteromedial right lower lobe as seen on  CT scan. Remainder of the exam is unchanged. IMPRESSION: No acute cardiopulmonary disease. Tracheostomy tube and right IJ Port-A-Cath in adequate position. Electronically Signed   By: Elberta Fortis M.D.   On: 10/11/2018 15:40    Dg Abd Portable 1v  Result Date: 10/11/2018 CLINICAL DATA:  New tracheostomy tube. Nausea and vomiting yesterday. EXAM: PORTABLE ABDOMEN - 1 VIEW COMPARISON:  10/05/2018 FINDINGS: Percutaneous gastrostomy tube projects over the stomach in the left upper quadrant. Bowel gas pattern is nonobstructive. No free peritoneal air. Remainder of the exam is unchanged. IMPRESSION: Nonobstructive bowel gas pattern. Gastrostomy tube projects over the stomach in the left upper quadrant. Electronically Signed   By: Elberta Fortis M.D.   On: 10/11/2018 15:41   Dg Abd Portable 1v  Result Date: 10/03/2018 CLINICAL DATA:  Dysphagia. EXAM: PORTABLE ABDOMEN - 1 VIEW COMPARISON:  Radiograph of October 02, 2018. FINDINGS: The bowel gas pattern is normal. Residual contrast is noted throughout the colon. No radio-opaque calculi or other significant radiographic abnormality are seen. IMPRESSION: No evidence of bowel obstruction or ileus. Electronically Signed   By: Lupita Raider M.D.   On: 10/03/2018 07:52   Dg Abd Portable 1v  Result Date: 10/02/2018 CLINICAL DATA:  Nasogastric tube advancement. EXAM: PORTABLE ABDOMEN - 1 VIEW COMPARISON:  10/02/2018 at 1839 hours FINDINGS: The nasogastric tube has been advanced. Tip now extends further into the stomach with the side hole entering the proximal stomach. Normal bowel gas pattern. IMPRESSION: 1. Advanced nasogastric tube, now fully within the stomach. Electronically Signed   By: Amie Portland M.D.   On: 10/02/2018 20:45   Ir Imaging Guided Port Insertion  Result Date: 10/04/2018 INDICATION: 73 year old male with a history squamous cell carcinoma and dysphagia EXAM: IMPLANTED PORT A CATH PLACEMENT WITH ULTRASOUND AND FLUOROSCOPIC GUIDANCE MEDICATIONS: 2 g Ancef; The antibiotic was administered within an appropriate time interval prior to skin puncture. ANESTHESIA/SEDATION: Moderate (conscious) sedation was employed during this procedure. A total of Versed 1.0 mg and Fentanyl 25 mcg  was administered intravenously. Moderate Sedation Time: 19 minutes. The patient's level of consciousness and vital signs were monitored continuously by radiology nursing throughout the procedure under my direct supervision. FLUOROSCOPY TIME:  0 minutes, 6 seconds (1 mGy) COMPLICATIONS: None PROCEDURE: The procedure, risks, benefits, and alternatives were explained to the patient. Questions regarding the procedure were encouraged and answered. The patient understands and consents to the procedure. Ultrasound survey was performed with images stored and sent to PACs. The right neck and chest was prepped with chlorhexidine, and draped in the usual sterile fashion using maximum barrier technique (cap and mask, sterile gown, sterile gloves, large sterile sheet, hand hygiene and cutaneous antiseptic). Antibiotic prophylaxis was provided with 2.0g Ancef administered IV one hour prior to skin incision. Local anesthesia was attained by infiltration with 1% lidocaine without epinephrine. Ultrasound demonstrated patency of the right internal jugular vein, and this was documented with an image. Under real-time ultrasound guidance, this vein was accessed with a 21 gauge micropuncture needle and image documentation was performed. A small dermatotomy was made at the access site with an 11 scalpel. A 0.018" wire was advanced into the SVC and used to estimate the length of the internal catheter. The access needle exchanged for a 70F micropuncture vascular sheath. The 0.018" wire was then removed and a 0.035" wire advanced into the IVC. An appropriate location for the subcutaneous reservoir was selected below the clavicle and an incision was made through the skin and underlying soft tissues.  The subcutaneous tissues were then dissected using a combination of blunt and sharp surgical technique and a pocket was formed. A single lumen power injectable portacatheter was then tunneled through the subcutaneous tissues from the pocket to the  dermatotomy and the port reservoir placed within the subcutaneous pocket. The venous access site was then serially dilated and a peel away vascular sheath placed over the wire. The wire was removed and the port catheter advanced into position under fluoroscopic guidance. The catheter tip is positioned in the cavoatrial junction. This was documented with a spot image. The portacatheter was then tested and found to flush and aspirate well. The port was flushed with saline followed by 100 units/mL heparinized saline. The pocket was then closed in two layers using first subdermal inverted interrupted absorbable sutures followed by a running subcuticular suture. The epidermis was then sealed with Dermabond. The dermatotomy at the venous access site was also seal with Dermabond. Patient tolerated the procedure well and remained hemodynamically stable throughout. No complications encountered and no significant blood loss encountered IMPRESSION: Status post port catheter placement.  Catheter ready for use. Signed, Yvone Neu. Reyne Dumas, RPVI [facility] Electronically Signed   By: Gilmer Mor D.O.   On: 10/04/2018 14:21   US Thoracentesis Asp Pleural Space W/img Guide  Result Date: 10/19/2018 INDICATION: Shortness of breath. Right-sided pleural effusion. Request for diagnostic and therapeutic thoracentesis. EXAM: ULTRASOUND GUIDED RIGHT THORACENTESIS MEDICATIONS: None. COMPLICATIONS: None immediate. PROCEDURE: An ultrasound guided thoracentesis was thoroughly discussed with the patient and questions answered. The benefits, risks, alternatives and complications were also discussed. The patient understands and wishes to proceed with the procedure. Written consent was obtained. Ultrasound was performed to localize and mark an adequate pocket of fluid in the right chest. The area was then prepped and draped in the normal sterile fashion. 1% Lidocaine was used for  local anesthesia. Under ultrasound guidance a 6 Fr Safe-T-Centesis catheter was introduced. Thoracentesis was performed. The catheter was removed and a dressing applied. FINDINGS: A total of approximately 450 mL of clear, pale yellow fluid was removed. Samples were sent to the laboratory as requested by the clinical team. IMPRESSION: Successful ultrasound guided right thoracentesis yielding 450 mL of pleural fluid. Read by: Brayton El PA-C Electronically Signed   By: Simonne Come M.D.   On: 10/19/2018 15:29      IMPRESSION/PLAN:  This is a delightful patient with head and neck cancer. I recommend radiotherapy for this patient.  He reports that his performance status was excellent before he was hospitalized.  Right now I would estimate his performance status to be a 3.  We spoke about 2 options for definitive radiotherapy.  Oneoption would be to hypofractionated his treatment over 4 weeks while a more standard curative option would be to give him treatment for 7 weeks.  He understands that the chance of cure is very small given the extent of his disease but that we can still try to treat his cancer aggressively in hopes that it is very radiosensitive and that he will have an exceptional response.  He would like to proceed with a more aggressive 7-week standard regimen.  We discussed the potential risks, benefits, and side effects of radiotherapy. We talked in detail about acute and late effects. We discussed that some of the most bothersome acute effects may be mucositis, dysgeusia, salivary changes, skin irritation, hair loss, dehydration, weight loss and fatigue. We talked about late effects which include but are  not necessarily limited to dysphagia, hypothyroidism, nerve injury, spinal cord injury, xerostomia, trismus, and neck edema. No guarantees of treatment were given. A consent form was signed and placed in the patient's medical record. The patient is enthusiastic about proceeding with treatment. I  look forward to participating in the patient's care.    Simulation (treatment planning) will take place today  We also discussed that the treatment of head and neck cancer is a multidisciplinary process to maximize treatment outcomes and quality of life. For this reasons the following referrals have been or will be made:   Medical oncology to discuss chemotherapy which will not be given at this time   Dentistry for dental evaluation, possible extractions in the radiation fields, and /or advice on reducing risk of cavities, osteoradionecrosis, or other oral issues.  We do not have time for any dental procedures due to the aggressiveness of his disease but I will stay off his tooth roots as best I can when designing his treatment   Nutritionist for nutrition support during and after treatment.   Speech language pathology for swallowing and/or speech therapy.   Social work for social support.    In a face-to-face visit lasting 20 minutes, greater than 50% of the time was spent  discussing the patient's condition, treatment, and coordinating the patient's care.   __________________________________________   Lonie Peak, MD

## 2018-10-21 NOTE — Progress Notes (Signed)
PROGRESS NOTE    Logan Price  ZOX:[id number] DOB: [birth year] DOA: 09/27/2018 PCP: [name], FNP    Brief Narrative:  73 year old with history of GERD, essential hypertension, hyperlipidemia who recently diagnosed neck mass concerning for malignancy was admitted to the hospital.  Overnight patient went into SVT therefore medical team was consulted.  Patient was given adenosine x1 which converted back to normal sinus rhythm.  G-tube placed by surgery 7/13, tolerating tube feeds.  Metoprolol was started via PEG tube.  Hospitals course was later on complicated by Pseudomonas bacteremia secondary to infected Port-A-Cath.  Patient was transferred to medicine service.  Infectious disease team were consulted who recommended removing Port-A-Cath.    Assessment & Plan:   Principal Problem:   Squamous cell carcinoma of glottis (HCC) Active Problems:   Laryngeal mass   Goals of care, counseling/discussion   Dysphagia   Loss of weight   PSVT (paroxysmal supraventricular tachycardia) (HCC)   Protein-calorie malnutrition, severe   Bacteremia   Leukocytosis   Normocytic anemia   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HCC)   Pleural effusion   Shortness of breath  Pseudomonas bacteremia -Likely source was Port-A-Cath which was removed on 10/14/2018.    Patient seen in consultation by ID who are recommending continuation of IV cefepime for total of 2 weeks post Port-A-Cath removal.  Repeat blood cultures pending with no growth to date.  If repeat blood cultures negative, PICC line can be placed in case if he gets transition to skilled nursing facility in the meantime.  Acute kidney injury secondary to acute urinary retention -Patient had over 1 L of urine in his bladder.  Creatinine as high as 5.3, after placing Foley catheter 10/14/2018 his creatinine has trended down to 0.69.  Continue Foley catheter.   Urine output of 1.3 L over the past 24 hours.  Voiding trial today.  Post void residual.  Follow for now.     Paroxysmal supraventricular tachycardia; intermittent and recurrent -??  Etiology.  Patient with narrow complex tachycardia with a regular rate.  Patient received a dose of adenosine now in sinus rhythm with heart rates in the 90s.  TSH within normal limits.  2D echo normal.  Keep potassium > 4.  Keep magnesium > 2.  Increased dose of metoprolol to 50 mg 4 times daily per G-tube as patient noted to have a 25 beat run of nonsustained V. tach in the early hours of 10/20/2018.  Patient was seen by cardiology who have signed off.   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  Patient with increasing secretions.   Status post G-tube placement 7/13.    Patient currently on tube feeds.  -Due to patient's increased secretions patient was placed on a scopolamine patch.  Some improvement with secretions.  Patient not on Robinul on MAR.   -Nutrition team is following. -Patient being followed by oncology.  Patient was transferred to [facility] for radiation oncology evaluation and to be started on radiation treatment.  Patient underwent simulation on 10/16/2018.  Per radiation oncology note patient to start radiation treatments on Tuesday, 10/22/2018.  -Trach care per ENT.  Shortness of breath/transudative pleural effusion/pneumonia Patient with complaints of shortness of breath on 10/18/2018, which he stated had been intermittent and worse than post trach placement.  Patient is +8.9 L during this hospitalization as of 10/19/2018.  Patient seems to be auto diuresing with a urine output of 1.3 L over  the past 24 hours..  Current weight is 139 pounds (10/21/2018 ) from 140.3 pounds on 10/19/2018. Repeat chest x-ray done 10/18/2018 with concerns for right basilar infiltrate and right pleural effusion.  Ultrasound guided thoracentesis ordered and done on 10/19/2018 with 450 cc of clear fluid removed.  By lights criteria  transudative effusion.  Cytology pending.  Cultures negative to date.  Patient with some clinical improvement.Marland Kitchen  Sputum Gram stain and culture pending.  Urine Legionella antigen pending.  Urine pneumococcus antigen negative.  Antibiotics have been broadened patient currently on IV vancomycin, azithromycin and cefepime.  IV vancomycin has been discontinued.  Strict I's and O's.  Daily weights.  Follow.  Diarrhea Likely secondary to tube feeds.  C. difficile PCR was done which was negative.  Imodium 4 mg x 1 given on 10/19/2018.  Stools with a more softer consistency today per patient.  We will have dietitian reassess tube feeds due to watery loose stools and nausea.  Continue Imodium as needed.   Asymptomatic NSVT  Patient noted per RN to have a 25 beat run of V. tach overnight/early (10/20/2018).  No further episodes noted.  Patient asymptomatic.  Potassium of 4.5.  Magnesium at 2.2.  Metoprolol dose was increased to 50 mg 4 times daily. 2.  Follow.     DVT prophylaxis: Heparin Code Status: Full Family Communication: Updated patient. Disposition Plan: To be determined.   Consultants:   Oncology: Dr. Dion Body 09/30/2018  Interventional radiology: Dr. Ardelle Anton 10/02/2018  [facility] hospitalist: Dr. Julian Reil 10/03/2018  General surgery: Dr. Luisa Hart 10/06/2018  Dental surgery: Dr. Valentino Hue 10/08/2018  Cardiology: Dr. Wyline Mood 10/13/2018  Infectious disease: Dr. Daiva Eves 10/13/2018  Radiation oncology  Procedures:   CT soft tissue neck 09/28/2018  CT chest with contrast 09/29/2018  CT abdomen 10/01/2018  Orthopantogram 10/08/2018  Percutaneous placement of gastrostomy tube 10/04/2018 per Dr. Ardelle Anton interventional radiology  Port-A-Cath removal per interventional radiology 10/14/2018  Ultrasound-guided thoracentesis 10/19/2018 per Brayton El, PA--- 450 cc of clear pale yellow fluid removed.  Antimicrobials:   IV cefepime 10/12/2018 >>>> 10/29/2018  IV Levaquin 10/11/2018>>>>> 10/12/2018  IV vancomycin  10/18/2018>>>>>> 10/20/2018  IV azithromycin 10/18/2018   Subjective: Patient with no further beats of asymptomatic V. tach.  Patient denies any chest pain.  No shortness of breath.  Patient denies any palpitations.  Patient states shortness of breath improving.  Patient with slight improvement with secretions per patient.  Patient noted to still have loose stools and nausea with tube feeds.  Patient states stool was more soft and less runny today.    Objective: Vitals:   10/21/18 0422 10/21/18 0445 10/21/18 0640 10/21/18 0711  BP: 119/68   119/68  Pulse: 86 90  90  Resp: 16 18    Temp: 98.4 F (36.9 C)     TempSrc:      SpO2: 93% 95%    Weight:   63.3 kg   Height:        Intake/Output Summary (Last 24 hours) at 10/21/2018 1120 Last data filed at 10/21/2018 1037 Gross per 24 hour  Intake 537 ml  Output 1400 ml  Net -863 ml   Filed Weights   10/18/18 0500 10/19/18 0520 10/21/18 0640  Weight: 63.2 kg 63.6 kg 63.3 kg    Examination:  General exam: NAD Respiratory system: Diffuse coarse breath sounds.  No wheezing.  Tracheostomy intact.  Cardiovascular system: Regular rate rhythm no murmurs rubs or gallops.  No JVD.  No lower extremity edema.  Gastrointestinal system: Abdomen is  nontender, nondistended, soft, positive bowel sounds.  No rebound.  No guarding. Central nervous system: Alert and oriented. No focal neurological deficits. Extremities: Symmetric 5 x 5 power. Skin: No rashes, lesions or ulcers Psychiatry: Judgement and insight appear normal. Mood & affect appropriate.     Data Reviewed: I have personally reviewed following labs and imaging studies  CBC: Recent Labs  Lab 10/17/18 0207 10/18/18 0417 10/19/18 0516 10/20/18 0515 10/21/18 0450  WBC 10.1 11.4* 11.5* 12.1* 10.7*  NEUTROABS  --   --  7.7 8.7* 7.7  HGB 9.5* 9.0* 9.2* 8.9* 8.7*  HCT 31.3* 29.2* 31.4* 28.7* 29.3*  MCV 90.7 90.4 96.3 91.4 93.0  PLT 327 328 313 329 316   Basic Metabolic  Panel: Recent Labs  Lab 10/17/18 0207 10/18/18 0417 10/19/18 0516 10/20/18 0510 10/20/18 0515 10/21/18 0450  NA 137 138 141  --  140 141  K 4.2 3.7 4.6  --  4.2 4.5  CL 104 107 110  --  106 105  CO2 25 24 22   --  29 28  GLUCOSE 129* 99 115*  --  109* 106*  BUN 14 13 13   --  13 16  CREATININE 0.60* 0.58* 0.59*  --  0.69 0.69  CALCIUM 8.1* 7.7* 8.0*  --  8.2* 8.2*  MG 2.0 1.7 2.1 1.8  --  2.2   GFR: Estimated Creatinine Clearance: 75.8 mL/min (by C-G formula based on SCr of 0.69 mg/dL). Liver Function Tests: Recent Labs  Lab 10/19/18 0516  AST 36  ALT 77*  ALKPHOS 82  BILITOT 0.3  PROT 5.2*  ALBUMIN 2.1*   No results for input(s): LIPASE, AMYLASE in the last 168 hours. No results for input(s): AMMONIA in the last 168 hours. Coagulation Profile: No results for input(s): INR, PROTIME in the last 168 hours. Cardiac Enzymes: No results for input(s): CKTOTAL, CKMB, CKMBINDEX, TROPONINI in the last 168 hours. BNP (last 3 results) No results for input(s): PROBNP in the last 8760 hours. HbA1C: No results for input(s): HGBA1C in the last 72 hours. CBG: Recent Labs  Lab 10/20/18 1635 10/20/18 2031 10/21/18 0036 10/21/18 0420 10/21/18 0752  GLUCAP 149* 98 101* 83 142*   Lipid Profile: No results for input(s): CHOL, HDL, LDLCALC, TRIG, CHOLHDL, LDLDIRECT in the last 72 hours. Thyroid Function Tests: No results for input(s): TSH, T4TOTAL, FREET4, T3FREE, THYROIDAB in the last 72 hours. Anemia Panel: No results for input(s): VITAMINB12, FOLATE, FERRITIN, TIBC, IRON, RETICCTPCT in the last 72 hours. Sepsis Labs: No results for input(s): PROCALCITON, LATICACIDVEN in the last 168 hours.  Recent Results (from the past 240 hour(s))  Culture, respiratory (non-expectorated)     Status: None   Collection Time: 10/13/18  4:26 AM   Specimen: Tracheal Aspirate; Respiratory  Result Value Ref Range Status   Specimen Description TRACHEAL ASPIRATE  Final   Special Requests NONE   Final   Gram Stain NO WBC SEEN NO ORGANISMS SEEN   Final   Culture   Final    Consistent with normal respiratory flora. Performed at [facility], [address], [place], [place] [address]    Report Status 10/15/2018 FINAL  Final  Culture, blood (routine x 2)     Status: None   Collection Time: 10/15/18  7:43 AM   Specimen: BLOOD LEFT WRIST  Result Value Ref Range Status   Specimen Description BLOOD LEFT WRIST  Final   Special Requests   Final    BOTTLES DRAWN AEROBIC ONLY Blood  Culture adequate volume   Culture   Final    NO GROWTH 5 DAYS Performed at [facility], [address], [place], [place] [address]    Report Status 10/20/2018 FINAL  Final  Culture, blood (routine x 2)     Status: None   Collection Time: 10/15/18  7:51 AM   Specimen: BLOOD  Result Value Ref Range Status   Specimen Description BLOOD LEFT ANTECUBITAL  Final   Special Requests   Final    BOTTLES DRAWN AEROBIC ONLY Blood Culture adequate volume   Culture   Final    NO GROWTH 5 DAYS Performed at [facility], [address] [place], [place] [address]    Report Status 10/20/2018 FINAL  Final  MRSA PCR Screening     Status: None   Collection Time: 10/15/18  6:17 PM   Specimen: Nasopharyngeal  Result Value Ref Range Status   MRSA by PCR NEGATIVE NEGATIVE Final    Comment:        The GeneXpert MRSA Assay (FDA approved for NASAL specimens only), is one component of a comprehensive MRSA colonization surveillance program. It is not intended to diagnose MRSA infection nor to guide or monitor treatment for MRSA infections. Performed at [facility], [address] [place], [place] [address]   SARS Coronavirus 2 (CEPHEID - Performed in [facility] hospital lab), Hosp Order     Status: None   Collection Time: 10/18/18  5:01 PM   Specimen: Nasopharyngeal Swab  Result Value Ref Range Status   SARS Coronavirus 2 NEGATIVE NEGATIVE Final    Comment: (NOTE) If result  is NEGATIVE SARS-CoV-2 target nucleic acids are NOT DETECTED. The SARS-CoV-2 RNA is generally detectable in upper and lower  respiratory specimens during the acute phase of infection. The lowest  concentration of SARS-CoV-2 viral copies this assay can detect is 250  copies / mL. A negative result does not preclude SARS-CoV-2 infection  and should not be used as the sole basis for treatment or other  patient management decisions.  A negative result may occur with  improper specimen collection / handling, submission of specimen other  than nasopharyngeal swab, presence of viral mutation(s) within the  areas targeted by this assay, and inadequate number of viral copies  (<250 copies / mL). A negative result must be combined with clinical  observations, patient history, and epidemiological information. If result is POSITIVE SARS-CoV-2 target nucleic acids are DETECTED. The SARS-CoV-2 RNA is generally detectable in upper and lower  respiratory specimens dur ing the acute phase of infection.  Positive  results are indicative of active infection with SARS-CoV-2.  Clinical  correlation with patient history and other diagnostic information is  necessary to determine patient infection status.  Positive results do  not rule out bacterial infection or co-infection with other viruses. If result is PRESUMPTIVE POSTIVE SARS-CoV-2 nucleic acids MAY BE PRESENT.   A presumptive positive result was obtained on the submitted specimen  and confirmed on repeat testing.  While 2019 novel coronavirus  (SARS-CoV-2) nucleic acids may be present in the submitted sample  additional confirmatory testing may be necessary for epidemiological  and / or clinical management purposes  to differentiate between  SARS-CoV-2 and other Sarbecovirus currently known to infect humans.  If clinically indicated additional testing with an alternate test  methodology [phone]) is advised. The SARS-CoV-2 RNA is generally   detectable in upper and lower respiratory sp ecimens during the acute  phase of  infection. The expected result is Negative. Fact Sheet for Patients:  BoilerBrush.com.cy Fact Sheet for Healthcare Providers: https://pope.com/ This test is not yet approved or cleared by the [place] FDA and has been authorized for detection and/or diagnosis of SARS-CoV-2 by FDA under an Emergency Use Authorization (EUA).  This EUA will remain in effect (meaning this test can be used) for the duration of the COVID-19 declaration under Section 564(b)(1) of the Act, 21 U.S.C. section 360bbb-3(b)(1), unless the authorization is terminated or revoked sooner. Performed at [facility], [address] [place], [place] [address]   C difficile quick scan w PCR reflex     Status: None   Collection Time: 10/18/18  8:32 PM   Specimen: STOOL  Result Value Ref Range Status   C Diff antigen NEGATIVE NEGATIVE Final   C Diff toxin NEGATIVE NEGATIVE Final   C Diff interpretation No C. difficile detected.  Final    Comment: Performed at [facility], [address], [place], [place] [address]  Culture, body fluid-bottle     Status: None (Preliminary result)   Collection Time: 10/19/18  3:20 PM   Specimen: Pleura  Result Value Ref Range Status   Specimen Description PLEURAL  Final   Special Requests NONE  Final   Culture   Final    NO GROWTH 2 DAYS Performed at [facility], [address] [place], [place] [address]    Report Status PENDING  Incomplete  Gram stain     Status: None   Collection Time: 10/19/18  3:20 PM   Specimen: Pleura  Result Value Ref Range Status   Specimen Description PLEURAL  Final   Special Requests NONE  Final   Gram Stain   Final    RARE WBC PRESENT, PREDOMINANTLY PMN NO ORGANISMS SEEN Performed at [facility], [address], [place], [place] [address]    Report Status  10/20/2018 FINAL  Final         Radiology Studies: Dg Chest 1 View  Result Date: 10/19/2018 CLINICAL DATA:  S/P right thoracentesis. EXAM: CHEST  1 VIEW COMPARISON:  10/18/2018 FINDINGS: Tracheostomy tube is unchanged. The heart size is normal. Small RIGHT pleural effusion, decreased since prior study. No pneumothorax. Minimal bibasilar atelectasis. IMPRESSION: Decreased RIGHT pleural effusion. No pneumothorax. Electronically Signed   By: Norva Pavlov M.D.   On: 10/19/2018 15:43   US Thoracentesis Asp Pleural Space W/img Guide  Result Date: 10/19/2018 INDICATION: Shortness of breath. Right-sided pleural effusion. Request for diagnostic and therapeutic thoracentesis. EXAM: ULTRASOUND GUIDED RIGHT THORACENTESIS MEDICATIONS: None. COMPLICATIONS: None immediate. PROCEDURE: An ultrasound guided thoracentesis was thoroughly discussed with the patient and questions answered. The benefits, risks, alternatives and complications were also discussed. The patient understands and wishes to proceed with the procedure. Written consent was obtained. Ultrasound was performed to localize and mark an adequate pocket of fluid in the right chest. The area was then prepped and draped in the normal sterile fashion. 1% Lidocaine was used for local anesthesia. Under ultrasound guidance a 6 Fr Safe-T-Centesis catheter was introduced. Thoracentesis was performed. The catheter was removed and a dressing applied. FINDINGS: A total of approximately 450 mL of clear, pale yellow fluid was removed. Samples were sent to the laboratory as requested by the clinical team. IMPRESSION: Successful ultrasound guided right thoracentesis yielding 450 mL of pleural fluid. Read by: Brayton El PA-C Electronically Signed   By: Simonne Come M.D.   On: 10/19/2018 15:29  Scheduled Meds:  amLODipine  5 mg Per Tube Daily   chlorhexidine  15 mL Mouth Rinse BID   doxazosin  1 mg Per Tube Daily   feeding supplement (JEVITY 1.5  CAL/FIBER)  237 mL Per Tube TID   feeding supplement (OSMOLITE 1.5 CAL)  237 mL Per Tube TID   feeding supplement (PRO-STAT SUGAR FREE 64)  30 mL Per Tube BID   free water  150 mL Per Tube Q4H   heparin injection (subcutaneous)  5,000 Units Subcutaneous Q8H   hydrocortisone   Rectal QID   lidocaine  1 application Urethral Once   mouth rinse  15 mL Mouth Rinse q12n4p   metoprolol tartrate  50 mg Per Tube QID   pantoprazole sodium  40 mg Per Tube Daily   scopolamine  1 patch Transdermal Q72H   simethicone  160 mg Per Tube QID   simvastatin  20 mg Per Tube q1800   Continuous Infusions:  sodium chloride Stopped (10/19/18 1736)   azithromycin Stopped (10/20/18 1900)   ceFEPime (MAXIPIME) IV 2 g (10/21/18 0631)   lactated ringers 10 mL/hr at 10/07/18 0859   sodium chloride 500 mL/hr at 10/14/18 2049     LOS: 24 days    Time spent: 40 minutes    Ramiro Harvest, MD [facility]  If 7PM-7AM, please contact night-coverage [email or web address] 10/21/2018, 11:20 AM

## 2018-10-21 NOTE — Progress Notes (Signed)
PT Cancellation Note  Patient Details Name: Logan Price MRN: [id number] DOB: 09-29-1945   Cancelled Treatment:    Reason Eval/Treat Not Completed: Patient declined, no reason specified  Patient resting Rt sideling in bed at PT arrival. He declined to participate in therapy this PM. ~ 5 minutes spent educating patient on importance of participating in exercise to prevent muscle atrophy and weakness from progressing. Offered to assist patient with sitting up for exercises EOB but he declined this as well as transfer or walking today. Will follow up with patient at later date/time.  Kipp Brood, PT, DPT, [facility] Physical Therapist with [facility]  10/21/2018 3:35 PM

## 2018-10-21 NOTE — Progress Notes (Signed)
Speech Language Pathology Treatment: [name] Speaking valve  Patient Details Name: Logan Price MRN: [id number] DOB: 06-24-1945 Today's Date: 10/21/2018 Time: 4098-1191 SLP Time Calculation (min) (ACUTE ONLY): 23 min  Assessment / Plan / Recommendation Clinical Impression  Today addressed PMSV usage with pt, he reports using for one hour a few days ago but has not been consistent.  Pt agreeable to try valve during session.  Pt does have a large amount of viscous white secretions but is able to clear them from trach with cued cough and then later orally with PMSV in place.  Pt wore his PMSV for approximately 20 minutes with good tolerance.  Vitals remained stable and pt maintained a strong voice with good breath support for full sentences!  Requested pt to use his PMSV more and asked if he wanted to speak to his wife during the session - to which he stated "no".  Demonstrated donning and removal of PMSV to pt and asked him to return demonstration.  He attempted to remove valve but reported his oxygen monitor on his left hand impacted his manipulation. Recommend he continue to use PMSV when someone is present until he can remove it himself or assured that he is able to receive assist to remove it if dyspneic - especially given his amount of secretions.  Will follow up to encourage pt to consume ice chips and to maximize PMSV use for secretion management and improved communication.  Pt answered questions directly today but did not expand on information provided.    SLP also introduced pt to several free APPS for basic communication from several companies.  Demonstrated several optional APPS on SLPs phone.   After review, pt politely indicated he was not interested in them.   Will introduce them to his wife if she is to visit the hospital as with pt starting XRT, he may be less inclined to voice and they may be beneficial.       HPI HPI: Pt is a 73 yo male admitted for planned trach and laryngeal  biopsy 7/2. 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 Esophagram 6/26 showed silent gross aspiration of barium. PMH includes: HLD, HTN, hepatitis C, GERD, diverticulitis      SLP Plan  Continue with current plan of care       Recommendations  Medication Administration: Via alternative means      Patient may use Passy-Muir Speech Valve: During all therapies with supervision;Intermittently with supervision PMSV Supervision: Full(until pt is able to remove valve independently)         Oral Care Recommendations: Oral care BID Follow up Recommendations: Outpatient SLP;Home health SLP SLP Visit Diagnosis: Aphonia (R49.1) Plan: Continue with current plan of care       GO               Donavan Burnet, MS [facility] SLP Acute Rehab Services Pager [phone] Office [phone]  Chales Abrahams 10/21/2018, 6:38 PM

## 2018-10-21 NOTE — Progress Notes (Signed)
Nutrition Follow-up  DOCUMENTATION CODES:   Severe malnutrition in context of chronic illness  INTERVENTION:  - will adjust TF regimen: 1 can Dillard Essex Peptide 1.5 x5/day which will provide 2500 kcal, 120 grams protein, and 15 grams of fiber.  - continue 150 ml free water x6/day (900 ml/day) - will continue to monitor TF tolerance.    NUTRITION DIAGNOSIS:   Severe Malnutrition related to chronic illness, cancer and cancer related treatments as evidenced by percent weight loss, severe muscle depletion, moderate muscle depletion, mild fat depletion. -ongoing  GOAL:   Patient will meet greater than or equal to 90% of their needs -unmet with skipped TF boluses.   MONITOR:   Labs, I & O's, Skin, TF tolerance, Weight trends  ASSESSMENT:   Patient with PMH significant for diverticulitis, gastritis, HLD, HTN, laryngeal cancer, and esophogeal dysphagia. 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  Current weight is consistent with admission weight, but weight has fluctuated frequently throughout hospitalization. Patient remains NPO with PEG in place. Order for 1 can Jevity 1.5 TID and 1 can Osmolite 1.5 TID with 30 ml prostat BID and 150 ml free water every 4 hours. This regimen provides 2330 kcal, 120 grams protein, 15 grams fiber, and 1983 ml free water.   Patient had 5 type 7 stools yesterday. He reports ongoing nausea and that BMs are the same as what he described to this RD on Thursday. C diff was negative. Patient underwent radiation simulation on 7/22 and plan is to start radiation tomorrow (7/28).   Consult received to re-assess TF. Will adjust TF regimen as outlined above. Dillard Essex is a peptide-based, hydrolyzed pea protein formula.   Labs reviewed; CBGs: 101, 83, and 142 mg/dl today, Ca: 8.2 mg/dl. Medications reviewed; 2 mg imodium per PEG PRN, 2 g IV Mg sulfate x1 run 7/26.    Diet Order:   Diet Order             Diet NPO time specified Except for: Sips with Meds  Diet effective midnight        Diet general              EDUCATION NEEDS:   Not appropriate for education at this time  Skin:  Skin Assessment: Skin Integrity Issues: Skin Integrity Issues:: Incisions, Other (Comment) Incisions: neck/abdomen, R chest (7/20) Other: new wound-sacrum  Last BM:  diarrhea x5 on 7/26  Height:   Ht Readings from Last 1 Encounters:  09/27/18 5\' 9"  (1.753 m)    Weight:   Wt Readings from Last 1 Encounters:  10/21/18 63.3 kg    Ideal Body Weight:  72.7 kg  BMI:  Body mass index is 20.61 kg/m.  Estimated Nutritional Needs:   Kcal:  2300-2500 kcal  Protein:  115-130 grams  Fluid:  >/= 2.3 L/day     Logan Matin, MS, RD, LDN, [facility] Inpatient Clinical Dietitian Pager # [phone] After hours/weekend pager # [phone]

## 2018-10-21 NOTE — Progress Notes (Signed)
Speech Language Pathology Treatment: Dysphagia  Patient Details Name: Logan Price MRN: [id number] DOB: [birth year] Today's Date: 10/21/2018 Time: 4010-2725 SLP Time Calculation (min) (ACUTE ONLY): 23 min  Assessment / Plan / Recommendation Clinical Impression  Education re: importance of oral care, recommendation for pt to consume melted single small ice chips after oral care to mitigate disuse muscle atrophy of orophayrngeal and laryngeal musculature; discussed with patient that he is likely having some secretion aspiration and tolerating due to his strength of voice/cough and ability to mobilize - benefits vs risks were discussed as well as plan to monitor vitals, etc to assure tolerance and pH neutral status of water making it safest to aspirate - however pt continued to decline ice chip trials.     SLP encouraged him to swish and expectorate with water throughout the day to aid oral hygeine and promote swallowing conducting several dry swallows with each oral rinsing; encouraged pt to maintain oral care at least BID - if not TID; will provide pt with swallowing exercises to decrease disuse atrophy and muscle fibrosis from XRT - of note, pt's RN reports his wife states pt is not motivated at home and sits around and needs encouragement.     SLP is concerned re: pt's swallowing prognosis if he is noncommittal to rehab program and will encourage his participation.     HPI HPI: Pt is a 73 yo male admitted for planned trach and laryngeal biopsy 7/2. 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 Esophagram 6/26 showed silent gross aspiration of barium. PMH includes: HLD, HTN, hepatitis C, GERD, diverticulitis      SLP Plan  Continue with current plan of care       Recommendations  Medication Administration: Via alternative means                Oral Care Recommendations: Oral care BID Follow up Recommendations:  Outpatient SLP;Home health SLP SLP Visit Diagnosis: Dysphagia, unspecified (R13.10) Plan: Continue with current plan of care       GO              Donavan Burnet, MS [facility] SLP Acute Rehab Services Pager [phone] Office [phone]   Chales Abrahams 10/21/2018, 5:58 PM

## 2018-10-22 ENCOUNTER — Ambulatory Visit
Admit: 2018-10-22 | Discharge: 2018-10-22 | Disposition: A | Discharge status: Home / Self Care | Payer: Medicare Other | Attending: Radiation Oncology | Admitting: Radiation Oncology

## 2018-10-22 LAB — BASIC METABOLIC PANEL
Anion gap: 4 — ABNORMAL LOW (ref 5–15)
BUN: 16 mg/dL (ref 8–23)
CO2: 29 mmol/L (ref 22–32)
Calcium: 8.3 mg/dL — ABNORMAL LOW (ref 8.9–10.3)
Chloride: 105 mmol/L (ref 98–111)
Creatinine, Ser: 0.63 mg/dL (ref 0.61–1.24)
GFR calc Af Amer: >60 mL/min (ref >60)
GFR calc non Af Amer: >60 mL/min (ref >60)
Glucose, Bld: 104 mg/dL — ABNORMAL HIGH (ref 70–99)
Potassium: 4 mmol/L (ref 3.5–5.1)
Sodium: 138 mmol/L (ref 135–145)

## 2018-10-22 LAB — GLUCOSE, CAPILLARY
Glucose-Capillary: 105 mg/dL — ABNORMAL HIGH (ref 70–99)
Glucose-Capillary: 107 mg/dL — ABNORMAL HIGH (ref 70–99)
Glucose-Capillary: 110 mg/dL — ABNORMAL HIGH (ref 70–99)
Glucose-Capillary: 110 mg/dL — ABNORMAL HIGH (ref 70–99)
Glucose-Capillary: 113 mg/dL — ABNORMAL HIGH (ref 70–99)
Glucose-Capillary: 83 mg/dL (ref 70–99)

## 2018-10-22 LAB — CBC
HCT: 30.1 % — ABNORMAL LOW (ref 39.0–52.0)
Hemoglobin: 8.9 g/dL — ABNORMAL LOW (ref 13.0–17.0)
MCH: 27.6 pg (ref 26.0–34.0)
MCHC: 29.6 g/dL — ABNORMAL LOW (ref 30.0–36.0)
MCV: 93.5 fL (ref 80.0–100.0)
Platelets: 316 10*3/uL (ref 150–400)
RBC: 3.22 MIL/uL — ABNORMAL LOW (ref 4.22–5.81)
RDW: 14.9 % (ref 11.5–15.5)
WBC: 11.5 10*3/uL — ABNORMAL HIGH (ref 4.0–10.5)
nRBC: 0 % (ref 0.0–0.2)

## 2018-10-22 NOTE — Progress Notes (Signed)
Pt continue to have urinary retention, I/O cath during the night and bladder scanned with increased volume of urine. Last scan 480cc noted, order to replace foley noted. Pt unable to void. Tolerated well. 550 of Clear yellow urine noted. SRP, RN

## 2018-10-22 NOTE — Progress Notes (Signed)
PROGRESS NOTE    Logan Price  WUJ:[id number] DOB: 05/02/1945 DOA: 09/27/2018 PCP: [name], FNP    Brief Narrative:  73 year old with history of GERD, essential hypertension, hyperlipidemia who recently diagnosed neck mass concerning for malignancy was admitted to the hospital.  Overnight patient went into SVT therefore medical team was consulted.  Patient was given adenosine x1 which converted back to normal sinus rhythm.  G-tube placed by surgery 7/13, tolerating tube feeds.  Metoprolol was started via PEG tube.  Hospitals course was later on complicated by Pseudomonas bacteremia secondary to infected Port-A-Cath.  Patient was transferred to medicine service.  Infectious disease team were consulted who recommended removing Port-A-Cath.    Assessment & Plan:   Principal Problem:   Squamous cell carcinoma of glottis (HCC) Active Problems:   Laryngeal mass   Goals of care, counseling/discussion   Dysphagia   Loss of weight   PSVT (paroxysmal supraventricular tachycardia) (HCC)   Protein-calorie malnutrition, severe   Bacteremia   Leukocytosis   Normocytic anemia   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HCC)   Pleural effusion   Shortness of breath  Pseudomonas bacteremia -Likely source was Port-A-Cath which was removed on 10/14/2018.    Patient seen in consultation by ID who are recommending continuation of IV cefepime for total of 2 weeks post Port-A-Cath removal.  Repeat blood cultures pending with no growth to date.  If repeat blood cultures negative, PICC line can be placed in case if he gets transition to skilled nursing facility in the meantime.  Acute kidney injury secondary to acute urinary retention -Patient had over 1 L of urine in his bladder.  Creatinine as high as 5.3, after placing Foley catheter 10/14/2018 his creatinine has trended down to 0.69.  Continue Foley catheter.   Urine output of 1.0 L over the past 24 hours.  Patient failed voiding trial.  Place Foley catheter back in.   Will need outpatient follow-up with urology.     Paroxysmal supraventricular tachycardia; intermittent and recurrent -??  Etiology.  Patient with narrow complex tachycardia with a regular rate.  Patient received a dose of adenosine now in sinus rhythm with heart rates in the 90s.  TSH within normal limits.  2D echo normal.  Keep potassium > 4.  Keep magnesium > 2.  Increased dose of metoprolol to 50 mg 4 times daily per G-tube as patient noted to have a 25 beat run of nonsustained V. tach in the early hours of 10/20/2018.  Patient was seen by cardiology who have signed off.   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  Patient with increasing secretions.   Status post G-tube placement 7/13.    Patient currently on tube feeds.  -Due to patient's increased secretions patient was placed on a scopolamine patch.  Some improvement with secretions.  Patient not on Robinul on MAR.   -Nutrition team is following. -Patient being followed by oncology.  Patient was transferred to [facility] for radiation oncology evaluation and to be started on radiation treatment.  Patient underwent simulation on 10/16/2018.  Per radiation oncology note patient to start radiation treatments today Tuesday, 10/22/2018.  Janina Mayo care per ENT.  Shortness of breath/transudative pleural effusion/pneumonia Patient with complaints of shortness of breath on 10/18/2018, which he stated had been intermittent and worse than post trach placement.  Patient is +8.9 L during this hospitalization as of 10/19/2018.  Patient seems to be auto diuresing with  a urine output of 1.0 L over the past 24 hours..  Current weight is 139 pounds (10/21/2018 ) from 140.3 pounds on 10/19/2018. Repeat chest x-ray done 10/18/2018 with concerns for right basilar infiltrate and right pleural effusion.  Ultrasound guided thoracentesis ordered and done on 10/19/2018 with  450 cc of clear fluid removed.  By lights criteria transudative effusion.  Cytology pending.  Cultures negative to date.  Patient with some clinical improvement.Marland Kitchen  Sputum Gram stain and culture pending.  Urine Legionella antigen pending.  Urine pneumococcus antigen negative.  Antibiotics have been broadened patient currently on IV vancomycin, azithromycin and cefepime.  IV vancomycin has been discontinued.  Will discontinue azithromycin after today's dose as patient has had 5 days of azithromycin.  Continue IV cefepime.  Strict I's and O's.  Daily weights.  Follow.  Diarrhea Likely secondary to tube feeds.  C. difficile PCR was done which was negative.  Imodium 4 mg x 1 given on 10/19/2018.  Stools with a more softer consistency today per patient.  Patient has been assessed by dietitian and tube feeds adjusted.  Continue Imodium as needed.  Follow.     Asymptomatic NSVT  Patient noted per RN to have a 25 beat run of V. tach overnight/early (10/20/2018).  No further episodes noted.  Patient asymptomatic.  Potassium of 4.0.  Magnesium at 2.2.  Metoprolol dose was increased to 50 mg 4 times daily. Follow.     DVT prophylaxis: Heparin Code Status: Full Family Communication: Updated patient. Disposition Plan: To be determined.   Consultants:   Oncology: Dr. Dion Body 09/30/2018  Interventional radiology: Dr. Ardelle Anton 10/02/2018  [facility] hospitalist: Dr. Julian Reil 10/03/2018  General surgery: Dr. Luisa Hart 10/06/2018  Dental surgery: Dr. Valentino Hue 10/08/2018  Cardiology: Dr. Wyline Mood 10/13/2018  Infectious disease: Dr. Daiva Eves 10/13/2018  Radiation oncology  Procedures:   CT soft tissue neck 09/28/2018  CT chest with contrast 09/29/2018  CT abdomen 10/01/2018  Orthopantogram 10/08/2018  Percutaneous placement of gastrostomy tube 10/04/2018 per Dr. Ardelle Anton interventional radiology  Port-A-Cath removal per interventional radiology 10/14/2018  Ultrasound-guided thoracentesis 10/19/2018 per Brayton El, PA--- 450  cc of clear pale yellow fluid removed.  Antimicrobials:   IV cefepime 10/12/2018 >>>> 10/29/2018  IV Levaquin 10/11/2018>>>>> 10/12/2018  IV vancomycin 10/18/2018>>>>>> 10/20/2018  IV azithromycin 10/18/2018>>>> 10/22/2018   Subjective: Patient laying in bed.  Feels shortness of breath is improving.  Denies any chest pain.  States slight improvement with secretions.  Per RN scopolamine patch has been fallen off.  No further beats of asymptomatic V. tach noted.  Loose stools improving.   Objective: Vitals:   10/21/18 2359 10/22/18 0327 10/22/18 0412 10/22/18 0505  BP: (!) 142/86  (!) 144/92   Pulse: 95 (!) 107 87   Resp: 20 18 20    Temp: 98.4 F (36.9 C)  98.3 F (36.8 C)   TempSrc: Oral  Oral   SpO2: 95% 95% 100%   Weight:    63 kg  Height:        Intake/Output Summary (Last 24 hours) at 10/22/2018 1055 Last data filed at 10/22/2018 0300 Gross per 24 hour  Intake 600 ml  Output 525 ml  Net 75 ml   Filed Weights   10/19/18 0520 10/21/18 0640 10/22/18 0505  Weight: 63.6 kg 63.3 kg 63 kg    Examination:  General exam: NAD Respiratory system: Decreasing diffuse coarse breath sounds.  No wheezing.  Tracheostomy intact.  Secretions noted.  Cardiovascular system: RRR no murmurs rubs or gallops.  No  JVD.  No lower extremity edema. Gastrointestinal system: Abdomen is nontender, nondistended, soft, positive bowel sounds.  No rebound.  No guarding.  Central nervous system: Alert and oriented. No focal neurological deficits. Extremities: Symmetric 5 x 5 power. Skin: No rashes, lesions or ulcers Psychiatry: Judgement and insight appear normal. Mood & affect appropriate.     Data Reviewed: I have personally reviewed following labs and imaging studies  CBC: Recent Labs  Lab 10/18/18 0417 10/19/18 0516 10/20/18 0515 10/21/18 0450 10/22/18 0417  WBC 11.4* 11.5* 12.1* 10.7* 11.5*  NEUTROABS  --  7.7 8.7* 7.7  --   HGB 9.0* 9.2* 8.9* 8.7* 8.9*  HCT 29.2* 31.4* 28.7* 29.3* 30.1*   MCV 90.4 96.3 91.4 93.0 93.5  PLT 328 313 329 316 316   Basic Metabolic Panel: Recent Labs  Lab 10/17/18 0207 10/18/18 0417 10/19/18 0516 10/20/18 0510 10/20/18 0515 10/21/18 0450 10/22/18 0417  NA 137 138 141  --  140 141 138  K 4.2 3.7 4.6  --  4.2 4.5 4.0  CL 104 107 110  --  106 105 105  CO2 25 24 22   --  29 28 29   GLUCOSE 129* 99 115*  --  109* 106* 104*  BUN 14 13 13   --  13 16 16   CREATININE 0.60* 0.58* 0.59*  --  0.69 0.69 0.63  CALCIUM 8.1* 7.7* 8.0*  --  8.2* 8.2* 8.3*  MG 2.0 1.7 2.1 1.8  --  2.2  --    GFR: Estimated Creatinine Clearance: 75.5 mL/min (by C-G formula based on SCr of 0.63 mg/dL). Liver Function Tests: Recent Labs  Lab 10/19/18 0516  AST 36  ALT 77*  ALKPHOS 82  BILITOT 0.3  PROT 5.2*  ALBUMIN 2.1*   No results for input(s): LIPASE, AMYLASE in the last 168 hours. No results for input(s): AMMONIA in the last 168 hours. Coagulation Profile: No results for input(s): INR, PROTIME in the last 168 hours. Cardiac Enzymes: No results for input(s): CKTOTAL, CKMB, CKMBINDEX, TROPONINI in the last 168 hours. BNP (last 3 results) No results for input(s): PROBNP in the last 8760 hours. HbA1C: No results for input(s): HGBA1C in the last 72 hours. CBG: Recent Labs  Lab 10/21/18 1734 10/21/18 1956 10/21/18 2357 10/22/18 0410 10/22/18 0737  GLUCAP 123* 119* 107* 83 105*   Lipid Profile: No results for input(s): CHOL, HDL, LDLCALC, TRIG, CHOLHDL, LDLDIRECT in the last 72 hours. Thyroid Function Tests: No results for input(s): TSH, T4TOTAL, FREET4, T3FREE, THYROIDAB in the last 72 hours. Anemia Panel: No results for input(s): VITAMINB12, FOLATE, FERRITIN, TIBC, IRON, RETICCTPCT in the last 72 hours. Sepsis Labs: No results for input(s): PROCALCITON, LATICACIDVEN in the last 168 hours.  Recent Results (from the past 240 hour(s))  Culture, respiratory (non-expectorated)     Status: None   Collection Time: 10/13/18  4:26 AM   Specimen:  Tracheal Aspirate; Respiratory  Result Value Ref Range Status   Specimen Description TRACHEAL ASPIRATE  Final   Special Requests NONE  Final   Gram Stain NO WBC SEEN NO ORGANISMS SEEN   Final   Culture   Final    Consistent with normal respiratory flora. Performed at [facility], [address], [place], [place] [address]    Report Status 10/15/2018 FINAL  Final  Culture, blood (routine x 2)     Status: None   Collection Time: 10/15/18  7:43 AM   Specimen: BLOOD LEFT WRIST  Result Value Ref Range Status  Specimen Description BLOOD LEFT WRIST  Final   Special Requests   Final    BOTTLES DRAWN AEROBIC ONLY Blood Culture adequate volume   Culture   Final    NO GROWTH 5 DAYS Performed at [facility], [address] [place], [place] [address]    Report Status 10/20/2018 FINAL  Final  Culture, blood (routine x 2)     Status: None   Collection Time: 10/15/18  7:51 AM   Specimen: BLOOD  Result Value Ref Range Status   Specimen Description BLOOD LEFT ANTECUBITAL  Final   Special Requests   Final    BOTTLES DRAWN AEROBIC ONLY Blood Culture adequate volume   Culture   Final    NO GROWTH 5 DAYS Performed at [facility], [address] [place], [place] [address]    Report Status 10/20/2018 FINAL  Final  MRSA PCR Screening     Status: None   Collection Time: 10/15/18  6:17 PM   Specimen: Nasopharyngeal  Result Value Ref Range Status   MRSA by PCR NEGATIVE NEGATIVE Final    Comment:        The GeneXpert MRSA Assay (FDA approved for NASAL specimens only), is one component of a comprehensive MRSA colonization surveillance program. It is not intended to diagnose MRSA infection nor to guide or monitor treatment for MRSA infections. Performed at [facility], [address], [place], [place] [address]   SARS Coronavirus 2 (CEPHEID - Performed in [facility] hospital lab), Hosp Order     Status: None   Collection Time: 10/18/18  5:01 PM    Specimen: Nasopharyngeal Swab  Result Value Ref Range Status   SARS Coronavirus 2 NEGATIVE NEGATIVE Final    Comment: (NOTE) If result is NEGATIVE SARS-CoV-2 target nucleic acids are NOT DETECTED. The SARS-CoV-2 RNA is generally detectable in upper and lower  respiratory specimens during the acute phase of infection. The lowest  concentration of SARS-CoV-2 viral copies this assay can detect is 250  copies / mL. A negative result does not preclude SARS-CoV-2 infection  and should not be used as the sole basis for treatment or other  patient management decisions.  A negative result may occur with  improper specimen collection / handling, submission of specimen other  than nasopharyngeal swab, presence of viral mutation(s) within the  areas targeted by this assay, and inadequate number of viral copies  (<250 copies / mL). A negative result must be combined with clinical  observations, patient history, and epidemiological information. If result is POSITIVE SARS-CoV-2 target nucleic acids are DETECTED. The SARS-CoV-2 RNA is generally detectable in upper and lower  respiratory specimens dur ing the acute phase of infection.  Positive  results are indicative of active infection with SARS-CoV-2.  Clinical  correlation with patient history and other diagnostic information is  necessary to determine patient infection status.  Positive results do  not rule out bacterial infection or co-infection with other viruses. If result is PRESUMPTIVE POSTIVE SARS-CoV-2 nucleic acids MAY BE PRESENT.   A presumptive positive result was obtained on the submitted specimen  and confirmed on repeat testing.  While 2019 novel coronavirus  (SARS-CoV-2) nucleic acids may be present in the submitted sample  additional confirmatory testing may be necessary for epidemiological  and / or clinical management purposes  to differentiate between  SARS-CoV-2 and other Sarbecovirus currently known to infect humans.  If  clinically indicated additional testing with an alternate test  methodology [phone])  is advised. The SARS-CoV-2 RNA is generally  detectable in upper and lower respiratory sp ecimens during the acute  phase of infection. The expected result is Negative. Fact Sheet for Patients:  BoilerBrush.com.cy Fact Sheet for Healthcare Providers: https://pope.com/ This test is not yet approved or cleared by the [place] FDA and has been authorized for detection and/or diagnosis of SARS-CoV-2 by FDA under an Emergency Use Authorization (EUA).  This EUA will remain in effect (meaning this test can be used) for the duration of the COVID-19 declaration under Section 564(b)(1) of the Act, 21 U.S.C. section 360bbb-3(b)(1), unless the authorization is terminated or revoked sooner. Performed at [facility], [address] [place], [place] [address]   C difficile quick scan w PCR reflex     Status: None   Collection Time: 10/18/18  8:32 PM   Specimen: STOOL  Result Value Ref Range Status   C Diff antigen NEGATIVE NEGATIVE Final   C Diff toxin NEGATIVE NEGATIVE Final   C Diff interpretation No C. difficile detected.  Final    Comment: Performed at [facility], [address] [place], [place] [address]  Culture, body fluid-bottle     Status: None (Preliminary result)   Collection Time: 10/19/18  3:20 PM   Specimen: Pleura  Result Value Ref Range Status   Specimen Description PLEURAL  Final   Special Requests NONE  Final   Culture   Final    NO GROWTH 3 DAYS Performed at [facility], [address], [place], [place] [address]    Report Status PENDING  Incomplete  Gram stain     Status: None   Collection Time: 10/19/18  3:20 PM   Specimen: Pleura  Result Value Ref Range Status   Specimen Description PLEURAL  Final   Special Requests NONE  Final   Gram Stain   Final    RARE WBC PRESENT,  PREDOMINANTLY PMN NO ORGANISMS SEEN Performed at [facility], [address] [place], [place] [address]    Report Status 10/20/2018 FINAL  Final         Radiology Studies: No results found.      Scheduled Meds:  amLODipine  5 mg Per Tube Daily   chlorhexidine  15 mL Mouth Rinse BID   doxazosin  1 mg Per Tube Daily   free water  150 mL Per Tube Q4H   heparin injection (subcutaneous)  5,000 Units Subcutaneous Q8H   hydrocortisone   Rectal QID   Molli Posey Peptide 1.5  325 mL Oral 5 X Daily   lidocaine  1 application Urethral Once   mouth rinse  15 mL Mouth Rinse q12n4p   metoprolol tartrate  50 mg Per Tube QID   pantoprazole sodium  40 mg Per Tube Daily   scopolamine  1 patch Transdermal Q72H   simethicone  160 mg Per Tube QID   simvastatin  20 mg Per Tube q1800   Continuous Infusions:  sodium chloride Stopped (10/19/18 1736)   azithromycin Stopped (10/21/18 1710)   ceFEPime (MAXIPIME) IV 2 g (10/22/18 0504)   lactated ringers 10 mL/hr at 10/07/18 0859   sodium chloride 500 mL/hr at 10/14/18 2049     LOS: 25 days    Time spent: 40 minutes    Ramiro Harvest, MD [facility]  If 7PM-7AM, please contact night-coverage [email or web address] 10/22/2018, 10:55 AM

## 2018-10-22 NOTE — Plan of Care (Signed)
  Problem: Education: Goal: Knowledge of General Education information will improve Description: Including pain rating scale, medication(s)/side effects and non-pharmacologic comfort measures Outcome: Progressing   Problem: Health Behavior/Discharge Planning: Goal: Ability to manage health-related needs will improve Outcome: Progressing   Problem: Clinical Measurements: Goal: Ability to maintain clinical measurements within normal limits will improve Outcome: Progressing Goal: Will remain free from infection Outcome: Progressing Goal: Diagnostic test results will improve Outcome: Progressing Goal: Respiratory complications will improve Outcome: Progressing Goal: Cardiovascular complication will be avoided Outcome: Progressing   Problem: Activity: Goal: Risk for activity intolerance will decrease Outcome: Progressing   Problem: Nutrition: Goal: Adequate nutrition will be maintained Outcome: Progressing   Problem: Coping: Goal: Level of anxiety will decrease Outcome: Progressing   Problem: Elimination: Goal: Will not experience complications related to bowel motility Outcome: Progressing Goal: Will not experience complications related to urinary retention Outcome: Progressing   Problem: Pain Managment: Goal: General experience of comfort will improve Outcome: Progressing   Problem: Safety: Goal: Ability to remain free from injury will improve Outcome: Progressing   Problem: Skin Integrity: Goal: Risk for impaired skin integrity will decrease Outcome: Progressing   Problem: Education: Goal: Knowledge about tracheostomy care/management will improve Outcome: Progressing   Problem: Activity: Goal: Ability to tolerate increased activity will improve Outcome: Progressing   Problem: Health Behavior/Discharge Planning: Goal: Ability to manage tracheostomy will improve Outcome: Progressing   Problem: Respiratory: Goal: Patent airway maintenance will  improve Outcome: Progressing   Problem: Role Relationship: Goal: Ability to communicate will improve Outcome: Progressing   Problem: Fluid Volume: Goal: Hemodynamic stability will improve Outcome: Progressing   Problem: Clinical Measurements: Goal: Signs and symptoms of infection will decrease Outcome: Progressing

## 2018-10-23 ENCOUNTER — Ambulatory Visit
Admit: 2018-10-23 | Discharge: 2018-10-23 | Disposition: A | Discharge status: Home / Self Care | Payer: Medicare Other | Attending: Radiation Oncology | Admitting: Radiation Oncology

## 2018-10-23 LAB — GLUCOSE, CAPILLARY
Glucose-Capillary: 107 mg/dL — ABNORMAL HIGH (ref 70–99)
Glucose-Capillary: 110 mg/dL — ABNORMAL HIGH (ref 70–99)
Glucose-Capillary: 116 mg/dL — ABNORMAL HIGH (ref 70–99)
Glucose-Capillary: 80 mg/dL (ref 70–99)
Glucose-Capillary: 98 mg/dL (ref 70–99)
Glucose-Capillary: 99 mg/dL (ref 70–99)

## 2018-10-23 LAB — BASIC METABOLIC PANEL
Anion gap: 7 (ref 5–15)
BUN: 15 mg/dL (ref 8–23)
CO2: 27 mmol/L (ref 22–32)
Calcium: 8.3 mg/dL — ABNORMAL LOW (ref 8.9–10.3)
Chloride: 104 mmol/L (ref 98–111)
Creatinine, Ser: 0.66 mg/dL (ref 0.61–1.24)
GFR calc Af Amer: >60 mL/min (ref >60)
GFR calc non Af Amer: >60 mL/min (ref >60)
Glucose, Bld: 115 mg/dL — ABNORMAL HIGH (ref 70–99)
Potassium: 4.2 mmol/L (ref 3.5–5.1)
Sodium: 138 mmol/L (ref 135–145)

## 2018-10-23 LAB — CBC
HCT: 30.4 % — ABNORMAL LOW (ref 39.0–52.0)
Hemoglobin: 9 g/dL — ABNORMAL LOW (ref 13.0–17.0)
MCH: 27.5 pg (ref 26.0–34.0)
MCHC: 29.6 g/dL — ABNORMAL LOW (ref 30.0–36.0)
MCV: 93 fL (ref 80.0–100.0)
Platelets: 328 10*3/uL (ref 150–400)
RBC: 3.27 MIL/uL — ABNORMAL LOW (ref 4.22–5.81)
RDW: 14.9 % (ref 11.5–15.5)
WBC: 10.9 10*3/uL — ABNORMAL HIGH (ref 4.0–10.5)
nRBC: 0 % (ref 0.0–0.2)

## 2018-10-23 LAB — LEGIONELLA PNEUMOPHILA SEROGP 1 UR AG: L. pneumophila Serogp 1 Ur Ag: NEGATIVE

## 2018-10-23 MED ORDER — KATE FARMS PEPTIDE 1.5 PO LIQD
325.0000 mL | Freq: Three times a day (TID) | ORAL | Status: DC
  Administered 2018-10-23 – 2018-10-24 (×2): 325 mL via ORAL

## 2018-10-23 NOTE — Progress Notes (Signed)
Pt c/o of feeling full after 10am tube feeding. Pt asked RN to hold 2pm, states, "too full, my stomach...... do not eat that much at home when I could eat food. I feel bloated". Attempted several times to contact dietician, Janett Billow @ [phone]. Updated MD. Bonna Gains, RN

## 2018-10-23 NOTE — Progress Notes (Signed)
OT Cancellation Note  Patient Details Name: Logan Price MRN: [id number] DOB: Oct 23, 1945    Cancelled Treatment:    Reason Eval/Treat Not Completed: Other (comment)   Pt getting ready to go to radiation.  Pt ask OT to return next day  [place], Mickel Baas, [place] [phone] [phone]    10/23/2018, 2:39 PM

## 2018-10-23 NOTE — Progress Notes (Signed)
PT Cancellation Note  Patient Details Name: Logan Price MRN: [id number] DOB: Jul 12, 1945   Cancelled Treatment:    Reason Eval/Treat Not Completed: Fatigue/lethargy limiting ability to participate(pt stated he's going to radiation soon and it, "takes a lot out of you". He declined PT despite encouragement and explaination of benefits of mobility. Will follow.)   Philomena Doheny PT 10/23/2018  Acute Rehabilitation Services Pager [phone] Office [phone]

## 2018-10-23 NOTE — Progress Notes (Signed)
PROGRESS NOTE    Logan Price  ZOX:[id number] DOB: June 08, 1945 DOA: 09/27/2018 PCP: [name], FNP    Brief Narrative:  73 year old with history of GERD, essential hypertension, hyperlipidemia who recently diagnosed neck mass concerning for malignancy was admitted to the hospital.  Overnight patient went into SVT therefore medical team was consulted.  Patient was given adenosine x1 which converted back to normal sinus rhythm.  G-tube placed by surgery 7/13, tolerating tube feeds.  Metoprolol was started via PEG tube.  Hospitals course was later on complicated by Pseudomonas bacteremia secondary to infected Port-A-Cath.  Patient was transferred to medicine service.  Infectious disease team were consulted who recommended removing Port-A-Cath.    Assessment & Plan:   Principal Problem:   Squamous cell carcinoma of glottis (HCC) Active Problems:   Laryngeal mass   Goals of care, counseling/discussion   Dysphagia   Loss of weight   PSVT (paroxysmal supraventricular tachycardia) (HCC)   Protein-calorie malnutrition, severe   Bacteremia   Leukocytosis   Normocytic anemia   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HCC)   Pleural effusion   Shortness of breath  Pseudomonas bacteremia -Likely source was Port-A-Cath which was removed on 10/14/2018.    Patient seen in consultation by ID who are recommending continuation of IV cefepime for total of 2 weeks post Port-A-Cath removal.  Repeat blood cultures pending with no growth to date.  If repeat blood cultures negative, PICC line can be placed in case if he gets transition to skilled nursing facility in the meantime.  Acute kidney injury secondary to acute urinary retention -Patient had over 1 L of urine in his bladder.  Creatinine as high as 5.3, after placing Foley catheter 10/14/2018 his creatinine has trended down to 0.69.  Continue Foley catheter.   Urine output of 3.360 L over the past 24 hours.  Patient failed voiding trial.  Foley catheter had to be  placed back in.  Will need outpatient follow-up with urology for voiding trial and further evaluation.     Paroxysmal supraventricular tachycardia; intermittent and recurrent -??  Etiology.  Patient with narrow complex tachycardia with a regular rate.  Patient received a dose of adenosine now in sinus rhythm with heart rates in the 90s.  TSH within normal limits.  2D echo normal.  Keep potassium > 4.  Keep magnesium > 2.  Increased dose of metoprolol to 50 mg 4 times daily per G-tube as patient noted to have a 25 beat run of nonsustained V. tach in the early hours of 10/20/2018.  Patient was seen by cardiology who have signed off.   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  Patient with increasing secretions.   Status post G-tube placement 7/13.    Patient currently on tube feeds.  -Due to patient's increased secretions patient was placed on a scopolamine patch.  Some improvement with secretions.  Patient not on Robinul on MAR.   -Nutrition team is following. -Patient being followed by oncology.  Patient was transferred to [facility] for radiation oncology evaluation and to be started on radiation treatment.  Patient underwent simulation on 10/16/2018.  Per radiation oncology patient started radiation treatments on 10/22/2018.  Continue radiation treatments.  Trach care per ENT.  -Patient with complaints to RN that he feels tube feeds are too much and he is getting early satiety.  Will decrease tube feeds to 3 times daily from 5 times daily.  RN to  page dietitian.  Follow.   Shortness of breath/transudative pleural effusion/pneumonia Patient with complaints of shortness of breath on 10/18/2018, which he stated had been intermittent and worse than post trach placement.  Patient is +8.9 L during this hospitalization as of 10/19/2018.  Patient seems to be auto diuresing with a urine output of 1.0 L over  the past 24 hours..  Current weight is 139 pounds (10/21/2018 ) from 140.3 pounds on 10/19/2018. Repeat chest x-ray done 10/18/2018 with concerns for right basilar infiltrate and right pleural effusion.  Ultrasound guided thoracentesis ordered and done on 10/19/2018 with 450 cc of clear fluid removed.  By lights criteria transudative effusion.  Cytology pending.  Cultures negative to date.  Patient with some clinical improvement.Marland Kitchen  Sputum Gram stain and culture pending.  Urine Legionella antigen negative.  Urine pneumococcus antigen negative.  Antibiotics was broadened broadened patient was on IV vancomycin, azithromycin IV cefepime.  IV vancomycin and azithromycin have been discontinued.  Continue IV cefepime.  Strict I's and O's.  Daily weights.  Follow.    Diarrhea Likely secondary to tube feeds.  C. difficile PCR was done which was negative.  Imodium 4 mg x 1 given on 10/19/2018.  Stools with a more softer consistency today per patient and RN.  Patient has been assessed by dietitian and tube feeds adjusted.  Decrease tube feeds to 3 times daily as opposed to 5 times daily.  Continue Imodium as needed.  Follow.     Asymptomatic NSVT  Patient noted per RN to have a 25 beat run of V. tach overnight/early (10/20/2018).  No further episodes noted.  Patient asymptomatic.  Potassium of 4.2.  Magnesium at 2.2.  Metoprolol dose was increased to 50 mg 4 times daily. Follow.     DVT prophylaxis: Heparin Code Status: Full Family Communication: Updated patient. Disposition Plan: To be determined.   Consultants:   Oncology: Dr. Dion Body 09/30/2018  Interventional radiology: Dr. Ardelle Anton 10/02/2018  [facility] hospitalist: Dr. Julian Reil 10/03/2018  General surgery: Dr. Luisa Hart 10/06/2018  Dental surgery: Dr. Valentino Hue 10/08/2018  Cardiology: Dr. Wyline Mood 10/13/2018  Infectious disease: Dr. Daiva Eves 10/13/2018  Radiation oncology  Procedures:   CT soft tissue neck 09/28/2018  CT chest with contrast 09/29/2018  CT abdomen  10/01/2018  Orthopantogram 10/08/2018  Percutaneous placement of gastrostomy tube 10/04/2018 per Dr. Ardelle Anton interventional radiology  Port-A-Cath removal per interventional radiology 10/14/2018  Ultrasound-guided thoracentesis 10/19/2018 per Brayton El, PA--- 450 cc of clear pale yellow fluid removed.  Antimicrobials:   IV cefepime 10/12/2018 >>>> 10/29/2018  IV Levaquin 10/11/2018>>>>> 10/12/2018  IV vancomycin 10/18/2018>>>>>> 10/20/2018  IV azithromycin 10/18/2018>>>> 10/22/2018   Subjective: Patient laying in bed.  Feels shortness of breath improved.  Denies chest pain.  Still with secretions.  No further runs of asymptomatic V. tach.  Loose stools improving.  Per RN patient feels tube feeds are too much and getting full quickly.    Objective: Vitals:   10/23/18 0800 10/23/18 1227 10/23/18 1306 10/23/18 1548  BP:   135/61   Pulse: 78 95 66 70  Resp: 18 18 18 18   Temp:   99 F (37.2 C)   TempSrc:   Oral   SpO2: 100% 97% 97% 99%  Weight:      Height:        Intake/Output Summary (Last 24 hours) at 10/23/2018 1904 Last data filed at 10/23/2018 1801 Gross per 24 hour  Intake 100 ml  Output 3875 ml  Net -3775 ml   [employer]  10/21/18 0640 10/22/18 0505 10/23/18 0705  Weight: 63.3 kg 63 kg 60.9 kg    Examination:  General exam: NAD Respiratory system: Decreased breath sounds in the bases otherwise clear.  No wheezing.  No rhonchi.  Tracheostomy intact.  Secretions noted.  Cardiovascular system: Regular rate and rhythm no murmurs rubs or gallops.  No JVD.  No lower extremity edema. Gastrointestinal system: Abdomen is soft, nontender, nondistended, positive bowel sounds.  No rebound.  No guarding.  Central nervous system: Alert and oriented. No focal neurological deficits. Extremities: Symmetric 5 x 5 power. Skin: No rashes, lesions or ulcers Psychiatry: Judgement and insight appear normal. Mood & affect appropriate.     Data Reviewed: I have personally reviewed  following labs and imaging studies  CBC: Recent Labs  Lab 10/19/18 0516 10/20/18 0515 10/21/18 0450 10/22/18 0417 10/23/18 0438  WBC 11.5* 12.1* 10.7* 11.5* 10.9*  NEUTROABS 7.7 8.7* 7.7  --   --   HGB 9.2* 8.9* 8.7* 8.9* 9.0*  HCT 31.4* 28.7* 29.3* 30.1* 30.4*  MCV 96.3 91.4 93.0 93.5 93.0  PLT 313 329 316 316 328   Basic Metabolic Panel: Recent Labs  Lab 10/17/18 0207 10/18/18 0417 10/19/18 0516 10/20/18 0510 10/20/18 0515 10/21/18 0450 10/22/18 0417 10/23/18 0438  NA 137 138 141  --  140 141 138 138  K 4.2 3.7 4.6  --  4.2 4.5 4.0 4.2  CL 104 107 110  --  106 105 105 104  CO2 25 24 22   --  29 28 29 27   GLUCOSE 129* 99 115*  --  109* 106* 104* 115*  BUN 14 13 13   --  13 16 16 15   CREATININE 0.60* 0.58* 0.59*  --  0.69 0.69 0.63 0.66  CALCIUM 8.1* 7.7* 8.0*  --  8.2* 8.2* 8.3* 8.3*  MG 2.0 1.7 2.1 1.8  --  2.2  --   --    GFR: Estimated Creatinine Clearance: 73 mL/min (by C-G formula based on SCr of 0.66 mg/dL). Liver Function Tests: Recent Labs  Lab 10/19/18 0516  AST 36  ALT 77*  ALKPHOS 82  BILITOT 0.3  PROT 5.2*  ALBUMIN 2.1*   No results for input(s): LIPASE, AMYLASE in the last 168 hours. No results for input(s): AMMONIA in the last 168 hours. Coagulation Profile: No results for input(s): INR, PROTIME in the last 168 hours. Cardiac Enzymes: No results for input(s): CKTOTAL, CKMB, CKMBINDEX, TROPONINI in the last 168 hours. BNP (last 3 results) No results for input(s): PROBNP in the last 8760 hours. HbA1C: No results for input(s): HGBA1C in the last 72 hours. CBG: Recent Labs  Lab 10/23/18 0026 10/23/18 0652 10/23/18 0734 10/23/18 1151 10/23/18 1602  GLUCAP 116* 80 98 107* 99   Lipid Profile: No results for input(s): CHOL, HDL, LDLCALC, TRIG, CHOLHDL, LDLDIRECT in the last 72 hours. Thyroid Function Tests: No results for input(s): TSH, T4TOTAL, FREET4, T3FREE, THYROIDAB in the last 72 hours. Anemia Panel: No results for input(s):  VITAMINB12, FOLATE, FERRITIN, TIBC, IRON, RETICCTPCT in the last 72 hours. Sepsis Labs: No results for input(s): PROCALCITON, LATICACIDVEN in the last 168 hours.  Recent Results (from the past 240 hour(s))  Culture, blood (routine x 2)     Status: None   Collection Time: 10/15/18  7:43 AM   Specimen: BLOOD LEFT WRIST  Result Value Ref Range Status   Specimen Description BLOOD LEFT WRIST  Final   Special Requests   Final    BOTTLES DRAWN AEROBIC ONLY  Blood Culture adequate volume   Culture   Final    NO GROWTH 5 DAYS Performed at [facility], [address] [place], [place] [address]    Report Status 10/20/2018 FINAL  Final  Culture, blood (routine x 2)     Status: None   Collection Time: 10/15/18  7:51 AM   Specimen: BLOOD  Result Value Ref Range Status   Specimen Description BLOOD LEFT ANTECUBITAL  Final   Special Requests   Final    BOTTLES DRAWN AEROBIC ONLY Blood Culture adequate volume   Culture   Final    NO GROWTH 5 DAYS Performed at [facility], [address], [place], [place] [address]    Report Status 10/20/2018 FINAL  Final  MRSA PCR Screening     Status: None   Collection Time: 10/15/18  6:17 PM   Specimen: Nasopharyngeal  Result Value Ref Range Status   MRSA by PCR NEGATIVE NEGATIVE Final    Comment:        The GeneXpert MRSA Assay (FDA approved for NASAL specimens only), is one component of a comprehensive MRSA colonization surveillance program. It is not intended to diagnose MRSA infection nor to guide or monitor treatment for MRSA infections. Performed at [facility], [address] [place], [place] [address]   SARS Coronavirus 2 (CEPHEID - Performed in [facility] hospital lab), Hosp Order     Status: None   Collection Time: 10/18/18  5:01 PM   Specimen: Nasopharyngeal Swab  Result Value Ref Range Status   SARS Coronavirus 2 NEGATIVE NEGATIVE Final    Comment: (NOTE) If result is NEGATIVE SARS-CoV-2 target  nucleic acids are NOT DETECTED. The SARS-CoV-2 RNA is generally detectable in upper and lower  respiratory specimens during the acute phase of infection. The lowest  concentration of SARS-CoV-2 viral copies this assay can detect is 250  copies / mL. A negative result does not preclude SARS-CoV-2 infection  and should not be used as the sole basis for treatment or other  patient management decisions.  A negative result may occur with  improper specimen collection / handling, submission of specimen other  than nasopharyngeal swab, presence of viral mutation(s) within the  areas targeted by this assay, and inadequate number of viral copies  (<250 copies / mL). A negative result must be combined with clinical  observations, patient history, and epidemiological information. If result is POSITIVE SARS-CoV-2 target nucleic acids are DETECTED. The SARS-CoV-2 RNA is generally detectable in upper and lower  respiratory specimens dur ing the acute phase of infection.  Positive  results are indicative of active infection with SARS-CoV-2.  Clinical  correlation with patient history and other diagnostic information is  necessary to determine patient infection status.  Positive results do  not rule out bacterial infection or co-infection with other viruses. If result is PRESUMPTIVE POSTIVE SARS-CoV-2 nucleic acids MAY BE PRESENT.   A presumptive positive result was obtained on the submitted specimen  and confirmed on repeat testing.  While 2019 novel coronavirus  (SARS-CoV-2) nucleic acids may be present in the submitted sample  additional confirmatory testing may be necessary for epidemiological  and / or clinical management purposes  to differentiate between  SARS-CoV-2 and other Sarbecovirus currently known to infect humans.  If clinically indicated additional testing with an alternate test  methodology [phone]) is advised. The SARS-CoV-2 RNA is generally  detectable in upper and lower  respiratory sp ecimens during the acute  phase  of infection. The expected result is Negative. Fact Sheet for Patients:  BoilerBrush.com.cy Fact Sheet for Healthcare Providers: https://pope.com/ This test is not yet approved or cleared by the [place] FDA and has been authorized for detection and/or diagnosis of SARS-CoV-2 by FDA under an Emergency Use Authorization (EUA).  This EUA will remain in effect (meaning this test can be used) for the duration of the COVID-19 declaration under Section 564(b)(1) of the Act, 21 U.S.C. section 360bbb-3(b)(1), unless the authorization is terminated or revoked sooner. Performed at [facility], [address], [place], [place] [address]   C difficile quick scan w PCR reflex     Status: None   Collection Time: 10/18/18  8:32 PM   Specimen: STOOL  Result Value Ref Range Status   C Diff antigen NEGATIVE NEGATIVE Final   C Diff toxin NEGATIVE NEGATIVE Final   C Diff interpretation No C. difficile detected.  Final    Comment: Performed at [facility], [address] [place], [place] [address]  Culture, body fluid-bottle     Status: None (Preliminary result)   Collection Time: 10/19/18  3:20 PM   Specimen: Pleura  Result Value Ref Range Status   Specimen Description PLEURAL  Final   Special Requests NONE  Final   Culture   Final    NO GROWTH 4 DAYS Performed at [facility], [address] [place], [place] [address]    Report Status PENDING  Incomplete  Gram stain     Status: None   Collection Time: 10/19/18  3:20 PM   Specimen: Pleura  Result Value Ref Range Status   Specimen Description PLEURAL  Final   Special Requests NONE  Final   Gram Stain   Final    RARE WBC PRESENT, PREDOMINANTLY PMN NO ORGANISMS SEEN Performed at [facility], [address], [place], [place] [address]    Report Status 10/20/2018 FINAL  Final          Radiology Studies: No results found.      Scheduled Meds: . amLODipine  5 mg Per Tube Daily  . chlorhexidine  15 mL Mouth Rinse BID  . doxazosin  1 mg Per Tube Daily  . free water  150 mL Per Tube Q4H  . heparin injection (subcutaneous)  5,000 Units Subcutaneous Q8H  . hydrocortisone   Rectal QID  . Molli Posey Peptide 1.5  325 mL Oral TID  . lidocaine  1 application Urethral Once  . mouth rinse  15 mL Mouth Rinse q12n4p  . metoprolol tartrate  50 mg Per Tube QID  . pantoprazole sodium  40 mg Per Tube Daily  . scopolamine  1 patch Transdermal Q72H  . simethicone  160 mg Per Tube QID  . simvastatin  20 mg Per Tube q1800   Continuous Infusions: . sodium chloride Stopped (10/19/18 1736)  . ceFEPime (MAXIPIME) IV 2 g (10/23/18 1321)  . lactated ringers 10 mL/hr at 10/07/18 0859  . sodium chloride 500 mL/hr at 10/14/18 2049     LOS: 26 days    Time spent: 40 minutes    Ramiro Harvest, MD [facility]  If 7PM-7AM, please contact night-coverage [email or web address] 10/23/2018, 7:04 PM

## 2018-10-23 NOTE — Progress Notes (Signed)
Nutrition Follow-up  DOCUMENTATION CODES:   Severe malnutrition in context of chronic illness  INTERVENTION:  - continue 1 can [employer] Peptide 1.5 x5/day and 150 ml free water x6/day. - continue PRN imodium. - provide TF boluses more slowly, over 10-15 minutes. - RD contacted manufacturer rep to obtain more cans of [facility] Peptide 1.5.   NUTRITION DIAGNOSIS:   Severe Malnutrition related to chronic illness, cancer and cancer related treatments as evidenced by percent weight loss, severe muscle depletion, moderate muscle depletion, mild fat depletion. -ongoing  GOAL:   Patient will meet greater than or equal to 90% of their needs -met with TF regimen  MONITOR:   Labs, I & O's, Skin, TF tolerance, Weight trends  ASSESSMENT:   Patient with PMH significant for diverticulitis, gastritis, HLD, HTN, laryngeal cancer, and esophogeal dysphagia. 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  On Monday (7/27) this RD changed TF regimen to 1 can [facility] Peptide 1.5 x5/day and 150 ml free water x6/day. This regimen provides 2500 kcal, 120 grams protein, and 15 grams of fiber; 900 ml free water from water flushes. Spoke with RN and patient at bedside as RN was infusing TF bolus. She confirms that TF is room temperature. Bolus was given over a few minutes; encouraged RN to infuse TF bolus more slowly, over 10-15 minutes, as quick infusion can often lead to nausea and to diarrhea (dumping-like episodes).   Patient feels that stools are about the same. RN reports that patient had 2 liquid stools during day shift yesterday and that patient was given imodium x1 yesterday. She does not know for certainty about stools during night shift. Patient denies any nausea or feelings of overt fullness prior to RD leaving the room.   Port-a-cath was removed on 7/20 and plan is for 2 weeks of IV cefepime from that date. Antibiotics can also  lead to diarrhea/liquid stools and should be taken into consideration when evaluating stools.   Labs reviewed; CBGs: 116, 80, and 98 mg/dl today, Ca: 8.3 mg/dl.  Medications reviewed; 2 g IV cefepime TID x34 doses starting 7/24.    Diet Order:   Diet Order            Diet NPO time specified Except for: Sips with Meds  Diet effective midnight        Diet general              EDUCATION NEEDS:   Not appropriate for education at this time  Skin:  Skin Assessment: Skin Integrity Issues: Skin Integrity Issues:: Incisions, Other (Comment) Incisions: neck/abdomen, R chest (7/20) Other: new wound-sacrum  Last BM:  flow sheet indicates 1 type 6 on 7/27 and 1 type 7 on 7/28  Height:   Ht Readings from Last 1 Encounters:  09/27/18 5' 9"  (1.753 m)    Weight:   Wt Readings from Last 1 Encounters:  10/23/18 60.9 kg    Ideal Body Weight:  72.7 kg  BMI:  Body mass index is 19.83 kg/m.  Estimated Nutritional Needs:   Kcal:  2300-2500 kcal  Protein:  115-130 grams  Fluid:  >/= 2.3 L/day     Jarome Matin, MS, RD, LDN, [facility] Inpatient Clinical Dietitian Pager # [phone] After hours/weekend pager # [phone]

## 2018-10-23 NOTE — Progress Notes (Signed)
Brief Oncology Note:  Chart reviewed. Remains hospitalized due to Pseudomonas bacteremia and SVT. No chemotherapy is planned due to bacteremia and declining performance status. He continues on Radiation for treatment of his laryngeal cancer.   Medical Oncology will sign off. Please contact us for questions.  Mikey Bussing, DNP, AGPCNP-BC, AOCNP

## 2018-10-23 NOTE — Plan of Care (Signed)
  Problem: Education: Goal: Knowledge of General Education information will improve Description: Including pain rating scale, medication(s)/side effects and non-pharmacologic comfort measures 10/23/2018 1529 by Zadie Rhine, RN Outcome: Progressing 10/23/2018 1518 by Zadie Rhine, RN Outcome: Progressing   Problem: Health Behavior/Discharge Planning: Goal: Ability to manage health-related needs will improve 10/23/2018 1529 by Zadie Rhine, RN Outcome: Progressing 10/23/2018 1518 by Zadie Rhine, RN Outcome: Progressing   Problem: Clinical Measurements: Goal: Ability to maintain clinical measurements within normal limits will improve Outcome: Progressing Goal: Diagnostic test results will improve Outcome: Progressing

## 2018-10-23 NOTE — Plan of Care (Signed)

## 2018-10-23 NOTE — Progress Notes (Addendum)
SLP Cancellation Note  Patient Details Name: Logan Price MRN: [id number] DOB: October 28, 1945   Cancelled treatment:       Reason Eval/Treat Not Completed: (pt heading to radiation, will continue efforts)   Macario Golds 10/23/2018, 5:48 PM  Luanna Salk, [facility] SLP Acute Rehab Services Pager [phone] Office [phone]

## 2018-10-24 ENCOUNTER — Ambulatory Visit
Admit: 2018-10-24 | Discharge: 2018-10-24 | Disposition: A | Discharge status: Home / Self Care | Payer: Medicare Other | Attending: Radiation Oncology | Admitting: Radiation Oncology

## 2018-10-24 ENCOUNTER — Ambulatory Visit: Payer: Medicare Other | Admitting: Family

## 2018-10-24 LAB — BASIC METABOLIC PANEL
Anion gap: 9 (ref 5–15)
BUN: 15 mg/dL (ref 8–23)
CO2: 29 mmol/L (ref 22–32)
Calcium: 8.8 mg/dL — ABNORMAL LOW (ref 8.9–10.3)
Chloride: 100 mmol/L (ref 98–111)
Creatinine, Ser: 0.74 mg/dL (ref 0.61–1.24)
GFR calc Af Amer: >60 mL/min (ref >60)
GFR calc non Af Amer: >60 mL/min (ref >60)
Glucose, Bld: 104 mg/dL — ABNORMAL HIGH (ref 70–99)
Potassium: 4.3 mmol/L (ref 3.5–5.1)
Sodium: 138 mmol/L (ref 135–145)

## 2018-10-24 LAB — CBC
HCT: 31.2 % — ABNORMAL LOW (ref 39.0–52.0)
Hemoglobin: 9.7 g/dL — ABNORMAL LOW (ref 13.0–17.0)
MCH: 28.5 pg (ref 26.0–34.0)
MCHC: 31.1 g/dL (ref 30.0–36.0)
MCV: 91.8 fL (ref 80.0–100.0)
Platelets: 294 10*3/uL (ref 150–400)
RBC: 3.4 MIL/uL — ABNORMAL LOW (ref 4.22–5.81)
RDW: 15.3 % (ref 11.5–15.5)
WBC: 9.7 10*3/uL (ref 4.0–10.5)
nRBC: 0 % (ref 0.0–0.2)

## 2018-10-24 LAB — CULTURE, BODY FLUID W GRAM STAIN -BOTTLE: Culture: NO GROWTH

## 2018-10-24 LAB — GLUCOSE, CAPILLARY
Glucose-Capillary: 98 mg/dL (ref 70–99)
Glucose-Capillary: 84 mg/dL (ref 70–99)
Glucose-Capillary: 94 mg/dL (ref 70–99)
Glucose-Capillary: 112 mg/dL — ABNORMAL HIGH (ref 70–99)
Glucose-Capillary: 130 mg/dL — ABNORMAL HIGH (ref 70–99)
Glucose-Capillary: 103 mg/dL — ABNORMAL HIGH (ref 70–99)

## 2018-10-24 MED ORDER — KATE FARMS PEPTIDE 1.5 PO LIQD
325.0000 mL | ORAL | Status: DC
  Administered 2018-10-24: 30 mL via GASTROSTOMY
  Administered 2018-10-25 (×2): 325 mL via GASTROSTOMY
  Administered 2018-10-26 – 2018-10-27 (×3): via GASTROSTOMY
  Administered 2018-10-27 – 2018-11-04 (×14): 325 mL via GASTROSTOMY
  Administered 2018-11-04: 650 mL via GASTROSTOMY
  Administered 2018-11-05 – 2018-11-07 (×4): 325 mL via GASTROSTOMY
  Filled 2018-10-24 (×5): qty 650

## 2018-10-24 MED ORDER — PRO-STAT SUGAR FREE PO LIQD
30.0000 mL | Freq: Two times a day (BID) | ORAL | Status: DC
  Administered 2018-10-24 – 2018-11-08 (×28): 30 mL
  Filled 2018-10-24 (×28): qty 30

## 2018-10-24 MED ORDER — FREE WATER
200.0000 mL | Status: DC
  Administered 2018-10-24 – 2018-11-08 (×79): 200 mL

## 2018-10-24 NOTE — Progress Notes (Signed)
OT Cancellation Note  Patient Details Name: Logan Price MRN: [id number] DOB: 05-22-1945   Cancelled Treatment:    Reason Eval/Treat Not Completed: Other (comment). Pt declining any OOB activity with OT.  States he is waiting for XRT, and he doesn't know when it will be.  He doesn't want to do anything in the meantime. Will try once more at a morning time to see if this is any better.  Pt has had multiple refusals with OT.  [place] 10/24/2018, 1:21 PM  Lesle Chris, OTR/L Acute Rehabilitation Services [phone] [facility] pager [phone] office 10/24/2018

## 2018-10-24 NOTE — Progress Notes (Signed)
Physical Therapy Treatment Patient Details Name: Logan Price MRN: [id number] DOB: 1945/08/13 Today's Date: 10/24/2018    History of Present Illness Pt is a 73 yo male admitted for planned trach and laryngeal biopsy 7/2. 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 Esophagram 6/26 showed silent gross aspiration of barium. PMH includes: HLD, HTN, hepatitis C, GERD, diverticulitis    PT Comments    Pt assisted with ambulating in hallway.  Pt last seen by PT about a week ago due to cancel refusals.  Pt may benefit from ambulating with nursing if possible, as he seems to cancel around his radiation times.   Follow Up Recommendations  Home health PT     Equipment Recommendations  Rolling walker with 5" wheels    Recommendations for Other Services       Precautions / Restrictions Precautions Precautions: Fall Precaution Comments: increased trach secretions; Gtube Restrictions Weight Bearing Restrictions: No    Mobility  Bed Mobility Overal bed mobility: Modified Independent                Transfers Overall transfer level: Needs assistance Equipment used: None Transfers: Sit to/from Stand;Stand Pivot Transfers Sit to Stand: Min guard Stand pivot transfers: Min guard       General transfer comment: assist for safety, moving lines so he can get to [facility]  Ambulation/Gait Ambulation/Gait assistance: Min assist;Min guard Gait Distance (Feet): 400 Feet Assistive device: Rolling walker (2 wheeled) Gait Pattern/deviations: Step-through pattern;Decreased stride length     General Gait Details: initial assist for balance, tolerated good distance, remained on 6L trach collar, SpO2 98% upon returning to room   Stairs             Wheelchair Mobility    Modified Rankin (Stroke Patients Only)       Balance Overall balance assessment: Needs assistance         Standing balance support: Single  extremity supported Standing balance-Leahy Scale: Poor Standing balance comment: requires UE support for static standing                            Cognition Arousal/Alertness: Awake/alert Behavior During Therapy: WFL for tasks assessed/performed Overall Cognitive Status: Within Functional Limits for tasks assessed                                        Exercises      General Comments General comments (skin integrity, edema, etc.): Pt did not have in PMSV on arrival and did not apply working with therapist.      Pertinent Vitals/Pain Pain Assessment: No/denies pain    Home Living                      Prior Function            PT Goals (current goals can now be found in the care plan section) Progress towards PT goals: Progressing toward goals    Frequency    Min 3X/week      PT Plan Current plan remains appropriate    Co-evaluation              AM-PAC PT "6 Clicks" Mobility   Outcome Measure  Help needed turning from your back to your side while in a flat bed without using bedrails?:  None Help needed moving from lying on your back to sitting on the side of a flat bed without using bedrails?: A Little Help needed moving to and from a bed to a chair (including a wheelchair)?: A Little Help needed standing up from a chair using your arms (e.g., wheelchair or bedside chair)?: A Little Help needed to walk in hospital room?: A Little Help needed climbing 3-5 steps with a railing? : A Lot 6 Click Score: 18    End of Session Equipment Utilized During Treatment: Oxygen Activity Tolerance: Patient tolerated treatment well Patient left: with call bell/phone within reach;in bed;with bed alarm set Nurse Communication: Mobility status PT Visit Diagnosis: Other abnormalities of gait and mobility (R26.89);Muscle weakness (generalized) (M62.81)     Time: 3664-4034 PT Time Calculation (min) (ACUTE ONLY): 24 min  Charges:  $Gait  Training: 8-22 mins $Therapeutic Activity: 8-22 mins                    Zenovia Jarred, PT, DPT Acute Rehabilitation Services Office: [phone] Pager: [phone]  Sarajane Jews 10/24/2018, 1:24 PM

## 2018-10-24 NOTE — Progress Notes (Signed)
[facility]                                                                              Patient Demographics  Logan Price, is a 73 y.o. male, DOB - 25-Jul-1945, WGN:[id number]  Admit date - 09/27/2018   Admitting Physician [name], MD  Outpatient Primary MD for the patient is [name], FNP  Outpatient specialists:   LOS - 27  days   Medical records reviewed and are as summarized below:    No chief complaint on file.      Brief summary  73 year old with history of GERD, essential hypertension, hyperlipidemia who recently diagnosed neck mass concerning for malignancy was admitted to the hospital. Overnight patient went into SVT therefore medical team was consulted. Patient was given adenosine x1 which converted back to normal sinus rhythm. G-tube placed by surgery 7/13, tolerating tube feeds. Metoprolol was started via PEG tube. Hospitals course was later on complicated by Pseudomonas bacteremia secondary to infected Port-A-Cath. Patient was transferred to medicine service. Infectious disease team were consulted who recommended removing Port-A-Cath.    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 -Oncology following, patient was transferred to [facility] long for radiation oncology evaluation, underwent simulation on 7/22, started XRT on 7/28.  Trach care per ENT.  Active problems Pseudomonas bacteremia -Likely source Port-A-Cath, removed on 10/14/2018. -ID consulted recommended continue  IV cefepime for total of 2 weeks post Port-A-Cath removal -Repeat blood cultures negative till date -PICC line can be placed in case patient is transition to SNF, social work consult placed  Acute kidney injury secondary to acute urinary retention -Patient had over 1 L of urine in his bladder, creatinine 5.3 on 7/20, after placing Foley catheter, creatinine has been steadily improving -Creatinine 0.7 today, continue Foley catheter, had failed voiding trial and Foley catheter had to be placed back in -Patient will need urology outpatient follow-up for voiding trial and further evaluation.  Paroxysmal SVT, intermittent and recurrent -Patient had 25 beats run of nonsustained V. tach on 10/20/2018 a.m. -Patient was seen by cardiology, currently signed off.  2D echo normal, TSH within normal limits -Keep potassium ~4, magnesium above 2   Transudative pleural effusions, pneumonia, shortness of breath -Patient had complained of dyspnea on 7/24, intermittent, noticed to be 8.9 L positive during hospitalization.  Chest x-ray showed a right-sided pleural effusion with concerns for right basilar infiltrate -Ultrasound-guided thoracentesis done on 7/25 with 450 cc of clear fluid removed, transudative -Urine Legionella antigen negative, urine strep antigen negative -IV vancomycin, Zithromax has been discontinued.  Continue IV cefepime  Diarrhea -Likely secondary to tube feeds, C. difficile PCR negative -RD adjusting tube feeds regimen  Code Status: Full CODE STATUS DVT Prophylaxis: Heparin subcu  Family Communication: Discussed in detail with the patient, all imaging results, lab results explained to the patient   Disposition Plan: Pending skilled nursing facility placement, social work consult placed  Time Spent in minutes 35 minutes  Procedures:   Percutaneous placement of gastrostomy tube 10/04/2018 per Dr. Ardelle Anton interventional radiology  Port-A-Cath removal per interventional radiology  10/14/2018  Ultrasound-guided thoracentesis 10/19/2018 per Brayton El, PA--- 450 cc of clear pale yellow fluid removed.  Consultants:    Oncology: Dr. Dion Body 09/30/2018  Interventional radiology: Dr. Ardelle Anton 10/02/2018  [facility]: Dr. Julian Reil 10/03/2018  General surgery: Dr. Luisa Hart 10/06/2018  Dental surgery: Dr. Valentino Hue 10/08/2018  Cardiology: Dr. Wyline Mood 10/13/2018  Infectious disease: Dr. Daiva Eves 10/13/2018  Radiation oncology    Antimicrobials:   Anti-infectives (From admission, onward)   Start     Dose/Rate Route Frequency Ordered Stop   10/19/18 0400  vancomycin (VANCOCIN) IVPB 750 mg/150 ml premix  Status:  Discontinued     750 mg 150 mL/hr over 60 Minutes Intravenous Every 12 hours 10/18/18 1541 10/20/18 1006   10/18/18 2000  ceFEPIme (MAXIPIME) 2 g in sodium chloride 0.9 % 100 mL IVPB     2 g 200 mL/hr over 30 Minutes Intravenous Every 8 hours 10/18/18 1243 10/30/18 0559   10/18/18 1600  azithromycin (ZITHROMAX) 500 mg in sodium chloride 0.9 % 250 mL IVPB     500 mg 250 mL/hr over 60 Minutes Intravenous Every 24 hours 10/18/18 1438 10/22/18 2252   10/18/18 1600  vancomycin (VANCOCIN) 1,250 mg in sodium chloride 0.9 % 250 mL IVPB     1,250 mg 166.7 mL/hr over 90 Minutes Intravenous  Once 10/18/18 1516 10/19/18 0100   10/15/18 1145  ceFEPIme (MAXIPIME) 2 g in sodium chloride 0.9 % 100 mL IVPB  Status:  Discontinued     2 g 200 mL/hr over 30 Minutes Intravenous Every 12 hours 10/15/18 1135 10/18/18 1243   10/14/18 1100  ceFEPIme (MAXIPIME) 2 g in sodium chloride 0.9 % 100 mL IVPB  Status:  Discontinued     2 g 200 mL/hr over 30 Minutes Intravenous Every 24 hours 10/13/18 1248 10/15/18 1135   10/14/18 0000  ceFAZolin (ANCEF) IVPB 2g/100 mL premix     2 g 200 mL/hr over 30 Minutes Intravenous To Radiology 10/13/18 1216 10/14/18 0907   10/12/18 0445  ceFEPIme (MAXIPIME) 2 g in sodium chloride 0.9 % 100 mL IVPB  Status:  Discontinued     2 g 200 mL/hr over 30  Minutes Intravenous Every 12 hours 10/12/18 0439 10/13/18 1248   10/11/18 1100  levofloxacin (LEVAQUIN) IVPB 750 mg  Status:  Discontinued     750 mg 100 mL/hr over 90 Minutes Intravenous Every 24 hours 10/11/18 0956 10/12/18 0439   10/04/18 1330  ceFAZolin (ANCEF) powder 1 g  Status:  Discontinued     1 g Other To Surgery 10/04/18 1317 10/04/18 1454   10/04/18 1315  ceFAZolin (ANCEF) 2-4 GM/100ML-% IVPB    Note to Pharmacy: Unk Lightning  : cabinet override      10/04/18 1315 10/04/18 1317   10/03/18 0800  ceFAZolin (ANCEF) IVPB 2g/100 mL premix     2 g 200 mL/hr over 30 Minutes Intravenous To Radiology 10/02/18 0901 10/04/18 1346   09/27/18 1500  clindamycin (CLEOCIN) IVPB 600 mg     600 mg 100 mL/hr over 30 Minutes Intravenous Every 8 hours 09/27/18 1400 09/28/18 0644          Medications  Scheduled Meds:  amLODipine  5 mg Per Tube Daily   chlorhexidine  15 mL Mouth Rinse BID   doxazosin  1 mg Per Tube Daily   feeding supplement (PRO-STAT SUGAR FREE 64)  30 mL Per Tube BID   free water  200 mL Per Tube Q4H   heparin injection (subcutaneous)  5,000 Units Subcutaneous Q8H   hydrocortisone   Rectal QID   lidocaine  1 application Urethral Once   mouth rinse  15 mL Mouth Rinse q12n4p   metoprolol tartrate  50 mg Per Tube QID   pantoprazole sodium  40 mg Per Tube Daily   scopolamine  1 patch Transdermal Q72H   simethicone  160 mg Per Tube QID   simvastatin  20 mg Per Tube q1800   Continuous Infusions:  sodium chloride Stopped (10/19/18 1736)   ceFEPime (MAXIPIME) IV 2 g (10/24/18 1440)   Molli Posey Peptide 1.5     lactated ringers 10 mL/hr at 10/07/18 0859   sodium chloride 500 mL/hr at 10/14/18 2049   PRN Meds:.sodium chloride, alum & mag hydroxide-simeth, hydrALAZINE, hydrocortisone, hydrocortisone cream, ipratropium-albuterol, lip balm, loperamide HCl, loratadine, metoprolol tartrate, morphine injection, Muscle Rub, ondansetron (ZOFRAN) IV,  phenol, polyethylene glycol, polyvinyl alcohol, promethazine, senna-docusate, sodium chloride      Subjective:   Logan Price was seen and examined today.  No complaints per patient, laying on the bed, no worsening of shortness of breath.  No chest pain.  Still having diarrhea, feeling full too quickly. No fevers.   Objective:   Vitals:   10/24/18 0806 10/24/18 1114 10/24/18 1517 10/24/18 1545  BP:   125/69   Pulse: 64 82 73 78  Resp: 20  (!) 22 20  Temp:   99.1 F (37.3 C)   TempSrc:      SpO2: 98% 99% 94% 97%  Weight:      Height:        Intake/Output Summary (Last 24 hours) at 10/24/2018 1629 Last data filed at 10/24/2018 1052 Gross per 24 hour  Intake 758 ml  Output 2500 ml  Net -1742 ml     Wt Readings from Last 3 Encounters:  10/24/18 58.8 kg  09/11/18 63.2 kg  04/25/18 70.4 kg     Exam  General: Alert and oriented x 3, NAD  Eyes:   HEENT: Trach+  Cardiovascular: S1 S2 auscultated, Regular rate and rhythm.  Respiratory: No wheezing or rhonchi, secretions noted  Gastrointestinal: Soft, nontender, nondistended, + bowel sounds  Ext: no pedal edema bilaterally  Neuro: No new deficits  Musculoskeletal: No digital cyanosis, clubbing  Skin: No rashes  Psych: Normal affect and demeanor, alert and oriented x3    Data Reviewed:  I have personally reviewed following labs and imaging studies  Micro Results Recent Results (from the past 240 hour(s))  Culture, blood (routine x 2)     Status: None   Collection Time: 10/15/18  7:43 AM   Specimen: BLOOD LEFT WRIST  Result Value Ref Range Status   Specimen Description BLOOD LEFT WRIST  Final   Special Requests   Final    BOTTLES DRAWN AEROBIC ONLY Blood Culture adequate volume   Culture   Final    NO GROWTH 5 DAYS Performed at [facility], [address], [place], [place] [address]    Report Status 10/20/2018 FINAL  Final  Culture, blood (routine x 2)     Status: None   Collection Time:  10/15/18  7:51 AM   Specimen: BLOOD  Result Value Ref Range Status   Specimen Description BLOOD LEFT ANTECUBITAL  Final   Special Requests   Final    BOTTLES DRAWN AEROBIC ONLY Blood Culture adequate volume   Culture   Final    NO GROWTH 5 DAYS Performed at [facility], [address], [place], [place] [address]    Report Status 10/20/2018 FINAL  Final  MRSA PCR Screening     Status: None   Collection Time: 10/15/18  6:17 PM   Specimen: Nasopharyngeal  Result Value Ref Range Status   MRSA by PCR NEGATIVE NEGATIVE Final    Comment:        The GeneXpert MRSA Assay (FDA approved for NASAL specimens only), is one component of a comprehensive MRSA colonization surveillance program. It is not intended to diagnose MRSA infection nor to guide or monitor treatment for MRSA infections. Performed at [facility], [address] [place], [place] [address]   SARS Coronavirus 2 (CEPHEID - Performed in [facility] hospital lab), Hosp Order     Status: None   Collection Time: 10/18/18  5:01 PM   Specimen: Nasopharyngeal Swab  Result Value Ref Range Status   SARS Coronavirus 2 NEGATIVE NEGATIVE Final    Comment: (NOTE) If result is NEGATIVE SARS-CoV-2 target nucleic acids are NOT DETECTED. The SARS-CoV-2 RNA is generally detectable in upper and lower  respiratory specimens during the acute phase of infection. The lowest  concentration of SARS-CoV-2 viral copies this assay can detect is 250  copies / mL. A negative result does not preclude SARS-CoV-2 infection  and should not be used as the sole basis for treatment or other  patient management decisions.  A negative result may occur with  improper specimen collection / handling, submission of specimen other  than nasopharyngeal swab, presence of viral mutation(s) within the  areas targeted by this assay, and inadequate number of viral copies  (<250 copies / mL). A negative result must be combined with clinical    observations, patient history, and epidemiological information. If result is POSITIVE SARS-CoV-2 target nucleic acids are DETECTED. The SARS-CoV-2 RNA is generally detectable in upper and lower  respiratory specimens dur ing the acute phase of infection.  Positive  results are indicative of active infection with SARS-CoV-2.  Clinical  correlation with patient history and other diagnostic information is  necessary to determine patient infection status.  Positive results do  not rule out bacterial infection or co-infection with other viruses. If result is PRESUMPTIVE POSTIVE SARS-CoV-2 nucleic acids MAY BE PRESENT.   A presumptive positive result was obtained on the submitted specimen  and confirmed on repeat testing.  While 2019 novel coronavirus  (SARS-CoV-2) nucleic acids may be present in the submitted sample  additional confirmatory testing may be necessary for epidemiological  and / or clinical management purposes  to differentiate between  SARS-CoV-2 and other Sarbecovirus currently known to infect humans.  If clinically indicated additional testing with an alternate test  methodology [phone]) is advised. The SARS-CoV-2 RNA is generally  detectable in upper and lower respiratory sp ecimens during the acute  phase of infection. The expected result is Negative. Fact Sheet for Patients:  BoilerBrush.com.cy Fact Sheet for Healthcare Providers: https://pope.com/ This test is not yet approved or cleared by the [place] FDA and has been authorized for detection and/or diagnosis of SARS-CoV-2 by FDA under an Emergency Use Authorization (EUA).  This EUA will remain in effect (meaning this test can be used) for  the duration of the COVID-19 declaration under Section 564(b)(1) of the Act, 21 U.S.C. section 360bbb-3(b)(1), unless the authorization is terminated or revoked sooner. Performed at [facility], [address], [place], [place] [address]   C difficile quick scan w PCR reflex     Status: None   Collection Time: 10/18/18  8:32 PM   Specimen: STOOL  Result Value Ref Range Status   C Diff antigen NEGATIVE NEGATIVE Final   C Diff toxin NEGATIVE NEGATIVE Final   C Diff interpretation No C. difficile detected.  Final    Comment: Performed at [facility], [address], [place], [place] [address]  Culture, body fluid-bottle     Status: None   Collection Time: 10/19/18  3:20 PM   Specimen: Pleura  Result Value Ref Range Status   Specimen Description PLEURAL  Final   Special Requests NONE  Final   Culture   Final    NO GROWTH 5 DAYS Performed at [facility], [address], [place], [place] [address]    Report Status 10/24/2018 FINAL  Final  Gram stain     Status: None   Collection Time: 10/19/18  3:20 PM   Specimen: Pleura  Result Value Ref Range Status   Specimen Description PLEURAL  Final   Special Requests NONE  Final   Gram Stain   Final    RARE WBC PRESENT, PREDOMINANTLY PMN NO ORGANISMS SEEN Performed at [facility], [address] [place], [place] [address]    Report Status 10/20/2018 FINAL  Final    Radiology Reports Ct Abdomen Wo Contrast  Result Date: 10/01/2018 CLINICAL DATA:  73 year old male undergoing evaluation for gastrostomy tube placement. EXAM: CT ABDOMEN WITHOUT CONTRAST TECHNIQUE: Multidetector CT imaging of the abdomen was performed following the standard protocol without IV contrast. COMPARISON:  Prior abdominal ultrasound 12/25/2016 FINDINGS: Lower chest: Mild lower lobe bronchial wall thickening. Trace atelectasis versus infiltrate in the dependent portion of the right lower lobe. No suspicious pulmonary nodule or mass. The visualized heart is normal in size. Unremarkable distal thoracic esophagus. Hepatobiliary: Normal hepatic contour and morphology. No discrete hepatic lesion. The gallbladder lumen is filled with high  density material likely representing sludge and/or small stones. No biliary ductal dilatation. Pancreas: Unremarkable. No pancreatic ductal dilatation or surrounding inflammatory changes. Spleen: Normal in size without focal abnormality. Adrenals/Urinary Tract: Normal adrenal glands. Circumscribed low-attenuation lesions within the right kidney are incompletely characterized in the absence of intravenous contrast. However, a sonographically simple cyst was previously identified in the lower pole. No evidence of hydronephrosis or nephrolithiasis. Stomach/Bowel: High-riding transverse colon which lies anterior to the gastric body and antrum. Otherwise, normal gastric anatomy. No focal bowel wall thickening or evidence of obstruction. Vascular/Lymphatic: Limited evaluation in the absence of intravenous contrast. Extensive atherosclerotic vascular calcifications. No aneurysm. Other: No ascites or abdominal wall hernia. Musculoskeletal: No acute fracture or aggressive appearing lytic or blastic osseous lesion. IMPRESSION: 1. High-riding transverse colon which lies anterior to the gastric body and antrum. Percutaneous gastrostomy tube placement may be possible, however will require good visualization of the transverse colon at the time of attempted procedure. Consider administration of barium prior to the procedure. 2.  Aortic Atherosclerosis (ICD10-170.0). 3. Right lower lobe atelectasis versus changes of small volume aspiration. 4. Sludge and/or small stones in the gallbladder lumen. Electronically Signed   By: Malachy Moan M.D.   On: 10/01/2018 14:18   Dg Orthopantogram  Result Date: 10/08/2018  CLINICAL DATA:  Poor dentition. Preoperative for laryngeal carcinoma. EXAM: ORTHOPANTOGRAM/PANORAMIC COMPARISON:  None. FINDINGS: Missing LEFT maxillary teeth from a first bicuspid through distal molar. Missing RIGHT maxillary teeth from second bicuspid through distal molar. Missing RIGHT mandibular teeth from first  through distal molar. Missing LEFT mandibular first molar. No periapical lucency to suggest abscess. No definite caries. IMPRESSION: 1. Numerous missing mandibular and maxillary teeth, as detailed above. 2. No evidence of periapical dental abscess. Electronically Signed   By: Bary Richard M.D.   On: 10/08/2018 13:27   Dg Chest 1 View  Result Date: 10/19/2018 CLINICAL DATA:  S/P right thoracentesis. EXAM: CHEST  1 VIEW COMPARISON:  10/18/2018 FINDINGS: Tracheostomy tube is unchanged. The heart size is normal. Small RIGHT pleural effusion, decreased since prior study. No pneumothorax. Minimal bibasilar atelectasis. IMPRESSION: Decreased RIGHT pleural effusion. No pneumothorax. Electronically Signed   By: Norva Pavlov M.D.   On: 10/19/2018 15:43   Dg Abd 1 View  Result Date: 10/05/2018 CLINICAL DATA:  Is a gastric tube placement. EXAM: ABDOMEN - 1 VIEW COMPARISON:  October 04, 2018 FINDINGS: Nonobstructive bowel gas pattern. Residual contrast throughout the colon and in the appendix. Nonobstructive bowel gas pattern. Enteric catheter overlies the expected location of the gastric cardia with side hole at the expected location of the GE junction. IMPRESSION: Enteric catheter overlies the expected location of the gastric cardia with side hole at the expected location of the GE junction. Electronically Signed   By: Ted Mcalpine M.D.   On: 10/05/2018 15:55   Dg Abd 1 View  Result Date: 10/02/2018 CLINICAL DATA:  Enteric tube placement. EXAM: ABDOMEN - 1 VIEW COMPARISON:  CT abdomen from yesterday. FINDINGS: Enteric tube tip in the stomach with the proximal side port in the lower esophagus. Nonobstructive bowel gas pattern. No acute osseous abnormality. IMPRESSION: 1. Enteric tube tip in the stomach with proximal side port in the lower esophagus. Recommend advancing 7-8 cm. Electronically Signed   By: Obie Dredge M.D.   On: 10/02/2018 18:59   Ct Soft Tissue Neck W Contrast  Result Date:  09/28/2018 CLINICAL DATA:  73 year old male status post tracheostomy and laryngeal biopsy postoperative day 1. Findings suspicious for squamous cell carcinoma. EXAM: CT NECK WITH CONTRAST TECHNIQUE: Multidetector CT imaging of the neck was performed using the standard protocol following the bolus administration of intravenous contrast. CONTRAST:  75mL OMNIPAQUE IOHEXOL 300 MG/ML  SOLN COMPARISON:  Neck CT 02/22/2018. FINDINGS: Pharynx and larynx: Tracheostomy has been placed, and there is a small volume of soft tissue gas tracking in the retropharyngeal space, in the bilateral neck, and into the visible mediastinum. The tube appears well positioned. There is debris in the cricoid above the tube. Trace retained secretions or debris in the trachea below the tube. There is an infiltrative and fungating heterogeneously enhancing mass now occupying the larynx, chiefly at and above the glottis, but also possibly infiltrating and involving the cervical esophagus below the glottis (series 3, image 80). There is extension through the right thyroid cartilage and contiguous appearing tumor - probably ex nodal disease - in the right lower neck lateral to the carotid space (series 3, image 64). There is associated narrowing of the right IJ at that level, which remains patent. All told, the poorly marginated hyperenhancing tumor encompasses 35 x 76 x 66 millimeters (AP by transverse by CC). See series 3, image 63, 64, 74, and coronal images 51 through 54. Tumor extends to the AE folds and the mucosa hypopharynx  is generally hyperenhancing. The oropharynx and nasopharynx are spared. Negative superior parapharyngeal spaces, the right lower paralaryngeal spaces are abnormal. The retropharyngeal space contains gas from the tracheostomy. Salivary glands: Negative sublingual space. The submandibular glands and parotid glands remain within normal limits. Thyroid: Postoperative changes from tracheostomy, but the posterior right thyroid  lobe also appears abutted by tumor on series 3, image 84. Lymph nodes: Extensive right level 3 Aks nodal disease suspected, resulting in a semi contiguous appearance of tumor from the level 3 station into the larynx. Malignant right level 2A lymph nodes measuring 16 millimeter short axis on series 3, image 47. No level 1 node involvement. No abnormal level 4 nodes are evident. There are small but conspicuous left level 2 B (series 3, image 40) and level IIIb (image 61) lymph nodes which are larger than in 2019, up to 7 millimeters diameter. Vascular: Mass effect on the right internal jugular vein from tumor in the right lower neck, but the vein remains patent. Tumor surrounds the right lower carotid space but the right carotid remains patent with calcified atherosclerosis. Other major vascular structures in the neck and at the skull base are patent. Limited intracranial: Negative. Visualized orbits: Negative. Mastoids and visualized paranasal sinuses: Clear. Skeleton: Hyperenhancing tumor appears to occupy the prevertebral space in the lower cervical spine from C4-C5 through C7-T1, and probably is associated with cervical esophageal involvement. See series 3, image 80 and sagittal image 52. No bone erosion identified. No lytic or suspicious osseous lesion identified elsewhere. Upper chest: Centrilobular emphysema. Stable small right apical nodules on series 9, image 88, and left apically nodule on image 94, which are likely related to lung scarring. Small volume pneumomediastinum following tracheostomy tube placement. Distal to the tube the trachea appears patent with some retained secretions. No superior mediastinal lymphadenopathy. Calcified aortic atherosclerosis. IMPRESSION: 1. Infiltrative and poorly marginated enhancing tumor in the lower neck eccentric to the right appears to involve the larynx, the adjacent cervical esophagus, and is somewhat contiguous with ex-nodal disease in the right level 3 station,  surrounding the right carotid space and narrowing the right IJ which remains patent. - all told tumor encompasses 35 x 76 x 66 mm (AP by transverse by CC) - prevertebral space involvement suspected C4-C5 through C7-T1 but no underlying bony changes. - malignant right level 2 nodal disease, and small but suspicious contralateral left level 2 and left level 3 nodes. 2. Status post tracheostomy with small volume pneumomediastinum and soft tissue gas in the neck. The airway appears patent below the tracheostomy tube and no adverse features are identified. Electronically Signed   By: Odessa Fleming M.D.   On: 09/28/2018 17:34   Ct Chest W Contrast  Result Date: 09/29/2018 CLINICAL DATA:  Neck mass, advanced neck cancer, status post tracheostomy. EXAM: CT CHEST WITH CONTRAST TECHNIQUE: Multidetector CT imaging of the chest was performed during intravenous contrast administration. CONTRAST:  75mL OMNIPAQUE IOHEXOL 300 MG/ML  SOLN COMPARISON:  09/28/2018 neck CT. FINDINGS: Cardiovascular: Normal heart size. No significant pericardial effusion/thickening. Three-vessel coronary atherosclerosis. Atherosclerotic nonaneurysmal thoracic aorta. Normal caliber pulmonary arteries. No central pulmonary emboli. Mediastinum/Nodes: No discrete thyroid nodules. Normal thoracic esophagus. Infiltrative right neck mass involving larynx and cervical esophagus, as detailed on neck CT from 1 day prior. Subcutaneous emphysema scattered in the lower neck bilaterally. Scattered pneumomediastinum throughout the bilateral paratracheal and paraesophageal regions. No pathologically enlarged axillary, mediastinal or hilar lymph nodes. Lungs/Pleura: No pneumothorax. No pleural effusion. Moderate centrilobular emphysema with diffuse bronchial wall  thickening. Tracheostomy tube tip is in the tracheal lumen just below thoracic inlet. No acute consolidative airspace disease or lung masses. Scattered calcified granulomas throughout the medial right lower lobe.  A few scattered solid pulmonary nodules in the upper lobes bilaterally, predominantly at the lung apices, largest 6 mm at the apical left upper lobe (series 5/image 17) and 5 mm in the apical right upper lobe (series 5/image 18), all stable since 02/22/2018 CT. Upper abdomen: No acute abnormality. Musculoskeletal: No aggressive appearing focal osseous lesions. Mild symmetric bilateral gynecomastia. Moderate thoracic spondylosis. IMPRESSION: 1. No thoracic adenopathy or other findings suspicious for metastatic disease in the chest. 2. Bilateral upper lobe solid pulmonary nodules, largest 6 mm, all stable since 02/22/2018 neck CT, more likely benign. Suggest follow-up chest CT in 12 months to demonstrate continued stability. 3. Infiltrative right neck mass involving the larynx and cervical esophagus, as detailed on neck CT from 1 day prior. 4. Well-positioned tracheostomy tube. 5. Three-vessel coronary atherosclerosis. Aortic Atherosclerosis (ICD10-I70.0) and Emphysema (ICD10-J43.9). Electronically Signed   By: Delbert Phenix M.D.   On: 09/29/2018 13:41   Ir Gastrostomy Tube Mod Sed  Result Date: 10/04/2018 INDICATION: 73 year old male with dysphagia EXAM: PERC PLACEMENT GASTROSTOMY MEDICATIONS: 2 g Ancef ANESTHESIA/SEDATION: Versed 0.5 mg IV; Fentanyl 25 mcg IV Moderate Sedation Time:  0 The patient was continuously monitored during the procedure by the interventional radiology nurse under my direct supervision. CONTRAST:  None FLUOROSCOPY TIME:  Fluoroscopy Time: 0 minutes 30 seconds (4 mGy). COMPLICATIONS: None immediate. PROCEDURE: Informed written consent was obtained from the patient's family after a thorough discussion of the procedural risks, benefits and alternatives. All questions were addressed. Maximal Sterile Barrier Technique was utilized including caps, mask, sterile gowns, sterile gloves, sterile drape, hand hygiene and skin antiseptic. A timeout was performed prior to the initiation of the  procedure. The epigastrium was prepped with Betadine in a sterile fashion, and a sterile drape was applied covering the operative field. A sterile gown and sterile gloves were used for the procedure. A 5-French orogastric tube is placed under fluoroscopic guidance. Scout imaging of the abdomen confirms barium within the transverse colon. The stomach was distended with gas. Multiple x-ray performed with insufflation of the stomach. The partially barium filled colon did not migrate caudally with insufflation of the stomach. X-rays were performed. We withdrew from the procedure given the anatomy. Patient tolerated the procedure well and remained hemodynamically stable throughout. No complications were encountered and no significant blood loss encountered. IMPRESSION: Attempt at percutaneous gastrostomy demonstrates insufficient anatomy for image guided placement. The transverse colon did not migrate caudally with insufflation of the stomach. Signed, Yvone Neu. Loreta Ave, DO [facility] Electronically Signed   By: Gilmer Mor D.O.   On: 10/04/2018 14:25   Ir Removal [employer] W/ [place] W/o Fl Mod Sed  Result Date: 10/14/2018 CLINICAL DATA:  History of head and neck squamous cell carcinoma. Status post right upper chest subcutaneous Port-A-Cath placement on 10/04/2018 with catheter access via the right internal jugular vein. Development Pseudomonas bacteremia now requiring Port-A-Cath removal due to suspected catheter seeding/source for infection. EXAM: REMOVAL OF IMPLANTED TUNNELED PORT-A-CATH MEDICATIONS: None PROCEDURE: The right chest Port-A-Cath site was prepped with chlorhexidine. A sterile gown and gloves were worn during the procedure. Local anesthesia was provided with 1% lidocaine. An incision was made overlying the Port-A-Cath with a #15 scalpel. Utilizing sharp and blunt dissection, the Port-A-Cath was removed. The pocked was debrided with gauze  soaked  in sterile saline. Iodoform gauze was then packed into the wound. A dressing was then applied over the iodoform gauze. FINDINGS: After making an incision, the port pocket was inspected and demonstrates bloody fluid which is mildly turbulent and felt to be likely infected. Tissue was also indurated and therefore the pocket was left open to heal secondarily. After debridement, iodoform gauze was packed into the port wound. The entire Port-A-Cath and attached catheter were successfully removed. IMPRESSION: Removal of implanted Port-A-Cath utilizing sharp and blunt dissection. The port pocket demonstrates what appears to be likely infected hematoma. After debridement of bloody fluid and clot, the wound was packed with iodoform gauze. Electronically Signed   By: Irish Lack M.D.   On: 10/14/2018 14:34   Dg Chest Port 1 View  Result Date: 10/18/2018 CLINICAL DATA:  Shortness of breath. EXAM: PORTABLE CHEST 1 VIEW COMPARISON:  10/11/2018. FINDINGS: Interval removal of PowerPort catheter. Tracheostomy tube noted in stable position. Heart size normal. Right base infiltrate with right-sided pleural effusion. No pneumothorax. IMPRESSION: 1. Interval removal of PowerPort catheter. Tracheostomy tube noted with tip in stable position. 2.  Right base infiltrate with right-sided pleural effusion. Electronically Signed   By: Maisie Fus  Register   On: 10/18/2018 13:35   Dg Chest Port 1 View  Result Date: 10/11/2018 CLINICAL DATA:  Nausea and vomiting yesterday.  New tracheostomy. EXAM: PORTABLE CHEST 1 VIEW COMPARISON:  Chest CT 09/29/2018 FINDINGS: Tracheostomy tube appears in adequate position. Right IJ Port-A-Cath has tip over the SVC. Lungs are adequately inflated without focal airspace consolidation or effusion. No pneumothorax. Cardiomediastinal silhouette is within normal. Dense radiopaque material/calcifications over the posteromedial right lower lobe as seen on CT scan. Remainder of the exam is unchanged.  IMPRESSION: No acute cardiopulmonary disease. Tracheostomy tube and right IJ Port-A-Cath in adequate position. Electronically Signed   By: Elberta Fortis M.D.   On: 10/11/2018 15:40   Dg Abd Portable 1v  Result Date: 10/11/2018 CLINICAL DATA:  New tracheostomy tube. Nausea and vomiting yesterday. EXAM: PORTABLE ABDOMEN - 1 VIEW COMPARISON:  10/05/2018 FINDINGS: Percutaneous gastrostomy tube projects over the stomach in the left upper quadrant. Bowel gas pattern is nonobstructive. No free peritoneal air. Remainder of the exam is unchanged. IMPRESSION: Nonobstructive bowel gas pattern. Gastrostomy tube projects over the stomach in the left upper quadrant. Electronically Signed   By: Elberta Fortis M.D.   On: 10/11/2018 15:41   Dg Abd Portable 1v  Result Date: 10/03/2018 CLINICAL DATA:  Dysphagia. EXAM: PORTABLE ABDOMEN - 1 VIEW COMPARISON:  Radiograph of October 02, 2018. FINDINGS: The bowel gas pattern is normal. Residual contrast is noted throughout the colon. No radio-opaque calculi or other significant radiographic abnormality are seen. IMPRESSION: No evidence of bowel obstruction or ileus. Electronically Signed   By: Lupita Raider M.D.   On: 10/03/2018 07:52   Dg Abd Portable 1v  Result Date: 10/02/2018 CLINICAL DATA:  Nasogastric tube advancement. EXAM: PORTABLE ABDOMEN - 1 VIEW COMPARISON:  10/02/2018 at 1839 hours FINDINGS: The nasogastric tube has been advanced. Tip now extends further into the stomach with the side hole entering the proximal stomach. Normal bowel gas pattern. IMPRESSION: 1. Advanced nasogastric tube, now fully within the stomach. Electronically Signed   By: Amie Portland M.D.   On: 10/02/2018 20:45   Ir Imaging Guided Port Insertion  Result Date: 10/04/2018 INDICATION: 73 year old male with a history squamous cell carcinoma and dysphagia EXAM: IMPLANTED PORT A CATH PLACEMENT WITH ULTRASOUND AND FLUOROSCOPIC GUIDANCE MEDICATIONS: 2 g Ancef;  The antibiotic was administered within an  appropriate time interval prior to skin puncture. ANESTHESIA/SEDATION: Moderate (conscious) sedation was employed during this procedure. A total of Versed 1.0 mg and Fentanyl 25 mcg was administered intravenously. Moderate Sedation Time: 19 minutes. The patient's level of consciousness and vital signs were monitored continuously by radiology nursing throughout the procedure under my direct supervision. FLUOROSCOPY TIME:  0 minutes, 6 seconds (1 mGy) COMPLICATIONS: None PROCEDURE: The procedure, risks, benefits, and alternatives were explained to the patient. Questions regarding the procedure were encouraged and answered. The patient understands and consents to the procedure. Ultrasound survey was performed with images stored and sent to PACs. The right neck and chest was prepped with chlorhexidine, and draped in the usual sterile fashion using maximum barrier technique (cap and mask, sterile gown, sterile gloves, large sterile sheet, hand hygiene and cutaneous antiseptic). Antibiotic prophylaxis was provided with 2.0g Ancef administered IV one hour prior to skin incision. Local anesthesia was attained by infiltration with 1% lidocaine without epinephrine. Ultrasound demonstrated patency of the right internal jugular vein, and this was documented with an image. Under real-time ultrasound guidance, this vein was accessed with a 21 gauge micropuncture needle and image documentation was performed. A small dermatotomy was made at the access site with an 11 scalpel. A 0.018" wire was advanced into the SVC and used to estimate the length of the internal catheter. The access needle exchanged for a 47F micropuncture vascular sheath. The 0.018" wire was then removed and a 0.035" wire advanced into the IVC. An appropriate location for the subcutaneous reservoir was selected below the clavicle and an incision was made through the skin and underlying soft tissues. The subcutaneous tissues were then dissected using a combination  of blunt and sharp surgical technique and a pocket was formed. A single lumen power injectable portacatheter was then tunneled through the subcutaneous tissues from the pocket to the dermatotomy and the port reservoir placed within the subcutaneous pocket. The venous access site was then serially dilated and a peel away vascular sheath placed over the wire. The wire was removed and the port catheter advanced into position under fluoroscopic guidance. The catheter tip is positioned in the cavoatrial junction. This was documented with a spot image. The portacatheter was then tested and found to flush and aspirate well. The port was flushed with saline followed by 100 units/mL heparinized saline. The pocket was then closed in two layers using first subdermal inverted interrupted absorbable sutures followed by a running subcuticular suture. The epidermis was then sealed with Dermabond. The dermatotomy at the venous access site was also seal with Dermabond. Patient tolerated the procedure well and remained hemodynamically stable throughout. No complications encountered and no significant blood loss encountered IMPRESSION: Status post port catheter placement.  Catheter ready for use. Signed, Yvone Neu. Reyne Dumas, RPVI [facility] Radiology Electronically Signed   By: Gilmer Mor D.O.   On: 10/04/2018 14:21   US Thoracentesis Asp Pleural Space W/img Guide  Result Date: 10/19/2018 INDICATION: Shortness of breath. Right-sided pleural effusion. Request for diagnostic and therapeutic thoracentesis. EXAM: ULTRASOUND GUIDED RIGHT THORACENTESIS MEDICATIONS: None. COMPLICATIONS: None immediate. PROCEDURE: An ultrasound guided thoracentesis was thoroughly discussed with the patient and questions answered. The benefits, risks, alternatives and complications were also discussed. The patient understands and wishes to proceed with the procedure. Written consent was obtained.  Ultrasound was performed to localize and mark an adequate pocket of fluid in the right chest. The area was then prepped  and draped in the normal sterile fashion. 1% Lidocaine was used for local anesthesia. Under ultrasound guidance a 6 Fr Safe-T-Centesis catheter was introduced. Thoracentesis was performed. The catheter was removed and a dressing applied. FINDINGS: A total of approximately 450 mL of clear, pale yellow fluid was removed. Samples were sent to the laboratory as requested by the clinical team. IMPRESSION: Successful ultrasound guided right thoracentesis yielding 450 mL of pleural fluid. Read by: Brayton El PA-C Electronically Signed   By: Simonne Come M.D.   On: 10/19/2018 15:29    Lab Data:  CBC: Recent Labs  Lab 10/19/18 0516 10/20/18 0515 10/21/18 0450 10/22/18 0417 10/23/18 0438 10/24/18 0457  WBC 11.5* 12.1* 10.7* 11.5* 10.9* 9.7  NEUTROABS 7.7 8.7* 7.7  --   --   --   HGB 9.2* 8.9* 8.7* 8.9* 9.0* 9.7*  HCT 31.4* 28.7* 29.3* 30.1* 30.4* 31.2*  MCV 96.3 91.4 93.0 93.5 93.0 91.8  PLT 313 329 316 316 328 294   Basic Metabolic Panel: Recent Labs  Lab 10/18/18 0417 10/19/18 0516 10/20/18 0510 10/20/18 0515 10/21/18 0450 10/22/18 0417 10/23/18 0438 10/24/18 0457  NA 138 141  --  140 141 138 138 138  K 3.7 4.6  --  4.2 4.5 4.0 4.2 4.3  CL 107 110  --  106 105 105 104 100  CO2 24 22  --  29 28 29 27 29   GLUCOSE 99 115*  --  109* 106* 104* 115* 104*  BUN 13 13  --  13 16 16 15 15   CREATININE 0.58* 0.59*  --  0.69 0.69 0.63 0.66 0.74  CALCIUM 7.7* 8.0*  --  8.2* 8.2* 8.3* 8.3* 8.8*  MG 1.7 2.1 1.8  --  2.2  --   --   --    GFR: Estimated Creatinine Clearance: 70.4 mL/min (by C-G formula based on SCr of 0.74 mg/dL). Liver Function Tests: Recent Labs  Lab 10/19/18 0516  AST 36  ALT 77*  ALKPHOS 82  BILITOT 0.3  PROT 5.2*  ALBUMIN 2.1*   No results for input(s): LIPASE, AMYLASE in the last 168 hours. No results for input(s): AMMONIA in the last 168  hours. Coagulation Profile: No results for input(s): INR, PROTIME in the last 168 hours. Cardiac Enzymes: No results for input(s): CKTOTAL, CKMB, CKMBINDEX, TROPONINI in the last 168 hours. BNP (last 3 results) No results for input(s): PROBNP in the last 8760 hours. HbA1C: No results for input(s): HGBA1C in the last 72 hours. CBG: Recent Labs  Lab 10/23/18 2356 10/24/18 0408 10/24/18 0753 10/24/18 1155 10/24/18 1519  GLUCAP 110* 84 94 112* 130*   Lipid Profile: No results for input(s): CHOL, HDL, LDLCALC, TRIG, CHOLHDL, LDLDIRECT in the last 72 hours. Thyroid Function Tests: No results for input(s): TSH, T4TOTAL, FREET4, T3FREE, THYROIDAB in the last 72 hours. Anemia Panel: No results for input(s): VITAMINB12, FOLATE, FERRITIN, TIBC, IRON, RETICCTPCT in the last 72 hours. Urine analysis:    Component Value Date/Time   COLORURINE YELLOW 03/30/2015 1330   APPEARANCEUR CLEAR 03/30/2015 1330   LABSPEC >1.030 (H) 03/30/2015 1330   PHURINE 5.5 03/30/2015 1330   GLUCOSEU NEGATIVE 03/30/2015 1330   HGBUR TRACE (A) 03/30/2015 1330   BILIRUBINUR NEGATIVE 03/30/2015 1330   KETONESUR NEGATIVE 03/30/2015 1330   PROTEINUR NEGATIVE 03/30/2015 1330   NITRITE NEGATIVE 03/30/2015 1330   LEUKOCYTESUR NEGATIVE 03/30/2015 1330     Nazifa Trinka M.D. [facility] 10/24/2018, 4:29 PM  Pager: [phone] Between 7am to 7pm -  call Pager - [phone]  After 7pm go to [email or web address] - password [email or web address]  Call night coverage person covering after 7pm

## 2018-10-24 NOTE — Progress Notes (Addendum)
Nutrition Follow-up  RD working remotely.   DOCUMENTATION CODES:   Severe malnutrition in context of chronic illness  INTERVENTION:  - will change from bolus TF regimen to continuous TF regimen to aid in feeling of fullness. - will order 30 ml prostat BID with [employer] Peptide 1.5 @ 30 ml/hr to advance by 10 ml every 24 hours to reach goal rate of 60 ml/hr. - at goal rate, this regimen will provide 2418 kcal, 131 grams protein, and 13 grams fiber.  - will increase free water to 200 ml every 4 hours (1200 ml/day). - spoke with [employer] rep yesterday and more TF is to arrive to [facility] today, per her report.   * recommend check the following in next lab draw: Mg, Phos, vitamin D, vitamin B12, zinc.    NUTRITION DIAGNOSIS:   Severe Malnutrition related to chronic illness, cancer and cancer related treatments as evidenced by percent weight loss, severe muscle depletion, moderate muscle depletion, mild fat depletion. -ongoing  GOAL:   Patient will meet greater than or equal to 90% of their needs -to be met with TF regimen  MONITOR:   Labs, I & O's, Skin, TF tolerance, Weight trends  ASSESSMENT:   Patient with PMH significant for diverticulitis, gastritis, HLD, HTN, laryngeal cancer, and esophogeal dysphagia. 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  Significant Events: 7/3- admission; tracheostomy with laryngoscopy and biopsy 7/8- initial RD assessment 7/11- NGT placed and TF initiated (Osmolite 1.5) 7/13- NFPE completed and patient identified to meet criteria for severe malnutrition; G-tube placed 7/16- transitioned from continuous TF to bolus TF (Osomolite 1.5); imodium started 7/17- c.diff checked--negative 7/20- port removal by IR d/t pseudomonas bacteremia 7/22- transferred from [facility] to [place] for initiation of radiation; radiation simulation 7/23- changed TF regimen to half Jevity 1.5  and half Osmolite 1.5; increased water flush to 150 ml x6/day 7/25- thoracentesis with 450 ml removed 7/27- changed TF regimen to 1 can [employer] Peptide 1.5 x5/day with 150 ml free water x6/day 7/28- first radiation treatment 7/29- decreased from 5 TF boluses/day to 3 TF boluses/day d/t feelings of fullness 7/30- type 5 BM   Discussed patient with RD previously following him at [facility], [facility] RD, and Dr. Tana Coast. Last note by this RD yesterday at 11:39 AM. Later in the day yesterday, patient reported feelings of fullness and TF boluses were decreased from 5/day to 3/day to aid in decreasing full feeling. 1 can [employer] Peptide 1.5 TID provides 1500 kcal (65% estimated kcal need), 72 grams protein (63% estimated protein need), and 9 grams of fiber.   Will transition to continuous TF regimen and slowly advance to goal rate to assess if this aids in full feeling. TF regimen as outlined above. Today is the first time since TF start that patient has had something other than type 6 or type 7 BM (type 5 BM this AM). Will continue to monitor stool consistency and frequency to determine if other interventions are warranted/should be recommended.   Weight was stable 7/24-7/28 and trending down since that time. Current weight is -4.2 kg/9 lb compared to 7/28.  *at this time, it is unknown when patient may discharge or what discharge plan will be (home vs facility).     Labs reviewed; CBG: 84 mg/dl today, Ca: 8.8 mg/dl, K WDL. Medications reviewed; 2 g imodium per PEG PRN, 40 mg protonix per PEG/day, 947 mg mylicon QID. IVF; LR @  10 ml/hr.   Diet Order:   Diet Order            Diet NPO time specified Except for: Sips with Meds  Diet effective midnight        Diet general              EDUCATION NEEDS:   Not appropriate for education at this time  Skin:  Skin Assessment: Skin Integrity Issues: Skin Integrity Issues:: Incisions, Other (Comment) Incisions: neck/abdomen, R chest  (7/20) Other: new wound-sacrum  Last BM:  7/30 (1 type 5 so far today)  Height:   Ht Readings from Last 1 Encounters:  09/27/18 5' 9"  (1.753 m)    Weight:   Wt Readings from Last 1 Encounters:  10/24/18 58.8 kg    Ideal Body Weight:  72.7 kg  BMI:  Body mass index is 19.14 kg/m.  Estimated Nutritional Needs:   Kcal:  2300-2500 kcal  Protein:  115-130 grams  Fluid:  >/= 2.3 L/day     Jarome Matin, MS, RD, LDN, [facility] Inpatient Clinical Dietitian Pager # [phone] After hours/weekend pager # [phone]

## 2018-10-24 NOTE — Progress Notes (Signed)
Speech Language Pathology Treatment: Logan Price Speaking valve  Patient Details Name: Logan Price MRN: [id number] DOB: 27-Aug-1945 Today's Date: 10/24/2018 Time: 3474-2595 SLP Time Calculation (min) (ACUTE ONLY): 20 min  Assessment / Plan / Recommendation Clinical Impression  Patient seen to address PMV usage goals and speech/voice goals. He tolerated PMV in place for 15 minutes but with brief periods during which he was coughing and it became dislodged. He stated that when the PMV is on, he feels, "things start to build up" and then he is able to cough and clear secretions. He was able to maintain adequate breath support for voicing at sentence level of 8-10 words but did start to feel that he was losing breath support at end. No distress noted with PMV placement and SLP left it on with patient saying he would like to have it on for "a half hour". SLP returned at designated time and he had already taken it off.     HPI HPI: Pt is a 73 yo male admitted for planned trach and laryngeal biopsy 7/2. 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 Esophagram 6/26 showed silent gross aspiration of barium. PMH includes: HLD, HTN, hepatitis C, GERD, diverticulitis      SLP Plan  Continue with current plan of care       Recommendations         Patient may use Passy-Muir Speech Valve: During all therapies with supervision;Intermittently with supervision PMSV Supervision: Full         Oral Care Recommendations: Oral care BID Follow up Recommendations: Outpatient SLP;Home health SLP SLP Visit Diagnosis: Aphonia (R49.1) Plan: Continue with current plan of care       GO                [name] 10/24/2018, 5:39 PM   Angela Nevin, MA, CCC-SLP Speech Therapy [facility]

## 2018-10-25 ENCOUNTER — Ambulatory Visit
Admit: 2018-10-25 | Discharge: 2018-10-25 | Disposition: A | Discharge status: Home / Self Care | Payer: Medicare Other | Attending: Radiation Oncology | Admitting: Radiation Oncology

## 2018-10-25 LAB — GLUCOSE, CAPILLARY
Glucose-Capillary: 107 mg/dL — ABNORMAL HIGH (ref 70–99)
Glucose-Capillary: 96 mg/dL (ref 70–99)
Glucose-Capillary: 107 mg/dL — ABNORMAL HIGH (ref 70–99)
Glucose-Capillary: 106 mg/dL — ABNORMAL HIGH (ref 70–99)
Glucose-Capillary: 102 mg/dL — ABNORMAL HIGH (ref 70–99)
Glucose-Capillary: 101 mg/dL — ABNORMAL HIGH (ref 70–99)

## 2018-10-25 LAB — COMPREHENSIVE METABOLIC PANEL
ALT: 33 U/L (ref 0–44)
AST: 21 U/L (ref 15–41)
Albumin: 2.5 g/dL — ABNORMAL LOW (ref 3.5–5.0)
Alkaline Phosphatase: 72 U/L (ref 38–126)
Anion gap: 7 (ref 5–15)
BUN: 13 mg/dL (ref 8–23)
CO2: 30 mmol/L (ref 22–32)
Calcium: 9.1 mg/dL (ref 8.9–10.3)
Chloride: 101 mmol/L (ref 98–111)
Creatinine, Ser: 0.66 mg/dL (ref 0.61–1.24)
GFR calc Af Amer: >60 mL/min (ref >60)
GFR calc non Af Amer: >60 mL/min (ref >60)
Glucose, Bld: 108 mg/dL — ABNORMAL HIGH (ref 70–99)
Potassium: 4.3 mmol/L (ref 3.5–5.1)
Sodium: 138 mmol/L (ref 135–145)
Total Bilirubin: 0.5 mg/dL (ref 0.3–1.2)
Total Protein: 6.3 g/dL — ABNORMAL LOW (ref 6.5–8.1)

## 2018-10-25 LAB — MAGNESIUM: Magnesium: 2 mg/dL (ref 1.7–2.4)

## 2018-10-25 LAB — PHOSPHORUS: Phosphorus: 4.4 mg/dL (ref 2.5–4.6)

## 2018-10-25 LAB — VITAMIN B12: Vitamin B-12: 939 pg/mL — ABNORMAL HIGH (ref 180–914)

## 2018-10-25 NOTE — Progress Notes (Signed)
[facility]                                                                              Patient Demographics  Logan Price, is a 73 y.o. male, DOB - 10-11-1945, KYH:[id number]  Admit date - 09/27/2018   Admitting Physician [name], MD  Outpatient Primary MD for the patient is [name], FNP  Outpatient specialists:   LOS - 28  days   Medical records reviewed and are as summarized below:    No chief complaint on file.      Brief summary  73 year old with history of GERD, essential hypertension, hyperlipidemia who recently diagnosed neck mass concerning for malignancy was admitted to the hospital. Overnight patient went into SVT therefore medical team was consulted. Patient was given adenosine x1 which converted back to normal sinus rhythm. G-tube placed by surgery 7/13, tolerating tube feeds. Metoprolol was started via PEG tube. Hospitals course was later on complicated by Pseudomonas bacteremia secondary to infected Port-A-Cath. Patient was transferred to medicine service. Infectious disease team were consulted who recommended removing Port-A-Cath.    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  Trach care per ENT. -Per patient, on 7 weeks radiation cycles, has XRT today  Active problems Pseudomonas bacteremia -Likely source Port-A-Cath, removed on 10/14/2018. -ID recommended IV cefepime for  total 2 weeks post Port-A-Cath removal, end date on 10/29/2018 -Repeat blood cultures negative till date -PICC line can be placed in case patient is transition to SNF, social work consult placed  Acute kidney injury secondary to acute urinary retention -Patient had over 1 L of urine in his bladder, creatinine 5.3 on 7/20, after placing Foley catheter, creatinine has been steadily improving -Creatinine stable 0.6, continue Foley catheter, had failed voiding trial and Foley catheter had to be placed back in -Patient will need urology outpatient follow-up for voiding trial and further evaluation.  Paroxysmal SVT, intermittent and recurrent -Patient had 25 beats run of nonsustained V. tach on 10/20/2018 a.m. -Patient was seen by cardiology, currently signed off.  2D echo normal, TSH within normal limits -Keep potassium ~4, magnesium above 2  Transudative pleural effusions, pneumonia, shortness of breath -Patient had complained of dyspnea on 7/24, intermittent, noticed to be 8.9 L positive during hospitalization.  Chest x-ray showed a right-sided pleural effusion with concerns for right basilar infiltrate -Ultrasound-guided thoracentesis done on 7/25 with 450 cc of clear fluid removed, transudative -Urine Legionella antigen negative, urine strep antigen negative -IV vancomycin, Zithromax has been discontinued.  Continue IV cefepime  Diarrhea -Likely secondary to tube feeds, C. difficile PCR negative -RD adjusting tube feeds regimen  Code Status: Full CODE STATUS DVT Prophylaxis: Heparin subcu  Family Communication: Discussed in detail with the patient, all imaging results, lab results explained to the patient and patient's wife on phone.  She is also agreeable to skilled nursing facility, feels that she will not be able to take care of the patient home   Disposition Plan: Discussed in detail with the patient, will finish antibiotics on 8/4, undergoing XRT.  Agreeable for skilled nursing facility,  social work consult placed.  Time Spent in minutes 25 minutes  Procedures:   Percutaneous placement of gastrostomy tube 10/04/2018 per Dr. Ardelle Anton interventional radiology  Port-A-Cath removal per interventional radiology 10/14/2018  Ultrasound-guided thoracentesis 10/19/2018 per Brayton El, PA--- 450 cc of clear pale yellow fluid removed.  Consultants:    Oncology: Dr. Dion Body 09/30/2018  Interventional radiology: Dr. Ardelle Anton 10/02/2018  [facility]: Dr. Julian Reil 10/03/2018  General surgery: Dr. Luisa Hart 10/06/2018  Dental surgery: Dr. Valentino Hue 10/08/2018  Cardiology: Dr. Wyline Mood 10/13/2018  Infectious disease: Dr. Daiva Eves 10/13/2018  Radiation oncology    Antimicrobials:   Anti-infectives (From admission, onward)   Start     Dose/Rate Route Frequency Ordered Stop   10/19/18 0400  vancomycin (VANCOCIN) IVPB 750 mg/150 ml premix  Status:  Discontinued     750 mg 150 mL/hr over 60 Minutes Intravenous Every 12 hours 10/18/18 1541 10/20/18 1006   10/18/18 2000  ceFEPIme (MAXIPIME) 2 g in sodium chloride 0.9 % 100 mL IVPB     2 g 200 mL/hr over 30 Minutes Intravenous Every 8 hours 10/18/18 1243 10/30/18 0559   10/18/18 1600  azithromycin (ZITHROMAX) 500 mg in sodium chloride 0.9 % 250 mL IVPB     500 mg 250 mL/hr over 60 Minutes Intravenous Every 24 hours 10/18/18 1438 10/22/18 2252   10/18/18 1600  vancomycin (VANCOCIN) 1,250 mg in sodium chloride 0.9 % 250 mL IVPB     1,250 mg 166.7 mL/hr over 90 Minutes Intravenous  Once 10/18/18 1516 10/19/18 0100   10/15/18 1145  ceFEPIme (MAXIPIME) 2 g in sodium chloride 0.9 % 100 mL IVPB  Status:  Discontinued     2 g 200 mL/hr over 30 Minutes Intravenous Every 12 hours 10/15/18 1135 10/18/18 1243   10/14/18 1100  ceFEPIme (MAXIPIME) 2 g in sodium chloride 0.9 % 100 mL IVPB  Status:  Discontinued     2 g 200 mL/hr over 30 Minutes Intravenous Every 24 hours 10/13/18 1248 10/15/18 1135   10/14/18 0000  ceFAZolin (ANCEF) IVPB 2g/100 mL  premix     2 g 200 mL/hr over 30 Minutes Intravenous To Radiology 10/13/18 1216 10/14/18 0907   10/12/18 0445  ceFEPIme (MAXIPIME) 2 g in sodium chloride 0.9 % 100 mL IVPB  Status:  Discontinued     2 g 200 mL/hr over 30 Minutes Intravenous Every 12 hours 10/12/18 0439 10/13/18 1248   10/11/18 1100  levofloxacin (LEVAQUIN) IVPB 750 mg  Status:  Discontinued     750 mg 100 mL/hr over 90 Minutes Intravenous Every 24 hours 10/11/18 0956 10/12/18 0439   10/04/18 1330  ceFAZolin (ANCEF) powder 1 g  Status:  Discontinued     1 g Other To Surgery 10/04/18 1317 10/04/18 1454   10/04/18 1315  ceFAZolin (ANCEF) 2-4 GM/100ML-% IVPB    Note to Pharmacy: Unk Lightning  : cabinet override      10/04/18 1315 10/04/18 1317   10/03/18 0800  ceFAZolin (ANCEF) IVPB 2g/100 mL premix     2 g 200 mL/hr over 30 Minutes Intravenous To Radiology 10/02/18 0901 10/04/18 1346  09/27/18 1500  clindamycin (CLEOCIN) IVPB 600 mg     600 mg 100 mL/hr over 30 Minutes Intravenous Every 8 hours 09/27/18 1400 09/28/18 0644         Medications  Scheduled Meds:  amLODipine  5 mg Per Tube Daily   chlorhexidine  15 mL Mouth Rinse BID   doxazosin  1 mg Per Tube Daily   feeding supplement (PRO-STAT SUGAR FREE 64)  30 mL Per Tube BID   free water  200 mL Per Tube Q4H   heparin injection (subcutaneous)  5,000 Units Subcutaneous Q8H   hydrocortisone   Rectal QID   lidocaine  1 application Urethral Once   mouth rinse  15 mL Mouth Rinse q12n4p   metoprolol tartrate  50 mg Per Tube QID   pantoprazole sodium  40 mg Per Tube Daily   scopolamine  1 patch Transdermal Q72H   simethicone  160 mg Per Tube QID   simvastatin  20 mg Per Tube q1800   Continuous Infusions:  sodium chloride Stopped (10/19/18 1736)   ceFEPime (MAXIPIME) IV 2 g (10/25/18 1306)   Molli Posey Peptide 1.5 30 mL (10/24/18 1654)   lactated ringers 10 mL/hr at 10/07/18 0859   sodium chloride 500 mL/hr at 10/14/18 2049   PRN  Meds:.sodium chloride, alum & mag hydroxide-simeth, hydrALAZINE, hydrocortisone, hydrocortisone cream, ipratropium-albuterol, lip balm, loperamide HCl, loratadine, metoprolol tartrate, morphine injection, Muscle Rub, ondansetron (ZOFRAN) IV, phenol, polyethylene glycol, polyvinyl alcohol, promethazine, senna-docusate, sodium chloride      Subjective:   Logan Price was seen and examined today. No acute complaints per patient. XRT today. No SOB.  No chest pain. No fevers    Objective:   Vitals:   10/25/18 0622 10/25/18 0850 10/25/18 1117 10/25/18 1230  BP: 111/63   115/68  Pulse: 80 86 76 79  Resp: 16 18 18 17   Temp: 99.1 F (37.3 C)   98.9 F (37.2 C)  TempSrc: Oral   Oral  SpO2: 99% 98% 96% 97%  Weight: 58.1 kg     Height:        Intake/Output Summary (Last 24 hours) at 10/25/2018 1406 Last data filed at 10/25/2018 1230 Gross per 24 hour  Intake 5410 ml  Output 2500 ml  Net 2910 ml     Wt Readings from Last 3 Encounters:  10/25/18 58.1 kg  09/11/18 63.2 kg  04/25/18 70.4 kg     Exam  General: Alert and oriented x 3, NAD  Eyes:   HEENT: Trach+  Cardiovascular: S1 S2 auscultated, Regular rate and rhythm.  Respiratory: No wheezing or rhonchi, secretions noted  Gastrointestinal: Soft, nontender, nondistended, + bowel sounds  Ext: no pedal edema bilaterally  Neuro: No new deficits  Musculoskeletal: No digital cyanosis, clubbing  Skin: No rashes  Psych: Normal affect and demeanor, alert and oriented x3    Data Reviewed:  I have personally reviewed following labs and imaging studies  Micro Results Recent Results (from the past 240 hour(s))  MRSA PCR Screening     Status: None   Collection Time: 10/15/18  6:17 PM   Specimen: Nasopharyngeal  Result Value Ref Range Status   MRSA by PCR NEGATIVE NEGATIVE Final    Comment:        The GeneXpert MRSA Assay (FDA approved for NASAL specimens only), is one component of a comprehensive MRSA  colonization surveillance program. It is not intended to diagnose MRSA infection nor to guide or monitor treatment for MRSA infections. Performed at [facility], [address], [place], [place] [address]   SARS Coronavirus 2 (CEPHEID - Performed in [facility] hospital lab), Hosp Order     Status: None   Collection Time: 10/18/18  5:01 PM   Specimen: Nasopharyngeal Swab  Result Value Ref Range Status   SARS Coronavirus 2 NEGATIVE NEGATIVE Final    Comment: (NOTE) If result is NEGATIVE SARS-CoV-2 target nucleic acids are NOT DETECTED. The SARS-CoV-2 RNA is generally detectable in upper and lower  respiratory specimens during the acute phase of infection. The lowest  concentration of SARS-CoV-2 viral copies this assay can detect is 250  copies / mL. A negative result does not preclude SARS-CoV-2 infection  and should not be used as the sole basis for treatment or other  patient management decisions.  A negative result may occur with  improper specimen collection / handling, submission of specimen other  than nasopharyngeal swab, presence of viral mutation(s) within the  areas targeted by this assay, and inadequate number of viral copies  (<250 copies / mL). A negative result must be combined with clinical  observations, patient history, and epidemiological information. If result is POSITIVE SARS-CoV-2 target nucleic acids are DETECTED. The SARS-CoV-2 RNA is generally detectable in upper and lower  respiratory specimens dur ing the acute phase of infection.  Positive  results are indicative of active infection with SARS-CoV-2.  Clinical  correlation with patient history and other diagnostic information is  necessary to determine patient infection status.  Positive results do  not rule out bacterial infection or co-infection with other viruses. If result is PRESUMPTIVE POSTIVE SARS-CoV-2 nucleic acids MAY BE PRESENT.   A presumptive positive result was obtained  on the submitted specimen  and confirmed on repeat testing.  While 2019 novel coronavirus  (SARS-CoV-2) nucleic acids may be present in the submitted sample  additional confirmatory testing may be necessary for epidemiological  and / or clinical management purposes  to differentiate between  SARS-CoV-2 and other Sarbecovirus currently known to infect humans.  If clinically indicated additional testing with an alternate test  methodology [phone]) is advised. The SARS-CoV-2 RNA is generally  detectable in upper and lower respiratory sp ecimens during the acute  phase of infection. The expected result is Negative. Fact Sheet for Patients:  BoilerBrush.com.cy Fact Sheet for Healthcare Providers: https://pope.com/ This test is not yet approved or cleared by the [place] FDA and has been authorized for detection and/or diagnosis of SARS-CoV-2 by FDA under an Emergency Use Authorization (EUA).  This EUA will remain in effect (meaning this test can be used) for the duration of the COVID-19 declaration under Section 564(b)(1) of the Act, 21 U.S.C. section 360bbb-3(b)(1), unless the authorization is terminated or revoked sooner. Performed at [facility], [address], [place], [place] [address]   C difficile quick scan w PCR reflex     Status: None   Collection Time: 10/18/18  8:32 PM   Specimen: STOOL  Result Value Ref Range Status   C Diff antigen NEGATIVE NEGATIVE Final   C Diff toxin NEGATIVE NEGATIVE Final   C Diff interpretation No C. difficile detected.  Final    Comment: Performed at [facility], [address] [place], [place] [address]  Culture, body fluid-bottle     Status: None   Collection Time: 10/19/18  3:20 PM   Specimen: Pleura  Result Value Ref Range Status   Specimen Description PLEURAL  Final   Special Requests NONE  Final   Culture   Final    NO GROWTH 5 DAYS Performed  at [facility], [address], [place], [place] [address]    Report Status 10/24/2018 FINAL  Final  Gram stain     Status: None   Collection Time: 10/19/18  3:20 PM   Specimen: Pleura  Result Value Ref Range Status   Specimen Description PLEURAL  Final   Special Requests NONE  Final   Gram Stain   Final    RARE WBC PRESENT, PREDOMINANTLY PMN NO ORGANISMS SEEN Performed at [facility], [address], [place], [place] [address]    Report Status 10/20/2018 FINAL  Final    Radiology Reports Ct Abdomen Wo Contrast  Result Date: 10/01/2018 CLINICAL DATA:  73 year old male undergoing evaluation for gastrostomy tube placement. EXAM: CT ABDOMEN WITHOUT CONTRAST TECHNIQUE: Multidetector CT imaging of the abdomen was performed following the standard protocol without IV contrast. COMPARISON:  Prior abdominal ultrasound 12/25/2016 FINDINGS: Lower chest: Mild lower lobe bronchial wall thickening. Trace atelectasis versus infiltrate in the dependent portion of the right lower lobe. No suspicious pulmonary nodule or mass. The visualized heart is normal in size. Unremarkable distal thoracic esophagus. Hepatobiliary: Normal hepatic contour and morphology. No discrete hepatic lesion. The gallbladder lumen is filled with high density material likely representing sludge and/or small stones. No biliary ductal dilatation. Pancreas: Unremarkable. No pancreatic ductal dilatation or surrounding inflammatory changes. Spleen: Normal in size without focal abnormality. Adrenals/Urinary Tract: Normal adrenal glands. Circumscribed low-attenuation lesions within the right kidney are incompletely characterized in the absence of intravenous contrast. However, a sonographically simple cyst was previously identified in the lower pole. No evidence of hydronephrosis or nephrolithiasis. Stomach/Bowel: High-riding transverse colon which lies anterior to the gastric body and antrum. Otherwise, normal gastric anatomy. No  focal bowel wall thickening or evidence of obstruction. Vascular/Lymphatic: Limited evaluation in the absence of intravenous contrast. Extensive atherosclerotic vascular calcifications. No aneurysm. Other: No ascites or abdominal wall hernia. Musculoskeletal: No acute fracture or aggressive appearing lytic or blastic osseous lesion. IMPRESSION: 1. High-riding transverse colon which lies anterior to the gastric body and antrum. Percutaneous gastrostomy tube placement may be possible, however will require good visualization of the transverse colon at the time of attempted procedure. Consider administration of barium prior to the procedure. 2.  Aortic Atherosclerosis (ICD10-170.0). 3. Right lower lobe atelectasis versus changes of small volume aspiration. 4. Sludge and/or small stones in the gallbladder lumen. Electronically Signed   By: Malachy Moan M.D.   On: 10/01/2018 14:18   Dg Orthopantogram  Result Date: 10/08/2018 CLINICAL DATA:  Poor dentition. Preoperative for laryngeal carcinoma. EXAM: ORTHOPANTOGRAM/PANORAMIC COMPARISON:  None. FINDINGS: Missing LEFT maxillary teeth from a first bicuspid through distal molar. Missing RIGHT maxillary teeth from second bicuspid through distal molar. Missing RIGHT mandibular teeth from first through distal molar. Missing LEFT mandibular first molar. No periapical lucency to suggest abscess. No definite caries. IMPRESSION: 1. Numerous missing mandibular and maxillary teeth, as detailed above. 2. No evidence of periapical dental abscess. Electronically Signed   By: Bary Richard M.D.   On: 10/08/2018 13:27   Dg Chest 1 View  Result Date: 10/19/2018 CLINICAL DATA:  S/P right thoracentesis. EXAM: CHEST  1 VIEW COMPARISON:  10/18/2018 FINDINGS: Tracheostomy tube is unchanged. The heart size is normal. Small RIGHT pleural effusion, decreased since prior study. No pneumothorax. Minimal bibasilar atelectasis. IMPRESSION: Decreased RIGHT pleural effusion. No pneumothorax.  Electronically Signed   By: Norva Pavlov M.D.  On: 10/19/2018 15:43   Dg Abd 1 View  Result Date: 10/05/2018 CLINICAL DATA:  Is a gastric tube placement. EXAM: ABDOMEN - 1 VIEW COMPARISON:  October 04, 2018 FINDINGS: Nonobstructive bowel gas pattern. Residual contrast throughout the colon and in the appendix. Nonobstructive bowel gas pattern. Enteric catheter overlies the expected location of the gastric cardia with side hole at the expected location of the GE junction. IMPRESSION: Enteric catheter overlies the expected location of the gastric cardia with side hole at the expected location of the GE junction. Electronically Signed   By: Ted Mcalpine M.D.   On: 10/05/2018 15:55   Dg Abd 1 View  Result Date: 10/02/2018 CLINICAL DATA:  Enteric tube placement. EXAM: ABDOMEN - 1 VIEW COMPARISON:  CT abdomen from yesterday. FINDINGS: Enteric tube tip in the stomach with the proximal side port in the lower esophagus. Nonobstructive bowel gas pattern. No acute osseous abnormality. IMPRESSION: 1. Enteric tube tip in the stomach with proximal side port in the lower esophagus. Recommend advancing 7-8 cm. Electronically Signed   By: Obie Dredge M.D.   On: 10/02/2018 18:59   Ct Soft Tissue Neck W Contrast  Result Date: 09/28/2018 CLINICAL DATA:  73 year old male status post tracheostomy and laryngeal biopsy postoperative day 1. Findings suspicious for squamous cell carcinoma. EXAM: CT NECK WITH CONTRAST TECHNIQUE: Multidetector CT imaging of the neck was performed using the standard protocol following the bolus administration of intravenous contrast. CONTRAST:  75mL OMNIPAQUE IOHEXOL 300 MG/ML  SOLN COMPARISON:  Neck CT 02/22/2018. FINDINGS: Pharynx and larynx: Tracheostomy has been placed, and there is a small volume of soft tissue gas tracking in the retropharyngeal space, in the bilateral neck, and into the visible mediastinum. The tube appears well positioned. There is debris in the cricoid above the  tube. Trace retained secretions or debris in the trachea below the tube. There is an infiltrative and fungating heterogeneously enhancing mass now occupying the larynx, chiefly at and above the glottis, but also possibly infiltrating and involving the cervical esophagus below the glottis (series 3, image 80). There is extension through the right thyroid cartilage and contiguous appearing tumor - probably ex nodal disease - in the right lower neck lateral to the carotid space (series 3, image 64). There is associated narrowing of the right IJ at that level, which remains patent. All told, the poorly marginated hyperenhancing tumor encompasses 35 x 76 x 66 millimeters (AP by transverse by CC). See series 3, image 63, 64, 74, and coronal images 51 through 54. Tumor extends to the AE folds and the mucosa hypopharynx is generally hyperenhancing. The oropharynx and nasopharynx are spared. Negative superior parapharyngeal spaces, the right lower paralaryngeal spaces are abnormal. The retropharyngeal space contains gas from the tracheostomy. Salivary glands: Negative sublingual space. The submandibular glands and parotid glands remain within normal limits. Thyroid: Postoperative changes from tracheostomy, but the posterior right thyroid lobe also appears abutted by tumor on series 3, image 84. Lymph nodes: Extensive right level 3 Aks nodal disease suspected, resulting in a semi contiguous appearance of tumor from the level 3 station into the larynx. Malignant right level 2A lymph nodes measuring 16 millimeter short axis on series 3, image 47. No level 1 node involvement. No abnormal level 4 nodes are evident. There are small but conspicuous left level 2 B (series 3, image 40) and level IIIb (image 61) lymph nodes which are larger than in 2019, up to 7 millimeters diameter. Vascular: Mass effect on the right internal jugular  vein from tumor in the right lower neck, but the vein remains patent. Tumor surrounds the right lower  carotid space but the right carotid remains patent with calcified atherosclerosis. Other major vascular structures in the neck and at the skull base are patent. Limited intracranial: Negative. Visualized orbits: Negative. Mastoids and visualized paranasal sinuses: Clear. Skeleton: Hyperenhancing tumor appears to occupy the prevertebral space in the lower cervical spine from C4-C5 through C7-T1, and probably is associated with cervical esophageal involvement. See series 3, image 80 and sagittal image 52. No bone erosion identified. No lytic or suspicious osseous lesion identified elsewhere. Upper chest: Centrilobular emphysema. Stable small right apical nodules on series 9, image 88, and left apically nodule on image 94, which are likely related to lung scarring. Small volume pneumomediastinum following tracheostomy tube placement. Distal to the tube the trachea appears patent with some retained secretions. No superior mediastinal lymphadenopathy. Calcified aortic atherosclerosis. IMPRESSION: 1. Infiltrative and poorly marginated enhancing tumor in the lower neck eccentric to the right appears to involve the larynx, the adjacent cervical esophagus, and is somewhat contiguous with ex-nodal disease in the right level 3 station, surrounding the right carotid space and narrowing the right IJ which remains patent. - all told tumor encompasses 35 x 76 x 66 mm (AP by transverse by CC) - prevertebral space involvement suspected C4-C5 through C7-T1 but no underlying bony changes. - malignant right level 2 nodal disease, and small but suspicious contralateral left level 2 and left level 3 nodes. 2. Status post tracheostomy with small volume pneumomediastinum and soft tissue gas in the neck. The airway appears patent below the tracheostomy tube and no adverse features are identified. Electronically Signed   By: Odessa Fleming M.D.   On: 09/28/2018 17:34   Ct Chest W Contrast  Result Date: 09/29/2018 CLINICAL DATA:  Neck mass,  advanced neck cancer, status post tracheostomy. EXAM: CT CHEST WITH CONTRAST TECHNIQUE: Multidetector CT imaging of the chest was performed during intravenous contrast administration. CONTRAST:  75mL OMNIPAQUE IOHEXOL 300 MG/ML  SOLN COMPARISON:  09/28/2018 neck CT. FINDINGS: Cardiovascular: Normal heart size. No significant pericardial effusion/thickening. Three-vessel coronary atherosclerosis. Atherosclerotic nonaneurysmal thoracic aorta. Normal caliber pulmonary arteries. No central pulmonary emboli. Mediastinum/Nodes: No discrete thyroid nodules. Normal thoracic esophagus. Infiltrative right neck mass involving larynx and cervical esophagus, as detailed on neck CT from 1 day prior. Subcutaneous emphysema scattered in the lower neck bilaterally. Scattered pneumomediastinum throughout the bilateral paratracheal and paraesophageal regions. No pathologically enlarged axillary, mediastinal or hilar lymph nodes. Lungs/Pleura: No pneumothorax. No pleural effusion. Moderate centrilobular emphysema with diffuse bronchial wall thickening. Tracheostomy tube tip is in the tracheal lumen just below thoracic inlet. No acute consolidative airspace disease or lung masses. Scattered calcified granulomas throughout the medial right lower lobe. A few scattered solid pulmonary nodules in the upper lobes bilaterally, predominantly at the lung apices, largest 6 mm at the apical left upper lobe (series 5/image 17) and 5 mm in the apical right upper lobe (series 5/image 18), all stable since 02/22/2018 CT. Upper abdomen: No acute abnormality. Musculoskeletal: No aggressive appearing focal osseous lesions. Mild symmetric bilateral gynecomastia. Moderate thoracic spondylosis. IMPRESSION: 1. No thoracic adenopathy or other findings suspicious for metastatic disease in the chest. 2. Bilateral upper lobe solid pulmonary nodules, largest 6 mm, all stable since 02/22/2018 neck CT, more likely benign. Suggest follow-up chest CT in 12 months to  demonstrate continued stability. 3. Infiltrative right neck mass involving the larynx and cervical esophagus, as detailed on neck CT from  1 day prior. 4. Well-positioned tracheostomy tube. 5. Three-vessel coronary atherosclerosis. Aortic Atherosclerosis (ICD10-I70.0) and Emphysema (ICD10-J43.9). Electronically Signed   By: Delbert Phenix M.D.   On: 09/29/2018 13:41   Ir Gastrostomy Tube Mod Sed  Result Date: 10/04/2018 INDICATION: 73 year old male with dysphagia EXAM: PERC PLACEMENT GASTROSTOMY MEDICATIONS: 2 g Ancef ANESTHESIA/SEDATION: Versed 0.5 mg IV; Fentanyl 25 mcg IV Moderate Sedation Time:  0 The patient was continuously monitored during the procedure by the interventional radiology nurse under my direct supervision. CONTRAST:  None FLUOROSCOPY TIME:  Fluoroscopy Time: 0 minutes 30 seconds (4 mGy). COMPLICATIONS: None immediate. PROCEDURE: Informed written consent was obtained from the patient's family after a thorough discussion of the procedural risks, benefits and alternatives. All questions were addressed. Maximal Sterile Barrier Technique was utilized including caps, mask, sterile gowns, sterile gloves, sterile drape, hand hygiene and skin antiseptic. A timeout was performed prior to the initiation of the procedure. The epigastrium was prepped with Betadine in a sterile fashion, and a sterile drape was applied covering the operative field. A sterile gown and sterile gloves were used for the procedure. A 5-French orogastric tube is placed under fluoroscopic guidance. Scout imaging of the abdomen confirms barium within the transverse colon. The stomach was distended with gas. Multiple x-ray performed with insufflation of the stomach. The partially barium filled colon did not migrate caudally with insufflation of the stomach. X-rays were performed. We withdrew from the procedure given the anatomy. Patient tolerated the procedure well and remained hemodynamically stable throughout. No complications were  encountered and no significant blood loss encountered. IMPRESSION: Attempt at percutaneous gastrostomy demonstrates insufficient anatomy for image guided placement. The transverse colon did not migrate caudally with insufflation of the stomach. Signed, Yvone Neu. Loreta Ave, DO [facility] Radiology Electronically Signed   By: Gilmer Mor D.O.   On: 10/04/2018 14:25   Ir Removal [employer] W/ [place] W/o Fl Mod Sed  Result Date: 10/14/2018 CLINICAL DATA:  History of head and neck squamous cell carcinoma. Status post right upper chest subcutaneous Port-A-Cath placement on 10/04/2018 with catheter access via the right internal jugular vein. Development Pseudomonas bacteremia now requiring Port-A-Cath removal due to suspected catheter seeding/source for infection. EXAM: REMOVAL OF IMPLANTED TUNNELED PORT-A-CATH MEDICATIONS: None PROCEDURE: The right chest Port-A-Cath site was prepped with chlorhexidine. A sterile gown and gloves were worn during the procedure. Local anesthesia was provided with 1% lidocaine. An incision was made overlying the Port-A-Cath with a #15 scalpel. Utilizing sharp and blunt dissection, the Port-A-Cath was removed. The pocked was debrided with gauze soaked in sterile saline. Iodoform gauze was then packed into the wound. A dressing was then applied over the iodoform gauze. FINDINGS: After making an incision, the port pocket was inspected and demonstrates bloody fluid which is mildly turbulent and felt to be likely infected. Tissue was also indurated and therefore the pocket was left open to heal secondarily. After debridement, iodoform gauze was packed into the port wound. The entire Port-A-Cath and attached catheter were successfully removed. IMPRESSION: Removal of implanted Port-A-Cath utilizing sharp and blunt dissection. The port pocket demonstrates what appears to be likely infected hematoma. After debridement of bloody fluid and clot, the  wound was packed with iodoform gauze. Electronically Signed   By: Irish Lack M.D.   On: 10/14/2018 14:34   Dg Chest Port 1 View  Result Date: 10/18/2018 CLINICAL DATA:  Shortness of breath. EXAM: PORTABLE CHEST 1 VIEW COMPARISON:  10/11/2018. FINDINGS: Interval removal of PowerPort catheter.  Tracheostomy tube noted in stable position. Heart size normal. Right base infiltrate with right-sided pleural effusion. No pneumothorax. IMPRESSION: 1. Interval removal of PowerPort catheter. Tracheostomy tube noted with tip in stable position. 2.  Right base infiltrate with right-sided pleural effusion. Electronically Signed   By: Maisie Fus  Register   On: 10/18/2018 13:35   Dg Chest Port 1 View  Result Date: 10/11/2018 CLINICAL DATA:  Nausea and vomiting yesterday.  New tracheostomy. EXAM: PORTABLE CHEST 1 VIEW COMPARISON:  Chest CT 09/29/2018 FINDINGS: Tracheostomy tube appears in adequate position. Right IJ Port-A-Cath has tip over the SVC. Lungs are adequately inflated without focal airspace consolidation or effusion. No pneumothorax. Cardiomediastinal silhouette is within normal. Dense radiopaque material/calcifications over the posteromedial right lower lobe as seen on CT scan. Remainder of the exam is unchanged. IMPRESSION: No acute cardiopulmonary disease. Tracheostomy tube and right IJ Port-A-Cath in adequate position. Electronically Signed   By: Elberta Fortis M.D.   On: 10/11/2018 15:40   Dg Abd Portable 1v  Result Date: 10/11/2018 CLINICAL DATA:  New tracheostomy tube. Nausea and vomiting yesterday. EXAM: PORTABLE ABDOMEN - 1 VIEW COMPARISON:  10/05/2018 FINDINGS: Percutaneous gastrostomy tube projects over the stomach in the left upper quadrant. Bowel gas pattern is nonobstructive. No free peritoneal air. Remainder of the exam is unchanged. IMPRESSION: Nonobstructive bowel gas pattern. Gastrostomy tube projects over the stomach in the left upper quadrant. Electronically Signed   By: Elberta Fortis M.D.    On: 10/11/2018 15:41   Dg Abd Portable 1v  Result Date: 10/03/2018 CLINICAL DATA:  Dysphagia. EXAM: PORTABLE ABDOMEN - 1 VIEW COMPARISON:  Radiograph of October 02, 2018. FINDINGS: The bowel gas pattern is normal. Residual contrast is noted throughout the colon. No radio-opaque calculi or other significant radiographic abnormality are seen. IMPRESSION: No evidence of bowel obstruction or ileus. Electronically Signed   By: Lupita Raider M.D.   On: 10/03/2018 07:52   Dg Abd Portable 1v  Result Date: 10/02/2018 CLINICAL DATA:  Nasogastric tube advancement. EXAM: PORTABLE ABDOMEN - 1 VIEW COMPARISON:  10/02/2018 at 1839 hours FINDINGS: The nasogastric tube has been advanced. Tip now extends further into the stomach with the side hole entering the proximal stomach. Normal bowel gas pattern. IMPRESSION: 1. Advanced nasogastric tube, now fully within the stomach. Electronically Signed   By: Amie Portland M.D.   On: 10/02/2018 20:45   Ir Imaging Guided Port Insertion  Result Date: 10/04/2018 INDICATION: 73 year old male with a history squamous cell carcinoma and dysphagia EXAM: IMPLANTED PORT A CATH PLACEMENT WITH ULTRASOUND AND FLUOROSCOPIC GUIDANCE MEDICATIONS: 2 g Ancef; The antibiotic was administered within an appropriate time interval prior to skin puncture. ANESTHESIA/SEDATION: Moderate (conscious) sedation was employed during this procedure. A total of Versed 1.0 mg and Fentanyl 25 mcg was administered intravenously. Moderate Sedation Time: 19 minutes. The patient's level of consciousness and vital signs were monitored continuously by radiology nursing throughout the procedure under my direct supervision. FLUOROSCOPY TIME:  0 minutes, 6 seconds (1 mGy) COMPLICATIONS: None PROCEDURE: The procedure, risks, benefits, and alternatives were explained to the patient. Questions regarding the procedure were encouraged and answered. The patient understands and consents to the procedure. Ultrasound survey was  performed with images stored and sent to PACs. The right neck and chest was prepped with chlorhexidine, and draped in the usual sterile fashion using maximum barrier technique (cap and mask, sterile gown, sterile gloves, large sterile sheet, hand hygiene and cutaneous antiseptic). Antibiotic prophylaxis was provided with 2.0g Ancef administered IV one  hour prior to skin incision. Local anesthesia was attained by infiltration with 1% lidocaine without epinephrine. Ultrasound demonstrated patency of the right internal jugular vein, and this was documented with an image. Under real-time ultrasound guidance, this vein was accessed with a 21 gauge micropuncture needle and image documentation was performed. A small dermatotomy was made at the access site with an 11 scalpel. A 0.018" wire was advanced into the SVC and used to estimate the length of the internal catheter. The access needle exchanged for a 34F micropuncture vascular sheath. The 0.018" wire was then removed and a 0.035" wire advanced into the IVC. An appropriate location for the subcutaneous reservoir was selected below the clavicle and an incision was made through the skin and underlying soft tissues. The subcutaneous tissues were then dissected using a combination of blunt and sharp surgical technique and a pocket was formed. A single lumen power injectable portacatheter was then tunneled through the subcutaneous tissues from the pocket to the dermatotomy and the port reservoir placed within the subcutaneous pocket. The venous access site was then serially dilated and a peel away vascular sheath placed over the wire. The wire was removed and the port catheter advanced into position under fluoroscopic guidance. The catheter tip is positioned in the cavoatrial junction. This was documented with a spot image. The portacatheter was then tested and found to flush and aspirate well. The port was flushed with saline followed by 100 units/mL heparinized saline. The  pocket was then closed in two layers using first subdermal inverted interrupted absorbable sutures followed by a running subcuticular suture. The epidermis was then sealed with Dermabond. The dermatotomy at the venous access site was also seal with Dermabond. Patient tolerated the procedure well and remained hemodynamically stable throughout. No complications encountered and no significant blood loss encountered IMPRESSION: Status post port catheter placement.  Catheter ready for use. Signed, Yvone Neu. Reyne Dumas, RPVI [facility] Radiology Electronically Signed   By: Gilmer Mor D.O.   On: 10/04/2018 14:21   US Thoracentesis Asp Pleural Space W/img Guide  Result Date: 10/19/2018 INDICATION: Shortness of breath. Right-sided pleural effusion. Request for diagnostic and therapeutic thoracentesis. EXAM: ULTRASOUND GUIDED RIGHT THORACENTESIS MEDICATIONS: None. COMPLICATIONS: None immediate. PROCEDURE: An ultrasound guided thoracentesis was thoroughly discussed with the patient and questions answered. The benefits, risks, alternatives and complications were also discussed. The patient understands and wishes to proceed with the procedure. Written consent was obtained. Ultrasound was performed to localize and mark an adequate pocket of fluid in the right chest. The area was then prepped and draped in the normal sterile fashion. 1% Lidocaine was used for local anesthesia. Under ultrasound guidance a 6 Fr Safe-T-Centesis catheter was introduced. Thoracentesis was performed. The catheter was removed and a dressing applied. FINDINGS: A total of approximately 450 mL of clear, pale yellow fluid was removed. Samples were sent to the laboratory as requested by the clinical team. IMPRESSION: Successful ultrasound guided right thoracentesis yielding 450 mL of pleural fluid. Read by: Brayton El PA-C Electronically Signed   By: Simonne Come M.D.   On: 10/19/2018 15:29    Lab  Data:  CBC: Recent Labs  Lab 10/19/18 0516 10/20/18 0515 10/21/18 0450 10/22/18 0417 10/23/18 0438 10/24/18 0457  WBC 11.5* 12.1* 10.7* 11.5* 10.9* 9.7  NEUTROABS 7.7 8.7* 7.7  --   --   --   HGB 9.2* 8.9* 8.7* 8.9* 9.0* 9.7*  HCT 31.4* 28.7* 29.3* 30.1* 30.4* 31.2*  MCV 96.3 91.4  93.0 93.5 93.0 91.8  PLT 313 329 316 316 328 294   Basic Metabolic Panel: Recent Labs  Lab 10/19/18 0516 10/20/18 0510  10/21/18 0450 10/22/18 0417 10/23/18 0438 10/24/18 0457 10/25/18 0438  NA 141  --    < > 141 138 138 138 138  K 4.6  --    < > 4.5 4.0 4.2 4.3 4.3  CL 110  --    < > 105 105 104 100 101  CO2 22  --    < > 28 29 27 29 30   GLUCOSE 115*  --    < > 106* 104* 115* 104* 108*  BUN 13  --    < > 16 16 15 15 13   CREATININE 0.59*  --    < > 0.69 0.63 0.66 0.74 0.66  CALCIUM 8.0*  --    < > 8.2* 8.3* 8.3* 8.8* 9.1  MG 2.1 1.8  --  2.2  --   --   --  2.0  PHOS  --   --   --   --   --   --   --  4.4   < > = values in this interval not displayed.   GFR: Estimated Creatinine Clearance: 69.6 mL/min (by C-G formula based on SCr of 0.66 mg/dL). Liver Function Tests: Recent Labs  Lab 10/19/18 0516 10/25/18 0438  AST 36 21  ALT 77* 33  ALKPHOS 82 72  BILITOT 0.3 0.5  PROT 5.2* 6.3*  ALBUMIN 2.1* 2.5*   No results for input(s): LIPASE, AMYLASE in the last 168 hours. No results for input(s): AMMONIA in the last 168 hours. Coagulation Profile: No results for input(s): INR, PROTIME in the last 168 hours. Cardiac Enzymes: No results for input(s): CKTOTAL, CKMB, CKMBINDEX, TROPONINI in the last 168 hours. BNP (last 3 results) No results for input(s): PROBNP in the last 8760 hours. HbA1C: No results for input(s): HGBA1C in the last 72 hours. CBG: Recent Labs  Lab 10/24/18 2037 10/25/18 0151 10/25/18 0620 10/25/18 0750 10/25/18 1214  GLUCAP 103* 107* 96 107* 106*   Lipid Profile: No results for input(s): CHOL, HDL, LDLCALC, TRIG, CHOLHDL, LDLDIRECT in the last 72  hours. Thyroid Function Tests: No results for input(s): TSH, T4TOTAL, FREET4, T3FREE, THYROIDAB in the last 72 hours. Anemia Panel: Recent Labs    10/25/18 0438  VITAMINB12 939*   Urine analysis:    Component Value Date/Time   COLORURINE YELLOW 03/30/2015 1330   APPEARANCEUR CLEAR 03/30/2015 1330   LABSPEC >1.030 (H) 03/30/2015 1330   PHURINE 5.5 03/30/2015 1330   GLUCOSEU NEGATIVE 03/30/2015 1330   HGBUR TRACE (A) 03/30/2015 1330   BILIRUBINUR NEGATIVE 03/30/2015 1330   KETONESUR NEGATIVE 03/30/2015 1330   PROTEINUR NEGATIVE 03/30/2015 1330   NITRITE NEGATIVE 03/30/2015 1330   LEUKOCYTESUR NEGATIVE 03/30/2015 1330     Dejai Schubach M.D. [facility] 10/25/2018, 2:06 PM  Pager: [phone] Between 7am to 7pm - call Pager - [phone]  After 7pm go to [email or web address] - password [email or web address]  Call night coverage person covering after 7pm

## 2018-10-25 NOTE — Progress Notes (Signed)
FL2 completed, PSARR [phone]. Faxed to SNF. Brother in law to call Monday with a SNF name that family would like for him to go to. Pt, wife and brother agreed with faxing FL2 out.

## 2018-10-25 NOTE — Progress Notes (Signed)
Speech Language Pathology Treatment: Dysphagia;Passy Muir Speaking valve  Patient Details Name: Logan Price MRN: [id number] DOB: 1946/01/22 Today's Date: 10/25/2018 Time: 4782-9562 SLP Time Calculation (min) (ACUTE ONLY): 31 min  Assessment / Plan / Recommendation Clinical Impression  Skilled intervention today included initiating swallowing exercises including lingual press to improve submental musculature/laryngeal elevators, effortful swallow to recruit more swallowing related muscle fiber contraction, massaging prior to starting exercise to increase flexibilty of musculature and prolonged /i/ with high pitch to faciliate vagal nerve innervation -laryngeal closure for airway closure with swallow.  Educated pt to need to moisturize oral cavity with toothette/water between effortful swallows.  He does admits pain with swallowing since his trach was placed due to his mass, advised he conduct said exercise after he receives pain medicine to maximize his ability.  Pt able to demonstrate these exercises with mod I after approximately 5 trials.  In addition, removing PMSV reviewed with pt for his safety during use.    Pt removed his valve 6 times with SLP supervision with close attention to securing trach with nondominant hand prior to removal of valve.  He was able to conduct this with mod I by the end of the session, thus SLP recommends he be able to use the valve throughout the day as tolerated.  Pt able to expectorate secretions today with valve in place and NO breath stacking nor complaint of "things building up" reported!  He is making great progress.  Recommend to progress to PMSV usage with intermittent supervision given pt's ability to remove valve.  Educated pt again to benefits of use of valve including physiological benefits of improved secretion management, improved sense of smell and more closely mimicking normal respiratory system.     HPI HPI: Pt is a 73 yo male admitted for planned trach  and laryngeal biopsy 7/2. Biopsy results pending but Neck CT shows extensive laryngeal, pharyngeal, esophageal mass- diagnosed as cancer.  Pt is s/p trach and PEG tube.  He is undergoing radiation tx started 10/22/2018.   Esophagram 6/26 showed silent gross aspiration of barium. PMH includes: HLD, HTN, hepatitis C, GERD, diverticulitis.  Pt has been followed by SLP for dysphagia and pmsv.  He declines to attempt to consume any po intake but is willing to use his pmsv and initiate swallowing exercises.      SLP Plan  Continue with current plan of care       Recommendations  Medication Administration: Via alternative means      Patient may use Passy-Muir Speech Valve: During all waking hours (remove during sleep) PMSV Supervision: Intermittent         Oral Care Recommendations: Oral care BID Follow up Recommendations: Outpatient SLP;Home health SLP SLP Visit Diagnosis: Aphonia (R49.1);Dysphagia, unspecified (R13.10) Plan: Continue with current plan of care       GO              Donavan Burnet, MS [facility] SLP Acute Rehab Services Pager [phone] Office [phone]   Chales Abrahams 10/25/2018, 9:30 AM

## 2018-10-25 NOTE — [place] FL2 (Signed)
[place] MEDICAID FL2 LEVEL OF CARE SCREENING TOOL     IDENTIFICATION  Patient Name: Logan Price Birthdate: 12-Aug-1945 Sex: male Admission Date (Current Location): 09/27/2018  [facility] and [place] Number:  Producer, television/film/video and Address:  [facility],  [address], [place] [address]      Provider Number: [id number]  Attending Physician Name and Address:  Cathren Harsh, MD  Relative Name and Phone Number:       Current Level of Care: Hospital Recommended Level of Care: Skilled Nursing Facility Prior Approval Number:    Date Approved/Denied:   PASRR Number:    Discharge Plan: SNF    Current Diagnoses: Patient Active Problem List   Diagnosis Date Noted  . Pleural effusion   . Shortness of breath   . 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HCC)   . Bacteremia   . Leukocytosis   . Normocytic anemia   . Protein-calorie malnutrition, severe 10/08/2018  . PSVT (paroxysmal supraventricular tachycardia) (HCC) 10/03/2018  . Atrial fibrillation (HCC) 10/03/2018  . Goals of care, counseling/discussion   . Dysphagia   . Loss of weight   . Laryngeal mass 09/27/2018  . 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 Gastritis determined by endoscopy   . Esophageal dysphagia   . 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 Globus sensation 12/12/2016  . Smokes with greater than 40 pack year history 10/20/2016  . Hemorrhoids, internal, with bleeding 10/06/2015  . History of adenomatous polyp of colon 04/14/2015  . Vitamin D deficiency 10/09/2014  . HLD (hyperlipidemia) 10/08/2012  . GERD (gastroesophageal reflux disease) 10/08/2012  . HTN (hypertension)   . Diverticulitis 10/25/2009    Orientation RESPIRATION BLADDER Height & Weight     Self, Time, Situation, Place  Tracheostomy, O2(#6 cuffed shiley, 5L O2, FIO2 28) Incontinent, Indwelling catheter(acute urinary retention) Weight: 58.1 kg Height:  5\' 9"  (175.3 cm)  BEHAVIORAL SYMPTOMS/MOOD  NEUROLOGICAL BOWEL NUTRITION STATUS      Continent Feeding tube(G tube/NPO)  AMBULATORY STATUS COMMUNICATION OF NEEDS Skin   Limited Assist   Other (Comment)(Stage 2 on buttock, foam dressing)                       Personal Care Assistance Level of Assistance  Bathing, Feeding, Dressing Bathing Assistance: Limited assistance Feeding assistance: Limited assistance Dressing Assistance: Limited assistance     Functional Limitations Info             SPECIAL CARE FACTORS FREQUENCY  PT (By licensed PT), OT (By licensed OT)     PT Frequency: 5x OT Frequency: 5x            Contractures      Additional Factors Info  Code Status, Allergies Code Status Info: FULL Allergies Info: No Known Allergies           Current Medications (10/25/2018):  This is the current hospital active medication list Current Facility-Administered Medications  Medication Dose Route Frequency Provider Last Rate Last Dose  . 0.9 %  sodium chloride infusion   Intravenous PRN [name], MD   Stopped at 10/19/18 1736  . alum & mag hydroxide-simeth (MAALOX/MYLANTA) 200-200-20 MG/5ML suspension 30 mL  30 mL Oral Q4H PRN [name], MD      . amLODipine (NORVASC) tablet 5 mg  5 mg Per Tube Daily [name], MD   5 mg at 10/25/18 0954  . ceFEPIme (MAXIPIME) 2 g in sodium  chloride 0.9 % 100 mL IVPB  2 g Intravenous Q8H Green, Terri L, RPH 200 mL/hr at 10/25/18 0647 2 g at 10/25/18 0647  . chlorhexidine (PERIDEX) 0.12 % solution 15 mL  15 mL Mouth Rinse BID [name], MD   15 mL at 10/24/18 2236  . doxazosin (CARDURA) tablet 1 mg  1 mg Per Tube Daily [name], MD   1 mg at 10/25/18 0954  . feeding supplement (PRO-STAT SUGAR FREE 64) liquid 30 mL  30 mL Per Tube BID Rai, Ripudeep K, MD   30 mL at 10/25/18 0953  . free water 200 mL  200 mL Per Tube Q4H Rai, Ripudeep K, MD   200 mL at 10/25/18 1300  . heparin injection 5,000 Units  5,000 Units Subcutaneous Q8H  [name], MD   5,000 Units at 10/25/18 1300  . hydrALAZINE (APRESOLINE) injection 10 mg  10 mg Intravenous Q4H PRN [name], MD      . hydrocortisone (ANUSOL-HC) 2.5 % rectal cream 1 application  1 application Topical QID PRN [name], MD      . hydrocortisone (ANUSOL-HC) 2.5 % rectal cream   Rectal QID [name], MD      . hydrocortisone cream 1 % 1 application  1 application Topical TID PRN [name], MD      . ipratropium-albuterol (DUONEB) 0.5-2.5 (3) MG/3ML nebulizer solution 3 mL  3 mL Nebulization Q4H PRN [name], MD      . Molli Posey Peptide 1.5 LIQD 325 mL  325 mL PEG Tube Continuous Rai, Ripudeep K, MD 60 mL/hr at 10/24/18 1654 30 mL at 10/24/18 1654  . lactated ringers infusion   Intravenous Continuous [name], MD 10 mL/hr at 10/07/18 [phone]    . lidocaine (XYLOCAINE) 2 % jelly 1 application  1 application Urethral Once Teoh, Su, MD      . lip balm (CARMEX) ointment 1 application  1 application Topical PRN [name], MD      . loperamide HCl (IMODIUM) 1 MG/7.5ML suspension 2 mg  2 mg Per Tube PRN [name], MD      . loratadine (CLARITIN) tablet 10 mg  10 mg Oral Daily PRN [name], MD      . MEDLINE mouth rinse  15 mL Mouth Rinse q12n4p [name], MD   15 mL at 10/23/18 1034  . metoprolol tartrate (LOPRESSOR) 25 mg/10 mL oral suspension 50 mg  50 mg Per Tube QID [name], MD   50 mg at 10/25/18 [phone]  . metoprolol tartrate (LOPRESSOR) injection 2.5 mg  2.5 mg Intravenous Q6H PRN [name], MD   2.5 mg at 10/19/18 1415  . morphine 2 MG/ML injection 2-4 mg  2-4 mg Intravenous Q2H PRN [name], MD      . Muscle Rub CREA 1 application  1 application Topical PRN [name], MD      . ondansetron [facility]) injection 4 mg  4 mg Intravenous Q6H PRN [name], MD   4 mg at 10/20/18 1808  . pantoprazole sodium (PROTONIX) 40 mg/20 mL oral suspension  40 mg  40 mg Per Tube Daily [name], MD   40 mg at 10/25/18 0954  . phenol (CHLORASEPTIC) mouth spray 1 spray  1 spray Mouth/Throat PRN [name], MD   1 spray at 10/18/18 2220  . polyethylene glycol (MIRALAX / GLYCOLAX)  packet 17 g  17 g Oral Daily PRN [name], MD      . polyvinyl alcohol (LIQUIFILM TEARS) 1.4 % ophthalmic solution 1 drop  1 drop Both Eyes PRN [name], MD      . promethazine (PHENERGAN) injection 12.5 mg  12.5 mg Intravenous Q6H PRN [name], MD      . scopolamine (TRANSDERM-SCOP) 1 MG/3DAYS 1.5 mg  1 patch Transdermal Q72H [name], MD   1.5 mg at 10/25/18 0954  . senna-docusate (Senokot-S) tablet 2 tablet  2 tablet Oral QHS PRN [name], MD   2 tablet at 10/23/18 2235  . simethicone (MYLICON) chewable tablet 160 mg  160 mg Per Tube QID [name], MD   160 mg at 10/25/18 0953  . simvastatin (ZOCOR) tablet 20 mg  20 mg Per Tube q1800 [name], MD   20 mg at 10/24/18 1816  . sodium chloride (OCEAN) 0.65 % nasal spray 1 spray  1 spray Each Nare PRN [name], MD      . sodium chloride 0.9 % bolus 500 mL  500 mL Intravenous Once [name], MD 500 mL/hr at 10/14/18 2049       Discharge Medications: Please see discharge summary for a list of discharge medications.  Relevant Imaging Results:  Relevant Lab Results:   Additional Information [id number], Pt receiving Radiation treatment 5x a week for 7 weeks. Trach, G tube, Port-A-Cath, and Foley Cath. Pt may discharge with IV Antibiotics.  Geni Bers, RN

## 2018-10-26 LAB — VITAMIN D 25 HYDROXY (VIT D DEFICIENCY, FRACTURES): Vit D, 25-Hydroxy: 42.5 ng/mL (ref 30.0–100.0)

## 2018-10-26 LAB — GLUCOSE, CAPILLARY
Glucose-Capillary: 108 mg/dL — ABNORMAL HIGH (ref 70–99)
Glucose-Capillary: 113 mg/dL — ABNORMAL HIGH (ref 70–99)
Glucose-Capillary: 128 mg/dL — ABNORMAL HIGH (ref 70–99)
Glucose-Capillary: 85 mg/dL (ref 70–99)
Glucose-Capillary: 88 mg/dL (ref 70–99)

## 2018-10-26 NOTE — Progress Notes (Signed)
CRITICAL VALUE ALERT  Critical Value:  5 beat run of VT  Date & Time Notied:  10/26/2018 6:49 AM   Provider Notified: Jeannette Corpus  Orders Received/Actions taken: None at this time

## 2018-10-26 NOTE — Progress Notes (Signed)
[facility]                                                                              Patient Demographics  Logan Price, is a 73 y.o. male, DOB - 1945/10/28, IRS:[id number]  Admit date - 09/27/2018   Admitting Physician Logan Bong, MD  Outpatient Primary MD for the patient is Logan Spencer, FNP  Outpatient specialists:   LOS - 29  days   Medical records reviewed and are as summarized below:    No chief complaint on file.      Brief summary  73 year old with history of GERD, essential hypertension, hyperlipidemia who recently diagnosed neck mass concerning for malignancy was admitted to the hospital. Overnight patient went into SVT therefore medical team was consulted. Patient was given adenosine x1 which converted back to normal sinus rhythm. G-tube placed by surgery 7/13, tolerating tube feeds. Metoprolol was started via PEG tube. Hospitals course was later on complicated by Pseudomonas bacteremia secondary to infected Port-A-Cath. Patient was transferred to medicine service. Infectious disease team were consulted who recommended removing Port-A-Cath.    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  Trach care per ENT. -Per patient, on 7 weeks radiation cycles, currently stable, plan for skilled nursing facility when bed available  Active problems Pseudomonas bacteremia -Likely source Port-A-Cath,  removed on 10/14/2018. -ID recommended IV cefepime for total 2 weeks post Port-A-Cath removal, end date on 10/29/2018 -Repeat blood cultures negative till date -Continue IV cefepime  Acute kidney injury secondary to acute urinary retention -Patient had over 1 L of urine in his bladder, creatinine 5.3 on 7/20, after placing Foley catheter, creatinine has been steadily improving -Creatinine stable 0.6, continue Foley catheter, had failed voiding trial and Foley catheter had to be placed back in -Patient will need urology outpatient follow-up for voiding trial and further evaluation.  Paroxysmal SVT, intermittent and recurrent -Patient had 25 beats run of nonsustained V. tach on 10/20/2018 a.m. -Patient was seen by cardiology, currently signed off.  2D echo normal, TSH within normal limits -Keep potassium ~4, magnesium above 2  Transudative pleural effusions, pneumonia, shortness of breath -Patient had complained of dyspnea on 7/24, intermittent, noticed to be 8.9 L positive during hospitalization.  Chest x-ray showed a right-sided pleural effusion with concerns for right basilar infiltrate -Ultrasound-guided thoracentesis done on 7/25 with 450 cc of clear fluid removed, transudative -Urine Legionella antigen negative, urine strep antigen negative -IV vancomycin, Zithromax has been discontinued.  Continue IV cefepime  Diarrhea -Likely secondary to tube feeds, C. difficile PCR negative -Improving  Code Status: Full CODE STATUS DVT Prophylaxis: Heparin subcu Family Communication: Discussed in detail with the patient, all imaging  results, lab results explained to the patient and patient's wife on phone yesterday, agreeable for skilled nursing facility.   Disposition Plan: Discussed in detail with the patient, will finish antibiotics on 8/4, undergoing XRT.  Agreeable for skilled nursing facility, social work consult placed.  Time Spent in minutes 25 minutes  Procedures:   Percutaneous  placement of gastrostomy tube 10/04/2018 per Dr. Ardelle Anton interventional radiology  Port-A-Cath removal per interventional radiology 10/14/2018  Ultrasound-guided thoracentesis 10/19/2018 per Brayton El, PA--- 450 cc of clear pale yellow fluid removed.  Consultants:    Oncology: Dr. Dion Body 09/30/2018  Interventional radiology: Dr. Ardelle Anton 10/02/2018  [facility]: Dr. Julian Reil 10/03/2018  General surgery: Dr. Luisa Hart 10/06/2018  Dental surgery: Dr. Valentino Hue 10/08/2018  Cardiology: Dr. Wyline Mood 10/13/2018  Infectious disease: Dr. Daiva Eves 10/13/2018  Radiation oncology    Antimicrobials:   Anti-infectives (From admission, onward)   Start     Dose/Rate Route Frequency Ordered Stop   10/19/18 0400  vancomycin (VANCOCIN) IVPB 750 mg/150 ml premix  Status:  Discontinued     750 mg 150 mL/hr over 60 Minutes Intravenous Every 12 hours 10/18/18 1541 10/20/18 1006   10/18/18 2000  ceFEPIme (MAXIPIME) 2 g in sodium chloride 0.9 % 100 mL IVPB     2 g 200 mL/hr over 30 Minutes Intravenous Every 8 hours 10/18/18 1243 10/30/18 0559   10/18/18 1600  azithromycin (ZITHROMAX) 500 mg in sodium chloride 0.9 % 250 mL IVPB     500 mg 250 mL/hr over 60 Minutes Intravenous Every 24 hours 10/18/18 1438 10/22/18 2252   10/18/18 1600  vancomycin (VANCOCIN) 1,250 mg in sodium chloride 0.9 % 250 mL IVPB     1,250 mg 166.7 mL/hr over 90 Minutes Intravenous  Once 10/18/18 1516 10/19/18 0100   10/15/18 1145  ceFEPIme (MAXIPIME) 2 g in sodium chloride 0.9 % 100 mL IVPB  Status:  Discontinued     2 g 200 mL/hr over 30 Minutes Intravenous Every 12 hours 10/15/18 1135 10/18/18 1243   10/14/18 1100  ceFEPIme (MAXIPIME) 2 g in sodium chloride 0.9 % 100 mL IVPB  Status:  Discontinued     2 g 200 mL/hr over 30 Minutes Intravenous Every 24 hours 10/13/18 1248 10/15/18 1135   10/14/18 0000  ceFAZolin (ANCEF) IVPB 2g/100 mL premix     2 g 200 mL/hr over 30 Minutes Intravenous To Radiology 10/13/18 1216 10/14/18 0907    10/12/18 0445  ceFEPIme (MAXIPIME) 2 g in sodium chloride 0.9 % 100 mL IVPB  Status:  Discontinued     2 g 200 mL/hr over 30 Minutes Intravenous Every 12 hours 10/12/18 0439 10/13/18 1248   10/11/18 1100  levofloxacin (LEVAQUIN) IVPB 750 mg  Status:  Discontinued     750 mg 100 mL/hr over 90 Minutes Intravenous Every 24 hours 10/11/18 0956 10/12/18 0439   10/04/18 1330  ceFAZolin (ANCEF) powder 1 g  Status:  Discontinued     1 g Other To Surgery 10/04/18 1317 10/04/18 1454   10/04/18 1315  ceFAZolin (ANCEF) 2-4 GM/100ML-% IVPB    Note to Pharmacy: Unk Lightning  : cabinet override      10/04/18 1315 10/04/18 1317   10/03/18 0800  ceFAZolin (ANCEF) IVPB 2g/100 mL premix     2 g 200 mL/hr over 30 Minutes Intravenous To Radiology 10/02/18 0901 10/04/18 1346   09/27/18 1500  clindamycin (CLEOCIN) IVPB 600 mg     600 mg 100 mL/hr over 30 Minutes Intravenous Every 8 hours 09/27/18 1400  09/28/18 0644         Medications  Scheduled Meds:  amLODipine  5 mg Per Tube Daily   chlorhexidine  15 mL Mouth Rinse BID   doxazosin  1 mg Per Tube Daily   feeding supplement (PRO-STAT SUGAR FREE 64)  30 mL Per Tube BID   free water  200 mL Per Tube Q4H   heparin injection (subcutaneous)  5,000 Units Subcutaneous Q8H   hydrocortisone   Rectal QID   lidocaine  1 application Urethral Once   mouth rinse  15 mL Mouth Rinse q12n4p   metoprolol tartrate  50 mg Per Tube QID   pantoprazole sodium  40 mg Per Tube Daily   scopolamine  1 patch Transdermal Q72H   simethicone  160 mg Per Tube QID   simvastatin  20 mg Per Tube q1800   Continuous Infusions:  sodium chloride Stopped (10/19/18 1736)   ceFEPime (MAXIPIME) IV 2 g (10/26/18 1610)   Molli Posey Peptide 1.5 325 mL (10/25/18 1833)   lactated ringers 10 mL/hr at 10/07/18 0859   sodium chloride 500 mL/hr at 10/14/18 2049   PRN Meds:.sodium chloride, alum & mag hydroxide-simeth, hydrALAZINE, hydrocortisone, hydrocortisone  cream, ipratropium-albuterol, lip balm, loperamide HCl, loratadine, metoprolol tartrate, morphine injection, Muscle Rub, ondansetron (ZOFRAN) IV, phenol, polyethylene glycol, polyvinyl alcohol, promethazine, senna-docusate, sodium chloride      Subjective:   Ioane Constanza was seen and examined today.  No acute complaints today.  No fevers, chest pain, shortness of breath or any abdominal pain.    Objective:   Vitals:   10/26/18 0553 10/26/18 0757 10/26/18 1127 10/26/18 1146  BP: (!) 109/44  130/67   Pulse: 74 74  84  Resp: 18 18  18   Temp: 98.5 F (36.9 C)     TempSrc: Oral     SpO2: 98% 97%  94%  Weight:      Height:        Intake/Output Summary (Last 24 hours) at 10/26/2018 1155 Last data filed at 10/26/2018 0800 Gross per 24 hour  Intake 96045 ml  Output 3075 ml  Net 67795 ml     Wt Readings from Last 3 Encounters:  10/25/18 58.1 kg  09/11/18 63.2 kg  04/25/18 70.4 kg    Physical Exam  General: Alert and oriented x 3, NAD  Eyes:   HEENT: Trach+  Cardiovascular: S1 S2 clear, no murmurs, RRR. No pedal edema b/l  Respiratory: CTAB, no wheezing, rales or rhonchi  Gastrointestinal: Soft, nontender, nondistended, NBS  Ext: no pedal edema bilaterally  Neuro: no new deficits  Musculoskeletal: No cyanosis, clubbing  Skin: No rashes  Psych: Normal affect and demeanor, alert and oriented x3    Data Reviewed:  I have personally reviewed following labs and imaging studies  Micro Results Recent Results (from the past 240 hour(s))  SARS Coronavirus 2 (CEPHEID - Performed in [facility] hospital lab), Hosp Order     Status: None   Collection Time: 10/18/18  5:01 PM   Specimen: Nasopharyngeal Swab  Result Value Ref Range Status   SARS Coronavirus 2 NEGATIVE NEGATIVE Final    Comment: (NOTE) If result is NEGATIVE SARS-CoV-2 target nucleic acids are NOT DETECTED. The SARS-CoV-2 RNA is generally detectable in upper and lower  respiratory specimens during the  acute phase of infection. The lowest  concentration of SARS-CoV-2 viral copies this assay can detect is 250  copies / mL. A negative result does not preclude SARS-CoV-2 infection  and should not be  used as the sole basis for treatment or other  patient management decisions.  A negative result may occur with  improper specimen collection / handling, submission of specimen other  than nasopharyngeal swab, presence of viral mutation(s) within the  areas targeted by this assay, and inadequate number of viral copies  (<250 copies / mL). A negative result must be combined with clinical  observations, patient history, and epidemiological information. If result is POSITIVE SARS-CoV-2 target nucleic acids are DETECTED. The SARS-CoV-2 RNA is generally detectable in upper and lower  respiratory specimens dur ing the acute phase of infection.  Positive  results are indicative of active infection with SARS-CoV-2.  Clinical  correlation with patient history and other diagnostic information is  necessary to determine patient infection status.  Positive results do  not rule out bacterial infection or co-infection with other viruses. If result is PRESUMPTIVE POSTIVE SARS-CoV-2 nucleic acids MAY BE PRESENT.   A presumptive positive result was obtained on the submitted specimen  and confirmed on repeat testing.  While 2019 novel coronavirus  (SARS-CoV-2) nucleic acids may be present in the submitted sample  additional confirmatory testing may be necessary for epidemiological  and / or clinical management purposes  to differentiate between  SARS-CoV-2 and other Sarbecovirus currently known to infect humans.  If clinically indicated additional testing with an alternate test  methodology [phone]) is advised. The SARS-CoV-2 RNA is generally  detectable in upper and lower respiratory sp ecimens during the acute  phase of infection. The expected result is Negative. Fact Sheet for Patients:   BoilerBrush.com.cy Fact Sheet for Healthcare Providers: https://pope.com/ This test is not yet approved or cleared by the [place] FDA and has been authorized for detection and/or diagnosis of SARS-CoV-2 by FDA under an Emergency Use Authorization (EUA).  This EUA will remain in effect (meaning this test can be used) for the duration of the COVID-19 declaration under Section 564(b)(1) of the Act, 21 U.S.C. section 360bbb-3(b)(1), unless the authorization is terminated or revoked sooner. Performed at [facility], [address], [place], [place] [address]   C difficile quick scan w PCR reflex     Status: None   Collection Time: 10/18/18  8:32 PM   Specimen: STOOL  Result Value Ref Range Status   C Diff antigen NEGATIVE NEGATIVE Final   C Diff toxin NEGATIVE NEGATIVE Final   C Diff interpretation No C. difficile detected.  Final    Comment: Performed at [facility], [address], [place], [place] [address]  Culture, body fluid-bottle     Status: None   Collection Time: 10/19/18  3:20 PM   Specimen: Pleura  Result Value Ref Range Status   Specimen Description PLEURAL  Final   Special Requests NONE  Final   Culture   Final    NO GROWTH 5 DAYS Performed at [facility], [address], [place], [place] [address]    Report Status 10/24/2018 FINAL  Final  Gram stain     Status: None   Collection Time: 10/19/18  3:20 PM   Specimen: Pleura  Result Value Ref Range Status   Specimen Description PLEURAL  Final   Special Requests NONE  Final   Gram Stain   Final    RARE WBC PRESENT, PREDOMINANTLY PMN NO ORGANISMS SEEN Performed at [facility], [address], [place], [place] [address]    Report Status 10/20/2018 FINAL  Final    Radiology Reports Ct Abdomen  Wo Contrast  Result Date: 10/01/2018 CLINICAL DATA:  73 year old male undergoing evaluation for gastrostomy tube  placement. EXAM: CT ABDOMEN WITHOUT CONTRAST TECHNIQUE: Multidetector CT imaging of the abdomen was performed following the standard protocol without IV contrast. COMPARISON:  Prior abdominal ultrasound 12/25/2016 FINDINGS: Lower chest: Mild lower lobe bronchial wall thickening. Trace atelectasis versus infiltrate in the dependent portion of the right lower lobe. No suspicious pulmonary nodule or mass. The visualized heart is normal in size. Unremarkable distal thoracic esophagus. Hepatobiliary: Normal hepatic contour and morphology. No discrete hepatic lesion. The gallbladder lumen is filled with high density material likely representing sludge and/or small stones. No biliary ductal dilatation. Pancreas: Unremarkable. No pancreatic ductal dilatation or surrounding inflammatory changes. Spleen: Normal in size without focal abnormality. Adrenals/Urinary Tract: Normal adrenal glands. Circumscribed low-attenuation lesions within the right kidney are incompletely characterized in the absence of intravenous contrast. However, a sonographically simple cyst was previously identified in the lower pole. No evidence of hydronephrosis or nephrolithiasis. Stomach/Bowel: High-riding transverse colon which lies anterior to the gastric body and antrum. Otherwise, normal gastric anatomy. No focal bowel wall thickening or evidence of obstruction. Vascular/Lymphatic: Limited evaluation in the absence of intravenous contrast. Extensive atherosclerotic vascular calcifications. No aneurysm. Other: No ascites or abdominal wall hernia. Musculoskeletal: No acute fracture or aggressive appearing lytic or blastic osseous lesion. IMPRESSION: 1. High-riding transverse colon which lies anterior to the gastric body and antrum. Percutaneous gastrostomy tube placement may be possible, however will require good visualization of the transverse colon at the time of attempted procedure. Consider administration of barium prior to the procedure. 2.   Aortic Atherosclerosis (ICD10-170.0). 3. Right lower lobe atelectasis versus changes of small volume aspiration. 4. Sludge and/or small stones in the gallbladder lumen. Electronically Signed   By: Malachy Moan M.D.   On: 10/01/2018 14:18   Dg Orthopantogram  Result Date: 10/08/2018 CLINICAL DATA:  Poor dentition. Preoperative for laryngeal carcinoma. EXAM: ORTHOPANTOGRAM/PANORAMIC COMPARISON:  None. FINDINGS: Missing LEFT maxillary teeth from a first bicuspid through distal molar. Missing RIGHT maxillary teeth from second bicuspid through distal molar. Missing RIGHT mandibular teeth from first through distal molar. Missing LEFT mandibular first molar. No periapical lucency to suggest abscess. No definite caries. IMPRESSION: 1. Numerous missing mandibular and maxillary teeth, as detailed above. 2. No evidence of periapical dental abscess. Electronically Signed   By: Bary Richard M.D.   On: 10/08/2018 13:27   Dg Chest 1 View  Result Date: 10/19/2018 CLINICAL DATA:  S/P right thoracentesis. EXAM: CHEST  1 VIEW COMPARISON:  10/18/2018 FINDINGS: Tracheostomy tube is unchanged. The heart size is normal. Small RIGHT pleural effusion, decreased since prior study. No pneumothorax. Minimal bibasilar atelectasis. IMPRESSION: Decreased RIGHT pleural effusion. No pneumothorax. Electronically Signed   By: Norva Pavlov M.D.   On: 10/19/2018 15:43   Dg Abd 1 View  Result Date: 10/05/2018 CLINICAL DATA:  Is a gastric tube placement. EXAM: ABDOMEN - 1 VIEW COMPARISON:  October 04, 2018 FINDINGS: Nonobstructive bowel gas pattern. Residual contrast throughout the colon and in the appendix. Nonobstructive bowel gas pattern. Enteric catheter overlies the expected location of the gastric cardia with side hole at the expected location of the GE junction. IMPRESSION: Enteric catheter overlies the expected location of the gastric cardia with side hole at the expected location of the GE junction. Electronically Signed   By:  Ted Mcalpine M.D.   On: 10/05/2018 15:55   Dg Abd 1 View  Result Date: 10/02/2018 CLINICAL DATA:  Enteric tube placement.  EXAM: ABDOMEN - 1 VIEW COMPARISON:  CT abdomen from yesterday. FINDINGS: Enteric tube tip in the stomach with the proximal side port in the lower esophagus. Nonobstructive bowel gas pattern. No acute osseous abnormality. IMPRESSION: 1. Enteric tube tip in the stomach with proximal side port in the lower esophagus. Recommend advancing 7-8 cm. Electronically Signed   By: Obie Dredge M.D.   On: 10/02/2018 18:59   Ct Soft Tissue Neck W Contrast  Result Date: 09/28/2018 CLINICAL DATA:  73 year old male status post tracheostomy and laryngeal biopsy postoperative day 1. Findings suspicious for squamous cell carcinoma. EXAM: CT NECK WITH CONTRAST TECHNIQUE: Multidetector CT imaging of the neck was performed using the standard protocol following the bolus administration of intravenous contrast. CONTRAST:  75mL OMNIPAQUE IOHEXOL 300 MG/ML  SOLN COMPARISON:  Neck CT 02/22/2018. FINDINGS: Pharynx and larynx: Tracheostomy has been placed, and there is a small volume of soft tissue gas tracking in the retropharyngeal space, in the bilateral neck, and into the visible mediastinum. The tube appears well positioned. There is debris in the cricoid above the tube. Trace retained secretions or debris in the trachea below the tube. There is an infiltrative and fungating heterogeneously enhancing mass now occupying the larynx, chiefly at and above the glottis, but also possibly infiltrating and involving the cervical esophagus below the glottis (series 3, image 80). There is extension through the right thyroid cartilage and contiguous appearing tumor - probably ex nodal disease - in the right lower neck lateral to the carotid space (series 3, image 64). There is associated narrowing of the right IJ at that level, which remains patent. All told, the poorly marginated hyperenhancing tumor encompasses  35 x 76 x 66 millimeters (AP by transverse by CC). See series 3, image 63, 64, 74, and coronal images 51 through 54. Tumor extends to the AE folds and the mucosa hypopharynx is generally hyperenhancing. The oropharynx and nasopharynx are spared. Negative superior parapharyngeal spaces, the right lower paralaryngeal spaces are abnormal. The retropharyngeal space contains gas from the tracheostomy. Salivary glands: Negative sublingual space. The submandibular glands and parotid glands remain within normal limits. Thyroid: Postoperative changes from tracheostomy, but the posterior right thyroid lobe also appears abutted by tumor on series 3, image 84. Lymph nodes: Extensive right level 3 Aks nodal disease suspected, resulting in a semi contiguous appearance of tumor from the level 3 station into the larynx. Malignant right level 2A lymph nodes measuring 16 millimeter short axis on series 3, image 47. No level 1 node involvement. No abnormal level 4 nodes are evident. There are small but conspicuous left level 2 B (series 3, image 40) and level IIIb (image 61) lymph nodes which are larger than in 2019, up to 7 millimeters diameter. Vascular: Mass effect on the right internal jugular vein from tumor in the right lower neck, but the vein remains patent. Tumor surrounds the right lower carotid space but the right carotid remains patent with calcified atherosclerosis. Other major vascular structures in the neck and at the skull base are patent. Limited intracranial: Negative. Visualized orbits: Negative. Mastoids and visualized paranasal sinuses: Clear. Skeleton: Hyperenhancing tumor appears to occupy the prevertebral space in the lower cervical spine from C4-C5 through C7-T1, and probably is associated with cervical esophageal involvement. See series 3, image 80 and sagittal image 52. No bone erosion identified. No lytic or suspicious osseous lesion identified elsewhere. Upper chest: Centrilobular emphysema. Stable small  right apical nodules on series 9, image 88, and left apically nodule on  image 94, which are likely related to lung scarring. Small volume pneumomediastinum following tracheostomy tube placement. Distal to the tube the trachea appears patent with some retained secretions. No superior mediastinal lymphadenopathy. Calcified aortic atherosclerosis. IMPRESSION: 1. Infiltrative and poorly marginated enhancing tumor in the lower neck eccentric to the right appears to involve the larynx, the adjacent cervical esophagus, and is somewhat contiguous with ex-nodal disease in the right level 3 station, surrounding the right carotid space and narrowing the right IJ which remains patent. - all told tumor encompasses 35 x 76 x 66 mm (AP by transverse by CC) - prevertebral space involvement suspected C4-C5 through C7-T1 but no underlying bony changes. - malignant right level 2 nodal disease, and small but suspicious contralateral left level 2 and left level 3 nodes. 2. Status post tracheostomy with small volume pneumomediastinum and soft tissue gas in the neck. The airway appears patent below the tracheostomy tube and no adverse features are identified. Electronically Signed   By: Odessa Fleming M.D.   On: 09/28/2018 17:34   Ct Chest W Contrast  Result Date: 09/29/2018 CLINICAL DATA:  Neck mass, advanced neck cancer, status post tracheostomy. EXAM: CT CHEST WITH CONTRAST TECHNIQUE: Multidetector CT imaging of the chest was performed during intravenous contrast administration. CONTRAST:  75mL OMNIPAQUE IOHEXOL 300 MG/ML  SOLN COMPARISON:  09/28/2018 neck CT. FINDINGS: Cardiovascular: Normal heart size. No significant pericardial effusion/thickening. Three-vessel coronary atherosclerosis. Atherosclerotic nonaneurysmal thoracic aorta. Normal caliber pulmonary arteries. No central pulmonary emboli. Mediastinum/Nodes: No discrete thyroid nodules. Normal thoracic esophagus. Infiltrative right neck mass involving larynx and cervical esophagus,  as detailed on neck CT from 1 day prior. Subcutaneous emphysema scattered in the lower neck bilaterally. Scattered pneumomediastinum throughout the bilateral paratracheal and paraesophageal regions. No pathologically enlarged axillary, mediastinal or hilar lymph nodes. Lungs/Pleura: No pneumothorax. No pleural effusion. Moderate centrilobular emphysema with diffuse bronchial wall thickening. Tracheostomy tube tip is in the tracheal lumen just below thoracic inlet. No acute consolidative airspace disease or lung masses. Scattered calcified granulomas throughout the medial right lower lobe. A few scattered solid pulmonary nodules in the upper lobes bilaterally, predominantly at the lung apices, largest 6 mm at the apical left upper lobe (series 5/image 17) and 5 mm in the apical right upper lobe (series 5/image 18), all stable since 02/22/2018 CT. Upper abdomen: No acute abnormality. Musculoskeletal: No aggressive appearing focal osseous lesions. Mild symmetric bilateral gynecomastia. Moderate thoracic spondylosis. IMPRESSION: 1. No thoracic adenopathy or other findings suspicious for metastatic disease in the chest. 2. Bilateral upper lobe solid pulmonary nodules, largest 6 mm, all stable since 02/22/2018 neck CT, more likely benign. Suggest follow-up chest CT in 12 months to demonstrate continued stability. 3. Infiltrative right neck mass involving the larynx and cervical esophagus, as detailed on neck CT from 1 day prior. 4. Well-positioned tracheostomy tube. 5. Three-vessel coronary atherosclerosis. Aortic Atherosclerosis (ICD10-I70.0) and Emphysema (ICD10-J43.9). Electronically Signed   By: Delbert Phenix M.D.   On: 09/29/2018 13:41   Ir Gastrostomy Tube Mod Sed  Result Date: 10/04/2018 INDICATION: 73 year old male with dysphagia EXAM: PERC PLACEMENT GASTROSTOMY MEDICATIONS: 2 g Ancef ANESTHESIA/SEDATION: Versed 0.5 mg IV; Fentanyl 25 mcg IV Moderate Sedation Time:  0 The patient was continuously monitored  during the procedure by the interventional radiology nurse under my direct supervision. CONTRAST:  None FLUOROSCOPY TIME:  Fluoroscopy Time: 0 minutes 30 seconds (4 mGy). COMPLICATIONS: None immediate. PROCEDURE: Informed written consent was obtained from the patient's family after a thorough discussion of the procedural risks, benefits  and alternatives. All questions were addressed. Maximal Sterile Barrier Technique was utilized including caps, mask, sterile gowns, sterile gloves, sterile drape, hand hygiene and skin antiseptic. A timeout was performed prior to the initiation of the procedure. The epigastrium was prepped with Betadine in a sterile fashion, and a sterile drape was applied covering the operative field. A sterile gown and sterile gloves were used for the procedure. A 5-French orogastric tube is placed under fluoroscopic guidance. Scout imaging of the abdomen confirms barium within the transverse colon. The stomach was distended with gas. Multiple x-ray performed with insufflation of the stomach. The partially barium filled colon did not migrate caudally with insufflation of the stomach. X-rays were performed. We withdrew from the procedure given the anatomy. Patient tolerated the procedure well and remained hemodynamically stable throughout. No complications were encountered and no significant blood loss encountered. IMPRESSION: Attempt at percutaneous gastrostomy demonstrates insufficient anatomy for image guided placement. The transverse colon did not migrate caudally with insufflation of the stomach. Signed, Yvone Neu. Loreta Ave, DO [facility] Electronically Signed   By: Gilmer Mor D.O.   On: 10/04/2018 14:25   Ir Removal [employer] W/ [place] W/o Fl Mod Sed  Result Date: 10/14/2018 CLINICAL DATA:  History of head and neck squamous cell carcinoma. Status post right upper chest subcutaneous Port-A-Cath placement on 10/04/2018 with catheter  access via the right internal jugular vein. Development Pseudomonas bacteremia now requiring Port-A-Cath removal due to suspected catheter seeding/source for infection. EXAM: REMOVAL OF IMPLANTED TUNNELED PORT-A-CATH MEDICATIONS: None PROCEDURE: The right chest Port-A-Cath site was prepped with chlorhexidine. A sterile gown and gloves were worn during the procedure. Local anesthesia was provided with 1% lidocaine. An incision was made overlying the Port-A-Cath with a #15 scalpel. Utilizing sharp and blunt dissection, the Port-A-Cath was removed. The pocked was debrided with gauze soaked in sterile saline. Iodoform gauze was then packed into the wound. A dressing was then applied over the iodoform gauze. FINDINGS: After making an incision, the port pocket was inspected and demonstrates bloody fluid which is mildly turbulent and felt to be likely infected. Tissue was also indurated and therefore the pocket was left open to heal secondarily. After debridement, iodoform gauze was packed into the port wound. The entire Port-A-Cath and attached catheter were successfully removed. IMPRESSION: Removal of implanted Port-A-Cath utilizing sharp and blunt dissection. The port pocket demonstrates what appears to be likely infected hematoma. After debridement of bloody fluid and clot, the wound was packed with iodoform gauze. Electronically Signed   By: Irish Lack M.D.   On: 10/14/2018 14:34   Dg Chest Port 1 View  Result Date: 10/18/2018 CLINICAL DATA:  Shortness of breath. EXAM: PORTABLE CHEST 1 VIEW COMPARISON:  10/11/2018. FINDINGS: Interval removal of PowerPort catheter. Tracheostomy tube noted in stable position. Heart size normal. Right base infiltrate with right-sided pleural effusion. No pneumothorax. IMPRESSION: 1. Interval removal of PowerPort catheter. Tracheostomy tube noted with tip in stable position. 2.  Right base infiltrate with right-sided pleural effusion. Electronically Signed   By: Maisie Fus  Register    On: 10/18/2018 13:35   Dg Chest Port 1 View  Result Date: 10/11/2018 CLINICAL DATA:  Nausea and vomiting yesterday.  New tracheostomy. EXAM: PORTABLE CHEST 1 VIEW COMPARISON:  Chest CT 09/29/2018 FINDINGS: Tracheostomy tube appears in adequate position. Right IJ Port-A-Cath has tip over the SVC. Lungs are adequately inflated without focal airspace consolidation or effusion. No pneumothorax. Cardiomediastinal silhouette is within normal. Dense radiopaque material/calcifications over  the posteromedial right lower lobe as seen on CT scan. Remainder of the exam is unchanged. IMPRESSION: No acute cardiopulmonary disease. Tracheostomy tube and right IJ Port-A-Cath in adequate position. Electronically Signed   By: Elberta Fortis M.D.   On: 10/11/2018 15:40   Dg Abd Portable 1v  Result Date: 10/11/2018 CLINICAL DATA:  New tracheostomy tube. Nausea and vomiting yesterday. EXAM: PORTABLE ABDOMEN - 1 VIEW COMPARISON:  10/05/2018 FINDINGS: Percutaneous gastrostomy tube projects over the stomach in the left upper quadrant. Bowel gas pattern is nonobstructive. No free peritoneal air. Remainder of the exam is unchanged. IMPRESSION: Nonobstructive bowel gas pattern. Gastrostomy tube projects over the stomach in the left upper quadrant. Electronically Signed   By: Elberta Fortis M.D.   On: 10/11/2018 15:41   Dg Abd Portable 1v  Result Date: 10/03/2018 CLINICAL DATA:  Dysphagia. EXAM: PORTABLE ABDOMEN - 1 VIEW COMPARISON:  Radiograph of October 02, 2018. FINDINGS: The bowel gas pattern is normal. Residual contrast is noted throughout the colon. No radio-opaque calculi or other significant radiographic abnormality are seen. IMPRESSION: No evidence of bowel obstruction or ileus. Electronically Signed   By: Lupita Raider M.D.   On: 10/03/2018 07:52   Dg Abd Portable 1v  Result Date: 10/02/2018 CLINICAL DATA:  Nasogastric tube advancement. EXAM: PORTABLE ABDOMEN - 1 VIEW COMPARISON:  10/02/2018 at 1839 hours FINDINGS: The  nasogastric tube has been advanced. Tip now extends further into the stomach with the side hole entering the proximal stomach. Normal bowel gas pattern. IMPRESSION: 1. Advanced nasogastric tube, now fully within the stomach. Electronically Signed   By: Amie Portland M.D.   On: 10/02/2018 20:45   Ir Imaging Guided Port Insertion  Result Date: 10/04/2018 INDICATION: 74 year old male with a history squamous cell carcinoma and dysphagia EXAM: IMPLANTED PORT A CATH PLACEMENT WITH ULTRASOUND AND FLUOROSCOPIC GUIDANCE MEDICATIONS: 2 g Ancef; The antibiotic was administered within an appropriate time interval prior to skin puncture. ANESTHESIA/SEDATION: Moderate (conscious) sedation was employed during this procedure. A total of Versed 1.0 mg and Fentanyl 25 mcg was administered intravenously. Moderate Sedation Time: 19 minutes. The patient's level of consciousness and vital signs were monitored continuously by radiology nursing throughout the procedure under my direct supervision. FLUOROSCOPY TIME:  0 minutes, 6 seconds (1 mGy) COMPLICATIONS: None PROCEDURE: The procedure, risks, benefits, and alternatives were explained to the patient. Questions regarding the procedure were encouraged and answered. The patient understands and consents to the procedure. Ultrasound survey was performed with images stored and sent to PACs. The right neck and chest was prepped with chlorhexidine, and draped in the usual sterile fashion using maximum barrier technique (cap and mask, sterile gown, sterile gloves, large sterile sheet, hand hygiene and cutaneous antiseptic). Antibiotic prophylaxis was provided with 2.0g Ancef administered IV one hour prior to skin incision. Local anesthesia was attained by infiltration with 1% lidocaine without epinephrine. Ultrasound demonstrated patency of the right internal jugular vein, and this was documented with an image. Under real-time ultrasound guidance, this vein was accessed with a 21 gauge  micropuncture needle and image documentation was performed. A small dermatotomy was made at the access site with an 11 scalpel. A 0.018" wire was advanced into the SVC and used to estimate the length of the internal catheter. The access needle exchanged for a 25F micropuncture vascular sheath. The 0.018" wire was then removed and a 0.035" wire advanced into the IVC. An appropriate location for the subcutaneous reservoir was selected below the clavicle and an incision was  made through the skin and underlying soft tissues. The subcutaneous tissues were then dissected using a combination of blunt and sharp surgical technique and a pocket was formed. A single lumen power injectable portacatheter was then tunneled through the subcutaneous tissues from the pocket to the dermatotomy and the port reservoir placed within the subcutaneous pocket. The venous access site was then serially dilated and a peel away vascular sheath placed over the wire. The wire was removed and the port catheter advanced into position under fluoroscopic guidance. The catheter tip is positioned in the cavoatrial junction. This was documented with a spot image. The portacatheter was then tested and found to flush and aspirate well. The port was flushed with saline followed by 100 units/mL heparinized saline. The pocket was then closed in two layers using first subdermal inverted interrupted absorbable sutures followed by a running subcuticular suture. The epidermis was then sealed with Dermabond. The dermatotomy at the venous access site was also seal with Dermabond. Patient tolerated the procedure well and remained hemodynamically stable throughout. No complications encountered and no significant blood loss encountered IMPRESSION: Status post port catheter placement.  Catheter ready for use. Signed, Yvone Neu. Reyne Dumas, RPVI [facility] Electronically Signed   By: Gilmer Mor D.O.   On:  10/04/2018 14:21   US Thoracentesis Asp Pleural Space W/img Guide  Result Date: 10/19/2018 INDICATION: Shortness of breath. Right-sided pleural effusion. Request for diagnostic and therapeutic thoracentesis. EXAM: ULTRASOUND GUIDED RIGHT THORACENTESIS MEDICATIONS: None. COMPLICATIONS: None immediate. PROCEDURE: An ultrasound guided thoracentesis was thoroughly discussed with the patient and questions answered. The benefits, risks, alternatives and complications were also discussed. The patient understands and wishes to proceed with the procedure. Written consent was obtained. Ultrasound was performed to localize and mark an adequate pocket of fluid in the right chest. The area was then prepped and draped in the normal sterile fashion. 1% Lidocaine was used for local anesthesia. Under ultrasound guidance a 6 Fr Safe-T-Centesis catheter was introduced. Thoracentesis was performed. The catheter was removed and a dressing applied. FINDINGS: A total of approximately 450 mL of clear, pale yellow fluid was removed. Samples were sent to the laboratory as requested by the clinical team. IMPRESSION: Successful ultrasound guided right thoracentesis yielding 450 mL of pleural fluid. Read by: Brayton El PA-C Electronically Signed   By: Simonne Come M.D.   On: 10/19/2018 15:29    Lab Data:  CBC: Recent Labs  Lab 10/20/18 0515 10/21/18 0450 10/22/18 0417 10/23/18 0438 10/24/18 0457  WBC 12.1* 10.7* 11.5* 10.9* 9.7  NEUTROABS 8.7* 7.7  --   --   --   HGB 8.9* 8.7* 8.9* 9.0* 9.7*  HCT 28.7* 29.3* 30.1* 30.4* 31.2*  MCV 91.4 93.0 93.5 93.0 91.8  PLT 329 316 316 328 294   Basic Metabolic Panel: Recent Labs  Lab 10/20/18 0510  10/21/18 0450 10/22/18 0417 10/23/18 0438 10/24/18 0457 10/25/18 0438  NA  --    < > 141 138 138 138 138  K  --    < > 4.5 4.0 4.2 4.3 4.3  CL  --    < > 105 105 104 100 101  CO2  --    < > 28 29 27 29 30   GLUCOSE  --    < > 106* 104* 115* 104* 108*  BUN  --    < > 16 16 15  15 13   CREATININE  --    < > 0.69 0.63 0.66  0.74 0.66  CALCIUM  --    < > 8.2* 8.3* 8.3* 8.8* 9.1  MG 1.8  --  2.2  --   --   --  2.0  PHOS  --   --   --   --   --   --  4.4   < > = values in this interval not displayed.   GFR: Estimated Creatinine Clearance: 69.6 mL/min (by C-G formula based on SCr of 0.66 mg/dL). Liver Function Tests: Recent Labs  Lab 10/25/18 0438  AST 21  ALT 33  ALKPHOS 72  BILITOT 0.5  PROT 6.3*  ALBUMIN 2.5*   No results for input(s): LIPASE, AMYLASE in the last 168 hours. No results for input(s): AMMONIA in the last 168 hours. Coagulation Profile: No results for input(s): INR, PROTIME in the last 168 hours. Cardiac Enzymes: No results for input(s): CKTOTAL, CKMB, CKMBINDEX, TROPONINI in the last 168 hours. BNP (last 3 results) No results for input(s): PROBNP in the last 8760 hours. HbA1C: No results for input(s): HGBA1C in the last 72 hours. CBG: Recent Labs  Lab 10/25/18 1647 10/25/18 2029 10/26/18 0006 10/26/18 0411 10/26/18 0726  GLUCAP 102* 101* 108* 85 88   Lipid Profile: No results for input(s): CHOL, HDL, LDLCALC, TRIG, CHOLHDL, LDLDIRECT in the last 72 hours. Thyroid Function Tests: No results for input(s): TSH, T4TOTAL, FREET4, T3FREE, THYROIDAB in the last 72 hours. Anemia Panel: Recent Labs    10/25/18 0438  VITAMINB12 939*   Urine analysis:    Component Value Date/Time   COLORURINE YELLOW 03/30/2015 1330   APPEARANCEUR CLEAR 03/30/2015 1330   LABSPEC >1.030 (H) 03/30/2015 1330   PHURINE 5.5 03/30/2015 1330   GLUCOSEU NEGATIVE 03/30/2015 1330   HGBUR TRACE (A) 03/30/2015 1330   BILIRUBINUR NEGATIVE 03/30/2015 1330   KETONESUR NEGATIVE 03/30/2015 1330   PROTEINUR NEGATIVE 03/30/2015 1330   NITRITE NEGATIVE 03/30/2015 1330   LEUKOCYTESUR NEGATIVE 03/30/2015 1330     Conner Muegge M.D. [facility] 10/26/2018, 11:55 AM  Pager: [phone] Between 7am to 7pm - call Pager - [phone]  After 7pm go to  [email or web address] - password [email or web address]  Call night coverage person covering after 7pm

## 2018-10-27 LAB — CBC
HCT: 34.1 % — ABNORMAL LOW (ref 39.0–52.0)
Hemoglobin: 10.3 g/dL — ABNORMAL LOW (ref 13.0–17.0)
MCH: 27.5 pg (ref 26.0–34.0)
MCHC: 30.2 g/dL (ref 30.0–36.0)
MCV: 91.2 fL (ref 80.0–100.0)
Platelets: 343 10*3/uL (ref 150–400)
RBC: 3.74 MIL/uL — ABNORMAL LOW (ref 4.22–5.81)
RDW: 15 % (ref 11.5–15.5)
WBC: 7.8 10*3/uL (ref 4.0–10.5)
nRBC: 0 % (ref 0.0–0.2)

## 2018-10-27 LAB — GLUCOSE, CAPILLARY
Glucose-Capillary: 100 mg/dL — ABNORMAL HIGH (ref 70–99)
Glucose-Capillary: 114 mg/dL — ABNORMAL HIGH (ref 70–99)
Glucose-Capillary: 131 mg/dL — ABNORMAL HIGH (ref 70–99)
Glucose-Capillary: 97 mg/dL (ref 70–99)
Glucose-Capillary: 98 mg/dL (ref 70–99)
Glucose-Capillary: 98 mg/dL (ref 70–99)

## 2018-10-27 LAB — BASIC METABOLIC PANEL
Anion gap: 9 (ref 5–15)
BUN: 17 mg/dL (ref 8–23)
CO2: 27 mmol/L (ref 22–32)
Calcium: 8.8 mg/dL — ABNORMAL LOW (ref 8.9–10.3)
Chloride: 101 mmol/L (ref 98–111)
Creatinine, Ser: 0.66 mg/dL (ref 0.61–1.24)
GFR calc Af Amer: >60 mL/min (ref >60)
GFR calc non Af Amer: >60 mL/min (ref >60)
Glucose, Bld: 117 mg/dL — ABNORMAL HIGH (ref 70–99)
Potassium: 4.3 mmol/L (ref 3.5–5.1)
Sodium: 137 mmol/L (ref 135–145)

## 2018-10-27 NOTE — Progress Notes (Signed)
[facility]                                                                              Patient Demographics  Logan Price, is a 73 y.o. male, DOB - [birth year], ZOX:[id number]  Admit date - 09/27/2018   Admitting Physician Logan Bong, MD  Outpatient Primary MD for the patient is Logan Spencer, FNP  Outpatient specialists:   LOS - 30  days   Medical records reviewed and are as summarized below:    No chief complaint on file.      Brief summary  73 year old with history of GERD, essential hypertension, hyperlipidemia who recently diagnosed neck mass concerning for malignancy was admitted to the hospital. Overnight patient went into SVT therefore medical team was consulted. Patient was given adenosine x1 which converted back to normal sinus rhythm. G-tube placed by surgery 7/13, tolerating tube feeds. Metoprolol was started via PEG tube. Hospitals course was later on complicated by Pseudomonas bacteremia secondary to infected Port-A-Cath. Patient was transferred to medicine service. Infectious disease team were consulted who recommended removing Port-A-Cath.    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 long for radiation oncology evaluation, underwent simulation on 7/22, started XRT on 7/28.  Trach care per ENT. -Per patient, on 7 weeks radiation cycles, plan for skilled nursing facility when bed available, will finish IV antibiotics on 10/29/2018  Active problems Pseudomonas  bacteremia -Likely source Port-A-Cath, removed on 10/14/2018. -ID recommended IV cefepime for total 2 weeks post Port-A-Cath removal, end date on 10/29/2018 -Repeat blood cultures negative till date -Continue IV cefepime, total 2 weeks, will finish antibiotics on 10/29/2018.  Acute kidney injury secondary to acute urinary retention -Patient had over 1 L of urine in his bladder, creatinine 5.3 on 7/20, after placing Foley catheter, creatinine has been steadily improving -Creatinine stable 0.6, continue Foley catheter, had failed voiding trial and Foley catheter had to be placed back in -Patient will need urology outpatient follow-up for voiding trial and further evaluation.  Paroxysmal SVT, intermittent and recurrent -Patient had 25 beats run of nonsustained V. tach on 10/20/2018 a.m. -Patient was seen by cardiology, currently signed off.  2D echo normal, TSH within normal limits -Keep potassium ~4, magnesium above 2  Transudative pleural effusions, pneumonia, shortness of breath -Patient had complained of dyspnea on 7/24, intermittent, noticed to be 8.9 L positive during hospitalization.  Chest x-ray showed a right-sided pleural effusion with concerns for right basilar infiltrate -Ultrasound-guided thoracentesis done on 7/25 with 450 cc of clear fluid removed, transudative -Urine Legionella antigen negative, urine strep antigen negative -IV vancomycin, Zithromax has been discontinued.  Continue IV cefepime  Diarrhea -Likely secondary to tube feeds, C. difficile PCR negative -Improving  Code Status: Full CODE STATUS DVT Prophylaxis:  Heparin subcu Family Communication: Discussed in detail with the patient, all imaging results, lab results explained to the patient and patient's wife on 7/31, agreeable for SNF    Disposition Plan: Discussed in detail with the patient, will finish antibiotics on 8/4, undergoing XRT.  Agreeable for skilled nursing facility, social work consult placed.  Time  Spent in minutes 25 minutes  Procedures:   Percutaneous placement of gastrostomy tube 10/04/2018 per Dr. Ardelle Anton interventional radiology  Port-A-Cath removal per interventional radiology 10/14/2018  Ultrasound-guided thoracentesis 10/19/2018 per Brayton El, PA--- 450 cc of clear pale yellow fluid removed.  Consultants:    Oncology: Dr. Dion Body 09/30/2018  Interventional radiology: Dr. Ardelle Anton 10/02/2018  [facility]: Dr. Julian Reil 10/03/2018  General surgery: Dr. Luisa Hart 10/06/2018  Dental surgery: Dr. Valentino Hue 10/08/2018  Cardiology: Dr. Wyline Mood 10/13/2018  Infectious disease: Dr. Daiva Eves 10/13/2018  Radiation oncology    Antimicrobials:   Anti-infectives (From admission, onward)   Start     Dose/Rate Route Frequency Ordered Stop   10/19/18 0400  vancomycin (VANCOCIN) IVPB 750 mg/150 ml premix  Status:  Discontinued     750 mg 150 mL/hr over 60 Minutes Intravenous Every 12 hours 10/18/18 1541 10/20/18 1006   10/18/18 2000  ceFEPIme (MAXIPIME) 2 g in sodium chloride 0.9 % 100 mL IVPB     2 g 200 mL/hr over 30 Minutes Intravenous Every 8 hours 10/18/18 1243 10/30/18 0559   10/18/18 1600  azithromycin (ZITHROMAX) 500 mg in sodium chloride 0.9 % 250 mL IVPB     500 mg 250 mL/hr over 60 Minutes Intravenous Every 24 hours 10/18/18 1438 10/22/18 2252   10/18/18 1600  vancomycin (VANCOCIN) 1,250 mg in sodium chloride 0.9 % 250 mL IVPB     1,250 mg 166.7 mL/hr over 90 Minutes Intravenous  Once 10/18/18 1516 10/19/18 0100   10/15/18 1145  ceFEPIme (MAXIPIME) 2 g in sodium chloride 0.9 % 100 mL IVPB  Status:  Discontinued     2 g 200 mL/hr over 30 Minutes Intravenous Every 12 hours 10/15/18 1135 10/18/18 1243   10/14/18 1100  ceFEPIme (MAXIPIME) 2 g in sodium chloride 0.9 % 100 mL IVPB  Status:  Discontinued     2 g 200 mL/hr over 30 Minutes Intravenous Every 24 hours 10/13/18 1248 10/15/18 1135   10/14/18 0000  ceFAZolin (ANCEF) IVPB 2g/100 mL premix     2 g 200 mL/hr over 30  Minutes Intravenous To Radiology 10/13/18 1216 10/14/18 0907   10/12/18 0445  ceFEPIme (MAXIPIME) 2 g in sodium chloride 0.9 % 100 mL IVPB  Status:  Discontinued     2 g 200 mL/hr over 30 Minutes Intravenous Every 12 hours 10/12/18 0439 10/13/18 1248   10/11/18 1100  levofloxacin (LEVAQUIN) IVPB 750 mg  Status:  Discontinued     750 mg 100 mL/hr over 90 Minutes Intravenous Every 24 hours 10/11/18 0956 10/12/18 0439   10/04/18 1330  ceFAZolin (ANCEF) powder 1 g  Status:  Discontinued     1 g Other To Surgery 10/04/18 1317 10/04/18 1454   10/04/18 1315  ceFAZolin (ANCEF) 2-4 GM/100ML-% IVPB    Note to Pharmacy: Unk Lightning  : cabinet override      10/04/18 1315 10/04/18 1317   10/03/18 0800  ceFAZolin (ANCEF) IVPB 2g/100 mL premix     2 g 200 mL/hr over 30 Minutes Intravenous To Radiology 10/02/18 0901 10/04/18 1346   09/27/18 1500  clindamycin (CLEOCIN) IVPB 600 mg     600 mg 100  mL/hr over 30 Minutes Intravenous Every 8 hours 09/27/18 1400 09/28/18 0644         Medications  Scheduled Meds:  amLODipine  5 mg Per Tube Daily   chlorhexidine  15 mL Mouth Rinse BID   doxazosin  1 mg Per Tube Daily   feeding supplement (PRO-STAT SUGAR FREE 64)  30 mL Per Tube BID   free water  200 mL Per Tube Q4H   heparin injection (subcutaneous)  5,000 Units Subcutaneous Q8H   hydrocortisone   Rectal QID   lidocaine  1 application Urethral Once   mouth rinse  15 mL Mouth Rinse q12n4p   metoprolol tartrate  50 mg Per Tube QID   pantoprazole sodium  40 mg Per Tube Daily   scopolamine  1 patch Transdermal Q72H   simethicone  160 mg Per Tube QID   simvastatin  20 mg Per Tube q1800   Continuous Infusions:  sodium chloride 10 mL/hr at 10/27/18 1315   ceFEPime (MAXIPIME) IV 2 g (10/27/18 1315)   Molli Posey Peptide 1.5 60 mL/hr at 10/27/18 0641   lactated ringers 10 mL/hr at 10/07/18 0859   sodium chloride 500 mL/hr at 10/14/18 2049   PRN Meds:.sodium chloride, alum &  mag hydroxide-simeth, hydrALAZINE, hydrocortisone, hydrocortisone cream, ipratropium-albuterol, lip balm, loperamide HCl, loratadine, metoprolol tartrate, morphine injection, Muscle Rub, ondansetron (ZOFRAN) IV, phenol, polyethylene glycol, polyvinyl alcohol, promethazine, senna-docusate, sodium chloride      Subjective:   Zyking Quiggins was seen and examined today.  Patient has no acute complaints.  No fevers or chills, chest pain or any worsening shortness of breath.   Objective:   Vitals:   10/27/18 0354 10/27/18 0455 10/27/18 0748 10/27/18 1118  BP:  (!) 144/76    Pulse: 74 75 71 82  Resp: 16 18 18 18   Temp:  98.7 F (37.1 C)    TempSrc:  Oral    SpO2: 99% 99% 97% 95%  Weight:  58.5 kg    Height:        Intake/Output Summary (Last 24 hours) at 10/27/2018 1317 Last data filed at 10/27/2018 1005 Gross per 24 hour  Intake --  Output 2450 ml  Net -2450 ml     Wt Readings from Last 3 Encounters:  10/27/18 58.5 kg  09/11/18 63.2 kg  04/25/18 70.4 kg   Physical Exam  General: Alert and oriented x 3, NAD  Eyes:   HEENT: Trach +  Cardiovascular: S1 S2 clear, RRR. No pedal edema b/l  Respiratory: CTAB, no wheezing, rales or rhonchi  Gastrointestinal: Soft, nontender, nondistended, NBS  Ext: no pedal edema bilaterally  Neuro: no new deficits  Musculoskeletal: No cyanosis, clubbing  Skin: No rashes  Psych: Normal affect and demeanor, alert and oriented x3    Data Reviewed:  I have personally reviewed following labs and imaging studies  Micro Results Recent Results (from the past 240 hour(s))  SARS Coronavirus 2 (CEPHEID - Performed in [facility] hospital lab), Hosp Order     Status: None   Collection Time: 10/18/18  5:01 PM   Specimen: Nasopharyngeal Swab  Result Value Ref Range Status   SARS Coronavirus 2 NEGATIVE NEGATIVE Final    Comment: (NOTE) If result is NEGATIVE SARS-CoV-2 target nucleic acids are NOT DETECTED. The SARS-CoV-2 RNA is generally  detectable in upper and lower  respiratory specimens during the acute phase of infection. The lowest  concentration of SARS-CoV-2 viral copies this assay can detect is 250  copies / mL. A  negative result does not preclude SARS-CoV-2 infection  and should not be used as the sole basis for treatment or other  patient management decisions.  A negative result may occur with  improper specimen collection / handling, submission of specimen other  than nasopharyngeal swab, presence of viral mutation(s) within the  areas targeted by this assay, and inadequate number of viral copies  (<250 copies / mL). A negative result must be combined with clinical  observations, patient history, and epidemiological information. If result is POSITIVE SARS-CoV-2 target nucleic acids are DETECTED. The SARS-CoV-2 RNA is generally detectable in upper and lower  respiratory specimens dur ing the acute phase of infection.  Positive  results are indicative of active infection with SARS-CoV-2.  Clinical  correlation with patient history and other diagnostic information is  necessary to determine patient infection status.  Positive results do  not rule out bacterial infection or co-infection with other viruses. If result is PRESUMPTIVE POSTIVE SARS-CoV-2 nucleic acids MAY BE PRESENT.   A presumptive positive result was obtained on the submitted specimen  and confirmed on repeat testing.  While 2019 novel coronavirus  (SARS-CoV-2) nucleic acids may be present in the submitted sample  additional confirmatory testing may be necessary for epidemiological  and / or clinical management purposes  to differentiate between  SARS-CoV-2 and other Sarbecovirus currently known to infect humans.  If clinically indicated additional testing with an alternate test  methodology [phone]) is advised. The SARS-CoV-2 RNA is generally  detectable in upper and lower respiratory sp ecimens during the acute  phase of infection. The  expected result is Negative. Fact Sheet for Patients:  BoilerBrush.com.cy Fact Sheet for Healthcare Providers: https://pope.com/ This test is not yet approved or cleared by the [place] FDA and has been authorized for detection and/or diagnosis of SARS-CoV-2 by FDA under an Emergency Use Authorization (EUA).  This EUA will remain in effect (meaning this test can be used) for the duration of the COVID-19 declaration under Section 564(b)(1) of the Act, 21 U.S.C. section 360bbb-3(b)(1), unless the authorization is terminated or revoked sooner. Performed at [facility], [address] [place], [place] [address]   C difficile quick scan w PCR reflex     Status: None   Collection Time: 10/18/18  8:32 PM   Specimen: STOOL  Result Value Ref Range Status   C Diff antigen NEGATIVE NEGATIVE Final   C Diff toxin NEGATIVE NEGATIVE Final   C Diff interpretation No C. difficile detected.  Final    Comment: Performed at [facility], [address], [place], [place] [address]  Culture, body fluid-bottle     Status: None   Collection Time: 10/19/18  3:20 PM   Specimen: Pleura  Result Value Ref Range Status   Specimen Description PLEURAL  Final   Special Requests NONE  Final   Culture   Final    NO GROWTH 5 DAYS Performed at [facility], [address], [place], [place] [address]    Report Status 10/24/2018 FINAL  Final  Gram stain     Status: None   Collection Time: 10/19/18  3:20 PM   Specimen: Pleura  Result Value Ref Range Status   Specimen Description PLEURAL  Final   Special Requests NONE  Final   Gram Stain   Final    RARE WBC PRESENT, PREDOMINANTLY PMN NO ORGANISMS SEEN Performed at [facility], [address], [place], [place] [address]    Report  Status 10/20/2018 FINAL  Final    Radiology Reports Ct Abdomen Wo Contrast  Result Date: 10/01/2018 CLINICAL DATA:  73 year old  male undergoing evaluation for gastrostomy tube placement. EXAM: CT ABDOMEN WITHOUT CONTRAST TECHNIQUE: Multidetector CT imaging of the abdomen was performed following the standard protocol without IV contrast. COMPARISON:  Prior abdominal ultrasound 12/25/2016 FINDINGS: Lower chest: Mild lower lobe bronchial wall thickening. Trace atelectasis versus infiltrate in the dependent portion of the right lower lobe. No suspicious pulmonary nodule or mass. The visualized heart is normal in size. Unremarkable distal thoracic esophagus. Hepatobiliary: Normal hepatic contour and morphology. No discrete hepatic lesion. The gallbladder lumen is filled with high density material likely representing sludge and/or small stones. No biliary ductal dilatation. Pancreas: Unremarkable. No pancreatic ductal dilatation or surrounding inflammatory changes. Spleen: Normal in size without focal abnormality. Adrenals/Urinary Tract: Normal adrenal glands. Circumscribed low-attenuation lesions within the right kidney are incompletely characterized in the absence of intravenous contrast. However, a sonographically simple cyst was previously identified in the lower pole. No evidence of hydronephrosis or nephrolithiasis. Stomach/Bowel: High-riding transverse colon which lies anterior to the gastric body and antrum. Otherwise, normal gastric anatomy. No focal bowel wall thickening or evidence of obstruction. Vascular/Lymphatic: Limited evaluation in the absence of intravenous contrast. Extensive atherosclerotic vascular calcifications. No aneurysm. Other: No ascites or abdominal wall hernia. Musculoskeletal: No acute fracture or aggressive appearing lytic or blastic osseous lesion. IMPRESSION: 1. High-riding transverse colon which lies anterior to the gastric body and antrum. Percutaneous gastrostomy tube placement may be possible, however will require good visualization of the transverse colon at the time of attempted procedure. Consider  administration of barium prior to the procedure. 2.  Aortic Atherosclerosis (ICD10-170.0). 3. Right lower lobe atelectasis versus changes of small volume aspiration. 4. Sludge and/or small stones in the gallbladder lumen. Electronically Signed   By: Malachy Moan M.D.   On: 10/01/2018 14:18   Dg Orthopantogram  Result Date: 10/08/2018 CLINICAL DATA:  Poor dentition. Preoperative for laryngeal carcinoma. EXAM: ORTHOPANTOGRAM/PANORAMIC COMPARISON:  None. FINDINGS: Missing LEFT maxillary teeth from a first bicuspid through distal molar. Missing RIGHT maxillary teeth from second bicuspid through distal molar. Missing RIGHT mandibular teeth from first through distal molar. Missing LEFT mandibular first molar. No periapical lucency to suggest abscess. No definite caries. IMPRESSION: 1. Numerous missing mandibular and maxillary teeth, as detailed above. 2. No evidence of periapical dental abscess. Electronically Signed   By: Bary Richard M.D.   On: 10/08/2018 13:27   Dg Chest 1 View  Result Date: 10/19/2018 CLINICAL DATA:  S/P right thoracentesis. EXAM: CHEST  1 VIEW COMPARISON:  10/18/2018 FINDINGS: Tracheostomy tube is unchanged. The heart size is normal. Small RIGHT pleural effusion, decreased since prior study. No pneumothorax. Minimal bibasilar atelectasis. IMPRESSION: Decreased RIGHT pleural effusion. No pneumothorax. Electronically Signed   By: Norva Pavlov M.D.   On: 10/19/2018 15:43   Dg Abd 1 View  Result Date: 10/05/2018 CLINICAL DATA:  Is a gastric tube placement. EXAM: ABDOMEN - 1 VIEW COMPARISON:  October 04, 2018 FINDINGS: Nonobstructive bowel gas pattern. Residual contrast throughout the colon and in the appendix. Nonobstructive bowel gas pattern. Enteric catheter overlies the expected location of the gastric cardia with side hole at the expected location of the GE junction. IMPRESSION: Enteric catheter overlies the expected location of the gastric cardia with side hole at the expected  location of the GE junction. Electronically Signed   By: Ted Mcalpine M.D.   On: 10/05/2018 15:55   Dg Abd  1 View  Result Date: 10/02/2018 CLINICAL DATA:  Enteric tube placement. EXAM: ABDOMEN - 1 VIEW COMPARISON:  CT abdomen from yesterday. FINDINGS: Enteric tube tip in the stomach with the proximal side port in the lower esophagus. Nonobstructive bowel gas pattern. No acute osseous abnormality. IMPRESSION: 1. Enteric tube tip in the stomach with proximal side port in the lower esophagus. Recommend advancing 7-8 cm. Electronically Signed   By: Obie Dredge M.D.   On: 10/02/2018 18:59   Ct Soft Tissue Neck W Contrast  Result Date: 09/28/2018 CLINICAL DATA:  73 year old male status post tracheostomy and laryngeal biopsy postoperative day 1. Findings suspicious for squamous cell carcinoma. EXAM: CT NECK WITH CONTRAST TECHNIQUE: Multidetector CT imaging of the neck was performed using the standard protocol following the bolus administration of intravenous contrast. CONTRAST:  75mL OMNIPAQUE IOHEXOL 300 MG/ML  SOLN COMPARISON:  Neck CT 02/22/2018. FINDINGS: Pharynx and larynx: Tracheostomy has been placed, and there is a small volume of soft tissue gas tracking in the retropharyngeal space, in the bilateral neck, and into the visible mediastinum. The tube appears well positioned. There is debris in the cricoid above the tube. Trace retained secretions or debris in the trachea below the tube. There is an infiltrative and fungating heterogeneously enhancing mass now occupying the larynx, chiefly at and above the glottis, but also possibly infiltrating and involving the cervical esophagus below the glottis (series 3, image 80). There is extension through the right thyroid cartilage and contiguous appearing tumor - probably ex nodal disease - in the right lower neck lateral to the carotid space (series 3, image 64). There is associated narrowing of the right IJ at that level, which remains patent. All  told, the poorly marginated hyperenhancing tumor encompasses 35 x 76 x 66 millimeters (AP by transverse by CC). See series 3, image 63, 64, 74, and coronal images 51 through 54. Tumor extends to the AE folds and the mucosa hypopharynx is generally hyperenhancing. The oropharynx and nasopharynx are spared. Negative superior parapharyngeal spaces, the right lower paralaryngeal spaces are abnormal. The retropharyngeal space contains gas from the tracheostomy. Salivary glands: Negative sublingual space. The submandibular glands and parotid glands remain within normal limits. Thyroid: Postoperative changes from tracheostomy, but the posterior right thyroid lobe also appears abutted by tumor on series 3, image 84. Lymph nodes: Extensive right level 3 Aks nodal disease suspected, resulting in a semi contiguous appearance of tumor from the level 3 station into the larynx. Malignant right level 2A lymph nodes measuring 16 millimeter short axis on series 3, image 47. No level 1 node involvement. No abnormal level 4 nodes are evident. There are small but conspicuous left level 2 B (series 3, image 40) and level IIIb (image 61) lymph nodes which are larger than in 2019, up to 7 millimeters diameter. Vascular: Mass effect on the right internal jugular vein from tumor in the right lower neck, but the vein remains patent. Tumor surrounds the right lower carotid space but the right carotid remains patent with calcified atherosclerosis. Other major vascular structures in the neck and at the skull base are patent. Limited intracranial: Negative. Visualized orbits: Negative. Mastoids and visualized paranasal sinuses: Clear. Skeleton: Hyperenhancing tumor appears to occupy the prevertebral space in the lower cervical spine from C4-C5 through C7-T1, and probably is associated with cervical esophageal involvement. See series 3, image 80 and sagittal image 52. No bone erosion identified. No lytic or suspicious osseous lesion identified  elsewhere. Upper chest: Centrilobular emphysema. Stable small right  apical nodules on series 9, image 88, and left apically nodule on image 94, which are likely related to lung scarring. Small volume pneumomediastinum following tracheostomy tube placement. Distal to the tube the trachea appears patent with some retained secretions. No superior mediastinal lymphadenopathy. Calcified aortic atherosclerosis. IMPRESSION: 1. Infiltrative and poorly marginated enhancing tumor in the lower neck eccentric to the right appears to involve the larynx, the adjacent cervical esophagus, and is somewhat contiguous with ex-nodal disease in the right level 3 station, surrounding the right carotid space and narrowing the right IJ which remains patent. - all told tumor encompasses 35 x 76 x 66 mm (AP by transverse by CC) - prevertebral space involvement suspected C4-C5 through C7-T1 but no underlying bony changes. - malignant right level 2 nodal disease, and small but suspicious contralateral left level 2 and left level 3 nodes. 2. Status post tracheostomy with small volume pneumomediastinum and soft tissue gas in the neck. The airway appears patent below the tracheostomy tube and no adverse features are identified. Electronically Signed   By: Odessa Fleming M.D.   On: 09/28/2018 17:34   Ct Chest W Contrast  Result Date: 09/29/2018 CLINICAL DATA:  Neck mass, advanced neck cancer, status post tracheostomy. EXAM: CT CHEST WITH CONTRAST TECHNIQUE: Multidetector CT imaging of the chest was performed during intravenous contrast administration. CONTRAST:  75mL OMNIPAQUE IOHEXOL 300 MG/ML  SOLN COMPARISON:  09/28/2018 neck CT. FINDINGS: Cardiovascular: Normal heart size. No significant pericardial effusion/thickening. Three-vessel coronary atherosclerosis. Atherosclerotic nonaneurysmal thoracic aorta. Normal caliber pulmonary arteries. No central pulmonary emboli. Mediastinum/Nodes: No discrete thyroid nodules. Normal thoracic esophagus.  Infiltrative right neck mass involving larynx and cervical esophagus, as detailed on neck CT from 1 day prior. Subcutaneous emphysema scattered in the lower neck bilaterally. Scattered pneumomediastinum throughout the bilateral paratracheal and paraesophageal regions. No pathologically enlarged axillary, mediastinal or hilar lymph nodes. Lungs/Pleura: No pneumothorax. No pleural effusion. Moderate centrilobular emphysema with diffuse bronchial wall thickening. Tracheostomy tube tip is in the tracheal lumen just below thoracic inlet. No acute consolidative airspace disease or lung masses. Scattered calcified granulomas throughout the medial right lower lobe. A few scattered solid pulmonary nodules in the upper lobes bilaterally, predominantly at the lung apices, largest 6 mm at the apical left upper lobe (series 5/image 17) and 5 mm in the apical right upper lobe (series 5/image 18), all stable since 02/22/2018 CT. Upper abdomen: No acute abnormality. Musculoskeletal: No aggressive appearing focal osseous lesions. Mild symmetric bilateral gynecomastia. Moderate thoracic spondylosis. IMPRESSION: 1. No thoracic adenopathy or other findings suspicious for metastatic disease in the chest. 2. Bilateral upper lobe solid pulmonary nodules, largest 6 mm, all stable since 02/22/2018 neck CT, more likely benign. Suggest follow-up chest CT in 12 months to demonstrate continued stability. 3. Infiltrative right neck mass involving the larynx and cervical esophagus, as detailed on neck CT from 1 day prior. 4. Well-positioned tracheostomy tube. 5. Three-vessel coronary atherosclerosis. Aortic Atherosclerosis (ICD10-I70.0) and Emphysema (ICD10-J43.9). Electronically Signed   By: Delbert Phenix M.D.   On: 09/29/2018 13:41   Ir Gastrostomy Tube Mod Sed  Result Date: 10/04/2018 INDICATION: 73 year old male with dysphagia EXAM: PERC PLACEMENT GASTROSTOMY MEDICATIONS: 2 g Ancef ANESTHESIA/SEDATION: Versed 0.5 mg IV; Fentanyl 25 mcg IV  Moderate Sedation Time:  0 The patient was continuously monitored during the procedure by the interventional radiology nurse under my direct supervision. CONTRAST:  None FLUOROSCOPY TIME:  Fluoroscopy Time: 0 minutes 30 seconds (4 mGy). COMPLICATIONS: None immediate. PROCEDURE: Informed written consent was obtained from  the patient's family after a thorough discussion of the procedural risks, benefits and alternatives. All questions were addressed. Maximal Sterile Barrier Technique was utilized including caps, mask, sterile gowns, sterile gloves, sterile drape, hand hygiene and skin antiseptic. A timeout was performed prior to the initiation of the procedure. The epigastrium was prepped with Betadine in a sterile fashion, and a sterile drape was applied covering the operative field. A sterile gown and sterile gloves were used for the procedure. A 5-French orogastric tube is placed under fluoroscopic guidance. Scout imaging of the abdomen confirms barium within the transverse colon. The stomach was distended with gas. Multiple x-ray performed with insufflation of the stomach. The partially barium filled colon did not migrate caudally with insufflation of the stomach. X-rays were performed. We withdrew from the procedure given the anatomy. Patient tolerated the procedure well and remained hemodynamically stable throughout. No complications were encountered and no significant blood loss encountered. IMPRESSION: Attempt at percutaneous gastrostomy demonstrates insufficient anatomy for image guided placement. The transverse colon did not migrate caudally with insufflation of the stomach. Signed, Yvone Neu. Loreta Ave, DO [facility] Electronically Signed   By: Gilmer Mor D.O.   On: 10/04/2018 14:25   Ir Removal [employer] W/ [place] W/o Fl Mod Sed  Result Date: 10/14/2018 CLINICAL DATA:  History of head and neck squamous cell carcinoma. Status post right upper  chest subcutaneous Port-A-Cath placement on 10/04/2018 with catheter access via the right internal jugular vein. Development Pseudomonas bacteremia now requiring Port-A-Cath removal due to suspected catheter seeding/source for infection. EXAM: REMOVAL OF IMPLANTED TUNNELED PORT-A-CATH MEDICATIONS: None PROCEDURE: The right chest Port-A-Cath site was prepped with chlorhexidine. A sterile gown and gloves were worn during the procedure. Local anesthesia was provided with 1% lidocaine. An incision was made overlying the Port-A-Cath with a #15 scalpel. Utilizing sharp and blunt dissection, the Port-A-Cath was removed. The pocked was debrided with gauze soaked in sterile saline. Iodoform gauze was then packed into the wound. A dressing was then applied over the iodoform gauze. FINDINGS: After making an incision, the port pocket was inspected and demonstrates bloody fluid which is mildly turbulent and felt to be likely infected. Tissue was also indurated and therefore the pocket was left open to heal secondarily. After debridement, iodoform gauze was packed into the port wound. The entire Port-A-Cath and attached catheter were successfully removed. IMPRESSION: Removal of implanted Port-A-Cath utilizing sharp and blunt dissection. The port pocket demonstrates what appears to be likely infected hematoma. After debridement of bloody fluid and clot, the wound was packed with iodoform gauze. Electronically Signed   By: Irish Lack M.D.   On: 10/14/2018 14:34   Dg Chest Port 1 View  Result Date: 10/18/2018 CLINICAL DATA:  Shortness of breath. EXAM: PORTABLE CHEST 1 VIEW COMPARISON:  10/11/2018. FINDINGS: Interval removal of PowerPort catheter. Tracheostomy tube noted in stable position. Heart size normal. Right base infiltrate with right-sided pleural effusion. No pneumothorax. IMPRESSION: 1. Interval removal of PowerPort catheter. Tracheostomy tube noted with tip in stable position. 2.  Right base infiltrate with  right-sided pleural effusion. Electronically Signed   By: Maisie Fus  Register   On: 10/18/2018 13:35   Dg Chest Port 1 View  Result Date: 10/11/2018 CLINICAL DATA:  Nausea and vomiting yesterday.  New tracheostomy. EXAM: PORTABLE CHEST 1 VIEW COMPARISON:  Chest CT 09/29/2018 FINDINGS: Tracheostomy tube appears in adequate position. Right IJ Port-A-Cath has tip over the SVC. Lungs are adequately inflated without focal airspace consolidation or  effusion. No pneumothorax. Cardiomediastinal silhouette is within normal. Dense radiopaque material/calcifications over the posteromedial right lower lobe as seen on CT scan. Remainder of the exam is unchanged. IMPRESSION: No acute cardiopulmonary disease. Tracheostomy tube and right IJ Port-A-Cath in adequate position. Electronically Signed   By: Elberta Fortis M.D.   On: 10/11/2018 15:40   Dg Abd Portable 1v  Result Date: 10/11/2018 CLINICAL DATA:  New tracheostomy tube. Nausea and vomiting yesterday. EXAM: PORTABLE ABDOMEN - 1 VIEW COMPARISON:  10/05/2018 FINDINGS: Percutaneous gastrostomy tube projects over the stomach in the left upper quadrant. Bowel gas pattern is nonobstructive. No free peritoneal air. Remainder of the exam is unchanged. IMPRESSION: Nonobstructive bowel gas pattern. Gastrostomy tube projects over the stomach in the left upper quadrant. Electronically Signed   By: Elberta Fortis M.D.   On: 10/11/2018 15:41   Dg Abd Portable 1v  Result Date: 10/03/2018 CLINICAL DATA:  Dysphagia. EXAM: PORTABLE ABDOMEN - 1 VIEW COMPARISON:  Radiograph of October 02, 2018. FINDINGS: The bowel gas pattern is normal. Residual contrast is noted throughout the colon. No radio-opaque calculi or other significant radiographic abnormality are seen. IMPRESSION: No evidence of bowel obstruction or ileus. Electronically Signed   By: Lupita Raider M.D.   On: 10/03/2018 07:52   Dg Abd Portable 1v  Result Date: 10/02/2018 CLINICAL DATA:  Nasogastric tube advancement. EXAM:  PORTABLE ABDOMEN - 1 VIEW COMPARISON:  10/02/2018 at 1839 hours FINDINGS: The nasogastric tube has been advanced. Tip now extends further into the stomach with the side hole entering the proximal stomach. Normal bowel gas pattern. IMPRESSION: 1. Advanced nasogastric tube, now fully within the stomach. Electronically Signed   By: Amie Portland M.D.   On: 10/02/2018 20:45   Ir Imaging Guided Port Insertion  Result Date: 10/04/2018 INDICATION: 73 year old male with a history squamous cell carcinoma and dysphagia EXAM: IMPLANTED PORT A CATH PLACEMENT WITH ULTRASOUND AND FLUOROSCOPIC GUIDANCE MEDICATIONS: 2 g Ancef; The antibiotic was administered within an appropriate time interval prior to skin puncture. ANESTHESIA/SEDATION: Moderate (conscious) sedation was employed during this procedure. A total of Versed 1.0 mg and Fentanyl 25 mcg was administered intravenously. Moderate Sedation Time: 19 minutes. The patient's level of consciousness and vital signs were monitored continuously by radiology nursing throughout the procedure under my direct supervision. FLUOROSCOPY TIME:  0 minutes, 6 seconds (1 mGy) COMPLICATIONS: None PROCEDURE: The procedure, risks, benefits, and alternatives were explained to the patient. Questions regarding the procedure were encouraged and answered. The patient understands and consents to the procedure. Ultrasound survey was performed with images stored and sent to PACs. The right neck and chest was prepped with chlorhexidine, and draped in the usual sterile fashion using maximum barrier technique (cap and mask, sterile gown, sterile gloves, large sterile sheet, hand hygiene and cutaneous antiseptic). Antibiotic prophylaxis was provided with 2.0g Ancef administered IV one hour prior to skin incision. Local anesthesia was attained by infiltration with 1% lidocaine without epinephrine. Ultrasound demonstrated patency of the right internal jugular vein, and this was documented with an image.  Under real-time ultrasound guidance, this vein was accessed with a 21 gauge micropuncture needle and image documentation was performed. A small dermatotomy was made at the access site with an 11 scalpel. A 0.018" wire was advanced into the SVC and used to estimate the length of the internal catheter. The access needle exchanged for a 50F micropuncture vascular sheath. The 0.018" wire was then removed and a 0.035" wire advanced into the IVC. An appropriate location for  the subcutaneous reservoir was selected below the clavicle and an incision was made through the skin and underlying soft tissues. The subcutaneous tissues were then dissected using a combination of blunt and sharp surgical technique and a pocket was formed. A single lumen power injectable portacatheter was then tunneled through the subcutaneous tissues from the pocket to the dermatotomy and the port reservoir placed within the subcutaneous pocket. The venous access site was then serially dilated and a peel away vascular sheath placed over the wire. The wire was removed and the port catheter advanced into position under fluoroscopic guidance. The catheter tip is positioned in the cavoatrial junction. This was documented with a spot image. The portacatheter was then tested and found to flush and aspirate well. The port was flushed with saline followed by 100 units/mL heparinized saline. The pocket was then closed in two layers using first subdermal inverted interrupted absorbable sutures followed by a running subcuticular suture. The epidermis was then sealed with Dermabond. The dermatotomy at the venous access site was also seal with Dermabond. Patient tolerated the procedure well and remained hemodynamically stable throughout. No complications encountered and no significant blood loss encountered IMPRESSION: Status post port catheter placement.  Catheter ready for use. Signed, Yvone Neu. Reyne Dumas, RPVI [facility]  [facility] Electronically Signed   By: Gilmer Mor D.O.   On: 10/04/2018 14:21   US Thoracentesis Asp Pleural Space W/img Guide  Result Date: 10/19/2018 INDICATION: Shortness of breath. Right-sided pleural effusion. Request for diagnostic and therapeutic thoracentesis. EXAM: ULTRASOUND GUIDED RIGHT THORACENTESIS MEDICATIONS: None. COMPLICATIONS: None immediate. PROCEDURE: An ultrasound guided thoracentesis was thoroughly discussed with the patient and questions answered. The benefits, risks, alternatives and complications were also discussed. The patient understands and wishes to proceed with the procedure. Written consent was obtained. Ultrasound was performed to localize and mark an adequate pocket of fluid in the right chest. The area was then prepped and draped in the normal sterile fashion. 1% Lidocaine was used for local anesthesia. Under ultrasound guidance a 6 Fr Safe-T-Centesis catheter was introduced. Thoracentesis was performed. The catheter was removed and a dressing applied. FINDINGS: A total of approximately 450 mL of clear, pale yellow fluid was removed. Samples were sent to the laboratory as requested by the clinical team. IMPRESSION: Successful ultrasound guided right thoracentesis yielding 450 mL of pleural fluid. Read by: Brayton El PA-C Electronically Signed   By: Simonne Come M.D.   On: 10/19/2018 15:29    Lab Data:  CBC: Recent Labs  Lab 10/21/18 0450 10/22/18 0417 10/23/18 0438 10/24/18 0457 10/27/18 0538  WBC 10.7* 11.5* 10.9* 9.7 7.8  NEUTROABS 7.7  --   --   --   --   HGB 8.7* 8.9* 9.0* 9.7* 10.3*  HCT 29.3* 30.1* 30.4* 31.2* 34.1*  MCV 93.0 93.5 93.0 91.8 91.2  PLT 316 316 328 294 343   Basic Metabolic Panel: Recent Labs  Lab 10/21/18 0450 10/22/18 0417 10/23/18 0438 10/24/18 0457 10/25/18 0438 10/27/18 0538  NA 141 138 138 138 138 137  K 4.5 4.0 4.2 4.3 4.3 4.3  CL 105 105 104 100 101 101  CO2 28 29 27 29 30 27   GLUCOSE 106* 104* 115*  104* 108* 117*  BUN 16 16 15 15 13 17   CREATININE 0.69 0.63 0.66 0.74 0.66 0.66  CALCIUM 8.2* 8.3* 8.3* 8.8* 9.1 8.8*  MG 2.2  --   --   --  2.0  --   PHOS  --   --   --   --  4.4  --    GFR: Estimated Creatinine Clearance: 70.1 mL/min (by C-G formula based on SCr of 0.66 mg/dL). Liver Function Tests: Recent Labs  Lab 10/25/18 0438  AST 21  ALT 33  ALKPHOS 72  BILITOT 0.5  PROT 6.3*  ALBUMIN 2.5*   No results for input(s): LIPASE, AMYLASE in the last 168 hours. No results for input(s): AMMONIA in the last 168 hours. Coagulation Profile: No results for input(s): INR, PROTIME in the last 168 hours. Cardiac Enzymes: No results for input(s): CKTOTAL, CKMB, CKMBINDEX, TROPONINI in the last 168 hours. BNP (last 3 results) No results for input(s): PROBNP in the last 8760 hours. HbA1C: No results for input(s): HGBA1C in the last 72 hours. CBG: Recent Labs  Lab 10/26/18 1158 10/26/18 2250 10/27/18 0004 10/27/18 0452 10/27/18 0740  GLUCAP 113* 128* 100* 98 131*   Lipid Profile: No results for input(s): CHOL, HDL, LDLCALC, TRIG, CHOLHDL, LDLDIRECT in the last 72 hours. Thyroid Function Tests: No results for input(s): TSH, T4TOTAL, FREET4, T3FREE, THYROIDAB in the last 72 hours. Anemia Panel: Recent Labs    10/25/18 0438  VITAMINB12 939*   Urine analysis:    Component Value Date/Time   COLORURINE YELLOW 03/30/2015 1330   APPEARANCEUR CLEAR 03/30/2015 1330   LABSPEC >1.030 (H) 03/30/2015 1330   PHURINE 5.5 03/30/2015 1330   GLUCOSEU NEGATIVE 03/30/2015 1330   HGBUR TRACE (A) 03/30/2015 1330   BILIRUBINUR NEGATIVE 03/30/2015 1330   KETONESUR NEGATIVE 03/30/2015 1330   PROTEINUR NEGATIVE 03/30/2015 1330   NITRITE NEGATIVE 03/30/2015 1330   LEUKOCYTESUR NEGATIVE 03/30/2015 1330     Edwyn Inclan M.D. [facility] 10/27/2018, 1:17 PM  Pager: [phone] Between 7am to 7pm - call Pager - [phone]  After 7pm go to [email or web address] - password [email or web address]  Call night  coverage person covering after 7pm

## 2018-10-28 ENCOUNTER — Inpatient Hospital Stay: Payer: Medicare Other | Admitting: Nutrition

## 2018-10-28 ENCOUNTER — Ambulatory Visit
Admission: RE | Admit: 2018-10-28 | Discharge: 2018-10-28 | Disposition: A | Discharge status: Home / Self Care | Payer: Medicare Other | Source: Ambulatory Visit | Attending: Radiation Oncology | Admitting: Radiation Oncology

## 2018-10-28 DIAGNOSIS — Z510 Encounter for antineoplastic radiation therapy: Secondary | ICD-10-CM | POA: Insufficient documentation

## 2018-10-28 DIAGNOSIS — Z4659 Encounter for fitting and adjustment of other gastrointestinal appliance and device: Secondary | ICD-10-CM

## 2018-10-28 DIAGNOSIS — C320 Malignant neoplasm of glottis: Secondary | ICD-10-CM | POA: Insufficient documentation

## 2018-10-28 LAB — BASIC METABOLIC PANEL
Anion gap: 10 (ref 5–15)
BUN: 19 mg/dL (ref 8–23)
CO2: 25 mmol/L (ref 22–32)
Calcium: 8.6 mg/dL — ABNORMAL LOW (ref 8.9–10.3)
Chloride: 102 mmol/L (ref 98–111)
Creatinine, Ser: 0.73 mg/dL (ref 0.61–1.24)
GFR calc Af Amer: >60 mL/min (ref >60)
GFR calc non Af Amer: >60 mL/min (ref >60)
Glucose, Bld: 117 mg/dL — ABNORMAL HIGH (ref 70–99)
Potassium: 4.2 mmol/L (ref 3.5–5.1)
Sodium: 137 mmol/L (ref 135–145)

## 2018-10-28 LAB — GLUCOSE, CAPILLARY
Glucose-Capillary: 112 mg/dL — ABNORMAL HIGH (ref 70–99)
Glucose-Capillary: 107 mg/dL — ABNORMAL HIGH (ref 70–99)
Glucose-Capillary: 105 mg/dL — ABNORMAL HIGH (ref 70–99)
Glucose-Capillary: 100 mg/dL — ABNORMAL HIGH (ref 70–99)
Glucose-Capillary: 92 mg/dL (ref 70–99)

## 2018-10-28 MED ORDER — METOPROLOL TARTRATE 25 MG/10 ML ORAL SUSPENSION
50.0000 mg | Freq: Two times a day (BID) | ORAL | Status: DC
  Administered 2018-10-28 – 2018-10-31 (×4): 50 mg
  Filled 2018-10-28 (×7): qty 20

## 2018-10-28 NOTE — Progress Notes (Signed)
Ord Radiation Oncology Dept Therapy Treatment Record Phone [phone]   Radiation Therapy was administered to Logan Price on: 10/28/2018  3:15 PM and was treatment # 5 out of a planned course of 35 treatments.  Radiation Treatment  1). Beam photons with 6-10 energy  2). Brachytherapy None  3). Stereotactic Radiosurgery None  4). Other Radiation None     Logan Price Logan Price, RT (T)

## 2018-10-28 NOTE — TOC Progression Note (Addendum)
Transition of Care [facility]) - Progression Note    Patient Details  Name: Logan Price MRN: [id number] Date of Birth: 07/10/1945  Transition [facility] CM/SW Contact  Joaquin Courts, RN Phone Number: 10/28/2018, 2:22 PM  Clinical Narrative:    CM received call from [facility] rep [place] stating facility will not be able to take patient at are removing their bed offer. CM notified MD and placed call to brother gregory to update him on the snf status    Expected Discharge Plan: [place] Barriers to Discharge: Continued Medical Work up  Expected Discharge Plan and Services Expected Discharge Plan: [place]   Discharge Planning Services: CM Consult Post Acute Care Choice: Home Health   Expected Discharge Date: 10/10/18               DME Arranged: Tube feeding, Tube feeding pump, Trach supplies DME Agency: [employer] Date DME Agency Contacted: 10/10/18 Time DME Agency Contacted: 1000 Representative spoke with at DME Agency: Thedore Mins HH Arranged: RN, PT, OT, Nurse's Aide, Social Work [employer] Agency: [place] ([place]) Date [place]: 10/03/18 Time [place]: 1152 Representative spoke with at [place]: [place] ([place]) Interventions    Readmission Risk Interventions Readmission Risk Prevention Plan 10/28/2018  Transportation Screening Complete  PCP or Specialist Appt within 3-5 Days Complete  HRI or [place] Not Complete  HRI or Home Care Consult comments dc to [facility]  Social Work Consult for [place] Planning/Counseling Complete  Palliative Care Screening Not Applicable  Medication Review Press [employer]) Complete  Some recent data might be hidden

## 2018-10-28 NOTE — Progress Notes (Signed)
Speech Language Pathology Treatment: Dysphagia;Passy Muir Speaking valve  Patient Details Name: Logan Price MRN: [id number] DOB: [birth year] Today's Date: 10/28/2018 Time: 4259-5638 SLP Time Calculation (min) (ACUTE ONLY): 26 min  Assessment / Plan / Recommendation Clinical Impression  Today pt fully alert and his wife is present.  Note pt with copious secretions today - bubbling from under his trach today.  SLP encouraged him to continue to cough and expectorate via trach or oral *with finger occlusion*.  Pt able to tolerate PMSV well with no breath stacking or complaints of dyspnea.  He coughs and expectorates secretions orally with valve in place.  Pt reports he had worn his valve for 20 minutes prior to SLP arrival but stated it "popped off"- suspect this is due to copious secretions.  Demonstrated to wife and had her return demonstrate x5 = placement and removal of valve.    Today pt able to demonstrate 2/3 swallowing exercises without cues!  Mild massage continues to be encouraged to loosen tissue prior to conducting exercises.  SLP recommends MBS prior to pt dc hospital to establish baseline of his swallowing and determine if he can consume ANY po intake.  He has been resistant to consume po or have MBS but with his medical stability and XRT hopefully causing tumor to decrease in size, hopeful for at least some po and to establish exercises necessary to help his rehab.  Pt and wife are agreeable to plan.    Made xray, wife and pt's RN aware of plan for MBS 10/29/2018- hopefully am.    .    HPI HPI: Pt is a 73 yo male admitted for planned trach and laryngeal biopsy 7/2. Biopsy results pending but Neck CT shows extensive laryngeal, pharyngeal, esophageal mass- diagnosed as cancer.  Pt is s/p trach and PEG tube.  He is undergoing radiation tx started 10/22/2018.   Esophagram 6/26 showed silent gross aspiration of barium. PMH includes: HLD, HTN, hepatitis C, GERD, diverticulitis.  Pt has been followed  by SLP for dysphagia and pmsv.  He has previously declined to consume po intake but has been using his pmsv and conducting two swallowing exercises (lingual press and effortful swallow).      SLP Plan  MBS(tomorrow 10/29/2018 in am)       Recommendations  Diet recommendations: NPO Medication Administration: Via alternative means      Patient may use Passy-Muir Speech Valve: During all waking hours (remove during sleep) PMSV Supervision: Intermittent         Oral Care Recommendations: Oral care BID Follow up Recommendations: Skilled Nursing facility SLP Visit Diagnosis: Aphonia (R49.1);Dysphagia, unspecified (R13.10) Plan: MBS(tomorrow 10/29/2018 in am)       GO                Chales Abrahams 10/28/2018, 2:41 PM  Donavan Burnet, MS [facility] SLP Acute Rehab Services Pager [phone] Office [phone]

## 2018-10-28 NOTE — Progress Notes (Signed)
PT Cancellation Note  Patient Details Name: Logan Price MRN: [id number] DOB: September 06, 1945   Cancelled Treatment:    Reason Eval/Treat Not Completed: Patient at procedure or test/unavailable Radiation   Tamora Huneke,KATHrine E 10/28/2018, 3:05 PM Carmelia Bake, PT, DPT Acute Rehabilitation Services Office: [phone] Pager: [phone]

## 2018-10-28 NOTE — Progress Notes (Signed)
[facility]                                                                              Patient Demographics  Logan Price, is a 73 y.o. male, DOB - 1945-04-26, GUY:[id number]  Admit date - 09/27/2018   Admitting Physician [name], MD  Outpatient Primary MD for the patient is [name], FNP  Outpatient specialists:   LOS - 31  days   Medical records reviewed and are as summarized below:    No chief complaint on file.      Brief summary  73 year old with history of GERD, essential hypertension, hyperlipidemia who recently diagnosed neck mass concerning for malignancy was admitted to the hospital. Overnight patient went into SVT therefore medical team was consulted. Patient was given adenosine x1 which converted back to normal sinus rhythm. G-tube placed by surgery 7/13, tolerating tube feeds. Metoprolol was started via PEG tube. Hospitals course was later on complicated by Pseudomonas bacteremia secondary to infected Port-A-Cath. Patient was transferred to medicine service. Infectious disease team were consulted who recommended removing Port-A-Cath.    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  Trach care per ENT. -Per patient, on 7 weeks radiation cycles, will finish antibiotics on 10/29/2018 tomorrow  -Plan to DC to SNF once repeat COVID test available  Active problems Pseudomonas  bacteremia -Likely source Port-A-Cath, removed on 10/14/2018. -ID recommended IV cefepime for total 2 weeks post Port-A-Cath removal, end date on 10/29/2018 -Repeat blood cultures negative till date -Continue IV cefepime, will finish the course tomorrow  Acute kidney injury secondary to acute urinary retention -Patient had over 1 L of urine in his bladder, creatinine 5.3 on 7/20, after placing Foley catheter, creatinine has been steadily improving -Creatinine stable 0.6, continue Foley catheter, had failed voiding trial and Foley catheter had to be placed back in -Patient will need urology outpatient follow-up for voiding trial and further evaluation.  Paroxysmal SVT, intermittent and recurrent -Patient had 25 beats run of nonsustained V. tach on 10/20/2018 a.m. -Patient was seen by cardiology, currently signed off.  2D echo normal, TSH within normal limits -Keep potassium ~4, magnesium above 2  Transudative pleural effusions, pneumonia, shortness of breath -Patient had complained of dyspnea on 7/24, intermittent, noticed to be 8.9 L positive during hospitalization.  Chest x-ray showed a right-sided pleural effusion with concerns for right basilar infiltrate -Ultrasound-guided thoracentesis done on 7/25 with 450 cc of clear fluid removed, transudative -Urine Legionella antigen negative, urine strep antigen negative -IV vancomycin, Zithromax has been discontinued -Continue IV cefepime  Diarrhea -Likely secondary to tube feeds, C. difficile PCR negative -Improving  Hypertension -SBP has been running in 90s hold  off on amlodipine, decrease metoprolol to 50 mg twice a day.  Code Status: Full CODE STATUS DVT Prophylaxis: Heparin subcu Family Communication: Discussed in detail with the patient, all imaging results, lab results explained to the patient    Disposition Plan: Discussed in detail with the patient, will finish antibiotics on 8/4, undergoing XRT.   Time Spent in minutes 25  minutes  Procedures:   Percutaneous placement of gastrostomy tube 10/04/2018 per Dr. Ardelle Anton interventional radiology  Port-A-Cath removal per interventional radiology 10/14/2018  Ultrasound-guided thoracentesis 10/19/2018 per Brayton El, PA--- 450 cc of clear pale yellow fluid removed.  Consultants:    Oncology: Dr. Dion Body 09/30/2018  Interventional radiology: Dr. Ardelle Anton 10/02/2018  [facility]: Dr. Julian Reil 10/03/2018  General surgery: Dr. Luisa Hart 10/06/2018  Dental surgery: Dr. Valentino Hue 10/08/2018  Cardiology: Dr. Wyline Mood 10/13/2018  Infectious disease: Dr. Daiva Eves 10/13/2018  Radiation oncology    Antimicrobials:   Anti-infectives (From admission, onward)   Start     Dose/Rate Route Frequency Ordered Stop   10/19/18 0400  vancomycin (VANCOCIN) IVPB 750 mg/150 ml premix  Status:  Discontinued     750 mg 150 mL/hr over 60 Minutes Intravenous Every 12 hours 10/18/18 1541 10/20/18 1006   10/18/18 2000  ceFEPIme (MAXIPIME) 2 g in sodium chloride 0.9 % 100 mL IVPB     2 g 200 mL/hr over 30 Minutes Intravenous Every 8 hours 10/18/18 1243 10/30/18 0559   10/18/18 1600  azithromycin (ZITHROMAX) 500 mg in sodium chloride 0.9 % 250 mL IVPB     500 mg 250 mL/hr over 60 Minutes Intravenous Every 24 hours 10/18/18 1438 10/22/18 2252   10/18/18 1600  vancomycin (VANCOCIN) 1,250 mg in sodium chloride 0.9 % 250 mL IVPB     1,250 mg 166.7 mL/hr over 90 Minutes Intravenous  Once 10/18/18 1516 10/19/18 0100   10/15/18 1145  ceFEPIme (MAXIPIME) 2 g in sodium chloride 0.9 % 100 mL IVPB  Status:  Discontinued     2 g 200 mL/hr over 30 Minutes Intravenous Every 12 hours 10/15/18 1135 10/18/18 1243   10/14/18 1100  ceFEPIme (MAXIPIME) 2 g in sodium chloride 0.9 % 100 mL IVPB  Status:  Discontinued     2 g 200 mL/hr over 30 Minutes Intravenous Every 24 hours 10/13/18 1248 10/15/18 1135   10/14/18 0000  ceFAZolin (ANCEF) IVPB 2g/100 mL premix     2 g 200 mL/hr over 30 Minutes Intravenous To  Radiology 10/13/18 1216 10/14/18 0907   10/12/18 0445  ceFEPIme (MAXIPIME) 2 g in sodium chloride 0.9 % 100 mL IVPB  Status:  Discontinued     2 g 200 mL/hr over 30 Minutes Intravenous Every 12 hours 10/12/18 0439 10/13/18 1248   10/11/18 1100  levofloxacin (LEVAQUIN) IVPB 750 mg  Status:  Discontinued     750 mg 100 mL/hr over 90 Minutes Intravenous Every 24 hours 10/11/18 0956 10/12/18 0439   10/04/18 1330  ceFAZolin (ANCEF) powder 1 g  Status:  Discontinued     1 g Other To Surgery 10/04/18 1317 10/04/18 1454   10/04/18 1315  ceFAZolin (ANCEF) 2-4 GM/100ML-% IVPB    Note to Pharmacy: Unk Lightning  : cabinet override      10/04/18 1315 10/04/18 1317   10/03/18 0800  ceFAZolin (ANCEF) IVPB 2g/100 mL premix     2 g 200 mL/hr over 30 Minutes Intravenous To Radiology 10/02/18 0901 10/04/18 1346   09/27/18 1500  clindamycin (CLEOCIN) IVPB 600 mg     600  mg 100 mL/hr over 30 Minutes Intravenous Every 8 hours 09/27/18 1400 09/28/18 0644         Medications  Scheduled Meds:  chlorhexidine  15 mL Mouth Rinse BID   doxazosin  1 mg Per Tube Daily   feeding supplement (PRO-STAT SUGAR FREE 64)  30 mL Per Tube BID   free water  200 mL Per Tube Q4H   heparin injection (subcutaneous)  5,000 Units Subcutaneous Q8H   hydrocortisone   Rectal QID   lidocaine  1 application Urethral Once   mouth rinse  15 mL Mouth Rinse q12n4p   metoprolol tartrate  50 mg Per Tube BID   pantoprazole sodium  40 mg Per Tube Daily   scopolamine  1 patch Transdermal Q72H   simethicone  160 mg Per Tube QID   simvastatin  20 mg Per Tube q1800   Continuous Infusions:  sodium chloride Stopped (10/27/18 1603)   ceFEPime (MAXIPIME) IV 2 g (10/28/18 1305)   Molli Posey Peptide 1.5 325 mL (10/28/18 0641)   lactated ringers 10 mL/hr at 10/07/18 0859   sodium chloride 500 mL/hr at 10/14/18 2049   PRN Meds:.sodium chloride, alum & mag hydroxide-simeth, hydrALAZINE, hydrocortisone, hydrocortisone  cream, ipratropium-albuterol, lip balm, loperamide HCl, loratadine, metoprolol tartrate, morphine injection, Muscle Rub, ondansetron (ZOFRAN) IV, phenol, polyethylene glycol, polyvinyl alcohol, promethazine, senna-docusate, sodium chloride      Subjective:   Clif Dethomas was seen and examined today.  No acute complaints.  No new fevers chills, chest pain or any worsening shortness of breath.     Objective:   Vitals:   10/28/18 0632 10/28/18 0812 10/28/18 1145 10/28/18 1246  BP: (!) 92/50   113/70  Pulse: 79 81 81 88  Resp: 16 16 16 20   Temp: 98.6 F (37 C)   99 F (37.2 C)  TempSrc: Oral   Oral  SpO2: 98% 98% 96%   Weight:      Height:        Intake/Output Summary (Last 24 hours) at 10/28/2018 1325 Last data filed at 10/28/2018 1216 Gross per 24 hour  Intake 600 ml  Output 3200 ml  Net -2600 ml     Wt Readings from Last 3 Encounters:  10/27/18 58.5 kg  09/11/18 63.2 kg  04/25/18 70.4 kg   Physical Exam  General: Alert and oriented x 3, NAD  Eyes:   HEENT:  Trach +  Cardiovascular: S1 S2 clear, RRR. No pedal edema b/l  Respiratory: CTAB, no wheezing, rales or rhonchi  Gastrointestinal: Soft, nontender, PEG  Ext: no pedal edema bilaterally  Neuro: no new deficits  Musculoskeletal: No cyanosis, clubbing  Skin: No rashes  Psych: Normal affect and demeanor, alert and oriented x3     Data Reviewed:  I have personally reviewed following labs and imaging studies  Micro Results Recent Results (from the past 240 hour(s))  SARS Coronavirus 2 (CEPHEID - Performed in [facility] hospital lab), Hosp Order     Status: None   Collection Time: 10/18/18  5:01 PM   Specimen: Nasopharyngeal Swab  Result Value Ref Range Status   SARS Coronavirus 2 NEGATIVE NEGATIVE Final    Comment: (NOTE) If result is NEGATIVE SARS-CoV-2 target nucleic acids are NOT DETECTED. The SARS-CoV-2 RNA is generally detectable in upper and lower  respiratory specimens during the acute  phase of infection. The lowest  concentration of SARS-CoV-2 viral copies this assay can detect is 250  copies / mL. A negative result does not preclude SARS-CoV-2  infection  and should not be used as the sole basis for treatment or other  patient management decisions.  A negative result may occur with  improper specimen collection / handling, submission of specimen other  than nasopharyngeal swab, presence of viral mutation(s) within the  areas targeted by this assay, and inadequate number of viral copies  (<250 copies / mL). A negative result must be combined with clinical  observations, patient history, and epidemiological information. If result is POSITIVE SARS-CoV-2 target nucleic acids are DETECTED. The SARS-CoV-2 RNA is generally detectable in upper and lower  respiratory specimens dur ing the acute phase of infection.  Positive  results are indicative of active infection with SARS-CoV-2.  Clinical  correlation with patient history and other diagnostic information is  necessary to determine patient infection status.  Positive results do  not rule out bacterial infection or co-infection with other viruses. If result is PRESUMPTIVE POSTIVE SARS-CoV-2 nucleic acids MAY BE PRESENT.   A presumptive positive result was obtained on the submitted specimen  and confirmed on repeat testing.  While 2019 novel coronavirus  (SARS-CoV-2) nucleic acids may be present in the submitted sample  additional confirmatory testing may be necessary for epidemiological  and / or clinical management purposes  to differentiate between  SARS-CoV-2 and other Sarbecovirus currently known to infect humans.  If clinically indicated additional testing with an alternate test  methodology [phone]) is advised. The SARS-CoV-2 RNA is generally  detectable in upper and lower respiratory sp ecimens during the acute  phase of infection. The expected result is Negative. Fact Sheet for Patients:   BoilerBrush.com.cy Fact Sheet for Healthcare Providers: https://pope.com/ This test is not yet approved or cleared by the [place] FDA and has been authorized for detection and/or diagnosis of SARS-CoV-2 by FDA under an Emergency Use Authorization (EUA).  This EUA will remain in effect (meaning this test can be used) for the duration of the COVID-19 declaration under Section 564(b)(1) of the Act, 21 U.S.C. section 360bbb-3(b)(1), unless the authorization is terminated or revoked sooner. Performed at [facility], [address] [place], [place] [address]   C difficile quick scan w PCR reflex     Status: None   Collection Time: 10/18/18  8:32 PM   Specimen: STOOL  Result Value Ref Range Status   C Diff antigen NEGATIVE NEGATIVE Final   C Diff toxin NEGATIVE NEGATIVE Final   C Diff interpretation No C. difficile detected.  Final    Comment: Performed at [facility], [address], [place], [place] [address]  Culture, body fluid-bottle     Status: None   Collection Time: 10/19/18  3:20 PM   Specimen: Pleura  Result Value Ref Range Status   Specimen Description PLEURAL  Final   Special Requests NONE  Final   Culture   Final    NO GROWTH 5 DAYS Performed at [facility], [address], [place], [place] [address]    Report Status 10/24/2018 FINAL  Final  Gram stain     Status: None   Collection Time: 10/19/18  3:20 PM   Specimen: Pleura  Result Value Ref Range Status   Specimen Description PLEURAL  Final   Special Requests NONE  Final   Gram Stain   Final    RARE WBC PRESENT, PREDOMINANTLY PMN NO ORGANISMS SEEN Performed at [facility], [address] [place], [place] [address]    Report Status 10/20/2018 FINAL  Final  Radiology Reports Ct Abdomen Wo Contrast  Result Date: 10/01/2018 CLINICAL DATA:  73 year old male undergoing evaluation for gastrostomy tube  placement. EXAM: CT ABDOMEN WITHOUT CONTRAST TECHNIQUE: Multidetector CT imaging of the abdomen was performed following the standard protocol without IV contrast. COMPARISON:  Prior abdominal ultrasound 12/25/2016 FINDINGS: Lower chest: Mild lower lobe bronchial wall thickening. Trace atelectasis versus infiltrate in the dependent portion of the right lower lobe. No suspicious pulmonary nodule or mass. The visualized heart is normal in size. Unremarkable distal thoracic esophagus. Hepatobiliary: Normal hepatic contour and morphology. No discrete hepatic lesion. The gallbladder lumen is filled with high density material likely representing sludge and/or small stones. No biliary ductal dilatation. Pancreas: Unremarkable. No pancreatic ductal dilatation or surrounding inflammatory changes. Spleen: Normal in size without focal abnormality. Adrenals/Urinary Tract: Normal adrenal glands. Circumscribed low-attenuation lesions within the right kidney are incompletely characterized in the absence of intravenous contrast. However, a sonographically simple cyst was previously identified in the lower pole. No evidence of hydronephrosis or nephrolithiasis. Stomach/Bowel: High-riding transverse colon which lies anterior to the gastric body and antrum. Otherwise, normal gastric anatomy. No focal bowel wall thickening or evidence of obstruction. Vascular/Lymphatic: Limited evaluation in the absence of intravenous contrast. Extensive atherosclerotic vascular calcifications. No aneurysm. Other: No ascites or abdominal wall hernia. Musculoskeletal: No acute fracture or aggressive appearing lytic or blastic osseous lesion. IMPRESSION: 1. High-riding transverse colon which lies anterior to the gastric body and antrum. Percutaneous gastrostomy tube placement may be possible, however will require good visualization of the transverse colon at the time of attempted procedure. Consider administration of barium prior to the procedure. 2.   Aortic Atherosclerosis (ICD10-170.0). 3. Right lower lobe atelectasis versus changes of small volume aspiration. 4. Sludge and/or small stones in the gallbladder lumen. Electronically Signed   By: Malachy Moan M.D.   On: 10/01/2018 14:18   Dg Orthopantogram  Result Date: 10/08/2018 CLINICAL DATA:  Poor dentition. Preoperative for laryngeal carcinoma. EXAM: ORTHOPANTOGRAM/PANORAMIC COMPARISON:  None. FINDINGS: Missing LEFT maxillary teeth from a first bicuspid through distal molar. Missing RIGHT maxillary teeth from second bicuspid through distal molar. Missing RIGHT mandibular teeth from first through distal molar. Missing LEFT mandibular first molar. No periapical lucency to suggest abscess. No definite caries. IMPRESSION: 1. Numerous missing mandibular and maxillary teeth, as detailed above. 2. No evidence of periapical dental abscess. Electronically Signed   By: Bary Richard M.D.   On: 10/08/2018 13:27   Dg Chest 1 View  Result Date: 10/19/2018 CLINICAL DATA:  S/P right thoracentesis. EXAM: CHEST  1 VIEW COMPARISON:  10/18/2018 FINDINGS: Tracheostomy tube is unchanged. The heart size is normal. Small RIGHT pleural effusion, decreased since prior study. No pneumothorax. Minimal bibasilar atelectasis. IMPRESSION: Decreased RIGHT pleural effusion. No pneumothorax. Electronically Signed   By: Norva Pavlov M.D.   On: 10/19/2018 15:43   Dg Abd 1 View  Result Date: 10/05/2018 CLINICAL DATA:  Is a gastric tube placement. EXAM: ABDOMEN - 1 VIEW COMPARISON:  October 04, 2018 FINDINGS: Nonobstructive bowel gas pattern. Residual contrast throughout the colon and in the appendix. Nonobstructive bowel gas pattern. Enteric catheter overlies the expected location of the gastric cardia with side hole at the expected location of the GE junction. IMPRESSION: Enteric catheter overlies the expected location of the gastric cardia with side hole at the expected location of the GE junction. Electronically Signed   By:  Ted Mcalpine M.D.   On: 10/05/2018 15:55   Dg Abd 1 View  Result Date: 10/02/2018 CLINICAL DATA:  Enteric tube placement. EXAM: ABDOMEN - 1 VIEW COMPARISON:  CT abdomen from yesterday. FINDINGS: Enteric tube tip in the stomach with the proximal side port in the lower esophagus. Nonobstructive bowel gas pattern. No acute osseous abnormality. IMPRESSION: 1. Enteric tube tip in the stomach with proximal side port in the lower esophagus. Recommend advancing 7-8 cm. Electronically Signed   By: Obie Dredge M.D.   On: 10/02/2018 18:59   Ct Soft Tissue Neck W Contrast  Result Date: 09/28/2018 CLINICAL DATA:  73 year old male status post tracheostomy and laryngeal biopsy postoperative day 1. Findings suspicious for squamous cell carcinoma. EXAM: CT NECK WITH CONTRAST TECHNIQUE: Multidetector CT imaging of the neck was performed using the standard protocol following the bolus administration of intravenous contrast. CONTRAST:  75mL OMNIPAQUE IOHEXOL 300 MG/ML  SOLN COMPARISON:  Neck CT 02/22/2018. FINDINGS: Pharynx and larynx: Tracheostomy has been placed, and there is a small volume of soft tissue gas tracking in the retropharyngeal space, in the bilateral neck, and into the visible mediastinum. The tube appears well positioned. There is debris in the cricoid above the tube. Trace retained secretions or debris in the trachea below the tube. There is an infiltrative and fungating heterogeneously enhancing mass now occupying the larynx, chiefly at and above the glottis, but also possibly infiltrating and involving the cervical esophagus below the glottis (series 3, image 80). There is extension through the right thyroid cartilage and contiguous appearing tumor - probably ex nodal disease - in the right lower neck lateral to the carotid space (series 3, image 64). There is associated narrowing of the right IJ at that level, which remains patent. All told, the poorly marginated hyperenhancing tumor encompasses  35 x 76 x 66 millimeters (AP by transverse by CC). See series 3, image 63, 64, 74, and coronal images 51 through 54. Tumor extends to the AE folds and the mucosa hypopharynx is generally hyperenhancing. The oropharynx and nasopharynx are spared. Negative superior parapharyngeal spaces, the right lower paralaryngeal spaces are abnormal. The retropharyngeal space contains gas from the tracheostomy. Salivary glands: Negative sublingual space. The submandibular glands and parotid glands remain within normal limits. Thyroid: Postoperative changes from tracheostomy, but the posterior right thyroid lobe also appears abutted by tumor on series 3, image 84. Lymph nodes: Extensive right level 3 Aks nodal disease suspected, resulting in a semi contiguous appearance of tumor from the level 3 station into the larynx. Malignant right level 2A lymph nodes measuring 16 millimeter short axis on series 3, image 47. No level 1 node involvement. No abnormal level 4 nodes are evident. There are small but conspicuous left level 2 B (series 3, image 40) and level IIIb (image 61) lymph nodes which are larger than in 2019, up to 7 millimeters diameter. Vascular: Mass effect on the right internal jugular vein from tumor in the right lower neck, but the vein remains patent. Tumor surrounds the right lower carotid space but the right carotid remains patent with calcified atherosclerosis. Other major vascular structures in the neck and at the skull base are patent. Limited intracranial: Negative. Visualized orbits: Negative. Mastoids and visualized paranasal sinuses: Clear. Skeleton: Hyperenhancing tumor appears to occupy the prevertebral space in the lower cervical spine from C4-C5 through C7-T1, and probably is associated with cervical esophageal involvement. See series 3, image 80 and sagittal image 52. No bone erosion identified. No lytic or suspicious osseous lesion identified elsewhere. Upper chest: Centrilobular emphysema. Stable small  right apical nodules on series 9, image 88, and left  apically nodule on image 94, which are likely related to lung scarring. Small volume pneumomediastinum following tracheostomy tube placement. Distal to the tube the trachea appears patent with some retained secretions. No superior mediastinal lymphadenopathy. Calcified aortic atherosclerosis. IMPRESSION: 1. Infiltrative and poorly marginated enhancing tumor in the lower neck eccentric to the right appears to involve the larynx, the adjacent cervical esophagus, and is somewhat contiguous with ex-nodal disease in the right level 3 station, surrounding the right carotid space and narrowing the right IJ which remains patent. - all told tumor encompasses 35 x 76 x 66 mm (AP by transverse by CC) - prevertebral space involvement suspected C4-C5 through C7-T1 but no underlying bony changes. - malignant right level 2 nodal disease, and small but suspicious contralateral left level 2 and left level 3 nodes. 2. Status post tracheostomy with small volume pneumomediastinum and soft tissue gas in the neck. The airway appears patent below the tracheostomy tube and no adverse features are identified. Electronically Signed   By: Odessa Fleming M.D.   On: 09/28/2018 17:34   Ct Chest W Contrast  Result Date: 09/29/2018 CLINICAL DATA:  Neck mass, advanced neck cancer, status post tracheostomy. EXAM: CT CHEST WITH CONTRAST TECHNIQUE: Multidetector CT imaging of the chest was performed during intravenous contrast administration. CONTRAST:  75mL OMNIPAQUE IOHEXOL 300 MG/ML  SOLN COMPARISON:  09/28/2018 neck CT. FINDINGS: Cardiovascular: Normal heart size. No significant pericardial effusion/thickening. Three-vessel coronary atherosclerosis. Atherosclerotic nonaneurysmal thoracic aorta. Normal caliber pulmonary arteries. No central pulmonary emboli. Mediastinum/Nodes: No discrete thyroid nodules. Normal thoracic esophagus. Infiltrative right neck mass involving larynx and cervical esophagus,  as detailed on neck CT from 1 day prior. Subcutaneous emphysema scattered in the lower neck bilaterally. Scattered pneumomediastinum throughout the bilateral paratracheal and paraesophageal regions. No pathologically enlarged axillary, mediastinal or hilar lymph nodes. Lungs/Pleura: No pneumothorax. No pleural effusion. Moderate centrilobular emphysema with diffuse bronchial wall thickening. Tracheostomy tube tip is in the tracheal lumen just below thoracic inlet. No acute consolidative airspace disease or lung masses. Scattered calcified granulomas throughout the medial right lower lobe. A few scattered solid pulmonary nodules in the upper lobes bilaterally, predominantly at the lung apices, largest 6 mm at the apical left upper lobe (series 5/image 17) and 5 mm in the apical right upper lobe (series 5/image 18), all stable since 02/22/2018 CT. Upper abdomen: No acute abnormality. Musculoskeletal: No aggressive appearing focal osseous lesions. Mild symmetric bilateral gynecomastia. Moderate thoracic spondylosis. IMPRESSION: 1. No thoracic adenopathy or other findings suspicious for metastatic disease in the chest. 2. Bilateral upper lobe solid pulmonary nodules, largest 6 mm, all stable since 02/22/2018 neck CT, more likely benign. Suggest follow-up chest CT in 12 months to demonstrate continued stability. 3. Infiltrative right neck mass involving the larynx and cervical esophagus, as detailed on neck CT from 1 day prior. 4. Well-positioned tracheostomy tube. 5. Three-vessel coronary atherosclerosis. Aortic Atherosclerosis (ICD10-I70.0) and Emphysema (ICD10-J43.9). Electronically Signed   By: Delbert Phenix M.D.   On: 09/29/2018 13:41   Ir Gastrostomy Tube Mod Sed  Result Date: 10/04/2018 INDICATION: 73 year old male with dysphagia EXAM: PERC PLACEMENT GASTROSTOMY MEDICATIONS: 2 g Ancef ANESTHESIA/SEDATION: Versed 0.5 mg IV; Fentanyl 25 mcg IV Moderate Sedation Time:  0 The patient was continuously monitored  during the procedure by the interventional radiology nurse under my direct supervision. CONTRAST:  None FLUOROSCOPY TIME:  Fluoroscopy Time: 0 minutes 30 seconds (4 mGy). COMPLICATIONS: None immediate. PROCEDURE: Informed written consent was obtained from the patient's family after a thorough discussion of the  procedural risks, benefits and alternatives. All questions were addressed. Maximal Sterile Barrier Technique was utilized including caps, mask, sterile gowns, sterile gloves, sterile drape, hand hygiene and skin antiseptic. A timeout was performed prior to the initiation of the procedure. The epigastrium was prepped with Betadine in a sterile fashion, and a sterile drape was applied covering the operative field. A sterile gown and sterile gloves were used for the procedure. A 5-French orogastric tube is placed under fluoroscopic guidance. Scout imaging of the abdomen confirms barium within the transverse colon. The stomach was distended with gas. Multiple x-ray performed with insufflation of the stomach. The partially barium filled colon did not migrate caudally with insufflation of the stomach. X-rays were performed. We withdrew from the procedure given the anatomy. Patient tolerated the procedure well and remained hemodynamically stable throughout. No complications were encountered and no significant blood loss encountered. IMPRESSION: Attempt at percutaneous gastrostomy demonstrates insufficient anatomy for image guided placement. The transverse colon did not migrate caudally with insufflation of the stomach. Signed, Yvone Neu. Loreta Ave, DO [facility] Radiology Electronically Signed   By: Gilmer Mor D.O.   On: 10/04/2018 14:25   Ir Removal [employer] W/ [place] W/o Fl Mod Sed  Result Date: 10/14/2018 CLINICAL DATA:  History of head and neck squamous cell carcinoma. Status post right upper chest subcutaneous Port-A-Cath placement on 10/04/2018 with catheter  access via the right internal jugular vein. Development Pseudomonas bacteremia now requiring Port-A-Cath removal due to suspected catheter seeding/source for infection. EXAM: REMOVAL OF IMPLANTED TUNNELED PORT-A-CATH MEDICATIONS: None PROCEDURE: The right chest Port-A-Cath site was prepped with chlorhexidine. A sterile gown and gloves were worn during the procedure. Local anesthesia was provided with 1% lidocaine. An incision was made overlying the Port-A-Cath with a #15 scalpel. Utilizing sharp and blunt dissection, the Port-A-Cath was removed. The pocked was debrided with gauze soaked in sterile saline. Iodoform gauze was then packed into the wound. A dressing was then applied over the iodoform gauze. FINDINGS: After making an incision, the port pocket was inspected and demonstrates bloody fluid which is mildly turbulent and felt to be likely infected. Tissue was also indurated and therefore the pocket was left open to heal secondarily. After debridement, iodoform gauze was packed into the port wound. The entire Port-A-Cath and attached catheter were successfully removed. IMPRESSION: Removal of implanted Port-A-Cath utilizing sharp and blunt dissection. The port pocket demonstrates what appears to be likely infected hematoma. After debridement of bloody fluid and clot, the wound was packed with iodoform gauze. Electronically Signed   By: Irish Lack M.D.   On: 10/14/2018 14:34   Dg Chest Port 1 View  Result Date: 10/18/2018 CLINICAL DATA:  Shortness of breath. EXAM: PORTABLE CHEST 1 VIEW COMPARISON:  10/11/2018. FINDINGS: Interval removal of PowerPort catheter. Tracheostomy tube noted in stable position. Heart size normal. Right base infiltrate with right-sided pleural effusion. No pneumothorax. IMPRESSION: 1. Interval removal of PowerPort catheter. Tracheostomy tube noted with tip in stable position. 2.  Right base infiltrate with right-sided pleural effusion. Electronically Signed   By: Maisie Fus  Register    On: 10/18/2018 13:35   Dg Chest Port 1 View  Result Date: 10/11/2018 CLINICAL DATA:  Nausea and vomiting yesterday.  New tracheostomy. EXAM: PORTABLE CHEST 1 VIEW COMPARISON:  Chest CT 09/29/2018 FINDINGS: Tracheostomy tube appears in adequate position. Right IJ Port-A-Cath has tip over the SVC. Lungs are adequately inflated without focal airspace consolidation or effusion. No pneumothorax. Cardiomediastinal silhouette is within normal. Dense  radiopaque material/calcifications over the posteromedial right lower lobe as seen on CT scan. Remainder of the exam is unchanged. IMPRESSION: No acute cardiopulmonary disease. Tracheostomy tube and right IJ Port-A-Cath in adequate position. Electronically Signed   By: Elberta Fortis M.D.   On: 10/11/2018 15:40   Dg Abd Portable 1v  Result Date: 10/11/2018 CLINICAL DATA:  New tracheostomy tube. Nausea and vomiting yesterday. EXAM: PORTABLE ABDOMEN - 1 VIEW COMPARISON:  10/05/2018 FINDINGS: Percutaneous gastrostomy tube projects over the stomach in the left upper quadrant. Bowel gas pattern is nonobstructive. No free peritoneal air. Remainder of the exam is unchanged. IMPRESSION: Nonobstructive bowel gas pattern. Gastrostomy tube projects over the stomach in the left upper quadrant. Electronically Signed   By: Elberta Fortis M.D.   On: 10/11/2018 15:41   Dg Abd Portable 1v  Result Date: 10/03/2018 CLINICAL DATA:  Dysphagia. EXAM: PORTABLE ABDOMEN - 1 VIEW COMPARISON:  Radiograph of October 02, 2018. FINDINGS: The bowel gas pattern is normal. Residual contrast is noted throughout the colon. No radio-opaque calculi or other significant radiographic abnormality are seen. IMPRESSION: No evidence of bowel obstruction or ileus. Electronically Signed   By: Lupita Raider M.D.   On: 10/03/2018 07:52   Dg Abd Portable 1v  Result Date: 10/02/2018 CLINICAL DATA:  Nasogastric tube advancement. EXAM: PORTABLE ABDOMEN - 1 VIEW COMPARISON:  10/02/2018 at 1839 hours FINDINGS: The  nasogastric tube has been advanced. Tip now extends further into the stomach with the side hole entering the proximal stomach. Normal bowel gas pattern. IMPRESSION: 1. Advanced nasogastric tube, now fully within the stomach. Electronically Signed   By: Amie Portland M.D.   On: 10/02/2018 20:45   Ir Imaging Guided Port Insertion  Result Date: 10/04/2018 INDICATION: 73 year old male with a history squamous cell carcinoma and dysphagia EXAM: IMPLANTED PORT A CATH PLACEMENT WITH ULTRASOUND AND FLUOROSCOPIC GUIDANCE MEDICATIONS: 2 g Ancef; The antibiotic was administered within an appropriate time interval prior to skin puncture. ANESTHESIA/SEDATION: Moderate (conscious) sedation was employed during this procedure. A total of Versed 1.0 mg and Fentanyl 25 mcg was administered intravenously. Moderate Sedation Time: 19 minutes. The patient's level of consciousness and vital signs were monitored continuously by radiology nursing throughout the procedure under my direct supervision. FLUOROSCOPY TIME:  0 minutes, 6 seconds (1 mGy) COMPLICATIONS: None PROCEDURE: The procedure, risks, benefits, and alternatives were explained to the patient. Questions regarding the procedure were encouraged and answered. The patient understands and consents to the procedure. Ultrasound survey was performed with images stored and sent to PACs. The right neck and chest was prepped with chlorhexidine, and draped in the usual sterile fashion using maximum barrier technique (cap and mask, sterile gown, sterile gloves, large sterile sheet, hand hygiene and cutaneous antiseptic). Antibiotic prophylaxis was provided with 2.0g Ancef administered IV one hour prior to skin incision. Local anesthesia was attained by infiltration with 1% lidocaine without epinephrine. Ultrasound demonstrated patency of the right internal jugular vein, and this was documented with an image. Under real-time ultrasound guidance, this vein was accessed with a 21 gauge  micropuncture needle and image documentation was performed. A small dermatotomy was made at the access site with an 11 scalpel. A 0.018" wire was advanced into the SVC and used to estimate the length of the internal catheter. The access needle exchanged for a 24F micropuncture vascular sheath. The 0.018" wire was then removed and a 0.035" wire advanced into the IVC. An appropriate location for the subcutaneous reservoir was selected below the clavicle and  an incision was made through the skin and underlying soft tissues. The subcutaneous tissues were then dissected using a combination of blunt and sharp surgical technique and a pocket was formed. A single lumen power injectable portacatheter was then tunneled through the subcutaneous tissues from the pocket to the dermatotomy and the port reservoir placed within the subcutaneous pocket. The venous access site was then serially dilated and a peel away vascular sheath placed over the wire. The wire was removed and the port catheter advanced into position under fluoroscopic guidance. The catheter tip is positioned in the cavoatrial junction. This was documented with a spot image. The portacatheter was then tested and found to flush and aspirate well. The port was flushed with saline followed by 100 units/mL heparinized saline. The pocket was then closed in two layers using first subdermal inverted interrupted absorbable sutures followed by a running subcuticular suture. The epidermis was then sealed with Dermabond. The dermatotomy at the venous access site was also seal with Dermabond. Patient tolerated the procedure well and remained hemodynamically stable throughout. No complications encountered and no significant blood loss encountered IMPRESSION: Status post port catheter placement.  Catheter ready for use. Signed, Yvone Neu. Reyne Dumas, RPVI [facility] Radiology Electronically Signed   By: Gilmer Mor D.O.   On:  10/04/2018 14:21   US Thoracentesis Asp Pleural Space W/img Guide  Result Date: 10/19/2018 INDICATION: Shortness of breath. Right-sided pleural effusion. Request for diagnostic and therapeutic thoracentesis. EXAM: ULTRASOUND GUIDED RIGHT THORACENTESIS MEDICATIONS: None. COMPLICATIONS: None immediate. PROCEDURE: An ultrasound guided thoracentesis was thoroughly discussed with the patient and questions answered. The benefits, risks, alternatives and complications were also discussed. The patient understands and wishes to proceed with the procedure. Written consent was obtained. Ultrasound was performed to localize and mark an adequate pocket of fluid in the right chest. The area was then prepped and draped in the normal sterile fashion. 1% Lidocaine was used for local anesthesia. Under ultrasound guidance a 6 Fr Safe-T-Centesis catheter was introduced. Thoracentesis was performed. The catheter was removed and a dressing applied. FINDINGS: A total of approximately 450 mL of clear, pale yellow fluid was removed. Samples were sent to the laboratory as requested by the clinical team. IMPRESSION: Successful ultrasound guided right thoracentesis yielding 450 mL of pleural fluid. Read by: Brayton El PA-C Electronically Signed   By: Simonne Come M.D.   On: 10/19/2018 15:29    Lab Data:  CBC: Recent Labs  Lab 10/22/18 0417 10/23/18 0438 10/24/18 0457 10/27/18 0538  WBC 11.5* 10.9* 9.7 7.8  HGB 8.9* 9.0* 9.7* 10.3*  HCT 30.1* 30.4* 31.2* 34.1*  MCV 93.5 93.0 91.8 91.2  PLT 316 328 294 343   Basic Metabolic Panel: Recent Labs  Lab 10/23/18 0438 10/24/18 0457 10/25/18 0438 10/27/18 0538 10/28/18 0435  NA 138 138 138 137 137  K 4.2 4.3 4.3 4.3 4.2  CL 104 100 101 101 102  CO2 27 29 30 27 25   GLUCOSE 115* 104* 108* 117* 117*  BUN 15 15 13 17 19   CREATININE 0.66 0.74 0.66 0.66 0.73  CALCIUM 8.3* 8.8* 9.1 8.8* 8.6*  MG  --   --  2.0  --   --   PHOS  --   --  4.4  --   --    GFR: Estimated  Creatinine Clearance: 70.1 mL/min (by C-G formula based on SCr of 0.73 mg/dL). Liver Function Tests: Recent Labs  Lab 10/25/18 0438  AST 21  ALT 33  ALKPHOS 72  BILITOT 0.5  PROT 6.3*  ALBUMIN 2.5*   No results for input(s): LIPASE, AMYLASE in the last 168 hours. No results for input(s): AMMONIA in the last 168 hours. Coagulation Profile: No results for input(s): INR, PROTIME in the last 168 hours. Cardiac Enzymes: No results for input(s): CKTOTAL, CKMB, CKMBINDEX, TROPONINI in the last 168 hours. BNP (last 3 results) No results for input(s): PROBNP in the last 8760 hours. HbA1C: No results for input(s): HGBA1C in the last 72 hours. CBG: Recent Labs  Lab 10/27/18 1647 10/27/18 2101 10/28/18 0419 10/28/18 0728 10/28/18 1110  GLUCAP 98 97 112* 107* 105*   Lipid Profile: No results for input(s): CHOL, HDL, LDLCALC, TRIG, CHOLHDL, LDLDIRECT in the last 72 hours. Thyroid Function Tests: No results for input(s): TSH, T4TOTAL, FREET4, T3FREE, THYROIDAB in the last 72 hours. Anemia Panel: No results for input(s): VITAMINB12, FOLATE, FERRITIN, TIBC, IRON, RETICCTPCT in the last 72 hours. Urine analysis:    Component Value Date/Time   COLORURINE YELLOW 03/30/2015 1330   APPEARANCEUR CLEAR 03/30/2015 1330   LABSPEC >1.030 (H) 03/30/2015 1330   PHURINE 5.5 03/30/2015 1330   GLUCOSEU NEGATIVE 03/30/2015 1330   HGBUR TRACE (A) 03/30/2015 1330   BILIRUBINUR NEGATIVE 03/30/2015 1330   KETONESUR NEGATIVE 03/30/2015 1330   PROTEINUR NEGATIVE 03/30/2015 1330   NITRITE NEGATIVE 03/30/2015 1330   LEUKOCYTESUR NEGATIVE 03/30/2015 1330     Taima Rada M.D. [facility] 10/28/2018, 1:25 PM  Pager: [phone] Between 7am to 7pm - call Pager - [phone]  After 7pm go to [email or web address] - password [email or web address]  Call night coverage person covering after 7pm

## 2018-10-28 NOTE — Progress Notes (Signed)
pt's trach was inserted on 09/30/18 and is due to be changed on 10/31/18

## 2018-10-28 NOTE — TOC Progression Note (Signed)
Transition of Care [facility]) - Progression Note    Patient Details  Name: Logan Price MRN: [id number] Date of Birth: [birth year]  [facility] CM/SW Contact  Logan Courts, RN Phone Number: 10/28/2018, 12:48 PM  Clinical Narrative:    CM spoke with patient at bedside and presented bed offer from [facility], patient asks CM speak with brother, Logan Price. Cm spoke with brother over the telephone and they accept bed offer at [facility]. Facility rep, Logan Price notified of this. Per rep patient will need a negative covid within 3 days of dc. MD notified of this.     Expected Discharge Plan: [place] Barriers to Discharge: Continued Medical Work up  Expected Discharge Plan and Services Expected Discharge Plan: [place]   Discharge Planning Services: CM Consult Post Acute Care Choice: Home Health   Expected Discharge Date: 10/10/18               DME Arranged: Tube feeding, Tube feeding pump, Trach supplies DME Agency: [employer] Date DME Agency Contacted: 10/10/18 Time DME Agency Contacted: 1000 Representative spoke with at DME Agency: Logan Price HH Arranged: RN, PT, OT, Nurse's Aide, Social Work [employer] Agency: Logan Price ([place]) Date [place]: 10/03/18 Time [place]: 1152 Representative spoke with at [place]: [place] ([place]) Interventions    Readmission Risk Interventions No flowsheet data found.

## 2018-10-28 NOTE — Progress Notes (Signed)
SLP spoke to RN,  Note plan for pt to dc after negative COVID test and ABX to stop 10/29/2018.  Spoke to RN and advised recommend MBS before dc to determine if pt can tolerate any po. RN agrees with plan.  Pt has been resistant to consume po intake but given medical stability, would like to proceed.  Secure chatted MD to request order for 10/29/2018 am.  Thanks.    Luanna Salk, [place] SLP Acute Rehab Services Pager [phone] Office [phone]

## 2018-10-29 ENCOUNTER — Inpatient Hospital Stay (HOSPITAL_COMMUNITY): Payer: Medicare Other

## 2018-10-29 ENCOUNTER — Ambulatory Visit
Admit: 2018-10-29 | Discharge: 2018-10-29 | Disposition: A | Discharge status: Home / Self Care | Payer: Medicare Other | Attending: Radiation Oncology | Admitting: Radiation Oncology

## 2018-10-29 LAB — BASIC METABOLIC PANEL
Anion gap: 8 (ref 5–15)
BUN: 20 mg/dL (ref 8–23)
CO2: 28 mmol/L (ref 22–32)
Calcium: 8.9 mg/dL (ref 8.9–10.3)
Chloride: 102 mmol/L (ref 98–111)
Creatinine, Ser: 0.71 mg/dL (ref 0.61–1.24)
GFR calc Af Amer: >60 mL/min (ref >60)
GFR calc non Af Amer: >60 mL/min (ref >60)
Glucose, Bld: 119 mg/dL — ABNORMAL HIGH (ref 70–99)
Potassium: 4.3 mmol/L (ref 3.5–5.1)
Sodium: 138 mmol/L (ref 135–145)

## 2018-10-29 LAB — GLUCOSE, CAPILLARY
Glucose-Capillary: 103 mg/dL — ABNORMAL HIGH (ref 70–99)
Glucose-Capillary: 105 mg/dL — ABNORMAL HIGH (ref 70–99)
Glucose-Capillary: 111 mg/dL — ABNORMAL HIGH (ref 70–99)
Glucose-Capillary: 116 mg/dL — ABNORMAL HIGH (ref 70–99)
Glucose-Capillary: 93 mg/dL (ref 70–99)
Glucose-Capillary: 94 mg/dL (ref 70–99)

## 2018-10-29 LAB — ZINC: Zinc: 76 ug/dL (ref 56–134)

## 2018-10-29 NOTE — Progress Notes (Signed)
Upon entering pts room; pt was on the bsc with PEG tube on the floor. Pt denies discomfort or pain during this time and was unaware PEG tube had came out. Rai MD notified and ordered to place a foley to keep PEG tube incision open. 76fr foley catheter was placed in site and typed to secure. Pt denies pain at this time with no s/s of distress.

## 2018-10-29 NOTE — Progress Notes (Signed)
Nutrition Follow-up  RD working remotely.   DOCUMENTATION CODES:   Severe malnutrition in context of chronic illness  INTERVENTION:  - once discharged to [facility]: [employer] Peptide 1.5 @ 60 ml/hr (4.5 cans/day via pump) with 30 ml prostat (or equivalent) BID and 200 ml free water every 4 hours. - this regimen provides 2418 kcal, 131 grams protein, 13 grams of fiber, and 1200 ml free water from flushes.  - communicated with [employer] rep to provide TF to facility as this formula is not available there.    NUTRITION DIAGNOSIS:   Severe Malnutrition related to chronic illness, cancer and cancer related treatments as evidenced by percent weight loss, severe muscle depletion, moderate muscle depletion, mild fat depletion. -ongoing  GOAL:   Patient will meet greater than or equal to 90% of their needs -met with TF regimen  MONITOR:   Labs, I & O's, Skin, TF tolerance, Weight trends  ASSESSMENT:   Patient with PMH significant for diverticulitis, gastritis, HLD, HTN, laryngeal cancer, and esophogeal dysphagia. 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  Significant Events: 7/3- admission; tracheostomy with laryngoscopy and biopsy 7/8- initial RD assessment 7/11- NGT placed and TF initiated (Osmolite 1.5) 7/13- NFPE completed and patient identified to meet criteria for severe malnutrition; G-tube placed 7/16- transitioned from continuous TF to bolus TF (Osomolite 1.5); imodium started 7/17- c.diff checked--negative 7/20- port removal by IR d/t pseudomonas bacteremia 7/22- transferred from [facility] to [place] for initiation of radiation; radiation simulation 7/23- changed TF regimen to half Jevity 1.5 and half Osmolite 1.5; increased water flush to 150 ml x6/day 7/25- thoracentesis with 450 ml removed 7/27- changed TF regimen to 1 can [employer] Peptide 1.5 x5/day with 150 ml free water x6/day 7/28- first radiation  treatment 7/29- decreased from 5 TF boluses/day to 3 TF boluses/day d/t feelings of fullness 7/30- type 5 BM   SLP following and performed MBS this AM. Recommendation following the test is for patient to remains NPO with ice chips and free water protocol following mouth care.  He has PEG in place and is receiving [employer] Peptide 1.5 @ 60 ml/hr with 30 ml prostat BID and 200 ml free water every 4 hours. This regimen meets 100% estimated needs. Last weight was on 8/2 at which time weight had been stable since 7/30. Per review of flow sheet, patient had at least 2 BMs (type 5) yesterday and 1 BM (type 5) today. This is an improvement as patient was previously only having type 7 stools.   TF regimen outlined above for when he discharges. Able to communicate with CM to confirm discharge plan.    Labs reviewed; CBGs: 103, 116, and 105 mg/dl today. Medications reviewed; 102 mg mylicon per PEG QID.    Diet Order:   Diet Order            Diet NPO time specified Except for: Sips with Meds  Diet effective midnight        Diet general              EDUCATION NEEDS:   Not appropriate for education at this time  Skin:  Skin Assessment: Skin Integrity Issues: Skin Integrity Issues:: Incisions, Other (Comment) Incisions: neck/abdomen, R chest (7/20) Other: new wound-sacrum  Last BM:  8/4  Height:   Ht Readings from Last 1 Encounters:  09/27/18 5' 9"  (1.753 m)    Weight:   Wt Readings from Last 1 Encounters:  10/27/18 58.5 kg    Ideal Body Weight:  72.7 kg  BMI:  Body mass index is 19.05 kg/m.  Estimated Nutritional Needs:   Kcal:  2300-2500 kcal  Protein:  115-130 grams  Fluid:  >/= 2.3 L/day     Jarome Matin, MS, RD, LDN, [facility] Inpatient Clinical Dietitian Pager # [phone] After hours/weekend pager # [phone]

## 2018-10-29 NOTE — Progress Notes (Signed)
Pt wasn't in the room to check his trach. RT will come back later to check

## 2018-10-29 NOTE — Progress Notes (Addendum)
Modified Barium Swallow Progress Note  Patient Details  Name: Logan Price MRN: [id number] Date of Birth: [birth year]  Today's Date: 10/29/2018  Modified Barium Swallow completed.  Full report located under Chart Review in the Imaging Section.  Brief recommendations include the following:  Clinical Impression Patient presents with severe pharyngeal dysphagia characterized by decreased tongue base retraction, laryngeal elevation/closure resulting in laryngeal penetration and trace aspiration (silent). Pt  conducts multiple swallows - piecemealing with each bolus; self-created compensation strategy likely.  Trace aspiration cleared with cued cough/expectoration.  Pt was tested with PMSV on only.  SLP did not tax pt - only had him consume very small boluses to keep him from aspirating.    Recommend pt have ice chips and water independently; trials of other po (thin, nectar, puree) with SLP.  Using live monitor, pt educated to findings/compensation strategies with teach back.  He will need SLP at next venue of care to continue swallowing management.  Due to sensori-motor deficit, he will need repeat MBS prior to transitioning into po.      Swallow Evaluation Recommendations       SLP Diet Recommendations: NPO;Ice chips PRN after oral care;Free water protocol after oral care   Liquid Administration via: Cup;Spoon   Medication Administration: Via alternative means               Oral Care Recommendations: Oral care QID   Other Recommendations: Have oral suction available   Luanna Salk, [facility] SLP [place] Pager [phone] Office [phone]  Logan Price 10/29/2018,9:47 AM

## 2018-10-29 NOTE — Progress Notes (Signed)
[facility]                                                                              Patient Demographics  Logan Price, is a 73 y.o. male, DOB - [birth year], ZOX:[id number]  Admit date - 09/27/2018   Admitting Physician [name], MD  Outpatient Primary MD for the patient is [name], FNP  Outpatient specialists:   LOS - 32  days   Medical records reviewed and are as summarized below:    No chief complaint on file.      Brief summary  73 year old with history of GERD, essential hypertension, hyperlipidemia who recently diagnosed neck mass concerning for malignancy was admitted to the hospital. Overnight patient went into SVT therefore medical team was consulted. Patient was given adenosine x1 which converted back to normal sinus rhythm. G-tube placed by surgery 7/13, tolerating tube feeds. Metoprolol was started via PEG tube. Hospitals course was later on complicated by Pseudomonas bacteremia secondary to infected Port-A-Cath. Patient was transferred to medicine service. Infectious disease team were consulted who recommended removing Port-A-Cath.    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  Trach care per ENT. -Per patient, on 7 weeks radiation cycles, will finish antibiotics today.  -Repeat COVID test on 8/3 for skilled nursing facility still pending   Active problems Pseudomonas  bacteremia -Likely source Port-A-Cath, removed on 10/14/2018. -ID recommended IV cefepime for total 2 weeks post Port-A-Cath removal, end date on 10/29/2018 -Repeat blood cultures negative till date -We will finish IV cefepime today   Acute kidney injury secondary to acute urinary retention -Patient had over 1 L of urine in his bladder, creatinine 5.3 on 7/20, after placing Foley catheter, creatinine has been steadily improving -Creatinine stable 0.6, continue Foley catheter, had failed voiding trial and Foley catheter had to be placed back in -Patient will need urology outpatient follow-up for voiding trial and further evaluation.  Paroxysmal SVT, intermittent and recurrent -Patient had 25 beats run of nonsustained V. tach on 10/20/2018 a.m. -Patient was seen by cardiology, currently signed off.  2D echo normal, TSH within normal limits -Keep potassium ~4, magnesium above 2  Transudative pleural effusions, pneumonia, shortness of breath -Patient had complained of dyspnea on 7/24, intermittent, noticed to be 8.9 L positive during hospitalization.  Chest x-ray showed a right-sided pleural effusion with concerns for right basilar infiltrate -Ultrasound-guided thoracentesis done on 7/25 with 450 cc of clear fluid removed, transudative -Urine Legionella antigen negative, urine strep antigen negative -IV vancomycin, Zithromax has been discontinued -Continue IV cefepime for today, then finished course  Diarrhea -Likely secondary to tube feeds, C. difficile PCR negative -Improving  Hypertension -BP stable, continue  current decreased dose metoprolol 50 mg twice a day.  Amlodipine discontinued.  Code Status: Full CODE STATUS DVT Prophylaxis: Heparin subcu Family Communication: Discussed in detail with the patient, all imaging results, lab results explained to the patient    Disposition Plan: Needs skilled nursing facility, refaxed to other facilities as the previous bed offer by the skilled  nursing facility got rescinded due to acuity. Follow repeat COVID-19 test on 8/3    Time Spent in minutes 15 minutes  Procedures:   Percutaneous placement of gastrostomy tube 10/04/2018 per Dr. Ardelle Anton interventional radiology  Port-A-Cath removal per interventional radiology 10/14/2018  Ultrasound-guided thoracentesis 10/19/2018 per Brayton El, PA--- 450 cc of clear pale yellow fluid removed.  Consultants:    Oncology: Dr. Dion Body 09/30/2018  Interventional radiology: Dr. Ardelle Anton 10/02/2018  [facility]: Dr. Julian Reil 10/03/2018  General surgery: Dr. Luisa Hart 10/06/2018  Dental surgery: Dr. Valentino Hue 10/08/2018  Cardiology: Dr. Wyline Mood 10/13/2018  Infectious disease: Dr. Daiva Eves 10/13/2018  Radiation oncology    Antimicrobials:   Anti-infectives (From admission, onward)   Start     Dose/Rate Route Frequency Ordered Stop   10/19/18 0400  vancomycin (VANCOCIN) IVPB 750 mg/150 ml premix  Status:  Discontinued     750 mg 150 mL/hr over 60 Minutes Intravenous Every 12 hours 10/18/18 1541 10/20/18 1006   10/18/18 2000  ceFEPIme (MAXIPIME) 2 g in sodium chloride 0.9 % 100 mL IVPB     2 g 200 mL/hr over 30 Minutes Intravenous Every 8 hours 10/18/18 1243 10/30/18 0559   10/18/18 1600  azithromycin (ZITHROMAX) 500 mg in sodium chloride 0.9 % 250 mL IVPB     500 mg 250 mL/hr over 60 Minutes Intravenous Every 24 hours 10/18/18 1438 10/22/18 2252   10/18/18 1600  vancomycin (VANCOCIN) 1,250 mg in sodium chloride 0.9 % 250 mL IVPB     1,250 mg 166.7 mL/hr over 90 Minutes Intravenous  Once 10/18/18 1516 10/19/18 0100   10/15/18 1145  ceFEPIme (MAXIPIME) 2 g in sodium chloride 0.9 % 100 mL IVPB  Status:  Discontinued     2 g 200 mL/hr over 30 Minutes Intravenous Every 12 hours 10/15/18 1135 10/18/18 1243   10/14/18 1100  ceFEPIme (MAXIPIME) 2 g in sodium chloride 0.9 % 100 mL IVPB  Status:  Discontinued     2 g 200 mL/hr over 30 Minutes Intravenous Every 24 hours 10/13/18 1248 10/15/18  1135   10/14/18 0000  ceFAZolin (ANCEF) IVPB 2g/100 mL premix     2 g 200 mL/hr over 30 Minutes Intravenous To Radiology 10/13/18 1216 10/14/18 0907   10/12/18 0445  ceFEPIme (MAXIPIME) 2 g in sodium chloride 0.9 % 100 mL IVPB  Status:  Discontinued     2 g 200 mL/hr over 30 Minutes Intravenous Every 12 hours 10/12/18 0439 10/13/18 1248   10/11/18 1100  levofloxacin (LEVAQUIN) IVPB 750 mg  Status:  Discontinued     750 mg 100 mL/hr over 90 Minutes Intravenous Every 24 hours 10/11/18 0956 10/12/18 0439   10/04/18 1330  ceFAZolin (ANCEF) powder 1 g  Status:  Discontinued     1 g Other To Surgery 10/04/18 1317 10/04/18 1454   10/04/18 1315  ceFAZolin (ANCEF) 2-4 GM/100ML-% IVPB    Note to Pharmacy: Unk Lightning  : cabinet override      10/04/18 1315 10/04/18 1317   10/03/18 0800  ceFAZolin (ANCEF) IVPB 2g/100 mL premix     2 g 200 mL/hr over 30 Minutes Intravenous To Radiology 10/02/18  0901 10/04/18 1346   09/27/18 1500  clindamycin (CLEOCIN) IVPB 600 mg     600 mg 100 mL/hr over 30 Minutes Intravenous Every 8 hours 09/27/18 1400 09/28/18 0644         Medications  Scheduled Meds:  chlorhexidine  15 mL Mouth Rinse BID   doxazosin  1 mg Per Tube Daily   feeding supplement (PRO-STAT SUGAR FREE 64)  30 mL Per Tube BID   free water  200 mL Per Tube Q4H   heparin injection (subcutaneous)  5,000 Units Subcutaneous Q8H   hydrocortisone   Rectal QID   lidocaine  1 application Urethral Once   mouth rinse  15 mL Mouth Rinse q12n4p   metoprolol tartrate  50 mg Per Tube BID   pantoprazole sodium  40 mg Per Tube Daily   scopolamine  1 patch Transdermal Q72H   simethicone  160 mg Per Tube QID   simvastatin  20 mg Per Tube q1800   Continuous Infusions:  sodium chloride Stopped (10/27/18 1603)   ceFEPime (MAXIPIME) IV 2 g (10/29/18 1408)   Molli Posey Peptide 1.5 325 mL (10/28/18 2144)   lactated ringers 10 mL/hr at 10/07/18 0859   sodium chloride 500 mL/hr at  10/14/18 2049   PRN Meds:.sodium chloride, alum & mag hydroxide-simeth, hydrALAZINE, hydrocortisone, hydrocortisone cream, ipratropium-albuterol, lip balm, loperamide HCl, loratadine, metoprolol tartrate, morphine injection, Muscle Rub, ondansetron (ZOFRAN) IV, phenol, polyethylene glycol, polyvinyl alcohol, promethazine, senna-docusate, sodium chloride      Subjective:   Jonatham Rodeheaver was seen and examined today.  No fever chills, chest pain or any worsening shortness of breath.  No acute complaints.  BP stable.    Objective:   Vitals:   10/29/18 0447 10/29/18 0946 10/29/18 1100 10/29/18 1328  BP: (!) 120/58   (!) 104/52  Pulse: 89 91 75 85  Resp: 20 17 17    Temp: 98.6 F (37 C)   98.8 F (37.1 C)  TempSrc: Oral   Oral  SpO2: 100% 99% 96% 97%  Weight:      Height:        Intake/Output Summary (Last 24 hours) at 10/29/2018 1424 Last data filed at 10/29/2018 1332 Gross per 24 hour  Intake 702 ml  Output 1275 ml  Net -573 ml     Wt Readings from Last 3 Encounters:  10/27/18 58.5 kg  09/11/18 63.2 kg  04/25/18 70.4 kg    Physical Exam  General: Alert and oriented x 3, NAD  Eyes:   HEENT: trach +  Cardiovascular: S1 S2 clear, no murmurs, RRR. No pedal edema b/l  Respiratory: CTAB, no wheezing, rales or rhonchi  Gastrointestinal: Soft, PEG +  Ext: no pedal edema bilaterally  Neuro: no new deficits  Musculoskeletal: No cyanosis, clubbing  Skin: No rashes  Psych: Normal affect and demeanor, alert and oriented x3      Data Reviewed:  I have personally reviewed following labs and imaging studies  Micro Results Recent Results (from the past 240 hour(s))  Culture, body fluid-bottle     Status: None   Collection Time: 10/19/18  3:20 PM   Specimen: Pleura  Result Value Ref Range Status   Specimen Description PLEURAL  Final   Special Requests NONE  Final   Culture   Final    NO GROWTH 5 DAYS Performed at [facility], [address]  [place], [place] [address]    Report Status 10/24/2018 FINAL  Final  Gram stain  Status: None   Collection Time: 10/19/18  3:20 PM   Specimen: Pleura  Result Value Ref Range Status   Specimen Description PLEURAL  Final   Special Requests NONE  Final   Gram Stain   Final    RARE WBC PRESENT, PREDOMINANTLY PMN NO ORGANISMS SEEN Performed at [facility], [address], [place], [place] [address]    Report Status 10/20/2018 FINAL  Final    Radiology Reports Ct Abdomen Wo Contrast  Result Date: 10/01/2018 CLINICAL DATA:  73 year old male undergoing evaluation for gastrostomy tube placement. EXAM: CT ABDOMEN WITHOUT CONTRAST TECHNIQUE: Multidetector CT imaging of the abdomen was performed following the standard protocol without IV contrast. COMPARISON:  Prior abdominal ultrasound 12/25/2016 FINDINGS: Lower chest: Mild lower lobe bronchial wall thickening. Trace atelectasis versus infiltrate in the dependent portion of the right lower lobe. No suspicious pulmonary nodule or mass. The visualized heart is normal in size. Unremarkable distal thoracic esophagus. Hepatobiliary: Normal hepatic contour and morphology. No discrete hepatic lesion. The gallbladder lumen is filled with high density material likely representing sludge and/or small stones. No biliary ductal dilatation. Pancreas: Unremarkable. No pancreatic ductal dilatation or surrounding inflammatory changes. Spleen: Normal in size without focal abnormality. Adrenals/Urinary Tract: Normal adrenal glands. Circumscribed low-attenuation lesions within the right kidney are incompletely characterized in the absence of intravenous contrast. However, a sonographically simple cyst was previously identified in the lower pole. No evidence of hydronephrosis or nephrolithiasis. Stomach/Bowel: High-riding transverse colon which lies anterior to the gastric body and antrum. Otherwise, normal gastric anatomy. No focal bowel wall thickening or evidence of  obstruction. Vascular/Lymphatic: Limited evaluation in the absence of intravenous contrast. Extensive atherosclerotic vascular calcifications. No aneurysm. Other: No ascites or abdominal wall hernia. Musculoskeletal: No acute fracture or aggressive appearing lytic or blastic osseous lesion. IMPRESSION: 1. High-riding transverse colon which lies anterior to the gastric body and antrum. Percutaneous gastrostomy tube placement may be possible, however will require good visualization of the transverse colon at the time of attempted procedure. Consider administration of barium prior to the procedure. 2.  Aortic Atherosclerosis (ICD10-170.0). 3. Right lower lobe atelectasis versus changes of small volume aspiration. 4. Sludge and/or small stones in the gallbladder lumen. Electronically Signed   By: Malachy Moan M.D.   On: 10/01/2018 14:18   Dg Orthopantogram  Result Date: 10/08/2018 CLINICAL DATA:  Poor dentition. Preoperative for laryngeal carcinoma. EXAM: ORTHOPANTOGRAM/PANORAMIC COMPARISON:  None. FINDINGS: Missing LEFT maxillary teeth from a first bicuspid through distal molar. Missing RIGHT maxillary teeth from second bicuspid through distal molar. Missing RIGHT mandibular teeth from first through distal molar. Missing LEFT mandibular first molar. No periapical lucency to suggest abscess. No definite caries. IMPRESSION: 1. Numerous missing mandibular and maxillary teeth, as detailed above. 2. No evidence of periapical dental abscess. Electronically Signed   By: Bary Richard M.D.   On: 10/08/2018 13:27   Dg Chest 1 View  Result Date: 10/19/2018 CLINICAL DATA:  S/P right thoracentesis. EXAM: CHEST  1 VIEW COMPARISON:  10/18/2018 FINDINGS: Tracheostomy tube is unchanged. The heart size is normal. Small RIGHT pleural effusion, decreased since prior study. No pneumothorax. Minimal bibasilar atelectasis. IMPRESSION: Decreased RIGHT pleural effusion. No pneumothorax. Electronically Signed   By: Norva Pavlov M.D.   On: 10/19/2018 15:43   Dg Abd 1 View  Result Date: 10/05/2018 CLINICAL DATA:  Is a gastric tube placement. EXAM: ABDOMEN - 1 VIEW COMPARISON:  October 04, 2018 FINDINGS: Nonobstructive bowel gas pattern. Residual contrast throughout the colon and  in the appendix. Nonobstructive bowel gas pattern. Enteric catheter overlies the expected location of the gastric cardia with side hole at the expected location of the GE junction. IMPRESSION: Enteric catheter overlies the expected location of the gastric cardia with side hole at the expected location of the GE junction. Electronically Signed   By: Ted Mcalpine M.D.   On: 10/05/2018 15:55   Dg Abd 1 View  Result Date: 10/02/2018 CLINICAL DATA:  Enteric tube placement. EXAM: ABDOMEN - 1 VIEW COMPARISON:  CT abdomen from yesterday. FINDINGS: Enteric tube tip in the stomach with the proximal side port in the lower esophagus. Nonobstructive bowel gas pattern. No acute osseous abnormality. IMPRESSION: 1. Enteric tube tip in the stomach with proximal side port in the lower esophagus. Recommend advancing 7-8 cm. Electronically Signed   By: Obie Dredge M.D.   On: 10/02/2018 18:59   Ir Gastrostomy Tube Mod Sed  Result Date: 10/04/2018 INDICATION: 73 year old male with dysphagia EXAM: PERC PLACEMENT GASTROSTOMY MEDICATIONS: 2 g Ancef ANESTHESIA/SEDATION: Versed 0.5 mg IV; Fentanyl 25 mcg IV Moderate Sedation Time:  0 The patient was continuously monitored during the procedure by the interventional radiology nurse under my direct supervision. CONTRAST:  None FLUOROSCOPY TIME:  Fluoroscopy Time: 0 minutes 30 seconds (4 mGy). COMPLICATIONS: None immediate. PROCEDURE: Informed written consent was obtained from the patient's family after a thorough discussion of the procedural risks, benefits and alternatives. All questions were addressed. Maximal Sterile Barrier Technique was utilized including caps, mask, sterile gowns, sterile gloves, sterile drape, hand  hygiene and skin antiseptic. A timeout was performed prior to the initiation of the procedure. The epigastrium was prepped with Betadine in a sterile fashion, and a sterile drape was applied covering the operative field. A sterile gown and sterile gloves were used for the procedure. A 5-French orogastric tube is placed under fluoroscopic guidance. Scout imaging of the abdomen confirms barium within the transverse colon. The stomach was distended with gas. Multiple x-ray performed with insufflation of the stomach. The partially barium filled colon did not migrate caudally with insufflation of the stomach. X-rays were performed. We withdrew from the procedure given the anatomy. Patient tolerated the procedure well and remained hemodynamically stable throughout. No complications were encountered and no significant blood loss encountered. IMPRESSION: Attempt at percutaneous gastrostomy demonstrates insufficient anatomy for image guided placement. The transverse colon did not migrate caudally with insufflation of the stomach. Signed, Yvone Neu. Loreta Ave, DO [facility] Electronically Signed   By: Gilmer Mor D.O.   On: 10/04/2018 14:25   Ir Removal [employer] W/ Angustura W/o Fl Mod Sed  Result Date: 10/14/2018 CLINICAL DATA:  History of head and neck squamous cell carcinoma. Status post right upper chest subcutaneous Port-A-Cath placement on 10/04/2018 with catheter access via the right internal jugular vein. Development Pseudomonas bacteremia now requiring Port-A-Cath removal due to suspected catheter seeding/source for infection. EXAM: REMOVAL OF IMPLANTED TUNNELED PORT-A-CATH MEDICATIONS: None PROCEDURE: The right chest Port-A-Cath site was prepped with chlorhexidine. A sterile gown and gloves were worn during the procedure. Local anesthesia was provided with 1% lidocaine. An incision was made overlying the Port-A-Cath with a #15 scalpel. Utilizing sharp and  blunt dissection, the Port-A-Cath was removed. The pocked was debrided with gauze soaked in sterile saline. Iodoform gauze was then packed into the wound. A dressing was then applied over the iodoform gauze. FINDINGS: After making an incision, the port pocket was inspected and demonstrates bloody fluid which is mildly turbulent and  felt to be likely infected. Tissue was also indurated and therefore the pocket was left open to heal secondarily. After debridement, iodoform gauze was packed into the port wound. The entire Port-A-Cath and attached catheter were successfully removed. IMPRESSION: Removal of implanted Port-A-Cath utilizing sharp and blunt dissection. The port pocket demonstrates what appears to be likely infected hematoma. After debridement of bloody fluid and clot, the wound was packed with iodoform gauze. Electronically Signed   By: Irish Lack M.D.   On: 10/14/2018 14:34   Dg Chest Port 1 View  Result Date: 10/18/2018 CLINICAL DATA:  Shortness of breath. EXAM: PORTABLE CHEST 1 VIEW COMPARISON:  10/11/2018. FINDINGS: Interval removal of PowerPort catheter. Tracheostomy tube noted in stable position. Heart size normal. Right base infiltrate with right-sided pleural effusion. No pneumothorax. IMPRESSION: 1. Interval removal of PowerPort catheter. Tracheostomy tube noted with tip in stable position. 2.  Right base infiltrate with right-sided pleural effusion. Electronically Signed   By: Maisie Fus  Register   On: 10/18/2018 13:35   Dg Chest Port 1 View  Result Date: 10/11/2018 CLINICAL DATA:  Nausea and vomiting yesterday.  New tracheostomy. EXAM: PORTABLE CHEST 1 VIEW COMPARISON:  Chest CT 09/29/2018 FINDINGS: Tracheostomy tube appears in adequate position. Right IJ Port-A-Cath has tip over the SVC. Lungs are adequately inflated without focal airspace consolidation or effusion. No pneumothorax. Cardiomediastinal silhouette is within normal. Dense radiopaque material/calcifications over the  posteromedial right lower lobe as seen on CT scan. Remainder of the exam is unchanged. IMPRESSION: No acute cardiopulmonary disease. Tracheostomy tube and right IJ Port-A-Cath in adequate position. Electronically Signed   By: Elberta Fortis M.D.   On: 10/11/2018 15:40   Dg Abd Portable 1v  Result Date: 10/11/2018 CLINICAL DATA:  New tracheostomy tube. Nausea and vomiting yesterday. EXAM: PORTABLE ABDOMEN - 1 VIEW COMPARISON:  10/05/2018 FINDINGS: Percutaneous gastrostomy tube projects over the stomach in the left upper quadrant. Bowel gas pattern is nonobstructive. No free peritoneal air. Remainder of the exam is unchanged. IMPRESSION: Nonobstructive bowel gas pattern. Gastrostomy tube projects over the stomach in the left upper quadrant. Electronically Signed   By: Elberta Fortis M.D.   On: 10/11/2018 15:41   Dg Abd Portable 1v  Result Date: 10/03/2018 CLINICAL DATA:  Dysphagia. EXAM: PORTABLE ABDOMEN - 1 VIEW COMPARISON:  Radiograph of October 02, 2018. FINDINGS: The bowel gas pattern is normal. Residual contrast is noted throughout the colon. No radio-opaque calculi or other significant radiographic abnormality are seen. IMPRESSION: No evidence of bowel obstruction or ileus. Electronically Signed   By: Lupita Raider M.D.   On: 10/03/2018 07:52   Dg Abd Portable 1v  Result Date: 10/02/2018 CLINICAL DATA:  Nasogastric tube advancement. EXAM: PORTABLE ABDOMEN - 1 VIEW COMPARISON:  10/02/2018 at 1839 hours FINDINGS: The nasogastric tube has been advanced. Tip now extends further into the stomach with the side hole entering the proximal stomach. Normal bowel gas pattern. IMPRESSION: 1. Advanced nasogastric tube, now fully within the stomach. Electronically Signed   By: Amie Portland M.D.   On: 10/02/2018 20:45   Dg Swallowing Func-speech Pathology  Result Date: 10/29/2018 Objective Swallowing Evaluation: Type of Study: MBS-Modified Barium Swallow Study  Patient Details Name: SPIRO VARGAZ MRN: [id number] Date  of Birth: Feb 24, 1946 Today's Date: 10/29/2018 Time: SLP Start Time (ACUTE ONLY): 9629 -SLP Stop Time (ACUTE ONLY): 0901 SLP Time Calculation (min) (ACUTE ONLY): 26 min Past Medical History: Past Medical History: Diagnosis Date  Diverticulitis   False positive serological test for hepatitis  C 12/13/2016  GERD (gastroesophageal reflux disease)   Hepatitis C   HTN (hypertension)   Hyperlipidemia  Past Surgical History: Past Surgical History: Procedure Laterality Date  BIOPSY  05/04/2015  Procedure: BIOPSY;  Surgeon: West Bali, MD;  [facility]: [facility];  Service: Endoscopy;;  bx's of ileocecal valve   COLONOSCOPY WITH PROPOFOL N/A 05/04/2015  Dr. Darrick Penna: normal appearing ileum with prominent IC valve with tubular adenomas, moderate diverticulosis in sigmoid colon, ascending colon, and retum. Moderate sized internal hemorrhoids. Surveillance in 5 years  ESOPHAGOGASTRODUODENOSCOPY (EGD) WITH PROPOFOL N/A 12/19/2016  Procedure: ESOPHAGOGASTRODUODENOSCOPY (EGD) WITH PROPOFOL;  Surgeon: West Bali, MD;  [facility]: [facility];  Service: Endoscopy;  Laterality: N/A;  11:30am  FLEXIBLE SIGMOIDOSCOPY N/A 12/10/2015  hemorrhoid banding X 3   HEMORRHOID BANDING N/A 12/10/2015  Procedure: HEMORRHOID BANDING;  Surgeon: West Bali, MD;  Location: [facility];  Service: Endoscopy;  Laterality: N/A;  1:30 PM  IR GASTROSTOMY TUBE MOD SED  10/04/2018  IR IMAGING GUIDED PORT INSERTION  10/04/2018  IR REMOVAL TUN ACCESS W/ PORT W/O FL MOD SED  10/14/2018  LAPAROSCOPIC INSERTION GASTROSTOMY TUBE Left 10/07/2018  Procedure: LAPAROSCOPIC  GASTROSTOMY TUBE;  Surgeon: Rodman Pickle, MD;  Location: [facility] OR;  Service: General;  Laterality: Left;  MICROLARYNGOSCOPY N/A 09/27/2018  Procedure: MICRO DIRECT LARYNGOSCOPY WITH BIOPSY;  Surgeon: Newman Pies, MD;  [facility];  Service: ENT;  Laterality: N/A;  None to date    As of 04/14/15  POLYPECTOMY  05/04/2015  Procedure: POLYPECTOMY;  Surgeon: West Bali, MD;   [facility]: [facility];  Service: Endoscopy;;  descending colon polyp, ascending colon polyp  SAVORY DILATION N/A 12/19/2016  Procedure: SAVORY DILATION;  Surgeon: West Bali, MD;  [facility]: [facility];  Service: Endoscopy;  Laterality: N/A;  TRACHEOSTOMY TUBE PLACEMENT N/A 09/27/2018  Procedure: AWAKE TRACHEOSTOMY;  Surgeon: Newman Pies, MD;  Location: [facility] OR;  Service: ENT;  Laterality: N/A; HPI: Pt is a 73 yo male admitted for planned trach and laryngeal biopsy 7/2. Biopsy results pending but Neck CT shows extensive laryngeal, pharyngeal, esophageal mass- diagnosed as cancer.  Pt is s/p trach and PEG tube.  He is undergoing radiation tx started 10/22/2018.   Esophagram 6/26 showed silent gross aspiration of barium. PMH includes: HLD, HTN, hepatitis C, GERD, diverticulitis.  Pt has been followed by SLP for dysphagia and pmsv.  He has previously declined to consume po intake but has been using his pmsv and conducting two swallowing exercises (lingual press and effortful swallow).  Subjective: pt awake in chair Assessment / Plan / Recommendation CHL IP CLINICAL IMPRESSIONS 10/29/2018 Clinical Impression Patient presents with severe pharyngeal dysphagia characterized by decreased tongue base retraction, laryngeal elevation/closure resulting in laryngeal penetration and trace aspiration (silent). Pt  conducts multiple swallows - piecemealing with each bolus; self-created compensation strategy likely.  Trace aspiration cleared with cued cough/expectoration.  Pt was tested with PMSV on only.  SLP did not tax pt - only had him consume very small boluses to keep him from aspirating.  Recommend pt have ice chips and water independently; trials of other po (thin, nectar, puree) with SLP.  Using live monitor, pt educated to findings/compensation strategies with teach back.  He will need SLP at next venue of care to continue swallowing management.  Due to sensori-motor deficit, he will need repeat MBS prior to transitioning  into po. SLP Visit Diagnosis Aphonia (R49.1);Dysphagia, unspecified (R13.10) Attention and concentration deficit following -- Frontal lobe  and executive function deficit following -- Impact on safety and function Risk for inadequate nutrition/hydration;Moderate aspiration risk   CHL IP TREATMENT RECOMMENDATION 10/29/2018 Treatment Recommendations Therapy as outlined in treatment plan below   Prognosis 10/29/2018 Prognosis for Safe Diet Advancement Good Barriers to Reach Goals Other (Comment) Barriers/Prognosis Comment -- CHL IP DIET RECOMMENDATION 10/29/2018 SLP Diet Recommendations NPO;Ice chips PRN after oral care;Free water protocol after oral care Liquid Administration via Cup;Spoon Medication Administration Via alternative means Compensations -- Postural Changes --   CHL IP OTHER RECOMMENDATIONS 10/29/2018 Recommended Consults -- Oral Care Recommendations Oral care QID Other Recommendations Have oral suction available   CHL IP FOLLOW UP RECOMMENDATIONS 10/29/2018 Follow up Recommendations Skilled Nursing facility   [facility] IP FREQUENCY AND DURATION 10/29/2018 Speech Therapy Frequency (ACUTE ONLY) min 2x/week Treatment Duration 2 weeks      CHL IP ORAL PHASE 10/29/2018 Oral Phase WFL Oral - Pudding Teaspoon -- Oral - Pudding Cup -- Oral - Honey Teaspoon Piecemeal swallowing Oral - Honey Cup -- Oral - Nectar Teaspoon Piecemeal swallowing Oral - Nectar Cup Piecemeal swallowing Oral - Nectar Straw -- Oral - Thin Teaspoon Piecemeal swallowing Oral - Thin Cup Piecemeal swallowing Oral - Thin Straw -- Oral - Puree Piecemeal swallowing Oral - Mech Soft -- Oral - Regular -- Oral - Multi-Consistency -- Oral - Pill -- Oral Phase - Comment --  CHL IP PHARYNGEAL PHASE 10/29/2018 Pharyngeal Phase Impaired Pharyngeal- Pudding Teaspoon -- Pharyngeal -- Pharyngeal- Pudding Cup -- Pharyngeal -- Pharyngeal- Honey Teaspoon Pharyngeal residue - pyriform;Reduced laryngeal elevation;Reduced airway/laryngeal closure;Reduced epiglottic inversion;Reduced  anterior laryngeal mobility;Reduced tongue base retraction Pharyngeal Material enters airway, remains ABOVE vocal cords and not ejected out Pharyngeal- Honey Cup -- Pharyngeal -- Pharyngeal- Nectar Teaspoon Reduced tongue base retraction;Pharyngeal residue - pyriform;Reduced epiglottic inversion;Reduced anterior laryngeal mobility;Reduced laryngeal elevation;Reduced airway/laryngeal closure Pharyngeal Material enters airway, remains ABOVE vocal cords and not ejected out Pharyngeal- Nectar Cup Reduced epiglottic inversion;Reduced anterior laryngeal mobility;Reduced laryngeal elevation;Reduced airway/laryngeal closure;Reduced tongue base retraction;Trace aspiration;Penetration/Aspiration during swallow;Penetration/Apiration after swallow Pharyngeal Material enters airway, remains ABOVE vocal cords and not ejected out;Material enters airway, CONTACTS cords and not ejected out;Material enters airway, passes BELOW cords without attempt by patient to eject out (silent aspiration) Pharyngeal- Nectar Straw -- Pharyngeal -- Pharyngeal- Thin Teaspoon Reduced epiglottic inversion;Reduced anterior laryngeal mobility;Reduced laryngeal elevation;Reduced airway/laryngeal closure;Reduced tongue base retraction;Penetration/Aspiration during swallow;Penetration/Apiration after swallow Pharyngeal Material enters airway, remains ABOVE vocal cords and not ejected out;Material enters airway, CONTACTS cords and then ejected out Pharyngeal- Thin Cup Reduced epiglottic inversion;Reduced anterior laryngeal mobility;Reduced laryngeal elevation;Reduced airway/laryngeal closure;Reduced tongue base retraction;Penetration/Aspiration during swallow;Penetration/Apiration after swallow Pharyngeal Material enters airway, remains ABOVE vocal cords and not ejected out;Material enters airway, passes BELOW cords without attempt by patient to eject out (silent aspiration);Material enters airway, CONTACTS cords and then ejected out Pharyngeal- Thin Straw  -- Pharyngeal -- Pharyngeal- Puree Reduced epiglottic inversion;Reduced anterior laryngeal mobility;Reduced laryngeal elevation;Reduced airway/laryngeal closure;Reduced tongue base retraction;Pharyngeal residue - valleculae;Pharyngeal residue - pyriform Pharyngeal Material does not enter airway Pharyngeal- Mechanical Soft -- Pharyngeal -- Pharyngeal- Regular -- Pharyngeal -- Pharyngeal- Multi-consistency -- Pharyngeal -- Pharyngeal- Pill -- Pharyngeal -- Pharyngeal Comment pt conducts multiple swallows per bolus; piecemeals - which is likely self created compensatory strategy as he denies sensation of residuals; cued cough/"hock" effective to remove penetrates and trace aspirates  CHL IP CERVICAL ESOPHAGEAL PHASE 10/29/2018 Cervical Esophageal Phase Impaired Pudding Teaspoon -- Pudding Cup -- Honey Teaspoon -- Honey Cup -- Nectar Teaspoon -- Nectar Cup -- Nectar Straw -- Thin  Teaspoon -- Thin Cup -- Thin Straw -- Puree -- Mechanical Soft -- Regular -- Multi-consistency -- Pill -- Cervical Esophageal Comment decreased UES opening Chales Abrahams 10/29/2018, 9:47 AM Donavan Burnet, MS [facility] SLP Acute Rehab Services Pager [phone] Office [phone]              Ir Imaging Guided Port Insertion  Result Date: 10/04/2018 INDICATION: 73 year old male with a history squamous cell carcinoma and dysphagia EXAM: IMPLANTED PORT A CATH PLACEMENT WITH ULTRASOUND AND FLUOROSCOPIC GUIDANCE MEDICATIONS: 2 g Ancef; The antibiotic was administered within an appropriate time interval prior to skin puncture. ANESTHESIA/SEDATION: Moderate (conscious) sedation was employed during this procedure. A total of Versed 1.0 mg and Fentanyl 25 mcg was administered intravenously. Moderate Sedation Time: 19 minutes. The patient's level of consciousness and vital signs were monitored continuously by radiology nursing throughout the procedure under my direct supervision. FLUOROSCOPY TIME:  0 minutes, 6 seconds (1 mGy) COMPLICATIONS: None  PROCEDURE: The procedure, risks, benefits, and alternatives were explained to the patient. Questions regarding the procedure were encouraged and answered. The patient understands and consents to the procedure. Ultrasound survey was performed with images stored and sent to PACs. The right neck and chest was prepped with chlorhexidine, and draped in the usual sterile fashion using maximum barrier technique (cap and mask, sterile gown, sterile gloves, large sterile sheet, hand hygiene and cutaneous antiseptic). Antibiotic prophylaxis was provided with 2.0g Ancef administered IV one hour prior to skin incision. Local anesthesia was attained by infiltration with 1% lidocaine without epinephrine. Ultrasound demonstrated patency of the right internal jugular vein, and this was documented with an image. Under real-time ultrasound guidance, this vein was accessed with a 21 gauge micropuncture needle and image documentation was performed. A small dermatotomy was made at the access site with an 11 scalpel. A 0.018" wire was advanced into the SVC and used to estimate the length of the internal catheter. The access needle exchanged for a 1F micropuncture vascular sheath. The 0.018" wire was then removed and a 0.035" wire advanced into the IVC. An appropriate location for the subcutaneous reservoir was selected below the clavicle and an incision was made through the skin and underlying soft tissues. The subcutaneous tissues were then dissected using a combination of blunt and sharp surgical technique and a pocket was formed. A single lumen power injectable portacatheter was then tunneled through the subcutaneous tissues from the pocket to the dermatotomy and the port reservoir placed within the subcutaneous pocket. The venous access site was then serially dilated and a peel away vascular sheath placed over the wire. The wire was removed and the port catheter advanced into position under fluoroscopic guidance. The catheter tip is  positioned in the cavoatrial junction. This was documented with a spot image. The portacatheter was then tested and found to flush and aspirate well. The port was flushed with saline followed by 100 units/mL heparinized saline. The pocket was then closed in two layers using first subdermal inverted interrupted absorbable sutures followed by a running subcuticular suture. The epidermis was then sealed with Dermabond. The dermatotomy at the venous access site was also seal with Dermabond. Patient tolerated the procedure well and remained hemodynamically stable throughout. No complications encountered and no significant blood loss encountered IMPRESSION: Status post port catheter placement.  Catheter ready for use. Signed, Yvone Neu. Reyne Dumas, RPVI [facility] Radiology Electronically Signed   By: Gilmer Mor D.O.   On: 10/04/2018 14:21   US Thoracentesis  Asp Pleural Space W/img Guide  Result Date: 10/19/2018 INDICATION: Shortness of breath. Right-sided pleural effusion. Request for diagnostic and therapeutic thoracentesis. EXAM: ULTRASOUND GUIDED RIGHT THORACENTESIS MEDICATIONS: None. COMPLICATIONS: None immediate. PROCEDURE: An ultrasound guided thoracentesis was thoroughly discussed with the patient and questions answered. The benefits, risks, alternatives and complications were also discussed. The patient understands and wishes to proceed with the procedure. Written consent was obtained. Ultrasound was performed to localize and mark an adequate pocket of fluid in the right chest. The area was then prepped and draped in the normal sterile fashion. 1% Lidocaine was used for local anesthesia. Under ultrasound guidance a 6 Fr Safe-T-Centesis catheter was introduced. Thoracentesis was performed. The catheter was removed and a dressing applied. FINDINGS: A total of approximately 450 mL of clear, pale yellow fluid was removed. Samples were sent to the laboratory as  requested by the clinical team. IMPRESSION: Successful ultrasound guided right thoracentesis yielding 450 mL of pleural fluid. Read by: Brayton El PA-C Electronically Signed   By: Simonne Come M.D.   On: 10/19/2018 15:29    Lab Data:  CBC: Recent Labs  Lab 10/23/18 0438 10/24/18 0457 10/27/18 0538  WBC 10.9* 9.7 7.8  HGB 9.0* 9.7* 10.3*  HCT 30.4* 31.2* 34.1*  MCV 93.0 91.8 91.2  PLT 328 294 343   Basic Metabolic Panel: Recent Labs  Lab 10/24/18 0457 10/25/18 0438 10/27/18 0538 10/28/18 0435 10/29/18 0435  NA 138 138 137 137 138  K 4.3 4.3 4.3 4.2 4.3  CL 100 101 101 102 102  CO2 29 30 27 25 28   GLUCOSE 104* 108* 117* 117* 119*  BUN 15 13 17 19 20   CREATININE 0.74 0.66 0.66 0.73 0.71  CALCIUM 8.8* 9.1 8.8* 8.6* 8.9  MG  --  2.0  --   --   --   PHOS  --  4.4  --   --   --    GFR: Estimated Creatinine Clearance: 70.1 mL/min (by C-G formula based on SCr of 0.71 mg/dL). Liver Function Tests: Recent Labs  Lab 10/25/18 0438  AST 21  ALT 33  ALKPHOS 72  BILITOT 0.5  PROT 6.3*  ALBUMIN 2.5*   No results for input(s): LIPASE, AMYLASE in the last 168 hours. No results for input(s): AMMONIA in the last 168 hours. Coagulation Profile: No results for input(s): INR, PROTIME in the last 168 hours. Cardiac Enzymes: No results for input(s): CKTOTAL, CKMB, CKMBINDEX, TROPONINI in the last 168 hours. BNP (last 3 results) No results for input(s): PROBNP in the last 8760 hours. HbA1C: No results for input(s): HGBA1C in the last 72 hours. CBG: Recent Labs  Lab 10/28/18 2106 10/29/18 0018 10/29/18 0444 10/29/18 0753 10/29/18 1331  GLUCAP 92 103* 116* 105* 111*   Lipid Profile: No results for input(s): CHOL, HDL, LDLCALC, TRIG, CHOLHDL, LDLDIRECT in the last 72 hours. Thyroid Function Tests: No results for input(s): TSH, T4TOTAL, FREET4, T3FREE, THYROIDAB in the last 72 hours. Anemia Panel: No results for input(s): VITAMINB12, FOLATE, FERRITIN, TIBC, IRON,  RETICCTPCT in the last 72 hours. Urine analysis:    Component Value Date/Time   COLORURINE YELLOW 03/30/2015 1330   APPEARANCEUR CLEAR 03/30/2015 1330   LABSPEC >1.030 (H) 03/30/2015 1330   PHURINE 5.5 03/30/2015 1330   GLUCOSEU NEGATIVE 03/30/2015 1330   HGBUR TRACE (A) 03/30/2015 1330   BILIRUBINUR NEGATIVE 03/30/2015 1330   KETONESUR NEGATIVE 03/30/2015 1330   PROTEINUR NEGATIVE 03/30/2015 1330   NITRITE NEGATIVE 03/30/2015 1330   LEUKOCYTESUR NEGATIVE  03/30/2015 1330     Jerlisa Diliberto M.D. [facility] 10/29/2018, 2:24 PM  Pager: [phone] Between 7am to 7pm - call Pager - [phone]  After 7pm go to [email or web address] - password [email or web address]  Call night coverage person covering after 7pm

## 2018-10-29 NOTE — TOC Progression Note (Signed)
Transition of Care [facility]) - Progression Note    Patient Details  Name: Logan Price MRN: [id number] Date of Birth: [birth year]  Transition of Care [facility]) CM/SW Contact  Joaquin Courts, RN Phone Number: 10/29/2018, 3:53 PM  Clinical Narrative:  CM presented patient and brother with bed offer from [facility]. Patient and brother accept. CM reached out to [facility] to notify facility rep of bed acceptance. [facility] states may have a bed available tomorrow, but will need to follow-up.  Patient with a need for daily transport to radiation, currently week 2 of 7 week treatment, also has a trach, and peg tube with continuous feedings. Of note tube feeding formula is not on formulary with any of the facilities in the area and coordination with vendor is being handled by the dietician to ensure that facility receives shipment of the formula.      Expected Discharge Plan: [place] Barriers to Discharge: Continued Medical Work up  Expected Discharge Plan and Services Expected Discharge Plan: [place]   Discharge Planning Services: CM Consult Post Acute Care Choice: Home Health   Expected Discharge Date: 10/10/18               DME Arranged: Tube feeding, Tube feeding pump, Trach supplies DME Agency: [employer] Date DME Agency Contacted: 10/10/18 Time DME Agency Contacted: 1000 Representative spoke with at DME Agency: Thedore Mins HH Arranged: RN, PT, OT, Nurse's Aide, Social Work [employer] Agency: [place] ([place]) Date [place]: 10/03/18 Time [place]: 1152 Representative spoke with at [place]: [place] ([place]) Interventions    Readmission Risk Interventions Readmission Risk Prevention Plan 10/28/2018  Transportation Screening Complete  PCP or Specialist Appt within 3-5 Days Complete  HRI or [place] Not Complete  HRI or Home Care Consult comments dc to [facility]  Social Work  Consult for [place] Planning/Counseling Complete  Palliative Care Screening Not Applicable  Medication Review [employer]) Complete  Some recent data might be hidden

## 2018-10-29 NOTE — Progress Notes (Signed)
Occupational Therapy Treatment Patient Details Name: Logan Price MRN: [id number] DOB: 06-17-1945 Today's Date: 10/29/2018    History of present illness Pt is a 73 yo male admitted for planned trach and laryngeal biopsy 7/2. 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 Esophagram 6/26 showed silent gross aspiration of barium. PMH includes: HLD, HTN, hepatitis C, GERD, diverticulitis   OT comments  Goal added for HEP ( BUE ).    Follow Up Recommendations  SNF    Equipment Recommendations  None recommended by OT    Recommendations for Other Services      Precautions / Restrictions Precautions Precautions: Fall Precaution Comments: increased trach secretions; Gtube       Mobility Bed Mobility               General bed mobility comments: refused  Transfers                 General transfer comment: refused        ADL either performed or assessed with clinical judgement   ADL                                         General ADL Comments: pt agreed to BUE Exercise.  Pt would not sit EOB.  Only agreed to BUE exercise in supine position. Goal added     Vision Patient Visual Report: No change from baseline            Cognition Arousal/Alertness: Awake/alert Behavior During Therapy: WFL for tasks assessed/performed Overall Cognitive Status: Within Functional Limits for tasks assessed                                          Exercises Shoulder Exercises Shoulder Flexion: AROM;Both;20 reps Elbow Flexion: AROM;Both;20 reps;Supine Elbow Extension: AROM;20 reps;Supine;Both Wrist Flexion: Both;20 reps;Supine;AROM Wrist Extension: AROM;20 reps;Supine;Both Digit Composite Flexion: AROM;Both;20 reps;Supine Composite Extension: AROM;Supine;15 reps           Pertinent Vitals/ Pain       Pain Assessment: No/denies pain         Frequency  Min 2X/week         Progress Toward Goals  OT Goals(current goals can now be found in the care plan section)  Progress towards OT goals: OT to reassess next treatment(pt self limiting at times.)     Plan Discharge plan remains appropriate       AM-PAC OT "6 Clicks" Daily Activity     Outcome Measure   Help from another person eating meals?: A Little Help from another person taking care of personal grooming?: A Little Help from another person toileting, which includes using toliet, bedpan, or urinal?: A Little Help from another person bathing (including washing, rinsing, drying)?: A Little Help from another person to put on and taking off regular upper body clothing?: A Little Help from another person to put on and taking off regular lower body clothing?: A Little 6 Click Score: 18    End of Session    OT Visit Diagnosis: Unsteadiness on feet (R26.81);Muscle weakness (generalized) (M62.81)   Activity Tolerance Patient tolerated treatment well   Patient Left in bed;with call bell/phone within reach   Nurse Communication  Time: 1255-1306 OT Time Calculation (min): 11 min  Charges: OT General Charges $OT Visit: 1 Visit OT Treatments $Therapeutic Exercise: 8-22 mins  Logan Price, OT Acute Rehabilitation Services [phone] Office- [phone]      Logan Price, Logan Price 10/29/2018, 5:05 PM

## 2018-10-30 ENCOUNTER — Ambulatory Visit
Admit: 2018-10-30 | Discharge: 2018-10-30 | Disposition: A | Discharge status: Home / Self Care | Payer: Medicare Other | Attending: Radiation Oncology | Admitting: Radiation Oncology

## 2018-10-30 ENCOUNTER — Inpatient Hospital Stay (HOSPITAL_COMMUNITY): Payer: Medicare Other

## 2018-10-30 ENCOUNTER — Encounter (HOSPITAL_COMMUNITY): Payer: Self-pay | Admitting: Interventional Radiology

## 2018-10-30 HISTORY — PX: IR CM INJ ANY COLONIC TUBE W/FLUORO: IMG2336

## 2018-10-30 LAB — GLUCOSE, CAPILLARY
Glucose-Capillary: 85 mg/dL (ref 70–99)
Glucose-Capillary: 79 mg/dL (ref 70–99)
Glucose-Capillary: 76 mg/dL (ref 70–99)
Glucose-Capillary: 75 mg/dL (ref 70–99)
Glucose-Capillary: 97 mg/dL (ref 70–99)
Glucose-Capillary: 116 mg/dL — ABNORMAL HIGH (ref 70–99)

## 2018-10-30 LAB — NOVEL CORONAVIRUS, NAA (HOSP ORDER, SEND-OUT TO REF LAB; TAT 18-24 HRS): SARS-CoV-2, NAA: NOT DETECTED

## 2018-10-30 MED ORDER — IOHEXOL 300 MG/ML  SOLN
50.0000 mL | Freq: Once | INTRAMUSCULAR | Status: AC | PRN
  Administered 2018-10-30: 7 mL

## 2018-10-30 NOTE — Progress Notes (Signed)
Patient returned to floor from IR. Tube feed resumed. No concerns from pt. Will continue to monitor.

## 2018-10-30 NOTE — Progress Notes (Signed)
Patient transported off unit to IR for PEG placement.

## 2018-10-30 NOTE — Progress Notes (Signed)
Pt primary contact, wife Dedra Skeens, called this nurse and update given. Wife began discussing multiple family members visiting pt, and this nurse informed her of the specifics of the new visitor policy. 1 daughter has already visited pt, and wife stated another daughter is on her way from [place] to visit. This nurse again educated wife about new visitor policy. Wife stated she is the only individual allowed to visit patient. Will continue to monitor.

## 2018-10-30 NOTE — Progress Notes (Signed)
Pt transported off unit for radiation.

## 2018-10-30 NOTE — Progress Notes (Signed)
[facility]                                                                              Patient Demographics  Logan Price, is a 73 y.o. male, DOB - 1945/04/15, EXB:[id number]  Admit date - 09/27/2018   Admitting Physician [name], MD  Outpatient Primary MD for the patient is [name], FNP  Outpatient specialists:   LOS - 33  days   Medical records reviewed and are as summarized below:    No chief complaint on file.      Brief summary  73 year old with history of GERD, essential hypertension, hyperlipidemia who recently diagnosed neck mass concerning for malignancy was admitted to the hospital. Overnight patient went into SVT therefore medical team was consulted. Patient was given adenosine x1 which converted back to normal sinus rhythm. G-tube placed by surgery 7/13, tolerating tube feeds. Metoprolol was started via PEG tube. Hospitals course was later on complicated by Pseudomonas bacteremia secondary to infected Port-A-Cath. Patient was transferred to medicine service. Infectious disease team were consulted who recommended removing Port-A-Cath.    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  Trach care per ENT. -Per patient, on 7 weeks radiation cycles, finished IV cefepime on 10/29/2018 -Repeat COVID test on 8/3 for skilled nursing facility still pending   Active problems Dysphagia, PEG  tube dislodgment -Last evening, patient's PEG tube fell out, Foley placed in to keep the track open -IR consulted to replace PEG tube today -Restart tube feeds when cleared by IR to resume  Pseudomonas bacteremia -Likely source Port-A-Cath, removed on 10/14/2018. -ID recommended IV cefepime for total 2 weeks post Port-A-Cath removal, finished course on 10/29/2018  -Repeat blood cultures negative till date  Acute kidney injury secondary to acute urinary retention -Patient had over 1 L of urine in his bladder, creatinine 5.3 on 7/20, after placing Foley catheter, creatinine has been steadily improving -Creatinine stable 0.6, continue Foley catheter, had failed voiding trial and Foley catheter had to be placed back in -Patient will need urology outpatient follow-up for voiding trial and further evaluation.  Paroxysmal SVT, intermittent and recurrent -Patient had 25 beats run of nonsustained V. tach on 10/20/2018 a.m. -Patient was seen by cardiology, currently signed off.  2D echo normal, TSH within normal limits -Keep potassium ~4, magnesium above 2 -Continue metoprolol 50 mg twice a day  Transudative pleural effusions, pneumonia, shortness of breath -Patient had complained of dyspnea on 7/24, intermittent, noticed to be 8.9 L positive during hospitalization.  Chest x-ray showed a right-sided pleural effusion with concerns for right basilar infiltrate -Ultrasound-guided thoracentesis done on 7/25 with 450 cc of clear fluid removed, transudative -Urine Legionella  antigen negative, urine strep antigen negative -Patient was on IV vancomycin and Zithromax, discontinued, completed full course of IV cefepime for 2 weeks, on 8/4  Diarrhea -Likely secondary to tube feeds, C. difficile PCR negative -Improving  Hypertension -BP stable, continue current decreased dose metoprolol 50 mg twice a day.  Amlodipine discontinued.  Code Status: Full CODE STATUS DVT Prophylaxis: Heparin subcu Family  Communication: Discussed in detail with the patient, all imaging results, lab results explained to the patient    Disposition Plan: Needs skilled nursing facility, refaxed to other facilities as the previous bed offer by the skilled nursing facility got rescinded due to acuity. Follow repeat COVID-19 test on 8/3    Time Spent in minutes 15 minutes  Procedures:   Percutaneous placement of gastrostomy tube 10/04/2018 per Dr. Ardelle Anton interventional radiology  Port-A-Cath removal per interventional radiology 10/14/2018  Ultrasound-guided thoracentesis 10/19/2018 per Brayton El, PA--- 450 cc of clear pale yellow fluid removed.  Consultants:    Oncology: Dr. Dion Body 09/30/2018  Interventional radiology: Dr. Ardelle Anton 10/02/2018  [facility]: Dr. Julian Reil 10/03/2018  General surgery: Dr. Luisa Hart 10/06/2018  Dental surgery: Dr. Valentino Hue 10/08/2018  Cardiology: Dr. Wyline Mood 10/13/2018  Infectious disease: Dr. Daiva Eves 10/13/2018  Radiation oncology    Antimicrobials:   Anti-infectives (From admission, onward)   Start     Dose/Rate Route Frequency Ordered Stop   10/19/18 0400  vancomycin (VANCOCIN) IVPB 750 mg/150 ml premix  Status:  Discontinued     750 mg 150 mL/hr over 60 Minutes Intravenous Every 12 hours 10/18/18 1541 10/20/18 1006   10/18/18 2000  ceFEPIme (MAXIPIME) 2 g in sodium chloride 0.9 % 100 mL IVPB     2 g 200 mL/hr over 30 Minutes Intravenous Every 8 hours 10/18/18 1243 10/30/18 1015   10/18/18 1600  azithromycin (ZITHROMAX) 500 mg in sodium chloride 0.9 % 250 mL IVPB     500 mg 250 mL/hr over 60 Minutes Intravenous Every 24 hours 10/18/18 1438 10/22/18 2252   10/18/18 1600  vancomycin (VANCOCIN) 1,250 mg in sodium chloride 0.9 % 250 mL IVPB     1,250 mg 166.7 mL/hr over 90 Minutes Intravenous  Once 10/18/18 1516 10/19/18 0100   10/15/18 1145  ceFEPIme (MAXIPIME) 2 g in sodium chloride 0.9 % 100 mL IVPB  Status:  Discontinued     2 g 200 mL/hr over 30 Minutes  Intravenous Every 12 hours 10/15/18 1135 10/18/18 1243   10/14/18 1100  ceFEPIme (MAXIPIME) 2 g in sodium chloride 0.9 % 100 mL IVPB  Status:  Discontinued     2 g 200 mL/hr over 30 Minutes Intravenous Every 24 hours 10/13/18 1248 10/15/18 1135   10/14/18 0000  ceFAZolin (ANCEF) IVPB 2g/100 mL premix     2 g 200 mL/hr over 30 Minutes Intravenous To Radiology 10/13/18 1216 10/14/18 0907   10/12/18 0445  ceFEPIme (MAXIPIME) 2 g in sodium chloride 0.9 % 100 mL IVPB  Status:  Discontinued     2 g 200 mL/hr over 30 Minutes Intravenous Every 12 hours 10/12/18 0439 10/13/18 1248   10/11/18 1100  levofloxacin (LEVAQUIN) IVPB 750 mg  Status:  Discontinued     750 mg 100 mL/hr over 90 Minutes Intravenous Every 24 hours 10/11/18 0956 10/12/18 0439   10/04/18 1330  ceFAZolin (ANCEF) powder 1 g  Status:  Discontinued     1 g Other To Surgery 10/04/18 1317 10/04/18 1454   10/04/18 1315  ceFAZolin (ANCEF) 2-4 GM/100ML-% IVPB    Note to  Pharmacy: [employer]  : cabinet override      10/04/18 1315 10/04/18 1317   10/03/18 0800  ceFAZolin (ANCEF) IVPB 2g/100 mL premix     2 g 200 mL/hr over 30 Minutes Intravenous To Radiology 10/02/18 0901 10/04/18 1346   09/27/18 1500  clindamycin (CLEOCIN) IVPB 600 mg     600 mg 100 mL/hr over 30 Minutes Intravenous Every 8 hours 09/27/18 1400 09/28/18 0644         Medications  Scheduled Meds:  chlorhexidine  15 mL Mouth Rinse BID   doxazosin  1 mg Per Tube Daily   feeding supplement (PRO-STAT SUGAR FREE 64)  30 mL Per Tube BID   free water  200 mL Per Tube Q4H   heparin injection (subcutaneous)  5,000 Units Subcutaneous Q8H   hydrocortisone   Rectal QID   lidocaine  1 application Urethral Once   mouth rinse  15 mL Mouth Rinse q12n4p   metoprolol tartrate  50 mg Per Tube BID   pantoprazole sodium  40 mg Per Tube Daily   scopolamine  1 patch Transdermal Q72H   simethicone  160 mg Per Tube QID   simvastatin  20 mg Per Tube q1800    Continuous Infusions:  sodium chloride Stopped (10/27/18 1603)   Molli Posey Peptide 1.5 325 mL (10/28/18 2144)   lactated ringers 10 mL/hr at 10/07/18 0859   sodium chloride 500 mL/hr at 10/14/18 2049   PRN Meds:.sodium chloride, alum & mag hydroxide-simeth, hydrALAZINE, hydrocortisone, hydrocortisone cream, ipratropium-albuterol, lip balm, loperamide HCl, loratadine, metoprolol tartrate, morphine injection, Muscle Rub, ondansetron (ZOFRAN) IV, phenol, polyethylene glycol, polyvinyl alcohol, promethazine, senna-docusate, sodium chloride      Subjective:   Logan Price was seen and examined today.  PEG tube dislodged, fell out yesterday evening.  Overnight no acute issues.  BP stable.  No worsening shortness of breath or chest pain  Objective:   Vitals:   10/30/18 0600 10/30/18 0750 10/30/18 1225 10/30/18 1436  BP:    115/85  Pulse:    100  Resp:    (!) 22  Temp:    98.4 F (36.9 C)  TempSrc:    Oral  SpO2:  98% 98% 100%  Weight: 57.3 kg     Height:        Intake/Output Summary (Last 24 hours) at 10/30/2018 1548 Last data filed at 10/30/2018 1407 Gross per 24 hour  Intake 120 ml  Output 1250 ml  Net -1130 ml     Wt Readings from Last 3 Encounters:  10/30/18 57.3 kg  09/11/18 63.2 kg  04/25/18 70.4 kg   Physical Exam  General: Alert and oriented x 3, NAD  Eyes:   HEENT:  Trach +  Cardiovascular: S1 S2 clear, RRR. No pedal edema b/l  Respiratory: CTAB, no wheezing, rales or rhonchi  Gastrointestinal: Soft, G-tube dislodged, Foley cath in the track  Ext: no pedal edema bilaterally  Neuro: no new deficits  Musculoskeletal: No cyanosis, clubbing  Skin: No rashes  Psych: Normal affect and demeanor, alert and oriented x3       Data Reviewed:  I have personally reviewed following labs and imaging studies  Micro Results No results found for this or any previous visit (from the past 240 hour(s)).  Radiology Reports Ct Abdomen Wo  Contrast  Result Date: 10/01/2018 CLINICAL DATA:  73 year old male undergoing evaluation for gastrostomy tube placement. EXAM: CT ABDOMEN WITHOUT CONTRAST TECHNIQUE: Multidetector CT imaging of the abdomen was performed following the standard  protocol without IV contrast. COMPARISON:  Prior abdominal ultrasound 12/25/2016 FINDINGS: Lower chest: Mild lower lobe bronchial wall thickening. Trace atelectasis versus infiltrate in the dependent portion of the right lower lobe. No suspicious pulmonary nodule or mass. The visualized heart is normal in size. Unremarkable distal thoracic esophagus. Hepatobiliary: Normal hepatic contour and morphology. No discrete hepatic lesion. The gallbladder lumen is filled with high density material likely representing sludge and/or small stones. No biliary ductal dilatation. Pancreas: Unremarkable. No pancreatic ductal dilatation or surrounding inflammatory changes. Spleen: Normal in size without focal abnormality. Adrenals/Urinary Tract: Normal adrenal glands. Circumscribed low-attenuation lesions within the right kidney are incompletely characterized in the absence of intravenous contrast. However, a sonographically simple cyst was previously identified in the lower pole. No evidence of hydronephrosis or nephrolithiasis. Stomach/Bowel: High-riding transverse colon which lies anterior to the gastric body and antrum. Otherwise, normal gastric anatomy. No focal bowel wall thickening or evidence of obstruction. Vascular/Lymphatic: Limited evaluation in the absence of intravenous contrast. Extensive atherosclerotic vascular calcifications. No aneurysm. Other: No ascites or abdominal wall hernia. Musculoskeletal: No acute fracture or aggressive appearing lytic or blastic osseous lesion. IMPRESSION: 1. High-riding transverse colon which lies anterior to the gastric body and antrum. Percutaneous gastrostomy tube placement may be possible, however will require good visualization of the  transverse colon at the time of attempted procedure. Consider administration of barium prior to the procedure. 2.  Aortic Atherosclerosis (ICD10-170.0). 3. Right lower lobe atelectasis versus changes of small volume aspiration. 4. Sludge and/or small stones in the gallbladder lumen. Electronically Signed   By: Malachy Moan M.D.   On: 10/01/2018 14:18   Dg Orthopantogram  Result Date: 10/08/2018 CLINICAL DATA:  Poor dentition. Preoperative for laryngeal carcinoma. EXAM: ORTHOPANTOGRAM/PANORAMIC COMPARISON:  None. FINDINGS: Missing LEFT maxillary teeth from a first bicuspid through distal molar. Missing RIGHT maxillary teeth from second bicuspid through distal molar. Missing RIGHT mandibular teeth from first through distal molar. Missing LEFT mandibular first molar. No periapical lucency to suggest abscess. No definite caries. IMPRESSION: 1. Numerous missing mandibular and maxillary teeth, as detailed above. 2. No evidence of periapical dental abscess. Electronically Signed   By: Bary Richard M.D.   On: 10/08/2018 13:27   Dg Chest 1 View  Result Date: 10/19/2018 CLINICAL DATA:  S/P right thoracentesis. EXAM: CHEST  1 VIEW COMPARISON:  10/18/2018 FINDINGS: Tracheostomy tube is unchanged. The heart size is normal. Small RIGHT pleural effusion, decreased since prior study. No pneumothorax. Minimal bibasilar atelectasis. IMPRESSION: Decreased RIGHT pleural effusion. No pneumothorax. Electronically Signed   By: Norva Pavlov M.D.   On: 10/19/2018 15:43   Dg Abd 1 View  Result Date: 10/05/2018 CLINICAL DATA:  Is a gastric tube placement. EXAM: ABDOMEN - 1 VIEW COMPARISON:  October 04, 2018 FINDINGS: Nonobstructive bowel gas pattern. Residual contrast throughout the colon and in the appendix. Nonobstructive bowel gas pattern. Enteric catheter overlies the expected location of the gastric cardia with side hole at the expected location of the GE junction. IMPRESSION: Enteric catheter overlies the expected  location of the gastric cardia with side hole at the expected location of the GE junction. Electronically Signed   By: Ted Mcalpine M.D.   On: 10/05/2018 15:55   Dg Abd 1 View  Result Date: 10/02/2018 CLINICAL DATA:  Enteric tube placement. EXAM: ABDOMEN - 1 VIEW COMPARISON:  CT abdomen from yesterday. FINDINGS: Enteric tube tip in the stomach with the proximal side port in the lower esophagus. Nonobstructive bowel gas pattern. No acute osseous abnormality.  IMPRESSION: 1. Enteric tube tip in the stomach with proximal side port in the lower esophagus. Recommend advancing 7-8 cm. Electronically Signed   By: Obie Dredge M.D.   On: 10/02/2018 18:59   Ir Gastrostomy Tube Mod Sed  Result Date: 10/04/2018 INDICATION: 73 year old male with dysphagia EXAM: PERC PLACEMENT GASTROSTOMY MEDICATIONS: 2 g Ancef ANESTHESIA/SEDATION: Versed 0.5 mg IV; Fentanyl 25 mcg IV Moderate Sedation Time:  0 The patient was continuously monitored during the procedure by the interventional radiology nurse under my direct supervision. CONTRAST:  None FLUOROSCOPY TIME:  Fluoroscopy Time: 0 minutes 30 seconds (4 mGy). COMPLICATIONS: None immediate. PROCEDURE: Informed written consent was obtained from the patient's family after a thorough discussion of the procedural risks, benefits and alternatives. All questions were addressed. Maximal Sterile Barrier Technique was utilized including caps, mask, sterile gowns, sterile gloves, sterile drape, hand hygiene and skin antiseptic. A timeout was performed prior to the initiation of the procedure. The epigastrium was prepped with Betadine in a sterile fashion, and a sterile drape was applied covering the operative field. A sterile gown and sterile gloves were used for the procedure. A 5-French orogastric tube is placed under fluoroscopic guidance. Scout imaging of the abdomen confirms barium within the transverse colon. The stomach was distended with gas. Multiple x-ray performed with  insufflation of the stomach. The partially barium filled colon did not migrate caudally with insufflation of the stomach. X-rays were performed. We withdrew from the procedure given the anatomy. Patient tolerated the procedure well and remained hemodynamically stable throughout. No complications were encountered and no significant blood loss encountered. IMPRESSION: Attempt at percutaneous gastrostomy demonstrates insufficient anatomy for image guided placement. The transverse colon did not migrate caudally with insufflation of the stomach. Signed, Yvone Neu. Loreta Ave, DO [facility] Radiology Electronically Signed   By: Gilmer Mor D.O.   On: 10/04/2018 14:25   Ir Removal [employer] W/ [place] W/o Fl Mod Sed  Result Date: 10/14/2018 CLINICAL DATA:  History of head and neck squamous cell carcinoma. Status post right upper chest subcutaneous Port-A-Cath placement on 10/04/2018 with catheter access via the right internal jugular vein. Development Pseudomonas bacteremia now requiring Port-A-Cath removal due to suspected catheter seeding/source for infection. EXAM: REMOVAL OF IMPLANTED TUNNELED PORT-A-CATH MEDICATIONS: None PROCEDURE: The right chest Port-A-Cath site was prepped with chlorhexidine. A sterile gown and gloves were worn during the procedure. Local anesthesia was provided with 1% lidocaine. An incision was made overlying the Port-A-Cath with a #15 scalpel. Utilizing sharp and blunt dissection, the Port-A-Cath was removed. The pocked was debrided with gauze soaked in sterile saline. Iodoform gauze was then packed into the wound. A dressing was then applied over the iodoform gauze. FINDINGS: After making an incision, the port pocket was inspected and demonstrates bloody fluid which is mildly turbulent and felt to be likely infected. Tissue was also indurated and therefore the pocket was left open to heal secondarily. After debridement, iodoform gauze was packed  into the port wound. The entire Port-A-Cath and attached catheter were successfully removed. IMPRESSION: Removal of implanted Port-A-Cath utilizing sharp and blunt dissection. The port pocket demonstrates what appears to be likely infected hematoma. After debridement of bloody fluid and clot, the wound was packed with iodoform gauze. Electronically Signed   By: Irish Lack M.D.   On: 10/14/2018 14:34   Dg Chest Port 1 View  Result Date: 10/18/2018 CLINICAL DATA:  Shortness of breath. EXAM: PORTABLE CHEST 1 VIEW COMPARISON:  10/11/2018. FINDINGS: Interval removal  of PowerPort catheter. Tracheostomy tube noted in stable position. Heart size normal. Right base infiltrate with right-sided pleural effusion. No pneumothorax. IMPRESSION: 1. Interval removal of PowerPort catheter. Tracheostomy tube noted with tip in stable position. 2.  Right base infiltrate with right-sided pleural effusion. Electronically Signed   By: Maisie Fus  Register   On: 10/18/2018 13:35   Dg Chest Port 1 View  Result Date: 10/11/2018 CLINICAL DATA:  Nausea and vomiting yesterday.  New tracheostomy. EXAM: PORTABLE CHEST 1 VIEW COMPARISON:  Chest CT 09/29/2018 FINDINGS: Tracheostomy tube appears in adequate position. Right IJ Port-A-Cath has tip over the SVC. Lungs are adequately inflated without focal airspace consolidation or effusion. No pneumothorax. Cardiomediastinal silhouette is within normal. Dense radiopaque material/calcifications over the posteromedial right lower lobe as seen on CT scan. Remainder of the exam is unchanged. IMPRESSION: No acute cardiopulmonary disease. Tracheostomy tube and right IJ Port-A-Cath in adequate position. Electronically Signed   By: Elberta Fortis M.D.   On: 10/11/2018 15:40   Dg Abd Portable 1v  Result Date: 10/11/2018 CLINICAL DATA:  New tracheostomy tube. Nausea and vomiting yesterday. EXAM: PORTABLE ABDOMEN - 1 VIEW COMPARISON:  10/05/2018 FINDINGS: Percutaneous gastrostomy tube projects over the  stomach in the left upper quadrant. Bowel gas pattern is nonobstructive. No free peritoneal air. Remainder of the exam is unchanged. IMPRESSION: Nonobstructive bowel gas pattern. Gastrostomy tube projects over the stomach in the left upper quadrant. Electronically Signed   By: Elberta Fortis M.D.   On: 10/11/2018 15:41   Dg Abd Portable 1v  Result Date: 10/03/2018 CLINICAL DATA:  Dysphagia. EXAM: PORTABLE ABDOMEN - 1 VIEW COMPARISON:  Radiograph of October 02, 2018. FINDINGS: The bowel gas pattern is normal. Residual contrast is noted throughout the colon. No radio-opaque calculi or other significant radiographic abnormality are seen. IMPRESSION: No evidence of bowel obstruction or ileus. Electronically Signed   By: Lupita Raider M.D.   On: 10/03/2018 07:52   Dg Abd Portable 1v  Result Date: 10/02/2018 CLINICAL DATA:  Nasogastric tube advancement. EXAM: PORTABLE ABDOMEN - 1 VIEW COMPARISON:  10/02/2018 at 1839 hours FINDINGS: The nasogastric tube has been advanced. Tip now extends further into the stomach with the side hole entering the proximal stomach. Normal bowel gas pattern. IMPRESSION: 1. Advanced nasogastric tube, now fully within the stomach. Electronically Signed   By: Amie Portland M.D.   On: 10/02/2018 20:45   Dg Swallowing Func-speech Pathology  Result Date: 10/29/2018 Objective Swallowing Evaluation: Type of Study: MBS-Modified Barium Swallow Study  Patient Details Name: Logan Price MRN: [id number] Date of Birth: 1946/01/11 Today's Date: 10/29/2018 Time: SLP Start Time (ACUTE ONLY): 4098 -SLP Stop Time (ACUTE ONLY): 0901 SLP Time Calculation (min) (ACUTE ONLY): 26 min Past Medical History: Past Medical History: Diagnosis Date  Diverticulitis   False positive serological test for hepatitis C 12/13/2016  GERD (gastroesophageal reflux disease)   Hepatitis C   HTN (hypertension)   Hyperlipidemia  Past Surgical History: Past Surgical History: Procedure Laterality Date  BIOPSY  05/04/2015  Procedure:  BIOPSY;  Surgeon: West Bali, MD;  Location: [facility];  Service: Endoscopy;;  bx's of ileocecal valve   COLONOSCOPY WITH PROPOFOL N/A 05/04/2015  Dr. Darrick Penna: normal appearing ileum with prominent IC valve with tubular adenomas, moderate diverticulosis in sigmoid colon, ascending colon, and retum. Moderate sized internal hemorrhoids. Surveillance in 5 years  ESOPHAGOGASTRODUODENOSCOPY (EGD) WITH PROPOFOL N/A 12/19/2016  Procedure: ESOPHAGOGASTRODUODENOSCOPY (EGD) WITH PROPOFOL;  Surgeon: West Bali, MD;  [facility]: [facility];  Service: Endoscopy;  Laterality: N/A;  11:30am  FLEXIBLE SIGMOIDOSCOPY N/A 12/10/2015  hemorrhoid banding X 3   HEMORRHOID BANDING N/A 12/10/2015  Procedure: HEMORRHOID BANDING;  Surgeon: West Bali, MD;  Location: [facility];  Service: Endoscopy;  Laterality: N/A;  1:30 PM  IR GASTROSTOMY TUBE MOD SED  10/04/2018  IR IMAGING GUIDED PORT INSERTION  10/04/2018  IR REMOVAL TUN ACCESS W/ PORT W/O FL MOD SED  10/14/2018  LAPAROSCOPIC INSERTION GASTROSTOMY TUBE Left 10/07/2018  Procedure: LAPAROSCOPIC  GASTROSTOMY TUBE;  Surgeon: Rodman Pickle, MD;  Location: [facility] OR;  Service: General;  Laterality: Left;  MICROLARYNGOSCOPY N/A 09/27/2018  Procedure: MICRO DIRECT LARYNGOSCOPY WITH BIOPSY;  Surgeon: Newman Pies, MD;  Location[facility];  Service: ENT;  Laterality: N/A;  None to date    As of 04/14/15  POLYPECTOMY  05/04/2015  Procedure: POLYPECTOMY;  Surgeon: West Bali, MD;  Location: [facility];  Service: Endoscopy;;  descending colon polyp, ascending colon polyp  SAVORY DILATION N/A 12/19/2016  Procedure: SAVORY DILATION;  Surgeon: West Bali, MD;  Location: [facility];  Service: Endoscopy;  Laterality: N/A;  TRACHEOSTOMY TUBE PLACEMENT N/A 09/27/2018  Procedure: AWAKE TRACHEOSTOMY;  Surgeon: Newman Pies, MD;  Location: [facility] OR;  Service: ENT;  Laterality: N/A; HPI: Pt is a 74 yo male admitted for planned trach and laryngeal biopsy 7/2. Biopsy results pending but Neck  CT shows extensive laryngeal, pharyngeal, esophageal mass- diagnosed as cancer.  Pt is s/p trach and PEG tube.  He is undergoing radiation tx started 10/22/2018.   Esophagram 6/26 showed silent gross aspiration of barium. PMH includes: HLD, HTN, hepatitis C, GERD, diverticulitis.  Pt has been followed by SLP for dysphagia and pmsv.  He has previously declined to consume po intake but has been using his pmsv and conducting two swallowing exercises (lingual press and effortful swallow).  Subjective: pt awake in chair Assessment / Plan / Recommendation CH[facility] IP CLINICAL IMPRESSIONS 10/29/2018 Clinical Impression Patient presents with severe pharyngeal dysphagia characterized by decreased tongue base retraction, laryngeal elevation/closure resulting in laryngeal penetration and trace aspiration (silent). Pt  conducts multiple swallows - piecemealing with each bolus; self-created compensation strategy likely.  Trace aspiration cleared with cued cough/expectoration.  Pt was tested with PMSV on only.  SLP did not tax pt - only had him consume very small boluses to keep him from aspirating.  Recommend pt have ice chips and water independently; trials of other po (thin, nectar, puree) with SLP.  Using live monitor, pt educated to findings/compensation strategies with teach back.  He will need SLP at next venue of care to continue swallowing management.  Due to sensori-motor deficit, he will need repeat MBS prior to transitioning into po. SLP Visit Diagnosis Aphonia (R49.1);Dysphagia, unspecified (R13.10) Attention and concentration deficit following -- Frontal lobe and executive function deficit following -- Impact on safety and function Risk for inadequate nutrition/hydration;Moderate aspiration risk   CHL IP TREATMENT RECOMMENDATION 10/29/2018 Treatment Recommendations Therapy as outlined in treatment plan below   Prognosis 10/29/2018 Prognosis for Safe Diet Advancement Good Barriers to Reach Goals Other (Comment)  Barriers/Prognosis Comment -- CHL IP DIET RECOMMENDATION 10/29/2018 SLP Diet Recommendations NPO;Ice chips PRN after oral care;Free water protocol after oral care Liquid Administration via Cup;Spoon Medication Administration Via alternative means Compensations -- Postural Changes --   CHL IP OTHER RECOMMENDATIONS 10/29/2018 Recommended Consults -- Oral Care Recommendations Oral care QID Other Recommendations Have oral suction available   CHL IP FOLLOW UP RECOMMENDATIONS 10/29/2018 Follow  up Recommendations Skilled Nursing facility   [facility] IP FREQUENCY AND DURATION 10/29/2018 Speech Therapy Frequency (ACUTE ONLY) min 2x/week Treatment Duration 2 weeks      CHL IP ORAL PHASE 10/29/2018 Oral Phase WFL Oral - Pudding Teaspoon -- Oral - Pudding Cup -- Oral - Honey Teaspoon Piecemeal swallowing Oral - Honey Cup -- Oral - Nectar Teaspoon Piecemeal swallowing Oral - Nectar Cup Piecemeal swallowing Oral - Nectar Straw -- Oral - Thin Teaspoon Piecemeal swallowing Oral - Thin Cup Piecemeal swallowing Oral - Thin Straw -- Oral - Puree Piecemeal swallowing Oral - Mech Soft -- Oral - Regular -- Oral - Multi-Consistency -- Oral - Pill -- Oral Phase - Comment --  CHL IP PHARYNGEAL PHASE 10/29/2018 Pharyngeal Phase Impaired Pharyngeal- Pudding Teaspoon -- Pharyngeal -- Pharyngeal- Pudding Cup -- Pharyngeal -- Pharyngeal- Honey Teaspoon Pharyngeal residue - pyriform;Reduced laryngeal elevation;Reduced airway/laryngeal closure;Reduced epiglottic inversion;Reduced anterior laryngeal mobility;Reduced tongue base retraction Pharyngeal Material enters airway, remains ABOVE vocal cords and not ejected out Pharyngeal- Honey Cup -- Pharyngeal -- Pharyngeal- Nectar Teaspoon Reduced tongue base retraction;Pharyngeal residue - pyriform;Reduced epiglottic inversion;Reduced anterior laryngeal mobility;Reduced laryngeal elevation;Reduced airway/laryngeal closure Pharyngeal Material enters airway, remains ABOVE vocal cords and not ejected out Pharyngeal- Nectar  Cup Reduced epiglottic inversion;Reduced anterior laryngeal mobility;Reduced laryngeal elevation;Reduced airway/laryngeal closure;Reduced tongue base retraction;Trace aspiration;Penetration/Aspiration during swallow;Penetration/Apiration after swallow Pharyngeal Material enters airway, remains ABOVE vocal cords and not ejected out;Material enters airway, CONTACTS cords and not ejected out;Material enters airway, passes BELOW cords without attempt by patient to eject out (silent aspiration) Pharyngeal- Nectar Straw -- Pharyngeal -- Pharyngeal- Thin Teaspoon Reduced epiglottic inversion;Reduced anterior laryngeal mobility;Reduced laryngeal elevation;Reduced airway/laryngeal closure;Reduced tongue base retraction;Penetration/Aspiration during swallow;Penetration/Apiration after swallow Pharyngeal Material enters airway, remains ABOVE vocal cords and not ejected out;Material enters airway, CONTACTS cords and then ejected out Pharyngeal- Thin Cup Reduced epiglottic inversion;Reduced anterior laryngeal mobility;Reduced laryngeal elevation;Reduced airway/laryngeal closure;Reduced tongue base retraction;Penetration/Aspiration during swallow;Penetration/Apiration after swallow Pharyngeal Material enters airway, remains ABOVE vocal cords and not ejected out;Material enters airway, passes BELOW cords without attempt by patient to eject out (silent aspiration);Material enters airway, CONTACTS cords and then ejected out Pharyngeal- Thin Straw -- Pharyngeal -- Pharyngeal- Puree Reduced epiglottic inversion;Reduced anterior laryngeal mobility;Reduced laryngeal elevation;Reduced airway/laryngeal closure;Reduced tongue base retraction;Pharyngeal residue - valleculae;Pharyngeal residue - pyriform Pharyngeal Material does not enter airway Pharyngeal- Mechanical Soft -- Pharyngeal -- Pharyngeal- Regular -- Pharyngeal -- Pharyngeal- Multi-consistency -- Pharyngeal -- Pharyngeal- Pill -- Pharyngeal -- Pharyngeal Comment pt conducts  multiple swallows per bolus; piecemeals - which is likely self created compensatory strategy as he denies sensation of residuals; cued cough/"hock" effective to remove penetrates and trace aspirates  CHL IP CERVICAL ESOPHAGEAL PHASE 10/29/2018 Cervical Esophageal Phase Impaired Pudding Teaspoon -- Pudding Cup -- Honey Teaspoon -- Honey Cup -- Nectar Teaspoon -- Nectar Cup -- Nectar Straw -- Thin Teaspoon -- Thin Cup -- Thin Straw -- Puree -- Mechanical Soft -- Regular -- Multi-consistency -- Pill -- Cervical Esophageal Comment decreased UES opening Logan Price 10/29/2018, 9:47 AM Donavan Burnet, MS [facility] SLP Acute Rehab Services Pager [phone] Office [phone]              Ir Imaging Guided Port Insertion  Result Date: 10/04/2018 INDICATION: 73 year old male with a history squamous cell carcinoma and dysphagia EXAM: IMPLANTED PORT A CATH PLACEMENT WITH ULTRASOUND AND FLUOROSCOPIC GUIDANCE MEDICATIONS: 2 g Ancef; The antibiotic was administered within an appropriate time interval prior to skin puncture. ANESTHESIA/SEDATION: Moderate (conscious) sedation was employed during this procedure. A total of  Versed 1.0 mg and Fentanyl 25 mcg was administered intravenously. Moderate Sedation Time: 19 minutes. The patient's level of consciousness and vital signs were monitored continuously by radiology nursing throughout the procedure under my direct supervision. FLUOROSCOPY TIME:  0 minutes, 6 seconds (1 mGy) COMPLICATIONS: None PROCEDURE: The procedure, risks, benefits, and alternatives were explained to the patient. Questions regarding the procedure were encouraged and answered. The patient understands and consents to the procedure. Ultrasound survey was performed with images stored and sent to PACs. The right neck and chest was prepped with chlorhexidine, and draped in the usual sterile fashion using maximum barrier technique (cap and mask, sterile gown, sterile gloves, large sterile sheet, hand hygiene and  cutaneous antiseptic). Antibiotic prophylaxis was provided with 2.0g Ancef administered IV one hour prior to skin incision. Local anesthesia was attained by infiltration with 1% lidocaine without epinephrine. Ultrasound demonstrated patency of the right internal jugular vein, and this was documented with an image. Under real-time ultrasound guidance, this vein was accessed with a 21 gauge micropuncture needle and image documentation was performed. A small dermatotomy was made at the access site with an 11 scalpel. A 0.018" wire was advanced into the SVC and used to estimate the length of the internal catheter. The access needle exchanged for a 62F micropuncture vascular sheath. The 0.018" wire was then removed and a 0.035" wire advanced into the IVC. An appropriate location for the subcutaneous reservoir was selected below the clavicle and an incision was made through the skin and underlying soft tissues. The subcutaneous tissues were then dissected using a combination of blunt and sharp surgical technique and a pocket was formed. A single lumen power injectable portacatheter was then tunneled through the subcutaneous tissues from the pocket to the dermatotomy and the port reservoir placed within the subcutaneous pocket. The venous access site was then serially dilated and a peel away vascular sheath placed over the wire. The wire was removed and the port catheter advanced into position under fluoroscopic guidance. The catheter tip is positioned in the cavoatrial junction. This was documented with a spot image. The portacatheter was then tested and found to flush and aspirate well. The port was flushed with saline followed by 100 units/mL heparinized saline. The pocket was then closed in two layers using first subdermal inverted interrupted absorbable sutures followed by a running subcuticular suture. The epidermis was then sealed with Dermabond. The dermatotomy at the venous access site was also seal with Dermabond.  Patient tolerated the procedure well and remained hemodynamically stable throughout. No complications encountered and no significant blood loss encountered IMPRESSION: Status post port catheter placement.  Catheter ready for use. Signed, Yvone Neu. Reyne Dumas, RPVI [facility] Electronically Signed   By: Gilmer Mor D.O.   On: 10/04/2018 14:21   US Thoracentesis Asp Pleural Space W/img Guide  Result Date: 10/19/2018 INDICATION: Shortness of breath. Right-sided pleural effusion. Request for diagnostic and therapeutic thoracentesis. EXAM: ULTRASOUND GUIDED RIGHT THORACENTESIS MEDICATIONS: None. COMPLICATIONS: None immediate. PROCEDURE: An ultrasound guided thoracentesis was thoroughly discussed with the patient and questions answered. The benefits, risks, alternatives and complications were also discussed. The patient understands and wishes to proceed with the procedure. Written consent was obtained. Ultrasound was performed to localize and mark an adequate pocket of fluid in the right chest. The area was then prepped and draped in the normal sterile fashion. 1% Lidocaine was used for local anesthesia. Under ultrasound guidance a 6 Fr Safe-T-Centesis catheter was introduced. Thoracentesis was  performed. The catheter was removed and a dressing applied. FINDINGS: A total of approximately 450 mL of clear, pale yellow fluid was removed. Samples were sent to the laboratory as requested by the clinical team. IMPRESSION: Successful ultrasound guided right thoracentesis yielding 450 mL of pleural fluid. Read by: Brayton El PA-C Electronically Signed   By: Simonne Come M.D.   On: 10/19/2018 15:29    Lab Data:  CBC: Recent Labs  Lab 10/24/18 0457 10/27/18 0538  WBC 9.7 7.8  HGB 9.7* 10.3*  HCT 31.2* 34.1*  MCV 91.8 91.2  PLT 294 343   Basic Metabolic Panel: Recent Labs  Lab 10/24/18 0457 10/25/18 0438 10/27/18 0538 10/28/18 0435  10/29/18 0435  NA 138 138 137 137 138  K 4.3 4.3 4.3 4.2 4.3  CL 100 101 101 102 102  CO2 29 30 27 25 28   GLUCOSE 104* 108* 117* 117* 119*  BUN 15 13 17 19 20   CREATININE 0.74 0.66 0.66 0.73 0.71  CALCIUM 8.8* 9.1 8.8* 8.6* 8.9  MG  --  2.0  --   --   --   PHOS  --  4.4  --   --   --    GFR: Estimated Creatinine Clearance: 68.6 mL/min (by C-G formula based on SCr of 0.71 mg/dL). Liver Function Tests: Recent Labs  Lab 10/25/18 0438  AST 21  ALT 33  ALKPHOS 72  BILITOT 0.5  PROT 6.3*  ALBUMIN 2.5*   No results for input(s): LIPASE, AMYLASE in the last 168 hours. No results for input(s): AMMONIA in the last 168 hours. Coagulation Profile: No results for input(s): INR, PROTIME in the last 168 hours. Cardiac Enzymes: No results for input(s): CKTOTAL, CKMB, CKMBINDEX, TROPONINI in the last 168 hours. BNP (last 3 results) No results for input(s): PROBNP in the last 8760 hours. HbA1C: No results for input(s): HGBA1C in the last 72 hours. CBG: Recent Labs  Lab 10/29/18 1602 10/29/18 2038 10/30/18 0415 10/30/18 0757 10/30/18 1151  GLUCAP 94 93 85 79 76   Lipid Profile: No results for input(s): CHOL, HDL, LDLCALC, TRIG, CHOLHDL, LDLDIRECT in the last 72 hours. Thyroid Function Tests: No results for input(s): TSH, T4TOTAL, FREET4, T3FREE, THYROIDAB in the last 72 hours. Anemia Panel: No results for input(s): VITAMINB12, FOLATE, FERRITIN, TIBC, IRON, RETICCTPCT in the last 72 hours. Urine analysis:    Component Value Date/Time   COLORURINE YELLOW 03/30/2015 1330   APPEARANCEUR CLEAR 03/30/2015 1330   LABSPEC >1.030 (H) 03/30/2015 1330   PHURINE 5.5 03/30/2015 1330   GLUCOSEU NEGATIVE 03/30/2015 1330   HGBUR TRACE (A) 03/30/2015 1330   BILIRUBINUR NEGATIVE 03/30/2015 1330   KETONESUR NEGATIVE 03/30/2015 1330   PROTEINUR NEGATIVE 03/30/2015 1330   NITRITE NEGATIVE 03/30/2015 1330   LEUKOCYTESUR NEGATIVE 03/30/2015 1330     Logan Price M.D. [facility] 10/30/2018, 3:48 PM  Pager: [phone] Between 7am to 7pm - call Pager - [phone]  After 7pm go to [email or web address] - password [email or web address]  Call night coverage person covering after 7pm

## 2018-10-30 NOTE — Progress Notes (Signed)
Pt transported back to unit from radiation.

## 2018-10-30 NOTE — Progress Notes (Signed)
PT Cancellation Note  Patient Details Name: Logan Price MRN: [id number] DOB: 1946/01/29   Cancelled Treatment:    Reason Eval/Treat Not Completed: Fatigue/lethargy limiting ability to participate  Patient with eyes closed and able to be aroused by auditory and tactile cues. Patient remained lethargic closing his eyes throughout and indicating he did not want to work due to his PEG tube being pulled out and that he wants that fixed. Patient declined PT despite encouragement and explaination of benefits of mobility. Acute PT will follow at later date/time.  Kipp Brood, PT, DPT, [facility] Physical Therapist with [facility]  10/30/2018 10:09 AM

## 2018-10-31 ENCOUNTER — Ambulatory Visit
Admit: 2018-10-31 | Discharge: 2018-10-31 | Disposition: A | Discharge status: Home / Self Care | Payer: Medicare Other | Attending: Radiation Oncology | Admitting: Radiation Oncology

## 2018-10-31 LAB — GLUCOSE, CAPILLARY
Glucose-Capillary: 100 mg/dL — ABNORMAL HIGH (ref 70–99)
Glucose-Capillary: 101 mg/dL — ABNORMAL HIGH (ref 70–99)
Glucose-Capillary: 108 mg/dL — ABNORMAL HIGH (ref 70–99)
Glucose-Capillary: 111 mg/dL — ABNORMAL HIGH (ref 70–99)
Glucose-Capillary: 111 mg/dL — ABNORMAL HIGH (ref 70–99)
Glucose-Capillary: 92 mg/dL (ref 70–99)

## 2018-10-31 MED ORDER — METOPROLOL TARTRATE 25 MG/10 ML ORAL SUSPENSION
50.0000 mg | Freq: Three times a day (TID) | ORAL | Status: DC
  Administered 2018-10-31 – 2018-11-08 (×22): 50 mg
  Filled 2018-10-31 (×26): qty 20

## 2018-10-31 NOTE — Progress Notes (Signed)
PROGRESS NOTE  KIRAN RAWLES WJX:[id number] DOB: Oct 02, 1945 DOA: 09/27/2018 PCP: [name], FNP  Brief History   73 year old man admitted 7/3 for evaluation of large ulcerative mass at her right piriform sinus with extension obstructing laryngeal opening, he underwent awake tracheostomy and [employer] laryngoscopy and biopsy urgently 7/3.  Neck CT showed extensive laryngeal/pharyngeal mass, pathology subsequently revealed squamous cell carcinoma.  Patient was seen by oncology with recommendation for PEG tube placement and port placement to expedite outpatient therapy.  PET scan also planned for the outpatient setting.  TRH was consulted 7/9 for PSVT and treated with adenosine with successful conversion to sinus rhythm.  Port-A-Cath was placed 7/10 but interventional radiology was unable to place G-tube.  G-tube was placed by general surgery.  Plans were made for discharge 7/15 but the patient developed nausea, vomiting and diarrhea and tachycardia.  He developed increased secretions and was started on Levaquin 7/17.  7/18 he became hypotensive and blood culture was noted to be positive for Pseudomonas.  Had recurrent PSVT was seen by cardiology with recommendation for oral beta-blocker.  He was seen by infectious disease with recommendation for removal of port.  Port was removed with findings suggestive of infected hematoma so the wound was left open.  Because of this, oncology recommended proceeding with radiation therapy alone at this point.  Develop recurrent urinary retention 7/20 requiring replacement of Foley catheter.  Patient was transferred to [facility] long 7/22 for initiation of radiation therapy.  Developed shortness of breath 7/24, x-ray showed right basilar infiltrate and right pleural effusion, underwent thoracentesis.  Patient was treated for pneumonia.  He completed antibiotic therapy.  Plans were made for transfer to SNF prior to transfer PEG tube fell out and was replaced 8/5.  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  Started XRT 7/28. --Status post trach placement 7/3 and G-tube placement 7/13 --Speech therapy evaluation recommended n.p.o., high risk for aspiration --Placed on scopolamine for increased respiratory secretions --Needs trach care and suction --Outpatient PET scan planned  Severe protein calorie malnutrition, on tube feeds with associated diarrhea --Continue tube feeds.  PEG tube fell out 8/4 and was replaced 8/5. --Follow-up with Dr. Sheliah Hatch in the outpatient setting  Status post Pseudomonas bacteremia secondary to Port-A-Cath, tracheitis --Port-A-Cath removed 7/20, suspected to have infected hematoma --Infectious disease recommended IV cefepime total 2 weeks status post Port-A-Cath removal.  Finished course 8/4 --Repeat blood cultures negative --Interventional radiology recommended continuing dry dressing changes as needed  Acute kidney injury secondary to acute urinary retention earlier on hospitalization.  Renal function improved after Foley catheter placement.  Failed previous voiding trial and Foley catheter had to be replaced. --Outpatient neurology follow-up --Continue doxazosin (Flomax cannot be given through G-tube)  Paroxysmal SVT, reentry tachycardia, intermittent and recurrent, last episode 7/26.  Patient seen by cardiology.  2D echocardiogram unremarkable and TSH within normal limits. --Keep potassium around 4 magnesium above 2 --Follow-up with Dr., soreness in outpatient.  Cardiology recommended metoprolol 25 mg QID but this was decreased to twice daily because of hypotension.  Blood pressure is adequate.  Given recurrent episodes of SVT, will increase to 3 times daily.  Transitive pleural effusions, pneumonia --Status post ultrasound-guided thoracentesis 7/25 with 450 mL removed --Patient treated with  IV antibiotics.  Acute issues appear resolved.  Essential hypertension --Stable.  Bilateral upper lobe solid pulmonary nodules, largest 6 mm, all stable  since 02/22/2018 neck CT, more likely benign. Suggest follow-up chest CT in 12 months to demonstrate continued stability.  Aortic Atherosclerosis  --No inpatient treatment suggested  Emphysema by chest CT   No SNF beds currently.  Case management continues to work on this.  Stable for discharge.  DVT prophylaxis: heparin Code Status: Full Family Communication: none Disposition Plan: SNF?   Brendia Sacks, MD  [facility] Direct contact: see [email or web address] (further directions at bottom of note if needed) 7PM-7AM contact night coverage as at bottom of note 10/31/2018, 3:53 PM  LOS: 34 days   Consultants:    Admitted by ENT 7/3   Oncology: Dr. Dion Body 09/30/2018  Interventional radiology: Dr. Ardelle Anton 10/02/2018  [facility] hospitalist: Dr. Julian Reil 10/03/2018  General surgery: Dr. Luisa Hart 10/06/2018  Dental surgery: Dr. Valentino Hue 10/08/2018  Cardiology: Dr. Wyline Mood 10/13/2018  Infectious disease: Dr. Daiva Eves 10/13/2018  Radiation oncology  Procedures:   Port-A-Cath placement 7/10  7/13  laparoscopic placement of gastrostomy by T bars and push technique  Port-A-Cath removal per interventional radiology 10/14/2018  Ultrasound-guided thoracentesis 10/19/2018 per Brayton El, PA--- 450 cc of clear pale yellow fluid removed.  Echo IMPRESSIONS    1. The left ventricle has normal systolic function with an ejection fraction of 60-65%. The cavity size was normal. Left ventricular diastolic parameters were normal.  2. The right ventricle has normal systolic function. The cavity was normal. There is no increase in right ventricular wall thickness. Right ventricular systolic pressure could not be assessed.  3. Small pericardial effusion.  4. The pericardial effusion is anterior to the right ventricle.  5. The mitral valve is grossly  normal. No evidence of mitral valve stenosis.  6. The aortic valve is tricuspid. No stenosis of the aortic valve.  7. The aortic root and descending aorta are normal in size and structure.  SUMMARY   Normal LV EF. Small pericardial effusion localized anterior to RV, no echo evidence of tamponade. On Image 95, may have fibrin/adherent clot on RV surface within pericardial space (otherwise not well visualized).  Antibiotics  .   Interval History/Subjective  Feels okay today.  No pain, no complaints.  Breathing okay.  Per RN patient's been doing well and doing most of his own suctioning.  Objective   Vitals:  Vitals:   10/31/18 1305 10/31/18 1305  BP: 133/69 133/69  Pulse: 74 74  Resp: 20 20  Temp: 97.7 F (36.5 C) 97.7 F (36.5 C)  SpO2:  100%    Exam:  Constitutional:  . Appears calm and comfortable Respiratory:  . CTA bilaterally, no w/r/r.  . Respiratory effort normal.  Cardiovascular:  . RRR, no m/r/g . No LE extremity edema   Abdomen:  . Soft Psychiatric:  . Mental status o Mood, affect appropriate  I have personally reviewed the following:   Today's Data  . Afebrile, vital signs stable . Urine output 1 L . I/O grossly inaccurate (shows +58 L since admission) . No blood work since [employer]  . SARS-CoV-2 8/3 . Labs since admission reviewed  Micro Data  . Reviewed  Imaging  . Reviewed  Cardiology Data  . Reviewed  Other Data  .   Scheduled Meds: . chlorhexidine  15 mL Mouth Rinse BID  . doxazosin  1 mg Per Tube Daily  . feeding supplement (PRO-STAT SUGAR FREE 64)  30 mL Per Tube BID  . free water  200 mL Per Tube Q4H  . heparin injection (subcutaneous)  5,000 Units Subcutaneous  Q8H  . hydrocortisone   Rectal QID  . lidocaine  1 application Urethral Once  . mouth rinse  15 mL Mouth Rinse q12n4p  . metoprolol tartrate  50 mg Per Tube TID  . pantoprazole sodium  40 mg Per Tube Daily  . scopolamine  1 patch Transdermal Q72H  .  simethicone  160 mg Per Tube QID  . simvastatin  20 mg Per Tube q1800   Continuous Infusions: . sodium chloride Stopped (10/27/18 1603)  . Molli Posey Peptide 1.5 325 mL (10/31/18 0339)  . lactated ringers 10 mL/hr at 10/07/18 0859  . sodium chloride 500 mL/hr at 10/14/18 2049    Principal Problem:   Squamous cell carcinoma of glottis (HCC) Active Problems:   Goals of care, counseling/discussion   Dysphagia   Loss of weight   PSVT (paroxysmal supraventricular tachycardia) (HCC)   Protein-calorie malnutrition, severe   Bacteremia   Leukocytosis   Normocytic anemia   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HCC)   Pleural effusion   Shortness of breath   LOS: 34 days   How to contact the [facility] Attending or Consulting provider 7A - 7P or covering provider during after hours 7P -7A, for this patient?  1. Check the care team in [facility] and look for a) attending/consulting [facility] provider listed and b) the [facility] team listed 2. Log into [email or web address] and use [place]'s universal password to access. If you do not have the password, please contact the hospital operator. 3. Locate the [facility] provider you are looking for under [facility] and page to a number that you can be directly reached. 4. If you still have difficulty reaching the provider, please page the [facility] (Director on Call) for the Hospitalists listed on amion for assistance.

## 2018-10-31 NOTE — Progress Notes (Signed)
Physical Therapy Treatment Patient Details Name: Logan Price MRN: [id number] DOB: 1945-10-12 Today's Date: 10/31/2018    History of Present Illness Pt is a 73 yo male admitted for planned trach and laryngeal biopsy 7/2. 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 Esophagram 6/26 showed silent gross aspiration of barium. PMH includes: HLD, HTN, hepatitis C, GERD, diverticulitis    PT Comments    Pt is progressing well with mobility, he ambulated 350' with RW, no loss of balance. SaO2 98% on 3L O2 with trach collar.   Follow Up Recommendations  Home health PT     Equipment Recommendations  Rolling walker with 5" wheels    Recommendations for Other Services       Precautions / Restrictions Precautions Precautions: Fall Precaution Comments: increased trach secretions; Gtube Restrictions Weight Bearing Restrictions: No    Mobility  Bed Mobility Overal bed mobility: Modified Independent             General bed mobility comments: HOB up 30*, used rail  Transfers Overall transfer level: Modified independent Equipment used: None Transfers: Sit to/from Stand Sit to Stand: Supervision            Ambulation/Gait Ambulation/Gait assistance: Supervision Gait Distance (Feet): 350 Feet Assistive device: Rolling walker (2 wheeled) Gait Pattern/deviations: Step-through pattern;Trunk flexed Gait velocity: WNL   General Gait Details: pt on 3L O2 with trach collar, SaO2 98%, no loss of balance   Stairs             Wheelchair Mobility    Modified Rankin (Stroke Patients Only)       Balance Overall balance assessment: Needs assistance   Sitting balance-Leahy Scale: Good Sitting balance - Comments: leaning down in sitting due to SOB with ambulation     Standing balance-Leahy Scale: Fair Standing balance comment: BUE support for dynamic standing                             Cognition Arousal/Alertness: (P) Awake/alert Behavior During Therapy: (P) WFL for tasks assessed/performed Overall Cognitive Status: (P) Within Functional Limits for tasks assessed                                        Exercises      General Comments        Pertinent Vitals/Pain Pain Assessment: No/denies pain    Home Living                      Prior Function            PT Goals (current goals can now be found in the care plan section) Acute Rehab PT Goals Patient Stated Goal: to take care of himself PT Goal Formulation: With patient Time For Goal Achievement: 10/31/18 Potential to Achieve Goals: Good Progress towards PT goals: Progressing toward goals    Frequency    Min 3X/week      PT Plan Current plan remains appropriate    Co-evaluation              AM-PAC PT "6 Clicks" Mobility   Outcome Measure  Help needed turning from your back to your side while in a flat bed without using bedrails?: None Help needed moving from lying on your back to sitting on the side of  a flat bed without using bedrails?: None Help needed moving to and from a bed to a chair (including a wheelchair)?: A Little Help needed standing up from a chair using your arms (e.g., wheelchair or bedside chair)?: None Help needed to walk in hospital room?: A Little Help needed climbing 3-5 steps with a railing? : A Lot 6 Click Score: 20    End of Session Equipment Utilized During Treatment: Oxygen;Gait belt Activity Tolerance: Patient tolerated treatment well Patient left: with call bell/phone within reach;in bed Nurse Communication: Mobility status PT Visit Diagnosis: Other abnormalities of gait and mobility (R26.89);Muscle weakness (generalized) (M62.81)     Time: 2440-1027 PT Time Calculation (min) (ACUTE ONLY): 18 min  Charges:  $Gait Training: 8-22 mins                     Ralene Bathe Kistler PT 10/31/2018  Acute Rehabilitation  Services Pager [phone] Office [phone]

## 2018-10-31 NOTE — Progress Notes (Signed)
A call to confirm bed at [facility] revealed there are no beds. A call to a different facility revealed they are unable to take pt related to type of tube feeding.

## 2018-10-31 NOTE — Care Management Important Message (Signed)
Important Message  Patient Details IM Letter given to Cookie McGibboney RN to present to the Patient Name: Logan Price MRN: [id number] Date of Birth: 07/08/1945   Medicare Important Message Given:  Yes     Kerin Salen 10/31/2018, 10:48 AM

## 2018-10-31 NOTE — Progress Notes (Signed)
[facility] Therapy Treatment Record Phone [phone]   Radiation Therapy was administered to Logan Price on: 10/31/2018  2:44 PM and was treatment # 8 out of a planned course of 35 treatments.  Radiation Treatment  1). Beam photons with 6-10 energy  2). Brachytherapy None  3). Stereotactic Radiosurgery None  4). Other Radiation None     Para Skeans

## 2018-10-31 NOTE — Progress Notes (Signed)
IR.  Laryngeal cancer s/p Port-a-cath placement in IR 10/04/2018 by Dr. Earleen Newport, subsequently developed bacteremia thought to be cause by Port-a-cath infection s/p Port-a-cath removal 10/14/2018 by Dr. Kathlene Cote, findings revealed likely infected hematoma so wound left open.  Right chest port removal site c/d/i- non-tender, no signs of drainage or active bleeding. Continue with dry dressing changes PRN.  Please call IR with questions/concerns.   Bea Graff Xsavier Seeley, PA-C 10/31/2018, 1:29 PM

## 2018-11-01 ENCOUNTER — Ambulatory Visit
Admit: 2018-11-01 | Discharge: 2018-11-01 | Disposition: A | Discharge status: Home / Self Care | Payer: Medicare Other | Attending: Radiation Oncology | Admitting: Radiation Oncology

## 2018-11-01 ENCOUNTER — Ambulatory Visit: Payer: Medicare Other | Admitting: Physician Assistant

## 2018-11-01 LAB — BASIC METABOLIC PANEL
Anion gap: 10 (ref 5–15)
BUN: 19 mg/dL (ref 8–23)
CO2: 29 mmol/L (ref 22–32)
Calcium: 9 mg/dL (ref 8.9–10.3)
Chloride: 96 mmol/L — ABNORMAL LOW (ref 98–111)
Creatinine, Ser: 0.74 mg/dL (ref 0.61–1.24)
GFR calc Af Amer: >60 mL/min (ref >60)
GFR calc non Af Amer: >60 mL/min (ref >60)
Glucose, Bld: 119 mg/dL — ABNORMAL HIGH (ref 70–99)
Potassium: 4.2 mmol/L (ref 3.5–5.1)
Sodium: 135 mmol/L (ref 135–145)

## 2018-11-01 LAB — GLUCOSE, CAPILLARY
Glucose-Capillary: 119 mg/dL — ABNORMAL HIGH (ref 70–99)
Glucose-Capillary: 115 mg/dL — ABNORMAL HIGH (ref 70–99)
Glucose-Capillary: 113 mg/dL — ABNORMAL HIGH (ref 70–99)
Glucose-Capillary: 108 mg/dL — ABNORMAL HIGH (ref 70–99)
Glucose-Capillary: 90 mg/dL (ref 70–99)

## 2018-11-01 LAB — MAGNESIUM: Magnesium: 2.1 mg/dL (ref 1.7–2.4)

## 2018-11-01 NOTE — Plan of Care (Signed)
  Problem: Health Behavior/Discharge Planning: Goal: Ability to manage health-related needs will improve Outcome: Progressing   Problem: Clinical Measurements: Goal: Ability to maintain clinical measurements within normal limits will improve Outcome: Progressing Goal: Will remain free from infection Outcome: Progressing   

## 2018-11-01 NOTE — Progress Notes (Signed)
PROGRESS NOTE  Logan Price NWG:[id number] DOB: April 10, 1945 DOA: 09/27/2018 PCP: [name], FNP  Brief History   73 year old man admitted 7/3 for evaluation of large ulcerative mass at her right piriform sinus with extension obstructing laryngeal opening, he underwent awake tracheostomy and [facility] laryngoscopy and biopsy urgently 7/3.  Neck CT showed extensive laryngeal/pharyngeal mass, pathology subsequently revealed squamous cell carcinoma.  Patient was seen by oncology with recommendation for PEG tube placement and port placement to expedite outpatient therapy.  PET scan also planned for the outpatient setting.  TRH was consulted 7/9 for PSVT and treated with adenosine with successful conversion to sinus rhythm.  Port-A-Cath was placed 7/10 but interventional radiology was unable to place G-tube.  G-tube was placed by general surgery.  Plans were made for discharge 7/15 but the patient developed nausea, vomiting and diarrhea and tachycardia.  He developed increased secretions and was started on Levaquin 7/17.  7/18 he became hypotensive and blood culture was noted to be positive for Pseudomonas.  Had recurrent PSVT was seen by cardiology with recommendation for oral beta-blocker.  He was seen by infectious disease with recommendation for removal of port.  Port was removed with findings suggestive of infected hematoma so the wound was left open.  Because of this, oncology recommended proceeding with radiation therapy alone at this point.  Develop recurrent urinary retention 7/20 requiring replacement of Foley catheter.  Patient was transferred to [facility] long 7/22 for initiation of radiation therapy.  Developed shortness of breath 7/24, x-ray showed right basilar infiltrate and right pleural effusion, underwent thoracentesis.  Patient was treated for pneumonia.  He completed antibiotic therapy.  Plans were made for transfer to SNF prior to transfer PEG tube fell out and was replaced 8/5.  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  Started XRT 7/28. --Status post trach placement 7/3 and G-tube placement 7/13 --Speech therapy evaluation recommended n.p.o., high risk for aspiration --Placed on scopolamine for increased respiratory secretions --Needs trach care and suction --Outpatient PET scan planned  Severe protein calorie malnutrition, on tube feeds with associated diarrhea --Continue tube feeds.  PEG tube fell out 8/4 and was replaced 8/5. --Follow-up with Dr. Sheliah Hatch in the outpatient setting  Status post Pseudomonas bacteremia secondary to Port-A-Cath, tracheitis --Port-A-Cath removed 7/20, suspected to have infected hematoma --Infectious disease recommended IV cefepime total 2 weeks status post Port-A-Cath removal.  Finished course 8/4 --Repeat blood cultures negative --Interventional radiology recommended continuing dry dressing changes as needed.  Continue dressing changes as needed.  Acute kidney injury secondary to acute urinary retention earlier on hospitalization.  Renal function improved after Foley catheter placement.  Failed previous voiding trial and Foley catheter had to be replaced. --Outpatient neurology follow-up --Continue doxazosin (Flomax cannot be given through G-tube)  Paroxysmal SVT, reentry tachycardia, intermittent and recurrent, last episode 7/26.  Patient seen by cardiology.  2D echocardiogram unremarkable and TSH within normal limits. --Keep potassium around 4 magnesium above 2 --Telemetry shows sinus rhythm. --Follow-up with Dr. Purvis Sheffield as outpatient.  Cardiology recommended metoprolol 25 mg QID but this was decreased to twice daily because of hypotension.  Increase to 3 times daily yesterday and tolerating well.  Transitive pleural effusions, pneumonia --Status post ultrasound-guided thoracentesis 7/25 with 450  mL removed --Patient treated with IV antibiotics.  Acute issues appear resolved.  Essential hypertension --Remains stable.  Bilateral upper lobe solid pulmonary nodules, largest  6 mm, all stable since 02/22/2018 neck CT, more likely benign. Suggest follow-up chest CT in 12 months to demonstrate continued stability.  Aortic Atherosclerosis  --No inpatient treatment suggested  Emphysema by chest CT   Remains stable for transfer to skilled nursing facility when bed available.  DVT prophylaxis: heparin Code Status: Full Family Communication: none Disposition Plan: SNF?   Brendia Sacks, MD  [facility] Direct contact: see [email or web address] (further directions at bottom of note if needed) 7PM-7AM contact night coverage as at bottom of note 11/01/2018, 10:37 AM  LOS: 35 days   Consultants:    Admitted by ENT 7/3   Oncology: Dr. Dion Body 09/30/2018  Interventional radiology: Dr. Ardelle Anton 10/02/2018  [facility] hospitalist: Dr. Julian Reil 10/03/2018  General surgery: Dr. Luisa Hart 10/06/2018  Dental surgery: Dr. Valentino Hue 10/08/2018  Cardiology: Dr. Wyline Mood 10/13/2018  Infectious disease: Dr. Daiva Eves 10/13/2018  Radiation oncology  Procedures:   Port-A-Cath placement 7/10  7/13  laparoscopic placement of gastrostomy by T bars and push technique  Port-A-Cath removal per interventional radiology 10/14/2018  Ultrasound-guided thoracentesis 10/19/2018 per Brayton El, PA--- 450 cc of clear pale yellow fluid removed.  Echo IMPRESSIONS    1. The left ventricle has normal systolic function with an ejection fraction of 60-65%. The cavity size was normal. Left ventricular diastolic parameters were normal.  2. The right ventricle has normal systolic function. The cavity was normal. There is no increase in right ventricular wall thickness. Right ventricular systolic pressure could not be assessed.  3. Small pericardial effusion.  4. The pericardial effusion is anterior to the right ventricle.   5. The mitral valve is grossly normal. No evidence of mitral valve stenosis.  6. The aortic valve is tricuspid. No stenosis of the aortic valve.  7. The aortic root and descending aorta are normal in size and structure.  SUMMARY   Normal LV EF. Small pericardial effusion localized anterior to RV, no echo evidence of tamponade. On Image 95, may have fibrin/adherent clot on RV surface within pericardial space (otherwise not well visualized).  Antibiotics  .   Interval History/Subjective  No new issues.  Feels okay.  No complaints.  No nausea or vomiting.  Breathing fine.  Objective   Vitals:  Vitals:   11/01/18 0426 11/01/18 0902  BP: (!) 109/49   Pulse: 71   Resp: 16   Temp: 99.5 F (37.5 C)   SpO2: 99% 98%    Exam:  Constitutional:   . Appears calm and comfortable Respiratory:  . CTA bilaterally, no w/r/r.  . Respiratory effort normal.  Cardiovascular:  . RRR, no m/r/g . No LE extremity edema   Abdomen:  . Soft Psychiatric:  . Mental status o Mood, affect appropriate  I have personally reviewed the following:   Today's Data  . Afebrile, vital signs stable. . CBG stable . BMP unremarkable. . Magnesium within normal limits.  Lab Data  . SARS-CoV-2 8/3 . Labs since admission reviewed  Micro Data  . Reviewed  Imaging  . Reviewed  Cardiology Data  . Reviewed  Other Data  .   Scheduled Meds: . chlorhexidine  15 mL Mouth Rinse BID  . doxazosin  1 mg Per Tube Daily  . feeding supplement (PRO-STAT SUGAR FREE 64)  30 mL Per Tube BID  . free water  200 mL Per Tube Q4H  . heparin injection (subcutaneous)  5,000 Units Subcutaneous Q8H  . hydrocortisone   Rectal QID  . lidocaine  1 application Urethral Once  . mouth  rinse  15 mL Mouth Rinse q12n4p  . metoprolol tartrate  50 mg Per Tube TID  . pantoprazole sodium  40 mg Per Tube Daily  . scopolamine  1 patch Transdermal Q72H  . simethicone  160 mg Per Tube QID  . simvastatin  20 mg Per Tube q1800    Continuous Infusions: . sodium chloride Stopped (10/27/18 1603)  . Molli Posey Peptide 1.5 325 mL (10/31/18 2350)  . lactated ringers 10 mL/hr at 10/07/18 0859  . sodium chloride 500 mL/hr at 10/14/18 2049    Principal Problem:   Squamous cell carcinoma of glottis (HCC) Active Problems:   Goals of care, counseling/discussion   Dysphagia   Loss of weight   PSVT (paroxysmal supraventricular tachycardia) (HCC)   Protein-calorie malnutrition, severe   Bacteremia   Leukocytosis   Normocytic anemia   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HCC)   Pleural effusion   Shortness of breath   LOS: 35 days   How to contact the [facility] Attending or Consulting provider 7A - 7P or covering provider during after hours 7P -7A, for this patient?  1. Check the care team in [facility] and look for a) attending/consulting TR[facility] provider listed and b) the [facility] team listed 2. Log into [email or web address] and use [facility]'s universal password to access. If you do not have the password, please contact the hospital operator. 3. Locate the [facility] provider you are looking for under [facility] and page to a number that you can be directly reached. 4. If you still have difficulty reaching the provider, please page the [name] (Director on Call) for the Hospitalists listed on amion for assistance.

## 2018-11-01 NOTE — Progress Notes (Signed)
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 PMH includes: HLD, HTN, hepatitis C, GERD, diverticulitis    PT Comments    Pt ambulated in hallway and progressing with mobility.   Follow Up Recommendations  Home health PT     Equipment Recommendations  Rolling walker with 5" wheels    Recommendations for Other Services       Precautions / Restrictions Precautions Precautions: Fall Precaution Comments: increased trach secretions; Status post trach placement 7/3 and G-tube placement 7/13    Mobility  Bed Mobility Overal bed mobility: Modified Independent             General bed mobility comments: HOB up 30*, used rail  Transfers Overall transfer level: Needs assistance Equipment used: Rolling walker (2 wheeled) Transfers: Sit to/from Stand Sit to Stand: Supervision            Ambulation/Gait Ambulation/Gait assistance: Supervision;Min guard Gait Distance (Feet): 350 Feet Assistive device: Rolling walker (2 wheeled) Gait Pattern/deviations: Step-through pattern;Trunk flexed     General Gait Details: pt remained on oxygen with trach collar, Spo2 99% upon returning to room, pt mildly unsteady today however no overt LOB requiring assist   Stairs             Wheelchair Mobility    Modified Rankin (Stroke Patients Only)       Balance                                            Cognition Arousal/Alertness: Awake/alert Behavior During Therapy: WFL for tasks assessed/performed Overall Cognitive Status: Within Functional Limits for tasks assessed                                        Exercises      General Comments        Pertinent Vitals/Pain      Home Living                       Prior Function            PT Goals (current goals can now be found in the care plan section) Progress towards PT goals: Progressing toward goals    Frequency    Min 3X/week      PT Plan Current plan remains appropriate    Co-evaluation              AM-PAC PT "6 Clicks" Mobility   Outcome Measure  Help needed turning from your back to your side while in a flat bed without using bedrails?: None Help needed moving from lying on your back to sitting on the side of a flat bed without using bedrails?: None Help needed moving to and from a bed to a chair (including a wheelchair)?: A Little Help needed standing up from a chair using your arms (e.g., wheelchair or bedside chair)?: A Little Help needed to walk in hospital room?: A Little Help needed climbing 3-5 steps with a railing? : A Little 6 Click Score: 20    End of  Session Equipment Utilized During Treatment: Oxygen Activity Tolerance: Patient tolerated treatment well Patient left: with call bell/phone within reach;in bed;with bed alarm set   PT Visit Diagnosis: Other abnormalities of gait and mobility (R26.89);Muscle weakness (generalized) (M62.81)     Time: 5397-6734 PT Time Calculation (min) (ACUTE ONLY): 16 min  Charges:  $Gait Training: 8-22 mins                     Zenovia Jarred, PT, DPT Acute Rehabilitation Services Office: [phone] Pager: [phone]  Sarajane Jews 11/01/2018, 3:26 PM

## 2018-11-01 NOTE — Progress Notes (Signed)
[facility] Therapy Treatment Record Phone [phone]   Radiation Therapy was administered to Logan Price on: 11/01/2018  3:56 PM and was treatment # 9 out of a planned course of 35 treatments.  Radiation Treatment  1). Beam photons with 6-10 energy  2). Brachytherapy None  3). Stereotactic Radiosurgery None  4). Other Radiation None     Logan Price Krystin Keeven, RT (T)

## 2018-11-01 NOTE — Plan of Care (Signed)
  Problem: Health Behavior/Discharge Planning: Goal: Ability to manage health-related needs will improve 11/01/2018 1531 by Zadie Rhine, RN Outcome: Progressing 11/01/2018 1503 by Zadie Rhine, RN Outcome: Progressing   Problem: Clinical Measurements: Goal: Ability to maintain clinical measurements within normal limits will improve 11/01/2018 1531 by Zadie Rhine, RN Outcome: Progressing 11/01/2018 1503 by Zadie Rhine, RN Outcome: Progressing Goal: Will remain free from infection 11/01/2018 1531 by Zadie Rhine, RN Outcome: Progressing 11/01/2018 1503 by Zadie Rhine, RN Outcome: Progressing Goal: Diagnostic test results will improve Outcome: Progressing Goal: Respiratory complications will improve Outcome: Progressing Goal: Cardiovascular complication will be avoided Outcome: Progressing   Problem: Health Behavior/Discharge Planning: Goal: Ability to manage tracheostomy will improve Outcome: Progressing   Problem: Role Relationship: Goal: Ability to communicate will improve Outcome: Progressing   Problem: Clinical Measurements: Goal: Signs and symptoms of infection will decrease Outcome: Progressing   Problem: Education: Goal: Knowledge about tracheostomy care/management will improve Outcome: Progressing   Problem: Activity: Goal: Ability to tolerate increased activity will improve Outcome: Progressing   Problem: Health Behavior/Discharge Planning: Goal: Ability to manage tracheostomy will improve Outcome: Progressing   Problem: Respiratory: Goal: Patent airway maintenance will improve Outcome: Progressing   Problem: Role Relationship: Goal: Ability to communicate will improve Outcome: Progressing

## 2018-11-02 LAB — GLUCOSE, CAPILLARY
Glucose-Capillary: 106 mg/dL — ABNORMAL HIGH (ref 70–99)
Glucose-Capillary: 116 mg/dL — ABNORMAL HIGH (ref 70–99)
Glucose-Capillary: 117 mg/dL — ABNORMAL HIGH (ref 70–99)
Glucose-Capillary: 118 mg/dL — ABNORMAL HIGH (ref 70–99)
Glucose-Capillary: 121 mg/dL — ABNORMAL HIGH (ref 70–99)
Glucose-Capillary: 98 mg/dL (ref 70–99)

## 2018-11-02 MED ORDER — POLYETHYLENE GLYCOL 3350 17 G PO PACK: 17.0000 g | PACK | Freq: Every day | ORAL | Status: DC | PRN

## 2018-11-02 MED ORDER — SENNOSIDES-DOCUSATE SODIUM 8.6-50 MG PO TABS: 2.0000 | ORAL_TABLET | Freq: Every evening | ORAL | Status: DC | PRN

## 2018-11-02 NOTE — Progress Notes (Signed)
PROGRESS NOTE  Logan Price EAV:[id number] DOB: 02-13-1946 DOA: 09/27/2018 PCP: [name], FNP  Brief History   73 year old man admitted 7/3 for evaluation of large ulcerative mass at her right piriform sinus with extension obstructing laryngeal opening, he underwent awake tracheostomy and [employer] laryngoscopy and biopsy urgently 7/3.  Neck CT showed extensive laryngeal/pharyngeal mass, pathology subsequently revealed squamous cell carcinoma.  Patient was seen by oncology with recommendation for PEG tube placement and port placement to expedite outpatient therapy.  PET scan also planned for the outpatient setting.  TRH was consulted 7/9 for PSVT and treated with adenosine with successful conversion to sinus rhythm.  Port-A-Cath was placed 7/10 but interventional radiology was unable to place G-tube.  G-tube was placed by general surgery.  Plans were made for discharge 7/15 but the patient developed nausea, vomiting and diarrhea and tachycardia.  He developed increased secretions and was started on Levaquin 7/17.  7/18 he became hypotensive and blood culture was noted to be positive for Pseudomonas.  Had recurrent PSVT was seen by cardiology with recommendation for oral beta-blocker.  He was seen by infectious disease with recommendation for removal of port.  Port was removed with findings suggestive of infected hematoma so the wound was left open.  Because of this, oncology recommended proceeding with radiation therapy alone at this point.  Develop recurrent urinary retention 7/20 requiring replacement of Foley catheter.  Patient was transferred to [facility] long 7/22 for initiation of radiation therapy.  Developed shortness of breath 7/24, x-ray showed right basilar infiltrate and right pleural effusion, underwent thoracentesis.  Patient was treated for pneumonia.  He completed antibiotic therapy.  Plans were made for transfer to SNF prior to transfer PEG tube fell out and was replaced 8/5.  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  Started XRT 7/28. Status post trach placement 7/3 and G-tube placement 7/13. Speech therapy evaluation recommended n.p.o., high risk for aspiration. Placed on scopolamine for increased respiratory secretions --Needs trach care and suction --Outpatient PET scan planned --Stable for discharge  Severe protein calorie malnutrition, on tube feeds with associated diarrhea --Continue tube feeds.  PEG tube fell out 8/4 and was replaced 8/5. --Follow-up with Dr. Sheliah Hatch in the outpatient setting  Status post Pseudomonas bacteremia secondary to Port-A-Cath, tracheitis --Port-A-Cath removed 7/20, suspected to have infected hematoma --Infectious disease recommended IV cefepime total 2 weeks status post Port-A-Cath removal.  Finished course 8/4 --Repeat blood cultures negative --Interventional radiology recommended continuing dry dressing changes as needed.  Continue dressing changes as needed.  Acute kidney injury secondary to acute urinary retention earlier on hospitalization.  Renal function improved after Foley catheter placement.  Failed previous voiding trial and Foley catheter had to be replaced. --Outpatient neurology follow-up --Continue doxazosin (Flomax cannot be given through G-tube)  Paroxysmal SVT, reentry tachycardia, intermittent and recurrent, last episode 7/26.  Patient seen by cardiology.  2D echocardiogram unremarkable and TSH within normal limits. --Keep potassium around 4 magnesium above 2 --Telemetry shows sinus rhythm. --Follow-up with Dr. Purvis Sheffield as outpatient.  Cardiology recommended metoprolol 25 mg QID but this was decreased to twice daily because of hypotension.  Increase to 3 times daily yesterday and tolerating well. --Continue metoprolol  Transitive pleural effusions, pneumonia --Status post  ultrasound-guided thoracentesis 7/25 with 450 mL removed --Patient treated with IV antibiotics.  Acute issues appear resolved.  Essential hypertension --Remains stable.  Bilateral upper  lobe solid pulmonary nodules, largest 6 mm, all stable since 02/22/2018 neck CT, more likely benign. Suggest follow-up chest CT in 12 months to demonstrate continued stability.  Aortic Atherosclerosis  --No inpatient treatment suggested  Emphysema by chest CT   Stable for transfer to skilled nursing facility when bed available  DVT prophylaxis: heparin Code Status: Full Family Communication: none Disposition Plan: SNF?   Brendia Sacks, MD  [facility] Direct contact: see [email or web address] (further directions at bottom of note if needed) 7PM-7AM contact night coverage as at bottom of note 11/02/2018, 1:24 PM  LOS: 36 days   Consultants:    Admitted by ENT 7/3   Oncology: Dr. Dion Body 09/30/2018  Interventional radiology: Dr. Ardelle Anton 10/02/2018  [facility] hospitalist: Dr. Julian Reil 10/03/2018  General surgery: Dr. Luisa Hart 10/06/2018  Dental surgery: Dr. Valentino Hue 10/08/2018  Cardiology: Dr. Wyline Mood 10/13/2018  Infectious disease: Dr. Daiva Eves 10/13/2018  Radiation oncology  Procedures:   Port-A-Cath placement 7/10  7/13  laparoscopic placement of gastrostomy by T bars and push technique  Port-A-Cath removal per interventional radiology 10/14/2018  Ultrasound-guided thoracentesis 10/19/2018 per Brayton El, PA--- 450 cc of clear pale yellow fluid removed.  Echo IMPRESSIONS    1. The left ventricle has normal systolic function with an ejection fraction of 60-65%. The cavity size was normal. Left ventricular diastolic parameters were normal.  2. The right ventricle has normal systolic function. The cavity was normal. There is no increase in right ventricular wall thickness. Right ventricular systolic pressure could not be assessed.  3. Small pericardial effusion.  4. The pericardial effusion is  anterior to the right ventricle.  5. The mitral valve is grossly normal. No evidence of mitral valve stenosis.  6. The aortic valve is tricuspid. No stenosis of the aortic valve.  7. The aortic root and descending aorta are normal in size and structure.  SUMMARY   Normal LV EF. Small pericardial effusion localized anterior to RV, no echo evidence of tamponade. On Image 95, may have fibrin/adherent clot on RV surface within pericardial space (otherwise not well visualized).  Antibiotics  .   Interval History/Subjective  Denies complaints.  Seen with nurse at bedside.  No new issues.  Objective   Vitals:  Vitals:   11/02/18 0851 11/02/18 1153  BP:    Pulse: 92 89  Resp: 16 17  Temp:    SpO2: 100% 97%    Exam:  Constitutional:   . Appears calm and comfortable Respiratory:  . CTA bilaterally, no w/r/r.  . Respiratory effort normal.  Cardiovascular:  . RRR, no m/r/g . No LE extremity edema   Abdomen:  . Soft Psychiatric:  . Mental status o Mood, affect appropriate  I have personally reviewed the following:   Today's Data  . Urine output 1275.  Note I/O's not accurate. . CBG stable  Lab Data  . SARS-CoV-2 8/3 . Labs since admission reviewed  Micro Data  . Reviewed  Imaging  . Reviewed  Cardiology Data  . Reviewed  Other Data  .   Scheduled Meds: . chlorhexidine  15 mL Mouth Rinse BID  . doxazosin  1 mg Per Tube Daily  . feeding supplement (PRO-STAT SUGAR FREE 64)  30 mL Per Tube BID  . free water  200 mL Per Tube Q4H  . heparin injection (subcutaneous)  5,000 Units Subcutaneous Q8H  . hydrocortisone   Rectal QID  . lidocaine  1 application Urethral Once  . mouth rinse  15 mL Mouth Rinse q12n4p  .  metoprolol tartrate  50 mg Per Tube TID  . pantoprazole sodium  40 mg Per Tube Daily  . scopolamine  1 patch Transdermal Q72H  . simethicone  160 mg Per Tube QID  . simvastatin  20 mg Per Tube q1800   Continuous Infusions: . sodium chloride  Stopped (10/27/18 1603)  . Molli Posey Peptide 1.5 325 mL (11/02/18 1001)  . lactated ringers 10 mL/hr at 10/07/18 0859  . sodium chloride 500 mL/hr at 10/14/18 2049    Principal Problem:   Squamous cell carcinoma of glottis (HCC) Active Problems:   Goals of care, counseling/discussion   Dysphagia   Loss of weight   PSVT (paroxysmal supraventricular tachycardia) (HCC)   Protein-calorie malnutrition, severe   Bacteremia   Leukocytosis   Normocytic anemia   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HCC)   Pleural effusion   Shortness of breath   LOS: 36 days   How to contact the [facility] Attending or Consulting provider 7A - 7P or covering provider during after hours 7P -7A, for this patient?  1. Check the care team in [facility] and look for a) attending/consulting TRH provider listed and b) the [facility] team listed 2. Log into [email or web address] and use [place]'s universal password to access. If you do not have the password, please contact the hospital operator. 3. Locate the [facility] provider you are looking for under [facility] and page to a number that you can be directly reached. 4. If you still have difficulty reaching the provider, please page the [facility] (Director on Call) for the Hospitalists listed on amion for assistance.

## 2018-11-02 NOTE — Plan of Care (Signed)
Pt without complains on 7a-7p shift. Pt up to [facility] x2.

## 2018-11-03 HISTORY — PX: IR REPLACE G-TUBE SIMPLE WO FLUORO: IMG2323

## 2018-11-03 LAB — GLUCOSE, CAPILLARY
Glucose-Capillary: 106 mg/dL — ABNORMAL HIGH (ref 70–99)
Glucose-Capillary: 111 mg/dL — ABNORMAL HIGH (ref 70–99)
Glucose-Capillary: 112 mg/dL — ABNORMAL HIGH (ref 70–99)
Glucose-Capillary: 114 mg/dL — ABNORMAL HIGH (ref 70–99)
Glucose-Capillary: 119 mg/dL — ABNORMAL HIGH (ref 70–99)
Glucose-Capillary: 121 mg/dL — ABNORMAL HIGH (ref 70–99)

## 2018-11-03 NOTE — Progress Notes (Signed)
Interventional Radiology Progress Note  Bedside rescue of displaced perc g tube.  New 24F balloon retention.  6cc saline into the balloon, which was cinched to the skin with bumper  Recommendations:  - Ok to use - May be useful to tape to skin, as was displaced within few days from last replacement.    Signed,  Dulcy Fanny. Earleen Newport, DO

## 2018-11-03 NOTE — Progress Notes (Signed)
PROGRESS NOTE  Logan Price:[id number] DOB: 1945-09-23 DOA: 09/27/2018 PCP: [name], FNP  Brief History   73 year old man admitted 7/3 for evaluation of large ulcerative mass at her right piriform sinus with extension obstructing laryngeal opening, he underwent awake tracheostomy and [employer] laryngoscopy and biopsy urgently 7/3.  Neck CT showed extensive laryngeal/pharyngeal mass, pathology subsequently revealed squamous cell carcinoma.  Patient was seen by oncology with recommendation for PEG tube placement and port placement to expedite outpatient therapy.  PET scan also planned for the outpatient setting.  TRH was consulted 7/9 for PSVT and treated with adenosine with successful conversion to sinus rhythm.  Port-A-Cath was placed 7/10 but interventional radiology was unable to place G-tube.  G-tube was placed by general surgery.  Plans were made for discharge 7/15 but the patient developed nausea, vomiting and diarrhea and tachycardia.  He developed increased secretions and was started on Levaquin 7/17.  7/18 he became hypotensive and blood culture was noted to be positive for Pseudomonas.  Had recurrent PSVT was seen by cardiology with recommendation for oral beta-blocker.  He was seen by infectious disease with recommendation for removal of port.  Port was removed with findings suggestive of infected hematoma so the wound was left open.  Because of this, oncology recommended proceeding with radiation therapy alone at this point.  Develop recurrent urinary retention 7/20 requiring replacement of Foley catheter.  Patient was transferred to [facility] long 7/22 for initiation of radiation therapy.  Developed shortness of breath 7/24, x-ray showed right basilar infiltrate and right pleural effusion, underwent thoracentesis.  Patient was treated for pneumonia.  He completed antibiotic therapy.  Plans were made for transfer to SNF prior to transfer PEG tube fell out and was replaced 8/5.  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  Started XRT 7/28. Status post trach placement 7/3 and G-tube placement 7/13. Speech therapy evaluation recommended n.p.o., high risk for aspiration. Placed on scopolamine for increased respiratory secretions --Continue trach care and suction --Outpatient PET scan planned --Remains stable for discharge  Severe protein calorie malnutrition, on tube feeds with associated diarrhea --Continue tube feeds.  PEG tube fell out 8/4 and 8/8 and was replaced 8/5 and 8/9. --Follow-up with Dr. Sheliah Hatch in the outpatient setting  Status post Pseudomonas bacteremia secondary to Port-A-Cath, tracheitis --Port-A-Cath removed 7/20, suspected to have infected hematoma --Infectious disease recommended IV cefepime total 2 weeks status post Port-A-Cath removal.  Finished course 8/4 --Repeat blood cultures negative --Interventional radiology recommended continuing dry dressing changes as needed.  Continue dressing changes as needed.  Acute kidney injury secondary to acute urinary retention earlier on hospitalization.  Renal function improved after Foley catheter placement.  Failed previous voiding trial and Foley catheter had to be replaced. --Outpatient neurology follow-up --Continue doxazosin (Flomax cannot be given through G-tube)  Paroxysmal SVT, reentry tachycardia, intermittent and recurrent, last episode 7/26.  Patient seen by cardiology.  2D echocardiogram unremarkable and TSH within normal limits. --Keep potassium around 4 magnesium above 2 --Follow-up with Dr. Purvis Sheffield as outpatient.  Cardiology recommended metoprolol 25 mg QID but this was decreased to twice daily because of hypotension.  He has been tolerating 3 times daily without hypotension or breakthrough SVT. --Continue metoprolol.  Transitive pleural effusions, pneumonia  --Status post ultrasound-guided thoracentesis 7/25 with 450 mL removed --Patient treated with IV antibiotics.  Acute issues appear resolved.  Essential hypertension --Stable.  Bilateral upper lobe solid pulmonary nodules, largest 6 mm, all stable since 02/22/2018 neck CT, more likely benign. Suggest follow-up chest CT in 12 months to demonstrate continued stability.  Aortic Atherosclerosis  --No inpatient treatment suggested  Emphysema by chest CT   Remains stable for transfer to SNF  DVT prophylaxis: heparin Code Status: Full Family Communication: none Disposition Plan: SNF?   Brendia Sacks, MD  [facility] Direct contact: see [email or web address] (further directions at bottom of note if needed) 7PM-7AM contact night coverage as at bottom of note 11/03/2018, 1:25 PM  LOS: 37 days   Consultants:    Admitted by ENT 7/3   Oncology: Dr. Dion Body 09/30/2018  Interventional radiology: Dr. Ardelle Anton 10/02/2018  [facility] hospitalist: Dr. Julian Reil 10/03/2018  General surgery: Dr. Luisa Hart 10/06/2018  Dental surgery: Dr. Valentino Hue 10/08/2018  Cardiology: Dr. Wyline Mood 10/13/2018  Infectious disease: Dr. Daiva Eves 10/13/2018  Radiation oncology  Procedures:   Port-A-Cath placement 7/10  7/13  laparoscopic placement of gastrostomy by T bars and push technique  Port-A-Cath removal per interventional radiology 10/14/2018  Ultrasound-guided thoracentesis 10/19/2018 per Brayton El, PA--- 450 cc of clear pale yellow fluid removed.  PEG tube replacement 8/9  Echo IMPRESSIONS    1. The left ventricle has normal systolic function with an ejection fraction of 60-65%. The cavity size was normal. Left ventricular diastolic parameters were normal.  2. The right ventricle has normal systolic function. The cavity was normal. There is no increase in right ventricular wall thickness. Right ventricular systolic pressure could not be assessed.  3. Small pericardial effusion.  4. The pericardial effusion  is anterior to the right ventricle.  5. The mitral valve is grossly normal. No evidence of mitral valve stenosis.  6. The aortic valve is tricuspid. No stenosis of the aortic valve.  7. The aortic root and descending aorta are normal in size and structure.  SUMMARY   Normal LV EF. Small pericardial effusion localized anterior to RV, no echo evidence of tamponade. On Image 95, may have fibrin/adherent clot on RV surface within pericardial space (otherwise not well visualized).  Antibiotics  .   Interval History/Subjective  Patient accidentally removed PEG tube last night.  Foley catheter was inserted and the PEG tube has been replaced today. Patient denies complaints.  Objective   Vitals:  Vitals:   11/03/18 0844 11/03/18 1143  BP:    Pulse: 84 99  Resp:  19  Temp:    SpO2: 96% 98%    Exam: Constitutional:   . Appears calm and comfortable Respiratory:  . CTA bilaterally, no w/r/r.  . Respiratory effort normal.  Cardiovascular:  . RRR, no m/r/g . No LE extremity edema   Psychiatric:  . Mental status o Mood, affect appropriate  I have personally reviewed the following:   Today's Data  . Urine output 1400.  Note I/O's not accurate. . CBG remains stable  Lab Data  . SARS-CoV-2 8/3 . Labs since admission reviewed  Micro Data  . Reviewed  Imaging  . Reviewed  Cardiology Data  . Reviewed  Other Data  .   Scheduled Meds: . chlorhexidine  15 mL Mouth Rinse BID  . doxazosin  1 mg Per Tube Daily  . feeding supplement (PRO-STAT SUGAR FREE 64)  30 mL Per Tube BID  . free water  200 mL Per Tube Q4H  . heparin injection (subcutaneous)  5,000 Units Subcutaneous Q8H  . hydrocortisone   Rectal QID  . lidocaine  1 application Urethral Once  .  mouth rinse  15 mL Mouth Rinse q12n4p  . metoprolol tartrate  50 mg Per Tube TID  . pantoprazole sodium  40 mg Per Tube Daily  . scopolamine  1 patch Transdermal Q72H  . simethicone  160 mg Per Tube QID  . simvastatin   20 mg Per Tube q1800   Continuous Infusions: . sodium chloride Stopped (10/27/18 1603)  . Molli Posey Peptide 1.5 325 mL (11/02/18 2238)  . lactated ringers 10 mL/hr at 10/07/18 0859  . sodium chloride 500 mL/hr at 10/14/18 2049    Principal Problem:   Squamous cell carcinoma of glottis (HCC) Active Problems:   Goals of care, counseling/discussion   Dysphagia   Loss of weight   PSVT (paroxysmal supraventricular tachycardia) (HCC)   Protein-calorie malnutrition, severe   Bacteremia   Leukocytosis   Normocytic anemia   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HCC)   Pleural effusion   Shortness of breath   LOS: 37 days   How to contact the [facility] Attending or Consulting provider 7A - 7P or covering provider during after hours 7P -7A, for this patient?  1. Check the care team in [facility] and look for a) attending/consulting TRH provider listed and b) the [facility] team listed 2. Log into [email or web address] and use [place]'s universal password to access. If you do not have the password, please contact the hospital operator. 3. Locate the [facility] provider you are looking for under [facility] and page to a number that you can be directly reached. 4. If you still have difficulty reaching the provider, please page the [facility] (Director on Call) for the Hospitalists listed on amion for assistance.

## 2018-11-03 NOTE — Plan of Care (Signed)
PEG tube replaced today (8/9) after accidental dislodgement last night.

## 2018-11-03 NOTE — Progress Notes (Signed)
Placed PT on new trach collar setup- uneventful.

## 2018-11-03 NOTE — Progress Notes (Signed)
This RN attempted to place 18 french foley into tract of PEG pulled out approximately 2 a.m. Unable to pass 18 french, was able to insert 14 french foley.  MD notified.

## 2018-11-03 NOTE — Progress Notes (Signed)
The patient notified the RN to come to the patient's room. The patient showed the RN that the PEG tube had come out of his abdomen. PCP was notified

## 2018-11-03 NOTE — Progress Notes (Signed)
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 PMH includes: HLD, HTN, hepatitis C, GERD, diverticulitis    PT Comments    Pt not motivated to work with PT today, states he is tired. RN reports pt has had a very flat affect all weekend.  pt amb in hallway last PT visit and PT recommended HHPT, looks like plan is for SNF for trach and Gtube management (?).  Difficult to tell from this limited treatment if pt is weaker than last PT visit.  Pt states he has not been amb or up other than to [facility] since last PT visit. Pt reports he feels he can still amb a significant distance he just doesn't want to at this time. Encouraged LE exercises.  Will continue to follow in acute setting, would benefit from PT eval at SNF if that is the plan.    Follow Up Recommendations  Other (comment)(plan is for SNF ?for trach/Gtube management)     Equipment Recommendations  Rolling walker with 5" wheels    Recommendations for Other Services       Precautions / Restrictions Precautions Precautions: Fall Precaution Comments: trach and Gtube Restrictions Weight Bearing Restrictions: No    Mobility  Bed Mobility Overal bed mobility: Modified Independent                Transfers Overall transfer level: Needs assistance   Transfers: Stand Pivot Transfers;Sit to/from Stand Sit to Stand: Supervision Stand pivot transfers: Min guard       General transfer comment: pt on [facility] with RN on arrival; able to stand with supervision,  min/guard for pivot back to bed (full 360*) without device  Ambulation/Gait             General Gait Details: pt refused    Stairs             Wheelchair Mobility    Modified Rankin (Stroke Patients Only)        Balance     Sitting balance-Leahy Scale: Good     Standing balance support: No upper extremity supported;Single extremity supported Standing balance-Leahy Scale: Fair Standing balance comment: needs UE support for dynamic activity                            Cognition Arousal/Alertness: Awake/alert Behavior During Therapy: Flat affect Overall Cognitive Status: Within Functional Limits for tasks assessed                                        Exercises General Exercises - Lower Extremity Ankle Circles/Pumps: AROM;Both;5 reps Quad Sets: AROM;Both;Limitations Quad Sets Limitations: unwilling to participate, 2-3 reps only    General Comments        Pertinent Vitals/Pain Pain Assessment: No/denies pain    Home Living                      Prior Function            PT Goals (current goals can now be found in the care plan section) Acute Rehab PT Goals Patient Stated Goal: to take care of himself PT Goal Formulation:  With patient Time For Goal Achievement: 11/14/18 Potential to Achieve Goals: Good Progress towards PT goals: Not progressing toward goals - comment(decr participation)    Frequency    Min 2X/week      PT Plan Current plan remains appropriate;Frequency needs to be updated    Co-evaluation              AM-PAC PT "6 Clicks" Mobility   Outcome Measure  Help needed turning from your back to your side while in a flat bed without using bedrails?: None Help needed moving from lying on your back to sitting on the side of a flat bed without using bedrails?: None Help needed moving to and from a bed to a chair (including a wheelchair)?: A Little Help needed standing up from a chair using your arms (e.g., wheelchair or bedside chair)?: A Little Help needed to walk in hospital room?: A Little Help needed climbing 3-5 steps with a railing? : A Little 6 Click Score: 20    End of Session Equipment Utilized During  Treatment: Other (comment);Oxygen(Trach collar) Activity Tolerance: Patient limited by fatigue Patient left: in bed;with call bell/phone within reach Nurse Communication: Mobility status PT Visit Diagnosis: Other abnormalities of gait and mobility (R26.89);Muscle weakness (generalized) (M62.81)     Time: 6237-6283 PT Time Calculation (min) (ACUTE ONLY): 13 min  Charges:  $Therapeutic Activity: 8-22 mins                     Drucilla Chalet, PT  Pager: [phone] Acute Rehab Dept [facility] [phone]   11/03/2018    [facility] 11/03/2018, 3:29 PM

## 2018-11-03 NOTE — Plan of Care (Signed)
Patient performs oral suctioning as needed

## 2018-11-03 NOTE — Progress Notes (Signed)
PT appears to be sleeping at this time- vitals documented.

## 2018-11-04 ENCOUNTER — Inpatient Hospital Stay (HOSPITAL_COMMUNITY): Payer: Medicare Other

## 2018-11-04 ENCOUNTER — Ambulatory Visit
Admit: 2018-11-04 | Discharge: 2018-11-04 | Disposition: A | Discharge status: Home / Self Care | Payer: Medicare Other | Attending: Radiation Oncology | Admitting: Radiation Oncology

## 2018-11-04 LAB — GLUCOSE, CAPILLARY
Glucose-Capillary: 103 mg/dL — ABNORMAL HIGH (ref 70–99)
Glucose-Capillary: 107 mg/dL — ABNORMAL HIGH (ref 70–99)
Glucose-Capillary: 121 mg/dL — ABNORMAL HIGH (ref 70–99)
Glucose-Capillary: 128 mg/dL — ABNORMAL HIGH (ref 70–99)
Glucose-Capillary: 129 mg/dL — ABNORMAL HIGH (ref 70–99)
Glucose-Capillary: 94 mg/dL (ref 70–99)

## 2018-11-04 NOTE — Progress Notes (Signed)
PROGRESS NOTE  Logan Price YQI:[id number] DOB: 1945-09-05 DOA: 09/27/2018 PCP: [name], FNP  Brief History   73 year old man admitted 7/3 for evaluation of large ulcerative mass at her right piriform sinus with extension obstructing laryngeal opening, he underwent awake tracheostomy and [employer] laryngoscopy and biopsy urgently 7/3.  Neck CT showed extensive laryngeal/pharyngeal mass, pathology subsequently revealed squamous cell carcinoma.  Patient was seen by oncology with recommendation for PEG tube placement and port placement to expedite outpatient therapy.  PET scan also planned for the outpatient setting.  TRH was consulted 7/9 for PSVT and treated with adenosine with successful conversion to sinus rhythm.  Port-A-Cath was placed 7/10 but interventional radiology was unable to place G-tube.  G-tube was placed by general surgery.  Plans were made for discharge 7/15 but the patient developed nausea, vomiting and diarrhea and tachycardia.  He developed increased secretions and was started on Levaquin 7/17.  7/18 he became hypotensive and blood culture was noted to be positive for Pseudomonas.  Had recurrent PSVT was seen by cardiology with recommendation for oral beta-blocker.  He was seen by infectious disease with recommendation for removal of port.  Port was removed with findings suggestive of infected hematoma so the wound was left open.  Because of this, oncology recommended proceeding with radiation therapy alone at this point.  Develop recurrent urinary retention 7/20 requiring replacement of Foley catheter.  Patient was transferred to [facility] long 7/22 for initiation of radiation therapy.  Developed shortness of breath 7/24, x-ray showed right basilar infiltrate and right pleural effusion, underwent thoracentesis.  Patient was treated for pneumonia.  He completed antibiotic therapy.  Plans were made for transfer to SNF prior to transfer PEG tube fell out and was replaced 8/5.  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  Started XRT 7/28. Status post trach placement 7/3 and G-tube placement 7/13. Speech therapy evaluation recommended n.p.o., high risk for aspiration. Placed on scopolamine for increased respiratory secretions --Continue trach care and suction --Outpatient PET scan planned --Remains stable for discharge  Severe protein calorie malnutrition, on tube feeds with associated diarrhea --Continue tube feeds.  PEG tube fell out 8/4 and 8/8 and was replaced 8/5 and 8/9. --Follow-up with Dr. Sheliah Hatch in the outpatient setting  Status post Pseudomonas bacteremia secondary to Port-A-Cath, tracheitis --Port-A-Cath removed 7/20, suspected to have infected hematoma --Infectious disease recommended IV cefepime total 2 weeks status post Port-A-Cath removal.  Finished course 8/4 --Repeat blood cultures negative --Interventional radiology recommended continuing dry dressing changes as needed.  Continue dressing changes as needed.  Acute kidney injury secondary to acute urinary retention earlier on hospitalization.  Renal function improved after Foley catheter placement.  Failed previous voiding trial and Foley catheter had to be replaced. --Outpatient urology follow-up --Continue doxazosin (Flomax cannot be given through G-tube)  Paroxysmal SVT, reentry tachycardia, intermittent and recurrent, last episode 7/26.  Patient seen by cardiology.  2D echocardiogram unremarkable and TSH within normal limits. --Keep potassium around 4 magnesium above 2 --Follow-up with Dr. Purvis Sheffield as outpatient.  Cardiology recommended metoprolol 25 mg QID but this was decreased to twice daily because of hypotension.  He has been tolerating 3 times daily without hypotension or breakthrough SVT. --Continue metoprolol.  Transitive pleural effusions, pneumonia  --Status post ultrasound-guided thoracentesis 7/25 with 450 mL removed --Patient treated with IV antibiotics.  Acute issues appear resolved.  Essential hypertension --Remained  stable.  Bilateral upper lobe solid pulmonary nodules, largest 6 mm, all stable since 02/22/2018 neck CT, more likely benign. Suggest follow-up chest CT in 12 months to demonstrate continued stability.  Aortic Atherosclerosis  --No inpatient treatment suggested  Emphysema by chest CT   Remains stable for transfer to SNF  DVT prophylaxis: heparin Code Status: Full Family Communication: none Disposition Plan: SNF?   Brendia Sacks, MD  [facility] Direct contact: see [email or web address] (further directions at bottom of note if needed) 7PM-7AM contact night coverage as at bottom of note 11/04/2018, 11:57 AM  LOS: 38 days   Consultants:    Admitted by ENT 7/3   Oncology: Dr. Dion Body 09/30/2018  Interventional radiology: Dr. Ardelle Anton 10/02/2018  [facility] hospitalist: Dr. Julian Reil 10/03/2018  General surgery: Dr. Luisa Hart 10/06/2018  Dental surgery: Dr. Valentino Hue 10/08/2018  Cardiology: Dr. Wyline Mood 10/13/2018  Infectious disease: Dr. Daiva Eves 10/13/2018  Radiation oncology  Procedures:   Port-A-Cath placement 7/10  7/13  laparoscopic placement of gastrostomy by T bars and push technique  Port-A-Cath removal per interventional radiology 10/14/2018  Ultrasound-guided thoracentesis 10/19/2018 per Brayton El, PA--- 450 cc of clear pale yellow fluid removed.  PEG tube replacement 8/9  Echo IMPRESSIONS    1. The left ventricle has normal systolic function with an ejection fraction of 60-65%. The cavity size was normal. Left ventricular diastolic parameters were normal.  2. The right ventricle has normal systolic function. The cavity was normal. There is no increase in right ventricular wall thickness. Right ventricular systolic pressure could not be assessed.  3. Small pericardial effusion.  4. The  pericardial effusion is anterior to the right ventricle.  5. The mitral valve is grossly normal. No evidence of mitral valve stenosis.  6. The aortic valve is tricuspid. No stenosis of the aortic valve.  7. The aortic root and descending aorta are normal in size and structure.  SUMMARY   Normal LV EF. Small pericardial effusion localized anterior to RV, no echo evidence of tamponade. On Image 95, may have fibrin/adherent clot on RV surface within pericardial space (otherwise not well visualized).  Antibiotics  .   Interval History/Subjective  No new issues overnight.  Patient without complaint.  Objective   Vitals:  Vitals:   11/04/18 0319 11/04/18 0424  BP:  (!) 115/57  Pulse: 90 88  Resp: 18 14  Temp:  98.2 F (36.8 C)  SpO2: 98% 95%    Exam: Constitutional:   . Appears calm and comfortable Respiratory:  . CTA bilaterally, no w/r/r.  . Respiratory effort normal.  Cardiovascular:  . RRR, no m/r/g Abdomen:  . Soft.  PEG tube appears in place. Psychiatric:  . Mental status o Mood, affect appropriate  I have personally reviewed the following:   Today's Data  . Urine output 925.  Note I/O's not accurate. . CBG remains stable  Lab Data  . SARS-CoV-2 8/3 . Labs since admission reviewed  Micro Data  . Reviewed  Imaging  . Reviewed  Cardiology Data  . Reviewed  Other Data  .   Scheduled Meds: . chlorhexidine  15 mL Mouth Rinse BID  . doxazosin  1 mg Per Tube Daily  . feeding supplement (PRO-STAT SUGAR FREE 64)  30 mL Per Tube BID  . free water  200 mL Per Tube Q4H  . heparin injection (subcutaneous)  5,000 Units Subcutaneous Q8H  . hydrocortisone   Rectal QID  . lidocaine  1 application Urethral Once  . mouth rinse  15 mL Mouth  Rinse q12n4p  . metoprolol tartrate  50 mg Per Tube TID  . pantoprazole sodium  40 mg Per Tube Daily  . scopolamine  1 patch Transdermal Q72H  . simethicone  160 mg Per Tube QID  . simvastatin  20 mg Per Tube q1800    Continuous Infusions: . sodium chloride Stopped (10/27/18 1603)  . Molli Posey Peptide 1.5 325 mL (11/04/18 0917)  . lactated ringers 10 mL/hr at 10/07/18 0859  . sodium chloride 500 mL/hr at 10/14/18 2049    Principal Problem:   Squamous cell carcinoma of glottis (HCC) Active Problems:   Goals of care, counseling/discussion   Dysphagia   Loss of weight   PSVT (paroxysmal supraventricular tachycardia) (HCC)   Protein-calorie malnutrition, severe   Bacteremia   Leukocytosis   Normocytic anemia   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HCC)   Pleural effusion   Shortness of breath   LOS: 38 days   How to contact the [facility] Attending or Consulting provider 7A - 7P or covering provider during after hours 7P -7A, for this patient?  1. Check the care team in [facility] and look for a) attending/consulting TRH provider listed and b) the [facility] team listed 2. Log into [email or web address] and use [place]'s universal password to access. If you do not have the password, please contact the hospital operator. 3. Locate the [facility], The provider you are looking for under [facility] and page to a number that you can be directly reached. 4. If you still have difficulty reaching the provider, please page the [facility] (Director on Call) for the Hospitalists listed on amion for assistance.

## 2018-11-04 NOTE — Progress Notes (Signed)
[facility] Therapy Treatment Record Phone [phone]   Radiation Therapy was administered to Logan Price on: 11/04/2018  2:59 PM and was treatment # 10 out of a planned course of 35 treatments.  Radiation Treatment  1). Beam photons with 6-10 energy and Photons 6-10 MeV  2). Brachytherapy None  3). Stereotactic Radiosurgery None  4). Other Radiation None     Para [place]

## 2018-11-04 NOTE — Progress Notes (Addendum)
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 PMH includes: HLD, HTN, hepatitis C, GERD, diverticulitis    PT Comments    Patient was agreeable to participate in ambulation and exercises this date for therapy. He declined to put speaking valve in place due to increased difficulty breathing with valve.  Patient continues to require supervision for transfers and gait with RW with occasional cues to increase proximity to walker. Towards end of ambulation pt noted to have increased knee flexion during stance possibly indicating greater weakness since prior sessions or pt may have been fatigued. Pt educated on seated exercises at EOB and encouraged to perform periodically throughout the day. Will continue to progress mobility as able.   Follow Up Recommendations  Other (comment)(plan is for SNF ?for trach/Gtube management)     Equipment Recommendations  Rolling walker with 5" wheels    Recommendations for Other Services       Precautions / Restrictions Precautions Precautions: Fall Precaution Comments: trach and Gtube Restrictions Weight Bearing Restrictions: No    Mobility  Bed Mobility Overal bed mobility: Modified Independent             General bed mobility comments: HOB elevated ~30*, used rail  Transfers Overall transfer level: Needs assistance Equipment used: Rolling walker (2 wheeled) Transfers: Sit to/from Stand Sit to Stand: Supervision         General transfer comment: pt demonstrated safe sit to stand transfer technique with RW from EOB  Ambulation/Gait Ambulation/Gait assistance: Supervision;Min guard Gait Distance (Feet): 400 Feet Assistive device: Rolling walker (2 wheeled) Gait Pattern/deviations:  Step-through pattern;Trunk flexed Gait velocity: WNL   General Gait Details: cues to maintain safe proximety to RW throughout gait, pt with Rt knee flexing at end of ambulation, no buckling but pt with greater difficulty standing upright and extending knees fully to maintain upright posture during stance phase of gait   Stairs             Wheelchair Mobility    Modified Rankin (Stroke Patients Only)       Balance Overall balance assessment: Needs assistance Sitting-balance support: No upper extremity supported Sitting balance-Leahy Scale: Good     Standing balance support: No upper extremity supported;Single extremity supported Standing balance-Leahy Scale: Fair Standing balance comment: needs UE support for dynamic activity such as gait       Cognition Arousal/Alertness: Awake/alert Behavior During Therapy: WFL for tasks assessed/performed Overall Cognitive Status: Within Functional Limits for tasks assessed             Exercises General Exercises - Lower Extremity Long Arc Quad: AROM;Both;Seated;10 reps Hip Flexion/Marching: AROM;Both;Seated;10 reps Toe Raises: AROM;Both;10 reps;Seated Heel Raises: AROM;Both;10 reps;Seated        Pertinent Vitals/Pain Pain Assessment: No/denies pain           PT Goals (current goals can now be found in the care plan section) Acute Rehab PT Goals Patient Stated Goal: to take care of himself PT Goal Formulation: With patient Time For Goal Achievement: 11/14/18 Potential to Achieve Goals: Good Progress towards PT goals: Progressing toward goals(pt was participatory today but has history of decreased participation in therapy, limits his progress towards goals)    Frequency    Min 2X/week      PT Plan Current  plan remains appropriate;Frequency needs to be updated       AM-PAC PT "6 Clicks" Mobility   Outcome Measure  Help needed turning from your back to your side while in a flat bed without using bedrails?:  None Help needed moving from lying on your back to sitting on the side of a flat bed without using bedrails?: None Help needed moving to and from a bed to a chair (including a wheelchair)?: A Little Help needed standing up from a chair using your arms (e.g., wheelchair or bedside chair)?: A Little Help needed to walk in hospital room?: A Little Help needed climbing 3-5 steps with a railing? : A Little 6 Click Score: 20    End of Session Equipment Utilized During Treatment: Other (comment);Oxygen(Trach collar) Activity Tolerance: Patient tolerated treatment well Patient left: in bed;with call bell/phone within reach;with bed alarm set Nurse Communication: Mobility status PT Visit Diagnosis: Other abnormalities of gait and mobility (R26.89);Muscle weakness (generalized) (M62.81)     Time: 4259-5638 PT Time Calculation (min) (ACUTE ONLY): 25 min  Charges:  $Gait Training: 8-22 mins $Therapeutic Exercise: 8-22 mins                     Valentino Saxon, PT, DPT, [facility] Physical Therapist with [facility]  11/04/2018 1:17 PM

## 2018-11-04 NOTE — TOC Progression Note (Signed)
Transition of Care [facility]) - Progression Note    Patient Details  Name: Logan Price MRN: [id number] Date of Birth: 03/08/1946  [facility] CM/SW Contact  Purcell Mouton, RN Phone Number: 11/04/2018, 4:49 PM  Clinical Narrative:    Spoke with pt, wife at bedside and pt's brother on speaker phone concerning discharge plans home with [facility] vs SNF. Unable to find a SNF that will take him. According to staff RN pt is able to do some on his own. Pt states that he can also take care of suctioning and dressing if taught. Checking on [facility] agencies and will check [facility] and [facility].    Expected Discharge Plan: [place] Barriers to Discharge: Continued Medical Work up  Expected Discharge Plan and Services Expected Discharge Plan: [place]   Discharge Planning Services: CM Consult Post Acute Care Choice: Home Health   Expected Discharge Date: 10/10/18               DME Arranged: Tube feeding, Tube feeding pump, Trach supplies DME Agency: [employer] Date DME Agency Contacted: 10/10/18 Time DME Agency Contacted: 1000 Representative spoke with at DME Agency: Thedore Mins HH Arranged: RN, PT, OT, Nurse's Aide, Social Work [employer] Agency: [place] ([place]) Date [place]: 10/03/18 Time [place]: 1152 Representative spoke with at [place]: [place] ([place]) Interventions    Readmission Risk Interventions Readmission Risk Prevention Plan 10/28/2018  Transportation Screening Complete  PCP or Specialist Appt within 3-5 Days Complete  HRI or [place] Not Complete  HRI or Home Care Consult comments dc to [facility]  Social Work Consult for [place] Planning/Counseling Complete  Palliative Care Screening Not Applicable  Medication Review Press photographer) Complete  Some recent data might be hidden

## 2018-11-05 ENCOUNTER — Ambulatory Visit
Admit: 2018-11-05 | Discharge: 2018-11-05 | Disposition: A | Discharge status: Home / Self Care | Payer: Medicare Other | Attending: Radiation Oncology | Admitting: Radiation Oncology

## 2018-11-05 ENCOUNTER — Encounter (HOSPITAL_COMMUNITY): Payer: Self-pay | Admitting: Interventional Radiology

## 2018-11-05 LAB — GLUCOSE, CAPILLARY
Glucose-Capillary: 108 mg/dL — ABNORMAL HIGH (ref 70–99)
Glucose-Capillary: 117 mg/dL — ABNORMAL HIGH (ref 70–99)
Glucose-Capillary: 119 mg/dL — ABNORMAL HIGH (ref 70–99)
Glucose-Capillary: 119 mg/dL — ABNORMAL HIGH (ref 70–99)
Glucose-Capillary: 127 mg/dL — ABNORMAL HIGH (ref 70–99)
Glucose-Capillary: 127 mg/dL — ABNORMAL HIGH (ref 70–99)

## 2018-11-05 NOTE — Progress Notes (Signed)
SLP Cancellation Note  Patient Details Name: Logan Price MRN: [id number] DOB: 1945-10-16   Cancelled treatment:       Reason Eval/Treat Not Completed: Fatigue/lethargy limiting ability to participate(2nd attempt to see pt, first attempt going to radiation, 2nd attempt sleepy soundly; will continue efforts)   Macario Golds 11/05/2018, 4:17 PM  Luanna Salk, [place] [facility] [facility] SLP [place] Pager [phone] Office [phone]

## 2018-11-05 NOTE — Progress Notes (Addendum)
PROGRESS NOTE  Logan Price WUJ:[id number] DOB: 1945-04-22 DOA: 09/27/2018 PCP: [name], FNP  Brief History   73 year old man admitted 7/3 for evaluation of large ulcerative mass at her right piriform sinus with extension obstructing laryngeal opening, he underwent awake tracheostomy and [employer] laryngoscopy and biopsy urgently 7/3.  Neck CT showed extensive laryngeal/pharyngeal mass, pathology subsequently revealed squamous cell carcinoma.  Patient was seen by oncology with recommendation for PEG tube placement and port placement to expedite outpatient therapy.  PET scan also planned for the outpatient setting.  TRH was consulted 7/9 for PSVT and treated with adenosine with successful conversion to sinus rhythm.  Port-A-Cath was placed 7/10 but interventional radiology was unable to place G-tube.  G-tube was placed by general surgery.  Plans were made for discharge 7/15 but the patient developed nausea, vomiting and diarrhea and tachycardia.  He developed increased secretions and was started on Levaquin 7/17.  7/18 he became hypotensive and blood culture was noted to be positive for Pseudomonas.  Had recurrent PSVT was seen by cardiology with recommendation for oral beta-blocker.  He was seen by infectious disease with recommendation for removal of port.  Port was removed with findings suggestive of infected hematoma so the wound was left open.  Because of this, oncology recommended proceeding with radiation therapy alone at this point.  Develop recurrent urinary retention 7/20 requiring replacement of Foley catheter.  Patient was transferred to [facility] long 7/22 for initiation of radiation therapy.  Developed shortness of breath 7/24, x-ray showed right basilar infiltrate and right pleural effusion, underwent thoracentesis.  Patient was treated for pneumonia.  He completed antibiotic therapy.  Plans were made for transfer to SNF prior to transfer PEG tube fell out and was replaced 8/5.   Hospitalization has been prolonged by difficulty in securing an SNF facility that can meet his needs--given inability to secure SNF and patient's improvement, plans have been made for home health.  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  Started XRT 7/28. Status post trach placement 7/3 and G-tube placement 7/13. Speech therapy evaluation recommended n.p.o., high risk for aspiration. Placed on scopolamine for increased respiratory secretions --Continue trach care and suction --Outpatient PET scan per oncology --Remains stable for discharge  Severe protein calorie malnutrition, on tube feeds with associated diarrhea --Continue tube feeds.  PEG tube fell out 8/4 and 8/8 and was replaced 8/5 and 8/9. --Follow-up with Dr. Sheliah Hatch in the outpatient setting  Status post Pseudomonas bacteremia secondary to Port-A-Cath, tracheitis --Port-A-Cath removed 7/20, suspected to have infected hematoma --Infectious disease recommended IV cefepime total 2 weeks status post Port-A-Cath removal.  Finished course 8/4 --Repeat blood cultures negative --Interventional radiology recommended continuing dry dressing changes as needed.  Continue dressing changes as needed.  Acute kidney injury secondary to acute urinary retention earlier on hospitalization.  Renal function improved after Foley catheter placement.  Failed previous voiding trial and Foley catheter had to be replaced. --Outpatient urology follow-up --Continue doxazosin (Flomax cannot be given through G-tube)  Paroxysmal SVT, reentry tachycardia, intermittent and recurrent, last episode 7/26.  Patient seen by cardiology.  2D echocardiogram unremarkable and TSH within normal limits. --Keep potassium around 4 magnesium above 2 --Follow-up with Dr. Purvis Sheffield as outpatient.  Cardiology recommended metoprolol 25 mg QID  but this was decreased to twice daily because of hypotension.  He has been tolerating 3 times daily without hypotension or  breakthrough SVT. --Continue metoprolol.  Transitive pleural effusions, pneumonia --Status post ultrasound-guided thoracentesis 7/25 with 450 mL removed --Patient treated with IV antibiotics.  Acute issues appear resolved.  Essential hypertension --Stable  Bilateral upper lobe solid pulmonary nodules, largest 6 mm, all stable since 02/22/2018 neck CT, more likely benign. Suggest follow-up chest CT in 12 months to demonstrate continued stability.  Aortic Atherosclerosis  --No inpatient treatment suggested  Emphysema by chest CT   Remains stable for transfer to SNF  DVT prophylaxis: heparin Code Status: Full Family Communication: none Disposition Plan: SNF?   Brendia Sacks, MD  [facility] Direct contact: see [email or web address] (further directions at bottom of note if needed) 7PM-7AM contact night coverage as at bottom of note 11/05/2018, 11:13 AM  LOS: 39 days   Consultants:    Admitted by ENT 7/3   Oncology: Dr. Dion Body 09/30/2018  Interventional radiology: Dr. Ardelle Anton 10/02/2018  [facility] hospitalist: Dr. Julian Reil 10/03/2018  General surgery: Dr. Luisa Hart 10/06/2018  Dental surgery: Dr. Valentino Hue 10/08/2018  Cardiology: Dr. Wyline Mood 10/13/2018  Infectious disease: Dr. Daiva Eves 10/13/2018  Radiation oncology  Procedures:   Port-A-Cath placement 7/10  7/13  laparoscopic placement of gastrostomy by T bars and push technique  Port-A-Cath removal per interventional radiology 10/14/2018  Ultrasound-guided thoracentesis 10/19/2018 per Brayton El, PA--- 450 cc of clear pale yellow fluid removed.  PEG tube replacement 8/9  Echo IMPRESSIONS    1. The left ventricle has normal systolic function with an ejection fraction of 60-65%. The cavity size was normal. Left ventricular diastolic parameters were normal.  2. The right ventricle has normal systolic  function. The cavity was normal. There is no increase in right ventricular wall thickness. Right ventricular systolic pressure could not be assessed.  3. Small pericardial effusion.  4. The pericardial effusion is anterior to the right ventricle.  5. The mitral valve is grossly normal. No evidence of mitral valve stenosis.  6. The aortic valve is tricuspid. No stenosis of the aortic valve.  7. The aortic root and descending aorta are normal in size and structure.  SUMMARY   Normal LV EF. Small pericardial effusion localized anterior to RV, no echo evidence of tamponade. On Image 95, may have fibrin/adherent clot on RV surface within pericardial space (otherwise not well visualized).  Antibiotics     Interval History/Subjective  No new issues.  No complaints.  Objective   Vitals:  Vitals:   11/05/18 0424 11/05/18 0455  BP: 119/69   Pulse: 87 87  Resp: (!) 22 20  Temp: 98.6 F (37 C)   SpO2: 98% 99%    Exam: Constitutional:    Appears calm and comfortable Respiratory:   CTA bilaterally, no w/r/r.   Respiratory effort normal.  Cardiovascular:   RRR, no m/r/g  No LE extremity edema   Abdomen:   Soft Psychiatric:   Mental status o Mood, affect appropriate  I have personally reviewed the following:   Today's Data   Urine output 950  CBG stable  Lab Data   SARS-CoV-2 8/3  Labs since admission reviewed  Micro Data   Reviewed  Imaging   Reviewed  Cardiology Data   Reviewed  Other Data     Scheduled Meds:  chlorhexidine  15 mL Mouth Rinse BID   doxazosin  1 mg Per Tube Daily   feeding supplement (PRO-STAT SUGAR FREE 64)  30 mL Per Tube BID   free water  200 mL Per Tube Q4H   heparin injection (subcutaneous)  5,000 Units Subcutaneous  Q8H   hydrocortisone   Rectal QID   lidocaine  1 application Urethral Once   mouth rinse  15 mL Mouth Rinse q12n4p   metoprolol tartrate  50 mg Per Tube TID   pantoprazole sodium  40 mg Per  Tube Daily   scopolamine  1 patch Transdermal Q72H   simethicone  160 mg Per Tube QID   simvastatin  20 mg Per Tube q1800   Continuous Infusions:  sodium chloride Stopped (10/27/18 1603)   Molli Posey Peptide 1.5 325 mL (11/05/18 0535)   lactated ringers 10 mL/hr at 10/07/18 0859   sodium chloride 500 mL/hr at 10/14/18 2049    Principal Problem:   Squamous cell carcinoma of glottis (HCC) Active Problems:   Goals of care, counseling/discussion   Dysphagia   Loss of weight   PSVT (paroxysmal supraventricular tachycardia) (HCC)   Protein-calorie malnutrition, severe   Bacteremia   Leukocytosis   Normocytic anemia   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HCC)   Pleural effusion   Shortness of breath   LOS: 39 days   How to contact the [facility] Attending or Consulting provider 7A - 7P or covering provider during after hours 7P -7A, for this patient?  1. Check the care team in [facility] and look for a) attending/consulting TRH provider listed and b) the [facility] team listed 2. Log into [email or web address] and use 's universal password to access. If you do not have the password, please contact the hospital operator. 3. Locate the [facility] provider you are looking for under [facility] and page to a number that you can be directly reached. 4. If you still have difficulty reaching the provider, please page the [facility] (Director on Call) for the Hospitalists listed on amion for assistance.

## 2018-11-05 NOTE — Progress Notes (Signed)
[facility] Therapy Treatment Record Phone [phone]   Radiation Therapy was administered to Logan Price on: 11/05/2018  11:45 AM and was treatment # 11 out of a planned course of 35 treatments.  Radiation Treatment  1). Beam photons with 6-10 energy  2). Brachytherapy None  3). Stereotactic Radiosurgery None  4). Other Radiation None     Para [place]

## 2018-11-05 NOTE — Progress Notes (Signed)
Nutrition Follow-up  DOCUMENTATION CODES:   Severe malnutrition in context of chronic illness  INTERVENTION:   -[employer] Peptide 1.5 @ 60 ml/hr (4.5 cans/day via pump) with 30 ml prostat (or equivalent) BID and 200 ml free water every 4 hours. -This regimen provides 2418 kcal, 131 grams protein, 13 grams of fiber, and 1200 ml free water from flushes.   NUTRITION DIAGNOSIS:   Severe Malnutrition related to chronic illness, cancer and cancer related treatments as evidenced by percent weight loss, severe muscle depletion, moderate muscle depletion, mild fat depletion.  Ongoing.  GOAL:   Patient will meet greater than or equal to 90% of their needs  Meeting with TF.  MONITOR:   Labs, I & O's, Skin, TF tolerance, Weight trends  ASSESSMENT:   Patient with PMH significant for diverticulitis, gastritis, HLD, HTN, laryngeal cancer, and esophogeal dysphagia. 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  Significant Events: 7/3- admission; tracheostomy with laryngoscopy and biopsy 7/8- initial RD assessment 7/11- NGT placed and TF initiated (Osmolite 1.5) 7/13- NFPE completed and patient identified to meet criteria for severe malnutrition; G-tube placed 7/16- transitioned from continuous TF to bolus TF (Osomolite 1.5); imodium started 7/17- c.diff checked--negative 7/20- port removal by IR d/t pseudomonas bacteremia 7/22- transferred from [facility] to [place] for initiation of radiation; radiation simulation 7/23- changed TF regimen to half Jevity 1.5 and half Osmolite 1.5; increased water flush to 150 ml x6/day 7/25- thoracentesis with 450 ml removed 7/27- changed TF regimen to 1 can [employer] Peptide 1.5 x5/day with 150 ml free water x6/day 7/28- first radiation treatment 7/29- decreased from 5 TF boluses/day to 3 TF boluses/day d/t feelings of fullness 7/30- type 5 BM 8/4 -PEG tube fell out 8/5: PEG tube  replaced 8/8: PEG tube fell out again 8/9: PEG tube replaced  **RD working remotely**  Patient continues to receive and tolerate [employer] Peptide 1.5 @ 60 ml/hr w/ 30 ml Prostat BID.  Per MD, pt is stable for discharge to facility. Pt was expected to go [facility] last week.  Per case management notes, facilities either have no beds available or cannot take care of his tube feeding. [employer] representative can provide [employer] formula to the facility that the pt discharges to.  Will continue to monitor plans.  Medications reviewed. Labs reviewed: CBGs: 119-127  Diet Order:   Diet Order            Diet NPO time specified Except for: Sips with Meds  Diet effective midnight        Diet general              EDUCATION NEEDS:   Not appropriate for education at this time  Skin:  Skin Assessment: Skin Integrity Issues: Skin Integrity Issues:: Incisions, Other (Comment) Incisions: neck/abdomen, R chest (7/20) Other: new wound-sacrum  Last BM:  8/11  Height:   Ht Readings from Last 1 Encounters:  09/27/18 5\' 9"  (1.753 m)    Weight:   Wt Readings from Last 1 Encounters:  11/05/18 54.6 kg    Ideal Body Weight:  72.7 kg  BMI:  Body mass index is 17.78 kg/m.  Estimated Nutritional Needs:   Kcal:  2300-2500 kcal  Protein:  115-130 grams  Fluid:  >/= 2.3 L/day  Clayton Bibles, MS, RD, LDN [place] Dietitian Pager: [phone] After Hours Pager: [phone]

## 2018-11-05 NOTE — Progress Notes (Signed)
OT Cancellation Note  Patient Details Name: Logan Price MRN: [id number] DOB: October 10, 1945   Cancelled Treatment:    Reason Eval/Treat Not Completed: Other (comment). Checked on pt twice today; he was still sleeping at 8 am; could not return later in morning. Pt fatiqued/sleeping soundly after XRT. Will return ASAP and try to see him before XRT.  [place] 11/05/2018, 2:39 PM  Lesle Chris, OTR/L Acute Rehabilitation Services [phone] [facility] pager [phone] office 11/05/2018

## 2018-11-06 ENCOUNTER — Ambulatory Visit
Admit: 2018-11-06 | Discharge: 2018-11-06 | Disposition: A | Discharge status: Home / Self Care | Payer: Medicare Other | Attending: Radiation Oncology | Admitting: Radiation Oncology

## 2018-11-06 ENCOUNTER — Telehealth: Payer: Self-pay | Admitting: *Deleted

## 2018-11-06 LAB — GLUCOSE, CAPILLARY
Glucose-Capillary: 118 mg/dL — ABNORMAL HIGH (ref 70–99)
Glucose-Capillary: 118 mg/dL — ABNORMAL HIGH (ref 70–99)
Glucose-Capillary: 119 mg/dL — ABNORMAL HIGH (ref 70–99)
Glucose-Capillary: 120 mg/dL — ABNORMAL HIGH (ref 70–99)
Glucose-Capillary: 121 mg/dL — ABNORMAL HIGH (ref 70–99)
Glucose-Capillary: 134 mg/dL — ABNORMAL HIGH (ref 70–99)
Glucose-Capillary: 98 mg/dL (ref 70–99)

## 2018-11-06 NOTE — Progress Notes (Signed)
Trach education provided to family. Trach booklet provided. Suctioning, trach care, and when to call emergency response covered during session.

## 2018-11-06 NOTE — Progress Notes (Signed)
Family at bedside and respiratory therapy educated the family and trach care.  Family was able to verbalize and return demonstrate trach care. Stacey Drain

## 2018-11-06 NOTE — Plan of Care (Signed)
Pt communicating better with his trach.

## 2018-11-06 NOTE — TOC Progression Note (Signed)
Transition of Care [facility]) - Progression Note    Patient Details  Name: BARY LIMBACH MRN: [id number] Date of Birth: July 23, 1945  [name] CM/SW Contact  Purcell Mouton, RN Phone Number: 11/06/2018, 9:58 AM  Clinical Narrative:    Plan for pt to discharge home with [facility]. Will need wife and brother to come for trach teaching. Plan to discharge Thursday.    Expected Discharge Plan: [place] Barriers to Discharge: Continued Medical Work up  Expected Discharge Plan and Services Expected Discharge Plan: [place]   Discharge Planning Services: CM Consult Post Acute Care Choice: Home Health   Expected Discharge Date: 10/10/18               DME Arranged: Tube feeding, Tube feeding pump, Trach supplies DME Agency: [employer] Date DME Agency Contacted: 10/10/18 Time DME Agency Contacted: 1000 Representative spoke with at DME Agency: Thedore Mins HH Arranged: RN, PT, OT, Nurse's Aide, Social Work [employer] Agency: [place] ([place]) Date [place]: 10/03/18 Time [place]: 1152 Representative spoke with at [place]: [place] ([place]) Interventions    Readmission Risk Interventions Readmission Risk Prevention Plan 10/28/2018  Transportation Screening Complete  PCP or Specialist Appt within 3-5 Days Complete  HRI or Industry Not Complete  HRI or Home Care Consult comments dc to [facility]  Social Work Consult for [place] Planning/Counseling Complete  Palliative Care Screening Not Applicable  Medication Review Press photographer) Complete  Some recent data might be hidden

## 2018-11-06 NOTE — Progress Notes (Signed)
OT Cancellation Note  Patient Details Name: Logan Price MRN: [id number] DOB: 25-May-1945   Cancelled Treatment:    Reason Eval/Treat Not Completed: Patient at procedure or test/ unavailable  At XRT  [facility] 11/06/2018, 10:48 AM  Lesle Chris, OTR/L Acute Rehabilitation Services [phone] [facility] pager [phone] office 11/06/2018

## 2018-11-06 NOTE — Progress Notes (Signed)
[facility]                                                                              Patient Demographics  Logan Price, is a 73 y.o. male, DOB - Jul 20, 1945, WUJ:[id number]  Admit date - 09/27/2018   Admitting Physician [name], MD  Outpatient Primary MD for the patient is [name], FNP  Outpatient specialists:   LOS - 40  days   Medical records reviewed and are as summarized below:    No chief complaint on file.      Brief summary  73 year old with history of GERD, essential hypertension, hyperlipidemia who recently diagnosed neck mass concerning for malignancy was admitted to the hospital. Overnight patient went into SVT therefore medical team was consulted. Patient was given adenosine x1 which converted back to normal sinus rhythm. G-tube placed by surgery 7/13, tolerating tube feeds. Metoprolol was started via PEG tube. Hospitals course was later on complicated by Pseudomonas bacteremia secondary to infected Port-A-Cath. Patient was transferred to medicine service. Infectious disease team were consulted who recommended removing Port-A-Cath.    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  Currently on 7 weeks radiation cycles. -Status post trach placement and G-tube placement 7/13, currently on tube feeds -Speech therapy following, recommend n.p.o., high risk of aspiration -Continue scopolamine patch for increased respiratory secretions, suctioning -Completed IV cefepime on 8/4    Active problems Severe protein calorie malnutrition with dysphagia, PEG tube, associated diarrhea.   -Continue tube feeds, PEG tube was dislodged on 8/4 and 8/8 and was replaced on 8/5 and 8/9  respectively. -Follow-up with Dr. Sheliah Hatch in outpatient setting -For home orders: Peptide 1.5 at 60 cc an hour via pump 30 mL pro-stat over equal and twice daily, 200 cc free water every 4 hours   Pseudomonas bacteremia -Likely source Port-A-Cath, removed on 10/14/2018. -ID recommended IV cefepime for total 2 weeks post Port-A-Cath removal, finished course on 10/29/2018.  Repeat blood cultures negative -Continue dressing changes as needed  Acute kidney injury secondary to acute urinary retention -Creatinine 5.3 on 7/20, creatinine improved after placing Foley catheter, now stable -Patient had failed previous voiding trial and Foley catheter had to be replaced. -Outpatient neurology follow-up -Continue doxazosin (Flomax cannot be given through G-tube)   Paroxysmal SVT, intermittent and recurrent, reentry tachycardia -Seen by cardiology, 2D echo unremarkable, TSH WNL -Follow-up with Dr. Purvis Sheffield as outpatient, Continue metoprolol 25 mg 3 times daily (was recommended 4 times daily however patient could not tolerate it due to hypotension) -Keep potassium ~4, magnesium above 2  Transudative pleural effusion, pneumonia -Status post ultrasound-guided thoracentesis 7/25 with 450 cc removed -Patient has completed full course of IV cefepime for 2 weeks on 8/4 -Acute issues appear to be resolved  Essential hypertension Currently stable  Bilateral upper lobe solid pulmonary nodules - largest 6 mm, all stable since 02/22/2018 neck CT, more likely benign - follow-up chest  CT in 12 months to demonstrate continued stability.  Aortic atherosclerosis No inpatient treatment suggested  Emphysema by chest CT Currently stable  Code Status: Full CODE STATUS DVT Prophylaxis: Heparin subcu Family Communication: Discussed in detail with the patient, all imaging results, lab results explained to the patient    Disposition Plan: Difficult placement, case management assisting for disposition to  home with home health    Time Spent in minutes 25 minutes  Procedures:   Percutaneous placement of gastrostomy tube 10/04/2018 per Dr. Ardelle Anton interventional radiology  Port-A-Cath removal per interventional radiology 10/14/2018  Ultrasound-guided thoracentesis 10/19/2018 per Brayton El, PA--- 450 cc of clear pale yellow fluid removed.  PEG tube replacement 8/9  Echo  Consultants:         Admitted by ENT 7/3  Oncology: Dr. Dion Body 09/30/2018  Interventional radiology: Dr. Ardelle Anton 10/02/2018  [facility]: Dr. Julian Reil 10/03/2018  General surgery: Dr. Luisa Hart 10/06/2018  Dental surgery: Dr. Valentino Hue 10/08/2018  Cardiology: Dr. Wyline Mood 10/13/2018  Infectious disease: Dr. Daiva Eves 10/13/2018  Radiation oncology    Antimicrobials:   Anti-infectives (From admission, onward)   Start     Dose/Rate Route Frequency Ordered Stop   10/19/18 0400  vancomycin (VANCOCIN) IVPB 750 mg/150 ml premix  Status:  Discontinued     750 mg 150 mL/hr over 60 Minutes Intravenous Every 12 hours 10/18/18 1541 10/20/18 1006   10/18/18 2000  ceFEPIme (MAXIPIME) 2 g in sodium chloride 0.9 % 100 mL IVPB     2 g 200 mL/hr over 30 Minutes Intravenous Every 8 hours 10/18/18 1243 10/30/18 1015   10/18/18 1600  azithromycin (ZITHROMAX) 500 mg in sodium chloride 0.9 % 250 mL IVPB     500 mg 250 mL/hr over 60 Minutes Intravenous Every 24 hours 10/18/18 1438 10/22/18 2252   10/18/18 1600  vancomycin (VANCOCIN) 1,250 mg in sodium chloride 0.9 % 250 mL IVPB     1,250 mg 166.7 mL/hr over 90 Minutes Intravenous  Once 10/18/18 1516 10/19/18 0100   10/15/18 1145  ceFEPIme (MAXIPIME) 2 g in sodium chloride 0.9 % 100 mL IVPB  Status:  Discontinued     2 g 200 mL/hr over 30 Minutes Intravenous Every 12 hours 10/15/18 1135 10/18/18 1243   10/14/18 1100  ceFEPIme (MAXIPIME) 2 g in sodium chloride 0.9 % 100 mL IVPB  Status:  Discontinued     2 g 200 mL/hr over 30 Minutes Intravenous Every 24 hours 10/13/18 1248  10/15/18 1135   10/14/18 0000  ceFAZolin (ANCEF) IVPB 2g/100 mL premix     2 g 200 mL/hr over 30 Minutes Intravenous To Radiology 10/13/18 1216 10/14/18 0907   10/12/18 0445  ceFEPIme (MAXIPIME) 2 g in sodium chloride 0.9 % 100 mL IVPB  Status:  Discontinued     2 g 200 mL/hr over 30 Minutes Intravenous Every 12 hours 10/12/18 0439 10/13/18 1248   10/11/18 1100  levofloxacin (LEVAQUIN) IVPB 750 mg  Status:  Discontinued     750 mg 100 mL/hr over 90 Minutes Intravenous Every 24 hours 10/11/18 0956 10/12/18 0439   10/04/18 1330  ceFAZolin (ANCEF) powder 1 g  Status:  Discontinued     1 g Other To Surgery 10/04/18 1317 10/04/18 1454   10/04/18 1315  ceFAZolin (ANCEF) 2-4 GM/100ML-% IVPB    Note to Pharmacy: Unk Lightning  : cabinet override      10/04/18 1315 10/04/18 1317   10/03/18 0800  ceFAZolin (ANCEF) IVPB 2g/100 mL premix     2  g 200 mL/hr over 30 Minutes Intravenous To Radiology 10/02/18 0901 10/04/18 1346   09/27/18 1500  clindamycin (CLEOCIN) IVPB 600 mg     600 mg 100 mL/hr over 30 Minutes Intravenous Every 8 hours 09/27/18 1400 09/28/18 0644         Medications  Scheduled Meds:  chlorhexidine  15 mL Mouth Rinse BID   doxazosin  1 mg Per Tube Daily   feeding supplement (PRO-STAT SUGAR FREE 64)  30 mL Per Tube BID   free water  200 mL Per Tube Q4H   heparin injection (subcutaneous)  5,000 Units Subcutaneous Q8H   hydrocortisone   Rectal QID   lidocaine  1 application Urethral Once   mouth rinse  15 mL Mouth Rinse q12n4p   metoprolol tartrate  50 mg Per Tube TID   pantoprazole sodium  40 mg Per Tube Daily   scopolamine  1 patch Transdermal Q72H   simethicone  160 mg Per Tube QID   simvastatin  20 mg Per Tube q1800   Continuous Infusions:  sodium chloride Stopped (10/27/18 1603)   Molli Posey Peptide 1.5 325 mL (11/05/18 1830)   lactated ringers 10 mL/hr at 10/07/18 0859   sodium chloride 500 mL/hr at 10/14/18 2049   PRN Meds:.sodium  chloride, alum & mag hydroxide-simeth, hydrALAZINE, hydrocortisone, hydrocortisone cream, ipratropium-albuterol, lip balm, loperamide HCl, loratadine, Muscle Rub, ondansetron (ZOFRAN) IV, phenol, polyethylene glycol, polyvinyl alcohol, promethazine, senna-docusate, sodium chloride      Subjective:   Logan Price was seen and examined today.  No complaints per patient, no acute events overnight.  No fevers or chills.  BP stable.  No worsening shortness of breath.  No chest pain   Objective:   Vitals:   11/06/18 0445 11/06/18 0500 11/06/18 0759 11/06/18 1146  BP: 112/65     Pulse: 65  89 84  Resp: 18  18 18   Temp: 98.7 F (37.1 C)     TempSrc: Oral     SpO2: 97%  94% 98%  Weight:  53.4 kg    Height:        Intake/Output Summary (Last 24 hours) at 11/06/2018 1230 Last data filed at 11/06/2018 0910 Gross per 24 hour  Intake 1800 ml  Output 1125 ml  Net 675 ml     Wt Readings from Last 3 Encounters:  11/06/18 53.4 kg  09/11/18 63.2 kg  04/25/18 70.4 kg   Physical Exam  General: Alert and oriented x 3, NAD  Eyes:   HEENT: Trach +  Cardiovascular: S1 S2 clear,  RRR. No pedal edema b/l  Respiratory: CTAB, no wheezing, rales or rhonchi  Gastrointestinal: Soft, G-tube+  Ext: no pedal edema bilaterally  Neuro: no new deficits  Musculoskeletal: No cyanosis, clubbing  Skin: No rashes  Psych: Normal affect and demeanor, alert and oriented x3     Data Reviewed:  I have personally reviewed following labs and imaging studies  Micro Results Recent Results (from the past 240 hour(s))  Novel Coronavirus, NAA (hospital order; send-out to ref lab)     Status: None   Collection Time: 10/28/18 12:53 PM   Specimen: Nasopharyngeal Swab; Respiratory  Result Value Ref Range Status   SARS-CoV-2, NAA NOT DETECTED NOT DETECTED Final    Comment: (NOTE) This test was developed and its performance characteristics determined by [employer]. This test has not been  FDA cleared or approved. This test has been authorized by FDA under an Emergency Use Authorization (EUA). This test is only authorized  for the duration of time the declaration that circumstances exist justifying the authorization of the emergency use of in vitro diagnostic tests for detection of SARS-CoV-2 virus and/or diagnosis of COVID-19 infection under section 564(b)(1) of the Act, 21 U.S.C. 161WRU-0(A)(5), unless the authorization is terminated or revoked sooner. When diagnostic testing is negative, the possibility of a false negative result should be considered in the context of a patient's recent exposures and the presence of clinical signs and symptoms consistent with COVID-19. An individual without symptoms of COVID-19 and who is not shedding SARS-CoV-2 virus would expect to have a negative (not detected) result in this assay. Performed  At: [facility] [address] [place], [place] [id number] Jolene Schimke MD NW:[id number]    Coronavirus Source NASOPHARYNGEAL  Final    Comment: Performed at [facility], [address], [place], [place] [address]    Radiology Reports Dg Orthopantogram  Result Date: 10/08/2018 CLINICAL DATA:  Poor dentition. Preoperative for laryngeal carcinoma. EXAM: ORTHOPANTOGRAM/PANORAMIC COMPARISON:  None. FINDINGS: Missing LEFT maxillary teeth from a first bicuspid through distal molar. Missing RIGHT maxillary teeth from second bicuspid through distal molar. Missing RIGHT mandibular teeth from first through distal molar. Missing LEFT mandibular first molar. No periapical lucency to suggest abscess. No definite caries. IMPRESSION: 1. Numerous missing mandibular and maxillary teeth, as detailed above. 2. No evidence of periapical dental abscess. Electronically Signed   By: Bary Richard M.D.   On: 10/08/2018 13:27   Dg Chest 1 View  Result Date: 10/19/2018 CLINICAL DATA:  S/P right thoracentesis. EXAM: CHEST  1 VIEW  COMPARISON:  10/18/2018 FINDINGS: Tracheostomy tube is unchanged. The heart size is normal. Small RIGHT pleural effusion, decreased since prior study. No pneumothorax. Minimal bibasilar atelectasis. IMPRESSION: Decreased RIGHT pleural effusion. No pneumothorax. Electronically Signed   By: Norva Pavlov M.D.   On: 10/19/2018 15:43   Ir Removal [employer] W/ [place] W/o Fl Mod Sed  Result Date: 10/14/2018 CLINICAL DATA:  History of head and neck squamous cell carcinoma. Status post right upper chest subcutaneous Port-A-Cath placement on 10/04/2018 with catheter access via the right internal jugular vein. Development Pseudomonas bacteremia now requiring Port-A-Cath removal due to suspected catheter seeding/source for infection. EXAM: REMOVAL OF IMPLANTED TUNNELED PORT-A-CATH MEDICATIONS: None PROCEDURE: The right chest Port-A-Cath site was prepped with chlorhexidine. A sterile gown and gloves were worn during the procedure. Local anesthesia was provided with 1% lidocaine. An incision was made overlying the Port-A-Cath with a #15 scalpel. Utilizing sharp and blunt dissection, the Port-A-Cath was removed. The pocked was debrided with gauze soaked in sterile saline. Iodoform gauze was then packed into the wound. A dressing was then applied over the iodoform gauze. FINDINGS: After making an incision, the port pocket was inspected and demonstrates bloody fluid which is mildly turbulent and felt to be likely infected. Tissue was also indurated and therefore the pocket was left open to heal secondarily. After debridement, iodoform gauze was packed into the port wound. The entire Port-A-Cath and attached catheter were successfully removed. IMPRESSION: Removal of implanted Port-A-Cath utilizing sharp and blunt dissection. The port pocket demonstrates what appears to be likely infected hematoma. After debridement of bloody fluid and clot, the wound was packed with iodoform gauze. Electronically Signed   By: Irish Lack  M.D.   On: 10/14/2018 14:34   Ir Replace G-tube Simple Wo Fluoro  Result Date: 11/05/2018 INDICATION: 73 year old male with a history of displaced gastrostomy EXAM: IR TUBE PLACEMENT/REPLACEMENT/CHECK MEDICATIONS: None ANESTHESIA/SEDATION: None CONTRAST:  None FLUOROSCOPY TIME:  None COMPLICATIONS: None PROCEDURE: Informed written consent was obtained from the patient after a thorough discussion of the procedural risks, benefits and alternatives. We confirmed the proper patient in the patient's room. Bedside exchange of the indwelling Foley catheter was performed for a new 14 French balloon retention into it gastrostomy. 7 cc of saline was inflated for retention. Patient tolerated the procedure well and remained hemodynamically stable throughout. No complications were encountered and no significant blood loss encountered. IMPRESSION: Status post bedside exchange of gastrostomy tube, with 14 French balloon retention gastrostomy placed Signed, Yvone Neu. Loreta Ave, DO [facility] Electronically Signed   By: Gilmer Mor D.O.   On: 11/05/2018 08:00   Ir Cm Inj Any Colonic Tube W/fluoro  Result Date: 10/30/2018 INDICATION: Laryngeal carcinoma, with surgical gastrostomy tube placement 10/07/2018. The tube became inadvertently dislodged and a Foley catheter was placed in the tract by the ED. EXAM: GI TUBE INJECTION MEDICATIONS: None indicated ANESTHESIA/SEDATION: None required CONTRAST:  7 mL Omnipaque 300 - administered into the gastric lumen. FLUOROSCOPY TIME:  24 seconds; 2 mGy COMPLICATIONS: None immediate. PROCEDURE: Informed written consent was obtained from the patient after a thorough discussion of the procedural risks, benefits and alternatives. All questions were addressed. Maximal Sterile Barrier Technique was utilized including caps, mask, sterile gowns, sterile gloves, sterile drape, hand hygiene and skin antiseptic. A timeout was performed prior  to the initiation of the procedure. Fluoroscopic inspection demonstrated T tacks, normal bowel gas pattern. Injection demonstrates tip of the gastrostomy catheter in the lumen of the stomach. The balloon was not inflated. The balloon was then inflated with 5 mL sterile saline in confirmed the within the gastric lumen. No evidence of extravasation. IMPRESSION: 1. The replacement balloon retention catheter is appropriately positioned within the gastric lumen, okay for routine use. Electronically Signed   By: Corlis Leak M.D.   On: 10/30/2018 16:41   Dg Chest Port 1 View  Result Date: 10/18/2018 CLINICAL DATA:  Shortness of breath. EXAM: PORTABLE CHEST 1 VIEW COMPARISON:  10/11/2018. FINDINGS: Interval removal of PowerPort catheter. Tracheostomy tube noted in stable position. Heart size normal. Right base infiltrate with right-sided pleural effusion. No pneumothorax. IMPRESSION: 1. Interval removal of PowerPort catheter. Tracheostomy tube noted with tip in stable position. 2.  Right base infiltrate with right-sided pleural effusion. Electronically Signed   By: Maisie Fus  Register   On: 10/18/2018 13:35   Dg Chest Port 1 View  Result Date: 10/11/2018 CLINICAL DATA:  Nausea and vomiting yesterday.  New tracheostomy. EXAM: PORTABLE CHEST 1 VIEW COMPARISON:  Chest CT 09/29/2018 FINDINGS: Tracheostomy tube appears in adequate position. Right IJ Port-A-Cath has tip over the SVC. Lungs are adequately inflated without focal airspace consolidation or effusion. No pneumothorax. Cardiomediastinal silhouette is within normal. Dense radiopaque material/calcifications over the posteromedial right lower lobe as seen on CT scan. Remainder of the exam is unchanged. IMPRESSION: No acute cardiopulmonary disease. Tracheostomy tube and right IJ Port-A-Cath in adequate position. Electronically Signed   By: Elberta Fortis M.D.   On: 10/11/2018 15:40   Dg Abd Portable 1v  Result Date: 10/11/2018 CLINICAL DATA:  New tracheostomy tube.  Nausea and vomiting yesterday. EXAM: PORTABLE ABDOMEN - 1 VIEW COMPARISON:  10/05/2018 FINDINGS: Percutaneous gastrostomy tube projects over the stomach in the left upper quadrant. Bowel gas pattern is nonobstructive. No free peritoneal air. Remainder of the exam is unchanged. IMPRESSION: Nonobstructive bowel gas pattern. Gastrostomy tube projects over the stomach in the left  upper quadrant. Electronically Signed   By: Elberta Fortis M.D.   On: 10/11/2018 15:41   Dg Swallowing Func-speech Pathology  Result Date: 10/29/2018 Objective Swallowing Evaluation: Type of Study: MBS-Modified Barium Swallow Study  Patient Details Name: HASTEN SILVERSTONE MRN: [id number] Date of Birth: Aug 08, 1945 Today's Date: 10/29/2018 Time: SLP Start Time (ACUTE ONLY): 6213 -SLP Stop Time (ACUTE ONLY): 0901 SLP Time Calculation (min) (ACUTE ONLY): 26 min Past Medical History: Past Medical History: Diagnosis Date  Diverticulitis   False positive serological test for hepatitis C 12/13/2016  GERD (gastroesophageal reflux disease)   Hepatitis C   HTN (hypertension)   Hyperlipidemia  Past Surgical History: Past Surgical History: Procedure Laterality Date  BIOPSY  05/04/2015  Procedure: BIOPSY;  Surgeon: West Bali, MD;  [facility]: [facility];  Service: Endoscopy;;  bx's of ileocecal valve   COLONOSCOPY WITH PROPOFOL N/A 05/04/2015  Dr. Darrick Penna: normal appearing ileum with prominent IC valve with tubular adenomas, moderate diverticulosis in sigmoid colon, ascending colon, and retum. Moderate sized internal hemorrhoids. Surveillance in 5 years  ESOPHAGOGASTRODUODENOSCOPY (EGD) WITH PROPOFOL N/A 12/19/2016  Procedure: ESOPHAGOGASTRODUODENOSCOPY (EGD) WITH PROPOFOL;  Surgeon: West Bali, MD;  [facility]: [facility];  Service: Endoscopy;  Laterality: N/A;  11:30am  FLEXIBLE SIGMOIDOSCOPY N/A 12/10/2015  hemorrhoid banding X 3   HEMORRHOID BANDING N/A 12/10/2015  Procedure: HEMORRHOID BANDING;  Surgeon: West Bali, MD;  Location: [facility];  Service: Endoscopy;  Laterality: N/A;  1:30 PM  IR GASTROSTOMY TUBE MOD SED  10/04/2018  IR IMAGING GUIDED PORT INSERTION  10/04/2018  IR REMOVAL TUN ACCESS W/ PORT W/O FL MOD SED  10/14/2018  LAPAROSCOPIC INSERTION GASTROSTOMY TUBE Left 10/07/2018  Procedure: LAPAROSCOPIC  GASTROSTOMY TUBE;  Surgeon: Rodman Pickle, MD;  Location: [facility] OR;  Service: General;  Laterality: Left;  MICROLARYNGOSCOPY N/A 09/27/2018  Procedure: MICRO DIRECT LARYNGOSCOPY WITH BIOPSY;  Surgeon: Newman Pies, MD;  [facility];  Service: ENT;  Laterality: N/A;  None to date    As of 04/14/15  POLYPECTOMY  05/04/2015  Procedure: POLYPECTOMY;  Surgeon: West Bali, MD;  Location: [facility];  Service: Endoscopy;;  descending colon polyp, ascending colon polyp  SAVORY DILATION N/A 12/19/2016  Procedure: SAVORY DILATION;  Surgeon: West Bali, MD;  Location: [facility];  Service: Endoscopy;  Laterality: N/A;  TRACHEOSTOMY TUBE PLACEMENT N/A 09/27/2018  Procedure: AWAKE TRACHEOSTOMY;  Surgeon: Newman Pies, MD;  Location: [facility] OR;  Service: ENT;  Laterality: N/A; HPI: Pt is a 73 yo male admitted for planned trach and laryngeal biopsy 7/2. Biopsy results pending but Neck CT shows extensive laryngeal, pharyngeal, esophageal mass- diagnosed as cancer.  Pt is s/p trach and PEG tube.  He is undergoing radiation tx started 10/22/2018.   Esophagram 6/26 showed silent gross aspiration of barium. PMH includes: HLD, HTN, hepatitis C, GERD, diverticulitis.  Pt has been followed by SLP for dysphagia and pmsv.  He has previously declined to consume po intake but has been using his pmsv and conducting two swallowing exercises (lingual press and effortful swallow).  Subjective: pt awake in chair Assessment / Plan / Recommendation [facility] IP CLINICAL IMPRESSIONS 10/29/2018 Clinical Impression Patient presents with severe pharyngeal dysphagia characterized by decreased tongue base retraction, laryngeal elevation/closure resulting in laryngeal  penetration and trace aspiration (silent). Pt  conducts multiple swallows - piecemealing with each bolus; self-created compensation strategy likely.  Trace aspiration cleared with cued cough/expectoration.  Pt was tested with PMSV on only.  SLP  did not tax pt - only had him consume very small boluses to keep him from aspirating.  Recommend pt have ice chips and water independently; trials of other po (thin, nectar, puree) with SLP.  Using live monitor, pt educated to findings/compensation strategies with teach back.  He will need SLP at next venue of care to continue swallowing management.  Due to sensori-motor deficit, he will need repeat MBS prior to transitioning into po. SLP Visit Diagnosis Aphonia (R49.1);Dysphagia, unspecified (R13.10) Attention and concentration deficit following -- Frontal lobe and executive function deficit following -- Impact on safety and function Risk for inadequate nutrition/hydration;Moderate aspiration risk   [facility] IP TREATMENT RECOMMENDATION 10/29/2018 Treatment Recommendations Therapy as outlined in treatment plan below   Prognosis 10/29/2018 Prognosis for Safe Diet Advancement Good Barriers to Reach Goals Other (Comment) Barriers/Prognosis Comment -- [facility] IP DIET RECOMMENDATION 10/29/2018 SLP Diet Recommendations NPO;Ice chips PRN after oral care;Free water protocol after oral care Liquid Administration via Cup;Spoon Medication Administration Via alternative means Compensations -- Postural Changes --   [facility] IP OTHER RECOMMENDATIONS 10/29/2018 Recommended Consults -- Oral Care Recommendations Oral care QID Other Recommendations Have oral suction available   [facility] IP FOLLOW UP RECOMMENDATIONS 10/29/2018 Follow up Recommendations Skilled Nursing facility   [facility] IP FREQUENCY AND DURATION 10/29/2018 Speech Therapy Frequency (ACUTE ONLY) min 2x/week Treatment Duration 2 weeks      [facility] IP ORAL PHASE 10/29/2018 Oral Phase WFL Oral - Pudding Teaspoon -- Oral - Pudding Cup -- Oral - Honey Teaspoon Piecemeal  swallowing Oral - Honey Cup -- Oral - Nectar Teaspoon Piecemeal swallowing Oral - Nectar Cup Piecemeal swallowing Oral - Nectar Straw -- Oral - Thin Teaspoon Piecemeal swallowing Oral - Thin Cup Piecemeal swallowing Oral - Thin Straw -- Oral - Puree Piecemeal swallowing Oral - Mech Soft -- Oral - Regular -- Oral - Multi-Consistency -- Oral - Pill -- Oral Phase - Comment --  [facility] IP PHARYNGEAL PHASE 10/29/2018 Pharyngeal Phase Impaired Pharyngeal- Pudding Teaspoon -- Pharyngeal -- Pharyngeal- Pudding Cup -- Pharyngeal -- Pharyngeal- Honey Teaspoon Pharyngeal residue - pyriform;Reduced laryngeal elevation;Reduced airway/laryngeal closure;Reduced epiglottic inversion;Reduced anterior laryngeal mobility;Reduced tongue base retraction Pharyngeal Material enters airway, remains ABOVE vocal cords and not ejected out Pharyngeal- Honey Cup -- Pharyngeal -- Pharyngeal- Nectar Teaspoon Reduced tongue base retraction;Pharyngeal residue - pyriform;Reduced epiglottic inversion;Reduced anterior laryngeal mobility;Reduced laryngeal elevation;Reduced airway/laryngeal closure Pharyngeal Material enters airway, remains ABOVE vocal cords and not ejected out Pharyngeal- Nectar Cup Reduced epiglottic inversion;Reduced anterior laryngeal mobility;Reduced laryngeal elevation;Reduced airway/laryngeal closure;Reduced tongue base retraction;Trace aspiration;Penetration/Aspiration during swallow;Penetration/Apiration after swallow Pharyngeal Material enters airway, remains ABOVE vocal cords and not ejected out;Material enters airway, CONTACTS cords and not ejected out;Material enters airway, passes BELOW cords without attempt by patient to eject out (silent aspiration) Pharyngeal- Nectar Straw -- Pharyngeal -- Pharyngeal- Thin Teaspoon Reduced epiglottic inversion;Reduced anterior laryngeal mobility;Reduced laryngeal elevation;Reduced airway/laryngeal closure;Reduced tongue base retraction;Penetration/Aspiration during  swallow;Penetration/Apiration after swallow Pharyngeal Material enters airway, remains ABOVE vocal cords and not ejected out;Material enters airway, CONTACTS cords and then ejected out Pharyngeal- Thin Cup Reduced epiglottic inversion;Reduced anterior laryngeal mobility;Reduced laryngeal elevation;Reduced airway/laryngeal closure;Reduced tongue base retraction;Penetration/Aspiration during swallow;Penetration/Apiration after swallow Pharyngeal Material enters airway, remains ABOVE vocal cords and not ejected out;Material enters airway, passes BELOW cords without attempt by patient to eject out (silent aspiration);Material enters airway, CONTACTS cords and then ejected out Pharyngeal- Thin Straw -- Pharyngeal -- Pharyngeal- Puree Reduced epiglottic inversion;Reduced anterior laryngeal mobility;Reduced laryngeal elevation;Reduced airway/laryngeal closure;Reduced tongue base retraction;Pharyngeal residue - valleculae;Pharyngeal residue - pyriform Pharyngeal  Material does not enter airway Pharyngeal- Mechanical Soft -- Pharyngeal -- Pharyngeal- Regular -- Pharyngeal -- Pharyngeal- Multi-consistency -- Pharyngeal -- Pharyngeal- Pill -- Pharyngeal -- Pharyngeal Comment pt conducts multiple swallows per bolus; piecemeals - which is likely self created compensatory strategy as he denies sensation of residuals; cued cough/"hock" effective to remove penetrates and trace aspirates  CHL IP CERVICAL ESOPHAGEAL PHASE 10/29/2018 Cervical Esophageal Phase Impaired Pudding Teaspoon -- Pudding Cup -- Honey Teaspoon -- Honey Cup -- Nectar Teaspoon -- Nectar Cup -- Nectar Straw -- Thin Teaspoon -- Thin Cup -- Thin Straw -- Puree -- Mechanical Soft -- Regular -- Multi-consistency -- Pill -- Cervical Esophageal Comment decreased UES opening Chales Abrahams 10/29/2018, 9:47 AM Donavan Burnet, MS [facility] SLP Acute Rehab Services Pager [phone] Office [phone]              US Thoracentesis Asp Pleural Space W/img Guide  Result Date:  10/19/2018 INDICATION: Shortness of breath. Right-sided pleural effusion. Request for diagnostic and therapeutic thoracentesis. EXAM: ULTRASOUND GUIDED RIGHT THORACENTESIS MEDICATIONS: None. COMPLICATIONS: None immediate. PROCEDURE: An ultrasound guided thoracentesis was thoroughly discussed with the patient and questions answered. The benefits, risks, alternatives and complications were also discussed. The patient understands and wishes to proceed with the procedure. Written consent was obtained. Ultrasound was performed to localize and mark an adequate pocket of fluid in the right chest. The area was then prepped and draped in the normal sterile fashion. 1% Lidocaine was used for local anesthesia. Under ultrasound guidance a 6 Fr Safe-T-Centesis catheter was introduced. Thoracentesis was performed. The catheter was removed and a dressing applied. FINDINGS: A total of approximately 450 mL of clear, pale yellow fluid was removed. Samples were sent to the laboratory as requested by the clinical team. IMPRESSION: Successful ultrasound guided right thoracentesis yielding 450 mL of pleural fluid. Read by: Brayton El PA-C Electronically Signed   By: Simonne Come M.D.   On: 10/19/2018 15:29    Lab Data:  CBC: No results for input(s): WBC, NEUTROABS, HGB, HCT, MCV, PLT in the last 168 hours. Basic Metabolic Panel: Recent Labs  Lab 11/01/18 0533  NA 135  K 4.2  CL 96*  CO2 29  GLUCOSE 119*  BUN 19  CREATININE 0.74  CALCIUM 9.0  MG 2.1   GFR: Estimated Creatinine Clearance: 64 mL/min (by C-G formula based on SCr of 0.74 mg/dL). Liver Function Tests: No results for input(s): AST, ALT, ALKPHOS, BILITOT, PROT, ALBUMIN in the last 168 hours. No results for input(s): LIPASE, AMYLASE in the last 168 hours. No results for input(s): AMMONIA in the last 168 hours. Coagulation Profile: No results for input(s): INR, PROTIME in the last 168 hours. Cardiac Enzymes: No results for input(s): CKTOTAL, CKMB,  CKMBINDEX, TROPONINI in the last 168 hours. BNP (last 3 results) No results for input(s): PROBNP in the last 8760 hours. HbA1C: No results for input(s): HGBA1C in the last 72 hours. CBG: Recent Labs  Lab 11/05/18 2046 11/06/18 0005 11/06/18 0504 11/06/18 0746 11/06/18 1219  GLUCAP 117* 98 134* 120* 121*   Lipid Profile: No results for input(s): CHOL, HDL, LDLCALC, TRIG, CHOLHDL, LDLDIRECT in the last 72 hours. Thyroid Function Tests: No results for input(s): TSH, T4TOTAL, FREET4, T3FREE, THYROIDAB in the last 72 hours. Anemia Panel: No results for input(s): VITAMINB12, FOLATE, FERRITIN, TIBC, IRON, RETICCTPCT in the last 72 hours. Urine analysis:    Component Value Date/Time   COLORURINE YELLOW 03/30/2015 1330   APPEARANCEUR CLEAR 03/30/2015 1330   LABSPEC >1.030 (H)  03/30/2015 1330   PHURINE 5.5 03/30/2015 1330   GLUCOSEU NEGATIVE 03/30/2015 1330   HGBUR TRACE (A) 03/30/2015 1330   BILIRUBINUR NEGATIVE 03/30/2015 1330   KETONESUR NEGATIVE 03/30/2015 1330   PROTEINUR NEGATIVE 03/30/2015 1330   NITRITE NEGATIVE 03/30/2015 1330   LEUKOCYTESUR NEGATIVE 03/30/2015 1330     Samanta Gal M.D. [facility] 11/06/2018, 12:30 PM  Pager: [phone] Between 7am to 7pm - call Pager - [phone]  After 7pm go to [email or web address] - password [email or web address]  Call night coverage person covering after 7pm

## 2018-11-06 NOTE — Progress Notes (Signed)
SLP Cancellation Note  Patient Details Name: Logan SCHOLZ MRN: [id number] DOB: Apr 16, 1945   Cancelled treatment:       Reason Eval/Treat Not Completed: Fatigue/lethargy limiting ability to participate(2nd attempt to see pt today, earlier was going to radiation and now fully asleep, please order SLP for dysphagia treatment after discharge)   Macario Golds 11/06/2018, 3:02 PM Luanna Salk, [facility] SLP [place] Pager [phone] Office [phone]

## 2018-11-07 ENCOUNTER — Ambulatory Visit
Admit: 2018-11-07 | Discharge: 2018-11-07 | Disposition: A | Discharge status: Home / Self Care | Payer: Medicare Other | Attending: Radiation Oncology | Admitting: Radiation Oncology

## 2018-11-07 ENCOUNTER — Encounter: Payer: Self-pay | Admitting: *Deleted

## 2018-11-07 LAB — GLUCOSE, CAPILLARY
Glucose-Capillary: 119 mg/dL — ABNORMAL HIGH (ref 70–99)
Glucose-Capillary: 106 mg/dL — ABNORMAL HIGH (ref 70–99)
Glucose-Capillary: 108 mg/dL — ABNORMAL HIGH (ref 70–99)
Glucose-Capillary: 136 mg/dL — ABNORMAL HIGH (ref 70–99)
Glucose-Capillary: 112 mg/dL — ABNORMAL HIGH (ref 70–99)

## 2018-11-07 MED ORDER — METOPROLOL TARTRATE 25 MG/10 ML ORAL SUSPENSION: 50.0000 mg | Freq: Three times a day (TID) | ORAL | 4 refills | Status: DC

## 2018-11-07 MED ORDER — LOPERAMIDE HCL 1 MG/7.5ML PO SUSP: 2.0000 mg | ORAL | 1 refills | Status: DC | PRN

## 2018-11-07 MED ORDER — SCOPOLAMINE 1 MG/3DAYS TD PT72: 1.0000 | MEDICATED_PATCH | TRANSDERMAL | 12 refills | Status: DC

## 2018-11-07 MED ORDER — FREE WATER: 200.0000 mL | Status: DC

## 2018-11-07 MED ORDER — HYDROCORTISONE (PERIANAL) 2.5 % EX CREA: 1.0000 "application " | TOPICAL_CREAM | Freq: Four times a day (QID) | CUTANEOUS | 3 refills | Status: DC | PRN

## 2018-11-07 MED ORDER — KATE FARMS PEPTIDE 1.5 PO LIQD: 325.0000 mL | ORAL | 12 refills | Status: DC

## 2018-11-07 MED ORDER — IPRATROPIUM-ALBUTEROL 0.5-2.5 (3) MG/3ML IN SOLN: 3.0000 mL | RESPIRATORY_TRACT | 4 refills | Status: DC | PRN

## 2018-11-07 NOTE — TOC Progression Note (Signed)
Transition of Care [facility]) - Progression Note    Patient Details  Name: Logan Price MRN: [id number] Date of Birth: Dec 07, 1945  Transition of Care [facility]) CM/SW [place], [place] Phone Number: 11/07/2018, 2:40 PM  Clinical Narrative:    CSW confirm w/ APRIA Rep patient DME delivered Home O2 today, and 3 IN 1 [facility] to provide RN, Nurse aide, OT, PT,  Advance infusion- Rep. Ferdinand Cava inform supplies to feeding pump and tube feeds- will be delivered today.   RN unable to meet patient and family today, can meet tomorrow. Patient will discharge tomorrow 8/14   Expected Discharge Plan: [place] Barriers to Discharge: Continued Medical Work up  Expected Discharge Plan and Services Expected Discharge Plan: [place]   Discharge Planning Services: CM Consult Post Acute Care Choice: Home Health   Expected Discharge Date: 11/07/18               DME Arranged: Tube feeding, Tube feeding pump, Trach supplies DME Agency: [place] Date DME Agency Contacted: 11/07/18 Time DME Agency Contacted: [id number] Representative spoke with at DME Agency: Jeneen Rinks HH Arranged: PT, RN, OT, Nurse's Aide [place] Agency: [facility] [place] Date [place]: 11/07/18 Time [place]: [place] Representative spoke with at [place] ([place]) Interventions    Readmission Risk Interventions Readmission Risk Prevention Plan 10/28/2018  Transportation Screening Complete  PCP or Specialist Appt within 3-5 Days Complete  HRI or [place] Not Complete  HRI or Home Care Consult comments dc to [facility]  Social Work Consult for [place] Planning/Counseling Complete  Palliative Care Screening Not Applicable  Medication Review Press photographer) Complete  Some recent data might be hidden

## 2018-11-07 NOTE — Discharge Summary (Signed)
Physician Discharge Summary   Patient ID: Logan Price MRN: [id number] DOB/AGE: 1945-09-02 73 y.o.  Admit date: 09/27/2018 Discharge date: 11/07/2018  Primary Care Physician:  Sharion Balloon, FNP   Recommendations for Outpatient Follow-up:  1. Follow up with PCP in 1-2 weeks 2. Patient completed #13 out of planned course of 35 XRT treatments today 3. Outpatient follow-up appointments as noted below 4. Follow-up chest CT in 12 months to demonstrate continued stability  Home Health: Home health PT OT, RN, home health aide, respiratory care Equipment/Devices: DME 3 n1, DME home O2, DME trach supplies, to feeding pump and tube feeds   Discharge Condition: stable  CODE STATUS: FULL  Diet recommendation:  Tube feeds Dillard Essex peptide 1.5 at 60 cc an hour (4.5 cans/day via pump), 30 mL pro-stat twice daily and 200 mL free water every 4 hours     Discharge Diagnoses:    . 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 PSVT (paroxysmal supraventricular tachycardia) (HCC) Moderate to severe protein calorie malnutrition Pseudomonas bacteremia Acute kidney injury secondary to urinary retention Transudative pleural effusion status post thoracentesis on 7/25 Essential hypertension Bilateral upper lobe solid pulmonary nodule Aortic atherosclerosis Emphysema by chest CT  Consults:      Admitted by ENT 7/3, Dr Benjamine Mola  Oncology: Dr. Maylon Peppers  Interventional radiology: Dr. Jacqualyn Posey  General surgery: Dr. Brantley Stage  Dental surgery: Dr. Dorothyann Gibbs  Cardiology: Dr. Harl Bowie   Infectious disease: Dr. Tommy Medal   Radiation oncology   Allergies:  No Known Allergies   DISCHARGE MEDICATIONS: Allergies as of 11/07/2018   No Known Allergies     Medication List    STOP taking these medications   amLODipine 10 MG tablet Commonly known as: NORVASC   aspirin EC 81 MG tablet   multivitamin with  minerals Tabs tablet     TAKE these medications   doxazosin 1 MG tablet Commonly known as: CARDURA Place 1 tablet (1 mg total) into feeding tube daily.   feeding supplement (PRO-STAT SUGAR FREE 64) Liqd Place 30 mLs into feeding tube 2 (two) times daily.   fluticasone 50 MCG/ACT nasal spray Commonly known as: FLONASE SPRAY 2 SPRAYS INTO EACH NOSTRIL EVERY DAY What changed: See the new instructions.   free water Soln Place 200 mLs into feeding tube every 4 (four) hours.   hydrocortisone 2.5 % rectal cream Commonly known as: ANUSOL-HC Apply 1 application topically 4 (four) times daily as needed for hemorrhoids or anal itching.   ipratropium-albuterol 0.5-2.5 (3) MG/3ML Soln Commonly known as: DUONEB Take 3 mLs by nebulization every 4 (four) hours as needed.   Dillard [place] Peptide 1.5 Liqd 325 mLs by PEG Tube route continuous.   loperamide HCl 1 MG/7.5ML suspension Commonly known as: IMODIUM Place 15 mLs (2 mg total) into feeding tube as needed for diarrhea or loose stools.   metoprolol tartrate 25 mg/10 mL Susp Commonly known as: LOPRESSOR Place 20 mLs (50 mg total) into feeding tube 3 (three) times daily.   pantoprazole 40 MG tablet Commonly known as: Protonix 1 po 30 mins prior to first meal What changed:   how much to take  how to take this  when to take this   scopolamine 1 MG/3DAYS Commonly known as: TRANSDERM-SCOP Place 1 patch (1.5 mg total) onto the skin every 3 (three) days. Start taking on: November 09, 2018   simvastatin 20 MG tablet Commonly known as: Emergency planning/management officer  Physician Discharge Summary   Patient ID: Logan Price MRN: [id number] DOB/AGE: 1945-09-02 73 y.o.  Admit date: 09/27/2018 Discharge date: 11/07/2018  Primary Care Physician:  Sharion Balloon, FNP   Recommendations for Outpatient Follow-up:  1. Follow up with PCP in 1-2 weeks 2. Patient completed #13 out of planned course of 35 XRT treatments today 3. Outpatient follow-up appointments as noted below 4. Follow-up chest CT in 12 months to demonstrate continued stability  Home Health: Home health PT OT, RN, home health aide, respiratory care Equipment/Devices: DME 3 n1, DME home O2, DME trach supplies, to feeding pump and tube feeds   Discharge Condition: stable  CODE STATUS: FULL  Diet recommendation:  Tube feeds Dillard [place] peptide 1.5 at 60 cc an hour (4.5 cans/day via pump), 30 mL pro-stat twice daily and 200 mL free water every 4 hours     Discharge Diagnoses:    . 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 PSVT (paroxysmal supraventricular tachycardia) (HCC) Moderate to severe protein calorie malnutrition Pseudomonas bacteremia Acute kidney injury secondary to urinary retention Transudative pleural effusion status post thoracentesis on 7/25 Essential hypertension Bilateral upper lobe solid pulmonary nodule Aortic atherosclerosis Emphysema by chest CT  Consults:      Admitted by ENT 7/3, Dr Benjamine Mola  Oncology: Dr. Maylon Peppers  Interventional radiology: Dr. Jacqualyn Posey  General surgery: Dr. Brantley Stage  Dental surgery: Dr. Dorothyann Gibbs  Cardiology: Dr. Harl Bowie   Infectious disease: Dr. Tommy Medal   Radiation oncology   Allergies:  No Known Allergies   DISCHARGE MEDICATIONS: Allergies as of 11/07/2018   No Known Allergies     Medication List    STOP taking these medications   amLODipine 10 MG tablet Commonly known as: NORVASC   aspirin EC 81 MG tablet   multivitamin with  minerals Tabs tablet     TAKE these medications   doxazosin 1 MG tablet Commonly known as: CARDURA Place 1 tablet (1 mg total) into feeding tube daily.   feeding supplement (PRO-STAT SUGAR FREE 64) Liqd Place 30 mLs into feeding tube 2 (two) times daily.   fluticasone 50 MCG/ACT nasal spray Commonly known as: FLONASE SPRAY 2 SPRAYS INTO EACH NOSTRIL EVERY DAY What changed: See the new instructions.   free water Soln Place 200 mLs into feeding tube every 4 (four) hours.   hydrocortisone 2.5 % rectal cream Commonly known as: ANUSOL-HC Apply 1 application topically 4 (four) times daily as needed for hemorrhoids or anal itching.   ipratropium-albuterol 0.5-2.5 (3) MG/3ML Soln Commonly known as: DUONEB Take 3 mLs by nebulization every 4 (four) hours as needed.   Dillard [place] Peptide 1.5 Liqd 325 mLs by PEG Tube route continuous.   loperamide HCl 1 MG/7.5ML suspension Commonly known as: IMODIUM Place 15 mLs (2 mg total) into feeding tube as needed for diarrhea or loose stools.   metoprolol tartrate 25 mg/10 mL Susp Commonly known as: LOPRESSOR Place 20 mLs (50 mg total) into feeding tube 3 (three) times daily.   pantoprazole 40 MG tablet Commonly known as: Protonix 1 po 30 mins prior to first meal What changed:   how much to take  how to take this  when to take this   scopolamine 1 MG/3DAYS Commonly known as: TRANSDERM-SCOP Place 1 patch (1.5 mg total) onto the skin every 3 (three) days. Start taking on: November 09, 2018   simvastatin 20 MG tablet Commonly known as: Emergency planning/management officer  Interval removal of PowerPort catheter. Tracheostomy tube noted with tip in stable position. 2.  Right base infiltrate with right-sided pleural effusion. Electronically Signed   By: Marcello Moores  Register   On: 10/18/2018 13:35   Dg Chest Port 1 View  Result Date: 10/11/2018 CLINICAL DATA:  Nausea and vomiting yesterday.  New tracheostomy. EXAM: PORTABLE CHEST 1 VIEW COMPARISON:  Chest CT 09/29/2018 FINDINGS: Tracheostomy tube appears in adequate position. Right IJ Port-A-Cath has tip over the SVC. Lungs are adequately inflated without focal airspace consolidation or effusion. No pneumothorax. Cardiomediastinal silhouette is within normal. Dense radiopaque material/calcifications over the posteromedial right lower lobe as seen on CT scan. Remainder of the exam is unchanged. IMPRESSION: No acute cardiopulmonary disease. Tracheostomy tube  and right IJ Port-A-Cath in adequate position. Electronically Signed   By: Marin Olp M.D.   On: 10/11/2018 15:40   Dg Abd Portable 1v  Result Date: 10/11/2018 CLINICAL DATA:  New tracheostomy tube. Nausea and vomiting yesterday. EXAM: PORTABLE ABDOMEN - 1 VIEW COMPARISON:  10/05/2018 FINDINGS: Percutaneous gastrostomy tube projects over the stomach in the left upper quadrant. Bowel gas pattern is nonobstructive. No free peritoneal air. Remainder of the exam is unchanged. IMPRESSION: Nonobstructive bowel gas pattern. Gastrostomy tube projects over the stomach in the left upper quadrant. Electronically Signed   By: Marin Olp M.D.   On: 10/11/2018 15:41   Dg Swallowing Func-speech Pathology  Result Date: 10/29/2018 Objective Swallowing Evaluation: Type of Study: MBS-Modified Barium Swallow Study  Patient Details Name: Logan Price MRN: [id number] Date of Birth: [birth year] Today's Date: 10/29/2018 Time: SLP Start Time (ACUTE ONLY): 2951 -SLP Stop Time (ACUTE ONLY): 0901 SLP Time Calculation (min) (ACUTE ONLY): 26 min Past Medical History: Past Medical History: Diagnosis Date . Diverticulitis  . 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 GERD (gastroesophageal reflux disease)  . Hepatitis C  . HTN (hypertension)  . Hyperlipidemia  Past Surgical History: Past Surgical History: Procedure Laterality Date . BIOPSY  05/04/2015  Procedure: BIOPSY;  Surgeon: Danie Binder, MD;  Location: [facility];  Service: Endoscopy;;  bx's of ileocecal valve  . COLONOSCOPY WITH PROPOFOL N/A 05/04/2015  Dr. Oneida Alar: normal appearing ileum with prominent IC valve with tubular adenomas, moderate diverticulosis in sigmoid colon, ascending colon, and retum. Moderate sized internal hemorrhoids. Surveillance in 5 years . ESOPHAGOGASTRODUODENOSCOPY (EGD) WITH PROPOFOL N/A 12/19/2016  Procedure: ESOPHAGOGASTRODUODENOSCOPY (EGD) WITH PROPOFOL;  Surgeon: Danie Binder, MD;  Location: [facility];  Service: Endoscopy;   Laterality: N/A;  11:30am . FLEXIBLE SIGMOIDOSCOPY N/A 12/10/2015  hemorrhoid banding X 3  . HEMORRHOID BANDING N/A 12/10/2015  Procedure: HEMORRHOID BANDING;  Surgeon: Danie Binder, MD;  Location: [facility];  Service: Endoscopy;  Laterality: N/A;  1:30 PM . IR GASTROSTOMY TUBE MOD SED  10/04/2018 . IR IMAGING GUIDED PORT INSERTION  10/04/2018 . IR REMOVAL TUN ACCESS W/ PORT W/O FL MOD SED  10/14/2018 . LAPAROSCOPIC INSERTION GASTROSTOMY TUBE Left 10/07/2018  Procedure: LAPAROSCOPIC  GASTROSTOMY TUBE;  Surgeon: Kinsinger, Arta Bruce, MD;  Location: [facility];  Service: General;  Laterality: Left; Marland Kitchen MICROLARYNGOSCOPY N/A 09/27/2018  Procedure: MICRO DIRECT LARYNGOSCOPY WITH BIOPSY;  Surgeon: Leta Baptist, MD;  Location[facility];  Service: ENT;  Laterality: N/A; . None to date    As of 04/14/15 . POLYPECTOMY  05/04/2015  Procedure: POLYPECTOMY;  Surgeon: Danie Binder, MD;  Location: [facility];  Service: Endoscopy;;  descending colon polyp, ascending colon polyp . SAVORY DILATION N/A 12/19/2016  Interval removal of PowerPort catheter. Tracheostomy tube noted with tip in stable position. 2.  Right base infiltrate with right-sided pleural effusion. Electronically Signed   By: Marcello Moores  Register   On: 10/18/2018 13:35   Dg Chest Port 1 View  Result Date: 10/11/2018 CLINICAL DATA:  Nausea and vomiting yesterday.  New tracheostomy. EXAM: PORTABLE CHEST 1 VIEW COMPARISON:  Chest CT 09/29/2018 FINDINGS: Tracheostomy tube appears in adequate position. Right IJ Port-A-Cath has tip over the SVC. Lungs are adequately inflated without focal airspace consolidation or effusion. No pneumothorax. Cardiomediastinal silhouette is within normal. Dense radiopaque material/calcifications over the posteromedial right lower lobe as seen on CT scan. Remainder of the exam is unchanged. IMPRESSION: No acute cardiopulmonary disease. Tracheostomy tube  and right IJ Port-A-Cath in adequate position. Electronically Signed   By: Marin Olp M.D.   On: 10/11/2018 15:40   Dg Abd Portable 1v  Result Date: 10/11/2018 CLINICAL DATA:  New tracheostomy tube. Nausea and vomiting yesterday. EXAM: PORTABLE ABDOMEN - 1 VIEW COMPARISON:  10/05/2018 FINDINGS: Percutaneous gastrostomy tube projects over the stomach in the left upper quadrant. Bowel gas pattern is nonobstructive. No free peritoneal air. Remainder of the exam is unchanged. IMPRESSION: Nonobstructive bowel gas pattern. Gastrostomy tube projects over the stomach in the left upper quadrant. Electronically Signed   By: Marin Olp M.D.   On: 10/11/2018 15:41   Dg Swallowing Func-speech Pathology  Result Date: 10/29/2018 Objective Swallowing Evaluation: Type of Study: MBS-Modified Barium Swallow Study  Patient Details Name: Logan Price MRN: [id number] Date of Birth: [birth year] Today's Date: 10/29/2018 Time: SLP Start Time (ACUTE ONLY): 2951 -SLP Stop Time (ACUTE ONLY): 0901 SLP Time Calculation (min) (ACUTE ONLY): 26 min Past Medical History: Past Medical History: Diagnosis Date . Diverticulitis  . 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 GERD (gastroesophageal reflux disease)  . Hepatitis C  . HTN (hypertension)  . Hyperlipidemia  Past Surgical History: Past Surgical History: Procedure Laterality Date . BIOPSY  05/04/2015  Procedure: BIOPSY;  Surgeon: Danie Binder, MD;  [facility]: [facility];  Service: Endoscopy;;  bx's of ileocecal valve  . COLONOSCOPY WITH PROPOFOL N/A 05/04/2015  Dr. Oneida Alar: normal appearing ileum with prominent IC valve with tubular adenomas, moderate diverticulosis in sigmoid colon, ascending colon, and retum. Moderate sized internal hemorrhoids. Surveillance in 5 years . ESOPHAGOGASTRODUODENOSCOPY (EGD) WITH PROPOFOL N/A 12/19/2016  Procedure: ESOPHAGOGASTRODUODENOSCOPY (EGD) WITH PROPOFOL;  Surgeon: Danie Binder, MD;  [facility]: [facility];  Service: Endoscopy;   Laterality: N/A;  11:30am . FLEXIBLE SIGMOIDOSCOPY N/A 12/10/2015  hemorrhoid banding X 3  . HEMORRHOID BANDING N/A 12/10/2015  Procedure: HEMORRHOID BANDING;  Surgeon: Danie Binder, MD;  [facility]: [facility];  Service: Endoscopy;  Laterality: N/A;  1:30 PM . IR GASTROSTOMY TUBE MOD SED  10/04/2018 . IR IMAGING GUIDED PORT INSERTION  10/04/2018 . IR REMOVAL TUN ACCESS W/ PORT W/O FL MOD SED  10/14/2018 . LAPAROSCOPIC INSERTION GASTROSTOMY TUBE Left 10/07/2018  Procedure: LAPAROSCOPIC  GASTROSTOMY TUBE;  Surgeon: Kinsinger, Arta Bruce, MD;  Location: [facility];  Service: General;  Laterality: Left; Marland Kitchen MICROLARYNGOSCOPY N/A 09/27/2018  Procedure: MICRO DIRECT LARYNGOSCOPY WITH BIOPSY;  Surgeon: Leta Baptist, MD;  Location: [facility] OR;  Service: ENT;  Laterality: N/A; . None to date    As of 04/14/15 . POLYPECTOMY  05/04/2015  Procedure: POLYPECTOMY;  Surgeon: Danie Binder, MD;  Location: [facility];  Service: Endoscopy;;  descending colon polyp, ascending colon polyp . SAVORY DILATION N/A 12/19/2016  Physician Discharge Summary   Patient ID: Logan Price MRN: [id number] DOB/AGE: 1945-09-02 73 y.o.  Admit date: 09/27/2018 Discharge date: 11/07/2018  Primary Care Physician:  Sharion Balloon, FNP   Recommendations for Outpatient Follow-up:  1. Follow up with PCP in 1-2 weeks 2. Patient completed #13 out of planned course of 35 XRT treatments today 3. Outpatient follow-up appointments as noted below 4. Follow-up chest CT in 12 months to demonstrate continued stability  Home Health: Home health PT OT, RN, home health aide, respiratory care Equipment/Devices: DME 3 n1, DME home O2, DME trach supplies, to feeding pump and tube feeds   Discharge Condition: stable  CODE STATUS: FULL  Diet recommendation:  Tube feeds Dillard [place] peptide 1.5 at 60 cc an hour (4.5 cans/day via pump), 30 mL pro-stat twice daily and 200 mL free water every 4 hours     Discharge Diagnoses:    . 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 PSVT (paroxysmal supraventricular tachycardia) (HCC) Moderate to severe protein calorie malnutrition Pseudomonas bacteremia Acute kidney injury secondary to urinary retention Transudative pleural effusion status post thoracentesis on 7/25 Essential hypertension Bilateral upper lobe solid pulmonary nodule Aortic atherosclerosis Emphysema by chest CT  Consults:      Admitted by ENT 7/3, Dr Benjamine Mola  Oncology: Dr. Maylon Peppers  Interventional radiology: Dr. Jacqualyn Posey  General surgery: Dr. Brantley Stage  Dental surgery: Dr. Dorothyann Gibbs  Cardiology: Dr. Harl Bowie   Infectious disease: Dr. Tommy Medal   Radiation oncology   Allergies:  No Known Allergies   DISCHARGE MEDICATIONS: Allergies as of 11/07/2018   No Known Allergies     Medication List    STOP taking these medications   amLODipine 10 MG tablet Commonly known as: NORVASC   aspirin EC 81 MG tablet   multivitamin with  minerals Tabs tablet     TAKE these medications   doxazosin 1 MG tablet Commonly known as: CARDURA Place 1 tablet (1 mg total) into feeding tube daily.   feeding supplement (PRO-STAT SUGAR FREE 64) Liqd Place 30 mLs into feeding tube 2 (two) times daily.   fluticasone 50 MCG/ACT nasal spray Commonly known as: FLONASE SPRAY 2 SPRAYS INTO EACH NOSTRIL EVERY DAY What changed: See the new instructions.   free water Soln Place 200 mLs into feeding tube every 4 (four) hours.   hydrocortisone 2.5 % rectal cream Commonly known as: ANUSOL-HC Apply 1 application topically 4 (four) times daily as needed for hemorrhoids or anal itching.   ipratropium-albuterol 0.5-2.5 (3) MG/3ML Soln Commonly known as: DUONEB Take 3 mLs by nebulization every 4 (four) hours as needed.   Dillard [place] Peptide 1.5 Liqd 325 mLs by PEG Tube route continuous.   loperamide HCl 1 MG/7.5ML suspension Commonly known as: IMODIUM Place 15 mLs (2 mg total) into feeding tube as needed for diarrhea or loose stools.   metoprolol tartrate 25 mg/10 mL Susp Commonly known as: LOPRESSOR Place 20 mLs (50 mg total) into feeding tube 3 (three) times daily.   pantoprazole 40 MG tablet Commonly known as: Protonix 1 po 30 mins prior to first meal What changed:   how much to take  how to take this  when to take this   scopolamine 1 MG/3DAYS Commonly known as: TRANSDERM-SCOP Place 1 patch (1.5 mg total) onto the skin every 3 (three) days. Start taking on: November 09, 2018   simvastatin 20 MG tablet Commonly known as: Emergency planning/management officer  Physician Discharge Summary   Patient ID: Logan Price MRN: [id number] DOB/AGE: 1945-09-02 73 y.o.  Admit date: 09/27/2018 Discharge date: 11/07/2018  Primary Care Physician:  Sharion Balloon, FNP   Recommendations for Outpatient Follow-up:  1. Follow up with PCP in 1-2 weeks 2. Patient completed #13 out of planned course of 35 XRT treatments today 3. Outpatient follow-up appointments as noted below 4. Follow-up chest CT in 12 months to demonstrate continued stability  Home Health: Home health PT OT, RN, home health aide, respiratory care Equipment/Devices: DME 3 n1, DME home O2, DME trach supplies, to feeding pump and tube feeds   Discharge Condition: stable  CODE STATUS: FULL  Diet recommendation:  Tube feeds Dillard [place] peptide 1.5 at 60 cc an hour (4.5 cans/day via pump), 30 mL pro-stat twice daily and 200 mL free water every 4 hours     Discharge Diagnoses:    . 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 PSVT (paroxysmal supraventricular tachycardia) (HCC) Moderate to severe protein calorie malnutrition Pseudomonas bacteremia Acute kidney injury secondary to urinary retention Transudative pleural effusion status post thoracentesis on 7/25 Essential hypertension Bilateral upper lobe solid pulmonary nodule Aortic atherosclerosis Emphysema by chest CT  Consults:      Admitted by ENT 7/3, Dr Benjamine Mola  Oncology: Dr. Maylon Peppers  Interventional radiology: Dr. Jacqualyn Posey  General surgery: Dr. Brantley Stage  Dental surgery: Dr. Dorothyann Gibbs  Cardiology: Dr. Harl Bowie   Infectious disease: Dr. Tommy Medal   Radiation oncology   Allergies:  No Known Allergies   DISCHARGE MEDICATIONS: Allergies as of 11/07/2018   No Known Allergies     Medication List    STOP taking these medications   amLODipine 10 MG tablet Commonly known as: NORVASC   aspirin EC 81 MG tablet   multivitamin with  minerals Tabs tablet     TAKE these medications   doxazosin 1 MG tablet Commonly known as: CARDURA Place 1 tablet (1 mg total) into feeding tube daily.   feeding supplement (PRO-STAT SUGAR FREE 64) Liqd Place 30 mLs into feeding tube 2 (two) times daily.   fluticasone 50 MCG/ACT nasal spray Commonly known as: FLONASE SPRAY 2 SPRAYS INTO EACH NOSTRIL EVERY DAY What changed: See the new instructions.   free water Soln Place 200 mLs into feeding tube every 4 (four) hours.   hydrocortisone 2.5 % rectal cream Commonly known as: ANUSOL-HC Apply 1 application topically 4 (four) times daily as needed for hemorrhoids or anal itching.   ipratropium-albuterol 0.5-2.5 (3) MG/3ML Soln Commonly known as: DUONEB Take 3 mLs by nebulization every 4 (four) hours as needed.   Dillard [place] Peptide 1.5 Liqd 325 mLs by PEG Tube route continuous.   loperamide HCl 1 MG/7.5ML suspension Commonly known as: IMODIUM Place 15 mLs (2 mg total) into feeding tube as needed for diarrhea or loose stools.   metoprolol tartrate 25 mg/10 mL Susp Commonly known as: LOPRESSOR Place 20 mLs (50 mg total) into feeding tube 3 (three) times daily.   pantoprazole 40 MG tablet Commonly known as: Protonix 1 po 30 mins prior to first meal What changed:   how much to take  how to take this  when to take this   scopolamine 1 MG/3DAYS Commonly known as: TRANSDERM-SCOP Place 1 patch (1.5 mg total) onto the skin every 3 (three) days. Start taking on: November 09, 2018   simvastatin 20 MG tablet Commonly known as: Emergency planning/management officer  Interval removal of PowerPort catheter. Tracheostomy tube noted with tip in stable position. 2.  Right base infiltrate with right-sided pleural effusion. Electronically Signed   By: Marcello Moores  Register   On: 10/18/2018 13:35   Dg Chest Port 1 View  Result Date: 10/11/2018 CLINICAL DATA:  Nausea and vomiting yesterday.  New tracheostomy. EXAM: PORTABLE CHEST 1 VIEW COMPARISON:  Chest CT 09/29/2018 FINDINGS: Tracheostomy tube appears in adequate position. Right IJ Port-A-Cath has tip over the SVC. Lungs are adequately inflated without focal airspace consolidation or effusion. No pneumothorax. Cardiomediastinal silhouette is within normal. Dense radiopaque material/calcifications over the posteromedial right lower lobe as seen on CT scan. Remainder of the exam is unchanged. IMPRESSION: No acute cardiopulmonary disease. Tracheostomy tube  and right IJ Port-A-Cath in adequate position. Electronically Signed   By: Marin Olp M.D.   On: 10/11/2018 15:40   Dg Abd Portable 1v  Result Date: 10/11/2018 CLINICAL DATA:  New tracheostomy tube. Nausea and vomiting yesterday. EXAM: PORTABLE ABDOMEN - 1 VIEW COMPARISON:  10/05/2018 FINDINGS: Percutaneous gastrostomy tube projects over the stomach in the left upper quadrant. Bowel gas pattern is nonobstructive. No free peritoneal air. Remainder of the exam is unchanged. IMPRESSION: Nonobstructive bowel gas pattern. Gastrostomy tube projects over the stomach in the left upper quadrant. Electronically Signed   By: Marin Olp M.D.   On: 10/11/2018 15:41   Dg Swallowing Func-speech Pathology  Result Date: 10/29/2018 Objective Swallowing Evaluation: Type of Study: MBS-Modified Barium Swallow Study  Patient Details Name: Logan Price MRN: [id number] Date of Birth: [birth year] Today's Date: 10/29/2018 Time: SLP Start Time (ACUTE ONLY): 2951 -SLP Stop Time (ACUTE ONLY): 0901 SLP Time Calculation (min) (ACUTE ONLY): 26 min Past Medical History: Past Medical History: Diagnosis Date . Diverticulitis  . 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 GERD (gastroesophageal reflux disease)  . Hepatitis C  . HTN (hypertension)  . Hyperlipidemia  Past Surgical History: Past Surgical History: Procedure Laterality Date . BIOPSY  05/04/2015  Procedure: BIOPSY;  Surgeon: Danie Binder, MD;  [facility]: [facility];  Service: Endoscopy;;  bx's of ileocecal valve  . COLONOSCOPY WITH PROPOFOL N/A 05/04/2015  Dr. Oneida Alar: normal appearing ileum with prominent IC valve with tubular adenomas, moderate diverticulosis in sigmoid colon, ascending colon, and retum. Moderate sized internal hemorrhoids. Surveillance in 5 years . ESOPHAGOGASTRODUODENOSCOPY (EGD) WITH PROPOFOL N/A 12/19/2016  Procedure: ESOPHAGOGASTRODUODENOSCOPY (EGD) WITH PROPOFOL;  Surgeon: Danie Binder, MD;  [facility];  Service: Endoscopy;   Laterality: N/A;  11:30am . FLEXIBLE SIGMOIDOSCOPY N/A 12/10/2015  hemorrhoid banding X 3  . HEMORRHOID BANDING N/A 12/10/2015  Procedure: HEMORRHOID BANDING;  Surgeon: Danie Binder, MD;  [facility];  Service: Endoscopy;  Laterality: N/A;  1:30 PM . IR GASTROSTOMY TUBE MOD SED  10/04/2018 . IR IMAGING GUIDED PORT INSERTION  10/04/2018 . IR REMOVAL TUN ACCESS W/ PORT W/O FL MOD SED  10/14/2018 . LAPAROSCOPIC INSERTION GASTROSTOMY TUBE Left 10/07/2018  Procedure: LAPAROSCOPIC  GASTROSTOMY TUBE;  Surgeon: Kinsinger, Arta Bruce, MD;  Location: [facility];  Service: General;  Laterality: Left; Marland Kitchen MICROLARYNGOSCOPY N/A 09/27/2018  Procedure: MICRO DIRECT LARYNGOSCOPY WITH BIOPSY;  Surgeon: Leta Baptist, MD;  [facility];  Service: ENT;  Laterality: N/A; . None to date    As of 04/14/15 . POLYPECTOMY  05/04/2015  Procedure: POLYPECTOMY;  Surgeon: Danie Binder, MD;  [facility];  Service: Endoscopy;;  descending colon polyp, ascending colon polyp . SAVORY DILATION N/A 12/19/2016  Interval removal of PowerPort catheter. Tracheostomy tube noted with tip in stable position. 2.  Right base infiltrate with right-sided pleural effusion. Electronically Signed   By: Marcello Moores  Register   On: 10/18/2018 13:35   Dg Chest Port 1 View  Result Date: 10/11/2018 CLINICAL DATA:  Nausea and vomiting yesterday.  New tracheostomy. EXAM: PORTABLE CHEST 1 VIEW COMPARISON:  Chest CT 09/29/2018 FINDINGS: Tracheostomy tube appears in adequate position. Right IJ Port-A-Cath has tip over the SVC. Lungs are adequately inflated without focal airspace consolidation or effusion. No pneumothorax. Cardiomediastinal silhouette is within normal. Dense radiopaque material/calcifications over the posteromedial right lower lobe as seen on CT scan. Remainder of the exam is unchanged. IMPRESSION: No acute cardiopulmonary disease. Tracheostomy tube  and right IJ Port-A-Cath in adequate position. Electronically Signed   By: Marin Olp M.D.   On: 10/11/2018 15:40   Dg Abd Portable 1v  Result Date: 10/11/2018 CLINICAL DATA:  New tracheostomy tube. Nausea and vomiting yesterday. EXAM: PORTABLE ABDOMEN - 1 VIEW COMPARISON:  10/05/2018 FINDINGS: Percutaneous gastrostomy tube projects over the stomach in the left upper quadrant. Bowel gas pattern is nonobstructive. No free peritoneal air. Remainder of the exam is unchanged. IMPRESSION: Nonobstructive bowel gas pattern. Gastrostomy tube projects over the stomach in the left upper quadrant. Electronically Signed   By: Marin Olp M.D.   On: 10/11/2018 15:41   Dg Swallowing Func-speech Pathology  Result Date: 10/29/2018 Objective Swallowing Evaluation: Type of Study: MBS-Modified Barium Swallow Study  Patient Details Name: Logan Price MRN: [id number] Date of Birth: [birth year] Today's Date: 10/29/2018 Time: SLP Start Time (ACUTE ONLY): 2951 -SLP Stop Time (ACUTE ONLY): 0901 SLP Time Calculation (min) (ACUTE ONLY): 26 min Past Medical History: Past Medical History: Diagnosis Date . Diverticulitis  . 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 GERD (gastroesophageal reflux disease)  . Hepatitis C  . HTN (hypertension)  . Hyperlipidemia  Past Surgical History: Past Surgical History: Procedure Laterality Date . BIOPSY  05/04/2015  Procedure: BIOPSY;  Surgeon: Danie Binder, MD;  [facility];  Service: Endoscopy;;  bx's of ileocecal valve  . COLONOSCOPY WITH PROPOFOL N/A 05/04/2015  Dr. Oneida Alar: normal appearing ileum with prominent IC valve with tubular adenomas, moderate diverticulosis in sigmoid colon, ascending colon, and retum. Moderate sized internal hemorrhoids. Surveillance in 5 years . ESOPHAGOGASTRODUODENOSCOPY (EGD) WITH PROPOFOL N/A 12/19/2016  Procedure: ESOPHAGOGASTRODUODENOSCOPY (EGD) WITH PROPOFOL;  Surgeon: Danie Binder, MD;  [facility];  Service: Endoscopy;   Laterality: N/A;  11:30am . FLEXIBLE SIGMOIDOSCOPY N/A 12/10/2015  hemorrhoid banding X 3  . HEMORRHOID BANDING N/A 12/10/2015  Procedure: HEMORRHOID BANDING;  Surgeon: Danie Binder, MD;  [facility];  Service: Endoscopy;  Laterality: N/A;  1:30 PM . IR GASTROSTOMY TUBE MOD SED  10/04/2018 . IR IMAGING GUIDED PORT INSERTION  10/04/2018 . IR REMOVAL TUN ACCESS W/ PORT W/O FL MOD SED  10/14/2018 . LAPAROSCOPIC INSERTION GASTROSTOMY TUBE Left 10/07/2018  Procedure: LAPAROSCOPIC  GASTROSTOMY TUBE;  Surgeon: Kinsinger, Arta Bruce, MD;  Location: [facility];  Service: General;  Laterality: Left; Marland Kitchen MICROLARYNGOSCOPY N/A 09/27/2018  Procedure: MICRO DIRECT LARYNGOSCOPY WITH BIOPSY;  Surgeon: Leta Baptist, MD;  Location: [facility] OR;  Service: ENT;  Laterality: N/A; . None to date    As of 04/14/15 . POLYPECTOMY  05/04/2015  Procedure: POLYPECTOMY;  Surgeon: Danie Binder, MD;  [facility];  Service: Endoscopy;;  descending colon polyp, ascending colon polyp . SAVORY DILATION N/A 12/19/2016

## 2018-11-07 NOTE — Telephone Encounter (Signed)
Oncology Nurse Navigator Documentation  In follow-up to pt's pending DC and need for transportation to [facility] for XRT appts, sent IB notification to Transportation Coordinator Drucie Ip requesting transportation.  She later confirmed she talked with Mr. Gombert and will coordinate rides upon his DC expected tomorrow.  Gayleen Orem, RN, BSN Head & Neck Oncology Nurse [place] at [place] [phone]

## 2018-11-07 NOTE — Plan of Care (Signed)
DS nurse reported that pt was not interested in learning any info re: his tube feeding.

## 2018-11-07 NOTE — Progress Notes (Signed)
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 PMH includes: HLD, HTN, hepatitis C, GERD, diverticulitis   OT comments  Pt with depressed demeanor requiring maximum encouragement for participation. Pt in dark room, not able to open blinds. Pt educated in importance of change of position for lung health, participation in ADL and instructed in UB exercises with level 2 theraband. Pt reports he is going home today and his brother has agreed to stay with him. Updated d/c recommendation to HHOT.   Follow Up Recommendations  Home health OT;Supervision/Assistance - 24 hour    Equipment Recommendations  3 in 1 bedside commode    Recommendations for Other Services      Precautions / Restrictions Precautions Precautions: Fall Precaution Comments: trach and Gtube, on RA       Mobility Bed Mobility Overal bed mobility: Modified Independent             General bed mobility comments: HOB elevated ~30*, used rail  Transfers                 General transfer comment: declined OOB to chair or ambulation despite encouragment    Balance Overall balance assessment: Needs assistance   Sitting balance-Leahy Scale: Good Sitting balance - Comments: no LOB at EOB with grooming                                   ADL either performed or assessed with clinical judgement   ADL Overall ADL's : Needs assistance/impaired     Grooming: Oral care;Sitting;Set up;Wash/dry face Grooming Details (indicate cue type and reason): used mouthwash and oral suction to rinse mouth                                     Vision       Perception     Praxis      Cognition Arousal/Alertness: Awake/alert Behavior During  Therapy: WFL for tasks assessed/performed Overall Cognitive Status: Within Functional Limits for tasks assessed                                          Exercises Exercises: General Upper Extremity General Exercises - Upper Extremity Shoulder Flexion: Strengthening;Both;5 reps;Supine;Theraband Theraband Level (Shoulder Flexion): Level 2 (Red) Shoulder Extension: Strengthening;Both;10 reps;Supine;Theraband Theraband Level (Shoulder Extension): Level 2 (Red) Shoulder Horizontal ABduction: Strengthening;Both;10 reps;Supine;Theraband Elbow Flexion: Strengthening;Both;5 reps;Supine;Theraband Theraband Level (Elbow Flexion): Level 2 (Red) Elbow Extension: Strengthening;Both;5 reps;Theraband Theraband Level (Elbow Extension): Level 2 (Red)   Shoulder Instructions       General Comments      Pertinent Vitals/ Pain       Pain Assessment: No/denies pain  Home Living                                          Prior Functioning/Environment              Frequency  Min 2X/week  Progress Toward Goals  OT Goals(current goals can now be found in the care plan section)  Progress towards OT goals: Progressing toward goals  Acute Rehab OT Goals Patient Stated Goal: to go home with his brother's assist OT Goal Formulation: With patient Time For Goal Achievement: 10/30/18 Potential to Achieve Goals: Good  Plan Discharge plan needs to be updated    Co-evaluation                 AM-PAC OT "6 Clicks" Daily Activity     Outcome Measure   Help from another person eating meals?: Total Help from another person taking care of personal grooming?: A Little Help from another person toileting, which includes using toliet, bedpan, or urinal?: A Little Help from another person bathing (including washing, rinsing, drying)?: A Little Help from another person to put on and taking off regular upper body clothing?: A Little Help from another  person to put on and taking off regular lower body clothing?: A Little 6 Click Score: 16    End of Session    OT Visit Diagnosis: Unsteadiness on feet (R26.81);Muscle weakness (generalized) (M62.81)   Activity Tolerance Patient tolerated treatment well   Patient Left in bed;with call bell/phone within reach   Nurse Communication          Time: 0812-0828 OT Time Calculation (min): 16 min  Charges: OT General Charges $OT Visit: 1 Visit OT Treatments $Therapeutic Exercise: 8-22 mins  Martie Round, OTR/L Acute Rehabilitation Services Pager: [phone] Office: [phone]   Evern Bio 11/07/2018, 8:42 AM

## 2018-11-07 NOTE — Progress Notes (Signed)
[facility] Therapy Treatment Record Phone [phone]   Radiation Therapy was administered to Logan Price on: 11/07/2018  10:42 AM and was treatment # 13 out of a planned course of 35 treatments.  Radiation Treatment  1). Beam photons with 6-10 energy  2). Brachytherapy None  3). Stereotactic Radiosurgery None  4). Other Radiation None     Para [place]

## 2018-11-07 NOTE — Progress Notes (Signed)
Oncology Nurse Navigator Documentation  Visited Logan Price [id number] to check on his well-being. He reported toleration of XRT w/o issues, denied concerns. He confirmed Transportation Coordinator Drucie Ip had contacted him, voiced understanding she will arrange his rides to [facility] for future XRT appts upon his return home today. I again provided him my contact information, encouraged him to contact me with needs/concerns.  He agreed.  Gayleen Orem, RN, BSN Head & Neck Oncology Nurse [place] at [place] [phone]

## 2018-11-08 ENCOUNTER — Ambulatory Visit
Admit: 2018-11-08 | Discharge: 2018-11-08 | Disposition: A | Discharge status: Home / Self Care | Payer: Medicare Other | Attending: Radiation Oncology | Admitting: Radiation Oncology

## 2018-11-08 DIAGNOSIS — B965 Pseudomonas (aeruginosa) (mallei) (pseudomallei) as the cause of diseases classified elsewhere: Secondary | ICD-10-CM | POA: Diagnosis not present

## 2018-11-08 DIAGNOSIS — E43 Unspecified severe protein-calorie malnutrition: Secondary | ICD-10-CM | POA: Diagnosis not present

## 2018-11-08 DIAGNOSIS — C320 Malignant neoplasm of glottis: Secondary | ICD-10-CM | POA: Diagnosis not present

## 2018-11-08 DIAGNOSIS — C328 Malignant neoplasm of overlapping sites of larynx: Secondary | ICD-10-CM | POA: Diagnosis not present

## 2018-11-08 DIAGNOSIS — T827XXD Infection and inflammatory reaction due to other cardiac and vascular devices, implants and grafts, subsequent encounter: Secondary | ICD-10-CM | POA: Diagnosis not present

## 2018-11-08 DIAGNOSIS — R1319 Other dysphagia: Secondary | ICD-10-CM | POA: Diagnosis not present

## 2018-11-08 LAB — GLUCOSE, CAPILLARY
Glucose-Capillary: 114 mg/dL — ABNORMAL HIGH (ref 70–99)
Glucose-Capillary: 120 mg/dL — ABNORMAL HIGH (ref 70–99)
Glucose-Capillary: 120 mg/dL — ABNORMAL HIGH (ref 70–99)
Glucose-Capillary: 122 mg/dL — ABNORMAL HIGH (ref 70–99)

## 2018-11-08 NOTE — Progress Notes (Signed)
[facility]                                                                              Patient Demographics  Logan Price, is a 73 y.o. male, DOB - Jul 30, 1945, GNO:[id number]  Admit date - 09/27/2018   Admitting Physician Logan Filler, MD  Outpatient Primary MD for the patient is Logan Balloon, FNP  Outpatient specialists:   LOS - 42  days   Medical records reviewed and are as summarized below:    No chief complaint on file.      Brief summary  73 year old with history of GERD, essential hypertension, hyperlipidemia who recently diagnosed neck mass concerning for malignancy was admitted to the hospital. Overnight patient went into SVT therefore medical team was consulted. Patient was given adenosine x1 which converted back to normal sinus rhythm. G-tube placed by surgery 7/13, tolerating tube feeds. Metoprolol was started via PEG tube. Hospitals course was later on complicated by Pseudomonas bacteremia secondary to infected Port-A-Cath. Patient was transferred to medicine service. Infectious disease team were consulted who recommended removing Port-A-Cath.    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  Currently on 7 weeks radiation cycles. -Status post trach placement and G-tube placement 7/13, currently on tube feeds -Completed IV antibiotics on 8/4 -Patient completed #13 of planned 35 XRT on 8/13, plan for #14 XRT today and then DC home with home health.    Severe protein calorie malnutrition with dysphagia, PEG tube, associated diarrhea.  -Continue tube feeds, PEG tube was dislodged on 8/4 and 8/8 and was replaced on 8/5 and 8/9 respectively. -Follow-up with Dr. Kieth Price in outpatient  setting -For home orders: Peptide 1.5 at 60 cc an hour via pump 30 mL pro-stat over equal and twice daily, 200 cc free water every 4 hours -DME home health for tube feeding arranged by the case management   Pseudomonas bacteremia -Likely source Port-A-Cath, removed on 10/14/2018. -ID recommended IV cefepime for total 2 weeks post Port-A-Cath removal, finished course on 10/29/2018.  Repeat blood cultures negative -Continue dressing changes as needed  Acute kidney injury secondary to acute urinary retention -Creatinine 5.3 on 7/20, creatinine improved after placing Foley catheter, now stable -Patient had failed previous voiding trial and Foley catheter had to be replaced. -Continue doxazosin, outpatient follow-up with urology   Paroxysmal SVT, intermittent and recurrent, reentry tachycardia -Seen by cardiology, 2D echo unremarkable, TSH WNL -Follow-up with Dr. Bronson Price as outpatient, Continue Lopressor 50 mg per tube 3 times daily, was not able to tolerate 4 times daily dose  Transudative pleural effusion, pneumonia -Status post ultrasound-guided thoracentesis 7/25 with 450 cc removed -Patient has completed full course of IV cefepime for 2 weeks on 8/4 -Acute issues appear to be resolved  Essential hypertension Currently stable  Bilateral upper lobe solid pulmonary nodules - largest 6 mm, all stable since 02/22/2018 neck CT, more likely benign - follow-up chest CT in  hand hygiene and skin antiseptic. A timeout was performed prior to the initiation of the procedure. Fluoroscopic inspection demonstrated T tacks, normal bowel gas pattern. Injection demonstrates tip of the gastrostomy catheter in the lumen of the stomach. The Price was not inflated. The Price was then inflated with 5 mL sterile saline in confirmed the within the gastric lumen. No evidence of extravasation. IMPRESSION: 1. The replacement Price retention catheter is appropriately positioned within the gastric lumen, okay for routine use. Electronically Signed   By: Logan Price M.D.   On: 10/30/2018 16:41   Dg Chest Port 1 View  Result Date: 10/18/2018 CLINICAL DATA:  Shortness of breath. EXAM: PORTABLE CHEST 1 VIEW COMPARISON:  10/11/2018. FINDINGS: Interval removal of PowerPort catheter. Tracheostomy tube noted in stable position. Heart size normal. Right base infiltrate with right-sided pleural effusion. No pneumothorax. IMPRESSION: 1. Interval removal of PowerPort catheter. Tracheostomy tube noted with tip in stable position. 2.  Right base infiltrate with right-sided pleural effusion. Electronically Signed   By: Logan Price  Register   On: 10/18/2018 13:35   Dg Chest Port 1 View  Result Date: 10/11/2018 CLINICAL DATA:  Nausea and vomiting yesterday.  New tracheostomy. EXAM: PORTABLE CHEST 1 VIEW COMPARISON:  Chest CT 09/29/2018 FINDINGS: Tracheostomy tube appears in adequate position. Right IJ Port-A-Cath has tip over the SVC. Lungs are adequately inflated without focal airspace consolidation or effusion. No pneumothorax. Cardiomediastinal silhouette is within normal. Dense radiopaque material/calcifications over the posteromedial right lower lobe as seen on CT scan. Remainder of the exam is unchanged. IMPRESSION: No acute  cardiopulmonary disease. Tracheostomy tube and right IJ Port-A-Cath in adequate position. Electronically Signed   By: Marin Olp M.D.   On: 10/11/2018 15:40   Dg Abd Portable 1v  Result Date: 10/11/2018 CLINICAL DATA:  New tracheostomy tube. Nausea and vomiting yesterday. EXAM: PORTABLE ABDOMEN - 1 VIEW COMPARISON:  10/05/2018 FINDINGS: Percutaneous gastrostomy tube projects over the stomach in the left upper quadrant. Bowel gas pattern is nonobstructive. No free peritoneal air. Remainder of the exam is unchanged. IMPRESSION: Nonobstructive bowel gas pattern. Gastrostomy tube projects over the stomach in the left upper quadrant. Electronically Signed   By: Marin Olp M.D.   On: 10/11/2018 15:41   Dg Swallowing Func-speech Pathology  Result Date: 10/29/2018 Objective Swallowing Evaluation: Type of Study: MBS-Modified Barium Swallow Study  Patient Details Name: Logan Price MRN: [id number] Date of Birth: April 09, 1945 Today's Date: 10/29/2018 Time: SLP Start Time (ACUTE ONLY): 0867 -SLP Stop Time (ACUTE ONLY): 0901 SLP Time Calculation (min) (ACUTE ONLY): 26 min Past Medical History: Past Medical History: Diagnosis Date  Diverticulitis   False positive serological test for hepatitis C 12/13/2016  GERD (gastroesophageal reflux disease)   Hepatitis C   HTN (hypertension)   Hyperlipidemia  Past Surgical History: Past Surgical History: Procedure Laterality Date  BIOPSY  05/04/2015  Procedure: BIOPSY;  Surgeon: Danie Binder, MD;  [facility];  Service: Endoscopy;;  bx's of ileocecal valve   COLONOSCOPY WITH PROPOFOL N/A 05/04/2015  Dr. Oneida Alar: normal appearing ileum with prominent IC valve with tubular adenomas, moderate diverticulosis in sigmoid colon, ascending colon, and retum. Moderate sized internal hemorrhoids. Surveillance in 5 years  ESOPHAGOGASTRODUODENOSCOPY (EGD) WITH PROPOFOL N/A 12/19/2016  Procedure: ESOPHAGOGASTRODUODENOSCOPY (EGD) WITH PROPOFOL;  Surgeon: Danie Binder, MD;   [facility];  Service: Endoscopy;  Laterality: N/A;  11:30am  FLEXIBLE SIGMOIDOSCOPY N/A 12/10/2015  hemorrhoid banding X 3   HEMORRHOID BANDING N/A 12/10/2015  Procedure: HEMORRHOID BANDING;  hand hygiene and skin antiseptic. A timeout was performed prior to the initiation of the procedure. Fluoroscopic inspection demonstrated T tacks, normal bowel gas pattern. Injection demonstrates tip of the gastrostomy catheter in the lumen of the stomach. The Price was not inflated. The Price was then inflated with 5 mL sterile saline in confirmed the within the gastric lumen. No evidence of extravasation. IMPRESSION: 1. The replacement Price retention catheter is appropriately positioned within the gastric lumen, okay for routine use. Electronically Signed   By: Logan Price M.D.   On: 10/30/2018 16:41   Dg Chest Port 1 View  Result Date: 10/18/2018 CLINICAL DATA:  Shortness of breath. EXAM: PORTABLE CHEST 1 VIEW COMPARISON:  10/11/2018. FINDINGS: Interval removal of PowerPort catheter. Tracheostomy tube noted in stable position. Heart size normal. Right base infiltrate with right-sided pleural effusion. No pneumothorax. IMPRESSION: 1. Interval removal of PowerPort catheter. Tracheostomy tube noted with tip in stable position. 2.  Right base infiltrate with right-sided pleural effusion. Electronically Signed   By: Logan Price  Register   On: 10/18/2018 13:35   Dg Chest Port 1 View  Result Date: 10/11/2018 CLINICAL DATA:  Nausea and vomiting yesterday.  New tracheostomy. EXAM: PORTABLE CHEST 1 VIEW COMPARISON:  Chest CT 09/29/2018 FINDINGS: Tracheostomy tube appears in adequate position. Right IJ Port-A-Cath has tip over the SVC. Lungs are adequately inflated without focal airspace consolidation or effusion. No pneumothorax. Cardiomediastinal silhouette is within normal. Dense radiopaque material/calcifications over the posteromedial right lower lobe as seen on CT scan. Remainder of the exam is unchanged. IMPRESSION: No acute  cardiopulmonary disease. Tracheostomy tube and right IJ Port-A-Cath in adequate position. Electronically Signed   By: Marin Olp M.D.   On: 10/11/2018 15:40   Dg Abd Portable 1v  Result Date: 10/11/2018 CLINICAL DATA:  New tracheostomy tube. Nausea and vomiting yesterday. EXAM: PORTABLE ABDOMEN - 1 VIEW COMPARISON:  10/05/2018 FINDINGS: Percutaneous gastrostomy tube projects over the stomach in the left upper quadrant. Bowel gas pattern is nonobstructive. No free peritoneal air. Remainder of the exam is unchanged. IMPRESSION: Nonobstructive bowel gas pattern. Gastrostomy tube projects over the stomach in the left upper quadrant. Electronically Signed   By: Marin Olp M.D.   On: 10/11/2018 15:41   Dg Swallowing Func-speech Pathology  Result Date: 10/29/2018 Objective Swallowing Evaluation: Type of Study: MBS-Modified Barium Swallow Study  Patient Details Name: Logan Price MRN: [id number] Date of Birth: April 09, 1945 Today's Date: 10/29/2018 Time: SLP Start Time (ACUTE ONLY): 0867 -SLP Stop Time (ACUTE ONLY): 0901 SLP Time Calculation (min) (ACUTE ONLY): 26 min Past Medical History: Past Medical History: Diagnosis Date  Diverticulitis   False positive serological test for hepatitis C 12/13/2016  GERD (gastroesophageal reflux disease)   Hepatitis C   HTN (hypertension)   Hyperlipidemia  Past Surgical History: Past Surgical History: Procedure Laterality Date  BIOPSY  05/04/2015  Procedure: BIOPSY;  Surgeon: Danie Binder, MD;  [facility]: [facility];  Service: Endoscopy;;  bx's of ileocecal valve   COLONOSCOPY WITH PROPOFOL N/A 05/04/2015  Dr. Oneida Alar: normal appearing ileum with prominent IC valve with tubular adenomas, moderate diverticulosis in sigmoid colon, ascending colon, and retum. Moderate sized internal hemorrhoids. Surveillance in 5 years  ESOPHAGOGASTRODUODENOSCOPY (EGD) WITH PROPOFOL N/A 12/19/2016  Procedure: ESOPHAGOGASTRODUODENOSCOPY (EGD) WITH PROPOFOL;  Surgeon: Danie Binder, MD;   [facility];  Service: Endoscopy;  Laterality: N/A;  11:30am  FLEXIBLE SIGMOIDOSCOPY N/A 12/10/2015  hemorrhoid banding X 3   HEMORRHOID BANDING N/A 12/10/2015  Procedure: HEMORRHOID BANDING;  hand hygiene and skin antiseptic. A timeout was performed prior to the initiation of the procedure. Fluoroscopic inspection demonstrated T tacks, normal bowel gas pattern. Injection demonstrates tip of the gastrostomy catheter in the lumen of the stomach. The Price was not inflated. The Price was then inflated with 5 mL sterile saline in confirmed the within the gastric lumen. No evidence of extravasation. IMPRESSION: 1. The replacement Price retention catheter is appropriately positioned within the gastric lumen, okay for routine use. Electronically Signed   By: Logan Price M.D.   On: 10/30/2018 16:41   Dg Chest Port 1 View  Result Date: 10/18/2018 CLINICAL DATA:  Shortness of breath. EXAM: PORTABLE CHEST 1 VIEW COMPARISON:  10/11/2018. FINDINGS: Interval removal of PowerPort catheter. Tracheostomy tube noted in stable position. Heart size normal. Right base infiltrate with right-sided pleural effusion. No pneumothorax. IMPRESSION: 1. Interval removal of PowerPort catheter. Tracheostomy tube noted with tip in stable position. 2.  Right base infiltrate with right-sided pleural effusion. Electronically Signed   By: Logan Price  Register   On: 10/18/2018 13:35   Dg Chest Port 1 View  Result Date: 10/11/2018 CLINICAL DATA:  Nausea and vomiting yesterday.  New tracheostomy. EXAM: PORTABLE CHEST 1 VIEW COMPARISON:  Chest CT 09/29/2018 FINDINGS: Tracheostomy tube appears in adequate position. Right IJ Port-A-Cath has tip over the SVC. Lungs are adequately inflated without focal airspace consolidation or effusion. No pneumothorax. Cardiomediastinal silhouette is within normal. Dense radiopaque material/calcifications over the posteromedial right lower lobe as seen on CT scan. Remainder of the exam is unchanged. IMPRESSION: No acute  cardiopulmonary disease. Tracheostomy tube and right IJ Port-A-Cath in adequate position. Electronically Signed   By: Marin Olp M.D.   On: 10/11/2018 15:40   Dg Abd Portable 1v  Result Date: 10/11/2018 CLINICAL DATA:  New tracheostomy tube. Nausea and vomiting yesterday. EXAM: PORTABLE ABDOMEN - 1 VIEW COMPARISON:  10/05/2018 FINDINGS: Percutaneous gastrostomy tube projects over the stomach in the left upper quadrant. Bowel gas pattern is nonobstructive. No free peritoneal air. Remainder of the exam is unchanged. IMPRESSION: Nonobstructive bowel gas pattern. Gastrostomy tube projects over the stomach in the left upper quadrant. Electronically Signed   By: Marin Olp M.D.   On: 10/11/2018 15:41   Dg Swallowing Func-speech Pathology  Result Date: 10/29/2018 Objective Swallowing Evaluation: Type of Study: MBS-Modified Barium Swallow Study  Patient Details Name: Logan Price MRN: [id number] Date of Birth: April 09, 1945 Today's Date: 10/29/2018 Time: SLP Start Time (ACUTE ONLY): 0867 -SLP Stop Time (ACUTE ONLY): 0901 SLP Time Calculation (min) (ACUTE ONLY): 26 min Past Medical History: Past Medical History: Diagnosis Date  Diverticulitis   False positive serological test for hepatitis C 12/13/2016  GERD (gastroesophageal reflux disease)   Hepatitis C   HTN (hypertension)   Hyperlipidemia  Past Surgical History: Past Surgical History: Procedure Laterality Date  BIOPSY  05/04/2015  Procedure: BIOPSY;  Surgeon: Danie Binder, MD;  [facility]: [facility];  Service: Endoscopy;;  bx's of ileocecal valve   COLONOSCOPY WITH PROPOFOL N/A 05/04/2015  Dr. Oneida Alar: normal appearing ileum with prominent IC valve with tubular adenomas, moderate diverticulosis in sigmoid colon, ascending colon, and retum. Moderate sized internal hemorrhoids. Surveillance in 5 years  ESOPHAGOGASTRODUODENOSCOPY (EGD) WITH PROPOFOL N/A 12/19/2016  Procedure: ESOPHAGOGASTRODUODENOSCOPY (EGD) WITH PROPOFOL;  Surgeon: Danie Binder, MD;   [facility];  Service: Endoscopy;  Laterality: N/A;  11:30am  FLEXIBLE SIGMOIDOSCOPY N/A 12/10/2015  hemorrhoid banding X 3   HEMORRHOID BANDING N/A 12/10/2015  Procedure: HEMORRHOID BANDING;  hand hygiene and skin antiseptic. A timeout was performed prior to the initiation of the procedure. Fluoroscopic inspection demonstrated T tacks, normal bowel gas pattern. Injection demonstrates tip of the gastrostomy catheter in the lumen of the stomach. The Price was not inflated. The Price was then inflated with 5 mL sterile saline in confirmed the within the gastric lumen. No evidence of extravasation. IMPRESSION: 1. The replacement Price retention catheter is appropriately positioned within the gastric lumen, okay for routine use. Electronically Signed   By: Logan Price M.D.   On: 10/30/2018 16:41   Dg Chest Port 1 View  Result Date: 10/18/2018 CLINICAL DATA:  Shortness of breath. EXAM: PORTABLE CHEST 1 VIEW COMPARISON:  10/11/2018. FINDINGS: Interval removal of PowerPort catheter. Tracheostomy tube noted in stable position. Heart size normal. Right base infiltrate with right-sided pleural effusion. No pneumothorax. IMPRESSION: 1. Interval removal of PowerPort catheter. Tracheostomy tube noted with tip in stable position. 2.  Right base infiltrate with right-sided pleural effusion. Electronically Signed   By: Logan Price  Register   On: 10/18/2018 13:35   Dg Chest Port 1 View  Result Date: 10/11/2018 CLINICAL DATA:  Nausea and vomiting yesterday.  New tracheostomy. EXAM: PORTABLE CHEST 1 VIEW COMPARISON:  Chest CT 09/29/2018 FINDINGS: Tracheostomy tube appears in adequate position. Right IJ Port-A-Cath has tip over the SVC. Lungs are adequately inflated without focal airspace consolidation or effusion. No pneumothorax. Cardiomediastinal silhouette is within normal. Dense radiopaque material/calcifications over the posteromedial right lower lobe as seen on CT scan. Remainder of the exam is unchanged. IMPRESSION: No acute  cardiopulmonary disease. Tracheostomy tube and right IJ Port-A-Cath in adequate position. Electronically Signed   By: Marin Olp M.D.   On: 10/11/2018 15:40   Dg Abd Portable 1v  Result Date: 10/11/2018 CLINICAL DATA:  New tracheostomy tube. Nausea and vomiting yesterday. EXAM: PORTABLE ABDOMEN - 1 VIEW COMPARISON:  10/05/2018 FINDINGS: Percutaneous gastrostomy tube projects over the stomach in the left upper quadrant. Bowel gas pattern is nonobstructive. No free peritoneal air. Remainder of the exam is unchanged. IMPRESSION: Nonobstructive bowel gas pattern. Gastrostomy tube projects over the stomach in the left upper quadrant. Electronically Signed   By: Marin Olp M.D.   On: 10/11/2018 15:41   Dg Swallowing Func-speech Pathology  Result Date: 10/29/2018 Objective Swallowing Evaluation: Type of Study: MBS-Modified Barium Swallow Study  Patient Details Name: Logan Price MRN: [id number] Date of Birth: April 09, 1945 Today's Date: 10/29/2018 Time: SLP Start Time (ACUTE ONLY): 0867 -SLP Stop Time (ACUTE ONLY): 0901 SLP Time Calculation (min) (ACUTE ONLY): 26 min Past Medical History: Past Medical History: Diagnosis Date  Diverticulitis   False positive serological test for hepatitis C 12/13/2016  GERD (gastroesophageal reflux disease)   Hepatitis C   HTN (hypertension)   Hyperlipidemia  Past Surgical History: Past Surgical History: Procedure Laterality Date  BIOPSY  05/04/2015  Procedure: BIOPSY;  Surgeon: Danie Binder, MD;  [facility];  Service: Endoscopy;;  bx's of ileocecal valve   COLONOSCOPY WITH PROPOFOL N/A 05/04/2015  Dr. Oneida Alar: normal appearing ileum with prominent IC valve with tubular adenomas, moderate diverticulosis in sigmoid colon, ascending colon, and retum. Moderate sized internal hemorrhoids. Surveillance in 5 years  ESOPHAGOGASTRODUODENOSCOPY (EGD) WITH PROPOFOL N/A 12/19/2016  Procedure: ESOPHAGOGASTRODUODENOSCOPY (EGD) WITH PROPOFOL;  Surgeon: Danie Binder, MD;   [facility];  Service: Endoscopy;  Laterality: N/A;  11:30am  FLEXIBLE SIGMOIDOSCOPY N/A 12/10/2015  hemorrhoid banding X 3   HEMORRHOID BANDING N/A 12/10/2015  Procedure: HEMORRHOID BANDING;  [facility]                                                                              Patient Demographics  Logan Price, is a 73 y.o. male, DOB - Jul 30, 1945, GNO:[id number]  Admit date - 09/27/2018   Admitting Physician Logan Filler, MD  Outpatient Primary MD for the patient is Logan Balloon, FNP  Outpatient specialists:   LOS - 42  days   Medical records reviewed and are as summarized below:    No chief complaint on file.      Brief summary  73 year old with history of GERD, essential hypertension, hyperlipidemia who recently diagnosed neck mass concerning for malignancy was admitted to the hospital. Overnight patient went into SVT therefore medical team was consulted. Patient was given adenosine x1 which converted back to normal sinus rhythm. G-tube placed by surgery 7/13, tolerating tube feeds. Metoprolol was started via PEG tube. Hospitals course was later on complicated by Pseudomonas bacteremia secondary to infected Port-A-Cath. Patient was transferred to medicine service. Infectious disease team were consulted who recommended removing Port-A-Cath.    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  Currently on 7 weeks radiation cycles. -Status post trach placement and G-tube placement 7/13, currently on tube feeds -Completed IV antibiotics on 8/4 -Patient completed #13 of planned 35 XRT on 8/13, plan for #14 XRT today and then DC home with home health.    Severe protein calorie malnutrition with dysphagia, PEG tube, associated diarrhea.  -Continue tube feeds, PEG tube was dislodged on 8/4 and 8/8 and was replaced on 8/5 and 8/9 respectively. -Follow-up with Dr. Kieth Price in outpatient  setting -For home orders: Peptide 1.5 at 60 cc an hour via pump 30 mL pro-stat over equal and twice daily, 200 cc free water every 4 hours -DME home health for tube feeding arranged by the case management   Pseudomonas bacteremia -Likely source Port-A-Cath, removed on 10/14/2018. -ID recommended IV cefepime for total 2 weeks post Port-A-Cath removal, finished course on 10/29/2018.  Repeat blood cultures negative -Continue dressing changes as needed  Acute kidney injury secondary to acute urinary retention -Creatinine 5.3 on 7/20, creatinine improved after placing Foley catheter, now stable -Patient had failed previous voiding trial and Foley catheter had to be replaced. -Continue doxazosin, outpatient follow-up with urology   Paroxysmal SVT, intermittent and recurrent, reentry tachycardia -Seen by cardiology, 2D echo unremarkable, TSH WNL -Follow-up with Dr. Bronson Price as outpatient, Continue Lopressor 50 mg per tube 3 times daily, was not able to tolerate 4 times daily dose  Transudative pleural effusion, pneumonia -Status post ultrasound-guided thoracentesis 7/25 with 450 cc removed -Patient has completed full course of IV cefepime for 2 weeks on 8/4 -Acute issues appear to be resolved  Essential hypertension Currently stable  Bilateral upper lobe solid pulmonary nodules - largest 6 mm, all stable since 02/22/2018 neck CT, more likely benign - follow-up chest CT in  [facility]                                                                              Patient Demographics  Logan Price, is a 73 y.o. male, DOB - Jul 30, 1945, GNO:[id number]  Admit date - 09/27/2018   Admitting Physician Logan Filler, MD  Outpatient Primary MD for the patient is Logan Balloon, FNP  Outpatient specialists:   LOS - 42  days   Medical records reviewed and are as summarized below:    No chief complaint on file.      Brief summary  73 year old with history of GERD, essential hypertension, hyperlipidemia who recently diagnosed neck mass concerning for malignancy was admitted to the hospital. Overnight patient went into SVT therefore medical team was consulted. Patient was given adenosine x1 which converted back to normal sinus rhythm. G-tube placed by surgery 7/13, tolerating tube feeds. Metoprolol was started via PEG tube. Hospitals course was later on complicated by Pseudomonas bacteremia secondary to infected Port-A-Cath. Patient was transferred to medicine service. Infectious disease team were consulted who recommended removing Port-A-Cath.    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  Currently on 7 weeks radiation cycles. -Status post trach placement and G-tube placement 7/13, currently on tube feeds -Completed IV antibiotics on 8/4 -Patient completed #13 of planned 35 XRT on 8/13, plan for #14 XRT today and then DC home with home health.    Severe protein calorie malnutrition with dysphagia, PEG tube, associated diarrhea.  -Continue tube feeds, PEG tube was dislodged on 8/4 and 8/8 and was replaced on 8/5 and 8/9 respectively. -Follow-up with Dr. Kieth Price in outpatient  setting -For home orders: Peptide 1.5 at 60 cc an hour via pump 30 mL pro-stat over equal and twice daily, 200 cc free water every 4 hours -DME home health for tube feeding arranged by the case management   Pseudomonas bacteremia -Likely source Port-A-Cath, removed on 10/14/2018. -ID recommended IV cefepime for total 2 weeks post Port-A-Cath removal, finished course on 10/29/2018.  Repeat blood cultures negative -Continue dressing changes as needed  Acute kidney injury secondary to acute urinary retention -Creatinine 5.3 on 7/20, creatinine improved after placing Foley catheter, now stable -Patient had failed previous voiding trial and Foley catheter had to be replaced. -Continue doxazosin, outpatient follow-up with urology   Paroxysmal SVT, intermittent and recurrent, reentry tachycardia -Seen by cardiology, 2D echo unremarkable, TSH WNL -Follow-up with Dr. Bronson Price as outpatient, Continue Lopressor 50 mg per tube 3 times daily, was not able to tolerate 4 times daily dose  Transudative pleural effusion, pneumonia -Status post ultrasound-guided thoracentesis 7/25 with 450 cc removed -Patient has completed full course of IV cefepime for 2 weeks on 8/4 -Acute issues appear to be resolved  Essential hypertension Currently stable  Bilateral upper lobe solid pulmonary nodules - largest 6 mm, all stable since 02/22/2018 neck CT, more likely benign - follow-up chest CT in  hand hygiene and skin antiseptic. A timeout was performed prior to the initiation of the procedure. Fluoroscopic inspection demonstrated T tacks, normal bowel gas pattern. Injection demonstrates tip of the gastrostomy catheter in the lumen of the stomach. The Price was not inflated. The Price was then inflated with 5 mL sterile saline in confirmed the within the gastric lumen. No evidence of extravasation. IMPRESSION: 1. The replacement Price retention catheter is appropriately positioned within the gastric lumen, okay for routine use. Electronically Signed   By: Logan Price M.D.   On: 10/30/2018 16:41   Dg Chest Port 1 View  Result Date: 10/18/2018 CLINICAL DATA:  Shortness of breath. EXAM: PORTABLE CHEST 1 VIEW COMPARISON:  10/11/2018. FINDINGS: Interval removal of PowerPort catheter. Tracheostomy tube noted in stable position. Heart size normal. Right base infiltrate with right-sided pleural effusion. No pneumothorax. IMPRESSION: 1. Interval removal of PowerPort catheter. Tracheostomy tube noted with tip in stable position. 2.  Right base infiltrate with right-sided pleural effusion. Electronically Signed   By: Logan Price  Register   On: 10/18/2018 13:35   Dg Chest Port 1 View  Result Date: 10/11/2018 CLINICAL DATA:  Nausea and vomiting yesterday.  New tracheostomy. EXAM: PORTABLE CHEST 1 VIEW COMPARISON:  Chest CT 09/29/2018 FINDINGS: Tracheostomy tube appears in adequate position. Right IJ Port-A-Cath has tip over the SVC. Lungs are adequately inflated without focal airspace consolidation or effusion. No pneumothorax. Cardiomediastinal silhouette is within normal. Dense radiopaque material/calcifications over the posteromedial right lower lobe as seen on CT scan. Remainder of the exam is unchanged. IMPRESSION: No acute  cardiopulmonary disease. Tracheostomy tube and right IJ Port-A-Cath in adequate position. Electronically Signed   By: Marin Olp M.D.   On: 10/11/2018 15:40   Dg Abd Portable 1v  Result Date: 10/11/2018 CLINICAL DATA:  New tracheostomy tube. Nausea and vomiting yesterday. EXAM: PORTABLE ABDOMEN - 1 VIEW COMPARISON:  10/05/2018 FINDINGS: Percutaneous gastrostomy tube projects over the stomach in the left upper quadrant. Bowel gas pattern is nonobstructive. No free peritoneal air. Remainder of the exam is unchanged. IMPRESSION: Nonobstructive bowel gas pattern. Gastrostomy tube projects over the stomach in the left upper quadrant. Electronically Signed   By: Marin Olp M.D.   On: 10/11/2018 15:41   Dg Swallowing Func-speech Pathology  Result Date: 10/29/2018 Objective Swallowing Evaluation: Type of Study: MBS-Modified Barium Swallow Study  Patient Details Name: Logan Price MRN: [id number] Date of Birth: April 09, 1945 Today's Date: 10/29/2018 Time: SLP Start Time (ACUTE ONLY): 0867 -SLP Stop Time (ACUTE ONLY): 0901 SLP Time Calculation (min) (ACUTE ONLY): 26 min Past Medical History: Past Medical History: Diagnosis Date  Diverticulitis   False positive serological test for hepatitis C 12/13/2016  GERD (gastroesophageal reflux disease)   Hepatitis C   HTN (hypertension)   Hyperlipidemia  Past Surgical History: Past Surgical History: Procedure Laterality Date  BIOPSY  05/04/2015  Procedure: BIOPSY;  Surgeon: Danie Binder, MD;  [facility];  Service: Endoscopy;;  bx's of ileocecal valve   COLONOSCOPY WITH PROPOFOL N/A 05/04/2015  Dr. Oneida Alar: normal appearing ileum with prominent IC valve with tubular adenomas, moderate diverticulosis in sigmoid colon, ascending colon, and retum. Moderate sized internal hemorrhoids. Surveillance in 5 years  ESOPHAGOGASTRODUODENOSCOPY (EGD) WITH PROPOFOL N/A 12/19/2016  Procedure: ESOPHAGOGASTRODUODENOSCOPY (EGD) WITH PROPOFOL;  Surgeon: Danie Binder, MD;   [facility];  Service: Endoscopy;  Laterality: N/A;  11:30am  FLEXIBLE SIGMOIDOSCOPY N/A 12/10/2015  hemorrhoid banding X 3   HEMORRHOID BANDING N/A 12/10/2015  Procedure: HEMORRHOID BANDING;

## 2018-11-08 NOTE — Plan of Care (Signed)
  Problem: Health Behavior/Discharge Planning: Goal: Ability to manage health-related needs will improve Outcome: Completed/Met   Problem: Clinical Measurements: Goal: Ability to maintain clinical measurements within normal limits will improve Outcome: Completed/Met Goal: Will remain free from infection Outcome: Completed/Met Goal: Diagnostic test results will improve Outcome: Completed/Met Goal: Respiratory complications will improve Outcome: Completed/Met Goal: Cardiovascular complication will be avoided Outcome: Completed/Met   Problem: Health Behavior/Discharge Planning: Goal: Ability to manage tracheostomy will improve Outcome: Completed/Met   Problem: Clinical Measurements: Goal: Signs and symptoms of infection will decrease Outcome: Completed/Met   Problem: Education: Goal: Knowledge about tracheostomy care/management will improve Outcome: Completed/Met   Problem: Activity: Goal: Ability to tolerate increased activity will improve Outcome: Completed/Met   Problem: Health Behavior/Discharge Planning: Goal: Ability to manage tracheostomy will improve Outcome: Completed/Met   Problem: Role Relationship: Goal: Ability to communicate will improve Outcome: Completed/Met

## 2018-11-08 NOTE — TOC Initial Note (Signed)
Transition of Care [facility]) - Initial/Assessment Note    Patient Details  Name: Logan Price MRN: [id number] Date of Birth: 1945/04/12  Transition of Care [facility] CM/SW Contact:    Geni Bers, RN Phone Number: 11/08/2018, 10:47 AM  Clinical Narrative:                 Transportation called for 12 noon with [facility]. RN and pt are aware.   Expected Discharge Plan: Home w Home Health Services Barriers to Discharge: Continued Medical Work up   Patient Goals and CMS Choice Patient states their goals for this hospitalization and ongoing recovery are:: to return home CMS Medicare.gov Compare Post Acute Care list provided to:: Other (Comment Required)(spouse) Choice offered to / list presented to : Patient, Spouse  Expected Discharge Plan and Services Expected Discharge Plan: Home w Home Health Services   Discharge Planning Services: CM Consult Post Acute Care Choice: Home Health   Expected Discharge Date: 11/08/18               DME Arranged: Tube feeding, Tube feeding pump, Trach supplies DME Agency: [facility] Date DME Agency Contacted: 11/07/18 Time DME Agency Contacted: 1438 Representative spoke with at DME Agency: Fayrene Fearing HH Arranged: PT, RN, OT, Nurse's Aide HH Agency: [facility] Date HH Agency Contacted: 11/07/18 Time HH Agency Contacted: 1439 Representative spoke with at [facility] Agency: [facility]  Prior Living Arrangements/Services   Lives with:: Spouse Patient language and need for interpreter reviewed:: Yes Do you feel safe going back to the place where you live?: Yes      Need for Family Participation in Patient Care: Yes (Comment) Care giver support system in place?: Yes (comment)   Criminal Activity/Legal Involvement Pertinent to Current Situation/Hospitalization: No - Comment as needed  Activities of Daily Living Home Assistive Devices/Equipment: None ADL Screening (condition at time of admission) Patient's cognitive ability adequate  to safely complete daily activities?: Yes Is the patient deaf or have difficulty hearing?: No Does the patient have difficulty seeing, even when wearing glasses/contacts?: No Does the patient have difficulty concentrating, remembering, or making decisions?: No Patient able to express need for assistance with ADLs?: No Does the patient have difficulty dressing or bathing?: No Independently performs ADLs?: Yes (appropriate for developmental age) Does the patient have difficulty walking or climbing stairs?: No Weakness of Legs: None Weakness of Arms/Hands: None  Permission Sought/Granted Permission sought to share information with : Family Supports Permission granted to share information with : Yes, Verbal Permission Granted        Permission granted to share info w Relationship: Spouse and Brother     Emotional Assessment Appearance:: Appears stated age   Affect (typically observed): Calm Orientation: : Oriented to Self, Oriented to Place, Oriented to  Time, Oriented to Situation Alcohol / Substance Use: Not Applicable Psych Involvement: No (comment)  Admission diagnosis:  Laryngeal Mass Patient Active Problem List   Diagnosis Date Noted  . Pleural effusion   . Shortness of breath   . 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HCC)   . Bacteremia   . Leukocytosis   . Normocytic anemia   . Protein-calorie malnutrition, severe 10/08/2018  . PSVT (paroxysmal supraventricular tachycardia) (HCC) 10/03/2018  . Atrial fibrillation (HCC) 10/03/2018  . Goals of care, counseling/discussion   . Dysphagia   . Loss of weight   . 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 Gastritis determined by endoscopy   . Esophageal dysphagia   . False positive serological test  for hepatitis C 12/13/2016  . Globus sensation 12/12/2016  . Smokes with greater than 40 pack year history 10/20/2016  . Hemorrhoids, internal, with bleeding 10/06/2015  . History of adenomatous polyp of colon 04/14/2015  . Vitamin D  deficiency 10/09/2014  . HLD (hyperlipidemia) 10/08/2012  . GERD (gastroesophageal reflux disease) 10/08/2012  . HTN (hypertension)   . Diverticulitis 10/25/2009   PCP:  [name], FNP Pharmacy:   [phone] [place] [place] - [address] [place] [address] Phone: [phone] Fax: [phone]  [facility] [place], [place] - [address] AT Portal to Registered [employer] Sites [address] [place] [address] Phone: [phone] Fax: [phone]     Social Determinants of Health (SDOH) Interventions    Readmission Risk Interventions Readmission Risk Prevention Plan 10/28/2018  Transportation Screening Complete  PCP or Specialist Appt within 3-5 Days Complete  HRI or Home Care Consult Not Complete  HRI or Home Care Consult comments dc to [facility]  Social Work Consult for Recovery Care Planning/Counseling Complete  Palliative Care Screening Not Applicable  Medication Review Oceanographer) Complete  Some recent data might be hidden

## 2018-11-08 NOTE — Progress Notes (Signed)
[facility] Therapy Treatment Record Phone [phone]   Radiation Therapy was administered to Logan Price on: 11/08/2018  10:21 AM and was treatment # 14 out of a planned course of 35 treatments.  Radiation Treatment  1). Beam photons with 6-10 energy  2). Brachytherapy None  3). Stereotactic Radiosurgery None  4). Other Radiation None     Hollynn Garno F [name], RT (T)

## 2018-11-08 NOTE — Care Management (Cosign Needed)
     Durable Medical Equipment  (From admission, onward)         Start     Ordered   11/08/18 0908  For home use only DME Hospital bed  Once    Question Answer Comment  Length of Need 12 Months   Patient has (list medical condition): Dysphagia, PEG tube, Carcinomad of the glottis, tracheostomy, trach collar,   The above medical condition requires: Patient requires the ability to reposition frequently   Head must be elevated greater than: 45 degrees   Bed type Semi-electric   Support Surface: Gel Overlay      11/08/18 0923   11/07/18 1243  For home use only DME 3 n 1  Once     11/07/18 1242   11/07/18 0853  For home use only DME Tube feeding  Once    Comments: -[employer] Peptide 1.5 @ 60 ml/hr (4.5 cans/day via pump) with 30 ml prostat (or equivalent) BID and 200 ml free water every 4 hours. -This regimen provides 2418 kcal, 131 grams protein, 13 grams of fiber, and 1200 ml free water from flushes.     11/07/18 0853   11/05/18 1439  For home use only DME Tube feeding pump  Once    Question:  Length of Need  Answer:  Lifetime   11/05/18 1441   11/05/18 1439  For home use only DME Suction  Once    Question:  Suction  Answer:  Trach   11/05/18 1441   11/05/18 1439  For home use only DME oxygen  Once    Comments: Moisture/humidifer, trach collar  Question Answer Comment  Length of Need Lifetime   Liters per Minute 2   Frequency Continuous (stationary and portable oxygen unit needed)   Oxygen delivery system Gas      11/05/18 1441   09/30/18 1030  For home use only DME Trach supplies  Once     09/30/18 1029

## 2018-11-11 ENCOUNTER — Other Ambulatory Visit: Payer: Self-pay | Admitting: Radiation Oncology

## 2018-11-11 ENCOUNTER — Other Ambulatory Visit: Payer: Self-pay

## 2018-11-11 ENCOUNTER — Ambulatory Visit
Admission: RE | Admit: 2018-11-11 | Discharge: 2018-11-11 | Disposition: A | Discharge status: Home / Self Care | Payer: Medicare Other | Source: Ambulatory Visit | Attending: Radiation Oncology | Admitting: Radiation Oncology

## 2018-11-11 DIAGNOSIS — C320 Malignant neoplasm of glottis: Secondary | ICD-10-CM

## 2018-11-11 DIAGNOSIS — Z510 Encounter for antineoplastic radiation therapy: Secondary | ICD-10-CM | POA: Diagnosis not present

## 2018-11-11 MED ORDER — SONAFINE EX EMUL
1.0000 "application " | Freq: Once | CUTANEOUS | Status: AC
  Administered 2018-11-11: 1 via TOPICAL

## 2018-11-12 ENCOUNTER — Other Ambulatory Visit: Payer: Self-pay | Admitting: Family

## 2018-11-12 ENCOUNTER — Inpatient Hospital Stay: Payer: Medicare Other | Attending: Hematology

## 2018-11-12 ENCOUNTER — Other Ambulatory Visit: Payer: Self-pay

## 2018-11-12 ENCOUNTER — Ambulatory Visit
Admission: RE | Admit: 2018-11-12 | Discharge: 2018-11-12 | Disposition: A | Discharge status: Home / Self Care | Payer: Medicare Other | Source: Ambulatory Visit | Attending: Radiation Oncology | Admitting: Radiation Oncology

## 2018-11-12 DIAGNOSIS — C320 Malignant neoplasm of glottis: Secondary | ICD-10-CM | POA: Diagnosis not present

## 2018-11-12 DIAGNOSIS — Z510 Encounter for antineoplastic radiation therapy: Secondary | ICD-10-CM | POA: Diagnosis not present

## 2018-11-12 DIAGNOSIS — J309 Allergic rhinitis, unspecified: Secondary | ICD-10-CM

## 2018-11-12 NOTE — Progress Notes (Signed)
Nutrition Assessment   Reason for Assessment:   Referral from inpatient RD with new PEG.    ASSESSMENT:  73 year old male with laryngeal cancer. 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  Past medical history of diverticulitis, gastritis, HLD, HTN.  Notes from hospital admission reviewed.  Noted trach on 7/3.  PEG placed during admission and failed osmolite 1.5 formula.   Started on [employer] Peptide 1.5 unable to tolerate bolus feeding due to fullness so started on continuous.  Spoke with patient via phone this am.  Patient reports that he is tolerating D[employer] Peptide 1.5 at 60 ml/hr.  Reports that he turns the pump on at 8 am and stops it at 8 pm.  Reports that he is able to get 4 cartons in during that time frame.  Per calculations rate needs to be at 108 ml/hr to get 4 cartons in over 12 hours.  Patient reports that he is giving 1 prostat per day (pouch).  Reports that he flushes tube with water 237ml 4 times per day and gives 59ml before starting pump and after stops pump.    Patient denies nausea or vomiting, abdominal fullness, pain.  Reports "loose" not watery stool 1-2 times per day.   Reports has catheter in and urine draining in bag.  Reports it is dark in color.  Noted sacral wound from hospital notes.  Patient reports he still has it.   Nutrition Focused Physical Exam: deferred   Medications: Vit D, MVI, protonix, zocor   Labs: glucose 119 (8/7)   Anthropometrics:   Height: 69 inches Weight: 119 lb 4.3 oz on 8/14 (? Hospital discharge wt) Noted 155 lb 3.2 oz on 04/25/2018 139 lb 4.8 oz on 09/11/2018 BMI: 17   Estimated Energy Needs  Kcals: 2300-2500 calories Protein: 115-130 g Fluid: > 2.3 L   NUTRITION DIAGNOSIS: Inadequate oral intake related to cancer and cancer related treatment and recent hospital admission as evidenced by BMI 17, new feeding tube placed    INTERVENTION:  Recommend increase  prostat to 55ml BID vs daily.  Patient confirmed that he has prostat NOT promod.  Reports that he is mixing with water.  Reviewed that patient should give 65ml water flush before, mix prostat and 62ml and give via syringe and give 19ml water flush after 2 times per day.  Recommend patient increase water flush to 232ml 4 times per day.  Patient will continue with giving 23ml water with start of pump and when stopping pump.  Continue with [employer] Peptide 1.5, 4 cartons daily via pump.  RD has left message for [employer] to verify tube feeding orders. Patient reports that he has tube feeding formula, prostat and supplies.    Current tube feeding regimen will provide 2200 calories, 126 g protein, 2359ml free water.    MONITORING, EVALUATION, GOAL: Patient will tolerate tube feeding and maintain weight during treatment   Next Visit:  Wednesday, August 26 after radiation  Oluchi Pucci B. Zenia Resides, [place], [place] Registered Dietitian [phone] (pager)

## 2018-11-13 ENCOUNTER — Ambulatory Visit
Admission: RE | Admit: 2018-11-13 | Discharge: 2018-11-13 | Disposition: A | Discharge status: Home / Self Care | Payer: Medicare Other | Source: Ambulatory Visit | Attending: Radiation Oncology | Admitting: Radiation Oncology

## 2018-11-13 ENCOUNTER — Other Ambulatory Visit: Payer: Self-pay

## 2018-11-13 DIAGNOSIS — C328 Malignant neoplasm of overlapping sites of larynx: Secondary | ICD-10-CM | POA: Diagnosis not present

## 2018-11-13 DIAGNOSIS — E43 Unspecified severe protein-calorie malnutrition: Secondary | ICD-10-CM | POA: Diagnosis not present

## 2018-11-13 DIAGNOSIS — T827XXD Infection and inflammatory reaction due to other cardiac and vascular devices, implants and grafts, subsequent encounter: Secondary | ICD-10-CM | POA: Diagnosis not present

## 2018-11-13 DIAGNOSIS — R1319 Other dysphagia: Secondary | ICD-10-CM | POA: Diagnosis not present

## 2018-11-13 DIAGNOSIS — Z510 Encounter for antineoplastic radiation therapy: Secondary | ICD-10-CM | POA: Diagnosis not present

## 2018-11-13 DIAGNOSIS — C320 Malignant neoplasm of glottis: Secondary | ICD-10-CM | POA: Diagnosis not present

## 2018-11-13 DIAGNOSIS — B965 Pseudomonas (aeruginosa) (mallei) (pseudomallei) as the cause of diseases classified elsewhere: Secondary | ICD-10-CM | POA: Diagnosis not present

## 2018-11-14 ENCOUNTER — Other Ambulatory Visit: Payer: Self-pay

## 2018-11-14 ENCOUNTER — Ambulatory Visit
Admission: RE | Admit: 2018-11-14 | Discharge: 2018-11-14 | Disposition: A | Discharge status: Home / Self Care | Payer: Medicare Other | Source: Ambulatory Visit | Attending: Radiation Oncology | Admitting: Radiation Oncology

## 2018-11-14 ENCOUNTER — Ambulatory Visit (INDEPENDENT_AMBULATORY_CARE_PROVIDER_SITE_OTHER): Payer: Medicare Other | Admitting: Family

## 2018-11-14 ENCOUNTER — Encounter: Payer: Self-pay | Admitting: Family

## 2018-11-14 VITALS — BP 110/77 | HR 101 | Temp 98.0°F | Ht 69.0 in

## 2018-11-14 DIAGNOSIS — C329 Malignant neoplasm of larynx, unspecified: Secondary | ICD-10-CM

## 2018-11-14 DIAGNOSIS — Z510 Encounter for antineoplastic radiation therapy: Secondary | ICD-10-CM | POA: Diagnosis not present

## 2018-11-14 DIAGNOSIS — E43 Unspecified severe protein-calorie malnutrition: Secondary | ICD-10-CM | POA: Diagnosis not present

## 2018-11-14 DIAGNOSIS — Z978 Presence of other specified devices: Secondary | ICD-10-CM

## 2018-11-14 DIAGNOSIS — R339 Retention of urine, unspecified: Secondary | ICD-10-CM

## 2018-11-14 DIAGNOSIS — Z960 Presence of urogenital implants: Secondary | ICD-10-CM | POA: Diagnosis not present

## 2018-11-14 DIAGNOSIS — C320 Malignant neoplasm of glottis: Secondary | ICD-10-CM

## 2018-11-14 DIAGNOSIS — Z09 Encounter for follow-up examination after completed treatment for conditions other than malignant neoplasm: Secondary | ICD-10-CM | POA: Diagnosis not present

## 2018-11-14 NOTE — Progress Notes (Signed)
Subjective:    Patient ID: Logan Price, male    DOB: September 23, 1945, 73 y.o.   MRN: [id number]  Chief Complaint  Patient presents with  . Hospitalization Follow-up     HPI Pt presents to the office today for hospital follow up. 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 He had a trach placed on 07/13. He had an infected Port-A-Cath and was was removed.   He is currently getting XRT that was started on 07/28 and to continue for 7 weeks. Pt states he is tolerating that well. He is not eating or drinking anything PO. He continues with tube feedings BID through PEG.    He also had SVT while in the hospital. He has continued his Lopressor and has follow up appt with Cardiologists.   He was discharged on 11/07/18. Wife and brother are helping with trach care.   He is followed by dietitian.   He was also discharge with a foley catheter. He states he was told he would keep this for a month and then they would try to remove. He was having urinary retention. He is not currently seeing an Urologists.    Review of Systems  Constitutional: Positive for fatigue.  Respiratory: Positive for cough.   Neurological: Positive for weakness.  All other systems reviewed and are negative.      Objective:   Physical Exam Vitals signs reviewed.  Constitutional:      General: He is not in acute distress.    Appearance: He is well-developed and underweight.  HENT:     Head: Normocephalic.  Eyes:     General:        Right eye: No discharge.        Left eye: No discharge.     Pupils: Pupils are equal, round, and reactive to light.  Neck:     Musculoskeletal: Neck supple.     Thyroid: No thyromegaly.     Comments: Trach in place Cardiovascular:     Rate and Rhythm: Regular rhythm. Tachycardia present.     Heart sounds: Normal heart sounds. No murmur.  Pulmonary:     Effort: Pulmonary effort is normal. No respiratory distress.     Breath sounds: Normal breath  sounds. No wheezing.     Comments: PEG tube in place, dressing intact Abdominal:     General: Bowel sounds are normal. There is no distension.     Palpations: Abdomen is soft.     Tenderness: There is no abdominal tenderness.  Genitourinary:    Comments: Foley cathter in placed, head of penis slightly erythemas  Musculoskeletal: Normal range of motion.        General: No tenderness.  Skin:    General: Skin is warm and dry.     Findings: No erythema or rash.  Neurological:     Mental Status: He is alert and oriented to person, place, and time.     Cranial Nerves: No cranial nerve deficit.     Motor: Weakness present.     Deep Tendon Reflexes: Reflexes are normal and symmetric.  Psychiatric:        Behavior: Behavior normal.        Thought Content: Thought content normal.        Judgment: Judgment normal.       BP 110/77   Pulse (!) 101   Temp 98 F (36.7 C) (Temporal)   Ht 5\' 9"  (1.753 m)   BMI  17.61 kg/m      Assessment & Plan:  [name] comes in today with chief complaint of Hospitalization Follow-up   Diagnosis and orders addressed:  1. Hospital discharge follow-up - CMP14+EGFR - CBC with Differential/Platelet  2. Urinary retention Stat Urologists referral placed today Keep clean and dry - Ambulatory referral to Urology - CMP14+EGFR - CBC with Differential/Platelet  3. 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HCC) - CMP14+EGFR - CBC with Differential/Platelet  4. Squamous cell carcinoma of glottis (HCC) Keep follow up with Oncologists and keep XRT appt - CMP14+EGFR - CBC with Differential/Platelet  5. Protein-calorie malnutrition, severe Keep nutritionist appts and continue tube feeding - CMP14+EGFR - CBC with Differential/Platelet  6. Foley catheter in place - Ambulatory referral to Urology - CMP14+EGFR - CBC with Differential/Platelet   Labs pending Health Maintenance reviewed Diet and exercise encouraged  Follow up plan:  1 month    [name],  FNP

## 2018-11-14 NOTE — Patient Instructions (Signed)
Acute Urinary Retention, Male  Acute urinary retention is a condition in which a person is unable to pass urine. This can last for a short time or for a long time. If left untreated, it can result in kidney damage or other serious complications. What are the causes? This condition may be caused by:  Obstruction or narrowing of the tube that drains the bladder (urethra). This may be caused by surgery or problems with nearby organs, such as the prostate gland, which can press or squeeze the urethra.  Problems with the nerves in the bladder. These can be caused by diseases, such as multiple sclerosis, or by spinal cord injuries.  Certain medicines.  Tumors in the area of the pelvis, bladder, or urethra.  Diabetes.  Degenerative cognitive conditions such as delirium or dementia.  Bladder or urinary tract infection.  Constipation.  Blood in the urine (hematuria).  Injury to the bladder or urethra.  Psychological (psychogenic) conditions. Someone may hold his urine due to trauma or because he does not want to use the bathroom. What increases the risk? This condition is more likely to develop in older men. As men age, their prostate may become larger and may start pressing or squeezing on the bladder or the urethra. What are the signs or symptoms? Symptoms of this condition include:  Trouble urinating.  Pain in the lower abdomen. Symptoms usually come on slowly over a long period of time. How is this diagnosed? This condition is diagnosed based on a physical exam and a medical history. You may also have other tests, including:  An ultrasound of the bladder or kidneys or both.  Blood tests.  A urine analysis.  Additional tests may be needed such as an MRI, kidney, or bladder function tests. How is this treated? Treatment for this condition may include:  Medicines.  Placing a thin, sterile tube (catheter) into the bladder to drain urine out of the body. This is called an  indwelling urinary catheter. After being inserted, the catheter is held in place with a small balloon that is filled with sterile water. Urine drains from the catheter into a collection bag outside of the body.  Behavioral therapy.  Treatment for any underlying conditions.  If needed, you may be treated in the hospital for kidney function problems or to manage other complications. Follow these instructions at home:  Take over-the-counter and prescription medicines only as told by your health care provider. Avoid certain medicines, such as decongestants, antihistamines, and some prescription medicines. Do not take any medicine unless your health care provider has approved.  If you were given an indwelling urinary catheter, take care of it as told by your health care provider.  Drink enough fluid to keep your urine clear or pale yellow.  If you were prescribed an antibiotic, take it as told by your health care provider. Do not stop taking the antibiotic even if you start to feel better.  Do not use any products that contain nicotine or tobacco, such as cigarettes and e-cigarettes. If you need help quitting, ask your health care provider.  Monitor any changes in your symptoms. Tell your health care provider about any changes.  If instructed, monitor your blood pressure at home. Report changes as told by your health care provider.  Keep all follow-up visits as told by your health care provider. This is important. Contact a health care provider if:  You have uncomfortable bladder contractions that you cannot control (spasms) or you leak urine with the spasms.  Get help right away if:  You have chills or fever.  You have blood in your urine.  You have a catheter and: ? Your catheter stops draining urine. ? Your catheter falls out. Summary  Acute urinary retention is a condition in which a person is unable to pass urine. If left untreated, it can result in kidney damage or other serious  complications.  The cause of this condition may include an enlarged prostate. As men age, their prostate gland may become larger and may start pressing or squeezing on the bladder or the urethra.  Treatment for this condition may include medicines and placement of an indwelling urinary catheter.  Monitor any changes in your symptoms. Tell your health care provider about any changes. This information is not intended to replace advice given to you by your health care provider. Make sure you discuss any questions you have with your health care provider. Document Released: 06/19/2000 Document Revised: 02/23/2017 Document Reviewed: 04/14/2016 Elsevier Patient Education  2020 [employer].

## 2018-11-15 ENCOUNTER — Ambulatory Visit
Admission: RE | Admit: 2018-11-15 | Discharge: 2018-11-15 | Disposition: A | Discharge status: Home / Self Care | Payer: Medicare Other | Source: Ambulatory Visit | Attending: Radiation Oncology | Admitting: Radiation Oncology

## 2018-11-15 ENCOUNTER — Other Ambulatory Visit: Payer: Self-pay

## 2018-11-15 DIAGNOSIS — T827XXD Infection and inflammatory reaction due to other cardiac and vascular devices, implants and grafts, subsequent encounter: Secondary | ICD-10-CM | POA: Diagnosis not present

## 2018-11-15 DIAGNOSIS — R1319 Other dysphagia: Secondary | ICD-10-CM | POA: Diagnosis not present

## 2018-11-15 DIAGNOSIS — C320 Malignant neoplasm of glottis: Secondary | ICD-10-CM | POA: Diagnosis not present

## 2018-11-15 DIAGNOSIS — B965 Pseudomonas (aeruginosa) (mallei) (pseudomallei) as the cause of diseases classified elsewhere: Secondary | ICD-10-CM | POA: Diagnosis not present

## 2018-11-15 DIAGNOSIS — C328 Malignant neoplasm of overlapping sites of larynx: Secondary | ICD-10-CM | POA: Diagnosis not present

## 2018-11-15 DIAGNOSIS — Z510 Encounter for antineoplastic radiation therapy: Secondary | ICD-10-CM | POA: Diagnosis not present

## 2018-11-15 DIAGNOSIS — E43 Unspecified severe protein-calorie malnutrition: Secondary | ICD-10-CM | POA: Diagnosis not present

## 2018-11-15 LAB — CMP14+EGFR
ALT: 17 IU/L (ref 0–44)
AST: 19 IU/L (ref 0–40)
Albumin/Globulin Ratio: 1.3 (ref 1.2–2.2)
Albumin: 4.1 g/dL (ref 3.7–4.7)
Alkaline Phosphatase: 76 IU/L (ref 39–117)
BUN/Creatinine Ratio: 36 — ABNORMAL HIGH (ref 10–24)
BUN: 44 mg/dL — ABNORMAL HIGH (ref 8–27)
Bilirubin Total: 0.2 mg/dL (ref 0.0–1.2)
CO2: 25 mmol/L (ref 20–29)
Calcium: 9.4 mg/dL (ref 8.6–10.2)
Chloride: 96 mmol/L (ref 96–106)
Creatinine, Ser: 1.22 mg/dL (ref 0.76–1.27)
GFR calc Af Amer: 69 mL/min/{1.73_m2} (ref >59)
GFR calc non Af Amer: 59 mL/min/{1.73_m2} — ABNORMAL LOW (ref >59)
Globulin, Total: 3.2 g/dL (ref 1.5–4.5)
Glucose: 109 mg/dL — ABNORMAL HIGH (ref 65–99)
Potassium: 5.2 mmol/L (ref 3.5–5.2)
Sodium: 141 mmol/L (ref 134–144)
Total Protein: 7.3 g/dL (ref 6.0–8.5)

## 2018-11-15 LAB — CBC WITH DIFFERENTIAL/PLATELET
Basophils Absolute: 0.1 10*3/uL (ref 0.0–0.2)
Basos: 1 %
EOS (ABSOLUTE): 0.1 10*3/uL (ref 0.0–0.4)
Eos: 1 %
Hematocrit: 38.6 % (ref 37.5–51.0)
Hemoglobin: 12.6 g/dL — ABNORMAL LOW (ref 13.0–17.7)
Immature Grans (Abs): 0.1 10*3/uL (ref 0.0–0.1)
Immature Granulocytes: 1 %
Lymphocytes Absolute: 0.7 10*3/uL (ref 0.7–3.1)
Lymphs: 7 %
MCH: 28 pg (ref 26.6–33.0)
MCHC: 32.6 g/dL (ref 31.5–35.7)
MCV: 86 fL (ref 79–97)
Monocytes Absolute: 0.9 10*3/uL (ref 0.1–0.9)
Monocytes: 9 %
Neutrophils Absolute: 8.7 10*3/uL — ABNORMAL HIGH (ref 1.4–7.0)
Neutrophils: 81 %
Platelets: 408 10*3/uL (ref 150–450)
RBC: 4.5 x10E6/uL (ref 4.14–5.80)
RDW: 13.1 % (ref 11.6–15.4)
WBC: 10.6 10*3/uL (ref 3.4–10.8)

## 2018-11-18 ENCOUNTER — Other Ambulatory Visit: Payer: Self-pay

## 2018-11-18 ENCOUNTER — Ambulatory Visit
Admission: RE | Admit: 2018-11-18 | Discharge: 2018-11-18 | Disposition: A | Discharge status: Home / Self Care | Payer: Medicare Other | Source: Ambulatory Visit | Attending: Radiation Oncology | Admitting: Radiation Oncology

## 2018-11-18 ENCOUNTER — Telehealth: Payer: Self-pay | Admitting: *Deleted

## 2018-11-18 DIAGNOSIS — Z510 Encounter for antineoplastic radiation therapy: Secondary | ICD-10-CM | POA: Diagnosis not present

## 2018-11-18 DIAGNOSIS — C320 Malignant neoplasm of glottis: Secondary | ICD-10-CM | POA: Diagnosis not present

## 2018-11-18 DIAGNOSIS — R1319 Other dysphagia: Secondary | ICD-10-CM | POA: Diagnosis not present

## 2018-11-18 DIAGNOSIS — T827XXD Infection and inflammatory reaction due to other cardiac and vascular devices, implants and grafts, subsequent encounter: Secondary | ICD-10-CM | POA: Diagnosis not present

## 2018-11-18 DIAGNOSIS — E43 Unspecified severe protein-calorie malnutrition: Secondary | ICD-10-CM | POA: Diagnosis not present

## 2018-11-18 DIAGNOSIS — B965 Pseudomonas (aeruginosa) (mallei) (pseudomallei) as the cause of diseases classified elsewhere: Secondary | ICD-10-CM | POA: Diagnosis not present

## 2018-11-18 DIAGNOSIS — C328 Malignant neoplasm of overlapping sites of larynx: Secondary | ICD-10-CM | POA: Diagnosis not present

## 2018-11-18 NOTE — Telephone Encounter (Signed)
VM from [place] w/ [facility] Pt was Rxd scopolamine - this needs a PA  Is to be on Metoprolol Tartrate 25 mg/ 10 ml  20 ml via feeding tube, BP was 102/60 & HR was 99 Please advise if pt should continue Rx, will need Rx to [facility]

## 2018-11-19 ENCOUNTER — Telehealth: Payer: Self-pay | Admitting: *Deleted

## 2018-11-19 ENCOUNTER — Other Ambulatory Visit: Payer: Self-pay

## 2018-11-19 ENCOUNTER — Ambulatory Visit
Admission: RE | Admit: 2018-11-19 | Discharge: 2018-11-19 | Disposition: A | Discharge status: Home / Self Care | Payer: Medicare Other | Source: Ambulatory Visit | Attending: Radiation Oncology | Admitting: Radiation Oncology

## 2018-11-19 ENCOUNTER — Other Ambulatory Visit: Payer: Self-pay | Admitting: Radiation Oncology

## 2018-11-19 ENCOUNTER — Ambulatory Visit (INDEPENDENT_AMBULATORY_CARE_PROVIDER_SITE_OTHER): Payer: Medicare Other

## 2018-11-19 DIAGNOSIS — J90 Pleural effusion, not elsewhere classified: Secondary | ICD-10-CM

## 2018-11-19 DIAGNOSIS — I700 Atherosclerosis of aorta: Secondary | ICD-10-CM

## 2018-11-19 DIAGNOSIS — B965 Pseudomonas (aeruginosa) (mallei) (pseudomallei) as the cause of diseases classified elsewhere: Secondary | ICD-10-CM | POA: Diagnosis not present

## 2018-11-19 DIAGNOSIS — R2681 Unsteadiness on feet: Secondary | ICD-10-CM

## 2018-11-19 DIAGNOSIS — C328 Malignant neoplasm of overlapping sites of larynx: Secondary | ICD-10-CM | POA: Diagnosis not present

## 2018-11-19 DIAGNOSIS — Z431 Encounter for attention to gastrostomy: Secondary | ICD-10-CM

## 2018-11-19 DIAGNOSIS — Z9981 Dependence on supplemental oxygen: Secondary | ICD-10-CM

## 2018-11-19 DIAGNOSIS — C320 Malignant neoplasm of glottis: Secondary | ICD-10-CM

## 2018-11-19 DIAGNOSIS — Z510 Encounter for antineoplastic radiation therapy: Secondary | ICD-10-CM | POA: Diagnosis not present

## 2018-11-19 DIAGNOSIS — I10 Essential (primary) hypertension: Secondary | ICD-10-CM

## 2018-11-19 DIAGNOSIS — M6281 Muscle weakness (generalized): Secondary | ICD-10-CM

## 2018-11-19 DIAGNOSIS — R339 Retention of urine, unspecified: Secondary | ICD-10-CM

## 2018-11-19 DIAGNOSIS — J189 Pneumonia, unspecified organism: Secondary | ICD-10-CM | POA: Diagnosis not present

## 2018-11-19 DIAGNOSIS — Z466 Encounter for fitting and adjustment of urinary device: Secondary | ICD-10-CM

## 2018-11-19 DIAGNOSIS — R1319 Other dysphagia: Secondary | ICD-10-CM

## 2018-11-19 DIAGNOSIS — E43 Unspecified severe protein-calorie malnutrition: Secondary | ICD-10-CM | POA: Diagnosis not present

## 2018-11-19 DIAGNOSIS — T827XXD Infection and inflammatory reaction due to other cardiac and vascular devices, implants and grafts, subsequent encounter: Secondary | ICD-10-CM | POA: Diagnosis not present

## 2018-11-19 DIAGNOSIS — Z430 Encounter for attention to tracheostomy: Secondary | ICD-10-CM

## 2018-11-19 MED ORDER — SCOPOLAMINE 1 MG/3DAYS TD PT72: 1.0000 | MEDICATED_PATCH | TRANSDERMAL | 12 refills | Status: DC

## 2018-11-19 NOTE — Telephone Encounter (Signed)
Logan Price is aware.

## 2018-11-19 NOTE — Addendum Note (Signed)
Addended by: Evelina Dun A on: 11/19/2018 02:20 PM   Modules accepted: Orders

## 2018-11-19 NOTE — Telephone Encounter (Signed)
Called patient to inform of appt. with Logan Price on 11-21-18 - arrival time- 11:30 am - address - [address], [place], ph. No. - [phone], spoke with patient and he  Is aware of this appt.

## 2018-11-19 NOTE — Telephone Encounter (Signed)
Hold metoprolol at this time.   I have reordered scopolamine patches.

## 2018-11-19 NOTE — Telephone Encounter (Signed)
I haven't received a PA on him and we didn't rx the scopalamine. When I called [facility] where the rx was sent they said they didn't send for a PA.

## 2018-11-19 NOTE — Telephone Encounter (Signed)
LMOVM to [facility] w/ [facility] Hold Metoprolol & scopolamine refilled

## 2018-11-20 ENCOUNTER — Ambulatory Visit
Admission: RE | Admit: 2018-11-20 | Discharge: 2018-11-20 | Disposition: A | Discharge status: Home / Self Care | Payer: Medicare Other | Source: Ambulatory Visit | Attending: Radiation Oncology | Admitting: Radiation Oncology

## 2018-11-20 ENCOUNTER — Inpatient Hospital Stay: Payer: Medicare Other | Admitting: Nutrition

## 2018-11-20 ENCOUNTER — Telehealth: Payer: Self-pay | Admitting: *Deleted

## 2018-11-20 ENCOUNTER — Other Ambulatory Visit: Payer: Self-pay

## 2018-11-20 DIAGNOSIS — C328 Malignant neoplasm of overlapping sites of larynx: Secondary | ICD-10-CM | POA: Diagnosis not present

## 2018-11-20 DIAGNOSIS — E43 Unspecified severe protein-calorie malnutrition: Secondary | ICD-10-CM | POA: Diagnosis not present

## 2018-11-20 DIAGNOSIS — R1319 Other dysphagia: Secondary | ICD-10-CM | POA: Diagnosis not present

## 2018-11-20 DIAGNOSIS — C320 Malignant neoplasm of glottis: Secondary | ICD-10-CM | POA: Diagnosis not present

## 2018-11-20 DIAGNOSIS — Z510 Encounter for antineoplastic radiation therapy: Secondary | ICD-10-CM | POA: Diagnosis not present

## 2018-11-20 DIAGNOSIS — T827XXD Infection and inflammatory reaction due to other cardiac and vascular devices, implants and grafts, subsequent encounter: Secondary | ICD-10-CM | POA: Diagnosis not present

## 2018-11-20 DIAGNOSIS — B965 Pseudomonas (aeruginosa) (mallei) (pseudomallei) as the cause of diseases classified elsewhere: Secondary | ICD-10-CM | POA: Diagnosis not present

## 2018-11-20 NOTE — Telephone Encounter (Addendum)
Prior Auth for Scopolamine 1mg /3days 72 hr patch-APPROVED from 08/22/18 through 03/27/19  Key: [id number]   PA Case ID: [id number]    Your information has been submitted to [place] Medicare Part D. [employer] Part D will review the request and will issue a decision, typically within 1-3 days from your submission. You can check the updated outcome later by reopening this request.  If [employer] has not responded in 1-3 days or if you have any questions about your ePA request, please contact [place] Medicare Part D at [phone]. If you think there may be a problem with your PA request, use our live chat feature at the bottom right.

## 2018-11-21 ENCOUNTER — Other Ambulatory Visit: Payer: Self-pay

## 2018-11-21 ENCOUNTER — Ambulatory Visit: Payer: Medicare Other | Attending: Radiation Oncology

## 2018-11-21 ENCOUNTER — Ambulatory Visit
Admission: RE | Admit: 2018-11-21 | Discharge: 2018-11-21 | Disposition: A | Discharge status: Home / Self Care | Payer: Medicare Other | Source: Ambulatory Visit | Attending: Radiation Oncology | Admitting: Radiation Oncology

## 2018-11-21 DIAGNOSIS — Z510 Encounter for antineoplastic radiation therapy: Secondary | ICD-10-CM | POA: Diagnosis not present

## 2018-11-21 DIAGNOSIS — R1313 Dysphagia, pharyngeal phase: Secondary | ICD-10-CM | POA: Diagnosis not present

## 2018-11-21 DIAGNOSIS — C320 Malignant neoplasm of glottis: Secondary | ICD-10-CM | POA: Diagnosis not present

## 2018-11-21 NOTE — Patient Instructions (Addendum)
   SWALLOWING EXERCISES Do these daily until 6 months after your last radiation day, then 2 times per week afterwards  1. Effortful Swallows - Press your tongue against the roof of your mouth for 3 seconds, then squeeze the muscles in your neck while you swallow your saliva or a sip of water - Repeat 10-15 times, 2-3 times a day, and use whenever you eat or drink  2. Pitch Raise - Repeat "he", once per second in as high of a pitch as you can - Repeat 20 times, 2-3 times a day  3. Mendelsohn Maneuver - "half swallow" exercise - Swallow as hard as you can as long as you can for at least 5 secondst - Repeat 10-15 times, 2-3 times a day *use a wet spoon if your mouth gets dry*  4. Chin pushback - Open your mouth  - Place your fist UNDER your chin near your neck, and push back with your fist for 5 seconds - Repeat 10 times, 2-3 times a day   ==============================================  ICE CHIPS  You may have ice chips - from 2-3 tablespoons to a 1/4 cup up to twice a day.  ** Please brush your teeth, tongue, and use mouthwash (if you like - leaning over the sink) up to 30 minutes prior to taking the chips - if you do good oral care you have to eat the chips within 30 minutes, or you have to do oral care again  Swallow as hard as you can with the chips - try to swallow them down a few times before using your suction machine to clear

## 2018-11-21 NOTE — Therapy (Signed)
tachycardia) ([place]) 10/03/2018  . Atrial fibrillation ([place]) 10/03/2018  . Goals of care, counseling/discussion   . Dysphagia   . Loss of weight   . 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 Gastritis determined by endoscopy   . Esophageal dysphagia   . False positive serological test for  hepatitis C 12/13/2016  . Globus sensation 12/12/2016  . Smokes with greater than 40 pack year history 10/20/2016  . Hemorrhoids, internal, with bleeding 10/06/2015  . History of adenomatous polyp of colon 04/14/2015  . Vitamin D deficiency 10/09/2014  . HLD (hyperlipidemia) 10/08/2012  . GERD (gastroesophageal reflux disease) 10/08/2012  . HTN (hypertension)   . Diverticulitis 10/25/2009    [facility] ,[place], [place]  11/21/2018, 1:55 PM  [place] [address] [place], [name], [address] Phone: [phone]   Fax:  [phone]  Name: Logan Price MRN: [id number] Date of Birth: 1945-04-07  [place] [address] [place], [name], [address] Phone: [phone]   Fax:  [phone]  Speech Language Pathology Evaluation  Patient Details  Name: Logan Price MRN: [id number] Date of Birth: 1945/05/03 Referring Provider (SLP): Eppie Gibson, MD   Encounter Date: 11/21/2018  End of Session - 11/21/18 1322    Visit Number  1    Number of Visits  7    Date for SLP Re-Evaluation  02/19/19   9 0days   SLP Start Time  1106    SLP Stop Time   1146    SLP Time Calculation (min)  40 min    Activity Tolerance  Patient tolerated treatment well       Past Medical History:  Diagnosis Date  . Diverticulitis   . 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 GERD (gastroesophageal reflux disease)   . Hepatitis C   . HTN (hypertension)   . Hyperlipidemia     Past Surgical History:  Procedure Laterality Date  . BIOPSY  05/04/2015   Procedure: BIOPSY;  Surgeon: Danie Binder, MD;  [facility];  Service: Endoscopy;;  bx's of ileocecal valve   . COLONOSCOPY WITH PROPOFOL N/A 05/04/2015   Dr. Oneida Alar: normal appearing ileum with prominent IC valve with tubular adenomas, moderate diverticulosis in sigmoid colon, ascending colon, and retum. Moderate sized internal hemorrhoids. Surveillance in 5 years  . ESOPHAGOGASTRODUODENOSCOPY (EGD) WITH PROPOFOL N/A 12/19/2016   Procedure: ESOPHAGOGASTRODUODENOSCOPY (EGD) WITH PROPOFOL;  Surgeon: Danie Binder, MD;  [facility];  Service: Endoscopy;  Laterality: N/A;  11:30am  . FLEXIBLE SIGMOIDOSCOPY N/A 12/10/2015   hemorrhoid banding X 3   . HEMORRHOID BANDING N/A 12/10/2015   Procedure: HEMORRHOID BANDING;  Surgeon: Danie Binder, MD;  [facility];  Service: Endoscopy;  Laterality: N/A;  1:30 PM  . IR CM INJ ANY COLONIC TUBE W/FLUORO  10/30/2018  . IR GASTROSTOMY TUBE MOD SED  10/04/2018  . IR IMAGING GUIDED PORT INSERTION  10/04/2018   . IR REMOVAL TUN ACCESS W/ PORT W/O FL MOD SED  10/14/2018  . IR REPLACE G-TUBE SIMPLE WO FLUORO  11/03/2018  . LAPAROSCOPIC INSERTION GASTROSTOMY TUBE Left 10/07/2018   Procedure: LAPAROSCOPIC  GASTROSTOMY TUBE;  Surgeon: Kinsinger, Arta Bruce, MD;  Location: [facility];  Service: General;  Laterality: Left;  Marland Kitchen MICROLARYNGOSCOPY N/A 09/27/2018   Procedure: MICRO DIRECT LARYNGOSCOPY WITH BIOPSY;  Surgeon: Leta Baptist, MD;  [facility];  Service: ENT;  Laterality: N/A;  . None to date     As of 04/14/15  . POLYPECTOMY  05/04/2015   Procedure: POLYPECTOMY;  Surgeon: Danie Binder, MD;  [facility];  Service: Endoscopy;;  descending colon polyp, ascending colon polyp  . SAVORY DILATION N/A 12/19/2016   Procedure: SAVORY DILATION;  Surgeon: Danie Binder, MD;  [facility];  Service: Endoscopy;  Laterality: N/A;  . TRACHEOSTOMY TUBE PLACEMENT N/A 09/27/2018   Procedure: AWAKE TRACHEOSTOMY;  Surgeon: Leta Baptist, MD;  Location: [facility] OR;  Service: ENT;  Laterality: N/A;    There were no vitals filed for this visit.  Subjective Assessment - 11/21/18 1313    Subjective  Pt arrives with trach/PMSV and brother, [name].    Currently in Pain?  No/denies         SLP Evaluation [facility] - 11/21/18 1313      SLP Visit Information   SLP Received On  11/21/18  tachycardia) ([place]) 10/03/2018  . Atrial fibrillation ([place]) 10/03/2018  . Goals of care, counseling/discussion   . Dysphagia   . Loss of weight   . 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 Gastritis determined by endoscopy   . Esophageal dysphagia   . False positive serological test for  hepatitis C 12/13/2016  . Globus sensation 12/12/2016  . Smokes with greater than 40 pack year history 10/20/2016  . Hemorrhoids, internal, with bleeding 10/06/2015  . History of adenomatous polyp of colon 04/14/2015  . Vitamin D deficiency 10/09/2014  . HLD (hyperlipidemia) 10/08/2012  . GERD (gastroesophageal reflux disease) 10/08/2012  . HTN (hypertension)   . Diverticulitis 10/25/2009    [facility] ,[place]  11/21/2018, 1:55 PM  [place] [address] [place], [name], [address] Phone: [phone]   Fax:  [phone]  Name: Logan Price MRN: [id number] Date of Birth: 1945-04-07  tachycardia) ([place]) 10/03/2018  . Atrial fibrillation ([place]) 10/03/2018  . Goals of care, counseling/discussion   . Dysphagia   . Loss of weight   . 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 Gastritis determined by endoscopy   . Esophageal dysphagia   . False positive serological test for  hepatitis C 12/13/2016  . Globus sensation 12/12/2016  . Smokes with greater than 40 pack year history 10/20/2016  . Hemorrhoids, internal, with bleeding 10/06/2015  . History of adenomatous polyp of colon 04/14/2015  . Vitamin D deficiency 10/09/2014  . HLD (hyperlipidemia) 10/08/2012  . GERD (gastroesophageal reflux disease) 10/08/2012  . HTN (hypertension)   . Diverticulitis 10/25/2009    [facility] ,[place]  11/21/2018, 1:55 PM  [place] [address] [place], [name], [address] Phone: [phone]   Fax:  [phone]  Name: Logan Price MRN: [id number] Date of Birth: 1945-04-07

## 2018-11-22 ENCOUNTER — Other Ambulatory Visit: Payer: Self-pay

## 2018-11-22 ENCOUNTER — Ambulatory Visit
Admission: RE | Admit: 2018-11-22 | Discharge: 2018-11-22 | Disposition: A | Discharge status: Home / Self Care | Payer: Medicare Other | Source: Ambulatory Visit | Attending: Radiation Oncology | Admitting: Radiation Oncology

## 2018-11-22 DIAGNOSIS — C320 Malignant neoplasm of glottis: Secondary | ICD-10-CM | POA: Diagnosis not present

## 2018-11-22 DIAGNOSIS — Z510 Encounter for antineoplastic radiation therapy: Secondary | ICD-10-CM | POA: Diagnosis not present

## 2018-11-25 ENCOUNTER — Other Ambulatory Visit: Payer: Self-pay

## 2018-11-25 ENCOUNTER — Ambulatory Visit
Admission: RE | Admit: 2018-11-25 | Discharge: 2018-11-25 | Disposition: A | Discharge status: Home / Self Care | Payer: Medicare Other | Source: Ambulatory Visit | Attending: Radiation Oncology | Admitting: Radiation Oncology

## 2018-11-25 ENCOUNTER — Ambulatory Visit: Payer: Medicare Other | Admitting: Nutrition

## 2018-11-25 DIAGNOSIS — C320 Malignant neoplasm of glottis: Secondary | ICD-10-CM | POA: Diagnosis not present

## 2018-11-25 DIAGNOSIS — R1319 Other dysphagia: Secondary | ICD-10-CM | POA: Diagnosis not present

## 2018-11-25 DIAGNOSIS — C328 Malignant neoplasm of overlapping sites of larynx: Secondary | ICD-10-CM | POA: Diagnosis not present

## 2018-11-25 DIAGNOSIS — T827XXD Infection and inflammatory reaction due to other cardiac and vascular devices, implants and grafts, subsequent encounter: Secondary | ICD-10-CM | POA: Diagnosis not present

## 2018-11-25 DIAGNOSIS — B965 Pseudomonas (aeruginosa) (mallei) (pseudomallei) as the cause of diseases classified elsewhere: Secondary | ICD-10-CM | POA: Diagnosis not present

## 2018-11-25 DIAGNOSIS — Z510 Encounter for antineoplastic radiation therapy: Secondary | ICD-10-CM | POA: Diagnosis not present

## 2018-11-25 DIAGNOSIS — E43 Unspecified severe protein-calorie malnutrition: Secondary | ICD-10-CM | POA: Diagnosis not present

## 2018-11-25 NOTE — Progress Notes (Signed)
Nutrition follow up completed with patient over the telephone. He reports good tolerance of [employer] 1.5 peptide formula. He is using 4 cartons daily and 30 mL Prostat BID.  Denies nutrition impact symptoms. Has enough supplies and formula. Weight improved to 126.2 pounds this week, up from 125.4 pounds last week. Lab reviewed.  Nutrition Diagnosis:Inadequate oral intake continues.  Intervention: Educated to continue TF and Protein with free water as educated. TF regimen provides 2200 kcal, 126 grams pro, 2348 mL free water.  Monitoring, Evaluation, Goals: Tolerate TF for weight maintenance/gain during treatment.  Next Visit:Tuesday, Sept 8.

## 2018-11-26 ENCOUNTER — Other Ambulatory Visit: Payer: Self-pay

## 2018-11-26 ENCOUNTER — Ambulatory Visit
Admission: RE | Admit: 2018-11-26 | Discharge: 2018-11-26 | Disposition: A | Discharge status: Home / Self Care | Payer: Medicare Other | Source: Ambulatory Visit | Attending: Radiation Oncology | Admitting: Radiation Oncology

## 2018-11-26 DIAGNOSIS — Z510 Encounter for antineoplastic radiation therapy: Secondary | ICD-10-CM | POA: Insufficient documentation

## 2018-11-26 DIAGNOSIS — C320 Malignant neoplasm of glottis: Secondary | ICD-10-CM | POA: Diagnosis not present

## 2018-11-27 ENCOUNTER — Encounter: Payer: Self-pay | Admitting: Gastroenterology

## 2018-11-27 ENCOUNTER — Ambulatory Visit
Admission: RE | Admit: 2018-11-27 | Discharge: 2018-11-27 | Disposition: A | Discharge status: Home / Self Care | Payer: Medicare Other | Source: Ambulatory Visit | Attending: Radiation Oncology | Admitting: Radiation Oncology

## 2018-11-27 ENCOUNTER — Other Ambulatory Visit: Payer: Self-pay

## 2018-11-27 DIAGNOSIS — C328 Malignant neoplasm of overlapping sites of larynx: Secondary | ICD-10-CM | POA: Diagnosis not present

## 2018-11-27 DIAGNOSIS — B965 Pseudomonas (aeruginosa) (mallei) (pseudomallei) as the cause of diseases classified elsewhere: Secondary | ICD-10-CM | POA: Diagnosis not present

## 2018-11-27 DIAGNOSIS — C320 Malignant neoplasm of glottis: Secondary | ICD-10-CM | POA: Diagnosis not present

## 2018-11-27 DIAGNOSIS — T827XXD Infection and inflammatory reaction due to other cardiac and vascular devices, implants and grafts, subsequent encounter: Secondary | ICD-10-CM | POA: Diagnosis not present

## 2018-11-27 DIAGNOSIS — R1319 Other dysphagia: Secondary | ICD-10-CM | POA: Diagnosis not present

## 2018-11-27 DIAGNOSIS — Z510 Encounter for antineoplastic radiation therapy: Secondary | ICD-10-CM | POA: Diagnosis not present

## 2018-11-27 DIAGNOSIS — E43 Unspecified severe protein-calorie malnutrition: Secondary | ICD-10-CM | POA: Diagnosis not present

## 2018-11-28 ENCOUNTER — Ambulatory Visit
Admission: RE | Admit: 2018-11-28 | Discharge: 2018-11-28 | Disposition: A | Discharge status: Home / Self Care | Payer: Medicare Other | Source: Ambulatory Visit | Attending: Radiation Oncology | Admitting: Radiation Oncology

## 2018-11-28 ENCOUNTER — Other Ambulatory Visit: Payer: Self-pay

## 2018-11-28 DIAGNOSIS — Z510 Encounter for antineoplastic radiation therapy: Secondary | ICD-10-CM | POA: Diagnosis not present

## 2018-11-28 DIAGNOSIS — C320 Malignant neoplasm of glottis: Secondary | ICD-10-CM | POA: Diagnosis not present

## 2018-11-29 ENCOUNTER — Ambulatory Visit
Admission: RE | Admit: 2018-11-29 | Discharge: 2018-11-29 | Disposition: A | Discharge status: Home / Self Care | Payer: Medicare Other | Source: Ambulatory Visit | Attending: Radiation Oncology | Admitting: Radiation Oncology

## 2018-11-29 ENCOUNTER — Other Ambulatory Visit: Payer: Self-pay

## 2018-11-29 DIAGNOSIS — R1319 Other dysphagia: Secondary | ICD-10-CM | POA: Diagnosis not present

## 2018-11-29 DIAGNOSIS — C328 Malignant neoplasm of overlapping sites of larynx: Secondary | ICD-10-CM | POA: Diagnosis not present

## 2018-11-29 DIAGNOSIS — T827XXD Infection and inflammatory reaction due to other cardiac and vascular devices, implants and grafts, subsequent encounter: Secondary | ICD-10-CM | POA: Diagnosis not present

## 2018-11-29 DIAGNOSIS — B965 Pseudomonas (aeruginosa) (mallei) (pseudomallei) as the cause of diseases classified elsewhere: Secondary | ICD-10-CM | POA: Diagnosis not present

## 2018-11-29 DIAGNOSIS — C320 Malignant neoplasm of glottis: Secondary | ICD-10-CM | POA: Diagnosis not present

## 2018-11-29 DIAGNOSIS — E43 Unspecified severe protein-calorie malnutrition: Secondary | ICD-10-CM | POA: Diagnosis not present

## 2018-11-29 DIAGNOSIS — Z510 Encounter for antineoplastic radiation therapy: Secondary | ICD-10-CM | POA: Diagnosis not present

## 2018-12-03 ENCOUNTER — Other Ambulatory Visit: Payer: Self-pay

## 2018-12-03 ENCOUNTER — Inpatient Hospital Stay: Payer: Medicare Other | Attending: Hematology

## 2018-12-03 ENCOUNTER — Ambulatory Visit
Admission: RE | Admit: 2018-12-03 | Discharge: 2018-12-03 | Disposition: A | Discharge status: Home / Self Care | Payer: Medicare Other | Source: Ambulatory Visit | Attending: Radiation Oncology | Admitting: Radiation Oncology

## 2018-12-03 DIAGNOSIS — C320 Malignant neoplasm of glottis: Secondary | ICD-10-CM | POA: Diagnosis not present

## 2018-12-03 DIAGNOSIS — Z510 Encounter for antineoplastic radiation therapy: Secondary | ICD-10-CM | POA: Diagnosis not present

## 2018-12-03 NOTE — Progress Notes (Signed)
Nutrition Follow-up:  Met with patient in clinic prior to meeting with MD.    Patient reports that he is tolerating [facility] Peptide, 4 cartons per day via feeding pump.  He is continue prostat 39m BID.  Reports bowels are moving normally.  No nutrition impact symptoms.     Anthropometrics:   Weight 126 lb 8 oz today in radiation.  Stable from last week of 126 lb 2 oz.     NUTRITION DIAGNOSIS: Inadequate oral intake continues   INTERVENTION:  Patient to continue provided [employer] 1.5, 4 cartons daily via pump and 33mBID prostat and water flush of 24010m times per day.  Provided patient phone number for [employer] as patient reports he is running low on tube feeding.     MONITORING, EVALUATION, GOAL: Patient will tolerate TF for weight maintenance/gain during treatment   Next Visit: phone f/u Wednesday, Sept 16  Saisha Hogue B. AllZenia ResidesD,Fifth StreetDNLa Luzgistered Dietitian [phone])

## 2018-12-04 ENCOUNTER — Ambulatory Visit
Admission: RE | Admit: 2018-12-04 | Discharge: 2018-12-04 | Disposition: A | Discharge status: Home / Self Care | Payer: Medicare Other | Source: Ambulatory Visit | Attending: Radiation Oncology | Admitting: Radiation Oncology

## 2018-12-04 DIAGNOSIS — C320 Malignant neoplasm of glottis: Secondary | ICD-10-CM | POA: Diagnosis not present

## 2018-12-04 DIAGNOSIS — T827XXD Infection and inflammatory reaction due to other cardiac and vascular devices, implants and grafts, subsequent encounter: Secondary | ICD-10-CM | POA: Diagnosis not present

## 2018-12-04 DIAGNOSIS — B965 Pseudomonas (aeruginosa) (mallei) (pseudomallei) as the cause of diseases classified elsewhere: Secondary | ICD-10-CM | POA: Diagnosis not present

## 2018-12-04 DIAGNOSIS — E43 Unspecified severe protein-calorie malnutrition: Secondary | ICD-10-CM | POA: Diagnosis not present

## 2018-12-04 DIAGNOSIS — R1319 Other dysphagia: Secondary | ICD-10-CM | POA: Diagnosis not present

## 2018-12-04 DIAGNOSIS — Z510 Encounter for antineoplastic radiation therapy: Secondary | ICD-10-CM | POA: Diagnosis not present

## 2018-12-04 DIAGNOSIS — C328 Malignant neoplasm of overlapping sites of larynx: Secondary | ICD-10-CM | POA: Diagnosis not present

## 2018-12-05 ENCOUNTER — Other Ambulatory Visit: Payer: Self-pay

## 2018-12-05 ENCOUNTER — Encounter: Payer: Self-pay | Admitting: *Deleted

## 2018-12-05 ENCOUNTER — Ambulatory Visit
Admission: RE | Admit: 2018-12-05 | Discharge: 2018-12-05 | Disposition: A | Discharge status: Home / Self Care | Payer: Medicare Other | Source: Ambulatory Visit | Attending: Radiation Oncology | Admitting: Radiation Oncology

## 2018-12-05 ENCOUNTER — Telehealth: Payer: Self-pay | Admitting: *Deleted

## 2018-12-05 DIAGNOSIS — B965 Pseudomonas (aeruginosa) (mallei) (pseudomallei) as the cause of diseases classified elsewhere: Secondary | ICD-10-CM | POA: Diagnosis not present

## 2018-12-05 DIAGNOSIS — T827XXD Infection and inflammatory reaction due to other cardiac and vascular devices, implants and grafts, subsequent encounter: Secondary | ICD-10-CM | POA: Diagnosis not present

## 2018-12-05 DIAGNOSIS — C328 Malignant neoplasm of overlapping sites of larynx: Secondary | ICD-10-CM | POA: Diagnosis not present

## 2018-12-05 DIAGNOSIS — R1319 Other dysphagia: Secondary | ICD-10-CM | POA: Diagnosis not present

## 2018-12-05 DIAGNOSIS — C320 Malignant neoplasm of glottis: Secondary | ICD-10-CM | POA: Diagnosis not present

## 2018-12-05 DIAGNOSIS — E43 Unspecified severe protein-calorie malnutrition: Secondary | ICD-10-CM | POA: Diagnosis not present

## 2018-12-05 DIAGNOSIS — Z510 Encounter for antineoplastic radiation therapy: Secondary | ICD-10-CM | POA: Diagnosis not present

## 2018-12-05 NOTE — Telephone Encounter (Signed)
Received vm message from pt's homehealth nurse, Kathlee Nations.  She stated that pt's feeding tube sutures are coming out.  Liliane Channel, Can you call the home health nurse to advise? Thanks

## 2018-12-05 NOTE — Telephone Encounter (Signed)
"  Logan Price, [facility] [phone]).  Following Phineas Inches for a month.   Has a foley catheter in but does not know of any f/u appointments with a urologist. Sutures to feeding tube, both have broken off.  Feeding going in well. Trying to figure out who he needs to see about his catheter and who to call about sutures.  Oncologist number is the only number they gave me.  Please call to sort or help me sort."

## 2018-12-06 ENCOUNTER — Ambulatory Visit
Admission: RE | Admit: 2018-12-06 | Discharge: 2018-12-06 | Disposition: A | Discharge status: Home / Self Care | Payer: Medicare Other | Source: Ambulatory Visit | Attending: Radiation Oncology | Admitting: Radiation Oncology

## 2018-12-06 ENCOUNTER — Other Ambulatory Visit: Payer: Self-pay

## 2018-12-06 ENCOUNTER — Encounter: Payer: Self-pay | Admitting: *Deleted

## 2018-12-06 DIAGNOSIS — E43 Unspecified severe protein-calorie malnutrition: Secondary | ICD-10-CM | POA: Diagnosis not present

## 2018-12-06 DIAGNOSIS — Z510 Encounter for antineoplastic radiation therapy: Secondary | ICD-10-CM | POA: Diagnosis not present

## 2018-12-06 DIAGNOSIS — T827XXD Infection and inflammatory reaction due to other cardiac and vascular devices, implants and grafts, subsequent encounter: Secondary | ICD-10-CM | POA: Diagnosis not present

## 2018-12-06 DIAGNOSIS — C328 Malignant neoplasm of overlapping sites of larynx: Secondary | ICD-10-CM | POA: Diagnosis not present

## 2018-12-06 DIAGNOSIS — C320 Malignant neoplasm of glottis: Secondary | ICD-10-CM | POA: Diagnosis not present

## 2018-12-06 DIAGNOSIS — B965 Pseudomonas (aeruginosa) (mallei) (pseudomallei) as the cause of diseases classified elsewhere: Secondary | ICD-10-CM | POA: Diagnosis not present

## 2018-12-06 DIAGNOSIS — R1319 Other dysphagia: Secondary | ICD-10-CM | POA: Diagnosis not present

## 2018-12-06 NOTE — Telephone Encounter (Signed)
I assessed PEG yesterday when he arrived for RT, does not appear problematic.  I will give her a call later this morning.

## 2018-12-06 NOTE — Telephone Encounter (Signed)
Thank you :)

## 2018-12-07 NOTE — Progress Notes (Signed)
A user error has taken place: encounter opened in error, closed for administrative reasons.

## 2018-12-07 NOTE — Progress Notes (Signed)
Oncology Nurse Navigator Documentation  Met with Logan Price in follow-up to message forwarded from [employer] from [facility] re follow-up for Foley catheter and status of PEG. Logan Price indicated he had follow-up appt with [facility] next week re Foley. Upon assessment of PEG, 2 of 3 T-bars absent, suture attaching retention ring to 3rd bar detached.  Retention ring approx 1 cm distal to skin surface.  Site clean/dry/intact. Logan Price denied issue with functionality of PEG.  RN Anderson Malta notified, she LVMM for [name].  This navigator to follow-up with IR re PEG and to call [name].  Logan Orem, RN, BSN Head & Neck Oncology Nurse [place] at [place] [phone]

## 2018-12-08 DIAGNOSIS — E43 Unspecified severe protein-calorie malnutrition: Secondary | ICD-10-CM | POA: Diagnosis not present

## 2018-12-08 DIAGNOSIS — Z430 Encounter for attention to tracheostomy: Secondary | ICD-10-CM | POA: Diagnosis not present

## 2018-12-08 DIAGNOSIS — I700 Atherosclerosis of aorta: Secondary | ICD-10-CM | POA: Diagnosis not present

## 2018-12-08 DIAGNOSIS — T827XXD Infection and inflammatory reaction due to other cardiac and vascular devices, implants and grafts, subsequent encounter: Secondary | ICD-10-CM | POA: Diagnosis not present

## 2018-12-08 DIAGNOSIS — C328 Malignant neoplasm of overlapping sites of larynx: Secondary | ICD-10-CM | POA: Diagnosis not present

## 2018-12-08 DIAGNOSIS — R1319 Other dysphagia: Secondary | ICD-10-CM | POA: Diagnosis not present

## 2018-12-08 DIAGNOSIS — Z9981 Dependence on supplemental oxygen: Secondary | ICD-10-CM | POA: Diagnosis not present

## 2018-12-08 DIAGNOSIS — Z466 Encounter for fitting and adjustment of urinary device: Secondary | ICD-10-CM | POA: Diagnosis not present

## 2018-12-08 DIAGNOSIS — C320 Malignant neoplasm of glottis: Secondary | ICD-10-CM | POA: Diagnosis not present

## 2018-12-08 DIAGNOSIS — R339 Retention of urine, unspecified: Secondary | ICD-10-CM | POA: Diagnosis not present

## 2018-12-08 DIAGNOSIS — B965 Pseudomonas (aeruginosa) (mallei) (pseudomallei) as the cause of diseases classified elsewhere: Secondary | ICD-10-CM | POA: Diagnosis not present

## 2018-12-08 DIAGNOSIS — I10 Essential (primary) hypertension: Secondary | ICD-10-CM | POA: Diagnosis not present

## 2018-12-08 DIAGNOSIS — Z431 Encounter for attention to gastrostomy: Secondary | ICD-10-CM | POA: Diagnosis not present

## 2018-12-08 DIAGNOSIS — M6281 Muscle weakness (generalized): Secondary | ICD-10-CM | POA: Diagnosis not present

## 2018-12-08 DIAGNOSIS — R2681 Unsteadiness on feet: Secondary | ICD-10-CM | POA: Diagnosis not present

## 2018-12-08 DIAGNOSIS — J90 Pleural effusion, not elsewhere classified: Secondary | ICD-10-CM | POA: Diagnosis not present

## 2018-12-08 DIAGNOSIS — J189 Pneumonia, unspecified organism: Secondary | ICD-10-CM | POA: Diagnosis not present

## 2018-12-09 ENCOUNTER — Ambulatory Visit
Admission: RE | Admit: 2018-12-09 | Discharge: 2018-12-09 | Disposition: A | Discharge status: Home / Self Care | Payer: Medicare Other | Source: Ambulatory Visit | Attending: Radiation Oncology | Admitting: Radiation Oncology

## 2018-12-09 DIAGNOSIS — B965 Pseudomonas (aeruginosa) (mallei) (pseudomallei) as the cause of diseases classified elsewhere: Secondary | ICD-10-CM | POA: Diagnosis not present

## 2018-12-09 DIAGNOSIS — C328 Malignant neoplasm of overlapping sites of larynx: Secondary | ICD-10-CM | POA: Diagnosis not present

## 2018-12-09 DIAGNOSIS — Z510 Encounter for antineoplastic radiation therapy: Secondary | ICD-10-CM | POA: Diagnosis not present

## 2018-12-09 DIAGNOSIS — C320 Malignant neoplasm of glottis: Secondary | ICD-10-CM | POA: Diagnosis not present

## 2018-12-09 DIAGNOSIS — R1319 Other dysphagia: Secondary | ICD-10-CM | POA: Diagnosis not present

## 2018-12-09 DIAGNOSIS — E43 Unspecified severe protein-calorie malnutrition: Secondary | ICD-10-CM | POA: Diagnosis not present

## 2018-12-09 DIAGNOSIS — T827XXD Infection and inflammatory reaction due to other cardiac and vascular devices, implants and grafts, subsequent encounter: Secondary | ICD-10-CM | POA: Diagnosis not present

## 2018-12-10 ENCOUNTER — Ambulatory Visit
Admission: RE | Admit: 2018-12-10 | Discharge: 2018-12-10 | Disposition: A | Discharge status: Home / Self Care | Payer: Medicare Other | Source: Ambulatory Visit | Attending: Radiation Oncology | Admitting: Radiation Oncology

## 2018-12-10 ENCOUNTER — Encounter: Payer: Self-pay | Admitting: *Deleted

## 2018-12-10 ENCOUNTER — Telehealth: Payer: Self-pay | Admitting: *Deleted

## 2018-12-10 DIAGNOSIS — Z510 Encounter for antineoplastic radiation therapy: Secondary | ICD-10-CM | POA: Diagnosis not present

## 2018-12-10 DIAGNOSIS — C320 Malignant neoplasm of glottis: Secondary | ICD-10-CM | POA: Diagnosis not present

## 2018-12-10 DIAGNOSIS — C329 Malignant neoplasm of larynx, unspecified: Secondary | ICD-10-CM

## 2018-12-10 MED ORDER — SONAFINE EX EMUL
1.0000 "application " | Freq: Once | CUTANEOUS | Status: AC
  Administered 2018-12-10: 1 via TOPICAL

## 2018-12-10 NOTE — Telephone Encounter (Signed)
CALLED PATIENT TO INFORM OF APPT. WITH DR, Logan Price ON 12-11-18 - ARRIVAL TIME- 9:45 AM, LVM FOR A RETURN CALL

## 2018-12-10 NOTE — Telephone Encounter (Signed)
XXXX 

## 2018-12-11 ENCOUNTER — Inpatient Hospital Stay: Payer: Medicare Other

## 2018-12-11 DIAGNOSIS — R338 Other retention of urine: Secondary | ICD-10-CM | POA: Diagnosis not present

## 2018-12-11 NOTE — Progress Notes (Signed)
Nutrition Follow-up:  Patient with laryngeal cancer.  Patient has completed radiation.  PEG placement 09/2018.    Spoke with patient via phone.  Reports that he continues [employer] peptide 1.5 via pump and 53ml prostat BID.  Reports that he is flushing tube with 214ml water 4 times per day.    Denies any issues with tolerating tube feeding.  Reports normal bowel movement this am. Reports that he has supplies from [place].    Medications: reviewed  Labs: reveiewed  Anthropometrics:   Reports that he weighed on Monday and was 129 lb, increased from 126 lb last week   Re-Estimated Energy Needs  Kcals: 2300-2500calories Protein: 115-130 g Fluid: > 2 L  NUTRITION DIAGNOSIS: Inadequate oral intake continues but relying on tube feeding    INTERVENTION:  Patient to continue [employer] Peptide 1.5, 4 cartons per day with weight gain.  Water flush 240 ml 4 times per day  Tube feeding provides 2200 kcals, 126 g 1989ml water (formula and flush) plus additional water flush with medications.    Patient has contact information    MONITORING, EVALUATION, GOAL: Patient to continue tolerating tube feeding to meet nutritional needs and prevent weight loss   NEXT VISIT: Friday, October 9 after MD appt  Ghali Morissette B. Zenia Resides, [place], [place] Registered Dietitian [phone] (pager)

## 2018-12-12 NOTE — Progress Notes (Signed)
Oncology Nurse Navigator Documentation  Met with Mr. Yanik prior to and after his final RT to provide post-RT education and PEG care. Provided verbal/written post-RT guidance:  Importance of keeping all follow-up appts, especially those with Nutrition and SLP.  Importance of protecting treatment area from sun.  Continuation of Sonafine application 2-3 times daily, application of abx ointment to areas of raw skin; when supply of Sonafine exhausted transition to OTC lotion with vitamin E.  Provided Epic calendar of upcoming appts. Per guidance from Dr. Kieth Brightly, CCS, relayed by RN Abigail Butts earlier this week, I removed remaining t-bar at PEG insertion.    PEG site c/d/i, no erythema.    Wafer/retention ring positioned approx 1 cm above skin surface, I adjusted to 3 cm.    Cleaned site with NS, placed single split gauze/drainage sponge under wafer.  I encouraged Mr. Treanor to call me with questions/concerns.  He agreed to do so.   Gayleen Orem, RN, BSN Head & Neck Oncology [place] at [place] [phone]

## 2018-12-13 ENCOUNTER — Encounter: Payer: Self-pay | Admitting: Radiation Oncology

## 2018-12-18 DIAGNOSIS — C320 Malignant neoplasm of glottis: Secondary | ICD-10-CM | POA: Diagnosis not present

## 2018-12-18 DIAGNOSIS — C328 Malignant neoplasm of overlapping sites of larynx: Secondary | ICD-10-CM | POA: Diagnosis not present

## 2018-12-18 DIAGNOSIS — R1319 Other dysphagia: Secondary | ICD-10-CM | POA: Diagnosis not present

## 2018-12-18 DIAGNOSIS — T827XXD Infection and inflammatory reaction due to other cardiac and vascular devices, implants and grafts, subsequent encounter: Secondary | ICD-10-CM | POA: Diagnosis not present

## 2018-12-18 DIAGNOSIS — B965 Pseudomonas (aeruginosa) (mallei) (pseudomallei) as the cause of diseases classified elsewhere: Secondary | ICD-10-CM | POA: Diagnosis not present

## 2018-12-18 DIAGNOSIS — E43 Unspecified severe protein-calorie malnutrition: Secondary | ICD-10-CM | POA: Diagnosis not present

## 2018-12-19 ENCOUNTER — Ambulatory Visit: Payer: Medicare Other | Attending: Radiation Oncology

## 2018-12-19 ENCOUNTER — Other Ambulatory Visit: Payer: Self-pay

## 2018-12-19 DIAGNOSIS — C320 Malignant neoplasm of glottis: Secondary | ICD-10-CM | POA: Diagnosis not present

## 2018-12-19 DIAGNOSIS — R1313 Dysphagia, pharyngeal phase: Secondary | ICD-10-CM | POA: Insufficient documentation

## 2018-12-19 DIAGNOSIS — C328 Malignant neoplasm of overlapping sites of larynx: Secondary | ICD-10-CM | POA: Diagnosis not present

## 2018-12-19 DIAGNOSIS — B965 Pseudomonas (aeruginosa) (mallei) (pseudomallei) as the cause of diseases classified elsewhere: Secondary | ICD-10-CM | POA: Diagnosis not present

## 2018-12-19 DIAGNOSIS — R1319 Other dysphagia: Secondary | ICD-10-CM | POA: Diagnosis not present

## 2018-12-19 DIAGNOSIS — T827XXD Infection and inflammatory reaction due to other cardiac and vascular devices, implants and grafts, subsequent encounter: Secondary | ICD-10-CM | POA: Diagnosis not present

## 2018-12-19 DIAGNOSIS — E43 Unspecified severe protein-calorie malnutrition: Secondary | ICD-10-CM | POA: Diagnosis not present

## 2018-12-19 NOTE — Therapy (Signed)
LONG TERM GOAL #2   Title  pt will undergo follow up objective swallow assessment to assess progress and/or pt safety with POs    Time  3    Period  --   visits   Status  On-going      SLP LONG TERM GOAL #3   Title  Pt will adhere to swallow precuations with POs with rare min A    Time  3    Period  --   visits   Status  On-going       Plan - 12/19/18 1713    Clinical Impression Statement  Pt presents today with (per MBSS) with cont'd severe pharyngeal dysphagia. Pt also today with aphonia despite Passy-Muir valve. However, when pt ansewred the phone he had a consistent weak voice. Dysphagia exercises (were reviewed and pt req'd cues for 3/4 exercises.Pt cont to tell SLP he completed HEP "every day", however SLP skeptical of pt's completion of full scope of prescribed HEP given his necessary level of cueing from SLP. Data state dysphagia is likely to increase during and after radiation therapy. Pt requires follow up skilled ST to assess pt safety with current PO, and to assess success with procedure of HEP.    Speech Therapy Frequency  Biweekly   every other week   Duration  --   12 weeks/90 days (or 7 total visits)   Treatment/Interventions  Aspiration precaution training;Pharyngeal strengthening exercises;Diet toleration management by SLP;Compensatory techniques;Trials of upgraded texture/liquids;Cueing hierarchy;Patient/family education;SLP instruction and feedback    Potential to Achieve Goals  Good    Potential Considerations  Severity of impairments    SLP Home Exercise Plan  provided today       Patient will benefit from skilled therapeutic intervention in order to improve the following deficits and impairments:   Dysphagia, pharyngeal  phase    Problem List Patient Active Problem List   Diagnosis Date Noted  . Pleural effusion   . Shortness of breath   . 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[place])   . Bacteremia   . Leukocytosis   . Normocytic anemia   . Protein-calorie malnutrition, severe 10/08/2018  . PSVT (paroxysmal supraventricular tachycardia) ([place]) 10/03/2018  . Atrial fibrillation ([place]) 10/03/2018  . Goals of care, counseling/discussion   . Dysphagia   . Loss of weight   . 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 Gastritis determined by endoscopy   . Esophageal dysphagia   . 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 Globus sensation 12/12/2016  . Smokes with greater than 40 pack year history 10/20/2016  . Hemorrhoids, internal, with bleeding 10/06/2015  . History of adenomatous polyp of colon 04/14/2015  . Vitamin D deficiency 10/09/2014  . HLD (hyperlipidemia) 10/08/2012  . GERD (gastroesophageal reflux disease) 10/08/2012  . HTN (hypertension)   . Diverticulitis 10/25/2009    [facility] 12/19/2018, 5:17 PM  [place] [address] [place], [name], [address] Phone: [phone]   Fax:  [phone]   Name: DECOREY WAHLERT MRN: [id number] Date of Birth: 12/17/1945  LONG TERM GOAL #2   Title  pt will undergo follow up objective swallow assessment to assess progress and/or pt safety with POs    Time  3    Period  --   visits   Status  On-going      SLP LONG TERM GOAL #3   Title  Pt will adhere to swallow precuations with POs with rare min A    Time  3    Period  --   visits   Status  On-going       Plan - 12/19/18 1713    Clinical Impression Statement  Pt presents today with (per MBSS) with cont'd severe pharyngeal dysphagia. Pt also today with aphonia despite Passy-Muir valve. However, when pt ansewred the phone he had a consistent weak voice. Dysphagia exercises (were reviewed and pt req'd cues for 3/4 exercises.Pt cont to tell SLP he completed HEP "every day", however SLP skeptical of pt's completion of full scope of prescribed HEP given his necessary level of cueing from SLP. Data state dysphagia is likely to increase during and after radiation therapy. Pt requires follow up skilled ST to assess pt safety with current PO, and to assess success with procedure of HEP.    Speech Therapy Frequency  Biweekly   every other week   Duration  --   12 weeks/90 days (or 7 total visits)   Treatment/Interventions  Aspiration precaution training;Pharyngeal strengthening exercises;Diet toleration management by SLP;Compensatory techniques;Trials of upgraded texture/liquids;Cueing hierarchy;Patient/family education;SLP instruction and feedback    Potential to Achieve Goals  Good    Potential Considerations  Severity of impairments    SLP Home Exercise Plan  provided today       Patient will benefit from skilled therapeutic intervention in order to improve the following deficits and impairments:   Dysphagia, pharyngeal  phase    Problem List Patient Active Problem List   Diagnosis Date Noted  . Pleural effusion   . Shortness of breath   . 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[place])   . Bacteremia   . Leukocytosis   . Normocytic anemia   . Protein-calorie malnutrition, severe 10/08/2018  . PSVT (paroxysmal supraventricular tachycardia) ([place]) 10/03/2018  . Atrial fibrillation ([place]) 10/03/2018  . Goals of care, counseling/discussion   . Dysphagia   . Loss of weight   . 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 Gastritis determined by endoscopy   . Esophageal dysphagia   . 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 Globus sensation 12/12/2016  . Smokes with greater than 40 pack year history 10/20/2016  . Hemorrhoids, internal, with bleeding 10/06/2015  . History of adenomatous polyp of colon 04/14/2015  . Vitamin D deficiency 10/09/2014  . HLD (hyperlipidemia) 10/08/2012  . GERD (gastroesophageal reflux disease) 10/08/2012  . HTN (hypertension)   . Diverticulitis 10/25/2009    [facility] 12/19/2018, 5:17 PM  [place] [address] [place], [name], [address] Phone: [phone]   Fax:  [phone]   Name: DECOREY WAHLERT MRN: [id number] Date of Birth: 12/17/1945  [place] [address] [place], [name], [address] Phone: [phone]   Fax:  [phone]  Speech Language Pathology Treatment  Patient Details  Name: MORTIMER BAIR MRN: [id number] Date of Birth: 1945/11/20 Referring Provider (SLP): Eppie Gibson, MD   Encounter Date: 12/19/2018    Past Medical History:  Diagnosis Date  . Diverticulitis   . 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 GERD (gastroesophageal reflux disease)   . Hepatitis C   . HTN (hypertension)   . Hyperlipidemia     Past Surgical History:  Procedure Laterality Date  . BIOPSY  05/04/2015   Procedure: BIOPSY;  Surgeon: Danie Binder, MD;  [facility];  Service: Endoscopy;;  bx's of ileocecal valve   . COLONOSCOPY WITH PROPOFOL N/A 05/04/2015   Dr. Oneida Alar: normal appearing ileum with prominent IC valve with tubular adenomas, moderate diverticulosis in sigmoid colon, ascending colon, and retum. Moderate sized internal hemorrhoids. Surveillance in 5 years  . ESOPHAGOGASTRODUODENOSCOPY (EGD) WITH PROPOFOL N/A 12/19/2016   Procedure: ESOPHAGOGASTRODUODENOSCOPY (EGD) WITH PROPOFOL;  Surgeon: Danie Binder, MD;  [facility];  Service: Endoscopy;  Laterality: N/A;  11:30am  . FLEXIBLE SIGMOIDOSCOPY N/A 12/10/2015   hemorrhoid banding X 3   . HEMORRHOID BANDING N/A 12/10/2015   Procedure: HEMORRHOID BANDING;  Surgeon: Danie Binder, MD;  [facility];  Service: Endoscopy;  Laterality: N/A;  1:30 PM  . IR CM INJ ANY COLONIC TUBE W/FLUORO  10/30/2018  . IR GASTROSTOMY TUBE MOD SED  10/04/2018  . IR IMAGING GUIDED PORT INSERTION  10/04/2018  . IR REMOVAL TUN ACCESS W/ PORT W/O FL MOD SED  10/14/2018  . IR REPLACE G-TUBE SIMPLE WO FLUORO  11/03/2018  . LAPAROSCOPIC INSERTION GASTROSTOMY TUBE Left 10/07/2018   Procedure: LAPAROSCOPIC  GASTROSTOMY TUBE;  Surgeon: Kinsinger, Arta Bruce, MD;  Location: [facility];  Service:  General;  Laterality: Left;  Marland Kitchen MICROLARYNGOSCOPY N/A 09/27/2018   Procedure: MICRO DIRECT LARYNGOSCOPY WITH BIOPSY;  Surgeon: Leta Baptist, MD;  Location: [facility];  Service: ENT;  Laterality: N/A;  . None to date     As of 04/14/15  . POLYPECTOMY  05/04/2015   Procedure: POLYPECTOMY;  Surgeon: Danie Binder, MD;  [facility];  Service: Endoscopy;;  descending colon polyp, ascending colon polyp  . SAVORY DILATION N/A 12/19/2016   Procedure: SAVORY DILATION;  Surgeon: Danie Binder, MD;  [facility];  Service: Endoscopy;  Laterality: N/A;  . TRACHEOSTOMY TUBE PLACEMENT N/A 09/27/2018   Procedure: AWAKE TRACHEOSTOMY;  Surgeon: Leta Baptist, MD;  Location: [facility];  Service: ENT;  Laterality: N/A;    There were no vitals filed for this visit.  Subjective Assessment - 12/19/18 1030    Subjective  Pt states he has done thorough oral care at home, including brushing teeth rinsing with mouthwash and then suctioning, prior to trialing PO ice chips.    Patient is accompained by:  Family member   [name]   Currently in Pain?  No/denies            ADULT SLP TREATMENT - 12/19/18 1031      General Information   Behavior/Cognition  Alert;Cooperative;Pleasant mood      Treatment Provided   Treatment provided  Dysphagia      Dysphagia Treatment   Temperature Spikes Noted  No    Respiratory Status  Trach   with PMSV   Treatment Methods

## 2018-12-19 NOTE — Patient Instructions (Signed)
Signs of Aspiration Pneumonia   . Chest pain/tightness . Fever (can be low grade) . Cough  o With foul-smelling phlegm (sputum) o With sputum containing pus or blood o With greenish sputum . Fatigue  . Shortness of breath  . Wheezing   **IF YOU HAVE THESE SIGNS, CONTACT YOUR DOCTOR OR GO TO THE EMERGENCY DEPARTMENT OR URGENT CARE AS SOON AS POSSIBLE**      

## 2018-12-25 DIAGNOSIS — R1319 Other dysphagia: Secondary | ICD-10-CM | POA: Diagnosis not present

## 2018-12-25 DIAGNOSIS — T827XXD Infection and inflammatory reaction due to other cardiac and vascular devices, implants and grafts, subsequent encounter: Secondary | ICD-10-CM | POA: Diagnosis not present

## 2018-12-25 DIAGNOSIS — B965 Pseudomonas (aeruginosa) (mallei) (pseudomallei) as the cause of diseases classified elsewhere: Secondary | ICD-10-CM | POA: Diagnosis not present

## 2018-12-25 DIAGNOSIS — E43 Unspecified severe protein-calorie malnutrition: Secondary | ICD-10-CM | POA: Diagnosis not present

## 2018-12-25 DIAGNOSIS — C320 Malignant neoplasm of glottis: Secondary | ICD-10-CM | POA: Diagnosis not present

## 2018-12-25 DIAGNOSIS — C328 Malignant neoplasm of overlapping sites of larynx: Secondary | ICD-10-CM | POA: Diagnosis not present

## 2018-12-26 DIAGNOSIS — N401 Enlarged prostate with lower urinary tract symptoms: Secondary | ICD-10-CM | POA: Diagnosis not present

## 2018-12-26 DIAGNOSIS — R338 Other retention of urine: Secondary | ICD-10-CM | POA: Diagnosis not present

## 2018-12-31 DIAGNOSIS — C328 Malignant neoplasm of overlapping sites of larynx: Secondary | ICD-10-CM | POA: Diagnosis not present

## 2018-12-31 DIAGNOSIS — B965 Pseudomonas (aeruginosa) (mallei) (pseudomallei) as the cause of diseases classified elsewhere: Secondary | ICD-10-CM | POA: Diagnosis not present

## 2018-12-31 DIAGNOSIS — C320 Malignant neoplasm of glottis: Secondary | ICD-10-CM | POA: Diagnosis not present

## 2018-12-31 DIAGNOSIS — E43 Unspecified severe protein-calorie malnutrition: Secondary | ICD-10-CM | POA: Diagnosis not present

## 2018-12-31 DIAGNOSIS — R1319 Other dysphagia: Secondary | ICD-10-CM | POA: Diagnosis not present

## 2018-12-31 DIAGNOSIS — T827XXD Infection and inflammatory reaction due to other cardiac and vascular devices, implants and grafts, subsequent encounter: Secondary | ICD-10-CM | POA: Diagnosis not present

## 2019-01-02 DIAGNOSIS — R338 Other retention of urine: Secondary | ICD-10-CM | POA: Diagnosis not present

## 2019-01-02 DIAGNOSIS — N401 Enlarged prostate with lower urinary tract symptoms: Secondary | ICD-10-CM | POA: Diagnosis not present

## 2019-01-02 NOTE — Progress Notes (Signed)
Mr. Crill presents for follow up of radiation completed 12/10/18 to his bilateral neck and glottis.   Pain issues, if any: He denies pain Using a feeding tube?: Yes, he is receiving continuous tube feedings during the day.  Weight changes, if any:  Wt Readings from Last 3 Encounters:  01/06/19 132 lb 8 oz (60.1 kg)  11/08/18 119 lb 4.3 oz (54.1 kg)  09/11/18 139 lb 4.8 oz (63.2 kg)   Swallowing issues, if any: Yes, he is only swallowing ice chips per instructions from speech therapy.  Smoking or chewing tobacco? He denies.  Using fluoride trays daily? N/A Last ENT visit was on: Dr. Benjamine Mola not since diagnosis.  Other notable issues, if any:  Skin: His skin has healed well, he continues to use radiaplex as directed.  BP 113/69 (BP Location: Left Arm, Patient Position: Sitting)   Pulse 89   Temp 98.7 F (37.1 C) (Temporal)   Resp 19   Ht 5\' 9"  (1.753 m)   Wt 132 lb 8 oz (60.1 kg)   SpO2 96%   BMI 19.57 kg/m

## 2019-01-03 ENCOUNTER — Ambulatory Visit: Payer: Medicare Other | Admitting: Radiation Oncology

## 2019-01-03 ENCOUNTER — Inpatient Hospital Stay: Payer: Medicare Other | Admitting: Nutrition

## 2019-01-03 DIAGNOSIS — E43 Unspecified severe protein-calorie malnutrition: Secondary | ICD-10-CM | POA: Diagnosis not present

## 2019-01-03 DIAGNOSIS — T827XXD Infection and inflammatory reaction due to other cardiac and vascular devices, implants and grafts, subsequent encounter: Secondary | ICD-10-CM | POA: Diagnosis not present

## 2019-01-03 DIAGNOSIS — B965 Pseudomonas (aeruginosa) (mallei) (pseudomallei) as the cause of diseases classified elsewhere: Secondary | ICD-10-CM | POA: Diagnosis not present

## 2019-01-03 DIAGNOSIS — C320 Malignant neoplasm of glottis: Secondary | ICD-10-CM | POA: Diagnosis not present

## 2019-01-03 DIAGNOSIS — C328 Malignant neoplasm of overlapping sites of larynx: Secondary | ICD-10-CM | POA: Diagnosis not present

## 2019-01-03 DIAGNOSIS — R1319 Other dysphagia: Secondary | ICD-10-CM | POA: Diagnosis not present

## 2019-01-06 ENCOUNTER — Inpatient Hospital Stay: Payer: Medicare Other | Attending: Hematology | Admitting: Nutrition

## 2019-01-06 ENCOUNTER — Encounter: Payer: Self-pay | Admitting: Radiation Oncology

## 2019-01-06 ENCOUNTER — Other Ambulatory Visit: Payer: Self-pay

## 2019-01-06 ENCOUNTER — Ambulatory Visit
Admission: RE | Admit: 2019-01-06 | Discharge: 2019-01-06 | Disposition: A | Discharge status: Home / Self Care | Payer: Medicare Other | Source: Ambulatory Visit | Attending: Radiation Oncology | Admitting: Radiation Oncology

## 2019-01-06 VITALS — BP 113/69 | HR 89 | Temp 98.7°F | Resp 19 | Ht 69.0 in | Wt 132.5 lb

## 2019-01-06 DIAGNOSIS — C320 Malignant neoplasm of glottis: Secondary | ICD-10-CM | POA: Diagnosis not present

## 2019-01-06 DIAGNOSIS — Z923 Personal history of irradiation: Secondary | ICD-10-CM | POA: Diagnosis not present

## 2019-01-06 NOTE — Progress Notes (Signed)
Nutrition follow-up completed with patient who has completed treatment for laryngeal cancer. Patient received PEG in July 2020. Current weight increased to 132 pounds from 129 pounds in September. Patient denies nutrition impact symptoms. Patient reports he is tolerating 3 bottles of [employer] peptide 1.5 continuously via pump.  He is using 30 mL of Protostat twice a day.  Continues to flush his feeding tube with 240 mL of water 4 times a day.  Estimated nutrition needs: 2300-2500 cal, 115-130 g protein, greater than 2 L fluid.  Nutrition diagnosis: Inadequate oral intake continues.  Intervention: Educated patient to increase [employer] 1.5 to 4 bottles daily.   Increase [employer] continuous feedings to 80 mL an hour x16 hours daily. He should continue free water flushes and Protostat twice a day.  Tube feeding plus Protostat provides 2200 cal and 126 g protein.  Monitoring, evaluation, goals: Patient will tolerate adequate calories and protein to promote weight maintenance/weight gain.  Next visit: To be scheduled as needed.  **Disclaimer: This note was dictated with voice recognition software. Similar sounding words can inadvertently be transcribed and this note may contain transcription errors which may not have been corrected upon publication of note.**

## 2019-01-07 DIAGNOSIS — R498 Other voice and resonance disorders: Secondary | ICD-10-CM | POA: Diagnosis not present

## 2019-01-07 DIAGNOSIS — I700 Atherosclerosis of aorta: Secondary | ICD-10-CM | POA: Diagnosis not present

## 2019-01-07 DIAGNOSIS — R339 Retention of urine, unspecified: Secondary | ICD-10-CM | POA: Diagnosis not present

## 2019-01-07 DIAGNOSIS — C320 Malignant neoplasm of glottis: Secondary | ICD-10-CM | POA: Diagnosis not present

## 2019-01-07 DIAGNOSIS — E43 Unspecified severe protein-calorie malnutrition: Secondary | ICD-10-CM | POA: Diagnosis not present

## 2019-01-07 DIAGNOSIS — Z466 Encounter for fitting and adjustment of urinary device: Secondary | ICD-10-CM | POA: Diagnosis not present

## 2019-01-07 DIAGNOSIS — R1319 Other dysphagia: Secondary | ICD-10-CM | POA: Diagnosis not present

## 2019-01-07 DIAGNOSIS — J189 Pneumonia, unspecified organism: Secondary | ICD-10-CM | POA: Diagnosis not present

## 2019-01-07 DIAGNOSIS — B965 Pseudomonas (aeruginosa) (mallei) (pseudomallei) as the cause of diseases classified elsewhere: Secondary | ICD-10-CM | POA: Diagnosis not present

## 2019-01-07 DIAGNOSIS — Z431 Encounter for attention to gastrostomy: Secondary | ICD-10-CM | POA: Diagnosis not present

## 2019-01-07 DIAGNOSIS — R1312 Dysphagia, oropharyngeal phase: Secondary | ICD-10-CM | POA: Diagnosis not present

## 2019-01-07 DIAGNOSIS — T827XXD Infection and inflammatory reaction due to other cardiac and vascular devices, implants and grafts, subsequent encounter: Secondary | ICD-10-CM | POA: Diagnosis not present

## 2019-01-07 DIAGNOSIS — J90 Pleural effusion, not elsewhere classified: Secondary | ICD-10-CM | POA: Diagnosis not present

## 2019-01-07 DIAGNOSIS — Z430 Encounter for attention to tracheostomy: Secondary | ICD-10-CM | POA: Diagnosis not present

## 2019-01-07 DIAGNOSIS — R2681 Unsteadiness on feet: Secondary | ICD-10-CM | POA: Diagnosis not present

## 2019-01-07 DIAGNOSIS — Z9981 Dependence on supplemental oxygen: Secondary | ICD-10-CM | POA: Diagnosis not present

## 2019-01-07 DIAGNOSIS — C328 Malignant neoplasm of overlapping sites of larynx: Secondary | ICD-10-CM | POA: Diagnosis not present

## 2019-01-07 DIAGNOSIS — M6281 Muscle weakness (generalized): Secondary | ICD-10-CM | POA: Diagnosis not present

## 2019-01-07 DIAGNOSIS — I10 Essential (primary) hypertension: Secondary | ICD-10-CM | POA: Diagnosis not present

## 2019-01-09 ENCOUNTER — Telehealth: Payer: Self-pay

## 2019-01-09 ENCOUNTER — Other Ambulatory Visit (INDEPENDENT_AMBULATORY_CARE_PROVIDER_SITE_OTHER): Payer: Self-pay | Admitting: *Deleted

## 2019-01-09 DIAGNOSIS — B965 Pseudomonas (aeruginosa) (mallei) (pseudomallei) as the cause of diseases classified elsewhere: Secondary | ICD-10-CM | POA: Diagnosis not present

## 2019-01-09 DIAGNOSIS — R1319 Other dysphagia: Secondary | ICD-10-CM | POA: Diagnosis not present

## 2019-01-09 DIAGNOSIS — C328 Malignant neoplasm of overlapping sites of larynx: Secondary | ICD-10-CM | POA: Diagnosis not present

## 2019-01-09 DIAGNOSIS — C320 Malignant neoplasm of glottis: Secondary | ICD-10-CM | POA: Diagnosis not present

## 2019-01-09 DIAGNOSIS — E43 Unspecified severe protein-calorie malnutrition: Secondary | ICD-10-CM | POA: Diagnosis not present

## 2019-01-09 DIAGNOSIS — R1310 Dysphagia, unspecified: Secondary | ICD-10-CM

## 2019-01-09 DIAGNOSIS — T827XXD Infection and inflammatory reaction due to other cardiac and vascular devices, implants and grafts, subsequent encounter: Secondary | ICD-10-CM | POA: Diagnosis not present

## 2019-01-09 NOTE — Telephone Encounter (Signed)
Looks like patient is being seen by Dr. Olevia Perches office at this time.

## 2019-01-09 NOTE — Telephone Encounter (Signed)
Prescription has been taken care of.

## 2019-01-09 NOTE — Telephone Encounter (Signed)
Insurance will not cover the Pantoprazole. Will have patient to check with Insurance.

## 2019-01-09 NOTE — Telephone Encounter (Signed)
Will route request to Dr. Olevia Perches office

## 2019-01-09 NOTE — Telephone Encounter (Signed)
Refill request received from [facility] for Pantoprazole 40 mg take one tab po day #90, 3 rfs . Routing to [facility] refill box.

## 2019-01-10 NOTE — Progress Notes (Signed)
Radiation Oncology         [phone] ________________________________  Name: Logan Price MRN: [id number]  Date: 01/06/2019  DOB: [birth year]  Follow-Up Visit Note  CC: Logan Spencer, FNP  Logan Pies, MD  Diagnosis and Prior Radiotherapy:       ICD-10-CM   1. Squamous cell carcinoma of glottis ([facility])  C32.0     CHIEF COMPLAINT:  Here for follow-up and surveillance of throat cancer  Narrative:  The patient returns today for routine follow-up.  Logan Price presents for follow up of radiation completed 12/10/18 to his bilateral neck and glottis.   Pain issues, if any: He denies pain Using a feeding tube?: Yes, he is receiving continuous tube feedings during the day.  Weight changes, if any:  Wt Readings from Last 3 Encounters:  01/06/19 132 lb 8 oz (60.1 kg)  11/08/18 119 lb 4.3 oz (54.1 kg)  09/11/18 139 lb 4.8 oz (63.2 kg)   Swallowing issues, if any: Yes, he is only swallowing ice chips per instructions from speech therapy.  Smoking or chewing tobacco? He denies.  Using fluoride trays daily? N/A Last ENT visit was on: Logan Price, not since diagnosis.  Other notable issues, if any:  Skin: His skin has healed well, he continues to use radiaplex as directed.                  ALLERGIES:  has No Known Allergies.  Meds: Current Outpatient Medications  Medication Sig Dispense Refill  . Amino Acids-Protein Hydrolys (FEEDING SUPPLEMENT, PRO-STAT SUGAR FREE 64,) LIQD Place 30 mLs into feeding tube 2 (two) times daily. 887 mL 15  . Cholecalciferol (VITAMIN D) 2000 units CAPS Take 2,000 Units by mouth daily.    Marland Kitchen doxazosin (CARDURA) 1 MG tablet Place 1 tablet (1 mg total) into feeding tube daily. 30 tablet 5  . fluticasone (FLONASE) 50 MCG/ACT nasal spray SPRAY 2 SPRAYS INTO EACH NOSTRIL EVERY DAY 48 mL 1  . hydrocortisone (ANUSOL-HC) 2.5 % rectal cream Apply 1 application topically 4 (four) times daily as needed for hemorrhoids or anal itching. 30 g 3  . ipratropium-albuterol  (DUONEB) 0.5-2.5 (3) MG/3ML SOLN Take 3 mLs by nebulization every 4 (four) hours as needed. 360 mL 4  . loperamide HCl (IMODIUM) 1 MG/7.5ML suspension Place 15 mLs (2 mg total) into feeding tube as needed for diarrhea or loose stools. 120 mL 1  . metoprolol tartrate (LOPRESSOR) 25 mg/10 mL SUSP Place 20 mLs (50 mg total) into feeding tube 3 (three) times daily. 240 mL 4  . Nutritional Supplements ([employer] PEPTIDE 1.5) LIQD 325 mLs by PEG Tube route continuous. 325 mL 12  . pantoprazole (PROTONIX) 40 MG tablet 1 po 30 mins prior to first meal (Patient taking differently: Take 40 mg by mouth daily. 1 po 30 mins prior to first meal) 90 tablet 3  . scopolamine (TRANSDERM-SCOP) 1 MG/3DAYS Place 1 patch (1.5 mg total) onto the skin every 3 (three) days. 10 patch 12  . simvastatin (ZOCOR) 20 MG tablet TAKE 1 TABLET AT BEDTIME 90 tablet 0  . Water For Irrigation, Sterile (FREE WATER) SOLN Place 200 mLs into feeding tube every 4 (four) hours.     No current facility-administered medications for this encounter.     Physical Findings: The patient is in no acute distress. Patient is alert and oriented. Wt Readings from Last 3 Encounters:  01/06/19 132 lb 8 oz (60.1 kg)  11/08/18 119 lb 4.3 oz (54.1 kg)  09/11/18 139 lb 4.8 oz (63.2 kg)    height is 5\' 9"  (1.753 m) and weight is 132 lb 8 oz (60.1 kg). His temporal temperature is 98.7 F (37.1 C). His blood pressure is 113/69 and his pulse is 89. His respiration is 19 and oxygen saturation is 96%. .  General: Alert and oriented, in no acute distress HEENT: Head is normocephalic. Extraocular movements are intact. Oropharynx is notable for no thrush or lesions Neck: Neck is notable for skin healing well, +trach Skin: Skin in treatment fields shows satisfactory healing   Lymphatics: see Neck Exam Psychiatric: Judgment and insight are intact. Affect is appropriate.   Lab Findings: Lab Results  Component Value Date   WBC 10.6 11/14/2018   HGB 12.6  (L) 11/14/2018   HCT 38.6 11/14/2018   MCV 86 11/14/2018   PLT 408 11/14/2018    Lab Results  Component Value Date   TSH 2.426 10/03/2018    Radiographic Findings: No results found.  Impression/Plan:    1) Head and Neck Cancer Status: healing well from treatment  2) Nutritional Status: stable PEG tube: using  3) Risk Factors: The patient has been educated about risk factors including alcohol and tobacco abuse; they understand that avoidance of alcohol and tobacco is important to prevent recurrences as well as other cancers  4) Swallowing: continue SLP exercises  5)  Thyroid function: will check annually Lab Results  Component Value Date   TSH 2.426 10/03/2018    6)  Follow-up in mid December with restaging PET. The patient was encouraged to call with any issues or questions before then.  I explained that it would not be a good idea for him to have his trach removed before we establish that he is in remission.   I spent 10 minutes minutes face to face with the patient and more than 50% of that time was spent in counseling and/or coordination of care. _____________________________________   Lonie Peak, MD

## 2019-01-13 DIAGNOSIS — R1319 Other dysphagia: Secondary | ICD-10-CM | POA: Diagnosis not present

## 2019-01-13 DIAGNOSIS — C320 Malignant neoplasm of glottis: Secondary | ICD-10-CM | POA: Diagnosis not present

## 2019-01-13 DIAGNOSIS — T827XXD Infection and inflammatory reaction due to other cardiac and vascular devices, implants and grafts, subsequent encounter: Secondary | ICD-10-CM | POA: Diagnosis not present

## 2019-01-13 DIAGNOSIS — B965 Pseudomonas (aeruginosa) (mallei) (pseudomallei) as the cause of diseases classified elsewhere: Secondary | ICD-10-CM | POA: Diagnosis not present

## 2019-01-13 DIAGNOSIS — C328 Malignant neoplasm of overlapping sites of larynx: Secondary | ICD-10-CM | POA: Diagnosis not present

## 2019-01-13 DIAGNOSIS — E43 Unspecified severe protein-calorie malnutrition: Secondary | ICD-10-CM | POA: Diagnosis not present

## 2019-01-14 ENCOUNTER — Encounter: Payer: Self-pay | Admitting: Radiation Oncology

## 2019-01-14 ENCOUNTER — Other Ambulatory Visit: Payer: Self-pay | Admitting: Radiation Oncology

## 2019-01-14 DIAGNOSIS — R5383 Other fatigue: Secondary | ICD-10-CM

## 2019-01-14 DIAGNOSIS — Z1329 Encounter for screening for other suspected endocrine disorder: Secondary | ICD-10-CM

## 2019-01-14 DIAGNOSIS — C320 Malignant neoplasm of glottis: Secondary | ICD-10-CM

## 2019-01-14 NOTE — Progress Notes (Signed)
I decided to order CT scans for restaging as the patient never had an initial PET scan to which we can compare for treatment response. -----------------------------------  Eppie Gibson, MD

## 2019-01-15 DIAGNOSIS — T827XXD Infection and inflammatory reaction due to other cardiac and vascular devices, implants and grafts, subsequent encounter: Secondary | ICD-10-CM | POA: Diagnosis not present

## 2019-01-15 DIAGNOSIS — C320 Malignant neoplasm of glottis: Secondary | ICD-10-CM | POA: Diagnosis not present

## 2019-01-15 DIAGNOSIS — E43 Unspecified severe protein-calorie malnutrition: Secondary | ICD-10-CM | POA: Diagnosis not present

## 2019-01-15 DIAGNOSIS — B965 Pseudomonas (aeruginosa) (mallei) (pseudomallei) as the cause of diseases classified elsewhere: Secondary | ICD-10-CM | POA: Diagnosis not present

## 2019-01-15 DIAGNOSIS — C328 Malignant neoplasm of overlapping sites of larynx: Secondary | ICD-10-CM | POA: Diagnosis not present

## 2019-01-15 DIAGNOSIS — R1319 Other dysphagia: Secondary | ICD-10-CM | POA: Diagnosis not present

## 2019-01-16 ENCOUNTER — Ambulatory Visit: Payer: Medicare Other | Attending: Radiation Oncology

## 2019-01-16 ENCOUNTER — Other Ambulatory Visit: Payer: Self-pay

## 2019-01-16 DIAGNOSIS — R1313 Dysphagia, pharyngeal phase: Secondary | ICD-10-CM | POA: Insufficient documentation

## 2019-01-16 NOTE — Therapy (Signed)
[facility] [address] [place], [name], [address] Phone: [phone]   Fax:  [phone]  Speech Language Pathology Treatment  Patient Details  Name: Logan Price MRN: [id number] Date of Birth: 11/22/1945 Referring Provider (SLP): Eppie Gibson, MD   Encounter Date: 01/16/2019  End of Session - 01/16/19 1141    Visit Number  3    Number of Visits  7    Date for SLP Re-Evaluation  02/19/19       Past Medical History:  Diagnosis Date  . Diverticulitis   . 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 GERD (gastroesophageal reflux disease)   . Hepatitis C   . HTN (hypertension)   . Hyperlipidemia     Past Surgical History:  Procedure Laterality Date  . BIOPSY  05/04/2015   Procedure: BIOPSY;  Surgeon: Danie Binder, MD;  [facility];  Service: Endoscopy;;  bx's of ileocecal valve   . COLONOSCOPY WITH PROPOFOL N/A 05/04/2015   Dr. Oneida Alar: normal appearing ileum with prominent IC valve with tubular adenomas, moderate diverticulosis in sigmoid colon, ascending colon, and retum. Moderate sized internal hemorrhoids. Surveillance in 5 years  . ESOPHAGOGASTRODUODENOSCOPY (EGD) WITH PROPOFOL N/A 12/19/2016   Procedure: ESOPHAGOGASTRODUODENOSCOPY (EGD) WITH PROPOFOL;  Surgeon: Danie Binder, MD;  [facility];  Service: Endoscopy;  Laterality: N/A;  11:30am  . FLEXIBLE SIGMOIDOSCOPY N/A 12/10/2015   hemorrhoid banding X 3   . HEMORRHOID BANDING N/A 12/10/2015   Procedure: HEMORRHOID BANDING;  Surgeon: Danie Binder, MD;  [facility];  Service: Endoscopy;  Laterality: N/A;  1:30 PM  . IR CM INJ ANY COLONIC TUBE W/FLUORO  10/30/2018  . IR GASTROSTOMY TUBE MOD SED  10/04/2018  . IR IMAGING GUIDED PORT INSERTION  10/04/2018  . IR REMOVAL TUN ACCESS W/ PORT W/O FL MOD SED  10/14/2018  . IR REPLACE G-TUBE SIMPLE WO FLUORO  11/03/2018  . LAPAROSCOPIC INSERTION GASTROSTOMY TUBE Left  10/07/2018   Procedure: LAPAROSCOPIC  GASTROSTOMY TUBE;  Surgeon: Kinsinger, Arta Bruce, MD;  Location: [facility];  Service: General;  Laterality: Left;  Marland Kitchen MICROLARYNGOSCOPY N/A 09/27/2018   Procedure: MICRO DIRECT LARYNGOSCOPY WITH BIOPSY;  Surgeon: Leta Baptist, MD;  [facility];  Service: ENT;  Laterality: N/A;  . None to date     As of 04/14/15  . POLYPECTOMY  05/04/2015   Procedure: POLYPECTOMY;  Surgeon: Danie Binder, MD;  [facility];  Service: Endoscopy;;  descending colon polyp, ascending colon polyp  . SAVORY DILATION N/A 12/19/2016   Procedure: SAVORY DILATION;  Surgeon: Danie Binder, MD;  [facility];  Service: Endoscopy;  Laterality: N/A;  . TRACHEOSTOMY TUBE PLACEMENT N/A 09/27/2018   Procedure: AWAKE TRACHEOSTOMY;  Surgeon: Leta Baptist, MD;  [facility];  Service: ENT;  Laterality: N/A;    There were no vitals filed for this visit.  Subjective Assessment - 01/16/19 1016    Subjective  "I do ice chips about 2-3 times a day." (about 3/4 cup/day). Pt saw Dr. Isidore Moos Tuesday and pt stated RN listened to his lungs and there was no concern mentioned at that time.    Currently in Pain?  No/denies            ADULT SLP TREATMENT - 01/16/19 1017      General Information   Behavior/Cognition  Alert;Cooperative;Pleasant mood      Treatment Provided   Treatment provided  HEP appropriately with modified independecne    Time  1    Period  --   visits   Status  On-going      SLP LONG TERM GOAL #2   Title  pt will undergo follow up objective swallow assessment to assess progress and/or pt safety with POs    Time  2    Period  --   visits   Status  On-going      SLP LONG TERM GOAL #3   Title  Pt will adhere to swallow precuations with POs with rare min A    Time  2    Period  --   visits   Status  On-going         Patient will benefit from skilled therapeutic intervention in order to improve the following deficits and impairments:   Dysphagia, pharyngeal phase    Problem List Patient Active Problem List   Diagnosis Date Noted  . Pleural effusion   . Shortness of breath   . 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[place])   . Bacteremia   . Leukocytosis   . Normocytic anemia   . Protein-calorie malnutrition, severe 10/08/2018  . PSVT (paroxysmal supraventricular tachycardia) ([place]) 10/03/2018  . Atrial fibrillation ([place]) 10/03/2018  . Goals of care, counseling/discussion   . Dysphagia   . Loss of weight   . 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 Gastritis determined by endoscopy   . Esophageal dysphagia   . 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 Globus sensation 12/12/2016  . Smokes with greater than 40 pack year history 10/20/2016  . Hemorrhoids, internal, with bleeding 10/06/2015  . History of adenomatous polyp of colon 04/14/2015  . Vitamin D deficiency 10/09/2014  . HLD (hyperlipidemia) 10/08/2012  . GERD  (gastroesophageal reflux disease) 10/08/2012  . HTN (hypertension)   . Diverticulitis 10/25/2009    [facility] ,[place], [place]  01/16/2019, 11:43 AM  [facility] [address] [place], [name], [address] Phone: [phone]   Fax:  [phone]   Name: Logan Price MRN: [id number] Date of Birth: [birth year]  [facility] [address] [place], [name], [address] Phone: [phone]   Fax:  [phone]  Speech Language Pathology Treatment  Patient Details  Name: Logan Price MRN: [id number] Date of Birth: 11/22/1945 Referring Provider (SLP): Eppie Gibson, MD   Encounter Date: 01/16/2019  End of Session - 01/16/19 1141    Visit Number  3    Number of Visits  7    Date for SLP Re-Evaluation  02/19/19       Past Medical History:  Diagnosis Date  . Diverticulitis   . 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 GERD (gastroesophageal reflux disease)   . Hepatitis C   . HTN (hypertension)   . Hyperlipidemia     Past Surgical History:  Procedure Laterality Date  . BIOPSY  05/04/2015   Procedure: BIOPSY;  Surgeon: Danie Binder, MD;  [facility];  Service: Endoscopy;;  bx's of ileocecal valve   . COLONOSCOPY WITH PROPOFOL N/A 05/04/2015   Dr. Oneida Alar: normal appearing ileum with prominent IC valve with tubular adenomas, moderate diverticulosis in sigmoid colon, ascending colon, and retum. Moderate sized internal hemorrhoids. Surveillance in 5 years  . ESOPHAGOGASTRODUODENOSCOPY (EGD) WITH PROPOFOL N/A 12/19/2016   Procedure: ESOPHAGOGASTRODUODENOSCOPY (EGD) WITH PROPOFOL;  Surgeon: Danie Binder, MD;  [facility];  Service: Endoscopy;  Laterality: N/A;  11:30am  . FLEXIBLE SIGMOIDOSCOPY N/A 12/10/2015   hemorrhoid banding X 3   . HEMORRHOID BANDING N/A 12/10/2015   Procedure: HEMORRHOID BANDING;  Surgeon: Danie Binder, MD;  [facility];  Service: Endoscopy;  Laterality: N/A;  1:30 PM  . IR CM INJ ANY COLONIC TUBE W/FLUORO  10/30/2018  . IR GASTROSTOMY TUBE MOD SED  10/04/2018  . IR IMAGING GUIDED PORT INSERTION  10/04/2018  . IR REMOVAL TUN ACCESS W/ PORT W/O FL MOD SED  10/14/2018  . IR REPLACE G-TUBE SIMPLE WO FLUORO  11/03/2018  . LAPAROSCOPIC INSERTION GASTROSTOMY TUBE Left  10/07/2018   Procedure: LAPAROSCOPIC  GASTROSTOMY TUBE;  Surgeon: Kinsinger, Arta Bruce, MD;  Location: [facility];  Service: General;  Laterality: Left;  Marland Kitchen MICROLARYNGOSCOPY N/A 09/27/2018   Procedure: MICRO DIRECT LARYNGOSCOPY WITH BIOPSY;  Surgeon: Leta Baptist, MD;  Location: [facility] OR;  Service: ENT;  Laterality: N/A;  . None to date     As of 04/14/15  . POLYPECTOMY  05/04/2015   Procedure: POLYPECTOMY;  Surgeon: Danie Binder, MD;  [facility];  Service: Endoscopy;;  descending colon polyp, ascending colon polyp  . SAVORY DILATION N/A 12/19/2016   Procedure: SAVORY DILATION;  Surgeon: Danie Binder, MD;  [facility];  Service: Endoscopy;  Laterality: N/A;  . TRACHEOSTOMY TUBE PLACEMENT N/A 09/27/2018   Procedure: AWAKE TRACHEOSTOMY;  Surgeon: Leta Baptist, MD;  [facility];  Service: ENT;  Laterality: N/A;    There were no vitals filed for this visit.  Subjective Assessment - 01/16/19 1016    Subjective  "I do ice chips about 2-3 times a day." (about 3/4 cup/day). Pt saw Dr. Isidore Moos Tuesday and pt stated RN listened to his lungs and there was no concern mentioned at that time.    Currently in Pain?  No/denies            ADULT SLP TREATMENT - 01/16/19 1017      General Information   Behavior/Cognition  Alert;Cooperative;Pleasant mood      Treatment Provided   Treatment provided

## 2019-01-17 ENCOUNTER — Other Ambulatory Visit: Payer: Self-pay | Admitting: Radiation Oncology

## 2019-01-17 DIAGNOSIS — C320 Malignant neoplasm of glottis: Secondary | ICD-10-CM

## 2019-01-21 DIAGNOSIS — R338 Other retention of urine: Secondary | ICD-10-CM | POA: Diagnosis not present

## 2019-01-22 DIAGNOSIS — T827XXD Infection and inflammatory reaction due to other cardiac and vascular devices, implants and grafts, subsequent encounter: Secondary | ICD-10-CM | POA: Diagnosis not present

## 2019-01-22 DIAGNOSIS — B965 Pseudomonas (aeruginosa) (mallei) (pseudomallei) as the cause of diseases classified elsewhere: Secondary | ICD-10-CM | POA: Diagnosis not present

## 2019-01-22 DIAGNOSIS — C328 Malignant neoplasm of overlapping sites of larynx: Secondary | ICD-10-CM | POA: Diagnosis not present

## 2019-01-22 DIAGNOSIS — E43 Unspecified severe protein-calorie malnutrition: Secondary | ICD-10-CM | POA: Diagnosis not present

## 2019-01-22 DIAGNOSIS — C320 Malignant neoplasm of glottis: Secondary | ICD-10-CM | POA: Diagnosis not present

## 2019-01-22 DIAGNOSIS — R1319 Other dysphagia: Secondary | ICD-10-CM | POA: Diagnosis not present

## 2019-01-24 ENCOUNTER — Other Ambulatory Visit: Payer: Self-pay | Admitting: *Deleted

## 2019-01-24 DIAGNOSIS — E43 Unspecified severe protein-calorie malnutrition: Secondary | ICD-10-CM | POA: Diagnosis not present

## 2019-01-24 DIAGNOSIS — T827XXD Infection and inflammatory reaction due to other cardiac and vascular devices, implants and grafts, subsequent encounter: Secondary | ICD-10-CM | POA: Diagnosis not present

## 2019-01-24 DIAGNOSIS — C320 Malignant neoplasm of glottis: Secondary | ICD-10-CM | POA: Diagnosis not present

## 2019-01-24 DIAGNOSIS — E785 Hyperlipidemia, unspecified: Secondary | ICD-10-CM

## 2019-01-24 DIAGNOSIS — B965 Pseudomonas (aeruginosa) (mallei) (pseudomallei) as the cause of diseases classified elsewhere: Secondary | ICD-10-CM | POA: Diagnosis not present

## 2019-01-24 DIAGNOSIS — C328 Malignant neoplasm of overlapping sites of larynx: Secondary | ICD-10-CM | POA: Diagnosis not present

## 2019-01-24 DIAGNOSIS — R1319 Other dysphagia: Secondary | ICD-10-CM | POA: Diagnosis not present

## 2019-01-24 MED ORDER — SIMVASTATIN 20 MG PO TABS: 20.0000 mg | ORAL_TABLET | Freq: Every day | ORAL | 0 refills | Status: DC

## 2019-01-27 NOTE — Progress Notes (Signed)
Patient Name: Logan Price MRN: [id number] DOB: 1945-06-05 Referring Physician: Suszanne Conners SUI (Profile Not Attached) Date of Service: 12/10/2018 [facility]-,                                                         End Of Treatment Note  Diagnoses: C32.0-Malignant neoplasm of glottis  Cancer Staging: Cancer Staging Squamous cell carcinoma of glottis (HCC) Staging form: Larynx - Glottis, AJCC 8th Edition - Clinical stage from 10/09/2018: Stage IVB (cT4b, cN3b, cM0) - Signed by Lonie Peak, MD on 10/09/2018  Intent: Curative  Radiation Treatment Dates: 10/22/2018 through 12/10/2018 Site Technique Total Dose Dose per Fx Completed Fx Beam Energies  Head & neck: HN_larynx IMRT 70/70 2 35/35 6X   Narrative: The patient tolerated radiation therapy relatively well.  Breathing and fullness in throat improved.  Plan: The patient will follow-up with radiation oncology in 2 weeks .  -----------------------------------  Lonie Peak, MD

## 2019-01-28 DIAGNOSIS — N401 Enlarged prostate with lower urinary tract symptoms: Secondary | ICD-10-CM | POA: Diagnosis not present

## 2019-01-28 DIAGNOSIS — R338 Other retention of urine: Secondary | ICD-10-CM | POA: Diagnosis not present

## 2019-01-29 DIAGNOSIS — C320 Malignant neoplasm of glottis: Secondary | ICD-10-CM | POA: Diagnosis not present

## 2019-01-29 DIAGNOSIS — R1319 Other dysphagia: Secondary | ICD-10-CM | POA: Diagnosis not present

## 2019-01-29 DIAGNOSIS — B965 Pseudomonas (aeruginosa) (mallei) (pseudomallei) as the cause of diseases classified elsewhere: Secondary | ICD-10-CM | POA: Diagnosis not present

## 2019-01-29 DIAGNOSIS — C328 Malignant neoplasm of overlapping sites of larynx: Secondary | ICD-10-CM | POA: Diagnosis not present

## 2019-01-29 DIAGNOSIS — T827XXD Infection and inflammatory reaction due to other cardiac and vascular devices, implants and grafts, subsequent encounter: Secondary | ICD-10-CM | POA: Diagnosis not present

## 2019-01-29 DIAGNOSIS — E43 Unspecified severe protein-calorie malnutrition: Secondary | ICD-10-CM | POA: Diagnosis not present

## 2019-01-30 DIAGNOSIS — T827XXD Infection and inflammatory reaction due to other cardiac and vascular devices, implants and grafts, subsequent encounter: Secondary | ICD-10-CM | POA: Diagnosis not present

## 2019-01-30 DIAGNOSIS — C328 Malignant neoplasm of overlapping sites of larynx: Secondary | ICD-10-CM | POA: Diagnosis not present

## 2019-01-30 DIAGNOSIS — E43 Unspecified severe protein-calorie malnutrition: Secondary | ICD-10-CM | POA: Diagnosis not present

## 2019-01-30 DIAGNOSIS — R1319 Other dysphagia: Secondary | ICD-10-CM | POA: Diagnosis not present

## 2019-01-30 DIAGNOSIS — B965 Pseudomonas (aeruginosa) (mallei) (pseudomallei) as the cause of diseases classified elsewhere: Secondary | ICD-10-CM | POA: Diagnosis not present

## 2019-01-30 DIAGNOSIS — C320 Malignant neoplasm of glottis: Secondary | ICD-10-CM | POA: Diagnosis not present

## 2019-02-04 ENCOUNTER — Inpatient Hospital Stay: Payer: Medicare Other | Attending: Hematology | Admitting: Nutrition

## 2019-02-04 NOTE — Progress Notes (Signed)
Contacted patient by telephone for nutrition follow up. He was not available but I was able to leave a message for him to return my telephone call.

## 2019-02-05 ENCOUNTER — Other Ambulatory Visit: Payer: Self-pay | Admitting: Urology

## 2019-02-05 DIAGNOSIS — C328 Malignant neoplasm of overlapping sites of larynx: Secondary | ICD-10-CM | POA: Diagnosis not present

## 2019-02-05 DIAGNOSIS — E43 Unspecified severe protein-calorie malnutrition: Secondary | ICD-10-CM | POA: Diagnosis not present

## 2019-02-05 DIAGNOSIS — C320 Malignant neoplasm of glottis: Secondary | ICD-10-CM | POA: Diagnosis not present

## 2019-02-05 DIAGNOSIS — T827XXD Infection and inflammatory reaction due to other cardiac and vascular devices, implants and grafts, subsequent encounter: Secondary | ICD-10-CM | POA: Diagnosis not present

## 2019-02-05 DIAGNOSIS — R1319 Other dysphagia: Secondary | ICD-10-CM | POA: Diagnosis not present

## 2019-02-05 DIAGNOSIS — B965 Pseudomonas (aeruginosa) (mallei) (pseudomallei) as the cause of diseases classified elsewhere: Secondary | ICD-10-CM | POA: Diagnosis not present

## 2019-02-06 DIAGNOSIS — Z466 Encounter for fitting and adjustment of urinary device: Secondary | ICD-10-CM | POA: Diagnosis not present

## 2019-02-06 DIAGNOSIS — M6281 Muscle weakness (generalized): Secondary | ICD-10-CM | POA: Diagnosis not present

## 2019-02-06 DIAGNOSIS — I700 Atherosclerosis of aorta: Secondary | ICD-10-CM | POA: Diagnosis not present

## 2019-02-06 DIAGNOSIS — R498 Other voice and resonance disorders: Secondary | ICD-10-CM | POA: Diagnosis not present

## 2019-02-06 DIAGNOSIS — R2681 Unsteadiness on feet: Secondary | ICD-10-CM | POA: Diagnosis not present

## 2019-02-06 DIAGNOSIS — C328 Malignant neoplasm of overlapping sites of larynx: Secondary | ICD-10-CM | POA: Diagnosis not present

## 2019-02-06 DIAGNOSIS — E43 Unspecified severe protein-calorie malnutrition: Secondary | ICD-10-CM | POA: Diagnosis not present

## 2019-02-06 DIAGNOSIS — J90 Pleural effusion, not elsewhere classified: Secondary | ICD-10-CM | POA: Diagnosis not present

## 2019-02-06 DIAGNOSIS — R339 Retention of urine, unspecified: Secondary | ICD-10-CM | POA: Diagnosis not present

## 2019-02-06 DIAGNOSIS — B965 Pseudomonas (aeruginosa) (mallei) (pseudomallei) as the cause of diseases classified elsewhere: Secondary | ICD-10-CM | POA: Diagnosis not present

## 2019-02-06 DIAGNOSIS — I10 Essential (primary) hypertension: Secondary | ICD-10-CM | POA: Diagnosis not present

## 2019-02-06 DIAGNOSIS — Z9981 Dependence on supplemental oxygen: Secondary | ICD-10-CM | POA: Diagnosis not present

## 2019-02-06 DIAGNOSIS — Z430 Encounter for attention to tracheostomy: Secondary | ICD-10-CM | POA: Diagnosis not present

## 2019-02-06 DIAGNOSIS — C320 Malignant neoplasm of glottis: Secondary | ICD-10-CM | POA: Diagnosis not present

## 2019-02-06 DIAGNOSIS — Z431 Encounter for attention to gastrostomy: Secondary | ICD-10-CM | POA: Diagnosis not present

## 2019-02-06 DIAGNOSIS — J189 Pneumonia, unspecified organism: Secondary | ICD-10-CM | POA: Diagnosis not present

## 2019-02-06 DIAGNOSIS — R1312 Dysphagia, oropharyngeal phase: Secondary | ICD-10-CM | POA: Diagnosis not present

## 2019-02-06 DIAGNOSIS — R1319 Other dysphagia: Secondary | ICD-10-CM | POA: Diagnosis not present

## 2019-02-06 DIAGNOSIS — T827XXD Infection and inflammatory reaction due to other cardiac and vascular devices, implants and grafts, subsequent encounter: Secondary | ICD-10-CM | POA: Diagnosis not present

## 2019-02-07 DIAGNOSIS — C320 Malignant neoplasm of glottis: Secondary | ICD-10-CM | POA: Diagnosis not present

## 2019-02-07 DIAGNOSIS — E43 Unspecified severe protein-calorie malnutrition: Secondary | ICD-10-CM | POA: Diagnosis not present

## 2019-02-07 DIAGNOSIS — T827XXD Infection and inflammatory reaction due to other cardiac and vascular devices, implants and grafts, subsequent encounter: Secondary | ICD-10-CM | POA: Diagnosis not present

## 2019-02-07 DIAGNOSIS — C328 Malignant neoplasm of overlapping sites of larynx: Secondary | ICD-10-CM | POA: Diagnosis not present

## 2019-02-07 DIAGNOSIS — R1319 Other dysphagia: Secondary | ICD-10-CM | POA: Diagnosis not present

## 2019-02-07 DIAGNOSIS — B965 Pseudomonas (aeruginosa) (mallei) (pseudomallei) as the cause of diseases classified elsewhere: Secondary | ICD-10-CM | POA: Diagnosis not present

## 2019-02-12 ENCOUNTER — Telehealth: Payer: Self-pay | Admitting: *Deleted

## 2019-02-12 NOTE — Telephone Encounter (Signed)
Oncology Nurse Navigator Documentation  In support of appointment compliance for H&N patient follow-up with SLP Garald Balding, called patient, confirmed understanding of tomorrow's 3:30 appointment at [facility], encouraged arrival 15-20 minutes prior for registration followed by arrival to Radiation Waiting.    Gayleen Orem, RN, BSN Head & Neck Oncology Nurse [place] [facility] [place] [phone]

## 2019-02-13 ENCOUNTER — Ambulatory Visit: Payer: Medicare Other | Attending: Radiation Oncology

## 2019-02-13 ENCOUNTER — Encounter: Payer: Medicare Other | Admitting: Speech Pathology

## 2019-02-13 ENCOUNTER — Other Ambulatory Visit: Payer: Self-pay

## 2019-02-13 DIAGNOSIS — R338 Other retention of urine: Secondary | ICD-10-CM | POA: Diagnosis not present

## 2019-02-13 DIAGNOSIS — R1313 Dysphagia, pharyngeal phase: Secondary | ICD-10-CM | POA: Insufficient documentation

## 2019-02-13 NOTE — Therapy (Signed)
positive  serological test for hepatitis C 12/13/2016  . Globus sensation 12/12/2016  . Smokes with greater than 40 pack year history 10/20/2016  . Hemorrhoids, internal, with bleeding 10/06/2015  . History of adenomatous polyp of colon 04/14/2015  . Vitamin D deficiency 10/09/2014  . HLD (hyperlipidemia) 10/08/2012  . GERD (gastroesophageal reflux disease) 10/08/2012  . HTN (hypertension)   . Diverticulitis 10/25/2009    [facility] ,[place], CCC-SLP  02/13/2019, 4:18 PM  Log[facility] Price [address] [place], [name], [address] Phone: [phone]   Fax:  [phone]   Name: Logan Price MRN: [id number] Date of Birth: July 06, 1945  [facility] [address] [place], [name], [address] Phone: [phone]   Fax:  [phone]  Speech Language Pathology Discharge Summary Patient Details  Name: Logan Price MRN: [id number] Date of Birth: 1945/06/09 Referring Provider (SLP): Eppie Gibson, MD   Encounter Date: 02/13/2019  End of Session - 02/13/19 1558    Visit Number  0 (3 total - 1 eval on 11-21-18, and 2 therapy visits)    Number of Visits  7    Date for SLP Re-Evaluation  02/19/19    SLP Start Time  3    SLP Stop Time   1545    SLP Time Calculation (min)  11 min       Past Medical History:  Diagnosis Date  . Diverticulitis   . 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 GERD (gastroesophageal reflux disease)   . Hepatitis C   . HTN (hypertension)   . Hyperlipidemia     Past Surgical History:  Procedure Laterality Date  . BIOPSY  05/04/2015   Procedure: BIOPSY;  Surgeon: Danie Binder, MD;  [facility];  Service: Endoscopy;;  bx's of ileocecal valve   . COLONOSCOPY WITH PROPOFOL N/A 05/04/2015   Dr. Oneida Alar: normal appearing ileum with prominent IC valve with tubular adenomas, moderate diverticulosis in sigmoid colon, ascending colon, and retum. Moderate sized internal hemorrhoids. Surveillance in 5 years  . ESOPHAGOGASTRODUODENOSCOPY (EGD) WITH PROPOFOL N/A 12/19/2016   Procedure: ESOPHAGOGASTRODUODENOSCOPY (EGD) WITH PROPOFOL;  Surgeon: Danie Binder, MD;  [facility];  Service: Endoscopy;  Laterality: N/A;  11:30am  . FLEXIBLE SIGMOIDOSCOPY N/A 12/10/2015   hemorrhoid banding X 3   . HEMORRHOID BANDING N/A 12/10/2015   Procedure: HEMORRHOID BANDING;  Surgeon: Danie Binder, MD;  [facility];  Service: Endoscopy;  Laterality: N/A;  1:30 PM  . IR CM INJ ANY COLONIC TUBE W/FLUORO  10/30/2018  . IR GASTROSTOMY TUBE MOD SED  10/04/2018  . IR IMAGING GUIDED PORT INSERTION  10/04/2018  . IR  REMOVAL TUN ACCESS W/ PORT W/O FL MOD SED  10/14/2018  . IR REPLACE G-TUBE SIMPLE WO FLUORO  11/03/2018  . LAPAROSCOPIC INSERTION GASTROSTOMY TUBE Left 10/07/2018   Procedure: LAPAROSCOPIC  GASTROSTOMY TUBE;  Surgeon: Kinsinger, Arta Bruce, MD;  Location: [facility];  Service: General;  Laterality: Left;  Marland Kitchen MICROLARYNGOSCOPY N/A 09/27/2018   Procedure: MICRO DIRECT LARYNGOSCOPY WITH BIOPSY;  Surgeon: Leta Baptist, MD;  [facility]: [facility] OR;  Service: ENT;  Laterality: N/A;  . None to date     As of 04/14/15  . POLYPECTOMY  05/04/2015   Procedure: POLYPECTOMY;  Surgeon: Danie Binder, MD;  [facility];  Service: Endoscopy;;  descending colon polyp, ascending colon polyp  . SAVORY DILATION N/A 12/19/2016   Procedure: SAVORY DILATION;  Surgeon: Danie Binder, MD;  [facility];  Service: Endoscopy;  Laterality: N/A;  . TRACHEOSTOMY TUBE PLACEMENT N/A 09/27/2018   Procedure: AWAKE TRACHEOSTOMY;  Surgeon: Leta Baptist, MD;  [facility];  Service: ENT;  Laterality: N/A;    There were no vitals filed for this visit.     SPEECH THERAPY DISCHARGE SUMMARY  Visits from Start of Care: 3 (Eval + two therapy visits)  Current functional level related to goals / functional outcomes: Pt had made gains in his ability to manage secretions over the therapy course. It is believed he was completing his swallow exercises. Upon entering the therapy  positive  serological test for hepatitis C 12/13/2016  . Globus sensation 12/12/2016  . Smokes with greater than 40 pack year history 10/20/2016  . Hemorrhoids, internal, with bleeding 10/06/2015  . History of adenomatous polyp of colon 04/14/2015  . Vitamin D deficiency 10/09/2014  . HLD (hyperlipidemia) 10/08/2012  . GERD (gastroesophageal reflux disease) 10/08/2012  . HTN (hypertension)   . Diverticulitis 10/25/2009    [facility] ,[place], CCC-SLP  02/13/2019, 4:18 PM  [facility] [address] [place], [name], [address] Phone: [phone]   Fax:  [phone]   Name: Logan Price MRN: [id number] Date of Birth: July 06, 1945

## 2019-02-17 NOTE — Patient Instructions (Addendum)
DUE TO COVID-19 ONLY ONE VISITOR IS ALLOWED TO COME WITH YOU AND STAY IN THE WAITING ROOM ONLY DURING PRE OP AND PROCEDURE DAY OF SURGERY. THE 1 VISITOR MAY VISIT WITH YOU AFTER SURGERY IN YOUR PRIVATE ROOM DURING VISITING HOURS ONLY!  YOU NEED TO HAVE A COVID 19 TEST ON__11/27____ @_9 :25______, THIS TEST MUST BE DONE BEFORE SURGERY, COME  [address], [place] , [address].  ([place]) ONCE YOUR COVID TEST IS COMPLETED, PLEASE BEGIN THE QUARANTINE INSTRUCTIONS AS OUTLINED IN YOUR HANDOUT.                Logan Price    Your procedure is scheduled on: 02/24/19   Report to [facility] Main  Entrance   Report to admitting at  6:15 AM     Call this number if you have problems the morning of surgery [phone]    Remember: Do not eat food or drink liquids :After Midnight.   BRUSH YOUR TEETH MORNING OF SURGERY AND RINSE YOUR MOUTH OUT, NO CHEWING GUM CANDY OR MINTS.     Take these medicines the morning of surgery with A SIP OF WATER: Protonix                                 You may not have any metal on your body including piercings               Do not wear jewelry, lotions, powders or deodorant                       Men may shave face and neck.   Do not bring valuables to the hospital. [place].  Contacts, dentures or bridgework may not be worn into surgery.       P Special Instructions: N/A              Please read over the following fact sheets you were given: _____________________________________________________________________             [facility] - Preparing for Surgery  Before surgery, you can play an important role.   Because skin is not sterile, your skin needs to be as free of germs as possible.   You can reduce the number of germs on your skin by washing with CHG (chlorahexidine gluconate) soap before surgery.   CHG is an antiseptic cleaner which kills germs and bonds with  the skin to continue killing germs even after washing. Please DO NOT use if you have an allergy to CHG or antibacterial soaps.   If your skin becomes reddened/irritated stop using the CHG and inform your nurse when you arrive at Short Stay. .  You may shave your face/neck.  Please follow these instructions carefully:  1.  Shower with CHG Soap the night before surgery and the  morning of Surgery.  2.  If you choose to wash your hair, wash your hair first as usual with your  normal  shampoo.  3.  After you shampoo, rinse your hair and body thoroughly to remove the  shampoo.  4.  Use CHG as you would any other liquid soap.  You can apply chg directly  to the skin and wash                       Gently with a scrungie or clean washcloth.  5.  Apply the CHG Soap to your body ONLY FROM THE NECK DOWN.   Do not use on face/ open                           Wound or open sores. Avoid contact with eyes, ears mouth and genitals (private parts).                       Wash face,  Genitals (private parts) with your normal soap.             6.  Wash thoroughly, paying special attention to the area where your surgery  will be performed.  7.  Thoroughly rinse your body with warm water from the neck down.  8.  DO NOT shower/wash with your normal soap after using and rinsing off  the CHG Soap.             9.  Pat yourself dry with a clean towel.            10.  Wear clean pajamas.            11.  Place clean sheets on your bed the night of your first shower and do not  sleep with pets. Day of Surgery : Do not apply any lotions/deodorants the morning of surgery.  Please wear clean clothes to the hospital/surgery center.  FAILURE TO FOLLOW THESE INSTRUCTIONS MAY RESULT IN THE CANCELLATION OF YOUR SURGERY PATIENT SIGNATURE_________________________________  NURSE  SIGNATURE__________________________________  ________________________________________________________________________

## 2019-02-18 ENCOUNTER — Encounter (HOSPITAL_COMMUNITY)
Admission: RE | Admit: 2019-02-18 | Discharge: 2019-02-18 | Disposition: A | Discharge status: Home / Self Care | Payer: Medicare Other | Source: Ambulatory Visit | Attending: Urology | Admitting: Urology

## 2019-02-18 ENCOUNTER — Other Ambulatory Visit: Payer: Self-pay | Admitting: Family

## 2019-02-18 ENCOUNTER — Encounter (HOSPITAL_COMMUNITY): Payer: Self-pay

## 2019-02-18 ENCOUNTER — Other Ambulatory Visit: Payer: Self-pay

## 2019-02-18 DIAGNOSIS — Z85818 Personal history of malignant neoplasm of other sites of lip, oral cavity, and pharynx: Secondary | ICD-10-CM | POA: Insufficient documentation

## 2019-02-18 DIAGNOSIS — Z8521 Personal history of malignant neoplasm of larynx: Secondary | ICD-10-CM | POA: Diagnosis not present

## 2019-02-18 DIAGNOSIS — E785 Hyperlipidemia, unspecified: Secondary | ICD-10-CM | POA: Diagnosis not present

## 2019-02-18 DIAGNOSIS — Z87891 Personal history of nicotine dependence: Secondary | ICD-10-CM | POA: Diagnosis not present

## 2019-02-18 DIAGNOSIS — Z01812 Encounter for preprocedural laboratory examination: Secondary | ICD-10-CM | POA: Insufficient documentation

## 2019-02-18 DIAGNOSIS — K219 Gastro-esophageal reflux disease without esophagitis: Secondary | ICD-10-CM | POA: Diagnosis not present

## 2019-02-18 DIAGNOSIS — Z79899 Other long term (current) drug therapy: Secondary | ICD-10-CM | POA: Insufficient documentation

## 2019-02-18 DIAGNOSIS — R338 Other retention of urine: Secondary | ICD-10-CM | POA: Insufficient documentation

## 2019-02-18 DIAGNOSIS — N401 Enlarged prostate with lower urinary tract symptoms: Secondary | ICD-10-CM | POA: Insufficient documentation

## 2019-02-18 DIAGNOSIS — R4702 Dysphasia: Secondary | ICD-10-CM

## 2019-02-18 DIAGNOSIS — I10 Essential (primary) hypertension: Secondary | ICD-10-CM | POA: Diagnosis not present

## 2019-02-18 LAB — COMPREHENSIVE METABOLIC PANEL
ALT: 10 U/L (ref 0–44)
AST: 15 U/L (ref 15–41)
Albumin: 3.7 g/dL (ref 3.5–5.0)
Alkaline Phosphatase: 48 U/L (ref 38–126)
Anion gap: 8 (ref 5–15)
BUN: 29 mg/dL — ABNORMAL HIGH (ref 8–23)
CO2: 28 mmol/L (ref 22–32)
Calcium: 9.2 mg/dL (ref 8.9–10.3)
Chloride: 104 mmol/L (ref 98–111)
Creatinine, Ser: 0.83 mg/dL (ref 0.61–1.24)
GFR calc Af Amer: >60 mL/min (ref >60)
GFR calc non Af Amer: >60 mL/min (ref >60)
Glucose, Bld: 112 mg/dL — ABNORMAL HIGH (ref 70–99)
Potassium: 4.5 mmol/L (ref 3.5–5.1)
Sodium: 140 mmol/L (ref 135–145)
Total Bilirubin: 0.6 mg/dL (ref 0.3–1.2)
Total Protein: 7.2 g/dL (ref 6.5–8.1)

## 2019-02-18 LAB — CBC
HCT: 39.6 % (ref 39.0–52.0)
Hemoglobin: 12.1 g/dL — ABNORMAL LOW (ref 13.0–17.0)
MCH: 27.9 pg (ref 26.0–34.0)
MCHC: 30.6 g/dL (ref 30.0–36.0)
MCV: 91.5 fL (ref 80.0–100.0)
Platelets: 263 10*3/uL (ref 150–400)
RBC: 4.33 MIL/uL (ref 4.22–5.81)
RDW: 14.6 % (ref 11.5–15.5)
WBC: 6.2 10*3/uL (ref 4.0–10.5)
nRBC: 0 % (ref 0.0–0.2)

## 2019-02-18 NOTE — Progress Notes (Signed)
Barium Swallow study placed

## 2019-02-18 NOTE — Progress Notes (Signed)
PCP - Evelina Dun Cardiologist -Dr. Bronson Ing  Chest x-ray - 10/19/18 EKG - 10/13/18 Stress Test - no ECHO - 10/03/18 Cardiac Cath - no  Sleep Study - NA CPAP -   Fasting Blood Sugar - NA Checks Blood Sugar _____ times a day  Blood Thinner Instructions:  NA Aspirin Instructions: Last Dose:  Anesthesia review:   Patient denies shortness of breath, fever, cough and chest pain at PAT appointment yes  Patient verbalized understanding of instructions that were given to them at the PAT appointment. Patient was also instructed that they will need to review over the PAT instructions again at home before surgery. Yes Pt manages secretions from [place] with portable suction. He ambulates well with a walker for stability and to carry the suction machine. He has a G-tube used for feeding. Dressing is clean dry and intact.

## 2019-02-19 ENCOUNTER — Telehealth: Payer: Self-pay | Admitting: Family

## 2019-02-19 NOTE — Progress Notes (Signed)
Anesthesia Chart Review   Case: [id number] Date/Time: 02/24/19 0830   Procedures:      TRANSURETHRAL RESECTION OF THE PROSTATE (TURP) (N/A )     INSERTION OF SUPRAPUBIC CATHETER (N/A )   Anesthesia type: General   Pre-op diagnosis: BENIGN PROSTATIC HYPERPLASIA WITH RETENTION   Location: [facility]OM 03 / [facility]   Surgeon: Kathie Rhodes, MD      DISCUSSION:72 y.o. 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 Kathie Rhodes.   H/o throat cancer s/p trach, biopsy (09/27/2018), and G-tube placement (10/07/2018).  Paroxysmal SVT during hospital admission which was terminated with adenosine.  Seen by cardiologist, Dr. Buford Dresser, during admission.  Per note, "ECGs consistent with pSVT, likely rate related aberrancy as well -TSH normal, monitoring K/Mg -on metoprolol suspension 25 mg QID. Would continue, there is not a suspension of long acting succinate that I know of.  -reviewed telemetry and ECGs. No clear fib/flutter that would require anticoagulation. Appears to be atrial tachycardia given clear on/offset -likely exacerbated by infection"  Per discharge instructions pt to follow up with cardiologist, Dr. Bronson Ing.  He has not had this follow up at this time.    Nuclear stress test 12/2016, low risk study. Echo 10/03/2018 with EF 60-65%, valves ok.   Pt continues with speech therapy.  Pt only swallowing ice chips per instruction from speech therapy.  Last speech therapy notes document progress.  He is scheduled to follow up with Dr. Isidore Moos for restaging PET.   Discussed with Dr. Doroteo Glassman.  Anticipate pt can proceed with planned procedure barring acute status change.   VS: BP (!) 147/66    Pulse 98   Temp 37 C (Oral)   Resp 18   Ht 5\' 9"  (1.753 m)   Wt 62.8 kg   SpO2 95%   BMI 20.45 kg/m   PROVIDERS: Sharion Balloon, FNP  Eppie Gibson, MD is Oncologist  LABS: Labs reviewed: Acceptable for surgery. (all labs ordered are listed, but only abnormal results are displayed)  Labs Reviewed  COMPREHENSIVE METABOLIC PANEL - Abnormal; Notable for the following components:      Result Value   Glucose, Bld 112 (*)    BUN 29 (*)    All other components within normal limits  CBC - Abnormal; Notable for the following components:   Hemoglobin 12.1 (*)    All other components within normal limits     IMAGES:   EKG: 10/13/2018 Rate 105 bpm Sinus tachycardia with 1st degree A-V block with occasional and consecutive Premature ventricular complexes Septal infarct , age undetermined Since previous tracing 10/13/18 11:41 unidentified SVT now S. Tach with ectopy.  CV: Echo 10/03/2018 IMPRESSIONS   1. The left ventricle has normal systolic function with an ejection fraction of 60-65%. The cavity size was normal. Left ventricular diastolic parameters were normal.  2. The right ventricle has normal systolic function. The cavity was normal. There is no increase in right ventricular wall thickness. Right ventricular systolic pressure could not be assessed.  3. Small pericardial effusion.  4. The pericardial effusion is anterior to the right ventricle.  5. The mitral valve is grossly normal. No evidence of mitral valve stenosis.  6. The aortic valve is tricuspid. No stenosis  of the aortic valve.  7. The aortic root and descending aorta are normal in size and structure.  Lexiscan Myoview 01/08/2017  No diagnostic ST segment changes to indicate ischemia. GXT was converted to Lexiscan due to fatigue and inadequate heart rate response.  Blood pressure demonstrated a hypertensive response to exercise.  Small, moderate intensity, mid to apical inferior defect that is  predominantly fixed with partial reversibility noted towards the apex. Suggestive of scar with mild peri-infarct ischemia, although normal wall motion on gated imaging.  This is a low risk study.  Nuclear stress EF: 58%. Past Medical History:  Diagnosis Date  . Cancer ([place]) 08/2018   trach  . Diverticulitis   . Dysrhythmia 09/2018   episode of SVT while in hospital  . 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 GERD (gastroesophageal reflux disease)   . HTN (hypertension)   . Hyperlipidemia     Past Surgical History:  Procedure Laterality Date  . BIOPSY  05/04/2015   Procedure: BIOPSY;  Surgeon: Danie Binder, MD;  [facility];  Service: Endoscopy;;  bx's of ileocecal valve   . COLONOSCOPY WITH PROPOFOL N/A 05/04/2015   Dr. Oneida Alar: normal appearing ileum with prominent IC valve with tubular adenomas, moderate diverticulosis in sigmoid colon, ascending colon, and retum. Moderate sized internal hemorrhoids. Surveillance in 5 years  . ESOPHAGOGASTRODUODENOSCOPY (EGD) WITH PROPOFOL N/A 12/19/2016   Procedure: ESOPHAGOGASTRODUODENOSCOPY (EGD) WITH PROPOFOL;  Surgeon: Danie Binder, MD;  [facility];  Service: Endoscopy;  Laterality: N/A;  11:30am  . FLEXIBLE SIGMOIDOSCOPY N/A 12/10/2015   hemorrhoid banding X 3   . HEMORRHOID BANDING N/A 12/10/2015   Procedure: HEMORRHOID BANDING;  Surgeon: Danie Binder, MD;  [facility];  Service: Endoscopy;  Laterality: N/A;  1:30 PM  . IR CM INJ ANY COLONIC TUBE W/FLUORO  10/30/2018  . IR GASTROSTOMY TUBE MOD SED  10/04/2018  . IR IMAGING GUIDED PORT INSERTION  10/04/2018  . IR REMOVAL TUN ACCESS W/ PORT W/O FL MOD SED  10/14/2018  . IR REPLACE G-TUBE SIMPLE WO FLUORO  11/03/2018  . LAPAROSCOPIC INSERTION GASTROSTOMY TUBE Left 10/07/2018   Procedure: LAPAROSCOPIC  GASTROSTOMY TUBE;  Surgeon: Kinsinger, Arta Bruce, MD;  Location: [place];  Service: General;  Laterality: Left;  Marland Kitchen MICROLARYNGOSCOPY N/A  09/27/2018   Procedure: MICRO DIRECT LARYNGOSCOPY WITH BIOPSY;  Surgeon: Leta Baptist, MD;  Location: [facility];  Service: ENT;  Laterality: N/A;  . None to date     As of 04/14/15  . POLYPECTOMY  05/04/2015   Procedure: POLYPECTOMY;  Surgeon: Danie Binder, MD;  [facility];  Service: Endoscopy;;  descending colon polyp, ascending colon polyp  . SAVORY DILATION N/A 12/19/2016   Procedure: SAVORY DILATION;  Surgeon: Danie Binder, MD;  [facility];  Service: Endoscopy;  Laterality: N/A;  . TRACHEOSTOMY TUBE PLACEMENT N/A 09/27/2018   Procedure: AWAKE TRACHEOSTOMY;  Surgeon: Leta Baptist, MD;  Location: [facility];  Service: ENT;  Laterality: N/A;    MEDICATIONS: . Amino Acids-Protein Hydrolys (FEEDING SUPPLEMENT, PRO-STAT SUGAR FREE 64,) LIQD  . doxazosin (CARDURA) 1 MG tablet  . fluticasone (FLONASE) 50 MCG/ACT nasal spray  . hydrocortisone (ANUSOL-HC) 2.5 % rectal cream  . ipratropium-albuterol (DUONEB) 0.5-2.5 (3) MG/3ML SOLN  . loperamide HCl (IMODIUM) 1 MG/7.5ML suspension  . metoprolol tartrate (LOPRESSOR) 25 mg/10 mL SUSP  . Nutritional Supplements (KATE FARMS PEPTIDE 1.5) LIQD  . pantoprazole (PROTONIX) 40 MG tablet  .  scopolamine (TRANSDERM-SCOP) 1 MG/3DAYS  . simvastatin (ZOCOR) 20 MG tablet  . Water For Irrigation, Sterile (FREE WATER) SOLN   No current facility-administered medications for this encounter.     Maia Plan [facility] Pre-Surgical Testing [phone] 02/19/19  11:05 AM

## 2019-02-19 NOTE — Telephone Encounter (Signed)
FYI

## 2019-02-19 NOTE — Anesthesia Preprocedure Evaluation (Addendum)
Anesthesia Evaluation  Patient identified by MRN, date of birth, ID band Patient awake    Reviewed: Allergy & Precautions, NPO status , Patient's Chart, lab work & pertinent test results, reviewed documented beta blocker date and time   History of Anesthesia Complications Negative for: history of anesthetic complications  Airway Mallampati: Trach  TM Distance: >3 FB Neck ROM: Full    Dental no notable dental hx.    Pulmonary former smoker,  Glottic ca s/p tracheostomy 09/27/18   Pulmonary exam normal        Cardiovascular hypertension, Pt. on home beta blockers and Pt. on medications Normal cardiovascular exam+ dysrhythmias Atrial Fibrillation and Supra Ventricular Tachycardia   Echo 10/03/2018: EF 60-65%, normal valves Nuclear stress 01/08/2017: low risk study    Neuro/Psych negative neurological ROS  negative psych ROS   GI/Hepatic Neg liver ROS, GERD  Medicated and Controlled,  Endo/Other  negative endocrine ROS  Renal/GU negative Renal ROS   BPH    Musculoskeletal negative musculoskeletal ROS (+)   Abdominal   Peds  Hematology  (+) anemia , Hgb 12.1   Anesthesia Other Findings Day of surgery medications reviewed with patient.  Reproductive/Obstetrics negative OB ROS                           Anesthesia Physical Anesthesia Plan  ASA: III  Anesthesia Plan: General   Post-op Pain Management:    Induction: Inhalational  PONV Risk Score and Plan: 3 and Treatment may vary due to age or medical condition, Ondansetron, Dexamethasone and Midazolam  Airway Management Planned: Tracheostomy  Additional Equipment: None  Intra-op Plan:   Post-operative Plan:   Informed Consent: I have reviewed the patients History and Physical, chart, labs and discussed the procedure including the risks, benefits and alternatives for the proposed anesthesia with the patient or authorized representative  who has indicated his/her understanding and acceptance.     Dental advisory given  Plan Discussed with: CRNA  Anesthesia Plan Comments: (See PAT note 02/18/2019, Konrad Felix, PA-C)      Anesthesia Quick Evaluation

## 2019-02-21 ENCOUNTER — Other Ambulatory Visit (HOSPITAL_COMMUNITY)
Admission: RE | Admit: 2019-02-21 | Discharge: 2019-02-21 | Disposition: A | Discharge status: Home / Self Care | Payer: Medicare Other | Source: Ambulatory Visit | Attending: Urology | Admitting: Urology

## 2019-02-21 DIAGNOSIS — Z01812 Encounter for preprocedural laboratory examination: Secondary | ICD-10-CM | POA: Insufficient documentation

## 2019-02-21 DIAGNOSIS — Z20828 Contact with and (suspected) exposure to other viral communicable diseases: Secondary | ICD-10-CM | POA: Insufficient documentation

## 2019-02-21 LAB — SARS CORONAVIRUS 2 (TAT 6-24 HRS): SARS Coronavirus 2: NEGATIVE

## 2019-02-23 NOTE — Discharge Instructions (Signed)
Post transurethral resection of the prostate (TURP) instructions  Your recent prostate surgery requires very special post hospital care. Despite the fact that no skin incisions were used the area around the prostate incision is quite raw and is covered with a scab to promote healing and prevent bleeding. Certain cautions are needed to assure that the scab is not disturbed of the next 2-3 weeks while the healing proceeds.  Because the raw surface in your prostate and the irritating effects of urine you may expect frequency of urination and/or urgency (a stronger desire to urinate) and perhaps even getting up at night more often. This will usually resolve or improve slowly over the healing period. You may see some blood in your urine over the first 6 weeks. Do not be alarmed, even if the urine was clear for a while. Get off your feet and drink lots of fluids until clearing occurs. If you start to pass clots or don't improve call us.  Catheter: (If you are discharged with a catheter.) 1. Keep your catheter secured to your leg at all times with tape or the supplied strap. 2. You may experience leakage of urine around your catheter- as long as the  catheter continues to drain, this is normal.  If your catheter stops draining  go to the ER. 3. You may also have blood in your urine, even after it has been clear for  several days; you may even pass some small blood clots or other material.  This  is normal as well.  If this happens, sit down and drink plenty of water to help  make urine to flush out your bladder.  If the blood in your urine becomes worse  after doing this, contact our office or return to the ER. 4. You may use the leg bag (small bag) during the day, but use the large bag at  night.  Diet:  You may return to your normal diet immediately. Because of the raw surface of your bladder, alcohol, spicy foods, foods high in acid and drinks with caffeine may cause irritation or frequency and  should be used in moderation. To keep your urine flowing freely and avoid constipation, drink plenty of fluids during the day (8-10 glasses). Tip: Avoid cranberry juice because it is very acidic.  Activity:  Your physical activity doesn't need to be restricted. However, if you are very active, you may see some blood in the urine. We suggest that you reduce your activity under the circumstances until the bleeding has stopped.  Bowels:  It is important to keep your bowels regular during the postoperative period. Straining with bowel movements can cause bleeding. A bowel movement every other day is reasonable. Use a mild laxative if needed, such as milk of magnesia 2-3 tablespoons, or 2 Dulcolax tablets. Call if you continue to have problems. If you had been taking narcotics for pain, before, during or after your surgery, you may be constipated. Take a laxative if necessary.  Medication:  You should resume your pre-surgery medications unless told not to. DO NOT RESUME YOUR ASPIRIN, WARFARIN, OR OTHER BLOOD THINNER FOR 1 WEEK. In addition you may be given an antibiotic to prevent or treat infection. Antibiotics are not always necessary. All medication should be taken as prescribed until the bottles are finished unless you are having an unusual reaction to one of the drugs.     Problems you should report to [place]:  a. Fever greater than 101F. b. Heavy bleeding, or clots (see  notes above about blood in urine). c. Inability to urinate. d. Drug reactions (hives, rash, nausea, vomiting, diarrhea). e. Severe burning or pain with urination that is not improving.  Suprapubic Catheter Home Guide A suprapubic catheter is a flexible tube that is used to drain urine from the bladder into a collection bag outside the body. The catheter is inserted into the bladder through a small opening in the lower abdomen, above the pubic bone (suprapubic area) and a few inches below your belly button (navel). A tiny  balloon filled with germ-free (sterile) water helps to keep the catheter in place. The collection bag must be emptied at least once a day and cleaned at least every other day. The collection bag can be put beside your bed at night and attached to your leg during the day. You may have a large collection bag to use at night and a smaller one to use during the day. Your suprapubic catheter may need to be changed every 4-6 weeks, or as often as recommended by your health care provider. Healing of the tract where the catheter is placed can take 6 weeks to 6 months. During that time, your health care provider may change your catheter. Once the tract is well healed, you or a caregiver will change your suprapubic catheter at home. What are the risks? This catheter is safe to use. However, problems can occur, including:  Blocked urine flow. This can occur if the catheter stops working, or if you have a blood clot in your bladder or in the catheter.  Irritation of the skin around the catheter.  Infection. This can happen if bacteria gets into your bladder. Supplies needed:  Two pairs of sterile gloves.  Paper towels.  Catheter.  Two syringes.  Sterile water.  Sterile cleaning solution.  Lubricant.  Collection bags. How to change the catheter  1. Drink plenty of fluids during the hours before you change the catheter. 2. Wash your hands with soap and water. If soap and water are not available, use hand sanitizer. 3. Draw up sterile water into a syringe to have ready to fill the new catheter balloon. The amount will depend on the size of the balloon. 4. Have all of your supplies ready and close to you on a paper towel. 5. Lie on your back, sitting slightly upright so that you can see the catheter and opening. 6. Put on sterile gloves. 7. Clean the skin around the catheter opening using the sterile cleaning solution. 8. Remove the water from the balloon in the catheter using a  syringe. 9. Slowly remove the catheter. If the catheter seems stuck, or if you have difficulty removing it: ? Do not pull on it. ? Call your health care provider right away. 10. Place the old catheter on a paper towel to discard later. 11. Take off the used gloves, and put on a new pair. 12. Put lubricant on the end of the new catheter that will go into your bladder. 13. Clean the skin around the catheter opening using the sterile cleaning solution. 14. Gently slide the catheter through the opening in your abdomen and into the tract that leads to your bladder. 15. Wait for some urine to start flowing through the catheter. 16. When urine starts to flow through the catheter, attach the collection bag to the end of the catheter. Make sure the connection is tight. 17. Use a syringe to fill the catheter balloon with sterile water. Fill to the amount directed by your  health care provider. 18. Remove the gloves and wash your hands with soap and water. How to care for the skin around the catheter Follow your health care provider's instructions on caring for your skin.  Use a clean washcloth and soapy water to clean the skin around your catheter every day. Pat the area dry with a clean paper towel.  Do not pull on the catheter.  Do not use ointment or lotion on this area, unless told by your health care provider.  Check the skin around the catheter every day for signs of infection. Check for: ? Redness, swelling, or pain. ? Fluid or blood. ? Warmth. ? Pus or a bad smell. How to empty and clean the collection bag Empty the large collection bag every 8 hours. Empty the small collection bag when it is about ? full. Clean the collection bag every 2-3 days, or as often as told by your health care provider. To do this: 1. Wash your hands with soap and water. If soap and water are not available, use hand sanitizer. 2. Disconnect the bag from the catheter and immediately attach a new bag to the  catheter. 3. Hold the used bag over the toilet or another container. 4. Turn the valve (spigot) at the bottom of the bag to empty the urine. Empty the used bag completely. ? Do not touch the opening of the spigot. ? Do not let the opening touch the toilet or container. 5. Close the spigot tightly when the bag is empty. 6. Clean the used bag in one of the following methods: ? According to the manufacturer's instructions. ? As told by your health care provider. 7. Let the bag dry completely. Put it in a clean plastic bag before storing it. General tips   Always wash your hands before and after caring for your catheter and collection bag. Use a mild, fragrance-free soap. If soap and water are not available, use hand sanitizer.  Clean the outside of the catheter with soap and water as often as told by your health care provider.  Always make sure there are no twists or kinks in the catheter tube.  Always make sure there are no leaks in the catheter or collection bag.  Always wear the leg bag below your knee.  Make sure the overnight drainage bag is always lower than the level of your bladder, but do not let it touch the floor. Before you go to sleep, hang the bag inside a wastebasket that is covered by a clean plastic bag.  Drink enough fluid to keep your urine pale yellow.  Do not take baths, swim, or use a hot tub until your health care provider approves. Ask your health care provider if you may take showers. Contact a heath care provider if:  You leak urine.  You have redness, swelling, or pain around your catheter.  You have fluid or blood coming from your catheter opening.  Your catheter opening feels warm to the touch.  You have pus or a bad smell coming from your catheter opening.  You have a fever or chills.  Your urine flow slows down.  Your urine becomes cloudy or smelly. Get help right away if:  Your catheter comes out.  You have: ? Nausea. ? Back  pain. ? Difficulty changing your catheter. ? Blood in your urine. ? No urine flow for 1 hour. Summary  A suprapubic catheter is a flexible tube that is used to drain urine from the bladder into a  collection bag outside the body.  Your suprapubic catheter may need to be changed every 4-6 weeks, or as recommended by your health care provider.  Follow instructions on how to change the catheter and how to empty and clean the collection bag.  Always wash your hands before and after caring for your catheter and collection bag. Drink enough fluid to keep your urine pale yellow.  Get help right away if you have difficulty changing your catheter or if there is blood in your urine. This information is not intended to replace advice given to you by your health care provider. Make sure you discuss any questions you have with your health care provider. Document Released: 11/29/2010 Document Revised: 07/04/2018 Document Reviewed: 04/17/2018 Elsevier Patient Education  2020 [employer].

## 2019-02-23 NOTE — H&P (Signed)
HPI: Logan Price is a 73 year-old male with BPH and urinary retention.  His problem was discovered 10/02/2018. His current symptoms did begin after he had a surgical procedure. He currently has an indwelling catheter. The catheter has been in his bladder for 10/02/2018. His urinary retention is being treated with foley catheter.   His urine has shut off completely.   He has not previously had an indwelling catheter in for more than two weeks at a time. This condition would be considered of mild to moderate severity with no modifying factors or associated signs or symptoms other than as noted above.   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 During his hospitalization he was having difficulty urinating requiring multiple in and out catheterizations and eventually had a Foley catheter placed which has been present since that time and appears that it is not been changed. He had been taking Cardura 1 mg. During his hospitalization he was also placed on Flomax but he tells me he was not continued on this upon discharge.  Before being admitted to the hospital he said he really did not have any voiding symptoms at all. He said he was able to urinate with a strong, forceful stream and only got up once at night. He has no prior history of prostatitis or UTIs and has never been in urinary retention in the past.   12/26/18: He returns today for repeat voiding trial. He failed his previous voiding trial but had not been placed on alpha-blocker other than a single mg of Cardura which he had been on chronically. Although I prescribed tamsulosin when he got the medication and tried to introduce it into his feeding tube it would go in to solution and therefore he could take the medicine.   01/02/19: The patient returns today for a voiding trial. At his last visit I opened a Rapaflo capsule and dissolved in water  demonstrating that this could be then administered through his PEG feeding tube. He was started on the Rapaflo and returns today for repeat voiding trial. I had a bottle of 7 Rapaflo pills that I gave to him and sent in a prescription but once again his insurance would not cover this medication despite the fact that it is 1 that he can take through his PEG tube. He was unable to introduce the generic tamsulosin through the tube.     ALLERGIES: Nkda    MEDICATIONS: Cardura 1 mg tablet  Metoprolol Tartrate 50 mg tablet  Rapaflo 8 mg capsule 1 capsule PO Q PM Take with your evening meal  Acetaminophen  Amino Acid  Bupivacaine Hcl  Cholecalciferol  Dexamethasone  Ephedrine Hcl  Fentanyl  Flonase Allergy Relief  Hydrocortisone 0.5 % cream  Ipratropium-Albuterol  Lidocaine  Loperamide  Nutritional Suppliment  Pantoprazole Sodium  Phenylephrine Hcl  Rocuronium Bromide  Scopolamine  Succinylcholine Chloride  Zocor     GU PSH: Complex cystometrogram, w/ void pressure and urethral pressure profile studies, any technique - 01/21/2019 Complex Uroflow - 01/21/2019 Emg surf Electrd - 01/21/2019 Inject For cystogram - 01/21/2019 Intrabd voidng Press - 01/21/2019       PSH Notes: feeding tube placement  Tracheotomy    NON-GU PSH: None   GU PMH: Urinary Retention (Stable), He failed his voiding trial today. I offered to drain his bladder and let him consume fluids throughout the day to see if he voids spontaneously but he  did not want to consider doing that or self catheterization. I have discussed the evaluating his bladder further for adequate detrusor contractility to determine if on outlet resistance procedure would benefit him. I discussed the procedure with him. He understands and has elected to proceed. - 01/02/2019, - 12/26/2018, He failed his voiding trial today. He was not maintained on tamsulosin upon discharge and therefore started this at a 0.8 mg dose. He will then return for  repeat voiding trial in 2 weeks., - 12/11/2018 BPH w/LUTS - 12/26/2018      PMH Notes: patient had throat cancer   NON-GU PMH: None   FAMILY HISTORY: 2 daughters - Daughter 2 sons - Son   SOCIAL HISTORY: Marital Status: Single Preferred Language: English; Ethnicity: Not Hispanic Or Latino; Race: Black or African American Current Smoking Status: Patient has never smoked.   Tobacco Use Assessment Completed: Used Tobacco in last 30 days? Social Drinker.  Does not drink caffeine.    REVIEW OF SYSTEMS:    GU Review Male:   Patient denies stream starts and stops, get up at night to urinate, hard to postpone urination, penile pain, erection problems, burning/ pain with urination, leakage of urine, trouble starting your stream, frequent urination, and have to strain to urinate .  Gastrointestinal (Upper):   Patient denies nausea, vomiting, and indigestion/ heartburn.  Gastrointestinal (Lower):   Patient denies diarrhea and constipation.  Constitutional:   Patient denies fever, night sweats, weight loss, and fatigue.  Skin:   Patient denies skin rash/ lesion and itching.  Eyes:   Patient denies blurred vision and double vision.  Ears/ Nose/ Throat:   Patient denies sore throat and sinus problems.  Hematologic/Lymphatic:   Patient denies swollen glands and easy bruising.  Cardiovascular:   Patient denies leg swelling and chest pains.  Respiratory:   Patient denies cough and shortness of breath.  Endocrine:   Patient denies excessive thirst.  Musculoskeletal:   Patient denies back pain and joint pain.  Neurological:   Patient denies headaches and dizziness.  Psychologic:   Patient denies depression and anxiety.   VITAL SIGNS:      01/28/2019 10:12 AM  Weight 138 lb / 62.6 kg  Height 69 in / 175.26 cm  BP 124/73 mmHg  Pulse 102 /min  BMI 20.4 kg/m   GU PHYSICAL EXAMINATION:    Anus and Perineum: No hemorrhoids. No anal stenosis. No rectal fissure, no anal fissure. No edema, no dimple,  no perineal tenderness, no anal tenderness.  Scrotum: No lesions. No edema. No cysts. No warts.  Epididymides: Right: no spermatocele, no masses, no cysts, no tenderness, no induration, no enlargement. Left: no spermatocele, no masses, no cysts, no tenderness, no induration, no enlargement.  Testes: No tenderness, no swelling, no enlargement left testes. No tenderness, no swelling, no enlargement right testes. Normal location left testes. Normal location right testes. No mass, no cyst, no varicocele, no hydrocele left testes. No mass, no cyst, no varicocele, no hydrocele right testes.  Urethral Meatus: Normal size. No lesion, no wart, no discharge, no polyp. Normal location.  Penis: Penile foley catheter present. Circumcised, no foreskin warts, no cracks. No dorsal peyronie's plaques, no left corporal peyronie's plaques, no right corporal peyronie's plaques, no scarring, no shaft warts. No balanitis, no meatal stenosis.   Prostate: 40 gram or 2+ size. Left lobe normal consistency, right lobe normal consistency. Symmetrical lobes. No prostate nodule. Left lobe no tenderness, right lobe no tenderness.   Seminal Vesicles: Nonpalpable.  Sphincter  Tone: Normal sphincter. No rectal tenderness. No rectal mass.    MULTI-SYSTEM PHYSICAL EXAMINATION:    Constitutional: Thin. No physical deformities. Normally developed. Good grooming.   Neck: Neck symmetrical, not swollen. Normal tracheal position with a tracheostomy tube in place.   Respiratory: No labored breathing, no use of accessory muscles.   Cardiovascular: Normal temperature, normal extremity pulses, no swelling, no varicosities.  Lymphatic: No enlargement of neck, axillae, groin.  Skin: No paleness, no jaundice, no cyanosis. No lesion, no ulcer, no rash.  Neurologic / Psychiatric: Oriented to time, oriented to place, oriented to person. No depression, no anxiety, no agitation.  Gastrointestinal: No mass, no tenderness, no rigidity, non obese abdomen.  Peg tube in left upper quadrant.  Eyes: Normal conjunctivae. Normal eyelids.  Ears, Nose, Mouth, and Throat: Left ear no scars, no lesions, no masses. Right ear no scars, no lesions, no masses. Nose no scars, no lesions, no masses. Normal hearing. Normal lips.  Musculoskeletal: Normal gait and station of head and neck.   PAST DATA REVIEWED:  Source Of History:  Patient  Records Review:   Previous Patient Records  Urodynamics Review:   Review Urodynamics Tests   PROCEDURES: None   ASSESSMENT/PLAN:  Urodynamics 01/21/19: Study is performed to evaluate urinary retention.   CMG: He had a maximum cystometric capacity of 317 cc with slight loss of compliance. There was some detrusor instability occurring at 235 cc with a detrusor contraction of 40 cm H2O.  Pressure-flow: He was able to generate a well sustained voluntary contraction with a maximum pressure of 40 cm H2O but was unable to urinate.  Fluoroscopy: Mild trabeculation with elevation of the bladder base consistent with BPH.   Impression: He has a slightly reduced functional capacity with some mild detrusor instability. He was able to generate a voluntary contraction although it was not particularly strong it likely would be sufficient, with reduction of outlet resistance, to result in spontaneous voiding.   After going over his urodynamics results with he and his wife we discussed the options. He does not want to maintain a Foley catheter and I told him that I was afraid that minimally invasive outlet resistance procedures might not be sufficient to allow his weakened detrusor to empty his bladder. I told him that my recommendation was to proceed with transurethral resection of his prostate in order to improve his probability of spontaneous voiding after the procedure but because of his weak detrusor I have recommended also the placement of a suprapubic tube at the time of surgery and we discussed how this could be used if he does not void  spontaneously initially. He understands and would like to proceed with surgery.   1 GU:   Urinary Retention - R33.8 Stable - He came in 1 week prior to his scheduled surgery for a catheterized urine specimen for culture and was placed on Doxy. 2   BPH w/LUTS - N40.1 Stable - He will be scheduled for transurethral resection of his prostate and placement of an SP tube.

## 2019-02-24 ENCOUNTER — Ambulatory Visit (HOSPITAL_COMMUNITY): Payer: Medicare Other | Admitting: Anesthesiology

## 2019-02-24 ENCOUNTER — Other Ambulatory Visit: Payer: Self-pay

## 2019-02-24 ENCOUNTER — Ambulatory Visit (HOSPITAL_COMMUNITY)
Admission: RE | Admit: 2019-02-24 | Discharge: 2019-02-25 | Disposition: A | Discharge status: Home / Self Care | Payer: Medicare Other | Attending: Urology | Admitting: Urology

## 2019-02-24 ENCOUNTER — Encounter (HOSPITAL_COMMUNITY)
Admission: RE | Disposition: A | Discharge status: Home / Self Care | Payer: Self-pay | Source: Home / Self Care | Attending: Urology

## 2019-02-24 ENCOUNTER — Ambulatory Visit (HOSPITAL_COMMUNITY): Payer: Medicare Other | Admitting: Physician Assistant

## 2019-02-24 ENCOUNTER — Encounter (HOSPITAL_COMMUNITY): Payer: Self-pay

## 2019-02-24 DIAGNOSIS — I4891 Unspecified atrial fibrillation: Secondary | ICD-10-CM | POA: Insufficient documentation

## 2019-02-24 DIAGNOSIS — R338 Other retention of urine: Secondary | ICD-10-CM | POA: Diagnosis not present

## 2019-02-24 DIAGNOSIS — N138 Other obstructive and reflux uropathy: Secondary | ICD-10-CM | POA: Diagnosis present

## 2019-02-24 DIAGNOSIS — N3080 Other cystitis without hematuria: Secondary | ICD-10-CM | POA: Diagnosis not present

## 2019-02-24 DIAGNOSIS — Z931 Gastrostomy status: Secondary | ICD-10-CM | POA: Diagnosis not present

## 2019-02-24 DIAGNOSIS — I10 Essential (primary) hypertension: Secondary | ICD-10-CM | POA: Insufficient documentation

## 2019-02-24 DIAGNOSIS — N401 Enlarged prostate with lower urinary tract symptoms: Secondary | ICD-10-CM | POA: Diagnosis not present

## 2019-02-24 DIAGNOSIS — N320 Bladder-neck obstruction: Secondary | ICD-10-CM | POA: Diagnosis not present

## 2019-02-24 DIAGNOSIS — E785 Hyperlipidemia, unspecified: Secondary | ICD-10-CM | POA: Diagnosis not present

## 2019-02-24 DIAGNOSIS — N3289 Other specified disorders of bladder: Secondary | ICD-10-CM | POA: Diagnosis not present

## 2019-02-24 DIAGNOSIS — I471 Supraventricular tachycardia: Secondary | ICD-10-CM | POA: Insufficient documentation

## 2019-02-24 DIAGNOSIS — Z8521 Personal history of malignant neoplasm of larynx: Secondary | ICD-10-CM | POA: Insufficient documentation

## 2019-02-24 DIAGNOSIS — Z79899 Other long term (current) drug therapy: Secondary | ICD-10-CM | POA: Diagnosis not present

## 2019-02-24 DIAGNOSIS — K219 Gastro-esophageal reflux disease without esophagitis: Secondary | ICD-10-CM | POA: Insufficient documentation

## 2019-02-24 DIAGNOSIS — D414 Neoplasm of uncertain behavior of bladder: Secondary | ICD-10-CM | POA: Diagnosis not present

## 2019-02-24 HISTORY — PX: INSERTION OF SUPRAPUBIC CATHETER: SHX5870

## 2019-02-24 HISTORY — PX: TRANSURETHRAL RESECTION OF PROSTATE: SHX73

## 2019-02-24 LAB — GLUCOSE, CAPILLARY: Glucose-Capillary: 171 mg/dL — ABNORMAL HIGH (ref 70–99)

## 2019-02-24 SURGERY — TURP (TRANSURETHRAL RESECTION OF PROSTATE)
Anesthesia: General

## 2019-02-24 MED ORDER — LACTATED RINGERS IV SOLN
INTRAVENOUS | Status: DC
  Administered 2019-02-24 (×2): via INTRAVENOUS

## 2019-02-24 MED ORDER — BUPIVACAINE-EPINEPHRINE 0.25% -1:200000 IJ SOLN
INTRAMUSCULAR | Status: AC
  Filled 2019-02-24: qty 1

## 2019-02-24 MED ORDER — PHENYLEPHRINE 40 MCG/ML (10ML) SYRINGE FOR IV PUSH (FOR BLOOD PRESSURE SUPPORT)
PREFILLED_SYRINGE | INTRAVENOUS | Status: DC | PRN
  Administered 2019-02-24 (×2): 80 ug via INTRAVENOUS
  Administered 2019-02-24: 120 ug via INTRAVENOUS
  Administered 2019-02-24: 80 ug via INTRAVENOUS
  Administered 2019-02-24: 120 ug via INTRAVENOUS
  Administered 2019-02-24: 80 ug via INTRAVENOUS
  Administered 2019-02-24: 120 ug via INTRAVENOUS

## 2019-02-24 MED ORDER — PROPOFOL 10 MG/ML IV BOLUS
INTRAVENOUS | Status: AC
  Filled 2019-02-24: qty 20

## 2019-02-24 MED ORDER — LIDOCAINE 2% (20 MG/ML) 5 ML SYRINGE
INTRAMUSCULAR | Status: AC
  Filled 2019-02-24: qty 5

## 2019-02-24 MED ORDER — FENTANYL CITRATE (PF) 100 MCG/2ML IJ SOLN
INTRAMUSCULAR | Status: AC
  Filled 2019-02-24: qty 2

## 2019-02-24 MED ORDER — PHENYLEPHRINE 40 MCG/ML (10ML) SYRINGE FOR IV PUSH (FOR BLOOD PRESSURE SUPPORT)
PREFILLED_SYRINGE | INTRAVENOUS | Status: AC
  Filled 2019-02-24: qty 20

## 2019-02-24 MED ORDER — ONDANSETRON HCL 4 MG/2ML IJ SOLN: 4.0000 mg | INTRAMUSCULAR | Status: DC | PRN

## 2019-02-24 MED ORDER — SODIUM CHLORIDE 0.9 % IR SOLN
Status: DC | PRN
  Administered 2019-02-24: 18000 mL via INTRAVESICAL

## 2019-02-24 MED ORDER — BUPIVACAINE HCL (PF) 0.5 % IJ SOLN
INTRAMUSCULAR | Status: DC | PRN
  Administered 2019-02-24: 17 mL

## 2019-02-24 MED ORDER — 0.9 % SODIUM CHLORIDE (POUR BTL) OPTIME
TOPICAL | Status: DC | PRN
  Administered 2019-02-24: 1000 mL

## 2019-02-24 MED ORDER — MIDAZOLAM HCL 5 MG/5ML IJ SOLN
INTRAMUSCULAR | Status: DC | PRN
  Administered 2019-02-24: 1 mg via INTRAVENOUS

## 2019-02-24 MED ORDER — BELLADONNA ALKALOIDS-OPIUM 16.2-60 MG RE SUPP: 1.0000 | Freq: Four times a day (QID) | RECTAL | Status: DC | PRN

## 2019-02-24 MED ORDER — BUPIVACAINE HCL (PF) 0.5 % IJ SOLN
INTRAMUSCULAR | Status: AC
  Filled 2019-02-24: qty 30

## 2019-02-24 MED ORDER — LIDOCAINE 2% (20 MG/ML) 5 ML SYRINGE
INTRAMUSCULAR | Status: DC | PRN
  Administered 2019-02-24 (×2): 40 mg via INTRAVENOUS

## 2019-02-24 MED ORDER — CHLORHEXIDINE GLUCONATE CLOTH 2 % EX PADS
6.0000 | MEDICATED_PAD | Freq: Every day | CUTANEOUS | Status: DC
  Administered 2019-02-25: 6 via TOPICAL

## 2019-02-24 MED ORDER — SUGAMMADEX SODIUM 200 MG/2ML IV SOLN
INTRAVENOUS | Status: DC | PRN
  Administered 2019-02-24: 200 mg via INTRAVENOUS

## 2019-02-24 MED ORDER — KATE FARMS PEPTIDE 1.5 PO LIQD
325.0000 mL | Freq: Four times a day (QID) | ORAL | Status: DC
  Filled 2019-02-24 (×3): qty 1

## 2019-02-24 MED ORDER — DEXAMETHASONE SODIUM PHOSPHATE 10 MG/ML IJ SOLN
INTRAMUSCULAR | Status: AC
  Filled 2019-02-24: qty 1

## 2019-02-24 MED ORDER — HYDROMORPHONE HCL 1 MG/ML IJ SOLN: 0.2500 mg | INTRAMUSCULAR | Status: DC | PRN

## 2019-02-24 MED ORDER — ROCURONIUM BROMIDE 10 MG/ML (PF) SYRINGE
PREFILLED_SYRINGE | INTRAVENOUS | Status: DC | PRN
  Administered 2019-02-24: 40 mg via INTRAVENOUS
  Administered 2019-02-24: 20 mg via INTRAVENOUS

## 2019-02-24 MED ORDER — ACETAMINOPHEN 325 MG PO TABS: 650.0000 mg | ORAL_TABLET | ORAL | Status: DC | PRN

## 2019-02-24 MED ORDER — ONDANSETRON HCL 4 MG/2ML IJ SOLN
INTRAMUSCULAR | Status: AC
  Filled 2019-02-24: qty 2

## 2019-02-24 MED ORDER — ACETAMINOPHEN 10 MG/ML IV SOLN: 1000.0000 mg | Freq: Once | INTRAVENOUS | Status: DC | PRN

## 2019-02-24 MED ORDER — KATE FARMS PEPTIDE 1.5 PO LIQD
325.0000 mL | ORAL | Status: DC
  Administered 2019-02-24: 17:00:00 325 mL via GASTROSTOMY
  Filled 2019-02-24 (×6): qty 325

## 2019-02-24 MED ORDER — HYDROCODONE-ACETAMINOPHEN 5-325 MG PO TABS: 1.0000 | ORAL_TABLET | ORAL | Status: DC | PRN

## 2019-02-24 MED ORDER — PROPOFOL 10 MG/ML IV BOLUS
INTRAVENOUS | Status: DC | PRN
  Administered 2019-02-24: 100 mg via INTRAVENOUS
  Administered 2019-02-24: 30 mg via INTRAVENOUS

## 2019-02-24 MED ORDER — DEXAMETHASONE SODIUM PHOSPHATE 10 MG/ML IJ SOLN
INTRAMUSCULAR | Status: DC | PRN
  Administered 2019-02-24: 10 mg via INTRAVENOUS

## 2019-02-24 MED ORDER — PHENYLEPHRINE 40 MCG/ML (10ML) SYRINGE FOR IV PUSH (FOR BLOOD PRESSURE SUPPORT)
PREFILLED_SYRINGE | INTRAVENOUS | Status: AC
  Filled 2019-02-24: qty 10

## 2019-02-24 MED ORDER — JEVITY 1.2 CAL PO LIQD: 1000.0000 mL | ORAL | Status: DC

## 2019-02-24 MED ORDER — EPHEDRINE 5 MG/ML INJ
INTRAVENOUS | Status: AC
  Filled 2019-02-24: qty 10

## 2019-02-24 MED ORDER — PIPERACILLIN-TAZOBACTAM 3.375 G IVPB
3.3750 g | Freq: Three times a day (TID) | INTRAVENOUS | Status: DC
  Administered 2019-02-24 – 2019-02-25 (×3): 3.375 g via INTRAVENOUS
  Filled 2019-02-24 (×4): qty 50

## 2019-02-24 MED ORDER — CEFAZOLIN SODIUM-DEXTROSE 2-3 GM-%(50ML) IV SOLR
INTRAVENOUS | Status: DC | PRN
  Administered 2019-02-24: 2 g via INTRAVENOUS

## 2019-02-24 MED ORDER — EPHEDRINE SULFATE-NACL 50-0.9 MG/10ML-% IV SOSY
PREFILLED_SYRINGE | INTRAVENOUS | Status: DC | PRN
  Administered 2019-02-24: 10 mg via INTRAVENOUS

## 2019-02-24 MED ORDER — PIPERACILLIN-TAZOBACTAM 3.375 G IVPB 30 MIN
3.3750 g | Freq: Once | INTRAVENOUS | Status: AC
  Administered 2019-02-24: 3.375 g via INTRAVENOUS
  Filled 2019-02-24: qty 50

## 2019-02-24 MED ORDER — MIDAZOLAM HCL 2 MG/2ML IJ SOLN
INTRAMUSCULAR | Status: AC
  Filled 2019-02-24: qty 2

## 2019-02-24 MED ORDER — PROMETHAZINE HCL 25 MG/ML IJ SOLN: 6.2500 mg | INTRAMUSCULAR | Status: DC | PRN

## 2019-02-24 MED ORDER — BACITRACIN-NEOMYCIN-POLYMYXIN 400-5-5000 EX OINT: 1.0000 "application " | TOPICAL_OINTMENT | Freq: Three times a day (TID) | CUTANEOUS | Status: DC | PRN

## 2019-02-24 MED ORDER — ESMOLOL HCL 100 MG/10ML IV SOLN
INTRAVENOUS | Status: DC | PRN
  Administered 2019-02-24: 30 mg via INTRAVENOUS

## 2019-02-24 MED ORDER — FENTANYL CITRATE (PF) 100 MCG/2ML IJ SOLN
INTRAMUSCULAR | Status: DC | PRN
  Administered 2019-02-24 (×2): 50 ug via INTRAVENOUS

## 2019-02-24 MED ORDER — ONDANSETRON HCL 4 MG/2ML IJ SOLN
INTRAMUSCULAR | Status: DC | PRN
  Administered 2019-02-24: 4 mg via INTRAVENOUS

## 2019-02-24 MED ORDER — KATE FARMS PEPTIDE 1.5 PO LIQD
325.0000 mL | ORAL | Status: DC
  Filled 2019-02-24 (×2): qty 325

## 2019-02-24 MED ORDER — STERILE WATER FOR IRRIGATION IR SOLN
Status: DC | PRN
  Administered 2019-02-24: 500 mL

## 2019-02-24 MED ORDER — CEFAZOLIN SODIUM-DEXTROSE 2-4 GM/100ML-% IV SOLN
INTRAVENOUS | Status: AC
  Filled 2019-02-24: qty 100

## 2019-02-24 MED ORDER — SODIUM CHLORIDE 0.9 % IR SOLN
3000.0000 mL | Status: DC
  Administered 2019-02-24: 10:00:00 3000 mL

## 2019-02-24 SURGICAL SUPPLY — 41 items
BAG URINE DRAIN 2000ML AR STRL (UROLOGICAL SUPPLIES) ×2 IMPLANT
BAG URO CATCHER STRL LF (MISCELLANEOUS) ×2 IMPLANT
BLADE CLIPPER SURG (BLADE) IMPLANT
BLADE SURG 15 STRL LF DISP TIS (BLADE) ×1 IMPLANT
BLADE SURG 15 STRL SS (BLADE) ×1
CATH BONANNO SUPRAPUBIC 14G (CATHETERS) IMPLANT
CATH FOLEY 2WAY SLVR  5CC 16FR (CATHETERS)
CATH FOLEY 2WAY SLVR  5CC 18FR (CATHETERS) ×1
CATH FOLEY 2WAY SLVR 30CC 20FR (CATHETERS) ×2 IMPLANT
CATH FOLEY 2WAY SLVR 5CC 16FR (CATHETERS) IMPLANT
CATH FOLEY 2WAY SLVR 5CC 18FR (CATHETERS) ×1 IMPLANT
CATH FOLEY 3WAY 30CC 24FR (CATHETERS) ×1
CATH URTH STD 24FR FL 3W 2 (CATHETERS) ×1 IMPLANT
COVER SURGICAL LIGHT HANDLE (MISCELLANEOUS) ×2 IMPLANT
COVER WAND RF STERILE (DRAPES) IMPLANT
ELECT REM PT RETURN 15FT ADLT (MISCELLANEOUS) IMPLANT
EVACUATOR MICROVAS BLADDER (UROLOGICAL SUPPLIES) ×2 IMPLANT
GAUZE SPONGE 4X4 12PLY STRL (GAUZE/BANDAGES/DRESSINGS) ×2 IMPLANT
GLOVE BIO SURGEON STRL SZ8 (GLOVE) ×2 IMPLANT
GLOVE BIOGEL M 8.0 STRL (GLOVE) ×2 IMPLANT
GOWN STRL REUS W/TWL XL LVL3 (GOWN DISPOSABLE) ×2 IMPLANT
HOLDER FOLEY CATH W/STRAP (MISCELLANEOUS) ×2 IMPLANT
IV NS IRRIG 3000ML ARTHROMATIC (IV SOLUTION) IMPLANT
KIT TURNOVER KIT A (KITS) IMPLANT
LOOP CUT BIPOLAR 24F LRG (ELECTROSURGICAL) ×2 IMPLANT
MANIFOLD NEPTUNE II (INSTRUMENTS) ×2 IMPLANT
NEEDLE HYPO 25X1 1.5 SAFETY (NEEDLE) ×2 IMPLANT
NS IRRIG 1000ML POUR BTL (IV SOLUTION) ×2 IMPLANT
PACK CYSTO (CUSTOM PROCEDURE TRAY) ×2 IMPLANT
PENCIL SMOKE EVACUATOR (MISCELLANEOUS) IMPLANT
SUT ETHILON 3 0 PS 1 (SUTURE) IMPLANT
SUT SILK 0 (SUTURE) ×1
SUT SILK 0 30XBRD TIE 6 (SUTURE) ×1 IMPLANT
SUT SILK 2 0 PERMA HAND 18 BK (SUTURE) ×2 IMPLANT
SYR 30ML LL (SYRINGE) ×2 IMPLANT
SYRINGE IRR TOOMEY STRL 70CC (SYRINGE) IMPLANT
TAPE CLOTH SURG 4X10 WHT LF (GAUZE/BANDAGES/DRESSINGS) ×2 IMPLANT
TUBING CONNECTING 10 (TUBING) ×2 IMPLANT
TUBING UROLOGY SET (TUBING) ×2 IMPLANT
WATER STERILE IRR 3000ML UROMA (IV SOLUTION) ×2 IMPLANT
WIRE COONS/BENSON .038X145CM (WIRE) IMPLANT

## 2019-02-24 NOTE — Anesthesia Procedure Notes (Signed)
Procedure Name: Intubation Date/Time: 02/24/2019 8:47 AM Performed by: Silas [place], CRNA Pre-anesthesia Checklist: Patient identified, Emergency Drugs available, Suction available and Patient being monitored Patient Re-evaluated:Patient Re-evaluated prior to induction Oxygen Delivery Method: Circle system utilized Preoxygenation: Pre-oxygenation with 100% oxygen Induction Type: IV induction Tube type: Oral Tube size: 6.0 mm Number of attempts: 1 Placement Confirmation: positive ETCO2 and breath sounds checked- equal and bilateral Secured at: 14 (14cm at skin) cm Tube secured with: Tape Dental Injury: Teeth and Oropharynx as per pre-operative assessment  Comments: Uncuffed 6.0 trach exchanged for 6.0 ETT inserted through stoma. + ETCO2, and BBS.

## 2019-02-24 NOTE — Transfer of Care (Signed)
Immediate Anesthesia Transfer of Care Note  Patient: Logan Price  Procedure(s) Performed: TRANSURETHRAL RESECTION OF THE PROSTATE (TURP) (N/A ) INSERTION OF SUPRAPUBIC CATHETER (N/A )  Patient Location: PACU  Anesthesia Type:General  Level of Consciousness: awake, patient cooperative and responds to stimulation  Airway & Oxygen Therapy: Patient Spontanous Breathing and Patient connected to tracheostomy mask oxygen  Post-op Assessment: Report given to RN and Post -op Vital signs reviewed and stable  Post vital signs: Reviewed and stable  Last Vitals:  Vitals Value Taken Time  BP 116/93 02/24/19 1005  Temp    Pulse 94 02/24/19 1007  Resp 23 02/24/19 1007  SpO2 100 % 02/24/19 1007  Vitals shown include unvalidated device data.  Last Pain:  Vitals:   02/24/19 0631  TempSrc: Oral         Complications: No apparent anesthesia complications

## 2019-02-24 NOTE — Anesthesia Postprocedure Evaluation (Signed)
Anesthesia Post Note  Patient: Logan Price  Procedure(s) Performed: TRANSURETHRAL RESECTION OF THE PROSTATE (TURP) (N/A ) INSERTION OF SUPRAPUBIC CATHETER (N/A )     Patient location during evaluation: PACU Anesthesia Type: General Level of consciousness: awake and alert and oriented Pain management: pain level controlled Vital Signs Assessment: post-procedure vital signs reviewed and stable Respiratory status: spontaneous breathing, nonlabored ventilation and respiratory function stable Cardiovascular status: blood pressure returned to baseline Postop Assessment: no apparent nausea or vomiting Anesthetic complications: no    Last Vitals:  Vitals:   02/24/19 1100 02/24/19 1115  BP: 133/78 134/73  Pulse: 91 91  Resp: 20 19  Temp:    SpO2: 97% 98%    Last Pain:  Vitals:   02/24/19 1100  TempSrc:   PainSc: 0-No pain                 Brennan Bailey

## 2019-02-24 NOTE — Plan of Care (Signed)
  Problem: Education: Goal: Knowledge of General Education information will improve Description: Including pain rating scale, medication(s)/side effects and non-pharmacologic comfort measures Outcome: Progressing   Problem: Health Behavior/Discharge Planning: Goal: Ability to manage health-related needs will improve Outcome: Progressing   Problem: Clinical Measurements: Goal: Ability to maintain clinical measurements within normal limits will improve Outcome: Progressing Goal: Will remain free from infection Outcome: Progressing Goal: Diagnostic test results will improve Outcome: Progressing Goal: Respiratory complications will improve Outcome: Progressing Goal: Cardiovascular complication will be avoided Outcome: Progressing   Problem: Activity: Goal: Risk for activity intolerance will decrease Outcome: Progressing   Problem: Nutrition: Goal: Adequate nutrition will be maintained Outcome: Progressing   Problem: Coping: Goal: Level of anxiety will decrease Outcome: Progressing   Problem: Elimination: Goal: Will not experience complications related to bowel motility Outcome: Progressing Goal: Will not experience complications related to urinary retention Outcome: Progressing   Problem: Pain Managment: Goal: General experience of comfort will improve Outcome: Progressing   Problem: Safety: Goal: Ability to remain free from injury will improve Outcome: Progressing   Problem: Skin Integrity: Goal: Risk for impaired skin integrity will decrease Outcome: Progressing   Problem: Education: Goal: Knowledge about tracheostomy care/management will improve Outcome: Progressing   Problem: Activity: Goal: Ability to tolerate increased activity will improve Outcome: Progressing   Problem: Health Behavior/Discharge Planning: Goal: Ability to manage tracheostomy will improve Outcome: Progressing   Problem: Respiratory: Goal: Patent airway maintenance will  improve Outcome: Progressing   Problem: Role Relationship: Goal: Ability to communicate will improve Outcome: Progressing   

## 2019-02-24 NOTE — Progress Notes (Signed)
Night of surgery note  Subjective: The patient is doing well.  No complaints.  Objective: Vital signs in last 24 hours: Temp:  [97.3 F (36.3 C)-98.4 F (36.9 C)] 97.4 F (36.3 C) (11/30 1200) Pulse Rate:  [91-102] 93 (11/30 1300) Resp:  [17-24] 18 (11/30 1300) BP: (116-139)/(68-93) 133/75 (11/30 1300) SpO2:  [93 %-100 %] 98 % (11/30 1300)  Intake/Output this shift: Total I/O In: 1353 [I.V.:153; Other:1100; IV Piggyback:100] Out: 1945 [LVDIX:1855; Blood:20]  Physical Exam:  General: Alert and oriented. Abdomen: Soft, Nondistended. Suprapubic site: Clean with dry, intact dressing.  Foley catheter draining nearly clear urine on slow CBI.  Lab Results: No results for input(s): HGB, HCT in the last 72 hours.  Assessment/Plan: 1) Continue to monitor overnight. 2) Per orders. 3)  Plan to discharge in a.m. with SP tube.   Davell Beckstead C. Karsten Ro, MD  Claybon Jabs 02/24/2019, 1:15 PM

## 2019-02-24 NOTE — Progress Notes (Signed)
Patient arrived to unit. Suprapubic, CBI attached and intact. Dressing has old drainage. Foley catheter clear urine. CBI decreased urine now clear yellow. Will continue to monitor.

## 2019-02-24 NOTE — Progress Notes (Signed)
PT states he uses 2 lpm [place] at night, at home. PT was offered and educated on benefits of humidity to trach- PT refuses at this time (set up in room). PT currently on 2 lpm [place] post surgery- sp02 97%, HR 101, RR 16, BBS diminished- clear. PT states he does not need to suctioned at this time. All emergency equipment in room at this time (except bedside pulse ox- Secretary has ordered and RN knows to expect delivery and to administer to PT).

## 2019-02-24 NOTE — Op Note (Addendum)
PATIENT:  Logan Price  PRE-OPERATIVE DIAGNOSIS: BPH with outlet obstruction  POST-OPERATIVE DIAGNOSIS: Same  PROCEDURE:  1. Suprapubic Tube Placement 2. Bladder biopsy 3. TURP 4. 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 Has failed trial of voids and medical therapy. Urodynamics with sustained voluntary contractions but inability to void. Opted for TURP and SPT placement.   ANESTHESIA:  General  EBL:  Minimal  DRAINS: 18Fr Suprapubic tube. 20Fr Foley catheter (30cc in balloon)  SPECIMEN:  1. Bladder biopsy 2. Prostate chips to pathology  After informed consent the patient was brought to the major OR and placed on the table. He was administered general anesthesia and then moved to the dorsal lithotomy position. His genitalia and suprapubic region was sterilely prepped and draped and an official timeout was then performed.  Initially, the 73 French cystoscope was inserted per urethra noting by lobar hypertrophy, likely irritative/edematous bladder mucosa particular along the posterior wall.  The bladder was filled, the [place] retractor was then inserted and palpated at the suprapubic region approximately 2 fingerbreadths above the pubic senses.  Stab incision was made on top of the [place] retractor was a retractor was delivered through the incision.  Suprapubic tube was grasped and pulled through and through.  The 83 French cystoscope was then reinserted and followed the catheter back into the bladder the balloon was inflated under direct visualization.  No bleeding was noted from the dome of the bladder.  The 31 French resectoscope sheath with the visual obturator was passed into the bladder and the obturator removed. I then inserted the resectoscope element  with 30 lens and performed a systematic inspection of the bladder. I noted the bladder had _2+ trabeculation and what appeared to be irritative/edematous area along the posterior wall.  1 resection pass was made at this location pathology was sent separately.  The ureteral orifices were noted to be of normal configuration and position. Withdrawing the scope into the prostatic urethra I noted obstructing bilobar hypertrophy with short prostatic urethra and a mild median lobe component.  Resection was then begun. I first resected the median lobe in the midline down to the level of the bladder neck. I then began resecting the right lobe of the prostate by resecting first from the level of the bladder neck back to the level of the Veru and then progressed in a clockwise direction resecting all of the adenomatous tissue of the right lobe down to the surgical capsule. Bleeding points were cauterized as they were encountered. I then turned my attention to the left lobe of the prostate and it was resected in an identical fashion. Tissue in the area of the apex was then resected circumferentially with care being taken to maintain the resection proximal to the Veru at all times. The prostatic chips were then flushed into the bladder and the Microvasive evacuator was then used to evacuate all chips from the bladder. Reinspection of the bladder revealed the mucosa to be intact, the ureteral orifices intact as well and well away from the bladder neck and area of resection. There were no prostatic chips remaining within the bladder. The prostatic capsule was intact throughout with no perforation and there was no active bleeding noted at the end of the procedure.  The resectoscope was therefore removed  and the 20 [place] three-way Foley catheter was then inserted and balloon was filled to 30 cc. The bladder was irrigated through the SPT with the irrigant returning clear. The catheter was then hooked to closed system drainage  and continuous irrigation and the patient was awakened and taken to recovery room in stable and satisfactory condition. He tolerated the procedure well and there were no intraoperative complications.  PLAN OF CARE: Observation overnight with anticipated discharge in the morning.  PATIENT DISPOSITION:  PACU - Hemodynamically stable.

## 2019-02-24 NOTE — Plan of Care (Signed)

## 2019-02-25 ENCOUNTER — Encounter (HOSPITAL_COMMUNITY): Payer: Self-pay | Admitting: Urology

## 2019-02-25 DIAGNOSIS — N401 Enlarged prostate with lower urinary tract symptoms: Secondary | ICD-10-CM | POA: Diagnosis not present

## 2019-02-25 DIAGNOSIS — N138 Other obstructive and reflux uropathy: Secondary | ICD-10-CM | POA: Diagnosis not present

## 2019-02-25 DIAGNOSIS — R338 Other retention of urine: Secondary | ICD-10-CM | POA: Diagnosis not present

## 2019-02-25 DIAGNOSIS — N3289 Other specified disorders of bladder: Secondary | ICD-10-CM | POA: Diagnosis not present

## 2019-02-25 DIAGNOSIS — D414 Neoplasm of uncertain behavior of bladder: Secondary | ICD-10-CM | POA: Diagnosis not present

## 2019-02-25 DIAGNOSIS — N3080 Other cystitis without hematuria: Secondary | ICD-10-CM | POA: Diagnosis not present

## 2019-02-25 LAB — GLUCOSE, CAPILLARY
Glucose-Capillary: 125 mg/dL — ABNORMAL HIGH (ref 70–99)
Glucose-Capillary: 140 mg/dL — ABNORMAL HIGH (ref 70–99)
Glucose-Capillary: 97 mg/dL (ref 70–99)

## 2019-02-25 LAB — SURGICAL PATHOLOGY

## 2019-02-25 MED ORDER — PHENAZOPYRIDINE HCL 200 MG PO TABS: 200.0000 mg | ORAL_TABLET | Freq: Three times a day (TID) | ORAL | 0 refills | Status: DC | PRN

## 2019-02-25 MED ORDER — CIPROFLOXACIN HCL 500 MG PO TABS: 500.0000 mg | ORAL_TABLET | Freq: Two times a day (BID) | ORAL | 0 refills | Status: DC

## 2019-02-25 MED ORDER — HYDROCODONE-ACETAMINOPHEN 10-325 MG PO TABS: 1.0000 | ORAL_TABLET | ORAL | 0 refills | Status: DC | PRN

## 2019-02-25 MED ORDER — KATE FARMS PEPTIDE 1.5 PO LIQD: 325.0000 mL | ORAL | Status: DC

## 2019-02-25 NOTE — Discharge Summary (Signed)
Physician Discharge Summary      Patient ID: Logan Price MRN: [id number] DOB/AGE: [birth year] 73 y.o.  Admit date: 02/24/2019 Discharge date: 02/25/2019  Admission Diagnoses: BENIGN PROSTATIC HYPERPLASIA WITH RETENTION  Discharge Diagnoses:  Active Problems:   BPH with urinary obstruction   Discharged Condition: good  Hospital Course: The patient was admitted and taken to the operating room for elective transurethral resection of his prostate due to BPH with outlet obstruction and urinary retention.  He was found by urodynamics to have a detrusor contraction that was slightly weak and therefore my recommendation was placement of a suprapubic tube at the time of his TURP.  This was undertaken without complication or difficulties and the night of his surgery he was doing well with no pain.  His urine was clear on slow CBI and to the following morning, with his urine remaining clear, his CBI was stopped and his suprapubic tube has been capped.  He has not voided yet but I have discussed with him the use of his suprapubic tube to allow him to empty his bladder if he is not able to void and when he does start to void spontaneously it can be used to monitor his postvoid residual which I discussed with him today.  He will therefore be discharged.  I am going to place him on a short course of antibiotics due to the presence of indwelling Foley catheter preoperatively and see him back in the office for determination of the need for further suprapubic catheter drainage at that time.  He is tolerating his diet through his PEG tube and is experiencing no discomfort.  He is therefore felt ready for discharge at this time.  Discharge Exam: Blood pressure 124/72, pulse 87, temperature 98.2 F (36.8 C), temperature source Oral, resp. rate 16, height 5\' 9"  (1.753 m), weight 63 kg, SpO2 100 %. General: Awake, alert and in no apparent distress. Chest: Normal respiratory effort. Cardiovascular: Regular rate  and rhythm. Abdomen: Soft, nontender, nondistended. SP tube site: Dry with dressing in place.   Disposition: Discharge disposition: 01-Home or Self Care       Discharge Instructions    Discharge patient   Complete by: As directed    Discharge disposition: 01-Home or Self Care   Discharge patient date: 02/25/2019     Allergies as of 02/25/2019   No Known Allergies     Medication List    STOP taking these medications   fluticasone 50 MCG/ACT nasal spray Commonly known as: FLONASE   hydrocortisone 2.5 % rectal cream Commonly known as: ANUSOL-HC   ipratropium-albuterol 0.5-2.5 (3) MG/3ML Soln Commonly known as: DUONEB   loperamide HCl 1 MG/7.5ML suspension Commonly known as: IMODIUM   metoprolol tartrate 25 mg/10 mL Susp Commonly known as: LOPRESSOR     TAKE these medications   ciprofloxacin 500 MG tablet Commonly known as: CIPRO Take 1 tablet (500 mg total) by mouth 2 (two) times daily.   doxazosin 1 MG tablet Commonly known as: CARDURA Place 1 tablet (1 mg total) into feeding tube daily. What changed: when to take this   feeding supplement (PRO-STAT SUGAR FREE 64) Liqd Place 30 mLs into feeding tube 2 (two) times daily.   free water Soln Place 200 mLs into feeding tube every 4 (four) hours.   HYDROcodone-acetaminophen 10-325 MG tablet Commonly known as: NORCO Take 1-2 tablets by mouth every 4 (four) hours as needed for moderate pain. Maximum dose per 24 hours - 8 pills   Molli Posey  Peptide 1.5 Liqd 325 mLs by PEG Tube route continuous. What changed: when to take this   pantoprazole 40 MG tablet Commonly known as: Protonix 1 po 30 mins prior to first meal What changed:   how much to take  how to take this  when to take this  additional instructions   phenazopyridine 200 MG tablet Commonly known as: Pyridium Take 1 tablet (200 mg total) by mouth 3 (three) times daily as needed for pain.   scopolamine 1 MG/3DAYS Commonly known as:  TRANSDERM-SCOP Place 1 patch (1.5 mg total) onto the skin every 3 (three) days.   simvastatin 20 MG tablet Commonly known as: ZOCOR Take 1 tablet (20 mg total) by mouth at bedtime. (Needs to be seen before next refill)      Follow-up Information    [facility] On 02/27/2019.   Why: For your appiontment at 8:30 Contact information: [address] [place] [address] [place] [address] [phone]          Signed: [place]ett [place] 02/25/2019, 7:06 AM

## 2019-02-25 NOTE — Progress Notes (Signed)
Discharge instructions reviewed with patient and all questions answered. Patient stated that he was able to void a small amount of urine out of his penis since his foley catheter was removed. Pt verbalized understanding on when to drain his bladder via suprapubic if unable spontaneously void thorough penis.

## 2019-02-27 DIAGNOSIS — R338 Other retention of urine: Secondary | ICD-10-CM | POA: Diagnosis not present

## 2019-02-27 DIAGNOSIS — N401 Enlarged prostate with lower urinary tract symptoms: Secondary | ICD-10-CM | POA: Diagnosis not present

## 2019-03-05 ENCOUNTER — Telehealth: Payer: Self-pay | Admitting: *Deleted

## 2019-03-05 DIAGNOSIS — R1310 Dysphagia, unspecified: Secondary | ICD-10-CM

## 2019-03-05 DIAGNOSIS — R1319 Other dysphagia: Secondary | ICD-10-CM

## 2019-03-05 DIAGNOSIS — C329 Malignant neoplasm of larynx, unspecified: Secondary | ICD-10-CM

## 2019-03-05 NOTE — Telephone Encounter (Signed)
VM from [place] w/ [facility] Would like VO to continue ST Also order for modified barium swallow

## 2019-03-06 ENCOUNTER — Telehealth: Payer: Self-pay | Admitting: *Deleted

## 2019-03-06 NOTE — Telephone Encounter (Signed)
Ok to continue ST and to order modified barium swallow

## 2019-03-06 NOTE — Telephone Encounter (Signed)
Done

## 2019-03-06 NOTE — Telephone Encounter (Signed)
Called patient to inform of CT and Pet for 03-11-19- arrival time- 2:45 pm @ [facility], patient to have water only - 4 hrs. prior to test, patient to receive results on 03-12-19 @ 11 am, spoke with patient and he is aware of these appts.

## 2019-03-06 NOTE — Addendum Note (Signed)
Addended by: Evelina Dun A on: 03/06/2019 04:34 PM   Modules accepted: Orders

## 2019-03-06 NOTE — Telephone Encounter (Signed)
Can you order Barium Swallow

## 2019-03-07 ENCOUNTER — Other Ambulatory Visit: Payer: Self-pay

## 2019-03-07 ENCOUNTER — Other Ambulatory Visit (HOSPITAL_COMMUNITY): Payer: Self-pay | Admitting: *Deleted

## 2019-03-07 ENCOUNTER — Ambulatory Visit (INDEPENDENT_AMBULATORY_CARE_PROVIDER_SITE_OTHER): Payer: Medicare Other

## 2019-03-07 DIAGNOSIS — I10 Essential (primary) hypertension: Secondary | ICD-10-CM

## 2019-03-07 DIAGNOSIS — R1319 Other dysphagia: Secondary | ICD-10-CM | POA: Diagnosis not present

## 2019-03-07 DIAGNOSIS — Z430 Encounter for attention to tracheostomy: Secondary | ICD-10-CM

## 2019-03-07 DIAGNOSIS — C320 Malignant neoplasm of glottis: Secondary | ICD-10-CM

## 2019-03-07 DIAGNOSIS — C329 Malignant neoplasm of larynx, unspecified: Secondary | ICD-10-CM

## 2019-03-07 DIAGNOSIS — Z431 Encounter for attention to gastrostomy: Secondary | ICD-10-CM

## 2019-03-07 DIAGNOSIS — R1310 Dysphagia, unspecified: Secondary | ICD-10-CM

## 2019-03-07 DIAGNOSIS — J90 Pleural effusion, not elsewhere classified: Secondary | ICD-10-CM

## 2019-03-07 DIAGNOSIS — R2681 Unsteadiness on feet: Secondary | ICD-10-CM

## 2019-03-07 DIAGNOSIS — R1312 Dysphagia, oropharyngeal phase: Secondary | ICD-10-CM

## 2019-03-07 DIAGNOSIS — E43 Unspecified severe protein-calorie malnutrition: Secondary | ICD-10-CM

## 2019-03-07 DIAGNOSIS — M6281 Muscle weakness (generalized): Secondary | ICD-10-CM

## 2019-03-07 DIAGNOSIS — Z9981 Dependence on supplemental oxygen: Secondary | ICD-10-CM

## 2019-03-07 DIAGNOSIS — R339 Retention of urine, unspecified: Secondary | ICD-10-CM

## 2019-03-07 DIAGNOSIS — T827XXD Infection and inflammatory reaction due to other cardiac and vascular devices, implants and grafts, subsequent encounter: Secondary | ICD-10-CM

## 2019-03-07 DIAGNOSIS — C328 Malignant neoplasm of overlapping sites of larynx: Secondary | ICD-10-CM

## 2019-03-07 DIAGNOSIS — B965 Pseudomonas (aeruginosa) (mallei) (pseudomallei) as the cause of diseases classified elsewhere: Secondary | ICD-10-CM

## 2019-03-07 DIAGNOSIS — J189 Pneumonia, unspecified organism: Secondary | ICD-10-CM

## 2019-03-07 DIAGNOSIS — R498 Other voice and resonance disorders: Secondary | ICD-10-CM

## 2019-03-07 DIAGNOSIS — I700 Atherosclerosis of aorta: Secondary | ICD-10-CM

## 2019-03-07 DIAGNOSIS — Z466 Encounter for fitting and adjustment of urinary device: Secondary | ICD-10-CM

## 2019-03-08 ENCOUNTER — Other Ambulatory Visit: Payer: Self-pay

## 2019-03-08 ENCOUNTER — Encounter (HOSPITAL_COMMUNITY): Payer: Self-pay | Admitting: Emergency Medicine

## 2019-03-08 ENCOUNTER — Inpatient Hospital Stay (HOSPITAL_COMMUNITY)
Admission: EM | Admit: 2019-03-08 | Discharge: 2019-03-11 | DRG: 395 | Disposition: A | Discharge status: Home / Self Care | Payer: Medicare Other | Attending: Internal Medicine | Admitting: Internal Medicine

## 2019-03-08 DIAGNOSIS — Z930 Tracheostomy status: Secondary | ICD-10-CM | POA: Diagnosis not present

## 2019-03-08 DIAGNOSIS — K219 Gastro-esophageal reflux disease without esophagitis: Secondary | ICD-10-CM | POA: Diagnosis present

## 2019-03-08 DIAGNOSIS — K9420 Gastrostomy complication, unspecified: Secondary | ICD-10-CM

## 2019-03-08 DIAGNOSIS — Z931 Gastrostomy status: Secondary | ICD-10-CM

## 2019-03-08 DIAGNOSIS — Z431 Encounter for attention to gastrostomy: Principal | ICD-10-CM

## 2019-03-08 DIAGNOSIS — Z8249 Family history of ischemic heart disease and other diseases of the circulatory system: Secondary | ICD-10-CM

## 2019-03-08 DIAGNOSIS — Z79899 Other long term (current) drug therapy: Secondary | ICD-10-CM

## 2019-03-08 DIAGNOSIS — E785 Hyperlipidemia, unspecified: Secondary | ICD-10-CM | POA: Diagnosis not present

## 2019-03-08 DIAGNOSIS — Z20828 Contact with and (suspected) exposure to other viral communicable diseases: Secondary | ICD-10-CM | POA: Diagnosis not present

## 2019-03-08 DIAGNOSIS — Z87891 Personal history of nicotine dependence: Secondary | ICD-10-CM

## 2019-03-08 DIAGNOSIS — Z8521 Personal history of malignant neoplasm of larynx: Secondary | ICD-10-CM

## 2019-03-08 LAB — CBC WITH DIFFERENTIAL/PLATELET
Abs Immature Granulocytes: 0.02 10*3/uL (ref 0.00–0.07)
Basophils Absolute: 0.1 10*3/uL (ref 0.0–0.1)
Basophils Relative: 1 %
Eosinophils Absolute: 0.1 10*3/uL (ref 0.0–0.5)
Eosinophils Relative: 2 %
HCT: 39.9 % (ref 39.0–52.0)
Hemoglobin: 12.5 g/dL — ABNORMAL LOW (ref 13.0–17.0)
Immature Granulocytes: 0 %
Lymphocytes Relative: 10 %
Lymphs Abs: 0.6 10*3/uL — ABNORMAL LOW (ref 0.7–4.0)
MCH: 28.3 pg (ref 26.0–34.0)
MCHC: 31.3 g/dL (ref 30.0–36.0)
MCV: 90.3 fL (ref 80.0–100.0)
Monocytes Absolute: 0.6 10*3/uL (ref 0.1–1.0)
Monocytes Relative: 9 %
Neutro Abs: 4.8 10*3/uL (ref 1.7–7.7)
Neutrophils Relative %: 78 %
Platelets: 276 10*3/uL (ref 150–400)
RBC: 4.42 MIL/uL (ref 4.22–5.81)
RDW: 14.2 % (ref 11.5–15.5)
WBC: 6.1 10*3/uL (ref 4.0–10.5)
nRBC: 0 % (ref 0.0–0.2)

## 2019-03-08 LAB — BASIC METABOLIC PANEL
Anion gap: 10 (ref 5–15)
BUN: 27 mg/dL — ABNORMAL HIGH (ref 8–23)
CO2: 29 mmol/L (ref 22–32)
Calcium: 9.3 mg/dL (ref 8.9–10.3)
Chloride: 102 mmol/L (ref 98–111)
Creatinine, Ser: 0.83 mg/dL (ref 0.61–1.24)
GFR calc Af Amer: >60 mL/min (ref >60)
GFR calc non Af Amer: >60 mL/min (ref >60)
Glucose, Bld: 105 mg/dL — ABNORMAL HIGH (ref 70–99)
Potassium: 4.1 mmol/L (ref 3.5–5.1)
Sodium: 141 mmol/L (ref 135–145)

## 2019-03-08 MED ORDER — SODIUM CHLORIDE 0.9 % IV SOLN
INTRAVENOUS | Status: DC
  Administered 2019-03-09 (×2): via INTRAVENOUS

## 2019-03-08 MED ORDER — MORPHINE SULFATE (PF) 2 MG/ML IV SOLN: 2.0000 mg | INTRAVENOUS | Status: DC | PRN

## 2019-03-08 MED ORDER — ACETAMINOPHEN 650 MG RE SUPP: 650.0000 mg | Freq: Four times a day (QID) | RECTAL | Status: DC | PRN

## 2019-03-08 MED ORDER — ONDANSETRON HCL 4 MG/2ML IJ SOLN: 4.0000 mg | Freq: Four times a day (QID) | INTRAMUSCULAR | Status: DC | PRN

## 2019-03-08 MED ORDER — ACETAMINOPHEN 325 MG PO TABS: 650.0000 mg | ORAL_TABLET | Freq: Four times a day (QID) | ORAL | Status: DC | PRN

## 2019-03-08 MED ORDER — ONDANSETRON HCL 4 MG PO TABS: 4.0000 mg | ORAL_TABLET | Freq: Four times a day (QID) | ORAL | Status: DC | PRN

## 2019-03-08 NOTE — H&P (Signed)
History and Physical    Logan Price ZOX:[id number] DOB: 1945/04/11 DOA: 03/08/2019  PCP: [name], FNP  Patient coming from: Home  I have personally briefly reviewed patient's old medical records in [facility]  Chief Complaint: PEG tube pulled out  HPI: Logan Price is a 73 y.o. male with medical history significant of head and neck CA, with trach and PEG.  Accidentally pulled out PEG tube when taking off a shirt at 3pm.  No pain.  Takes all meds and nutrition through PEG tube though.   ED Course: EDP unable to replace PEG.  IR consulted and they will work patient in tomorrow for PEG placement.  Hospitalist asked to admit in the mean time.   Review of Systems: As per HPI, otherwise all review of systems negative.  Past Medical History:  Diagnosis Date  . 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 Diverticulitis   . Dysrhythmia 09/2018   episode of SVT while in hospital  . 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 GERD (gastroesophageal reflux disease)   . HTN (hypertension)   . Hyperlipidemia     Past Surgical History:  Procedure Laterality Date  . BIOPSY  05/04/2015   Procedure: BIOPSY;  Surgeon: West Bali, MD;  Location: [facility];  Service: Endoscopy;;  bx's of ileocecal valve   . COLONOSCOPY WITH PROPOFOL N/A 05/04/2015   Dr. Darrick Penna: normal appearing ileum with prominent IC valve with tubular adenomas, moderate diverticulosis in sigmoid colon, ascending colon, and retum. Moderate sized internal hemorrhoids. Surveillance in 5 years  . ESOPHAGOGASTRODUODENOSCOPY (EGD) WITH PROPOFOL N/A 12/19/2016   Procedure: ESOPHAGOGASTRODUODENOSCOPY (EGD) WITH PROPOFOL;  Surgeon: West Bali, MD;  [facility]: [facility];  Service: Endoscopy;  Laterality: N/A;  11:30am  . FLEXIBLE SIGMOIDOSCOPY N/A 12/10/2015   hemorrhoid banding X 3   . HEMORRHOID BANDING N/A 12/10/2015   Procedure: HEMORRHOID BANDING;  Surgeon: West Bali, MD;  [facility]: [facility];  Service: Endoscopy;  Laterality: N/A;  1:30 PM  . INSERTION OF SUPRAPUBIC CATHETER N/A 02/24/2019   Procedure: INSERTION OF SUPRAPUBIC CATHETER;  Surgeon: Ihor Gully, MD;  Location: [facility];  Service: Urology;  Laterality: N/A;  . IR CM INJ ANY COLONIC TUBE W/FLUORO  10/30/2018  . IR GASTROSTOMY TUBE MOD SED  10/04/2018  . IR IMAGING GUIDED PORT INSERTION  10/04/2018  . IR REMOVAL TUN ACCESS W/ PORT W/O FL MOD SED  10/14/2018  . IR REPLACE G-TUBE SIMPLE WO FLUORO  11/03/2018  . LAPAROSCOPIC INSERTION GASTROSTOMY TUBE Left 10/07/2018   Procedure: LAPAROSCOPIC  GASTROSTOMY TUBE;  Surgeon: Kinsinger, De Blanch, MD;  Location: [facility];  Service: General;  Laterality: Left;  Marland Kitchen MICROLARYNGOSCOPY N/A 09/27/2018   Procedure: MICRO DIRECT LARYNGOSCOPY WITH BIOPSY;  Surgeon: Newman Pies, MD;  Location: [facility] OR;  Service: ENT;  Laterality: N/A;  . None to date     As of 04/14/15  . POLYPECTOMY  05/04/2015   Procedure: POLYPECTOMY;  Surgeon: West Bali, MD;  [facility];  Service: Endoscopy;;  descending colon polyp, ascending colon polyp  . SAVORY DILATION N/A 12/19/2016   Procedure: SAVORY DILATION;  Surgeon: West Bali, MD;  [facility];  Service: Endoscopy;  Laterality: N/A;  . TRACHEOSTOMY TUBE PLACEMENT N/A 09/27/2018   Procedure: AWAKE TRACHEOSTOMY;  Surgeon: Newman Pies, MD;  Location: [facility];  Service: ENT;  Laterality: N/A;  . TRANSURETHRAL RESECTION OF PROSTATE N/A 02/24/2019  Procedure: TRANSURETHRAL RESECTION OF THE PROSTATE (TURP);  Surgeon: Ihor Gully, MD;  Location: [facility];  Service: Urology;  Laterality: N/A;     reports that he quit smoking about 12 years ago. His smoking use included cigarettes. He has a 40.00 pack-year smoking history. He has never used smokeless tobacco. He reports previous alcohol use. He reports that he does not use drugs.  No Known Allergies  Family History  Problem Relation Age of Onset  . Cancer Mother   . Hypertension Father     . Hypertension Sister   . Hypertension Brother   . Hypertension Sister   . Aneurysm Brother 32       brain  . Colon cancer Neg Hx   . Colon polyps Neg Hx      Prior to Admission medications   Medication Sig Start Date End Date Taking? Authorizing Provider  Amino Acids-Protein Hydrolys (FEEDING SUPPLEMENT, PRO-STAT SUGAR FREE 64,) LIQD Place 30 mLs into feeding tube 2 (two) times daily. 10/09/18   Newman Pies, MD  doxazosin (CARDURA) 1 MG tablet Place 1 tablet (1 mg total) into feeding tube daily. Patient taking differently: Place 1 mg into feeding tube daily at 12 noon.  10/10/18   Newman Pies, MD  HYDROcodone-acetaminophen (NORCO) 10-325 MG tablet Take 1-2 tablets by mouth every 4 (four) hours as needed for moderate pain. Maximum dose per 24 hours - 8 pills 02/25/19   Ihor Gully, MD  Nutritional Supplements (KATE FARMS PEPTIDE 1.5) LIQD 325 mLs by PEG Tube route continuous. Patient taking differently: 325 mLs by PEG Tube route 4 (four) times daily.  11/07/18   Rai, Ripudeep K, MD  pantoprazole (PROTONIX) 40 MG tablet 1 po 30 mins prior to first meal Patient taking differently: Take 40 mg by mouth daily.  12/12/17   Fields, Darleene Cleaver, MD  phenazopyridine (PYRIDIUM) 200 MG tablet Take 1 tablet (200 mg total) by mouth 3 (three) times daily as needed for pain. 02/25/19   Ihor Gully, MD  scopolamine (TRANSDERM-SCOP) 1 MG/3DAYS Place 1 patch (1.5 mg total) onto the skin every 3 (three) days. 11/19/18   [name] A, FNP  simvastatin (ZOCOR) 20 MG tablet Take 1 tablet (20 mg total) by mouth at bedtime. (Needs to be seen before next refill) 01/24/19   [name], FNP  Water For Irrigation, Sterile (FREE WATER) SOLN Place 200 mLs into feeding tube every 4 (four) hours. 11/07/18   Cathren Harsh, MD    Physical Exam: Vitals:   03/08/19 2000 03/08/19 2030 03/08/19 2230 03/08/19 2300  BP: 124/73 (!) 151/92 130/69 121/63  Pulse: 88 90 91 88  Resp:      Temp:      TempSrc:      SpO2: 99% 99%  98% 99%    Constitutional: NAD, calm, comfortable Eyes: PERRL, lids and conjunctivae normal ENMT: Mucous membranes are moist. Posterior pharynx clear of any exudate or lesions.Normal dentition.  Neck: normal, supple, no masses, no thyromegaly Respiratory: clear to auscultation bilaterally, no wheezing, no crackles. Normal respiratory effort. No accessory muscle use.  Cardiovascular: Regular rate and rhythm, no murmurs / rubs / gallops. No extremity edema. 2+ pedal pulses. No carotid bruits.  Abdomen: no tenderness, no masses palpated. No hepatosplenomegaly. Bowel sounds positive.  Musculoskeletal: no clubbing / cyanosis. No joint deformity upper and lower extremities. Good ROM, no contractures. Normal muscle tone.  Skin: no rashes, lesions, ulcers. No induration Neurologic: CN 2-12 grossly intact. Sensation intact, DTR normal. Strength 5/5  in all 4.  Psychiatric: Normal judgment and insight. Alert and oriented x 3. Normal mood.    Labs on Admission: I have personally reviewed following labs and imaging studies  CBC: Recent Labs  Lab 03/08/19 2230  WBC 6.1  NEUTROABS 4.8  HGB 12.5*  HCT 39.9  MCV 90.3  PLT 276   Basic Metabolic Panel: Recent Labs  Lab 03/08/19 2230  NA 141  K 4.1  CL 102  CO2 29  GLUCOSE 105*  BUN 27*  CREATININE 0.83  CALCIUM 9.3   GFR: Estimated Creatinine Clearance: 71.7 mL/min (by C-G formula based on SCr of 0.83 mg/dL). Liver Function Tests: No results for input(s): AST, ALT, ALKPHOS, BILITOT, PROT, ALBUMIN in the last 168 hours. No results for input(s): LIPASE, AMYLASE in the last 168 hours. No results for input(s): AMMONIA in the last 168 hours. Coagulation Profile: No results for input(s): INR, PROTIME in the last 168 hours. Cardiac Enzymes: No results for input(s): CKTOTAL, CKMB, CKMBINDEX, TROPONINI in the last 168 hours. BNP (last 3 results) No results for input(s): PROBNP in the last 8760 hours. HbA1C: No results for input(s): HGBA1C  in the last 72 hours. CBG: No results for input(s): GLUCAP in the last 168 hours. Lipid Profile: No results for input(s): CHOL, HDL, LDLCALC, TRIG, CHOLHDL, LDLDIRECT in the last 72 hours. Thyroid Function Tests: No results for input(s): TSH, T4TOTAL, FREET4, T3FREE, THYROIDAB in the last 72 hours. Anemia Panel: No results for input(s): VITAMINB12, FOLATE, FERRITIN, TIBC, IRON, RETICCTPCT in the last 72 hours. Urine analysis:    Component Value Date/Time   COLORURINE YELLOW 03/30/2015 1330   APPEARANCEUR CLEAR 03/30/2015 1330   LABSPEC >1.030 (H) 03/30/2015 1330   PHURINE 5.5 03/30/2015 1330   GLUCOSEU NEGATIVE 03/30/2015 1330   HGBUR TRACE (A) 03/30/2015 1330   BILIRUBINUR NEGATIVE 03/30/2015 1330   KETONESUR NEGATIVE 03/30/2015 1330   PROTEINUR NEGATIVE 03/30/2015 1330   NITRITE NEGATIVE 03/30/2015 1330   LEUKOCYTESUR NEGATIVE 03/30/2015 1330    Radiological Exams on Admission: No results found.  EKG: Independently reviewed.  Assessment/Plan Active Problems:   PEG (percutaneous endoscopic gastrostomy) adjustment/replacement/removal (HCC)    1. Needs PEG placement - 1. NPO (is NPO at baseline) 2. IVF: NS at 75 to prevent dehydration 3. Morphine PRN since he cant take PO meds (though denying pain right now). 4. IR to put in PEG tomorrow.  DVT prophylaxis: Early ambualtion Code Status: Full Family Communication: No family in room Disposition Plan: Home after PEG placement Consults called: IR called by EDP Admission status: Place in obs   Aiyla Baucom M. DO [facility]  How to contact the [facility] Attending or Consulting provider 7A - 7P or covering provider during after hours 7P -7A, for this patient?  1. Check the care team in [facility] and look for a) attending/consulting [facility] provider listed and b) the [facility] team listed 2. Log into [email or web address]  Amion Physician Scheduling and messaging for groups and whole hospitals  On call and physician scheduling software for  group practices, residents, hospitalists and other medical providers for call, clinic, rotation and shift schedules. On[employer] is a hospital-wide system for scheduling doctors and paging doctors on call. EasyPlot is for scientific plotting and data analysis.  [email or web address]  and use [place]'s universal password to access. If you do not have the password, please contact the hospital operator.  3. Locate the [facility] provider you are looking for under [facility] and page to a number that you can be  directly reached. 4. If you still have difficulty reaching the provider, please page the [facility] (Director on Call) for the Hospitalists listed on amion for assistance.  03/08/2019, 11:27 PM

## 2019-03-08 NOTE — ED Provider Notes (Signed)
I assumed care of patient from Margarita Mail PA-C at shift change, please see her note for full H and P.   Feeding tube was accidentally pulled out this morning.  He takes all intake by tube.     BP (!) 151/92   Pulse 90   Temp 98.2 F (36.8 C) (Oral)   Resp 17   SpO2 99%     ED Course/Procedures     Procedures   Labs Reviewed  BASIC METABOLIC PANEL - Abnormal; Notable for the following components:      Result Value   Glucose, Bld 105 (*)    BUN 27 (*)    All other components within normal limits  CBC WITH DIFFERENTIAL/PLATELET - Abnormal; Notable for the following components:   Hemoglobin 12.5 (*)    Lymphs Abs 0.6 (*)    All other components within normal limits  SARS CORONAVIRUS 2 (TAT 6-24 HRS)     MDM  Plan to follow up on labs, once back admit to hospitalist, will need IR consult for replacement tomorrow.    I spoke with Dr. Alcario Drought who will admit the patient.        Lorin Glass, PA-C 03/08/19 2342    Daleen Bo, MD 03/09/19 1053

## 2019-03-08 NOTE — ED Triage Notes (Signed)
Pt reports was pulling off clothes over an hour ago when feeding tube got pulled out.

## 2019-03-08 NOTE — ED Provider Notes (Signed)
[facility] DEPT Provider Note   CSN: [id number] Arrival date & time: 03/08/19  1556     History Chief Complaint  Patient presents with  . feeding tube pulled out    Logan Price is a 73 y.o. 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  He has a trach and PEG tube.  Patient was taking off his shirt when he accidentally pulled his PEG tube out at about 3 PM, approximately 5 and half hours ago.  He denies any pain.  HPI     Past Medical History:  Diagnosis Date  . Cancer ([place]) 08/2018   trach  . Diverticulitis   . Dysrhythmia 09/2018   episode of SVT while in hospital  . 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 GERD (gastroesophageal reflux disease)   . HTN (hypertension)   . Hyperlipidemia     Patient Active Problem List   Diagnosis Date Noted  . 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 Pleural effusion   . Shortness of breath   . 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[place])   . Bacteremia   . Leukocytosis   . Normocytic anemia   . Protein-calorie malnutrition, severe 10/08/2018  . PSVT (paroxysmal supraventricular tachycardia) ([place]) 10/03/2018  . Atrial fibrillation ([place]) 10/03/2018  . Goals of care, counseling/discussion   . Dysphagia   . Loss of weight   . Squamous cell carcinoma of glottis ([place]) 09/27/2018  . Gastritis determined by endoscopy   . Esophageal dysphagia   . 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 Globus sensation 12/12/2016  . Smokes with greater than 40 pack year history 10/20/2016  . Hemorrhoids, internal, with bleeding 10/06/2015  . History of adenomatous polyp of colon 04/14/2015  . Vitamin D deficiency 10/09/2014  . HLD (hyperlipidemia) 10/08/2012  . GERD (gastroesophageal reflux disease) 10/08/2012  . HTN (hypertension)   . Diverticulitis 10/25/2009    Past Surgical History:  Procedure Laterality Date  . BIOPSY  05/04/2015   Procedure: BIOPSY;  Surgeon: Danie Binder, MD;  [facility];  Service: Endoscopy;;  bx's of ileocecal valve   . COLONOSCOPY WITH PROPOFOL N/A 05/04/2015   Dr. Oneida Alar: normal appearing ileum with prominent IC valve with tubular adenomas, moderate diverticulosis in sigmoid colon, ascending colon, and retum. Moderate sized internal hemorrhoids. Surveillance in 5 years  . ESOPHAGOGASTRODUODENOSCOPY (EGD) WITH PROPOFOL N/A 12/19/2016   Procedure: ESOPHAGOGASTRODUODENOSCOPY (EGD) WITH PROPOFOL;  Surgeon: Danie Binder, MD;  [facility];  Service: Endoscopy;  Laterality: N/A;  11:30am  . FLEXIBLE SIGMOIDOSCOPY N/A 12/10/2015   hemorrhoid banding X 3   . HEMORRHOID BANDING N/A 12/10/2015   Procedure: HEMORRHOID BANDING;  Surgeon: Danie Binder, MD;  [facility];  Service: Endoscopy;  Laterality: N/A;  1:30 PM  . INSERTION OF SUPRAPUBIC CATHETER N/A 02/24/2019   Procedure: INSERTION OF SUPRAPUBIC CATHETER;  Surgeon: Kathie Rhodes, MD;  Location: [facility];  Service: Urology;  Laterality: N/A;  . IR CM INJ ANY COLONIC TUBE W/FLUORO  10/30/2018  . IR GASTROSTOMY TUBE MOD SED  10/04/2018  . IR IMAGING GUIDED PORT INSERTION  10/04/2018  . IR REMOVAL TUN ACCESS W/ PORT W/O FL MOD SED  10/14/2018  . IR REPLACE G-TUBE SIMPLE WO FLUORO  11/03/2018  . LAPAROSCOPIC INSERTION GASTROSTOMY TUBE Left 10/07/2018   Procedure: LAPAROSCOPIC  GASTROSTOMY TUBE;  Surgeon: Kieth Brightly Arta Bruce, MD;  Location: [facility];  Service: General;  Laterality: Left;  Marland Kitchen MICROLARYNGOSCOPY N/A 09/27/2018   Procedure: MICRO DIRECT LARYNGOSCOPY WITH BIOPSY;  Surgeon: Leta Baptist, MD;  Location: [facility];  Service: ENT;  Laterality: N/A;  . None to date     As of 04/14/15  . POLYPECTOMY  05/04/2015   Procedure: POLYPECTOMY;  Surgeon: Danie Binder, MD;  [facility];  Service: Endoscopy;;  descending colon polyp, ascending colon polyp  . SAVORY DILATION N/A 12/19/2016   Procedure: SAVORY DILATION;  Surgeon:  Danie Binder, MD;  [facility];  Service: Endoscopy;  Laterality: N/A;  . TRACHEOSTOMY TUBE PLACEMENT N/A 09/27/2018   Procedure: AWAKE TRACHEOSTOMY;  Surgeon: Leta Baptist, MD;  Location: [facility];  Service: ENT;  Laterality: N/A;  . TRANSURETHRAL RESECTION OF PROSTATE N/A 02/24/2019   Procedure: TRANSURETHRAL RESECTION OF THE PROSTATE (TURP);  Surgeon: Kathie Rhodes, MD;  [facility];  Service: Urology;  Laterality: N/A;       Family History  Problem Relation Age of Onset  . Cancer Mother   . Hypertension Father   . Hypertension Sister   . Hypertension Brother   . Hypertension Sister   . Aneurysm Brother 29       brain  . Colon cancer Neg Hx   . Colon polyps Neg Hx     Social History   Tobacco Use  . Smoking status: Former Smoker    Packs/day: 1.00    Years: 40.00    Pack years: 40.00    Types: Cigarettes    Quit date: 10/09/2006    Years since quitting: 12.4  . Smokeless tobacco: Never Used  . Tobacco comment: quit x 9 years  Substance Use Topics  . Alcohol use: Not Currently    Alcohol/week: 0.0 standard drinks    Comment: he denies 01/06/19  . Drug use: No    Home Medications Prior to Admission medications   Medication Sig Start Date End Date Taking? Authorizing Provider  Amino Acids-Protein Hydrolys (FEEDING SUPPLEMENT, PRO-STAT SUGAR FREE 64,) LIQD Place 30 mLs into feeding tube 2 (two) times daily. 10/09/18   Leta Baptist, MD  ciprofloxacin (CIPRO) 500 MG tablet Take 1 tablet (500 mg total) by mouth 2 (two) times daily. 02/25/19   Kathie Rhodes, MD  doxazosin (CARDURA) 1 MG tablet Place 1 tablet (1 mg total) into feeding tube daily. Patient taking differently: Place 1 mg into feeding tube daily at 12 noon.  10/10/18   Leta Baptist, MD  HYDROcodone-acetaminophen (NORCO) 10-325 MG tablet Take 1-2 tablets by mouth every 4 (four) hours as needed for moderate pain. Maximum dose per 24 hours - 8 pills 02/25/19   Kathie Rhodes, MD  Nutritional Supplements (KATE FARMS  PEPTIDE 1.5) LIQD 325 mLs by PEG Tube route continuous. Patient taking differently: 325 mLs by PEG Tube route 4 (four) times daily.  11/07/18   Rai, Ripudeep K, MD  pantoprazole (PROTONIX) 40 MG tablet 1 po 30 mins prior to first meal Patient taking differently: Take 40 mg by mouth daily.  12/12/17   Fields, Marga Melnick, MD  phenazopyridine (PYRIDIUM) 200 MG tablet Take 1 tablet (200 mg total) by mouth 3 (three) times daily as needed for pain. 02/25/19   Kathie Rhodes, MD  scopolamine (TRANSDERM-SCOP) 1 MG/3DAYS Place 1 patch (1.5 mg total) onto the skin every 3 (three) days. 11/19/18   Evelina Dun A, FNP  simvastatin (ZOCOR) 20 MG tablet Take 1 tablet (20 mg total) by mouth at bedtime. (Needs to  be seen before next refill) 01/24/19   Sharion Balloon, FNP  Water For Irrigation, Sterile (FREE WATER) SOLN Place 200 mLs into feeding tube every 4 (four) hours. 11/07/18   Mendel Corning, MD    Allergies    Patient has no known allergies.  Review of Systems   Review of Systems Ten systems reviewed and are negative for acute change, except as noted in the HPI.   Physical Exam Updated Vital Signs BP 124/73 (BP Location: Left Arm)   Pulse 92   Temp 98.2 F (36.8 C) (Oral)   Resp 17   SpO2 100%   Physical Exam Vitals and nursing note reviewed.  Constitutional:      General: He is not in acute distress.    Appearance: He is well-developed. He is not diaphoretic.  HENT:     Head: Normocephalic and atraumatic.     Mouth/Throat:     Comments: Trach in place Eyes:     General: No scleral icterus.    Conjunctiva/sclera: Conjunctivae normal.  Cardiovascular:     Rate and Rhythm: Normal rate and regular rhythm.     Heart sounds: Normal heart sounds.  Pulmonary:     Effort: Pulmonary effort is normal. No respiratory distress.     Breath sounds: Normal breath sounds.  Abdominal:     Palpations: Abdomen is soft.     Tenderness: There is no abdominal tenderness.     Comments: Ostomy site without  evidence of infection or drainage in the left upper quadrant of the abdomen  Musculoskeletal:     Cervical back: Normal range of motion and neck supple.  Skin:    General: Skin is warm and dry.  Neurological:     Mental Status: He is alert.  Psychiatric:        Behavior: Behavior normal.     ED Results / Procedures / Treatments   Labs (all labs ordered are listed, but only abnormal results are displayed) Labs Reviewed - No data to display  EKG None  Radiology No results found.  Procedures Gastrostomy tube replacement  Date/Time: 03/08/2019 9:47 PM Performed by: Margarita Mail, PA-C Authorized by: Margarita Mail, PA-C  Consent: Verbal consent obtained. Risks and benefits: risks, benefits and alternatives were discussed Consent given by: patient Patient understanding: patient states understanding of the procedure being performed Patient identity confirmed: provided demographic data and verbally with patient Time out: Immediately prior to procedure a "time out" was called to verify the correct patient, procedure, equipment, support staff and site/side marked as required. Preparation: Patient was prepped and draped in the usual sterile fashion. Local anesthesia used: no  Anesthesia: Local anesthesia used: no Comments: Unable to place PEG tube after multiple attempts    (including critical care time)  Medications Ordered in ED Medications - No data to display  ED Course  I have reviewed the triage vital signs and the nursing notes.  Pertinent labs & imaging results that were available during my care of the patient were reviewed by me and considered in my medical decision making (see chart for details).    MDM Rules/Calculators/A&P     CHA2DS2/VAS Stroke Risk Points  Current as of 57 minutes ago     2 >= 2 Points: High Risk  1 - 1.99 Points: Medium Risk  0 Points: Low Risk    The patient's score has not changed in the past year.: No Change     Details    This  score determines the  patient's risk of having a stroke if the  patient has atrial fibrillation.       Points Metrics  0 Has Congestive Heart Failure:  No    Current as of 57 minutes ago  0 Has Vascular Disease:  No    Current as of 57 minutes ago  1 Has Hypertension:  Yes    Current as of 57 minutes ago  1 Age:  8    Current as of 57 minutes ago  0 Has Diabetes:  No    Current as of 57 minutes ago  0 Had Stroke:  No  Had TIA:  No  Had thromboembolism:  No    Current as of 57 minutes ago  0 Male:  No    Current as of 57 minutes ago                         73 year old male here because his PEG tube fell out.  This is where he gets all of his nutrition and medication.  I spoke with Dr. Earleen Newport of interventional radiology.  He states that they can get the patient taken care of sometime tomorrow as a work in.  He will need to be admitted to the hospitalist service.  Lab work and [employer] test are pending.  I discussed the case with Wyn Quaker, PA who will assume care of the patient. Final Clinical Impression(s) / ED Diagnoses Final diagnoses:  None    Rx / DC Orders ED Discharge Orders    None       Margarita Mail, PA-C 03/08/19 2148    Julianne Rice, MD 03/09/19 1954

## 2019-03-09 ENCOUNTER — Encounter (HOSPITAL_COMMUNITY): Payer: Self-pay | Admitting: Internal Medicine

## 2019-03-09 DIAGNOSIS — E785 Hyperlipidemia, unspecified: Secondary | ICD-10-CM | POA: Diagnosis present

## 2019-03-09 DIAGNOSIS — K9423 Gastrostomy malfunction: Secondary | ICD-10-CM | POA: Diagnosis not present

## 2019-03-09 DIAGNOSIS — Z8521 Personal history of malignant neoplasm of larynx: Secondary | ICD-10-CM | POA: Diagnosis not present

## 2019-03-09 DIAGNOSIS — Z87891 Personal history of nicotine dependence: Secondary | ICD-10-CM | POA: Diagnosis not present

## 2019-03-09 DIAGNOSIS — Z8249 Family history of ischemic heart disease and other diseases of the circulatory system: Secondary | ICD-10-CM | POA: Diagnosis not present

## 2019-03-09 DIAGNOSIS — Z20828 Contact with and (suspected) exposure to other viral communicable diseases: Secondary | ICD-10-CM | POA: Diagnosis present

## 2019-03-09 DIAGNOSIS — K219 Gastro-esophageal reflux disease without esophagitis: Secondary | ICD-10-CM | POA: Diagnosis present

## 2019-03-09 DIAGNOSIS — Z79899 Other long term (current) drug therapy: Secondary | ICD-10-CM | POA: Diagnosis not present

## 2019-03-09 DIAGNOSIS — Z431 Encounter for attention to gastrostomy: Secondary | ICD-10-CM | POA: Diagnosis present

## 2019-03-09 DIAGNOSIS — Z930 Tracheostomy status: Secondary | ICD-10-CM | POA: Diagnosis not present

## 2019-03-09 LAB — SARS CORONAVIRUS 2 (TAT 6-24 HRS): SARS Coronavirus 2: NEGATIVE

## 2019-03-09 NOTE — ED Notes (Signed)
Pt called out needing to use RR.  Pt provided with a urinal.

## 2019-03-09 NOTE — Progress Notes (Addendum)
Patient ID: Logan Price, male   DOB: 1945-04-02, 73 y.o.   MRN: [id number]   IR aware of request for G tube eval/replacement  Likely unable to perform today secondary many emergent cases in IR.  I have spoken to RN- She is aware  Will try to get pt down tomorrow to IR for procedure

## 2019-03-09 NOTE — Progress Notes (Signed)
PROGRESS NOTE    Logan Price  XLK:[id number] DOB: [birth year] DOA: 03/08/2019 PCP: [name], FNP   Brief Narrative: (Start on day 1 of progress note - keep it brief and live) Logan Price is a 73 y.o. male with medical history significant of head and neck CA, with trach and PEG.  Accidentally pulled out PEG tube when taking off a shirt at 3pm.  No pain.  Takes all meds and nutrition through PEG tube though.   ED Course: EDP unable to replace PEG.  IR consulted and they will work patient in tomorrow for PEG placement.  Hospitalist asked to admit in the mean time.   Assessment & Plan:   Active Problems:   PEG (percutaneous endoscopic gastrostomy) adjustment/replacement/removal (HCC)  1. Needs PEG placement - 1. NPO (is NPO at baseline) 2. IVF: Continue NS at 75 to prevent dehydration 3. Morphine PRN since he cant take PO meds but currently has no pain. 4. IR to put in PEG tomorrow, could not do it today due to emergent cases.  5. Trach collar in place, continue trach care per RT.   DVT prophylaxis: Ambulation. Code Status: Full Family Communication: Discussed with the patient, he will contact his family. Disposition Plan: Admitted as inpatient with anticipated at least stay with the risk of dehydration and electrolyte derangement if he is not treated as inpatient.    Consultants:   IR   Procedures:   Awaiting PEG tube placemen by IR.    Antimicrobials:  None  Subjective: Patient denies abdominal pain. Denies dizziness. Reports that he had his PEG tube since July and had it accidentally removed before.   Objective: Vitals:   03/09/19 0629 03/09/19 0820 03/09/19 1152 03/09/19 1408  BP: 128/86   (!) 118/59  Pulse: 88 87 83 96  Resp: 17 15 15  (!) 21  Temp: 97.7 F (36.5 C)   98.3 F (36.8 C)  TempSrc: Oral   Oral  SpO2: 97% 97% 99% 100%  Weight:      Height:        Intake/Output Summary (Last 24 hours) at 03/09/2019 1551 Last data  filed at 03/09/2019 1334 Gross per 24 hour  Intake 709.29 ml  Output 300 ml  Net 409.29 ml   Filed Weights   03/09/19 0054  Weight: 60.3 kg    Examination:  General exam: Appears calm and comfortable  Respiratory system: Clear to auscultation. Respiratory effort normal. Cardiovascular system: S1 & S2 heard, RRR. No JVD, murmurs, rubs, gallops or clicks. No pedal edema. Gastrointestinal system: Abdomen is nondistended, soft and nontender. No organomegaly or masses felt. Normal bowel sounds heard. Central nervous system: Alert and oriented. No focal neurological deficits. Extremities: Symmetric 5 x 5 power. Skin: No rashes, lesions or ulcers Psychiatry: Judgement and insight appear normal. Mood & affect appropriate.     Data Reviewed: I have personally reviewed following labs and imaging studies  CBC: Recent Labs  Lab 03/08/19 2230  WBC 6.1  NEUTROABS 4.8  HGB 12.5*  HCT 39.9  MCV 90.3  PLT 276   Basic Metabolic Panel: Recent Labs  Lab 03/08/19 2230  NA 141  K 4.1  CL 102  CO2 29  GLUCOSE 105*  BUN 27*  CREATININE 0.83  CALCIUM 9.3   GFR: Estimated Creatinine Clearance: 68.6 mL/min (by C-G formula based on SCr of 0.83 mg/dL). Liver Function Tests: No results for input(s): AST, ALT, ALKPHOS, BILITOT, PROT, ALBUMIN in the last 168 hours. No results  for input(s): LIPASE, AMYLASE in the last 168 hours. No results for input(s): AMMONIA in the last 168 hours. Coagulation Profile: No results for input(s): INR, PROTIME in the last 168 hours. Cardiac Enzymes: No results for input(s): CKTOTAL, CKMB, CKMBINDEX, TROPONINI in the last 168 hours. BNP (last 3 results) No results for input(s): PROBNP in the last 8760 hours. HbA1C: No results for input(s): HGBA1C in the last 72 hours. CBG: No results for input(s): GLUCAP in the last 168 hours. Lipid Profile: No results for input(s): CHOL, HDL, LDLCALC, TRIG, CHOLHDL, LDLDIRECT in the last 72 hours. Thyroid Function  Tests: No results for input(s): TSH, T4TOTAL, FREET4, T3FREE, THYROIDAB in the last 72 hours. Anemia Panel: No results for input(s): VITAMINB12, FOLATE, FERRITIN, TIBC, IRON, RETICCTPCT in the last 72 hours. Sepsis Labs: No results for input(s): PROCALCITON, LATICACIDVEN in the last 168 hours.  Recent Results (from the past 240 hour(s))  SARS CORONAVIRUS 2 (TAT 6-24 HRS) Nasopharyngeal Nasopharyngeal Swab     Status: None   Collection Time: 03/08/19 10:30 PM   Specimen: Nasopharyngeal Swab  Result Value Ref Range Status   SARS Coronavirus 2 NEGATIVE NEGATIVE Final    Comment: (NOTE) SARS-CoV-2 target nucleic acids are NOT DETECTED. The SARS-CoV-2 RNA is generally detectable in upper and lower respiratory specimens during the acute phase of infection. Negative results do not preclude SARS-CoV-2 infection, do not rule out co-infections with other pathogens, and should not be used as the sole basis for treatment or other patient management decisions. Negative results must be combined with clinical observations, patient history, and epidemiological information. The expected result is Negative. Fact Sheet for Patients: HairSlick.no Fact Sheet for Healthcare Providers: quierodirigir.com This test is not yet approved or cleared by the [place] FDA and  has been authorized for detection and/or diagnosis of SARS-CoV-2 by FDA under an Emergency Use Authorization (EUA). This EUA will remain  in effect (meaning this test can be used) for the duration of the COVID-19 declaration under Section 56 4(b)(1) of the Act, 21 U.S.C. section 360bbb-3(b)(1), unless the authorization is terminated or revoked sooner. Performed at [facility], [address] [place], [place] [address]          Radiology Studies: No results found.      Scheduled Meds: Continuous Infusions: . sodium chloride Stopped (03/09/19 1235)     LOS:  0 days    Time spent: 25 minutes    Ky Barban, MD [facility]   If 7PM-7AM, please contact night-coverage [email or web address] Password [facility] 03/09/2019, 3:51 PM

## 2019-03-10 ENCOUNTER — Inpatient Hospital Stay (HOSPITAL_COMMUNITY): Payer: Medicare Other

## 2019-03-10 ENCOUNTER — Telehealth: Payer: Self-pay | Admitting: *Deleted

## 2019-03-10 LAB — BASIC METABOLIC PANEL
Anion gap: 10 (ref 5–15)
BUN: 22 mg/dL (ref 8–23)
CO2: 24 mmol/L (ref 22–32)
Calcium: 8.7 mg/dL — ABNORMAL LOW (ref 8.9–10.3)
Chloride: 109 mmol/L (ref 98–111)
Creatinine, Ser: 0.8 mg/dL (ref 0.61–1.24)
GFR calc Af Amer: >60 mL/min (ref >60)
GFR calc non Af Amer: >60 mL/min (ref >60)
Glucose, Bld: 78 mg/dL (ref 70–99)
Potassium: 4.4 mmol/L (ref 3.5–5.1)
Sodium: 143 mmol/L (ref 135–145)

## 2019-03-10 MED ORDER — IOHEXOL 300 MG/ML  SOLN
50.0000 mL | Freq: Once | INTRAMUSCULAR | Status: AC | PRN
  Administered 2019-03-10: 10 mL

## 2019-03-10 NOTE — Progress Notes (Signed)
Attempted to check on pt, but pt gone to IR for procedure. Respiratory will continue to monitor.

## 2019-03-10 NOTE — Progress Notes (Signed)
PROGRESS NOTE    Logan Price  ZOX:[id number] DOB: 22-Jan-1946 DOA: 03/08/2019 PCP: [name], FNP   Brief Narrative: (Start on day 1 of progress note - keep it brief and live) Logan Price is a 73 y.o. male with medical history significant of head and neck CA, with trach and PEG.  Accidentally pulled out PEG tube when taking off a shirt at 3pm.  No pain.  Takes all meds and nutrition through PEG tube though.   ED Course: EDP unable to replace PEG.  IR consulted and they will work patient in tomorrow for PEG placement.  Hospitalist asked to admit in the mean time.   Assessment & Plan:   Active Problems:   PEG (percutaneous endoscopic gastrostomy) adjustment/replacement/removal (HCC)  1. Needs PEG placement - 1. NPO (is NPO at baseline) 2. IVF: Continue NS at 75 to prevent dehydration 3. Morphine PRN since he cant take PO meds but currently has no pain. 4. IR replacing PEG tube later today, will need tube feeds afterwards, dietitians consulted.  5. Trach collar in place, continue trach care per RT.   DVT prophylaxis: Ambulation. Code Status: Full Family Communication: Discussed with the patient, he will contact his family. Disposition Plan: Admitted as inpatient with anticipated at least stay with the risk of dehydration and electrolyte derangement if he is not treated as inpatient.    Consultants:   IR   Procedures:   Awaiting PEG tube placement by IR.    Antimicrobials:  None  Subjective: Patient seen in the early afternoon. Denied Abdominal pain or dizziness.   Objective: Vitals:   03/10/19 0845 03/10/19 1253 03/10/19 1910 03/10/19 2019  BP:  (!) 144/85  (!) 158/83  Pulse:  83 96 89  Resp:  15 16 16   Temp:  97.8 F (36.6 C)  98.1 F (36.7 C)  TempSrc:  Oral  Oral  SpO2: 98% 97% 95% 99%  Weight:      Height:        Intake/Output Summary (Last 24 hours) at 03/10/2019 2051 Last data filed at 03/10/2019 2021 Gross per 24 hour    Intake --  Output 550 ml  Net -550 ml   Filed Weights   03/09/19 0054  Weight: 60.3 kg    Examination:  General exam: Appears calm and comfortable  Respiratory system: Clear to auscultation. Respiratory effort normal. Trach collar in place.  Cardiovascular system: S1 & S2 heard, RRR. No pedal edema. Gastrointestinal system: Abdomen is nondistended, soft and nontender. Central nervous system: Alert and oriented. No focal neurological deficits. Extremities: Symmetric 5 x 5 power. Skin: No rashes, lesions or ulcers Psychiatry: Judgement and insight appear normal. Mood & affect appropriate.     Data Reviewed: I have personally reviewed following labs and imaging studies  CBC: Recent Labs  Lab 03/08/19 2230  WBC 6.1  NEUTROABS 4.8  HGB 12.5*  HCT 39.9  MCV 90.3  PLT 276   Basic Metabolic Panel: Recent Labs  Lab 03/08/19 2230 03/10/19 0458  NA 141 143  K 4.1 4.4  CL 102 109  CO2 29 24  GLUCOSE 105* 78  BUN 27* 22  CREATININE 0.83 0.80  CALCIUM 9.3 8.7*   GFR: Estimated Creatinine Clearance: 71.2 mL/min (by C-G formula based on SCr of 0.8 mg/dL). Liver Function Tests: No results for input(s): AST, ALT, ALKPHOS, BILITOT, PROT, ALBUMIN in the last 168 hours. No results for input(s): LIPASE, AMYLASE in the last 168 hours. No results for input(s):  AMMONIA in the last 168 hours. Coagulation Profile: No results for input(s): INR, PROTIME in the last 168 hours. Cardiac Enzymes: No results for input(s): CKTOTAL, CKMB, CKMBINDEX, TROPONINI in the last 168 hours. BNP (last 3 results) No results for input(s): PROBNP in the last 8760 hours. HbA1C: No results for input(s): HGBA1C in the last 72 hours. CBG: No results for input(s): GLUCAP in the last 168 hours. Lipid Profile: No results for input(s): CHOL, HDL, LDLCALC, TRIG, CHOLHDL, LDLDIRECT in the last 72 hours. Thyroid Function Tests: No results for input(s): TSH, T4TOTAL, FREET4, T3FREE, THYROIDAB in the last 72  hours. Anemia Panel: No results for input(s): VITAMINB12, FOLATE, FERRITIN, TIBC, IRON, RETICCTPCT in the last 72 hours. Sepsis Labs: No results for input(s): PROCALCITON, LATICACIDVEN in the last 168 hours.  Recent Results (from the past 240 hour(s))  SARS CORONAVIRUS 2 (TAT 6-24 HRS) Nasopharyngeal Nasopharyngeal Swab     Status: None   Collection Time: 03/08/19 10:30 PM   Specimen: Nasopharyngeal Swab  Result Value Ref Range Status   SARS Coronavirus 2 NEGATIVE NEGATIVE Final    Comment: (NOTE) SARS-CoV-2 target nucleic acids are NOT DETECTED. The SARS-CoV-2 RNA is generally detectable in upper and lower respiratory specimens during the acute phase of infection. Negative results do not preclude SARS-CoV-2 infection, do not rule out co-infections with other pathogens, and should not be used as the sole basis for treatment or other patient management decisions. Negative results must be combined with clinical observations, patient history, and epidemiological information. The expected result is Negative. Fact Sheet for Patients: HairSlick.no Fact Sheet for Healthcare Providers: quierodirigir.com This test is not yet approved or cleared by the [place] FDA and  has been authorized for detection and/or diagnosis of SARS-CoV-2 by FDA under an Emergency Use Authorization (EUA). This EUA will remain  in effect (meaning this test can be used) for the duration of the COVID-19 declaration under Section 56 4(b)(1) of the Act, 21 U.S.C. section 360bbb-3(b)(1), unless the authorization is terminated or revoked sooner. Performed at [facility], [address] [place], [place] [address]          Radiology Studies: IR REPLACE G-TUBE COMPLEX WO FLUORO (TRACT REV)  Result Date: 03/10/2019 INDICATION: 73 year old male with a chronic indwelling percutaneous gastrostomy tube which was inadvertently displaced. He presents for  tube reinsertion. EXAM: Gastrostomy tube exchange MEDICATIONS: None ANESTHESIA/SEDATION: None CONTRAST:  10 mL Omnipaque 300-administered into the gastric lumen. FLUOROSCOPY TIME:  Fluoroscopy Time: 0 minutes 18 seconds (2 mGy). COMPLICATIONS: None immediate. PROCEDURE: Informed written consent was obtained from the patient after a thorough discussion of the procedural risks, benefits and alternatives. All questions were addressed. A timeout was performed prior to the initiation of the procedure. The prior tube entry site was cleaned of debris. Utilizing a 14 French dilator, an Amplatz wire was successfully navigated into the gastric lumen. The dilator was then used to dilate the soft tissue tract. A 14 French Entuit balloon retention percutaneous gastrostomy tube was then lubricated and advanced through the tract. The balloon was inflated with 7 cc of saline. Contrast was injected through the gastrostomy tube confirming the tubes location within the stomach. A T tack is visible. IMPRESSION: Successful replacement of a new 14 French balloon retention percutaneous gastrostomy tube. The tube is located within the stomach and ready for immediate use. Electronically Signed   By: Malachy Moan M.D.   On: 03/10/2019 16:16        Scheduled Meds: Continuous Infusions: .  sodium chloride 75 mL/hr at 03/09/19 1752     LOS: 1 day    Time spent: 25 minutes    Ky Barban, MD [facility]   If 7PM-7AM, please contact night-coverage [email or web address] Password [facility] 03/10/2019, 8:51 PM

## 2019-03-10 NOTE — Telephone Encounter (Signed)
Called patient to inform that  scans and fu need to be rescheduled due to him being in the hsopital, spoke with patient's wife and she informed me that her husband should be home tomorrow, I told her that I would call her tomorrow to get his in-put

## 2019-03-11 ENCOUNTER — Ambulatory Visit (HOSPITAL_COMMUNITY): Payer: Medicare Other

## 2019-03-11 ENCOUNTER — Encounter (HOSPITAL_COMMUNITY): Payer: Medicare Other

## 2019-03-11 NOTE — Progress Notes (Signed)
Initial Nutrition Assessment  DOCUMENTATION CODES:   Severe malnutrition in context of chronic illness  INTERVENTION:  - monitor for discharge.   NUTRITION DIAGNOSIS:   Severe Malnutrition related to chronic illness, cancer and cancer related treatments as evidenced by severe fat depletion, severe muscle depletion  GOAL:   Patient will meet greater than or equal to 90% of their needs  MONITOR:   TF tolerance, Labs, Weight trends  REASON FOR ASSESSMENT:   Consult Enteral/tube feeding initiation and management  ASSESSMENT:   73 y.o. male with medical history significant of head and neck cancer with trach and PEG. He accidentally pulled out PEG tube when taking off a shirt. He receives all medications and all nutrition through PEG. ED provider unable to place PEG so it was replaced by IR and patient kept overnight for monitoring.  Patient is NPO. He does not consume anything PO at home. Patient had PEG replaced yesterday. He follows with RD at [facility]. He pours 3 cartons of [employer] Peptide 1.5 into a bag and lets it run via gravity and then pours a fourth carton in around noon. He also administers 30 ml prostat BID and 240 ml free water QID. This regimen provides 2200 kcal, 126 grams protein.   Patient reports that he is going home this AM and that he does not need his TF ordered here. Talked with RN and she reports that plan is for d/c home today but unsure of time for this.   Will not place TF order right now, but if patient has not discharged by this afternoon, will place TF order at that time.    Labs reviewed; Ca: 8.7 mg/dl. Medications reviewed. IVF; NS @ 75 ml/hr.    NUTRITION - FOCUSED PHYSICAL EXAM:    Most Recent Value  Orbital Region  Moderate depletion  Upper Arm Region  Severe depletion  Thoracic and Lumbar Region  Severe depletion  Buccal Region  Moderate depletion  Temple Region  Moderate depletion  Clavicle Bone Region  Severe depletion  Clavicle and  Acromion Bone Region  Severe depletion  Scapular Bone Region  Moderate depletion  Dorsal Hand  Moderate depletion  Patellar Region  Moderate depletion  Anterior Thigh Region  Moderate depletion  Posterior Calf Region  Severe depletion  Edema (RD Assessment)  None  Hair  Reviewed  Eyes  Reviewed  Mouth  Reviewed  Skin  Reviewed  Nails  Reviewed       Diet Order:   Diet Order            Diet NPO time specified  Diet effective now              EDUCATION NEEDS:   No education needs have been identified at this time  Skin:  Skin Assessment: Reviewed RN Assessment(abdominal incision from 11/30)  Last BM:  PTA/unknown  Height:   Ht Readings from Last 1 Encounters:  03/09/19 5\' 9"  (1.753 m)    Weight:   Wt Readings from Last 1 Encounters:  03/09/19 60.3 kg    Ideal Body Weight:  72.7 kg  BMI:  Body mass index is 19.63 kg/m.  Estimated Nutritional Needs:   Kcal:  1610-9604 kcal  Protein:  115-130 grams  Fluid:  >/= 2 L/day     Jarome Matin, MS, RD, LDN, [facility] Inpatient Clinical Dietitian Pager # [phone] After hours/weekend pager # [phone]

## 2019-03-11 NOTE — Discharge Summary (Addendum)
Physician Discharge Summary  EUSTAQUIO BOLOTIN UJW:[id number] DOB: 01-Dec-1945 DOA: 03/08/2019  PCP: [name], FNP  Admit date: 03/08/2019 Discharge date: 03/11/2019  Admitted From: Home  Disposition: Home   Recommendations for Outpatient Follow-up:  1. Follow up with specialist today as previously scheduled.   Home Health: No Equipment/Devices: No  Discharge Condition: Stable CODE STATUS: Full Diet recommendation: NPO, on tube feeds  Brief/Interim Summary: Abdoul G Tatumis a 73 y.o.malewith medical history significant ofhead and neck CA, with trach and PEG. Accidentally pulled out PEG tube when taking off a shirt at 3pm.  No pain.  Takes all meds and nutrition through PEG tube though.   ED Course:EDP unable to replace PEG.  IR consulted and they will work patient in tomorrow for PEG placement. Hospitalist asked to admit in the mean time.  Hospital Course:   1. Needs PEG placement - 1. NPO (is NPO at baseline) 2. IVF: Patient treated with NS at 75 to prevent dehydration, will continue PEG tube feeds after discharge.  3. IR replacing PEG tube on 12/14 and it is ready for use.  4. Trach collar in place, continue trach care at home, patient will follow up with specialist today.    Discharge Diagnoses:  Active Problems:   PEG (percutaneous endoscopic gastrostomy) adjustment/replacement/removal [facility])    Discharge Instructions  Discharge Instructions    Discharge diet:   Complete by: As directed    NPO   Increase activity slowly   Complete by: As directed      Allergies as of 03/11/2019   No Known Allergies     Medication List    TAKE these medications   doxazosin 1 MG tablet Commonly known as: CARDURA Place 1 tablet (1 mg total) into feeding tube daily. What changed: when to take this   feeding supplement (PRO-STAT SUGAR FREE 64) Liqd Place 30 mLs into feeding tube 2 (two) times daily.   free water Soln Place 200 mLs into feeding tube  every 4 (four) hours.   HYDROcodone-acetaminophen 10-325 MG tablet Commonly known as: NORCO Take 1-2 tablets by mouth every 4 (four) hours as needed for moderate pain. Maximum dose per 24 hours - 8 pills   Molli Posey Peptide 1.5 Liqd 325 mLs by PEG Tube route continuous. What changed: when to take this   pantoprazole 40 MG tablet Commonly known as: Protonix 1 po 30 mins prior to first meal What changed:   how much to take  how to take this  when to take this  additional instructions   phenazopyridine 200 MG tablet Commonly known as: Pyridium Take 1 tablet (200 mg total) by mouth 3 (three) times daily as needed for pain.   scopolamine 1 MG/3DAYS Commonly known as: TRANSDERM-SCOP Place 1 patch (1.5 mg total) onto the skin every 3 (three) days.   simvastatin 20 MG tablet Commonly known as: ZOCOR Take 1 tablet (20 mg total) by mouth at bedtime. (Needs to be seen before next refill)       No Known Allergies  Consultations:  IR   Procedures/Studies: IR REPLACE G-TUBE COMPLEX WO FLUORO (TRACT REV)  Result Date: 03/10/2019 INDICATION: 73 year old male with a chronic indwelling percutaneous gastrostomy tube which was inadvertently displaced. He presents for tube reinsertion. EXAM: Gastrostomy tube exchange MEDICATIONS: None ANESTHESIA/SEDATION: None CONTRAST:  10 mL Omnipaque 300-administered into the gastric lumen. FLUOROSCOPY TIME:  Fluoroscopy Time: 0 minutes 18 seconds (2 mGy). COMPLICATIONS: None immediate. PROCEDURE: Informed written consent was obtained from the patient after  a thorough discussion of the procedural risks, benefits and alternatives. All questions were addressed. A timeout was performed prior to the initiation of the procedure. The prior tube entry site was cleaned of debris. Utilizing a 14 French dilator, an Amplatz wire was successfully navigated into the gastric lumen. The dilator was then used to dilate the soft tissue tract. A 14 French Entuit  balloon retention percutaneous gastrostomy tube was then lubricated and advanced through the tract. The balloon was inflated with 7 cc of saline. Contrast was injected through the gastrostomy tube confirming the tubes location within the stomach. A T tack is visible. IMPRESSION: Successful replacement of a new 14 French balloon retention percutaneous gastrostomy tube. The tube is located within the stomach and ready for immediate use. Electronically Signed   By: Malachy Moan M.D.   On: 03/10/2019 16:16    IR PEG tube reinsertion    Subjective: Patient with no abdominal pain. Has no complaints. Has a very important appointment with specialist today at 3:30 pm, eager to be discharged.   Discharge Exam: Vitals:   03/11/19 0537 03/11/19 0711  BP: 127/79   Pulse: 88 89  Resp: 16 16  Temp: 98.1 F (36.7 C)   SpO2: 99% 99%   Vitals:   03/11/19 0259 03/11/19 0300 03/11/19 0537 03/11/19 0711  BP:   127/79   Pulse: 80 80 88 89  Resp: 16 16 16 16   Temp:   98.1 F (36.7 C)   TempSrc:   Oral   SpO2: 95% 95% 99% 99%  Weight:      Height:        General: Pt is alert, awake, not in acute distress Cardiovascular: RRR Respiratory: No respiratory distress, trach collar in place Abdominal: Soft, NT, ND, PEG tube in place Extremities: no edema, no cyanosis    The results of significant diagnostics from this hospitalization (including imaging, microbiology, ancillary and laboratory) are listed below for reference.     Microbiology: Recent Results (from the past 240 hour(s))  SARS CORONAVIRUS 2 (TAT 6-24 HRS) Nasopharyngeal Nasopharyngeal Swab     Status: None   Collection Time: 03/08/19 10:30 PM   Specimen: Nasopharyngeal Swab  Result Value Ref Range Status   SARS Coronavirus 2 NEGATIVE NEGATIVE Final    Comment: (NOTE) SARS-CoV-2 target nucleic acids are NOT DETECTED. The SARS-CoV-2 RNA is generally detectable in upper and lower respiratory specimens during the acute phase of  infection. Negative results do not preclude SARS-CoV-2 infection, do not rule out co-infections with other pathogens, and should not be used as the sole basis for treatment or other patient management decisions. Negative results must be combined with clinical observations, patient history, and epidemiological information. The expected result is Negative. Fact Sheet for Patients: HairSlick.no Fact Sheet for Healthcare Providers: quierodirigir.com This test is not yet approved or cleared by the [place] FDA and  has been authorized for detection and/or diagnosis of SARS-CoV-2 by FDA under an Emergency Use Authorization (EUA). This EUA will remain  in effect (meaning this test can be used) for the duration of the COVID-19 declaration under Section 56 4(b)(1) of the Act, 21 U.S.C. section 360bbb-3(b)(1), unless the authorization is terminated or revoked sooner. Performed at [facility], [address], [place], [place] [address]      Labs: BNP (last 3 results) No results for input(s): BNP in the last 8760 hours. Basic Metabolic Panel: Recent Labs  Lab 03/08/19 2230 03/10/19 0458  NA 141 143  K  4.1 4.4  CL 102 109  CO2 29 24  GLUCOSE 105* 78  BUN 27* 22  CREATININE 0.83 0.80  CALCIUM 9.3 8.7*   Liver Function Tests: No results for input(s): AST, ALT, ALKPHOS, BILITOT, PROT, ALBUMIN in the last 168 hours. No results for input(s): LIPASE, AMYLASE in the last 168 hours. No results for input(s): AMMONIA in the last 168 hours. CBC: Recent Labs  Lab 03/08/19 2230  WBC 6.1  NEUTROABS 4.8  HGB 12.5*  HCT 39.9  MCV 90.3  PLT 276   Cardiac Enzymes: No results for input(s): CKTOTAL, CKMB, CKMBINDEX, TROPONINI in the last 168 hours. BNP: Invalid input(s): POCBNP CBG: No results for input(s): GLUCAP in the last 168 hours. D-Dimer No results for input(s): DDIMER in the last 72 hours. Hgb A1c No results for  input(s): HGBA1C in the last 72 hours. Lipid Profile No results for input(s): CHOL, HDL, LDLCALC, TRIG, CHOLHDL, LDLDIRECT in the last 72 hours. Thyroid function studies No results for input(s): TSH, T4TOTAL, T3FREE, THYROIDAB in the last 72 hours.  Invalid input(s): FREET3 Anemia work up No results for input(s): VITAMINB12, FOLATE, FERRITIN, TIBC, IRON, RETICCTPCT in the last 72 hours. Urinalysis    Component Value Date/Time   COLORURINE YELLOW 03/30/2015 1330   APPEARANCEUR CLEAR 03/30/2015 1330   LABSPEC >1.030 (H) 03/30/2015 1330   PHURINE 5.5 03/30/2015 1330   GLUCOSEU NEGATIVE 03/30/2015 1330   HGBUR TRACE (A) 03/30/2015 1330   BILIRUBINUR NEGATIVE 03/30/2015 1330   KETONESUR NEGATIVE 03/30/2015 1330   PROTEINUR NEGATIVE 03/30/2015 1330   NITRITE NEGATIVE 03/30/2015 1330   LEUKOCYTESUR NEGATIVE 03/30/2015 1330   Sepsis Labs Invalid input(s): PROCALCITONIN,  WBC,  LACTICIDVEN Microbiology Recent Results (from the past 240 hour(s))  SARS CORONAVIRUS 2 (TAT 6-24 HRS) Nasopharyngeal Nasopharyngeal Swab     Status: None   Collection Time: 03/08/19 10:30 PM   Specimen: Nasopharyngeal Swab  Result Value Ref Range Status   SARS Coronavirus 2 NEGATIVE NEGATIVE Final    Comment: (NOTE) SARS-CoV-2 target nucleic acids are NOT DETECTED. The SARS-CoV-2 RNA is generally detectable in upper and lower respiratory specimens during the acute phase of infection. Negative results do not preclude SARS-CoV-2 infection, do not rule out co-infections with other pathogens, and should not be used as the sole basis for treatment or other patient management decisions. Negative results must be combined with clinical observations, patient history, and epidemiological information. The expected result is Negative. Fact Sheet for Patients: HairSlick.no Fact Sheet for Healthcare Providers: quierodirigir.com This test is not yet approved or  cleared by the [place] FDA and  has been authorized for detection and/or diagnosis of SARS-CoV-2 by FDA under an Emergency Use Authorization (EUA). This EUA will remain  in effect (meaning this test can be used) for the duration of the COVID-19 declaration under Section 56 4(b)(1) of the Act, 21 U.S.C. section 360bbb-3(b)(1), unless the authorization is terminated or revoked sooner. Performed at [facility], [address] [place], [place] [address]      Time coordinating discharge: Over 33 minutes  SIGNED:   Ky Barban, MD  [facility] 03/11/2019, 10:54 AM   If 7PM-7AM, please contact night-coverage [email or web address] Password [email or web address]

## 2019-03-11 NOTE — Progress Notes (Signed)
This RN spoke with the patient's wife and she wants the MD to call her. Will notify MD.

## 2019-03-12 ENCOUNTER — Ambulatory Visit: Payer: PRIVATE HEALTH INSURANCE | Admitting: Radiation Oncology

## 2019-03-26 ENCOUNTER — Ambulatory Visit (HOSPITAL_COMMUNITY)
Admission: RE | Admit: 2019-03-26 | Discharge: 2019-03-26 | Disposition: A | Discharge status: Home / Self Care | Payer: Medicare Other | Source: Ambulatory Visit | Attending: Family | Admitting: Family

## 2019-03-26 ENCOUNTER — Other Ambulatory Visit: Payer: Self-pay

## 2019-03-26 DIAGNOSIS — C329 Malignant neoplasm of larynx, unspecified: Secondary | ICD-10-CM

## 2019-03-26 DIAGNOSIS — R1310 Dysphagia, unspecified: Secondary | ICD-10-CM

## 2019-03-26 DIAGNOSIS — R1319 Other dysphagia: Secondary | ICD-10-CM

## 2019-03-26 NOTE — Progress Notes (Signed)
Modified Barium Swallow Progress Note  Patient Details  Name: Logan Price MRN: [id number] Date of Birth: [birth year]  Today's Date: 03/26/2019  Modified Barium Swallow completed.  Full report located under Chart Review in the Imaging Section.  Brief recommendations include the following:  Clinical Impression  Pt presents with moderate oropharyngeal dysphagia with resultant aspiration of thin liquid and laryngeal penetration of nectar-thick and honey-thick liquid on today's examination.  Pt with noted improvement from previous MBS on 10/29/2018.  Aspiration and laryngeal penetration were secondary to decreased laryngeal closure and delayed swallow initiation at the level of the pyriform sinuses.  Trace aspiration of thin liquid was silent; however, pt was able to clear his laryngeal vestibule effectively with a cued throat clear.  Pt exhibited laryngeal penetration to his vocal folds with nectar-thick liquid that he was sensate to and partially cleared with a throat clear, however, some residual liquid remained in his laryngeal vestibule.  No laryngeal penetration was observed with honey-thick liquid before/during the swallow, but he had trace vallecular and pyriform residue that spilled into his laryngeal vestibule after the swallow when fluoro was turned on for next po trial.  No laryngeal penetration or aspiration was observed with puree or a small regular solid trial.  Of note, pt had his PMV donned for all po trials during this evaluation.  Oral phase was remarkable for reduced lingual control resulting in premature spillage to the pharynx and reduced lingual strength resulting in trace oral residue.  Pharyngeal phase was remarkable for reduced BOT retraction resulting in trace vallecular residue, reduced hyolaryngeal elevation and excursion resulting in trace vallecular and pyriform residue, and reduced pharyngeal constriction resulting in posterior pharyngeal wall residue.    Recommend that pt  begin small snacks of Dysphagia 1 (puree) solids and honey-thick liquid with strict adherence to the following precautions: 1) PMV must be worn with all po intake 2) Small bites/sips 3) Sit upright 90 degrees 4) Slow rate of intake 5) 2-3 dry swallows after each bite/sip 6) Remain upright for 30+ minutes following po intake 7) Clear throat intermittently.  Additionally recommend that pt resume home health or outpatient speech therapy targeting dysphagia.  Pt may have small ice chips PRN in between meals following thorough oral care.  Recommend that pt continue to use PEG tube feedings as his main source of nutrition at this time.      Swallow Evaluation Recommendations       SLP Diet Recommendations: Small snacks of Dysphagia 1 (Puree) solids;Honey thick liquids; Continue to get main nutrition from PEG )   Liquid Administration via: Spoon;Cup;Straw   Medication Administration: Via alternative means   Supervision: Patient able to self feed   Compensations: Slow rate;Small sips/bites;Multiple dry swallows after each bite/sip;Clear throat intermittently   Postural Changes: Remain semi-upright after after feeds/meals (Comment);Seated upright at 90 degrees   Oral Care Recommendations: Oral care before and after PO;Oral care QID       Colin Mulders M.S., CCC-SLP Acute Rehabilitation Services Office: [phone]  [place] 03/26/2019,3:16 PM

## 2019-03-31 ENCOUNTER — Other Ambulatory Visit: Payer: Self-pay | Admitting: Family

## 2019-03-31 DIAGNOSIS — E785 Hyperlipidemia, unspecified: Secondary | ICD-10-CM

## 2019-03-31 NOTE — Telephone Encounter (Signed)
Apt scheduled.  

## 2019-03-31 NOTE — Telephone Encounter (Signed)
[facility] NTBS lipid 04/25/18 mail order not sent

## 2019-04-02 ENCOUNTER — Ambulatory Visit (HOSPITAL_COMMUNITY)
Admission: RE | Admit: 2019-04-02 | Discharge: 2019-04-02 | Disposition: A | Discharge status: Home / Self Care | Payer: Medicare Other | Source: Ambulatory Visit | Attending: Radiation Oncology | Admitting: Radiation Oncology

## 2019-04-02 ENCOUNTER — Encounter (HOSPITAL_COMMUNITY): Payer: Self-pay

## 2019-04-02 ENCOUNTER — Encounter (HOSPITAL_COMMUNITY)
Admission: RE | Admit: 2019-04-02 | Discharge: 2019-04-02 | Disposition: A | Discharge status: Home / Self Care | Payer: Medicare Other | Source: Ambulatory Visit | Attending: Radiation Oncology | Admitting: Radiation Oncology

## 2019-04-02 ENCOUNTER — Other Ambulatory Visit: Payer: Self-pay

## 2019-04-02 DIAGNOSIS — I700 Atherosclerosis of aorta: Secondary | ICD-10-CM | POA: Insufficient documentation

## 2019-04-02 DIAGNOSIS — J439 Emphysema, unspecified: Secondary | ICD-10-CM | POA: Diagnosis not present

## 2019-04-02 DIAGNOSIS — I2510 Atherosclerotic heart disease of native coronary artery without angina pectoris: Secondary | ICD-10-CM | POA: Diagnosis not present

## 2019-04-02 DIAGNOSIS — C320 Malignant neoplasm of glottis: Secondary | ICD-10-CM | POA: Diagnosis not present

## 2019-04-02 LAB — GLUCOSE, CAPILLARY: Glucose-Capillary: 97 mg/dL (ref 70–99)

## 2019-04-02 MED ORDER — SODIUM CHLORIDE (PF) 0.9 % IJ SOLN
INTRAMUSCULAR | Status: AC
  Filled 2019-04-02: qty 50

## 2019-04-02 MED ORDER — IOHEXOL 300 MG/ML  SOLN
75.0000 mL | Freq: Once | INTRAMUSCULAR | Status: AC | PRN
  Administered 2019-04-02: 75 mL via INTRAVENOUS

## 2019-04-02 MED ORDER — FLUDEOXYGLUCOSE F - 18 (FDG) INJECTION
6.5900 | Freq: Once | INTRAVENOUS | Status: AC | PRN
  Administered 2019-04-02: 6.59 via INTRAVENOUS

## 2019-04-03 ENCOUNTER — Other Ambulatory Visit: Payer: Self-pay | Admitting: Family

## 2019-04-03 DIAGNOSIS — R1310 Dysphagia, unspecified: Secondary | ICD-10-CM

## 2019-04-03 DIAGNOSIS — R1319 Other dysphagia: Secondary | ICD-10-CM

## 2019-04-03 DIAGNOSIS — Z431 Encounter for attention to gastrostomy: Secondary | ICD-10-CM | POA: Diagnosis not present

## 2019-04-03 DIAGNOSIS — Z79891 Long term (current) use of opiate analgesic: Secondary | ICD-10-CM | POA: Diagnosis not present

## 2019-04-03 DIAGNOSIS — C320 Malignant neoplasm of glottis: Secondary | ICD-10-CM | POA: Diagnosis not present

## 2019-04-03 DIAGNOSIS — Z430 Encounter for attention to tracheostomy: Secondary | ICD-10-CM | POA: Diagnosis not present

## 2019-04-03 DIAGNOSIS — Z9981 Dependence on supplemental oxygen: Secondary | ICD-10-CM | POA: Diagnosis not present

## 2019-04-03 DIAGNOSIS — C328 Malignant neoplasm of overlapping sites of larynx: Secondary | ICD-10-CM | POA: Diagnosis not present

## 2019-04-03 DIAGNOSIS — K9423 Gastrostomy malfunction: Secondary | ICD-10-CM | POA: Diagnosis not present

## 2019-04-04 ENCOUNTER — Ambulatory Visit
Admission: RE | Admit: 2019-04-04 | Discharge: 2019-04-04 | Disposition: A | Discharge status: Home / Self Care | Payer: Medicare Other | Source: Ambulatory Visit | Attending: Radiation Oncology | Admitting: Radiation Oncology

## 2019-04-04 DIAGNOSIS — Z9689 Presence of other specified functional implants: Secondary | ICD-10-CM | POA: Diagnosis not present

## 2019-04-04 DIAGNOSIS — C320 Malignant neoplasm of glottis: Secondary | ICD-10-CM

## 2019-04-04 DIAGNOSIS — R918 Other nonspecific abnormal finding of lung field: Secondary | ICD-10-CM | POA: Diagnosis not present

## 2019-04-04 DIAGNOSIS — Z08 Encounter for follow-up examination after completed treatment for malignant neoplasm: Secondary | ICD-10-CM | POA: Diagnosis not present

## 2019-04-04 NOTE — Progress Notes (Signed)
Radiation Oncology         [phone] ________________________________  Name: Logan Price MRN: [id number]  Date: 04/04/2019  DOB: 07-Aug-1945  Follow-Up Visit Note by telephone as patient was unable to access MyChart video during pandemic precautions   CC: Sharion Balloon, FNP  Leta Baptist, MD  Diagnosis and Prior Radiotherapy:       ICD-10-CM   1. 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     No smoking for past decade.  Gaining weight.  Following w/ SLP.  Had a swallowing test and is cleared for pureed diet. Dysphagia 1 (Puree) solids;Honey thick liquids. Has not gotten guidance on how to carry this out.  No pain. No concerns stated. Feeling relatively well. I have review his images personally and discussed results with him.  ALLERGIES:  has No Known Allergies.  Meds: Current Outpatient Medications  Medication Sig Dispense Refill  . Amino Acids-Protein Hydrolys (FEEDING SUPPLEMENT, PRO-STAT SUGAR FREE 64,) LIQD Place 30 mLs into feeding tube 2 (two) times daily. 887 mL 15  . doxazosin (CARDURA) 1 MG tablet Place 1 tablet (1 mg total) into feeding tube daily. (Patient taking differently: Place 1 mg into feeding tube daily at 12 noon. ) 30 tablet 5  . HYDROcodone-acetaminophen (NORCO) 10-325 MG tablet Take 1-2 tablets by mouth every 4 (four) hours as needed for moderate pain. Maximum dose per 24 hours - 8 pills 20 tablet 0  . Nutritional Supplements (KATE FARMS PEPTIDE 1.5) LIQD 325 mLs by PEG Tube route continuous. (Patient taking differently: 325 mLs by PEG Tube route 4 (four) times daily. ) 325 mL 12  . pantoprazole (PROTONIX) 40 MG tablet 1 po 30 mins prior to first meal (Patient taking differently: Take 40 mg by mouth daily. ) 90 tablet 3  . phenazopyridine (PYRIDIUM)  200 MG tablet Take 1 tablet (200 mg total) by mouth 3 (three) times daily as needed for pain. 20 tablet 0  . scopolamine (TRANSDERM-SCOP) 1 MG/3DAYS Place 1 patch (1.5 mg total) onto the skin every 3 (three) days. 10 patch 12  . simvastatin (ZOCOR) 20 MG tablet Take 1 tablet (20 mg total) by mouth at bedtime. (Needs to be seen before next refill) 90 tablet 0  . Water For Irrigation, Sterile (FREE WATER) SOLN Place 200 mLs into feeding tube every 4 (four) hours.     No current facility-administered medications for this encounter.    Physical Findings: The patient is in no acute distress. Patient is alert and oriented. Wt Readings from Last 3 Encounters:  03/09/19 132 lb 15 oz (60.3 kg)  02/24/19 138 lb 12.8 oz (63 kg)  02/18/19 138 lb 8 oz (62.8 kg)    vitals were not taken for this visit. .  General: Alert and oriented, in no acute distress   Lab Findings: Lab Results  Component Value Date   WBC 6.1 03/08/2019   HGB 12.5 (L) 03/08/2019   HCT 39.9 03/08/2019   MCV 90.3 03/08/2019   PLT 276 03/08/2019    Lab Results  Component Value Date   TSH 2.426 10/03/2018    Radiographic Findings: CT Soft Tissue Neck W Contrast  Result Date: 04/02/2019 CLINICAL DATA:  Laryngeal cancer treated with radiation therapy completed in 11/2018. EXAM: CT NECK WITH CONTRAST TECHNIQUE: Multidetector CT  Radiation Oncology         [phone] ________________________________  Name: Logan Price MRN: [id number]  Date: 04/04/2019  DOB: 07-Aug-1945  Follow-Up Visit Note by telephone as patient was unable to access MyChart video during pandemic precautions   CC: Sharion Balloon, FNP  Leta Baptist, MD  Diagnosis and Prior Radiotherapy:       ICD-10-CM   1. 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     No smoking for past decade.  Gaining weight.  Following w/ SLP.  Had a swallowing test and is cleared for pureed diet. Dysphagia 1 (Puree) solids;Honey thick liquids. Has not gotten guidance on how to carry this out.  No pain. No concerns stated. Feeling relatively well. I have review his images personally and discussed results with him.  ALLERGIES:  has No Known Allergies.  Meds: Current Outpatient Medications  Medication Sig Dispense Refill  . Amino Acids-Protein Hydrolys (FEEDING SUPPLEMENT, PRO-STAT SUGAR FREE 64,) LIQD Place 30 mLs into feeding tube 2 (two) times daily. 887 mL 15  . doxazosin (CARDURA) 1 MG tablet Place 1 tablet (1 mg total) into feeding tube daily. (Patient taking differently: Place 1 mg into feeding tube daily at 12 noon. ) 30 tablet 5  . HYDROcodone-acetaminophen (NORCO) 10-325 MG tablet Take 1-2 tablets by mouth every 4 (four) hours as needed for moderate pain. Maximum dose per 24 hours - 8 pills 20 tablet 0  . Nutritional Supplements (KATE FARMS PEPTIDE 1.5) LIQD 325 mLs by PEG Tube route continuous. (Patient taking differently: 325 mLs by PEG Tube route 4 (four) times daily. ) 325 mL 12  . pantoprazole (PROTONIX) 40 MG tablet 1 po 30 mins prior to first meal (Patient taking differently: Take 40 mg by mouth daily. ) 90 tablet 3  . phenazopyridine (PYRIDIUM)  200 MG tablet Take 1 tablet (200 mg total) by mouth 3 (three) times daily as needed for pain. 20 tablet 0  . scopolamine (TRANSDERM-SCOP) 1 MG/3DAYS Place 1 patch (1.5 mg total) onto the skin every 3 (three) days. 10 patch 12  . simvastatin (ZOCOR) 20 MG tablet Take 1 tablet (20 mg total) by mouth at bedtime. (Needs to be seen before next refill) 90 tablet 0  . Water For Irrigation, Sterile (FREE WATER) SOLN Place 200 mLs into feeding tube every 4 (four) hours.     No current facility-administered medications for this encounter.    Physical Findings: The patient is in no acute distress. Patient is alert and oriented. Wt Readings from Last 3 Encounters:  03/09/19 132 lb 15 oz (60.3 kg)  02/24/19 138 lb 12.8 oz (63 kg)  02/18/19 138 lb 8 oz (62.8 kg)    vitals were not taken for this visit. .  General: Alert and oriented, in no acute distress   Lab Findings: Lab Results  Component Value Date   WBC 6.1 03/08/2019   HGB 12.5 (L) 03/08/2019   HCT 39.9 03/08/2019   MCV 90.3 03/08/2019   PLT 276 03/08/2019    Lab Results  Component Value Date   TSH 2.426 10/03/2018    Radiographic Findings: CT Soft Tissue Neck W Contrast  Result Date: 04/02/2019 CLINICAL DATA:  Laryngeal cancer treated with radiation therapy completed in 11/2018. EXAM: CT NECK WITH CONTRAST TECHNIQUE: Multidetector CT  Radiation Oncology         [phone] ________________________________  Name: Logan Price MRN: [id number]  Date: 04/04/2019  DOB: 07-Aug-1945  Follow-Up Visit Note by telephone as patient was unable to access MyChart video during pandemic precautions   CC: Sharion Balloon, FNP  Leta Baptist, MD  Diagnosis and Prior Radiotherapy:       ICD-10-CM   1. 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     No smoking for past decade.  Gaining weight.  Following w/ SLP.  Had a swallowing test and is cleared for pureed diet. Dysphagia 1 (Puree) solids;Honey thick liquids. Has not gotten guidance on how to carry this out.  No pain. No concerns stated. Feeling relatively well. I have review his images personally and discussed results with him.  ALLERGIES:  has No Known Allergies.  Meds: Current Outpatient Medications  Medication Sig Dispense Refill  . Amino Acids-Protein Hydrolys (FEEDING SUPPLEMENT, PRO-STAT SUGAR FREE 64,) LIQD Place 30 mLs into feeding tube 2 (two) times daily. 887 mL 15  . doxazosin (CARDURA) 1 MG tablet Place 1 tablet (1 mg total) into feeding tube daily. (Patient taking differently: Place 1 mg into feeding tube daily at 12 noon. ) 30 tablet 5  . HYDROcodone-acetaminophen (NORCO) 10-325 MG tablet Take 1-2 tablets by mouth every 4 (four) hours as needed for moderate pain. Maximum dose per 24 hours - 8 pills 20 tablet 0  . Nutritional Supplements (KATE FARMS PEPTIDE 1.5) LIQD 325 mLs by PEG Tube route continuous. (Patient taking differently: 325 mLs by PEG Tube route 4 (four) times daily. ) 325 mL 12  . pantoprazole (PROTONIX) 40 MG tablet 1 po 30 mins prior to first meal (Patient taking differently: Take 40 mg by mouth daily. ) 90 tablet 3  . phenazopyridine (PYRIDIUM)  200 MG tablet Take 1 tablet (200 mg total) by mouth 3 (three) times daily as needed for pain. 20 tablet 0  . scopolamine (TRANSDERM-SCOP) 1 MG/3DAYS Place 1 patch (1.5 mg total) onto the skin every 3 (three) days. 10 patch 12  . simvastatin (ZOCOR) 20 MG tablet Take 1 tablet (20 mg total) by mouth at bedtime. (Needs to be seen before next refill) 90 tablet 0  . Water For Irrigation, Sterile (FREE WATER) SOLN Place 200 mLs into feeding tube every 4 (four) hours.     No current facility-administered medications for this encounter.    Physical Findings: The patient is in no acute distress. Patient is alert and oriented. Wt Readings from Last 3 Encounters:  03/09/19 132 lb 15 oz (60.3 kg)  02/24/19 138 lb 12.8 oz (63 kg)  02/18/19 138 lb 8 oz (62.8 kg)    vitals were not taken for this visit. .  General: Alert and oriented, in no acute distress   Lab Findings: Lab Results  Component Value Date   WBC 6.1 03/08/2019   HGB 12.5 (L) 03/08/2019   HCT 39.9 03/08/2019   MCV 90.3 03/08/2019   PLT 276 03/08/2019    Lab Results  Component Value Date   TSH 2.426 10/03/2018    Radiographic Findings: CT Soft Tissue Neck W Contrast  Result Date: 04/02/2019 CLINICAL DATA:  Laryngeal cancer treated with radiation therapy completed in 11/2018. EXAM: CT NECK WITH CONTRAST TECHNIQUE: Multidetector CT  Study: MBS-Modified Barium Swallow Study  Patient Details Name: TAUHEED MCFAYDEN MRN: [id number] Date of Birth: 13-Feb-1946 Today's Date: 03/26/2019 Time: SLP Start Time (ACUTE ONLY): 1310 -SLP Stop Time (ACUTE ONLY): 1358 SLP Time Calculation  (min) (ACUTE ONLY): 48 min Past Medical History: Past Medical History: Diagnosis Date . Cancer ([place]) 08/2018  trach . Diverticulitis  . Dysrhythmia 09/2018  episode of SVT while in hospital . 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 GERD (gastroesophageal reflux disease)  . HTN (hypertension)  . Hyperlipidemia  Past Surgical History: Past Surgical History: Procedure Laterality Date . BIOPSY  05/04/2015  Procedure: BIOPSY;  Surgeon: Danie Binder, MD;  [facility]: [facility];  Service: Endoscopy;;  bx's of ileocecal valve  . COLONOSCOPY WITH PROPOFOL N/A 05/04/2015  Dr. Oneida Alar: normal appearing ileum with prominent IC valve with tubular adenomas, moderate diverticulosis in sigmoid colon, ascending colon, and retum. Moderate sized internal hemorrhoids. Surveillance in 5 years . ESOPHAGOGASTRODUODENOSCOPY (EGD) WITH PROPOFOL N/A 12/19/2016  Procedure: ESOPHAGOGASTRODUODENOSCOPY (EGD) WITH PROPOFOL;  Surgeon: Danie Binder, MD;  [facility]: [facility];  Service: Endoscopy;  Laterality: N/A;  11:30am . FLEXIBLE SIGMOIDOSCOPY N/A 12/10/2015  hemorrhoid banding X 3  . HEMORRHOID BANDING N/A 12/10/2015  Procedure: HEMORRHOID BANDING;  Surgeon: Danie Binder, MD;  [facility]: [facility];  Service: Endoscopy;  Laterality: N/A;  1:30 PM . INSERTION OF SUPRAPUBIC CATHETER N/A 02/24/2019  Procedure: INSERTION OF SUPRAPUBIC CATHETER;  Surgeon: Kathie Rhodes, MD;  Location: [facility];  Service: Urology;  Laterality: N/A; . IR CM INJ ANY COLONIC TUBE W/FLUORO  10/30/2018 . IR GASTROSTOMY TUBE MOD SED  10/04/2018 . IR IMAGING GUIDED PORT INSERTION  10/04/2018 . IR REMOVAL TUN ACCESS W/ PORT W/O FL MOD SED  10/14/2018 . IR REPLACE G-TUBE SIMPLE WO FLUORO  11/03/2018 . LAPAROSCOPIC INSERTION GASTROSTOMY TUBE Left 10/07/2018  Procedure: LAPAROSCOPIC  GASTROSTOMY TUBE;  Surgeon: Kinsinger, Arta Bruce, MD;  Location: [place];  Service: General;  Laterality: Left; Marland Kitchen MICROLARYNGOSCOPY N/A 09/27/2018  Procedure: MICRO DIRECT  LARYNGOSCOPY WITH BIOPSY;  Surgeon: Leta Baptist, MD;  Location: [facility];  Service: ENT;  Laterality: N/A; . None to date    As of 04/14/15 . POLYPECTOMY  05/04/2015  Procedure: POLYPECTOMY;  Surgeon: Danie Binder, MD;  [facility];  Service: Endoscopy;;  descending colon polyp, ascending colon polyp . SAVORY DILATION N/A 12/19/2016  Procedure: SAVORY DILATION;  Surgeon: Danie Binder, MD;  [facility];  Service: Endoscopy;  Laterality: N/A; . TRACHEOSTOMY TUBE PLACEMENT N/A 09/27/2018  Procedure: AWAKE TRACHEOSTOMY;  Surgeon: Leta Baptist, MD;  Location: [facility];  Service: ENT;  Laterality: N/A; . TRANSURETHRAL RESECTION OF PROSTATE N/A 02/24/2019  Procedure: TRANSURETHRAL RESECTION OF THE PROSTATE (TURP);  Surgeon: Kathie Rhodes, MD;  Location: [facility];  Service: Urology;  Laterality: N/A; HPI: Pt is a 74 yo male admitted for planned trach and laryngeal biopsy 7/2. Biopsy results pending but Neck CT shows extensive laryngeal, pharyngeal, esophageal mass- diagnosed as cancer.  Pt is s/p trach and PEG tube.  He is undergoing radiation tx started 10/22/2018.   Esophagram 6/26 showed silent gross aspiration of barium. PMH includes: HLD, HTN, hepatitis C, GERD, diverticulitis.  Pt has been followed by SLP for dysphagia and pmsv.  Pt completed MBS on 10/29/18 with recommendations for NPO with small ice chips as tolerated.  Pt reported that he began radiation on 10/22/18 and finished on 12/10/18.   Pt had been receiving home health speech  Study: MBS-Modified Barium Swallow Study  Patient Details Name: TAUHEED MCFAYDEN MRN: [id number] Date of Birth: 13-Feb-1946 Today's Date: 03/26/2019 Time: SLP Start Time (ACUTE ONLY): 1310 -SLP Stop Time (ACUTE ONLY): 1358 SLP Time Calculation  (min) (ACUTE ONLY): 48 min Past Medical History: Past Medical History: Diagnosis Date . Cancer ([place]) 08/2018  trach . Diverticulitis  . Dysrhythmia 09/2018  episode of SVT while in hospital . 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 GERD (gastroesophageal reflux disease)  . HTN (hypertension)  . Hyperlipidemia  Past Surgical History: Past Surgical History: Procedure Laterality Date . BIOPSY  05/04/2015  Procedure: BIOPSY;  Surgeon: Danie Binder, MD;  [facility];  Service: Endoscopy;;  bx's of ileocecal valve  . COLONOSCOPY WITH PROPOFOL N/A 05/04/2015  Dr. Oneida Alar: normal appearing ileum with prominent IC valve with tubular adenomas, moderate diverticulosis in sigmoid colon, ascending colon, and retum. Moderate sized internal hemorrhoids. Surveillance in 5 years . ESOPHAGOGASTRODUODENOSCOPY (EGD) WITH PROPOFOL N/A 12/19/2016  Procedure: ESOPHAGOGASTRODUODENOSCOPY (EGD) WITH PROPOFOL;  Surgeon: Danie Binder, MD;  [facility];  Service: Endoscopy;  Laterality: N/A;  11:30am . FLEXIBLE SIGMOIDOSCOPY N/A 12/10/2015  hemorrhoid banding X 3  . HEMORRHOID BANDING N/A 12/10/2015  Procedure: HEMORRHOID BANDING;  Surgeon: Danie Binder, MD;  [facility];  Service: Endoscopy;  Laterality: N/A;  1:30 PM . INSERTION OF SUPRAPUBIC CATHETER N/A 02/24/2019  Procedure: INSERTION OF SUPRAPUBIC CATHETER;  Surgeon: Kathie Rhodes, MD;  Location: [facility];  Service: Urology;  Laterality: N/A; . IR CM INJ ANY COLONIC TUBE W/FLUORO  10/30/2018 . IR GASTROSTOMY TUBE MOD SED  10/04/2018 . IR IMAGING GUIDED PORT INSERTION  10/04/2018 . IR REMOVAL TUN ACCESS W/ PORT W/O FL MOD SED  10/14/2018 . IR REPLACE G-TUBE SIMPLE WO FLUORO  11/03/2018 . LAPAROSCOPIC INSERTION GASTROSTOMY TUBE Left 10/07/2018  Procedure: LAPAROSCOPIC  GASTROSTOMY TUBE;  Surgeon: Kinsinger, Arta Bruce, MD;  Location: [place];  Service: General;  Laterality: Left; Marland Kitchen MICROLARYNGOSCOPY N/A 09/27/2018  Procedure: MICRO DIRECT  LARYNGOSCOPY WITH BIOPSY;  Surgeon: Leta Baptist, MD;  Location: [facility];  Service: ENT;  Laterality: N/A; . None to date    As of 04/14/15 . POLYPECTOMY  05/04/2015  Procedure: POLYPECTOMY;  Surgeon: Danie Binder, MD;  [facility];  Service: Endoscopy;;  descending colon polyp, ascending colon polyp . SAVORY DILATION N/A 12/19/2016  Procedure: SAVORY DILATION;  Surgeon: Danie Binder, MD;  [facility];  Service: Endoscopy;  Laterality: N/A; . TRACHEOSTOMY TUBE PLACEMENT N/A 09/27/2018  Procedure: AWAKE TRACHEOSTOMY;  Surgeon: Leta Baptist, MD;  Location: [facility];  Service: ENT;  Laterality: N/A; . TRANSURETHRAL RESECTION OF PROSTATE N/A 02/24/2019  Procedure: TRANSURETHRAL RESECTION OF THE PROSTATE (TURP);  Surgeon: Kathie Rhodes, MD;  Location: [facility];  Service: Urology;  Laterality: N/A; HPI: Pt is a 74 yo male admitted for planned trach and laryngeal biopsy 7/2. Biopsy results pending but Neck CT shows extensive laryngeal, pharyngeal, esophageal mass- diagnosed as cancer.  Pt is s/p trach and PEG tube.  He is undergoing radiation tx started 10/22/2018.   Esophagram 6/26 showed silent gross aspiration of barium. PMH includes: HLD, HTN, hepatitis C, GERD, diverticulitis.  Pt has been followed by SLP for dysphagia and pmsv.  Pt completed MBS on 10/29/18 with recommendations for NPO with small ice chips as tolerated.  Pt reported that he began radiation on 10/22/18 and finished on 12/10/18.   Pt had been receiving home health speech  Study: MBS-Modified Barium Swallow Study  Patient Details Name: TAUHEED MCFAYDEN MRN: [id number] Date of Birth: 13-Feb-1946 Today's Date: 03/26/2019 Time: SLP Start Time (ACUTE ONLY): 1310 -SLP Stop Time (ACUTE ONLY): 1358 SLP Time Calculation  (min) (ACUTE ONLY): 48 min Past Medical History: Past Medical History: Diagnosis Date . Cancer ([place]) 08/2018  trach . Diverticulitis  . Dysrhythmia 09/2018  episode of SVT while in hospital . 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 GERD (gastroesophageal reflux disease)  . HTN (hypertension)  . Hyperlipidemia  Past Surgical History: Past Surgical History: Procedure Laterality Date . BIOPSY  05/04/2015  Procedure: BIOPSY;  Surgeon: Danie Binder, MD;  [facility];  Service: Endoscopy;;  bx's of ileocecal valve  . COLONOSCOPY WITH PROPOFOL N/A 05/04/2015  Dr. Oneida Alar: normal appearing ileum with prominent IC valve with tubular adenomas, moderate diverticulosis in sigmoid colon, ascending colon, and retum. Moderate sized internal hemorrhoids. Surveillance in 5 years . ESOPHAGOGASTRODUODENOSCOPY (EGD) WITH PROPOFOL N/A 12/19/2016  Procedure: ESOPHAGOGASTRODUODENOSCOPY (EGD) WITH PROPOFOL;  Surgeon: Danie Binder, MD;  [facility];  Service: Endoscopy;  Laterality: N/A;  11:30am . FLEXIBLE SIGMOIDOSCOPY N/A 12/10/2015  hemorrhoid banding X 3  . HEMORRHOID BANDING N/A 12/10/2015  Procedure: HEMORRHOID BANDING;  Surgeon: Danie Binder, MD;  [facility];  Service: Endoscopy;  Laterality: N/A;  1:30 PM . INSERTION OF SUPRAPUBIC CATHETER N/A 02/24/2019  Procedure: INSERTION OF SUPRAPUBIC CATHETER;  Surgeon: Kathie Rhodes, MD;  [facility];  Service: Urology;  Laterality: N/A; . IR CM INJ ANY COLONIC TUBE W/FLUORO  10/30/2018 . IR GASTROSTOMY TUBE MOD SED  10/04/2018 . IR IMAGING GUIDED PORT INSERTION  10/04/2018 . IR REMOVAL TUN ACCESS W/ PORT W/O FL MOD SED  10/14/2018 . IR REPLACE G-TUBE SIMPLE WO FLUORO  11/03/2018 . LAPAROSCOPIC INSERTION GASTROSTOMY TUBE Left 10/07/2018  Procedure: LAPAROSCOPIC  GASTROSTOMY TUBE;  Surgeon: Kinsinger, Arta Bruce, MD;  Location: [place];  Service: General;  Laterality: Left; Marland Kitchen MICROLARYNGOSCOPY N/A 09/27/2018  Procedure: MICRO DIRECT  LARYNGOSCOPY WITH BIOPSY;  Surgeon: Leta Baptist, MD;  Location: [facility];  Service: ENT;  Laterality: N/A; . None to date    As of 04/14/15 . POLYPECTOMY  05/04/2015  Procedure: POLYPECTOMY;  Surgeon: Danie Binder, MD;  [facility];  Service: Endoscopy;;  descending colon polyp, ascending colon polyp . SAVORY DILATION N/A 12/19/2016  Procedure: SAVORY DILATION;  Surgeon: Danie Binder, MD;  [facility];  Service: Endoscopy;  Laterality: N/A; . TRACHEOSTOMY TUBE PLACEMENT N/A 09/27/2018  Procedure: AWAKE TRACHEOSTOMY;  Surgeon: Leta Baptist, MD;  Location: [facility];  Service: ENT;  Laterality: N/A; . TRANSURETHRAL RESECTION OF PROSTATE N/A 02/24/2019  Procedure: TRANSURETHRAL RESECTION OF THE PROSTATE (TURP);  Surgeon: Kathie Rhodes, MD;  Location: [facility];  Service: Urology;  Laterality: N/A; HPI: Pt is a 74 yo male admitted for planned trach and laryngeal biopsy 7/2. Biopsy results pending but Neck CT shows extensive laryngeal, pharyngeal, esophageal mass- diagnosed as cancer.  Pt is s/p trach and PEG tube.  He is undergoing radiation tx started 10/22/2018.   Esophagram 6/26 showed silent gross aspiration of barium. PMH includes: HLD, HTN, hepatitis C, GERD, diverticulitis.  Pt has been followed by SLP for dysphagia and pmsv.  Pt completed MBS on 10/29/18 with recommendations for NPO with small ice chips as tolerated.  Pt reported that he began radiation on 10/22/18 and finished on 12/10/18.   Pt had been receiving home health speech  Radiation Oncology         [phone] ________________________________  Name: Logan Price MRN: [id number]  Date: 04/04/2019  DOB: 07-Aug-1945  Follow-Up Visit Note by telephone as patient was unable to access MyChart video during pandemic precautions   CC: Sharion Balloon, FNP  Leta Baptist, MD  Diagnosis and Prior Radiotherapy:       ICD-10-CM   1. 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     No smoking for past decade.  Gaining weight.  Following w/ SLP.  Had a swallowing test and is cleared for pureed diet. Dysphagia 1 (Puree) solids;Honey thick liquids. Has not gotten guidance on how to carry this out.  No pain. No concerns stated. Feeling relatively well. I have review his images personally and discussed results with him.  ALLERGIES:  has No Known Allergies.  Meds: Current Outpatient Medications  Medication Sig Dispense Refill  . Amino Acids-Protein Hydrolys (FEEDING SUPPLEMENT, PRO-STAT SUGAR FREE 64,) LIQD Place 30 mLs into feeding tube 2 (two) times daily. 887 mL 15  . doxazosin (CARDURA) 1 MG tablet Place 1 tablet (1 mg total) into feeding tube daily. (Patient taking differently: Place 1 mg into feeding tube daily at 12 noon. ) 30 tablet 5  . HYDROcodone-acetaminophen (NORCO) 10-325 MG tablet Take 1-2 tablets by mouth every 4 (four) hours as needed for moderate pain. Maximum dose per 24 hours - 8 pills 20 tablet 0  . Nutritional Supplements (KATE FARMS PEPTIDE 1.5) LIQD 325 mLs by PEG Tube route continuous. (Patient taking differently: 325 mLs by PEG Tube route 4 (four) times daily. ) 325 mL 12  . pantoprazole (PROTONIX) 40 MG tablet 1 po 30 mins prior to first meal (Patient taking differently: Take 40 mg by mouth daily. ) 90 tablet 3  . phenazopyridine (PYRIDIUM)  200 MG tablet Take 1 tablet (200 mg total) by mouth 3 (three) times daily as needed for pain. 20 tablet 0  . scopolamine (TRANSDERM-SCOP) 1 MG/3DAYS Place 1 patch (1.5 mg total) onto the skin every 3 (three) days. 10 patch 12  . simvastatin (ZOCOR) 20 MG tablet Take 1 tablet (20 mg total) by mouth at bedtime. (Needs to be seen before next refill) 90 tablet 0  . Water For Irrigation, Sterile (FREE WATER) SOLN Place 200 mLs into feeding tube every 4 (four) hours.     No current facility-administered medications for this encounter.    Physical Findings: The patient is in no acute distress. Patient is alert and oriented. Wt Readings from Last 3 Encounters:  03/09/19 132 lb 15 oz (60.3 kg)  02/24/19 138 lb 12.8 oz (63 kg)  02/18/19 138 lb 8 oz (62.8 kg)    vitals were not taken for this visit. .  General: Alert and oriented, in no acute distress   Lab Findings: Lab Results  Component Value Date   WBC 6.1 03/08/2019   HGB 12.5 (L) 03/08/2019   HCT 39.9 03/08/2019   MCV 90.3 03/08/2019   PLT 276 03/08/2019    Lab Results  Component Value Date   TSH 2.426 10/03/2018    Radiographic Findings: CT Soft Tissue Neck W Contrast  Result Date: 04/02/2019 CLINICAL DATA:  Laryngeal cancer treated with radiation therapy completed in 11/2018. EXAM: CT NECK WITH CONTRAST TECHNIQUE: Multidetector CT

## 2019-04-07 ENCOUNTER — Telehealth: Payer: Self-pay | Admitting: *Deleted

## 2019-04-07 ENCOUNTER — Telehealth: Payer: Self-pay | Admitting: Radiation Oncology

## 2019-04-07 NOTE — Telephone Encounter (Signed)
Scheduled per 1/8 sch msg. Called and spoke with pt, confirmed 1/26 appt

## 2019-04-07 NOTE — Telephone Encounter (Signed)
Oncology Nurse Navigator Documentation  Per patient's 1/8 post-treatment follow-up with Dr. Isidore Moos, called ENT Dr. Deeann Saint office to coordinate appointment .  Spoke with Junie Panning, requested patient be contacted and scheduled for next available with Dr. Benjamine Mola to include examination for residual disease.  She indicated appt available this Wed, she will give him a call to arrange.    Gayleen Orem, RN, BSN Head & Neck Oncology Nurse [facility] [phone]

## 2019-04-08 ENCOUNTER — Encounter: Payer: Self-pay | Admitting: Radiation Oncology

## 2019-04-08 ENCOUNTER — Other Ambulatory Visit: Payer: Self-pay | Admitting: Radiation Oncology

## 2019-04-08 ENCOUNTER — Other Ambulatory Visit: Payer: Self-pay

## 2019-04-08 DIAGNOSIS — Z1329 Encounter for screening for other suspected endocrine disorder: Secondary | ICD-10-CM

## 2019-04-08 DIAGNOSIS — R5383 Other fatigue: Secondary | ICD-10-CM

## 2019-04-09 ENCOUNTER — Ambulatory Visit (INDEPENDENT_AMBULATORY_CARE_PROVIDER_SITE_OTHER): Payer: Medicare Other | Admitting: Family

## 2019-04-09 ENCOUNTER — Telehealth: Payer: Self-pay | Admitting: *Deleted

## 2019-04-09 ENCOUNTER — Encounter: Payer: Self-pay | Admitting: Family

## 2019-04-09 VITALS — BP 118/77 | HR 83 | Temp 98.6°F | Ht 69.0 in | Wt 141.0 lb

## 2019-04-09 DIAGNOSIS — E785 Hyperlipidemia, unspecified: Secondary | ICD-10-CM | POA: Diagnosis not present

## 2019-04-09 DIAGNOSIS — C320 Malignant neoplasm of glottis: Secondary | ICD-10-CM

## 2019-04-09 DIAGNOSIS — R634 Abnormal weight loss: Secondary | ICD-10-CM

## 2019-04-09 DIAGNOSIS — I10 Essential (primary) hypertension: Secondary | ICD-10-CM | POA: Diagnosis not present

## 2019-04-09 DIAGNOSIS — K219 Gastro-esophageal reflux disease without esophagitis: Secondary | ICD-10-CM | POA: Diagnosis not present

## 2019-04-09 NOTE — Patient Instructions (Signed)
Dysphagia Eating Plan, Pureed This diet is helpful for people with moderate to severe swallowing problems. Pureed foods are smooth and are prepared without lumps so that they can be swallowed safely. Work with your health care provider and your diet and nutrition specialist (dietitian) to make sure that you are following the diet safely and getting all the nutrients you need. What are tips for following this plan? General instructions  You may eat foods that are soft and have a pudding-like texture.  Do not eat foods that you have to chew. If you have to chew the food, then you cannot eat it.  Avoid foods that are hard, dry, sticky, chunky, lumpy, or stringy. Also avoid foods with nuts, seeds, raisins, skins, or pulp.  You may be instructed to thicken liquids. Follow your health care provider's instructions about how to do this and to what consistency. Cooking   If a food is not originally a smooth texture, you may be able to eat the food after: ? Pureeing it. This can be done with a blender. ? Moistening it. This can be done by adding juice, cooking liquid, gravy, or sauce to a dry food and then pureeing it. For example, you may have bread if you soak it in milk and puree it.  If a food is too thin, you may add a commercial thickener, corn starch, rice cereal, or potato flakes to thicken it.  Strain and throw away any liquid that separates from a solid pureed food before eating.  Strain lumps, chunks, pulp, and seeds from pureed foods before eating.  Reheat foods slowly to prevent a tough crust from forming. Meal planning  Eat a variety of foods to get all the nutrients you need.  Add dry milk or protein powder to food to increase calories and protein content.  Follow your meal plan as told by your dietitian. What foods are allowed? The items listed may not be a complete list. Talk with your dietitian about what dietary choices are best for you. Grains Soft breads, pancakes,  French toast, muffins, and bread stuffing pureed to a smooth, moist texture, without nuts or seeds. Cooked cereals that have a pudding-like consistency, such as cream of wheat or farina. Pureed oatmeal. Pureed, well-cooked pasta and rice. Vegetables Pureed vegetables. Smooth tomato paste or sauce. Mashed or pureed potatoes without skin. Fruits Pureed fruits such as melons and apples without seeds or pulp. Mashed bananas. Mashed avocado. Fruit juices without pulp or seeds. Meats and other protein foods Pureed meat, poultry, and fish. Smooth pate or liverwurst. Smooth souffles. Pureed beans (such as lentils). Pureed eggs. Smooth nut and seed butters. Pureed tofu. Dairy Yogurt. Milk. Pureed cottage cheese. Nutritional dairy drinks or shakes. Cream cheese. Smooth pudding, ice cream, sherbet, and malts. Fats and oils Butter. Margarine. Vegetable oils. Smooth and strained gravy. Sour cream. Mayonnaise. Smooth sauces such as white sauce, cheese sauce, or hollandaise sauce. Sweets and desserts Moistened and pureed cookies and cakes. Whipped topping. Gelatin. Pudding pops. Seasoning and other foods Finely ground spices. Jelly. Honey. Pureed casseroles. Strained soups. Pureed sandwiches. Beverages Anything prepared at the consistency recommended by your dietitian. What foods are not allowed? The items listed may not be a complete list. Talk with your dietitian about what dietary choices are best for you. Grains Oatmeal. Dry cereals. Hard breads. Breads with seeds or nuts. Whole pasta, rice, or other grains. Whole pancakes, waffles, biscuits, muffins, or rolls. Vegetables Whole vegetables. Stringy vegetables (such as celery). Tomatoes or tomato   sauce with seeds. Fried vegetables. Fruits Whole fresh, frozen, canned, or dried fruits that have not been pureed. Stringy fruits, such as pineapple or coconut. Watermelon with seeds. Dried fruit or fruit leather. Meat and other protein foods Whole or ground  meat, fish, or poultry. Dried or cooked lentils or legumes that have been cooked but not mashed or pureed. Non-pureed eggs. Nuts and seeds. Crunchy peanut butter. Whole tofu or other meat alternatives. Dairy Cheese cubes or slices. Non-pureed cottage cheese. Yogurt with fruit chunks. Fats and oils All fats and sauces that have lumps or chunks. Sweets and desserts Solid desserts. Sticky, chewy sweets (such as licorice and caramel). Candy with nuts or coconut. Seasoning and other foods Coarse or seeded herbs and spices. Chunky preserves. Jams with seeds. Whole sandwiches. Non-pureed casseroles. Chunky soups. Summary  Pureed foods can be helpful for people with moderate to severe swallowing problems.  On the dysphagia eating plan, you may eat foods that are soft and have a pudding-like texture. You should avoid foods that you have to chew. If you have to chew the food, then you cannot eat it.  You may be instructed to thicken liquids. Follow your health care provider's instructions about how to do this and to what consistency. This information is not intended to replace advice given to you by your health care provider. Make sure you discuss any questions you have with your health care provider. Document Revised: 07/04/2018 Document Reviewed: 05/16/2016 Elsevier Patient Education  2020 [employer] Inc.  

## 2019-04-09 NOTE — Progress Notes (Signed)
Subjective:    Patient ID: Logan Price, male    DOB: 05-30-1945, 74 y.o.   MRN: [id number]  No chief complaint on file.  Pt presents to the office today for chronic follow up. 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  He is followed by ENT for trach care and is hoping for a reversal of trach. He is working with speech and had a swallowing test completed 03/26/19. His diet has progressed to puree diet. He reports he is doing well with this.   He is currently has a PEG tube and does feedings tube feedings. He is followed by dietitian.   He reports he has gain 19 lbs over the last few months.  Hypertension This is a chronic problem. The problem has been resolved since onset. The problem is controlled. Associated symptoms include malaise/fatigue. Pertinent negatives include no peripheral edema or shortness of breath. The current treatment provides moderate improvement.  Hyperlipidemia This is a chronic problem. The current episode started more than 1 year ago. The problem is controlled. Recent lipid tests were reviewed and are normal. Pertinent negatives include no shortness of breath.      Review of Systems  Constitutional: Positive for malaise/fatigue.  Respiratory: Negative for shortness of breath.   All other systems reviewed and are negative.      Objective:   Physical Exam Vitals reviewed.  Constitutional:      General: He is not in acute distress.    Appearance: He is well-developed.  HENT:     Head: Normocephalic.     Right Ear: Tympanic membrane normal.     Left Ear: Tympanic membrane normal.     Mouth/Throat:     Comments: Trach in place Eyes:     General:        Right eye: No discharge.        Left eye: No discharge.     Pupils: Pupils are equal, round, and reactive to light.  Neck:     Thyroid: No thyromegaly.  Cardiovascular:     Rate and Rhythm: Normal rate and regular rhythm.     Heart sounds: Normal  heart sounds. No murmur.  Pulmonary:     Effort: Pulmonary effort is normal. No respiratory distress.     Breath sounds: Normal breath sounds. No wheezing.  Abdominal:     General: Bowel sounds are normal. There is no distension.     Palpations: Abdomen is soft.     Tenderness: There is no abdominal tenderness.     Comments: PEG tube in place  Musculoskeletal:        General: No tenderness. Normal range of motion.     Cervical back: Normal range of motion and neck supple.  Skin:    General: Skin is warm and dry.     Findings: No erythema or rash.  Neurological:     Mental Status: He is alert and oriented to person, place, and time.     Cranial Nerves: No cranial nerve deficit.     Motor: Weakness present.     Deep Tendon Reflexes: Reflexes are normal and symmetric.     Comments: Generalized weakness noted, using rolling walker  Psychiatric:        Behavior: Behavior normal.        Thought Content: Thought content normal.        Judgment: Judgment normal.       BP 118/77   Pulse 83  Temp 98.6 F (37 C) (Temporal)   Ht 5\' 9"  (1.753 m)   Wt 141 lb (64 kg)   SpO2 100%   BMI 20.82 kg/m      Assessment & Plan:  JOVANI LABER comes in today with chief complaint of Medical Management of Chronic Issues   Diagnosis and orders addressed:  1. Squamous cell carcinoma of glottis (HCC) - CMP14+EGFR - CBC with Differential/Platelet - TSH  2. Essential hypertension - CMP14+EGFR - CBC with Differential/Platelet - TSH  3. Gastroesophageal reflux disease without esophagitis  4. Hyperlipidemia, unspecified hyperlipidemia type  5. Loss of weight Improved, keep up the great work! Continue with tube feeding and puree diet  Keep appts with Oncologists and ENT Continue with speech and dietitian follow up Labs pending Health Maintenance reviewed Diet and exercise encouraged  Follow up plan: 6 months   [name], FNP

## 2019-04-09 NOTE — Telephone Encounter (Signed)
CALLED PATIENT TO ASK ABOUT COMING FOR LAB AND FU ON 07-25-19,SPOKE WITH  PATIENT AND HE AGREED TO COME IN @ 2:30 PM FOR LAB AND 3 PM FOR FU.

## 2019-04-10 ENCOUNTER — Other Ambulatory Visit: Payer: Self-pay | Admitting: *Deleted

## 2019-04-10 LAB — CBC WITH DIFFERENTIAL/PLATELET
Basophils Absolute: 0 10*3/uL (ref 0.0–0.2)
Basos: 1 %
EOS (ABSOLUTE): 0.1 10*3/uL (ref 0.0–0.4)
Eos: 1 %
Hematocrit: 37.3 % — ABNORMAL LOW (ref 37.5–51.0)
Hemoglobin: 12.3 g/dL — ABNORMAL LOW (ref 13.0–17.7)
Immature Grans (Abs): 0 10*3/uL (ref 0.0–0.1)
Immature Granulocytes: 0 %
Lymphocytes Absolute: 0.7 10*3/uL (ref 0.7–3.1)
Lymphs: 12 %
MCH: 28.3 pg (ref 26.6–33.0)
MCHC: 33 g/dL (ref 31.5–35.7)
MCV: 86 fL (ref 79–97)
Monocytes Absolute: 0.6 10*3/uL (ref 0.1–0.9)
Monocytes: 11 %
Neutrophils Absolute: 4.1 10*3/uL (ref 1.4–7.0)
Neutrophils: 75 %
Platelets: 279 10*3/uL (ref 150–450)
RBC: 4.35 x10E6/uL (ref 4.14–5.80)
RDW: 13 % (ref 11.6–15.4)
WBC: 5.5 10*3/uL (ref 3.4–10.8)

## 2019-04-10 LAB — CMP14+EGFR
ALT: 12 IU/L (ref 0–44)
AST: 19 IU/L (ref 0–40)
Albumin/Globulin Ratio: 1.4 (ref 1.2–2.2)
Albumin: 4 g/dL (ref 3.7–4.7)
Alkaline Phosphatase: 58 IU/L (ref 39–117)
BUN/Creatinine Ratio: 36 — ABNORMAL HIGH (ref 10–24)
BUN: 28 mg/dL — ABNORMAL HIGH (ref 8–27)
Bilirubin Total: <0.2 mg/dL (ref 0.0–1.2)
CO2: 29 mmol/L (ref 20–29)
Calcium: 9.6 mg/dL (ref 8.6–10.2)
Chloride: 103 mmol/L (ref 96–106)
Creatinine, Ser: 0.78 mg/dL (ref 0.76–1.27)
GFR calc Af Amer: 104 mL/min/{1.73_m2} (ref >59)
GFR calc non Af Amer: 90 mL/min/{1.73_m2} (ref >59)
Globulin, Total: 2.8 g/dL (ref 1.5–4.5)
Glucose: 110 mg/dL — ABNORMAL HIGH (ref 65–99)
Potassium: 4.9 mmol/L (ref 3.5–5.2)
Sodium: 143 mmol/L (ref 134–144)
Total Protein: 6.8 g/dL (ref 6.0–8.5)

## 2019-04-10 LAB — TSH: TSH: 3.91 u[IU]/mL (ref 0.450–4.500)

## 2019-04-10 NOTE — Progress Notes (Signed)
The proposed treatment discussion in cancer conference 04/10/19 is for discussion purpose only and is not a binding recommendation.  The patient was not physically examine nor present for their treatment options.  Therefore, final treatment plans cannot be decided.

## 2019-04-11 ENCOUNTER — Telehealth: Payer: Self-pay | Admitting: *Deleted

## 2019-04-11 DIAGNOSIS — Z431 Encounter for attention to gastrostomy: Secondary | ICD-10-CM | POA: Diagnosis not present

## 2019-04-11 DIAGNOSIS — C320 Malignant neoplasm of glottis: Secondary | ICD-10-CM | POA: Diagnosis not present

## 2019-04-11 DIAGNOSIS — K9423 Gastrostomy malfunction: Secondary | ICD-10-CM | POA: Diagnosis not present

## 2019-04-11 DIAGNOSIS — C328 Malignant neoplasm of overlapping sites of larynx: Secondary | ICD-10-CM | POA: Diagnosis not present

## 2019-04-11 DIAGNOSIS — R1310 Dysphagia, unspecified: Secondary | ICD-10-CM | POA: Diagnosis not present

## 2019-04-11 DIAGNOSIS — Z430 Encounter for attention to tracheostomy: Secondary | ICD-10-CM | POA: Diagnosis not present

## 2019-04-11 NOTE — Telephone Encounter (Signed)
Tc from [place] w/ [facility] Pt is receiving Speech therapy from [facility] can be cancelled. Pt prefers to continue with [facility] since this is easier for them since they have so many other appts that they go to

## 2019-04-12 ENCOUNTER — Other Ambulatory Visit: Payer: Self-pay | Admitting: Family

## 2019-04-12 DIAGNOSIS — E785 Hyperlipidemia, unspecified: Secondary | ICD-10-CM

## 2019-04-15 ENCOUNTER — Ambulatory Visit (HOSPITAL_COMMUNITY): Payer: Medicare Other | Admitting: Speech Pathology

## 2019-04-15 ENCOUNTER — Telehealth (HOSPITAL_COMMUNITY): Payer: Self-pay | Admitting: Speech Pathology

## 2019-04-15 DIAGNOSIS — C320 Malignant neoplasm of glottis: Secondary | ICD-10-CM | POA: Diagnosis not present

## 2019-04-15 DIAGNOSIS — C328 Malignant neoplasm of overlapping sites of larynx: Secondary | ICD-10-CM | POA: Diagnosis not present

## 2019-04-15 DIAGNOSIS — R1310 Dysphagia, unspecified: Secondary | ICD-10-CM | POA: Diagnosis not present

## 2019-04-15 DIAGNOSIS — Z431 Encounter for attention to gastrostomy: Secondary | ICD-10-CM | POA: Diagnosis not present

## 2019-04-15 DIAGNOSIS — K9423 Gastrostomy malfunction: Secondary | ICD-10-CM | POA: Diagnosis not present

## 2019-04-15 DIAGNOSIS — Z430 Encounter for attention to tracheostomy: Secondary | ICD-10-CM | POA: Diagnosis not present

## 2019-04-15 NOTE — Telephone Encounter (Signed)
Pt is getting home health to help with his swallowing and this apptment will be cx and the referral closed.

## 2019-04-16 ENCOUNTER — Other Ambulatory Visit: Payer: Self-pay | Admitting: Radiation Oncology

## 2019-04-16 DIAGNOSIS — Z430 Encounter for attention to tracheostomy: Secondary | ICD-10-CM | POA: Diagnosis not present

## 2019-04-16 DIAGNOSIS — R911 Solitary pulmonary nodule: Secondary | ICD-10-CM

## 2019-04-16 DIAGNOSIS — K9423 Gastrostomy malfunction: Secondary | ICD-10-CM | POA: Diagnosis not present

## 2019-04-16 DIAGNOSIS — C320 Malignant neoplasm of glottis: Secondary | ICD-10-CM

## 2019-04-16 DIAGNOSIS — C328 Malignant neoplasm of overlapping sites of larynx: Secondary | ICD-10-CM | POA: Diagnosis not present

## 2019-04-16 DIAGNOSIS — Z431 Encounter for attention to gastrostomy: Secondary | ICD-10-CM | POA: Diagnosis not present

## 2019-04-16 DIAGNOSIS — R1310 Dysphagia, unspecified: Secondary | ICD-10-CM | POA: Diagnosis not present

## 2019-04-17 ENCOUNTER — Telehealth: Payer: Self-pay | Admitting: *Deleted

## 2019-04-17 DIAGNOSIS — Z8521 Personal history of malignant neoplasm of larynx: Secondary | ICD-10-CM | POA: Diagnosis not present

## 2019-04-17 NOTE — Telephone Encounter (Signed)
CALLED PATIENT TO INFORM OF APPT. WITH DR. ICARD ON 04-24-19- ARRIVAL TIME- 9:20 AM , ADDRESS- [address], PATIENT TO BRING INSURANCE CARD AND PHOTO ID, PATIENT WILL BE NOTIFIED BY DR. ICARD'S OFFICE ON 04-23-19 FOR COVID SCREENING, SPOKE WITH PATIENT AND HE IS AWARE OF THIS APPT.

## 2019-04-22 ENCOUNTER — Inpatient Hospital Stay: Payer: Medicare Other | Attending: Hematology

## 2019-04-22 ENCOUNTER — Other Ambulatory Visit: Payer: Self-pay

## 2019-04-22 NOTE — Progress Notes (Signed)
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  Patient received PEG in July 2020.  Noted per Dr Pearlie Oyster note two lung nodules.  Noted MBSS on 12/30 and SLP recommended puree diet with honey thick liquids with tube feeding to provide majority of nutritional needs.   Met with patient in clinic this am. Patient reports that SLP is coming to his house 2 times per week.  Reports that he is consuming baby foods and thickening liquids to honey consistency.  Noted on 1/24 drank 4 oz of honey thick liquids and 4oz baby food pears.  On 1/22 ate 4 oz of mixed vegetables (puree-baby food) and 2.5 oz of puree ham.  On 1/17 drank 8 oz of honey thick peach tea and baby food bananas.  On 1/15 ate 4oz of puree mixed vegetables and carrots.    Patient reports that he is taking 4 bottles of [employer] peptide formula via feeding tube using continuous pump.  Usually turns pump on at 8am and stops at 8pm.  Reports that all 4 cartons are used within this time frame.  Gives 271m of water at 8am, 2pm and 8pm with medications.  Also gives prostat 368mat 8am and 8pm via tube.  Reports that he is urinating without difficulty and has normal bowel movement about 2 times per day.    Reports that he has enough enteral supplies.  Medications: reviewed  Labs: reviewed  Anthropometrics:   Weight taken today in RD office 143 lb 6 oz increased from 141 lb on 1/13.  Patient was wearing light weight jacket.     Estimated Energy Needs  Kcals: [id number]: 115-130 g Fluid: > 2 L  NUTRITION DIAGNOSIS: Inadequate oral intake continues    INTERVENTION:  Patient to continue 4 bottles of [employer] 1.5 peptide via pump and prostat 3071mID. Recommend adding additional 240m15mush (adding around 5pm) for total of 240ml49mimes per day.  Patient verbalized understanding.   Continue to take puree foods and honey thick liquids per SLP recommendations.  Patient currently not taking in  enough orally to decrease tube feeding at this time.   Patient has contact information  MONITORING, EVALUATION, GOAL:  Patient will tolerate adequate calories and protein to promote weight maintenance/gain   NEXT VISIT: Feb 23rd for weight check, oral intake, TF   Suhail Peloquin B. AllenZenia Resides LAlmena RMesa Vistastered Dietitian [phone])

## 2019-04-23 ENCOUNTER — Ambulatory Visit (INDEPENDENT_AMBULATORY_CARE_PROVIDER_SITE_OTHER): Payer: Medicare Other | Admitting: Pulmonary Disease

## 2019-04-23 ENCOUNTER — Encounter: Payer: Self-pay | Admitting: Pulmonary Disease

## 2019-04-23 VITALS — BP 124/70 | HR 97 | Ht 69.0 in | Wt 144.0 lb

## 2019-04-23 DIAGNOSIS — R911 Solitary pulmonary nodule: Secondary | ICD-10-CM | POA: Insufficient documentation

## 2019-04-23 DIAGNOSIS — Z930 Tracheostomy status: Secondary | ICD-10-CM

## 2019-04-23 DIAGNOSIS — R918 Other nonspecific abnormal finding of lung field: Secondary | ICD-10-CM | POA: Diagnosis not present

## 2019-04-23 DIAGNOSIS — E43 Unspecified severe protein-calorie malnutrition: Secondary | ICD-10-CM | POA: Diagnosis not present

## 2019-04-23 DIAGNOSIS — C329 Malignant neoplasm of larynx, unspecified: Secondary | ICD-10-CM | POA: Diagnosis not present

## 2019-04-23 DIAGNOSIS — C760 Malignant neoplasm of head, face and neck: Secondary | ICD-10-CM

## 2019-04-23 DIAGNOSIS — C320 Malignant neoplasm of glottis: Secondary | ICD-10-CM

## 2019-04-23 NOTE — Progress Notes (Signed)
Synopsis: Referred in January 2021 for left upper lobe opacity concerning for malignancy by Lonie Peak, MD  Subjective:   PATIENT ID: Logan Price GENDER: male DOB: November 22, 1945, MRN: [id number]  Chief Complaint  Patient presents with  . Consult    Pt is here today due to a mass being found on lung. Pt denies any complaints of cough, SOB, or CP. Pt does have a trach.    This is a 74 year old gentleman with a past medical history of squamous cell carcinoma of the head and neck.  Patient is followed by radiation oncology.  Patient underwent PEG tube placement, tracheostomy tube placement and subsequently completed 7 weeks of radiation to the head and neck.  Patient suffers from dysphagia due to this now.  Patient had recent follow-up with Dr. Basilio Cairo from radiation oncology.  Patient's last office note reviewed.  Patient had recent CT imaging of the chest April 02, 2019.  This revealed a 17 x 25 mm groundglass nodule within the upper lobe/lingula concerning for potential primary malignancy of the lung.  A PET scan was completed also on the same day which revealed PET avid uptake within the right upper lobe nodule 1.6 cm SUV of 5.3 and the left upper lobe/lingula measuring 2.4 cm with SUV max of 3.  Concerning for either metastatic disease and a potential primary pulmonary adenocarcinoma within the lingula.  Patient was referred to pulmonary for evaluation of electromagnetic navigational bronchoscopy and tissue sampling of the lingular groundglass opacity.  Patient seen today in the office with no complaints.  He does have his PMV in place over his cuffless trach he is able to communicate effectively.  Present with his wife today.  Overall anxious about the new findings of the PET scan but is interested in obtaining tissue diagnosis and would like to move forward as soon as possible.   Past Medical History:  Diagnosis Date  . Diverticulitis   . Dysrhythmia 09/2018   episode of SVT while in  hospital  . 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 GERD (gastroesophageal reflux disease)   . 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 HTN (hypertension)   . Hyperlipidemia      Family History  Problem Relation Age of Onset  . Cancer Mother   . Hypertension Father   . Hypertension Sister   . Hypertension Brother   . Hypertension Sister   . Aneurysm Brother 32       brain  . Colon cancer Neg Hx   . Colon polyps Neg Hx      Past Surgical History:  Procedure Laterality Date  . BIOPSY  05/04/2015   Procedure: BIOPSY;  Surgeon: West Bali, MD;  Location: [facility];  Service: Endoscopy;;  bx's of ileocecal valve   . COLONOSCOPY WITH PROPOFOL N/A 05/04/2015   Dr. Darrick Penna: normal appearing ileum with prominent IC valve with tubular adenomas, moderate diverticulosis in sigmoid colon, ascending colon, and retum. Moderate sized internal hemorrhoids. Surveillance in 5 years  . ESOPHAGOGASTRODUODENOSCOPY (EGD) WITH PROPOFOL N/A 12/19/2016   Procedure: ESOPHAGOGASTRODUODENOSCOPY (EGD) WITH PROPOFOL;  Surgeon: West Bali, MD;  Location: [facility];  Service: Endoscopy;  Laterality: N/A;  11:30am  . FLEXIBLE SIGMOIDOSCOPY N/A 12/10/2015   hemorrhoid banding X 3   . HEMORRHOID BANDING N/A 12/10/2015   Procedure: HEMORRHOID BANDING;  Surgeon: West Bali, MD;  Location: [facility];  Service: Endoscopy;  Laterality: N/A;  1:30 PM  . INSERTION  OF SUPRAPUBIC CATHETER N/A 02/24/2019   Procedure: INSERTION OF SUPRAPUBIC CATHETER;  Surgeon: Ihor Gully, MD;  Location: [facility];  Service: Urology;  Laterality: N/A;  . IR CM INJ ANY COLONIC TUBE W/FLUORO  10/30/2018  . IR GASTROSTOMY TUBE MOD SED  10/04/2018  . IR IMAGING GUIDED PORT INSERTION  10/04/2018  . IR REMOVAL TUN ACCESS W/ PORT W/O FL MOD SED  10/14/2018  . IR REPLACE G-TUBE SIMPLE WO FLUORO  11/03/2018  . LAPAROSCOPIC INSERTION GASTROSTOMY TUBE Left 10/07/2018   Procedure: LAPAROSCOPIC  GASTROSTOMY TUBE;  Surgeon:  Kinsinger, De Blanch, MD;  Location: [facility];  Service: General;  Laterality: Left;  Marland Kitchen MICROLARYNGOSCOPY N/A 09/27/2018   Procedure: MICRO DIRECT LARYNGOSCOPY WITH BIOPSY;  Surgeon: Newman Pies, MD;  Location: [facility];  Service: ENT;  Laterality: N/A;  . None to date     As of 04/14/15  . POLYPECTOMY  05/04/2015   Procedure: POLYPECTOMY;  Surgeon: West Bali, MD;  Location: [facility];  Service: Endoscopy;;  descending colon polyp, ascending colon polyp  . SAVORY DILATION N/A 12/19/2016   Procedure: SAVORY DILATION;  Surgeon: West Bali, MD;  Location: [facility];  Service: Endoscopy;  Laterality: N/A;  . TRACHEOSTOMY TUBE PLACEMENT N/A 09/27/2018   Procedure: AWAKE TRACHEOSTOMY;  Surgeon: Newman Pies, MD;  Location: [facility];  Service: ENT;  Laterality: N/A;  . TRANSURETHRAL RESECTION OF PROSTATE N/A 02/24/2019   Procedure: TRANSURETHRAL RESECTION OF THE PROSTATE (TURP);  Surgeon: Ihor Gully, MD;  Location: [facility];  Service: Urology;  Laterality: N/A;    Social History   Socioeconomic History  . Marital status: Married    Spouse name: Not on file  . Number of children: Not on file  . Years of education: GED  . Highest education level: GED or equivalent  Occupational History  . Occupation: Retired    Comment: [employer]  Tobacco Use  . Smoking status: Former Smoker    Packs/day: 1.00    Years: 40.00    Pack years: 40.00    Types: Cigarettes    Quit date: 10/09/2006    Years since quitting: 12.5  . Smokeless tobacco: Never Used  Substance and Sexual Activity  . Alcohol use: Not Currently    Alcohol/week: 0.0 standard drinks    Comment: he denies 01/06/19  . Drug use: No  . Sexual activity: Yes    Birth control/protection: None  Other Topics Concern  . Not on file  Social History Narrative  . Not on file   Social Determinants of Health   Financial Resource Strain:   . Difficulty of Paying Living Expenses: Not on file  Food Insecurity:   . Worried  About Programme researcher, broadcasting/film/video in the Last Year: Not on file  . Ran Out of Food in the Last Year: Not on file  Transportation Needs:   . Lack of Transportation (Medical): Not on file  . Lack of Transportation (Non-Medical): Not on file  Physical Activity:   . Days of Exercise per Week: Not on file  . Minutes of Exercise per Session: Not on file  Stress:   . Feeling of Stress : Not on file  Social Connections:   . Frequency of Communication with Friends and Family: Not on file  . Frequency of Social Gatherings with Friends and Family: Not on file  . Attends Religious Services: Not on file  . Active Member of Clubs or Organizations: Not on file  . Attends  Club or Organization Meetings: Not on file  . Marital Status: Not on file  Intimate Partner Violence: Not At Risk  . Fear of Current or Ex-Partner: No  . Emotionally Abused: No  . Physically Abused: No  . Sexually Abused: No     No Known Allergies   Outpatient Medications Prior to Visit  Medication Sig Dispense Refill  . Amino Acids-Protein Hydrolys (FEEDING SUPPLEMENT, PRO-STAT SUGAR FREE 64,) LIQD Place 30 mLs into feeding tube 2 (two) times daily. 887 mL 15  . doxazosin (CARDURA) 1 MG tablet Place 1 tablet (1 mg total) into feeding tube daily. (Patient taking differently: Place 1 mg into feeding tube daily at 12 noon. ) 30 tablet 5  . Nutritional Supplements (KATE FARMS PEPTIDE 1.5) LIQD 325 mLs by PEG Tube route continuous. (Patient taking differently: 325 mLs by PEG Tube route 4 (four) times daily. ) 325 mL 12  . pantoprazole (PROTONIX) 40 MG tablet 1 po 30 mins prior to first meal (Patient taking differently: Take 40 mg by mouth daily. ) 90 tablet 3  . simvastatin (ZOCOR) 20 MG tablet Take 1 tablet (20 mg total) by mouth daily at 6 PM. 90 tablet 1  . Water For Irrigation, Sterile (FREE WATER) SOLN Place 200 mLs into feeding tube every 4 (four) hours.     No facility-administered medications prior to visit.    Review of Systems    Constitutional: Negative for chills, fever, malaise/fatigue and weight loss.  HENT: Negative for hearing loss, sore throat and tinnitus.   Eyes: Negative for blurred vision and double vision.  Respiratory: Positive for cough, sputum production and shortness of breath. Negative for hemoptysis, wheezing and stridor.   Cardiovascular: Negative for chest pain, palpitations, orthopnea, leg swelling and PND.  Gastrointestinal: Negative for abdominal pain, constipation, diarrhea, heartburn, nausea and vomiting.  Genitourinary: Negative for dysuria, hematuria and urgency.  Musculoskeletal: Negative for joint pain and myalgias.  Skin: Negative for itching and rash.  Neurological: Negative for dizziness, tingling, weakness and headaches.  Endo/Heme/Allergies: Negative for environmental allergies. Does not bruise/bleed easily.  Psychiatric/Behavioral: Negative for depression. The patient is not nervous/anxious and does not have insomnia.   All other systems reviewed and are negative.    Objective:  Physical Exam Vitals reviewed.  Constitutional:      General: He is not in acute distress.    Appearance: He is well-developed.  HENT:     Head: Normocephalic and atraumatic.     Mouth/Throat:     Pharynx: No oropharyngeal exudate.  Eyes:     Conjunctiva/sclera: Conjunctivae normal.     Pupils: Pupils are equal, round, and reactive to light.  Neck:     Vascular: No JVD.     Trachea: No tracheal deviation.     Comments: Loss of supraclavicular fat Tracheostomy tube in place no significant secretions Cardiovascular:     Rate and Rhythm: Normal rate and regular rhythm.     Heart sounds: S1 normal and S2 normal.     Comments: Distant heart tones Pulmonary:     Effort: No tachypnea or accessory muscle usage.     Breath sounds: No stridor. Decreased breath sounds (throughout all lung fields) present. No wheezing, rhonchi or rales.  Abdominal:     General: Bowel sounds are normal. There is no  distension.     Palpations: Abdomen is soft.     Tenderness: There is no abdominal tenderness.  Musculoskeletal:        General: Deformity (muscle wasting )  present.  Skin:    General: Skin is warm and dry.     Capillary Refill: Capillary refill takes less than 2 seconds.     Findings: No rash.  Neurological:     Mental Status: He is alert and oriented to person, place, and time.  Psychiatric:        Behavior: Behavior normal.      Vitals:   04/23/19 1441  BP: 124/70  Pulse: 97  SpO2: 95%  Weight: 144 lb (65.3 kg)  Height: 5\' 9"  (1.753 m)   95% on RA BMI Readings from Last 3 Encounters:  04/23/19 21.27 kg/m  04/09/19 20.82 kg/m  03/09/19 19.63 kg/m   Wt Readings from Last 3 Encounters:  04/23/19 144 lb (65.3 kg)  04/09/19 141 lb (64 kg)  03/09/19 132 lb 15 oz (60.3 kg)     CBC    Component Value Date/Time   WBC 5.5 04/09/2019 1238   WBC 6.1 03/08/2019 2230   RBC 4.35 04/09/2019 1238   RBC 4.42 03/08/2019 2230   HGB 12.3 (L) 04/09/2019 1238   HCT 37.3 (L) 04/09/2019 1238   PLT 279 04/09/2019 1238   MCV 86 04/09/2019 1238   MCH 28.3 04/09/2019 1238   MCH 28.3 03/08/2019 2230   MCHC 33.0 04/09/2019 1238   MCHC 31.3 03/08/2019 2230   RDW 13.0 04/09/2019 1238   LYMPHSABS 0.7 04/09/2019 1238   MONOABS 0.6 03/08/2019 2230   EOSABS 0.1 04/09/2019 1238   BASOSABS 0.0 04/09/2019 1238     Chest Imaging: CT chest contrast 04/02/2019: Right middle lobe lung nodule, new, left upper lobe lingula groundglass opacity concerning for primary malignancy. The patient's images have been independently reviewed by me.    Nuclear medicine pet imaging 04/02/2019: PET avid uptake within the right middle lobe nodule as well as the left upper lobe lingular groundglass opacity both concerning for either primary legacy versus metastatic disease. The patient's images have been independently reviewed by me.    Pulmonary Functions Testing Results: No flowsheet data found.       Assessment & Plan:     ICD-10-CM   1. Lung mass  R91.8 Ambulatory referral to Pulmonology  2. Ground glass opacity present on imaging of lung  R91.8 Ambulatory referral to Pulmonology  3. Head and neck cancer (HCC)  C76.0   4. 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 Protein-calorie malnutrition, severe  E43   7. Tracheostomy tube present (HCC)  Z93.0     Assessment:   This is a 74 year old gentleman, history of laryngeal cancer status post tracheostomy tube placement, PEG tube placement, 7 weeks of radiation treatments now suffering from dysphagia and chronic respiratory failure requiring tracheostomy tube placement.  New CT imaging and nuclear medicine pet imaging as reviewed above concerning for a left-sided lingular primary lung cancer as well as potential metastasis versus synchronous/metachronous primaries in the right middle lobe.  Review of the images likely the right sided lesion is more amendable to CT-guided biopsy.  This has been discussed with Dr. Basilio Cairo.  There is already a plan for this in place. Discussed today the risk versus benefits versus alternatives of proceeding with invasive tissue diagnostic bronchoscopy to include video bronchoscopy with electromagnetic navigational bronchoscopy to the lingular groundglass opacity which is concerning for a primary adenocarcinoma based on imaging characteristics.  We discussed the risk of bleeding as well as pneumothorax.  Patient is amendable to proceeding  with tissue diagnosis as soon as possible we will also consider the use of radial endobronchial ultrasound as well as potential for fiducial marker placement.  Plan Following Extensive Data Review & Interpretation:  . I reviewed prior external note(s) from 04/02/2019 Dr. Basilio Cairo from radiation oncology . I reviewed the result(s) of CMP, serum creatinine 0.78, sodium 143, potassium 4.9, CBC hemoglobin 12.3, white blood cell count 5.5 . I  have ordered ambulatory referral to pulmonary to schedule outpatient video bronchoscopy.  Hopefully this will be scheduled for April 29, 2019.  Independent interpretation of tests . Review of patient's CT imaging of the chest as well as nuclear medicine PET images revealed abnormalities as described above.. The patient's images have been independently reviewed by me.    We will need to obtain super D CT formatting for the CT chest that was completed on 04/02/2019 prior to the scheduled procedure date. Patient also needs SARS-CoV-2 testing prior to this.  Patient return to our clinic following bronchoscopy. Please see preop bronc orders.   Josephine Igo, DO [facility] 04/23/2019 3:11 PM

## 2019-04-23 NOTE — H&P (View-Only) (Signed)
Synopsis: Referred in January 2021 for left upper lobe opacity concerning for malignancy by Lonie Peak, MD  Subjective:   PATIENT ID: Logan Price GENDER: male DOB: November 22, 1945, MRN: [id number]  Chief Complaint  Patient presents with  . Consult    Pt is here today due to a mass being found on lung. Pt denies any complaints of cough, SOB, or CP. Pt does have a trach.    This is a 74 year old gentleman with a past medical history of squamous cell carcinoma of the head and neck.  Patient is followed by radiation oncology.  Patient underwent PEG tube placement, tracheostomy tube placement and subsequently completed 7 weeks of radiation to the head and neck.  Patient suffers from dysphagia due to this now.  Patient had recent follow-up with Dr. Basilio Cairo from radiation oncology.  Patient's last office note reviewed.  Patient had recent CT imaging of the chest April 02, 2019.  This revealed a 17 x 25 mm groundglass nodule within the upper lobe/lingula concerning for potential primary malignancy of the lung.  A PET scan was completed also on the same day which revealed PET avid uptake within the right upper lobe nodule 1.6 cm SUV of 5.3 and the left upper lobe/lingula measuring 2.4 cm with SUV max of 3.  Concerning for either metastatic disease and a potential primary pulmonary adenocarcinoma within the lingula.  Patient was referred to pulmonary for evaluation of electromagnetic navigational bronchoscopy and tissue sampling of the lingular groundglass opacity.  Patient seen today in the office with no complaints.  He does have his PMV in place over his cuffless trach he is able to communicate effectively.  Present with his wife today.  Overall anxious about the new findings of the PET scan but is interested in obtaining tissue diagnosis and would like to move forward as soon as possible.   Past Medical History:  Diagnosis Date  . Diverticulitis   . Dysrhythmia 09/2018   episode of SVT while in  hospital  . 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 GERD (gastroesophageal reflux disease)   . 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 HTN (hypertension)   . Hyperlipidemia      Family History  Problem Relation Age of Onset  . Cancer Mother   . Hypertension Father   . Hypertension Sister   . Hypertension Brother   . Hypertension Sister   . Aneurysm Brother 32       brain  . Colon cancer Neg Hx   . Colon polyps Neg Hx      Past Surgical History:  Procedure Laterality Date  . BIOPSY  05/04/2015   Procedure: BIOPSY;  Surgeon: West Bali, MD;  [facility];  Service: Endoscopy;;  bx's of ileocecal valve   . COLONOSCOPY WITH PROPOFOL N/A 05/04/2015   Dr. Darrick Penna: normal appearing ileum with prominent IC valve with tubular adenomas, moderate diverticulosis in sigmoid colon, ascending colon, and retum. Moderate sized internal hemorrhoids. Surveillance in 5 years  . ESOPHAGOGASTRODUODENOSCOPY (EGD) WITH PROPOFOL N/A 12/19/2016   Procedure: ESOPHAGOGASTRODUODENOSCOPY (EGD) WITH PROPOFOL;  Surgeon: West Bali, MD;  [facility];  Service: Endoscopy;  Laterality: N/A;  11:30am  . FLEXIBLE SIGMOIDOSCOPY N/A 12/10/2015   hemorrhoid banding X 3   . HEMORRHOID BANDING N/A 12/10/2015   Procedure: HEMORRHOID BANDING;  Surgeon: West Bali, MD;  [facility]: [facility];  Service: Endoscopy;  Laterality: N/A;  1:30 PM  . INSERTION  OF SUPRAPUBIC CATHETER N/A 02/24/2019   Procedure: INSERTION OF SUPRAPUBIC CATHETER;  Surgeon: Ihor Gully, MD;  Location: [facility];  Service: Urology;  Laterality: N/A;  . IR CM INJ ANY COLONIC TUBE W/FLUORO  10/30/2018  . IR GASTROSTOMY TUBE MOD SED  10/04/2018  . IR IMAGING GUIDED PORT INSERTION  10/04/2018  . IR REMOVAL TUN ACCESS W/ PORT W/O FL MOD SED  10/14/2018  . IR REPLACE G-TUBE SIMPLE WO FLUORO  11/03/2018  . LAPAROSCOPIC INSERTION GASTROSTOMY TUBE Left 10/07/2018   Procedure: LAPAROSCOPIC  GASTROSTOMY TUBE;  Surgeon:  Kinsinger, De Blanch, MD;  Location: [facility];  Service: General;  Laterality: Left;  Marland Kitchen MICROLARYNGOSCOPY N/A 09/27/2018   Procedure: MICRO DIRECT LARYNGOSCOPY WITH BIOPSY;  Surgeon: Newman Pies, MD;  Location: [facility] OR;  Service: ENT;  Laterality: N/A;  . None to date     As of 04/14/15  . POLYPECTOMY  05/04/2015   Procedure: POLYPECTOMY;  Surgeon: West Bali, MD;  [facility];  Service: Endoscopy;;  descending colon polyp, ascending colon polyp  . SAVORY DILATION N/A 12/19/2016   Procedure: SAVORY DILATION;  Surgeon: West Bali, MD;  [facility];  Service: Endoscopy;  Laterality: N/A;  . TRACHEOSTOMY TUBE PLACEMENT N/A 09/27/2018   Procedure: AWAKE TRACHEOSTOMY;  Surgeon: Newman Pies, MD;  Location: [facility];  Service: ENT;  Laterality: N/A;  . TRANSURETHRAL RESECTION OF PROSTATE N/A 02/24/2019   Procedure: TRANSURETHRAL RESECTION OF THE PROSTATE (TURP);  Surgeon: Ihor Gully, MD;  Location: [facility];  Service: Urology;  Laterality: N/A;    Social History   Socioeconomic History  . Marital status: Married    Spouse name: Not on file  . Number of children: Not on file  . Years of education: GED  . Highest education level: GED or equivalent  Occupational History  . Occupation: Retired    Comment: [employer]  Tobacco Use  . Smoking status: Former Smoker    Packs/day: 1.00    Years: 40.00    Pack years: 40.00    Types: Cigarettes    Quit date: 10/09/2006    Years since quitting: 12.5  . Smokeless tobacco: Never Used  Substance and Sexual Activity  . Alcohol use: Not Currently    Alcohol/week: 0.0 standard drinks    Comment: he denies 01/06/19  . Drug use: No  . Sexual activity: Yes    Birth control/protection: None  Other Topics Concern  . Not on file  Social History Narrative  . Not on file   Social Determinants of Health   Financial Resource Strain:   . Difficulty of Paying Living Expenses: Not on file  Food Insecurity:   . Worried  About Programme researcher, broadcasting/film/video in the Last Year: Not on file  . Ran Out of Food in the Last Year: Not on file  Transportation Needs:   . Lack of Transportation (Medical): Not on file  . Lack of Transportation (Non-Medical): Not on file  Physical Activity:   . Days of Exercise per Week: Not on file  . Minutes of Exercise per Session: Not on file  Stress:   . Feeling of Stress : Not on file  Social Connections:   . Frequency of Communication with Friends and Family: Not on file  . Frequency of Social Gatherings with Friends and Family: Not on file  . Attends Religious Services: Not on file  . Active Member of Clubs or Organizations: Not on file  . Attends  Club or Organization Meetings: Not on file  . Marital Status: Not on file  Intimate Partner Violence: Not At Risk  . Fear of Current or Ex-Partner: No  . Emotionally Abused: No  . Physically Abused: No  . Sexually Abused: No     No Known Allergies   Outpatient Medications Prior to Visit  Medication Sig Dispense Refill  . Amino Acids-Protein Hydrolys (FEEDING SUPPLEMENT, PRO-STAT SUGAR FREE 64,) LIQD Place 30 mLs into feeding tube 2 (two) times daily. 887 mL 15  . doxazosin (CARDURA) 1 MG tablet Place 1 tablet (1 mg total) into feeding tube daily. (Patient taking differently: Place 1 mg into feeding tube daily at 12 noon. ) 30 tablet 5  . Nutritional Supplements (KATE FARMS PEPTIDE 1.5) LIQD 325 mLs by PEG Tube route continuous. (Patient taking differently: 325 mLs by PEG Tube route 4 (four) times daily. ) 325 mL 12  . pantoprazole (PROTONIX) 40 MG tablet 1 po 30 mins prior to first meal (Patient taking differently: Take 40 mg by mouth daily. ) 90 tablet 3  . simvastatin (ZOCOR) 20 MG tablet Take 1 tablet (20 mg total) by mouth daily at 6 PM. 90 tablet 1  . Water For Irrigation, Sterile (FREE WATER) SOLN Place 200 mLs into feeding tube every 4 (four) hours.     No facility-administered medications prior to visit.    Review of Systems    Constitutional: Negative for chills, fever, malaise/fatigue and weight loss.  HENT: Negative for hearing loss, sore throat and tinnitus.   Eyes: Negative for blurred vision and double vision.  Respiratory: Positive for cough, sputum production and shortness of breath. Negative for hemoptysis, wheezing and stridor.   Cardiovascular: Negative for chest pain, palpitations, orthopnea, leg swelling and PND.  Gastrointestinal: Negative for abdominal pain, constipation, diarrhea, heartburn, nausea and vomiting.  Genitourinary: Negative for dysuria, hematuria and urgency.  Musculoskeletal: Negative for joint pain and myalgias.  Skin: Negative for itching and rash.  Neurological: Negative for dizziness, tingling, weakness and headaches.  Endo/Heme/Allergies: Negative for environmental allergies. Does not bruise/bleed easily.  Psychiatric/Behavioral: Negative for depression. The patient is not nervous/anxious and does not have insomnia.   All other systems reviewed and are negative.    Objective:  Physical Exam Vitals reviewed.  Constitutional:      General: He is not in acute distress.    Appearance: He is well-developed.  HENT:     Head: Normocephalic and atraumatic.     Mouth/Throat:     Pharynx: No oropharyngeal exudate.  Eyes:     Conjunctiva/sclera: Conjunctivae normal.     Pupils: Pupils are equal, round, and reactive to light.  Neck:     Vascular: No JVD.     Trachea: No tracheal deviation.     Comments: Loss of supraclavicular fat Tracheostomy tube in place no significant secretions Cardiovascular:     Rate and Rhythm: Normal rate and regular rhythm.     Heart sounds: S1 normal and S2 normal.     Comments: Distant heart tones Pulmonary:     Effort: No tachypnea or accessory muscle usage.     Breath sounds: No stridor. Decreased breath sounds (throughout all lung fields) present. No wheezing, rhonchi or rales.  Abdominal:     General: Bowel sounds are normal. There is no  distension.     Palpations: Abdomen is soft.     Tenderness: There is no abdominal tenderness.  Musculoskeletal:        General: Deformity (muscle wasting )  present.  Skin:    General: Skin is warm and dry.     Capillary Refill: Capillary refill takes less than 2 seconds.     Findings: No rash.  Neurological:     Mental Status: He is alert and oriented to person, place, and time.  Psychiatric:        Behavior: Behavior normal.      Vitals:   04/23/19 1441  BP: 124/70  Pulse: 97  SpO2: 95%  Weight: 144 lb (65.3 kg)  Height: 5\' 9"  (1.753 m)   95% on RA BMI Readings from Last 3 Encounters:  04/23/19 21.27 kg/m  04/09/19 20.82 kg/m  03/09/19 19.63 kg/m   Wt Readings from Last 3 Encounters:  04/23/19 144 lb (65.3 kg)  04/09/19 141 lb (64 kg)  03/09/19 132 lb 15 oz (60.3 kg)     CBC    Component Value Date/Time   WBC 5.5 04/09/2019 1238   WBC 6.1 03/08/2019 2230   RBC 4.35 04/09/2019 1238   RBC 4.42 03/08/2019 2230   HGB 12.3 (L) 04/09/2019 1238   HCT 37.3 (L) 04/09/2019 1238   PLT 279 04/09/2019 1238   MCV 86 04/09/2019 1238   MCH 28.3 04/09/2019 1238   MCH 28.3 03/08/2019 2230   MCHC 33.0 04/09/2019 1238   MCHC 31.3 03/08/2019 2230   RDW 13.0 04/09/2019 1238   LYMPHSABS 0.7 04/09/2019 1238   MONOABS 0.6 03/08/2019 2230   EOSABS 0.1 04/09/2019 1238   BASOSABS 0.0 04/09/2019 1238     Chest Imaging: CT chest contrast 04/02/2019: Right middle lobe lung nodule, new, left upper lobe lingula groundglass opacity concerning for primary malignancy. The patient's images have been independently reviewed by me.    Nuclear medicine pet imaging 04/02/2019: PET avid uptake within the right middle lobe nodule as well as the left upper lobe lingular groundglass opacity both concerning for either primary legacy versus metastatic disease. The patient's images have been independently reviewed by me.    Pulmonary Functions Testing Results: No flowsheet data found.       Assessment & Plan:     ICD-10-CM   1. Lung mass  R91.8 Ambulatory referral to Pulmonology  2. Ground glass opacity present on imaging of lung  R91.8 Ambulatory referral to Pulmonology  3. Head and neck cancer (HCC)  C76.0   4. 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 Protein-calorie malnutrition, severe  E43   7. Tracheostomy tube present (HCC)  Z93.0     Assessment:   This is a 74 year old gentleman, history of laryngeal cancer status post tracheostomy tube placement, PEG tube placement, 7 weeks of radiation treatments now suffering from dysphagia and chronic respiratory failure requiring tracheostomy tube placement.  New CT imaging and nuclear medicine pet imaging as reviewed above concerning for a left-sided lingular primary lung cancer as well as potential metastasis versus synchronous/metachronous primaries in the right middle lobe.  Review of the images likely the right sided lesion is more amendable to CT-guided biopsy.  This has been discussed with Dr. Basilio Cairo.  There is already a plan for this in place. Discussed today the risk versus benefits versus alternatives of proceeding with invasive tissue diagnostic bronchoscopy to include video bronchoscopy with electromagnetic navigational bronchoscopy to the lingular groundglass opacity which is concerning for a primary adenocarcinoma based on imaging characteristics.  We discussed the risk of bleeding as well as pneumothorax.  Patient is amendable to proceeding  with tissue diagnosis as soon as possible we will also consider the use of radial endobronchial ultrasound as well as potential for fiducial marker placement.  Plan Following Extensive Data Review & Interpretation:  . I reviewed prior external note(s) from 04/02/2019 Dr. Basilio Cairo from radiation oncology . I reviewed the result(s) of CMP, serum creatinine 0.78, sodium 143, potassium 4.9, CBC hemoglobin 12.3, white blood cell count 5.5 . I  have ordered ambulatory referral to pulmonary to schedule outpatient video bronchoscopy.  Hopefully this will be scheduled for April 29, 2019.  Independent interpretation of tests . Review of patient's CT imaging of the chest as well as nuclear medicine PET images revealed abnormalities as described above.. The patient's images have been independently reviewed by me.    We will need to obtain super D CT formatting for the CT chest that was completed on 04/02/2019 prior to the scheduled procedure date. Patient also needs SARS-CoV-2 testing prior to this.  Patient return to our clinic following bronchoscopy. Please see preop bronc orders.   Josephine Igo, DO [facility] 04/23/2019 3:11 PM

## 2019-04-23 NOTE — Patient Instructions (Signed)
Thank you for visiting Dr. Valeta Harms at [facility]. Today we recommend the following:  Orders Placed This Encounter  Procedures  . Ambulatory referral to Pulmonology   Bronchoscopy to be scheduled for next week.   Return in about 4 weeks (around 05/21/2019).    Please do your part to reduce the spread of COVID-19.

## 2019-04-24 ENCOUNTER — Encounter (HOSPITAL_COMMUNITY): Payer: Self-pay | Admitting: Radiology

## 2019-04-24 ENCOUNTER — Institutional Professional Consult (permissible substitution): Payer: Medicare Other | Admitting: Pulmonary Disease

## 2019-04-24 ENCOUNTER — Telehealth: Payer: Self-pay | Admitting: *Deleted

## 2019-04-24 DIAGNOSIS — R1310 Dysphagia, unspecified: Secondary | ICD-10-CM | POA: Diagnosis not present

## 2019-04-24 DIAGNOSIS — C328 Malignant neoplasm of overlapping sites of larynx: Secondary | ICD-10-CM | POA: Diagnosis not present

## 2019-04-24 DIAGNOSIS — Z431 Encounter for attention to gastrostomy: Secondary | ICD-10-CM | POA: Diagnosis not present

## 2019-04-24 DIAGNOSIS — Z430 Encounter for attention to tracheostomy: Secondary | ICD-10-CM | POA: Diagnosis not present

## 2019-04-24 DIAGNOSIS — K9423 Gastrostomy malfunction: Secondary | ICD-10-CM | POA: Diagnosis not present

## 2019-04-24 DIAGNOSIS — C320 Malignant neoplasm of glottis: Secondary | ICD-10-CM | POA: Diagnosis not present

## 2019-04-24 NOTE — Progress Notes (Signed)
Logan G[facility] Male, 74 y.o., May 25, 1945 MRN:  [id number] Phone:  [phone] Jerilynn Mages) PCP:  Sharion Balloon, FNP Primary Cvg:  Medicare/Medicare Part A And B Next Appt With [facility]-SCREENING 04/26/2019 at 10:20 AM  RE: CT Biopsy Received: Today Message Contents  Markus Daft, MD  Arlyn Leak for CT guided biopsy of right lung nodule.   [place]       Previous Messages   ----- Message -----  From: Garth Bigness D  Sent: 04/22/2019  4:59 PM EST  To: Ir Procedure Requests  Subject: FW: CT Biopsy                   Resubmitting per Earleen Newport request.  ----- Message -----  From: Corrie Mckusick, DO  Sent: 04/18/2019 11:06 AM EST  To: Jillyn Hidden  Subject: RE: CT Biopsy                   That must have been accident.    Isidore Moos off today to discuss.    We must discuss before approval.    Can we put this back on the general list next week when Lanell Persons is back to discuss? Either that or we can leave message for her to call the VIR doc of day [facility] to discuss.   JW  ----- Message -----  From: Garth Bigness D  Sent: 04/18/2019 10:37 AM EST  To: Corrie Mckusick, DO, Ir Procedure Requests  Subject: FW: CT Biopsy                   Still need review, I see that Dr. Earleen Newport hit done but not sure if it was by mistake or not.  ----- Message -----  From: Garth Bigness D  Sent: 04/17/2019  3:49 PM EST  To: Ir Procedure Requests  Subject: CT Biopsy                     Procedure: CT Biopsy   Reason: Lung nodule, Squamous cell carcinoma of glottis, 1.5 cm nodule in the anterior right upper lobe, new, suspicious   History: NM PET, CT in computer   Provider: Eppie Price   Provider Contact: [phone]

## 2019-04-24 NOTE — Telephone Encounter (Addendum)
A user error has taken place: encounter opened in error, closed for administrative reasons.

## 2019-04-25 ENCOUNTER — Telehealth: Payer: Self-pay | Admitting: General Surgery

## 2019-04-25 ENCOUNTER — Encounter (HOSPITAL_COMMUNITY): Payer: Self-pay | Admitting: Pulmonary Disease

## 2019-04-25 DIAGNOSIS — Z431 Encounter for attention to gastrostomy: Secondary | ICD-10-CM | POA: Diagnosis not present

## 2019-04-25 DIAGNOSIS — C320 Malignant neoplasm of glottis: Secondary | ICD-10-CM | POA: Diagnosis not present

## 2019-04-25 DIAGNOSIS — R1310 Dysphagia, unspecified: Secondary | ICD-10-CM | POA: Diagnosis not present

## 2019-04-25 DIAGNOSIS — K9423 Gastrostomy malfunction: Secondary | ICD-10-CM | POA: Diagnosis not present

## 2019-04-25 DIAGNOSIS — C328 Malignant neoplasm of overlapping sites of larynx: Secondary | ICD-10-CM | POA: Diagnosis not present

## 2019-04-25 DIAGNOSIS — Z430 Encounter for attention to tracheostomy: Secondary | ICD-10-CM | POA: Diagnosis not present

## 2019-04-25 NOTE — Telephone Encounter (Signed)
Dr. Valeta Harms, I received call from [facility] with pre-admission testing. She stated they contacted the patient about his video bronchoscopy on 04/29/19, However the patient stated he was told it was cancelled because he is having a CT biopsy on 04/30/19.   Looks like the provider it is under is Eppie Gibson.  Contact number for pre admission testing is [phone].

## 2019-04-26 ENCOUNTER — Other Ambulatory Visit (HOSPITAL_COMMUNITY)
Admission: RE | Admit: 2019-04-26 | Discharge: 2019-04-26 | Disposition: A | Discharge status: Home / Self Care | Payer: Medicare Other | Source: Ambulatory Visit | Attending: Pulmonary Disease | Admitting: Pulmonary Disease

## 2019-04-26 DIAGNOSIS — Z20822 Contact with and (suspected) exposure to covid-19: Secondary | ICD-10-CM | POA: Diagnosis not present

## 2019-04-26 DIAGNOSIS — Z01812 Encounter for preprocedural laboratory examination: Secondary | ICD-10-CM | POA: Insufficient documentation

## 2019-04-26 LAB — SARS CORONAVIRUS 2 (TAT 6-24 HRS): SARS Coronavirus 2: NEGATIVE

## 2019-04-26 NOTE — Telephone Encounter (Signed)
NO it is not cancelled.  It is for a different lesion and he needs both procedures completed.  The RUL lesion is for IR The bronchoscopy is for the left sided lesion  Garner Nash, DO [facility] 04/26/2019 12:09 PM

## 2019-04-28 ENCOUNTER — Other Ambulatory Visit: Payer: Self-pay

## 2019-04-28 ENCOUNTER — Encounter (HOSPITAL_COMMUNITY): Payer: Self-pay | Admitting: Pulmonary Disease

## 2019-04-28 NOTE — Telephone Encounter (Signed)
Spoke with Helene Kelp from pre-admission, she is aware and is going to call patient for screening  Nothing further needed at this time

## 2019-04-28 NOTE — Telephone Encounter (Signed)
[facility] pre-testing, unable to reach, message left to call office back to give Dr. Fabio Bering, recommendations  Called and spoke with patient, they are aware they are still to have both procedures.

## 2019-04-28 NOTE — Progress Notes (Signed)
Spoke with pt for pre-op call. Pt denies cardiac history or Diabetes. Pt is treated for HTN. Pt has a trach and a feeding tube. Pt instructed not to take any medications the morning of surgery due to the fact he flushes with 240 mls. Pt voiced understanding.  Pt had his Covid test done on 04/26/19 and it is negative. Pt states he's been in quarantine since the test was done and voices understanding that he needs to stay in quarantine until he comes to the hospital tomorrow.

## 2019-04-28 NOTE — Progress Notes (Signed)
Anesthesia Chart Review: Same day workup  Hx of Squamous cell carcinoma of the glottis status post tracheostomy and radiation to bilateral neck and glottis (tracheostomy 09/27/2018 - Per preop note 02/18/19, Trach Size and Type: Uncuffed;#6;With PMSV in place), and G-tube placement (10/07/2018). Paroxysmal SVT during hospital admission which was terminated with adenosine. Seen by cardiologist, Dr. Buford Dresser, during admission. Per note, "ECGs consistent with pSVT, likely rate related aberrancy as well. TSH normal, monitoring K/Mg. On metoprolol suspension 25 mg QID. Would continue, there is not a suspension of long acting succinate that I know of. Reviewed telemetry and ECGs. No clear fib/flutter that would require anticoagulation. Appears to beatrial tachycardia given clear on/offset. Llikely exacerbated by infection."  Echo 7/9 showed normal LV EF, normal valves. Small pericardial effusion localized anterior to RV, no echo evidence of tamponade.  Trach followed by Dr.Teoh.  Will need DOS labs and eval.  Intubation note from recent TURP 02/24/19: Induction Type: IV induction Tube type: Oral Tube size: 6.0 mm Number of attempts: 1 Placement Confirmation: positive ETCO2 and breath sounds checked- equal and bilateral Secured at: 14 (14cm at skin) cm Tube secured with: Tape Dental Injury: Teeth and Oropharynx as per pre-operative assessment  Comments: Uncuffed 6.0 trach exchanged for 6.0 ETT inserted through stoma. + ETCO2, and BBS.   EKG 10/13/18: Sinus tachycardia with 1st degree A-V block with occasional and consecutive Premature ventricular complexes. Rate 105. Septal infarct , age undetermined  Echo 10/03/2018 IMPRESSIONS  1. The left ventricle has normal systolic function with an ejection fraction of 60-65%. The cavity size was normal. Left ventricular diastolic parameters were normal. 2. The right ventricle has normal systolic function. The cavity was normal. There is no  increase in right ventricular wall thickness. Right ventricular systolic pressure could not be assessed. 3. Small pericardial effusion. 4. The pericardial effusion is anterior to the right ventricle. 5. The mitral valve is grossly normal. No evidence of mitral valve stenosis. 6. The aortic valve is tricuspid. No stenosis of the aortic valve. 7. The aortic root and descending aorta are normal in size and structure.  Lexiscan Myoview 01/08/2017  No diagnostic ST segment changes to indicate ischemia. GXT was converted to Lexiscan due to fatigue and inadequate heart rate response.  Blood pressure demonstrated a hypertensive response to exercise.  Small, moderate intensity, mid to apical inferior defect that is predominantly fixed with partial reversibility noted towards the apex. Suggestive of scar with mild peri-infarct ischemia, although normal wall motion on gated imaging.  This is a low risk study.  Nuclear stress EF: 58%.   Wynonia Musty [facility]/Anesthesiology Phone [phone] 04/28/2019 10:29 AM

## 2019-04-29 ENCOUNTER — Other Ambulatory Visit: Payer: Self-pay

## 2019-04-29 ENCOUNTER — Encounter (HOSPITAL_COMMUNITY): Payer: Self-pay | Admitting: Pulmonary Disease

## 2019-04-29 ENCOUNTER — Ambulatory Visit (HOSPITAL_COMMUNITY): Payer: Medicare Other | Admitting: Physician Assistant

## 2019-04-29 ENCOUNTER — Ambulatory Visit (HOSPITAL_COMMUNITY): Payer: Medicare Other

## 2019-04-29 ENCOUNTER — Other Ambulatory Visit: Payer: Self-pay | Admitting: Radiology

## 2019-04-29 ENCOUNTER — Ambulatory Visit (HOSPITAL_COMMUNITY)
Admission: RE | Admit: 2019-04-29 | Discharge: 2019-04-29 | Disposition: A | Discharge status: Home / Self Care | Payer: Medicare Other | Attending: Pulmonary Disease | Admitting: Pulmonary Disease

## 2019-04-29 ENCOUNTER — Encounter (HOSPITAL_COMMUNITY)
Admission: RE | Disposition: A | Discharge status: Home / Self Care | Payer: Self-pay | Source: Home / Self Care | Attending: Pulmonary Disease

## 2019-04-29 DIAGNOSIS — R911 Solitary pulmonary nodule: Secondary | ICD-10-CM | POA: Diagnosis present

## 2019-04-29 DIAGNOSIS — Z809 Family history of malignant neoplasm, unspecified: Secondary | ICD-10-CM | POA: Diagnosis not present

## 2019-04-29 DIAGNOSIS — R918 Other nonspecific abnormal finding of lung field: Secondary | ICD-10-CM | POA: Diagnosis not present

## 2019-04-29 DIAGNOSIS — J984 Other disorders of lung: Secondary | ICD-10-CM | POA: Diagnosis not present

## 2019-04-29 DIAGNOSIS — Z8521 Personal history of malignant neoplasm of larynx: Secondary | ICD-10-CM | POA: Insufficient documentation

## 2019-04-29 DIAGNOSIS — Z9889 Other specified postprocedural states: Secondary | ICD-10-CM

## 2019-04-29 DIAGNOSIS — I472 Ventricular tachycardia: Secondary | ICD-10-CM | POA: Diagnosis not present

## 2019-04-29 DIAGNOSIS — E785 Hyperlipidemia, unspecified: Secondary | ICD-10-CM | POA: Diagnosis not present

## 2019-04-29 DIAGNOSIS — Z8249 Family history of ischemic heart disease and other diseases of the circulatory system: Secondary | ICD-10-CM | POA: Insufficient documentation

## 2019-04-29 DIAGNOSIS — I10 Essential (primary) hypertension: Secondary | ICD-10-CM | POA: Diagnosis not present

## 2019-04-29 DIAGNOSIS — Z923 Personal history of irradiation: Secondary | ICD-10-CM | POA: Insufficient documentation

## 2019-04-29 DIAGNOSIS — R846 Abnormal cytological findings in specimens from respiratory organs and thorax: Secondary | ICD-10-CM | POA: Diagnosis not present

## 2019-04-29 DIAGNOSIS — D649 Anemia, unspecified: Secondary | ICD-10-CM | POA: Insufficient documentation

## 2019-04-29 DIAGNOSIS — I313 Pericardial effusion (noninflammatory): Secondary | ICD-10-CM | POA: Diagnosis not present

## 2019-04-29 DIAGNOSIS — D491 Neoplasm of unspecified behavior of respiratory system: Secondary | ICD-10-CM | POA: Insufficient documentation

## 2019-04-29 DIAGNOSIS — I4891 Unspecified atrial fibrillation: Secondary | ICD-10-CM | POA: Insufficient documentation

## 2019-04-29 DIAGNOSIS — Z86010 Personal history of colonic polyps: Secondary | ICD-10-CM | POA: Insufficient documentation

## 2019-04-29 DIAGNOSIS — K219 Gastro-esophageal reflux disease without esophagitis: Secondary | ICD-10-CM | POA: Diagnosis not present

## 2019-04-29 DIAGNOSIS — Z87891 Personal history of nicotine dependence: Secondary | ICD-10-CM | POA: Insufficient documentation

## 2019-04-29 HISTORY — PX: BRONCHIAL WASHINGS: SHX5105

## 2019-04-29 HISTORY — PX: BRONCHIAL BRUSHINGS: SHX5108

## 2019-04-29 HISTORY — PX: VIDEO BRONCHOSCOPY WITH ENDOBRONCHIAL NAVIGATION: SHX6175

## 2019-04-29 HISTORY — PX: ELECTROMAGNETIC NAVIGATION BROCHOSCOPY: SHX5369

## 2019-04-29 HISTORY — PX: BRONCHIAL NEEDLE ASPIRATION BIOPSY: SHX5106

## 2019-04-29 LAB — CBC
HCT: 38.5 % — ABNORMAL LOW (ref 39.0–52.0)
Hemoglobin: 11.9 g/dL — ABNORMAL LOW (ref 13.0–17.0)
MCH: 28.1 pg (ref 26.0–34.0)
MCHC: 30.9 g/dL (ref 30.0–36.0)
MCV: 90.8 fL (ref 80.0–100.0)
Platelets: 214 10*3/uL (ref 150–400)
RBC: 4.24 MIL/uL (ref 4.22–5.81)
RDW: 14.2 % (ref 11.5–15.5)
WBC: 5 10*3/uL (ref 4.0–10.5)
nRBC: 0 % (ref 0.0–0.2)

## 2019-04-29 LAB — COMPREHENSIVE METABOLIC PANEL
ALT: 19 U/L (ref 0–44)
AST: 22 U/L (ref 15–41)
Albumin: 3.4 g/dL — ABNORMAL LOW (ref 3.5–5.0)
Alkaline Phosphatase: 45 U/L (ref 38–126)
Anion gap: 11 (ref 5–15)
BUN: 25 mg/dL — ABNORMAL HIGH (ref 8–23)
CO2: 28 mmol/L (ref 22–32)
Calcium: 9.3 mg/dL (ref 8.9–10.3)
Chloride: 103 mmol/L (ref 98–111)
Creatinine, Ser: 0.98 mg/dL (ref 0.61–1.24)
GFR calc Af Amer: >60 mL/min (ref >60)
GFR calc non Af Amer: >60 mL/min (ref >60)
Glucose, Bld: 94 mg/dL (ref 70–99)
Potassium: 4.1 mmol/L (ref 3.5–5.1)
Sodium: 142 mmol/L (ref 135–145)
Total Bilirubin: 0.5 mg/dL (ref 0.3–1.2)
Total Protein: 6.6 g/dL (ref 6.5–8.1)

## 2019-04-29 LAB — PROTIME-INR
INR: 1.1 (ref 0.8–1.2)
Prothrombin Time: 13.7 seconds (ref 11.4–15.2)

## 2019-04-29 LAB — APTT: aPTT: 32 seconds (ref 24–36)

## 2019-04-29 SURGERY — VIDEO BRONCHOSCOPY WITH ENDOBRONCHIAL NAVIGATION
Anesthesia: General

## 2019-04-29 MED ORDER — ONDANSETRON HCL 4 MG/2ML IJ SOLN
INTRAMUSCULAR | Status: DC | PRN
  Administered 2019-04-29: 4 mg via INTRAVENOUS

## 2019-04-29 MED ORDER — LIDOCAINE 2% (20 MG/ML) 5 ML SYRINGE
INTRAMUSCULAR | Status: DC | PRN
  Administered 2019-04-29: 100 mg via INTRAVENOUS

## 2019-04-29 MED ORDER — ROCURONIUM BROMIDE 10 MG/ML (PF) SYRINGE
PREFILLED_SYRINGE | INTRAVENOUS | Status: DC | PRN
  Administered 2019-04-29: 30 mg via INTRAVENOUS

## 2019-04-29 MED ORDER — PROPOFOL 10 MG/ML IV BOLUS
INTRAVENOUS | Status: DC | PRN
  Administered 2019-04-29: 70 mg via INTRAVENOUS
  Administered 2019-04-29: 30 mg via INTRAVENOUS

## 2019-04-29 MED ORDER — SODIUM CHLORIDE 0.9 % IV SOLN: INTRAVENOUS | Status: DC

## 2019-04-29 MED ORDER — LACTATED RINGERS IV SOLN: INTRAVENOUS | Status: DC

## 2019-04-29 MED ORDER — DEXAMETHASONE SODIUM PHOSPHATE 10 MG/ML IJ SOLN
INTRAMUSCULAR | Status: DC | PRN
  Administered 2019-04-29: 5 mg via INTRAVENOUS

## 2019-04-29 MED ORDER — SUGAMMADEX SODIUM 200 MG/2ML IV SOLN
INTRAVENOUS | Status: DC | PRN
  Administered 2019-04-29: 200 mg via INTRAVENOUS

## 2019-04-29 MED ORDER — SUCCINYLCHOLINE CHLORIDE 200 MG/10ML IV SOSY
PREFILLED_SYRINGE | INTRAVENOUS | Status: DC | PRN
  Administered 2019-04-29: 60 mg via INTRAVENOUS

## 2019-04-29 MED ORDER — PHENYLEPHRINE 40 MCG/ML (10ML) SYRINGE FOR IV PUSH (FOR BLOOD PRESSURE SUPPORT)
PREFILLED_SYRINGE | INTRAVENOUS | Status: DC | PRN
  Administered 2019-04-29 (×3): 120 ug via INTRAVENOUS

## 2019-04-29 MED ORDER — PHENYLEPHRINE HCL-NACL 10-0.9 MG/250ML-% IV SOLN
INTRAVENOUS | Status: DC | PRN
  Administered 2019-04-29: 30 ug/min via INTRAVENOUS

## 2019-04-29 SURGICAL SUPPLY — 45 items
ADAPTER BRONCH F/PENTAX (ADAPTER) ×4 IMPLANT
ADAPTER VALVE BIOPSY EBUS (MISCELLANEOUS) IMPLANT
ADPTR VALVE BIOPSY EBUS (MISCELLANEOUS)
BRUSH CYTOL CELLEBRITY 1.5X140 (MISCELLANEOUS) ×4 IMPLANT
BRUSH SUPERTRAX BIOPSY (INSTRUMENTS) IMPLANT
BRUSH SUPERTRAX NDL-TIP CYTO (INSTRUMENTS) ×4 IMPLANT
CANISTER SUCT 3000ML PPV (MISCELLANEOUS) ×4 IMPLANT
CHANNEL WORK EXTEND EDGE 180 (KITS) IMPLANT
CHANNEL WORK EXTEND EDGE 45 (KITS) IMPLANT
CHANNEL WORK EXTEND EDGE 90 (KITS) IMPLANT
CONT SPEC 4OZ CLIKSEAL STRL BL (MISCELLANEOUS) ×4 IMPLANT
COVER BACK TABLE 60X90IN (DRAPES) ×4 IMPLANT
FILTER STRAW FLUID ASPIR (MISCELLANEOUS) IMPLANT
FORCEPS BIOP SUPERTRX PREMAR (INSTRUMENTS) ×4 IMPLANT
GAUZE SPONGE 4X4 12PLY STRL (GAUZE/BANDAGES/DRESSINGS) ×4 IMPLANT
GLOVE SURG SS PI 7.5 STRL IVOR (GLOVE) ×8 IMPLANT
GOWN STRL REUS W/ TWL LRG LVL3 (GOWN DISPOSABLE) ×4 IMPLANT
GOWN STRL REUS W/TWL LRG LVL3 (GOWN DISPOSABLE) ×4
KIT CLEAN ENDO COMPLIANCE (KITS) ×4 IMPLANT
KIT LOCATABLE GUIDE (CANNULA) IMPLANT
KIT MARKER FIDUCIAL DELIVERY (KITS) IMPLANT
KIT PROCEDURE EDGE 180 (KITS) IMPLANT
KIT PROCEDURE EDGE 45 (KITS) IMPLANT
KIT PROCEDURE EDGE 90 (KITS) IMPLANT
KIT TURNOVER KIT B (KITS) ×4 IMPLANT
MARKER SKIN DUAL TIP RULER LAB (MISCELLANEOUS) ×4 IMPLANT
NEEDLE SUPERTRX PREMARK BIOPSY (NEEDLE) ×4 IMPLANT
NS IRRIG 1000ML POUR BTL (IV SOLUTION) ×4 IMPLANT
OIL SILICONE PENTAX (PARTS (SERVICE/REPAIRS)) ×4 IMPLANT
PAD ARMBOARD 7.5X6 YLW CONV (MISCELLANEOUS) ×8 IMPLANT
PATCHES PATIENT (LABEL) ×12 IMPLANT
SOL ANTI FOG 6CC (MISCELLANEOUS) ×2 IMPLANT
SOLUTION ANTI FOG 6CC (MISCELLANEOUS) ×2
SYR 20CC LL (SYRINGE) ×4 IMPLANT
SYR 20ML ECCENTRIC (SYRINGE) ×4 IMPLANT
SYR 50ML SLIP (SYRINGE) ×4 IMPLANT
TOWEL OR 17X24 6PK STRL BLUE (TOWEL DISPOSABLE) ×4 IMPLANT
TRAP SPECIMEN MUCOUS 40CC (MISCELLANEOUS) IMPLANT
TUBE CONNECTING 20'X1/4 (TUBING) ×1
TUBE CONNECTING 20X1/4 (TUBING) ×3 IMPLANT
UNDERPAD 30X30 (UNDERPADS AND DIAPERS) ×4 IMPLANT
VALVE BIOPSY  SINGLE USE (MISCELLANEOUS) ×2
VALVE BIOPSY SINGLE USE (MISCELLANEOUS) ×2 IMPLANT
VALVE SUCTION BRONCHIO DISP (MISCELLANEOUS) ×4 IMPLANT
WATER STERILE IRR 1000ML POUR (IV SOLUTION) ×4 IMPLANT

## 2019-04-29 NOTE — Interval H&P Note (Signed)
History and Physical Interval Note:  04/29/2019 1:12 PM  Logan Price  has presented today for surgery, with the diagnosis of BILATERAL LUNG MASS.  The various methods of treatment have been discussed with the patient and family. After consideration of risks, benefits and other options for treatment, the patient has consented to  Procedure(s): [place] (N/A) as a surgical intervention.  The patient's history has been reviewed, patient examined, no change in status, stable for surgery.  I have reviewed the patient's chart and labs.  Questions were answered to the patient's satisfaction.    Patient seen in pre-op. All questions answered. No barriers to proceed.  Discussed risk, benefits, alternatives.  These risk include bleeding as well as pneumothorax.  [place]

## 2019-04-29 NOTE — Discharge Instructions (Signed)
Flexible Bronchoscopy, Care After This sheet gives you information about how to care for yourself after your test. Your doctor may also give you more specific instructions. If you have problems or questions, contact your doctor. Follow these instructions at home: Eating and drinking  The day after the test, go back to your normal diet. Driving  Do not drive for 24 hours if you were given a medicine to help you relax (sedative).  Do not drive or use heavy machinery while taking prescription pain medicine. General instructions   Take over-the-counter and prescription medicines only as told by your doctor.  Return to your normal activities as told. Ask what activities are safe for you.  Do not use any products that have nicotine or tobacco in them. This includes cigarettes and e-cigarettes. If you need help quitting, ask your doctor.  Keep all follow-up visits as told by your doctor. This is important. It is very important if you had a tissue sample (biopsy) taken. Get help right away if:  You have shortness of breath that gets worse.  You get light-headed.  You feel like you are going to pass out (faint).  You have chest pain.  You cough up: ? More than a little blood. ? More blood than before. Summary  Do not eat or drink anything (not even water) for 2 hours after your test, or until your numbing medicine wears off.  Do not use cigarettes. Do not use e-cigarettes.  Get help right away if you have chest pain. This information is not intended to replace advice given to you by your health care provider. Make sure you discuss any questions you have with your health care provider. Document Revised: 02/23/2017 Document Reviewed: 03/31/2016 Elsevier Patient Education  2020 [employer].

## 2019-04-29 NOTE — Transfer of Care (Signed)
Immediate Anesthesia Transfer of Care Note  Patient: Logan Price  Procedure(s) Performed: VIDEO BRONCHOSCOPY WITH ENDOBRONCHIAL NAVIGATION (N/A )  Patient Location: PACU  Anesthesia Type:General  Level of Consciousness: drowsy and responds to stimulation  Airway & Oxygen Therapy: Patient Spontanous Breathing and Patient connected to face mask oxygen  Post-op Assessment: Report given to RN and Post -op Vital signs reviewed and stable  Post vital signs: Reviewed and stable  Last Vitals:  Vitals Value Taken Time  BP 123/72 04/29/19 1522  Temp    Pulse 88 04/29/19 1523  Resp 19 04/29/19 1523  SpO2 99 % 04/29/19 1523  Vitals shown include unvalidated device data.  Last Pain:  Vitals:   04/29/19 1045  TempSrc:   PainSc: 0-No pain      Patients Stated Pain Goal: 6 (33/61/22 [id number])  Complications: No apparent anesthesia complications

## 2019-04-29 NOTE — Op Note (Addendum)
Video Bronchoscopy with Electromagnetic Navigation Procedure Note  Date of Operation: 04/29/2019  Pre-op Diagnosis: Left upper lobe groundglass lingular lung nodule  Post-op Diagnosis: Left upper lobe groundglass lingular lung nodule  Surgeon: Garner Nash, DO   Assistants: None   Anesthesia: General endotracheal anesthesia  Operation: Flexible video fiberoptic bronchoscopy with electromagnetic navigation and biopsies.  Estimated Blood Loss: Minimal, less than 1 cc  Complications: None  Indications and History: Logan Price is a 74 y.o. 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  The risks, benefits, complications, treatment options and expected outcomes were discussed with the patient.  The possibilities of pneumothorax, pneumonia, reaction to medication, pulmonary aspiration, perforation of a viscus, bleeding, failure to diagnose a condition and creating a complication requiring transfusion or operation were discussed with the patient who freely signed the consent.    Description of Procedure: The patient was seen in the Preoperative Area, was examined and was deemed appropriate to proceed.  The patient was taken to [facility] endoscopy room 2, identified as Logan Price and the procedure verified as Flexible Video Fiberoptic Bronchoscopy.  A Time Out was held and the above information confirmed.   Prior to the date of the procedure a high-resolution CT scan of the chest was performed. Utilizing [place] a virtual tracheobronchial tree was generated to allow the creation of distinct navigation pathways to the patient's parenchymal abnormalities. After being taken to the operating room general anesthesia was initiated and the patient  was orally intubated. The video fiberoptic bronchoscope was introduced via the endotracheal tube and a general inspection was performed which showed normal right and left lung anatomy  with no evidence of endobronchial disease.  There is distal pitting and striations consistent with prior tobacco abuse history. The extendable working channel and locator guide were introduced into the bronchoscope. The distinct navigation pathways prepared prior to this procedure were then utilized to navigate to within 0.9 mm of patient's lesion(s) identified on CT scan. The extendable working channel was secured into place and the locator guide was withdrawn. Under fluoroscopic guidance transbronchial needle brushings, transbronchial Wang needle biopsies, and transbronchial forceps biopsies were performed to be sent for cytology and pathology.  At the conclusion of tissue sampling we placed 3 gold fiducials using the Medtronic fiducial guide sheath within 3 axis planes of the lesion identified on CT.  A bronchioalveolar lavage was performed in the left upper lobe lingula and sent for cytology and microbiology (bacterial, fungal, AFB smears and cultures). At the end of the procedure a general airway inspection was performed and there was no evidence of active bleeding. The bronchoscope was removed.  The patient tolerated the procedure well.  At the conclusion of the procedure the bronchoscope was used for therapeutic suctioning of secretions blood clots from all distal airways.  The bronchoscope was brought to just above the main carina and there was no evidence of active bleeding.  There was no significant blood loss and there were no obvious complications. A post-procedural chest x-ray is pending.  Samples: 1. Transbronchial needle brushings from left upper lobe lingula 2. Transbronchial Wang needle biopsies from left upper lobe lingula 3. Transbronchial forceps biopsies from the upper lobe lingula 4. Bronchoalveolar lavage from the upper lobe lingula  Plans:  The patient will be discharged from the PACU to home when recovered from anesthesia and after chest x-ray is reviewed. We will review the  cytology, pathology and microbiology results  with the patient when they become available. Outpatient followup will be with Garner Nash, DO and Dr. Isidore Moos, MD from [facility].   Preliminary pathology: Brushings with atypical cells concerning for malignancy  Garner Nash, DO [facility] 04/29/2019 3:14 PM

## 2019-04-29 NOTE — Anesthesia Preprocedure Evaluation (Addendum)
Anesthesia Evaluation  Patient identified by MRN, date of birth, ID band Patient awake    Reviewed: Allergy & Precautions, NPO status , Patient's Chart, lab work & pertinent test results, reviewed documented beta blocker date and time   History of Anesthesia Complications Negative for: history of anesthetic complications  Airway Mallampati: Trach  TM Distance: >3 FB Neck ROM: Full    Dental no notable dental hx. (+) Dental Advisory Given   Pulmonary former smoker,  Glottic ca s/p tracheostomy 09/27/18   Pulmonary exam normal        Cardiovascular hypertension, Pt. on home beta blockers and Pt. on medications Normal cardiovascular exam+ dysrhythmias Atrial Fibrillation and Supra Ventricular Tachycardia   Echo 10/03/2018: EF 60-65%, normal valves Nuclear stress 01/08/2017: low risk study    Neuro/Psych negative neurological ROS  negative psych ROS   GI/Hepatic Neg liver ROS, GERD  Medicated and Controlled,  Endo/Other  negative endocrine ROS  Renal/GU negative Renal ROS   BPH    Musculoskeletal negative musculoskeletal ROS (+)   Abdominal   Peds  Hematology  (+) anemia , Hgb 12.1   Anesthesia Other Findings Day of surgery medications reviewed with patient.  Reproductive/Obstetrics negative OB ROS                            Anesthesia Physical  Anesthesia Plan  ASA: III  Anesthesia Plan: General   Post-op Pain Management:    Induction: Inhalational  PONV Risk Score and Plan: 3 and Treatment may vary due to age or medical condition, Ondansetron, Dexamethasone and Midazolam  Airway Management Planned: Tracheostomy  Additional Equipment: None  Intra-op Plan:   Post-operative Plan:   Informed Consent: I have reviewed the patients History and Physical, chart, labs and discussed the procedure including the risks, benefits and alternatives for the proposed anesthesia with the patient  or authorized representative who has indicated his/her understanding and acceptance.     Dental advisory given  Plan Discussed with: CRNA  Anesthesia Plan Comments: (Note from Karoline Caldwell.  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 Paroxysmal SVT during hospital admission which was terminated with adenosine. Seen by cardiologist, Dr. Buford Dresser, during admission. Per note, "ECGs consistent with pSVT, likely rate related aberrancy as well. TSH normal, monitoring K/Mg. On metoprolol suspension 25 mg QID. Would continue, there is not a suspension of long acting succinate that I know of. Reviewed telemetry and ECGs. No clear fib/flutter that would require anticoagulation. Appears to beatrial tachycardia given clear on/offset. Llikely exacerbated by infection."  Echo 7/9 showed normal LV EF, normal valves. Small pericardial effusion localized anterior to RV, no echo evidence of tamponade.  Trach followed by Dr.Teoh.  Will need DOS labs and eval.  Intubation note from recent TURP 02/24/19: Induction Type: IV induction Tube type: Oral Tube size: 6.0 mm Number of attempts: 1 Placement Confirmation: positive ETCO2 and breath sounds checked- equal and bilateral Secured at: 14 (14cm at skin) cm Tube secured with: Tape Dental Injury: Teeth and Oropharynx as per pre-operative assessment  Comments: Uncuffed 6.0 trach exchanged for 6.0 ETT inserted through stoma. + ETCO2, and BBS.   EKG 10/13/18: Sinus tachycardia with 1st degree A-V block with occasional and consecutive Premature ventricular complexes. Rate 105. Septal infarct , age undetermined  Echo  10/03/2018 IMPRESSIONS  1. The left ventricle has normal systolic function with an ejection fraction of 60-65%. The cavity size  was normal. Left ventricular diastolic parameters were normal. 2. The right ventricle has normal systolic function. The cavity was normal. There is no increase in right ventricular wall thickness. Right ventricular systolic pressure could not be assessed. 3. Small pericardial effusion. 4. The pericardial effusion is anterior to the right ventricle. 5. The mitral valve is grossly normal. No evidence of mitral valve stenosis. 6. The aortic valve is tricuspid. No stenosis of the aortic valve. 7. The aortic root and descending aorta are normal in size and structure.  Lexiscan Myoview 01/08/2017 No diagnostic ST segment changes to indicate ischemia. GXT was converted to Lexiscan due to fatigue and inadequate heart rate response. Blood pressure demonstrated a hypertensive response to exercise. Small, moderate intensity, mid to apical inferior defect that is predominantly fixed with partial reversibility noted towards the apex. Suggestive of scar with mild peri-infarct ischemia, although normal wall motion on gated imaging. This is a low risk study. Nuclear stress EF: 58%.  )        Anesthesia Quick Evaluation

## 2019-04-29 NOTE — Anesthesia Procedure Notes (Signed)
Procedure Name: Intubation Date/Time: 04/29/2019 1:29 PM Performed by: Janace Litten, CRNA Pre-anesthesia Checklist: Patient identified, Emergency Drugs available, Suction available and Patient being monitored Patient Re-evaluated:Patient Re-evaluated prior to induction Oxygen Delivery Method: Circle System Utilized Preoxygenation: Pre-oxygenation with 100% oxygen Induction Type: IV induction Tube type: Oral Tube size: 8.0 mm Number of attempts: 1 Placement Confirmation: ETT inserted through vocal cords under direct vision,  positive ETCO2 and breath sounds checked- equal and bilateral Tube secured with: Tape Dental Injury: Teeth and Oropharynx as per pre-operative assessment  Comments: 6.0 cuffless trach removed from stomach and replaced with oral 8.0 ETT. +ETCO2, BBS=

## 2019-04-30 ENCOUNTER — Ambulatory Visit (HOSPITAL_COMMUNITY)
Admission: RE | Admit: 2019-04-30 | Discharge: 2019-04-30 | Disposition: A | Discharge status: Home / Self Care | Payer: Medicare Other | Source: Ambulatory Visit | Attending: Radiation Oncology | Admitting: Radiation Oncology

## 2019-04-30 ENCOUNTER — Other Ambulatory Visit: Payer: Self-pay

## 2019-04-30 ENCOUNTER — Other Ambulatory Visit: Payer: Self-pay | Admitting: Student

## 2019-04-30 ENCOUNTER — Ambulatory Visit (HOSPITAL_COMMUNITY)
Admission: RE | Admit: 2019-04-30 | Discharge: 2019-04-30 | Disposition: A | Discharge status: Home / Self Care | Payer: Medicare Other | Source: Ambulatory Visit | Attending: Interventional Radiology | Admitting: Interventional Radiology

## 2019-04-30 DIAGNOSIS — Z4682 Encounter for fitting and adjustment of non-vascular catheter: Secondary | ICD-10-CM | POA: Diagnosis not present

## 2019-04-30 DIAGNOSIS — I700 Atherosclerosis of aorta: Secondary | ICD-10-CM | POA: Diagnosis not present

## 2019-04-30 DIAGNOSIS — J95811 Postprocedural pneumothorax: Secondary | ICD-10-CM | POA: Diagnosis not present

## 2019-04-30 DIAGNOSIS — C320 Malignant neoplasm of glottis: Secondary | ICD-10-CM | POA: Diagnosis not present

## 2019-04-30 DIAGNOSIS — R918 Other nonspecific abnormal finding of lung field: Secondary | ICD-10-CM | POA: Diagnosis not present

## 2019-04-30 DIAGNOSIS — Z930 Tracheostomy status: Secondary | ICD-10-CM | POA: Diagnosis not present

## 2019-04-30 DIAGNOSIS — Z87891 Personal history of nicotine dependence: Secondary | ICD-10-CM | POA: Diagnosis not present

## 2019-04-30 DIAGNOSIS — R911 Solitary pulmonary nodule: Secondary | ICD-10-CM | POA: Insufficient documentation

## 2019-04-30 DIAGNOSIS — J439 Emphysema, unspecified: Secondary | ICD-10-CM | POA: Diagnosis not present

## 2019-04-30 DIAGNOSIS — I6529 Occlusion and stenosis of unspecified carotid artery: Secondary | ICD-10-CM | POA: Diagnosis not present

## 2019-04-30 DIAGNOSIS — E785 Hyperlipidemia, unspecified: Secondary | ICD-10-CM | POA: Insufficient documentation

## 2019-04-30 DIAGNOSIS — Z79899 Other long term (current) drug therapy: Secondary | ICD-10-CM | POA: Insufficient documentation

## 2019-04-30 DIAGNOSIS — C3411 Malignant neoplasm of upper lobe, right bronchus or lung: Secondary | ICD-10-CM | POA: Diagnosis not present

## 2019-04-30 DIAGNOSIS — K219 Gastro-esophageal reflux disease without esophagitis: Secondary | ICD-10-CM | POA: Insufficient documentation

## 2019-04-30 DIAGNOSIS — I2510 Atherosclerotic heart disease of native coronary artery without angina pectoris: Secondary | ICD-10-CM | POA: Insufficient documentation

## 2019-04-30 LAB — PROTIME-INR
INR: 1.1 (ref 0.8–1.2)
Prothrombin Time: 13.9 seconds (ref 11.4–15.2)

## 2019-04-30 LAB — CBC
HCT: 37.7 % — ABNORMAL LOW (ref 39.0–52.0)
Hemoglobin: 11.8 g/dL — ABNORMAL LOW (ref 13.0–17.0)
MCH: 28.3 pg (ref 26.0–34.0)
MCHC: 31.3 g/dL (ref 30.0–36.0)
MCV: 90.4 fL (ref 80.0–100.0)
Platelets: 212 10*3/uL (ref 150–400)
RBC: 4.17 MIL/uL — ABNORMAL LOW (ref 4.22–5.81)
RDW: 14.3 % (ref 11.5–15.5)
WBC: 10.1 10*3/uL (ref 4.0–10.5)
nRBC: 0 % (ref 0.0–0.2)

## 2019-04-30 MED ORDER — MIDAZOLAM HCL 5 MG/5ML IJ SOLN
INTRAMUSCULAR | Status: AC | PRN
  Administered 2019-04-30: 1 mg via INTRAVENOUS

## 2019-04-30 MED ORDER — MIDAZOLAM HCL 2 MG/2ML IJ SOLN
INTRAMUSCULAR | Status: AC
  Filled 2019-04-30: qty 2

## 2019-04-30 MED ORDER — SODIUM CHLORIDE 0.9 % IV SOLN: INTRAVENOUS | Status: DC

## 2019-04-30 MED ORDER — FENTANYL CITRATE (PF) 100 MCG/2ML IJ SOLN
INTRAMUSCULAR | Status: AC | PRN
  Administered 2019-04-30: 50 ug via INTRAVENOUS

## 2019-04-30 MED ORDER — FENTANYL CITRATE (PF) 100 MCG/2ML IJ SOLN
INTRAMUSCULAR | Status: AC
  Filled 2019-04-30: qty 2

## 2019-04-30 MED ORDER — LIDOCAINE HCL 1 % IJ SOLN
INTRAMUSCULAR | Status: AC
  Filled 2019-04-30: qty 20

## 2019-04-30 NOTE — Discharge Instructions (Addendum)
Lung Biopsy, Care After This sheet gives you information about how to care for yourself after your procedure. Your health care provider may also give you more specific instructions depending on the type of biopsy you had. If you have problems or questions, contact your health care provider. What can I expect after the procedure? After the procedure, it is common to have:  A cough.  A sore throat.  Pain where a needle, bronchoscope, or incision was used to collect a biopsy sample (biopsy site). Follow these instructions at home: Medicines  Take over-the-counter and prescription medicines only as told by your health care provider.  Do not drink alcohol if your health care provider tells you not to drink.  Ask your health care provider if the medicine prescribed to you: ? Requires you to avoid driving or using heavy machinery. ? Can cause constipation. You may need to take these actions to prevent or treat constipation:  Drink enough fluid to keep your urine pale yellow.  Take over-the-counter or prescription medicines.  Eat foods that are high in fiber, such as beans, whole grains, and fresh fruits and vegetables.  Limit foods that are high in fat and processed sugars, such as fried or sweet foods.  Do not drive for 24 hours if you were given a sedative. Biopsy site care   Follow instructions from your health care provider about how to take care of your biopsy site. Make sure you: ? Wash your hands with soap and water before and after you change your bandage (dressing). If soap and water are not available, use hand sanitizer. ? Change your dressing as told by your health care provider. ? Leave stitches (sutures), skin glue, or adhesive strips in place. These skin closures may need to stay in place for 2 weeks or longer. If adhesive strip edges start to loosen and curl up, you may trim the loose edges. Do not remove adhesive strips completely unless your health care provider tells  you to do that.  Do not take baths, swim, or use a hot tub until your health care provider approves. Ask your health care provider if you may take showers. You may only be allowed to take sponge baths.  Check your biopsy site every day for signs of infection. Check for: ? Redness, swelling, or more pain. ? Fluid or blood. ? Warmth. ? Pus or a bad smell. General instructions  Return to your normal activities as told by your health care provider. Ask your health care provider what activities are safe for you.  It is up to you to get the results of your procedure. Ask your health care provider, or the department that is doing the procedure, when your results will be ready.  Keep all follow-up visits as told by your health care provider. This is important. Contact a health care provider if:  You have a fever.  You have redness, swelling, or more pain around your biopsy site.  You have fluid or blood coming from your biopsy site.  Your biopsy site feels warm to the touch.  You have pus or a bad smell coming from your biopsy site.  You have pain that does not get better with medicine. Get help right away if:  You cough up blood.  You have trouble breathing.  You have chest pain.  You lose consciousness. Summary  After the procedure, it is common to have a sore throat and a cough.  Return to your normal activities as told by your  health care provider. Ask your health care provider what activities are safe for you.  Take over-the-counter and prescription medicines only as told by your health care provider.  Report any unusual symptoms to your health care provider. This information is not intended to replace advice given to you by your health care provider. Make sure you discuss any questions you have with your health care provider. Document Revised: 04/17/2018 Document Reviewed: 04/11/2016 Elsevier Patient Education  [place]. Moderate Conscious Sedation,  Adult Sedation is the use of medicines to promote relaxation and relieve discomfort and anxiety. Moderate conscious sedation is a type of sedation. Under moderate conscious sedation, you are less alert than normal, but you are still able to respond to instructions, touch, or both. Moderate conscious sedation is used during short medical and dental procedures. It is milder than deep sedation, which is a type of sedation under which you cannot be easily woken up. It is also milder than general anesthesia, which is the use of medicines to make you unconscious. Moderate conscious sedation allows you to return to your regular activities sooner. Tell a health care provider about:  Any allergies you have.  All medicines you are taking, including vitamins, herbs, eye drops, creams, and over-the-counter medicines.  Use of steroids (by mouth or creams).  Any problems you or family members have had with sedatives and anesthetic medicines.  Any blood disorders you have.  Any surgeries you have had.  Any medical conditions you have, such as sleep apnea.  Whether you are pregnant or may be pregnant.  Any use of cigarettes, alcohol, marijuana, or street drugs. What are the risks? Generally, this is a safe procedure. However, problems may occur, including:  Getting too much medicine (oversedation).  Nausea.  Allergic reaction to medicines.  Trouble breathing. If this happens, a breathing tube may be used to help with breathing. It will be removed when you are awake and breathing on your own.  Heart trouble.  Lung trouble. What happens before the procedure? Staying hydrated Follow instructions from your health care provider about hydration, which may include:  Up to 2 hours before the procedure - you may continue to drink clear liquids, such as water, clear fruit juice, black coffee, and plain tea. Eating and drinking restrictions Follow instructions from your health care provider about  eating and drinking, which may include:  8 hours before the procedure - stop eating heavy meals or foods such as meat, fried foods, or fatty foods.  6 hours before the procedure - stop eating light meals or foods, such as toast or cereal.  6 hours before the procedure - stop drinking milk or drinks that contain milk.  2 hours before the procedure - stop drinking clear liquids. Medicine Ask your health care provider about:  Changing or stopping your regular medicines. This is especially important if you are taking diabetes medicines or blood thinners.  Taking medicines such as aspirin and ibuprofen. These medicines can thin your blood. Do not take these medicines before your procedure if your health care provider instructs you not to.  Tests and exams  You will have a physical exam.  You may have blood tests done to show: ? How well your kidneys and liver are working. ? How well your blood can clot. General instructions  Plan to have someone take you home from the hospital or clinic.  If you will be going home right after the procedure, plan to have someone with you for 24 hours.  What happens during the procedure?  An IV tube will be inserted into one of your veins.  Medicine to help you relax (sedative) will be given through the IV tube.  The medical or dental procedure will be performed. What happens after the procedure?  Your blood pressure, heart rate, breathing rate, and blood oxygen level will be monitored often until the medicines you were given have worn off.  Do not drive for 24 hours. This information is not intended to replace advice given to you by your health care provider. Make sure you discuss any questions you have with your health care provider. Document Revised: 02/23/2017 Document Reviewed: 07/03/2015 Elsevier Patient Education  2020 [employer].

## 2019-04-30 NOTE — Procedures (Signed)
Interventional Radiology Procedure Note  Procedure: CT guided biopsy of lung nodule, RUL.  Mx 18 g core Complications: None Recommendations: - Bedrest until CXR cleared.  Minimize talking, coughing or otherwise straining.  - Follow up 1 hr CXR pending  - NPO until CXR cleared  Signed,  Corrie Mckusick, DO

## 2019-04-30 NOTE — H&P (Signed)
Chief Complaint: Patient was seen in consultation today for right anterior lung mass  Referring Physician(s): Eppie Gibson  Supervising Physician: Corrie Mckusick  Patient Status: [facility] - Out-pt  History of Present Illness: Logan Price is a 74 y.o. male with past medical history of GERD, HTN, metastatic head neck cancer requiring trach, G-tube placement who presents with new suspicious lung masses.   CT Chest 04/02/19: 1.5 cm nodule in the anterior right upper lobe, new, suspicious for pulmonary metastasis. 2.5 cm ground-glass nodule in the left upper lobe/lingula, more conspicuous than on the prior, although without definite solid component. Continued attention on follow-up is suggested to exclude low-grade adenocarcinoma.  PET 04/02/19: 1. No specific findings identified to suggest residual FDG avid tumor within the neck. 2. Solid nodule within the anterior right middle lobe is FDG avid and worrisome for either metastatic disease or primary bronchogenic carcinoma. 3. Persistent sub solid nodule within the lingula with mild FDG uptake, SUV max 3.8. Low-grade pulmonary adenocarcinoma cannot be excluded. 4. Mild increased uptake identified within bilateral hilar regions and the left AP window region. Cannot exclude nodal metastasis. 5. Aortic Atherosclerosis (ICD10-I70.0) and Emphysema (ICD10-J43.9). 6. Coronary artery calcifications  Patient underwent biopsy via bronchoscopy of his left lingular lung mass yesterday; pathology is pending.  He presents to radiology today for right anterior lung mass biopsy. Per discussion with Dr. Isidore Moos, both areas to be biopsied as they may represent to separate processes.   Patient presents to [facility] Radiology today for biopsy of the right lung mass. Case was reviewed and approved by Dr. Anselm Pancoast.  Patient states his procedure went well yesterday.  He understands that the biopsy process is different today and that a percutaneous approach is planned.    He is stable on trach collar.  Denies fever, chills, cough, shortness of breath, nausea, vomiting, abdominal pain.  He does not take blood thinners. He has been NPO.    Past Medical History:  Diagnosis Date  . Diverticulitis   . Dysrhythmia 09/2018   episode of SVT while in hospital  . 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 GERD (gastroesophageal reflux disease)   . 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 HTN (hypertension)   . Hyperlipidemia     Past Surgical History:  Procedure Laterality Date  . BIOPSY  05/04/2015   Procedure: BIOPSY;  Surgeon: Danie Binder, MD;  [facility];  Service: Endoscopy;;  bx's of ileocecal valve   . COLONOSCOPY WITH PROPOFOL N/A 05/04/2015   Dr. Oneida Alar: normal appearing ileum with prominent IC valve with tubular adenomas, moderate diverticulosis in sigmoid colon, ascending colon, and retum. Moderate sized internal hemorrhoids. Surveillance in 5 years  . ESOPHAGOGASTRODUODENOSCOPY (EGD) WITH PROPOFOL N/A 12/19/2016   Procedure: ESOPHAGOGASTRODUODENOSCOPY (EGD) WITH PROPOFOL;  Surgeon: Danie Binder, MD;  [facility];  Service: Endoscopy;  Laterality: N/A;  11:30am  . FLEXIBLE SIGMOIDOSCOPY N/A 12/10/2015   hemorrhoid banding X 3   . HEMORRHOID BANDING N/A 12/10/2015   Procedure: HEMORRHOID BANDING;  Surgeon: Danie Binder, MD;  [facility];  Service: Endoscopy;  Laterality: N/A;  1:30 PM  . INSERTION OF SUPRAPUBIC CATHETER N/A 02/24/2019   Procedure: INSERTION OF SUPRAPUBIC CATHETER;  Surgeon: Kathie Rhodes, MD;  Location: [facility];  Service: Urology;  Laterality: N/A;  . IR CM INJ ANY COLONIC TUBE W/FLUORO  10/30/2018  . IR GASTROSTOMY TUBE MOD SED  10/04/2018  . IR IMAGING GUIDED PORT INSERTION  10/04/2018  . IR REMOVAL TUN ACCESS W/ PORT W/O FL MOD SED  10/14/2018  . IR REPLACE G-TUBE SIMPLE WO FLUORO  11/03/2018  . LAPAROSCOPIC INSERTION GASTROSTOMY TUBE Left 10/07/2018   Procedure: LAPAROSCOPIC  GASTROSTOMY  TUBE;  Surgeon: Kinsinger, Arta Bruce, MD;  Location: [facility];  Service: General;  Laterality: Left;  Marland Kitchen MICROLARYNGOSCOPY N/A 09/27/2018   Procedure: MICRO DIRECT LARYNGOSCOPY WITH BIOPSY;  Surgeon: Leta Baptist, MD;  Location: [facility] OR;  Service: ENT;  Laterality: N/A;  . None to date     As of 04/14/15  . POLYPECTOMY  05/04/2015   Procedure: POLYPECTOMY;  Surgeon: Danie Binder, MD;  [facility];  Service: Endoscopy;;  descending colon polyp, ascending colon polyp  . SAVORY DILATION N/A 12/19/2016   Procedure: SAVORY DILATION;  Surgeon: Danie Binder, MD;  [facility];  Service: Endoscopy;  Laterality: N/A;  . TRACHEOSTOMY TUBE PLACEMENT N/A 09/27/2018   Procedure: AWAKE TRACHEOSTOMY;  Surgeon: Leta Baptist, MD;  Location: [place];  Service: ENT;  Laterality: N/A;  . TRANSURETHRAL RESECTION OF PROSTATE N/A 02/24/2019   Procedure: TRANSURETHRAL RESECTION OF THE PROSTATE (TURP);  Surgeon: Kathie Rhodes, MD;  Location: [facility];  Service: Urology;  Laterality: N/A;    Allergies: Patient has no known allergies.  Medications: Prior to Admission medications   Medication Sig Start Date End Date Taking? Authorizing Provider  Amino Acids-Protein Hydrolys (FEEDING SUPPLEMENT, PRO-STAT SUGAR FREE 64,) LIQD Place 30 mLs into feeding tube 2 (two) times daily. 10/09/18  Yes Leta Baptist, MD  doxazosin (CARDURA) 1 MG tablet Place 1 tablet (1 mg total) into feeding tube daily. Patient taking differently: Place 1 mg into feeding tube daily at 12 noon.  10/10/18  Yes Leta Baptist, MD  Nutritional Supplements (KATE FARMS PEPTIDE 1.5) LIQD 325 mLs by PEG Tube route continuous. Patient taking differently: 325 mLs by PEG Tube route 4 (four) times daily.  11/07/18  Yes Rai, Ripudeep K, MD  pantoprazole (PROTONIX) 40 MG tablet 1 po 30 mins prior to first meal Patient taking differently: Take 40 mg by mouth daily.  12/12/17  Yes Fields, Marga Melnick, MD  simvastatin (ZOCOR) 20 MG tablet Take 1 tablet (20 mg total) by mouth  daily at 6 PM. 04/14/19  Yes Hawks, Theador Hawthorne, FNP  Water For Irrigation, Sterile (FREE WATER) SOLN Place 200 mLs into feeding tube every 4 (four) hours. 11/07/18  Yes Rai, Vernelle Emerald, MD     Family History  Problem Relation Age of Onset  . Cancer Mother   . Hypertension Father   . Hypertension Sister   . Hypertension Brother   . Hypertension Sister   . Aneurysm Brother 23       brain  . Colon cancer Neg Hx   . Colon polyps Neg Hx     Social History   Socioeconomic History  . Marital status: Married    Spouse name: Not on file  . Number of children: Not on file  . Years of education: GED  . Highest education level: GED or equivalent  Occupational History  . Occupation: Retired    Comment: [employer]  Tobacco Use  . Smoking status: Former Smoker    Packs/day: 1.00    Years: 40.00    Pack years: 40.00    Types: Cigarettes    Quit date: 10/09/2006    Years since quitting: 12.5  . Smokeless tobacco: Never Used  Substance and Sexual Activity  . Alcohol  use: Not Currently    Alcohol/week: 0.0 standard drinks    Comment: he denies 01/06/19  . Drug use: No  . Sexual activity: Yes    Birth control/protection: None  Other Topics Concern  . Not on file  Social History Narrative  . Not on file   Social Determinants of Health   Financial Resource Strain:   . Difficulty of Paying Living Expenses: Not on file  Food Insecurity:   . Worried About Charity fundraiser in the Last Year: Not on file  . Ran Out of Food in the Last Year: Not on file  Transportation Needs:   . Lack of Transportation (Medical): Not on file  . Lack of Transportation (Non-Medical): Not on file  Physical Activity:   . Days of Exercise per Week: Not on file  . Minutes of Exercise per Session: Not on file  Stress:   . Feeling of Stress : Not on file  Social Connections:   . Frequency of Communication with Friends and Family: Not on file  . Frequency of Social Gatherings with  Friends and Family: Not on file  . Attends Religious Services: Not on file  . Active Member of Clubs or Organizations: Not on file  . Attends Archivist Meetings: Not on file  . Marital Status: Not on file     Review of Systems: A 12 point ROS discussed and pertinent positives are indicated in the HPI above.  All other systems are negative.  Review of Systems  Constitutional: Negative for fatigue and fever.  Respiratory: Negative for cough and shortness of breath.   Cardiovascular: Negative for chest pain.  Gastrointestinal: Negative for abdominal pain, nausea and vomiting.  Genitourinary: Negative for dysuria.  Musculoskeletal: Negative for back pain.  Psychiatric/Behavioral: Negative for behavioral problems and confusion.    Vital Signs: BP 112/68   Pulse 87   Temp 97.9 F (36.6 C) (Oral)   Ht 5\' 9"  (1.753 m)   Wt 144 lb (65.3 kg)   SpO2 95%   BMI 21.27 kg/m   Physical Exam Vitals and nursing note reviewed.  Constitutional:      General: He is not in acute distress.    Appearance: He is not ill-appearing.  HENT:     Mouth/Throat:     Mouth: Mucous membranes are moist.     Pharynx: Oropharynx is clear.  Cardiovascular:     Rate and Rhythm: Normal rate and regular rhythm.  Pulmonary:     Effort: Pulmonary effort is normal. No respiratory distress.  Abdominal:     General: Abdomen is flat. There is no distension.     Palpations: Abdomen is soft.  Skin:    General: Skin is warm and dry.  Neurological:     General: No focal deficit present.     Mental Status: He is alert and oriented to person, place, and time. Mental status is at baseline.  Psychiatric:        Mood and Affect: Mood normal.        Behavior: Behavior normal.        Thought Content: Thought content normal.        Judgment: Judgment normal.      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 EXAM: CT NECK WITH  CONTRAST TECHNIQUE: Multidetector CT imaging of the neck was performed using the standard protocol following the bolus administration of intravenous contrast. CONTRAST:  25mL OMNIPAQUE IOHEXOL 300 MG/ML  SOLN COMPARISON:  09/28/2018 FINDINGS: Pharynx and larynx: Sequelae of interval radiation therapy are identified with diffuse low-density edema in the lower pharynx and supraglottic larynx. The airway remains widely patent. There is a small amount of residual asymmetric mildly enhancing soft tissue posterior to the supraglottic larynx on the right corresponding to an area of previous bulky tumor (for example series 2, image 63), however a well-defined residual enhancing mass is no longer present. There is mild retropharyngeal edema/trace fluid, and there are also postradiation changes throughout the subcutaneous fat and other neck soft tissues anteriorly. A tracheostomy tube remains in place. Salivary glands: Postradiation changes. Thyroid: Unremarkable. Lymph nodes: Decreased size of right level II lymph nodes, now with residual low-density soft tissue measuring 9 mm in short axis with a new punctate focus of calcification (series 2, image 46). Largely resolved right level III lymphadenopathy/extranodal tumor with a small amount of residual poorly defined soft tissue situated between the sternocleidomastoid muscle and internal jugular vein (series 2, image 64). Decreased size of subcentimeter left level IIB and III lymph nodes without enlarged or suspicious left-sided cervical lymph nodes currently evident. Vascular: Major vascular structures of the neck are patent. Aortic and carotid atherosclerosis without evidence of flow limiting stenosis. Limited intracranial: Unremarkable. Visualized orbits: Not imaged. Mastoids and visualized  paranasal sinuses: Trace left mastoid effusion. Visualized paranasal sinuses are clear. Skeleton: No suspicious osseous lesion. Mild cervical disc degeneration. Upper chest: Reported separately. Other: None. IMPRESSION: Positive treatment response with decreased size of primary tumor (possibly small amount of poorly defined residual tumor posterior to the larynx on the right) and largely resolved cervical lymphadenopathy. Electronically Signed   By: Logan Bores M.D.   On: 04/02/2019 11:48   CT Chest W Contrast  Result Date: 04/02/2019 CLINICAL DATA:  Glottic cancer EXAM: CT CHEST WITH CONTRAST TECHNIQUE: Multidetector CT imaging of the chest was performed during intravenous contrast administration. CONTRAST:  9mL OMNIPAQUE IOHEXOL 300 MG/ML  SOLN COMPARISON:  09/29/2018 FINDINGS: Cardiovascular: Heart is normal in size.  No pericardial effusion. No evidence of thoracic aortic aneurysm. Atherosclerotic calcifications of the aortic arch. Three vessel coronary atherosclerosis. Mediastinum/Nodes: No suspicious mediastinal lymphadenopathy. Visualized thyroid is unremarkable. Lungs/Pleura: Tracheostomy in satisfactory position. 13 x 15 mm solid nodule in the anterior/inferior right upper lobe (series 4/image 103), new, suspicious for metastasis. 17 x 25 mm ground-glass nodule in the left upper lobe/lingula (series 4/image 99), more conspicuous on the prior, although without definite solid component. Moderate centrilobular emphysematous changes, upper lung predominant. Biapical pleural-parenchymal scarring and/or radiation changes. Scarring/atelectasis in the lingula and medial right middle lobe. Calcified granulomata in the medial right lower lobe. No pleural effusion or pneumothorax. Upper Abdomen: Visualized upper abdomen is notable for a medial right upper pole renal cyst and a gastrostomy in satisfactory position. Musculoskeletal: Degenerative changes of the visualized thoracolumbar spine. IMPRESSION: 1.5 cm  nodule in the anterior right upper lobe, new, suspicious for pulmonary metastasis. 2.5 cm ground-glass nodule in the left upper lobe/lingula, more conspicuous than on the prior, although without definite solid component. Continued attention on follow-up is suggested to exclude low-grade adenocarcinoma. Additional ancillary findings as above. Aortic Atherosclerosis (ICD10-I70.0) and Emphysema (ICD10-J43.9). Electronically Signed   By: Julian Hy M.D.   On: 04/02/2019 12:44   NM PET Image Initial (PI) Skull Base To Thigh  Result Date:  04/02/2019 CLINICAL DATA:  Subsequent treatment strategy for laryngeal cancer. EXAM: NUCLEAR MEDICINE PET SKULL BASE TO THIGH TECHNIQUE: 6.5 mCi F-18 FDG was injected intravenously. Full-ring PET imaging was performed from the skull base to thigh after the radiotracer. CT data was obtained and used for attenuation correction and anatomic localization. Fasting blood glucose: 90 mg/dl COMPARISON:  04/02/2019 FINDINGS: Mediastinal blood pool activity: SUV max 2.6 Liver activity: SUV max NA NECK: Asymmetric increased tracer uptake within the larynx is identified. No focal nodular or masslike areas of increased uptake identified to suggest FDG avid residual tumor or FDG avid nodal metastases. Incidental CT findings: none CHEST: Bilateral FDG avid pulmonary nodules are identified. The solid subpleural nodule within the right middle lobe measures 1.6 cm and has an SUV max of 5.3. The sub solid nodule within the lingula measures 2.4 cm and has an SUV max of 3.0. Increased uptake within the left AP window region and bilateral hilar regions noted. The SUV max in the right hilum is equal to 4.11. The FDG uptake scratch set the SUV max within the AP window lymph node is equal to 4.31. SUV max within the left hilum is equal to 3.8. Incidental CT findings: Centrilobular emphysema. Aortic atherosclerosis. Three vessel coronary artery calcifications. ABDOMEN/PELVIS: No abnormal hypermetabolic  activity within the liver, pancreas, adrenal glands, or spleen. No hypermetabolic lymph nodes in the abdomen or pelvis. Incidental CT findings: Aortic atherosclerosis. No aneurysm. Right kidney cysts. Gastrostomy tube. Probable sebaceous cyst within the left lower quadrant ventral abdominal wall measuring 2 cm without increased FDG uptake. SKELETON: No focal hypermetabolic activity to suggest skeletal metastasis. Incidental CT findings: none IMPRESSION: 1. No specific findings identified to suggest residual FDG avid tumor within the neck. 2. Solid nodule within the anterior right middle lobe is FDG avid and worrisome for either metastatic disease or primary bronchogenic carcinoma. 3. Persistent sub solid nodule within the lingula with mild FDG uptake, SUV max 3.8. Low-grade pulmonary adenocarcinoma cannot be excluded. 4. Mild increased uptake identified within bilateral hilar regions and the left AP window region. Cannot exclude nodal metastasis. 5. Aortic Atherosclerosis (ICD10-I70.0) and Emphysema (ICD10-J43.9). 6. Coronary artery calcifications Electronically Signed   By: Kerby Moors M.D.   On: 04/02/2019 14:29   DG CHEST PORT 1 VIEW  Result Date: 04/29/2019 CLINICAL DATA:  74 year old male status post via bronchoscopy. Evaluate for pneumothorax. EXAM: PORTABLE CHEST 1 VIEW COMPARISON:  Chest radiograph dated 10/19/2018 and CT dated 04/02/2019. FINDINGS: Tracheostomy with tip approximately 7 cm above the carina. A 2 cm nodular density in the right lower lung field corresponds to the nodule seen on the prior CT. There is interstitial coarsening of the left mid to lower lung field which may corresponds to the ground-glass nodular area seen on the prior CT. No new consolidative changes. There is no pleural effusion or pneumothorax. The cardiac silhouette is within normal limits. Atherosclerotic calcification of the aorta. No acute osseous pathology. Multiple surgical clips overlie the left lung IMPRESSION: No  pneumothorax. Electronically Signed   By: Anner Crete M.D.   On: 04/29/2019 15:50   DG C-ARM BRONCHOSCOPY  Result Date: 04/29/2019 C-ARM BRONCHOSCOPY: Fluoroscopy was utilized by the requesting physician.  No radiographic interpretation.    Labs:  CBC: Recent Labs    03/08/19 2230 04/09/19 1238 04/29/19 1200 04/30/19 0935  WBC 6.1 5.5 5.0 10.1  HGB 12.5* 12.3* 11.9* 11.8*  HCT 39.9 37.3* 38.5* 37.7*  PLT 276 279 214 212    COAGS: Recent Labs  10/03/18 0400 10/14/18 0252 04/29/19 1151 04/30/19 0935  INR 1.2 1.3* 1.1 1.1  APTT  --   --  32  --     BMP: Recent Labs    03/08/19 2230 03/10/19 0458 04/09/19 1238 04/29/19 1151  NA 141 143 143 142  K 4.1 4.4 4.9 4.1  CL 102 109 103 103  CO2 29 24 29 28   GLUCOSE 105* 78 110* 94  BUN 27* 22 28* 25*  CALCIUM 9.3 8.7* 9.6 9.3  CREATININE 0.83 0.80 0.78 0.98  GFRNONAA >60 >60 90 >60  GFRAA >60 >60 104 >60    LIVER FUNCTION TESTS: Recent Labs    11/14/18 1236 02/18/19 1057 04/09/19 1238 04/29/19 1151  BILITOT 0.2 0.6 <0.2 0.5  AST 19 15 19 22   ALT 17 10 12 19   ALKPHOS 76 48 58 45  PROT 7.3 7.2 6.8 6.6  ALBUMIN 4.1 3.7 4.0 3.4*    TUMOR MARKERS: No results for input(s): AFPTM, CEA, CA199, CHROMGRNA in the last 8760 hours.  Assessment and Plan: Patient with past medical history of head and neck cancer s/p trach and G-tube placement presents with complaint of new lung masses on surveillance imaging.  IR consulted for lung mass biopsy at the request of Dr. Isidore Moos. Case reviewed by Dr. Anselm Pancoast who approves patient for procedure.  Patient presents today in their usual state of health.  He has been NPO and is not currently on blood thinners.   Risks and benefits of CT guided lung nodule biopsy was discussed with the patient including, but not limited to bleeding, hemoptysis, respiratory failure requiring intubation, infection, pneumothorax requiring chest tube placement, stroke from air embolism or even  death.  All of the patient's questions were answered and the patient is agreeable to proceed.  Consent signed and in chart.  Thank you for this interesting consult.  I greatly enjoyed meeting Logan Price and look forward to participating in their care.  A copy of this report was sent to the requesting provider on this date.  Electronically Signed: Docia Barrier, PA 04/30/2019, 10:43 AM   I spent a total of  30 Minutes   in face to face in clinical consultation, greater than 50% of which was counseling/coordinating care for right lung mass.

## 2019-05-01 DIAGNOSIS — Z430 Encounter for attention to tracheostomy: Secondary | ICD-10-CM | POA: Diagnosis not present

## 2019-05-01 DIAGNOSIS — R1310 Dysphagia, unspecified: Secondary | ICD-10-CM | POA: Diagnosis not present

## 2019-05-01 DIAGNOSIS — C328 Malignant neoplasm of overlapping sites of larynx: Secondary | ICD-10-CM | POA: Diagnosis not present

## 2019-05-01 DIAGNOSIS — Z431 Encounter for attention to gastrostomy: Secondary | ICD-10-CM | POA: Diagnosis not present

## 2019-05-01 DIAGNOSIS — K9423 Gastrostomy malfunction: Secondary | ICD-10-CM | POA: Diagnosis not present

## 2019-05-01 DIAGNOSIS — C320 Malignant neoplasm of glottis: Secondary | ICD-10-CM | POA: Diagnosis not present

## 2019-05-01 NOTE — Anesthesia Postprocedure Evaluation (Signed)
Anesthesia Post Note  Patient: Logan Price  Procedure(s) Performed: VIDEO BRONCHOSCOPY WITH ENDOBRONCHIAL NAVIGATION (N/A )     Patient location during evaluation: PACU Anesthesia Type: General Level of consciousness: sedated and patient cooperative Pain management: pain level controlled Vital Signs Assessment: post-procedure vital signs reviewed and stable Respiratory status: spontaneous breathing Cardiovascular status: stable Anesthetic complications: no    Last Vitals:  Vitals:   04/29/19 1607 04/29/19 1615  BP: 138/76   Pulse: 81   Resp: 12   Temp:  (!) 36.4 C  SpO2: 92%     Last Pain:  Vitals:   04/29/19 1552  TempSrc:   PainSc: 0-No pain                 Nolon Nations

## 2019-05-02 ENCOUNTER — Other Ambulatory Visit: Payer: Self-pay

## 2019-05-02 DIAGNOSIS — A498 Other bacterial infections of unspecified site: Secondary | ICD-10-CM

## 2019-05-02 LAB — CYTOLOGY - NON PAP

## 2019-05-02 LAB — SURGICAL PATHOLOGY

## 2019-05-02 LAB — CULTURE, RESPIRATORY W GRAM STAIN

## 2019-05-02 MED ORDER — LEVOFLOXACIN 500 MG PO TABS: 500.0000 mg | ORAL_TABLET | Freq: Every day | ORAL | 0 refills | Status: DC

## 2019-05-03 DIAGNOSIS — Z430 Encounter for attention to tracheostomy: Secondary | ICD-10-CM | POA: Diagnosis not present

## 2019-05-03 DIAGNOSIS — Z9981 Dependence on supplemental oxygen: Secondary | ICD-10-CM | POA: Diagnosis not present

## 2019-05-03 DIAGNOSIS — R1310 Dysphagia, unspecified: Secondary | ICD-10-CM | POA: Diagnosis not present

## 2019-05-03 DIAGNOSIS — K9423 Gastrostomy malfunction: Secondary | ICD-10-CM | POA: Diagnosis not present

## 2019-05-03 DIAGNOSIS — C328 Malignant neoplasm of overlapping sites of larynx: Secondary | ICD-10-CM | POA: Diagnosis not present

## 2019-05-03 DIAGNOSIS — C320 Malignant neoplasm of glottis: Secondary | ICD-10-CM | POA: Diagnosis not present

## 2019-05-03 DIAGNOSIS — Z431 Encounter for attention to gastrostomy: Secondary | ICD-10-CM | POA: Diagnosis not present

## 2019-05-03 DIAGNOSIS — Z79891 Long term (current) use of opiate analgesic: Secondary | ICD-10-CM | POA: Diagnosis not present

## 2019-05-06 DIAGNOSIS — Z431 Encounter for attention to gastrostomy: Secondary | ICD-10-CM | POA: Diagnosis not present

## 2019-05-06 DIAGNOSIS — R1310 Dysphagia, unspecified: Secondary | ICD-10-CM | POA: Diagnosis not present

## 2019-05-06 DIAGNOSIS — Z430 Encounter for attention to tracheostomy: Secondary | ICD-10-CM | POA: Diagnosis not present

## 2019-05-06 DIAGNOSIS — C328 Malignant neoplasm of overlapping sites of larynx: Secondary | ICD-10-CM | POA: Diagnosis not present

## 2019-05-06 DIAGNOSIS — K9423 Gastrostomy malfunction: Secondary | ICD-10-CM | POA: Diagnosis not present

## 2019-05-06 DIAGNOSIS — C320 Malignant neoplasm of glottis: Secondary | ICD-10-CM | POA: Diagnosis not present

## 2019-05-07 DIAGNOSIS — R1310 Dysphagia, unspecified: Secondary | ICD-10-CM | POA: Diagnosis not present

## 2019-05-07 DIAGNOSIS — K9423 Gastrostomy malfunction: Secondary | ICD-10-CM | POA: Diagnosis not present

## 2019-05-07 DIAGNOSIS — C320 Malignant neoplasm of glottis: Secondary | ICD-10-CM | POA: Diagnosis not present

## 2019-05-07 DIAGNOSIS — C328 Malignant neoplasm of overlapping sites of larynx: Secondary | ICD-10-CM | POA: Diagnosis not present

## 2019-05-07 DIAGNOSIS — Z431 Encounter for attention to gastrostomy: Secondary | ICD-10-CM | POA: Diagnosis not present

## 2019-05-07 DIAGNOSIS — Z430 Encounter for attention to tracheostomy: Secondary | ICD-10-CM | POA: Diagnosis not present

## 2019-05-08 ENCOUNTER — Other Ambulatory Visit: Payer: Self-pay | Admitting: *Deleted

## 2019-05-08 NOTE — Progress Notes (Signed)
The proposed treatment discussed in cancer conference 05/08/19 is for discussion purpose only and is not a binding recommendation.  The patient was not physically examined nor present for their treatment options.  Therefore, final treatment plans cannot be decided.  

## 2019-05-09 ENCOUNTER — Encounter: Payer: Self-pay | Admitting: *Deleted

## 2019-05-09 ENCOUNTER — Telehealth: Payer: Self-pay | Admitting: *Deleted

## 2019-05-09 DIAGNOSIS — C320 Malignant neoplasm of glottis: Secondary | ICD-10-CM

## 2019-05-09 NOTE — Progress Notes (Signed)
Oncology Nurse Navigator Documentation  Oncology Nurse Navigator Flowsheets 05/09/2019  Abnormal Finding Date -  Confirmed Diagnosis Date -  Diagnosis Status -  Planned Course of Treatment -  Phase of Treatment -  Radiation Actual Start Date: -  Radiation Expected End Date: -  Radiation Actual End Date: -  Navigation Complete Date: -  Navigator Location [facility]  Referral Date to RadOnc/MedOnc -  Navigator Encounter Type Other/I received a message from Dr. Isidore Moos to order PFT's and referral to thoracic surgery. I completed and called patient with an update.   Telephone -  Treatment Initiated Date -  Patient Visit Type -  Treatment Phase -  Barriers/Navigation Needs Coordination of Care;Education  Education Other  Interventions Coordination of Care;Education  Acuity Level 2-Minimal Needs (1-2 Barriers Identified)  Coordination of Care -  Time Spent with Patient 15

## 2019-05-12 ENCOUNTER — Ambulatory Visit (INDEPENDENT_AMBULATORY_CARE_PROVIDER_SITE_OTHER): Payer: Medicare Other | Admitting: Acute Care

## 2019-05-12 ENCOUNTER — Ambulatory Visit (INDEPENDENT_AMBULATORY_CARE_PROVIDER_SITE_OTHER): Payer: Medicare Other

## 2019-05-12 ENCOUNTER — Encounter: Payer: Self-pay | Admitting: Acute Care

## 2019-05-12 ENCOUNTER — Other Ambulatory Visit: Payer: Self-pay

## 2019-05-12 VITALS — BP 110/60 | HR 91 | Temp 97.1°F | Ht 69.0 in | Wt 145.4 lb

## 2019-05-12 DIAGNOSIS — C3491 Malignant neoplasm of unspecified part of right bronchus or lung: Secondary | ICD-10-CM | POA: Diagnosis not present

## 2019-05-12 DIAGNOSIS — C3492 Malignant neoplasm of unspecified part of left bronchus or lung: Secondary | ICD-10-CM | POA: Diagnosis not present

## 2019-05-12 DIAGNOSIS — R918 Other nonspecific abnormal finding of lung field: Secondary | ICD-10-CM | POA: Diagnosis not present

## 2019-05-12 DIAGNOSIS — Z0000 Encounter for general adult medical examination without abnormal findings: Secondary | ICD-10-CM

## 2019-05-12 NOTE — Progress Notes (Signed)
MEDICARE ANNUAL WELLNESS VISIT  05/12/2019  Telephone Visit Disclaimer This Medicare AWV was conducted by telephone due to national recommendations for restrictions regarding the COVID-19 Pandemic (e.g. social distancing).  I verified, using two identifiers, that I am speaking with Logan Price or their authorized healthcare agent. I discussed the limitations, risks, security, and privacy concerns of performing an evaluation and management service by telephone and the potential availability of an in-person appointment in the future. The patient expressed understanding and agreed to proceed.   Subjective:  Logan Price is a 74 y.o. male patient of Hawks, Theador Hawthorne, FNP who had a Medicare Annual Wellness Visit today via telephone. [name] is Retired and lives with their spouse. he has 9 children. he reports that he is socially active and does interact with friends/family regularly. he is minimally physically active and enjoys watching television.  Patient Care Team: Sharion Balloon, FNP as PCP - General (Nurse Practitioner) Danie Binder, MD as Consulting Physician (Gastroenterology) Eppie Gibson, MD as Attending Physician (Radiation Oncology) Leota Sauers, RN as Oncology Nurse Navigator Leta Baptist, MD as Consulting Physician (Otolaryngology)  Advanced Directives 04/30/2019 04/29/2019 03/09/2019 03/08/2019 02/24/2019 02/18/2019 01/06/2019  Does Patient Have a Medical Advance Directive? No No No No No No Yes  Type of Advance Directive - - - - - - Living will  Would patient like information on creating a medical advance directive? No - Patient declined No - Patient declined No - Patient declined - No - Patient declined - No - Patient declined    Hospital Utilization Over the Past 12 Months: # of hospitalizations or ER visits: 0 # of surgeries: 0  Review of Systems    Patient reports that his overall health is better compared to last year.    Patient Reported Readings (BP, Pulse,  CBG, Weight, etc) none  Pain Assessment Pain : No/denies pain Pain Score: 0-No pain     Current Medications & Allergies (verified) Allergies as of 05/12/2019   No Known Allergies     Medication List       Accurate as of May 12, 2019  2:32 PM. If you have any questions, ask your nurse or doctor.        doxazosin 1 MG tablet Commonly known as: CARDURA Place 1 tablet (1 mg total) into feeding tube daily. What changed: when to take this   feeding supplement (PRO-STAT SUGAR FREE 64) Liqd Place 30 mLs into feeding tube 2 (two) times daily.   free water Soln Place 200 mLs into feeding tube every 4 (four) hours.   Dillard [place] Peptide 1.5 Liqd 325 mLs by PEG Tube route continuous. What changed: when to take this   levofloxacin 500 MG tablet Commonly known as: LEVAQUIN Take 1 tablet (500 mg total) by mouth daily.   pantoprazole 40 MG tablet Commonly known as: Protonix 1 po 30 mins prior to first meal What changed:   how much to take  how to take this  when to take this  additional instructions   simvastatin 20 MG tablet Commonly known as: ZOCOR Take 1 tablet (20 mg total) by mouth daily at 6 PM.       History (reviewed): Past Medical History:  Diagnosis Date  . Diverticulitis   . Dysrhythmia 09/2018   episode of SVT while in hospital  . 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 GERD (gastroesophageal reflux disease)   . 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  HTN (hypertension)   . Hyperlipidemia    Past Surgical History:  Procedure Laterality Date  . BIOPSY  05/04/2015   Procedure: BIOPSY;  Surgeon: Danie Binder, MD;  [facility];  Service: Endoscopy;;  bx's of ileocecal valve   . BRONCHIAL BRUSHINGS  04/29/2019   Procedure: BRONCHIAL BRUSHINGS;  Surgeon: Garner Nash, DO;  Location: [place];  Service: Pulmonary;;  . BRONCHIAL NEEDLE ASPIRATION BIOPSY  04/29/2019   Procedure: BRONCHIAL NEEDLE ASPIRATION BIOPSIES;   Surgeon: Garner Nash, DO;  Location: [place];  Service: Pulmonary;;  . BRONCHIAL WASHINGS  04/29/2019   Procedure: BRONCHIAL WASHINGS;  Surgeon: Garner Nash, DO;  Location: [facility];  Service: Pulmonary;;  . COLONOSCOPY WITH PROPOFOL N/A 05/04/2015   Dr. Oneida Alar: normal appearing ileum with prominent IC valve with tubular adenomas, moderate diverticulosis in sigmoid colon, ascending colon, and retum. Moderate sized internal hemorrhoids. Surveillance in 5 years  . ELECTROMAGNETIC NAVIGATION BROCHOSCOPY  04/29/2019   Procedure: NAVIGATION BRONCHOSCOPY;  Surgeon: Garner Nash, DO;  Location: [facility];  Service: Pulmonary;;  . ESOPHAGOGASTRODUODENOSCOPY (EGD) WITH PROPOFOL N/A 12/19/2016   Procedure: ESOPHAGOGASTRODUODENOSCOPY (EGD) WITH PROPOFOL;  Surgeon: Danie Binder, MD;  [facility];  Service: Endoscopy;  Laterality: N/A;  11:30am  . FLEXIBLE SIGMOIDOSCOPY N/A 12/10/2015   hemorrhoid banding X 3   . HEMORRHOID BANDING N/A 12/10/2015   Procedure: HEMORRHOID BANDING;  Surgeon: Danie Binder, MD;  [facility];  Service: Endoscopy;  Laterality: N/A;  1:30 PM  . INSERTION OF SUPRAPUBIC CATHETER N/A 02/24/2019   Procedure: INSERTION OF SUPRAPUBIC CATHETER;  Surgeon: Kathie Rhodes, MD;  Location: [facility];  Service: Urology;  Laterality: N/A;  . IR CM INJ ANY COLONIC TUBE W/FLUORO  10/30/2018  . IR GASTROSTOMY TUBE MOD SED  10/04/2018  . IR IMAGING GUIDED PORT INSERTION  10/04/2018  . IR REMOVAL TUN ACCESS W/ PORT W/O FL MOD SED  10/14/2018  . IR REPLACE G-TUBE SIMPLE WO FLUORO  11/03/2018  . LAPAROSCOPIC INSERTION GASTROSTOMY TUBE Left 10/07/2018   Procedure: LAPAROSCOPIC  GASTROSTOMY TUBE;  Surgeon: Kinsinger, Arta Bruce, MD;  Location[place];  Service: General;  Laterality: Left;  Marland Kitchen MICROLARYNGOSCOPY N/A 09/27/2018   Procedure: MICRO DIRECT LARYNGOSCOPY WITH BIOPSY;  Surgeon: Leta Baptist, MD;  [facility];  Service: ENT;  Laterality: N/A;  . None to date     As  of 04/14/15  . POLYPECTOMY  05/04/2015   Procedure: POLYPECTOMY;  Surgeon: Danie Binder, MD;  [facility];  Service: Endoscopy;;  descending colon polyp, ascending colon polyp  . SAVORY DILATION N/A 12/19/2016   Procedure: SAVORY DILATION;  Surgeon: Danie Binder, MD;  [facility];  Service: Endoscopy;  Laterality: N/A;  . TRACHEOSTOMY TUBE PLACEMENT N/A 09/27/2018   Procedure: AWAKE TRACHEOSTOMY;  Surgeon: Leta Baptist, MD;  Location: [place];  Service: ENT;  Laterality: N/A;  . TRANSURETHRAL RESECTION OF PROSTATE N/A 02/24/2019   Procedure: TRANSURETHRAL RESECTION OF THE PROSTATE (TURP);  Surgeon: Kathie Rhodes, MD;  Location: [facility];  Service: Urology;  Laterality: N/A;  . VIDEO BRONCHOSCOPY WITH ENDOBRONCHIAL NAVIGATION N/A 04/29/2019   Procedure: VIDEO BRONCHOSCOPY;  Surgeon: Garner Nash, DO;  Location: [place];  Service: Pulmonary;  Laterality: N/A;   Family History  Problem Relation Age of Onset  . Cancer Mother   . Hypertension Father   . Hypertension Sister   . Hypertension Brother   . Hypertension Sister   .  Aneurysm Brother 75       brain  . Colon cancer Neg Hx   . Colon polyps Neg Hx    Social History   Socioeconomic History  . Marital status: Married    Spouse name: Not on file  . Number of children: 9  . Years of education: GED  . Highest education level: GED or equivalent  Occupational History  . Occupation: Retired    Comment: [employer]  Tobacco Use  . Smoking status: Former Smoker    Packs/day: 1.00    Years: 40.00    Pack years: 40.00    Types: Cigarettes    Quit date: 10/09/2006    Years since quitting: 12.5  . Smokeless tobacco: Never Used  Substance and Sexual Activity  . Alcohol use: Not Currently    Alcohol/week: 0.0 standard drinks    Comment: he denies 01/06/19  . Drug use: No  . Sexual activity: Yes    Partners: Female    Birth control/protection: None  Other Topics Concern  . Not on file    Social History Narrative   Lives with wife      9 children      GED   Social Determinants of Health   Financial Resource Strain:   . Difficulty of Paying Living Expenses: Not on file  Food Insecurity:   . Worried About Charity fundraiser in the Last Year: Not on file  . Ran Out of Food in the Last Year: Not on file  Transportation Needs:   . Lack of Transportation (Medical): Not on file  . Lack of Transportation (Non-Medical): Not on file  Physical Activity:   . Days of Exercise per Week: Not on file  . Minutes of Exercise per Session: Not on file  Stress:   . Feeling of Stress : Not on file  Social Connections:   . Frequency of Communication with Friends and Family: Not on file  . Frequency of Social Gatherings with Friends and Family: Not on file  . Attends Religious Services: Not on file  . Active Member of Clubs or Organizations: Not on file  . Attends Archivist Meetings: Not on file  . Marital Status: Not on file    Activities of Daily Living In your present state of health, do you have any difficulty performing the following activities: 05/12/2019 03/09/2019  Hearing? N N  Vision? Y N  Comment Wears glasses -  Difficulty concentrating or making decisions? N N  Walking or climbing stairs? N N  Dressing or bathing? N N  Doing errands, shopping? N N  Preparing Food and eating ? N -  Using the Toilet? N -  In the past six months, have you accidently leaked urine? N -  Do you have problems with loss of bowel control? N -  Managing your Medications? Y -  Comment Wifes helps crush pills to put in feeding tube -  Managing your Finances? N -  Housekeeping or managing your Housekeeping? N -  Some recent data might be hidden    Patient Education/ Literacy How often do you need to have someone help you when you read instructions, pamphlets, or other written materials from your doctor or pharmacy?: 1 - Never What is the last grade level you completed in  school?: GED  Exercise Current Exercise Habits: Home exercise routine, Type of exercise: walking, Time (Minutes): 60, Intensity: Mild, Exercise limited by: None identified  Diet Patient reports consuming 3  meals a day and 3 snack(s) a day Patient reports that his primary diet is: feeding tube supplements Patient reports that she does have regular access to food.   Depression Screen PHQ 2/9 Scores 05/12/2019 04/09/2019 11/14/2018 05/09/2018 04/25/2018 10/23/2017 04/30/2017  PHQ - 2 Score 0 0 0 0 0 0 0  PHQ- 9 Score - 0 - - - - -     Fall Risk Fall Risk  05/12/2019 04/09/2019 11/14/2018 05/09/2018 05/09/2018  Falls in the past year? 0 0 0 0 0     Objective:  Logan Price seemed alert and oriented and he participated appropriately during our telephone visit.  Blood Pressure Weight BMI  BP Readings from Last 3 Encounters:  05/12/19 110/60  04/30/19 126/62  04/29/19 138/76   Wt Readings from Last 3 Encounters:  05/12/19 145 lb 6.4 oz (66 kg)  04/30/19 144 lb (65.3 kg)  04/29/19 144 lb (65.3 kg)   BMI Readings from Last 1 Encounters:  05/12/19 21.47 kg/m    *Unable to obtain current vital signs, weight, and BMI due to telephone visit type  Hearing/Vision  . Camryn did not seem to have difficulty with hearing/understanding during the telephone conversation . Reports that he has had a formal eye exam by an eye care professional within the past year . Reports that he has not had a formal hearing evaluation within the past year *Unable to fully assess hearing and vision during telephone visit type  Cognitive Function: 6CIT Screen 05/12/2019  What Year? 0 points  What month? 0 points  What time? 0 points  Count back from 20 0 points  Months in reverse 0 points  Repeat phrase 0 points  Total Score 0   (Normal:0-7, Significant for Dysfunction: >8)  Normal Cognitive Function Screening: Yes    Immunization & Health Maintenance Record Immunization History  Administered Date(s)  Administered  . Hep A / Hep B 06/03/2015, 07/06/2015, 01/04/2016  . Pneumococcal Conjugate-13 09/03/2013  . Pneumococcal Polysaccharide-23 10/09/2014  . Tdap 03/04/2010    Health Maintenance  Topic Date Due  . Samul Dada  03/04/2020  . COLONOSCOPY  05/03/2025  . Hepatitis C Screening  Completed  . PNA vac Low Risk Adult  Completed  . INFLUENZA VACCINE  Discontinued       Assessment  This is a routine wellness examination for Logan Price.  Health Maintenance: Due or Overdue There are no preventive care reminders to display for this patient.  Logan Price does not need a referral for [employer] Assistance: Care Management:   no Social Work:    no Prescription Assistance:  no Nutrition/Diabetes Education:  no   Plan:  Personalized Goals Goals Addressed   None    Personalized Health Maintenance & Screening Recommendations  Packet will be mailed to patient's home address.  Lung Cancer Screening Recommended: no (Low Dose CT Chest recommended if Age 29-80 years, 30 pack-year currently smoking OR have quit w/in past 15 years) Hepatitis C Screening recommended: no HIV Screening recommended: no  Advanced Directives: Written information was prepared per patient's request.  Referrals & Orders No orders of the defined types were placed in this encounter.   Follow-up Plan . Follow-up with Sharion Balloon, FNP as planned . Scheduled 10/07/2019    I have personally reviewed and noted the following in the patient's chart:   . Medical and social history . Use of alcohol, tobacco or illicit drugs  . Current medications and supplements . Functional ability and status .  Nutritional status . Physical activity . Advanced directives . List of other physicians . Hospitalizations, surgeries, and ER visits in previous 12 months . Vitals . Screenings to include cognitive, depression, and falls . Referrals and appointments  In addition, I have reviewed and discussed with  Logan Price certain preventive protocols, quality metrics, and best practice recommendations. A written personalized care plan for preventive services as well as general preventive health recommendations is available and can be mailed to the patient at his request.      Alphonzo Dublin, LPN 04/30/5595

## 2019-05-12 NOTE — Progress Notes (Signed)
History of Present Illness Logan Price is a 74 y.o. male former smoker ( 40 pack year smoking history) with past medical history of GERD, HTN, metastatic head neck cancer requiring trach, G-tube placement who presented with new suspicious lung masses. He is followed by Dr. Valeta Harms  Synopsis: Logan Price is a 74 y.o. male with past medical history of GERD, HTN, metastatic head neck cancer requiring trach, G-tube placement who presented with new suspicious lung masses. He underwent biopsy via bronchoscopy on 2/2 and again on 2/3 of his left lingular lung mass ( 2/2)  , and also right anterior lung mass biopsy ( 2/3) Pathology has revealed that these the right upper lobe lesion is a new primary Squamous cell carcinoma of the lung, while the left lingular mass is positive for Focal atypical glands suspicious for adenocarcinoma . These appear to be separate processes. He is followed by Dr. Valeta Harms.  05/12/2019  History of Present Illness: Pt. Presents for follow up. He had biopsy via bronch 2/2, and 2/3 for 2 separate pulmonary lesions, one on the right, and one on the left. Marland Kitchen BAL send from the procedure revealed Logan Price . He states he was treated  with Doxycycline  x 7 days. He completed his treatment 05/11/2019. Dr. Valeta Harms actually ordered Levaquin for the patient. There is notation of an order on 05/02/2019.  It is possible that the patient is mistaken.  He states he does not feel any different after taking the medication.  He did not have any symptoms prior to taking the medication.  He states he has white secretions. He denies any fever,or shortness of breath. He feels he is back to his back to his baseline. He states he feels strong.   PFT's have been ordered to evaluate pulmonary function to see if the patient is a candidate for surgery.. He has not received a call for scheduling. There is some question of the feasibility of PFT's on a patient with a trach. With a cuffed trach and a cap  it may be possible. The flow loop volumes will look like a fixed  obstruction, but it may give [place] an idea of pulmonary function. I suspect plain spirometry would also be a possibility.  I explained that he will need COVID testing first. He understands his options for surgical treatment will be dependent on his PFT results, if we can indeed get them done with a trach in place.   His trach is intact. He has his PM valve in and has good phonation. Strong cough. He states his strength is good. He has TF from 8 am-8 pm daily via PEG tube. He is working with speech with the hope that he will eventually be able to resume PO nutrition.The plan for now is to address his lung cancers , and then work on de cannulating him.    Test Results: 04/29/2019 A. LUNG, LEFT UPPER LOBE, BIOPSY:  - Focal atypical glands suspicious for adenocarcinoma, see comment. There is a very small focus of atypical glands that while suspicious for  adenocarcinoma are not diagnostic.  04/30/2019 LUNG, RIGHT UPPER LOBE, NEEDLE CORE BIOPSY:  - Squamous cell carcinoma, see comment      CT Chest 04/02/19: 1.5 cm nodule in the anterior right upper lobe, new, suspicious for pulmonary metastasis. 2.5 cm ground-glass nodule in the left upper lobe/lingula, more conspicuous than on the prior, although without definite solid component. Continued attention on follow-up is suggested to exclude low-grade adenocarcinoma.  PET 04/02/19: 1. No specific findings identified to suggest residual FDG avid tumor within the neck. 2. Solid nodule within the anterior right middle lobe is FDG avid and worrisome for either metastatic disease or primary bronchogenic carcinoma. 3. Persistent sub solid nodule within the lingula with mild FDG uptake, SUV max 3.8. Low-grade pulmonary adenocarcinoma cannot be excluded. 4. Mild increased uptake identified within bilateral hilar regions and the left AP window region. Cannot exclude nodal metastasis. 5.  Aortic Atherosclerosis (ICD10-I70.0) and Emphysema (ICD10-J43.9). 6. Coronary artery calcifications  CBC Latest Ref Rng & Units 04/30/2019 04/29/2019 04/09/2019  WBC 4.0 - 10.5 K/uL 10.1 5.0 5.5  Hemoglobin 13.0 - 17.0 g/dL 11.8(L) 11.9(L) 12.3(L)  Hematocrit 39.0 - 52.0 % 37.7(L) 38.5(L) 37.3(L)  Platelets 150 - 400 K/uL 212 214 279    BMP Latest Ref Rng & Units 04/29/2019 04/09/2019 03/10/2019  Glucose 70 - 99 mg/dL 94 110(H) 78  BUN 8 - 23 mg/dL 25(H) 28(H) 22  Creatinine 0.61 - 1.24 mg/dL 0.98 0.78 0.80  BUN/Creat Ratio 10 - 24 - 36(H) -  Sodium 135 - 145 mmol/L 142 143 143  Potassium 3.5 - 5.1 mmol/L 4.1 4.9 4.4  Chloride 98 - 111 mmol/L 103 103 109  CO2 22 - 32 mmol/L _0 Calcium 8.9 - 10.3 mg/dL 9.3 9.6 8.7(L)    BNP No results found for: BNP  ProBNP No results found for: PROBNP  PFT No results found for: FEV1PRE, FEV1POST, FVCPRE, FVCPOST, TLC, DLCOUNC, PREFEV1FVCRT, PSTFEV1FVCRT  CT BIOPSY  Result Date: 04/30/2019 INDICATION: 74 year old male referred for right upper lobe FDG avid lung nodule biopsy EXAM: CT BIOPSY MEDICATIONS: None. ANESTHESIA/SEDATION: Moderate (conscious) sedation was employed during this procedure. A total of Versed 1.0 mg and Fentanyl 50 mcg was administered intravenously. Moderate Sedation Time: 15 minutes. The patient's level of consciousness and vital signs were monitored continuously by radiology nursing throughout the procedure under my direct supervision. FLUOROSCOPY TIME:  CT COMPLICATIONS: None PROCEDURE: The procedure, risks, benefits, and alternatives were explained to the patient and the patient's family. Specific risks that were addressed included bleeding, infection, pneumothorax, need for further procedure including chest tube placement, chance of delayed pneumothorax or hemorrhage, hemoptysis, nondiagnostic sample, cardiopulmonary collapse, death. Questions regarding the procedure were encouraged and answered. The patient understands and  consents to the procedure. Patient was positioned in the supine position on the CT gantry table and a scout CT of the chest was performed for planning purposes. Once angle of approach was determined, the skin and subcutaneous tissues this scan was prepped and draped in the usual sterile fashion, and a sterile drape was applied covering the operative field. A sterile gown and sterile gloves were used for the procedure. Local anesthesia was provided with 1% Lidocaine. The skin and subcutaneous tissues were infiltrated 1% lidocaine for local anesthesia, and a small stab incision was made with an 11 blade scalpel. Using CT guidance, a 17 gauge trocar needle was advanced into the right upper lobetarget. After confirmation of the tip, separate 18 gauge core biopsies were performed. These were placed into solution for transportation to the lab. Biosentry Device was deployed. A final CT image was performed. Patient tolerated the procedure well and remained hemodynamically stable throughout. No complications were encountered and no significant blood loss was encounter IMPRESSION: Status post CT-guided right lung nodule biopsy. Signed, Dulcy Fanny. Dellia Nims, RPVI [facility] Electronically Signed   By: Corrie Mckusick D.O.   On: 04/30/2019  16:21   DG Chest Port 1 View  Result Date: 04/30/2019 CLINICAL DATA:  Status post biopsy.  Evaluate for pneumothorax. EXAM: PORTABLE CHEST 1 VIEW COMPARISON:  04/29/2019 FINDINGS: Tracheostomy tube tip is above the carina. Heart size is normal. There is no pleural effusion or edema. No pneumothorax following lung biopsy. Right lower lobe lung lesion is again identified. IMPRESSION: 1. No pneumothorax status post lung biopsy. Electronically Signed   By: Kerby Moors M.D.   On: 04/30/2019 14:02   DG CHEST PORT 1 VIEW  Result Date: 04/29/2019 CLINICAL DATA:  74 year old male status post via bronchoscopy. Evaluate for pneumothorax.  EXAM: PORTABLE CHEST 1 VIEW COMPARISON:  Chest radiograph dated 10/19/2018 and CT dated 04/02/2019. FINDINGS: Tracheostomy with tip approximately 7 cm above the carina. A 2 cm nodular density in the right lower lung field corresponds to the nodule seen on the prior CT. There is interstitial coarsening of the left mid to lower lung field which may corresponds to the ground-glass nodular area seen on the prior CT. No new consolidative changes. There is no pleural effusion or pneumothorax. The cardiac silhouette is within normal limits. Atherosclerotic calcification of the aorta. No acute osseous pathology. Multiple surgical clips overlie the left lung IMPRESSION: No pneumothorax. Electronically Signed   By: Anner Crete M.D.   On: 04/29/2019 15:50   DG C-ARM BRONCHOSCOPY  Result Date: 04/29/2019 C-ARM BRONCHOSCOPY: Fluoroscopy was utilized by the requesting physician.  No radiographic interpretation.     Past medical hx Past Medical History:  Diagnosis Date  . Diverticulitis   . Dysrhythmia 09/2018   episode of SVT while in hospital  . 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 GERD (gastroesophageal reflux disease)   . 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 HTN (hypertension)   . Hyperlipidemia      Social History   Tobacco Use  . Smoking status: Former Smoker    Packs/day: 1.00    Years: 40.00    Pack years: 40.00    Types: Cigarettes    Quit date: 10/09/2006    Years since quitting: 12.5  . Smokeless tobacco: Never Used  Substance Use Topics  . Alcohol use: Not Currently    Alcohol/week: 0.0 standard drinks    Comment: he denies 01/06/19  . Drug use: No    Mr.Michele reports that he quit smoking about 12 years ago. His smoking use included cigarettes. He has a 40.00 pack-year smoking history. He has never used smokeless tobacco. He reports previous alcohol use. He reports that he does not use drugs.  Tobacco Cessation: Former smoker Quit 2008 with a 40 pack year  smoking history  Past surgical hx, Family hx, Social hx all reviewed.  Current Outpatient Medications on File Prior to Visit  Medication Sig  . Amino Acids-Protein Hydrolys (FEEDING SUPPLEMENT, PRO-STAT SUGAR FREE 64,) LIQD Place 30 mLs into feeding tube 2 (two) times daily.  Marland Kitchen doxazosin (CARDURA) 1 MG tablet Place 1 tablet (1 mg total) into feeding tube daily. (Patient taking differently: Place 1 mg into feeding tube daily at 12 noon. )  . levofloxacin (LEVAQUIN) 500 MG tablet Take 1 tablet (500 mg total) by mouth daily.  . Nutritional Supplements (KATE FARMS PEPTIDE 1.5) LIQD 325 mLs by PEG Tube route continuous. (Patient taking differently: 325 mLs by PEG Tube route 4 (four) times daily. )  . pantoprazole (PROTONIX) 40 MG tablet 1 po 30 mins prior to first meal (Patient taking differently: Take 40 mg  by mouth daily. )  . simvastatin (ZOCOR) 20 MG tablet Take 1 tablet (20 mg total) by mouth daily at 6 PM.  . Water For Irrigation, Sterile (FREE WATER) SOLN Place 200 mLs into feeding tube every 4 (four) hours.   No current facility-administered medications on file prior to visit.     No Known Allergies  Review Of Systems:  Constitutional:   No  weight loss, night sweats,  Fevers, chills, fatigue, or  lassitude.  HEENT:   No headaches,  Difficulty swallowing,  Tooth/dental problems, or  Sore throat,                No sneezing, itching, ear ache, nasal congestion, post nasal drip,   CV:  No chest pain,  Orthopnea, PND, swelling in lower extremities, anasarca, dizziness, palpitations, syncope.   GI  No heartburn, indigestion, abdominal pain, nausea, vomiting, diarrhea, change in bowel habits, loss of appetite, bloody stools.   Resp: No shortness of breath with exertion or at rest.  No excess mucus, no productive cough,  No non-productive cough,  No coughing up of blood.  No change in color of mucus.  No wheezing.  No chest wall deformity  Skin: no rash or lesions.  GU: no dysuria,  change in color of urine, no urgency or frequency.  No flank pain, no hematuria   MS:  No joint pain or swelling.  No decreased range of motion.  No back pain.  Psych:  No change in mood or affect. No depression or anxiety.  No memory loss.   Vital Signs BP 110/60 (BP Location: Left Arm, Cuff Size: Normal)   Pulse 91   Temp (!) 97.1 F (36.2 C) (Oral)   Ht _0  (1.753 m)   Wt 66 kg   SpO2 95%   BMI 21.47 kg/m    Physical Exam:  General- No distress,  A&Ox3, pleasant ENT: No sinus tenderness, TM clear, pale nasal mucosa, no oral exudate,no post nasal drip, no LAN, trach with PM valve, good phonation, strong cough Cardiac: S1, S2, regular rate and rhythm, no murmur Chest: No wheeze/ rales/ dullness; no accessory muscle use, no nasal flaring, no sternal retractions Abd.: Soft Non-tender, ND, BS +, PEG tube, site is clean dry and intact Ext: No clubbing cyanosis, edema Neuro:  MAE x 4, A&O x 3, deconditioned.  Skin: No rashes, No lesions, warm and dry Psych: normal mood and behavior   Assessment/Plan  Pseudomonis per BAL  Plan Treated with Levaquin 05/02/2019 per Dr. Valeta Harms. Clinically asymptomatic  Bilateral Lung Masses Appear to be 2 separate Primary Lung Cancers Left Lung suspicious for adenocarcinoma Right lung positive for Squamous cell Plan Presented to [facility] 05/08/2019 Will check feasibility of getting PFT's or Spirometry ( ? If able  with trach) to determine if patient is a surgical candidate ( Ordered by Dr. Isidore Moos) Has an appointment with Dr. Roxan Hockey 2/16 to evaluate if he is a surgical candidate. Follow up with Dr. Valeta Harms 05/21/2019 as is scheduled Will need referral to radiation oncology , and oncology  In conjunction with surgery once plan is determined.  This appointment was over 40  minutes long with over 50% of the time being direct face to face patient care, assessment , plan of care , and follow up,     Magdalen Spatz, NP 05/12/2019  9:05  PM

## 2019-05-12 NOTE — Patient Instructions (Addendum)
It is good to see you today. I'm glad you are feeling better today.  We will check on getting the PFT's ordered by Dr. Isidore Moos scheduled. You will need a COVID test 3 days prior. Please call if you have any changes in your secretions.  Check with your ENT about using the portable suction.  I will check with Dr. Valeta Harms about 2/24 appointment.  Please contact office for sooner follow up if symptoms do not improve or worsen or seek emergency care

## 2019-05-13 ENCOUNTER — Encounter: Payer: Self-pay | Admitting: Thoracic Surgery (Cardiothoracic Vascular Surgery)

## 2019-05-13 ENCOUNTER — Institutional Professional Consult (permissible substitution) (INDEPENDENT_AMBULATORY_CARE_PROVIDER_SITE_OTHER): Payer: Medicare Other | Admitting: Thoracic Surgery (Cardiothoracic Vascular Surgery)

## 2019-05-13 VITALS — BP 135/78 | HR 87 | Resp 18 | Ht 69.0 in | Wt 145.0 lb

## 2019-05-13 DIAGNOSIS — Z431 Encounter for attention to gastrostomy: Secondary | ICD-10-CM | POA: Diagnosis not present

## 2019-05-13 DIAGNOSIS — C3411 Malignant neoplasm of upper lobe, right bronchus or lung: Secondary | ICD-10-CM | POA: Diagnosis not present

## 2019-05-13 DIAGNOSIS — C760 Malignant neoplasm of head, face and neck: Secondary | ICD-10-CM

## 2019-05-13 NOTE — Progress Notes (Signed)
PCP is Sharion Balloon, FNP Referring Provider is Eppie Gibson, MD  Chief Complaint  Patient presents with  . Lung Cancer    RULobe.Marland KitchenMarland KitchenSurgical eval,  CT BX 04/30/19, PET Scan and Chest CT 04/02/19, HX of Larygneal cancer    HPI: Logan Price is sent for consultation regarding a left upper lobe groundglass opacity and right lung nodule.  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  He has a tracheostomy and PEG tube in place.  He recently had a follow-up CT scan which showed a new solid nodule in the right lung anteriorly.  It is near the minor fissure which is indistinct in the region of the tumor was either in the middle lobe or the inferior aspect of the upper lobe.  There also was more prominence noted in a groundglass nodule with some cavitation in the left upper lobe.  On PET CT both areas were hypermetabolic.  A CT-guided biopsy of the right lung nodule showed squamous cell carcinoma.  A navigational bronchoscopy of the left upper lobe nodule showed atypical cells concerning for adenocarcinoma.  He has had a tracheostomy in place.  He uses 2 L of home oxygen at night.  He lost about 40 pounds during his radiation treatment but has gained about 20 pounds back.  He denies any coughing or wheezing.  Zubrod Score: At the time of surgery this patient's most appropriate activity status/level should be described as: []     0    Normal activity, no symptoms []     1    Restricted in physical strenuous activity but ambulatory, able to do out light work [x]     2    Ambulatory and capable of self care, unable to do work activities, up and about >50 % of waking hours                              []     3    Only limited self care, in bed greater than 50% of waking hours []     4    Completely disabled, no self care,  confined to bed or chair []     5    Moribund  Past Medical History:  Diagnosis Date  . Diverticulitis   . Dysrhythmia 09/2018   episode of SVT while in hospital  . 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 GERD (gastroesophageal reflux disease)   . 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 HTN (hypertension)   . Hyperlipidemia     Past Surgical History:  Procedure Laterality Date  . BIOPSY  05/04/2015   Procedure: BIOPSY;  Surgeon: Danie Binder, MD;  [facility];  Service: Endoscopy;;  bx's of ileocecal valve   . BRONCHIAL BRUSHINGS  04/29/2019   Procedure: BRONCHIAL BRUSHINGS;  Surgeon: Garner Nash, DO;  Location: [place];  Service: Pulmonary;;  . BRONCHIAL NEEDLE ASPIRATION BIOPSY  04/29/2019   Procedure: BRONCHIAL NEEDLE ASPIRATION BIOPSIES;  Surgeon: Garner Nash, DO;  Location: [place];  Service: Pulmonary;;  . BRONCHIAL WASHINGS  04/29/2019   Procedure: BRONCHIAL WASHINGS;  Surgeon: Garner Nash, DO;  Location: [place];  Service: Pulmonary;;  . COLONOSCOPY WITH PROPOFOL N/A 05/04/2015   Dr. Oneida Alar: normal appearing ileum  with prominent IC valve with tubular adenomas, moderate diverticulosis in sigmoid colon, ascending colon, and retum. Moderate sized internal hemorrhoids. Surveillance in 5 years  . ELECTROMAGNETIC NAVIGATION BROCHOSCOPY  04/29/2019   Procedure: NAVIGATION BRONCHOSCOPY;  Surgeon: Garner Nash, DO;  Location: [facility];  Service: Pulmonary;;  . ESOPHAGOGASTRODUODENOSCOPY (EGD) WITH PROPOFOL N/A 12/19/2016   Procedure: ESOPHAGOGASTRODUODENOSCOPY (EGD) WITH PROPOFOL;  Surgeon: Danie Binder, MD;  [facility];  Service: Endoscopy;  Laterality: N/A;  11:30am  . FLEXIBLE SIGMOIDOSCOPY N/A 12/10/2015   hemorrhoid banding X 3   . HEMORRHOID BANDING N/A 12/10/2015   Procedure: HEMORRHOID BANDING;  Surgeon: Danie Binder, MD;  [facility];  Service: Endoscopy;  Laterality: N/A;  1:30 PM  . INSERTION  OF SUPRAPUBIC CATHETER N/A 02/24/2019   Procedure: INSERTION OF SUPRAPUBIC CATHETER;  Surgeon: Kathie Rhodes, MD;  Location: [facility];  Service: Urology;  Laterality: N/A;  . IR CM INJ ANY COLONIC TUBE W/FLUORO  10/30/2018  . IR GASTROSTOMY TUBE MOD SED  10/04/2018  . IR IMAGING GUIDED PORT INSERTION  10/04/2018  . IR REMOVAL TUN ACCESS W/ PORT W/O FL MOD SED  10/14/2018  . IR REPLACE G-TUBE SIMPLE WO FLUORO  11/03/2018  . LAPAROSCOPIC INSERTION GASTROSTOMY TUBE Left 10/07/2018   Procedure: LAPAROSCOPIC  GASTROSTOMY TUBE;  Surgeon: Kinsinger, Arta Bruce, MD;  Location: [place];  Service: General;  Laterality: Left;  Marland Kitchen MICROLARYNGOSCOPY N/A 09/27/2018   Procedure: MICRO DIRECT LARYNGOSCOPY WITH BIOPSY;  Surgeon: Leta Baptist, MD;  [facility];  Service: ENT;  Laterality: N/A;  . None to date     As of 04/14/15  . POLYPECTOMY  05/04/2015   Procedure: POLYPECTOMY;  Surgeon: Danie Binder, MD;  [facility];  Service: Endoscopy;;  descending colon polyp, ascending colon polyp  . SAVORY DILATION N/A 12/19/2016   Procedure: SAVORY DILATION;  Surgeon: Danie Binder, MD;  [facility];  Service: Endoscopy;  Laterality: N/A;  . TRACHEOSTOMY TUBE PLACEMENT N/A 09/27/2018   Procedure: AWAKE TRACHEOSTOMY;  Surgeon: Leta Baptist, MD;  Location: [place];  Service: ENT;  Laterality: N/A;  . TRANSURETHRAL RESECTION OF PROSTATE N/A 02/24/2019   Procedure: TRANSURETHRAL RESECTION OF THE PROSTATE (TURP);  Surgeon: Kathie Rhodes, MD;  Location[facility];  Service: Urology;  Laterality: N/A;  . VIDEO BRONCHOSCOPY WITH ENDOBRONCHIAL NAVIGATION N/A 04/29/2019   Procedure: VIDEO BRONCHOSCOPY;  Surgeon: Garner Nash, DO;  Location: [place];  Service: Pulmonary;  Laterality: N/A;    Family History  Problem Relation Age of Onset  . Cancer Mother   . Hypertension Father   . Hypertension Sister   . Hypertension Brother   . Hypertension Sister   . Aneurysm Brother 21       brain  . Colon cancer Neg Hx   .  Colon polyps Neg Hx     Social History Social History   Tobacco Use  . Smoking status: Former Smoker    Packs/day: 1.00    Years: 40.00    Pack years: 40.00    Types: Cigarettes    Quit date: 10/09/2006    Years since quitting: 12.6  . Smokeless tobacco: Never Used  Substance Use Topics  . Alcohol use: Not Currently    Alcohol/week: 0.0 standard drinks    Comment: he denies 01/06/19  . Drug use: No    Current Outpatient Medications  Medication Sig Dispense Refill  . Amino Acids-Protein Hydrolys (FEEDING SUPPLEMENT, PRO-STAT SUGAR FREE 64,)  LIQD Place 30 mLs into feeding tube 2 (two) times daily. 887 mL 15  . doxazosin (CARDURA) 1 MG tablet Place 1 tablet (1 mg total) into feeding tube daily. (Patient taking differently: Place 1 mg into feeding tube daily at 12 noon. ) 30 tablet 5  . Nutritional Supplements (KATE FARMS PEPTIDE 1.5) LIQD 325 mLs by PEG Tube route continuous. (Patient taking differently: 325 mLs by PEG Tube route 4 (four) times daily. ) 325 mL 12  . pantoprazole (PROTONIX) 40 MG tablet 1 po 30 mins prior to first meal (Patient taking differently: Take 40 mg by mouth daily. ) 90 tablet 3  . simvastatin (ZOCOR) 20 MG tablet Take 1 tablet (20 mg total) by mouth daily at 6 PM. 90 tablet 1  . Water For Irrigation, Sterile (FREE WATER) SOLN Place 200 mLs into feeding tube every 4 (four) hours.     No current facility-administered medications for this visit.    No Known Allergies  Review of Systems  Constitutional: Positive for activity change and unexpected weight change (lost 40 pounds, has gained 20 back).  HENT: Positive for trouble swallowing and voice change.        Tracheostomy  Respiratory: Positive for shortness of breath.        2L [place] O2 at night  Cardiovascular: Negative for chest pain and leg swelling.  Gastrointestinal:       PEG tube  Hematological: Negative for adenopathy. Does not bruise/bleed easily.    BP 135/78 (BP Location: Right Arm, Patient  Position: Sitting, Cuff Size: Normal)   Pulse 87   Resp 18   Ht 5\' 9"  (1.753 m)   Wt 145 lb (65.8 kg)   SpO2 96% Comment: RA...USES O2 HS @ 2 L  BMI 21.41 kg/m  Physical Exam Constitutional:      Appearance: Normal appearance.  HENT:     Head: Normocephalic and atraumatic.  Eyes:     Extraocular Movements: Extraocular movements intact.  Neck:     Comments: Tracheostomy Cardiovascular:     Rate and Rhythm: Normal rate and regular rhythm.     Heart sounds: No murmur.  Pulmonary:     Effort: Pulmonary effort is normal. No respiratory distress.     Breath sounds: Normal breath sounds. No wheezing or rales.  Abdominal:     General: There is no distension.     Palpations: Abdomen is soft.     Tenderness: There is no abdominal tenderness.  Musculoskeletal:     Comments: Thenar wasting  Skin:    General: Skin is warm and dry.  Neurological:     General: No focal deficit present.     Mental Status: He is alert and oriented to person, place, and time.     Motor: No weakness.    Diagnostic Tests: CT CHEST WITH CONTRAST  TECHNIQUE: Multidetector CT imaging of the chest was performed during intravenous contrast administration.  CONTRAST:  50mL OMNIPAQUE IOHEXOL 300 MG/ML  SOLN  COMPARISON:  09/29/2018  FINDINGS: Cardiovascular: Heart is normal in size.  No pericardial effusion.  No evidence of thoracic aortic aneurysm. Atherosclerotic calcifications of the aortic arch.  Three vessel coronary atherosclerosis.  Mediastinum/Nodes: No suspicious mediastinal lymphadenopathy.  Visualized thyroid is unremarkable.  Lungs/Pleura: Tracheostomy in satisfactory position.  13 x 15 mm solid nodule in the anterior/inferior right upper lobe (series 4/image 103), new, suspicious for metastasis.  17 x 25 mm ground-glass nodule in the left upper lobe/lingula (series 4/image 99), more conspicuous on the  prior, although without definite solid component.  Moderate  centrilobular emphysematous changes, upper lung predominant.  Biapical pleural-parenchymal scarring and/or radiation changes.  Scarring/atelectasis in the lingula and medial right middle lobe.  Calcified granulomata in the medial right lower lobe.  No pleural effusion or pneumothorax.  Upper Abdomen: Visualized upper abdomen is notable for a medial right upper pole renal cyst and a gastrostomy in satisfactory position.  Musculoskeletal: Degenerative changes of the visualized thoracolumbar spine.  IMPRESSION: 1.5 cm nodule in the anterior right upper lobe, new, suspicious for pulmonary metastasis.  2.5 cm ground-glass nodule in the left upper lobe/lingula, more conspicuous than on the prior, although without definite solid component. Continued attention on follow-up is suggested to exclude low-grade adenocarcinoma.  Additional ancillary findings as above.  Aortic Atherosclerosis (ICD10-I70.0) and Emphysema (ICD10-J43.9).   Electronically Signed   By: Julian Hy M.D.   On: 04/02/2019 12:44 NUCLEAR MEDICINE PET SKULL BASE TO THIGH  TECHNIQUE: 6.5 mCi F-18 FDG was injected intravenously. Full-ring PET imaging was performed from the skull base to thigh after the radiotracer. CT data was obtained and used for attenuation correction and anatomic localization.  Fasting blood glucose: 90 mg/dl  COMPARISON:  04/02/2019  FINDINGS: Mediastinal blood pool activity: SUV max 2.6  Liver activity: SUV max NA  NECK: Asymmetric increased tracer uptake within the larynx is identified. No focal nodular or masslike areas of increased uptake identified to suggest FDG avid residual tumor or FDG avid nodal metastases.  Incidental CT findings: none  CHEST: Bilateral FDG avid pulmonary nodules are identified. The solid subpleural nodule within the right middle lobe measures 1.6 cm and has an SUV max of 5.3. The sub solid nodule within the lingula measures  2.4 cm and has an SUV max of 3.0.  Increased uptake within the left AP window region and bilateral hilar regions noted. The SUV max in the right hilum is equal to 4.11. The FDG uptake scratch set the SUV max within the AP window lymph node is equal to 4.31. SUV max within the left hilum is equal to 3.8.  Incidental CT findings: Centrilobular emphysema. Aortic atherosclerosis. Three vessel coronary artery calcifications.  ABDOMEN/PELVIS: No abnormal hypermetabolic activity within the liver, pancreas, adrenal glands, or spleen. No hypermetabolic lymph nodes in the abdomen or pelvis.  Incidental CT findings: Aortic atherosclerosis. No aneurysm. Right kidney cysts. Gastrostomy tube. Probable sebaceous cyst within the left lower quadrant ventral abdominal wall measuring 2 cm without increased FDG uptake.  SKELETON: No focal hypermetabolic activity to suggest skeletal metastasis.  Incidental CT findings: none  IMPRESSION: 1. No specific findings identified to suggest residual FDG avid tumor within the neck. 2. Solid nodule within the anterior right middle lobe is FDG avid and worrisome for either metastatic disease or primary bronchogenic carcinoma. 3. Persistent sub solid nodule within the lingula with mild FDG uptake, SUV max 3.8. Low-grade pulmonary adenocarcinoma cannot be excluded. 4. Mild increased uptake identified within bilateral hilar regions and the left AP window region. Cannot exclude nodal metastasis. 5. Aortic Atherosclerosis (ICD10-I70.0) and Emphysema (ICD10-J43.9). 6. Coronary artery calcifications   Electronically Signed   By: Kerby Moors M.D.   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  He now has  bilateral lung nodules.  There is a solid nodule in the right lung that is biopsy-proven squamous cell carcinoma.  This is most likely a metastasis from his glottic carcinoma.  There also is a groundglass partially cavitary lesion in the left upper lobe that is an adenocarcinoma, consistent with a lung primary.  Logan Price did a 6-minute walk test where he was only able to go 748 feet.  He has a tracheostomy in place and is being fed with a PEG tube.  He has not had pulmonary function testing, but with that limited a 6-minute walk test I do not think that he is a candidate for major pulmonary resection.  A better option in his case would be radiation to both lesions.  CT evidence of coronary atherosclerosis and severe thoracic aortic atherosclerosis-currently asymptomatic  Plan: Follow-up with Dr. Graylon Gunning, MD [facility] [phone]

## 2019-05-13 NOTE — Progress Notes (Signed)
6 Minute Walk Test Results  Patient: Logan Price Date:  05/13/2019   Supplemental O2 during test?        none      Baseline   End  Time   12:00                        12:06    Heartrate  87    98 Dyspnea  0    sl Fatigue  0    sl O2 sat   96%    89% Blood pressure 135/78           149/78   Patient ambulated at a his normal pace for a total distance of 784 feet with no stops.  Ambulation was limited primarily due to nothing except for some low back tightness  Overall the test was tolerated well

## 2019-05-16 DIAGNOSIS — Z431 Encounter for attention to gastrostomy: Secondary | ICD-10-CM | POA: Diagnosis not present

## 2019-05-16 DIAGNOSIS — K9423 Gastrostomy malfunction: Secondary | ICD-10-CM | POA: Diagnosis not present

## 2019-05-16 DIAGNOSIS — C328 Malignant neoplasm of overlapping sites of larynx: Secondary | ICD-10-CM | POA: Diagnosis not present

## 2019-05-16 DIAGNOSIS — Z430 Encounter for attention to tracheostomy: Secondary | ICD-10-CM | POA: Diagnosis not present

## 2019-05-16 DIAGNOSIS — R1310 Dysphagia, unspecified: Secondary | ICD-10-CM | POA: Diagnosis not present

## 2019-05-16 DIAGNOSIS — C320 Malignant neoplasm of glottis: Secondary | ICD-10-CM | POA: Diagnosis not present

## 2019-05-17 ENCOUNTER — Ambulatory Visit: Payer: Medicare Other | Attending: Internal Medicine

## 2019-05-17 DIAGNOSIS — Z23 Encounter for immunization: Secondary | ICD-10-CM | POA: Insufficient documentation

## 2019-05-17 NOTE — Progress Notes (Signed)
Covid-19 Vaccination Clinic  Name:  CHANNER BIELAWSKI    MRN: [id number] DOB: [birth year]  05/17/2019  Mr. Streich was observed post Covid-19 immunization for 15 minutes without incidence. He was provided with Vaccine Information Sheet and instruction to access the V-Safe system.   Mr. Bidlack was instructed to call 911 with any severe reactions post vaccine: Marland Kitchen Difficulty breathing  . Swelling of your face and throat  . A fast heartbeat  . A bad rash all over your body  . Dizziness and weakness    Immunizations Administered    Name Date Dose VIS Date Route   Pfizer COVID-19 Vaccine 05/17/2019  2:36 PM 0.3 mL 03/07/2019 Intramuscular   Manufacturer: [employer], [employer]   Lot: [id number]   NDC: [address]-4

## 2019-05-20 ENCOUNTER — Telehealth: Payer: Self-pay

## 2019-05-20 ENCOUNTER — Inpatient Hospital Stay: Payer: Medicare Other | Attending: Hematology

## 2019-05-20 ENCOUNTER — Other Ambulatory Visit: Payer: Self-pay

## 2019-05-20 DIAGNOSIS — Z431 Encounter for attention to gastrostomy: Secondary | ICD-10-CM | POA: Diagnosis not present

## 2019-05-20 DIAGNOSIS — C328 Malignant neoplasm of overlapping sites of larynx: Secondary | ICD-10-CM | POA: Diagnosis not present

## 2019-05-20 DIAGNOSIS — K9423 Gastrostomy malfunction: Secondary | ICD-10-CM | POA: Diagnosis not present

## 2019-05-20 DIAGNOSIS — C320 Malignant neoplasm of glottis: Secondary | ICD-10-CM | POA: Diagnosis not present

## 2019-05-20 DIAGNOSIS — Z430 Encounter for attention to tracheostomy: Secondary | ICD-10-CM | POA: Diagnosis not present

## 2019-05-20 DIAGNOSIS — R1310 Dysphagia, unspecified: Secondary | ICD-10-CM | POA: Diagnosis not present

## 2019-05-20 NOTE — Progress Notes (Signed)
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  Patient received PEG in July 2020.  Noted patient has been evaluated by surgery for bilateral lung nodules.  MBSS on 12/30 and recommended honey thick liquids with puree foods with tube feeding to provide majority of nutritional needs.    Met with patient in clinic this am with wife.  Patient brought in food log.   2/22- ate 10/1/2 oz cream of potato soup 2/20- 10 1/2 oz of cream of chicken soup 2/19 10 1/2 oz of cream of potato soup 2/18 - 10 1/2 oz cream of chicken soup 2/14- jello 2/9 - 4 oz thickened tea (honey thick) 2/7 - puree baby food carrots 4 oz and [place] and gravy jar baby food 2/3 4oz honey thick tea 2/2 - 4 oz honey thick tea 1/31 jello and honey thick tea 1/29 - 4 oz honey thick tea  Patient reports that he is still being seen by SLP, [place] with [facility].  She will see him in his home for another week then he will have to start seeing SLP in the clinic.    Reports tube feeding continues [employer] Peptide 1.5 8am-8pm, 4 bottles via continuous pump.  Gives 225m water at 8am, 2pm, 5pm and 8pm.  Also gives prostat 344mat 8am and 8pm via tube.  Reports urinating without difficulty.  Having bowel movement about 2 times per day, not loose.  Reports that he feels full with tube feeding and does not get hungry.     Medications: reviewed  Labs: no new  Anthropometrics:   Weight 148 lb 4 oz today increased from 143 lb 6 oz on 1/26   Re-Estimated Energy Needs  Kcals: 2300-2500 Protein: 115-130 g Fluid: > 2 L  NUTRITION DIAGNOSIS: Inadequate oral intake continues   INTERVENTION:  Suggested patient switch tube feeding to 8pm and stop at 8am to allow to eat more during the day. Patient did not want to try due to fear of pulling tube out, rolling over during the night.   Stressed importance of eating more calories and protein orally in order to reduce tube feeding.  Provided diet  education on puree diet from AND. Handout given to wife.  Encouraged patient to start trying more foods at meal times.  RD contacted [place] patient's permission after visit.  SLP reports that she will be discharging patient from home health service in 2 weeks and [facility]       MONITORING, EVALUATION, GOAL: Patient will tolerate oral foods and tube feeding to maintain weight   NEXT VISIT: tomorrow to determine poc after RD spoke with SLP.    Zekiel Torian B. AlZenia ResidesRDMcKittrickLDOld Greenegistered Dietitian [phone])

## 2019-05-20 NOTE — Telephone Encounter (Signed)
error 

## 2019-05-21 ENCOUNTER — Telehealth: Payer: Self-pay

## 2019-05-21 ENCOUNTER — Ambulatory Visit (INDEPENDENT_AMBULATORY_CARE_PROVIDER_SITE_OTHER): Payer: Medicare Other | Admitting: Pulmonary Disease

## 2019-05-21 ENCOUNTER — Telehealth: Payer: Self-pay | Admitting: Pulmonary Disease

## 2019-05-21 ENCOUNTER — Encounter: Payer: Self-pay | Admitting: Pulmonary Disease

## 2019-05-21 ENCOUNTER — Telehealth: Payer: Self-pay | Admitting: *Deleted

## 2019-05-21 VITALS — BP 112/66 | HR 85 | Ht 69.0 in | Wt 148.4 lb

## 2019-05-21 DIAGNOSIS — C3492 Malignant neoplasm of unspecified part of left bronchus or lung: Secondary | ICD-10-CM | POA: Diagnosis not present

## 2019-05-21 DIAGNOSIS — A498 Other bacterial infections of unspecified site: Secondary | ICD-10-CM | POA: Diagnosis not present

## 2019-05-21 DIAGNOSIS — C320 Malignant neoplasm of glottis: Secondary | ICD-10-CM | POA: Diagnosis not present

## 2019-05-21 DIAGNOSIS — E43 Unspecified severe protein-calorie malnutrition: Secondary | ICD-10-CM

## 2019-05-21 DIAGNOSIS — C760 Malignant neoplasm of head, face and neck: Secondary | ICD-10-CM

## 2019-05-21 DIAGNOSIS — Z923 Personal history of irradiation: Secondary | ICD-10-CM | POA: Diagnosis not present

## 2019-05-21 DIAGNOSIS — C329 Malignant neoplasm of larynx, unspecified: Secondary | ICD-10-CM | POA: Diagnosis not present

## 2019-05-21 DIAGNOSIS — Z430 Encounter for attention to tracheostomy: Secondary | ICD-10-CM | POA: Diagnosis not present

## 2019-05-21 DIAGNOSIS — C328 Malignant neoplasm of overlapping sites of larynx: Secondary | ICD-10-CM | POA: Diagnosis not present

## 2019-05-21 DIAGNOSIS — K9423 Gastrostomy malfunction: Secondary | ICD-10-CM | POA: Diagnosis not present

## 2019-05-21 DIAGNOSIS — Z431 Encounter for attention to gastrostomy: Secondary | ICD-10-CM | POA: Diagnosis not present

## 2019-05-21 DIAGNOSIS — J40 Bronchitis, not specified as acute or chronic: Secondary | ICD-10-CM | POA: Diagnosis not present

## 2019-05-21 DIAGNOSIS — Z930 Tracheostomy status: Secondary | ICD-10-CM

## 2019-05-21 DIAGNOSIS — C3491 Malignant neoplasm of unspecified part of right bronchus or lung: Secondary | ICD-10-CM | POA: Insufficient documentation

## 2019-05-21 DIAGNOSIS — J432 Centrilobular emphysema: Secondary | ICD-10-CM

## 2019-05-21 DIAGNOSIS — R1310 Dysphagia, unspecified: Secondary | ICD-10-CM | POA: Diagnosis not present

## 2019-05-21 MED ORDER — ALBUTEROL SULFATE (2.5 MG/3ML) 0.083% IN NEBU: 2.5000 mg | INHALATION_SOLUTION | Freq: Four times a day (QID) | RESPIRATORY_TRACT | 12 refills | Status: DC | PRN

## 2019-05-21 MED ORDER — ALBUTEROL SULFATE (2.5 MG/3ML) 0.083% IN NEBU: INHALATION_SOLUTION | RESPIRATORY_TRACT | 12 refills | Status: DC

## 2019-05-21 NOTE — Progress Notes (Addendum)
Synopsis: Referred in January 2021 for left upper lobe opacity concerning for malignancy by [name], FNP  Subjective:   PATIENT ID: Logan Price GENDER: male DOB: 1945/07/06, MRN: [id number]  Chief Complaint  Patient presents with  . Follow-up    Pt states he has been doing well since last visit and denies any current complaints.    This is a 74 year old gentleman with a past medical history of squamous cell carcinoma of the head and neck.  Patient is followed by radiation oncology.  Patient underwent PEG tube placement, tracheostomy tube placement and subsequently completed 7 weeks of radiation to the head and neck.  Patient suffers from dysphagia due to this now.  Patient had recent follow-up with Dr. Basilio Cairo from radiation oncology.  Patient's last office note reviewed.  Patient had recent CT imaging of the chest April 02, 2019.  This revealed a 17 x 25 mm groundglass nodule within the upper lobe/lingula concerning for potential primary malignancy of the lung.  A PET scan was completed also on the same day which revealed PET avid uptake within the right upper lobe nodule 1.6 cm SUV of 5.3 and the left upper lobe/lingula measuring 2.4 cm with SUV max of 3.  Concerning for either metastatic disease and a potential primary pulmonary adenocarcinoma within the lingula.  Patient was referred to pulmonary for evaluation of electromagnetic navigational bronchoscopy and tissue sampling of the lingular groundglass opacity.  Patient seen today in the office with no complaints.  He does have his PMV in place over his cuffless trach he is able to communicate effectively.  Present with his wife today.  Overall anxious about the new findings of the PET scan but is interested in obtaining tissue diagnosis and would like to move forward as soon as possible.  OV 05/21/2019: Patient underwent navigational bronchoscopy.  Patient here for follow-up post bronchoscopy.  He states that he is breathing well post  procedure.  He still does have dyspnea on exertion and shortness of breath which is at his baseline.  He is currently on no bronchodilators.  Walks with a walker.  Tracheostomy tube in place with PMV.  He follows with Dr. Jodean Lima from ENT.  Patient has not had any hemoptysis.  He does have regular sputum production from his tracheostomy.  He tries to keep this clean.   Past Medical History:  Diagnosis Date  . Diverticulitis   . Dysrhythmia 09/2018   episode of SVT while in hospital  . 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 GERD (gastroesophageal reflux disease)   . 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 HTN (hypertension)   . Hyperlipidemia      Family History  Problem Relation Age of Onset  . Cancer Mother   . Hypertension Father   . Hypertension Sister   . Hypertension Brother   . Hypertension Sister   . Aneurysm Brother 32       brain  . Colon cancer Neg Hx   . Colon polyps Neg Hx      Past Surgical History:  Procedure Laterality Date  . BIOPSY  05/04/2015   Procedure: BIOPSY;  Surgeon: West Bali, MD;  [facility];  Service: Endoscopy;;  bx's of ileocecal valve   . BRONCHIAL BRUSHINGS  04/29/2019   Procedure: BRONCHIAL BRUSHINGS;  Surgeon: Josephine Igo, DO;  [facility];  Service: Pulmonary;;  . BRONCHIAL NEEDLE ASPIRATION BIOPSY  04/29/2019   Procedure: BRONCHIAL NEEDLE  ASPIRATION BIOPSIES;  Surgeon: Josephine Igo, DO;  [facility];  Service: Pulmonary;;  . BRONCHIAL WASHINGS  04/29/2019   Procedure: BRONCHIAL WASHINGS;  Surgeon: Josephine Igo, DO;  [facility];  Service: Pulmonary;;  . COLONOSCOPY WITH PROPOFOL N/A 05/04/2015   Dr. Darrick Penna: normal appearing ileum with prominent IC valve with tubular adenomas, moderate diverticulosis in sigmoid colon, ascending colon, and retum. Moderate sized internal hemorrhoids. Surveillance in 5 years  . ELECTROMAGNETIC NAVIGATION BROCHOSCOPY  04/29/2019   Procedure: NAVIGATION  BRONCHOSCOPY;  Surgeon: Josephine Igo, DO;  [facility];  Service: Pulmonary;;  . ESOPHAGOGASTRODUODENOSCOPY (EGD) WITH PROPOFOL N/A 12/19/2016   Procedure: ESOPHAGOGASTRODUODENOSCOPY (EGD) WITH PROPOFOL;  Surgeon: West Bali, MD;  [facility];  Service: Endoscopy;  Laterality: N/A;  11:30am  . FLEXIBLE SIGMOIDOSCOPY N/A 12/10/2015   hemorrhoid banding X 3   . HEMORRHOID BANDING N/A 12/10/2015   Procedure: HEMORRHOID BANDING;  Surgeon: West Bali, MD;  [facility];  Service: Endoscopy;  Laterality: N/A;  1:30 PM  . INSERTION OF SUPRAPUBIC CATHETER N/A 02/24/2019   Procedure: INSERTION OF SUPRAPUBIC CATHETER;  Surgeon: Ihor Gully, MD;  Location: [facility];  Service: Urology;  Laterality: N/A;  . IR CM INJ ANY COLONIC TUBE W/FLUORO  10/30/2018  . IR GASTROSTOMY TUBE MOD SED  10/04/2018  . IR IMAGING GUIDED PORT INSERTION  10/04/2018  . IR REMOVAL TUN ACCESS W/ PORT W/O FL MOD SED  10/14/2018  . IR REPLACE G-TUBE SIMPLE WO FLUORO  11/03/2018  . LAPAROSCOPIC INSERTION GASTROSTOMY TUBE Left 10/07/2018   Procedure: LAPAROSCOPIC  GASTROSTOMY TUBE;  Surgeon: Kinsinger, De Blanch, MD;  Location[facility];  Service: General;  Laterality: Left;  Marland Kitchen MICROLARYNGOSCOPY N/A 09/27/2018   Procedure: MICRO DIRECT LARYNGOSCOPY WITH BIOPSY;  Surgeon: Newman Pies, MD;  Location: [name];  Service: ENT;  Laterality: N/A;  . None to date     As of 04/14/15  . POLYPECTOMY  05/04/2015   Procedure: POLYPECTOMY;  Surgeon: West Bali, MD;  [facility];  Service: Endoscopy;;  descending colon polyp, ascending colon polyp  . SAVORY DILATION N/A 12/19/2016   Procedure: SAVORY DILATION;  Surgeon: West Bali, MD;  [facility];  Service: Endoscopy;  Laterality: N/A;  . TRACHEOSTOMY TUBE PLACEMENT N/A 09/27/2018   Procedure: AWAKE TRACHEOSTOMY;  Surgeon: Newman Pies, MD;  [facility];  Service: ENT;  Laterality: N/A;  . TRANSURETHRAL RESECTION OF PROSTATE N/A 02/24/2019     Procedure: TRANSURETHRAL RESECTION OF THE PROSTATE (TURP);  Surgeon: Ihor Gully, MD;  [facility]: [facility];  Service: Urology;  Laterality: N/A;  . VIDEO BRONCHOSCOPY WITH ENDOBRONCHIAL NAVIGATION N/A 04/29/2019   Procedure: VIDEO BRONCHOSCOPY;  Surgeon: Josephine Igo, DO;  [facility];  Service: Pulmonary;  Laterality: N/A;    Social History   Socioeconomic History  . Marital status: Married    Spouse name: Not on file  . Number of children: 9  . Years of education: GED  . Highest education level: GED or equivalent  Occupational History  . Occupation: Retired    Comment: [employer]  Tobacco Use  . Smoking status: Former Smoker    Packs/day: 1.00    Years: 40.00    Pack years: 40.00    Types: Cigarettes    Quit date: 10/09/2006    Years since quitting: 12.6  . Smokeless tobacco: Never Used  Substance and Sexual Activity  . Alcohol use: Not  Currently    Alcohol/week: 0.0 standard drinks    Comment: he denies 01/06/19  . Drug use: No  . Sexual activity: Yes    Partners: Female    Birth control/protection: None  Other Topics Concern  . Not on file  Social History Narrative   Lives with wife      9 children      GED   Social Determinants of Health   Financial Resource Strain:   . Difficulty of Paying Living Expenses: Not on file  Food Insecurity:   . Worried About Programme researcher, broadcasting/film/video in the Last Year: Not on file  . Ran Out of Food in the Last Year: Not on file  Transportation Needs:   . Lack of Transportation (Medical): Not on file  . Lack of Transportation (Non-Medical): Not on file  Physical Activity:   . Days of Exercise per Week: Not on file  . Minutes of Exercise per Session: Not on file  Stress:   . Feeling of Stress : Not on file  Social Connections:   . Frequency of Communication with Friends and Family: Not on file  . Frequency of Social Gatherings with Friends and Family: Not on file  . Attends Religious Services: Not  on file  . Active Member of Clubs or Organizations: Not on file  . Attends Banker Meetings: Not on file  . Marital Status: Not on file  Intimate Partner Violence:   . Fear of Current or Ex-Partner: Not on file  . Emotionally Abused: Not on file  . Physically Abused: Not on file  . Sexually Abused: Not on file     No Known Allergies   Outpatient Medications Prior to Visit  Medication Sig Dispense Refill  . Amino Acids-Protein Hydrolys (FEEDING SUPPLEMENT, PRO-STAT SUGAR FREE 64,) LIQD Place 30 mLs into feeding tube 2 (two) times daily. 887 mL 15  . doxazosin (CARDURA) 1 MG tablet Place 1 tablet (1 mg total) into feeding tube daily. (Patient taking differently: Place 1 mg into feeding tube daily at 12 noon. ) 30 tablet 5  . Nutritional Supplements (KATE FARMS PEPTIDE 1.5) LIQD 325 mLs by PEG Tube route continuous. (Patient taking differently: 325 mLs by PEG Tube route 4 (four) times daily. ) 325 mL 12  . pantoprazole (PROTONIX) 40 MG tablet 1 po 30 mins prior to first meal (Patient taking differently: Take 40 mg by mouth daily. ) 90 tablet 3  . simvastatin (ZOCOR) 20 MG tablet Take 1 tablet (20 mg total) by mouth daily at 6 PM. 90 tablet 1  . Water For Irrigation, Sterile (FREE WATER) SOLN Place 200 mLs into feeding tube every 4 (four) hours.     No facility-administered medications prior to visit.    Review of Systems  Constitutional: Negative for chills, fever, malaise/fatigue and weight loss.  HENT: Negative for hearing loss, sore throat and tinnitus.   Eyes: Negative for blurred vision and double vision.  Respiratory: Positive for cough, sputum production and shortness of breath. Negative for hemoptysis, wheezing and stridor.   Cardiovascular: Negative for chest pain, palpitations, orthopnea, leg swelling and PND.  Gastrointestinal: Negative for abdominal pain, constipation, diarrhea, heartburn, nausea and vomiting.  Genitourinary: Negative for dysuria, hematuria and  urgency.  Musculoskeletal: Negative for joint pain and myalgias.  Skin: Negative for itching and rash.  Neurological: Negative for dizziness, tingling, weakness and headaches.  Endo/Heme/Allergies: Negative for environmental allergies. Does not bruise/bleed easily.  Psychiatric/Behavioral: Negative for depression. The patient is  not nervous/anxious and does not have insomnia.   All other systems reviewed and are negative.    Objective:  Physical Exam Vitals reviewed.  Constitutional:      General: He is not in acute distress.    Appearance: He is well-developed.  HENT:     Head: Normocephalic and atraumatic.     Mouth/Throat:     Pharynx: No oropharyngeal exudate.  Eyes:     Conjunctiva/sclera: Conjunctivae normal.     Pupils: Pupils are equal, round, and reactive to light.  Neck:     Vascular: No JVD.     Trachea: No tracheal deviation.     Comments: Loss of supraclavicular fat Tracheostomy tube in place, PMV Cardiovascular:     Rate and Rhythm: Normal rate and regular rhythm.     Heart sounds: S1 normal and S2 normal.     Comments: Distant heart tones Pulmonary:     Effort: No tachypnea or accessory muscle usage.     Breath sounds: No stridor. Decreased breath sounds (throughout all lung fields) present. No wheezing, rhonchi or rales.  Abdominal:     General: Bowel sounds are normal. There is no distension.     Palpations: Abdomen is soft.     Tenderness: There is no abdominal tenderness.  Musculoskeletal:        General: Deformity (muscle wasting ) present.  Skin:    General: Skin is warm and dry.     Capillary Refill: Capillary refill takes less than 2 seconds.     Findings: No rash.  Neurological:     Mental Status: He is alert and oriented to person, place, and time.  Psychiatric:        Behavior: Behavior normal.      Vitals:   05/21/19 1413  BP: 112/66  Pulse: 85  SpO2: 96%  Weight: 148 lb 6.4 oz (67.3 kg)  Height: 5\' 9"  (1.753 m)   96% on RA BMI  Readings from Last 3 Encounters:  05/21/19 21.91 kg/m  05/13/19 21.41 kg/m  05/12/19 21.47 kg/m   Wt Readings from Last 3 Encounters:  05/21/19 148 lb 6.4 oz (67.3 kg)  05/13/19 145 lb (65.8 kg)  05/12/19 145 lb 6.4 oz (66 kg)     CBC    Component Value Date/Time   WBC 10.1 04/30/2019 0935   RBC 4.17 (L) 04/30/2019 0935   HGB 11.8 (L) 04/30/2019 0935   HGB 12.3 (L) 04/09/2019 1238   HCT 37.7 (L) 04/30/2019 0935   HCT 37.3 (L) 04/09/2019 1238   PLT 212 04/30/2019 0935   PLT 279 04/09/2019 1238   MCV 90.4 04/30/2019 0935   MCV 86 04/09/2019 1238   MCH 28.3 04/30/2019 0935   MCHC 31.3 04/30/2019 0935   RDW 14.3 04/30/2019 0935   RDW 13.0 04/09/2019 1238   LYMPHSABS 0.7 04/09/2019 1238   MONOABS 0.6 03/08/2019 2230   EOSABS 0.1 04/09/2019 1238   BASOSABS 0.0 04/09/2019 1238     Chest Imaging: CT chest contrast 04/02/2019: Right middle lobe lung nodule, new, left upper lobe lingula groundglass opacity concerning for primary malignancy. The patient's images have been independently reviewed by me.    Nuclear medicine pet imaging 04/02/2019: PET avid uptake within the right middle lobe nodule as well as the left upper lobe lingular groundglass opacity both concerning for either primary legacy versus metastatic disease. The patient's images have been independently reviewed by me.    Pulmonary Functions Testing Results: No flowsheet data found.  Assessment & Plan:     ICD-10-CM   1. Adenocarcinoma, lung, left (HCC)  C34.92 Ambulatory Referral for DME  2. Squamous cell lung cancer, right (HCC)  C34.91 Ambulatory Referral for DME  3. 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HCC)  C32.9 Ambulatory Referral for DME  4. 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HCC)  C34.92   5. Head and neck cancer (HCC)  C76.0   6. Protein-calorie malnutrition, severe  E43   7. Tracheostomy tube present (HCC)  Z93.0 Ambulatory Referral for DME  8. Hx of radiation therapy  Z92.3   9. Pseudomonas aeruginosa infection   A49.8 Ambulatory Referral for DME  10. Tracheobronchitis  J40 Ambulatory Referral for DME  11. Centrilobular emphysema (HCC)  J43.2      Assessment:   This is a 74 year old gentleman history of recurrent laryngeal cancer status post tracheostomy tube placement, PEG tube placement status post radiation to the head and neck.  Likely COPD with evidence of centrilobular emphysema on CT imaging.  New recent CT imaging and nuclear medicine pet findings concerning for a left-sided primary lung cancer status post navigational bronchoscopy consistent with adenocarcinoma.  CT-guided biopsy of the upper lobe right-sided lesion confirming squamous cell carcinoma consistent with metastasis.  Patient was seen by cardiothoracic surgery for evaluation of possible left-sided lesion resection.  Decision made for best served possibility of SBRT to this lesion instead of resection.  Decision made for referral back to radiation oncology Dr. Basilio Cairo.  Therefore at this time does appear patient has new diagnosis of primary lung cancer of the left upper lobe as well as metastatic disease of his squamous cell head neck cancer.  Bronchoscopy culture results also positive for Pseudomonas aeruginosa.  Patient was treated with antimicrobials for concern of tracheobronchitis.  Plan Following Extensive Data Review & Interpretation:  . I reviewed prior external note(s) from 05/13/2019 cardiothoracic surgery consultation Andrey Spearman reviewed . I reviewed the result(s) of pathology from navigational bronchoscopy 04/29/2019 atypical cells concerning for adenocarcinoma from left upper lobe lesion.  Pathology from CT-guided biopsy 04/30/2019 right upper lobe consistent with squamous cell carcinoma. . I have ordered DME supply for albuterol nebs for management of underlying COPD, centrilobular emphysema.  Independent interpretation of tests . Review of patient's 04/30/2019 images revealed evidence of CT-guided biopsy, 04/29/2019 chest x-ray  no evidence of pneumothorax appropriate placement of fiducials. The patient's images have been independently reviewed by me.    Discussion of management and follow-up with Dr. Basilio Cairo and Dr. Dion Body, radiation and medical oncology.   Josephine Igo, DO [facility] 05/21/2019 2:24 PM

## 2019-05-21 NOTE — Telephone Encounter (Signed)
Scheduled appt per 2/24 sch message - - pt aware of appt date and time

## 2019-05-21 NOTE — Telephone Encounter (Signed)
VM from [place] w/ [facility] Pt will be discharged from [place] next week Would like pt to be seen at [place] w/ Genene Churn Please advise

## 2019-05-21 NOTE — Patient Instructions (Addendum)
Thank you for visiting Dr. Valeta Harms at [facility]. Today we recommend the following:  DME supply for albuterol nebs.   Return in about 4 months (around 09/18/2019).    Please do your part to reduce the spread of COVID-19.

## 2019-05-21 NOTE — Telephone Encounter (Signed)
Nutrition Follow-up:  Spoke with patient this am by phone following phone call with Home Health SLP.     INTERVENTION:  Due to patient's compliant of feeling full with tube feeding will reduce [place] Peptide 1.5 to 3 cartons per day vs 4.  Patient will increase rate on pump to 110ml/hr and run only for 8 hours (8am-4pm).  RD had suggested that patient run tube feeding from 12-8pm to allow him to feel more hungry in the am and noon time but patient did not want to do that.  Patient reports that he knows how to change rate on pump.  Flush will stay the same and will continue prostat 34ml BID.   Discussed with patient he will need to make up calories and protein orally that 1 carton of tube feeding will provide.  Talked to patient about putting together a meal (ie puree meat, puree vegetable and puree fruit).  Reviewed that all foods need to be pureed with honey thick liquids. Discussed with patient that [facility] make premade puree foods (frozen) and shipped to house via [employer].  Patient reports that he will have brother look into this as he orders online.  Patient has a blender at home as well.   Discussed option of thickening oral nutrition supplements (ensure plus or boost plus) and drinking during the day.   Patient denied having questions at this time. Has RD contact information      NEXT VISIT: 3 weeks to measure weight.   Sent note to scheduling to schedule patient as needs transportation.  Logan Price, [place], [place] Registered Dietitian [phone] (pager)

## 2019-05-21 NOTE — Telephone Encounter (Signed)
Spoke with Hinton Dyer, CMA who had spoken to [place] about another pt. Juliann Pulse stated to Hinton Dyer what all I needed to place in the order for pt's neb supplies. Order was placed for pt after OV with Dr. Valeta Harms. Nothing further needed.

## 2019-05-21 NOTE — Telephone Encounter (Signed)
Called [facility], spoke with [place]. Stacy stated Albuterol can not read every 6 hrs as needed, per Medicare.   Albuterol nebs order given every 6 hours as directed by provider given and accepted. Albuterol nebs for 1 month is 133ml, Stacy stated they would  change amount to 117ml, with refills. Nothing further at this time.

## 2019-05-21 NOTE — Addendum Note (Signed)
Addended by: Lorretta Harp on: 05/21/2019 02:44 PM   Modules accepted: Orders

## 2019-05-22 ENCOUNTER — Telehealth: Payer: Self-pay | Admitting: Pulmonary Disease

## 2019-05-22 NOTE — Telephone Encounter (Signed)
Returned call to [name], spoke with Elmon Else.  Lorenza stated Patient did not have neb machine and was unsure where to get it. Advised Lorenza that DME order was placed 05/21/19 for neb machine, and order was with [facility]. Understanding stated.  Nothing further at this time.

## 2019-05-22 NOTE — Telephone Encounter (Signed)
Referral placed.

## 2019-05-22 NOTE — Telephone Encounter (Signed)
Called and spoke with Patient. Patient stated he did not have a neb machine.  DME order was placed 05/21/19 for home neb machine.  DME order has been sent to [facility]. Patient stated understanding.  Nothing further at this time.

## 2019-05-22 NOTE — Addendum Note (Signed)
Addended by: Evelina Dun A on: 05/22/2019 02:01 PM   Modules accepted: Orders

## 2019-05-26 ENCOUNTER — Other Ambulatory Visit: Payer: Self-pay

## 2019-05-26 ENCOUNTER — Ambulatory Visit
Admission: RE | Admit: 2019-05-26 | Discharge: 2019-05-26 | Disposition: A | Discharge status: Home / Self Care | Payer: Medicare Other | Source: Ambulatory Visit | Attending: Radiation Oncology | Admitting: Radiation Oncology

## 2019-05-26 DIAGNOSIS — Z510 Encounter for antineoplastic radiation therapy: Secondary | ICD-10-CM | POA: Diagnosis not present

## 2019-05-26 DIAGNOSIS — C3412 Malignant neoplasm of upper lobe, left bronchus or lung: Secondary | ICD-10-CM | POA: Insufficient documentation

## 2019-05-26 DIAGNOSIS — C3411 Malignant neoplasm of upper lobe, right bronchus or lung: Secondary | ICD-10-CM | POA: Diagnosis not present

## 2019-05-27 DIAGNOSIS — C3411 Malignant neoplasm of upper lobe, right bronchus or lung: Secondary | ICD-10-CM | POA: Insufficient documentation

## 2019-05-27 DIAGNOSIS — C3412 Malignant neoplasm of upper lobe, left bronchus or lung: Secondary | ICD-10-CM | POA: Insufficient documentation

## 2019-05-27 NOTE — Progress Notes (Signed)
Radiation Oncology         [phone] ________________________________  Name: Logan Price MRN: [id number]  Date: 05/26/2019  DOB: [birth year]  Brief Note Prior to CT simulation  CC: Logan Spencer, FNP  Newman Pies, MD  Diagnosis and Prior Radiotherapy:       ICD-10-CM   1. Malignant neoplasm of right upper lobe of lung (HCC)  C34.11   2. Primary cancer of left upper lobe of lung (HCC)  C34.12       Narrative:  The patient returns today after being seen by cardiothoracic surgery and pulmonology for work-up and treatment options related to bilateral lung nodules.  Ultimately, he underwent bronchoscopy and biopsy by pulmonology of the left upper lobe/lingular nodule which revealed adenocarcinoma.  This is considered a new primary.  Fiducials were placed at the tumor site.  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  This may be an oligo metastatic lesion.  Ultimately, the patient was not felt to be an optimal candidate for surgery and here he is today for discussion of treatment options, specifically SBRT.  Pulmonary function testing could not be performed due to his tracheostomy    He denies any new symptoms.  He is here with his wife today.          ALLERGIES:  has No Known Allergies.  Meds: Current Outpatient Medications  Medication Sig Dispense Refill  . albuterol (PROVENTIL) (2.5 MG/3ML) 0.083% nebulizer solution every 6 hours as directed by provider 120 mL 12  . Amino Acids-Protein Hydrolys (FEEDING SUPPLEMENT, PRO-STAT SUGAR FREE 64,) LIQD Place 30 mLs into feeding tube 2 (two) times daily. 887 mL 15  . doxazosin (CARDURA) 1 MG tablet Place 1 tablet (1 mg total) into feeding tube daily. (Patient taking differently: Place 1 mg into feeding tube daily at 12 noon. ) 30 tablet 5  . Nutritional Supplements (KATE FARMS PEPTIDE 1.5) LIQD 325 mLs by PEG Tube route continuous. (Patient taking differently: 325 mLs by PEG  Tube route 4 (four) times daily. ) 325 mL 12  . pantoprazole (PROTONIX) 40 MG tablet 1 po 30 mins prior to first meal (Patient taking differently: Take 40 mg by mouth daily. ) 90 tablet 3  . simvastatin (ZOCOR) 20 MG tablet Take 1 tablet (20 mg total) by mouth daily at 6 PM. 90 tablet 1  . Water For Irrigation, Sterile (FREE WATER) SOLN Place 200 mLs into feeding tube every 4 (four) hours.     No current facility-administered medications for this encounter.    Physical Findings: The patient is in no acute distress. Patient is alert and oriented. Wt Readings from Last 3 Encounters:  05/21/19 148 lb 6.4 oz (67.3 kg)  05/13/19 145 lb (65.8 kg)  05/12/19 145 lb 6.4 oz (66 kg)    vitals were not taken for this visit. .  General: Alert and oriented, in no acute distress He is sitting in a wheelchair  Psychiatric: Judgment and insight are intact. Affect is appropriate.   Lab Findings: Lab Results  Component Value Date   WBC 10.1 04/30/2019   HGB 11.8 (L) 04/30/2019   HCT 37.7 (L) 04/30/2019   MCV 90.4 04/30/2019   PLT 212 04/30/2019    Lab Results  Component Value Date   TSH 3.910 04/09/2019    Radiographic Findings: CT BIOPSY  Result Date: 04/30/2019 INDICATION: 74 year old male referred for right upper lobe FDG avid lung nodule biopsy EXAM:  CT BIOPSY MEDICATIONS: None. ANESTHESIA/SEDATION: Moderate (conscious) sedation was employed during this procedure. A total of Versed 1.0 mg and Fentanyl 50 mcg was administered intravenously. Moderate Sedation Time: 15 minutes. The patient's level of consciousness and vital signs were monitored continuously by radiology nursing throughout the procedure under my direct supervision. FLUOROSCOPY TIME:  CT COMPLICATIONS: None PROCEDURE: The procedure, risks, benefits, and alternatives were explained to the patient and the patient's family. Specific risks that were addressed included bleeding, infection, pneumothorax, need for further procedure  including chest tube placement, chance of delayed pneumothorax or hemorrhage, hemoptysis, nondiagnostic sample, cardiopulmonary collapse, death. Questions regarding the procedure were encouraged and answered. The patient understands and consents to the procedure. Patient was positioned in the supine position on the CT gantry table and a scout CT of the chest was performed for planning purposes. Once angle of approach was determined, the skin and subcutaneous tissues this scan was prepped and draped in the usual sterile fashion, and a sterile drape was applied covering the operative field. A sterile gown and sterile gloves were used for the procedure. Local anesthesia was provided with 1% Lidocaine. The skin and subcutaneous tissues were infiltrated 1% lidocaine for local anesthesia, and a small stab incision was made with an 11 blade scalpel. Using CT guidance, a 17 gauge trocar needle was advanced into the right upper lobetarget. After confirmation of the tip, separate 18 gauge core biopsies were performed. These were placed into solution for transportation to the lab. Biosentry Device was deployed. A final CT image was performed. Patient tolerated the procedure well and remained hemodynamically stable throughout. No complications were encountered and no significant blood loss was encounter IMPRESSION: Status post CT-guided right lung nodule biopsy. Signed, Yvone Neu. Reyne Dumas, RPVI [facility] Radiology Electronically Signed   By: Gilmer Mor D.O.   On: 04/30/2019 16:21   DG Chest Port 1 View  Result Date: 04/30/2019 CLINICAL DATA:  Status post biopsy.  Evaluate for pneumothorax. EXAM: PORTABLE CHEST 1 VIEW COMPARISON:  04/29/2019 FINDINGS: Tracheostomy tube tip is above the carina. Heart size is normal. There is no pleural effusion or edema. No pneumothorax following lung biopsy. Right lower lobe lung lesion is again identified. IMPRESSION: 1. No pneumothorax  status post lung biopsy. Electronically Signed   By: Signa Kell M.D.   On: 04/30/2019 14:02   DG CHEST PORT 1 VIEW  Result Date: 04/29/2019 CLINICAL DATA:  74 year old male status post via bronchoscopy. Evaluate for pneumothorax. EXAM: PORTABLE CHEST 1 VIEW COMPARISON:  Chest radiograph dated 10/19/2018 and CT dated 04/02/2019. FINDINGS: Tracheostomy with tip approximately 7 cm above the carina. A 2 cm nodular density in the right lower lung field corresponds to the nodule seen on the prior CT. There is interstitial coarsening of the left mid to lower lung field which may corresponds to the ground-glass nodular area seen on the prior CT. No new consolidative changes. There is no pleural effusion or pneumothorax. The cardiac silhouette is within normal limits. Atherosclerotic calcification of the aorta. No acute osseous pathology. Multiple surgical clips overlie the left lung IMPRESSION: No pneumothorax. Electronically Signed   By: Elgie Collard M.D.   On: 04/29/2019 15:50   DG C-ARM BRONCHOSCOPY  Result Date: 04/29/2019 C-ARM BRONCHOSCOPY: Fluoroscopy was utilized by the requesting physician.  No radiographic interpretation.    Impression/Plan:   Today, I talked to the patient about the findings and work-up thus far. We discussed the patient's diagnosis of bilateral lung nodules, both  cancerous as described above, and general treatment for this, highlighting the role of radiotherapy in the management. We discussed the available radiation techniques, and focused on the details of logistics and delivery.  I recommend SBRT to these lesions.  He is enthusiastic to proceed.  We discussed the risks, benefits, and side effects of radiotherapy. Side effects may include but not necessarily be limited to: Fatigue, inflammation of the lung, rib fracture, chest pain, rare internal organ injury, no guarantees of treatment were given. A consent form was signed and placed in the patient's medical  record.  The patient was encouraged to ask questions that I answered to the best of my ability.   We will proceed with CT simulation this morning.  _____________________________________   Lonie Peak, MD

## 2019-05-28 ENCOUNTER — Encounter (HOSPITAL_COMMUNITY): Payer: Self-pay | Admitting: Speech Pathology

## 2019-05-28 ENCOUNTER — Other Ambulatory Visit: Payer: Self-pay

## 2019-05-28 ENCOUNTER — Ambulatory Visit (HOSPITAL_COMMUNITY): Payer: Medicare Other | Attending: Family | Admitting: Speech Pathology

## 2019-05-28 DIAGNOSIS — R1313 Dysphagia, pharyngeal phase: Secondary | ICD-10-CM | POA: Insufficient documentation

## 2019-05-28 DIAGNOSIS — R1310 Dysphagia, unspecified: Secondary | ICD-10-CM | POA: Diagnosis not present

## 2019-05-28 NOTE — Therapy (Signed)
[facility] [address] [place], [place], [address] Phone: [phone]   Fax:  [phone]  Speech Language Pathology Evaluation  Patient Details  Name: Logan Price MRN: [id number] Date of Birth: 08/15/1945 Referring Provider (SLP): Lonie Peak, MD   Encounter Date: 05/28/2019  End of Session - 05/28/19 1254    Visit Number  1    Number of Visits  9    Date for SLP Re-Evaluation  07/02/19    Authorization Type  Medicare    SLP Start Time  0945    SLP Stop Time   1030    SLP Time Calculation (min)  45 min    Activity Tolerance  Patient tolerated treatment well       Past Medical History:  Diagnosis Date  . Diverticulitis   . Dysrhythmia 09/2018   episode of SVT while in hospital  . 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 GERD (gastroesophageal reflux disease)   . 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 HTN (hypertension)   . Hyperlipidemia     Past Surgical History:  Procedure Laterality Date  . BIOPSY  05/04/2015   Procedure: BIOPSY;  Surgeon: West Bali, MD;  [facility];  Service: Endoscopy;;  bx's of ileocecal valve   . BRONCHIAL BRUSHINGS  04/29/2019   Procedure: BRONCHIAL BRUSHINGS;  Surgeon: Josephine Igo, DO;  Location: [facility];  Service: Pulmonary;;  . BRONCHIAL NEEDLE ASPIRATION BIOPSY  04/29/2019   Procedure: BRONCHIAL NEEDLE ASPIRATION BIOPSIES;  Surgeon: Josephine Igo, DO;  Location: [facility];  Service: Pulmonary;;  . BRONCHIAL WASHINGS  04/29/2019   Procedure: BRONCHIAL WASHINGS;  Surgeon: Josephine Igo, DO;  Location: [facility];  Service: Pulmonary;;  . COLONOSCOPY WITH PROPOFOL N/A 05/04/2015   Dr. Darrick Penna: normal appearing ileum with prominent IC valve with tubular adenomas, moderate diverticulosis in sigmoid colon, ascending colon, and retum. Moderate sized internal hemorrhoids. Surveillance in 5 years  . ELECTROMAGNETIC NAVIGATION BROCHOSCOPY  04/29/2019    Procedure: NAVIGATION BRONCHOSCOPY;  Surgeon: Josephine Igo, DO;  [facility]: [facility];  Service: Pulmonary;;  . ESOPHAGOGASTRODUODENOSCOPY (EGD) WITH PROPOFOL N/A 12/19/2016   Procedure: ESOPHAGOGASTRODUODENOSCOPY (EGD) WITH PROPOFOL;  Surgeon: West Bali, MD;  [facility];  Service: Endoscopy;  Laterality: N/A;  11:30am  . FLEXIBLE SIGMOIDOSCOPY N/A 12/10/2015   hemorrhoid banding X 3   . HEMORRHOID BANDING N/A 12/10/2015   Procedure: HEMORRHOID BANDING;  Surgeon: West Bali, MD;  [facility];  Service: Endoscopy;  Laterality: N/A;  1:30 PM  . INSERTION OF SUPRAPUBIC CATHETER N/A 02/24/2019   Procedure: INSERTION OF SUPRAPUBIC CATHETER;  Surgeon: Ihor Gully, MD;  Location: [facility];  Service: Urology;  Laterality: N/A;  . IR CM INJ ANY COLONIC TUBE W/FLUORO  10/30/2018  . IR GASTROSTOMY TUBE MOD SED  10/04/2018  . IR IMAGING GUIDED PORT INSERTION  10/04/2018  . IR REMOVAL TUN ACCESS W/ PORT W/O FL MOD SED  10/14/2018  . IR REPLACE G-TUBE SIMPLE WO FLUORO  11/03/2018  . LAPAROSCOPIC INSERTION GASTROSTOMY TUBE Left 10/07/2018   Procedure: LAPAROSCOPIC  GASTROSTOMY TUBE;  Surgeon: Kinsinger, De Blanch, MD;  Location: [facility];  Service: General;  Laterality: Left;  Marland Kitchen MICROLARYNGOSCOPY N/A 09/27/2018   Procedure: MICRO DIRECT LARYNGOSCOPY WITH BIOPSY;  Surgeon: Newman Pies, MD;  [facility];  Service: ENT;  Laterality: N/A;  . None to date     As of 04/14/15  .  POLYPECTOMY  05/04/2015   Procedure: POLYPECTOMY;  Surgeon: West Bali, MD;  [facility];  Service: Endoscopy;;  descending colon polyp, ascending colon polyp  . SAVORY DILATION N/A 12/19/2016   Procedure: SAVORY DILATION;  Surgeon: West Bali, MD;  [facility];  Service: Endoscopy;  Laterality: N/A;  . TRACHEOSTOMY TUBE PLACEMENT N/A 09/27/2018   Procedure: AWAKE TRACHEOSTOMY;  Surgeon: Newman Pies, MD;  [facility]: [facility];  Service: ENT;  Laterality: N/A;  . TRANSURETHRAL RESECTION OF  PROSTATE N/A 02/24/2019   Procedure: TRANSURETHRAL RESECTION OF THE PROSTATE (TURP);  Surgeon: Ihor Gully, MD;  Location: [facility];  Service: Urology;  Laterality: N/A;  . VIDEO BRONCHOSCOPY WITH ENDOBRONCHIAL NAVIGATION N/A 04/29/2019   Procedure: VIDEO BRONCHOSCOPY;  Surgeon: Josephine Igo, DO;  [facility];  Service: Pulmonary;  Laterality: N/A;   <<MBSS 03/26/19: Pt presents with moderate oropharyngeal dysphagia with resultant aspiration of thin liquid and laryngeal penetration of nectar-thick and honey-thick liquid on today's examination.  Pt with noted improvement from previous MBS on 10/29/2018.  Aspiration and laryngeal penetration were secondary to decreased laryngeal closure and delayed swallow initiation at the level of the pyriform sinuses.  Trace aspiration of thin liquid was silent; however, pt was able to clear his laryngeal vestibule effectively with a cued throat clear.  Pt exhibited laryngeal penetration to his vocal folds with nectar-thick liquid that he was sensate to and partially cleared with a throat clear, however, some residual liquid remained in his laryngeal vestibule.  No laryngeal penetration was observed with honey-thick liquid before/during the swallow, but he had trace vallecular and pyriform residue that spilled into his laryngeal vestibule after the swallow when fluoro was turned on for next po trial.  No laryngeal penetration or aspiration was observed with puree or a small regular solid trial.  Of note, pt had his PMV donned for all po trials during this evaluation. Oral phase was remarkable for reduced lingual control resulting in premature spillage to the pharynx and reduced lingual strength resulting in trace oral residue.  Pharyngeal phase was remarkable for reduced BOT retraction resulting in trace vallecular residue, reduced hyolaryngeal elevation and excursion resulting in trace vallecular and pyriform residue, and reduced pharyngeal constriction resulting  in posterior pharyngeal wall residue.  Recommend that pt begin small snacks of Dysphagia 1 (puree) solids and honey-thick liquid with strict adherence to the following precautions: 1) PMV must be worn with all po intake 2) Small bites/sips 3) Sit upright 90 degrees 4) Slow rate of intake 5) 2-3 dry swallows after each bite/sip 6) Remain upright for 30+ minutes following po intake 7) Clear throat intermittently.  Additionally recommend that pt resume home health or outpatient speech therapy targeting dysphagia.  Pt may have small ice chips PRN in between meals following thorough oral care.  Recommend that pt continue to use PEG tube feedings as his main source of nutrition at this time.>>  There were no vitals filed for this visit.  Subjective Assessment - 05/28/19 1249    Subjective  "I eat once a day in the evening."    Patient is accompained by:  Family member    Currently in Pain?  No/denies        Prior Functional Status - 05/28/19 1250      Prior Functional Status   Cognitive/Linguistic Baseline  Within functional limits    Type of Home  House     Lives With  Spouse    Available Help at  Discharge  Family;Available 24 hours/day;Available PRN/intermittently      HPI: Logan Price is a 74 y.o. 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 He underwent biopsy via bronchoscopy on 2/2 and again on 2/3 of his left lingular lung mass ( 2/2)  , and also right anterior lung mass biopsy ( 2/3) Pathology has revealed that these the right upper lobe lesion is a new primary Squamous cell carcinoma of the lung, while the left lingular mass is positive for Focal atypical glands suspicious for adenocarcinoma. Pt completed MBS on 10/29/18 with recommendations for NPO with small ice chips as tolerated.  Pt reported that he began radiation on 10/22/18 and finished on 12/10/18. Last MBSS was  completed 03/26/19 with recommendation for puree and HTL with primary nutrition via PEG. He has continuous tube feeds via PEG from 8 AM to 4 PM. He is referred by [name], FNP for ongoing dysphagia intervention following discharge from [facility] ([facility] with Evert Kohl, SLP).   General - 05/28/19 1251      General Information   Type of Study  Bedside Swallow Evaluation    Diet Prior to this Study  Dysphagia 1 (puree);Honey-thick liquids    Temperature Spikes Noted  No    Respiratory Status  Room air    History of Recent Intubation  No    Behavior/Cognition  Alert;Cooperative;Pleasant mood    Oral Cavity Assessment  Within Functional Limits    Oral Care Completed by SLP  No    Oral Cavity - Dentition  Missing dentition;Adequate natural dentition    Vision  Functional for self-feeding    Self-Feeding Abilities  Able to feed self    Patient Positioning  Upright in chair    Baseline Vocal Quality  Normal    Volitional Cough  Strong    Volitional Swallow  Able to elicit       Oral Motor/Sensory Function - 05/28/19 1252      Oral Motor/Sensory Function   Overall Oral Motor/Sensory Function  Within functional limits      Ice Chips - 05/28/19 1252      Ice Chips   Ice chips  Not tested      Thin Liquid - 05/28/19 1253      Thin Liquid   Thin Liquid  Not tested        Puree - 05/28/19 1253      Puree   Puree  Impaired    Presentation  Spoon    Pharyngeal Phase Impairments  Decreased hyoid-laryngeal movement;Multiple swallows;Cough - Delayed;Throat Clearing - Delayed      Solid - 05/28/19 1253      Solid   Solid  Not tested         SLP Short Term Goals - 05/28/19 1827      SLP SHORT TERM GOAL #1   Title  pt will demo understanding of proper HEP procedure with occasional min A    Baseline  Introduced plan for HEP with Pt and spouse    Time  4    Period  Weeks    Status  On-going    Target Date  07/01/19      SLP SHORT TERM GOAL #2   Title  Pt  will swallow a minimum of 35 swallows per session with cues from SLP through exercises and/or po trials    Baseline  10  during today's visit    Time  4    Period  Weeks    Status  On-going    Target Date  07/01/19      SLP SHORT TERM GOAL #3   Title  Pt will participate in completion of MBSS after 2+ weeks of consuming 8-12 oz of puree/HTL per day at home as evidenced by completion of food log.    Baseline  Pt only consuming 4 oz po per day    Time  4    Period  Weeks    Status  On-going    Target Date  07/01/19       SLP Long Term Goals - 05/28/19 1827      SLP LONG TERM GOAL #1   Title  Pt will consume soft diet and thin liquids with use of strategies    Baseline  Primary nutrition is via PEG; limited oral intake of puree/HTL at this time    Time  2    Period  Months    Status  On-going        Plan - 05/28/19 1255    Clinical Impression Statement  Pt presents with known pharyngeal dysphagia in setting of s/p radiation therapy for squamous cell carcinoma of the glottis and continued trach with PMSV. Pt last had MBSS at the end of December 2020 and has been consuming trials of puree/HTL one time per day. Primary nutrition continues to be via PEG. SLP explained the importance of consuming po trials several times per day in order to eventually have PEG removed and to facilitate pharyngeal swallow. Recommend dysphagia therapy 2x per week with a focus on increasing po. Pt and wife appear motivated. Will arrange MBSS to be completed after radiation therapy for lung CA.   Speech Therapy Frequency  2x / week    Duration  4 weeks    Treatment/Interventions  Trials of upgraded texture/liquids;Compensatory strategies;Cueing hierarchy;Patient/family education;Pharyngeal strengthening exercises;Diet toleration management by SLP;SLP instruction and feedback   MBSS   Potential to Achieve Goals  Fair    Potential Considerations  Severity of impairments    SLP Home Exercise Plan  Pt will  completed HEP as assigned to facilitate carryover of treatment strategies in home environment    Consulted and Agree with Plan of Care  Patient;Family member/caregiver    Family Member Consulted  spouse       Patient will benefit from skilled therapeutic intervention in order to improve the following deficits and impairments:   Dysphagia, unspecified type    Problem List Patient Active Problem List   Diagnosis Date Noted  . Malignant neoplasm of right upper lobe of lung (HCC) 05/27/2019  . Primary cancer of left upper lobe of lung (HCC) 05/27/2019  . Pseudomonas aeruginosa infection 05/21/2019  . Tracheobronchitis 05/21/2019  . Hx of radiation therapy 05/21/2019  . 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HCC) 05/21/2019  . Squamous cell lung cancer, right (HCC) 05/21/2019  . Adenocarcinoma, lung, left (HCC) 05/21/2019  . Lung nodule 04/23/2019  . Ground glass opacity present on imaging of lung 04/23/2019  . Head and neck cancer (HCC) 04/23/2019  . Tracheostomy tube present (HCC) 04/23/2019  . PEG (percutaneous endoscopic gastrostomy) adjustment/replacement/removal (HCC) 03/08/2019  . 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 Pleural effusion   . 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HCC)   . Bacteremia   . Leukocytosis   . Normocytic anemia   . Protein-calorie malnutrition, severe 10/08/2018  . PSVT (paroxysmal supraventricular tachycardia) (HCC) 10/03/2018  .  Atrial fibrillation (HCC) 10/03/2018  . Goals of care, counseling/discussion   . Dysphagia   . Loss of weight   . 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 Gastritis determined by endoscopy   . Esophageal dysphagia   . 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 Globus sensation 12/12/2016  . Smokes with greater than 40 pack year history 10/20/2016  . Hemorrhoids, internal, with bleeding 10/06/2015  . History of adenomatous polyp of colon 04/14/2015  . Vitamin D deficiency 10/09/2014  . HLD (hyperlipidemia)  10/08/2012  . GERD (gastroesophageal reflux disease) 10/08/2012  . HTN (hypertension)   . Diverticulitis 10/25/2009   Thank you,  Havery Moros, CCC-SLP [phone]  [facility] 05/28/2019, 12:57 PM  [facility] [name] [place], [place], [address] Phone: [phone]   Fax:  [phone]  Name: Logan Price MRN: [id number] Date of Birth: 1946/01/24

## 2019-06-02 ENCOUNTER — Encounter (HOSPITAL_COMMUNITY): Payer: Self-pay | Admitting: Speech Pathology

## 2019-06-02 ENCOUNTER — Other Ambulatory Visit: Payer: Self-pay

## 2019-06-02 ENCOUNTER — Ambulatory Visit (HOSPITAL_COMMUNITY): Payer: Medicare Other | Admitting: Speech Pathology

## 2019-06-02 DIAGNOSIS — R1313 Dysphagia, pharyngeal phase: Secondary | ICD-10-CM | POA: Diagnosis not present

## 2019-06-02 DIAGNOSIS — R1310 Dysphagia, unspecified: Secondary | ICD-10-CM | POA: Diagnosis not present

## 2019-06-02 NOTE — Therapy (Signed)
lung, left ([place]) 05/21/2019  . Lung nodule 04/23/2019  . Ground glass opacity present on imaging of lung 04/23/2019  . Head and neck cancer ([place]) 04/23/2019  . Tracheostomy tube present ([place])  04/23/2019  . PEG (percutaneous endoscopic gastrostomy) adjustment/replacement/removal ([place]) 03/08/2019  . 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 Pleural effusion   . 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[place])   . Bacteremia   . Leukocytosis   . Normocytic anemia   . Protein-calorie malnutrition, severe 10/08/2018  . PSVT (paroxysmal supraventricular tachycardia) ([place]) 10/03/2018  . Atrial fibrillation ([place]) 10/03/2018  . Goals of care, counseling/discussion   . Dysphagia   . Loss of weight   . 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 Gastritis determined by endoscopy   . Esophageal dysphagia   . 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 Globus sensation 12/12/2016  . Smokes with greater than 40 pack year history 10/20/2016  . Hemorrhoids, internal, with bleeding 10/06/2015  . History of adenomatous polyp of colon 04/14/2015  . Vitamin D deficiency 10/09/2014  . HLD (hyperlipidemia) 10/08/2012  . GERD (gastroesophageal reflux disease) 10/08/2012  . HTN (hypertension)   . Diverticulitis 10/25/2009   Thank you,  Genene Churn, [place]  [facility] 06/02/2019, 6:33 PM  [place] [address] [place], [name], [address] Phone: [phone]   Fax:  [phone]   Name: Logan Price MRN: [id number] Date of Birth: [birth year]  lung, left ([place]) 05/21/2019  . Lung nodule 04/23/2019  . Ground glass opacity present on imaging of lung 04/23/2019  . Head and neck cancer ([place]) 04/23/2019  . Tracheostomy tube present ([place])  04/23/2019  . PEG (percutaneous endoscopic gastrostomy) adjustment/replacement/removal ([place]) 03/08/2019  . 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 Pleural effusion   . 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[place])   . Bacteremia   . Leukocytosis   . Normocytic anemia   . Protein-calorie malnutrition, severe 10/08/2018  . PSVT (paroxysmal supraventricular tachycardia) ([place]) 10/03/2018  . Atrial fibrillation ([place]) 10/03/2018  . Goals of care, counseling/discussion   . Dysphagia   . Loss of weight   . 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 Gastritis determined by endoscopy   . Esophageal dysphagia   . 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 Globus sensation 12/12/2016  . Smokes with greater than 40 pack year history 10/20/2016  . Hemorrhoids, internal, with bleeding 10/06/2015  . History of adenomatous polyp of colon 04/14/2015  . Vitamin D deficiency 10/09/2014  . HLD (hyperlipidemia) 10/08/2012  . GERD (gastroesophageal reflux disease) 10/08/2012  . HTN (hypertension)   . Diverticulitis 10/25/2009   Thank you,  Genene Churn, [place]  [facility] 06/02/2019, 6:33 PM  [place] [address] [place], [name], [address] Phone: [phone]   Fax:  [phone]   Name: Logan Price MRN: [id number] Date of Birth: [birth year]  lung, left ([place]) 05/21/2019  . Lung nodule 04/23/2019  . Ground glass opacity present on imaging of lung 04/23/2019  . Head and neck cancer ([place]) 04/23/2019  . Tracheostomy tube present ([place])  04/23/2019  . PEG (percutaneous endoscopic gastrostomy) adjustment/replacement/removal ([place]) 03/08/2019  . 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 Pleural effusion   . 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[place])   . Bacteremia   . Leukocytosis   . Normocytic anemia   . Protein-calorie malnutrition, severe 10/08/2018  . PSVT (paroxysmal supraventricular tachycardia) ([place]) 10/03/2018  . Atrial fibrillation ([place]) 10/03/2018  . Goals of care, counseling/discussion   . Dysphagia   . Loss of weight   . 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 Gastritis determined by endoscopy   . Esophageal dysphagia   . 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 Globus sensation 12/12/2016  . Smokes with greater than 40 pack year history 10/20/2016  . Hemorrhoids, internal, with bleeding 10/06/2015  . History of adenomatous polyp of colon 04/14/2015  . Vitamin D deficiency 10/09/2014  . HLD (hyperlipidemia) 10/08/2012  . GERD (gastroesophageal reflux disease) 10/08/2012  . HTN (hypertension)   . Diverticulitis 10/25/2009   Thank you,  Genene Churn, [place]  [facility] 06/02/2019, 6:33 PM  [place] [address] [place], [name], [address] Phone: [phone]   Fax:  [phone]   Name: Logan Price MRN: [id number] Date of Birth: [birth year]  [place], [name], [address] Phone: [phone]   Fax:  [phone]  Speech Language Pathology Treatment  Patient Details  Name: ALPHONSO GREGSON MRN: [id number] Date of Birth: 01-23-1946 Referring Provider (SLP): Eppie Gibson, MD   Encounter Date: 06/02/2019  End of Session - 06/02/19 1524    Visit Number  2    Number of Visits  9    Date for SLP Re-Evaluation  07/02/19    Authorization Type  Medicare    SLP Start Time  0945    SLP Stop Time   1030    SLP Time Calculation (min)  45 min    Activity Tolerance  Patient tolerated treatment well       Past Medical History:  Diagnosis Date  . Diverticulitis   . Dysrhythmia 09/2018   episode of SVT while in hospital  . 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 GERD (gastroesophageal reflux disease)   . 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 HTN (hypertension)   . Hyperlipidemia     Past Surgical History:  Procedure Laterality Date  . BIOPSY  05/04/2015   Procedure: BIOPSY;  Surgeon: Danie Binder, MD;  [facility];  Service: Endoscopy;;  bx's of ileocecal valve   . BRONCHIAL BRUSHINGS  04/29/2019   Procedure: BRONCHIAL BRUSHINGS;  Surgeon: Garner Nash, DO;  Location: [place];  Service: Pulmonary;;  . BRONCHIAL NEEDLE ASPIRATION BIOPSY  04/29/2019   Procedure: BRONCHIAL NEEDLE ASPIRATION BIOPSIES;  Surgeon: Garner Nash, DO;  Location: [place];  Service: Pulmonary;;  . BRONCHIAL WASHINGS  04/29/2019   Procedure: BRONCHIAL WASHINGS;  Surgeon: Garner Nash, DO;  [facility];  Service: Pulmonary;;  . COLONOSCOPY WITH PROPOFOL N/A 05/04/2015   Dr. Oneida Alar: normal appearing ileum with prominent IC valve with tubular adenomas, moderate diverticulosis in sigmoid colon, ascending colon, and retum. Moderate sized internal hemorrhoids. Surveillance in 5 years  . ELECTROMAGNETIC NAVIGATION BROCHOSCOPY  04/29/2019   Procedure:  NAVIGATION BRONCHOSCOPY;  Surgeon: Garner Nash, DO;  Location: [facility];  Service: Pulmonary;;  . ESOPHAGOGASTRODUODENOSCOPY (EGD) WITH PROPOFOL N/A 12/19/2016   Procedure: ESOPHAGOGASTRODUODENOSCOPY (EGD) WITH PROPOFOL;  Surgeon: Danie Binder, MD;  [facility];  Service: Endoscopy;  Laterality: N/A;  11:30am  . FLEXIBLE SIGMOIDOSCOPY N/A 12/10/2015   hemorrhoid banding X 3   . HEMORRHOID BANDING N/A 12/10/2015   Procedure: HEMORRHOID BANDING;  Surgeon: Danie Binder, MD;  [facility];  Service: Endoscopy;  Laterality: N/A;  1:30 PM  . INSERTION OF SUPRAPUBIC CATHETER N/A 02/24/2019   Procedure: INSERTION OF SUPRAPUBIC CATHETER;  Surgeon: Kathie Rhodes, MD;  [facility];  Service: Urology;  Laterality: N/A;  . IR CM INJ ANY COLONIC TUBE W/FLUORO  10/30/2018  . IR GASTROSTOMY TUBE MOD SED  10/04/2018  . IR IMAGING GUIDED PORT INSERTION  10/04/2018  . IR REMOVAL TUN ACCESS W/ PORT W/O FL MOD SED  10/14/2018  . IR REPLACE G-TUBE SIMPLE WO FLUORO  11/03/2018  . LAPAROSCOPIC INSERTION GASTROSTOMY TUBE Left 10/07/2018   Procedure: LAPAROSCOPIC  GASTROSTOMY TUBE;  Surgeon: Kinsinger, Arta Bruce, MD;  Location: [place];  Service: General;  Laterality: Left;  Marland Kitchen MICROLARYNGOSCOPY N/A 09/27/2018   Procedure: MICRO DIRECT LARYNGOSCOPY WITH BIOPSY;  Surgeon: Leta Baptist, MD;  Location: [facility];  Service: ENT;  Laterality: N/A;  . None to date     As of 04/14/15  .

## 2019-06-03 ENCOUNTER — Other Ambulatory Visit: Payer: Self-pay

## 2019-06-03 ENCOUNTER — Ambulatory Visit
Admission: RE | Admit: 2019-06-03 | Discharge: 2019-06-03 | Disposition: A | Discharge status: Home / Self Care | Payer: Medicare Other | Source: Ambulatory Visit | Attending: Radiation Oncology | Admitting: Radiation Oncology

## 2019-06-03 DIAGNOSIS — Z510 Encounter for antineoplastic radiation therapy: Secondary | ICD-10-CM | POA: Diagnosis not present

## 2019-06-03 DIAGNOSIS — C3412 Malignant neoplasm of upper lobe, left bronchus or lung: Secondary | ICD-10-CM | POA: Diagnosis not present

## 2019-06-03 DIAGNOSIS — C3411 Malignant neoplasm of upper lobe, right bronchus or lung: Secondary | ICD-10-CM | POA: Diagnosis not present

## 2019-06-04 ENCOUNTER — Encounter (HOSPITAL_COMMUNITY): Payer: Self-pay | Admitting: Speech Pathology

## 2019-06-04 ENCOUNTER — Ambulatory Visit (HOSPITAL_COMMUNITY): Payer: Medicare Other | Admitting: Speech Pathology

## 2019-06-04 DIAGNOSIS — R1310 Dysphagia, unspecified: Secondary | ICD-10-CM | POA: Diagnosis not present

## 2019-06-04 DIAGNOSIS — R1313 Dysphagia, pharyngeal phase: Secondary | ICD-10-CM | POA: Diagnosis not present

## 2019-06-04 NOTE — Therapy (Signed)
[place], [name], [address] Phone: [phone]   Fax:  [phone]  Speech Language Pathology Treatment  Patient Details  Name: Logan Price MRN: [id number] Date of Birth: November 23, 1945 Referring Provider (SLP): Eppie Gibson, MD   Encounter Date: 06/04/2019  End of Session - 06/04/19 1043    Visit Number  3    Number of Visits  9    Date for SLP Re-Evaluation  07/02/19    Authorization Type  Medicare    SLP Start Time  0945    SLP Stop Time   1030    SLP Time Calculation (min)  45 min    Activity Tolerance  Patient tolerated treatment well       Past Medical History:  Diagnosis Date  . Diverticulitis   . Dysrhythmia 09/2018   episode of SVT while in hospital  . 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 GERD (gastroesophageal reflux disease)   . 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 HTN (hypertension)   . Hyperlipidemia     Past Surgical History:  Procedure Laterality Date  . BIOPSY  05/04/2015   Procedure: BIOPSY;  Surgeon: Danie Binder, MD;  [facility];  Service: Endoscopy;;  bx's of ileocecal valve   . BRONCHIAL BRUSHINGS  04/29/2019   Procedure: BRONCHIAL BRUSHINGS;  Surgeon: Garner Nash, DO;  Location: [place];  Service: Pulmonary;;  . BRONCHIAL NEEDLE ASPIRATION BIOPSY  04/29/2019   Procedure: BRONCHIAL NEEDLE ASPIRATION BIOPSIES;  Surgeon: Garner Nash, DO;  Location: [place];  Service: Pulmonary;;  . BRONCHIAL WASHINGS  04/29/2019   Procedure: BRONCHIAL WASHINGS;  Surgeon: Garner Nash, DO;  [facility];  Service: Pulmonary;;  . COLONOSCOPY WITH PROPOFOL N/A 05/04/2015   Dr. Oneida Alar: normal appearing ileum with prominent IC valve with tubular adenomas, moderate diverticulosis in sigmoid colon, ascending colon, and retum. Moderate sized internal hemorrhoids. Surveillance in 5 years  . ELECTROMAGNETIC NAVIGATION BROCHOSCOPY  04/29/2019   Procedure: NAVIGATION BRONCHOSCOPY;  Surgeon: Garner Nash, DO;  Location: [facility];  Service: Pulmonary;;  . ESOPHAGOGASTRODUODENOSCOPY (EGD) WITH PROPOFOL N/A 12/19/2016   Procedure: ESOPHAGOGASTRODUODENOSCOPY (EGD) WITH PROPOFOL;  Surgeon: Danie Binder, MD;  [facility];  Service: Endoscopy;  Laterality: N/A;  11:30am  . FLEXIBLE SIGMOIDOSCOPY N/A 12/10/2015   hemorrhoid banding X 3   . HEMORRHOID BANDING N/A 12/10/2015   Procedure: HEMORRHOID BANDING;  Surgeon: Danie Binder, MD;  [facility];  Service: Endoscopy;  Laterality: N/A;  1:30 PM  . INSERTION OF SUPRAPUBIC CATHETER N/A 02/24/2019   Procedure: INSERTION OF SUPRAPUBIC CATHETER;  Surgeon: Kathie Rhodes, MD;  [facility];  Service: Urology;  Laterality: N/A;  . IR CM INJ ANY COLONIC TUBE W/FLUORO  10/30/2018  . IR GASTROSTOMY TUBE MOD SED  10/04/2018  . IR IMAGING GUIDED PORT INSERTION  10/04/2018  . IR REMOVAL TUN ACCESS W/ PORT W/O FL MOD SED  10/14/2018  . IR REPLACE G-TUBE SIMPLE WO FLUORO  11/03/2018  . LAPAROSCOPIC INSERTION GASTROSTOMY TUBE Left 10/07/2018   Procedure: LAPAROSCOPIC  GASTROSTOMY TUBE;  Surgeon: Kinsinger, Arta Bruce, MD;  Location: [place];  Service: General;  Laterality: Left;  Marland Kitchen MICROLARYNGOSCOPY N/A 09/27/2018   Procedure: MICRO DIRECT LARYNGOSCOPY WITH BIOPSY;  Surgeon: Leta Baptist, MD;  Location: [facility];  Service: ENT;  Laterality: N/A;  . None to date     As of 04/14/15  .  [place], [name], [address] Phone: [phone]   Fax:  [phone]  Speech Language Pathology Treatment  Patient Details  Name: Logan Price MRN: [id number] Date of Birth: November 23, 1945 Referring Provider (SLP): Eppie Gibson, MD   Encounter Date: 06/04/2019  End of Session - 06/04/19 1043    Visit Number  3    Number of Visits  9    Date for SLP Re-Evaluation  07/02/19    Authorization Type  Medicare    SLP Start Time  0945    SLP Stop Time   1030    SLP Time Calculation (min)  45 min    Activity Tolerance  Patient tolerated treatment well       Past Medical History:  Diagnosis Date  . Diverticulitis   . Dysrhythmia 09/2018   episode of SVT while in hospital  . 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 GERD (gastroesophageal reflux disease)   . 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 HTN (hypertension)   . Hyperlipidemia     Past Surgical History:  Procedure Laterality Date  . BIOPSY  05/04/2015   Procedure: BIOPSY;  Surgeon: Danie Binder, MD;  [facility];  Service: Endoscopy;;  bx's of ileocecal valve   . BRONCHIAL BRUSHINGS  04/29/2019   Procedure: BRONCHIAL BRUSHINGS;  Surgeon: Garner Nash, DO;  Location: [place];  Service: Pulmonary;;  . BRONCHIAL NEEDLE ASPIRATION BIOPSY  04/29/2019   Procedure: BRONCHIAL NEEDLE ASPIRATION BIOPSIES;  Surgeon: Garner Nash, DO;  Location: [place];  Service: Pulmonary;;  . BRONCHIAL WASHINGS  04/29/2019   Procedure: BRONCHIAL WASHINGS;  Surgeon: Garner Nash, DO;  [facility];  Service: Pulmonary;;  . COLONOSCOPY WITH PROPOFOL N/A 05/04/2015   Dr. Oneida Alar: normal appearing ileum with prominent IC valve with tubular adenomas, moderate diverticulosis in sigmoid colon, ascending colon, and retum. Moderate sized internal hemorrhoids. Surveillance in 5 years  . ELECTROMAGNETIC NAVIGATION BROCHOSCOPY  04/29/2019   Procedure: NAVIGATION BRONCHOSCOPY;  Surgeon: Garner Nash, DO;  Location: [facility];  Service: Pulmonary;;  . ESOPHAGOGASTRODUODENOSCOPY (EGD) WITH PROPOFOL N/A 12/19/2016   Procedure: ESOPHAGOGASTRODUODENOSCOPY (EGD) WITH PROPOFOL;  Surgeon: Danie Binder, MD;  [facility];  Service: Endoscopy;  Laterality: N/A;  11:30am  . FLEXIBLE SIGMOIDOSCOPY N/A 12/10/2015   hemorrhoid banding X 3   . HEMORRHOID BANDING N/A 12/10/2015   Procedure: HEMORRHOID BANDING;  Surgeon: Danie Binder, MD;  [facility];  Service: Endoscopy;  Laterality: N/A;  1:30 PM  . INSERTION OF SUPRAPUBIC CATHETER N/A 02/24/2019   Procedure: INSERTION OF SUPRAPUBIC CATHETER;  Surgeon: Kathie Rhodes, MD;  [facility];  Service: Urology;  Laterality: N/A;  . IR CM INJ ANY COLONIC TUBE W/FLUORO  10/30/2018  . IR GASTROSTOMY TUBE MOD SED  10/04/2018  . IR IMAGING GUIDED PORT INSERTION  10/04/2018  . IR REMOVAL TUN ACCESS W/ PORT W/O FL MOD SED  10/14/2018  . IR REPLACE G-TUBE SIMPLE WO FLUORO  11/03/2018  . LAPAROSCOPIC INSERTION GASTROSTOMY TUBE Left 10/07/2018   Procedure: LAPAROSCOPIC  GASTROSTOMY TUBE;  Surgeon: Kinsinger, Arta Bruce, MD;  Location: [place];  Service: General;  Laterality: Left;  Marland Kitchen MICROLARYNGOSCOPY N/A 09/27/2018   Procedure: MICRO DIRECT LARYNGOSCOPY WITH BIOPSY;  Surgeon: Leta Baptist, MD;  Location: [facility];  Service: ENT;  Laterality: N/A;  . None to date     As of 04/14/15  .  [place], [name], [address] Phone: [phone]   Fax:  [phone]  Speech Language Pathology Treatment  Patient Details  Name: Logan Price MRN: [id number] Date of Birth: November 23, 1945 Referring Provider (SLP): Eppie Gibson, MD   Encounter Date: 06/04/2019  End of Session - 06/04/19 1043    Visit Number  3    Number of Visits  9    Date for SLP Re-Evaluation  07/02/19    Authorization Type  Medicare    SLP Start Time  0945    SLP Stop Time   1030    SLP Time Calculation (min)  45 min    Activity Tolerance  Patient tolerated treatment well       Past Medical History:  Diagnosis Date  . Diverticulitis   . Dysrhythmia 09/2018   episode of SVT while in hospital  . 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 GERD (gastroesophageal reflux disease)   . 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 HTN (hypertension)   . Hyperlipidemia     Past Surgical History:  Procedure Laterality Date  . BIOPSY  05/04/2015   Procedure: BIOPSY;  Surgeon: Danie Binder, MD;  [facility];  Service: Endoscopy;;  bx's of ileocecal valve   . BRONCHIAL BRUSHINGS  04/29/2019   Procedure: BRONCHIAL BRUSHINGS;  Surgeon: Garner Nash, DO;  Location: [place];  Service: Pulmonary;;  . BRONCHIAL NEEDLE ASPIRATION BIOPSY  04/29/2019   Procedure: BRONCHIAL NEEDLE ASPIRATION BIOPSIES;  Surgeon: Garner Nash, DO;  Location: [place];  Service: Pulmonary;;  . BRONCHIAL WASHINGS  04/29/2019   Procedure: BRONCHIAL WASHINGS;  Surgeon: Garner Nash, DO;  [facility];  Service: Pulmonary;;  . COLONOSCOPY WITH PROPOFOL N/A 05/04/2015   Dr. Oneida Alar: normal appearing ileum with prominent IC valve with tubular adenomas, moderate diverticulosis in sigmoid colon, ascending colon, and retum. Moderate sized internal hemorrhoids. Surveillance in 5 years  . ELECTROMAGNETIC NAVIGATION BROCHOSCOPY  04/29/2019   Procedure: NAVIGATION BRONCHOSCOPY;  Surgeon: Garner Nash, DO;  Location: [facility];  Service: Pulmonary;;  . ESOPHAGOGASTRODUODENOSCOPY (EGD) WITH PROPOFOL N/A 12/19/2016   Procedure: ESOPHAGOGASTRODUODENOSCOPY (EGD) WITH PROPOFOL;  Surgeon: Danie Binder, MD;  [facility];  Service: Endoscopy;  Laterality: N/A;  11:30am  . FLEXIBLE SIGMOIDOSCOPY N/A 12/10/2015   hemorrhoid banding X 3   . HEMORRHOID BANDING N/A 12/10/2015   Procedure: HEMORRHOID BANDING;  Surgeon: Danie Binder, MD;  [facility];  Service: Endoscopy;  Laterality: N/A;  1:30 PM  . INSERTION OF SUPRAPUBIC CATHETER N/A 02/24/2019   Procedure: INSERTION OF SUPRAPUBIC CATHETER;  Surgeon: Kathie Rhodes, MD;  [facility];  Service: Urology;  Laterality: N/A;  . IR CM INJ ANY COLONIC TUBE W/FLUORO  10/30/2018  . IR GASTROSTOMY TUBE MOD SED  10/04/2018  . IR IMAGING GUIDED PORT INSERTION  10/04/2018  . IR REMOVAL TUN ACCESS W/ PORT W/O FL MOD SED  10/14/2018  . IR REPLACE G-TUBE SIMPLE WO FLUORO  11/03/2018  . LAPAROSCOPIC INSERTION GASTROSTOMY TUBE Left 10/07/2018   Procedure: LAPAROSCOPIC  GASTROSTOMY TUBE;  Surgeon: Kinsinger, Arta Bruce, MD;  Location: [place];  Service: General;  Laterality: Left;  Marland Kitchen MICROLARYNGOSCOPY N/A 09/27/2018   Procedure: MICRO DIRECT LARYNGOSCOPY WITH BIOPSY;  Surgeon: Leta Baptist, MD;  Location: [facility];  Service: ENT;  Laterality: N/A;  . None to date     As of 04/14/15  .  Primary cancer of left upper lobe of lung ([place]) 05/27/2019  . Pseudomonas aeruginosa infection 05/21/2019  . Tracheobronchitis 05/21/2019  . Hx of  radiation therapy 05/21/2019  . 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 Adenocarcinoma, lung, left ([place]) 05/21/2019  . Lung nodule 04/23/2019  . Ground glass opacity present on imaging of lung 04/23/2019  . Head and neck cancer ([place]) 04/23/2019  . Tracheostomy tube present ([place]) 04/23/2019  . PEG (percutaneous endoscopic gastrostomy) adjustment/replacement/removal ([place]) 03/08/2019  . 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 Pleural effusion   . 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[place])   . Bacteremia   . Leukocytosis   . Normocytic anemia   . Protein-calorie malnutrition, severe 10/08/2018  . PSVT (paroxysmal supraventricular tachycardia) ([place]) 10/03/2018  . Atrial fibrillation ([place]) 10/03/2018  . Goals of care, counseling/discussion   . Dysphagia   . Loss of weight   . 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 Gastritis determined by endoscopy   . Esophageal dysphagia   . 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 Globus sensation 12/12/2016  . Smokes with greater than 40 pack year history 10/20/2016  . Hemorrhoids, internal, with bleeding 10/06/2015  . History of adenomatous polyp of colon 04/14/2015  . Vitamin D deficiency 10/09/2014  . HLD (hyperlipidemia) 10/08/2012  . GERD (gastroesophageal reflux disease) 10/08/2012  . HTN (hypertension)   . Diverticulitis 10/25/2009   Thank you,  Genene Churn, [place]  [facility] 06/04/2019, 10:45 AM  [place], [name], [address] Phone: [phone]   Fax:  [phone]   Name: SAYRE WITHERINGTON MRN: [id number] Date of Birth: [birth year]

## 2019-06-05 ENCOUNTER — Ambulatory Visit
Admission: RE | Admit: 2019-06-05 | Discharge: 2019-06-05 | Disposition: A | Discharge status: Home / Self Care | Payer: Medicare Other | Source: Ambulatory Visit | Attending: Radiation Oncology | Admitting: Radiation Oncology

## 2019-06-05 ENCOUNTER — Other Ambulatory Visit: Payer: Self-pay

## 2019-06-05 DIAGNOSIS — C3412 Malignant neoplasm of upper lobe, left bronchus or lung: Secondary | ICD-10-CM | POA: Diagnosis not present

## 2019-06-05 DIAGNOSIS — C3411 Malignant neoplasm of upper lobe, right bronchus or lung: Secondary | ICD-10-CM | POA: Diagnosis not present

## 2019-06-05 DIAGNOSIS — Z510 Encounter for antineoplastic radiation therapy: Secondary | ICD-10-CM | POA: Diagnosis not present

## 2019-06-09 ENCOUNTER — Other Ambulatory Visit: Payer: Self-pay

## 2019-06-09 ENCOUNTER — Ambulatory Visit (HOSPITAL_COMMUNITY): Payer: Medicare Other | Admitting: Speech Pathology

## 2019-06-09 ENCOUNTER — Ambulatory Visit
Admission: RE | Admit: 2019-06-09 | Discharge: 2019-06-09 | Disposition: A | Discharge status: Home / Self Care | Payer: Medicare Other | Source: Ambulatory Visit | Attending: Radiation Oncology | Admitting: Radiation Oncology

## 2019-06-09 DIAGNOSIS — C3411 Malignant neoplasm of upper lobe, right bronchus or lung: Secondary | ICD-10-CM | POA: Diagnosis not present

## 2019-06-09 DIAGNOSIS — C3412 Malignant neoplasm of upper lobe, left bronchus or lung: Secondary | ICD-10-CM | POA: Diagnosis not present

## 2019-06-09 DIAGNOSIS — Z510 Encounter for antineoplastic radiation therapy: Secondary | ICD-10-CM | POA: Diagnosis not present

## 2019-06-10 ENCOUNTER — Ambulatory Visit: Payer: Medicare Other | Admitting: Radiation Oncology

## 2019-06-10 ENCOUNTER — Ambulatory Visit (HOSPITAL_COMMUNITY): Payer: Medicare Other | Admitting: Speech Pathology

## 2019-06-10 ENCOUNTER — Ambulatory Visit: Payer: Medicare Other | Attending: Internal Medicine

## 2019-06-10 DIAGNOSIS — Z23 Encounter for immunization: Secondary | ICD-10-CM

## 2019-06-10 NOTE — Progress Notes (Signed)
Covid-19 Vaccination Clinic  Name:  Logan Price    MRN: [id number] DOB: Aug 02, 1945  06/10/2019  Logan Price was observed post Covid-19 immunization for 15 minutes without incident. He was provided with Vaccine Information Sheet and instruction to access the V-Safe system.   Logan Price was instructed to call 911 with any severe reactions post vaccine: Marland Kitchen Difficulty breathing  . Swelling of face and throat  . A fast heartbeat  . A bad rash all over body  . Dizziness and weakness   Immunizations Administered    Name Date Dose VIS Date Route   Pfizer COVID-19 Vaccine 06/10/2019  1:55 PM 0.3 mL 03/07/2019 Intramuscular   Manufacturer: [employer], [employer]   Lot: [id number]   NDC: [address]-2

## 2019-06-11 ENCOUNTER — Ambulatory Visit
Admission: RE | Admit: 2019-06-11 | Discharge: 2019-06-11 | Disposition: A | Discharge status: Home / Self Care | Payer: Medicare Other | Source: Ambulatory Visit | Attending: Radiation Oncology | Admitting: Radiation Oncology

## 2019-06-11 ENCOUNTER — Ambulatory Visit (HOSPITAL_COMMUNITY): Payer: Medicare Other | Admitting: Speech Pathology

## 2019-06-11 ENCOUNTER — Other Ambulatory Visit: Payer: Self-pay

## 2019-06-11 DIAGNOSIS — C3412 Malignant neoplasm of upper lobe, left bronchus or lung: Secondary | ICD-10-CM | POA: Diagnosis not present

## 2019-06-11 DIAGNOSIS — C3411 Malignant neoplasm of upper lobe, right bronchus or lung: Secondary | ICD-10-CM | POA: Diagnosis not present

## 2019-06-11 DIAGNOSIS — Z510 Encounter for antineoplastic radiation therapy: Secondary | ICD-10-CM | POA: Diagnosis not present

## 2019-06-12 ENCOUNTER — Ambulatory Visit: Payer: Medicare Other | Admitting: Radiation Oncology

## 2019-06-12 ENCOUNTER — Ambulatory Visit (HOSPITAL_COMMUNITY): Payer: Medicare Other | Admitting: Speech Pathology

## 2019-06-13 ENCOUNTER — Encounter: Payer: Self-pay | Admitting: Radiation Oncology

## 2019-06-13 ENCOUNTER — Ambulatory Visit
Admission: RE | Admit: 2019-06-13 | Discharge: 2019-06-13 | Disposition: A | Discharge status: Home / Self Care | Payer: Medicare Other | Source: Ambulatory Visit | Attending: Radiation Oncology | Admitting: Radiation Oncology

## 2019-06-13 ENCOUNTER — Other Ambulatory Visit: Payer: Self-pay

## 2019-06-13 DIAGNOSIS — C3411 Malignant neoplasm of upper lobe, right bronchus or lung: Secondary | ICD-10-CM | POA: Diagnosis not present

## 2019-06-13 DIAGNOSIS — C3412 Malignant neoplasm of upper lobe, left bronchus or lung: Secondary | ICD-10-CM | POA: Diagnosis not present

## 2019-06-13 DIAGNOSIS — Z510 Encounter for antineoplastic radiation therapy: Secondary | ICD-10-CM | POA: Diagnosis not present

## 2019-06-16 ENCOUNTER — Encounter (HOSPITAL_COMMUNITY): Payer: Self-pay | Admitting: Speech Pathology

## 2019-06-16 ENCOUNTER — Other Ambulatory Visit: Payer: Self-pay

## 2019-06-16 ENCOUNTER — Ambulatory Visit (HOSPITAL_COMMUNITY): Payer: Medicare Other | Admitting: Speech Pathology

## 2019-06-16 DIAGNOSIS — R1310 Dysphagia, unspecified: Secondary | ICD-10-CM | POA: Diagnosis not present

## 2019-06-16 DIAGNOSIS — R1313 Dysphagia, pharyngeal phase: Secondary | ICD-10-CM | POA: Diagnosis not present

## 2019-06-16 NOTE — Therapy (Signed)
[facility] [address] [place], [place], [address] Phone: [phone]   Fax:  [phone]  Speech Language Pathology Treatment  Patient Details  Name: Logan Price MRN: [id number] Date of Birth: 11-21-1945 Referring Provider (SLP): Lonie Peak, MD   Encounter Date: 06/16/2019  End of Session - 06/16/19 1307    Visit Number  4    Number of Visits  9    Date for SLP Re-Evaluation  07/02/19    Authorization Type  Medicare    SLP Start Time  1144    SLP Stop Time   1230    SLP Time Calculation (min)  46 min    Activity Tolerance  Patient tolerated treatment well       Past Medical History:  Diagnosis Date  . Diverticulitis   . Dysrhythmia 09/2018   episode of SVT while in hospital  . 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 GERD (gastroesophageal reflux disease)   . 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 HTN (hypertension)   . Hyperlipidemia     Past Surgical History:  Procedure Laterality Date  . BIOPSY  05/04/2015   Procedure: BIOPSY;  Surgeon: West Bali, MD;  [facility];  Service: Endoscopy;;  bx's of ileocecal valve   . BRONCHIAL BRUSHINGS  04/29/2019   Procedure: BRONCHIAL BRUSHINGS;  Surgeon: Josephine Igo, DO;  Location: [facility];  Service: Pulmonary;;  . BRONCHIAL NEEDLE ASPIRATION BIOPSY  04/29/2019   Procedure: BRONCHIAL NEEDLE ASPIRATION BIOPSIES;  Surgeon: Josephine Igo, DO;  [facility]: [facility];  Service: Pulmonary;;  . BRONCHIAL WASHINGS  04/29/2019   Procedure: BRONCHIAL WASHINGS;  Surgeon: Josephine Igo, DO;  Location: [facility];  Service: Pulmonary;;  . COLONOSCOPY WITH PROPOFOL N/A 05/04/2015   Dr. Darrick Penna: normal appearing ileum with prominent IC valve with tubular adenomas, moderate diverticulosis in sigmoid colon, ascending colon, and retum. Moderate sized internal hemorrhoids. Surveillance in 5 years  . ELECTROMAGNETIC NAVIGATION BROCHOSCOPY  04/29/2019    Procedure: NAVIGATION BRONCHOSCOPY;  Surgeon: Josephine Igo, DO;  [facility]: [facility];  Service: Pulmonary;;  . ESOPHAGOGASTRODUODENOSCOPY (EGD) WITH PROPOFOL N/A 12/19/2016   Procedure: ESOPHAGOGASTRODUODENOSCOPY (EGD) WITH PROPOFOL;  Surgeon: West Bali, MD;  [facility];  Service: Endoscopy;  Laterality: N/A;  11:30am  . FLEXIBLE SIGMOIDOSCOPY N/A 12/10/2015   hemorrhoid banding X 3   . HEMORRHOID BANDING N/A 12/10/2015   Procedure: HEMORRHOID BANDING;  Surgeon: West Bali, MD;  [facility];  Service: Endoscopy;  Laterality: N/A;  1:30 PM  . INSERTION OF SUPRAPUBIC CATHETER N/A 02/24/2019   Procedure: INSERTION OF SUPRAPUBIC CATHETER;  Surgeon: Ihor Gully, MD;  Location: [facility];  Service: Urology;  Laterality: N/A;  . IR CM INJ ANY COLONIC TUBE W/FLUORO  10/30/2018  . IR GASTROSTOMY TUBE MOD SED  10/04/2018  . IR IMAGING GUIDED PORT INSERTION  10/04/2018  . IR REMOVAL TUN ACCESS W/ PORT W/O FL MOD SED  10/14/2018  . IR REPLACE G-TUBE SIMPLE WO FLUORO  11/03/2018  . LAPAROSCOPIC INSERTION GASTROSTOMY TUBE Left 10/07/2018   Procedure: LAPAROSCOPIC  GASTROSTOMY TUBE;  Surgeon: Kinsinger, De Blanch, MD;  Location: [facility];  Service: General;  Laterality: Left;  Marland Kitchen MICROLARYNGOSCOPY N/A 09/27/2018   Procedure: MICRO DIRECT LARYNGOSCOPY WITH BIOPSY;  Surgeon: Newman Pies, MD;  Location: [facility] OR;  Service: ENT;  Laterality: N/A;  . None to date     As of 04/14/15  .  POLYPECTOMY  05/04/2015   Procedure: POLYPECTOMY;  Surgeon: West Bali, MD;  [facility];  Service: Endoscopy;;  descending colon polyp, ascending colon polyp  . SAVORY DILATION N/A 12/19/2016   Procedure: SAVORY DILATION;  Surgeon: West Bali, MD;  [facility];  Service: Endoscopy;  Laterality: N/A;  . TRACHEOSTOMY TUBE PLACEMENT N/A 09/27/2018   Procedure: AWAKE TRACHEOSTOMY;  Surgeon: Newman Pies, MD;  [facility];  Service: ENT;  Laterality: N/A;  . TRANSURETHRAL RESECTION OF  PROSTATE N/A 02/24/2019   Procedure: TRANSURETHRAL RESECTION OF THE PROSTATE (TURP);  Surgeon: Ihor Gully, MD;  Location: [facility];  Service: Urology;  Laterality: N/A;  . VIDEO BRONCHOSCOPY WITH ENDOBRONCHIAL NAVIGATION N/A 04/29/2019   Procedure: VIDEO BRONCHOSCOPY;  Surgeon: Josephine Igo, DO;  [facility];  Service: Pulmonary;  Laterality: N/A;    There were no vitals filed for this visit.  Subjective Assessment - 06/16/19 1256    Subjective  "I use the suction frequently at home."    Currently in Pain?  No/denies         ADULT SLP TREATMENT - 06/16/19 1259      General Information   Behavior/Cognition  Alert;Cooperative;Pleasant mood    Patient Positioning  Upright in chair    Oral care provided  N/A    HPI  Logan Price is a 74 y.o. 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 He underwent biopsy via bronchoscopy on 2/2 and again on 2/3 of his left lingular lung mass ( 2/2)  , and also right anterior lung mass biopsy ( 2/3) Pathology has revealed that these the right upper lobe lesion is a new primary Squamous cell carcinoma of the lung, while the left lingular mass is positive for Focal atypical glands suspicious for adenocarcinoma. Pt completed MBS on 10/29/18 with recommendations for NPO with small ice chips as tolerated.  Pt reported that he began radiation on 10/22/18 and finished on 12/10/18. Last MBSS was completed 03/26/19 with recommendation for puree and HTL with primary nutrition via PEG. He has continuous tube feeds via PEG from 8 AM to 4 PM. He is referred by [name], FNP for ongoing dysphagia intervention following discharge from [facility] ([facility] with Evert Kohl, SLP).       Treatment Provided   Treatment provided  Dysphagia      Dysphagia Treatment   Temperature Spikes Noted  No    Respiratory Status  Trach    Oral Cavity -  Dentition  Adequate natural dentition    Treatment Methods  Skilled observation;Therapeutic exercise;Compensation strategy training;Patient/caregiver education    Patient observed directly with PO's  Yes    Type of PO's observed  Honey-thick liquids;Thin liquids    Feeding  Able to feed self    Liquids provided via  Cup    Pharyngeal Phase Signs & Symptoms  Multiple swallows;Delayed throat clear;Delayed cough;Complaints of residue    Type of cueing  Verbal    Amount of cueing  Minimal      Pain Assessment   Pain Assessment  No/denies pain      Assessment / Recommendations / Plan   Plan  Continue with current plan of care      Dysphagia Recommendations   Diet recommendations  Dysphagia 1 (puree);Honey-thick liquid    Medication Administration  Via alternative means    Supervision  Patient able to self feed    Compensations  Slow rate;Small sips/bites    Postural Changes and/or Swallow Maneuvers  Seated upright 90 degrees;Upright 30-60 min after meal      Progression Toward Goals   Progression toward goals  Progressing toward goals         SLP Short Term Goals - 06/16/19 1308      SLP SHORT TERM GOAL #1   Title  pt will demo understanding of proper HEP procedure with occasional min A    Baseline  Introduced plan for HEP with Pt and spouse    Time  4    Period  Weeks    Status  On-going    Target Date  07/01/19      SLP SHORT TERM GOAL #2   Title  Pt will swallow a minimum of 35 swallows per session with cues from SLP through exercises and/or po trials    Baseline  10 during today's visit    Time  4    Period  Weeks    Status  On-going    Target Date  07/01/19      SLP SHORT TERM GOAL #3   Title  Pt will participate in completion of MBSS after 2+ weeks of consuming 8-12 oz of puree/HTL per day at home as evidenced by completion of food log.    Baseline  Pt only consuming 4 oz po per day    Time  4    Period  Weeks    Status  On-going    Target Date  07/01/19        SLP Long Term Goals - 06/16/19 1308      SLP LONG TERM GOAL #1   Title  Pt will consume soft diet and thin liquids with use of strategies    Baseline  Primary nutrition is via PEG; limited oral intake of puree/HTL at this time    Time  2    Period  Months    Status  On-going       Plan - 06/16/19 1307    Clinical Impression Statement  Pt accompanied to the session by his wife and they brought in his po intake log with ~20 oz intake per day. He predominantly consumes HTL and was encouraged to try more pureed textures. He does not like applesauce. His wife is going to purchase some yogurt cups and tubes. In session, he consumed 4 oz HTL apple juice and 2 oz thin water via cup sip with reminders to swallow fast and hard. He needed reminders to repeat/dry swallow after clearing his throat or coughing. Pt with less coughing during trials today, however increased coughing noted following completion of 4oz. He completed 65 swallows this session between po intake and pharyngeal swallowing exercises. He will se Alphonse Guild, RD tomorrow and he was reminded to bring his food journal to his appointment. SLP sent a message to Dr. Basilio Cairo and other members of the team requesting order for MBSS to be completed next week. Pt encouraged to continue with po intake at home and increasing puree textures.    Speech Therapy Frequency  2x / week    Duration  4 weeks    Treatment/Interventions  Trials of upgraded texture/liquids;Compensatory strategies;Cueing hierarchy;Patient/family education;Pharyngeal strengthening exercises;Diet toleration management by SLP;SLP instruction and feedback   MBSS   Potential to Achieve Goals  Fair    Potential Considerations  Severity of impairments    SLP Home Exercise Plan  Pt  will completed HEP as assigned to facilitate carryover of treatment strategies in home environment    Consulted and Agree with Plan of Care  Patient;Family member/caregiver    Family Member Consulted   spouse       Patient will benefit from skilled therapeutic intervention in order to improve the following deficits and impairments:   Dysphagia, unspecified type    Problem List Patient Active Problem List   Diagnosis Date Noted  . Malignant neoplasm of right upper lobe of lung (HCC) 05/27/2019  . Primary cancer of left upper lobe of lung (HCC) 05/27/2019  . Pseudomonas aeruginosa infection 05/21/2019  . Tracheobronchitis 05/21/2019  . Hx of radiation therapy 05/21/2019  . 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HCC) 05/21/2019  . Squamous cell lung cancer, right (HCC) 05/21/2019  . Adenocarcinoma, lung, left (HCC) 05/21/2019  . Lung nodule 04/23/2019  . Ground glass opacity present on imaging of lung 04/23/2019  . Head and neck cancer (HCC) 04/23/2019  . Tracheostomy tube present (HCC) 04/23/2019  . PEG (percutaneous endoscopic gastrostomy) adjustment/replacement/removal (HCC) 03/08/2019  . 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 Pleural effusion   . 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HCC)   . Bacteremia   . Leukocytosis   . Normocytic anemia   . Protein-calorie malnutrition, severe 10/08/2018  . PSVT (paroxysmal supraventricular tachycardia) (HCC) 10/03/2018  . Atrial fibrillation (HCC) 10/03/2018  . Goals of care, counseling/discussion   . Dysphagia   . Loss of weight   . 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 Gastritis determined by endoscopy   . Esophageal dysphagia   . 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 Globus sensation 12/12/2016  . Smokes with greater than 40 pack year history 10/20/2016  . Hemorrhoids, internal, with bleeding 10/06/2015  . History of adenomatous polyp of colon 04/14/2015  . Vitamin D deficiency 10/09/2014  . HLD (hyperlipidemia) 10/08/2012  . GERD (gastroesophageal reflux disease) 10/08/2012  . HTN (hypertension)   . Diverticulitis 10/25/2009   Thank you,  Havery Moros, CCC-SLP [phone]  Shauna Bodkins 06/16/2019,  1:10 PM  [facility] [address] [place], [place], [address] Phone: [phone]   Fax:  [phone]   Name: Logan Price MRN: [id number] Date of Birth: 23-Nov-1945

## 2019-06-17 ENCOUNTER — Other Ambulatory Visit: Payer: Self-pay | Admitting: Radiation Oncology

## 2019-06-17 ENCOUNTER — Inpatient Hospital Stay: Payer: Medicare Other | Attending: Hematology

## 2019-06-17 ENCOUNTER — Other Ambulatory Visit: Payer: Self-pay

## 2019-06-17 DIAGNOSIS — C3411 Malignant neoplasm of upper lobe, right bronchus or lung: Secondary | ICD-10-CM

## 2019-06-17 DIAGNOSIS — C320 Malignant neoplasm of glottis: Secondary | ICD-10-CM

## 2019-06-17 NOTE — Progress Notes (Addendum)
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  Patient received PEG in July 2020.  Patient has completed radiation for bilateral lung nodules.  Noted planning MBSS on 3/29.    Met with patient today in clinic.  Patient brought in food diary from working with SLP.  Patient consuming honey thick ensure original 8 oz typically every am.  Has been eating 10 1/2 oz of variety of cream soups and 4 oz of ensure for dinner in the evening.  Will also have pudding, jello thickened juice between am ensure and evening soup.  Patient reports that he has not tried more foods due to not having a blender.  Reports taking 3 cartons of [employer] peptide 1.5 via pump from 8am-4pm at rate of 12m/hr.  Patient reports that he was able to tolerate that well.  Reports that he feels full.   Medications: reviewed  Labs: no new  Anthropometrics:   Weight today in clinic 149 lb (jacket off) increased from 148 lb 4 oz on 05/20/19 143 lb 6 oz on 1/26  Estimated Energy Needs  Kcals:2300-2500  Protein: 115-130 g Fluid: > 2 L  NUTRITION DIAGNOSIS: Inadequate oral intake improving   INTERVENTION:  Discussed with patient option of giving tube feeding via bolus method vs using pump but patient not interested. States that he wants to stay on the tube feeding pump.  Patient willing to reduce tube feeding to 2 cartons of [employer] 1.5 peptide per day via pump at rate of 1620mhr for 4 hours (8am-noon).  Patient not interested in running tube feeding in the evening or late at night to allow him to eat more during the day.  Reports that he wants to run tube feeding in the am and eat later on during the day.   Prostat 3015mID to continue for additional protein.  Discussed importance of increasing oral intake with tapering down tube feeding.  Recommend patient add snack BID, currently eating about 2 times per day.  Patient has a list of soft foods to choose from.  He identified several items  on the list that he liked.   Encouraged patient to use 350 calorie oral nutrition supplements (thickened) for more calories.  Samples given.  Patient had asked RD to call [facility] regarding change in tube feeding order.  Spoke with [facility] to discuss tapering down tube feeding order which will dependent on oral intake and weight.   Patient has contact information     MONITORING, EVALUATION, GOAL: Patient will tolerate oral foods and taper tube feeding all while maintaining weight   NEXT VISIT: 1 month Will send message to scheduling to arrange follow-up in 4 weeks to check weight and arrange transportation.   Hershal Eriksson B. AllZenia ResidesD,WestonDNMagdalenagistered Dietitian [phone])

## 2019-06-18 ENCOUNTER — Telehealth: Payer: Self-pay

## 2019-06-18 ENCOUNTER — Ambulatory Visit (HOSPITAL_COMMUNITY): Payer: Medicare Other | Admitting: Speech Pathology

## 2019-06-18 ENCOUNTER — Encounter (HOSPITAL_COMMUNITY): Payer: Self-pay | Admitting: Speech Pathology

## 2019-06-18 ENCOUNTER — Other Ambulatory Visit (HOSPITAL_COMMUNITY): Payer: Self-pay | Admitting: Specialist

## 2019-06-18 DIAGNOSIS — C320 Malignant neoplasm of glottis: Secondary | ICD-10-CM

## 2019-06-18 DIAGNOSIS — R1310 Dysphagia, unspecified: Secondary | ICD-10-CM | POA: Diagnosis not present

## 2019-06-18 DIAGNOSIS — R1313 Dysphagia, pharyngeal phase: Secondary | ICD-10-CM | POA: Diagnosis not present

## 2019-06-18 DIAGNOSIS — R1319 Other dysphagia: Secondary | ICD-10-CM

## 2019-06-18 NOTE — Therapy (Signed)
[place], [name], [address] Phone: [phone]   Fax:  [phone]  Speech Language Pathology Treatment  Patient Details  Name: Logan Price MRN: [id number] Date of Birth: 01/04/1946 Referring Provider (SLP): Eppie Gibson, MD   Encounter Date: 06/18/2019  End of Session - 06/18/19 1058    Visit Number  5    Number of Visits  9    Date for SLP Re-Evaluation  07/02/19    Authorization Type  Medicare    SLP Start Time  0950    SLP Stop Time   1030    SLP Time Calculation (min)  40 min    Activity Tolerance  Patient tolerated treatment well       Past Medical History:  Diagnosis Date  . Diverticulitis   . Dysrhythmia 09/2018   episode of SVT while in hospital  . 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 GERD (gastroesophageal reflux disease)   . 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 HTN (hypertension)   . Hyperlipidemia     Past Surgical History:  Procedure Laterality Date  . BIOPSY  05/04/2015   Procedure: BIOPSY;  Surgeon: Danie Binder, MD;  [facility];  Service: Endoscopy;;  bx's of ileocecal valve   . BRONCHIAL BRUSHINGS  04/29/2019   Procedure: BRONCHIAL BRUSHINGS;  Surgeon: Garner Nash, DO;  Location: [facility];  Service: Pulmonary;;  . BRONCHIAL NEEDLE ASPIRATION BIOPSY  04/29/2019   Procedure: BRONCHIAL NEEDLE ASPIRATION BIOPSIES;  Surgeon: Garner Nash, DO;  Location: [facility];  Service: Pulmonary;;  . BRONCHIAL WASHINGS  04/29/2019   Procedure: BRONCHIAL WASHINGS;  Surgeon: Garner Nash, DO;  Location: [facility];  Service: Pulmonary;;  . COLONOSCOPY WITH PROPOFOL N/A 05/04/2015   Dr. Oneida Alar: normal appearing ileum with prominent IC valve with tubular adenomas, moderate diverticulosis in sigmoid colon, ascending colon, and retum. Moderate sized internal hemorrhoids. Surveillance in 5 years  . ELECTROMAGNETIC NAVIGATION BROCHOSCOPY  04/29/2019    Procedure: NAVIGATION BRONCHOSCOPY;  Surgeon: Garner Nash, DO;  Location: [facility];  Service: Pulmonary;;  . ESOPHAGOGASTRODUODENOSCOPY (EGD) WITH PROPOFOL N/A 12/19/2016   Procedure: ESOPHAGOGASTRODUODENOSCOPY (EGD) WITH PROPOFOL;  Surgeon: Danie Binder, MD;  [facility];  Service: Endoscopy;  Laterality: N/A;  11:30am  . FLEXIBLE SIGMOIDOSCOPY N/A 12/10/2015   hemorrhoid banding X 3   . HEMORRHOID BANDING N/A 12/10/2015   Procedure: HEMORRHOID BANDING;  Surgeon: Danie Binder, MD;  [facility];  Service: Endoscopy;  Laterality: N/A;  1:30 PM  . INSERTION OF SUPRAPUBIC CATHETER N/A 02/24/2019   Procedure: INSERTION OF SUPRAPUBIC CATHETER;  Surgeon: Kathie Rhodes, MD;  Location: [facility];  Service: Urology;  Laterality: N/A;  . IR CM INJ ANY COLONIC TUBE W/FLUORO  10/30/2018  . IR GASTROSTOMY TUBE MOD SED  10/04/2018  . IR IMAGING GUIDED PORT INSERTION  10/04/2018  . IR REMOVAL TUN ACCESS W/ PORT W/O FL MOD SED  10/14/2018  . IR REPLACE G-TUBE SIMPLE WO FLUORO  11/03/2018  . LAPAROSCOPIC INSERTION GASTROSTOMY TUBE Left 10/07/2018   Procedure: LAPAROSCOPIC  GASTROSTOMY TUBE;  Surgeon: Kinsinger, Arta Bruce, MD;  Location: [facility];  Service: General;  Laterality: Left;  Marland Kitchen MICROLARYNGOSCOPY N/A 09/27/2018   Procedure: MICRO DIRECT LARYNGOSCOPY WITH BIOPSY;  Surgeon: Leta Baptist, MD;  Location: [facility];  Service: ENT;  Laterality: N/A;  . None to date     As of 04/14/15  .  in order to improve the following deficits and impairments:   Dysphagia, unspecified type    Problem List Patient Active Problem List   Diagnosis Date Noted  . Malignant neoplasm of right  upper lobe of lung ([place]) 05/27/2019  . Primary cancer of left upper lobe of lung ([place]) 05/27/2019  . Pseudomonas aeruginosa infection 05/21/2019  . Tracheobronchitis 05/21/2019  . Hx of radiation therapy 05/21/2019  . 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 Squamous cell lung cancer, right ([place]) 05/21/2019  . Adenocarcinoma, lung, left ([place]) 05/21/2019  . Lung nodule 04/23/2019  . Ground glass opacity present on imaging of lung 04/23/2019  . Head and neck cancer ([place]) 04/23/2019  . Tracheostomy tube present ([place]) 04/23/2019  . PEG (percutaneous endoscopic gastrostomy) adjustment/replacement/removal ([place]) 03/08/2019  . 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 Pleural effusion   . 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[place])   . Bacteremia   . Leukocytosis   . Normocytic anemia   . Protein-calorie malnutrition, severe 10/08/2018  . PSVT (paroxysmal supraventricular tachycardia) ([place]) 10/03/2018  . Atrial fibrillation ([place]) 10/03/2018  . Goals of care, counseling/discussion   . Dysphagia   . Loss of weight   . 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 Gastritis determined by endoscopy   . Esophageal dysphagia   . 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 Globus sensation 12/12/2016  . Smokes with greater than 40 pack year history 10/20/2016  . Hemorrhoids, internal, with bleeding 10/06/2015  . History of adenomatous polyp of colon 04/14/2015  . Vitamin D deficiency 10/09/2014  . HLD (hyperlipidemia) 10/08/2012  . GERD (gastroesophageal reflux disease) 10/08/2012  . HTN (hypertension)   . Diverticulitis 10/25/2009   Thank you,  Genene Churn, [place]  [facility] 06/18/2019, 11:07 AM  [facility] [address] [place], [name], [address] Phone: [phone]   Fax:  [phone]   Name: Logan Price MRN: [id number] Date of Birth: [birth year]  [place], [name], [address] Phone: [phone]   Fax:  [phone]  Speech Language Pathology Treatment  Patient Details  Name: Logan Price MRN: [id number] Date of Birth: 01/04/1946 Referring Provider (SLP): Eppie Gibson, MD   Encounter Date: 06/18/2019  End of Session - 06/18/19 1058    Visit Number  5    Number of Visits  9    Date for SLP Re-Evaluation  07/02/19    Authorization Type  Medicare    SLP Start Time  0950    SLP Stop Time   1030    SLP Time Calculation (min)  40 min    Activity Tolerance  Patient tolerated treatment well       Past Medical History:  Diagnosis Date  . Diverticulitis   . Dysrhythmia 09/2018   episode of SVT while in hospital  . 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 GERD (gastroesophageal reflux disease)   . 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 HTN (hypertension)   . Hyperlipidemia     Past Surgical History:  Procedure Laterality Date  . BIOPSY  05/04/2015   Procedure: BIOPSY;  Surgeon: Danie Binder, MD;  [facility];  Service: Endoscopy;;  bx's of ileocecal valve   . BRONCHIAL BRUSHINGS  04/29/2019   Procedure: BRONCHIAL BRUSHINGS;  Surgeon: Garner Nash, DO;  Location: [facility];  Service: Pulmonary;;  . BRONCHIAL NEEDLE ASPIRATION BIOPSY  04/29/2019   Procedure: BRONCHIAL NEEDLE ASPIRATION BIOPSIES;  Surgeon: Garner Nash, DO;  Location: [facility];  Service: Pulmonary;;  . BRONCHIAL WASHINGS  04/29/2019   Procedure: BRONCHIAL WASHINGS;  Surgeon: Garner Nash, DO;  [facility];  Service: Pulmonary;;  . COLONOSCOPY WITH PROPOFOL N/A 05/04/2015   Dr. Oneida Alar: normal appearing ileum with prominent IC valve with tubular adenomas, moderate diverticulosis in sigmoid colon, ascending colon, and retum. Moderate sized internal hemorrhoids. Surveillance in 5 years  . ELECTROMAGNETIC NAVIGATION BROCHOSCOPY  04/29/2019    Procedure: NAVIGATION BRONCHOSCOPY;  Surgeon: Garner Nash, DO;  Location: [facility];  Service: Pulmonary;;  . ESOPHAGOGASTRODUODENOSCOPY (EGD) WITH PROPOFOL N/A 12/19/2016   Procedure: ESOPHAGOGASTRODUODENOSCOPY (EGD) WITH PROPOFOL;  Surgeon: Danie Binder, MD;  [facility];  Service: Endoscopy;  Laterality: N/A;  11:30am  . FLEXIBLE SIGMOIDOSCOPY N/A 12/10/2015   hemorrhoid banding X 3   . HEMORRHOID BANDING N/A 12/10/2015   Procedure: HEMORRHOID BANDING;  Surgeon: Danie Binder, MD;  [facility];  Service: Endoscopy;  Laterality: N/A;  1:30 PM  . INSERTION OF SUPRAPUBIC CATHETER N/A 02/24/2019   Procedure: INSERTION OF SUPRAPUBIC CATHETER;  Surgeon: Kathie Rhodes, MD;  [facility];  Service: Urology;  Laterality: N/A;  . IR CM INJ ANY COLONIC TUBE W/FLUORO  10/30/2018  . IR GASTROSTOMY TUBE MOD SED  10/04/2018  . IR IMAGING GUIDED PORT INSERTION  10/04/2018  . IR REMOVAL TUN ACCESS W/ PORT W/O FL MOD SED  10/14/2018  . IR REPLACE G-TUBE SIMPLE WO FLUORO  11/03/2018  . LAPAROSCOPIC INSERTION GASTROSTOMY TUBE Left 10/07/2018   Procedure: LAPAROSCOPIC  GASTROSTOMY TUBE;  Surgeon: Kinsinger, Arta Bruce, MD;  Location: [facility];  Service: General;  Laterality: Left;  Marland Kitchen MICROLARYNGOSCOPY N/A 09/27/2018   Procedure: MICRO DIRECT LARYNGOSCOPY WITH BIOPSY;  Surgeon: Leta Baptist, MD;  Location: [facility];  Service: ENT;  Laterality: N/A;  . None to date     As of 04/14/15  .  in order to improve the following deficits and impairments:   Dysphagia, unspecified type    Problem List Patient Active Problem List   Diagnosis Date Noted  . Malignant neoplasm of right  upper lobe of lung ([place]) 05/27/2019  . Primary cancer of left upper lobe of lung ([place]) 05/27/2019  . Pseudomonas aeruginosa infection 05/21/2019  . Tracheobronchitis 05/21/2019  . Hx of radiation therapy 05/21/2019  . 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 Squamous cell lung cancer, right ([place]) 05/21/2019  . Adenocarcinoma, lung, left ([place]) 05/21/2019  . Lung nodule 04/23/2019  . Ground glass opacity present on imaging of lung 04/23/2019  . Head and neck cancer ([place]) 04/23/2019  . Tracheostomy tube present ([place]) 04/23/2019  . PEG (percutaneous endoscopic gastrostomy) adjustment/replacement/removal ([place]) 03/08/2019  . 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 Pleural effusion   . 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[place])   . Bacteremia   . Leukocytosis   . Normocytic anemia   . Protein-calorie malnutrition, severe 10/08/2018  . PSVT (paroxysmal supraventricular tachycardia) ([place]) 10/03/2018  . Atrial fibrillation ([place]) 10/03/2018  . Goals of care, counseling/discussion   . Dysphagia   . Loss of weight   . 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 Gastritis determined by endoscopy   . Esophageal dysphagia   . 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 Globus sensation 12/12/2016  . Smokes with greater than 40 pack year history 10/20/2016  . Hemorrhoids, internal, with bleeding 10/06/2015  . History of adenomatous polyp of colon 04/14/2015  . Vitamin D deficiency 10/09/2014  . HLD (hyperlipidemia) 10/08/2012  . GERD (gastroesophageal reflux disease) 10/08/2012  . HTN (hypertension)   . Diverticulitis 10/25/2009   Thank you,  Genene Churn, [place]  [facility] 06/18/2019, 11:07 AM  [facility] [address] [place], [name], [address] Phone: [phone]   Fax:  [phone]   Name: Logan Price MRN: [id number] Date of Birth: [birth year]

## 2019-06-18 NOTE — Telephone Encounter (Signed)
Scheduled apt per 3/23 sch message - unable to reach pt - left message with appt date and time

## 2019-06-19 DIAGNOSIS — Z8521 Personal history of malignant neoplasm of larynx: Secondary | ICD-10-CM | POA: Diagnosis not present

## 2019-06-19 DIAGNOSIS — R1312 Dysphagia, oropharyngeal phase: Secondary | ICD-10-CM | POA: Diagnosis not present

## 2019-06-23 ENCOUNTER — Other Ambulatory Visit: Payer: Self-pay

## 2019-06-23 ENCOUNTER — Encounter (HOSPITAL_COMMUNITY): Payer: Self-pay | Admitting: Speech Pathology

## 2019-06-23 ENCOUNTER — Ambulatory Visit (HOSPITAL_COMMUNITY): Payer: Medicare Other | Admitting: Speech Pathology

## 2019-06-23 ENCOUNTER — Ambulatory Visit (HOSPITAL_COMMUNITY)
Admission: RE | Admit: 2019-06-23 | Discharge: 2019-06-23 | Disposition: A | Discharge status: Home / Self Care | Payer: Medicare Other | Source: Ambulatory Visit | Attending: Radiation Oncology | Admitting: Radiation Oncology

## 2019-06-23 DIAGNOSIS — R05 Cough: Secondary | ICD-10-CM | POA: Diagnosis not present

## 2019-06-23 DIAGNOSIS — R1319 Other dysphagia: Secondary | ICD-10-CM | POA: Diagnosis not present

## 2019-06-23 DIAGNOSIS — R1313 Dysphagia, pharyngeal phase: Secondary | ICD-10-CM | POA: Diagnosis not present

## 2019-06-23 DIAGNOSIS — R1310 Dysphagia, unspecified: Secondary | ICD-10-CM | POA: Diagnosis not present

## 2019-06-23 DIAGNOSIS — C320 Malignant neoplasm of glottis: Secondary | ICD-10-CM

## 2019-06-23 NOTE — Therapy (Signed)
[place], [name], [address] Phone: [phone]   Fax:  [phone]  Modified Barium Swallow  Patient Details  Name: Logan Price MRN: [id number] Date of Birth: 12-08-1945 Referring Provider (SLP): Eppie Gibson, MD   Encounter Date: 06/23/2019  End of Session - 06/23/19 1443    Visit Number  1    Number of Visits  1    Authorization Type  Medicare    SLP Start Time  1130    SLP Stop Time   0160    SLP Time Calculation (min)  35 min    Activity Tolerance  Patient tolerated treatment well       Past Medical History:  Diagnosis Date  . Diverticulitis   . Dysrhythmia 09/2018   episode of SVT while in hospital  . 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 GERD (gastroesophageal reflux disease)   . 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 HTN (hypertension)   . Hyperlipidemia     Past Surgical History:  Procedure Laterality Date  . BIOPSY  05/04/2015   Procedure: BIOPSY;  Surgeon: Danie Binder, MD;  [facility];  Service: Endoscopy;;  bx's of ileocecal valve   . BRONCHIAL BRUSHINGS  04/29/2019   Procedure: BRONCHIAL BRUSHINGS;  Surgeon: Garner Nash, DO;  Location: [facility];  Service: Pulmonary;;  . BRONCHIAL NEEDLE ASPIRATION BIOPSY  04/29/2019   Procedure: BRONCHIAL NEEDLE ASPIRATION BIOPSIES;  Surgeon: Garner Nash, DO;  Location: [facility];  Service: Pulmonary;;  . BRONCHIAL WASHINGS  04/29/2019   Procedure: BRONCHIAL WASHINGS;  Surgeon: Garner Nash, DO;  [facility];  Service: Pulmonary;;  . COLONOSCOPY WITH PROPOFOL N/A 05/04/2015   Dr. Oneida Alar: normal appearing ileum with prominent IC valve with tubular adenomas, moderate diverticulosis in sigmoid colon, ascending colon, and retum. Moderate sized internal hemorrhoids. Surveillance in 5 years  . ELECTROMAGNETIC NAVIGATION BROCHOSCOPY  04/29/2019   Procedure: NAVIGATION BRONCHOSCOPY;  Surgeon: Garner Nash, DO;  Location: [facility];  Service: Pulmonary;;  . ESOPHAGOGASTRODUODENOSCOPY (EGD) WITH PROPOFOL N/A 12/19/2016   Procedure: ESOPHAGOGASTRODUODENOSCOPY (EGD) WITH PROPOFOL;  Surgeon: Danie Binder, MD;  [facility];  Service: Endoscopy;  Laterality: N/A;  11:30am  . FLEXIBLE SIGMOIDOSCOPY N/A 12/10/2015   hemorrhoid banding X 3   . HEMORRHOID BANDING N/A 12/10/2015   Procedure: HEMORRHOID BANDING;  Surgeon: Danie Binder, MD;  [facility];  Service: Endoscopy;  Laterality: N/A;  1:30 PM  . INSERTION OF SUPRAPUBIC CATHETER N/A 02/24/2019   Procedure: INSERTION OF SUPRAPUBIC CATHETER;  Surgeon: Kathie Rhodes, MD;  [facility];  Service: Urology;  Laterality: N/A;  . IR CM INJ ANY COLONIC TUBE W/FLUORO  10/30/2018  . IR GASTROSTOMY TUBE MOD SED  10/04/2018  . IR IMAGING GUIDED PORT INSERTION  10/04/2018  . IR REMOVAL TUN ACCESS W/ PORT W/O FL MOD SED  10/14/2018  . IR REPLACE G-TUBE SIMPLE WO FLUORO  11/03/2018  . LAPAROSCOPIC INSERTION GASTROSTOMY TUBE Left 10/07/2018   Procedure: LAPAROSCOPIC  GASTROSTOMY TUBE;  Surgeon: Kinsinger, Arta Bruce, MD;  Location: [facility];  Service: General;  Laterality: Left;  Marland Kitchen MICROLARYNGOSCOPY N/A 09/27/2018   Procedure: MICRO DIRECT LARYNGOSCOPY WITH BIOPSY;  Surgeon: Leta Baptist, MD;  Location: [facility];  Service: ENT;  Laterality: N/A;  . None to date     As of 04/14/15  . POLYPECTOMY  05/04/2015   Procedure: POLYPECTOMY;  Surgeon:  Patient will benefit from skilled therapeutic intervention in order to improve the following deficits and impairments:   Dysphagia, pharyngeal phase    Recommendations/Treatment - 06/23/19 1441      Swallow Evaluation Recommendations   SLP Diet Recommendations  Nectar;Dysphagia 2 (chopped)     Thickener user  Other (Comment)    Liquid Administration via  Cup;Spoon    Medication Administration  Whole meds with puree    Supervision  Patient able to self feed    Compensations  Small sips/bites;Multiple dry swallows after each bite/sip;Clear throat intermittently    Postural Changes  Seated upright at 90 degrees;Remain upright for at least 30 minutes after feeds/meals       Prognosis - 06/23/19 1442      Prognosis   Prognosis for Safe Diet Advancement  Good    Barriers to Reach Goals  Severity of deficits;Other (Comment)    Barriers/Prognosis Comment  long term effects of post radiation sequelae      Individuals Consulted   Consulted and Agree with Results and Recommendations  Patient;Family member/caregiver    Family Member Consulted  Spouse    Report Sent to   Referring physician       Problem List Patient Active Problem List   Diagnosis Date Noted  . Malignant neoplasm of right upper lobe of lung ([place]) 05/27/2019  . Primary cancer of left upper lobe of lung ([place]) 05/27/2019  . Pseudomonas aeruginosa infection 05/21/2019  . Tracheobronchitis 05/21/2019  . Hx of radiation therapy 05/21/2019  . 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[name]) 05/21/2019  . Squamous cell lung cancer, right ([place]) 05/21/2019  . Adenocarcinoma, lung, left ([place]) 05/21/2019  . Lung nodule 04/23/2019  . Ground glass opacity present on imaging of lung 04/23/2019  . Head and neck cancer ([place]) 04/23/2019  . Tracheostomy tube present ([place]) 04/23/2019  . PEG (percutaneous endoscopic gastrostomy) adjustment/replacement/removal ([place]) 03/08/2019  . 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 Pleural effusion   . 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[place])   . Bacteremia   . Leukocytosis   . Normocytic anemia   . Protein-calorie malnutrition, severe 10/08/2018  . PSVT (paroxysmal supraventricular tachycardia) ([place]) 10/03/2018  . Atrial fibrillation ([place]) 10/03/2018  . Goals of care, counseling/discussion   . Dysphagia   . Loss  of weight   . 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 Gastritis determined by endoscopy   . Esophageal dysphagia   . 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 Globus sensation 12/12/2016  . Smokes with greater than 40 pack year history 10/20/2016  . Hemorrhoids, internal, with bleeding 10/06/2015  . History of adenomatous polyp of colon 04/14/2015  . Vitamin D deficiency 10/09/2014  . HLD (hyperlipidemia) 10/08/2012  . GERD (gastroesophageal reflux disease) 10/08/2012  . HTN (hypertension)   . Diverticulitis 10/25/2009   Thank you,  Genene Churn, [place]  [facility] 06/23/2019, 2:47 PM  [place] [address] [place], [name], [address] Phone: [phone]   Fax:  [phone]  Name: SYLER NORCIA MRN: [id number] Date of Birth: [birth year]  [place], [name], [address] Phone: [phone]   Fax:  [phone]  Modified Barium Swallow  Patient Details  Name: Logan Price MRN: [id number] Date of Birth: 12-08-1945 Referring Provider (SLP): Eppie Gibson, MD   Encounter Date: 06/23/2019  End of Session - 06/23/19 1443    Visit Number  1    Number of Visits  1    Authorization Type  Medicare    SLP Start Time  1130    SLP Stop Time   0160    SLP Time Calculation (min)  35 min    Activity Tolerance  Patient tolerated treatment well       Past Medical History:  Diagnosis Date  . Diverticulitis   . Dysrhythmia 09/2018   episode of SVT while in hospital  . 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 GERD (gastroesophageal reflux disease)   . 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 HTN (hypertension)   . Hyperlipidemia     Past Surgical History:  Procedure Laterality Date  . BIOPSY  05/04/2015   Procedure: BIOPSY;  Surgeon: Danie Binder, MD;  [facility];  Service: Endoscopy;;  bx's of ileocecal valve   . BRONCHIAL BRUSHINGS  04/29/2019   Procedure: BRONCHIAL BRUSHINGS;  Surgeon: Garner Nash, DO;  Location: [facility];  Service: Pulmonary;;  . BRONCHIAL NEEDLE ASPIRATION BIOPSY  04/29/2019   Procedure: BRONCHIAL NEEDLE ASPIRATION BIOPSIES;  Surgeon: Garner Nash, DO;  Location: [facility];  Service: Pulmonary;;  . BRONCHIAL WASHINGS  04/29/2019   Procedure: BRONCHIAL WASHINGS;  Surgeon: Garner Nash, DO;  [facility];  Service: Pulmonary;;  . COLONOSCOPY WITH PROPOFOL N/A 05/04/2015   Dr. Oneida Alar: normal appearing ileum with prominent IC valve with tubular adenomas, moderate diverticulosis in sigmoid colon, ascending colon, and retum. Moderate sized internal hemorrhoids. Surveillance in 5 years  . ELECTROMAGNETIC NAVIGATION BROCHOSCOPY  04/29/2019   Procedure: NAVIGATION BRONCHOSCOPY;  Surgeon: Garner Nash, DO;  Location: [facility];  Service: Pulmonary;;  . ESOPHAGOGASTRODUODENOSCOPY (EGD) WITH PROPOFOL N/A 12/19/2016   Procedure: ESOPHAGOGASTRODUODENOSCOPY (EGD) WITH PROPOFOL;  Surgeon: Danie Binder, MD;  [facility];  Service: Endoscopy;  Laterality: N/A;  11:30am  . FLEXIBLE SIGMOIDOSCOPY N/A 12/10/2015   hemorrhoid banding X 3   . HEMORRHOID BANDING N/A 12/10/2015   Procedure: HEMORRHOID BANDING;  Surgeon: Danie Binder, MD;  [facility];  Service: Endoscopy;  Laterality: N/A;  1:30 PM  . INSERTION OF SUPRAPUBIC CATHETER N/A 02/24/2019   Procedure: INSERTION OF SUPRAPUBIC CATHETER;  Surgeon: Kathie Rhodes, MD;  [facility];  Service: Urology;  Laterality: N/A;  . IR CM INJ ANY COLONIC TUBE W/FLUORO  10/30/2018  . IR GASTROSTOMY TUBE MOD SED  10/04/2018  . IR IMAGING GUIDED PORT INSERTION  10/04/2018  . IR REMOVAL TUN ACCESS W/ PORT W/O FL MOD SED  10/14/2018  . IR REPLACE G-TUBE SIMPLE WO FLUORO  11/03/2018  . LAPAROSCOPIC INSERTION GASTROSTOMY TUBE Left 10/07/2018   Procedure: LAPAROSCOPIC  GASTROSTOMY TUBE;  Surgeon: Kinsinger, Arta Bruce, MD;  Location: [facility];  Service: General;  Laterality: Left;  Marland Kitchen MICROLARYNGOSCOPY N/A 09/27/2018   Procedure: MICRO DIRECT LARYNGOSCOPY WITH BIOPSY;  Surgeon: Leta Baptist, MD;  Location: [facility];  Service: ENT;  Laterality: N/A;  . None to date     As of 04/14/15  . POLYPECTOMY  05/04/2015   Procedure: POLYPECTOMY;  Surgeon:  [place], [name], [address] Phone: [phone]   Fax:  [phone]  Modified Barium Swallow  Patient Details  Name: Logan Price MRN: [id number] Date of Birth: 12-08-1945 Referring Provider (SLP): Eppie Gibson, MD   Encounter Date: 06/23/2019  End of Session - 06/23/19 1443    Visit Number  1    Number of Visits  1    Authorization Type  Medicare    SLP Start Time  1130    SLP Stop Time   0160    SLP Time Calculation (min)  35 min    Activity Tolerance  Patient tolerated treatment well       Past Medical History:  Diagnosis Date  . Diverticulitis   . Dysrhythmia 09/2018   episode of SVT while in hospital  . 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 GERD (gastroesophageal reflux disease)   . 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 HTN (hypertension)   . Hyperlipidemia     Past Surgical History:  Procedure Laterality Date  . BIOPSY  05/04/2015   Procedure: BIOPSY;  Surgeon: Danie Binder, MD;  [facility];  Service: Endoscopy;;  bx's of ileocecal valve   . BRONCHIAL BRUSHINGS  04/29/2019   Procedure: BRONCHIAL BRUSHINGS;  Surgeon: Garner Nash, DO;  Location: [facility];  Service: Pulmonary;;  . BRONCHIAL NEEDLE ASPIRATION BIOPSY  04/29/2019   Procedure: BRONCHIAL NEEDLE ASPIRATION BIOPSIES;  Surgeon: Garner Nash, DO;  Location: [facility];  Service: Pulmonary;;  . BRONCHIAL WASHINGS  04/29/2019   Procedure: BRONCHIAL WASHINGS;  Surgeon: Garner Nash, DO;  [facility];  Service: Pulmonary;;  . COLONOSCOPY WITH PROPOFOL N/A 05/04/2015   Dr. Oneida Alar: normal appearing ileum with prominent IC valve with tubular adenomas, moderate diverticulosis in sigmoid colon, ascending colon, and retum. Moderate sized internal hemorrhoids. Surveillance in 5 years  . ELECTROMAGNETIC NAVIGATION BROCHOSCOPY  04/29/2019   Procedure: NAVIGATION BRONCHOSCOPY;  Surgeon: Garner Nash, DO;  Location: [facility];  Service: Pulmonary;;  . ESOPHAGOGASTRODUODENOSCOPY (EGD) WITH PROPOFOL N/A 12/19/2016   Procedure: ESOPHAGOGASTRODUODENOSCOPY (EGD) WITH PROPOFOL;  Surgeon: Danie Binder, MD;  [facility];  Service: Endoscopy;  Laterality: N/A;  11:30am  . FLEXIBLE SIGMOIDOSCOPY N/A 12/10/2015   hemorrhoid banding X 3   . HEMORRHOID BANDING N/A 12/10/2015   Procedure: HEMORRHOID BANDING;  Surgeon: Danie Binder, MD;  [facility];  Service: Endoscopy;  Laterality: N/A;  1:30 PM  . INSERTION OF SUPRAPUBIC CATHETER N/A 02/24/2019   Procedure: INSERTION OF SUPRAPUBIC CATHETER;  Surgeon: Kathie Rhodes, MD;  [facility];  Service: Urology;  Laterality: N/A;  . IR CM INJ ANY COLONIC TUBE W/FLUORO  10/30/2018  . IR GASTROSTOMY TUBE MOD SED  10/04/2018  . IR IMAGING GUIDED PORT INSERTION  10/04/2018  . IR REMOVAL TUN ACCESS W/ PORT W/O FL MOD SED  10/14/2018  . IR REPLACE G-TUBE SIMPLE WO FLUORO  11/03/2018  . LAPAROSCOPIC INSERTION GASTROSTOMY TUBE Left 10/07/2018   Procedure: LAPAROSCOPIC  GASTROSTOMY TUBE;  Surgeon: Kinsinger, Arta Bruce, MD;  Location: [facility];  Service: General;  Laterality: Left;  Marland Kitchen MICROLARYNGOSCOPY N/A 09/27/2018   Procedure: MICRO DIRECT LARYNGOSCOPY WITH BIOPSY;  Surgeon: Leta Baptist, MD;  Location: [facility];  Service: ENT;  Laterality: N/A;  . None to date     As of 04/14/15  . POLYPECTOMY  05/04/2015   Procedure: POLYPECTOMY;  Surgeon:  [place], [name], [address] Phone: [phone]   Fax:  [phone]  Modified Barium Swallow  Patient Details  Name: Logan Price MRN: [id number] Date of Birth: 12-08-1945 Referring Provider (SLP): Eppie Gibson, MD   Encounter Date: 06/23/2019  End of Session - 06/23/19 1443    Visit Number  1    Number of Visits  1    Authorization Type  Medicare    SLP Start Time  1130    SLP Stop Time   0160    SLP Time Calculation (min)  35 min    Activity Tolerance  Patient tolerated treatment well       Past Medical History:  Diagnosis Date  . Diverticulitis   . Dysrhythmia 09/2018   episode of SVT while in hospital  . 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 GERD (gastroesophageal reflux disease)   . 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 HTN (hypertension)   . Hyperlipidemia     Past Surgical History:  Procedure Laterality Date  . BIOPSY  05/04/2015   Procedure: BIOPSY;  Surgeon: Danie Binder, MD;  [facility];  Service: Endoscopy;;  bx's of ileocecal valve   . BRONCHIAL BRUSHINGS  04/29/2019   Procedure: BRONCHIAL BRUSHINGS;  Surgeon: Garner Nash, DO;  Location: [facility];  Service: Pulmonary;;  . BRONCHIAL NEEDLE ASPIRATION BIOPSY  04/29/2019   Procedure: BRONCHIAL NEEDLE ASPIRATION BIOPSIES;  Surgeon: Garner Nash, DO;  Location: [facility];  Service: Pulmonary;;  . BRONCHIAL WASHINGS  04/29/2019   Procedure: BRONCHIAL WASHINGS;  Surgeon: Garner Nash, DO;  [facility];  Service: Pulmonary;;  . COLONOSCOPY WITH PROPOFOL N/A 05/04/2015   Dr. Oneida Alar: normal appearing ileum with prominent IC valve with tubular adenomas, moderate diverticulosis in sigmoid colon, ascending colon, and retum. Moderate sized internal hemorrhoids. Surveillance in 5 years  . ELECTROMAGNETIC NAVIGATION BROCHOSCOPY  04/29/2019   Procedure: NAVIGATION BRONCHOSCOPY;  Surgeon: Garner Nash, DO;  Location: [facility];  Service: Pulmonary;;  . ESOPHAGOGASTRODUODENOSCOPY (EGD) WITH PROPOFOL N/A 12/19/2016   Procedure: ESOPHAGOGASTRODUODENOSCOPY (EGD) WITH PROPOFOL;  Surgeon: Danie Binder, MD;  [facility];  Service: Endoscopy;  Laterality: N/A;  11:30am  . FLEXIBLE SIGMOIDOSCOPY N/A 12/10/2015   hemorrhoid banding X 3   . HEMORRHOID BANDING N/A 12/10/2015   Procedure: HEMORRHOID BANDING;  Surgeon: Danie Binder, MD;  [facility];  Service: Endoscopy;  Laterality: N/A;  1:30 PM  . INSERTION OF SUPRAPUBIC CATHETER N/A 02/24/2019   Procedure: INSERTION OF SUPRAPUBIC CATHETER;  Surgeon: Kathie Rhodes, MD;  [facility];  Service: Urology;  Laterality: N/A;  . IR CM INJ ANY COLONIC TUBE W/FLUORO  10/30/2018  . IR GASTROSTOMY TUBE MOD SED  10/04/2018  . IR IMAGING GUIDED PORT INSERTION  10/04/2018  . IR REMOVAL TUN ACCESS W/ PORT W/O FL MOD SED  10/14/2018  . IR REPLACE G-TUBE SIMPLE WO FLUORO  11/03/2018  . LAPAROSCOPIC INSERTION GASTROSTOMY TUBE Left 10/07/2018   Procedure: LAPAROSCOPIC  GASTROSTOMY TUBE;  Surgeon: Kinsinger, Arta Bruce, MD;  Location: [facility];  Service: General;  Laterality: Left;  Marland Kitchen MICROLARYNGOSCOPY N/A 09/27/2018   Procedure: MICRO DIRECT LARYNGOSCOPY WITH BIOPSY;  Surgeon: Leta Baptist, MD;  Location: [facility];  Service: ENT;  Laterality: N/A;  . None to date     As of 04/14/15  . POLYPECTOMY  05/04/2015   Procedure: POLYPECTOMY;  Surgeon:

## 2019-06-24 ENCOUNTER — Telehealth: Payer: Self-pay | Admitting: Family

## 2019-06-24 NOTE — Chronic Care Management (AMB) (Signed)
Chronic Care Management   Outreach Note  06/24/2019 Name: JESUA MCHAN MRN: [id number] DOB: January 28, 1946  Mosie Epstein is a 74 y.o. year old male who is a primary care patient of [name], FNP. I reached out to Mosie Epstein by phone today in response to a referral sent by Mr. Aerion Volker [employer]'s health plan.     An unsuccessful telephone outreach was attempted today. The patient was referred to the case management team for assistance with care management and care coordination.   Follow Up Plan: A HIPPA compliant phone message was left for the patient providing contact information and requesting a return call.  The care management team will reach out to the patient again over the next 7 days.  If patient returns call to provider office, please advise to call Embedded Care Management Care Guide Penne Lash  at [phone]  Penne Lash, RMA Care Guide, Embedded Care Coordination [facility]  [place], [place] [address] Direct Dial: [phone] [email or web address] Website: [email or web address]

## 2019-06-25 ENCOUNTER — Other Ambulatory Visit: Payer: Self-pay

## 2019-06-25 ENCOUNTER — Ambulatory Visit (HOSPITAL_COMMUNITY): Payer: Medicare Other | Admitting: Speech Pathology

## 2019-06-25 ENCOUNTER — Encounter (HOSPITAL_COMMUNITY): Payer: Self-pay | Admitting: Speech Pathology

## 2019-06-25 DIAGNOSIS — R1313 Dysphagia, pharyngeal phase: Secondary | ICD-10-CM

## 2019-06-25 DIAGNOSIS — R1310 Dysphagia, unspecified: Secondary | ICD-10-CM | POA: Diagnosis not present

## 2019-06-25 NOTE — Therapy (Signed)
education;Pharyngeal strengthening exercises;Diet toleration management by SLP;SLP instruction and feedback   MBSS   Potential to Achieve Goals  Fair    Potential Considerations   Severity of impairments    SLP Home Exercise Plan  Pt will completed HEP as assigned to facilitate carryover of treatment strategies in home environment    Consulted and Agree with Plan of Care  Patient;Family member/caregiver    Family Member Consulted  spouse       Patient will benefit from skilled therapeutic intervention in order to improve the following deficits and impairments:   Dysphagia, pharyngeal phase    Problem List Patient Active Problem List   Diagnosis Date Noted  . Malignant neoplasm of right upper lobe of lung ([place]) 05/27/2019  . Primary cancer of left upper lobe of lung ([place]) 05/27/2019  . Pseudomonas aeruginosa infection 05/21/2019  . Tracheobronchitis 05/21/2019  . Hx of radiation therapy 05/21/2019  . 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[place]) 05/21/2019  . Squamous cell lung cancer, right ([place]) 05/21/2019  . Adenocarcinoma, lung, left ([place]) 05/21/2019  . Lung nodule 04/23/2019  . Ground glass opacity present on imaging of lung 04/23/2019  . Head and neck cancer ([place]) 04/23/2019  . Tracheostomy tube present ([place]) 04/23/2019  . PEG (percutaneous endoscopic gastrostomy) adjustment/replacement/removal ([place]) 03/08/2019  . 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 Pleural effusion   . 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[place])   . Bacteremia   . Leukocytosis   . Normocytic anemia   . Protein-calorie malnutrition, severe 10/08/2018  . PSVT (paroxysmal supraventricular tachycardia) ([place]) 10/03/2018  . Atrial fibrillation ([place]) 10/03/2018  . Goals of care, counseling/discussion   . Dysphagia   . Loss of weight   . 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 Gastritis determined by endoscopy   . Esophageal dysphagia   . 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 Globus sensation 12/12/2016  . Smokes with greater than 40 pack year history 10/20/2016  . Hemorrhoids, internal, with bleeding 10/06/2015  . History of adenomatous polyp of colon 04/14/2015  .  Vitamin D deficiency 10/09/2014  . HLD (hyperlipidemia) 10/08/2012  . GERD (gastroesophageal reflux disease) 10/08/2012  . HTN (hypertension)   . Diverticulitis 10/25/2009   Thank you,  Genene Churn, [place]  [facility] 06/25/2019, 11:16 AM  [place] [address] [place], [name], [address] Phone: [phone]   Fax:  [phone]   Name: Logan TAAFFE MRN: [id number] Date of Birth: 02-05-1946  [place], [name], [address] Phone: [phone]   Fax:  [phone]  Speech Language Pathology Treatment  Patient Details  Name: Logan Price MRN: [id number] Date of Birth: December 08, 1945 Referring Provider (SLP): Eppie [place], MD   Encounter Date: 06/25/2019  End of Session - 06/25/19 1108    Visit Number  6    Number of Visits  9    Date for SLP Re-Evaluation  07/02/19    Authorization Type  Medicare    SLP Start Time  1001    SLP Stop Time   1050    SLP Time Calculation (min)  49 min    Activity Tolerance  Patient tolerated treatment well       Past Medical History:  Diagnosis Date  . Diverticulitis   . Dysrhythmia 09/2018   episode of SVT while in hospital  . 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 GERD (gastroesophageal reflux disease)   . 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 HTN (hypertension)   . Hyperlipidemia     Past Surgical History:  Procedure Laterality Date  . BIOPSY  05/04/2015   Procedure: BIOPSY;  Surgeon: Danie Binder, MD;  [facility];  Service: Endoscopy;;  bx's of ileocecal valve   . BRONCHIAL BRUSHINGS  04/29/2019   Procedure: BRONCHIAL BRUSHINGS;  Surgeon: Garner Nash, DO;  Location: [place];  Service: Pulmonary;;  . BRONCHIAL NEEDLE ASPIRATION BIOPSY  04/29/2019   Procedure: BRONCHIAL NEEDLE ASPIRATION BIOPSIES;  Surgeon: Garner Nash, DO;  Location: [place];  Service: Pulmonary;;  . BRONCHIAL WASHINGS  04/29/2019   Procedure: BRONCHIAL WASHINGS;  Surgeon: Garner Nash, DO;  [facility];  Service: Pulmonary;;  . COLONOSCOPY WITH PROPOFOL N/A 05/04/2015   Dr. Oneida Alar: normal appearing ileum with prominent IC valve with tubular adenomas, moderate diverticulosis in sigmoid colon, ascending colon, and retum. Moderate sized internal hemorrhoids. Surveillance in 5 years  . ELECTROMAGNETIC NAVIGATION BROCHOSCOPY  04/29/2019   Procedure: NAVIGATION BRONCHOSCOPY;  Surgeon: Garner Nash, DO;  Location: [place] [facility];  Service: Pulmonary;;  . ESOPHAGOGASTRODUODENOSCOPY (EGD) WITH PROPOFOL N/A 12/19/2016   Procedure: ESOPHAGOGASTRODUODENOSCOPY (EGD) WITH PROPOFOL;  Surgeon: Danie Binder, MD;  [facility];  Service: Endoscopy;  Laterality: N/A;  11:30am  . FLEXIBLE SIGMOIDOSCOPY N/A 12/10/2015   hemorrhoid banding X 3   . HEMORRHOID BANDING N/A 12/10/2015   Procedure: HEMORRHOID BANDING;  Surgeon: Danie Binder, MD;  [facility];  Service: Endoscopy;  Laterality: N/A;  1:30 PM  . INSERTION OF SUPRAPUBIC CATHETER N/A 02/24/2019   Procedure: INSERTION OF SUPRAPUBIC CATHETER;  Surgeon: Kathie Rhodes, MD;  [facility];  Service: Urology;  Laterality: N/A;  . IR CM INJ ANY COLONIC TUBE W/FLUORO  10/30/2018  . IR GASTROSTOMY TUBE MOD SED  10/04/2018  . IR IMAGING GUIDED PORT INSERTION  10/04/2018  . IR REMOVAL TUN ACCESS W/ PORT W/O FL MOD SED  10/14/2018  . IR REPLACE G-TUBE SIMPLE WO FLUORO  11/03/2018  . LAPAROSCOPIC INSERTION GASTROSTOMY TUBE Left 10/07/2018   Procedure: LAPAROSCOPIC  GASTROSTOMY TUBE;  Surgeon: Kinsinger, Arta Bruce, MD;  Location: [place];  Service: General;  Laterality: Left;  Marland Kitchen MICROLARYNGOSCOPY N/A 09/27/2018   Procedure: MICRO DIRECT LARYNGOSCOPY WITH BIOPSY;  Surgeon: Leta Baptist, MD;  Location: [facility] OR;  Service: ENT;  Laterality: N/A;  . None to date     As of 04/14/15  .  [place], [name], [address] Phone: [phone]   Fax:  [phone]  Speech Language Pathology Treatment  Patient Details  Name: Logan Price MRN: [id number] Date of Birth: December 08, 1945 Referring Provider (SLP): Eppie [place], MD   Encounter Date: 06/25/2019  End of Session - 06/25/19 1108    Visit Number  6    Number of Visits  9    Date for SLP Re-Evaluation  07/02/19    Authorization Type  Medicare    SLP Start Time  1001    SLP Stop Time   1050    SLP Time Calculation (min)  49 min    Activity Tolerance  Patient tolerated treatment well       Past Medical History:  Diagnosis Date  . Diverticulitis   . Dysrhythmia 09/2018   episode of SVT while in hospital  . 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 GERD (gastroesophageal reflux disease)   . 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 HTN (hypertension)   . Hyperlipidemia     Past Surgical History:  Procedure Laterality Date  . BIOPSY  05/04/2015   Procedure: BIOPSY;  Surgeon: Danie Binder, MD;  [facility];  Service: Endoscopy;;  bx's of ileocecal valve   . BRONCHIAL BRUSHINGS  04/29/2019   Procedure: BRONCHIAL BRUSHINGS;  Surgeon: Garner Nash, DO;  Location: [place];  Service: Pulmonary;;  . BRONCHIAL NEEDLE ASPIRATION BIOPSY  04/29/2019   Procedure: BRONCHIAL NEEDLE ASPIRATION BIOPSIES;  Surgeon: Garner Nash, DO;  Location: [place];  Service: Pulmonary;;  . BRONCHIAL WASHINGS  04/29/2019   Procedure: BRONCHIAL WASHINGS;  Surgeon: Garner Nash, DO;  [facility];  Service: Pulmonary;;  . COLONOSCOPY WITH PROPOFOL N/A 05/04/2015   Dr. Oneida Alar: normal appearing ileum with prominent IC valve with tubular adenomas, moderate diverticulosis in sigmoid colon, ascending colon, and retum. Moderate sized internal hemorrhoids. Surveillance in 5 years  . ELECTROMAGNETIC NAVIGATION BROCHOSCOPY  04/29/2019   Procedure: NAVIGATION BRONCHOSCOPY;  Surgeon: Garner Nash, DO;  Location: [place] [facility];  Service: Pulmonary;;  . ESOPHAGOGASTRODUODENOSCOPY (EGD) WITH PROPOFOL N/A 12/19/2016   Procedure: ESOPHAGOGASTRODUODENOSCOPY (EGD) WITH PROPOFOL;  Surgeon: Danie Binder, MD;  [facility];  Service: Endoscopy;  Laterality: N/A;  11:30am  . FLEXIBLE SIGMOIDOSCOPY N/A 12/10/2015   hemorrhoid banding X 3   . HEMORRHOID BANDING N/A 12/10/2015   Procedure: HEMORRHOID BANDING;  Surgeon: Danie Binder, MD;  [facility];  Service: Endoscopy;  Laterality: N/A;  1:30 PM  . INSERTION OF SUPRAPUBIC CATHETER N/A 02/24/2019   Procedure: INSERTION OF SUPRAPUBIC CATHETER;  Surgeon: Kathie Rhodes, MD;  [facility];  Service: Urology;  Laterality: N/A;  . IR CM INJ ANY COLONIC TUBE W/FLUORO  10/30/2018  . IR GASTROSTOMY TUBE MOD SED  10/04/2018  . IR IMAGING GUIDED PORT INSERTION  10/04/2018  . IR REMOVAL TUN ACCESS W/ PORT W/O FL MOD SED  10/14/2018  . IR REPLACE G-TUBE SIMPLE WO FLUORO  11/03/2018  . LAPAROSCOPIC INSERTION GASTROSTOMY TUBE Left 10/07/2018   Procedure: LAPAROSCOPIC  GASTROSTOMY TUBE;  Surgeon: Kinsinger, Arta Bruce, MD;  Location: [place];  Service: General;  Laterality: Left;  Marland Kitchen MICROLARYNGOSCOPY N/A 09/27/2018   Procedure: MICRO DIRECT LARYNGOSCOPY WITH BIOPSY;  Surgeon: Leta Baptist, MD;  Location: [facility] OR;  Service: ENT;  Laterality: N/A;  . None to date     As of 04/14/15  .  [place], [name], [address] Phone: [phone]   Fax:  [phone]  Speech Language Pathology Treatment  Patient Details  Name: Logan Price MRN: [id number] Date of Birth: December 08, 1945 Referring Provider (SLP): Eppie [place], MD   Encounter Date: 06/25/2019  End of Session - 06/25/19 1108    Visit Number  6    Number of Visits  9    Date for SLP Re-Evaluation  07/02/19    Authorization Type  Medicare    SLP Start Time  1001    SLP Stop Time   1050    SLP Time Calculation (min)  49 min    Activity Tolerance  Patient tolerated treatment well       Past Medical History:  Diagnosis Date  . Diverticulitis   . Dysrhythmia 09/2018   episode of SVT while in hospital  . 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 GERD (gastroesophageal reflux disease)   . 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 HTN (hypertension)   . Hyperlipidemia     Past Surgical History:  Procedure Laterality Date  . BIOPSY  05/04/2015   Procedure: BIOPSY;  Surgeon: Danie Binder, MD;  [facility];  Service: Endoscopy;;  bx's of ileocecal valve   . BRONCHIAL BRUSHINGS  04/29/2019   Procedure: BRONCHIAL BRUSHINGS;  Surgeon: Garner Nash, DO;  Location: [place];  Service: Pulmonary;;  . BRONCHIAL NEEDLE ASPIRATION BIOPSY  04/29/2019   Procedure: BRONCHIAL NEEDLE ASPIRATION BIOPSIES;  Surgeon: Garner Nash, DO;  Location: [place];  Service: Pulmonary;;  . BRONCHIAL WASHINGS  04/29/2019   Procedure: BRONCHIAL WASHINGS;  Surgeon: Garner Nash, DO;  [facility];  Service: Pulmonary;;  . COLONOSCOPY WITH PROPOFOL N/A 05/04/2015   Dr. Oneida Alar: normal appearing ileum with prominent IC valve with tubular adenomas, moderate diverticulosis in sigmoid colon, ascending colon, and retum. Moderate sized internal hemorrhoids. Surveillance in 5 years  . ELECTROMAGNETIC NAVIGATION BROCHOSCOPY  04/29/2019   Procedure: NAVIGATION BRONCHOSCOPY;  Surgeon: Garner Nash, DO;  Location: [place] [facility];  Service: Pulmonary;;  . ESOPHAGOGASTRODUODENOSCOPY (EGD) WITH PROPOFOL N/A 12/19/2016   Procedure: ESOPHAGOGASTRODUODENOSCOPY (EGD) WITH PROPOFOL;  Surgeon: Danie Binder, MD;  [facility];  Service: Endoscopy;  Laterality: N/A;  11:30am  . FLEXIBLE SIGMOIDOSCOPY N/A 12/10/2015   hemorrhoid banding X 3   . HEMORRHOID BANDING N/A 12/10/2015   Procedure: HEMORRHOID BANDING;  Surgeon: Danie Binder, MD;  [facility];  Service: Endoscopy;  Laterality: N/A;  1:30 PM  . INSERTION OF SUPRAPUBIC CATHETER N/A 02/24/2019   Procedure: INSERTION OF SUPRAPUBIC CATHETER;  Surgeon: Kathie Rhodes, MD;  [facility];  Service: Urology;  Laterality: N/A;  . IR CM INJ ANY COLONIC TUBE W/FLUORO  10/30/2018  . IR GASTROSTOMY TUBE MOD SED  10/04/2018  . IR IMAGING GUIDED PORT INSERTION  10/04/2018  . IR REMOVAL TUN ACCESS W/ PORT W/O FL MOD SED  10/14/2018  . IR REPLACE G-TUBE SIMPLE WO FLUORO  11/03/2018  . LAPAROSCOPIC INSERTION GASTROSTOMY TUBE Left 10/07/2018   Procedure: LAPAROSCOPIC  GASTROSTOMY TUBE;  Surgeon: Kinsinger, Arta Bruce, MD;  Location: [place];  Service: General;  Laterality: Left;  Marland Kitchen MICROLARYNGOSCOPY N/A 09/27/2018   Procedure: MICRO DIRECT LARYNGOSCOPY WITH BIOPSY;  Surgeon: Leta Baptist, MD;  Location: [facility] OR;  Service: ENT;  Laterality: N/A;  . None to date     As of 04/14/15  .

## 2019-06-27 ENCOUNTER — Other Ambulatory Visit (HOSPITAL_COMMUNITY): Payer: Medicare Other

## 2019-06-30 ENCOUNTER — Other Ambulatory Visit: Payer: Self-pay

## 2019-06-30 ENCOUNTER — Encounter (HOSPITAL_COMMUNITY): Payer: Self-pay | Admitting: Speech Pathology

## 2019-06-30 ENCOUNTER — Ambulatory Visit (HOSPITAL_COMMUNITY): Payer: Medicare Other | Attending: Family | Admitting: Speech Pathology

## 2019-06-30 DIAGNOSIS — R1313 Dysphagia, pharyngeal phase: Secondary | ICD-10-CM | POA: Insufficient documentation

## 2019-06-30 NOTE — Therapy (Signed)
skilled therapeutic intervention in order to improve the following deficits and impairments:   Dysphagia, pharyngeal phase    Problem List Patient Active Problem List   Diagnosis Date Noted  . Malignant neoplasm of right upper lobe of lung ([place]) 05/27/2019  . Primary cancer of left upper lobe of lung  ([place]) 05/27/2019  . Pseudomonas aeruginosa infection 05/21/2019  . Tracheobronchitis 05/21/2019  . Hx of radiation therapy 05/21/2019  . 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[place]) 05/21/2019  . 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 Adenocarcinoma, lung, left ([place]) 05/21/2019  . Lung nodule 04/23/2019  . Ground glass opacity present on imaging of lung 04/23/2019  . Head and neck cancer ([place]) 04/23/2019  . Tracheostomy tube present ([place]) 04/23/2019  . PEG (percutaneous endoscopic gastrostomy) adjustment/replacement/removal ([place]) 03/08/2019  . 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 Pleural effusion   . 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[place])   . Bacteremia   . Leukocytosis   . Normocytic anemia   . Protein-calorie malnutrition, severe 10/08/2018  . PSVT (paroxysmal supraventricular tachycardia) ([place]) 10/03/2018  . Atrial fibrillation ([place]) 10/03/2018  . Goals of care, counseling/discussion   . Dysphagia   . Loss of weight   . 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 Gastritis determined by endoscopy   . Esophageal dysphagia   . 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 Globus sensation 12/12/2016  . Smokes with greater than 40 pack year history 10/20/2016  . Hemorrhoids, internal, with bleeding 10/06/2015  . History of adenomatous polyp of colon 04/14/2015  . Vitamin D deficiency 10/09/2014  . HLD (hyperlipidemia) 10/08/2012  . GERD (gastroesophageal reflux disease) 10/08/2012  . HTN (hypertension)   . Diverticulitis 10/25/2009   Thank you,  Genene Churn, [place]  [facility] 06/30/2019, 10:36 AM  [place], [name], [address] Phone: [phone]   Fax:  [phone]   Name: Logan [place] MRN: [id number] Date of Birth: 1946-01-20  skilled therapeutic intervention in order to improve the following deficits and impairments:   Dysphagia, pharyngeal phase    Problem List Patient Active Problem List   Diagnosis Date Noted  . Malignant neoplasm of right upper lobe of lung ([place]) 05/27/2019  . Primary cancer of left upper lobe of lung  ([place]) 05/27/2019  . Pseudomonas aeruginosa infection 05/21/2019  . Tracheobronchitis 05/21/2019  . Hx of radiation therapy 05/21/2019  . 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[place]) 05/21/2019  . 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 Adenocarcinoma, lung, left ([place]) 05/21/2019  . Lung nodule 04/23/2019  . Ground glass opacity present on imaging of lung 04/23/2019  . Head and neck cancer ([place]) 04/23/2019  . Tracheostomy tube present ([place]) 04/23/2019  . PEG (percutaneous endoscopic gastrostomy) adjustment/replacement/removal ([place]) 03/08/2019  . 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 Pleural effusion   . 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[place])   . Bacteremia   . Leukocytosis   . Normocytic anemia   . Protein-calorie malnutrition, severe 10/08/2018  . PSVT (paroxysmal supraventricular tachycardia) ([place]) 10/03/2018  . Atrial fibrillation ([place]) 10/03/2018  . Goals of care, counseling/discussion   . Dysphagia   . Loss of weight   . 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 Gastritis determined by endoscopy   . Esophageal dysphagia   . 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 Globus sensation 12/12/2016  . Smokes with greater than 40 pack year history 10/20/2016  . Hemorrhoids, internal, with bleeding 10/06/2015  . History of adenomatous polyp of colon 04/14/2015  . Vitamin D deficiency 10/09/2014  . HLD (hyperlipidemia) 10/08/2012  . GERD (gastroesophageal reflux disease) 10/08/2012  . HTN (hypertension)   . Diverticulitis 10/25/2009   Thank you,  Genene Churn, [place]  [facility] 06/30/2019, 10:36 AM  [place], [name], [address] Phone: [phone]   Fax:  [phone]   Name: Logan Price MRN: [id number] Date of Birth: 1946-01-20  [place], [name], [address] Phone: [phone]   Fax:  [phone]  Speech Language Pathology Treatment  Patient Details  Name: Logan Price MRN: [id number] Date of Birth: 21-Mar-1946 Referring Provider (SLP): Eppie [place], MD   Encounter Date: 06/30/2019  End of Session - 06/30/19 1035    Visit Number  7    Number of Visits  9    Date for SLP Re-Evaluation  07/02/19    Authorization Type  Medicare    SLP Start Time  0947    SLP Stop Time   1030    SLP Time Calculation (min)  43 min    Activity Tolerance  Patient tolerated treatment well       Past Medical History:  Diagnosis Date  . Diverticulitis   . Dysrhythmia 09/2018   episode of SVT while in hospital  . 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 GERD (gastroesophageal reflux disease)   . 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 HTN (hypertension)   . Hyperlipidemia     Past Surgical History:  Procedure Laterality Date  . BIOPSY  05/04/2015   Procedure: BIOPSY;  Surgeon: Danie Binder, MD;  [facility];  Service: Endoscopy;;  bx's of ileocecal valve   . BRONCHIAL BRUSHINGS  04/29/2019   Procedure: BRONCHIAL BRUSHINGS;  Surgeon: Garner Nash, DO;  Location: [place];  Service: Pulmonary;;  . BRONCHIAL NEEDLE ASPIRATION BIOPSY  04/29/2019   Procedure: BRONCHIAL NEEDLE ASPIRATION BIOPSIES;  Surgeon: Garner Nash, DO;  Location: [place];  Service: Pulmonary;;  . BRONCHIAL WASHINGS  04/29/2019   Procedure: BRONCHIAL WASHINGS;  Surgeon: Garner Nash, DO;  [facility];  Service: Pulmonary;;  . COLONOSCOPY WITH PROPOFOL N/A 05/04/2015   Dr. Oneida Alar: normal appearing ileum with prominent IC valve with tubular adenomas, moderate diverticulosis in sigmoid colon, ascending colon, and retum. Moderate sized internal hemorrhoids. Surveillance in 5 years  . ELECTROMAGNETIC NAVIGATION BROCHOSCOPY  04/29/2019   Procedure:  NAVIGATION BRONCHOSCOPY;  Surgeon: Garner Nash, DO;  Location: [place] [facility];  Service: Pulmonary;;  . ESOPHAGOGASTRODUODENOSCOPY (EGD) WITH PROPOFOL N/A 12/19/2016   Procedure: ESOPHAGOGASTRODUODENOSCOPY (EGD) WITH PROPOFOL;  Surgeon: Danie Binder, MD;  [facility];  Service: Endoscopy;  Laterality: N/A;  11:30am  . FLEXIBLE SIGMOIDOSCOPY N/A 12/10/2015   hemorrhoid banding X 3   . HEMORRHOID BANDING N/A 12/10/2015   Procedure: HEMORRHOID BANDING;  Surgeon: Danie Binder, MD;  [facility];  Service: Endoscopy;  Laterality: N/A;  1:30 PM  . INSERTION OF SUPRAPUBIC CATHETER N/A 02/24/2019   Procedure: INSERTION OF SUPRAPUBIC CATHETER;  Surgeon: Kathie Rhodes, MD;  [facility];  Service: Urology;  Laterality: N/A;  . IR CM INJ ANY COLONIC TUBE W/FLUORO  10/30/2018  . IR GASTROSTOMY TUBE MOD SED  10/04/2018  . IR IMAGING GUIDED PORT INSERTION  10/04/2018  . IR REMOVAL TUN ACCESS W/ PORT W/O FL MOD SED  10/14/2018  . IR REPLACE G-TUBE SIMPLE WO FLUORO  11/03/2018  . LAPAROSCOPIC INSERTION GASTROSTOMY TUBE Left 10/07/2018   Procedure: LAPAROSCOPIC  GASTROSTOMY TUBE;  Surgeon: Kinsinger, Arta Bruce, MD;  Location: [place];  Service: General;  Laterality: Left;  Marland Kitchen MICROLARYNGOSCOPY N/A 09/27/2018   Procedure: MICRO DIRECT LARYNGOSCOPY WITH BIOPSY;  Surgeon: Leta Baptist, MD;  Location: [facility];  Service: ENT;  Laterality: N/A;  . None to date     As of 04/14/15  .  skilled therapeutic intervention in order to improve the following deficits and impairments:   Dysphagia, pharyngeal phase    Problem List Patient Active Problem List   Diagnosis Date Noted  . Malignant neoplasm of right upper lobe of lung ([place]) 05/27/2019  . Primary cancer of left upper lobe of lung  ([place]) 05/27/2019  . Pseudomonas aeruginosa infection 05/21/2019  . Tracheobronchitis 05/21/2019  . Hx of radiation therapy 05/21/2019  . 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[place]) 05/21/2019  . 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 Adenocarcinoma, lung, left ([place]) 05/21/2019  . Lung nodule 04/23/2019  . Ground glass opacity present on imaging of lung 04/23/2019  . Head and neck cancer ([place]) 04/23/2019  . Tracheostomy tube present ([place]) 04/23/2019  . PEG (percutaneous endoscopic gastrostomy) adjustment/replacement/removal ([place]) 03/08/2019  . 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 Pleural effusion   . 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[place])   . Bacteremia   . Leukocytosis   . Normocytic anemia   . Protein-calorie malnutrition, severe 10/08/2018  . PSVT (paroxysmal supraventricular tachycardia) ([place]) 10/03/2018  . Atrial fibrillation ([place]) 10/03/2018  . Goals of care, counseling/discussion   . Dysphagia   . Loss of weight   . 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 Gastritis determined by endoscopy   . Esophageal dysphagia   . 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 Globus sensation 12/12/2016  . Smokes with greater than 40 pack year history 10/20/2016  . Hemorrhoids, internal, with bleeding 10/06/2015  . History of adenomatous polyp of colon 04/14/2015  . Vitamin D deficiency 10/09/2014  . HLD (hyperlipidemia) 10/08/2012  . GERD (gastroesophageal reflux disease) 10/08/2012  . HTN (hypertension)   . Diverticulitis 10/25/2009   Thank you,  Genene Churn, [place]  [facility] 06/30/2019, 10:36 AM  [place], [name], [address] Phone: [phone]   Fax:  [phone]   Name: Logan [place] MRN: [id number] Date of Birth: 1946-01-20

## 2019-07-02 ENCOUNTER — Other Ambulatory Visit: Payer: Self-pay

## 2019-07-02 ENCOUNTER — Encounter (HOSPITAL_COMMUNITY): Payer: Self-pay | Admitting: Speech Pathology

## 2019-07-02 ENCOUNTER — Ambulatory Visit (HOSPITAL_COMMUNITY): Payer: Medicare Other | Admitting: Speech Pathology

## 2019-07-02 DIAGNOSIS — R1313 Dysphagia, pharyngeal phase: Secondary | ICD-10-CM | POA: Diagnosis not present

## 2019-07-02 NOTE — Therapy (Signed)
[place], [name], [address] Phone: [phone]   Fax:  [phone]  Speech Language Pathology Treatment  Patient Details  Name: Logan Price MRN: [id number] Date of Birth: 1945-04-26 Referring Provider (SLP): Eppie Gibson, MD   Encounter Date: 07/02/2019  End of Session - 07/02/19 1113    Visit Number  8    Number of Visits  9    Date for SLP Re-Evaluation  07/02/19    Authorization Type  Medicare    SLP Start Time  0947    SLP Stop Time   1030    SLP Time Calculation (min)  43 min    Activity Tolerance  Patient tolerated treatment well       Past Medical History:  Diagnosis Date  . Diverticulitis   . Dysrhythmia 09/2018   episode of SVT while in hospital  . 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 GERD (gastroesophageal reflux disease)   . 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 HTN (hypertension)   . Hyperlipidemia     Past Surgical History:  Procedure Laterality Date  . BIOPSY  05/04/2015   Procedure: BIOPSY;  Surgeon: Danie Binder, MD;  [facility];  Service: Endoscopy;;  bx's of ileocecal valve   . BRONCHIAL BRUSHINGS  04/29/2019   Procedure: BRONCHIAL BRUSHINGS;  Surgeon: Garner Nash, DO;  Location: [facility];  Service: Pulmonary;;  . BRONCHIAL NEEDLE ASPIRATION BIOPSY  04/29/2019   Procedure: BRONCHIAL NEEDLE ASPIRATION BIOPSIES;  Surgeon: Garner Nash, DO;  Location: [facility];  Service: Pulmonary;;  . BRONCHIAL WASHINGS  04/29/2019   Procedure: BRONCHIAL WASHINGS;  Surgeon: Garner Nash, DO;  Location: [facility];  Service: Pulmonary;;  . COLONOSCOPY WITH PROPOFOL N/A 05/04/2015   Dr. Oneida Alar: normal appearing ileum with prominent IC valve with tubular adenomas, moderate diverticulosis in sigmoid colon, ascending colon, and retum. Moderate sized internal hemorrhoids. Surveillance in 5 years  . ELECTROMAGNETIC NAVIGATION BROCHOSCOPY  04/29/2019   Procedure:  NAVIGATION BRONCHOSCOPY;  Surgeon: Garner Nash, DO;  Location: [facility];  Service: Pulmonary;;  . ESOPHAGOGASTRODUODENOSCOPY (EGD) WITH PROPOFOL N/A 12/19/2016   Procedure: ESOPHAGOGASTRODUODENOSCOPY (EGD) WITH PROPOFOL;  Surgeon: Danie Binder, MD;  [facility];  Service: Endoscopy;  Laterality: N/A;  11:30am  . FLEXIBLE SIGMOIDOSCOPY N/A 12/10/2015   hemorrhoid banding X 3   . HEMORRHOID BANDING N/A 12/10/2015   Procedure: HEMORRHOID BANDING;  Surgeon: Danie Binder, MD;  [facility];  Service: Endoscopy;  Laterality: N/A;  1:30 PM  . INSERTION OF SUPRAPUBIC CATHETER N/A 02/24/2019   Procedure: INSERTION OF SUPRAPUBIC CATHETER;  Surgeon: Kathie Rhodes, MD;  Location: [facility];  Service: Urology;  Laterality: N/A;  . IR CM INJ ANY COLONIC TUBE W/FLUORO  10/30/2018  . IR GASTROSTOMY TUBE MOD SED  10/04/2018  . IR IMAGING GUIDED PORT INSERTION  10/04/2018  . IR REMOVAL TUN ACCESS W/ PORT W/O FL MOD SED  10/14/2018  . IR REPLACE G-TUBE SIMPLE WO FLUORO  11/03/2018  . LAPAROSCOPIC INSERTION GASTROSTOMY TUBE Left 10/07/2018   Procedure: LAPAROSCOPIC  GASTROSTOMY TUBE;  Surgeon: Kinsinger, Arta Bruce, MD;  Location: [facility];  Service: General;  Laterality: Left;  Marland Kitchen MICROLARYNGOSCOPY N/A 09/27/2018   Procedure: MICRO DIRECT LARYNGOSCOPY WITH BIOPSY;  Surgeon: Leta Baptist, MD;  Location: [facility];  Service: ENT;  Laterality: N/A;  . None to date     As of 04/14/15  .  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 Globus sensation 12/12/2016  . Smokes with greater than 40 pack year history 10/20/2016  . Hemorrhoids, internal, with bleeding 10/06/2015  . History of adenomatous polyp of colon 04/14/2015  . Vitamin D deficiency 10/09/2014  . HLD (hyperlipidemia) 10/08/2012  . GERD (gastroesophageal reflux disease) 10/08/2012  . HTN (hypertension)   . Diverticulitis 10/25/2009   Thank you,  Genene Churn, [place]  [facility] 07/02/2019, 11:19 AM  [facility] [address] [place], [name], [address] Phone: [phone]   Fax:  [phone]   Name: Logan Price MRN: [id number] Date of Birth: [birth year]  [place], [name], [address] Phone: [phone]   Fax:  [phone]  Speech Language Pathology Treatment  Patient Details  Name: Logan Price MRN: [id number] Date of Birth: 1945-04-26 Referring Provider (SLP): Eppie Gibson, MD   Encounter Date: 07/02/2019  End of Session - 07/02/19 1113    Visit Number  8    Number of Visits  9    Date for SLP Re-Evaluation  07/02/19    Authorization Type  Medicare    SLP Start Time  0947    SLP Stop Time   1030    SLP Time Calculation (min)  43 min    Activity Tolerance  Patient tolerated treatment well       Past Medical History:  Diagnosis Date  . Diverticulitis   . Dysrhythmia 09/2018   episode of SVT while in hospital  . 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 GERD (gastroesophageal reflux disease)   . 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 HTN (hypertension)   . Hyperlipidemia     Past Surgical History:  Procedure Laterality Date  . BIOPSY  05/04/2015   Procedure: BIOPSY;  Surgeon: Danie Binder, MD;  [facility];  Service: Endoscopy;;  bx's of ileocecal valve   . BRONCHIAL BRUSHINGS  04/29/2019   Procedure: BRONCHIAL BRUSHINGS;  Surgeon: Garner Nash, DO;  Location: [facility];  Service: Pulmonary;;  . BRONCHIAL NEEDLE ASPIRATION BIOPSY  04/29/2019   Procedure: BRONCHIAL NEEDLE ASPIRATION BIOPSIES;  Surgeon: Garner Nash, DO;  Location: [facility];  Service: Pulmonary;;  . BRONCHIAL WASHINGS  04/29/2019   Procedure: BRONCHIAL WASHINGS;  Surgeon: Garner Nash, DO;  [facility];  Service: Pulmonary;;  . COLONOSCOPY WITH PROPOFOL N/A 05/04/2015   Dr. Oneida Alar: normal appearing ileum with prominent IC valve with tubular adenomas, moderate diverticulosis in sigmoid colon, ascending colon, and retum. Moderate sized internal hemorrhoids. Surveillance in 5 years  . ELECTROMAGNETIC NAVIGATION BROCHOSCOPY  04/29/2019   Procedure:  NAVIGATION BRONCHOSCOPY;  Surgeon: Garner Nash, DO;  Location: [facility];  Service: Pulmonary;;  . ESOPHAGOGASTRODUODENOSCOPY (EGD) WITH PROPOFOL N/A 12/19/2016   Procedure: ESOPHAGOGASTRODUODENOSCOPY (EGD) WITH PROPOFOL;  Surgeon: Danie Binder, MD;  [facility];  Service: Endoscopy;  Laterality: N/A;  11:30am  . FLEXIBLE SIGMOIDOSCOPY N/A 12/10/2015   hemorrhoid banding X 3   . HEMORRHOID BANDING N/A 12/10/2015   Procedure: HEMORRHOID BANDING;  Surgeon: Danie Binder, MD;  [facility];  Service: Endoscopy;  Laterality: N/A;  1:30 PM  . INSERTION OF SUPRAPUBIC CATHETER N/A 02/24/2019   Procedure: INSERTION OF SUPRAPUBIC CATHETER;  Surgeon: Kathie Rhodes, MD;  [facility];  Service: Urology;  Laterality: N/A;  . IR CM INJ ANY COLONIC TUBE W/FLUORO  10/30/2018  . IR GASTROSTOMY TUBE MOD SED  10/04/2018  . IR IMAGING GUIDED PORT INSERTION  10/04/2018  . IR REMOVAL TUN ACCESS W/ PORT W/O FL MOD SED  10/14/2018  . IR REPLACE G-TUBE SIMPLE WO FLUORO  11/03/2018  . LAPAROSCOPIC INSERTION GASTROSTOMY TUBE Left 10/07/2018   Procedure: LAPAROSCOPIC  GASTROSTOMY TUBE;  Surgeon: Kinsinger, Arta Bruce, MD;  Location: [facility];  Service: General;  Laterality: Left;  Marland Kitchen MICROLARYNGOSCOPY N/A 09/27/2018   Procedure: MICRO DIRECT LARYNGOSCOPY WITH BIOPSY;  Surgeon: Leta Baptist, MD;  Location: [facility];  Service: ENT;  Laterality: N/A;  . None to date     As of 04/14/15  .  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 Globus sensation 12/12/2016  . Smokes with greater than 40 pack year history 10/20/2016  . Hemorrhoids, internal, with bleeding 10/06/2015  . History of adenomatous polyp of colon 04/14/2015  . Vitamin D deficiency 10/09/2014  . HLD (hyperlipidemia) 10/08/2012  . GERD (gastroesophageal reflux disease) 10/08/2012  . HTN (hypertension)   . Diverticulitis 10/25/2009   Thank you,  Genene Churn, [place]  [facility] 07/02/2019, 11:19 AM  [facility] [address] [place], [name], [address] Phone: [phone]   Fax:  [phone]   Name: Logan Price MRN: [id number] Date of Birth: [birth year]  False positive serological test for hepatitis C 12/13/2016  . Globus sensation 12/12/2016  . Smokes with greater than 40 pack year history 10/20/2016  . Hemorrhoids, internal, with bleeding 10/06/2015  . History of adenomatous polyp of colon 04/14/2015  . Vitamin D deficiency 10/09/2014  . HLD (hyperlipidemia) 10/08/2012  . GERD (gastroesophageal reflux disease) 10/08/2012  . HTN (hypertension)   . Diverticulitis 10/25/2009   Thank you,  Genene Churn, [place]  [facility] 07/02/2019, 11:19 AM  [facility] [address] [place], [name], [address] Phone: [phone]   Fax:  [phone]   Name: Logan Price MRN: [id number] Date of Birth: [birth year]

## 2019-07-07 NOTE — Chronic Care Management (AMB) (Signed)
Chronic Care Management   Note  07/07/2019 Name: Logan Price MRN: [id number] DOB: Oct 14, 1945  Logan Price is a 74 y.o. year old male who is a primary care patient of Sharion Balloon, FNP. I reached out to Logan Price by phone today in response to a referral sent by Logan Price's health plan.     Logan Price was given information about Chronic Care Management services today including:  1. CCM service includes personalized support from designated clinical staff supervised by his physician, including individualized plan of care and coordination with other care providers 2. 24/7 contact phone numbers for assistance for urgent and routine care needs. 3. Service will only be billed when office clinical staff spend 20 minutes or more in a month to coordinate care. 4. Only one practitioner may furnish and bill the service in a calendar month. 5. The patient may stop CCM services at any time (effective at the end of the month) by phone call to the office staff. 6. The patient will be responsible for cost sharing (co-pay) of up to 20% of the service fee (after annual deductible is met).  Patient agreed to services and verbal consent obtained.   Follow up plan: Telephone appointment with care management team member scheduled for:07/24/2019  Logan Price, [place], [place], [place] [address] Direct Dial: [phone] [email or web address] Website: [email or web address]

## 2019-07-15 ENCOUNTER — Other Ambulatory Visit: Payer: Self-pay

## 2019-07-15 ENCOUNTER — Inpatient Hospital Stay: Payer: Medicare Other | Attending: Hematology

## 2019-07-15 NOTE — Progress Notes (Signed)
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  Patient received PEG in July 2020.  Patient has completed radiation for bilateral lung nodules.  SLP notes reviewed and noted soft solid (dysphagia 3) with honey thick liquids by cup and nectar thick liquids by tsp.    Met with patient in clinic this pm.  Patient taking 2 cartons of [facility] via pump running at rate of 187m/hr from 8am-noon. Patient is taking prostat and 2076mwater flush at 8am and 8pm.  Did not like the taste of it when tried with SLP.   Patient brought food diary.  Drinking 2-3 ensure original (thickened) daily.  Typically has ensure at breakfast.  Reports that he has never eaten breakfast.  Has added oatmeal but does not really like it.  Lunch is soups, ensure. Has tried vienna sausages, spaghetti O's.  Supper has been meatloaf (frozen dinner) with mashed potatoes. Also has tried macaroni and cheese, banana, canned fruits, pudding.  He is drinking 1 cup of honey thick water instead of giving water flush.    Denies nausea, change in bowel habits.   Medications: reviewed  Labs: no new  Anthropometrics:   Weight today in clinic 152 lb 2 oz increased from 149 lb on 3/23 2/23 148 lb 1/26 143 lb 6 oz   NUTRITION DIAGNOSIS: Inadequate oral intake improving   INTERVENTION:  Recommend decreasing [employer] [place] 1 carton per day via pump.  Will need to run at 16268mr for 2 hours (8am-10am).  Patient will need to give 1 syringe full water (36m58mhen turns pump off at 10am to clear line.   Continue prostat and water flush via feeding tube.   Patient to continue eating at meal times (breakfast, lunch and dinner) and at least 1 snack per day.  We discussed food items that he wants to try at each meal.  Patient to eat 2-3 food items at meal time (ie scrambled egg and yogurt or oatmeal and yogurt, or meatloaf, mashed potatoes and canned fruit).   Patient to switch to ensure plus for more calories and  protein 2-3 times per day.  Patient will add snack at least 1 time per day (pudding)  Discussed other fluids that he can thicken to increase hydration.  Patient has contact information     MONITORING, EVALUATION, GOAL: patient will tolerate oral foods and taper feeding while maintaining weight   NEXT VISIT: 1 month, message sent to scheduling to arrange transportaion  Vernadine Coombs B. AlleZenia Resides, [place]oisN Lucasistered Dietitian [phone])

## 2019-07-16 ENCOUNTER — Telehealth: Payer: Self-pay

## 2019-07-16 NOTE — Telephone Encounter (Signed)
Scheduled appt per 4/20 sch message - pt aware of appt

## 2019-07-16 NOTE — Progress Notes (Signed)
Logan Price presents today for 1 month follow up after completing SBRT to right and left lung on 06/13/2019 (he has also received radiation to glottis completed on 12/10/2019)  Pain issues, if any: None Using a feeding tube?: Yes, uses once a day without any difficulty. Weight changes, if any:  Wt Readings from Last 3 Encounters:  07/18/19 152 lb 2 oz (69 kg)  07/15/19 152 lb 2 oz (69 kg)  06/17/19 149 lb (67.6 kg)   Swallowing issues, if any: Met with Joli Allen-RD on 07/15/19: "Inadequate oral intake improving"; Last Dabney Porter-SLP note 07/02/2019: Dysphagia 3 (mechanical soft);Honey-thick liquid, will see again on 07/23/2019 Smoking or chewing tobacco? No Using fluoride trays daily? N/A Last ENT visit was on: Saw Dr. Leta Baptist about 4 weeks ago to have tracheostomy removed. Other notable issues, if any: Nothing of note.  Vitals:   07/18/19 1155  BP: 112/62  Pulse: 82  Resp: 18  Temp: 99.1 F (37.3 C)  TempSrc: Temporal  SpO2: 98%  Weight: 152 lb 2 oz (69 kg)  Height: 5' 9"  (1.753 m)

## 2019-07-16 NOTE — Telephone Encounter (Signed)
Called patient because I noticed he had two follow-up appointments with Dr. Isidore Moos within a week of each other, and per Dr. Isidore Moos he only needed to come to one. Patient stated he had a prior engagement on 04/30/2021so we would be prefer to keep the appointment on 07/18/2019. Informed him I would take care of canceling duplicate appointment on 07/25/2019, and we would see him on 07/18/2019. Patient verbalized understanding and agreement, and no other needs identified at this time.

## 2019-07-18 ENCOUNTER — Ambulatory Visit
Admission: RE | Admit: 2019-07-18 | Discharge: 2019-07-18 | Disposition: A | Discharge status: Home / Self Care | Payer: Medicare Other | Source: Ambulatory Visit | Attending: Radiation Oncology | Admitting: Radiation Oncology

## 2019-07-18 ENCOUNTER — Other Ambulatory Visit: Payer: Self-pay

## 2019-07-18 VITALS — BP 112/62 | HR 82 | Temp 99.1°F | Resp 18 | Ht 69.0 in | Wt 152.1 lb

## 2019-07-18 DIAGNOSIS — Z79899 Other long term (current) drug therapy: Secondary | ICD-10-CM | POA: Diagnosis not present

## 2019-07-18 DIAGNOSIS — Z85118 Personal history of other malignant neoplasm of bronchus and lung: Secondary | ICD-10-CM | POA: Diagnosis not present

## 2019-07-18 DIAGNOSIS — C3411 Malignant neoplasm of upper lobe, right bronchus or lung: Secondary | ICD-10-CM

## 2019-07-18 DIAGNOSIS — C3412 Malignant neoplasm of upper lobe, left bronchus or lung: Secondary | ICD-10-CM

## 2019-07-18 DIAGNOSIS — C3491 Malignant neoplasm of unspecified part of right bronchus or lung: Secondary | ICD-10-CM

## 2019-07-18 DIAGNOSIS — C3492 Malignant neoplasm of unspecified part of left bronchus or lung: Secondary | ICD-10-CM

## 2019-07-18 DIAGNOSIS — C320 Malignant neoplasm of glottis: Secondary | ICD-10-CM

## 2019-07-21 ENCOUNTER — Encounter: Payer: Self-pay | Admitting: Radiation Oncology

## 2019-07-21 NOTE — Progress Notes (Signed)
Radiation Oncology         (336) [phone] ________________________________  Name: Logan Price MRN: [id number]  Date: 07/18/2019  DOB: 1946/02/08  Follow-Up Visit Note  CC: Sharion Balloon, FNP  Leta Baptist, MD  Diagnosis and Prior Radiotherapy:       ICD-10-CM   1. Malignant neoplasm of right upper lobe of lung [facility])  C34.11 CT Soft Tissue Neck W Contrast    CT Chest W Contrast  2. Primary cancer of left upper lobe of lung ([place])  C34.12 CT Soft Tissue Neck W Contrast    CT Chest W Contrast  3. Adenocarcinoma, lung, left (HCC)  C34.92   4. Squamous cell lung cancer, right (HCC)  C34.91   5. 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HCC)  C34.92   6. Squamous cell carcinoma of glottis (HCC)  C32.0 CT Soft Tissue Neck W Contrast    CT Chest W Contrast    Radiation Treatment Dates: 10/22/2018 through 12/10/2018 Site Technique Total Dose Dose per Fx Completed Fx Beam Energies  Head & neck: HN_larynx IMRT 70/70 2 35/35 6X   Radiation Treatment Dates: 06/03/2019 through 06/13/2019 Site Technique Total Dose (Gy) Dose per Fx (Gy) Completed Fx Beam Energies  Lung, Right: Lung_Rt_Upper IMRT 60/60 12 5/5 6XFFF  Lung, Left: Lung_Lt_Upper IMRT 60/60 12 5/5 6XFFF    CHIEF COMPLAINT:  Here for follow-up and surveillance of throat and lung cancer   Narrative:  The patient returns today for routine follow-up. Logan Price presents today for 1 month follow up after completing SBRT to right and left lung on 06/13/2019 (he has also received radiation to glottis completed on 12/10/2019)  Pain issues, if any: None Using a feeding tube?: Yes, uses once a day without any difficulty. Weight changes, if any:  Wt Readings from Last 3 Encounters:  07/18/19 152 lb 2 oz (69 kg)  07/15/19 152 lb 2 oz (69 kg)  06/17/19 149 lb (67.6 kg)   Swallowing issues, if any: Met with Joli Allen-RD on 07/15/19: "Inadequate oral intake improving"; Last Dabney Porter-SLP note 07/02/2019: Dysphagia 3 (mechanical soft);Honey-thick liquid,  will see again on 07/23/2019 Smoking or chewing tobacco? No Using fluoride trays daily? N/A Last ENT visit was on: Saw Dr. Leta Baptist about 4 weeks ago to have tracheostomy removed.    The patient feels that this is helped his swallowing.  He presents in a wheelchair today.  ALLERGIES:  has No Known Allergies.  Meds: Current Outpatient Medications  Medication Sig Dispense Refill  . albuterol (PROVENTIL) (2.5 MG/3ML) 0.083% nebulizer solution every 6 hours as directed by provider 120 mL 12  . Amino Acids-Protein Hydrolys (FEEDING SUPPLEMENT, PRO-STAT SUGAR FREE 64,) LIQD Place 30 mLs into feeding tube 2 (two) times daily. 887 mL 15  . doxazosin (CARDURA) 1 MG tablet Place 1 tablet (1 mg total) into feeding tube daily. (Patient taking differently: Place 1 mg into feeding tube daily at 12 noon. ) 30 tablet 5  . Nutritional Supplements (KATE FARMS PEPTIDE 1.5) LIQD 325 mLs by PEG Tube route continuous. (Patient taking differently: 325 mLs by PEG Tube route 4 (four) times daily. ) 325 mL 12  . pantoprazole (PROTONIX) 40 MG tablet 1 po 30 mins prior to first meal (Patient taking differently: Take 40 mg by mouth daily. ) 90 tablet 3  . simvastatin (ZOCOR) 20 MG tablet Take 1 tablet (20 mg total) by mouth daily at 6 PM. 90 tablet 1  . Water For Irrigation, Sterile (FREE WATER) SOLN Place  Consulted  spouse   Patient will benefit from skilled therapeutic intervention in order to improve  the following deficits and impairments:  Dysphagia, pharyngeal phase Recommendations/Treatment - 06/23/19 1441    Swallow Evaluation Recommendations  SLP Diet Recommendations  Nectar;Dysphagia 2 (chopped)   Thickener user  Other (Comment)   Liquid Administration via  Cup;Spoon   Medication Administration  Whole meds with puree   Supervision  Patient able to self feed   Compensations  Small sips/bites;Multiple dry swallows after each bite/sip;Clear throat intermittently   Postural Changes  Seated upright at 90 degrees;Remain upright for at least 30 minutes after feeds/meals   Prognosis - 06/23/19 1442    Prognosis  Prognosis for Safe Diet Advancement  Good   Barriers to Reach Goals  Severity of deficits;Other (Comment)   Barriers/Prognosis Comment  long term effects of post radiation sequelae    Individuals Consulted  Consulted and Agree with Results and Recommendations  Patient;Family member/caregiver   Family Member Consulted  Spouse   Report Sent to   Referring physician   Problem List Patient Active Problem List  Diagnosis Date Noted . Malignant neoplasm of right upper lobe of lung ([place]) 05/27/2019 . Primary cancer of left upper lobe of lung ([place]) 05/27/2019 . Pseudomonas aeruginosa infection 05/21/2019 . Tracheobronchitis 05/21/2019 . Hx of radiation therapy 05/21/2019 . 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[place]) 05/21/2019 . Squamous cell lung cancer, right ([place]) 05/21/2019 . Adenocarcinoma, lung, left ([place]) 05/21/2019 . Lung nodule 04/23/2019 . Ground glass opacity present on imaging of lung 04/23/2019 . 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 Tracheostomy tube present ([place]) 04/23/2019 . PEG (percutaneous endoscopic gastrostomy) adjustment/replacement/removal ([place]) 03/08/2019 . 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 . Pleural effusion  . 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[place])  . Bacteremia  . Leukocytosis  . Normocytic anemia  . Protein-calorie malnutrition, severe 10/08/2018 . PSVT (paroxysmal supraventricular tachycardia) ([place])  10/03/2018 . Atrial fibrillation ([place]) 10/03/2018 . Goals of care, counseling/discussion  . Dysphagia  . Loss of weight  . Squamous cell carcinoma of glottis ([place]) 09/27/2018 . Gastritis determined by endoscopy  . Esophageal dysphagia  . 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 Globus sensation 12/12/2016 . Smokes with greater than 40 pack year history 10/20/2016 . Hemorrhoids, internal, with bleeding 10/06/2015 . History of adenomatous polyp of colon 04/14/2015 . Vitamin D deficiency 10/09/2014 . HLD (hyperlipidemia) 10/08/2012 . GERD (gastroesophageal reflux disease) 10/08/2012 . HTN (hypertension)  . Diverticulitis 10/25/2009 Thank you, Genene Churn, [facility] 06/23/2019, 2:47 PM [place] [address] [place], [name], [address] Phone: [phone]   Fax:  [phone] Name: KENNETH CUARESMA MRN: [id number] Date of Birth: 10/30/1945 CLINICAL DATA:  Dysphagia. Cough/GE reflux disease/other secondary diagnosis EXAM: MODIFIED BARIUM SWALLOW TECHNIQUE: Different consistencies of barium were administered orally to the patient by the Speech Pathologist. Imaging of the pharynx was performed in the lateral projection. The radiologist was present in the fluoroscopy room for this study, providing personal supervision. FLUOROSCOPY TIME:  Fluoroscopy Time:  6 minutes 0 seconds Radiation Exposure Index (if provided by the fluoroscopic device): 43.2 mGy Number of Acquired Spot Images: multiple fluoroscopic screen captures COMPARISON:  None FINDINGS: Laryngeal penetration occurred throughout the exam when evaluating thin barium, nectar consistency and honey consistency. This included direct laryngeal penetration as well as penetration from residuals at the hypopharynx and piriform sinuses. Of contrast below the vocal cords was identified with presence of a spontaneous cough reflex. This occurred primarily with thin barium. Minimal vallecular  200 mLs into feeding tube every 4 (four) hours.     No current facility-administered medications for this encounter.    Physical Findings: The patient is in no acute distress. Patient is alert and oriented. Wt Readings from Last 3 Encounters:  07/18/19 152 lb 2 oz (69 kg)  07/15/19 152 lb 2 oz (69 kg)  06/17/19 149 lb (67.6 kg)    height is _0  (1.753 m) and weight is 152 lb 2 oz (69 kg). His temporal temperature is 99.1 F (37.3 C). His blood pressure is 112/62 and his pulse is 82. His respiration is 18 and oxygen saturation is 98%. .  General: Alert and oriented, in no acute distress.  He presents in a  wheelchair.  No respiratory distress HEENT: Head is normocephalic. Extraocular movements are intact. Oropharynx is notable for no thrush or lesions Neck:  bandage removed from trach site, tissue healing well status post removal ; no palpable adenopathy Lymphatics: see Neck Exam Psychiatric: Judgment and insight are intact. Affect is appropriate.   Lab Findings: Lab Results  Component Value Date   WBC 10.1 04/30/2019   HGB 11.8 (L) 04/30/2019   HCT 37.7 (L) 04/30/2019   MCV 90.4 04/30/2019   PLT 212 04/30/2019    Lab Results  Component Value Date   TSH 3.910 04/09/2019    Radiographic Findings: DG OP Swallowing Func-Medicare/Speech Path  Result Date: 06/23/2019 [facility] [address] [place], [name], [address] Phone: [phone]   Fax:  [phone] Modified Barium Swallow Patient Details Name: Logan Price MRN: [id number] Date of Birth: 1945-07-01 Referring Provider (SLP): Eppie Gibson, MD Encounter Date: 06/23/2019 End of Session - 06/23/19 1443   Visit Number  1   Number of Visits  1   Authorization Type  Medicare   SLP Start Time  1130   SLP Stop Time   8264   SLP Time Calculation (min)  35 min   Activity Tolerance  Patient tolerated treatment well   Past Medical History: Diagnosis Date . Diverticulitis  . Dysrhythmia 09/2018  episode of SVT while in hospital . 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 GERD (gastroesophageal reflux disease)  . 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  trach . HTN (hypertension)  . Hyperlipidemia  Past Surgical History: Procedure Laterality Date . BIOPSY  05/04/2015  Procedure: BIOPSY;  Surgeon: Danie Binder, MD;  [facility];  Service: Endoscopy;;  bx's of ileocecal valve  . BRONCHIAL BRUSHINGS  04/29/2019  Procedure: BRONCHIAL BRUSHINGS;  Surgeon: Garner Nash, DO;  Location: [facility];  Service: Pulmonary;; . BRONCHIAL NEEDLE ASPIRATION BIOPSY  04/29/2019  Procedure: BRONCHIAL NEEDLE ASPIRATION  BIOPSIES;  Surgeon: Garner Nash, DO;  Location: [facility];  Service: Pulmonary;; . BRONCHIAL WASHINGS  04/29/2019  Procedure: BRONCHIAL WASHINGS;  Surgeon: Garner Nash, DO;  [facility];  Service: Pulmonary;; . COLONOSCOPY WITH PROPOFOL N/A 05/04/2015  Dr. Oneida Alar: normal appearing ileum with prominent IC valve with tubular adenomas, moderate diverticulosis in sigmoid colon, ascending colon, and retum. Moderate sized internal hemorrhoids. Surveillance in 5 years . ELECTROMAGNETIC NAVIGATION BROCHOSCOPY  04/29/2019  Procedure: NAVIGATION BRONCHOSCOPY;  Surgeon: Garner Nash, DO;  Location: [facility];  Service: Pulmonary;; . ESOPHAGOGASTRODUODENOSCOPY (EGD) WITH PROPOFOL N/A 12/19/2016  Procedure: ESOPHAGOGASTRODUODENOSCOPY (EGD) WITH PROPOFOL;  Surgeon: Danie Binder, MD;  [facility];  Service: Endoscopy;  Laterality: N/A;  11:30am . FLEXIBLE SIGMOIDOSCOPY N/A 12/10/2015  hemorrhoid banding X 3  .  200 mLs into feeding tube every 4 (four) hours.     No current facility-administered medications for this encounter.    Physical Findings: The patient is in no acute distress. Patient is alert and oriented. Wt Readings from Last 3 Encounters:  07/18/19 152 lb 2 oz (69 kg)  07/15/19 152 lb 2 oz (69 kg)  06/17/19 149 lb (67.6 kg)    height is _0  (1.753 m) and weight is 152 lb 2 oz (69 kg). His temporal temperature is 99.1 F (37.3 C). His blood pressure is 112/62 and his pulse is 82. His respiration is 18 and oxygen saturation is 98%. .  General: Alert and oriented, in no acute distress.  He presents in a  wheelchair.  No respiratory distress HEENT: Head is normocephalic. Extraocular movements are intact. Oropharynx is notable for no thrush or lesions Neck:  bandage removed from trach site, tissue healing well status post removal ; no palpable adenopathy Lymphatics: see Neck Exam Psychiatric: Judgment and insight are intact. Affect is appropriate.   Lab Findings: Lab Results  Component Value Date   WBC 10.1 04/30/2019   HGB 11.8 (L) 04/30/2019   HCT 37.7 (L) 04/30/2019   MCV 90.4 04/30/2019   PLT 212 04/30/2019    Lab Results  Component Value Date   TSH 3.910 04/09/2019    Radiographic Findings: DG OP Swallowing Func-Medicare/Speech Path  Result Date: 06/23/2019 [facility] [address] [place], [name], [address] Phone: [phone]   Fax:  [phone] Modified Barium Swallow Patient Details Name: Logan Price MRN: [id number] Date of Birth: 1945-07-01 Referring Provider (SLP): Eppie Gibson, MD Encounter Date: 06/23/2019 End of Session - 06/23/19 1443   Visit Number  1   Number of Visits  1   Authorization Type  Medicare   SLP Start Time  1130   SLP Stop Time   8264   SLP Time Calculation (min)  35 min   Activity Tolerance  Patient tolerated treatment well   Past Medical History: Diagnosis Date . Diverticulitis  . Dysrhythmia 09/2018  episode of SVT while in hospital . 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 GERD (gastroesophageal reflux disease)  . 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  trach . HTN (hypertension)  . Hyperlipidemia  Past Surgical History: Procedure Laterality Date . BIOPSY  05/04/2015  Procedure: BIOPSY;  Surgeon: Danie Binder, MD;  [facility];  Service: Endoscopy;;  bx's of ileocecal valve  . BRONCHIAL BRUSHINGS  04/29/2019  Procedure: BRONCHIAL BRUSHINGS;  Surgeon: Garner Nash, DO;  Location: [facility];  Service: Pulmonary;; . BRONCHIAL NEEDLE ASPIRATION BIOPSY  04/29/2019  Procedure: BRONCHIAL NEEDLE ASPIRATION  BIOPSIES;  Surgeon: Garner Nash, DO;  Location: [facility];  Service: Pulmonary;; . BRONCHIAL WASHINGS  04/29/2019  Procedure: BRONCHIAL WASHINGS;  Surgeon: Garner Nash, DO;  [facility];  Service: Pulmonary;; . COLONOSCOPY WITH PROPOFOL N/A 05/04/2015  Dr. Oneida Alar: normal appearing ileum with prominent IC valve with tubular adenomas, moderate diverticulosis in sigmoid colon, ascending colon, and retum. Moderate sized internal hemorrhoids. Surveillance in 5 years . ELECTROMAGNETIC NAVIGATION BROCHOSCOPY  04/29/2019  Procedure: NAVIGATION BRONCHOSCOPY;  Surgeon: Garner Nash, DO;  Location: [facility];  Service: Pulmonary;; . ESOPHAGOGASTRODUODENOSCOPY (EGD) WITH PROPOFOL N/A 12/19/2016  Procedure: ESOPHAGOGASTRODUODENOSCOPY (EGD) WITH PROPOFOL;  Surgeon: Danie Binder, MD;  [facility];  Service: Endoscopy;  Laterality: N/A;  11:30am . FLEXIBLE SIGMOIDOSCOPY N/A 12/10/2015  hemorrhoid banding X 3  .  200 mLs into feeding tube every 4 (four) hours.     No current facility-administered medications for this encounter.    Physical Findings: The patient is in no acute distress. Patient is alert and oriented. Wt Readings from Last 3 Encounters:  07/18/19 152 lb 2 oz (69 kg)  07/15/19 152 lb 2 oz (69 kg)  06/17/19 149 lb (67.6 kg)    height is _0  (1.753 m) and weight is 152 lb 2 oz (69 kg). His temporal temperature is 99.1 F (37.3 C). His blood pressure is 112/62 and his pulse is 82. His respiration is 18 and oxygen saturation is 98%. .  General: Alert and oriented, in no acute distress.  He presents in a  wheelchair.  No respiratory distress HEENT: Head is normocephalic. Extraocular movements are intact. Oropharynx is notable for no thrush or lesions Neck:  bandage removed from trach site, tissue healing well status post removal ; no palpable adenopathy Lymphatics: see Neck Exam Psychiatric: Judgment and insight are intact. Affect is appropriate.   Lab Findings: Lab Results  Component Value Date   WBC 10.1 04/30/2019   HGB 11.8 (L) 04/30/2019   HCT 37.7 (L) 04/30/2019   MCV 90.4 04/30/2019   PLT 212 04/30/2019    Lab Results  Component Value Date   TSH 3.910 04/09/2019    Radiographic Findings: DG OP Swallowing Func-Medicare/Speech Path  Result Date: 06/23/2019 [facility] [address] [place], [name], [address] Phone: [phone]   Fax:  [phone] Modified Barium Swallow Patient Details Name: Logan Price MRN: [id number] Date of Birth: 1945-07-01 Referring Provider (SLP): Eppie Gibson, MD Encounter Date: 06/23/2019 End of Session - 06/23/19 1443   Visit Number  1   Number of Visits  1   Authorization Type  Medicare   SLP Start Time  1130   SLP Stop Time   8264   SLP Time Calculation (min)  35 min   Activity Tolerance  Patient tolerated treatment well   Past Medical History: Diagnosis Date . Diverticulitis  . Dysrhythmia 09/2018  episode of SVT while in hospital . 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 GERD (gastroesophageal reflux disease)  . 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  trach . HTN (hypertension)  . Hyperlipidemia  Past Surgical History: Procedure Laterality Date . BIOPSY  05/04/2015  Procedure: BIOPSY;  Surgeon: Danie Binder, MD;  [facility];  Service: Endoscopy;;  bx's of ileocecal valve  . BRONCHIAL BRUSHINGS  04/29/2019  Procedure: BRONCHIAL BRUSHINGS;  Surgeon: Garner Nash, DO;  Location: [facility];  Service: Pulmonary;; . BRONCHIAL NEEDLE ASPIRATION BIOPSY  04/29/2019  Procedure: BRONCHIAL NEEDLE ASPIRATION  BIOPSIES;  Surgeon: Garner Nash, DO;  Location: [facility];  Service: Pulmonary;; . BRONCHIAL WASHINGS  04/29/2019  Procedure: BRONCHIAL WASHINGS;  Surgeon: Garner Nash, DO;  [facility];  Service: Pulmonary;; . COLONOSCOPY WITH PROPOFOL N/A 05/04/2015  Dr. Oneida Alar: normal appearing ileum with prominent IC valve with tubular adenomas, moderate diverticulosis in sigmoid colon, ascending colon, and retum. Moderate sized internal hemorrhoids. Surveillance in 5 years . ELECTROMAGNETIC NAVIGATION BROCHOSCOPY  04/29/2019  Procedure: NAVIGATION BRONCHOSCOPY;  Surgeon: Garner Nash, DO;  Location: [facility];  Service: Pulmonary;; . ESOPHAGOGASTRODUODENOSCOPY (EGD) WITH PROPOFOL N/A 12/19/2016  Procedure: ESOPHAGOGASTRODUODENOSCOPY (EGD) WITH PROPOFOL;  Surgeon: Danie Binder, MD;  [facility];  Service: Endoscopy;  Laterality: N/A;  11:30am . FLEXIBLE SIGMOIDOSCOPY N/A 12/10/2015  hemorrhoid banding X 3  .  Radiation Oncology         (336) [phone] ________________________________  Name: Logan Price MRN: [id number]  Date: 07/18/2019  DOB: 1946/02/08  Follow-Up Visit Note  CC: Sharion Balloon, FNP  Leta Baptist, MD  Diagnosis and Prior Radiotherapy:       ICD-10-CM   1. Malignant neoplasm of right upper lobe of lung [facility])  C34.11 CT Soft Tissue Neck W Contrast    CT Chest W Contrast  2. Primary cancer of left upper lobe of lung ([place])  C34.12 CT Soft Tissue Neck W Contrast    CT Chest W Contrast  3. Adenocarcinoma, lung, left (HCC)  C34.92   4. Squamous cell lung cancer, right (HCC)  C34.91   5. 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HCC)  C34.92   6. Squamous cell carcinoma of glottis ([facility])  C32.0 CT Soft Tissue Neck W Contrast    CT Chest W Contrast    Radiation Treatment Dates: 10/22/2018 through 12/10/2018 Site Technique Total Dose Dose per Fx Completed Fx Beam Energies  Head & neck: HN_larynx IMRT 70/70 2 35/35 6X   Radiation Treatment Dates: 06/03/2019 through 06/13/2019 Site Technique Total Dose (Gy) Dose per Fx (Gy) Completed Fx Beam Energies  Lung, Right: Lung_Rt_Upper IMRT 60/60 12 5/5 6XFFF  Lung, Left: Lung_Lt_Upper IMRT 60/60 12 5/5 6XFFF    CHIEF COMPLAINT:  Here for follow-up and surveillance of throat and lung cancer   Narrative:  The patient returns today for routine follow-up. Logan Price presents today for 1 month follow up after completing SBRT to right and left lung on 06/13/2019 (he has also received radiation to glottis completed on 12/10/2019)  Pain issues, if any: None Using a feeding tube?: Yes, uses once a day without any difficulty. Weight changes, if any:  Wt Readings from Last 3 Encounters:  07/18/19 152 lb 2 oz (69 kg)  07/15/19 152 lb 2 oz (69 kg)  06/17/19 149 lb (67.6 kg)   Swallowing issues, if any: Met with Joli Allen-RD on 07/15/19: "Inadequate oral intake improving"; Last Dabney Porter-SLP note 07/02/2019: Dysphagia 3 (mechanical soft);Honey-thick liquid,  will see again on 07/23/2019 Smoking or chewing tobacco? No Using fluoride trays daily? N/A Last ENT visit was on: Saw Dr. Leta Baptist about 4 weeks ago to have tracheostomy removed.    The patient feels that this is helped his swallowing.  He presents in a wheelchair today.  ALLERGIES:  has No Known Allergies.  Meds: Current Outpatient Medications  Medication Sig Dispense Refill  . albuterol (PROVENTIL) (2.5 MG/3ML) 0.083% nebulizer solution every 6 hours as directed by provider 120 mL 12  . Amino Acids-Protein Hydrolys (FEEDING SUPPLEMENT, PRO-STAT SUGAR FREE 64,) LIQD Place 30 mLs into feeding tube 2 (two) times daily. 887 mL 15  . doxazosin (CARDURA) 1 MG tablet Place 1 tablet (1 mg total) into feeding tube daily. (Patient taking differently: Place 1 mg into feeding tube daily at 12 noon. ) 30 tablet 5  . Nutritional Supplements (KATE FARMS PEPTIDE 1.5) LIQD 325 mLs by PEG Tube route continuous. (Patient taking differently: 325 mLs by PEG Tube route 4 (four) times daily. ) 325 mL 12  . pantoprazole (PROTONIX) 40 MG tablet 1 po 30 mins prior to first meal (Patient taking differently: Take 40 mg by mouth daily. ) 90 tablet 3  . simvastatin (ZOCOR) 20 MG tablet Take 1 tablet (20 mg total) by mouth daily at 6 PM. 90 tablet 1  . Water For Irrigation, Sterile (FREE WATER) SOLN Place  Radiation Oncology         (336) [phone] ________________________________  Name: Logan Price MRN: [id number]  Date: 07/18/2019  DOB: 1946/02/08  Follow-Up Visit Note  CC: Sharion Balloon, FNP  Leta Baptist, MD  Diagnosis and Prior Radiotherapy:       ICD-10-CM   1. Malignant neoplasm of right upper lobe of lung [facility]  C34.11 CT Soft Tissue Neck W Contrast    CT Chest W Contrast  2. Primary cancer of left upper lobe of lung ([place])  C34.12 CT Soft Tissue Neck W Contrast    CT Chest W Contrast  3. Adenocarcinoma, lung, left (HCC)  C34.92   4. Squamous cell lung cancer, right (HCC)  C34.91   5. 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HCC)  C34.92   6. Squamous cell carcinoma of glottis (HCC)  C32.0 CT Soft Tissue Neck W Contrast    CT Chest W Contrast    Radiation Treatment Dates: 10/22/2018 through 12/10/2018 Site Technique Total Dose Dose per Fx Completed Fx Beam Energies  Head & neck: HN_larynx IMRT 70/70 2 35/35 6X   Radiation Treatment Dates: 06/03/2019 through 06/13/2019 Site Technique Total Dose (Gy) Dose per Fx (Gy) Completed Fx Beam Energies  Lung, Right: Lung_Rt_Upper IMRT 60/60 12 5/5 6XFFF  Lung, Left: Lung_Lt_Upper IMRT 60/60 12 5/5 6XFFF    CHIEF COMPLAINT:  Here for follow-up and surveillance of throat and lung cancer   Narrative:  The patient returns today for routine follow-up. Logan Price presents today for 1 month follow up after completing SBRT to right and left lung on 06/13/2019 (he has also received radiation to glottis completed on 12/10/2019)  Pain issues, if any: None Using a feeding tube?: Yes, uses once a day without any difficulty. Weight changes, if any:  Wt Readings from Last 3 Encounters:  07/18/19 152 lb 2 oz (69 kg)  07/15/19 152 lb 2 oz (69 kg)  06/17/19 149 lb (67.6 kg)   Swallowing issues, if any: Met with Joli Allen-RD on 07/15/19: "Inadequate oral intake improving"; Last Dabney Porter-SLP note 07/02/2019: Dysphagia 3 (mechanical soft);Honey-thick liquid,  will see again on 07/23/2019 Smoking or chewing tobacco? No Using fluoride trays daily? N/A Last ENT visit was on: Saw Dr. Leta Baptist about 4 weeks ago to have tracheostomy removed.    The patient feels that this is helped his swallowing.  He presents in a wheelchair today.  ALLERGIES:  has No Known Allergies.  Meds: Current Outpatient Medications  Medication Sig Dispense Refill  . albuterol (PROVENTIL) (2.5 MG/3ML) 0.083% nebulizer solution every 6 hours as directed by provider 120 mL 12  . Amino Acids-Protein Hydrolys (FEEDING SUPPLEMENT, PRO-STAT SUGAR FREE 64,) LIQD Place 30 mLs into feeding tube 2 (two) times daily. 887 mL 15  . doxazosin (CARDURA) 1 MG tablet Place 1 tablet (1 mg total) into feeding tube daily. (Patient taking differently: Place 1 mg into feeding tube daily at 12 noon. ) 30 tablet 5  . Nutritional Supplements (KATE FARMS PEPTIDE 1.5) LIQD 325 mLs by PEG Tube route continuous. (Patient taking differently: 325 mLs by PEG Tube route 4 (four) times daily. ) 325 mL 12  . pantoprazole (PROTONIX) 40 MG tablet 1 po 30 mins prior to first meal (Patient taking differently: Take 40 mg by mouth daily. ) 90 tablet 3  . simvastatin (ZOCOR) 20 MG tablet Take 1 tablet (20 mg total) by mouth daily at 6 PM. 90 tablet 1  . Water For Irrigation, Sterile (FREE WATER) SOLN Place

## 2019-07-22 ENCOUNTER — Telehealth: Payer: Self-pay | Admitting: *Deleted

## 2019-07-22 NOTE — Telephone Encounter (Signed)
CALLED PATIENT TO ASK ABOUT COMING FOR LABS ON 09-19-19, PATIENT AGREED TO COME IN @ 1:30 PM ON 09-19-19

## 2019-07-23 ENCOUNTER — Encounter (HOSPITAL_COMMUNITY): Payer: Self-pay | Admitting: Speech Pathology

## 2019-07-23 ENCOUNTER — Ambulatory Visit (HOSPITAL_COMMUNITY): Payer: Medicare Other | Admitting: Speech Pathology

## 2019-07-23 ENCOUNTER — Other Ambulatory Visit: Payer: Self-pay

## 2019-07-23 DIAGNOSIS — R1313 Dysphagia, pharyngeal phase: Secondary | ICD-10-CM

## 2019-07-23 NOTE — Therapy (Signed)
[place], [name], [address] Phone: [phone]   Fax:  [phone]  Speech Language Pathology Treatment  Patient Details  Name: Logan Price MRN: [id number] Date of Birth: 1945/08/17 Referring Provider (SLP): Eppie Gibson, MD   Encounter Date: 07/23/2019  End of Session - 07/23/19 1049    Visit Number  9    Number of Visits  12    Date for SLP Re-Evaluation  09/04/19    Authorization Type  Medicare    SLP Start Time  1035    SLP Stop Time   1115    SLP Time Calculation (min)  40 min    Activity Tolerance  Patient tolerated treatment well       Past Medical History:  Diagnosis Date  . Diverticulitis   . Dysrhythmia 09/2018   episode of SVT while in hospital  . 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 GERD (gastroesophageal reflux disease)   . 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 HTN (hypertension)   . Hyperlipidemia     Past Surgical History:  Procedure Laterality Date  . BIOPSY  05/04/2015   Procedure: BIOPSY;  Surgeon: Danie Binder, MD;  [facility];  Service: Endoscopy;;  bx's of ileocecal valve   . BRONCHIAL BRUSHINGS  04/29/2019   Procedure: BRONCHIAL BRUSHINGS;  Surgeon: Garner Nash, DO;  Location: [facility];  Service: Pulmonary;;  . BRONCHIAL NEEDLE ASPIRATION BIOPSY  04/29/2019   Procedure: BRONCHIAL NEEDLE ASPIRATION BIOPSIES;  Surgeon: Garner Nash, DO;  Location: [facility];  Service: Pulmonary;;  . BRONCHIAL WASHINGS  04/29/2019   Procedure: BRONCHIAL WASHINGS;  Surgeon: Garner Nash, DO;  [facility];  Service: Pulmonary;;  . COLONOSCOPY WITH PROPOFOL N/A 05/04/2015   Dr. Oneida Alar: normal appearing ileum with prominent IC valve with tubular adenomas, moderate diverticulosis in sigmoid colon, ascending colon, and retum. Moderate sized internal hemorrhoids. Surveillance in 5 years  . ELECTROMAGNETIC NAVIGATION BROCHOSCOPY  04/29/2019   Procedure: NAVIGATION BRONCHOSCOPY;  Surgeon: Garner Nash, DO;  Location: [facility];  Service: Pulmonary;;  . ESOPHAGOGASTRODUODENOSCOPY (EGD) WITH PROPOFOL N/A 12/19/2016   Procedure: ESOPHAGOGASTRODUODENOSCOPY (EGD) WITH PROPOFOL;  Surgeon: Danie Binder, MD;  [facility];  Service: Endoscopy;  Laterality: N/A;  11:30am  . FLEXIBLE SIGMOIDOSCOPY N/A 12/10/2015   hemorrhoid banding X 3   . HEMORRHOID BANDING N/A 12/10/2015   Procedure: HEMORRHOID BANDING;  Surgeon: Danie Binder, MD;  [facility];  Service: Endoscopy;  Laterality: N/A;  1:30 PM  . INSERTION OF SUPRAPUBIC CATHETER N/A 02/24/2019   Procedure: INSERTION OF SUPRAPUBIC CATHETER;  Surgeon: Kathie Rhodes, MD;  Location: [facility];  Service: Urology;  Laterality: N/A;  . IR CM INJ ANY COLONIC TUBE W/FLUORO  10/30/2018  . IR GASTROSTOMY TUBE MOD SED  10/04/2018  . IR IMAGING GUIDED PORT INSERTION  10/04/2018  . IR REMOVAL TUN ACCESS W/ PORT W/O FL MOD SED  10/14/2018  . IR REPLACE G-TUBE SIMPLE WO FLUORO  11/03/2018  . LAPAROSCOPIC INSERTION GASTROSTOMY TUBE Left 10/07/2018   Procedure: LAPAROSCOPIC  GASTROSTOMY TUBE;  Surgeon: Kinsinger, Arta Bruce, MD;  Location: [facility];  Service: General;  Laterality: Left;  Marland Kitchen MICROLARYNGOSCOPY N/A 09/27/2018   Procedure: MICRO DIRECT LARYNGOSCOPY WITH BIOPSY;  Surgeon: Leta Baptist, MD;  Location: [facility];  Service: ENT;  Laterality: N/A;  . None to date     As of 04/14/15  .  [place], [name], [address] Phone: [phone]   Fax:  [phone]  Speech Language Pathology Treatment  Patient Details  Name: Logan Price MRN: [id number] Date of Birth: 1945/08/17 Referring Provider (SLP): Eppie Gibson, MD   Encounter Date: 07/23/2019  End of Session - 07/23/19 1049    Visit Number  9    Number of Visits  12    Date for SLP Re-Evaluation  09/04/19    Authorization Type  Medicare    SLP Start Time  1035    SLP Stop Time   1115    SLP Time Calculation (min)  40 min    Activity Tolerance  Patient tolerated treatment well       Past Medical History:  Diagnosis Date  . Diverticulitis   . Dysrhythmia 09/2018   episode of SVT while in hospital  . 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 GERD (gastroesophageal reflux disease)   . 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 HTN (hypertension)   . Hyperlipidemia     Past Surgical History:  Procedure Laterality Date  . BIOPSY  05/04/2015   Procedure: BIOPSY;  Surgeon: Danie Binder, MD;  [facility];  Service: Endoscopy;;  bx's of ileocecal valve   . BRONCHIAL BRUSHINGS  04/29/2019   Procedure: BRONCHIAL BRUSHINGS;  Surgeon: Garner Nash, DO;  Location: [facility];  Service: Pulmonary;;  . BRONCHIAL NEEDLE ASPIRATION BIOPSY  04/29/2019   Procedure: BRONCHIAL NEEDLE ASPIRATION BIOPSIES;  Surgeon: Garner Nash, DO;  Location: [facility];  Service: Pulmonary;;  . BRONCHIAL WASHINGS  04/29/2019   Procedure: BRONCHIAL WASHINGS;  Surgeon: Garner Nash, DO;  [facility];  Service: Pulmonary;;  . COLONOSCOPY WITH PROPOFOL N/A 05/04/2015   Dr. Oneida Alar: normal appearing ileum with prominent IC valve with tubular adenomas, moderate diverticulosis in sigmoid colon, ascending colon, and retum. Moderate sized internal hemorrhoids. Surveillance in 5 years  . ELECTROMAGNETIC NAVIGATION BROCHOSCOPY  04/29/2019   Procedure: NAVIGATION BRONCHOSCOPY;  Surgeon: Garner Nash, DO;  Location: [facility];  Service: Pulmonary;;  . ESOPHAGOGASTRODUODENOSCOPY (EGD) WITH PROPOFOL N/A 12/19/2016   Procedure: ESOPHAGOGASTRODUODENOSCOPY (EGD) WITH PROPOFOL;  Surgeon: Danie Binder, MD;  [facility];  Service: Endoscopy;  Laterality: N/A;  11:30am  . FLEXIBLE SIGMOIDOSCOPY N/A 12/10/2015   hemorrhoid banding X 3   . HEMORRHOID BANDING N/A 12/10/2015   Procedure: HEMORRHOID BANDING;  Surgeon: Danie Binder, MD;  [facility];  Service: Endoscopy;  Laterality: N/A;  1:30 PM  . INSERTION OF SUPRAPUBIC CATHETER N/A 02/24/2019   Procedure: INSERTION OF SUPRAPUBIC CATHETER;  Surgeon: Kathie Rhodes, MD;  [facility];  Service: Urology;  Laterality: N/A;  . IR CM INJ ANY COLONIC TUBE W/FLUORO  10/30/2018  . IR GASTROSTOMY TUBE MOD SED  10/04/2018  . IR IMAGING GUIDED PORT INSERTION  10/04/2018  . IR REMOVAL TUN ACCESS W/ PORT W/O FL MOD SED  10/14/2018  . IR REPLACE G-TUBE SIMPLE WO FLUORO  11/03/2018  . LAPAROSCOPIC INSERTION GASTROSTOMY TUBE Left 10/07/2018   Procedure: LAPAROSCOPIC  GASTROSTOMY TUBE;  Surgeon: Kinsinger, Arta Bruce, MD;  Location: [facility];  Service: General;  Laterality: Left;  Marland Kitchen MICROLARYNGOSCOPY N/A 09/27/2018   Procedure: MICRO DIRECT LARYNGOSCOPY WITH BIOPSY;  Surgeon: Leta Baptist, MD;  Location: [facility];  Service: ENT;  Laterality: N/A;  . None to date     As of 04/14/15  .  [place], [name], [address] Phone: [phone]   Fax:  [phone]  Speech Language Pathology Treatment  Patient Details  Name: Logan Price MRN: [id number] Date of Birth: 1945/08/17 Referring Provider (SLP): Eppie Gibson, MD   Encounter Date: 07/23/2019  End of Session - 07/23/19 1049    Visit Number  9    Number of Visits  12    Date for SLP Re-Evaluation  09/04/19    Authorization Type  Medicare    SLP Start Time  1035    SLP Stop Time   1115    SLP Time Calculation (min)  40 min    Activity Tolerance  Patient tolerated treatment well       Past Medical History:  Diagnosis Date  . Diverticulitis   . Dysrhythmia 09/2018   episode of SVT while in hospital  . 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 GERD (gastroesophageal reflux disease)   . 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 HTN (hypertension)   . Hyperlipidemia     Past Surgical History:  Procedure Laterality Date  . BIOPSY  05/04/2015   Procedure: BIOPSY;  Surgeon: Danie Binder, MD;  [facility];  Service: Endoscopy;;  bx's of ileocecal valve   . BRONCHIAL BRUSHINGS  04/29/2019   Procedure: BRONCHIAL BRUSHINGS;  Surgeon: Garner Nash, DO;  Location: [facility];  Service: Pulmonary;;  . BRONCHIAL NEEDLE ASPIRATION BIOPSY  04/29/2019   Procedure: BRONCHIAL NEEDLE ASPIRATION BIOPSIES;  Surgeon: Garner Nash, DO;  Location: [facility];  Service: Pulmonary;;  . BRONCHIAL WASHINGS  04/29/2019   Procedure: BRONCHIAL WASHINGS;  Surgeon: Garner Nash, DO;  [facility];  Service: Pulmonary;;  . COLONOSCOPY WITH PROPOFOL N/A 05/04/2015   Dr. Oneida Alar: normal appearing ileum with prominent IC valve with tubular adenomas, moderate diverticulosis in sigmoid colon, ascending colon, and retum. Moderate sized internal hemorrhoids. Surveillance in 5 years  . ELECTROMAGNETIC NAVIGATION BROCHOSCOPY  04/29/2019   Procedure: NAVIGATION BRONCHOSCOPY;  Surgeon: Garner Nash, DO;  Location: [facility];  Service: Pulmonary;;  . ESOPHAGOGASTRODUODENOSCOPY (EGD) WITH PROPOFOL N/A 12/19/2016   Procedure: ESOPHAGOGASTRODUODENOSCOPY (EGD) WITH PROPOFOL;  Surgeon: Danie Binder, MD;  [facility];  Service: Endoscopy;  Laterality: N/A;  11:30am  . FLEXIBLE SIGMOIDOSCOPY N/A 12/10/2015   hemorrhoid banding X 3   . HEMORRHOID BANDING N/A 12/10/2015   Procedure: HEMORRHOID BANDING;  Surgeon: Danie Binder, MD;  [facility];  Service: Endoscopy;  Laterality: N/A;  1:30 PM  . INSERTION OF SUPRAPUBIC CATHETER N/A 02/24/2019   Procedure: INSERTION OF SUPRAPUBIC CATHETER;  Surgeon: Kathie Rhodes, MD;  [facility];  Service: Urology;  Laterality: N/A;  . IR CM INJ ANY COLONIC TUBE W/FLUORO  10/30/2018  . IR GASTROSTOMY TUBE MOD SED  10/04/2018  . IR IMAGING GUIDED PORT INSERTION  10/04/2018  . IR REMOVAL TUN ACCESS W/ PORT W/O FL MOD SED  10/14/2018  . IR REPLACE G-TUBE SIMPLE WO FLUORO  11/03/2018  . LAPAROSCOPIC INSERTION GASTROSTOMY TUBE Left 10/07/2018   Procedure: LAPAROSCOPIC  GASTROSTOMY TUBE;  Surgeon: Kinsinger, Arta Bruce, MD;  Location: [facility];  Service: General;  Laterality: Left;  Marland Kitchen MICROLARYNGOSCOPY N/A 09/27/2018   Procedure: MICRO DIRECT LARYNGOSCOPY WITH BIOPSY;  Surgeon: Leta Baptist, MD;  Location: [facility];  Service: ENT;  Laterality: N/A;  . None to date     As of 04/14/15  .  Goals  Fair    Potential Considerations  Severity of impairments    SLP Home Exercise Plan  Pt will completed HEP as assigned to facilitate carryover of treatment strategies in home environment    Consulted and Agree with Plan of Care  Patient;Family member/caregiver    Family Member Consulted  spouse       Patient will benefit from skilled therapeutic  intervention in order to improve the following deficits and impairments:   Dysphagia, pharyngeal phase    Problem List Patient Active Problem List   Diagnosis Date Noted  . Malignant neoplasm of right upper lobe of lung ([place]) 05/27/2019  . Primary cancer of left upper lobe of lung ([place]) 05/27/2019  . Pseudomonas aeruginosa infection 05/21/2019  . Tracheobronchitis 05/21/2019  . Hx of radiation therapy 05/21/2019  . 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 Squamous cell lung cancer, right ([place]) 05/21/2019  . Adenocarcinoma, lung, left ([place]) 05/21/2019  . Lung nodule 04/23/2019  . Ground glass opacity present on imaging of lung 04/23/2019  . Head and neck cancer ([place]) 04/23/2019  . Tracheostomy tube present ([place]) 04/23/2019  . PEG (percutaneous endoscopic gastrostomy) adjustment/replacement/removal ([place]) 03/08/2019  . 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 Pleural effusion   . 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[place])   . Bacteremia   . Leukocytosis   . Normocytic anemia   . Protein-calorie malnutrition, severe 10/08/2018  . PSVT (paroxysmal supraventricular tachycardia) ([place]) 10/03/2018  . Atrial fibrillation ([place]) 10/03/2018  . Goals of care, counseling/discussion   . Dysphagia   . Loss of weight   . 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 Gastritis determined by endoscopy   . Esophageal dysphagia   . 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 Globus sensation 12/12/2016  . Smokes with greater than 40 pack year history 10/20/2016  . Hemorrhoids, internal, with bleeding 10/06/2015  . History of adenomatous polyp of colon 04/14/2015  . Vitamin D deficiency 10/09/2014  . HLD (hyperlipidemia) 10/08/2012  . GERD (gastroesophageal reflux disease) 10/08/2012  . HTN (hypertension)   . Diverticulitis 10/25/2009   Thank you,  Genene Churn, [place]  [facility] 07/23/2019, 11:32 AM  [facility] [address] [place], [name], [address] Phone: [phone]   Fax:  [phone]   Name: Logan Price MRN: [id number] Date of Birth: 1945/05/17

## 2019-07-24 ENCOUNTER — Telehealth: Payer: Medicare Other

## 2019-07-24 ENCOUNTER — Other Ambulatory Visit: Payer: Self-pay | Admitting: Radiation Oncology

## 2019-07-24 DIAGNOSIS — C320 Malignant neoplasm of glottis: Secondary | ICD-10-CM

## 2019-07-25 ENCOUNTER — Ambulatory Visit: Payer: Medicare Other | Admitting: Radiation Oncology

## 2019-07-25 ENCOUNTER — Ambulatory Visit: Payer: Medicare Other

## 2019-07-27 ENCOUNTER — Other Ambulatory Visit: Payer: Self-pay

## 2019-07-27 ENCOUNTER — Encounter (HOSPITAL_COMMUNITY): Payer: Self-pay | Admitting: Emergency Medicine

## 2019-07-27 ENCOUNTER — Emergency Department (HOSPITAL_COMMUNITY)
Admission: EM | Admit: 2019-07-27 | Discharge: 2019-07-27 | Disposition: A | Discharge status: Home / Self Care | Payer: Medicare Other | Attending: Emergency Medicine | Admitting: Emergency Medicine

## 2019-07-27 DIAGNOSIS — Z8521 Personal history of malignant neoplasm of larynx: Secondary | ICD-10-CM | POA: Insufficient documentation

## 2019-07-27 DIAGNOSIS — I10 Essential (primary) hypertension: Secondary | ICD-10-CM | POA: Diagnosis not present

## 2019-07-27 DIAGNOSIS — Z79899 Other long term (current) drug therapy: Secondary | ICD-10-CM | POA: Insufficient documentation

## 2019-07-27 DIAGNOSIS — Z85118 Personal history of other malignant neoplasm of bronchus and lung: Secondary | ICD-10-CM | POA: Insufficient documentation

## 2019-07-27 DIAGNOSIS — K9423 Gastrostomy malfunction: Secondary | ICD-10-CM | POA: Insufficient documentation

## 2019-07-27 DIAGNOSIS — Z87891 Personal history of nicotine dependence: Secondary | ICD-10-CM | POA: Diagnosis not present

## 2019-07-27 DIAGNOSIS — Z431 Encounter for attention to gastrostomy: Secondary | ICD-10-CM | POA: Diagnosis present

## 2019-07-27 NOTE — ED Provider Notes (Signed)
[facility] DEPT Provider Note   CSN: [id number] Arrival date & time: 07/27/19  6010     History Chief Complaint  Patient presents with  . feeding tube needs replacing    Logan Price is a 75 y.o. male w/ hx of PEG tube here with PEG tube malfunction.  His wife reports she accidentally cut off the top end of his PEG tube this afternoon.  He normally gets 1 tube feeding per day and some medications through his PEG.  He does increasingly eat and tolerate food by mouth.    According to our medical records, his PEG was last replaced in Dec 2020 with a 29 French balloon tube with IR here.   He has had a PEG tube for " a long time," multiple months at least per his report.  No fevers, chills.  HPI     Past Medical History:  Diagnosis Date  . Diverticulitis   . Dysrhythmia 09/2018   episode of SVT while in hospital  . 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 GERD (gastroesophageal reflux disease)   . 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 HTN (hypertension)   . Hyperlipidemia     Patient Active Problem List   Diagnosis Date Noted  . Malignant neoplasm of right upper lobe of lung ([place]) 05/27/2019  . Primary cancer of left upper lobe of lung ([place]) 05/27/2019  . Pseudomonas aeruginosa infection 05/21/2019  . Tracheobronchitis 05/21/2019  . Hx of radiation therapy 05/21/2019  . 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 Adenocarcinoma, lung, left ([place]) 05/21/2019  . Lung nodule 04/23/2019  . Ground glass opacity present on imaging of lung 04/23/2019  . Head and neck cancer ([place]) 04/23/2019  . Tracheostomy tube present ([place]) 04/23/2019  . PEG (percutaneous endoscopic gastrostomy) adjustment/replacement/removal ([place]) 03/08/2019  . 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 Pleural effusion   . 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[place])   . Bacteremia   . Leukocytosis   . Normocytic anemia   .  Protein-calorie malnutrition, severe 10/08/2018  . PSVT (paroxysmal supraventricular tachycardia) ([place]) 10/03/2018  . Atrial fibrillation ([place]) 10/03/2018  . Goals of care, counseling/discussion   . Dysphagia   . Loss of weight   . 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 Gastritis determined by endoscopy   . Esophageal dysphagia   . 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 Globus sensation 12/12/2016  . Smokes with greater than 40 pack year history 10/20/2016  . Hemorrhoids, internal, with bleeding 10/06/2015  . History of adenomatous polyp of colon 04/14/2015  . Vitamin D deficiency 10/09/2014  . HLD (hyperlipidemia) 10/08/2012  . GERD (gastroesophageal reflux disease) 10/08/2012  . HTN (hypertension)   . Diverticulitis 10/25/2009    Past Surgical History:  Procedure Laterality Date  . BIOPSY  05/04/2015   Procedure: BIOPSY;  Surgeon: Danie Binder, MD;  [facility];  Service: Endoscopy;;  bx's of ileocecal valve   . BRONCHIAL BRUSHINGS  04/29/2019   Procedure: BRONCHIAL BRUSHINGS;  Surgeon: Garner Nash, DO;  Location: [facility];  Service: Pulmonary;;  . BRONCHIAL NEEDLE ASPIRATION BIOPSY  04/29/2019   Procedure: BRONCHIAL NEEDLE ASPIRATION BIOPSIES;  Surgeon: Garner Nash, DO;  Location: [facility];  Service: Pulmonary;;  . BRONCHIAL WASHINGS  04/29/2019   Procedure: BRONCHIAL WASHINGS;  Surgeon: Garner Nash, DO;  Location: [facility];  Service: Pulmonary;;  .  COLONOSCOPY WITH PROPOFOL N/A 05/04/2015   Dr. Oneida Alar: normal appearing ileum with prominent IC valve with tubular adenomas, moderate diverticulosis in sigmoid colon, ascending colon, and retum. Moderate sized internal hemorrhoids. Surveillance in 5 years  . ELECTROMAGNETIC NAVIGATION BROCHOSCOPY  04/29/2019   Procedure: NAVIGATION BRONCHOSCOPY;  Surgeon: Garner Nash, DO;  Location: [facility];  Service: Pulmonary;;  . ESOPHAGOGASTRODUODENOSCOPY (EGD) WITH  PROPOFOL N/A 12/19/2016   Procedure: ESOPHAGOGASTRODUODENOSCOPY (EGD) WITH PROPOFOL;  Surgeon: Danie Binder, MD;  [facility];  Service: Endoscopy;  Laterality: N/A;  11:30am  . FLEXIBLE SIGMOIDOSCOPY N/A 12/10/2015   hemorrhoid banding X 3   . HEMORRHOID BANDING N/A 12/10/2015   Procedure: HEMORRHOID BANDING;  Surgeon: Danie Binder, MD;  [facility];  Service: Endoscopy;  Laterality: N/A;  1:30 PM  . INSERTION OF SUPRAPUBIC CATHETER N/A 02/24/2019   Procedure: INSERTION OF SUPRAPUBIC CATHETER;  Surgeon: Kathie Rhodes, MD;  Location: [facility];  Service: Urology;  Laterality: N/A;  . IR CM INJ ANY COLONIC TUBE W/FLUORO  10/30/2018  . IR GASTROSTOMY TUBE MOD SED  10/04/2018  . IR IMAGING GUIDED PORT INSERTION  10/04/2018  . IR REMOVAL TUN ACCESS W/ PORT W/O FL MOD SED  10/14/2018  . IR REPLACE G-TUBE SIMPLE WO FLUORO  11/03/2018  . LAPAROSCOPIC INSERTION GASTROSTOMY TUBE Left 10/07/2018   Procedure: LAPAROSCOPIC  GASTROSTOMY TUBE;  Surgeon: Kinsinger, Arta Bruce, MD;  Location: [facility];  Service: General;  Laterality: Left;  Marland Kitchen MICROLARYNGOSCOPY N/A 09/27/2018   Procedure: MICRO DIRECT LARYNGOSCOPY WITH BIOPSY;  Surgeon: Leta Baptist, MD;  [facility];  Service: ENT;  Laterality: N/A;  . None to date     As of 04/14/15  . POLYPECTOMY  05/04/2015   Procedure: POLYPECTOMY;  Surgeon: Danie Binder, MD;  [facility];  Service: Endoscopy;;  descending colon polyp, ascending colon polyp  . SAVORY DILATION N/A 12/19/2016   Procedure: SAVORY DILATION;  Surgeon: Danie Binder, MD;  [facility];  Service: Endoscopy;  Laterality: N/A;  . TRACHEOSTOMY TUBE PLACEMENT N/A 09/27/2018   Procedure: AWAKE TRACHEOSTOMY;  Surgeon: Leta Baptist, MD;  Location: [facility];  Service: ENT;  Laterality: N/A;  . TRANSURETHRAL RESECTION OF PROSTATE N/A 02/24/2019   Procedure: TRANSURETHRAL RESECTION OF THE PROSTATE (TURP);  Surgeon: Kathie Rhodes, MD;  [facility];  Service: Urology;   Laterality: N/A;  . VIDEO BRONCHOSCOPY WITH ENDOBRONCHIAL NAVIGATION N/A 04/29/2019   Procedure: VIDEO BRONCHOSCOPY;  Surgeon: Garner Nash, DO;  Location: [facility];  Service: Pulmonary;  Laterality: N/A;       Family History  Problem Relation Age of Onset  . Cancer Mother   . Hypertension Father   . Hypertension Sister   . Hypertension Brother   . Hypertension Sister   . Aneurysm Brother 63       brain  . Colon cancer Neg Hx   . Colon polyps Neg Hx     Social History   Tobacco Use  . Smoking status: Former Smoker    Packs/day: 1.00    Years: 40.00    Pack years: 40.00    Types: Cigarettes    Quit date: 10/09/2006    Years since quitting: 12.8  . Smokeless tobacco: Never Used  Substance Use Topics  . Alcohol use: Not Currently    Alcohol/week: 0.0 standard drinks    Comment: he denies 01/06/19  . Drug use: No    Home Medications Prior to Admission medications  Medication Sig Start Date End Date Taking? Authorizing Provider  albuterol (PROVENTIL) (2.5 MG/3ML) 0.083% nebulizer solution every 6 hours as directed by provider 05/21/19   Icard, Octavio Graves, DO  Amino Acids-Protein Hydrolys (FEEDING SUPPLEMENT, PRO-STAT SUGAR FREE 64,) LIQD Place 30 mLs into feeding tube 2 (two) times daily. 10/09/18   Leta Baptist, MD  doxazosin (CARDURA) 1 MG tablet Place 1 tablet (1 mg total) into feeding tube daily. Patient taking differently: Place 1 mg into feeding tube daily at 12 noon.  10/10/18   Leta Baptist, MD  Nutritional Supplements (KATE FARMS PEPTIDE 1.5) LIQD 325 mLs by PEG Tube route continuous. Patient taking differently: 325 mLs by PEG Tube route 4 (four) times daily.  11/07/18   Rai, Ripudeep K, MD  pantoprazole (PROTONIX) 40 MG tablet 1 po 30 mins prior to first meal Patient taking differently: Take 40 mg by mouth daily.  12/12/17   Fields, Marga Melnick, MD  simvastatin (ZOCOR) 20 MG tablet Take 1 tablet (20 mg total) by mouth daily at 6 PM. 04/14/19   Sharion Balloon, FNP  Water  For Irrigation, Sterile (FREE WATER) SOLN Place 200 mLs into feeding tube every 4 (four) hours. 11/07/18   Mendel Corning, MD    Allergies    Patient has no known allergies.  Review of Systems   Review of Systems  Constitutional: Negative for chills and fever.  Respiratory: Negative for cough and shortness of breath.   Cardiovascular: Negative for chest pain and palpitations.  Gastrointestinal: Negative for abdominal pain and vomiting.  Musculoskeletal: Negative for arthralgias and back pain.  Skin: Negative for color change and rash.  Psychiatric/Behavioral: Negative for agitation and confusion.  All other systems reviewed and are negative.   Physical Exam Updated Vital Signs BP (!) 144/77 (BP Location: Left Arm)   Pulse 75   Temp 97.9 F (36.6 C) (Oral)   Resp 16   Ht 5\' 9"  (1.753 m)   Wt 68.9 kg   SpO2 100%   BMI 22.45 kg/m   Physical Exam Vitals and nursing note reviewed.  Constitutional:      Appearance: He is well-developed.  HENT:     Head: Normocephalic and atraumatic.  Eyes:     Conjunctiva/sclera: Conjunctivae normal.  Cardiovascular:     Rate and Rhythm: Normal rate and regular rhythm.     Heart sounds: No murmur.  Pulmonary:     Effort: Pulmonary effort is normal. No respiratory distress.     Breath sounds: Normal breath sounds.  Abdominal:     Palpations: Abdomen is soft.     Tenderness: There is no abdominal tenderness.     Comments: PEG tube inserted through abdominal wall, with Port end cut off, draining chyme  Musculoskeletal:     Cervical back: Neck supple.  Skin:    General: Skin is warm and dry.  Neurological:     Mental Status: He is alert.  Psychiatric:        Mood and Affect: Mood normal.        Behavior: Behavior normal.     ED Results / Procedures / Treatments   Labs (all labs ordered are listed, but only abnormal results are displayed) Labs Reviewed - No data to display  EKG None  Radiology No results  found.  Procedures Procedures (including critical care time)  Medications Ordered in ED Medications - No data to display  ED Course  I have reviewed the triage vital signs and the nursing notes.  Pertinent labs & imaging results that were available during my care of the patient were reviewed by me and considered in my medical decision making (see chart for details).  74 yo male w/ chronic peg tube here with accidentally cutting off port end of his PEG tube at home. No evidence of infection on arrival He does tolerate most things by mouth now, only uses PEG once daily for a feed and some medications  We attempted replacement with a 16 French PEG initially as this was the smallest size we could find in the hospital, but this was too large.   I think spoke to Dr Kathlene Cote from IR who recommended using a 16F foley as a temporary replacement until the patient could be seen in their office tomorrow.  I was able to successfully and easily pass the foley into his stomach, inflating the water balloon, and gently retracted to ensure it was in location.  I easily flushed the foley tube.  There was no discomfort in placing it.  I also used the bumper from his PEG tube with the foley catheter to ensure it was snug against the skin, to reduce leakage.  Clinical Course as of Jul 27 1742  Sun Jul 27, 2019  1124 Trying to locate a 84 F tube to replace the old one   [MT]  32 Was able to replace his PEG tube with a 59 French Foley catheter after discussion with interventional radiology attending on the phone.  Their team will call him in the morning to come into the office to have it formally replaced.  Unfortunately we do not have a 14 french PEG tube ANYWHERE in our facility, after the valiant efforts of our staff to locate it for multiple hours   [MT]  1248 Functionally the Foley catheter will work the same way as the PEG tube.  Explained this to the patient and his wife.  He only received a single round  of feeds through this, but otherwise has been taking all of his food and meds by mouth.  I think he will be okay for 24 hours   [MT]    Clinical Course User Index [MT] Keyerra Lamere, Carola Rhine, MD   Final Clinical Impression(s) / ED Diagnoses Final diagnoses:  PEG tube malfunction [facility])    Rx / DC Orders ED Discharge Orders    None       Lolita Faulds, Carola Rhine, MD 07/27/19 1744

## 2019-07-27 NOTE — ED Triage Notes (Signed)
Pt's caregiver was changing the dressing and when cutting the dressing accidentally cut the feeding tube.

## 2019-07-27 NOTE — Discharge Instructions (Addendum)
The interventional radiologist's office will call you tomorrow morning to schedule an office follow up to replace your PEG tube.  Please keep an eye on your phone.  You can use the foley catheter the same way as a PEG tube.  Take off the end cap and administer your feeds and medications through the tube.  It may leak a bit around the skin or back through the tubing.  This will hopefully be replaced tomorrow.

## 2019-07-28 ENCOUNTER — Other Ambulatory Visit (HOSPITAL_COMMUNITY): Payer: Self-pay | Admitting: Interventional Radiology

## 2019-07-28 ENCOUNTER — Ambulatory Visit (HOSPITAL_COMMUNITY)
Admission: RE | Admit: 2019-07-28 | Discharge: 2019-07-28 | Disposition: A | Discharge status: Home / Self Care | Payer: Medicare Other | Source: Ambulatory Visit | Attending: Interventional Radiology | Admitting: Interventional Radiology

## 2019-07-28 ENCOUNTER — Other Ambulatory Visit: Payer: Self-pay

## 2019-07-28 DIAGNOSIS — Z431 Encounter for attention to gastrostomy: Secondary | ICD-10-CM | POA: Diagnosis not present

## 2019-07-28 DIAGNOSIS — Z931 Gastrostomy status: Secondary | ICD-10-CM

## 2019-07-28 DIAGNOSIS — K9423 Gastrostomy malfunction: Secondary | ICD-10-CM | POA: Diagnosis not present

## 2019-07-28 HISTORY — PX: IR REPLACE G-TUBE SIMPLE WO FLUORO: IMG2323

## 2019-07-28 MED ORDER — IOHEXOL 300 MG/ML  SOLN
50.0000 mL | Freq: Once | INTRAMUSCULAR | Status: AC | PRN
  Administered 2019-07-28: 10 mL

## 2019-07-30 ENCOUNTER — Encounter: Payer: Self-pay | Admitting: Rehabilitation

## 2019-07-30 ENCOUNTER — Ambulatory Visit: Payer: Medicare Other | Attending: Radiation Oncology | Admitting: Rehabilitation

## 2019-07-30 ENCOUNTER — Other Ambulatory Visit: Payer: Self-pay

## 2019-07-30 DIAGNOSIS — R2689 Other abnormalities of gait and mobility: Secondary | ICD-10-CM | POA: Insufficient documentation

## 2019-07-30 DIAGNOSIS — M6281 Muscle weakness (generalized): Secondary | ICD-10-CM | POA: Insufficient documentation

## 2019-07-30 NOTE — Progress Notes (Signed)
Patient Name: Logan Price MRN: [id number] DOB: May 05, 1945 Referring Physician: Suszanne Conners SUI (Profile Not Attached) Date of Service: 07/09/2019 [facility], [place]                                                        End Of Treatment Note  Diagnoses: C32.0-Malignant neoplasm of glottis C34.11-Malignant neoplasm of upper lobe, right bronchus or lung C34.12-Malignant neoplasm of upper lobe, left bronchus or lung  Intent: Curative  Radiation Treatment Dates: 06/03/2019 through 06/13/2019  Site Technique Total Dose (Gy) Dose per Fx (Gy) Completed Fx Beam Energies  Lung, Right: Lung_Rt_Upper IMRT 60/60 12 5/5 6XFFF  Lung, Left: Lung_Lt_Upper IMRT 60/60 12 5/5 6XFFF   Narrative: The patient tolerated radiation therapy relatively well.    Plan: The patient will follow-up with radiation oncology in 1 month.  -----------------------------------  Lonie Peak, MD

## 2019-07-30 NOTE — Patient Instructions (Signed)
Access Code: [email or web address]: [email or web address]: 05/05/2021Prepared by: Marcene Brawn TevisExercises  Seated Hamstring Stretch - 2 x daily - 7 x weekly - 1 sets - 3 reps - 20-30 seconds hold  Supine Lower Trunk Rotation - 2 x daily - 7 x weekly - 1 sets - 5 reps - 10 second hold  Supine Quadricep Sets - 2 x daily - 7 x weekly - 1 sets - 10 reps - 5-6 second hold

## 2019-07-30 NOTE — Therapy (Signed)
Personal Factors and Comorbidities  Age;Fitness;Comorbidity 2    Comorbidities  radiation history, unknown cancer status    Examination-Activity Limitations  Carry    Examination-Participation Restrictions  Yard Work;Community Activity    Stability/Clinical Decision Making  Stable/Uncomplicated    Clinical Decision Making  Low    Rehab Potential  Good    PT Frequency  2x / week    PT Duration  6 weeks    PT Treatment/Interventions  ADLs/Self Care Home Management;Gait training;Therapeutic exercise;Therapeutic activities;Neuromuscular re-education;Patient/family education;Manual techniques    PT Next Visit Plan  begin bil hamstring stretches and mobility, other LEs stretches as needed, general LE strengthening and work on gait and balance    PT Home Exercise Plan  Access Code: [email or web address]    Consulted and Agree with Plan of Care  Patient       Patient will benefit from skilled therapeutic intervention in order to improve the following deficits and impairments:  Pain, Decreased activity tolerance, Difficulty walking, Decreased balance  Visit Diagnosis: Other abnormalities of gait and mobility  Muscle weakness (generalized)     Problem List Patient Active Problem List   Diagnosis Date Noted  . Malignant neoplasm of right upper lobe of lung ([place]) 05/27/2019  . Primary cancer of left upper lobe of lung ([place]) 05/27/2019  . Pseudomonas aeruginosa infection 05/21/2019  . Tracheobronchitis 05/21/2019  . Hx of radiation therapy 05/21/2019  . 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[employer]) 05/21/2019  . Squamous cell lung cancer, right ([place]) 05/21/2019  . Adenocarcinoma, lung, left ([place]) 05/21/2019  . Lung nodule 04/23/2019  . Ground glass opacity present on imaging of lung 04/23/2019  . Head and neck cancer ([place]) 04/23/2019  .  Tracheostomy tube present ([place]) 04/23/2019  . PEG (percutaneous endoscopic gastrostomy) adjustment/replacement/removal ([place]) 03/08/2019  . 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 Pleural effusion   . 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[place])   . Bacteremia   . Leukocytosis   . Normocytic anemia   . Protein-calorie malnutrition, severe 10/08/2018  . PSVT (paroxysmal supraventricular tachycardia) ([place]) 10/03/2018  . Atrial fibrillation ([place]) 10/03/2018  . Goals of care, counseling/discussion   . Dysphagia   . Loss of weight   . 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 Gastritis determined by endoscopy   . Esophageal dysphagia   . 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 Globus sensation 12/12/2016  . Smokes with greater than 40 pack year history 10/20/2016  . Hemorrhoids, internal, with bleeding 10/06/2015  . History of adenomatous polyp of colon 04/14/2015  . Vitamin D deficiency 10/09/2014  . HLD (hyperlipidemia) 10/08/2012  . GERD (gastroesophageal reflux disease) 10/08/2012  . HTN (hypertension)   . Diverticulitis 10/25/2009    [name], PT 07/30/2019, 5:17 PM  [place], [name], [address] Phone: [phone]   Fax:  [phone]  Name: Logan Price MRN: [id number] Date of Birth: 05-09-1945  [place], [name], [address] Phone: [phone]   Fax:  [phone]  Physical Therapy Evaluation  Patient Details  Name: Logan Price MRN: [id number] Date of Birth: [birth year] Referring Provider (PT): Dr. Isidore Moos   Encounter Date: 07/30/2019  PT End of Session - 07/30/19 1708    Visit Number  1    Number of Visits  13    Date for PT Re-Evaluation  09/10/19    PT Start Time  1604    PT Stop Time  1654    PT Time Calculation (min)  50 min    Activity Tolerance  Patient tolerated treatment well    Behavior During Therapy  [facility] for tasks assessed/performed       Past Medical History:  Diagnosis Date  . Diverticulitis   . Dysrhythmia 09/2018   episode of SVT while in hospital  . 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 GERD (gastroesophageal reflux disease)   . 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 HTN (hypertension)   . Hyperlipidemia     Past Surgical History:  Procedure Laterality Date  . BIOPSY  05/04/2015   Procedure: BIOPSY;  Surgeon: Danie Binder, MD;  [facility];  Service: Endoscopy;;  bx's of ileocecal valve   . BRONCHIAL BRUSHINGS  04/29/2019   Procedure: BRONCHIAL BRUSHINGS;  Surgeon: Garner Nash, DO;  Location: [facility];  Service: Pulmonary;;  . BRONCHIAL NEEDLE ASPIRATION BIOPSY  04/29/2019   Procedure: BRONCHIAL NEEDLE ASPIRATION BIOPSIES;  Surgeon: Garner Nash, DO;  Location: [facility];  Service: Pulmonary;;  . BRONCHIAL WASHINGS  04/29/2019   Procedure: BRONCHIAL WASHINGS;  Surgeon: Garner Nash, DO;  [facility];  Service: Pulmonary;;  . COLONOSCOPY WITH PROPOFOL N/A 05/04/2015   Dr. Oneida Alar: normal appearing ileum with prominent IC valve with tubular adenomas, moderate diverticulosis in sigmoid colon, ascending colon, and retum. Moderate sized internal hemorrhoids. Surveillance in 5 years  . ELECTROMAGNETIC NAVIGATION BROCHOSCOPY   04/29/2019   Procedure: NAVIGATION BRONCHOSCOPY;  Surgeon: Garner Nash, DO;  Location: [facility];  Service: Pulmonary;;  . ESOPHAGOGASTRODUODENOSCOPY (EGD) WITH PROPOFOL N/A 12/19/2016   Procedure: ESOPHAGOGASTRODUODENOSCOPY (EGD) WITH PROPOFOL;  Surgeon: Danie Binder, MD;  [facility];  Service: Endoscopy;  Laterality: N/A;  11:30am  . FLEXIBLE SIGMOIDOSCOPY N/A 12/10/2015   hemorrhoid banding X 3   . HEMORRHOID BANDING N/A 12/10/2015   Procedure: HEMORRHOID BANDING;  Surgeon: Danie Binder, MD;  [facility];  Service: Endoscopy;  Laterality: N/A;  1:30 PM  . INSERTION OF SUPRAPUBIC CATHETER N/A 02/24/2019   Procedure: INSERTION OF SUPRAPUBIC CATHETER;  Surgeon: Kathie Rhodes, MD;  [facility];  Service: Urology;  Laterality: N/A;  . IR CM INJ ANY COLONIC TUBE W/FLUORO  10/30/2018  . IR GASTROSTOMY TUBE MOD SED  10/04/2018  . IR IMAGING GUIDED PORT INSERTION  10/04/2018  . IR REMOVAL TUN ACCESS W/ PORT W/O FL MOD SED  10/14/2018  . IR REPLACE G-TUBE SIMPLE WO FLUORO  11/03/2018  . IR REPLACE G-TUBE SIMPLE WO FLUORO  07/28/2019  . LAPAROSCOPIC INSERTION GASTROSTOMY TUBE Left 10/07/2018   Procedure: LAPAROSCOPIC  GASTROSTOMY TUBE;  Surgeon: Kinsinger, Arta Bruce, MD;  Location: [facility];  Service: General;  Laterality: Left;  Marland Kitchen MICROLARYNGOSCOPY N/A 09/27/2018   Procedure: MICRO DIRECT LARYNGOSCOPY WITH BIOPSY;  Surgeon: Leta Baptist, MD;  Location: [facility];  Service: ENT;  Laterality: N/A;  .  [place], [name], [address] Phone: [phone]   Fax:  [phone]  Physical Therapy Evaluation  Patient Details  Name: Logan Price MRN: [id number] Date of Birth: [birth year] Referring Provider (PT): Dr. Isidore Moos   Encounter Date: 07/30/2019  PT End of Session - 07/30/19 1708    Visit Number  1    Number of Visits  13    Date for PT Re-Evaluation  09/10/19    PT Start Time  1604    PT Stop Time  1654    PT Time Calculation (min)  50 min    Activity Tolerance  Patient tolerated treatment well    Behavior During Therapy  [facility] for tasks assessed/performed       Past Medical History:  Diagnosis Date  . Diverticulitis   . Dysrhythmia 09/2018   episode of SVT while in hospital  . 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 GERD (gastroesophageal reflux disease)   . 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 HTN (hypertension)   . Hyperlipidemia     Past Surgical History:  Procedure Laterality Date  . BIOPSY  05/04/2015   Procedure: BIOPSY;  Surgeon: Danie Binder, MD;  [facility];  Service: Endoscopy;;  bx's of ileocecal valve   . BRONCHIAL BRUSHINGS  04/29/2019   Procedure: BRONCHIAL BRUSHINGS;  Surgeon: Garner Nash, DO;  Location: [facility];  Service: Pulmonary;;  . BRONCHIAL NEEDLE ASPIRATION BIOPSY  04/29/2019   Procedure: BRONCHIAL NEEDLE ASPIRATION BIOPSIES;  Surgeon: Garner Nash, DO;  Location: [facility];  Service: Pulmonary;;  . BRONCHIAL WASHINGS  04/29/2019   Procedure: BRONCHIAL WASHINGS;  Surgeon: Garner Nash, DO;  [facility];  Service: Pulmonary;;  . COLONOSCOPY WITH PROPOFOL N/A 05/04/2015   Dr. Oneida Alar: normal appearing ileum with prominent IC valve with tubular adenomas, moderate diverticulosis in sigmoid colon, ascending colon, and retum. Moderate sized internal hemorrhoids. Surveillance in 5 years  . ELECTROMAGNETIC NAVIGATION BROCHOSCOPY   04/29/2019   Procedure: NAVIGATION BRONCHOSCOPY;  Surgeon: Garner Nash, DO;  Location: [facility];  Service: Pulmonary;;  . ESOPHAGOGASTRODUODENOSCOPY (EGD) WITH PROPOFOL N/A 12/19/2016   Procedure: ESOPHAGOGASTRODUODENOSCOPY (EGD) WITH PROPOFOL;  Surgeon: Danie Binder, MD;  [facility];  Service: Endoscopy;  Laterality: N/A;  11:30am  . FLEXIBLE SIGMOIDOSCOPY N/A 12/10/2015   hemorrhoid banding X 3   . HEMORRHOID BANDING N/A 12/10/2015   Procedure: HEMORRHOID BANDING;  Surgeon: Danie Binder, MD;  [facility];  Service: Endoscopy;  Laterality: N/A;  1:30 PM  . INSERTION OF SUPRAPUBIC CATHETER N/A 02/24/2019   Procedure: INSERTION OF SUPRAPUBIC CATHETER;  Surgeon: Kathie Rhodes, MD;  [facility];  Service: Urology;  Laterality: N/A;  . IR CM INJ ANY COLONIC TUBE W/FLUORO  10/30/2018  . IR GASTROSTOMY TUBE MOD SED  10/04/2018  . IR IMAGING GUIDED PORT INSERTION  10/04/2018  . IR REMOVAL TUN ACCESS W/ PORT W/O FL MOD SED  10/14/2018  . IR REPLACE G-TUBE SIMPLE WO FLUORO  11/03/2018  . IR REPLACE G-TUBE SIMPLE WO FLUORO  07/28/2019  . LAPAROSCOPIC INSERTION GASTROSTOMY TUBE Left 10/07/2018   Procedure: LAPAROSCOPIC  GASTROSTOMY TUBE;  Surgeon: Kinsinger, Arta Bruce, MD;  Location: [facility];  Service: General;  Laterality: Left;  Marland Kitchen MICROLARYNGOSCOPY N/A 09/27/2018   Procedure: MICRO DIRECT LARYNGOSCOPY WITH BIOPSY;  Surgeon: Leta Baptist, MD;  Location: [facility];  Service: ENT;  Laterality: N/A;  .  [place], [name], [address] Phone: [phone]   Fax:  [phone]  Physical Therapy Evaluation  Patient Details  Name: Logan Price MRN: [id number] Date of Birth: [birth year] Referring Provider (PT): Dr. Isidore Moos   Encounter Date: 07/30/2019  PT End of Session - 07/30/19 1708    Visit Number  1    Number of Visits  13    Date for PT Re-Evaluation  09/10/19    PT Start Time  1604    PT Stop Time  1654    PT Time Calculation (min)  50 min    Activity Tolerance  Patient tolerated treatment well    Behavior During Therapy  [facility] for tasks assessed/performed       Past Medical History:  Diagnosis Date  . Diverticulitis   . Dysrhythmia 09/2018   episode of SVT while in hospital  . 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 GERD (gastroesophageal reflux disease)   . 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 HTN (hypertension)   . Hyperlipidemia     Past Surgical History:  Procedure Laterality Date  . BIOPSY  05/04/2015   Procedure: BIOPSY;  Surgeon: Danie Binder, MD;  [facility];  Service: Endoscopy;;  bx's of ileocecal valve   . BRONCHIAL BRUSHINGS  04/29/2019   Procedure: BRONCHIAL BRUSHINGS;  Surgeon: Garner Nash, DO;  Location: [facility];  Service: Pulmonary;;  . BRONCHIAL NEEDLE ASPIRATION BIOPSY  04/29/2019   Procedure: BRONCHIAL NEEDLE ASPIRATION BIOPSIES;  Surgeon: Garner Nash, DO;  Location: [facility];  Service: Pulmonary;;  . BRONCHIAL WASHINGS  04/29/2019   Procedure: BRONCHIAL WASHINGS;  Surgeon: Garner Nash, DO;  [facility];  Service: Pulmonary;;  . COLONOSCOPY WITH PROPOFOL N/A 05/04/2015   Dr. Oneida Alar: normal appearing ileum with prominent IC valve with tubular adenomas, moderate diverticulosis in sigmoid colon, ascending colon, and retum. Moderate sized internal hemorrhoids. Surveillance in 5 years  . ELECTROMAGNETIC NAVIGATION BROCHOSCOPY   04/29/2019   Procedure: NAVIGATION BRONCHOSCOPY;  Surgeon: Garner Nash, DO;  Location: [facility];  Service: Pulmonary;;  . ESOPHAGOGASTRODUODENOSCOPY (EGD) WITH PROPOFOL N/A 12/19/2016   Procedure: ESOPHAGOGASTRODUODENOSCOPY (EGD) WITH PROPOFOL;  Surgeon: Danie Binder, MD;  [facility];  Service: Endoscopy;  Laterality: N/A;  11:30am  . FLEXIBLE SIGMOIDOSCOPY N/A 12/10/2015   hemorrhoid banding X 3   . HEMORRHOID BANDING N/A 12/10/2015   Procedure: HEMORRHOID BANDING;  Surgeon: Danie Binder, MD;  [facility];  Service: Endoscopy;  Laterality: N/A;  1:30 PM  . INSERTION OF SUPRAPUBIC CATHETER N/A 02/24/2019   Procedure: INSERTION OF SUPRAPUBIC CATHETER;  Surgeon: Kathie Rhodes, MD;  [facility];  Service: Urology;  Laterality: N/A;  . IR CM INJ ANY COLONIC TUBE W/FLUORO  10/30/2018  . IR GASTROSTOMY TUBE MOD SED  10/04/2018  . IR IMAGING GUIDED PORT INSERTION  10/04/2018  . IR REMOVAL TUN ACCESS W/ PORT W/O FL MOD SED  10/14/2018  . IR REPLACE G-TUBE SIMPLE WO FLUORO  11/03/2018  . IR REPLACE G-TUBE SIMPLE WO FLUORO  07/28/2019  . LAPAROSCOPIC INSERTION GASTROSTOMY TUBE Left 10/07/2018   Procedure: LAPAROSCOPIC  GASTROSTOMY TUBE;  Surgeon: Kinsinger, Arta Bruce, MD;  Location: [facility];  Service: General;  Laterality: Left;  Marland Kitchen MICROLARYNGOSCOPY N/A 09/27/2018   Procedure: MICRO DIRECT LARYNGOSCOPY WITH BIOPSY;  Surgeon: Leta Baptist, MD;  Location: [facility];  Service: ENT;  Laterality: N/A;  .  [place], [name], [address] Phone: [phone]   Fax:  [phone]  Physical Therapy Evaluation  Patient Details  Name: Logan Price MRN: [id number] Date of Birth: [birth year] Referring Provider (PT): Dr. Isidore Moos   Encounter Date: 07/30/2019  PT End of Session - 07/30/19 1708    Visit Number  1    Number of Visits  13    Date for PT Re-Evaluation  09/10/19    PT Start Time  1604    PT Stop Time  1654    PT Time Calculation (min)  50 min    Activity Tolerance  Patient tolerated treatment well    Behavior During Therapy  [facility] for tasks assessed/performed       Past Medical History:  Diagnosis Date  . Diverticulitis   . Dysrhythmia 09/2018   episode of SVT while in hospital  . 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 GERD (gastroesophageal reflux disease)   . 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 HTN (hypertension)   . Hyperlipidemia     Past Surgical History:  Procedure Laterality Date  . BIOPSY  05/04/2015   Procedure: BIOPSY;  Surgeon: Danie Binder, MD;  [facility];  Service: Endoscopy;;  bx's of ileocecal valve   . BRONCHIAL BRUSHINGS  04/29/2019   Procedure: BRONCHIAL BRUSHINGS;  Surgeon: Garner Nash, DO;  Location: [facility];  Service: Pulmonary;;  . BRONCHIAL NEEDLE ASPIRATION BIOPSY  04/29/2019   Procedure: BRONCHIAL NEEDLE ASPIRATION BIOPSIES;  Surgeon: Garner Nash, DO;  Location: [facility];  Service: Pulmonary;;  . BRONCHIAL WASHINGS  04/29/2019   Procedure: BRONCHIAL WASHINGS;  Surgeon: Garner Nash, DO;  [facility];  Service: Pulmonary;;  . COLONOSCOPY WITH PROPOFOL N/A 05/04/2015   Dr. Oneida Alar: normal appearing ileum with prominent IC valve with tubular adenomas, moderate diverticulosis in sigmoid colon, ascending colon, and retum. Moderate sized internal hemorrhoids. Surveillance in 5 years  . ELECTROMAGNETIC NAVIGATION BROCHOSCOPY   04/29/2019   Procedure: NAVIGATION BRONCHOSCOPY;  Surgeon: Garner Nash, DO;  Location: [facility];  Service: Pulmonary;;  . ESOPHAGOGASTRODUODENOSCOPY (EGD) WITH PROPOFOL N/A 12/19/2016   Procedure: ESOPHAGOGASTRODUODENOSCOPY (EGD) WITH PROPOFOL;  Surgeon: Danie Binder, MD;  [facility];  Service: Endoscopy;  Laterality: N/A;  11:30am  . FLEXIBLE SIGMOIDOSCOPY N/A 12/10/2015   hemorrhoid banding X 3   . HEMORRHOID BANDING N/A 12/10/2015   Procedure: HEMORRHOID BANDING;  Surgeon: Danie Binder, MD;  [facility];  Service: Endoscopy;  Laterality: N/A;  1:30 PM  . INSERTION OF SUPRAPUBIC CATHETER N/A 02/24/2019   Procedure: INSERTION OF SUPRAPUBIC CATHETER;  Surgeon: Kathie Rhodes, MD;  [facility];  Service: Urology;  Laterality: N/A;  . IR CM INJ ANY COLONIC TUBE W/FLUORO  10/30/2018  . IR GASTROSTOMY TUBE MOD SED  10/04/2018  . IR IMAGING GUIDED PORT INSERTION  10/04/2018  . IR REMOVAL TUN ACCESS W/ PORT W/O FL MOD SED  10/14/2018  . IR REPLACE G-TUBE SIMPLE WO FLUORO  11/03/2018  . IR REPLACE G-TUBE SIMPLE WO FLUORO  07/28/2019  . LAPAROSCOPIC INSERTION GASTROSTOMY TUBE Left 10/07/2018   Procedure: LAPAROSCOPIC  GASTROSTOMY TUBE;  Surgeon: Kinsinger, Arta Bruce, MD;  Location: [facility];  Service: General;  Laterality: Left;  Marland Kitchen MICROLARYNGOSCOPY N/A 09/27/2018   Procedure: MICRO DIRECT LARYNGOSCOPY WITH BIOPSY;  Surgeon: Leta Baptist, MD;  Location: [facility];  Service: ENT;  Laterality: N/A;  .

## 2019-08-05 ENCOUNTER — Ambulatory Visit (HOSPITAL_COMMUNITY): Payer: Medicare Other | Attending: Family | Admitting: Physical Therapy

## 2019-08-05 ENCOUNTER — Other Ambulatory Visit: Payer: Self-pay

## 2019-08-05 DIAGNOSIS — M6281 Muscle weakness (generalized): Secondary | ICD-10-CM | POA: Insufficient documentation

## 2019-08-05 DIAGNOSIS — R1313 Dysphagia, pharyngeal phase: Secondary | ICD-10-CM | POA: Diagnosis not present

## 2019-08-05 DIAGNOSIS — R2689 Other abnormalities of gait and mobility: Secondary | ICD-10-CM | POA: Diagnosis not present

## 2019-08-05 NOTE — Therapy (Signed)
[facility] [address] [place], [name], [address] Phone: [phone]   Fax:  [phone]  Physical Therapy Treatment  Patient Details  Name: Logan Price MRN: [id number] Date of Birth: [birth year] Referring Provider (PT): Dr. Isidore Moos   Encounter Date: 08/05/2019  PT End of Session - 08/05/19 1416    Visit Number  2    Number of Visits  13    Date for PT Re-Evaluation  09/10/19    Progress Note Due on Visit  10    PT Start Time  1315    PT Stop Time  1400    PT Time Calculation (min)  45 min    Activity Tolerance  Patient tolerated treatment well    Behavior During Therapy  [facility] for tasks assessed/performed       Past Medical History:  Diagnosis Date  . Diverticulitis   . Dysrhythmia 09/2018   episode of SVT while in hospital  . 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 GERD (gastroesophageal reflux disease)   . 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 HTN (hypertension)   . Hyperlipidemia     Past Surgical History:  Procedure Laterality Date  . BIOPSY  05/04/2015   Procedure: BIOPSY;  Surgeon: Danie Binder, MD;  [facility];  Service: Endoscopy;;  bx's of ileocecal valve   . BRONCHIAL BRUSHINGS  04/29/2019   Procedure: BRONCHIAL BRUSHINGS;  Surgeon: Garner Nash, DO;  Location: [facility];  Service: Pulmonary;;  . BRONCHIAL NEEDLE ASPIRATION BIOPSY  04/29/2019   Procedure: BRONCHIAL NEEDLE ASPIRATION BIOPSIES;  Surgeon: Garner Nash, DO;  Location: [facility];  Service: Pulmonary;;  . BRONCHIAL WASHINGS  04/29/2019   Procedure: BRONCHIAL WASHINGS;  Surgeon: Garner Nash, DO;  [facility];  Service: Pulmonary;;  . COLONOSCOPY WITH PROPOFOL N/A 05/04/2015   Dr. Oneida Alar: normal appearing ileum with prominent IC valve with tubular adenomas, moderate diverticulosis in sigmoid colon, ascending colon, and retum. Moderate sized internal hemorrhoids. Surveillance in 5 years  . ELECTROMAGNETIC  NAVIGATION BROCHOSCOPY  04/29/2019   Procedure: NAVIGATION BRONCHOSCOPY;  Surgeon: Garner Nash, DO;  Location: [facility];  Service: Pulmonary;;  . ESOPHAGOGASTRODUODENOSCOPY (EGD) WITH PROPOFOL N/A 12/19/2016   Procedure: ESOPHAGOGASTRODUODENOSCOPY (EGD) WITH PROPOFOL;  Surgeon: Danie Binder, MD;  [facility];  Service: Endoscopy;  Laterality: N/A;  11:30am  . FLEXIBLE SIGMOIDOSCOPY N/A 12/10/2015   hemorrhoid banding X 3   . HEMORRHOID BANDING N/A 12/10/2015   Procedure: HEMORRHOID BANDING;  Surgeon: Danie Binder, MD;  [facility];  Service: Endoscopy;  Laterality: N/A;  1:30 PM  . INSERTION OF SUPRAPUBIC CATHETER N/A 02/24/2019   Procedure: INSERTION OF SUPRAPUBIC CATHETER;  Surgeon: Kathie Rhodes, MD;  [facility];  Service: Urology;  Laterality: N/A;  . IR CM INJ ANY COLONIC TUBE W/FLUORO  10/30/2018  . IR GASTROSTOMY TUBE MOD SED  10/04/2018  . IR IMAGING GUIDED PORT INSERTION  10/04/2018  . IR REMOVAL TUN ACCESS W/ PORT W/O FL MOD SED  10/14/2018  . IR REPLACE G-TUBE SIMPLE WO FLUORO  11/03/2018  . IR REPLACE G-TUBE SIMPLE WO FLUORO  07/28/2019  . LAPAROSCOPIC INSERTION GASTROSTOMY TUBE Left 10/07/2018   Procedure: LAPAROSCOPIC  GASTROSTOMY TUBE;  Surgeon: Kinsinger, Arta Bruce, MD;  Location: [facility];  Service: General;  Laterality: Left;  Marland Kitchen MICROLARYNGOSCOPY N/A 09/27/2018   Procedure: MICRO DIRECT LARYNGOSCOPY WITH BIOPSY;  Surgeon: Leta Baptist, MD;  greater than 40 pack year history 10/20/2016  . Hemorrhoids, internal, with bleeding 10/06/2015  . History of adenomatous polyp of colon 04/14/2015  . Vitamin D deficiency 10/09/2014  . HLD (hyperlipidemia) 10/08/2012  . GERD  (gastroesophageal reflux disease) 10/08/2012  . HTN (hypertension)   . Diverticulitis 10/25/2009   Teena Irani, PTA/CLT [phone]  Teena Irani 08/05/2019, 2:37 PM  [facility] [address] [place], [name], [address] Phone: [phone]   Fax:  [phone]  Name: DEVERY MURGIA MRN: [id number] Date of Birth: 11-20-1945  [facility] [address] [place], [name], [address] Phone: [phone]   Fax:  [phone]  Physical Therapy Treatment  Patient Details  Name: Logan Price MRN: [id number] Date of Birth: [birth year] Referring Provider (PT): Dr. Isidore Moos   Encounter Date: 08/05/2019  PT End of Session - 08/05/19 1416    Visit Number  2    Number of Visits  13    Date for PT Re-Evaluation  09/10/19    Progress Note Due on Visit  10    PT Start Time  1315    PT Stop Time  1400    PT Time Calculation (min)  45 min    Activity Tolerance  Patient tolerated treatment well    Behavior During Therapy  [facility] for tasks assessed/performed       Past Medical History:  Diagnosis Date  . Diverticulitis   . Dysrhythmia 09/2018   episode of SVT while in hospital  . 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 GERD (gastroesophageal reflux disease)   . 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 HTN (hypertension)   . Hyperlipidemia     Past Surgical History:  Procedure Laterality Date  . BIOPSY  05/04/2015   Procedure: BIOPSY;  Surgeon: Danie Binder, MD;  [facility];  Service: Endoscopy;;  bx's of ileocecal valve   . BRONCHIAL BRUSHINGS  04/29/2019   Procedure: BRONCHIAL BRUSHINGS;  Surgeon: Garner Nash, DO;  Location: [facility];  Service: Pulmonary;;  . BRONCHIAL NEEDLE ASPIRATION BIOPSY  04/29/2019   Procedure: BRONCHIAL NEEDLE ASPIRATION BIOPSIES;  Surgeon: Garner Nash, DO;  Location: [facility];  Service: Pulmonary;;  . BRONCHIAL WASHINGS  04/29/2019   Procedure: BRONCHIAL WASHINGS;  Surgeon: Garner Nash, DO;  [facility];  Service: Pulmonary;;  . COLONOSCOPY WITH PROPOFOL N/A 05/04/2015   Dr. Oneida Alar: normal appearing ileum with prominent IC valve with tubular adenomas, moderate diverticulosis in sigmoid colon, ascending colon, and retum. Moderate sized internal hemorrhoids. Surveillance in 5 years  . ELECTROMAGNETIC  NAVIGATION BROCHOSCOPY  04/29/2019   Procedure: NAVIGATION BRONCHOSCOPY;  Surgeon: Garner Nash, DO;  Location: [facility];  Service: Pulmonary;;  . ESOPHAGOGASTRODUODENOSCOPY (EGD) WITH PROPOFOL N/A 12/19/2016   Procedure: ESOPHAGOGASTRODUODENOSCOPY (EGD) WITH PROPOFOL;  Surgeon: Danie Binder, MD;  [facility];  Service: Endoscopy;  Laterality: N/A;  11:30am  . FLEXIBLE SIGMOIDOSCOPY N/A 12/10/2015   hemorrhoid banding X 3   . HEMORRHOID BANDING N/A 12/10/2015   Procedure: HEMORRHOID BANDING;  Surgeon: Danie Binder, MD;  [facility];  Service: Endoscopy;  Laterality: N/A;  1:30 PM  . INSERTION OF SUPRAPUBIC CATHETER N/A 02/24/2019   Procedure: INSERTION OF SUPRAPUBIC CATHETER;  Surgeon: Kathie Rhodes, MD;  [facility];  Service: Urology;  Laterality: N/A;  . IR CM INJ ANY COLONIC TUBE W/FLUORO  10/30/2018  . IR GASTROSTOMY TUBE MOD SED  10/04/2018  . IR IMAGING GUIDED PORT INSERTION  10/04/2018  . IR REMOVAL TUN ACCESS W/ PORT W/O FL MOD SED  10/14/2018  . IR REPLACE G-TUBE SIMPLE WO FLUORO  11/03/2018  . IR REPLACE G-TUBE SIMPLE WO FLUORO  07/28/2019  . LAPAROSCOPIC INSERTION GASTROSTOMY TUBE Left 10/07/2018   Procedure: LAPAROSCOPIC  GASTROSTOMY TUBE;  Surgeon: Kinsinger, Arta Bruce, MD;  Location: [facility];  Service: General;  Laterality: Left;  Marland Kitchen MICROLARYNGOSCOPY N/A 09/27/2018   Procedure: MICRO DIRECT LARYNGOSCOPY WITH BIOPSY;  Surgeon: Leta Baptist, MD;  [facility] [address] [place], [name], [address] Phone: [phone]   Fax:  [phone]  Physical Therapy Treatment  Patient Details  Name: Logan Price MRN: [id number] Date of Birth: [birth year] Referring Provider (PT): Dr. Isidore Moos   Encounter Date: 08/05/2019  PT End of Session - 08/05/19 1416    Visit Number  2    Number of Visits  13    Date for PT Re-Evaluation  09/10/19    Progress Note Due on Visit  10    PT Start Time  1315    PT Stop Time  1400    PT Time Calculation (min)  45 min    Activity Tolerance  Patient tolerated treatment well    Behavior During Therapy  [facility] for tasks assessed/performed       Past Medical History:  Diagnosis Date  . Diverticulitis   . Dysrhythmia 09/2018   episode of SVT while in hospital  . 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 GERD (gastroesophageal reflux disease)   . 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 HTN (hypertension)   . Hyperlipidemia     Past Surgical History:  Procedure Laterality Date  . BIOPSY  05/04/2015   Procedure: BIOPSY;  Surgeon: Danie Binder, MD;  [facility];  Service: Endoscopy;;  bx's of ileocecal valve   . BRONCHIAL BRUSHINGS  04/29/2019   Procedure: BRONCHIAL BRUSHINGS;  Surgeon: Garner Nash, DO;  Location: [facility];  Service: Pulmonary;;  . BRONCHIAL NEEDLE ASPIRATION BIOPSY  04/29/2019   Procedure: BRONCHIAL NEEDLE ASPIRATION BIOPSIES;  Surgeon: Garner Nash, DO;  Location: [facility];  Service: Pulmonary;;  . BRONCHIAL WASHINGS  04/29/2019   Procedure: BRONCHIAL WASHINGS;  Surgeon: Garner Nash, DO;  [facility];  Service: Pulmonary;;  . COLONOSCOPY WITH PROPOFOL N/A 05/04/2015   Dr. Oneida Alar: normal appearing ileum with prominent IC valve with tubular adenomas, moderate diverticulosis in sigmoid colon, ascending colon, and retum. Moderate sized internal hemorrhoids. Surveillance in 5 years  . ELECTROMAGNETIC  NAVIGATION BROCHOSCOPY  04/29/2019   Procedure: NAVIGATION BRONCHOSCOPY;  Surgeon: Garner Nash, DO;  Location: [facility];  Service: Pulmonary;;  . ESOPHAGOGASTRODUODENOSCOPY (EGD) WITH PROPOFOL N/A 12/19/2016   Procedure: ESOPHAGOGASTRODUODENOSCOPY (EGD) WITH PROPOFOL;  Surgeon: Danie Binder, MD;  [facility];  Service: Endoscopy;  Laterality: N/A;  11:30am  . FLEXIBLE SIGMOIDOSCOPY N/A 12/10/2015   hemorrhoid banding X 3   . HEMORRHOID BANDING N/A 12/10/2015   Procedure: HEMORRHOID BANDING;  Surgeon: Danie Binder, MD;  [facility];  Service: Endoscopy;  Laterality: N/A;  1:30 PM  . INSERTION OF SUPRAPUBIC CATHETER N/A 02/24/2019   Procedure: INSERTION OF SUPRAPUBIC CATHETER;  Surgeon: Kathie Rhodes, MD;  [facility];  Service: Urology;  Laterality: N/A;  . IR CM INJ ANY COLONIC TUBE W/FLUORO  10/30/2018  . IR GASTROSTOMY TUBE MOD SED  10/04/2018  . IR IMAGING GUIDED PORT INSERTION  10/04/2018  . IR REMOVAL TUN ACCESS W/ PORT W/O FL MOD SED  10/14/2018  . IR REPLACE G-TUBE SIMPLE WO FLUORO  11/03/2018  . IR REPLACE G-TUBE SIMPLE WO FLUORO  07/28/2019  . LAPAROSCOPIC INSERTION GASTROSTOMY TUBE Left 10/07/2018   Procedure: LAPAROSCOPIC  GASTROSTOMY TUBE;  Surgeon: Kinsinger, Arta Bruce, MD;  Location: [facility];  Service: General;  Laterality: Left;  Marland Kitchen MICROLARYNGOSCOPY N/A 09/27/2018   Procedure: MICRO DIRECT LARYNGOSCOPY WITH BIOPSY;  Surgeon: Leta Baptist, MD;

## 2019-08-07 ENCOUNTER — Ambulatory Visit (HOSPITAL_COMMUNITY): Payer: Medicare Other | Admitting: Physical Therapy

## 2019-08-07 ENCOUNTER — Other Ambulatory Visit: Payer: Self-pay

## 2019-08-07 DIAGNOSIS — R2689 Other abnormalities of gait and mobility: Secondary | ICD-10-CM

## 2019-08-07 DIAGNOSIS — M6281 Muscle weakness (generalized): Secondary | ICD-10-CM

## 2019-08-07 DIAGNOSIS — R1313 Dysphagia, pharyngeal phase: Secondary | ICD-10-CM | POA: Diagnosis not present

## 2019-08-07 NOTE — Therapy (Signed)
[facility] [place], [name], [address] Phone: [phone]   Fax:  [phone]  Physical Therapy Treatment  Patient Details  Name: Logan Price MRN: [id number] Date of Birth: September 15, 1945 Referring Provider (PT): Dr. Isidore Moos   Encounter Date: 08/07/2019  PT End of Session - 08/07/19 1421    Visit Number  3    Number of Visits  13    Date for PT Re-Evaluation  09/10/19    Progress Note Due on Visit  10    PT Start Time  1400    PT Stop Time  1440    PT Time Calculation (min)  40 min    Activity Tolerance  Patient tolerated treatment well    Behavior During Therapy  [facility] for tasks assessed/performed       Past Medical History:  Diagnosis Date  . Diverticulitis   . Dysrhythmia 09/2018   episode of SVT while in hospital  . 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 GERD (gastroesophageal reflux disease)   . 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 HTN (hypertension)   . Hyperlipidemia     Past Surgical History:  Procedure Laterality Date  . BIOPSY  05/04/2015   Procedure: BIOPSY;  Surgeon: Danie Binder, MD;  [facility];  Service: Endoscopy;;  bx's of ileocecal valve   . BRONCHIAL BRUSHINGS  04/29/2019   Procedure: BRONCHIAL BRUSHINGS;  Surgeon: Garner Nash, DO;  Location: [facility];  Service: Pulmonary;;  . BRONCHIAL NEEDLE ASPIRATION BIOPSY  04/29/2019   Procedure: BRONCHIAL NEEDLE ASPIRATION BIOPSIES;  Surgeon: Garner Nash, DO;  Location: [facility];  Service: Pulmonary;;  . BRONCHIAL WASHINGS  04/29/2019   Procedure: BRONCHIAL WASHINGS;  Surgeon: Garner Nash, DO;  [facility];  Service: Pulmonary;;  . COLONOSCOPY WITH PROPOFOL N/A 05/04/2015   Dr. Oneida Alar: normal appearing ileum with prominent IC valve with tubular adenomas, moderate diverticulosis in sigmoid colon, ascending colon, and retum. Moderate sized internal hemorrhoids. Surveillance in 5 years  . ELECTROMAGNETIC  NAVIGATION BROCHOSCOPY  04/29/2019   Procedure: NAVIGATION BRONCHOSCOPY;  Surgeon: Garner Nash, DO;  Location: [facility];  Service: Pulmonary;;  . ESOPHAGOGASTRODUODENOSCOPY (EGD) WITH PROPOFOL N/A 12/19/2016   Procedure: ESOPHAGOGASTRODUODENOSCOPY (EGD) WITH PROPOFOL;  Surgeon: Danie Binder, MD;  [facility];  Service: Endoscopy;  Laterality: N/A;  11:30am  . FLEXIBLE SIGMOIDOSCOPY N/A 12/10/2015   hemorrhoid banding X 3   . HEMORRHOID BANDING N/A 12/10/2015   Procedure: HEMORRHOID BANDING;  Surgeon: Danie Binder, MD;  [facility];  Service: Endoscopy;  Laterality: N/A;  1:30 PM  . INSERTION OF SUPRAPUBIC CATHETER N/A 02/24/2019   Procedure: INSERTION OF SUPRAPUBIC CATHETER;  Surgeon: Kathie Rhodes, MD;  [facility];  Service: Urology;  Laterality: N/A;  . IR CM INJ ANY COLONIC TUBE W/FLUORO  10/30/2018  . IR GASTROSTOMY TUBE MOD SED  10/04/2018  . IR IMAGING GUIDED PORT INSERTION  10/04/2018  . IR REMOVAL TUN ACCESS W/ PORT W/O FL MOD SED  10/14/2018  . IR REPLACE G-TUBE SIMPLE WO FLUORO  11/03/2018  . IR REPLACE G-TUBE SIMPLE WO FLUORO  07/28/2019  . LAPAROSCOPIC INSERTION GASTROSTOMY TUBE Left 10/07/2018   Procedure: LAPAROSCOPIC  GASTROSTOMY TUBE;  Surgeon: Kinsinger, Arta Bruce, MD;  Location: [facility];  Service: General;  Laterality: Left;  Marland Kitchen MICROLARYNGOSCOPY N/A 09/27/2018   Procedure: MICRO DIRECT LARYNGOSCOPY WITH BIOPSY;  Surgeon: Leta Baptist, MD;  Hx of radiation therapy 05/21/2019  . 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 Adenocarcinoma, lung, left ([place]) 05/21/2019  . Lung nodule 04/23/2019  . Ground glass opacity present on imaging of lung 04/23/2019  . Head and neck cancer ([place]) 04/23/2019  . Tracheostomy tube present ([place]) 04/23/2019  . PEG (percutaneous endoscopic gastrostomy) adjustment/replacement/removal ([place]) 03/08/2019  . 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 Pleural effusion   . Laryngeal  cancer ([place])   . Bacteremia   . Leukocytosis   . Normocytic anemia   . Protein-calorie malnutrition, severe 10/08/2018  . PSVT (paroxysmal supraventricular tachycardia) ([place]) 10/03/2018  . Atrial fibrillation ([place]) 10/03/2018  . Goals of care, counseling/discussion   . Dysphagia   . Loss of weight   . 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 Gastritis determined by endoscopy   . Esophageal dysphagia   . 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 Globus sensation 12/12/2016  . Smokes with greater than 40 pack year history 10/20/2016  . Hemorrhoids, internal, with bleeding 10/06/2015  . History of adenomatous polyp of colon 04/14/2015  . Vitamin D deficiency 10/09/2014  . HLD (hyperlipidemia) 10/08/2012  . GERD (gastroesophageal reflux disease) 10/08/2012  . HTN (hypertension)   . Diverticulitis 10/25/2009    Rayetta Humphrey, PT CLT [phone] 08/07/2019, 2:45 PM  [facility] [address] [place], [name], [address] Phone: [phone]   Fax:  [phone]  Name: IVANN TRIMARCO MRN: [id number] Date of Birth: Nov 03, 1945  [facility] [place], [name], [address] Phone: [phone]   Fax:  [phone]  Physical Therapy Treatment  Patient Details  Name: Logan Price MRN: [id number] Date of Birth: September 15, 1945 Referring Provider (PT): Dr. Isidore Moos   Encounter Date: 08/07/2019  PT End of Session - 08/07/19 1421    Visit Number  3    Number of Visits  13    Date for PT Re-Evaluation  09/10/19    Progress Note Due on Visit  10    PT Start Time  1400    PT Stop Time  1440    PT Time Calculation (min)  40 min    Activity Tolerance  Patient tolerated treatment well    Behavior During Therapy  [facility] for tasks assessed/performed       Past Medical History:  Diagnosis Date  . Diverticulitis   . Dysrhythmia 09/2018   episode of SVT while in hospital  . 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 GERD (gastroesophageal reflux disease)   . 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 HTN (hypertension)   . Hyperlipidemia     Past Surgical History:  Procedure Laterality Date  . BIOPSY  05/04/2015   Procedure: BIOPSY;  Surgeon: Danie Binder, MD;  [facility];  Service: Endoscopy;;  bx's of ileocecal valve   . BRONCHIAL BRUSHINGS  04/29/2019   Procedure: BRONCHIAL BRUSHINGS;  Surgeon: Garner Nash, DO;  Location: [facility];  Service: Pulmonary;;  . BRONCHIAL NEEDLE ASPIRATION BIOPSY  04/29/2019   Procedure: BRONCHIAL NEEDLE ASPIRATION BIOPSIES;  Surgeon: Garner Nash, DO;  Location: [facility];  Service: Pulmonary;;  . BRONCHIAL WASHINGS  04/29/2019   Procedure: BRONCHIAL WASHINGS;  Surgeon: Garner Nash, DO;  [facility];  Service: Pulmonary;;  . COLONOSCOPY WITH PROPOFOL N/A 05/04/2015   Dr. Oneida Alar: normal appearing ileum with prominent IC valve with tubular adenomas, moderate diverticulosis in sigmoid colon, ascending colon, and retum. Moderate sized internal hemorrhoids. Surveillance in 5 years  . ELECTROMAGNETIC  NAVIGATION BROCHOSCOPY  04/29/2019   Procedure: NAVIGATION BRONCHOSCOPY;  Surgeon: Garner Nash, DO;  Location: [facility];  Service: Pulmonary;;  . ESOPHAGOGASTRODUODENOSCOPY (EGD) WITH PROPOFOL N/A 12/19/2016   Procedure: ESOPHAGOGASTRODUODENOSCOPY (EGD) WITH PROPOFOL;  Surgeon: Danie Binder, MD;  [facility];  Service: Endoscopy;  Laterality: N/A;  11:30am  . FLEXIBLE SIGMOIDOSCOPY N/A 12/10/2015   hemorrhoid banding X 3   . HEMORRHOID BANDING N/A 12/10/2015   Procedure: HEMORRHOID BANDING;  Surgeon: Danie Binder, MD;  [facility];  Service: Endoscopy;  Laterality: N/A;  1:30 PM  . INSERTION OF SUPRAPUBIC CATHETER N/A 02/24/2019   Procedure: INSERTION OF SUPRAPUBIC CATHETER;  Surgeon: Kathie Rhodes, MD;  [facility];  Service: Urology;  Laterality: N/A;  . IR CM INJ ANY COLONIC TUBE W/FLUORO  10/30/2018  . IR GASTROSTOMY TUBE MOD SED  10/04/2018  . IR IMAGING GUIDED PORT INSERTION  10/04/2018  . IR REMOVAL TUN ACCESS W/ PORT W/O FL MOD SED  10/14/2018  . IR REPLACE G-TUBE SIMPLE WO FLUORO  11/03/2018  . IR REPLACE G-TUBE SIMPLE WO FLUORO  07/28/2019  . LAPAROSCOPIC INSERTION GASTROSTOMY TUBE Left 10/07/2018   Procedure: LAPAROSCOPIC  GASTROSTOMY TUBE;  Surgeon: Kinsinger, Arta Bruce, MD;  Location: [facility];  Service: General;  Laterality: Left;  Marland Kitchen MICROLARYNGOSCOPY N/A 09/27/2018   Procedure: MICRO DIRECT LARYNGOSCOPY WITH BIOPSY;  Surgeon: Leta Baptist, MD;  [facility] [place], [name], [address] Phone: [phone]   Fax:  [phone]  Physical Therapy Treatment  Patient Details  Name: Logan Price MRN: [id number] Date of Birth: September 15, 1945 Referring Provider (PT): Dr. Isidore Moos   Encounter Date: 08/07/2019  PT End of Session - 08/07/19 1421    Visit Number  3    Number of Visits  13    Date for PT Re-Evaluation  09/10/19    Progress Note Due on Visit  10    PT Start Time  1400    PT Stop Time  1440    PT Time Calculation (min)  40 min    Activity Tolerance  Patient tolerated treatment well    Behavior During Therapy  [facility] for tasks assessed/performed       Past Medical History:  Diagnosis Date  . Diverticulitis   . Dysrhythmia 09/2018   episode of SVT while in hospital  . 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 GERD (gastroesophageal reflux disease)   . 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 HTN (hypertension)   . Hyperlipidemia     Past Surgical History:  Procedure Laterality Date  . BIOPSY  05/04/2015   Procedure: BIOPSY;  Surgeon: Danie Binder, MD;  [facility];  Service: Endoscopy;;  bx's of ileocecal valve   . BRONCHIAL BRUSHINGS  04/29/2019   Procedure: BRONCHIAL BRUSHINGS;  Surgeon: Garner Nash, DO;  Location: [facility];  Service: Pulmonary;;  . BRONCHIAL NEEDLE ASPIRATION BIOPSY  04/29/2019   Procedure: BRONCHIAL NEEDLE ASPIRATION BIOPSIES;  Surgeon: Garner Nash, DO;  Location: [facility];  Service: Pulmonary;;  . BRONCHIAL WASHINGS  04/29/2019   Procedure: BRONCHIAL WASHINGS;  Surgeon: Garner Nash, DO;  [facility];  Service: Pulmonary;;  . COLONOSCOPY WITH PROPOFOL N/A 05/04/2015   Dr. Oneida Alar: normal appearing ileum with prominent IC valve with tubular adenomas, moderate diverticulosis in sigmoid colon, ascending colon, and retum. Moderate sized internal hemorrhoids. Surveillance in 5 years  . ELECTROMAGNETIC  NAVIGATION BROCHOSCOPY  04/29/2019   Procedure: NAVIGATION BRONCHOSCOPY;  Surgeon: Garner Nash, DO;  Location: [facility];  Service: Pulmonary;;  . ESOPHAGOGASTRODUODENOSCOPY (EGD) WITH PROPOFOL N/A 12/19/2016   Procedure: ESOPHAGOGASTRODUODENOSCOPY (EGD) WITH PROPOFOL;  Surgeon: Danie Binder, MD;  [facility];  Service: Endoscopy;  Laterality: N/A;  11:30am  . FLEXIBLE SIGMOIDOSCOPY N/A 12/10/2015   hemorrhoid banding X 3   . HEMORRHOID BANDING N/A 12/10/2015   Procedure: HEMORRHOID BANDING;  Surgeon: Danie Binder, MD;  [facility];  Service: Endoscopy;  Laterality: N/A;  1:30 PM  . INSERTION OF SUPRAPUBIC CATHETER N/A 02/24/2019   Procedure: INSERTION OF SUPRAPUBIC CATHETER;  Surgeon: Kathie Rhodes, MD;  [facility];  Service: Urology;  Laterality: N/A;  . IR CM INJ ANY COLONIC TUBE W/FLUORO  10/30/2018  . IR GASTROSTOMY TUBE MOD SED  10/04/2018  . IR IMAGING GUIDED PORT INSERTION  10/04/2018  . IR REMOVAL TUN ACCESS W/ PORT W/O FL MOD SED  10/14/2018  . IR REPLACE G-TUBE SIMPLE WO FLUORO  11/03/2018  . IR REPLACE G-TUBE SIMPLE WO FLUORO  07/28/2019  . LAPAROSCOPIC INSERTION GASTROSTOMY TUBE Left 10/07/2018   Procedure: LAPAROSCOPIC  GASTROSTOMY TUBE;  Surgeon: Kinsinger, Arta Bruce, MD;  Location: [facility];  Service: General;  Laterality: Left;  Marland Kitchen MICROLARYNGOSCOPY N/A 09/27/2018   Procedure: MICRO DIRECT LARYNGOSCOPY WITH BIOPSY;  Surgeon: Leta Baptist, MD;

## 2019-08-12 ENCOUNTER — Other Ambulatory Visit: Payer: Self-pay

## 2019-08-12 ENCOUNTER — Encounter (HOSPITAL_COMMUNITY): Payer: Self-pay | Admitting: Physical Therapy

## 2019-08-12 ENCOUNTER — Inpatient Hospital Stay: Payer: Medicare Other | Attending: Hematology

## 2019-08-12 ENCOUNTER — Ambulatory Visit (HOSPITAL_COMMUNITY): Payer: Medicare Other | Admitting: Physical Therapy

## 2019-08-12 DIAGNOSIS — M6281 Muscle weakness (generalized): Secondary | ICD-10-CM | POA: Diagnosis not present

## 2019-08-12 DIAGNOSIS — R2689 Other abnormalities of gait and mobility: Secondary | ICD-10-CM

## 2019-08-12 DIAGNOSIS — R1313 Dysphagia, pharyngeal phase: Secondary | ICD-10-CM | POA: Diagnosis not present

## 2019-08-12 NOTE — Therapy (Signed)
[place], [name], [address] Phone: [phone]   Fax:  [phone]  Physical Therapy Treatment  Patient Details  Name: Logan Price MRN: [id number] Date of Birth: 1945-09-01 Referring Provider (PT): Dr. Isidore Moos   Encounter Date: 08/12/2019  PT End of Session - 08/12/19 0952    Visit Number  4    Number of Visits  13    Date for PT Re-Evaluation  09/10/19    Progress Note Due on Visit  10    PT Start Time  0948    PT Stop Time  1030    PT Time Calculation (min)  42 min    Activity Tolerance  Patient tolerated treatment well;Patient limited by fatigue    Behavior During Therapy  [facility] for tasks assessed/performed       Past Medical History:  Diagnosis Date  . Diverticulitis   . Dysrhythmia 09/2018   episode of SVT while in hospital  . 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 GERD (gastroesophageal reflux disease)   . 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 HTN (hypertension)   . Hyperlipidemia     Past Surgical History:  Procedure Laterality Date  . BIOPSY  05/04/2015   Procedure: BIOPSY;  Surgeon: Danie Binder, MD;  [facility];  Service: Endoscopy;;  bx's of ileocecal valve   . BRONCHIAL BRUSHINGS  04/29/2019   Procedure: BRONCHIAL BRUSHINGS;  Surgeon: Garner Nash, DO;  Location: [place];  Service: Pulmonary;;  . BRONCHIAL NEEDLE ASPIRATION BIOPSY  04/29/2019   Procedure: BRONCHIAL NEEDLE ASPIRATION BIOPSIES;  Surgeon: Garner Nash, DO;  Location: [place];  Service: Pulmonary;;  . BRONCHIAL WASHINGS  04/29/2019   Procedure: BRONCHIAL WASHINGS;  Surgeon: Garner Nash, DO;  [facility];  Service: Pulmonary;;  . COLONOSCOPY WITH PROPOFOL N/A 05/04/2015   Dr. Oneida Alar: normal appearing ileum with prominent IC valve with tubular adenomas, moderate diverticulosis in sigmoid colon, ascending colon, and retum. Moderate sized internal hemorrhoids. Surveillance in  5 years  . ELECTROMAGNETIC NAVIGATION BROCHOSCOPY  04/29/2019   Procedure: NAVIGATION BRONCHOSCOPY;  Surgeon: Garner Nash, DO;  Location: [place] [facility];  Service: Pulmonary;;  . ESOPHAGOGASTRODUODENOSCOPY (EGD) WITH PROPOFOL N/A 12/19/2016   Procedure: ESOPHAGOGASTRODUODENOSCOPY (EGD) WITH PROPOFOL;  Surgeon: Danie Binder, MD;  [facility];  Service: Endoscopy;  Laterality: N/A;  11:30am  . FLEXIBLE SIGMOIDOSCOPY N/A 12/10/2015   hemorrhoid banding X 3   . HEMORRHOID BANDING N/A 12/10/2015   Procedure: HEMORRHOID BANDING;  Surgeon: Danie Binder, MD;  [facility];  Service: Endoscopy;  Laterality: N/A;  1:30 PM  . INSERTION OF SUPRAPUBIC CATHETER N/A 02/24/2019   Procedure: INSERTION OF SUPRAPUBIC CATHETER;  Surgeon: Kathie Rhodes, MD;  [facility];  Service: Urology;  Laterality: N/A;  . IR CM INJ ANY COLONIC TUBE W/FLUORO  10/30/2018  . IR GASTROSTOMY TUBE MOD SED  10/04/2018  . IR IMAGING GUIDED PORT INSERTION  10/04/2018  . IR REMOVAL TUN ACCESS W/ PORT W/O FL MOD SED  10/14/2018  . IR REPLACE G-TUBE SIMPLE WO FLUORO  11/03/2018  . IR REPLACE G-TUBE SIMPLE WO FLUORO  07/28/2019  . LAPAROSCOPIC INSERTION GASTROSTOMY TUBE Left 10/07/2018   Procedure: LAPAROSCOPIC  GASTROSTOMY TUBE;  Surgeon: Kinsinger, Arta Bruce, MD;  Location: [place];  Service: General;  Laterality: Left;  Marland Kitchen MICROLARYNGOSCOPY N/A 09/27/2018   Procedure: MICRO DIRECT LARYNGOSCOPY WITH BIOPSY;  Surgeon: Benjamine Mola,  [place], [name], [address] Phone: [phone]   Fax:  [phone]  Physical Therapy Treatment  Patient Details  Name: Logan Price MRN: [id number] Date of Birth: 1945-09-01 Referring Provider (PT): Dr. Isidore Moos   Encounter Date: 08/12/2019  PT End of Session - 08/12/19 0952    Visit Number  4    Number of Visits  13    Date for PT Re-Evaluation  09/10/19    Progress Note Due on Visit  10    PT Start Time  0948    PT Stop Time  1030    PT Time Calculation (min)  42 min    Activity Tolerance  Patient tolerated treatment well;Patient limited by fatigue    Behavior During Therapy  [facility] for tasks assessed/performed       Past Medical History:  Diagnosis Date  . Diverticulitis   . Dysrhythmia 09/2018   episode of SVT while in hospital  . 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 GERD (gastroesophageal reflux disease)   . 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 HTN (hypertension)   . Hyperlipidemia     Past Surgical History:  Procedure Laterality Date  . BIOPSY  05/04/2015   Procedure: BIOPSY;  Surgeon: Danie Binder, MD;  [facility];  Service: Endoscopy;;  bx's of ileocecal valve   . BRONCHIAL BRUSHINGS  04/29/2019   Procedure: BRONCHIAL BRUSHINGS;  Surgeon: Garner Nash, DO;  Location: [place];  Service: Pulmonary;;  . BRONCHIAL NEEDLE ASPIRATION BIOPSY  04/29/2019   Procedure: BRONCHIAL NEEDLE ASPIRATION BIOPSIES;  Surgeon: Garner Nash, DO;  Location: [place];  Service: Pulmonary;;  . BRONCHIAL WASHINGS  04/29/2019   Procedure: BRONCHIAL WASHINGS;  Surgeon: Garner Nash, DO;  [facility];  Service: Pulmonary;;  . COLONOSCOPY WITH PROPOFOL N/A 05/04/2015   Dr. Oneida Alar: normal appearing ileum with prominent IC valve with tubular adenomas, moderate diverticulosis in sigmoid colon, ascending colon, and retum. Moderate sized internal hemorrhoids. Surveillance in  5 years  . ELECTROMAGNETIC NAVIGATION BROCHOSCOPY  04/29/2019   Procedure: NAVIGATION BRONCHOSCOPY;  Surgeon: Garner Nash, DO;  Location: [place] [facility];  Service: Pulmonary;;  . ESOPHAGOGASTRODUODENOSCOPY (EGD) WITH PROPOFOL N/A 12/19/2016   Procedure: ESOPHAGOGASTRODUODENOSCOPY (EGD) WITH PROPOFOL;  Surgeon: Danie Binder, MD;  [facility];  Service: Endoscopy;  Laterality: N/A;  11:30am  . FLEXIBLE SIGMOIDOSCOPY N/A 12/10/2015   hemorrhoid banding X 3   . HEMORRHOID BANDING N/A 12/10/2015   Procedure: HEMORRHOID BANDING;  Surgeon: Danie Binder, MD;  [facility];  Service: Endoscopy;  Laterality: N/A;  1:30 PM  . INSERTION OF SUPRAPUBIC CATHETER N/A 02/24/2019   Procedure: INSERTION OF SUPRAPUBIC CATHETER;  Surgeon: Kathie Rhodes, MD;  [facility];  Service: Urology;  Laterality: N/A;  . IR CM INJ ANY COLONIC TUBE W/FLUORO  10/30/2018  . IR GASTROSTOMY TUBE MOD SED  10/04/2018  . IR IMAGING GUIDED PORT INSERTION  10/04/2018  . IR REMOVAL TUN ACCESS W/ PORT W/O FL MOD SED  10/14/2018  . IR REPLACE G-TUBE SIMPLE WO FLUORO  11/03/2018  . IR REPLACE G-TUBE SIMPLE WO FLUORO  07/28/2019  . LAPAROSCOPIC INSERTION GASTROSTOMY TUBE Left 10/07/2018   Procedure: LAPAROSCOPIC  GASTROSTOMY TUBE;  Surgeon: Kinsinger, Arta Bruce, MD;  Location: [place];  Service: General;  Laterality: Left;  Marland Kitchen MICROLARYNGOSCOPY N/A 09/27/2018   Procedure: MICRO DIRECT LARYNGOSCOPY WITH BIOPSY;  Surgeon: Benjamine Mola,  [place], [name], [address] Phone: [phone]   Fax:  [phone]  Physical Therapy Treatment  Patient Details  Name: Logan Price MRN: [id number] Date of Birth: 1945-09-01 Referring Provider (PT): Dr. Isidore Moos   Encounter Date: 08/12/2019  PT End of Session - 08/12/19 0952    Visit Number  4    Number of Visits  13    Date for PT Re-Evaluation  09/10/19    Progress Note Due on Visit  10    PT Start Time  0948    PT Stop Time  1030    PT Time Calculation (min)  42 min    Activity Tolerance  Patient tolerated treatment well;Patient limited by fatigue    Behavior During Therapy  [facility] for tasks assessed/performed       Past Medical History:  Diagnosis Date  . Diverticulitis   . Dysrhythmia 09/2018   episode of SVT while in hospital  . 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 GERD (gastroesophageal reflux disease)   . 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 HTN (hypertension)   . Hyperlipidemia     Past Surgical History:  Procedure Laterality Date  . BIOPSY  05/04/2015   Procedure: BIOPSY;  Surgeon: Danie Binder, MD;  [facility];  Service: Endoscopy;;  bx's of ileocecal valve   . BRONCHIAL BRUSHINGS  04/29/2019   Procedure: BRONCHIAL BRUSHINGS;  Surgeon: Garner Nash, DO;  Location: [place];  Service: Pulmonary;;  . BRONCHIAL NEEDLE ASPIRATION BIOPSY  04/29/2019   Procedure: BRONCHIAL NEEDLE ASPIRATION BIOPSIES;  Surgeon: Garner Nash, DO;  Location: [place];  Service: Pulmonary;;  . BRONCHIAL WASHINGS  04/29/2019   Procedure: BRONCHIAL WASHINGS;  Surgeon: Garner Nash, DO;  [facility];  Service: Pulmonary;;  . COLONOSCOPY WITH PROPOFOL N/A 05/04/2015   Dr. Oneida Alar: normal appearing ileum with prominent IC valve with tubular adenomas, moderate diverticulosis in sigmoid colon, ascending colon, and retum. Moderate sized internal hemorrhoids. Surveillance in  5 years  . ELECTROMAGNETIC NAVIGATION BROCHOSCOPY  04/29/2019   Procedure: NAVIGATION BRONCHOSCOPY;  Surgeon: Garner Nash, DO;  Location: [place] [facility];  Service: Pulmonary;;  . ESOPHAGOGASTRODUODENOSCOPY (EGD) WITH PROPOFOL N/A 12/19/2016   Procedure: ESOPHAGOGASTRODUODENOSCOPY (EGD) WITH PROPOFOL;  Surgeon: Danie Binder, MD;  [facility];  Service: Endoscopy;  Laterality: N/A;  11:30am  . FLEXIBLE SIGMOIDOSCOPY N/A 12/10/2015   hemorrhoid banding X 3   . HEMORRHOID BANDING N/A 12/10/2015   Procedure: HEMORRHOID BANDING;  Surgeon: Danie Binder, MD;  [facility];  Service: Endoscopy;  Laterality: N/A;  1:30 PM  . INSERTION OF SUPRAPUBIC CATHETER N/A 02/24/2019   Procedure: INSERTION OF SUPRAPUBIC CATHETER;  Surgeon: Kathie Rhodes, MD;  [facility];  Service: Urology;  Laterality: N/A;  . IR CM INJ ANY COLONIC TUBE W/FLUORO  10/30/2018  . IR GASTROSTOMY TUBE MOD SED  10/04/2018  . IR IMAGING GUIDED PORT INSERTION  10/04/2018  . IR REMOVAL TUN ACCESS W/ PORT W/O FL MOD SED  10/14/2018  . IR REPLACE G-TUBE SIMPLE WO FLUORO  11/03/2018  . IR REPLACE G-TUBE SIMPLE WO FLUORO  07/28/2019  . LAPAROSCOPIC INSERTION GASTROSTOMY TUBE Left 10/07/2018   Procedure: LAPAROSCOPIC  GASTROSTOMY TUBE;  Surgeon: Kinsinger, Arta Bruce, MD;  Location: [place];  Service: General;  Laterality: Left;  Marland Kitchen MICROLARYNGOSCOPY N/A 09/27/2018   Procedure: MICRO DIRECT LARYNGOSCOPY WITH BIOPSY;  Surgeon: Benjamine Mola,  glass opacity present on imaging of lung 04/23/2019  . 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 Tracheostomy tube present ([place]) 04/23/2019  . PEG (percutaneous endoscopic gastrostomy) adjustment/replacement/removal ([place]) 03/08/2019  . 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 Pleural effusion   . 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[place])   . Bacteremia   . Leukocytosis   . Normocytic anemia   . Protein-calorie malnutrition, severe 10/08/2018  . PSVT (paroxysmal supraventricular tachycardia) ([employer]) 10/03/2018  . Atrial fibrillation ([place]) 10/03/2018  . Goals of care, counseling/discussion   . Dysphagia   . Loss of weight   . 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 Gastritis determined by endoscopy   . Esophageal dysphagia   . 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 Globus sensation 12/12/2016  . Smokes with greater than 40 pack year history 10/20/2016  . Hemorrhoids, internal, with bleeding 10/06/2015  . History of adenomatous polyp of colon 04/14/2015  . Vitamin D deficiency 10/09/2014  . HLD (hyperlipidemia) 10/08/2012  . GERD (gastroesophageal reflux disease) 10/08/2012  . HTN (hypertension)   . Diverticulitis 10/25/2009    10:32 AM, 08/12/19 Josue Hector PT DPT  Physical Therapist with [facility]  [phone] [place] [address] [place], [name], [address] Phone: [phone]   Fax:  [phone]  Name: WAYMOND MEADOR MRN: [id number] Date of Birth: [birth year]

## 2019-08-12 NOTE — Progress Notes (Signed)
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  Patient received PEG in July 2020.  Patient has completed radiation for bilateral lung nodules.   Met with patient and wife in clinic this pm.  Patient's food diary reviewed.  Drinking mostly boost plus for breakfast.  Eating lunch and dinner more solid foods at least 2 items (sliced [place] meat and fruit, meatballs and noodles, roasted [place] and mashed potatoes, chicken and dumplings, shrimp alfredo.  Drinking boost plus at dinner time as well.  Also drinking lipton tea (thickened) and another cup of fluids per food diary.    Patient has been giving 1 carton of [facility] via feeding tube and prostat 31m BID.  Does not like the taste of prostat orally.     SLP notes reviewed from 4/28.  Noted mechanical soft diet with nectar thick liquids via cup  Medications: reviewed  Labs: no new  Anthropometrics:   Weight 152 lb 6 oz today in clinic ,stable from 152 lb 2 oz on 4/20 149 lb on 3/23 2/23 148 lb 1/26 143 lb 6 oz   NUTRITION DIAGNOSIS: Inadequate oral intake improving   INTERVENTION:  Recommend stopping [employer] 1.5 peptide via feeding tube as well as prostat.  Has about 1 week supply of prostat and will use via tube until finished and will not reorder.   Instructed patient to flush feeding tube when not in use with 643mof water daily to keep patent. Discussed that tube removal will be based on number of factors including ability to maintain weight and hydration for at least 1 month without weight loss, scan results and plan of care.  Patient verbalized understanding.   Stressed importance of good hydration and encouraged patient to drinking thickened liquids between meals for additional hydration.   Continue ensure/boost plus 2-3 times per day. Reviewed importance of adding 1-2 snacks per day as no longer receiving tube feeding or prostat.   Patient has contact information     MONITORING, EVALUATION, GOAL: Patient will tolerate oral feeding and maintain weight and hydration   NEXT VISIT: 6 weeks, message sent to scheduling to arrange transportation.    Elvin Banker B. AlZenia ResidesRDOxfordLDConetoeegistered Dietitian [phone])

## 2019-08-13 ENCOUNTER — Ambulatory Visit (HOSPITAL_COMMUNITY): Payer: Medicare Other | Admitting: Physical Therapy

## 2019-08-13 ENCOUNTER — Encounter (HOSPITAL_COMMUNITY): Payer: Self-pay | Admitting: Physical Therapy

## 2019-08-13 DIAGNOSIS — R1313 Dysphagia, pharyngeal phase: Secondary | ICD-10-CM | POA: Diagnosis not present

## 2019-08-13 DIAGNOSIS — R2689 Other abnormalities of gait and mobility: Secondary | ICD-10-CM

## 2019-08-13 DIAGNOSIS — M6281 Muscle weakness (generalized): Secondary | ICD-10-CM | POA: Diagnosis not present

## 2019-08-13 NOTE — Therapy (Signed)
[place], [name], [address] Phone: [phone]   Fax:  [phone]  Physical Therapy Treatment  Patient Details  Name: Logan Price MRN: [id number] Date of Birth: [birth year] Referring Provider (PT): Dr. Isidore Moos   Encounter Date: 08/13/2019  PT End of Session - 08/13/19 0952    Visit Number  5    Number of Visits  13    Date for PT Re-Evaluation  09/10/19    Progress Note Due on Visit  10    PT Start Time  0950    PT Stop Time  1028    PT Time Calculation (min)  38 min    Activity Tolerance  Patient tolerated treatment well;Patient limited by fatigue    Behavior During Therapy  [facility] for tasks assessed/performed       Past Medical History:  Diagnosis Date  . Diverticulitis   . Dysrhythmia 09/2018   episode of SVT while in hospital  . 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 GERD (gastroesophageal reflux disease)   . 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 HTN (hypertension)   . Hyperlipidemia     Past Surgical History:  Procedure Laterality Date  . BIOPSY  05/04/2015   Procedure: BIOPSY;  Surgeon: Danie Binder, MD;  [facility];  Service: Endoscopy;;  bx's of ileocecal valve   . BRONCHIAL BRUSHINGS  04/29/2019   Procedure: BRONCHIAL BRUSHINGS;  Surgeon: Garner Nash, DO;  Location: [place];  Service: Pulmonary;;  . BRONCHIAL NEEDLE ASPIRATION BIOPSY  04/29/2019   Procedure: BRONCHIAL NEEDLE ASPIRATION BIOPSIES;  Surgeon: Garner Nash, DO;  Location: [place];  Service: Pulmonary;;  . BRONCHIAL WASHINGS  04/29/2019   Procedure: BRONCHIAL WASHINGS;  Surgeon: Garner Nash, DO;  [facility];  Service: Pulmonary;;  . COLONOSCOPY WITH PROPOFOL N/A 05/04/2015   Dr. Oneida Alar: normal appearing ileum with prominent IC valve with tubular adenomas, moderate diverticulosis in sigmoid colon, ascending colon, and retum. Moderate sized internal hemorrhoids. Surveillance in  5 years  . ELECTROMAGNETIC NAVIGATION BROCHOSCOPY  04/29/2019   Procedure: NAVIGATION BRONCHOSCOPY;  Surgeon: Garner Nash, DO;  Location: [facility];  Service: Pulmonary;;  . ESOPHAGOGASTRODUODENOSCOPY (EGD) WITH PROPOFOL N/A 12/19/2016   Procedure: ESOPHAGOGASTRODUODENOSCOPY (EGD) WITH PROPOFOL;  Surgeon: Danie Binder, MD;  [facility];  Service: Endoscopy;  Laterality: N/A;  11:30am  . FLEXIBLE SIGMOIDOSCOPY N/A 12/10/2015   hemorrhoid banding X 3   . HEMORRHOID BANDING N/A 12/10/2015   Procedure: HEMORRHOID BANDING;  Surgeon: Danie Binder, MD;  [facility];  Service: Endoscopy;  Laterality: N/A;  1:30 PM  . INSERTION OF SUPRAPUBIC CATHETER N/A 02/24/2019   Procedure: INSERTION OF SUPRAPUBIC CATHETER;  Surgeon: Kathie Rhodes, MD;  [facility];  Service: Urology;  Laterality: N/A;  . IR CM INJ ANY COLONIC TUBE W/FLUORO  10/30/2018  . IR GASTROSTOMY TUBE MOD SED  10/04/2018  . IR IMAGING GUIDED PORT INSERTION  10/04/2018  . IR REMOVAL TUN ACCESS W/ PORT W/O FL MOD SED  10/14/2018  . IR REPLACE G-TUBE SIMPLE WO FLUORO  11/03/2018  . IR REPLACE G-TUBE SIMPLE WO FLUORO  07/28/2019  . LAPAROSCOPIC INSERTION GASTROSTOMY TUBE Left 10/07/2018   Procedure: LAPAROSCOPIC  GASTROSTOMY TUBE;  Surgeon: Kinsinger, Arta Bruce, MD;  Location: [place];  Service: General;  Laterality: Left;  Marland Kitchen MICROLARYNGOSCOPY N/A 09/27/2018   Procedure: MICRO DIRECT LARYNGOSCOPY WITH BIOPSY;  Surgeon: Benjamine Mola,  [place], [name], [address] Phone: [phone]   Fax:  [phone]  Physical Therapy Treatment  Patient Details  Name: Logan Price MRN: [id number] Date of Birth: [birth year] Referring Provider (PT): Dr. Isidore Moos   Encounter Date: 08/13/2019  PT End of Session - 08/13/19 0952    Visit Number  5    Number of Visits  13    Date for PT Re-Evaluation  09/10/19    Progress Note Due on Visit  10    PT Start Time  0950    PT Stop Time  1028    PT Time Calculation (min)  38 min    Activity Tolerance  Patient tolerated treatment well;Patient limited by fatigue    Behavior During Therapy  [facility] for tasks assessed/performed       Past Medical History:  Diagnosis Date  . Diverticulitis   . Dysrhythmia 09/2018   episode of SVT while in hospital  . 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 GERD (gastroesophageal reflux disease)   . 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 HTN (hypertension)   . Hyperlipidemia     Past Surgical History:  Procedure Laterality Date  . BIOPSY  05/04/2015   Procedure: BIOPSY;  Surgeon: Danie Binder, MD;  [facility];  Service: Endoscopy;;  bx's of ileocecal valve   . BRONCHIAL BRUSHINGS  04/29/2019   Procedure: BRONCHIAL BRUSHINGS;  Surgeon: Garner Nash, DO;  Location: [place];  Service: Pulmonary;;  . BRONCHIAL NEEDLE ASPIRATION BIOPSY  04/29/2019   Procedure: BRONCHIAL NEEDLE ASPIRATION BIOPSIES;  Surgeon: Garner Nash, DO;  Location: [place];  Service: Pulmonary;;  . BRONCHIAL WASHINGS  04/29/2019   Procedure: BRONCHIAL WASHINGS;  Surgeon: Garner Nash, DO;  [facility];  Service: Pulmonary;;  . COLONOSCOPY WITH PROPOFOL N/A 05/04/2015   Dr. Oneida Alar: normal appearing ileum with prominent IC valve with tubular adenomas, moderate diverticulosis in sigmoid colon, ascending colon, and retum. Moderate sized internal hemorrhoids. Surveillance in  5 years  . ELECTROMAGNETIC NAVIGATION BROCHOSCOPY  04/29/2019   Procedure: NAVIGATION BRONCHOSCOPY;  Surgeon: Garner Nash, DO;  Location: [facility];  Service: Pulmonary;;  . ESOPHAGOGASTRODUODENOSCOPY (EGD) WITH PROPOFOL N/A 12/19/2016   Procedure: ESOPHAGOGASTRODUODENOSCOPY (EGD) WITH PROPOFOL;  Surgeon: Danie Binder, MD;  [facility];  Service: Endoscopy;  Laterality: N/A;  11:30am  . FLEXIBLE SIGMOIDOSCOPY N/A 12/10/2015   hemorrhoid banding X 3   . HEMORRHOID BANDING N/A 12/10/2015   Procedure: HEMORRHOID BANDING;  Surgeon: Danie Binder, MD;  [facility];  Service: Endoscopy;  Laterality: N/A;  1:30 PM  . INSERTION OF SUPRAPUBIC CATHETER N/A 02/24/2019   Procedure: INSERTION OF SUPRAPUBIC CATHETER;  Surgeon: Kathie Rhodes, MD;  [facility];  Service: Urology;  Laterality: N/A;  . IR CM INJ ANY COLONIC TUBE W/FLUORO  10/30/2018  . IR GASTROSTOMY TUBE MOD SED  10/04/2018  . IR IMAGING GUIDED PORT INSERTION  10/04/2018  . IR REMOVAL TUN ACCESS W/ PORT W/O FL MOD SED  10/14/2018  . IR REPLACE G-TUBE SIMPLE WO FLUORO  11/03/2018  . IR REPLACE G-TUBE SIMPLE WO FLUORO  07/28/2019  . LAPAROSCOPIC INSERTION GASTROSTOMY TUBE Left 10/07/2018   Procedure: LAPAROSCOPIC  GASTROSTOMY TUBE;  Surgeon: Kinsinger, Arta Bruce, MD;  Location: [place];  Service: General;  Laterality: Left;  Marland Kitchen MICROLARYNGOSCOPY N/A 09/27/2018   Procedure: MICRO DIRECT LARYNGOSCOPY WITH BIOPSY;  Surgeon: Benjamine Mola,  Tracheostomy tube present ([place]) 04/23/2019  . PEG (percutaneous endoscopic gastrostomy) adjustment/replacement/removal ([place]) 03/08/2019  . 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  Pleural effusion   . 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[place])   . Bacteremia   . Leukocytosis   . Normocytic anemia   . Protein-calorie malnutrition, severe 10/08/2018  . PSVT (paroxysmal supraventricular tachycardia) ([place]) 10/03/2018  . Atrial fibrillation ([place]) 10/03/2018  . Goals of care, counseling/discussion   . Dysphagia   . Loss of weight   . 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 Gastritis determined by endoscopy   . Esophageal dysphagia   . 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 Globus sensation 12/12/2016  . Smokes with greater than 40 pack year history 10/20/2016  . Hemorrhoids, internal, with bleeding 10/06/2015  . History of adenomatous polyp of colon 04/14/2015  . Vitamin D deficiency 10/09/2014  . HLD (hyperlipidemia) 10/08/2012  . GERD (gastroesophageal reflux disease) 10/08/2012  . HTN (hypertension)   . Diverticulitis 10/25/2009   Clarene Critchley PT, DPT 10:35 AM, 08/13/19 [place], [name], [address] Phone: [phone]   Fax:  [phone]  Name: Logan Price MRN: [id number] Date of Birth: [birth year]  [place], [name], [address] Phone: [phone]   Fax:  [phone]  Physical Therapy Treatment  Patient Details  Name: Logan Price MRN: [id number] Date of Birth: [birth year] Referring Provider (PT): Dr. Isidore Moos   Encounter Date: 08/13/2019  PT End of Session - 08/13/19 0952    Visit Number  5    Number of Visits  13    Date for PT Re-Evaluation  09/10/19    Progress Note Due on Visit  10    PT Start Time  0950    PT Stop Time  1028    PT Time Calculation (min)  38 min    Activity Tolerance  Patient tolerated treatment well;Patient limited by fatigue    Behavior During Therapy  [facility] for tasks assessed/performed       Past Medical History:  Diagnosis Date  . Diverticulitis   . Dysrhythmia 09/2018   episode of SVT while in hospital  . 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 GERD (gastroesophageal reflux disease)   . 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 HTN (hypertension)   . Hyperlipidemia     Past Surgical History:  Procedure Laterality Date  . BIOPSY  05/04/2015   Procedure: BIOPSY;  Surgeon: Danie Binder, MD;  [facility];  Service: Endoscopy;;  bx's of ileocecal valve   . BRONCHIAL BRUSHINGS  04/29/2019   Procedure: BRONCHIAL BRUSHINGS;  Surgeon: Garner Nash, DO;  Location: [place];  Service: Pulmonary;;  . BRONCHIAL NEEDLE ASPIRATION BIOPSY  04/29/2019   Procedure: BRONCHIAL NEEDLE ASPIRATION BIOPSIES;  Surgeon: Garner Nash, DO;  Location: [place];  Service: Pulmonary;;  . BRONCHIAL WASHINGS  04/29/2019   Procedure: BRONCHIAL WASHINGS;  Surgeon: Garner Nash, DO;  [facility];  Service: Pulmonary;;  . COLONOSCOPY WITH PROPOFOL N/A 05/04/2015   Dr. Oneida Alar: normal appearing ileum with prominent IC valve with tubular adenomas, moderate diverticulosis in sigmoid colon, ascending colon, and retum. Moderate sized internal hemorrhoids. Surveillance in  5 years  . ELECTROMAGNETIC NAVIGATION BROCHOSCOPY  04/29/2019   Procedure: NAVIGATION BRONCHOSCOPY;  Surgeon: Garner Nash, DO;  Location: [facility];  Service: Pulmonary;;  . ESOPHAGOGASTRODUODENOSCOPY (EGD) WITH PROPOFOL N/A 12/19/2016   Procedure: ESOPHAGOGASTRODUODENOSCOPY (EGD) WITH PROPOFOL;  Surgeon: Danie Binder, MD;  [facility];  Service: Endoscopy;  Laterality: N/A;  11:30am  . FLEXIBLE SIGMOIDOSCOPY N/A 12/10/2015   hemorrhoid banding X 3   . HEMORRHOID BANDING N/A 12/10/2015   Procedure: HEMORRHOID BANDING;  Surgeon: Danie Binder, MD;  [facility];  Service: Endoscopy;  Laterality: N/A;  1:30 PM  . INSERTION OF SUPRAPUBIC CATHETER N/A 02/24/2019   Procedure: INSERTION OF SUPRAPUBIC CATHETER;  Surgeon: Kathie Rhodes, MD;  [facility];  Service: Urology;  Laterality: N/A;  . IR CM INJ ANY COLONIC TUBE W/FLUORO  10/30/2018  . IR GASTROSTOMY TUBE MOD SED  10/04/2018  . IR IMAGING GUIDED PORT INSERTION  10/04/2018  . IR REMOVAL TUN ACCESS W/ PORT W/O FL MOD SED  10/14/2018  . IR REPLACE G-TUBE SIMPLE WO FLUORO  11/03/2018  . IR REPLACE G-TUBE SIMPLE WO FLUORO  07/28/2019  . LAPAROSCOPIC INSERTION GASTROSTOMY TUBE Left 10/07/2018   Procedure: LAPAROSCOPIC  GASTROSTOMY TUBE;  Surgeon: Kinsinger, Arta Bruce, MD;  Location: [place];  Service: General;  Laterality: Left;  Marland Kitchen MICROLARYNGOSCOPY N/A 09/27/2018   Procedure: MICRO DIRECT LARYNGOSCOPY WITH BIOPSY;  Surgeon: Benjamine Mola,

## 2019-08-14 ENCOUNTER — Ambulatory Visit (HOSPITAL_COMMUNITY): Payer: PRIVATE HEALTH INSURANCE | Admitting: Physical Therapy

## 2019-08-18 ENCOUNTER — Encounter (HOSPITAL_COMMUNITY): Payer: Self-pay | Admitting: Physical Therapy

## 2019-08-18 ENCOUNTER — Other Ambulatory Visit: Payer: Self-pay

## 2019-08-18 ENCOUNTER — Ambulatory Visit (HOSPITAL_COMMUNITY): Payer: Medicare Other | Admitting: Physical Therapy

## 2019-08-18 DIAGNOSIS — M6281 Muscle weakness (generalized): Secondary | ICD-10-CM

## 2019-08-18 DIAGNOSIS — R1313 Dysphagia, pharyngeal phase: Secondary | ICD-10-CM | POA: Diagnosis not present

## 2019-08-18 DIAGNOSIS — R2689 Other abnormalities of gait and mobility: Secondary | ICD-10-CM

## 2019-08-18 NOTE — Therapy (Signed)
Head and neck cancer ([place]) 04/23/2019  .  Tracheostomy tube present ([place]) 04/23/2019  . PEG (percutaneous endoscopic gastrostomy) adjustment/replacement/removal ([place]) 03/08/2019  . 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 Pleural effusion   . 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[place])   . Bacteremia   . Leukocytosis   . Normocytic anemia   . Protein-calorie malnutrition, severe 10/08/2018  . PSVT (paroxysmal supraventricular tachycardia) ([place]) 10/03/2018  . Atrial fibrillation ([place]) 10/03/2018  . Goals of care, counseling/discussion   . Dysphagia   . Loss of weight   . 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 Gastritis determined by endoscopy   . Esophageal dysphagia   . 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 Globus sensation 12/12/2016  . Smokes with greater than 40 pack year history 10/20/2016  . Hemorrhoids, internal, with bleeding 10/06/2015  . History of adenomatous polyp of colon 04/14/2015  . Vitamin D deficiency 10/09/2014  . HLD (hyperlipidemia) 10/08/2012  . GERD (gastroesophageal reflux disease) 10/08/2012  . HTN (hypertension)   . Diverticulitis 10/25/2009    4:48 PM, 08/18/19 Josue Hector PT DPT  Physical Therapist with [facility]  [phone] [place] [address] [place], [name], [address] Phone: [phone]   Fax:  [phone]  Name: Logan Price MRN: [id number] Date of Birth: 12-08-1945  Head and neck cancer ([place]) 04/23/2019  .  Tracheostomy tube present ([place]) 04/23/2019  . PEG (percutaneous endoscopic gastrostomy) adjustment/replacement/removal ([place]) 03/08/2019  . 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 Pleural effusion   . 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[place])   . Bacteremia   . Leukocytosis   . Normocytic anemia   . Protein-calorie malnutrition, severe 10/08/2018  . PSVT (paroxysmal supraventricular tachycardia) ([place]) 10/03/2018  . Atrial fibrillation ([place]) 10/03/2018  . Goals of care, counseling/discussion   . Dysphagia   . Loss of weight   . 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 Gastritis determined by endoscopy   . Esophageal dysphagia   . 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 Globus sensation 12/12/2016  . Smokes with greater than 40 pack year history 10/20/2016  . Hemorrhoids, internal, with bleeding 10/06/2015  . History of adenomatous polyp of colon 04/14/2015  . Vitamin D deficiency 10/09/2014  . HLD (hyperlipidemia) 10/08/2012  . GERD (gastroesophageal reflux disease) 10/08/2012  . HTN (hypertension)   . Diverticulitis 10/25/2009    4:48 PM, 08/18/19 Josue Hector PT DPT  Physical Therapist with [facility]  [phone] [address] [place], [name], [address] Phone: [phone]   Fax:  [phone]  Name: Logan Price MRN: [id number] Date of Birth: 12-08-1945  [place], [name], [address] Phone: [phone]   Fax:  [phone]  Physical Therapy Treatment  Patient Details  Name: Logan Price MRN: [id number] Date of Birth: [birth year] Referring Provider (PT): Dr. Isidore Moos   Encounter Date: 08/18/2019  PT End of Session - 08/18/19 1434    Visit Number  6    Number of Visits  13    Date for PT Re-Evaluation  09/10/19    Progress Note Due on Visit  10    PT Start Time  1430    PT Stop Time  1510    PT Time Calculation (min)  40 min    Activity Tolerance  Patient tolerated treatment well;Patient limited by fatigue    Behavior During Therapy  [facility] for tasks assessed/performed       Past Medical History:  Diagnosis Date  . Diverticulitis   . Dysrhythmia 09/2018   episode of SVT while in hospital  . 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 GERD (gastroesophageal reflux disease)   . 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 HTN (hypertension)   . Hyperlipidemia     Past Surgical History:  Procedure Laterality Date  . BIOPSY  05/04/2015   Procedure: BIOPSY;  Surgeon: Danie Binder, MD;  [facility];  Service: Endoscopy;;  bx's of ileocecal valve   . BRONCHIAL BRUSHINGS  04/29/2019   Procedure: BRONCHIAL BRUSHINGS;  Surgeon: Garner Nash, DO;  Location: [place];  Service: Pulmonary;;  . BRONCHIAL NEEDLE ASPIRATION BIOPSY  04/29/2019   Procedure: BRONCHIAL NEEDLE ASPIRATION BIOPSIES;  Surgeon: Garner Nash, DO;  Location: [place];  Service: Pulmonary;;  . BRONCHIAL WASHINGS  04/29/2019   Procedure: BRONCHIAL WASHINGS;  Surgeon: Garner Nash, DO;  [facility];  Service: Pulmonary;;  . COLONOSCOPY WITH PROPOFOL N/A 05/04/2015   Dr. Oneida Alar: normal appearing ileum with prominent IC valve with tubular adenomas, moderate diverticulosis in sigmoid colon, ascending colon, and retum. Moderate sized internal hemorrhoids. Surveillance in  5 years  . ELECTROMAGNETIC NAVIGATION BROCHOSCOPY  04/29/2019   Procedure: NAVIGATION BRONCHOSCOPY;  Surgeon: Garner Nash, DO;  Location: [facility];  Service: Pulmonary;;  . ESOPHAGOGASTRODUODENOSCOPY (EGD) WITH PROPOFOL N/A 12/19/2016   Procedure: ESOPHAGOGASTRODUODENOSCOPY (EGD) WITH PROPOFOL;  Surgeon: Danie Binder, MD;  [facility];  Service: Endoscopy;  Laterality: N/A;  11:30am  . FLEXIBLE SIGMOIDOSCOPY N/A 12/10/2015   hemorrhoid banding X 3   . HEMORRHOID BANDING N/A 12/10/2015   Procedure: HEMORRHOID BANDING;  Surgeon: Danie Binder, MD;  [facility];  Service: Endoscopy;  Laterality: N/A;  1:30 PM  . INSERTION OF SUPRAPUBIC CATHETER N/A 02/24/2019   Procedure: INSERTION OF SUPRAPUBIC CATHETER;  Surgeon: Kathie Rhodes, MD;  [facility];  Service: Urology;  Laterality: N/A;  . IR CM INJ ANY COLONIC TUBE W/FLUORO  10/30/2018  . IR GASTROSTOMY TUBE MOD SED  10/04/2018  . IR IMAGING GUIDED PORT INSERTION  10/04/2018  . IR REMOVAL TUN ACCESS W/ PORT W/O FL MOD SED  10/14/2018  . IR REPLACE G-TUBE SIMPLE WO FLUORO  11/03/2018  . IR REPLACE G-TUBE SIMPLE WO FLUORO  07/28/2019  . LAPAROSCOPIC INSERTION GASTROSTOMY TUBE Left 10/07/2018   Procedure: LAPAROSCOPIC  GASTROSTOMY TUBE;  Surgeon: Kinsinger, Arta Bruce, MD;  Location: [place];  Service: General;  Laterality: Left;  Marland Kitchen MICROLARYNGOSCOPY N/A 09/27/2018   Procedure: MICRO DIRECT LARYNGOSCOPY WITH BIOPSY;  Surgeon: Benjamine Mola,  Head and neck cancer ([place]) 04/23/2019  .  Tracheostomy tube present ([place]) 04/23/2019  . PEG (percutaneous endoscopic gastrostomy) adjustment/replacement/removal ([place]) 03/08/2019  . 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 Pleural effusion   . 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[place])   . Bacteremia   . Leukocytosis   . Normocytic anemia   . Protein-calorie malnutrition, severe 10/08/2018  . PSVT (paroxysmal supraventricular tachycardia) ([place]) 10/03/2018  . Atrial fibrillation ([place]) 10/03/2018  . Goals of care, counseling/discussion   . Dysphagia   . Loss of weight   . 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 Gastritis determined by endoscopy   . Esophageal dysphagia   . 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 Globus sensation 12/12/2016  . Smokes with greater than 40 pack year history 10/20/2016  . Hemorrhoids, internal, with bleeding 10/06/2015  . History of adenomatous polyp of colon 04/14/2015  . Vitamin D deficiency 10/09/2014  . HLD (hyperlipidemia) 10/08/2012  . GERD (gastroesophageal reflux disease) 10/08/2012  . HTN (hypertension)   . Diverticulitis 10/25/2009    4:48 PM, 08/18/19 Josue Hector PT DPT  Physical Therapist with [facility]  [phone] [place] [address] [place], [name], [address] Phone: [phone]   Fax:  [phone]  Name: Logan Price MRN: [id number] Date of Birth: 12-08-1945

## 2019-08-20 ENCOUNTER — Ambulatory Visit (HOSPITAL_COMMUNITY): Payer: Medicare Other | Admitting: Speech Pathology

## 2019-08-20 ENCOUNTER — Encounter (HOSPITAL_COMMUNITY): Payer: Self-pay | Admitting: Speech Pathology

## 2019-08-20 ENCOUNTER — Other Ambulatory Visit: Payer: Self-pay

## 2019-08-20 DIAGNOSIS — R1313 Dysphagia, pharyngeal phase: Secondary | ICD-10-CM

## 2019-08-20 DIAGNOSIS — M6281 Muscle weakness (generalized): Secondary | ICD-10-CM | POA: Diagnosis not present

## 2019-08-20 DIAGNOSIS — R2689 Other abnormalities of gait and mobility: Secondary | ICD-10-CM | POA: Diagnosis not present

## 2019-08-20 NOTE — Therapy (Signed)
[name], [name], [address] Phone: [phone]   Fax:  [phone]  Speech Language Pathology Treatment  Patient Details  Name: Logan Price MRN: [id number] Date of Birth: 11-17-1945 Referring Provider (SLP): Eppie Gibson, MD   Encounter Date: 08/20/2019  End of Session - 08/20/19 1115    Visit Number  10    Number of Visits  12    Date for SLP Re-Evaluation  09/04/19    Authorization Type  Medicare    SLP Start Time  1030    SLP Stop Time   1105    SLP Time Calculation (min)  35 min    Activity Tolerance  Patient tolerated treatment well       Past Medical History:  Diagnosis Date  . Diverticulitis   . Dysrhythmia 09/2018   episode of SVT while in hospital  . 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 GERD (gastroesophageal reflux disease)   . 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 HTN (hypertension)   . Hyperlipidemia     Past Surgical History:  Procedure Laterality Date  . BIOPSY  05/04/2015   Procedure: BIOPSY;  Surgeon: Danie Binder, MD;  [facility];  Service: Endoscopy;;  bx's of ileocecal valve   . BRONCHIAL BRUSHINGS  04/29/2019   Procedure: BRONCHIAL BRUSHINGS;  Surgeon: Garner Nash, DO;  Location: [facility];  Service: Pulmonary;;  . BRONCHIAL NEEDLE ASPIRATION BIOPSY  04/29/2019   Procedure: BRONCHIAL NEEDLE ASPIRATION BIOPSIES;  Surgeon: Garner Nash, DO;  Location: [facility];  Service: Pulmonary;;  . BRONCHIAL WASHINGS  04/29/2019   Procedure: BRONCHIAL WASHINGS;  Surgeon: Garner Nash, DO;  [facility];  Service: Pulmonary;;  . COLONOSCOPY WITH PROPOFOL N/A 05/04/2015   Dr. Oneida Alar: normal appearing ileum with prominent IC valve with tubular adenomas, moderate diverticulosis in sigmoid colon, ascending colon, and retum. Moderate sized internal hemorrhoids. Surveillance in 5 years  . ELECTROMAGNETIC NAVIGATION BROCHOSCOPY  04/29/2019    Procedure: NAVIGATION BRONCHOSCOPY;  Surgeon: Garner Nash, DO;  Location: [facility];  Service: Pulmonary;;  . ESOPHAGOGASTRODUODENOSCOPY (EGD) WITH PROPOFOL N/A 12/19/2016   Procedure: ESOPHAGOGASTRODUODENOSCOPY (EGD) WITH PROPOFOL;  Surgeon: Danie Binder, MD;  [facility];  Service: Endoscopy;  Laterality: N/A;  11:30am  . FLEXIBLE SIGMOIDOSCOPY N/A 12/10/2015   hemorrhoid banding X 3   . HEMORRHOID BANDING N/A 12/10/2015   Procedure: HEMORRHOID BANDING;  Surgeon: Danie Binder, MD;  [facility];  Service: Endoscopy;  Laterality: N/A;  1:30 PM  . INSERTION OF SUPRAPUBIC CATHETER N/A 02/24/2019   Procedure: INSERTION OF SUPRAPUBIC CATHETER;  Surgeon: Kathie Rhodes, MD;  Location: [facility];  Service: Urology;  Laterality: N/A;  . IR CM INJ ANY COLONIC TUBE W/FLUORO  10/30/2018  . IR GASTROSTOMY TUBE MOD SED  10/04/2018  . IR IMAGING GUIDED PORT INSERTION  10/04/2018  . IR REMOVAL TUN ACCESS W/ PORT W/O FL MOD SED  10/14/2018  . IR REPLACE G-TUBE SIMPLE WO FLUORO  11/03/2018  . IR REPLACE G-TUBE SIMPLE WO FLUORO  07/28/2019  . LAPAROSCOPIC INSERTION GASTROSTOMY TUBE Left 10/07/2018   Procedure: LAPAROSCOPIC  GASTROSTOMY TUBE;  Surgeon: Kinsinger, Arta Bruce, MD;  Location: [facility];  Service: General;  Laterality: Left;  Marland Kitchen MICROLARYNGOSCOPY N/A 09/27/2018   Procedure: MICRO DIRECT LARYNGOSCOPY WITH BIOPSY;  Surgeon: Leta Baptist, MD;  Location: [facility];  Service: ENT;  Laterality: N/A;  . None  [name], [name], [address] Phone: [phone]   Fax:  [phone]  Speech Language Pathology Treatment  Patient Details  Name: Logan Price MRN: [id number] Date of Birth: 11-17-1945 Referring Provider (SLP): Eppie Gibson, MD   Encounter Date: 08/20/2019  End of Session - 08/20/19 1115    Visit Number  10    Number of Visits  12    Date for SLP Re-Evaluation  09/04/19    Authorization Type  Medicare    SLP Start Time  1030    SLP Stop Time   1105    SLP Time Calculation (min)  35 min    Activity Tolerance  Patient tolerated treatment well       Past Medical History:  Diagnosis Date  . Diverticulitis   . Dysrhythmia 09/2018   episode of SVT while in hospital  . 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 GERD (gastroesophageal reflux disease)   . 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 HTN (hypertension)   . Hyperlipidemia     Past Surgical History:  Procedure Laterality Date  . BIOPSY  05/04/2015   Procedure: BIOPSY;  Surgeon: Danie Binder, MD;  [facility];  Service: Endoscopy;;  bx's of ileocecal valve   . BRONCHIAL BRUSHINGS  04/29/2019   Procedure: BRONCHIAL BRUSHINGS;  Surgeon: Garner Nash, DO;  Location: [facility];  Service: Pulmonary;;  . BRONCHIAL NEEDLE ASPIRATION BIOPSY  04/29/2019   Procedure: BRONCHIAL NEEDLE ASPIRATION BIOPSIES;  Surgeon: Garner Nash, DO;  Location: [facility];  Service: Pulmonary;;  . BRONCHIAL WASHINGS  04/29/2019   Procedure: BRONCHIAL WASHINGS;  Surgeon: Garner Nash, DO;  [facility];  Service: Pulmonary;;  . COLONOSCOPY WITH PROPOFOL N/A 05/04/2015   Dr. Oneida Alar: normal appearing ileum with prominent IC valve with tubular adenomas, moderate diverticulosis in sigmoid colon, ascending colon, and retum. Moderate sized internal hemorrhoids. Surveillance in 5 years  . ELECTROMAGNETIC NAVIGATION BROCHOSCOPY  04/29/2019    Procedure: NAVIGATION BRONCHOSCOPY;  Surgeon: Garner Nash, DO;  Location: [facility];  Service: Pulmonary;;  . ESOPHAGOGASTRODUODENOSCOPY (EGD) WITH PROPOFOL N/A 12/19/2016   Procedure: ESOPHAGOGASTRODUODENOSCOPY (EGD) WITH PROPOFOL;  Surgeon: Danie Binder, MD;  [facility];  Service: Endoscopy;  Laterality: N/A;  11:30am  . FLEXIBLE SIGMOIDOSCOPY N/A 12/10/2015   hemorrhoid banding X 3   . HEMORRHOID BANDING N/A 12/10/2015   Procedure: HEMORRHOID BANDING;  Surgeon: Danie Binder, MD;  [facility];  Service: Endoscopy;  Laterality: N/A;  1:30 PM  . INSERTION OF SUPRAPUBIC CATHETER N/A 02/24/2019   Procedure: INSERTION OF SUPRAPUBIC CATHETER;  Surgeon: Kathie Rhodes, MD;  [facility];  Service: Urology;  Laterality: N/A;  . IR CM INJ ANY COLONIC TUBE W/FLUORO  10/30/2018  . IR GASTROSTOMY TUBE MOD SED  10/04/2018  . IR IMAGING GUIDED PORT INSERTION  10/04/2018  . IR REMOVAL TUN ACCESS W/ PORT W/O FL MOD SED  10/14/2018  . IR REPLACE G-TUBE SIMPLE WO FLUORO  11/03/2018  . IR REPLACE G-TUBE SIMPLE WO FLUORO  07/28/2019  . LAPAROSCOPIC INSERTION GASTROSTOMY TUBE Left 10/07/2018   Procedure: LAPAROSCOPIC  GASTROSTOMY TUBE;  Surgeon: Kinsinger, Arta Bruce, MD;  Location: [facility];  Service: General;  Laterality: Left;  Marland Kitchen MICROLARYNGOSCOPY N/A 09/27/2018   Procedure: MICRO DIRECT LARYNGOSCOPY WITH BIOPSY;  Surgeon: Leta Baptist, MD;  Location: [facility];  Service: ENT;  Laterality: N/A;  . None  Birth: Jan 17, 1946  Birth: Jan 17, 1946  Birth: Jan 17, 1946

## 2019-08-21 ENCOUNTER — Encounter (HOSPITAL_COMMUNITY): Payer: Self-pay | Admitting: Physical Therapy

## 2019-08-21 ENCOUNTER — Ambulatory Visit (HOSPITAL_COMMUNITY): Payer: Medicare Other | Admitting: Physical Therapy

## 2019-08-21 DIAGNOSIS — M6281 Muscle weakness (generalized): Secondary | ICD-10-CM | POA: Diagnosis not present

## 2019-08-21 DIAGNOSIS — R1313 Dysphagia, pharyngeal phase: Secondary | ICD-10-CM | POA: Diagnosis not present

## 2019-08-21 DIAGNOSIS — R2689 Other abnormalities of gait and mobility: Secondary | ICD-10-CM | POA: Diagnosis not present

## 2019-08-21 NOTE — Therapy (Signed)
[place], [name], [address] Phone: [phone]   Fax:  [phone]  Physical Therapy Treatment  Patient Details  Name: Logan Price MRN: [id number] Date of Birth: 1945-04-12 Referring Provider (PT): Dr. Isidore Moos   Encounter Date: 08/21/2019  PT End of Session - 08/21/19 1438    Visit Number  7    Number of Visits  13    Date for PT Re-Evaluation  09/10/19    Progress Note Due on Visit  10    PT Start Time  1432    PT Stop Time  1512    PT Time Calculation (min)  40 min    Activity Tolerance  Patient tolerated treatment well;Patient limited by fatigue    Behavior During Therapy  [facility] for tasks assessed/performed       Past Medical History:  Diagnosis Date  . Diverticulitis   . Dysrhythmia 09/2018   episode of SVT while in hospital  . 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 GERD (gastroesophageal reflux disease)   . 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 HTN (hypertension)   . Hyperlipidemia     Past Surgical History:  Procedure Laterality Date  . BIOPSY  05/04/2015   Procedure: BIOPSY;  Surgeon: Danie Binder, MD;  [facility];  Service: Endoscopy;;  bx's of ileocecal valve   . BRONCHIAL BRUSHINGS  04/29/2019   Procedure: BRONCHIAL BRUSHINGS;  Surgeon: Garner Nash, DO;  Location: [facility];  Service: Pulmonary;;  . BRONCHIAL NEEDLE ASPIRATION BIOPSY  04/29/2019   Procedure: BRONCHIAL NEEDLE ASPIRATION BIOPSIES;  Surgeon: Garner Nash, DO;  Location: [facility];  Service: Pulmonary;;  . BRONCHIAL WASHINGS  04/29/2019   Procedure: BRONCHIAL WASHINGS;  Surgeon: Garner Nash, DO;  [facility];  Service: Pulmonary;;  . COLONOSCOPY WITH PROPOFOL N/A 05/04/2015   Dr. Oneida Alar: normal appearing ileum with prominent IC valve with tubular adenomas, moderate diverticulosis in sigmoid colon, ascending colon, and retum. Moderate sized internal hemorrhoids. Surveillance in  5 years  . ELECTROMAGNETIC NAVIGATION BROCHOSCOPY  04/29/2019   Procedure: NAVIGATION BRONCHOSCOPY;  Surgeon: Garner Nash, DO;  Location: [facility];  Service: Pulmonary;;  . ESOPHAGOGASTRODUODENOSCOPY (EGD) WITH PROPOFOL N/A 12/19/2016   Procedure: ESOPHAGOGASTRODUODENOSCOPY (EGD) WITH PROPOFOL;  Surgeon: Danie Binder, MD;  [facility];  Service: Endoscopy;  Laterality: N/A;  11:30am  . FLEXIBLE SIGMOIDOSCOPY N/A 12/10/2015   hemorrhoid banding X 3   . HEMORRHOID BANDING N/A 12/10/2015   Procedure: HEMORRHOID BANDING;  Surgeon: Danie Binder, MD;  [facility];  Service: Endoscopy;  Laterality: N/A;  1:30 PM  . INSERTION OF SUPRAPUBIC CATHETER N/A 02/24/2019   Procedure: INSERTION OF SUPRAPUBIC CATHETER;  Surgeon: Kathie Rhodes, MD;  [facility];  Service: Urology;  Laterality: N/A;  . IR CM INJ ANY COLONIC TUBE W/FLUORO  10/30/2018  . IR GASTROSTOMY TUBE MOD SED  10/04/2018  . IR IMAGING GUIDED PORT INSERTION  10/04/2018  . IR REMOVAL TUN ACCESS W/ PORT W/O FL MOD SED  10/14/2018  . IR REPLACE G-TUBE SIMPLE WO FLUORO  11/03/2018  . IR REPLACE G-TUBE SIMPLE WO FLUORO  07/28/2019  . LAPAROSCOPIC INSERTION GASTROSTOMY TUBE Left 10/07/2018   Procedure: LAPAROSCOPIC  GASTROSTOMY TUBE;  Surgeon: Kinsinger, Arta Bruce, MD;  Location: [facility];  Service: General;  Laterality: Left;  Marland Kitchen MICROLARYNGOSCOPY N/A 09/27/2018   Procedure: MICRO DIRECT LARYNGOSCOPY WITH BIOPSY;  Surgeon: Benjamine Mola,  adjustment/replacement/removal ([place]) 03/08/2019   . 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 Pleural effusion   . 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[place])   . Bacteremia   . Leukocytosis   . Normocytic anemia   . Protein-calorie malnutrition, severe 10/08/2018  . PSVT (paroxysmal supraventricular tachycardia) ([place]) 10/03/2018  . Atrial fibrillation ([place]) 10/03/2018  . Goals of care, counseling/discussion   . Dysphagia   . Loss of weight   . 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 Gastritis determined by endoscopy   . Esophageal dysphagia   . 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 Globus sensation 12/12/2016  . Smokes with greater than 40 pack year history 10/20/2016  . Hemorrhoids, internal, with bleeding 10/06/2015  . History of adenomatous polyp of colon 04/14/2015  . Vitamin D deficiency 10/09/2014  . HLD (hyperlipidemia) 10/08/2012  . GERD (gastroesophageal reflux disease) 10/08/2012  . HTN (hypertension)   . Diverticulitis 10/25/2009    3:15 PM, 08/21/19 Mearl Latin PT, DPT Physical Therapist at [place], [name], [address] Phone: [phone]   Fax:  [phone]  Name: Logan Price MRN: [id number] Date of Birth: [birth year]  adjustment/replacement/removal ([place]) 03/08/2019   . 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 Pleural effusion   . 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[place])   . Bacteremia   . Leukocytosis   . Normocytic anemia   . Protein-calorie malnutrition, severe 10/08/2018  . PSVT (paroxysmal supraventricular tachycardia) ([place]) 10/03/2018  . Atrial fibrillation ([place]) 10/03/2018  . Goals of care, counseling/discussion   . Dysphagia   . Loss of weight   . 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 Gastritis determined by endoscopy   . Esophageal dysphagia   . 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 Globus sensation 12/12/2016  . Smokes with greater than 40 pack year history 10/20/2016  . Hemorrhoids, internal, with bleeding 10/06/2015  . History of adenomatous polyp of colon 04/14/2015  . Vitamin D deficiency 10/09/2014  . HLD (hyperlipidemia) 10/08/2012  . GERD (gastroesophageal reflux disease) 10/08/2012  . HTN (hypertension)   . Diverticulitis 10/25/2009    3:15 PM, 08/21/19 Mearl Latin PT, DPT Physical Therapist at [place], [name], [address] Phone: [phone]   Fax:  [phone]  Name: Logan Price MRN: [id number] Date of Birth: [birth year]  adjustment/replacement/removal ([place]) 03/08/2019   . 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 Pleural effusion   . 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[place])   . Bacteremia   . Leukocytosis   . Normocytic anemia   . Protein-calorie malnutrition, severe 10/08/2018  . PSVT (paroxysmal supraventricular tachycardia) ([place]) 10/03/2018  . Atrial fibrillation ([place]) 10/03/2018  . Goals of care, counseling/discussion   . Dysphagia   . Loss of weight   . 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 Gastritis determined by endoscopy   . Esophageal dysphagia   . 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 Globus sensation 12/12/2016  . Smokes with greater than 40 pack year history 10/20/2016  . Hemorrhoids, internal, with bleeding 10/06/2015  . History of adenomatous polyp of colon 04/14/2015  . Vitamin D deficiency 10/09/2014  . HLD (hyperlipidemia) 10/08/2012  . GERD (gastroesophageal reflux disease) 10/08/2012  . HTN (hypertension)   . Diverticulitis 10/25/2009    3:15 PM, 08/21/19 Mearl Latin PT, DPT Physical Therapist at [place], [name], [address] Phone: [phone]   Fax:  [phone]  Name: Logan Price MRN: [id number] Date of Birth: [birth year]

## 2019-08-26 ENCOUNTER — Encounter (HOSPITAL_COMMUNITY): Payer: Self-pay | Admitting: Physical Therapy

## 2019-08-26 ENCOUNTER — Ambulatory Visit (HOSPITAL_COMMUNITY): Payer: Medicare Other | Attending: Family | Admitting: Physical Therapy

## 2019-08-26 ENCOUNTER — Other Ambulatory Visit: Payer: Self-pay

## 2019-08-26 DIAGNOSIS — M6281 Muscle weakness (generalized): Secondary | ICD-10-CM | POA: Diagnosis present

## 2019-08-26 DIAGNOSIS — R2689 Other abnormalities of gait and mobility: Secondary | ICD-10-CM | POA: Diagnosis not present

## 2019-08-26 NOTE — Therapy (Signed)
[facility] [place], [name], [address] Phone: [phone]   Fax:  [phone]  Physical Therapy Treatment  Patient Details  Name: Logan Price MRN: [id number] Date of Birth: Oct 23, 1945 Referring Provider (PT): Dr. Isidore Moos   Encounter Date: 08/26/2019  PT End of Session - 08/26/19 1440    Visit Number  8    Number of Visits  13    Date for PT Re-Evaluation  09/10/19    Progress Note Due on Visit  10    PT Start Time  1435    PT Stop Time  1516    PT Time Calculation (min)  41 min    Activity Tolerance  Patient tolerated treatment well;Patient limited by fatigue    Behavior During Therapy  [facility] for tasks assessed/performed       Past Medical History:  Diagnosis Date  . Diverticulitis   . Dysrhythmia 09/2018   episode of SVT while in hospital  . 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 GERD (gastroesophageal reflux disease)   . 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 HTN (hypertension)   . Hyperlipidemia     Past Surgical History:  Procedure Laterality Date  . BIOPSY  05/04/2015   Procedure: BIOPSY;  Surgeon: Danie Binder, MD;  [facility];  Service: Endoscopy;;  bx's of ileocecal valve   . BRONCHIAL BRUSHINGS  04/29/2019   Procedure: BRONCHIAL BRUSHINGS;  Surgeon: Garner Nash, DO;  Location: [place];  Service: Pulmonary;;  . BRONCHIAL NEEDLE ASPIRATION BIOPSY  04/29/2019   Procedure: BRONCHIAL NEEDLE ASPIRATION BIOPSIES;  Surgeon: Garner Nash, DO;  Location: [place];  Service: Pulmonary;;  . BRONCHIAL WASHINGS  04/29/2019   Procedure: BRONCHIAL WASHINGS;  Surgeon: Garner Nash, DO;  [facility];  Service: Pulmonary;;  . COLONOSCOPY WITH PROPOFOL N/A 05/04/2015   Dr. Oneida Alar: normal appearing ileum with prominent IC valve with tubular adenomas, moderate diverticulosis in sigmoid colon, ascending colon, and retum. Moderate sized internal hemorrhoids. Surveillance in 5  years  . ELECTROMAGNETIC NAVIGATION BROCHOSCOPY  04/29/2019   Procedure: NAVIGATION BRONCHOSCOPY;  Surgeon: Garner Nash, DO;  Location: [place] [facility];  Service: Pulmonary;;  . ESOPHAGOGASTRODUODENOSCOPY (EGD) WITH PROPOFOL N/A 12/19/2016   Procedure: ESOPHAGOGASTRODUODENOSCOPY (EGD) WITH PROPOFOL;  Surgeon: Danie Binder, MD;  [facility];  Service: Endoscopy;  Laterality: N/A;  11:30am  . FLEXIBLE SIGMOIDOSCOPY N/A 12/10/2015   hemorrhoid banding X 3   . HEMORRHOID BANDING N/A 12/10/2015   Procedure: HEMORRHOID BANDING;  Surgeon: Danie Binder, MD;  [facility];  Service: Endoscopy;  Laterality: N/A;  1:30 PM  . INSERTION OF SUPRAPUBIC CATHETER N/A 02/24/2019   Procedure: INSERTION OF SUPRAPUBIC CATHETER;  Surgeon: Kathie Rhodes, MD;  [facility];  Service: Urology;  Laterality: N/A;  . IR CM INJ ANY COLONIC TUBE W/FLUORO  10/30/2018  . IR GASTROSTOMY TUBE MOD SED  10/04/2018  . IR IMAGING GUIDED PORT INSERTION  10/04/2018  . IR REMOVAL TUN ACCESS W/ PORT W/O FL MOD SED  10/14/2018  . IR REPLACE G-TUBE SIMPLE WO FLUORO  11/03/2018  . IR REPLACE G-TUBE SIMPLE WO FLUORO  07/28/2019  . LAPAROSCOPIC INSERTION GASTROSTOMY TUBE Left 10/07/2018   Procedure: LAPAROSCOPIC  GASTROSTOMY TUBE;  Surgeon: Kinsinger, Arta Bruce, MD;  Location: [place];  Service: General;  Laterality: Left;  Marland Kitchen MICROLARYNGOSCOPY N/A 09/27/2018   Procedure: MICRO DIRECT LARYNGOSCOPY WITH BIOPSY;  Surgeon: Benjamine Mola,  [facility] [place], [name], [address] Phone: [phone]   Fax:  [phone]  Physical Therapy Treatment  Patient Details  Name: Logan Price MRN: [id number] Date of Birth: Oct 23, 1945 Referring Provider (PT): Dr. Isidore Moos   Encounter Date: 08/26/2019  PT End of Session - 08/26/19 1440    Visit Number  8    Number of Visits  13    Date for PT Re-Evaluation  09/10/19    Progress Note Due on Visit  10    PT Start Time  1435    PT Stop Time  1516    PT Time Calculation (min)  41 min    Activity Tolerance  Patient tolerated treatment well;Patient limited by fatigue    Behavior During Therapy  [facility] for tasks assessed/performed       Past Medical History:  Diagnosis Date  . Diverticulitis   . Dysrhythmia 09/2018   episode of SVT while in hospital  . 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 GERD (gastroesophageal reflux disease)   . 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 HTN (hypertension)   . Hyperlipidemia     Past Surgical History:  Procedure Laterality Date  . BIOPSY  05/04/2015   Procedure: BIOPSY;  Surgeon: Danie Binder, MD;  [facility];  Service: Endoscopy;;  bx's of ileocecal valve   . BRONCHIAL BRUSHINGS  04/29/2019   Procedure: BRONCHIAL BRUSHINGS;  Surgeon: Garner Nash, DO;  Location: [place];  Service: Pulmonary;;  . BRONCHIAL NEEDLE ASPIRATION BIOPSY  04/29/2019   Procedure: BRONCHIAL NEEDLE ASPIRATION BIOPSIES;  Surgeon: Garner Nash, DO;  Location: [place];  Service: Pulmonary;;  . BRONCHIAL WASHINGS  04/29/2019   Procedure: BRONCHIAL WASHINGS;  Surgeon: Garner Nash, DO;  [facility];  Service: Pulmonary;;  . COLONOSCOPY WITH PROPOFOL N/A 05/04/2015   Dr. Oneida Alar: normal appearing ileum with prominent IC valve with tubular adenomas, moderate diverticulosis in sigmoid colon, ascending colon, and retum. Moderate sized internal hemorrhoids. Surveillance in 5  years  . ELECTROMAGNETIC NAVIGATION BROCHOSCOPY  04/29/2019   Procedure: NAVIGATION BRONCHOSCOPY;  Surgeon: Garner Nash, DO;  Location: [place] [facility];  Service: Pulmonary;;  . ESOPHAGOGASTRODUODENOSCOPY (EGD) WITH PROPOFOL N/A 12/19/2016   Procedure: ESOPHAGOGASTRODUODENOSCOPY (EGD) WITH PROPOFOL;  Surgeon: Danie Binder, MD;  [facility];  Service: Endoscopy;  Laterality: N/A;  11:30am  . FLEXIBLE SIGMOIDOSCOPY N/A 12/10/2015   hemorrhoid banding X 3   . HEMORRHOID BANDING N/A 12/10/2015   Procedure: HEMORRHOID BANDING;  Surgeon: Danie Binder, MD;  [facility];  Service: Endoscopy;  Laterality: N/A;  1:30 PM  . INSERTION OF SUPRAPUBIC CATHETER N/A 02/24/2019   Procedure: INSERTION OF SUPRAPUBIC CATHETER;  Surgeon: Kathie Rhodes, MD;  [facility];  Service: Urology;  Laterality: N/A;  . IR CM INJ ANY COLONIC TUBE W/FLUORO  10/30/2018  . IR GASTROSTOMY TUBE MOD SED  10/04/2018  . IR IMAGING GUIDED PORT INSERTION  10/04/2018  . IR REMOVAL TUN ACCESS W/ PORT W/O FL MOD SED  10/14/2018  . IR REPLACE G-TUBE SIMPLE WO FLUORO  11/03/2018  . IR REPLACE G-TUBE SIMPLE WO FLUORO  07/28/2019  . LAPAROSCOPIC INSERTION GASTROSTOMY TUBE Left 10/07/2018   Procedure: LAPAROSCOPIC  GASTROSTOMY TUBE;  Surgeon: Kinsinger, Arta Bruce, MD;  Location: [place];  Service: General;  Laterality: Left;  Marland Kitchen MICROLARYNGOSCOPY N/A 09/27/2018   Procedure: MICRO DIRECT LARYNGOSCOPY WITH BIOPSY;  Surgeon: Benjamine Mola,  Laryngeal  cancer ([place])   . Bacteremia   . Leukocytosis   . Normocytic anemia   . Protein-calorie malnutrition, severe 10/08/2018  . PSVT (paroxysmal supraventricular tachycardia) ([place]) 10/03/2018  . Atrial fibrillation ([place]) 10/03/2018  . Goals of care, counseling/discussion   . Dysphagia   . Loss of weight   . 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 Gastritis determined by endoscopy   . Esophageal dysphagia   . 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 Globus sensation 12/12/2016  . Smokes with greater than 40 pack year history 10/20/2016  . Hemorrhoids, internal, with bleeding 10/06/2015  . History of adenomatous polyp of colon 04/14/2015  . Vitamin D deficiency 10/09/2014  . HLD (hyperlipidemia) 10/08/2012  . GERD (gastroesophageal reflux disease) 10/08/2012  . HTN (hypertension)   . Diverticulitis 10/25/2009   3:21 PM, 08/26/19 Josue Hector PT DPT  Physical Therapist with [facility]  [phone] [place], [name], [address] Phone: [phone]   Fax:  [phone]  Name: Logan Price MRN: [id number] Date of Birth: 03-02-1946  [facility], [name], [address] Phone: [phone]   Fax:  [phone]  Physical Therapy Treatment  Patient Details  Name: Logan Price MRN: [id number] Date of Birth: Oct 23, 1945 Referring Provider (PT): Dr. Isidore Moos   Encounter Date: 08/26/2019  PT End of Session - 08/26/19 1440    Visit Number  8    Number of Visits  13    Date for PT Re-Evaluation  09/10/19    Progress Note Due on Visit  10    PT Start Time  1435    PT Stop Time  1516    PT Time Calculation (min)  41 min    Activity Tolerance  Patient tolerated treatment well;Patient limited by fatigue    Behavior During Therapy  [facility] for tasks assessed/performed       Past Medical History:  Diagnosis Date  . Diverticulitis   . Dysrhythmia 09/2018   episode of SVT while in hospital  . 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 GERD (gastroesophageal reflux disease)   . 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 HTN (hypertension)   . Hyperlipidemia     Past Surgical History:  Procedure Laterality Date  . BIOPSY  05/04/2015   Procedure: BIOPSY;  Surgeon: Danie Binder, MD;  [facility];  Service: Endoscopy;;  bx's of ileocecal valve   . BRONCHIAL BRUSHINGS  04/29/2019   Procedure: BRONCHIAL BRUSHINGS;  Surgeon: Garner Nash, DO;  Location: [place];  Service: Pulmonary;;  . BRONCHIAL NEEDLE ASPIRATION BIOPSY  04/29/2019   Procedure: BRONCHIAL NEEDLE ASPIRATION BIOPSIES;  Surgeon: Garner Nash, DO;  Location: [place];  Service: Pulmonary;;  . BRONCHIAL WASHINGS  04/29/2019   Procedure: BRONCHIAL WASHINGS;  Surgeon: Garner Nash, DO;  [facility];  Service: Pulmonary;;  . COLONOSCOPY WITH PROPOFOL N/A 05/04/2015   Dr. Oneida Alar: normal appearing ileum with prominent IC valve with tubular adenomas, moderate diverticulosis in sigmoid colon, ascending colon, and retum. Moderate sized internal hemorrhoids. Surveillance in 5  years  . ELECTROMAGNETIC NAVIGATION BROCHOSCOPY  04/29/2019   Procedure: NAVIGATION BRONCHOSCOPY;  Surgeon: Garner Nash, DO;  Location: [place] [facility];  Service: Pulmonary;;  . ESOPHAGOGASTRODUODENOSCOPY (EGD) WITH PROPOFOL N/A 12/19/2016   Procedure: ESOPHAGOGASTRODUODENOSCOPY (EGD) WITH PROPOFOL;  Surgeon: Danie Binder, MD;  [facility];  Service: Endoscopy;  Laterality: N/A;  11:30am  . FLEXIBLE SIGMOIDOSCOPY N/A 12/10/2015   hemorrhoid banding X 3   . HEMORRHOID BANDING N/A 12/10/2015   Procedure: HEMORRHOID BANDING;  Surgeon: Danie Binder, MD;  [facility];  Service: Endoscopy;  Laterality: N/A;  1:30 PM  . INSERTION OF SUPRAPUBIC CATHETER N/A 02/24/2019   Procedure: INSERTION OF SUPRAPUBIC CATHETER;  Surgeon: Kathie Rhodes, MD;  [facility];  Service: Urology;  Laterality: N/A;  . IR CM INJ ANY COLONIC TUBE W/FLUORO  10/30/2018  . IR GASTROSTOMY TUBE MOD SED  10/04/2018  . IR IMAGING GUIDED PORT INSERTION  10/04/2018  . IR REMOVAL TUN ACCESS W/ PORT W/O FL MOD SED  10/14/2018  . IR REPLACE G-TUBE SIMPLE WO FLUORO  11/03/2018  . IR REPLACE G-TUBE SIMPLE WO FLUORO  07/28/2019  . LAPAROSCOPIC INSERTION GASTROSTOMY TUBE Left 10/07/2018   Procedure: LAPAROSCOPIC  GASTROSTOMY TUBE;  Surgeon: Kinsinger, Arta Bruce, MD;  Location: [place];  Service: General;  Laterality: Left;  Marland Kitchen MICROLARYNGOSCOPY N/A 09/27/2018   Procedure: MICRO DIRECT LARYNGOSCOPY WITH BIOPSY;  Surgeon: Benjamine Mola,

## 2019-08-28 ENCOUNTER — Ambulatory Visit (HOSPITAL_COMMUNITY): Payer: Medicare Other | Admitting: Physical Therapy

## 2019-08-28 ENCOUNTER — Encounter (HOSPITAL_COMMUNITY): Payer: Self-pay | Admitting: Physical Therapy

## 2019-08-28 ENCOUNTER — Other Ambulatory Visit: Payer: Self-pay

## 2019-08-28 DIAGNOSIS — M6281 Muscle weakness (generalized): Secondary | ICD-10-CM

## 2019-08-28 DIAGNOSIS — R2689 Other abnormalities of gait and mobility: Secondary | ICD-10-CM

## 2019-08-28 NOTE — Therapy (Signed)
Atrial fibrillation ([place]) 10/03/2018  . Goals of care, counseling/discussion   . Dysphagia   . Loss of weight   . 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 Gastritis determined by endoscopy   . Esophageal dysphagia   . 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 Globus sensation 12/12/2016  . Smokes with greater than 40 pack year history 10/20/2016  . Hemorrhoids, internal, with bleeding 10/06/2015  . History of adenomatous polyp of colon 04/14/2015  . Vitamin D deficiency 10/09/2014  . HLD (hyperlipidemia) 10/08/2012  . GERD (gastroesophageal reflux disease) 10/08/2012  . HTN (hypertension)   . Diverticulitis 10/25/2009    2:26 PM, 08/28/19 Mearl Latin PT, DPT Physical Therapist at [facility] [place], [name], [address] Phone: [phone]   Fax:  [phone]  Name: Logan Price MRN: [id number] Date of Birth: [birth year]  [place], [name], [address] Phone: [phone]   Fax:  [phone]  Physical Therapy Treatment  Patient Details  Name: Logan Price MRN: [id number] Date of Birth: [birth year] Referring Provider (PT): Dr. Isidore Moos   Encounter Date: 08/28/2019  PT End of Session - 08/28/19 1352    Visit Number  9    Number of Visits  13    Date for PT Re-Evaluation  09/10/19    Progress Note Due on Visit  10    PT Start Time  1346    PT Stop Time  1425    PT Time Calculation (min)  39 min    Activity Tolerance  Patient tolerated treatment well;Patient limited by fatigue    Behavior During Therapy  [facility] for tasks assessed/performed       Past Medical History:  Diagnosis Date  . Diverticulitis   . Dysrhythmia 09/2018   episode of SVT while in hospital  . 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 GERD (gastroesophageal reflux disease)   . 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 HTN (hypertension)   . Hyperlipidemia     Past Surgical History:  Procedure Laterality Date  . BIOPSY  05/04/2015   Procedure: BIOPSY;  Surgeon: Danie Binder, MD;  [facility];  Service: Endoscopy;;  bx's of ileocecal valve   . BRONCHIAL BRUSHINGS  04/29/2019   Procedure: BRONCHIAL BRUSHINGS;  Surgeon: Garner Nash, DO;  Location: [place];  Service: Pulmonary;;  . BRONCHIAL NEEDLE ASPIRATION BIOPSY  04/29/2019   Procedure: BRONCHIAL NEEDLE ASPIRATION BIOPSIES;  Surgeon: Garner Nash, DO;  Location: [place];  Service: Pulmonary;;  . BRONCHIAL WASHINGS  04/29/2019   Procedure: BRONCHIAL WASHINGS;  Surgeon: Garner Nash, DO;  [facility];  Service: Pulmonary;;  . COLONOSCOPY WITH PROPOFOL N/A 05/04/2015   Dr. Oneida Alar: normal appearing ileum with prominent IC valve with tubular adenomas, moderate diverticulosis in sigmoid colon, ascending colon, and retum. Moderate sized internal hemorrhoids. Surveillance in 5  years  . ELECTROMAGNETIC NAVIGATION BROCHOSCOPY  04/29/2019   Procedure: NAVIGATION BRONCHOSCOPY;  Surgeon: Garner Nash, DO;  Location: [place] [facility];  Service: Pulmonary;;  . ESOPHAGOGASTRODUODENOSCOPY (EGD) WITH PROPOFOL N/A 12/19/2016   Procedure: ESOPHAGOGASTRODUODENOSCOPY (EGD) WITH PROPOFOL;  Surgeon: Danie Binder, MD;  [facility];  Service: Endoscopy;  Laterality: N/A;  11:30am  . FLEXIBLE SIGMOIDOSCOPY N/A 12/10/2015   hemorrhoid banding X 3   . HEMORRHOID BANDING N/A 12/10/2015   Procedure: HEMORRHOID BANDING;  Surgeon: Danie Binder, MD;  [facility];  Service: Endoscopy;  Laterality: N/A;  1:30 PM  . INSERTION OF SUPRAPUBIC CATHETER N/A 02/24/2019   Procedure: INSERTION OF SUPRAPUBIC CATHETER;  Surgeon: Kathie Rhodes, MD;  [facility];  Service: Urology;  Laterality: N/A;  . IR CM INJ ANY COLONIC TUBE W/FLUORO  10/30/2018  . IR GASTROSTOMY TUBE MOD SED  10/04/2018  . IR IMAGING GUIDED PORT INSERTION  10/04/2018  . IR REMOVAL TUN ACCESS W/ PORT W/O FL MOD SED  10/14/2018  . IR REPLACE G-TUBE SIMPLE WO FLUORO  11/03/2018  . IR REPLACE G-TUBE SIMPLE WO FLUORO  07/28/2019  . LAPAROSCOPIC INSERTION GASTROSTOMY TUBE Left 10/07/2018   Procedure: LAPAROSCOPIC  GASTROSTOMY TUBE;  Surgeon: Kinsinger, Arta Bruce, MD;  Location: [place];  Service: General;  Laterality: Left;  Marland Kitchen MICROLARYNGOSCOPY N/A 09/27/2018   Procedure: MICRO DIRECT LARYNGOSCOPY WITH BIOPSY;  Surgeon: Benjamine Mola,  Atrial fibrillation ([place]) 10/03/2018  . Goals of care, counseling/discussion   . Dysphagia   . Loss of weight   . 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 Gastritis determined by endoscopy   . Esophageal dysphagia   . 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 Globus sensation 12/12/2016  . Smokes with greater than 40 pack year history 10/20/2016  . Hemorrhoids, internal, with bleeding 10/06/2015  . History of adenomatous polyp of colon 04/14/2015  . Vitamin D deficiency 10/09/2014  . HLD (hyperlipidemia) 10/08/2012  . GERD (gastroesophageal reflux disease) 10/08/2012  . HTN (hypertension)   . Diverticulitis 10/25/2009    2:26 PM, 08/28/19 Mearl Latin PT, DPT Physical Therapist at [facility] [place], [name], [address] Phone: [phone]   Fax:  [phone]  Name: Logan Price MRN: [id number] Date of Birth: [birth year]  [place], [name], [address] Phone: [phone]   Fax:  [phone]  Physical Therapy Treatment  Patient Details  Name: Logan Price MRN: [id number] Date of Birth: [birth year] Referring Provider (PT): Dr. Isidore Moos   Encounter Date: 08/28/2019  PT End of Session - 08/28/19 1352    Visit Number  9    Number of Visits  13    Date for PT Re-Evaluation  09/10/19    Progress Note Due on Visit  10    PT Start Time  1346    PT Stop Time  1425    PT Time Calculation (min)  39 min    Activity Tolerance  Patient tolerated treatment well;Patient limited by fatigue    Behavior During Therapy  [facility] for tasks assessed/performed       Past Medical History:  Diagnosis Date  . Diverticulitis   . Dysrhythmia 09/2018   episode of SVT while in hospital  . 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 GERD (gastroesophageal reflux disease)   . 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 HTN (hypertension)   . Hyperlipidemia     Past Surgical History:  Procedure Laterality Date  . BIOPSY  05/04/2015   Procedure: BIOPSY;  Surgeon: Danie Binder, MD;  [facility];  Service: Endoscopy;;  bx's of ileocecal valve   . BRONCHIAL BRUSHINGS  04/29/2019   Procedure: BRONCHIAL BRUSHINGS;  Surgeon: Garner Nash, DO;  Location: [place];  Service: Pulmonary;;  . BRONCHIAL NEEDLE ASPIRATION BIOPSY  04/29/2019   Procedure: BRONCHIAL NEEDLE ASPIRATION BIOPSIES;  Surgeon: Garner Nash, DO;  Location: [place];  Service: Pulmonary;;  . BRONCHIAL WASHINGS  04/29/2019   Procedure: BRONCHIAL WASHINGS;  Surgeon: Garner Nash, DO;  [facility];  Service: Pulmonary;;  . COLONOSCOPY WITH PROPOFOL N/A 05/04/2015   Dr. Oneida Alar: normal appearing ileum with prominent IC valve with tubular adenomas, moderate diverticulosis in sigmoid colon, ascending colon, and retum. Moderate sized internal hemorrhoids. Surveillance in 5  years  . ELECTROMAGNETIC NAVIGATION BROCHOSCOPY  04/29/2019   Procedure: NAVIGATION BRONCHOSCOPY;  Surgeon: Garner Nash, DO;  Location: [place] [facility];  Service: Pulmonary;;  . ESOPHAGOGASTRODUODENOSCOPY (EGD) WITH PROPOFOL N/A 12/19/2016   Procedure: ESOPHAGOGASTRODUODENOSCOPY (EGD) WITH PROPOFOL;  Surgeon: Danie Binder, MD;  [facility];  Service: Endoscopy;  Laterality: N/A;  11:30am  . FLEXIBLE SIGMOIDOSCOPY N/A 12/10/2015   hemorrhoid banding X 3   . HEMORRHOID BANDING N/A 12/10/2015   Procedure: HEMORRHOID BANDING;  Surgeon: Danie Binder, MD;  [facility];  Service: Endoscopy;  Laterality: N/A;  1:30 PM  . INSERTION OF SUPRAPUBIC CATHETER N/A 02/24/2019   Procedure: INSERTION OF SUPRAPUBIC CATHETER;  Surgeon: Kathie Rhodes, MD;  [facility];  Service: Urology;  Laterality: N/A;  . IR CM INJ ANY COLONIC TUBE W/FLUORO  10/30/2018  . IR GASTROSTOMY TUBE MOD SED  10/04/2018  . IR IMAGING GUIDED PORT INSERTION  10/04/2018  . IR REMOVAL TUN ACCESS W/ PORT W/O FL MOD SED  10/14/2018  . IR REPLACE G-TUBE SIMPLE WO FLUORO  11/03/2018  . IR REPLACE G-TUBE SIMPLE WO FLUORO  07/28/2019  . LAPAROSCOPIC INSERTION GASTROSTOMY TUBE Left 10/07/2018   Procedure: LAPAROSCOPIC  GASTROSTOMY TUBE;  Surgeon: Kinsinger, Arta Bruce, MD;  Location: [place];  Service: General;  Laterality: Left;  Marland Kitchen MICROLARYNGOSCOPY N/A 09/27/2018   Procedure: MICRO DIRECT LARYNGOSCOPY WITH BIOPSY;  Surgeon: Benjamine Mola,

## 2019-09-01 ENCOUNTER — Ambulatory Visit (HOSPITAL_COMMUNITY): Payer: Medicare Other | Admitting: Physical Therapy

## 2019-09-01 ENCOUNTER — Other Ambulatory Visit: Payer: Self-pay

## 2019-09-01 ENCOUNTER — Encounter (HOSPITAL_COMMUNITY): Payer: Self-pay | Admitting: Physical Therapy

## 2019-09-01 DIAGNOSIS — R2689 Other abnormalities of gait and mobility: Secondary | ICD-10-CM | POA: Diagnosis not present

## 2019-09-01 DIAGNOSIS — M6281 Muscle weakness (generalized): Secondary | ICD-10-CM

## 2019-09-01 NOTE — Therapy (Signed)
[place], [name], [address] Phone: [phone]   Fax:  [phone]  Physical Therapy Treatment/Progress note/Recert  Patient Details  Name: [place] MRN: [id number] Date of Birth: 09/14/1945 Referring Provider (PT): Dr. Isidore Moos   Encounter Date: 09/01/2019  Progress Note   Reporting Period 07/30/19 to 09/11/19   See note below for Objective Data and Assessment of Progress/Goals   PT End of Session - 09/01/19 1439    Visit Number  10    Number of Visits  21    Date for PT Re-Evaluation  09/29/19    Progress Note Due on Visit  10    PT Start Time  1433    PT Stop Time  1515    PT Time Calculation (min)  42 min    Activity Tolerance  Patient tolerated treatment well;Patient limited by fatigue    Behavior During Therapy  [facility] for tasks assessed/performed       Past Medical History:  Diagnosis Date  . Diverticulitis   . Dysrhythmia 09/2018   episode of SVT while in hospital  . 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 GERD (gastroesophageal reflux disease)   . 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 HTN (hypertension)   . Hyperlipidemia     Past Surgical History:  Procedure Laterality Date  . BIOPSY  05/04/2015   Procedure: BIOPSY;  Surgeon: Danie Binder, MD;  [facility];  Service: Endoscopy;;  bx's of ileocecal valve   . BRONCHIAL BRUSHINGS  04/29/2019   Procedure: BRONCHIAL BRUSHINGS;  Surgeon: Garner Nash, DO;  Location: [place];  Service: Pulmonary;;  . BRONCHIAL NEEDLE ASPIRATION BIOPSY  04/29/2019   Procedure: BRONCHIAL NEEDLE ASPIRATION BIOPSIES;  Surgeon: Garner Nash, DO;  Location: [place];  Service: Pulmonary;;  . BRONCHIAL WASHINGS  04/29/2019   Procedure: BRONCHIAL WASHINGS;  Surgeon: Garner Nash, DO;  [facility];  Service: Pulmonary;;  . COLONOSCOPY WITH PROPOFOL N/A 05/04/2015   Dr. Oneida Alar: normal appearing ileum with prominent IC  valve with tubular adenomas, moderate diverticulosis in sigmoid colon, ascending colon, and retum. Moderate sized internal hemorrhoids. Surveillance in 5 years  . ELECTROMAGNETIC NAVIGATION BROCHOSCOPY  04/29/2019   Procedure: NAVIGATION BRONCHOSCOPY;  Surgeon: Garner Nash, DO;  Location: [facility];  Service: Pulmonary;;  . ESOPHAGOGASTRODUODENOSCOPY (EGD) WITH PROPOFOL N/A 12/19/2016   Procedure: ESOPHAGOGASTRODUODENOSCOPY (EGD) WITH PROPOFOL;  Surgeon: Danie Binder, MD;  [facility];  Service: Endoscopy;  Laterality: N/A;  11:30am  . FLEXIBLE SIGMOIDOSCOPY N/A 12/10/2015   hemorrhoid banding X 3   . HEMORRHOID BANDING N/A 12/10/2015   Procedure: HEMORRHOID BANDING;  Surgeon: Danie Binder, MD;  [facility];  Service: Endoscopy;  Laterality: N/A;  1:30 PM  . INSERTION OF SUPRAPUBIC CATHETER N/A 02/24/2019   Procedure: INSERTION OF SUPRAPUBIC CATHETER;  Surgeon: Kathie Rhodes, MD;  [facility];  Service: Urology;  Laterality: N/A;  . IR CM INJ ANY COLONIC TUBE W/FLUORO  10/30/2018  . IR GASTROSTOMY TUBE MOD SED  10/04/2018  . IR IMAGING GUIDED PORT INSERTION  10/04/2018  . IR REMOVAL TUN ACCESS W/ PORT W/O FL MOD SED  10/14/2018  . IR REPLACE G-TUBE SIMPLE WO FLUORO  11/03/2018  . IR REPLACE G-TUBE SIMPLE WO FLUORO  07/28/2019  . LAPAROSCOPIC INSERTION GASTROSTOMY TUBE Left 10/07/2018   Procedure: LAPAROSCOPIC  GASTROSTOMY TUBE;  Surgeon: Kieth Brightly Arta Bruce, MD;  Location:  malnutrition, severe 10/08/2018  . PSVT (paroxysmal supraventricular tachycardia) ([place]) 10/03/2018  . Atrial fibrillation ([place]) 10/03/2018  . Goals of care, counseling/discussion   . Dysphagia   . Loss of weight   . 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 Gastritis determined by endoscopy   . Esophageal dysphagia   . 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 Globus sensation 12/12/2016  . Smokes with greater than 40 pack year history 10/20/2016  . Hemorrhoids, internal, with bleeding 10/06/2015  . History of adenomatous polyp of colon 04/14/2015  . Vitamin D deficiency 10/09/2014  . HLD (hyperlipidemia) 10/08/2012  . GERD (gastroesophageal reflux disease) 10/08/2012  . HTN (hypertension)   . Diverticulitis 10/25/2009    3:16 PM, 09/01/19 Mearl Latin PT, DPT Physical Therapist at [place], [name], [address] Phone: [phone]   Fax:  [phone]  Name: [place] MRN: [id number] Date of Birth: 07/12/1945  [place], [name], [address] Phone: [phone]   Fax:  [phone]  Physical Therapy Treatment/Progress note/Recert  Patient Details  Name: [place] MRN: [id number] Date of Birth: 09/14/1945 Referring Provider (PT): Dr. Isidore Moos   Encounter Date: 09/01/2019  Progress Note   Reporting Period 07/30/19 to 09/11/19   See note below for Objective Data and Assessment of Progress/Goals   PT End of Session - 09/01/19 1439    Visit Number  10    Number of Visits  21    Date for PT Re-Evaluation  09/29/19    Progress Note Due on Visit  10    PT Start Time  1433    PT Stop Time  1515    PT Time Calculation (min)  42 min    Activity Tolerance  Patient tolerated treatment well;Patient limited by fatigue    Behavior During Therapy  [facility] for tasks assessed/performed       Past Medical History:  Diagnosis Date  . Diverticulitis   . Dysrhythmia 09/2018   episode of SVT while in hospital  . 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 GERD (gastroesophageal reflux disease)   . 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 HTN (hypertension)   . Hyperlipidemia     Past Surgical History:  Procedure Laterality Date  . BIOPSY  05/04/2015   Procedure: BIOPSY;  Surgeon: Danie Binder, MD;  [facility];  Service: Endoscopy;;  bx's of ileocecal valve   . BRONCHIAL BRUSHINGS  04/29/2019   Procedure: BRONCHIAL BRUSHINGS;  Surgeon: Garner Nash, DO;  Location: [place];  Service: Pulmonary;;  . BRONCHIAL NEEDLE ASPIRATION BIOPSY  04/29/2019   Procedure: BRONCHIAL NEEDLE ASPIRATION BIOPSIES;  Surgeon: Garner Nash, DO;  Location: [place];  Service: Pulmonary;;  . BRONCHIAL WASHINGS  04/29/2019   Procedure: BRONCHIAL WASHINGS;  Surgeon: Garner Nash, DO;  [facility];  Service: Pulmonary;;  . COLONOSCOPY WITH PROPOFOL N/A 05/04/2015   Dr. Oneida Alar: normal appearing ileum with prominent IC  valve with tubular adenomas, moderate diverticulosis in sigmoid colon, ascending colon, and retum. Moderate sized internal hemorrhoids. Surveillance in 5 years  . ELECTROMAGNETIC NAVIGATION BROCHOSCOPY  04/29/2019   Procedure: NAVIGATION BRONCHOSCOPY;  Surgeon: Garner Nash, DO;  Location: [place] [facility];  Service: Pulmonary;;  . ESOPHAGOGASTRODUODENOSCOPY (EGD) WITH PROPOFOL N/A 12/19/2016   Procedure: ESOPHAGOGASTRODUODENOSCOPY (EGD) WITH PROPOFOL;  Surgeon: Danie Binder, MD;  [facility];  Service: Endoscopy;  Laterality: N/A;  11:30am  . FLEXIBLE SIGMOIDOSCOPY N/A 12/10/2015   hemorrhoid banding X 3   . HEMORRHOID BANDING N/A 12/10/2015   Procedure: HEMORRHOID BANDING;  Surgeon: Danie Binder, MD;  [facility];  Service: Endoscopy;  Laterality: N/A;  1:30 PM  . INSERTION OF SUPRAPUBIC CATHETER N/A 02/24/2019   Procedure: INSERTION OF SUPRAPUBIC CATHETER;  Surgeon: Kathie Rhodes, MD;  [facility];  Service: Urology;  Laterality: N/A;  . IR CM INJ ANY COLONIC TUBE W/FLUORO  10/30/2018  . IR GASTROSTOMY TUBE MOD SED  10/04/2018  . IR IMAGING GUIDED PORT INSERTION  10/04/2018  . IR REMOVAL TUN ACCESS W/ PORT W/O FL MOD SED  10/14/2018  . IR REPLACE G-TUBE SIMPLE WO FLUORO  11/03/2018  . IR REPLACE G-TUBE SIMPLE WO FLUORO  07/28/2019  . LAPAROSCOPIC INSERTION GASTROSTOMY TUBE Left 10/07/2018   Procedure: LAPAROSCOPIC  GASTROSTOMY TUBE;  Surgeon: Kieth Brightly Arta Bruce, MD;  Location:  [place], [name], [address] Phone: [phone]   Fax:  [phone]  Physical Therapy Treatment/Progress note/Recert  Patient Details  Name: [place] MRN: [id number] Date of Birth: 09/14/1945 Referring Provider (PT): Dr. Isidore Moos   Encounter Date: 09/01/2019  Progress Note   Reporting Period 07/30/19 to 09/11/19   See note below for Objective Data and Assessment of Progress/Goals   PT End of Session - 09/01/19 1439    Visit Number  10    Number of Visits  21    Date for PT Re-Evaluation  09/29/19    Progress Note Due on Visit  10    PT Start Time  1433    PT Stop Time  1515    PT Time Calculation (min)  42 min    Activity Tolerance  Patient tolerated treatment well;Patient limited by fatigue    Behavior During Therapy  [facility] for tasks assessed/performed       Past Medical History:  Diagnosis Date  . Diverticulitis   . Dysrhythmia 09/2018   episode of SVT while in hospital  . 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 GERD (gastroesophageal reflux disease)   . 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 HTN (hypertension)   . Hyperlipidemia     Past Surgical History:  Procedure Laterality Date  . BIOPSY  05/04/2015   Procedure: BIOPSY;  Surgeon: Danie Binder, MD;  [facility];  Service: Endoscopy;;  bx's of ileocecal valve   . BRONCHIAL BRUSHINGS  04/29/2019   Procedure: BRONCHIAL BRUSHINGS;  Surgeon: Garner Nash, DO;  Location: [place];  Service: Pulmonary;;  . BRONCHIAL NEEDLE ASPIRATION BIOPSY  04/29/2019   Procedure: BRONCHIAL NEEDLE ASPIRATION BIOPSIES;  Surgeon: Garner Nash, DO;  Location: [place];  Service: Pulmonary;;  . BRONCHIAL WASHINGS  04/29/2019   Procedure: BRONCHIAL WASHINGS;  Surgeon: Garner Nash, DO;  [facility];  Service: Pulmonary;;  . COLONOSCOPY WITH PROPOFOL N/A 05/04/2015   Dr. Oneida Alar: normal appearing ileum with prominent IC  valve with tubular adenomas, moderate diverticulosis in sigmoid colon, ascending colon, and retum. Moderate sized internal hemorrhoids. Surveillance in 5 years  . ELECTROMAGNETIC NAVIGATION BROCHOSCOPY  04/29/2019   Procedure: NAVIGATION BRONCHOSCOPY;  Surgeon: Garner Nash, DO;  Location: [place] [facility];  Service: Pulmonary;;  . ESOPHAGOGASTRODUODENOSCOPY (EGD) WITH PROPOFOL N/A 12/19/2016   Procedure: ESOPHAGOGASTRODUODENOSCOPY (EGD) WITH PROPOFOL;  Surgeon: Danie Binder, MD;  [facility];  Service: Endoscopy;  Laterality: N/A;  11:30am  . FLEXIBLE SIGMOIDOSCOPY N/A 12/10/2015   hemorrhoid banding X 3   . HEMORRHOID BANDING N/A 12/10/2015   Procedure: HEMORRHOID BANDING;  Surgeon: Danie Binder, MD;  [facility];  Service: Endoscopy;  Laterality: N/A;  1:30 PM  . INSERTION OF SUPRAPUBIC CATHETER N/A 02/24/2019   Procedure: INSERTION OF SUPRAPUBIC CATHETER;  Surgeon: Kathie Rhodes, MD;  [facility];  Service: Urology;  Laterality: N/A;  . IR CM INJ ANY COLONIC TUBE W/FLUORO  10/30/2018  . IR GASTROSTOMY TUBE MOD SED  10/04/2018  . IR IMAGING GUIDED PORT INSERTION  10/04/2018  . IR REMOVAL TUN ACCESS W/ PORT W/O FL MOD SED  10/14/2018  . IR REPLACE G-TUBE SIMPLE WO FLUORO  11/03/2018  . IR REPLACE G-TUBE SIMPLE WO FLUORO  07/28/2019  . LAPAROSCOPIC INSERTION GASTROSTOMY TUBE Left 10/07/2018   Procedure: LAPAROSCOPIC  GASTROSTOMY TUBE;  Surgeon: Kieth Brightly Arta Bruce, MD;  Location:  malnutrition, severe 10/08/2018  . PSVT (paroxysmal supraventricular tachycardia) ([place]) 10/03/2018  . Atrial fibrillation ([place]) 10/03/2018  . Goals of care, counseling/discussion   . Dysphagia   . Loss of weight   . 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 Gastritis determined by endoscopy   . Esophageal dysphagia   . 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 Globus sensation 12/12/2016  . Smokes with greater than 40 pack year history 10/20/2016  . Hemorrhoids, internal, with bleeding 10/06/2015  . History of adenomatous polyp of colon 04/14/2015  . Vitamin D deficiency 10/09/2014  . HLD (hyperlipidemia) 10/08/2012  . GERD (gastroesophageal reflux disease) 10/08/2012  . HTN (hypertension)   . Diverticulitis 10/25/2009    3:16 PM, 09/01/19 Mearl Latin PT, DPT Physical Therapist at [place], [name], [address] Phone: [phone]   Fax:  [phone]  Name: Logan [place] MRN: [id number] Date of Birth: 07/12/1945

## 2019-09-03 ENCOUNTER — Encounter (HOSPITAL_COMMUNITY): Payer: PRIVATE HEALTH INSURANCE | Admitting: Physical Therapy

## 2019-09-04 ENCOUNTER — Ambulatory Visit (HOSPITAL_COMMUNITY): Payer: Medicare Other | Admitting: Physical Therapy

## 2019-09-04 ENCOUNTER — Other Ambulatory Visit: Payer: Self-pay

## 2019-09-04 DIAGNOSIS — R2689 Other abnormalities of gait and mobility: Secondary | ICD-10-CM

## 2019-09-04 DIAGNOSIS — M6281 Muscle weakness (generalized): Secondary | ICD-10-CM

## 2019-09-04 NOTE — Therapy (Signed)
[place], [name], [address] Phone: [phone]   Fax:  [phone]  Physical Therapy Treatment  Patient Details  Name: Logan Price MRN: [id number] Date of Birth: March 22, 1946 Referring Provider (PT): Dr. Isidore Moos   Encounter Date: 09/04/2019   PT End of Session - 09/04/19 1533    Visit Number 11    Number of Visits 21    Date for PT Re-Evaluation 09/29/19    Progress Note Due on Visit 20    PT Start Time 1450    PT Stop Time 1530    PT Time Calculation (min) 40 min    Activity Tolerance Patient tolerated treatment well;Patient limited by fatigue    Behavior During Therapy [facility] for tasks assessed/performed           Past Medical History:  Diagnosis Date  . Diverticulitis   . Dysrhythmia 09/2018   episode of SVT while in hospital  . 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 GERD (gastroesophageal reflux disease)   . 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 HTN (hypertension)   . Hyperlipidemia     Past Surgical History:  Procedure Laterality Date  . BIOPSY  05/04/2015   Procedure: BIOPSY;  Surgeon: Danie Binder, MD;  [facility];  Service: Endoscopy;;  bx's of ileocecal valve   . BRONCHIAL BRUSHINGS  04/29/2019   Procedure: BRONCHIAL BRUSHINGS;  Surgeon: Garner Nash, DO;  Location: [place];  Service: Pulmonary;;  . BRONCHIAL NEEDLE ASPIRATION BIOPSY  04/29/2019   Procedure: BRONCHIAL NEEDLE ASPIRATION BIOPSIES;  Surgeon: Garner Nash, DO;  Location: [place];  Service: Pulmonary;;  . BRONCHIAL WASHINGS  04/29/2019   Procedure: BRONCHIAL WASHINGS;  Surgeon: Garner Nash, DO;  [facility];  Service: Pulmonary;;  . COLONOSCOPY WITH PROPOFOL N/A 05/04/2015   Dr. Oneida Alar: normal appearing ileum with prominent IC valve with tubular adenomas, moderate diverticulosis in sigmoid colon, ascending colon, and retum. Moderate sized internal hemorrhoids. Surveillance in 5  years  . ELECTROMAGNETIC NAVIGATION BROCHOSCOPY  04/29/2019   Procedure: NAVIGATION BRONCHOSCOPY;  Surgeon: Garner Nash, DO;  Location: [facility];  Service: Pulmonary;;  . ESOPHAGOGASTRODUODENOSCOPY (EGD) WITH PROPOFOL N/A 12/19/2016   Procedure: ESOPHAGOGASTRODUODENOSCOPY (EGD) WITH PROPOFOL;  Surgeon: Danie Binder, MD;  [facility];  Service: Endoscopy;  Laterality: N/A;  11:30am  . FLEXIBLE SIGMOIDOSCOPY N/A 12/10/2015   hemorrhoid banding X 3   . HEMORRHOID BANDING N/A 12/10/2015   Procedure: HEMORRHOID BANDING;  Surgeon: Danie Binder, MD;  [facility];  Service: Endoscopy;  Laterality: N/A;  1:30 PM  . INSERTION OF SUPRAPUBIC CATHETER N/A 02/24/2019   Procedure: INSERTION OF SUPRAPUBIC CATHETER;  Surgeon: Kathie Rhodes, MD;  [facility];  Service: Urology;  Laterality: N/A;  . IR CM INJ ANY COLONIC TUBE W/FLUORO  10/30/2018  . IR GASTROSTOMY TUBE MOD SED  10/04/2018  . IR IMAGING GUIDED PORT INSERTION  10/04/2018  . IR REMOVAL TUN ACCESS W/ PORT W/O FL MOD SED  10/14/2018  . IR REPLACE G-TUBE SIMPLE WO FLUORO  11/03/2018  . IR REPLACE G-TUBE SIMPLE WO FLUORO  07/28/2019  . LAPAROSCOPIC INSERTION GASTROSTOMY TUBE Left 10/07/2018   Procedure: LAPAROSCOPIC  GASTROSTOMY TUBE;  Surgeon: Kinsinger, Arta Bruce, MD;  Location: [facility];  Service: General;  Laterality: Left;  [place] MICROLARYNGOSCOPY N/A 09/27/2018   Procedure: MICRO DIRECT LARYNGOSCOPY WITH BIOPSY;  Surgeon: Leta Baptist, MD;  Location:  [place], [name], [address] Phone: [phone]   Fax:  [phone]  Physical Therapy Treatment  Patient Details  Name: Logan [place] MRN: [id number] Date of Birth: March 22, 1946 Referring Provider (PT): Dr. Isidore Moos   Encounter Date: 09/04/2019   PT End of Session - 09/04/19 1533    Visit Number 11    Number of Visits 21    Date for PT Re-Evaluation 09/29/19    Progress Note Due on Visit 20    PT Start Time 1450    PT Stop Time 1530    PT Time Calculation (min) 40 min    Activity Tolerance Patient tolerated treatment well;Patient limited by fatigue    Behavior During Therapy [facility] for tasks assessed/performed           Past Medical History:  Diagnosis Date  . Diverticulitis   . Dysrhythmia 09/2018   episode of SVT while in hospital  . 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 GERD (gastroesophageal reflux disease)   . 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 HTN (hypertension)   . Hyperlipidemia     Past Surgical History:  Procedure Laterality Date  . BIOPSY  05/04/2015   Procedure: BIOPSY;  Surgeon: Danie Binder, MD;  [facility];  Service: Endoscopy;;  bx's of ileocecal valve   . BRONCHIAL BRUSHINGS  04/29/2019   Procedure: BRONCHIAL BRUSHINGS;  Surgeon: Garner Nash, DO;  Location: [place];  Service: Pulmonary;;  . BRONCHIAL NEEDLE ASPIRATION BIOPSY  04/29/2019   Procedure: BRONCHIAL NEEDLE ASPIRATION BIOPSIES;  Surgeon: Garner Nash, DO;  Location: [place];  Service: Pulmonary;;  . BRONCHIAL WASHINGS  04/29/2019   Procedure: BRONCHIAL WASHINGS;  Surgeon: Garner Nash, DO;  [facility];  Service: Pulmonary;;  . COLONOSCOPY WITH PROPOFOL N/A 05/04/2015   Dr. Oneida Alar: normal appearing ileum with prominent IC valve with tubular adenomas, moderate diverticulosis in sigmoid colon, ascending colon, and retum. Moderate sized internal hemorrhoids. Surveillance in 5  years  . ELECTROMAGNETIC NAVIGATION BROCHOSCOPY  04/29/2019   Procedure: NAVIGATION BRONCHOSCOPY;  Surgeon: Garner Nash, DO;  Location: [place] [facility];  Service: Pulmonary;;  . ESOPHAGOGASTRODUODENOSCOPY (EGD) WITH PROPOFOL N/A 12/19/2016   Procedure: ESOPHAGOGASTRODUODENOSCOPY (EGD) WITH PROPOFOL;  Surgeon: Danie Binder, MD;  [facility];  Service: Endoscopy;  Laterality: N/A;  11:30am  . FLEXIBLE SIGMOIDOSCOPY N/A 12/10/2015   hemorrhoid banding X 3   . HEMORRHOID BANDING N/A 12/10/2015   Procedure: HEMORRHOID BANDING;  Surgeon: Danie Binder, MD;  [facility];  Service: Endoscopy;  Laterality: N/A;  1:30 PM  . INSERTION OF SUPRAPUBIC CATHETER N/A 02/24/2019   Procedure: INSERTION OF SUPRAPUBIC CATHETER;  Surgeon: Kathie Rhodes, MD;  [facility];  Service: Urology;  Laterality: N/A;  . IR CM INJ ANY COLONIC TUBE W/FLUORO  10/30/2018  . IR GASTROSTOMY TUBE MOD SED  10/04/2018  . IR IMAGING GUIDED PORT INSERTION  10/04/2018  . IR REMOVAL TUN ACCESS W/ PORT W/O FL MOD SED  10/14/2018  . IR REPLACE G-TUBE SIMPLE WO FLUORO  11/03/2018  . IR REPLACE G-TUBE SIMPLE WO FLUORO  07/28/2019  . LAPAROSCOPIC INSERTION GASTROSTOMY TUBE Left 10/07/2018   Procedure: LAPAROSCOPIC  GASTROSTOMY TUBE;  Surgeon: Kinsinger, Arta Bruce, MD;  Location: [address];  Service: General;  Laterality: Left;  [place] Kitchen MICROLARYNGOSCOPY N/A 09/27/2018   Procedure: MICRO DIRECT LARYNGOSCOPY WITH BIOPSY;  Surgeon: Leta Baptist, MD;  Location:  Fax:  [phone]  Name: RAYMAR JOINER MRN: [id number] Date of Birth: Jul 30, 1945  [place], [name], [address] Phone: [phone]   Fax:  [phone]  Physical Therapy Treatment  Patient Details  Name: [place] MRN: [id number] Date of Birth: March 22, 1946 Referring Provider (PT): Dr. Isidore Moos   Encounter Date: 09/04/2019   PT End of Session - 09/04/19 1533    Visit Number 11    Number of Visits 21    Date for PT Re-Evaluation 09/29/19    Progress Note Due on Visit 20    PT Start Time 1450    PT Stop Time 1530    PT Time Calculation (min) 40 min    Activity Tolerance Patient tolerated treatment well;Patient limited by fatigue    Behavior During Therapy [facility] for tasks assessed/performed           Past Medical History:  Diagnosis Date  . Diverticulitis   . Dysrhythmia 09/2018   episode of SVT while in hospital  . 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 GERD (gastroesophageal reflux disease)   . 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 HTN (hypertension)   . Hyperlipidemia     Past Surgical History:  Procedure Laterality Date  . BIOPSY  05/04/2015   Procedure: BIOPSY;  Surgeon: Danie Binder, MD;  [facility];  Service: Endoscopy;;  bx's of ileocecal valve   . BRONCHIAL BRUSHINGS  04/29/2019   Procedure: BRONCHIAL BRUSHINGS;  Surgeon: Garner Nash, DO;  Location: [place];  Service: Pulmonary;;  . BRONCHIAL NEEDLE ASPIRATION BIOPSY  04/29/2019   Procedure: BRONCHIAL NEEDLE ASPIRATION BIOPSIES;  Surgeon: Garner Nash, DO;  Location: [place];  Service: Pulmonary;;  . BRONCHIAL WASHINGS  04/29/2019   Procedure: BRONCHIAL WASHINGS;  Surgeon: Garner Nash, DO;  [facility];  Service: Pulmonary;;  . COLONOSCOPY WITH PROPOFOL N/A 05/04/2015   Dr. Oneida Alar: normal appearing ileum with prominent IC valve with tubular adenomas, moderate diverticulosis in sigmoid colon, ascending colon, and retum. Moderate sized internal hemorrhoids. Surveillance in 5  years  . ELECTROMAGNETIC NAVIGATION BROCHOSCOPY  04/29/2019   Procedure: NAVIGATION BRONCHOSCOPY;  Surgeon: Garner Nash, DO;  Location: [facility] [facility];  Service: Pulmonary;;  . ESOPHAGOGASTRODUODENOSCOPY (EGD) WITH PROPOFOL N/A 12/19/2016   Procedure: ESOPHAGOGASTRODUODENOSCOPY (EGD) WITH PROPOFOL;  Surgeon: Danie Binder, MD;  [facility];  Service: Endoscopy;  Laterality: N/A;  11:30am  . FLEXIBLE SIGMOIDOSCOPY N/A 12/10/2015   hemorrhoid banding X 3   . HEMORRHOID BANDING N/A 12/10/2015   Procedure: HEMORRHOID BANDING;  Surgeon: Danie Binder, MD;  [facility];  Service: Endoscopy;  Laterality: N/A;  1:30 PM  . INSERTION OF SUPRAPUBIC CATHETER N/A 02/24/2019   Procedure: INSERTION OF SUPRAPUBIC CATHETER;  Surgeon: Kathie Rhodes, MD;  [facility];  Service: Urology;  Laterality: N/A;  . IR CM INJ ANY COLONIC TUBE W/FLUORO  10/30/2018  . IR GASTROSTOMY TUBE MOD SED  10/04/2018  . IR IMAGING GUIDED PORT INSERTION  10/04/2018  . IR REMOVAL TUN ACCESS W/ PORT W/O FL MOD SED  10/14/2018  . IR REPLACE G-TUBE SIMPLE WO FLUORO  11/03/2018  . IR REPLACE G-TUBE SIMPLE WO FLUORO  07/28/2019  . LAPAROSCOPIC INSERTION GASTROSTOMY TUBE Left 10/07/2018   Procedure: LAPAROSCOPIC  GASTROSTOMY TUBE;  Surgeon: Kinsinger, Arta Bruce, MD;  Location: [address];  Service: General;  Laterality: Left;  [place] Kitchen MICROLARYNGOSCOPY N/A 09/27/2018   Procedure: MICRO DIRECT LARYNGOSCOPY WITH BIOPSY;  Surgeon: Leta Baptist, MD;  Location:

## 2019-09-08 ENCOUNTER — Ambulatory Visit (INDEPENDENT_AMBULATORY_CARE_PROVIDER_SITE_OTHER): Payer: Medicare Other | Admitting: Licensed Clinical Social Worker

## 2019-09-08 DIAGNOSIS — I10 Essential (primary) hypertension: Secondary | ICD-10-CM

## 2019-09-08 DIAGNOSIS — E785 Hyperlipidemia, unspecified: Secondary | ICD-10-CM

## 2019-09-08 DIAGNOSIS — K219 Gastro-esophageal reflux disease without esophagitis: Secondary | ICD-10-CM

## 2019-09-08 DIAGNOSIS — E559 Vitamin D deficiency, unspecified: Secondary | ICD-10-CM

## 2019-09-08 NOTE — Patient Instructions (Signed)
Licensed Clinical Social Worker Visit Information  Goals we discussed today:  Goals Addressed              This Visit's Progress   .  Client will talk with LCSW in next 30 days to discuss ADLs completion for client (pt-stated)        CARE PLAN ENTRY   Current Barriers:  . 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 Talked with client about pain issues in his lower back and legs . Talked with client about mobility issues . Talked with client about food intake of client (has been using PEG tube as needed for nutrition) . Talked with client about transport needs of client . Talked with client about social support network (has support from his spouse and from his brother) . Talked with client about ADLs completion of client . Talked with client about upcoming medical appointments  . Talked with client about physical therapy sessions received by client . Talked with [facility] about his skin condition at present . Encouraged Joziah to talk with RNCM as needed regarding nursing needs of client . Talked with Kyheem about his history of treatments for cancer . Talked with Cabe about ambulation needs (he uses walker as needed for ambulation assistance)  Patient Self Care Activities:   Attends scheduled medical appointments Drives to medical appointments and drives to complete errands needed  Patient Self Care Deficits:  Mobility issues Pain issues   Initial goal documentation        Materials Provided:No  Follow Up Plan: LCSW to call client in next 4 weeks to talk with client about his completion of  daily ADLs  The patient verbalized understanding of instructions provided today and declined a print copy of patient instruction materials.   Norva Riffle.Sabah Zucco MSW,  LCSW Licensed Clinical Social Worker [facility] [phone]

## 2019-09-08 NOTE — Chronic Care Management (AMB) (Addendum)
Chronic Care Management    Clinical Social Work Follow Up Note  09/08/2019 Name: Logan Price MRN: [id number] DOB: 23-Sep-1945  Logan Price is a 74 y.o. year old male who is a primary care patient of [name], FNP. The CCM team was consulted for assistance with [name] .   Review of patient status, including review of consultants reports, other relevant assessments, and collaboration with appropriate care team members and the patient's provider was performed as part of comprehensive patient evaluation and provision of chronic care management services.    SDOH (Social Determinants of Health) assessments performed: Yes; risk for tobacco use; risk for depression  SDOH Interventions      Most Recent Value  SDOH Interventions  Depression Interventions/Treatment  --  [talked with client about RNCM support and about LCSW support]         Chronic Care Management from 09/08/2019 in [facility]  PHQ-9 Total Score 2      GAD 7 : Generalized Anxiety Score 09/08/2019  Nervous, Anxious, on Edge 0  Control/stop worrying 0  Worry too much - different things 0  Trouble relaxing 0  Restless 0  Easily annoyed or irritable 0  Afraid - awful might happen 0  Total GAD 7 Score 0  Anxiety Difficulty Somewhat difficult    Outpatient Encounter Medications as of 09/08/2019  Medication Sig   albuterol (PROVENTIL) (2.5 MG/3ML) 0.083% nebulizer solution every 6 hours as directed by provider   Amino Acids-Protein Hydrolys (FEEDING SUPPLEMENT, PRO-STAT SUGAR FREE 64,) LIQD Place 30 mLs into feeding tube 2 (two) times daily.   doxazosin (CARDURA) 1 MG tablet Place 1 tablet (1 mg total) into feeding tube daily. (Patient taking differently: Place 1 mg into feeding tube daily at 12 noon. )   Nutritional Supplements (KATE FARMS PEPTIDE 1.5) LIQD 325 mLs by PEG Tube route continuous. (Patient taking differently: 325 mLs by PEG Tube route 4 (four) times daily. )    pantoprazole (PROTONIX) 40 MG tablet 1 po 30 mins prior to first meal (Patient taking differently: Take 40 mg by mouth daily. )   simvastatin (ZOCOR) 20 MG tablet Take 1 tablet (20 mg total) by mouth daily at 6 PM.   Water For Irrigation, Sterile (FREE WATER) SOLN Place 200 mLs into feeding tube every 4 (four) hours.   No facility-administered encounter medications on file as of 09/08/2019.    Goals Addressed               This Visit's Progress     Client will talk with LCSW in next 30 days to discuss ADLs completion for client (pt-stated)        CARE PLAN ENTRY   Current Barriers:  Atrial Fibrillation in client with Chronic Diagnoses of Adenocarcinoma of left lung, Vitamin D deficiency, GERD, HTN, HLD  Clinical Social Work Clinical Goal(s):  LCSW will call client in next 30 days to discuss client completion of ADLs  Interventions:   Talked with client about pain issues in his lower back and legs Talked with client about mobility issues Talked with client about food intake of client (has been using PEG tube as needed for nutrition) Talked with client about transport needs of client Talked with client about social support network (has support from his spouse and from his brother) Talked with client about ADLs completion of client Talked with client about upcoming medical appointments  Talked with client about physical therapy sessions received by client Talked with  Logan Price about his skin condition at present Encouraged Logan Price to talk with RNCM as needed regarding nursing needs of client Talked with Logan Price about his history of treatments for cancer Talked with Logan Price about ambulation needs (he uses walker as needed for ambulation assistance)  Patient Self Care Activities:   Attends scheduled medical appointments Drives to medical appointments and drives to complete errands needed  Patient Self Care Deficits:  Mobility issues Pain issues   Initial goal documentation           Follow Up Plan: LCSW to call client in next 4 weeks to talk with client about client completion of daily ADLs  Logan Price.Naidelin Gugliotta MSW, LCSW Licensed Clinical Social Worker [facility] [phone]  I have reviewed and agree with the above documentation.   [name], FNP

## 2019-09-09 ENCOUNTER — Other Ambulatory Visit: Payer: Self-pay

## 2019-09-09 ENCOUNTER — Ambulatory Visit (INDEPENDENT_AMBULATORY_CARE_PROVIDER_SITE_OTHER): Payer: Medicare Other | Admitting: Internal Medicine

## 2019-09-09 ENCOUNTER — Encounter (INDEPENDENT_AMBULATORY_CARE_PROVIDER_SITE_OTHER): Payer: Self-pay | Admitting: Internal Medicine

## 2019-09-09 VITALS — BP 123/76 | HR 76 | Temp 98.2°F | Ht 69.0 in | Wt 153.0 lb

## 2019-09-09 DIAGNOSIS — K219 Gastro-esophageal reflux disease without esophagitis: Secondary | ICD-10-CM

## 2019-09-09 DIAGNOSIS — Z86010 Personal history of colonic polyps: Secondary | ICD-10-CM

## 2019-09-09 DIAGNOSIS — R1319 Other dysphagia: Secondary | ICD-10-CM

## 2019-09-09 DIAGNOSIS — R1310 Dysphagia, unspecified: Secondary | ICD-10-CM

## 2019-09-09 NOTE — Patient Instructions (Signed)
Please call office if experience swallowing difficulty

## 2019-09-09 NOTE — Progress Notes (Signed)
Presenting complaint;  Follow-up for dysphagia and GERD.  Database and subjective:  Patient is 74 year old African-American who is here for yearly visit accompanied by his wife. He has a history of Schatzki's ring and underwent esophageal dilation by Dr. Oneida Alar in September 2018.  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  He also has a history of squamous cell carcinoma of lungs and has received radiation therapy. He underwent laparoscopic placement of gastrostomy tube in July last year since attempts by IR fails.  This tube has been changed few times. Patient says he is doing well.  He is on regular diet and not having any difficulty.  He has been evaluated by speech pathologist.  He is hoping that gastrostomy tube would be removed soon.  He denies abdominal pain melena or rectal bleeding.  His bowels move every day. Patient has gained 4 pounds since his last visit. Patient's wife says that he is receiving physical therapy but he still needing walker to ambulate.  Current Medications: Outpatient Encounter Medications as of 09/09/2019  Medication Sig  . albuterol (PROVENTIL) (2.5 MG/3ML) 0.083% nebulizer solution every 6 hours as directed by provider  . doxazosin (CARDURA) 1 MG tablet Place 1 tablet (1 mg total) into feeding tube daily. (Patient taking differently: Take 1 mg by mouth daily. )  . pantoprazole (PROTONIX) 40 MG tablet 1 po 30 mins prior to first meal (Patient taking differently: Take 40 mg by mouth daily. )  . simvastatin (ZOCOR) 20 MG tablet Take 1 tablet (20 mg total) by mouth daily at 6 PM.  . Water For Irrigation, Sterile (FREE WATER) SOLN Place 200 mLs into feeding tube every 4 (four) hours.  . Amino Acids-Protein Hydrolys (FEEDING SUPPLEMENT, PRO-STAT SUGAR FREE 64,) LIQD Place 30 mLs into feeding tube 2 (two) times daily. (Patient not taking: Reported on 09/09/2019)  . [DISCONTINUED] Nutritional Supplements (KATE FARMS PEPTIDE 1.5) LIQD 325 mLs by  PEG Tube route continuous. (Patient not taking: Reported on 09/09/2019)   No facility-administered encounter medications on file as of 09/09/2019.     Objective: Blood pressure 123/76, pulse 76, temperature 98.2 F (36.8 C), temperature source Oral, height 5\' 9"  (1.753 m), weight 153 lb (69.4 kg). Patient is alert and in no acute distress. He is wearing a mask. Conjunctiva is pink. Sclera is nonicteric Oropharyngeal mucosa is normal. No neck masses or thyromegaly noted. Cardiac exam with regular rhythm normal S1 and S2. No murmur or gallop noted. Lungs are clear to auscultation. Abdomen is symmetrical.  He has laparoscopy scars across upper abdomen and he has 14 [place] gastrostomy tube in place.  There is no tenderness or drainage around the stoma site.  Abdomen is soft and nontender with organomegaly or masses. No LE edema or clubbing noted. He has patchy skin pigmentation neck upper back as well as chest and forearms(sequelae to radiation therapy)   Assessment:  #1.  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  He then had laparoscopic placement of gastrostomy tube.   He also has history of Schatzki's ring which was last dilated in October 2018.  He has completed chemoradiation and is scheduled to undergo PET scan. Patient presently is not having any difficulty with regular food.  Gastrostomy tube will be removed when recommended by his oncologist.  #3.  GERD.  Heartburn is well controlled with therapy.  #4.  History of colonic adenomas.  He had  2 colonic adenomas removed by Dr. Oneida Alar in May 17, 2015 and will not be due for surveillance colonoscopy until February next year.   Plan:  Continue pantoprazole at current dose of 40 mg by mouth 30 minutes before breakfast daily. Patient will call should he develop swallowing difficulty. Office visit in 1 year at  which time we will schedule him for surveillance colonoscopy.

## 2019-09-10 ENCOUNTER — Ambulatory Visit (HOSPITAL_COMMUNITY): Payer: Medicare Other | Admitting: Physical Therapy

## 2019-09-10 DIAGNOSIS — R2689 Other abnormalities of gait and mobility: Secondary | ICD-10-CM | POA: Diagnosis not present

## 2019-09-10 DIAGNOSIS — M6281 Muscle weakness (generalized): Secondary | ICD-10-CM

## 2019-09-10 NOTE — Therapy (Signed)
[address] [place], [name], [address] Phone: [phone]   Fax:  [phone]  Name: Logan Price MRN: [id number] Date of Birth: [birth year]  [place], [name], [address] Phone: [phone]   Fax:  [phone]  Physical Therapy Treatment  Patient Details  Name: Logan Price MRN: [id number] Date of Birth: 1945-04-11 Referring Provider (PT): Dr. Isidore Moos   Encounter Date: 09/10/2019   PT End of Session - 09/10/19 1716    Visit Number 12    Number of Visits 21    Date for PT Re-Evaluation 09/29/19    Progress Note Due on Visit 20    PT Start Time 1448    PT Stop Time 1528    PT Time Calculation (min) 40 min    Activity Tolerance Patient tolerated treatment well;Patient limited by fatigue    Behavior During Therapy [facility] for tasks assessed/performed           Past Medical History:  Diagnosis Date  . Diverticulitis   . Dysrhythmia 09/2018   episode of SVT while in hospital  . 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 GERD (gastroesophageal reflux disease)   . 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 HTN (hypertension)   . Hyperlipidemia     Past Surgical History:  Procedure Laterality Date  . BIOPSY  05/04/2015   Procedure: BIOPSY;  Surgeon: Danie Binder, MD;  [facility];  Service: Endoscopy;;  bx's of ileocecal valve   . BRONCHIAL BRUSHINGS  04/29/2019   Procedure: BRONCHIAL BRUSHINGS;  Surgeon: Garner Nash, DO;  Location: [place];  Service: Pulmonary;;  . BRONCHIAL NEEDLE ASPIRATION BIOPSY  04/29/2019   Procedure: BRONCHIAL NEEDLE ASPIRATION BIOPSIES;  Surgeon: Garner Nash, DO;  Location: [place];  Service: Pulmonary;;  . BRONCHIAL WASHINGS  04/29/2019   Procedure: BRONCHIAL WASHINGS;  Surgeon: Garner Nash, DO;  [facility];  Service: Pulmonary;;  . COLONOSCOPY WITH PROPOFOL N/A 05/04/2015   Dr. Oneida Alar: normal appearing ileum with prominent IC valve with tubular adenomas, moderate diverticulosis in sigmoid colon, ascending colon, and retum. Moderate sized internal hemorrhoids. Surveillance in 5  years  . ELECTROMAGNETIC NAVIGATION BROCHOSCOPY  04/29/2019   Procedure: NAVIGATION BRONCHOSCOPY;  Surgeon: Garner Nash, DO;  Location: [facility];  Service: Pulmonary;;  . ESOPHAGOGASTRODUODENOSCOPY (EGD) WITH PROPOFOL N/A 12/19/2016   Procedure: ESOPHAGOGASTRODUODENOSCOPY (EGD) WITH PROPOFOL;  Surgeon: Danie Binder, MD;  [facility];  Service: Endoscopy;  Laterality: N/A;  11:30am  . FLEXIBLE SIGMOIDOSCOPY N/A 12/10/2015   hemorrhoid banding X 3   . HEMORRHOID BANDING N/A 12/10/2015   Procedure: HEMORRHOID BANDING;  Surgeon: Danie Binder, MD;  [facility];  Service: Endoscopy;  Laterality: N/A;  1:30 PM  . INSERTION OF SUPRAPUBIC CATHETER N/A 02/24/2019   Procedure: INSERTION OF SUPRAPUBIC CATHETER;  Surgeon: Kathie Rhodes, MD;  [facility];  Service: Urology;  Laterality: N/A;  . IR CM INJ ANY COLONIC TUBE W/FLUORO  10/30/2018  . IR GASTROSTOMY TUBE MOD SED  10/04/2018  . IR IMAGING GUIDED PORT INSERTION  10/04/2018  . IR REMOVAL TUN ACCESS W/ PORT W/O FL MOD SED  10/14/2018  . IR REPLACE G-TUBE SIMPLE WO FLUORO  11/03/2018  . IR REPLACE G-TUBE SIMPLE WO FLUORO  07/28/2019  . LAPAROSCOPIC INSERTION GASTROSTOMY TUBE Left 10/07/2018   Procedure: LAPAROSCOPIC  GASTROSTOMY TUBE;  Surgeon: Kinsinger, Arta Bruce, MD;  Location: [place];  Service: General;  Laterality: Left;  Marland Kitchen MICROLARYNGOSCOPY N/A 09/27/2018   Procedure: MICRO DIRECT LARYNGOSCOPY WITH BIOPSY;  Surgeon: Leta Baptist, MD;  Location:  [address] [place], [name], [address] Phone: [phone]   Fax:  [phone]  Name: Logan Price MRN: [id number] Date of Birth: [birth year]  [place], [name], [address] Phone: [phone]   Fax:  [phone]  Physical Therapy Treatment  Patient Details  Name: Logan Price MRN: [id number] Date of Birth: 1945-04-11 Referring Provider (PT): Dr. Isidore Moos   Encounter Date: 09/10/2019   PT End of Session - 09/10/19 1716    Visit Number 12    Number of Visits 21    Date for PT Re-Evaluation 09/29/19    Progress Note Due on Visit 20    PT Start Time 1448    PT Stop Time 1528    PT Time Calculation (min) 40 min    Activity Tolerance Patient tolerated treatment well;Patient limited by fatigue    Behavior During Therapy [facility] for tasks assessed/performed           Past Medical History:  Diagnosis Date  . Diverticulitis   . Dysrhythmia 09/2018   episode of SVT while in hospital  . 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 GERD (gastroesophageal reflux disease)   . 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 HTN (hypertension)   . Hyperlipidemia     Past Surgical History:  Procedure Laterality Date  . BIOPSY  05/04/2015   Procedure: BIOPSY;  Surgeon: Danie Binder, MD;  [facility];  Service: Endoscopy;;  bx's of ileocecal valve   . BRONCHIAL BRUSHINGS  04/29/2019   Procedure: BRONCHIAL BRUSHINGS;  Surgeon: Garner Nash, DO;  Location: [place];  Service: Pulmonary;;  . BRONCHIAL NEEDLE ASPIRATION BIOPSY  04/29/2019   Procedure: BRONCHIAL NEEDLE ASPIRATION BIOPSIES;  Surgeon: Garner Nash, DO;  Location: [place];  Service: Pulmonary;;  . BRONCHIAL WASHINGS  04/29/2019   Procedure: BRONCHIAL WASHINGS;  Surgeon: Garner Nash, DO;  [facility];  Service: Pulmonary;;  . COLONOSCOPY WITH PROPOFOL N/A 05/04/2015   Dr. Oneida Alar: normal appearing ileum with prominent IC valve with tubular adenomas, moderate diverticulosis in sigmoid colon, ascending colon, and retum. Moderate sized internal hemorrhoids. Surveillance in 5  years  . ELECTROMAGNETIC NAVIGATION BROCHOSCOPY  04/29/2019   Procedure: NAVIGATION BRONCHOSCOPY;  Surgeon: Garner Nash, DO;  Location: [facility];  Service: Pulmonary;;  . ESOPHAGOGASTRODUODENOSCOPY (EGD) WITH PROPOFOL N/A 12/19/2016   Procedure: ESOPHAGOGASTRODUODENOSCOPY (EGD) WITH PROPOFOL;  Surgeon: Danie Binder, MD;  [facility];  Service: Endoscopy;  Laterality: N/A;  11:30am  . FLEXIBLE SIGMOIDOSCOPY N/A 12/10/2015   hemorrhoid banding X 3   . HEMORRHOID BANDING N/A 12/10/2015   Procedure: HEMORRHOID BANDING;  Surgeon: Danie Binder, MD;  [facility];  Service: Endoscopy;  Laterality: N/A;  1:30 PM  . INSERTION OF SUPRAPUBIC CATHETER N/A 02/24/2019   Procedure: INSERTION OF SUPRAPUBIC CATHETER;  Surgeon: Kathie Rhodes, MD;  [facility];  Service: Urology;  Laterality: N/A;  . IR CM INJ ANY COLONIC TUBE W/FLUORO  10/30/2018  . IR GASTROSTOMY TUBE MOD SED  10/04/2018  . IR IMAGING GUIDED PORT INSERTION  10/04/2018  . IR REMOVAL TUN ACCESS W/ PORT W/O FL MOD SED  10/14/2018  . IR REPLACE G-TUBE SIMPLE WO FLUORO  11/03/2018  . IR REPLACE G-TUBE SIMPLE WO FLUORO  07/28/2019  . LAPAROSCOPIC INSERTION GASTROSTOMY TUBE Left 10/07/2018   Procedure: LAPAROSCOPIC  GASTROSTOMY TUBE;  Surgeon: Kinsinger, Arta Bruce, MD;  Location: [place];  Service: General;  Laterality: Left;  Marland Kitchen MICROLARYNGOSCOPY N/A 09/27/2018   Procedure: MICRO DIRECT LARYNGOSCOPY WITH BIOPSY;  Surgeon: Leta Baptist, MD;  Location:

## 2019-09-12 ENCOUNTER — Ambulatory Visit (HOSPITAL_COMMUNITY): Payer: Medicare Other | Admitting: Physical Therapy

## 2019-09-12 ENCOUNTER — Other Ambulatory Visit: Payer: Self-pay

## 2019-09-12 ENCOUNTER — Telehealth: Payer: Self-pay | Admitting: *Deleted

## 2019-09-12 DIAGNOSIS — M6281 Muscle weakness (generalized): Secondary | ICD-10-CM

## 2019-09-12 DIAGNOSIS — R2689 Other abnormalities of gait and mobility: Secondary | ICD-10-CM

## 2019-09-12 NOTE — Telephone Encounter (Signed)
CALLED PATIENT TO INFORM THAT HE NEEDS TO COME IN ON 09-18-19 @ 10:30 AM FOR STAT LABS PRIOR TO SCAN ON 09-18-19, LVM FOR A RETURN CALL

## 2019-09-12 NOTE — Therapy (Signed)
Pt returns today walking with cane.  Continued with focus on dynamic balance activities.  Progressed to balance beam this session with overall good stability.  Continues to progress well with less need for rest breaks and LOB.  Requires cues to complete tasks more slowly and controlled.  Tends to speed up with walking requiring cues to slow down and take larger steps.  Pt will continue to benefit from balance and strengthening.    Personal Factors and Comorbidities Age;Fitness;Comorbidity 2    Comorbidities radiation history, unknown cancer status    Examination-Activity Limitations Carry    Examination-Participation Restrictions Yard Work;Community Activity    Stability/Clinical Decision Making Stable/Uncomplicated    Rehab Potential Good    PT Frequency 2x / week    PT Duration 6 weeks    PT Treatment/Interventions ADLs/Self Care Home Management;Gait training;Therapeutic exercise;Therapeutic activities;Neuromuscular re-education;Patient/family education;Manual techniques    PT Next Visit Plan Continue to progress static balance on compliant surface and dynamic balance activity/ gait.    PT Home Exercise Plan Access Code: [email or web address] given medbridge handout on seated hamstring stretch, supine LTR, and TKE/quad sets in supine; 5/13: scapular retraction bridging, decompression ex and wall arch    Consulted and Agree with Plan of Care Patient           Patient will benefit from skilled therapeutic intervention in order to improve the following deficits and impairments:  Pain, Decreased activity tolerance, Difficulty walking, Decreased balance  Visit Diagnosis: Other abnormalities of gait and mobility  Muscle weakness  (generalized)     Problem List Patient Active Problem List   Diagnosis Date Noted  . Malignant neoplasm of right upper lobe of lung ([place]) 05/27/2019  . Primary cancer of left upper lobe of lung ([place]) 05/27/2019  . Pseudomonas aeruginosa infection 05/21/2019  . Tracheobronchitis 05/21/2019  . Hx of radiation therapy 05/21/2019  . 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[place]) 05/21/2019  . Squamous cell lung cancer, right ([place]) 05/21/2019  . Adenocarcinoma, lung, left ([place]) 05/21/2019  . Lung nodule 04/23/2019  . Ground glass opacity present on imaging of lung 04/23/2019  . Head and neck cancer ([place]) 04/23/2019  . Tracheostomy tube present ([place]) 04/23/2019  . PEG (percutaneous endoscopic gastrostomy) adjustment/replacement/removal ([place]) 03/08/2019  . 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 Pleural effusion   . 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[place])   . Bacteremia   . Leukocytosis   . Normocytic anemia   . Protein-calorie malnutrition, severe 10/08/2018  . PSVT (paroxysmal supraventricular tachycardia) ([place]) 10/03/2018  . Atrial fibrillation ([place]) 10/03/2018  . Goals of care, counseling/discussion   . Dysphagia   . Loss of weight   . 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 Gastritis determined by endoscopy   . Esophageal dysphagia   . 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 Globus sensation 12/12/2016  . Smokes with greater than 40 pack year history 10/20/2016  . Hemorrhoids, internal, with bleeding 10/06/2015  . History of colonic polyps 04/14/2015  . Vitamin D deficiency 10/09/2014  . HLD (hyperlipidemia) 10/08/2012  . GERD (gastroesophageal reflux disease) 10/08/2012  . HTN (hypertension)   . Diverticulitis 10/25/2009   Teena Irani, PTA/CLT [phone]  Teena Irani 09/12/2019, 11:14 AM  [place], [name], [address] Phone: [phone]   Fax:  [phone]  Name: MARTIE FULGHAM MRN: [id number] Date of Birth: 14-Feb-1946  [place], [name], [address] Phone: [phone]   Fax:  [phone]  Physical Therapy Treatment  Patient Details  Name: Logan Price MRN: [id number] Date of Birth: 10-Dec-1945 Referring Provider (PT): Dr. Isidore Moos   Encounter Date: 09/12/2019   PT End of Session - 09/12/19 1110    Visit Number 13    Number of Visits 21    Date for PT Re-Evaluation 09/29/19    Progress Note Due on Visit 20    PT Start Time 1008    PT Stop Time 1048    PT Time Calculation (min) 40 min    Activity Tolerance Patient tolerated treatment well;Patient limited by fatigue    Behavior During Therapy [facility] for tasks assessed/performed           Past Medical History:  Diagnosis Date  . Diverticulitis   . Dysrhythmia 09/2018   episode of SVT while in hospital  . 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 GERD (gastroesophageal reflux disease)   . 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 HTN (hypertension)   . Hyperlipidemia     Past Surgical History:  Procedure Laterality Date  . BIOPSY  05/04/2015   Procedure: BIOPSY;  Surgeon: Danie Binder, MD;  [facility];  Service: Endoscopy;;  bx's of ileocecal valve   . BRONCHIAL BRUSHINGS  04/29/2019   Procedure: BRONCHIAL BRUSHINGS;  Surgeon: Garner Nash, DO;  Location: [place];  Service: Pulmonary;;  . BRONCHIAL NEEDLE ASPIRATION BIOPSY  04/29/2019   Procedure: BRONCHIAL NEEDLE ASPIRATION BIOPSIES;  Surgeon: Garner Nash, DO;  Location: [place];  Service: Pulmonary;;  . BRONCHIAL WASHINGS  04/29/2019   Procedure: BRONCHIAL WASHINGS;  Surgeon: Garner Nash, DO;  [facility];  Service: Pulmonary;;  . COLONOSCOPY WITH PROPOFOL N/A 05/04/2015   Dr. Oneida Alar: normal appearing ileum with prominent IC valve with tubular adenomas, moderate diverticulosis in sigmoid colon, ascending colon, and retum. Moderate sized internal hemorrhoids. Surveillance in 5  years  . ELECTROMAGNETIC NAVIGATION BROCHOSCOPY  04/29/2019   Procedure: NAVIGATION BRONCHOSCOPY;  Surgeon: Garner Nash, DO;  Location: [place] [facility];  Service: Pulmonary;;  . ESOPHAGOGASTRODUODENOSCOPY (EGD) WITH PROPOFOL N/A 12/19/2016   Procedure: ESOPHAGOGASTRODUODENOSCOPY (EGD) WITH PROPOFOL;  Surgeon: Danie Binder, MD;  [facility];  Service: Endoscopy;  Laterality: N/A;  11:30am  . FLEXIBLE SIGMOIDOSCOPY N/A 12/10/2015   hemorrhoid banding X 3   . HEMORRHOID BANDING N/A 12/10/2015   Procedure: HEMORRHOID BANDING;  Surgeon: Danie Binder, MD;  [facility];  Service: Endoscopy;  Laterality: N/A;  1:30 PM  . INSERTION OF SUPRAPUBIC CATHETER N/A 02/24/2019   Procedure: INSERTION OF SUPRAPUBIC CATHETER;  Surgeon: Kathie Rhodes, MD;  [facility];  Service: Urology;  Laterality: N/A;  . IR CM INJ ANY COLONIC TUBE W/FLUORO  10/30/2018  . IR GASTROSTOMY TUBE MOD SED  10/04/2018  . IR IMAGING GUIDED PORT INSERTION  10/04/2018  . IR REMOVAL TUN ACCESS W/ PORT W/O FL MOD SED  10/14/2018  . IR REPLACE G-TUBE SIMPLE WO FLUORO  11/03/2018  . IR REPLACE G-TUBE SIMPLE WO FLUORO  07/28/2019  . LAPAROSCOPIC INSERTION GASTROSTOMY TUBE Left 10/07/2018   Procedure: LAPAROSCOPIC  GASTROSTOMY TUBE;  Surgeon: Kinsinger, Arta Bruce, MD;  Location: [place];  Service: General;  Laterality: Left;  Marland Kitchen MICROLARYNGOSCOPY N/A 09/27/2018   Procedure: MICRO DIRECT LARYNGOSCOPY WITH BIOPSY;  Surgeon: Leta Baptist, MD;  Location:  Pt returns today walking with cane.  Continued with focus on dynamic balance activities.  Progressed to balance beam this session with overall good stability.  Continues to progress well with less need for rest breaks and LOB.  Requires cues to complete tasks more slowly and controlled.  Tends to speed up with walking requiring cues to slow down and take larger steps.  Pt will continue to benefit from balance and strengthening.    Personal Factors and Comorbidities Age;Fitness;Comorbidity 2    Comorbidities radiation history, unknown cancer status    Examination-Activity Limitations Carry    Examination-Participation Restrictions Yard Work;Community Activity    Stability/Clinical Decision Making Stable/Uncomplicated    Rehab Potential Good    PT Frequency 2x / week    PT Duration 6 weeks    PT Treatment/Interventions ADLs/Self Care Home Management;Gait training;Therapeutic exercise;Therapeutic activities;Neuromuscular re-education;Patient/family education;Manual techniques    PT Next Visit Plan Continue to progress static balance on compliant surface and dynamic balance activity/ gait.    PT Home Exercise Plan Access Code: [email or web address] given medbridge handout on seated hamstring stretch, supine LTR, and TKE/quad sets in supine; 5/13: scapular retraction bridging, decompression ex and wall arch    Consulted and Agree with Plan of Care Patient           Patient will benefit from skilled therapeutic intervention in order to improve the following deficits and impairments:  Pain, Decreased activity tolerance, Difficulty walking, Decreased balance  Visit Diagnosis: Other abnormalities of gait and mobility  Muscle weakness  (generalized)     Problem List Patient Active Problem List   Diagnosis Date Noted  . Malignant neoplasm of right upper lobe of lung ([place]) 05/27/2019  . Primary cancer of left upper lobe of lung ([place]) 05/27/2019  . Pseudomonas aeruginosa infection 05/21/2019  . Tracheobronchitis 05/21/2019  . Hx of radiation therapy 05/21/2019  . 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[place]) 05/21/2019  . Squamous cell lung cancer, right ([place]) 05/21/2019  . Adenocarcinoma, lung, left ([place]) 05/21/2019  . Lung nodule 04/23/2019  . Ground glass opacity present on imaging of lung 04/23/2019  . Head and neck cancer ([place]) 04/23/2019  . Tracheostomy tube present ([place]) 04/23/2019  . PEG (percutaneous endoscopic gastrostomy) adjustment/replacement/removal ([place]) 03/08/2019  . 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 Pleural effusion   . 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[place])   . Bacteremia   . Leukocytosis   . Normocytic anemia   . Protein-calorie malnutrition, severe 10/08/2018  . PSVT (paroxysmal supraventricular tachycardia) ([place]) 10/03/2018  . Atrial fibrillation ([place]) 10/03/2018  . Goals of care, counseling/discussion   . Dysphagia   . Loss of weight   . 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 Gastritis determined by endoscopy   . Esophageal dysphagia   . 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 Globus sensation 12/12/2016  . Smokes with greater than 40 pack year history 10/20/2016  . Hemorrhoids, internal, with bleeding 10/06/2015  . History of colonic polyps 04/14/2015  . Vitamin D deficiency 10/09/2014  . HLD (hyperlipidemia) 10/08/2012  . GERD (gastroesophageal reflux disease) 10/08/2012  . HTN (hypertension)   . Diverticulitis 10/25/2009   Teena Irani, PTA/CLT [phone]  Teena Irani 09/12/2019, 11:14 AM  [place], [name], [address] Phone: [phone]   Fax:  [phone]  Name: MARTIE FULGHAM MRN: [id number] Date of Birth: 14-Feb-1946

## 2019-09-15 ENCOUNTER — Ambulatory Visit (HOSPITAL_COMMUNITY): Payer: Medicare Other | Admitting: Physical Therapy

## 2019-09-15 ENCOUNTER — Other Ambulatory Visit: Payer: Self-pay

## 2019-09-15 ENCOUNTER — Encounter (HOSPITAL_COMMUNITY): Payer: Self-pay | Admitting: Physical Therapy

## 2019-09-15 DIAGNOSIS — R2689 Other abnormalities of gait and mobility: Secondary | ICD-10-CM

## 2019-09-15 DIAGNOSIS — M6281 Muscle weakness (generalized): Secondary | ICD-10-CM

## 2019-09-15 NOTE — Therapy (Signed)
colonic polyps 04/14/2015  . Vitamin D deficiency 10/09/2014  . HLD (hyperlipidemia) 10/08/2012  . GERD (gastroesophageal reflux disease) 10/08/2012  . HTN (hypertension)   . Diverticulitis 10/25/2009    2:31 PM, 09/15/19 Mearl Latin PT, DPT Physical Therapist at [place], [name], [address] Phone: [phone]   Fax:  [phone]  Name: Logan Price MRN: [id number] Date of Birth: [birth year]  colonic polyps 04/14/2015  . Vitamin D deficiency 10/09/2014  . HLD (hyperlipidemia) 10/08/2012  . GERD (gastroesophageal reflux disease) 10/08/2012  . HTN (hypertension)   . Diverticulitis 10/25/2009    2:31 PM, 09/15/19 Mearl Latin PT, DPT Physical Therapist at [place], [name], [address] Phone: [phone]   Fax:  [phone]  Name: Logan Price MRN: [id number] Date of Birth: [birth year]  [place], [name], [address] Phone: [phone]   Fax:  [phone]  Physical Therapy Treatment  Patient Details  Name: Logan Price MRN: [id number] Date of Birth: 19-Jan-1946 Referring Provider (PT): Dr. Isidore Moos   Encounter Date: 09/15/2019   PT End of Session - 09/15/19 1352    Visit Number 14    Number of Visits 21    Date for PT Re-Evaluation 09/29/19    Progress Note Due on Visit 20    PT Start Time 7893    PT Stop Time 1430    PT Time Calculation (min) 41 min    Activity Tolerance Patient tolerated treatment well;Patient limited by fatigue    Behavior During Therapy [facility] for tasks assessed/performed           Past Medical History:  Diagnosis Date  . Diverticulitis   . Dysrhythmia 09/2018   episode of SVT while in hospital  . 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 GERD (gastroesophageal reflux disease)   . 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 HTN (hypertension)   . Hyperlipidemia     Past Surgical History:  Procedure Laterality Date  . BIOPSY  05/04/2015   Procedure: BIOPSY;  Surgeon: Danie Binder, MD;  [facility];  Service: Endoscopy;;  bx's of ileocecal valve   . BRONCHIAL BRUSHINGS  04/29/2019   Procedure: BRONCHIAL BRUSHINGS;  Surgeon: Garner Nash, DO;  Location: [place];  Service: Pulmonary;;  . BRONCHIAL NEEDLE ASPIRATION BIOPSY  04/29/2019   Procedure: BRONCHIAL NEEDLE ASPIRATION BIOPSIES;  Surgeon: Garner Nash, DO;  Location: [place];  Service: Pulmonary;;  . BRONCHIAL WASHINGS  04/29/2019   Procedure: BRONCHIAL WASHINGS;  Surgeon: Garner Nash, DO;  [facility];  Service: Pulmonary;;  . COLONOSCOPY WITH PROPOFOL N/A 05/04/2015   Dr. Oneida Alar: normal appearing ileum with prominent IC valve with tubular adenomas, moderate diverticulosis in sigmoid colon, ascending colon, and retum. Moderate sized internal hemorrhoids. Surveillance in 5  years  . ELECTROMAGNETIC NAVIGATION BROCHOSCOPY  04/29/2019   Procedure: NAVIGATION BRONCHOSCOPY;  Surgeon: Garner Nash, DO;  Location: [facility];  Service: Pulmonary;;  . ESOPHAGOGASTRODUODENOSCOPY (EGD) WITH PROPOFOL N/A 12/19/2016   Procedure: ESOPHAGOGASTRODUODENOSCOPY (EGD) WITH PROPOFOL;  Surgeon: Danie Binder, MD;  [facility];  Service: Endoscopy;  Laterality: N/A;  11:30am  . FLEXIBLE SIGMOIDOSCOPY N/A 12/10/2015   hemorrhoid banding X 3   . HEMORRHOID BANDING N/A 12/10/2015   Procedure: HEMORRHOID BANDING;  Surgeon: Danie Binder, MD;  [facility];  Service: Endoscopy;  Laterality: N/A;  1:30 PM  . INSERTION OF SUPRAPUBIC CATHETER N/A 02/24/2019   Procedure: INSERTION OF SUPRAPUBIC CATHETER;  Surgeon: Kathie Rhodes, MD;  [facility];  Service: Urology;  Laterality: N/A;  . IR CM INJ ANY COLONIC TUBE W/FLUORO  10/30/2018  . IR GASTROSTOMY TUBE MOD SED  10/04/2018  . IR IMAGING GUIDED PORT INSERTION  10/04/2018  . IR REMOVAL TUN ACCESS W/ PORT W/O FL MOD SED  10/14/2018  . IR REPLACE G-TUBE SIMPLE WO FLUORO  11/03/2018  . IR REPLACE G-TUBE SIMPLE WO FLUORO  07/28/2019  . LAPAROSCOPIC INSERTION GASTROSTOMY TUBE Left 10/07/2018   Procedure: LAPAROSCOPIC  GASTROSTOMY TUBE;  Surgeon: Kinsinger, Arta Bruce, MD;  Location: [place];  Service: General;  Laterality: Left;  Marland Kitchen MICROLARYNGOSCOPY N/A 09/27/2018   Procedure: MICRO DIRECT LARYNGOSCOPY WITH BIOPSY;  Surgeon: Leta Baptist, MD;  Location:  colonic polyps 04/14/2015  . Vitamin D deficiency 10/09/2014  . HLD (hyperlipidemia) 10/08/2012  . GERD (gastroesophageal reflux disease) 10/08/2012  . HTN (hypertension)   . Diverticulitis 10/25/2009    2:31 PM, 09/15/19 Mearl Latin PT, DPT Physical Therapist at [place], [name], [address] Phone: [phone]   Fax:  [phone]  Name: Logan Price MRN: [id number] Date of Birth: [birth year]

## 2019-09-16 ENCOUNTER — Ambulatory Visit (HOSPITAL_COMMUNITY): Payer: Medicare Other

## 2019-09-16 ENCOUNTER — Encounter (HOSPITAL_COMMUNITY): Payer: Self-pay

## 2019-09-16 DIAGNOSIS — R2689 Other abnormalities of gait and mobility: Secondary | ICD-10-CM | POA: Diagnosis not present

## 2019-09-16 DIAGNOSIS — M6281 Muscle weakness (generalized): Secondary | ICD-10-CM

## 2019-09-16 NOTE — Therapy (Signed)
[place]) 04/23/2019  . PEG (percutaneous endoscopic gastrostomy)  adjustment/replacement/removal ([place]) 03/08/2019  . 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 Pleural effusion   . 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[place])   . Bacteremia   . Leukocytosis   . Normocytic anemia   . Protein-calorie malnutrition, severe 10/08/2018  . PSVT (paroxysmal supraventricular tachycardia) ([place]) 10/03/2018  . Atrial fibrillation ([place]) 10/03/2018  . Goals of care, counseling/discussion   . Dysphagia   . Loss of weight   . 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 Gastritis determined by endoscopy   . Esophageal dysphagia   . 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 Globus sensation 12/12/2016  . Smokes with greater than 40 pack year history 10/20/2016  . Hemorrhoids, internal, with bleeding 10/06/2015  . History of colonic polyps 04/14/2015  . Vitamin D deficiency 10/09/2014  . HLD (hyperlipidemia) 10/08/2012  . GERD (gastroesophageal reflux disease) 10/08/2012  . HTN (hypertension)   . Diverticulitis 10/25/2009   Logan Price, LPTA/CLT; CBIS [phone]  Logan Price 09/16/2019, 2:37 PM  [facility] [address] [place], [name], [address] Phone: [phone]   Fax:  [phone]  Name: Logan Price MRN: [id number] Date of Birth: [birth year]  [place]) 04/23/2019  . PEG (percutaneous endoscopic gastrostomy)  adjustment/replacement/removal ([place]) 03/08/2019  . 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 Pleural effusion   . 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[place])   . Bacteremia   . Leukocytosis   . Normocytic anemia   . Protein-calorie malnutrition, severe 10/08/2018  . PSVT (paroxysmal supraventricular tachycardia) ([place]) 10/03/2018  . Atrial fibrillation ([place]) 10/03/2018  . Goals of care, counseling/discussion   . Dysphagia   . Loss of weight   . 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 Gastritis determined by endoscopy   . Esophageal dysphagia   . 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 Globus sensation 12/12/2016  . Smokes with greater than 40 pack year history 10/20/2016  . Hemorrhoids, internal, with bleeding 10/06/2015  . History of colonic polyps 04/14/2015  . Vitamin D deficiency 10/09/2014  . HLD (hyperlipidemia) 10/08/2012  . GERD (gastroesophageal reflux disease) 10/08/2012  . HTN (hypertension)   . Diverticulitis 10/25/2009   Logan Price, LPTA/CLT; CBIS [phone]  Logan Price 09/16/2019, 2:37 PM  [facility] [address] [place], [name], [address] Phone: [phone]   Fax:  [phone]  Name: Logan Price MRN: [id number] Date of Birth: [birth year]  [place], [name], [address] Phone: [phone]   Fax:  [phone]  Physical Therapy Treatment  Patient Details  Name: Logan Price MRN: [id number] Date of Birth: 1945-03-28 Referring Provider (PT): Dr. Isidore Moos   Encounter Date: 09/16/2019   PT End of Session - 09/16/19 1349    Visit Number 15    Number of Visits 21    Date for PT Re-Evaluation 09/29/19    Progress Note Due on Visit 20    PT Start Time 1336    PT Stop Time 1422    PT Time Calculation (min) 46 min    Equipment Utilized During Treatment Gait belt    Activity Tolerance Patient tolerated treatment well;Patient limited by fatigue    Behavior During Therapy [facility] for tasks assessed/performed           Past Medical History:  Diagnosis Date  . Diverticulitis   . Dysrhythmia 09/2018   episode of SVT while in hospital  . 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 GERD (gastroesophageal reflux disease)   . 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 HTN (hypertension)   . Hyperlipidemia     Past Surgical History:  Procedure Laterality Date  . BIOPSY  05/04/2015   Procedure: BIOPSY;  Surgeon: Danie Binder, MD;  [facility];  Service: Endoscopy;;  bx's of ileocecal valve   . BRONCHIAL BRUSHINGS  04/29/2019   Procedure: BRONCHIAL BRUSHINGS;  Surgeon: Garner Nash, DO;  Location: [place];  Service: Pulmonary;;  . BRONCHIAL NEEDLE ASPIRATION BIOPSY  04/29/2019   Procedure: BRONCHIAL NEEDLE ASPIRATION BIOPSIES;  Surgeon: Garner Nash, DO;  Location: [place];  Service: Pulmonary;;  . BRONCHIAL WASHINGS  04/29/2019   Procedure: BRONCHIAL WASHINGS;  Surgeon: Garner Nash, DO;  [facility];  Service: Pulmonary;;  . COLONOSCOPY WITH PROPOFOL N/A 05/04/2015   Dr. Oneida Alar: normal appearing ileum with prominent IC valve with tubular adenomas, moderate diverticulosis in sigmoid colon, ascending colon, and retum.  Moderate sized internal hemorrhoids. Surveillance in 5 years  . ELECTROMAGNETIC NAVIGATION BROCHOSCOPY  04/29/2019   Procedure: NAVIGATION BRONCHOSCOPY;  Surgeon: Garner Nash, DO;  Location: [facility];  Service: Pulmonary;;  . ESOPHAGOGASTRODUODENOSCOPY (EGD) WITH PROPOFOL N/A 12/19/2016   Procedure: ESOPHAGOGASTRODUODENOSCOPY (EGD) WITH PROPOFOL;  Surgeon: Danie Binder, MD;  [facility];  Service: Endoscopy;  Laterality: N/A;  11:30am  . FLEXIBLE SIGMOIDOSCOPY N/A 12/10/2015   hemorrhoid banding X 3   . HEMORRHOID BANDING N/A 12/10/2015   Procedure: HEMORRHOID BANDING;  Surgeon: Danie Binder, MD;  [facility];  Service: Endoscopy;  Laterality: N/A;  1:30 PM  . INSERTION OF SUPRAPUBIC CATHETER N/A 02/24/2019   Procedure: INSERTION OF SUPRAPUBIC CATHETER;  Surgeon: Kathie Rhodes, MD;  [facility];  Service: Urology;  Laterality: N/A;  . IR CM INJ ANY COLONIC TUBE W/FLUORO  10/30/2018  . IR GASTROSTOMY TUBE MOD SED  10/04/2018  . IR IMAGING GUIDED PORT INSERTION  10/04/2018  . IR REMOVAL TUN ACCESS W/ PORT W/O FL MOD SED  10/14/2018  . IR REPLACE G-TUBE SIMPLE WO FLUORO  11/03/2018  . IR REPLACE G-TUBE SIMPLE WO FLUORO  07/28/2019  . LAPAROSCOPIC INSERTION GASTROSTOMY TUBE Left 10/07/2018   Procedure: LAPAROSCOPIC  GASTROSTOMY TUBE;  Surgeon: Kinsinger, Arta Bruce, MD;  Location: [place];  Service: General;  Laterality: Left;  Marland Kitchen MICROLARYNGOSCOPY N/A 09/27/2018   Procedure: MICRO DIRECT LARYNGOSCOPY  [place], [name], [address] Phone: [phone]   Fax:  [phone]  Physical Therapy Treatment  Patient Details  Name: Logan Price MRN: [id number] Date of Birth: 1945-03-28 Referring Provider (PT): Dr. Isidore Moos   Encounter Date: 09/16/2019   PT End of Session - 09/16/19 1349    Visit Number 15    Number of Visits 21    Date for PT Re-Evaluation 09/29/19    Progress Note Due on Visit 20    PT Start Time 1336    PT Stop Time 1422    PT Time Calculation (min) 46 min    Equipment Utilized During Treatment Gait belt    Activity Tolerance Patient tolerated treatment well;Patient limited by fatigue    Behavior During Therapy [facility] for tasks assessed/performed           Past Medical History:  Diagnosis Date  . Diverticulitis   . Dysrhythmia 09/2018   episode of SVT while in hospital  . 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 GERD (gastroesophageal reflux disease)   . 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 HTN (hypertension)   . Hyperlipidemia     Past Surgical History:  Procedure Laterality Date  . BIOPSY  05/04/2015   Procedure: BIOPSY;  Surgeon: Danie Binder, MD;  [facility];  Service: Endoscopy;;  bx's of ileocecal valve   . BRONCHIAL BRUSHINGS  04/29/2019   Procedure: BRONCHIAL BRUSHINGS;  Surgeon: Garner Nash, DO;  Location: [place];  Service: Pulmonary;;  . BRONCHIAL NEEDLE ASPIRATION BIOPSY  04/29/2019   Procedure: BRONCHIAL NEEDLE ASPIRATION BIOPSIES;  Surgeon: Garner Nash, DO;  Location: [place];  Service: Pulmonary;;  . BRONCHIAL WASHINGS  04/29/2019   Procedure: BRONCHIAL WASHINGS;  Surgeon: Garner Nash, DO;  [facility];  Service: Pulmonary;;  . COLONOSCOPY WITH PROPOFOL N/A 05/04/2015   Dr. Oneida Alar: normal appearing ileum with prominent IC valve with tubular adenomas, moderate diverticulosis in sigmoid colon, ascending colon, and retum.  Moderate sized internal hemorrhoids. Surveillance in 5 years  . ELECTROMAGNETIC NAVIGATION BROCHOSCOPY  04/29/2019   Procedure: NAVIGATION BRONCHOSCOPY;  Surgeon: Garner Nash, DO;  Location: [facility];  Service: Pulmonary;;  . ESOPHAGOGASTRODUODENOSCOPY (EGD) WITH PROPOFOL N/A 12/19/2016   Procedure: ESOPHAGOGASTRODUODENOSCOPY (EGD) WITH PROPOFOL;  Surgeon: Danie Binder, MD;  [facility];  Service: Endoscopy;  Laterality: N/A;  11:30am  . FLEXIBLE SIGMOIDOSCOPY N/A 12/10/2015   hemorrhoid banding X 3   . HEMORRHOID BANDING N/A 12/10/2015   Procedure: HEMORRHOID BANDING;  Surgeon: Danie Binder, MD;  [facility];  Service: Endoscopy;  Laterality: N/A;  1:30 PM  . INSERTION OF SUPRAPUBIC CATHETER N/A 02/24/2019   Procedure: INSERTION OF SUPRAPUBIC CATHETER;  Surgeon: Kathie Rhodes, MD;  [facility];  Service: Urology;  Laterality: N/A;  . IR CM INJ ANY COLONIC TUBE W/FLUORO  10/30/2018  . IR GASTROSTOMY TUBE MOD SED  10/04/2018  . IR IMAGING GUIDED PORT INSERTION  10/04/2018  . IR REMOVAL TUN ACCESS W/ PORT W/O FL MOD SED  10/14/2018  . IR REPLACE G-TUBE SIMPLE WO FLUORO  11/03/2018  . IR REPLACE G-TUBE SIMPLE WO FLUORO  07/28/2019  . LAPAROSCOPIC INSERTION GASTROSTOMY TUBE Left 10/07/2018   Procedure: LAPAROSCOPIC  GASTROSTOMY TUBE;  Surgeon: Kinsinger, Arta Bruce, MD;  Location: [facility];  Service: General;  Laterality: Left;  Marland Kitchen MICROLARYNGOSCOPY N/A 09/27/2018   Procedure: MICRO DIRECT LARYNGOSCOPY

## 2019-09-18 ENCOUNTER — Ambulatory Visit (HOSPITAL_COMMUNITY)
Admission: RE | Admit: 2019-09-18 | Discharge: 2019-09-18 | Disposition: A | Discharge status: Home / Self Care | Payer: Medicare Other | Source: Ambulatory Visit | Attending: Radiation Oncology | Admitting: Radiation Oncology

## 2019-09-18 ENCOUNTER — Ambulatory Visit
Admission: RE | Admit: 2019-09-18 | Discharge: 2019-09-18 | Disposition: A | Discharge status: Home / Self Care | Payer: Medicare Other | Source: Ambulatory Visit | Attending: Radiation Oncology | Admitting: Radiation Oncology

## 2019-09-18 ENCOUNTER — Encounter (HOSPITAL_COMMUNITY): Payer: Self-pay

## 2019-09-18 ENCOUNTER — Other Ambulatory Visit: Payer: Self-pay

## 2019-09-18 ENCOUNTER — Other Ambulatory Visit: Payer: Self-pay | Admitting: *Deleted

## 2019-09-18 DIAGNOSIS — C320 Malignant neoplasm of glottis: Secondary | ICD-10-CM

## 2019-09-18 DIAGNOSIS — C3412 Malignant neoplasm of upper lobe, left bronchus or lung: Secondary | ICD-10-CM

## 2019-09-18 DIAGNOSIS — C3411 Malignant neoplasm of upper lobe, right bronchus or lung: Secondary | ICD-10-CM | POA: Diagnosis present

## 2019-09-18 LAB — CMP (CANCER CENTER ONLY)
ALT: 13 U/L (ref 0–44)
AST: 18 U/L (ref 15–41)
Albumin: 3.8 g/dL (ref 3.5–5.0)
Alkaline Phosphatase: 58 U/L (ref 38–126)
Anion gap: 8 (ref 5–15)
BUN: 13 mg/dL (ref 8–23)
CO2: 30 mmol/L (ref 22–32)
Calcium: 9.2 mg/dL (ref 8.9–10.3)
Chloride: 105 mmol/L (ref 98–111)
Creatinine: 1.16 mg/dL (ref 0.61–1.24)
GFR, Est AFR Am: >60 mL/min (ref >60)
GFR, Estimated: >60 mL/min (ref >60)
Glucose, Bld: 101 mg/dL — ABNORMAL HIGH (ref 70–99)
Potassium: 4.5 mmol/L (ref 3.5–5.1)
Sodium: 143 mmol/L (ref 135–145)
Total Bilirubin: 0.4 mg/dL (ref 0.3–1.2)
Total Protein: 7 g/dL (ref 6.5–8.1)

## 2019-09-18 MED ORDER — IOHEXOL 300 MG/ML  SOLN
100.0000 mL | Freq: Once | INTRAMUSCULAR | Status: AC | PRN
  Administered 2019-09-18: 100 mL via INTRAVENOUS

## 2019-09-18 MED ORDER — SODIUM CHLORIDE (PF) 0.9 % IJ SOLN
INTRAMUSCULAR | Status: AC
  Filled 2019-09-18: qty 50

## 2019-09-18 MED ORDER — IOHEXOL 300 MG/ML  SOLN: 75.0000 mL | Freq: Once | INTRAMUSCULAR | Status: DC | PRN

## 2019-09-19 ENCOUNTER — Other Ambulatory Visit: Payer: Self-pay

## 2019-09-19 ENCOUNTER — Ambulatory Visit: Payer: Medicare Other

## 2019-09-19 ENCOUNTER — Encounter: Payer: PRIVATE HEALTH INSURANCE | Admitting: Nutrition

## 2019-09-19 ENCOUNTER — Ambulatory Visit
Admission: RE | Admit: 2019-09-19 | Discharge: 2019-09-19 | Disposition: A | Discharge status: Home / Self Care | Payer: Medicare Other | Source: Ambulatory Visit | Attending: Radiation Oncology | Admitting: Radiation Oncology

## 2019-09-19 VITALS — BP 139/74 | HR 70 | Temp 97.0°F | Resp 20 | Ht 69.0 in | Wt 153.2 lb

## 2019-09-19 DIAGNOSIS — C3411 Malignant neoplasm of upper lobe, right bronchus or lung: Secondary | ICD-10-CM

## 2019-09-19 DIAGNOSIS — Z79899 Other long term (current) drug therapy: Secondary | ICD-10-CM | POA: Insufficient documentation

## 2019-09-19 DIAGNOSIS — Z923 Personal history of irradiation: Secondary | ICD-10-CM | POA: Diagnosis not present

## 2019-09-19 DIAGNOSIS — J439 Emphysema, unspecified: Secondary | ICD-10-CM | POA: Insufficient documentation

## 2019-09-19 DIAGNOSIS — C3492 Malignant neoplasm of unspecified part of left bronchus or lung: Secondary | ICD-10-CM

## 2019-09-19 DIAGNOSIS — C3412 Malignant neoplasm of upper lobe, left bronchus or lung: Secondary | ICD-10-CM | POA: Insufficient documentation

## 2019-09-19 DIAGNOSIS — C320 Malignant neoplasm of glottis: Secondary | ICD-10-CM

## 2019-09-19 NOTE — Progress Notes (Signed)
Mr. Fiola returns for follow-up of radiation to the larynx completed on 12/10/2018 and SBRT to the right and left lungs on 06/13/2019, and to receive CT scan results from 09/18/2019  Pain issues, if any: Lower back only. Patient denies any ear/jaw or chest pain Using a feeding tube?: No. Patient has stopped using but PEG tube is still in place. He is planning to see Joli-RD next week, and if she thinks his weight is staying consistent he is interested in having PEG removed. Martin Majestic to ED on 07/27/19 after accidentally cutting off top of PEG-->had to replace with 65F foley-->seen by Dr. Kathlene Cote with IR on 07/28/19)  Weight changes, if any:  Wt Readings from Last 3 Encounters:  09/19/19 153 lb 3.2 oz (69.5 kg)  09/09/19 153 lb (69.4 kg)  08/12/19 152 lb 6 oz (69.1 kg)   Swallowing issues, if any: Patient deines any issues. He does report that occasionally drier food seems to take loneger to go down, but otherwise no issues. 05/18/2021Saw Joli Allen-RD: "Met with patient and wife in clinic this pm.  Patient's food diary reviewed.  Drinking mostly boost plus for breakfast.  Eating lunch and dinner more solid foods at least 2 items (sliced [place] meat and fruit, meatballs and noodles, roasted [place] and mashed potatoes, chicken and dumplings, shrimp alfredo.  Drinking boost plus at dinner time as well.  Also drinking lipton tea (thickened) and another cup of fluids per food diary.  Patient has been giving 1 carton of [facility] via feeding tube and prostat 60m BID.  Does not like the taste of prostat orally. SLP notes reviewed from 4/28.  Noted mechanical soft diet with nectar thick liquids via cup"  Smoking or chewing tobacco? None Using fluoride trays daily? N/A Last ENT visit was on: Patient is schedeuled to see Dr. SLeta Baptiston 10/16/19 Respiratory: Patient denies any persistent cough, shortness of breath, or wheexing Other notable issues, if any: Patient denies  Vitals:   09/19/19 1415  BP: 139/74   Pulse: 70  Resp: 20  Temp: (!) 97 F (36.1 C)  SpO2: 100%

## 2019-09-19 NOTE — Progress Notes (Signed)
Oncology Nurse Navigator Documentation  I met with patient during his follow up appointment with Dr. Isidore Moos today. He is feeling well today. At the request of Dr. Isidore Moos I have contacted Genene Churn SLP regarding a possible MBSS this month. I also asked for an outpatient visit with Ms. Starleen Blue. Dr. Isidore Moos will see Mr. Mcglasson in 3 months for results of a CT chest. Mr. Calvey and his wife know to call me if they have any further questions or concerns.   Harlow Asa RN, BSN, OCN Head & Neck Oncology Nurse [place] at [facility] Phone # [phone]  Fax # [phone]

## 2019-09-22 ENCOUNTER — Encounter (HOSPITAL_COMMUNITY): Payer: Self-pay | Admitting: Physical Therapy

## 2019-09-22 ENCOUNTER — Other Ambulatory Visit (HOSPITAL_COMMUNITY): Payer: Self-pay | Admitting: Specialist

## 2019-09-22 ENCOUNTER — Ambulatory Visit (HOSPITAL_COMMUNITY): Payer: Medicare Other | Admitting: Physical Therapy

## 2019-09-22 ENCOUNTER — Other Ambulatory Visit: Payer: Self-pay

## 2019-09-22 ENCOUNTER — Encounter: Payer: Self-pay | Admitting: Radiation Oncology

## 2019-09-22 DIAGNOSIS — C3411 Malignant neoplasm of upper lobe, right bronchus or lung: Secondary | ICD-10-CM

## 2019-09-22 DIAGNOSIS — C3412 Malignant neoplasm of upper lobe, left bronchus or lung: Secondary | ICD-10-CM

## 2019-09-22 DIAGNOSIS — C329 Malignant neoplasm of larynx, unspecified: Secondary | ICD-10-CM

## 2019-09-22 DIAGNOSIS — R1319 Other dysphagia: Secondary | ICD-10-CM

## 2019-09-22 DIAGNOSIS — R2689 Other abnormalities of gait and mobility: Secondary | ICD-10-CM | POA: Diagnosis not present

## 2019-09-22 DIAGNOSIS — M6281 Muscle weakness (generalized): Secondary | ICD-10-CM

## 2019-09-22 NOTE — Therapy (Signed)
Protein-calorie malnutrition, severe 10/08/2018  . PSVT (paroxysmal supraventricular tachycardia) ([place]) 10/03/2018  . Atrial fibrillation ([place]) 10/03/2018  . Goals of care, counseling/discussion   . Dysphagia   . Loss of weight   . 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 Gastritis determined by endoscopy   . Esophageal dysphagia   . 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 Globus sensation 12/12/2016  . Smokes with greater than 40 pack year history 10/20/2016  . Hemorrhoids, internal, with bleeding 10/06/2015  . History of colonic polyps 04/14/2015  . Vitamin D deficiency 10/09/2014  . HLD (hyperlipidemia) 10/08/2012  . GERD (gastroesophageal reflux disease) 10/08/2012  . HTN (hypertension)   . Diverticulitis 10/25/2009    11:57 AM, 09/22/19 Mearl Latin PT, DPT Physical Therapist at [place], [name], [address] Phone: [phone]   Fax:  [phone]  Name: Logan Price MRN: [id number] Date of Birth: 05/04/1945  [place], [name], [address] Phone: [phone]   Fax:  [phone]  Physical Therapy Treatment  Patient Details  Name: Logan Price MRN: [id number] Date of Birth: 1945/08/03 Referring Provider (PT): Dr. Isidore Moos   Encounter Date: 09/22/2019   PT End of Session - 09/22/19 1112    Visit Number 16    Number of Visits 21    Date for PT Re-Evaluation 09/29/19    Progress Note Due on Visit 20    PT Start Time 1114    PT Stop Time 1155    PT Time Calculation (min) 41 min    Equipment Utilized During Treatment Gait belt    Activity Tolerance Patient tolerated treatment well;Patient limited by fatigue    Behavior During Therapy [facility] for tasks assessed/performed           Past Medical History:  Diagnosis Date  . Diverticulitis   . Dysrhythmia 09/2018   episode of SVT while in hospital  . 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 GERD (gastroesophageal reflux disease)   . 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 HTN (hypertension)   . Hyperlipidemia     Past Surgical History:  Procedure Laterality Date  . BIOPSY  05/04/2015   Procedure: BIOPSY;  Surgeon: Danie Binder, MD;  [facility];  Service: Endoscopy;;  bx's of ileocecal valve   . BRONCHIAL BRUSHINGS  04/29/2019   Procedure: BRONCHIAL BRUSHINGS;  Surgeon: Garner Nash, DO;  Location: [place];  Service: Pulmonary;;  . BRONCHIAL NEEDLE ASPIRATION BIOPSY  04/29/2019   Procedure: BRONCHIAL NEEDLE ASPIRATION BIOPSIES;  Surgeon: Garner Nash, DO;  Location: [place];  Service: Pulmonary;;  . BRONCHIAL WASHINGS  04/29/2019   Procedure: BRONCHIAL WASHINGS;  Surgeon: Garner Nash, DO;  [facility];  Service: Pulmonary;;  . COLONOSCOPY WITH PROPOFOL N/A 05/04/2015   Dr. Oneida Alar: normal appearing ileum with prominent IC valve with tubular adenomas, moderate diverticulosis in sigmoid colon, ascending colon, and retum.  Moderate sized internal hemorrhoids. Surveillance in 5 years  . ELECTROMAGNETIC NAVIGATION BROCHOSCOPY  04/29/2019   Procedure: NAVIGATION BRONCHOSCOPY;  Surgeon: Garner Nash, DO;  Location: [place] [facility];  Service: Pulmonary;;  . ESOPHAGOGASTRODUODENOSCOPY (EGD) WITH PROPOFOL N/A 12/19/2016   Procedure: ESOPHAGOGASTRODUODENOSCOPY (EGD) WITH PROPOFOL;  Surgeon: Danie Binder, MD;  [facility];  Service: Endoscopy;  Laterality: N/A;  11:30am  . FLEXIBLE SIGMOIDOSCOPY N/A 12/10/2015   hemorrhoid banding X 3   . HEMORRHOID BANDING N/A 12/10/2015   Procedure: HEMORRHOID BANDING;  Surgeon: Danie Binder, MD;  [facility];  Service: Endoscopy;  Laterality: N/A;  1:30 PM  . INSERTION OF SUPRAPUBIC CATHETER N/A 02/24/2019   Procedure: INSERTION OF SUPRAPUBIC CATHETER;  Surgeon: Kathie Rhodes, MD;  [facility];  Service: Urology;  Laterality: N/A;  . IR CM INJ ANY COLONIC TUBE W/FLUORO  10/30/2018  . IR GASTROSTOMY TUBE MOD SED  10/04/2018  . IR IMAGING GUIDED PORT INSERTION  10/04/2018  . IR REMOVAL TUN ACCESS W/ PORT W/O FL MOD SED  10/14/2018  . IR REPLACE G-TUBE SIMPLE WO FLUORO  11/03/2018  . IR REPLACE G-TUBE SIMPLE WO FLUORO  07/28/2019  . LAPAROSCOPIC INSERTION GASTROSTOMY TUBE Left 10/07/2018   Procedure: LAPAROSCOPIC  GASTROSTOMY TUBE;  Surgeon: Kinsinger, Arta Bruce, MD;  Location: [place];  Service: General;  Laterality: Left;  Marland Kitchen MICROLARYNGOSCOPY N/A 09/27/2018   Procedure: MICRO DIRECT LARYNGOSCOPY  [place], [name], [address] Phone: [phone]   Fax:  [phone]  Physical Therapy Treatment  Patient Details  Name: Logan Price MRN: [id number] Date of Birth: 1945/08/03 Referring Provider (PT): Dr. Isidore Moos   Encounter Date: 09/22/2019   PT End of Session - 09/22/19 1112    Visit Number 16    Number of Visits 21    Date for PT Re-Evaluation 09/29/19    Progress Note Due on Visit 20    PT Start Time 1114    PT Stop Time 1155    PT Time Calculation (min) 41 min    Equipment Utilized During Treatment Gait belt    Activity Tolerance Patient tolerated treatment well;Patient limited by fatigue    Behavior During Therapy [facility] for tasks assessed/performed           Past Medical History:  Diagnosis Date  . Diverticulitis   . Dysrhythmia 09/2018   episode of SVT while in hospital  . 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 GERD (gastroesophageal reflux disease)   . 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 HTN (hypertension)   . Hyperlipidemia     Past Surgical History:  Procedure Laterality Date  . BIOPSY  05/04/2015   Procedure: BIOPSY;  Surgeon: Danie Binder, MD;  [facility];  Service: Endoscopy;;  bx's of ileocecal valve   . BRONCHIAL BRUSHINGS  04/29/2019   Procedure: BRONCHIAL BRUSHINGS;  Surgeon: Garner Nash, DO;  Location: [place];  Service: Pulmonary;;  . BRONCHIAL NEEDLE ASPIRATION BIOPSY  04/29/2019   Procedure: BRONCHIAL NEEDLE ASPIRATION BIOPSIES;  Surgeon: Garner Nash, DO;  Location: [place];  Service: Pulmonary;;  . BRONCHIAL WASHINGS  04/29/2019   Procedure: BRONCHIAL WASHINGS;  Surgeon: Garner Nash, DO;  [facility];  Service: Pulmonary;;  . COLONOSCOPY WITH PROPOFOL N/A 05/04/2015   Dr. Oneida Alar: normal appearing ileum with prominent IC valve with tubular adenomas, moderate diverticulosis in sigmoid colon, ascending colon, and retum.  Moderate sized internal hemorrhoids. Surveillance in 5 years  . ELECTROMAGNETIC NAVIGATION BROCHOSCOPY  04/29/2019   Procedure: NAVIGATION BRONCHOSCOPY;  Surgeon: Garner Nash, DO;  Location: [place] [facility];  Service: Pulmonary;;  . ESOPHAGOGASTRODUODENOSCOPY (EGD) WITH PROPOFOL N/A 12/19/2016   Procedure: ESOPHAGOGASTRODUODENOSCOPY (EGD) WITH PROPOFOL;  Surgeon: Danie Binder, MD;  [facility];  Service: Endoscopy;  Laterality: N/A;  11:30am  . FLEXIBLE SIGMOIDOSCOPY N/A 12/10/2015   hemorrhoid banding X 3   . HEMORRHOID BANDING N/A 12/10/2015   Procedure: HEMORRHOID BANDING;  Surgeon: Danie Binder, MD;  [facility];  Service: Endoscopy;  Laterality: N/A;  1:30 PM  . INSERTION OF SUPRAPUBIC CATHETER N/A 02/24/2019   Procedure: INSERTION OF SUPRAPUBIC CATHETER;  Surgeon: Kathie Rhodes, MD;  [facility];  Service: Urology;  Laterality: N/A;  . IR CM INJ ANY COLONIC TUBE W/FLUORO  10/30/2018  . IR GASTROSTOMY TUBE MOD SED  10/04/2018  . IR IMAGING GUIDED PORT INSERTION  10/04/2018  . IR REMOVAL TUN ACCESS W/ PORT W/O FL MOD SED  10/14/2018  . IR REPLACE G-TUBE SIMPLE WO FLUORO  11/03/2018  . IR REPLACE G-TUBE SIMPLE WO FLUORO  07/28/2019  . LAPAROSCOPIC INSERTION GASTROSTOMY TUBE Left 10/07/2018   Procedure: LAPAROSCOPIC  GASTROSTOMY TUBE;  Surgeon: Kinsinger, Arta Bruce, MD;  Location: [place];  Service: General;  Laterality: Left;  Marland Kitchen MICROLARYNGOSCOPY N/A 09/27/2018   Procedure: MICRO DIRECT LARYNGOSCOPY  Protein-calorie malnutrition, severe 10/08/2018  . PSVT (paroxysmal supraventricular tachycardia) ([place]) 10/03/2018  . Atrial fibrillation ([place]) 10/03/2018  . Goals of care, counseling/discussion   . Dysphagia   . Loss of weight   . 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 Gastritis determined by endoscopy   . Esophageal dysphagia   . 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 Globus sensation 12/12/2016  . Smokes with greater than 40 pack year history 10/20/2016  . Hemorrhoids, internal, with bleeding 10/06/2015  . History of colonic polyps 04/14/2015  . Vitamin D deficiency 10/09/2014  . HLD (hyperlipidemia) 10/08/2012  . GERD (gastroesophageal reflux disease) 10/08/2012  . HTN (hypertension)   . Diverticulitis 10/25/2009    11:57 AM, 09/22/19 Mearl Latin PT, DPT Physical Therapist at [place], [name], [address] Phone: [phone]   Fax:  [phone]  Name: Logan Price MRN: [id number] Date of Birth: 05/04/1945

## 2019-09-22 NOTE — Progress Notes (Signed)
Radiation Oncology         [phone] ________________________________  Name: FARREN DIMITRIOU MRN: [id number]  Date: 09/19/2019  DOB: 23-Jan-1946  Follow-Up Visit Note  CC: [name], FNP  Newman Pies, MD  Diagnosis and Prior Radiotherapy:       ICD-10-CM   1. Malignant neoplasm of right upper lobe of lung (HCC)  C34.11   2. Primary cancer of left upper lobe of lung (HCC)  C34.12   3. Squamous cell carcinoma of glottis (HCC)  C32.0   4. Adenocarcinoma, lung, left (HCC)  C34.92     Radiation Treatment Dates: 10/22/2018 through 12/10/2018 Site Technique Total Dose Dose per Fx Completed Fx Beam Energies  Head & neck: HN_larynx IMRT 70/70 2 35/35 6X   Radiation Treatment Dates: 06/03/2019 through 06/13/2019 Site Technique Total Dose (Gy) Dose per Fx (Gy) Completed Fx Beam Energies  Lung, Right: Lung_Rt_Upper IMRT 60/60 12 5/5 6XFFF  Lung, Left: Lung_Lt_Upper IMRT 60/60 12 5/5 6XFFF    CHIEF COMPLAINT:  Here for follow-up and surveillance of throat and lung cancer   Narrative:    Mr. Buzzetta returns for follow-up of radiation to the larynx completed on 12/10/2018 and SBRT to the right and left lungs on 06/13/2019, and to receive CT scan results from 09/18/2019  Pain issues, if any: Lower back only. Patient denies any ear/jaw or chest pain  Using a feeding tube?: No. Patient has stopped using but PEG tube is still in place. He is planning to see Joli-RD next week, and if she thinks his weight is staying consistent he is interested in having PEG removed. Micah Flesher to ED on 07/27/19 after accidentally cutting off top of PEG-->had to replace with 51F foley-->seen by Dr. Fredia Sorrow with IR on 07/28/19)   Weight changes, if any:  Wt Readings from Last 3 Encounters:  09/19/19 153 lb 3.2 oz (69.5 kg)  09/09/19 153 lb (69.4 kg)  08/12/19 152 lb 6 oz (69.1 kg)   Swallowing issues, if any: Patient deines any issues. He does report that occasionally drier food seems to take loneger to go down, but  otherwise no issues. 05/18/2021Saw Joli Allen-RD: "Met with patient and wife in clinic this pm.  Patient's food diary reviewed.  Drinking mostly boost plus for breakfast.  Eating lunch and dinner more solid foods at least 2 items (sliced [place] meat and fruit, meatballs and noodles, roasted [place] and mashed potatoes, chicken and dumplings, shrimp alfredo.  Drinking boost plus at dinner time as well.  Also drinking lipton tea (thickened) and another cup of fluids per food diary.  Patient has been giving 1 carton of [facility] via feeding tube and prostat 30ml BID.  Does not like the taste of prostat orally. SLP notes reviewed from 4/28.  Noted mechanical soft diet with nectar thick liquids via cup"   Smoking or chewing tobacco? None Using fluoride trays daily? N/A Last ENT visit was on: Patient is schedeuled to see Dr. Newman Pies on 10/16/19 Respiratory: Patient denies any persistent cough, shortness of breath, or wheexing Other notable issues, if any: Patient denies   Vitals:   09/19/19 1415  BP: 139/74  Pulse: 70  Resp: 20  Temp: (!) 97 F (36.1 C)  SpO2: 100%     ALLERGIES:  has No Known Allergies.  Meds: Current Outpatient Medications  Medication Sig Dispense Refill  . doxazosin (CARDURA) 1 MG tablet Place 1 tablet (1 mg total) into feeding tube daily. (Patient taking differently: Take 1 mg  by mouth daily. ) 30 tablet 5  . pantoprazole (PROTONIX) 40 MG tablet 1 po 30 mins prior to first meal (Patient taking differently: Take 40 mg by mouth daily. ) 90 tablet 3  . simvastatin (ZOCOR) 20 MG tablet Take 1 tablet (20 mg total) by mouth daily at 6 PM. 90 tablet 1  . Water For Irrigation, Sterile (FREE WATER) SOLN Place 200 mLs into feeding tube every 4 (four) hours.     No current facility-administered medications for this encounter.    Physical Findings: The patient is in no acute distress. Patient is alert and oriented. Wt Readings from Last 3 Encounters:  09/19/19 153 lb 3.2 oz  (69.5 kg)  09/09/19 153 lb (69.4 kg)  08/12/19 152 lb 6 oz (69.1 kg)    height is 5\' 9"  (1.753 m) and weight is 153 lb 3.2 oz (69.5 kg). His temperature is 97 F (36.1 C) (abnormal). His blood pressure is 139/74 and his pulse is 70. His respiration is 20 and oxygen saturation is 100%. .  General: Alert and oriented, in no acute distress.  No respiratory distress HEENT: Head is normocephalic. Extraocular movements are intact. Oropharynx is notable for no thrush or lesions Neck: no palpable adenopathy Chest: CTAB Psychiatric: Judgment and insight are intact. Affect is appropriate.   Lab Findings: Lab Results  Component Value Date   WBC 10.1 04/30/2019   HGB 11.8 (L) 04/30/2019   HCT 37.7 (L) 04/30/2019   MCV 90.4 04/30/2019   PLT 212 04/30/2019    Lab Results  Component Value Date   TSH 3.910 04/09/2019    Radiographic Findings: CT Soft Tissue Neck W Contrast  Result Date: 09/19/2019 CLINICAL DATA:  Lung cancer. Laryngeal cancer with radiation to the neck. Radiation completed 11/2018 EXAM: CT NECK WITH CONTRAST TECHNIQUE: Multidetector CT imaging of the neck was performed using the standard protocol following the bolus administration of intravenous contrast. CONTRAST:  OMNIPAQUE IOHEXOL 300 MG/ML  SOLN COMPARISON:  CT neck 04/02/2019 FINDINGS: Pharynx and larynx: Post radiation changes in the larynx with diffuse edema. Previously noted tumor enhancement posterior to the larynx on the right is improved. No defined residual mass identified. Airway intact. Tracheostomy has been removed. Salivary glands: Post radiation changes in the parotid and submandibular gland bilaterally without acute inflammation or mass. Thyroid: Negative Lymph nodes: Previously noted right-sided lymph node mass has resolved. 7 mm right level 2 lymph node with small calcification is unchanged. No new or enlarged lymph nodes identified. Vascular: Jugular vein patent bilaterally. Atherosclerotic calcification in  the carotid bifurcation bilaterally. Carotid artery patent bilaterally. Limited intracranial: Negative Visualized orbits: Negative Mastoids and visualized paranasal sinuses: Paranasal sinuses clear. Mastoid clear bilaterally. Skeleton: Cervical spondylosis and spurring most prominent C5-6 and C6-7. No acute skeletal lesion. Upper chest: Apical emphysema and scarring.  No acute abnormality Other: None IMPRESSION: Post radiation changes in the larynx. Previously noted enhancing mass posterolateral to the larynx on the right no longer visualized. No enlarged lymph nodes in the neck. No evidence of recurrent tumor. Atherosclerotic calcification carotid bifurcation bilaterally Electronically Signed   By: Marlan Palau M.D.   On: 09/19/2019 12:52   CT Chest W Contrast  Result Date: 09/18/2019 CLINICAL DATA:  Head and neck cancer with lung metastases. Restaging. Status post SB RT to right and left upper lobe lesions, completed 06/13/2019. EXAM: CT CHEST WITH CONTRAST TECHNIQUE: Multidetector CT imaging of the chest was performed during intravenous contrast administration. CONTRAST:  OMNIPAQUE IOHEXOL 300 MG/ML  SOLN COMPARISON:  Chest CT 04/02/2019. FINDINGS: Cardiovascular: The heart size is normal. No substantial pericardial effusion. Coronary artery calcification is evident. Atherosclerotic calcification is noted in the wall of the thoracic aorta. Mediastinum/Nodes: No mediastinal lymphadenopathy. There is no hilar lymphadenopathy. The esophagus has normal imaging features. There is no axillary lymphadenopathy. Lungs/Pleura: Centrilobular and paraseptal emphysema evident. Mucus/secretions noted dependent trachea. 1.6 x 1.4 cm anterior right upper lobe pulmonary nodule is not substantially changed from 1.5 x 1.3 cm previously. On today's study there is adjacent volume loss and bandlike opacity suggesting sequelae of radiation. Spiculated left upper lobe non solid lesion measured previously at 2.5 x 1.7 cm has  decreased in size and become more confluent in the interval measuring 1.3 x 0.9 cm today. Fiducial markers are evident adjacent to this lesion. Areas of bandlike architectural distortion adjacent to this lesion suggests sequelae of radiation treatment. Chronic atelectasis again noted in the lingula subtle peripheral nodularity in the posterior left lung base is stable, likely sequelae of prior infection/inflammation. No new suspicious pulmonary nodule or mass. Calcified granulomata again noted medial right base. Upper Abdomen: Gastrostomy tube noted. 1.9 cm probable cyst upper pole right kidney has been incompletely visualized. Otherwise unremarkable. Musculoskeletal: No worrisome lytic or sclerotic osseous abnormality. IMPRESSION: 1. Interval decrease in size of the non solid left upper lobe pulmonary lesion with adjacent bandlike architectural distortion and volume loss. Imaging features consistent with sequelae of radiation treatment. 2. Stable 1.6 cm anterior right upper lobe pulmonary nodule with adjacent volume loss and bandlike opacity suggesting treatment related changes. 3. No new suspicious findings on today's exam. 4. Aortic Atherosclerosis (ICD10-I70.0) and Emphysema (ICD10-J43.9). Electronically Signed   By: Kennith Center M.D.   On: 09/18/2019 12:56    Impression/Plan:    1) Head and Neck Cancer Status: No evidence of progression or recurrence in the neck or chest per recent CT scans  2) Nutritional Status: stable PEG tube: He would like to have this removed. According to his last speech-language pathology follow-up, a modified barium swallowing study was anticipated for this month. I do not see evidence that that was done. I have asked our navigator to get in touch with speech-language pathology. We will need him to be evaluated and cleared before PEG is removed.  3) Risk Factors: The patient has been educated about risk factors including alcohol and tobacco abuse; they understand that  avoidance of alcohol and tobacco is important to prevent recurrences as well as other cancers  4) Swallowing: continue SLP exercises, swallowing has improved since trach removal; see #2  5)  Thyroid function: will check annually Lab Results  Component Value Date   TSH 3.910 04/09/2019    6)  Follow-up in 3 months with repeat chest CT. Continue close follow-up with otolaryngology.   On date of service, in total, I spent 20 minutes on this encounter.  Patient was seen face-to-face _____________________________________   Lonie Peak, MD

## 2019-09-23 ENCOUNTER — Other Ambulatory Visit: Payer: Self-pay

## 2019-09-23 ENCOUNTER — Other Ambulatory Visit (INDEPENDENT_AMBULATORY_CARE_PROVIDER_SITE_OTHER): Payer: Self-pay | Admitting: Internal Medicine

## 2019-09-23 ENCOUNTER — Inpatient Hospital Stay: Payer: Medicare Other | Attending: Hematology

## 2019-09-23 NOTE — Progress Notes (Signed)
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  PEG placed in July 2020.  Patient has completed radiation for bilateral lung nodules.    Met with patient and wife in clinic.  Patient brought food diary.  Reviewed SLP notes.  Patient is eating all consistencies of foods and drinking thin liquids.  Drinking ensure/boost plus, water and tea.    Patient has not been using feeding tube for formula or protein liquid for > 4 weeks.   Patient is flushing feeding tube with 17m of water in the am and pm.    Having bowel movement daily   Medications: reviewed  Labs: reviewed  Anthropometrics:   Weight 154 lb 4 oz today increased from 152 lb 6 oz on 5/18 152 lb 2 oz on 4/20 149 lb on 3/23 148 lb on 2/23 143 lb 6 oz on 1/26  NUTRITION DIAGNOSIS: Inadequate oral intake improved   INTERVENTION:  Patient eating orally and able to increase weight without using feeding tube for > 4 weeks. Patient to continue flushing tube at least 1 time per day until decision made to remove tube or not.  Patient to continue PEG care as well.  Encouraged patient to continue eating high calorie, high protein foods to maintain weight Continue ensure/boost plus for added nutrition Patient has contact information    MONITORING, EVALUATION, GOAL: weight trends, intake   NEXT VISIT: August 3rd, phone call  Edvin Albus B. AZenia Resides RNorthern [place] LColumbusRegistered Dietitian [phone](pager)

## 2019-09-24 ENCOUNTER — Ambulatory Visit (HOSPITAL_COMMUNITY): Payer: Medicare Other | Admitting: Speech Pathology

## 2019-09-25 ENCOUNTER — Encounter (HOSPITAL_COMMUNITY): Payer: Self-pay | Admitting: Speech Pathology

## 2019-09-25 ENCOUNTER — Other Ambulatory Visit: Payer: Self-pay

## 2019-09-25 ENCOUNTER — Ambulatory Visit (HOSPITAL_COMMUNITY): Payer: Medicare Other | Admitting: Speech Pathology

## 2019-09-25 ENCOUNTER — Ambulatory Visit (HOSPITAL_COMMUNITY)
Admission: RE | Admit: 2019-09-25 | Discharge: 2019-09-25 | Disposition: A | Discharge status: Home / Self Care | Payer: Medicare Other | Source: Ambulatory Visit | Attending: Radiation Oncology | Admitting: Radiation Oncology

## 2019-09-25 ENCOUNTER — Ambulatory Visit (HOSPITAL_COMMUNITY): Payer: Medicare Other | Attending: Family | Admitting: Physical Therapy

## 2019-09-25 DIAGNOSIS — R1312 Dysphagia, oropharyngeal phase: Secondary | ICD-10-CM | POA: Diagnosis not present

## 2019-09-25 DIAGNOSIS — R1310 Dysphagia, unspecified: Secondary | ICD-10-CM | POA: Diagnosis not present

## 2019-09-25 DIAGNOSIS — R2689 Other abnormalities of gait and mobility: Secondary | ICD-10-CM | POA: Diagnosis not present

## 2019-09-25 DIAGNOSIS — C329 Malignant neoplasm of larynx, unspecified: Secondary | ICD-10-CM | POA: Diagnosis not present

## 2019-09-25 DIAGNOSIS — R1319 Other dysphagia: Secondary | ICD-10-CM

## 2019-09-25 DIAGNOSIS — M6281 Muscle weakness (generalized): Secondary | ICD-10-CM

## 2019-09-25 NOTE — Therapy (Signed)
[facility] [address] [place], [name], [address] Phone: [phone]   Fax:  [phone]  Physical Therapy Treatment  Patient Details  Name: Logan Price MRN: [id number] Date of Birth: 06-17-1945 Referring Provider (PT): Dr. Isidore Moos   Encounter Date: 09/25/2019   PT End of Session - 09/25/19 1503    Visit Number 17    Number of Visits 21    Date for PT Re-Evaluation 09/29/19    PT Start Time 7628    PT Stop Time 1430    PT Time Calculation (min) 45 min    Activity Tolerance Patient tolerated treatment well           Past Medical History:  Diagnosis Date  . Diverticulitis   . Dysrhythmia 09/2018   episode of SVT while in hospital  . 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 GERD (gastroesophageal reflux disease)   . 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 HTN (hypertension)   . Hyperlipidemia     Past Surgical History:  Procedure Laterality Date  . BIOPSY  05/04/2015   Procedure: BIOPSY;  Surgeon: Danie Binder, MD;  [facility];  Service: Endoscopy;;  bx's of ileocecal valve   . BRONCHIAL BRUSHINGS  04/29/2019   Procedure: BRONCHIAL BRUSHINGS;  Surgeon: Garner Nash, DO;  Location: [facility];  Service: Pulmonary;;  . BRONCHIAL NEEDLE ASPIRATION BIOPSY  04/29/2019   Procedure: BRONCHIAL NEEDLE ASPIRATION BIOPSIES;  Surgeon: Garner Nash, DO;  Location: [facility];  Service: Pulmonary;;  . BRONCHIAL WASHINGS  04/29/2019   Procedure: BRONCHIAL WASHINGS;  Surgeon: Garner Nash, DO;  [facility]: [facility];  Service: Pulmonary;;  . COLONOSCOPY WITH PROPOFOL N/A 05/04/2015   Dr. Oneida Alar: normal appearing ileum with prominent IC valve with tubular adenomas, moderate diverticulosis in sigmoid colon, ascending colon, and retum. Moderate sized internal hemorrhoids. Surveillance in 5 years  . ELECTROMAGNETIC NAVIGATION BROCHOSCOPY  04/29/2019   Procedure: NAVIGATION BRONCHOSCOPY;  Surgeon: Garner Nash, DO;  Location: [facility];  Service: Pulmonary;;  . ESOPHAGOGASTRODUODENOSCOPY (EGD) WITH PROPOFOL N/A 12/19/2016   Procedure: ESOPHAGOGASTRODUODENOSCOPY (EGD) WITH PROPOFOL;  Surgeon: Danie Binder, MD;  [facility];  Service: Endoscopy;  Laterality: N/A;  11:30am  . FLEXIBLE SIGMOIDOSCOPY N/A 12/10/2015   hemorrhoid banding X 3   . HEMORRHOID BANDING N/A 12/10/2015   Procedure: HEMORRHOID BANDING;  Surgeon: Danie Binder, MD;  [facility];  Service: Endoscopy;  Laterality: N/A;  1:30 PM  . INSERTION OF SUPRAPUBIC CATHETER N/A 02/24/2019   Procedure: INSERTION OF SUPRAPUBIC CATHETER;  Surgeon: Kathie Rhodes, MD;  Location: [facility];  Service: Urology;  Laterality: N/A;  . IR CM INJ ANY COLONIC TUBE W/FLUORO  10/30/2018  . IR GASTROSTOMY TUBE MOD SED  10/04/2018  . IR IMAGING GUIDED PORT INSERTION  10/04/2018  . IR REMOVAL TUN ACCESS W/ PORT W/O FL MOD SED  10/14/2018  . IR REPLACE G-TUBE SIMPLE WO FLUORO  11/03/2018  . IR REPLACE G-TUBE SIMPLE WO FLUORO  07/28/2019  . LAPAROSCOPIC INSERTION GASTROSTOMY TUBE Left 10/07/2018   Procedure: LAPAROSCOPIC  GASTROSTOMY TUBE;  Surgeon: Kinsinger, Arta Bruce, MD;  Location: [facility];  Service: General;  Laterality: Left;  [facility] MICROLARYNGOSCOPY N/A 09/27/2018   Procedure: MICRO DIRECT LARYNGOSCOPY WITH BIOPSY;  Surgeon: Leta Baptist, MD;  Location: [facility];  Service: ENT;  Laterality: N/A;  . None to date     As of 04/14/15  .  [facility] [address] [place], [name], [address] Phone: [phone]   Fax:  [phone]  Physical Therapy Treatment  Patient Details  Name: Logan Price MRN: [id number] Date of Birth: 06-17-1945 Referring Provider (PT): Dr. Isidore Moos   Encounter Date: 09/25/2019   PT End of Session - 09/25/19 1503    Visit Number 17    Number of Visits 21    Date for PT Re-Evaluation 09/29/19    PT Start Time 7628    PT Stop Time 1430    PT Time Calculation (min) 45 min    Activity Tolerance Patient tolerated treatment well           Past Medical History:  Diagnosis Date  . Diverticulitis   . Dysrhythmia 09/2018   episode of SVT while in hospital  . 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 GERD (gastroesophageal reflux disease)   . 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 HTN (hypertension)   . Hyperlipidemia     Past Surgical History:  Procedure Laterality Date  . BIOPSY  05/04/2015   Procedure: BIOPSY;  Surgeon: Danie Binder, MD;  [facility];  Service: Endoscopy;;  bx's of ileocecal valve   . BRONCHIAL BRUSHINGS  04/29/2019   Procedure: BRONCHIAL BRUSHINGS;  Surgeon: Garner Nash, DO;  Location: [facility];  Service: Pulmonary;;  . BRONCHIAL NEEDLE ASPIRATION BIOPSY  04/29/2019   Procedure: BRONCHIAL NEEDLE ASPIRATION BIOPSIES;  Surgeon: Garner Nash, DO;  Location: [facility];  Service: Pulmonary;;  . BRONCHIAL WASHINGS  04/29/2019   Procedure: BRONCHIAL WASHINGS;  Surgeon: Garner Nash, DO;  [facility];  Service: Pulmonary;;  . COLONOSCOPY WITH PROPOFOL N/A 05/04/2015   Dr. Oneida Alar: normal appearing ileum with prominent IC valve with tubular adenomas, moderate diverticulosis in sigmoid colon, ascending colon, and retum. Moderate sized internal hemorrhoids. Surveillance in 5 years  . ELECTROMAGNETIC NAVIGATION BROCHOSCOPY  04/29/2019   Procedure: NAVIGATION BRONCHOSCOPY;  Surgeon: Garner Nash, DO;  Location: [facility];  Service: Pulmonary;;  . ESOPHAGOGASTRODUODENOSCOPY (EGD) WITH PROPOFOL N/A 12/19/2016   Procedure: ESOPHAGOGASTRODUODENOSCOPY (EGD) WITH PROPOFOL;  Surgeon: Danie Binder, MD;  [facility];  Service: Endoscopy;  Laterality: N/A;  11:30am  . FLEXIBLE SIGMOIDOSCOPY N/A 12/10/2015   hemorrhoid banding X 3   . HEMORRHOID BANDING N/A 12/10/2015   Procedure: HEMORRHOID BANDING;  Surgeon: Danie Binder, MD;  [facility];  Service: Endoscopy;  Laterality: N/A;  1:30 PM  . INSERTION OF SUPRAPUBIC CATHETER N/A 02/24/2019   Procedure: INSERTION OF SUPRAPUBIC CATHETER;  Surgeon: Kathie Rhodes, MD;  [facility];  Service: Urology;  Laterality: N/A;  . IR CM INJ ANY COLONIC TUBE W/FLUORO  10/30/2018  . IR GASTROSTOMY TUBE MOD SED  10/04/2018  . IR IMAGING GUIDED PORT INSERTION  10/04/2018  . IR REMOVAL TUN ACCESS W/ PORT W/O FL MOD SED  10/14/2018  . IR REPLACE G-TUBE SIMPLE WO FLUORO  11/03/2018  . IR REPLACE G-TUBE SIMPLE WO FLUORO  07/28/2019  . LAPAROSCOPIC INSERTION GASTROSTOMY TUBE Left 10/07/2018   Procedure: LAPAROSCOPIC  GASTROSTOMY TUBE;  Surgeon: Kinsinger, Arta Bruce, MD;  Location: [facility];  Service: General;  Laterality: Left;  [facility] Kitchen MICROLARYNGOSCOPY N/A 09/27/2018   Procedure: MICRO DIRECT LARYNGOSCOPY WITH BIOPSY;  Surgeon: Leta Baptist, MD;  Location: [facility];  Service: ENT;  Laterality: N/A;  . None to date     As of 04/14/15  .  polyps 04/14/2015  . Vitamin D deficiency 10/09/2014  . HLD (hyperlipidemia) 10/08/2012  . GERD (gastroesophageal reflux disease) 10/08/2012  . HTN (hypertension)   . Diverticulitis 10/25/2009   Teena Irani, PTA/CLT [phone]   Teena Irani 09/25/2019, 3:07 PM  [facility] [address] [place], [name], [address] Phone: [phone]   Fax:  [phone]  Name: Logan Price MRN: [id number] Date of Birth: 08/16/1945  [facility] [address] [place], [name], [address] Phone: [phone]   Fax:  [phone]  Physical Therapy Treatment  Patient Details  Name: Logan Price MRN: [id number] Date of Birth: 06-17-1945 Referring Provider (PT): Dr. Isidore Moos   Encounter Date: 09/25/2019   PT End of Session - 09/25/19 1503    Visit Number 17    Number of Visits 21    Date for PT Re-Evaluation 09/29/19    PT Start Time 7628    PT Stop Time 1430    PT Time Calculation (min) 45 min    Activity Tolerance Patient tolerated treatment well           Past Medical History:  Diagnosis Date  . Diverticulitis   . Dysrhythmia 09/2018   episode of SVT while in hospital  . 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 GERD (gastroesophageal reflux disease)   . 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 HTN (hypertension)   . Hyperlipidemia     Past Surgical History:  Procedure Laterality Date  . BIOPSY  05/04/2015   Procedure: BIOPSY;  Surgeon: Danie Binder, MD;  [facility];  Service: Endoscopy;;  bx's of ileocecal valve   . BRONCHIAL BRUSHINGS  04/29/2019   Procedure: BRONCHIAL BRUSHINGS;  Surgeon: Garner Nash, DO;  Location: [facility];  Service: Pulmonary;;  . BRONCHIAL NEEDLE ASPIRATION BIOPSY  04/29/2019   Procedure: BRONCHIAL NEEDLE ASPIRATION BIOPSIES;  Surgeon: Garner Nash, DO;  Location: [facility];  Service: Pulmonary;;  . BRONCHIAL WASHINGS  04/29/2019   Procedure: BRONCHIAL WASHINGS;  Surgeon: Garner Nash, DO;  [facility];  Service: Pulmonary;;  . COLONOSCOPY WITH PROPOFOL N/A 05/04/2015   Dr. Oneida Alar: normal appearing ileum with prominent IC valve with tubular adenomas, moderate diverticulosis in sigmoid colon, ascending colon, and retum. Moderate sized internal hemorrhoids. Surveillance in 5 years  . ELECTROMAGNETIC NAVIGATION BROCHOSCOPY  04/29/2019   Procedure: NAVIGATION BRONCHOSCOPY;  Surgeon: Garner Nash, DO;  Location: [facility];  Service: Pulmonary;;  . ESOPHAGOGASTRODUODENOSCOPY (EGD) WITH PROPOFOL N/A 12/19/2016   Procedure: ESOPHAGOGASTRODUODENOSCOPY (EGD) WITH PROPOFOL;  Surgeon: Danie Binder, MD;  [facility];  Service: Endoscopy;  Laterality: N/A;  11:30am  . FLEXIBLE SIGMOIDOSCOPY N/A 12/10/2015   hemorrhoid banding X 3   . HEMORRHOID BANDING N/A 12/10/2015   Procedure: HEMORRHOID BANDING;  Surgeon: Danie Binder, MD;  [facility];  Service: Endoscopy;  Laterality: N/A;  1:30 PM  . INSERTION OF SUPRAPUBIC CATHETER N/A 02/24/2019   Procedure: INSERTION OF SUPRAPUBIC CATHETER;  Surgeon: Kathie Rhodes, MD;  Location[facility];  Service: Urology;  Laterality: N/A;  . IR CM INJ ANY COLONIC TUBE W/FLUORO  10/30/2018  . IR GASTROSTOMY TUBE MOD SED  10/04/2018  . IR IMAGING GUIDED PORT INSERTION  10/04/2018  . IR REMOVAL TUN ACCESS W/ PORT W/O FL MOD SED  10/14/2018  . IR REPLACE G-TUBE SIMPLE WO FLUORO  11/03/2018  . IR REPLACE G-TUBE SIMPLE WO FLUORO  07/28/2019  . LAPAROSCOPIC INSERTION GASTROSTOMY TUBE Left 10/07/2018   Procedure: LAPAROSCOPIC  GASTROSTOMY TUBE;  Surgeon: Kinsinger, Arta Bruce, MD;  Location: [facility];  Service: General;  Laterality: Left;  [facility] Kitchen MICROLARYNGOSCOPY N/A 09/27/2018   Procedure: MICRO DIRECT LARYNGOSCOPY WITH BIOPSY;  Surgeon: Leta Baptist, MD;  Location: [facility];  Service: ENT;  Laterality: N/A;  . None to date     As of 04/14/15  .

## 2019-09-25 NOTE — Therapy (Signed)
Kathline Banbury 09/25/2019, 2:37 PM  [facility] [address] [place], [name], [address] Phone: [phone]   Fax:  [phone]  Name: KAMARIE PALMA MRN: [id number] Date of Birth: [birth year]  [name], [name], [address] Phone: [phone]   Fax:  [phone]  Modified Barium Swallow  Patient Details  Name: NALU TROUBLEFIELD MRN: [id number] Date of Birth: March 07, 1946 Referring Provider (SLP): Eppie Gibson, MD   Encounter Date: 09/25/2019   End of Session - 09/25/19 1432    Visit Number 1    Number of Visits 1    Authorization Type Medicare    SLP Start Time 2778    SLP Stop Time  1212    SLP Time Calculation (min) 37 min    Activity Tolerance Patient tolerated treatment well           Past Medical History:  Diagnosis Date  . Diverticulitis   . Dysrhythmia 09/2018   episode of SVT while in hospital  . 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 GERD (gastroesophageal reflux disease)   . 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 HTN (hypertension)   . Hyperlipidemia     Past Surgical History:  Procedure Laterality Date  . BIOPSY  05/04/2015   Procedure: BIOPSY;  Surgeon: Danie Binder, MD;  [facility];  Service: Endoscopy;;  bx's of ileocecal valve   . BRONCHIAL BRUSHINGS  04/29/2019   Procedure: BRONCHIAL BRUSHINGS;  Surgeon: Garner Nash, DO;  Location: [facility];  Service: Pulmonary;;  . BRONCHIAL NEEDLE ASPIRATION BIOPSY  04/29/2019   Procedure: BRONCHIAL NEEDLE ASPIRATION BIOPSIES;  Surgeon: Garner Nash, DO;  Location: [facility];  Service: Pulmonary;;  . BRONCHIAL WASHINGS  04/29/2019   Procedure: BRONCHIAL WASHINGS;  Surgeon: Garner Nash, DO;  Location: [facility];  Service: Pulmonary;;  . COLONOSCOPY WITH PROPOFOL N/A 05/04/2015   Dr. Oneida Alar: normal appearing ileum with prominent IC valve with tubular adenomas, moderate diverticulosis in sigmoid colon, ascending colon, and retum. Moderate sized internal hemorrhoids. Surveillance in 5 years  . ELECTROMAGNETIC NAVIGATION BROCHOSCOPY  04/29/2019   Procedure: NAVIGATION BRONCHOSCOPY;  Surgeon: Garner Nash,  DO;  Location: [facility];  Service: Pulmonary;;  . ESOPHAGOGASTRODUODENOSCOPY (EGD) WITH PROPOFOL N/A 12/19/2016   Procedure: ESOPHAGOGASTRODUODENOSCOPY (EGD) WITH PROPOFOL;  Surgeon: Danie Binder, MD;  [facility];  Service: Endoscopy;  Laterality: N/A;  11:30am  . FLEXIBLE SIGMOIDOSCOPY N/A 12/10/2015   hemorrhoid banding X 3   . HEMORRHOID BANDING N/A 12/10/2015   Procedure: HEMORRHOID BANDING;  Surgeon: Danie Binder, MD;  [facility];  Service: Endoscopy;  Laterality: N/A;  1:30 PM  . INSERTION OF SUPRAPUBIC CATHETER N/A 02/24/2019   Procedure: INSERTION OF SUPRAPUBIC CATHETER;  Surgeon: Kathie Rhodes, MD;  Location: [facility];  Service: Urology;  Laterality: N/A;  . IR CM INJ ANY COLONIC TUBE W/FLUORO  10/30/2018  . IR GASTROSTOMY TUBE MOD SED  10/04/2018  . IR IMAGING GUIDED PORT INSERTION  10/04/2018  . IR REMOVAL TUN ACCESS W/ PORT W/O FL MOD SED  10/14/2018  . IR REPLACE G-TUBE SIMPLE WO FLUORO  11/03/2018  . IR REPLACE G-TUBE SIMPLE WO FLUORO  07/28/2019  . LAPAROSCOPIC INSERTION GASTROSTOMY TUBE Left 10/07/2018   Procedure: LAPAROSCOPIC  GASTROSTOMY TUBE;  Surgeon: Kinsinger, Arta Bruce, MD;  Location: [facility];  Service: General;  Laterality: Left;  Marland Kitchen MICROLARYNGOSCOPY N/A 09/27/2018   Procedure: MICRO DIRECT LARYNGOSCOPY WITH BIOPSY;  Surgeon: Leta Baptist, MD;  Location: [facility];  Service: ENT;  Laterality: N/A;  . None to date     As of 04/14/15  . POLYPECTOMY  Kathline Banbury 09/25/2019, 2:37 PM  [facility] [address] [place], [name], [address] Phone: [phone]   Fax:  [phone]  Name: KAMARIE PALMA MRN: [id number] Date of Birth: [birth year]  [name], [name], [address] Phone: [phone]   Fax:  [phone]  Modified Barium Swallow  Patient Details  Name: NALU TROUBLEFIELD MRN: [id number] Date of Birth: March 07, 1946 Referring Provider (SLP): Eppie Gibson, MD   Encounter Date: 09/25/2019   End of Session - 09/25/19 1432    Visit Number 1    Number of Visits 1    Authorization Type Medicare    SLP Start Time 2778    SLP Stop Time  1212    SLP Time Calculation (min) 37 min    Activity Tolerance Patient tolerated treatment well           Past Medical History:  Diagnosis Date  . Diverticulitis   . Dysrhythmia 09/2018   episode of SVT while in hospital  . 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 GERD (gastroesophageal reflux disease)   . 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 HTN (hypertension)   . Hyperlipidemia     Past Surgical History:  Procedure Laterality Date  . BIOPSY  05/04/2015   Procedure: BIOPSY;  Surgeon: Danie Binder, MD;  [facility];  Service: Endoscopy;;  bx's of ileocecal valve   . BRONCHIAL BRUSHINGS  04/29/2019   Procedure: BRONCHIAL BRUSHINGS;  Surgeon: Garner Nash, DO;  Location: [facility];  Service: Pulmonary;;  . BRONCHIAL NEEDLE ASPIRATION BIOPSY  04/29/2019   Procedure: BRONCHIAL NEEDLE ASPIRATION BIOPSIES;  Surgeon: Garner Nash, DO;  Location: [facility];  Service: Pulmonary;;  . BRONCHIAL WASHINGS  04/29/2019   Procedure: BRONCHIAL WASHINGS;  Surgeon: Garner Nash, DO;  [facility];  Service: Pulmonary;;  . COLONOSCOPY WITH PROPOFOL N/A 05/04/2015   Dr. Oneida Alar: normal appearing ileum with prominent IC valve with tubular adenomas, moderate diverticulosis in sigmoid colon, ascending colon, and retum. Moderate sized internal hemorrhoids. Surveillance in 5 years  . ELECTROMAGNETIC NAVIGATION BROCHOSCOPY  04/29/2019   Procedure: NAVIGATION BRONCHOSCOPY;  Surgeon: Garner Nash,  DO;  Location: [facility];  Service: Pulmonary;;  . ESOPHAGOGASTRODUODENOSCOPY (EGD) WITH PROPOFOL N/A 12/19/2016   Procedure: ESOPHAGOGASTRODUODENOSCOPY (EGD) WITH PROPOFOL;  Surgeon: Danie Binder, MD;  [facility];  Service: Endoscopy;  Laterality: N/A;  11:30am  . FLEXIBLE SIGMOIDOSCOPY N/A 12/10/2015   hemorrhoid banding X 3   . HEMORRHOID BANDING N/A 12/10/2015   Procedure: HEMORRHOID BANDING;  Surgeon: Danie Binder, MD;  [facility];  Service: Endoscopy;  Laterality: N/A;  1:30 PM  . INSERTION OF SUPRAPUBIC CATHETER N/A 02/24/2019   Procedure: INSERTION OF SUPRAPUBIC CATHETER;  Surgeon: Kathie Rhodes, MD;  [facility];  Service: Urology;  Laterality: N/A;  . IR CM INJ ANY COLONIC TUBE W/FLUORO  10/30/2018  . IR GASTROSTOMY TUBE MOD SED  10/04/2018  . IR IMAGING GUIDED PORT INSERTION  10/04/2018  . IR REMOVAL TUN ACCESS W/ PORT W/O FL MOD SED  10/14/2018  . IR REPLACE G-TUBE SIMPLE WO FLUORO  11/03/2018  . IR REPLACE G-TUBE SIMPLE WO FLUORO  07/28/2019  . LAPAROSCOPIC INSERTION GASTROSTOMY TUBE Left 10/07/2018   Procedure: LAPAROSCOPIC  GASTROSTOMY TUBE;  Surgeon: Kinsinger, Arta Bruce, MD;  Location: [facility];  Service: General;  Laterality: Left;  Marland Kitchen MICROLARYNGOSCOPY N/A 09/27/2018   Procedure: MICRO DIRECT LARYNGOSCOPY WITH BIOPSY;  Surgeon: Leta Baptist, MD;  Location: [facility];  Service: ENT;  Laterality: N/A;  . None to date     As of 04/14/15  . POLYPECTOMY  Kathline Banbury 09/25/2019, 2:37 PM  [facility] [address] [place], [name], [address] Phone: [phone]   Fax:  [phone]  Name: KAMARIE PALMA MRN: [id number] Date of Birth: [birth year]  [name], [name], [address] Phone: [phone]   Fax:  [phone]  Modified Barium Swallow  Patient Details  Name: NALU TROUBLEFIELD MRN: [id number] Date of Birth: March 07, 1946 Referring Provider (SLP): Eppie Gibson, MD   Encounter Date: 09/25/2019   End of Session - 09/25/19 1432    Visit Number 1    Number of Visits 1    Authorization Type Medicare    SLP Start Time 2778    SLP Stop Time  1212    SLP Time Calculation (min) 37 min    Activity Tolerance Patient tolerated treatment well           Past Medical History:  Diagnosis Date  . Diverticulitis   . Dysrhythmia 09/2018   episode of SVT while in hospital  . 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 GERD (gastroesophageal reflux disease)   . 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 HTN (hypertension)   . Hyperlipidemia     Past Surgical History:  Procedure Laterality Date  . BIOPSY  05/04/2015   Procedure: BIOPSY;  Surgeon: Danie Binder, MD;  [facility];  Service: Endoscopy;;  bx's of ileocecal valve   . BRONCHIAL BRUSHINGS  04/29/2019   Procedure: BRONCHIAL BRUSHINGS;  Surgeon: Garner Nash, DO;  Location: [facility];  Service: Pulmonary;;  . BRONCHIAL NEEDLE ASPIRATION BIOPSY  04/29/2019   Procedure: BRONCHIAL NEEDLE ASPIRATION BIOPSIES;  Surgeon: Garner Nash, DO;  Location: [facility];  Service: Pulmonary;;  . BRONCHIAL WASHINGS  04/29/2019   Procedure: BRONCHIAL WASHINGS;  Surgeon: Garner Nash, DO;  Location[facility];  Service: Pulmonary;;  . COLONOSCOPY WITH PROPOFOL N/A 05/04/2015   Dr. Oneida Alar: normal appearing ileum with prominent IC valve with tubular adenomas, moderate diverticulosis in sigmoid colon, ascending colon, and retum. Moderate sized internal hemorrhoids. Surveillance in 5 years  . ELECTROMAGNETIC NAVIGATION BROCHOSCOPY  04/29/2019   Procedure: NAVIGATION BRONCHOSCOPY;  Surgeon: Garner Nash,  DO;  Location: [facility];  Service: Pulmonary;;  . ESOPHAGOGASTRODUODENOSCOPY (EGD) WITH PROPOFOL N/A 12/19/2016   Procedure: ESOPHAGOGASTRODUODENOSCOPY (EGD) WITH PROPOFOL;  Surgeon: Danie Binder, MD;  [facility];  Service: Endoscopy;  Laterality: N/A;  11:30am  . FLEXIBLE SIGMOIDOSCOPY N/A 12/10/2015   hemorrhoid banding X 3   . HEMORRHOID BANDING N/A 12/10/2015   Procedure: HEMORRHOID BANDING;  Surgeon: Danie Binder, MD;  [facility];  Service: Endoscopy;  Laterality: N/A;  1:30 PM  . INSERTION OF SUPRAPUBIC CATHETER N/A 02/24/2019   Procedure: INSERTION OF SUPRAPUBIC CATHETER;  Surgeon: Kathie Rhodes, MD;  [facility];  Service: Urology;  Laterality: N/A;  . IR CM INJ ANY COLONIC TUBE W/FLUORO  10/30/2018  . IR GASTROSTOMY TUBE MOD SED  10/04/2018  . IR IMAGING GUIDED PORT INSERTION  10/04/2018  . IR REMOVAL TUN ACCESS W/ PORT W/O FL MOD SED  10/14/2018  . IR REPLACE G-TUBE SIMPLE WO FLUORO  11/03/2018  . IR REPLACE G-TUBE SIMPLE WO FLUORO  07/28/2019  . LAPAROSCOPIC INSERTION GASTROSTOMY TUBE Left 10/07/2018   Procedure: LAPAROSCOPIC  GASTROSTOMY TUBE;  Surgeon: Kinsinger, Arta Bruce, MD;  Location: [facility];  Service: General;  Laterality: Left;  Marland Kitchen MICROLARYNGOSCOPY N/A 09/27/2018   Procedure: MICRO DIRECT LARYNGOSCOPY WITH BIOPSY;  Surgeon: Leta Baptist, MD;  Location: [facility];  Service: ENT;  Laterality: N/A;  . None to date     As of 04/14/15  . POLYPECTOMY

## 2019-09-26 ENCOUNTER — Other Ambulatory Visit: Payer: Self-pay

## 2019-09-26 DIAGNOSIS — C320 Malignant neoplasm of glottis: Secondary | ICD-10-CM

## 2019-09-26 NOTE — Progress Notes (Signed)
Oncology Nurse Navigator Documentation  I spoke with Logan Price regarding his MBSS results and informed him that Dr. Isidore Moos has placed an order to have his feeding tube removed. I informed him that he should receive a phone call to get that scheduled. He voiced his appreciation and knows to call me if he has any future questions/concerns.  Harlow Asa RN, BSN, OCN Head [facility] Neck Oncology Nurse [place] [name] Phone # [phone]  Fax # [phone]

## 2019-09-30 ENCOUNTER — Encounter (HOSPITAL_COMMUNITY): Payer: Self-pay | Admitting: Physical Therapy

## 2019-09-30 ENCOUNTER — Ambulatory Visit (HOSPITAL_COMMUNITY): Payer: Medicare Other | Admitting: Physical Therapy

## 2019-09-30 ENCOUNTER — Other Ambulatory Visit: Payer: Self-pay

## 2019-09-30 DIAGNOSIS — R2689 Other abnormalities of gait and mobility: Secondary | ICD-10-CM

## 2019-09-30 DIAGNOSIS — M6281 Muscle weakness (generalized): Secondary | ICD-10-CM

## 2019-09-30 DIAGNOSIS — R1312 Dysphagia, oropharyngeal phase: Secondary | ICD-10-CM | POA: Diagnosis not present

## 2019-09-30 NOTE — Therapy (Signed)
strength, balance, gait, and functional mobility. Patient remains limited with dynamic balance. Patient educated on progress made and returning to physical therapy if needed. Patient discharged from PT at this time.    Personal Factors and Comorbidities Age;Fitness;Comorbidity 2    Comorbidities radiation history, unknown cancer status    Examination-Activity Limitations Carry    Examination-Participation Restrictions Yard Work;Community Activity    Stability/Clinical Decision Making Stable/Uncomplicated    Rehab Potential Good     PT Frequency --    PT Duration --    PT Treatment/Interventions ADLs/Self Care Home Management;Gait training;Therapeutic exercise;Therapeutic activities;Neuromuscular re-education;Patient/family education;Manual techniques    PT Next Visit Plan n/a    PT Home Exercise Plan Access Code: [email or web address] given medbridge handout on seated hamstring stretch, supine LTR, and TKE/quad sets in supine; 5/13: scapular retraction bridging, decompression ex and wall arch           Patient will benefit from skilled therapeutic intervention in order to improve the following deficits and impairments:  Pain, Decreased activity tolerance, Difficulty walking, Decreased balance  Visit Diagnosis: Muscle weakness (generalized)  Other abnormalities of gait and mobility     Problem List Patient Active Problem List   Diagnosis Date Noted  . Malignant neoplasm of right upper lobe of lung ([place]) 05/27/2019  . Primary cancer of left upper lobe of lung ([place]) 05/27/2019  . Pseudomonas aeruginosa infection 05/21/2019  . Tracheobronchitis 05/21/2019  . Hx of radiation therapy 05/21/2019  . 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[place]) 05/21/2019  . Squamous cell lung cancer, right ([place]) 05/21/2019  . Adenocarcinoma, lung, left ([place]) 05/21/2019  . Lung nodule 04/23/2019  . Ground glass opacity present on imaging of lung 04/23/2019  . Head and neck cancer ([place]) 04/23/2019  . Tracheostomy tube present ([place]) 04/23/2019  . PEG (percutaneous endoscopic gastrostomy) adjustment/replacement/removal ([place]) 03/08/2019  . 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 Pleural effusion   . 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[place])   . Bacteremia   . Leukocytosis   . Normocytic anemia   . Protein-calorie malnutrition, severe 10/08/2018  . PSVT (paroxysmal supraventricular tachycardia) ([place]) 10/03/2018  . Atrial fibrillation ([place]) 10/03/2018  . Goals of care, counseling/discussion   . Dysphagia   . Loss of weight   . 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 Gastritis determined by endoscopy   . Esophageal dysphagia   . 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 Globus sensation 12/12/2016  . Smokes with greater than 40 pack year history 10/20/2016  . Hemorrhoids, internal, with bleeding 10/06/2015  . History of colonic polyps 04/14/2015  . Vitamin D deficiency 10/09/2014  . HLD (hyperlipidemia) 10/08/2012  . GERD (gastroesophageal reflux disease) 10/08/2012  . HTN (hypertension)   . Diverticulitis 10/25/2009   3:08 PM, 09/30/19 Mearl Latin PT, DPT Physical Therapist at [place], [name], [address] Phone: [phone]   Fax:  [phone]  Name: Logan Price MRN: [id number] Date of Birth: [birth year]  strength, balance, gait, and functional mobility. Patient remains limited with dynamic balance. Patient educated on progress made and returning to physical therapy if needed. Patient discharged from PT at this time.    Personal Factors and Comorbidities Age;Fitness;Comorbidity 2    Comorbidities radiation history, unknown cancer status    Examination-Activity Limitations Carry    Examination-Participation Restrictions Yard Work;Community Activity    Stability/Clinical Decision Making Stable/Uncomplicated    Rehab Potential Good     PT Frequency --    PT Duration --    PT Treatment/Interventions ADLs/Self Care Home Management;Gait training;Therapeutic exercise;Therapeutic activities;Neuromuscular re-education;Patient/family education;Manual techniques    PT Next Visit Plan n/a    PT Home Exercise Plan Access Code: [email or web address] given medbridge handout on seated hamstring stretch, supine LTR, and TKE/quad sets in supine; 5/13: scapular retraction bridging, decompression ex and wall arch           Patient will benefit from skilled therapeutic intervention in order to improve the following deficits and impairments:  Pain, Decreased activity tolerance, Difficulty walking, Decreased balance  Visit Diagnosis: Muscle weakness (generalized)  Other abnormalities of gait and mobility     Problem List Patient Active Problem List   Diagnosis Date Noted  . Malignant neoplasm of right upper lobe of lung ([place]) 05/27/2019  . Primary cancer of left upper lobe of lung ([place]) 05/27/2019  . Pseudomonas aeruginosa infection 05/21/2019  . Tracheobronchitis 05/21/2019  . Hx of radiation therapy 05/21/2019  . 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[place]) 05/21/2019  . Squamous cell lung cancer, right ([place]) 05/21/2019  . Adenocarcinoma, lung, left ([place]) 05/21/2019  . Lung nodule 04/23/2019  . Ground glass opacity present on imaging of lung 04/23/2019  . Head and neck cancer ([place]) 04/23/2019  . Tracheostomy tube present ([place]) 04/23/2019  . PEG (percutaneous endoscopic gastrostomy) adjustment/replacement/removal ([place]) 03/08/2019  . 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 Pleural effusion   . 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[place])   . Bacteremia   . Leukocytosis   . Normocytic anemia   . Protein-calorie malnutrition, severe 10/08/2018  . PSVT (paroxysmal supraventricular tachycardia) ([place]) 10/03/2018  . Atrial fibrillation ([place]) 10/03/2018  . Goals of care, counseling/discussion   . Dysphagia   . Loss of weight   . 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 Gastritis determined by endoscopy   . Esophageal dysphagia   . 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 Globus sensation 12/12/2016  . Smokes with greater than 40 pack year history 10/20/2016  . Hemorrhoids, internal, with bleeding 10/06/2015  . History of colonic polyps 04/14/2015  . Vitamin D deficiency 10/09/2014  . HLD (hyperlipidemia) 10/08/2012  . GERD (gastroesophageal reflux disease) 10/08/2012  . HTN (hypertension)   . Diverticulitis 10/25/2009   3:08 PM, 09/30/19 Mearl Latin PT, DPT Physical Therapist at [place], [name], [address] Phone: [phone]   Fax:  [phone]  Name: Logan Price MRN: [id number] Date of Birth: [birth year]  [place], [name], [address] Phone: [phone]   Fax:  [phone]  Physical Therapy Treatment/discharge Summary  Patient Details  Name: Logan Price MRN: [id number] Date of Birth: 07-26-1945 Referring Provider (PT): Dr. Isidore Moos   Encounter Date: 09/30/2019  PHYSICAL THERAPY DISCHARGE SUMMARY  Visits from Start of Care: 18  Current functional level related to goals / functional outcomes: See below   Remaining deficits: See below   Education / Equipment: See below  Plan: Patient agrees to discharge.  Patient goals were partially met. Patient is being discharged due to meeting the stated rehab goals.  ?????        PT End of Session - 09/30/19 1453    Visit Number 18    Number of Visits 21    Date for PT Re-Evaluation 09/29/19    PT Start Time 1435    PT Stop Time 1500    PT Time Calculation (min) 25 min    Activity Tolerance Patient tolerated treatment well    Behavior During Therapy [facility] for tasks assessed/performed           Past Medical History:  Diagnosis Date  . Diverticulitis   . Dysrhythmia 09/2018   episode of SVT while in hospital  . 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 GERD (gastroesophageal reflux disease)   . 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 HTN (hypertension)   . Hyperlipidemia     Past Surgical History:  Procedure Laterality Date  . BIOPSY  05/04/2015   Procedure: BIOPSY;  Surgeon: Danie Binder, MD;  [facility];  Service: Endoscopy;;  bx's of ileocecal valve   . BRONCHIAL BRUSHINGS  04/29/2019   Procedure: BRONCHIAL BRUSHINGS;  Surgeon: Garner Nash, DO;  Location: [facility];  Service: Pulmonary;;  . BRONCHIAL NEEDLE ASPIRATION BIOPSY  04/29/2019   Procedure: BRONCHIAL NEEDLE ASPIRATION BIOPSIES;  Surgeon: Garner Nash, DO;  Location: [facility];  Service: Pulmonary;;  . BRONCHIAL WASHINGS  04/29/2019   Procedure: BRONCHIAL  WASHINGS;  Surgeon: Garner Nash, DO;  [facility];  Service: Pulmonary;;  . COLONOSCOPY WITH PROPOFOL N/A 05/04/2015   Dr. Oneida Alar: normal appearing ileum with prominent IC valve with tubular adenomas, moderate diverticulosis in sigmoid colon, ascending colon, and retum. Moderate sized internal hemorrhoids. Surveillance in 5 years  . ELECTROMAGNETIC NAVIGATION BROCHOSCOPY  04/29/2019   Procedure: NAVIGATION BRONCHOSCOPY;  Surgeon: Garner Nash, DO;  Location: [facility];  Service: Pulmonary;;  . ESOPHAGOGASTRODUODENOSCOPY (EGD) WITH PROPOFOL N/A 12/19/2016   Procedure: ESOPHAGOGASTRODUODENOSCOPY (EGD) WITH PROPOFOL;  Surgeon: Danie Binder, MD;  [facility];  Service: Endoscopy;  Laterality: N/A;  11:30am  . FLEXIBLE SIGMOIDOSCOPY N/A 12/10/2015   hemorrhoid banding X 3   . HEMORRHOID BANDING N/A 12/10/2015   Procedure: HEMORRHOID BANDING;  Surgeon: Danie Binder, MD;  [facility];  Service: Endoscopy;  Laterality: N/A;  1:30 PM  . INSERTION OF SUPRAPUBIC CATHETER N/A 02/24/2019   Procedure: INSERTION OF SUPRAPUBIC CATHETER;  Surgeon: Kathie Rhodes, MD;  Location: [facility];  Service: Urology;  Laterality: N/A;  . IR CM INJ ANY COLONIC TUBE W/FLUORO  10/30/2018  . IR GASTROSTOMY TUBE MOD SED  10/04/2018  . IR IMAGING GUIDED PORT INSERTION  10/04/2018  . IR REMOVAL TUN ACCESS W/ PORT W/O FL MOD SED  10/14/2018  . IR REPLACE G-TUBE SIMPLE WO FLUORO  11/03/2018  . IR REPLACE G-TUBE  [place], [name], [address] Phone: [phone]   Fax:  [phone]  Physical Therapy Treatment/discharge Summary  Patient Details  Name: Logan Price MRN: [id number] Date of Birth: 07-26-1945 Referring Provider (PT): Dr. Isidore Moos   Encounter Date: 09/30/2019  PHYSICAL THERAPY DISCHARGE SUMMARY  Visits from Start of Care: 18  Current functional level related to goals / functional outcomes: See below   Remaining deficits: See below   Education / Equipment: See below  Plan: Patient agrees to discharge.  Patient goals were partially met. Patient is being discharged due to meeting the stated rehab goals.  ?????        PT End of Session - 09/30/19 1453    Visit Number 18    Number of Visits 21    Date for PT Re-Evaluation 09/29/19    PT Start Time 1435    PT Stop Time 1500    PT Time Calculation (min) 25 min    Activity Tolerance Patient tolerated treatment well    Behavior During Therapy [facility] for tasks assessed/performed           Past Medical History:  Diagnosis Date  . Diverticulitis   . Dysrhythmia 09/2018   episode of SVT while in hospital  . 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 GERD (gastroesophageal reflux disease)   . 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 HTN (hypertension)   . Hyperlipidemia     Past Surgical History:  Procedure Laterality Date  . BIOPSY  05/04/2015   Procedure: BIOPSY;  Surgeon: Danie Binder, MD;  [facility];  Service: Endoscopy;;  bx's of ileocecal valve   . BRONCHIAL BRUSHINGS  04/29/2019   Procedure: BRONCHIAL BRUSHINGS;  Surgeon: Garner Nash, DO;  Location: [facility];  Service: Pulmonary;;  . BRONCHIAL NEEDLE ASPIRATION BIOPSY  04/29/2019   Procedure: BRONCHIAL NEEDLE ASPIRATION BIOPSIES;  Surgeon: Garner Nash, DO;  Location: [facility] [place];  Service: Pulmonary;;  . BRONCHIAL WASHINGS  04/29/2019   Procedure: BRONCHIAL  WASHINGS;  Surgeon: Garner Nash, DO;  [facility];  Service: Pulmonary;;  . COLONOSCOPY WITH PROPOFOL N/A 05/04/2015   Dr. Oneida Alar: normal appearing ileum with prominent IC valve with tubular adenomas, moderate diverticulosis in sigmoid colon, ascending colon, and retum. Moderate sized internal hemorrhoids. Surveillance in 5 years  . ELECTROMAGNETIC NAVIGATION BROCHOSCOPY  04/29/2019   Procedure: NAVIGATION BRONCHOSCOPY;  Surgeon: Garner Nash, DO;  Location: [facility];  Service: Pulmonary;;  . ESOPHAGOGASTRODUODENOSCOPY (EGD) WITH PROPOFOL N/A 12/19/2016   Procedure: ESOPHAGOGASTRODUODENOSCOPY (EGD) WITH PROPOFOL;  Surgeon: Danie Binder, MD;  [facility];  Service: Endoscopy;  Laterality: N/A;  11:30am  . FLEXIBLE SIGMOIDOSCOPY N/A 12/10/2015   hemorrhoid banding X 3   . HEMORRHOID BANDING N/A 12/10/2015   Procedure: HEMORRHOID BANDING;  Surgeon: Danie Binder, MD;  [facility];  Service: Endoscopy;  Laterality: N/A;  1:30 PM  . INSERTION OF SUPRAPUBIC CATHETER N/A 02/24/2019   Procedure: INSERTION OF SUPRAPUBIC CATHETER;  Surgeon: Kathie Rhodes, MD;  [facility];  Service: Urology;  Laterality: N/A;  . IR CM INJ ANY COLONIC TUBE W/FLUORO  10/30/2018  . IR GASTROSTOMY TUBE MOD SED  10/04/2018  . IR IMAGING GUIDED PORT INSERTION  10/04/2018  . IR REMOVAL TUN ACCESS W/ PORT W/O FL MOD SED  10/14/2018  . IR REPLACE G-TUBE SIMPLE WO FLUORO  11/03/2018  . IR REPLACE G-TUBE

## 2019-10-02 ENCOUNTER — Encounter (HOSPITAL_COMMUNITY): Payer: PRIVATE HEALTH INSURANCE | Admitting: Physical Therapy

## 2019-10-07 ENCOUNTER — Encounter: Payer: Self-pay | Admitting: Family

## 2019-10-07 ENCOUNTER — Other Ambulatory Visit: Payer: Self-pay

## 2019-10-07 ENCOUNTER — Ambulatory Visit (INDEPENDENT_AMBULATORY_CARE_PROVIDER_SITE_OTHER): Payer: Medicare Other | Admitting: Family

## 2019-10-07 ENCOUNTER — Ambulatory Visit (INDEPENDENT_AMBULATORY_CARE_PROVIDER_SITE_OTHER): Payer: Medicare Other

## 2019-10-07 ENCOUNTER — Other Ambulatory Visit: Payer: Self-pay | Admitting: Family

## 2019-10-07 VITALS — BP 112/70 | HR 79 | Temp 97.0°F | Ht 69.0 in | Wt 152.0 lb

## 2019-10-07 DIAGNOSIS — C760 Malignant neoplasm of head, face and neck: Secondary | ICD-10-CM | POA: Diagnosis not present

## 2019-10-07 DIAGNOSIS — C329 Malignant neoplasm of larynx, unspecified: Secondary | ICD-10-CM

## 2019-10-07 DIAGNOSIS — M47816 Spondylosis without myelopathy or radiculopathy, lumbar region: Secondary | ICD-10-CM | POA: Diagnosis not present

## 2019-10-07 DIAGNOSIS — M5441 Lumbago with sciatica, right side: Secondary | ICD-10-CM | POA: Diagnosis not present

## 2019-10-07 DIAGNOSIS — Z923 Personal history of irradiation: Secondary | ICD-10-CM | POA: Diagnosis not present

## 2019-10-07 DIAGNOSIS — G8929 Other chronic pain: Secondary | ICD-10-CM

## 2019-10-07 DIAGNOSIS — M5442 Lumbago with sciatica, left side: Secondary | ICD-10-CM

## 2019-10-07 DIAGNOSIS — I10 Essential (primary) hypertension: Secondary | ICD-10-CM

## 2019-10-07 DIAGNOSIS — E785 Hyperlipidemia, unspecified: Secondary | ICD-10-CM

## 2019-10-07 LAB — LIPID PANEL
Chol/HDL Ratio: 2.7 ratio (ref 0.0–5.0)
Cholesterol, Total: 136 mg/dL (ref 100–199)
HDL: 51 mg/dL (ref >39)
LDL Chol Calc (NIH): 73 mg/dL (ref 0–99)
Triglycerides: 58 mg/dL (ref 0–149)
VLDL Cholesterol Cal: 12 mg/dL (ref 5–40)

## 2019-10-07 MED ORDER — DICLOFENAC SODIUM 75 MG PO TBEC: 75.0000 mg | DELAYED_RELEASE_TABLET | Freq: Two times a day (BID) | ORAL | 1 refills | Status: DC

## 2019-10-07 NOTE — Patient Instructions (Signed)

## 2019-10-07 NOTE — Progress Notes (Signed)
Subjective:    Patient ID: Logan Price, male    DOB: 08-12-1945, 75 y.o.   MRN: [id number]  Chief Complaint  Patient presents with  . Medical Management of Chronic Issues  . Hypertension  . Hyperlipidemia  . Back Pain    lower. Radiates down both legs   Pt presents to the office today for chronic follow up. 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  He is followed by ENT and had his trach reversal in March.  He is currently on a regular diet.    He still has his PEG tube, but has it scheduled to be removed tomorrow.  Hypertension This is a chronic problem. The current episode started more than 1 year ago. The problem has been resolved since onset. The problem is controlled. Pertinent negatives include no malaise/fatigue, peripheral edema or shortness of breath. Risk factors for coronary artery disease include dyslipidemia, obesity, male gender and sedentary lifestyle. The current treatment provides moderate improvement.  Hyperlipidemia This is a chronic problem. The current episode started more than 1 year ago. The problem is controlled. Recent lipid tests were reviewed and are normal. Pertinent negatives include no shortness of breath. Current antihyperlipidemic treatment includes statins. The current treatment provides moderate improvement of lipids. Risk factors for coronary artery disease include dyslipidemia, male sex, hypertension and a sedentary lifestyle.  Back Pain This is a chronic problem. The current episode started more than 1 year ago. The problem occurs constantly. The problem has been waxing and waning since onset. The pain is present in the lumbar spine. The quality of the pain is described as aching. The pain radiates to the left thigh and right thigh. The pain is at a severity of 8/10. The pain is moderate. The symptoms are aggravated by standing. Associated symptoms include numbness and tingling. He has tried nothing  for the symptoms. The treatment provided no relief.  Gastroesophageal Reflux He reports no belching or no heartburn. This is a chronic problem. The current episode started more than 1 year ago. The problem occurs occasionally. The problem has been waxing and waning. The symptoms are aggravated by certain foods. He has tried a PPI for the symptoms. The treatment provided moderate relief.      Review of Systems  Constitutional: Negative for malaise/fatigue.  Respiratory: Negative for shortness of breath.   Gastrointestinal: Negative for heartburn.  Musculoskeletal: Positive for back pain.  Neurological: Positive for tingling and numbness.  All other systems reviewed and are negative.      Objective:   Physical Exam Vitals reviewed.  Constitutional:      General: He is not in acute distress.    Appearance: He is well-developed.  HENT:     Head: Normocephalic.     Right Ear: Tympanic membrane normal.     Left Ear: Tympanic membrane normal.  Eyes:     General:        Right eye: No discharge.        Left eye: No discharge.     Pupils: Pupils are equal, round, and reactive to light.  Neck:     Thyroid: No thyromegaly.     Comments: Janina Mayo scare present Cardiovascular:     Rate and Rhythm: Normal rate and regular rhythm.     Heart sounds: Normal heart sounds. No murmur heard.   Pulmonary:     Effort: Pulmonary effort is normal. No respiratory distress.     Breath sounds: Normal  breath sounds. No wheezing.  Abdominal:     General: Bowel sounds are normal. There is no distension.     Palpations: Abdomen is soft.     Tenderness: There is no abdominal tenderness.  Musculoskeletal:        General: No tenderness.     Cervical back: Normal range of motion and neck supple.     Comments: Pain in lumbar with flexion and extension  Skin:    General: Skin is warm and dry.     Findings: No erythema or rash.  Neurological:     Mental Status: He is alert and oriented to person, place,  and time.     Cranial Nerves: No cranial nerve deficit.     Deep Tendon Reflexes: Reflexes are normal and symmetric.  Psychiatric:        Behavior: Behavior normal.        Thought Content: Thought content normal.        Judgment: Judgment normal.       BP 112/70   Pulse 79   Temp (!) 97 F (36.1 C)   Ht 5\' 9"  (1.753 m)   Wt 152 lb (68.9 kg)   SpO2 97%   BMI 22.45 kg/m      Assessment & Plan:  Logan Price comes in today with chief complaint of Medical Management of Chronic Issues, Hypertension, Hyperlipidemia, and Back Pain (lower. Radiates down both legs)   Diagnosis and orders addressed:  1. Essential hypertension  2. 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HCC)  3. Head and neck cancer (HCC)  4. Hx of radiation therapy  5. Hyperlipidemia, unspecified hyperlipidemia type - Lipid panel  6. Chronic bilateral low back pain with bilateral sciatica - DG Lumbar Spine 2-3 Views; Future - diclofenac (VOLTAREN) 75 MG EC tablet; Take 1 tablet (75 mg total) by mouth 2 (two) times daily.  Dispense: 60 tablet; Refill: 1   Labs reviewed and Lipid pending  Health Maintenance reviewed Diet and exercise encouraged  Follow up plan: 6 months    [name], FNP

## 2019-10-08 ENCOUNTER — Ambulatory Visit (HOSPITAL_COMMUNITY)
Admission: RE | Admit: 2019-10-08 | Discharge: 2019-10-08 | Disposition: A | Discharge status: Home / Self Care | Payer: Medicare Other | Source: Ambulatory Visit | Attending: Radiation Oncology | Admitting: Radiation Oncology

## 2019-10-08 DIAGNOSIS — C320 Malignant neoplasm of glottis: Secondary | ICD-10-CM | POA: Diagnosis not present

## 2019-10-08 DIAGNOSIS — Z431 Encounter for attention to gastrostomy: Secondary | ICD-10-CM | POA: Insufficient documentation

## 2019-10-08 HISTORY — PX: IR GASTROSTOMY TUBE REMOVAL: IMG5492

## 2019-10-08 NOTE — Procedures (Signed)
Per order of Dr. Isidore Moos, pt's 59 F balloon retention gastrostomy tube was removed in its entirety without immediate complications. Gauze dressing applied over site. EBL none. Site care instructions were reviewed with pt.

## 2019-10-10 ENCOUNTER — Ambulatory Visit (INDEPENDENT_AMBULATORY_CARE_PROVIDER_SITE_OTHER): Payer: Medicare Other | Admitting: Licensed Clinical Social Worker

## 2019-10-10 DIAGNOSIS — I10 Essential (primary) hypertension: Secondary | ICD-10-CM

## 2019-10-10 DIAGNOSIS — E785 Hyperlipidemia, unspecified: Secondary | ICD-10-CM | POA: Diagnosis not present

## 2019-10-10 DIAGNOSIS — K219 Gastro-esophageal reflux disease without esophagitis: Secondary | ICD-10-CM

## 2019-10-10 DIAGNOSIS — E559 Vitamin D deficiency, unspecified: Secondary | ICD-10-CM

## 2019-10-10 NOTE — Chronic Care Management (AMB) (Addendum)
Chronic Care Management    Clinical Social Work Follow Up Note  10/10/2019 Name: Logan Price MRN: [id number] DOB: 1945/05/14  Logan Price is a 74 y.o. year old male who is a primary care patient of Logan Spencer, FNP. The CCM team was consulted for assistance with Logan Price .   Review of patient status, including review of consultants reports, other relevant assessments, and collaboration with appropriate care team members and the patient's provider was performed as part of comprehensive patient evaluation and provision of chronic care management services.    SDOH (Social Determinants of Health) assessments performed: No;risk for tobacco use; risk for depression; risk for stress    Chronic Care Management from 09/08/2019 in [facility]  PHQ-9 Total Score 2       GAD 7 : Generalized Anxiety Score 09/08/2019  Nervous, Anxious, on Edge 0  Control/stop worrying 0  Worry too much - different things 0  Trouble relaxing 0  Restless 0  Easily annoyed or irritable 0  Afraid - awful might happen 0  Total GAD 7 Score 0  Anxiety Difficulty Somewhat difficult     Outpatient Encounter Medications as of 10/10/2019  Medication Sig   simvastatin (ZOCOR) 20 MG tablet TAKE 1 TABLET DAILY AT 6PM   diclofenac (VOLTAREN) 75 MG EC tablet Take 1 tablet (75 mg total) by mouth 2 (two) times daily.   doxazosin (CARDURA) 1 MG tablet Place 1 tablet (1 mg total) into feeding tube daily. (Patient taking differently: Take 1 mg by mouth daily. )   pantoprazole (PROTONIX) 40 MG tablet TAKE ONE TABLET DAILY 30 MINUTES PRIOR TO THE FIRST MEAL   No facility-administered encounter medications on file as of 10/10/2019.    Goals       Client will talk with LCSW in next 30 days to discuss ADLs completion for client (pt-stated)      CARE PLAN ENTRY   Current Barriers:  Atrial Fibrillation in client with Chronic Diagnoses of Adenocarcinoma of left lung, Vitamin D deficiency,  GERD, HTN, HLD  Clinical Social Work Clinical Goal(s):  LCSW will call client in next 30 days to discuss client completion of ADLs  Interventions:   Talked with client about pain issues in his lower back and legs Talked with client about mobility issues Talked with client about food intake of client (had PEG tube removed recently) Talked with client about transport needs of client Talked with client about social support network (has support from his spouse and from his brother) Talked with client about ADLs completion of client Talked with client about upcoming medical appointments  Talked with client about physical therapy sessions received by client Talked with Logan Price about his skin condition at present Encouraged Logan Price to talk with RNCM as needed regarding nursing needs of client Talked with Logan Price about his history of treatments for cancer Talked with Logan Price about ambulation needs (he uses walker as needed for ambulation assistance) Talked with Logan Price about relaxation techniques (watches TV) Talked with Logan Price about support from his brother  Patient Self Care Activities:   Attends scheduled medical appointments Drives to medical appointments and drives to complete errands needed  Patient Self Care Deficits:  Mobility issues Pain issues  Initial goal documentation    Follow Up Plan:  LCSW to call client in next 4 weeks to talk with client about client completion of daily ADLs  Logan Price.Logan Price MSW, LCSW Licensed Clinical Social Worker [facility] Care Management [phone]  I have reviewed the CCM documentation and agree with the written assessment and plan of care.  Logan Rodney, FNP

## 2019-10-10 NOTE — Patient Instructions (Addendum)
Licensed Clinical Education officer, museum Visit Information  Goals we discussed today:      Client will talk with LCSW in next 30 days to discuss ADLs completion for client (pt-stated)        CARE PLAN ENTRY   Current Barriers:   Atrial Fibrillation in client with Chronic Diagnoses of Adenocarcinoma of left lung, Vitamin D deficiency, GERD, HTN, HLD  Clinical Social Work Clinical Goal(s):   LCSW will call client in next 30 days to discuss client completion of ADLs  Interventions:    Talked with client about pain issues in his lower back and legs  Talked with client about mobility issues  Talked with client about food intake of client (had PEG tube removed recently)  Talked with client about transport needs of client  Talked with client about social support network (has support from his spouse and from his brother)  Talked with client about ADLs completion of client  Talked with client about upcoming medical appointments   Talked with client about physical therapy sessions received by client  Talked with Felicie Morn about his skin condition at present  Encouraged Matthewjames to talk with RNCM as needed regarding nursing needs of client  Talked with Antoino about his history of treatments for cancer  Talked with Leelan about ambulation needs (he uses walker as needed for ambulation assistance)  Talked with Josyah about relaxation techniques (watches TV)  Talked with Jarrel about support from his brother  Patient Self Care Activities:   Attends scheduled medical appointments Drives to medical appointments and drives to complete errands needed  Patient Self Care Deficits:  Mobility issues Pain issues  Initial goal documentation    Materials Provided: No  Follow Up Plan: LCSW to call client in next 4 weeks to talk with client about client completion of daily ADLs  The patient verbalized understanding of instructions provided today and declined a print copy of patient  instruction materials.   Norva Riffle.Martavion Couper MSW, LCSW Licensed Clinical Social Worker [facility] [phone]

## 2019-10-21 ENCOUNTER — Telehealth: Payer: Self-pay | Admitting: Family

## 2019-10-21 DIAGNOSIS — G8929 Other chronic pain: Secondary | ICD-10-CM

## 2019-10-21 NOTE — Telephone Encounter (Signed)
Pt made aware

## 2019-10-21 NOTE — Telephone Encounter (Signed)
PT ordered.

## 2019-10-21 NOTE — Telephone Encounter (Signed)
Pts wife states that the diclofenac (VOLTAREN) 75 MG EC tablet  Is not helping her husbands back pain and she would like to speak to Bowie regarding.

## 2019-10-21 NOTE — Telephone Encounter (Signed)
Pt continues to have lower back pain with Voltaren BID. Pain is worse after sitting for long periods of time. Pt states that he is willing to try PT.

## 2019-10-22 ENCOUNTER — Telehealth: Payer: Self-pay | Admitting: Family

## 2019-10-27 ENCOUNTER — Telehealth: Payer: Self-pay | Admitting: Nutrition

## 2019-10-28 ENCOUNTER — Ambulatory Visit (HOSPITAL_COMMUNITY): Payer: Medicare Other | Admitting: Physical Therapy

## 2019-10-28 ENCOUNTER — Inpatient Hospital Stay: Payer: Medicare Other | Attending: Hematology

## 2019-10-28 ENCOUNTER — Encounter (HOSPITAL_COMMUNITY): Payer: Self-pay

## 2019-10-28 NOTE — Progress Notes (Signed)
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 Patient has completed radiation for bilateral lung nodules. PEG removed on July 14/2021.    Spoke with patient via phone for nutrition follow-up.  Patient eating well.  Reports that yesterday ate hot dog for breakfast.  Lunch was ham and cheese cold plate with potato salad.  Dinner last night was barbecue, mashed potatoes and corn.  Drinking ensure/boost BID.  Denies trouble chewing or swallowing.     Medications: reviewed  Labs: reviewed  Anthropometrics:   Weight 152 lb on 7/13 stable   NUTRITION DIAGNOSIS: Inadequate oral intake improved   INTERVENTION:  Patient to continue to eat well-balanced diet, including good sources of protein.   Continue oral nutrition supplements BID. Patient has RD contact information and will reach out to RD if something changes in nutritional status.    NEXT VISIT: no follow-up.  Patient to call RD if needed  Logan Price B. Zenia Resides, [place], [place] Registered Dietitian [phone] (mobile)

## 2019-11-03 ENCOUNTER — Ambulatory Visit (HOSPITAL_COMMUNITY): Payer: Medicare Other | Admitting: Physical Therapy

## 2019-11-06 ENCOUNTER — Encounter (HOSPITAL_COMMUNITY): Payer: Self-pay | Admitting: Physical Therapy

## 2019-11-06 ENCOUNTER — Ambulatory Visit (HOSPITAL_COMMUNITY): Payer: Medicare Other | Attending: Family | Admitting: Physical Therapy

## 2019-11-06 ENCOUNTER — Other Ambulatory Visit: Payer: Self-pay

## 2019-11-06 DIAGNOSIS — G8929 Other chronic pain: Secondary | ICD-10-CM | POA: Insufficient documentation

## 2019-11-06 DIAGNOSIS — R2689 Other abnormalities of gait and mobility: Secondary | ICD-10-CM | POA: Diagnosis not present

## 2019-11-06 DIAGNOSIS — I890 Lymphedema, not elsewhere classified: Secondary | ICD-10-CM | POA: Diagnosis not present

## 2019-11-06 DIAGNOSIS — M6281 Muscle weakness (generalized): Secondary | ICD-10-CM | POA: Diagnosis not present

## 2019-11-06 DIAGNOSIS — M545 Low back pain: Secondary | ICD-10-CM | POA: Insufficient documentation

## 2019-11-06 NOTE — Therapy (Signed)
[facility] [address] [place], [name], [address] Phone: [phone]   Fax:  [phone]  Physical Therapy Evaluation  Patient Details  Name: Logan Price MRN: [id number] Date of Birth: 07-Feb-1946 Referring Provider (PT): Evelina Dun FNP    Encounter Date: 11/06/2019   PT End of Session - 11/06/19 0937    Visit Number 1    Number of Visits 8    Date for PT Re-Evaluation 12/05/19    Authorization Type Medicare/ Generic commercial secondary    PT Start Time 0900    PT Stop Time 0945    PT Time Calculation (min) 45 min    Equipment Utilized During Treatment Gait belt    Activity Tolerance Patient tolerated treatment well    Behavior During Therapy [facility] for tasks assessed/performed           Past Medical History:  Diagnosis Date  . Diverticulitis   . Dysrhythmia 09/2018   episode of SVT while in hospital  . 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 GERD (gastroesophageal reflux disease)   . 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 HTN (hypertension)   . Hyperlipidemia     Past Surgical History:  Procedure Laterality Date  . BIOPSY  05/04/2015   Procedure: BIOPSY;  Surgeon: Danie Binder, MD;  [facility];  Service: Endoscopy;;  bx's of ileocecal valve   . BRONCHIAL BRUSHINGS  04/29/2019   Procedure: BRONCHIAL BRUSHINGS;  Surgeon: Garner Nash, DO;  Location: [facility];  Service: Pulmonary;;  . BRONCHIAL NEEDLE ASPIRATION BIOPSY  04/29/2019   Procedure: BRONCHIAL NEEDLE ASPIRATION BIOPSIES;  Surgeon: Garner Nash, DO;  Location: [facility];  Service: Pulmonary;;  . BRONCHIAL WASHINGS  04/29/2019   Procedure: BRONCHIAL WASHINGS;  Surgeon: Garner Nash, DO;  [facility];  Service: Pulmonary;;  . COLONOSCOPY WITH PROPOFOL N/A 05/04/2015   Dr. Oneida Alar: normal appearing ileum with prominent IC valve with tubular adenomas, moderate diverticulosis in sigmoid colon, ascending colon, and  retum. Moderate sized internal hemorrhoids. Surveillance in 5 years  . ELECTROMAGNETIC NAVIGATION BROCHOSCOPY  04/29/2019   Procedure: NAVIGATION BRONCHOSCOPY;  Surgeon: Garner Nash, DO;  Location: [facility];  Service: Pulmonary;;  . ESOPHAGOGASTRODUODENOSCOPY (EGD) WITH PROPOFOL N/A 12/19/2016   Procedure: ESOPHAGOGASTRODUODENOSCOPY (EGD) WITH PROPOFOL;  Surgeon: Danie Binder, MD;  [facility];  Service: Endoscopy;  Laterality: N/A;  11:30am  . FLEXIBLE SIGMOIDOSCOPY N/A 12/10/2015   hemorrhoid banding X 3   . HEMORRHOID BANDING N/A 12/10/2015   Procedure: HEMORRHOID BANDING;  Surgeon: Danie Binder, MD;  [facility];  Service: Endoscopy;  Laterality: N/A;  1:30 PM  . INSERTION OF SUPRAPUBIC CATHETER N/A 02/24/2019   Procedure: INSERTION OF SUPRAPUBIC CATHETER;  Surgeon: Kathie Rhodes, MD;  [facility];  Service: Urology;  Laterality: N/A;  . IR CM INJ ANY COLONIC TUBE W/FLUORO  10/30/2018  . IR GASTROSTOMY TUBE MOD SED  10/04/2018  . IR GASTROSTOMY TUBE REMOVAL  10/08/2019  . IR IMAGING GUIDED PORT INSERTION  10/04/2018  . IR REMOVAL TUN ACCESS W/ PORT W/O FL MOD SED  10/14/2018  . IR REPLACE G-TUBE SIMPLE WO FLUORO  11/03/2018  . IR REPLACE G-TUBE SIMPLE WO FLUORO  07/28/2019  . LAPAROSCOPIC INSERTION GASTROSTOMY TUBE Left 10/07/2018   Procedure: LAPAROSCOPIC  GASTROSTOMY TUBE;  Surgeon: Kinsinger, Arta Bruce, MD;  Location: [facility];  Service: General;  Laterality: Left;  Marland Kitchen MICROLARYNGOSCOPY N/A  Vitamin D deficiency 10/09/2014  . HLD (hyperlipidemia) 10/08/2012  . GERD (gastroesophageal reflux disease) 10/08/2012  . HTN (hypertension)   . Diverticulitis 10/25/2009    9:56 AM, 11/06/19 Josue Hector PT DPT  Physical Therapist with [facility]  [phone] [place] [address] [place], [name], [address] Phone: [phone]   Fax:  [phone]  Name: ROLDAN LAFOREST MRN: [id number] Date of Birth: 06/04/1945  [facility] [address] [place], [name], [address] Phone: [phone]   Fax:  [phone]  Physical Therapy Evaluation  Patient Details  Name: Logan Price MRN: [id number] Date of Birth: 07-Feb-1946 Referring Provider (PT): Evelina Dun FNP    Encounter Date: 11/06/2019   PT End of Session - 11/06/19 0937    Visit Number 1    Number of Visits 8    Date for PT Re-Evaluation 12/05/19    Authorization Type Medicare/ Generic commercial secondary    PT Start Time 0900    PT Stop Time 0945    PT Time Calculation (min) 45 min    Equipment Utilized During Treatment Gait belt    Activity Tolerance Patient tolerated treatment well    Behavior During Therapy [facility] for tasks assessed/performed           Past Medical History:  Diagnosis Date  . Diverticulitis   . Dysrhythmia 09/2018   episode of SVT while in hospital  . 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 GERD (gastroesophageal reflux disease)   . 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 HTN (hypertension)   . Hyperlipidemia     Past Surgical History:  Procedure Laterality Date  . BIOPSY  05/04/2015   Procedure: BIOPSY;  Surgeon: Danie Binder, MD;  [facility];  Service: Endoscopy;;  bx's of ileocecal valve   . BRONCHIAL BRUSHINGS  04/29/2019   Procedure: BRONCHIAL BRUSHINGS;  Surgeon: Garner Nash, DO;  Location: [facility];  Service: Pulmonary;;  . BRONCHIAL NEEDLE ASPIRATION BIOPSY  04/29/2019   Procedure: BRONCHIAL NEEDLE ASPIRATION BIOPSIES;  Surgeon: Garner Nash, DO;  Location: [facility];  Service: Pulmonary;;  . BRONCHIAL WASHINGS  04/29/2019   Procedure: BRONCHIAL WASHINGS;  Surgeon: Garner Nash, DO;  [facility];  Service: Pulmonary;;  . COLONOSCOPY WITH PROPOFOL N/A 05/04/2015   Dr. Oneida Alar: normal appearing ileum with prominent IC valve with tubular adenomas, moderate diverticulosis in sigmoid colon, ascending colon, and  retum. Moderate sized internal hemorrhoids. Surveillance in 5 years  . ELECTROMAGNETIC NAVIGATION BROCHOSCOPY  04/29/2019   Procedure: NAVIGATION BRONCHOSCOPY;  Surgeon: Garner Nash, DO;  Location: [facility];  Service: Pulmonary;;  . ESOPHAGOGASTRODUODENOSCOPY (EGD) WITH PROPOFOL N/A 12/19/2016   Procedure: ESOPHAGOGASTRODUODENOSCOPY (EGD) WITH PROPOFOL;  Surgeon: Danie Binder, MD;  [facility];  Service: Endoscopy;  Laterality: N/A;  11:30am  . FLEXIBLE SIGMOIDOSCOPY N/A 12/10/2015   hemorrhoid banding X 3   . HEMORRHOID BANDING N/A 12/10/2015   Procedure: HEMORRHOID BANDING;  Surgeon: Danie Binder, MD;  [facility];  Service: Endoscopy;  Laterality: N/A;  1:30 PM  . INSERTION OF SUPRAPUBIC CATHETER N/A 02/24/2019   Procedure: INSERTION OF SUPRAPUBIC CATHETER;  Surgeon: Kathie Rhodes, MD;  [facility];  Service: Urology;  Laterality: N/A;  . IR CM INJ ANY COLONIC TUBE W/FLUORO  10/30/2018  . IR GASTROSTOMY TUBE MOD SED  10/04/2018  . IR GASTROSTOMY TUBE REMOVAL  10/08/2019  . IR IMAGING GUIDED PORT INSERTION  10/04/2018  . IR REMOVAL TUN ACCESS W/ PORT W/O FL MOD SED  10/14/2018  . IR REPLACE G-TUBE SIMPLE WO FLUORO  11/03/2018  . IR REPLACE G-TUBE SIMPLE WO FLUORO  07/28/2019  . LAPAROSCOPIC INSERTION GASTROSTOMY TUBE Left 10/07/2018   Procedure: LAPAROSCOPIC  GASTROSTOMY TUBE;  Surgeon: Kinsinger, Arta Bruce, MD;  Location: [facility];  Service: General;  Laterality: Left;  Marland Kitchen MICROLARYNGOSCOPY N/A  [facility] [address] [place], [name], [address] Phone: [phone]   Fax:  [phone]  Physical Therapy Evaluation  Patient Details  Name: Logan Price MRN: [id number] Date of Birth: 07-Feb-1946 Referring Provider (PT): Evelina Dun FNP    Encounter Date: 11/06/2019   PT End of Session - 11/06/19 0937    Visit Number 1    Number of Visits 8    Date for PT Re-Evaluation 12/05/19    Authorization Type Medicare/ Generic commercial secondary    PT Start Time 0900    PT Stop Time 0945    PT Time Calculation (min) 45 min    Equipment Utilized During Treatment Gait belt    Activity Tolerance Patient tolerated treatment well    Behavior During Therapy [facility] for tasks assessed/performed           Past Medical History:  Diagnosis Date  . Diverticulitis   . Dysrhythmia 09/2018   episode of SVT while in hospital  . 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 GERD (gastroesophageal reflux disease)   . 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 HTN (hypertension)   . Hyperlipidemia     Past Surgical History:  Procedure Laterality Date  . BIOPSY  05/04/2015   Procedure: BIOPSY;  Surgeon: Danie Binder, MD;  [facility];  Service: Endoscopy;;  bx's of ileocecal valve   . BRONCHIAL BRUSHINGS  04/29/2019   Procedure: BRONCHIAL BRUSHINGS;  Surgeon: Garner Nash, DO;  Location: [facility];  Service: Pulmonary;;  . BRONCHIAL NEEDLE ASPIRATION BIOPSY  04/29/2019   Procedure: BRONCHIAL NEEDLE ASPIRATION BIOPSIES;  Surgeon: Garner Nash, DO;  Location: [facility];  Service: Pulmonary;;  . BRONCHIAL WASHINGS  04/29/2019   Procedure: BRONCHIAL WASHINGS;  Surgeon: Garner Nash, DO;  [facility];  Service: Pulmonary;;  . COLONOSCOPY WITH PROPOFOL N/A 05/04/2015   Dr. Oneida Alar: normal appearing ileum with prominent IC valve with tubular adenomas, moderate diverticulosis in sigmoid colon, ascending colon, and  retum. Moderate sized internal hemorrhoids. Surveillance in 5 years  . ELECTROMAGNETIC NAVIGATION BROCHOSCOPY  04/29/2019   Procedure: NAVIGATION BRONCHOSCOPY;  Surgeon: Garner Nash, DO;  Location: [facility];  Service: Pulmonary;;  . ESOPHAGOGASTRODUODENOSCOPY (EGD) WITH PROPOFOL N/A 12/19/2016   Procedure: ESOPHAGOGASTRODUODENOSCOPY (EGD) WITH PROPOFOL;  Surgeon: Danie Binder, MD;  [facility];  Service: Endoscopy;  Laterality: N/A;  11:30am  . FLEXIBLE SIGMOIDOSCOPY N/A 12/10/2015   hemorrhoid banding X 3   . HEMORRHOID BANDING N/A 12/10/2015   Procedure: HEMORRHOID BANDING;  Surgeon: Danie Binder, MD;  [facility];  Service: Endoscopy;  Laterality: N/A;  1:30 PM  . INSERTION OF SUPRAPUBIC CATHETER N/A 02/24/2019   Procedure: INSERTION OF SUPRAPUBIC CATHETER;  Surgeon: Kathie Rhodes, MD;  [facility];  Service: Urology;  Laterality: N/A;  . IR CM INJ ANY COLONIC TUBE W/FLUORO  10/30/2018  . IR GASTROSTOMY TUBE MOD SED  10/04/2018  . IR GASTROSTOMY TUBE REMOVAL  10/08/2019  . IR IMAGING GUIDED PORT INSERTION  10/04/2018  . IR REMOVAL TUN ACCESS W/ PORT W/O FL MOD SED  10/14/2018  . IR REPLACE G-TUBE SIMPLE WO FLUORO  11/03/2018  . IR REPLACE G-TUBE SIMPLE WO FLUORO  07/28/2019  . LAPAROSCOPIC INSERTION GASTROSTOMY TUBE Left 10/07/2018   Procedure: LAPAROSCOPIC  GASTROSTOMY TUBE;  Surgeon: Kinsinger, Arta Bruce, MD;  Location: [facility];  Service: General;  Laterality: Left;  Marland Kitchen MICROLARYNGOSCOPY N/A  [facility] [address] [place], [name], [address] Phone: [phone]   Fax:  [phone]  Physical Therapy Evaluation  Patient Details  Name: Logan Price MRN: [id number] Date of Birth: 07-Feb-1946 Referring Provider (PT): Evelina Dun FNP    Encounter Date: 11/06/2019   PT End of Session - 11/06/19 0937    Visit Number 1    Number of Visits 8    Date for PT Re-Evaluation 12/05/19    Authorization Type Medicare/ Generic commercial secondary    PT Start Time 0900    PT Stop Time 0945    PT Time Calculation (min) 45 min    Equipment Utilized During Treatment Gait belt    Activity Tolerance Patient tolerated treatment well    Behavior During Therapy [facility] for tasks assessed/performed           Past Medical History:  Diagnosis Date  . Diverticulitis   . Dysrhythmia 09/2018   episode of SVT while in hospital  . 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 GERD (gastroesophageal reflux disease)   . 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 HTN (hypertension)   . Hyperlipidemia     Past Surgical History:  Procedure Laterality Date  . BIOPSY  05/04/2015   Procedure: BIOPSY;  Surgeon: Danie Binder, MD;  [facility];  Service: Endoscopy;;  bx's of ileocecal valve   . BRONCHIAL BRUSHINGS  04/29/2019   Procedure: BRONCHIAL BRUSHINGS;  Surgeon: Garner Nash, DO;  Location: [facility];  Service: Pulmonary;;  . BRONCHIAL NEEDLE ASPIRATION BIOPSY  04/29/2019   Procedure: BRONCHIAL NEEDLE ASPIRATION BIOPSIES;  Surgeon: Garner Nash, DO;  Location: [facility];  Service: Pulmonary;;  . BRONCHIAL WASHINGS  04/29/2019   Procedure: BRONCHIAL WASHINGS;  Surgeon: Garner Nash, DO;  [facility];  Service: Pulmonary;;  . COLONOSCOPY WITH PROPOFOL N/A 05/04/2015   Dr. Oneida Alar: normal appearing ileum with prominent IC valve with tubular adenomas, moderate diverticulosis in sigmoid colon, ascending colon, and  retum. Moderate sized internal hemorrhoids. Surveillance in 5 years  . ELECTROMAGNETIC NAVIGATION BROCHOSCOPY  04/29/2019   Procedure: NAVIGATION BRONCHOSCOPY;  Surgeon: Garner Nash, DO;  Location: [facility];  Service: Pulmonary;;  . ESOPHAGOGASTRODUODENOSCOPY (EGD) WITH PROPOFOL N/A 12/19/2016   Procedure: ESOPHAGOGASTRODUODENOSCOPY (EGD) WITH PROPOFOL;  Surgeon: Danie Binder, MD;  [facility];  Service: Endoscopy;  Laterality: N/A;  11:30am  . FLEXIBLE SIGMOIDOSCOPY N/A 12/10/2015   hemorrhoid banding X 3   . HEMORRHOID BANDING N/A 12/10/2015   Procedure: HEMORRHOID BANDING;  Surgeon: Danie Binder, MD;  [facility];  Service: Endoscopy;  Laterality: N/A;  1:30 PM  . INSERTION OF SUPRAPUBIC CATHETER N/A 02/24/2019   Procedure: INSERTION OF SUPRAPUBIC CATHETER;  Surgeon: Kathie Rhodes, MD;  [facility];  Service: Urology;  Laterality: N/A;  . IR CM INJ ANY COLONIC TUBE W/FLUORO  10/30/2018  . IR GASTROSTOMY TUBE MOD SED  10/04/2018  . IR GASTROSTOMY TUBE REMOVAL  10/08/2019  . IR IMAGING GUIDED PORT INSERTION  10/04/2018  . IR REMOVAL TUN ACCESS W/ PORT W/O FL MOD SED  10/14/2018  . IR REPLACE G-TUBE SIMPLE WO FLUORO  11/03/2018  . IR REPLACE G-TUBE SIMPLE WO FLUORO  07/28/2019  . LAPAROSCOPIC INSERTION GASTROSTOMY TUBE Left 10/07/2018   Procedure: LAPAROSCOPIC  GASTROSTOMY TUBE;  Surgeon: Kinsinger, Arta Bruce, MD;  Location: [facility];  Service: General;  Laterality: Left;  Marland Kitchen MICROLARYNGOSCOPY N/A

## 2019-11-10 ENCOUNTER — Ambulatory Visit (HOSPITAL_COMMUNITY): Payer: Medicare Other | Admitting: Physical Therapy

## 2019-11-10 ENCOUNTER — Other Ambulatory Visit: Payer: Self-pay

## 2019-11-10 ENCOUNTER — Encounter (HOSPITAL_COMMUNITY): Payer: Self-pay | Admitting: Physical Therapy

## 2019-11-10 DIAGNOSIS — I890 Lymphedema, not elsewhere classified: Secondary | ICD-10-CM

## 2019-11-10 DIAGNOSIS — M545 Low back pain: Secondary | ICD-10-CM | POA: Diagnosis not present

## 2019-11-10 DIAGNOSIS — G8929 Other chronic pain: Secondary | ICD-10-CM | POA: Diagnosis not present

## 2019-11-10 DIAGNOSIS — R2689 Other abnormalities of gait and mobility: Secondary | ICD-10-CM | POA: Diagnosis not present

## 2019-11-10 DIAGNOSIS — M6281 Muscle weakness (generalized): Secondary | ICD-10-CM | POA: Diagnosis not present

## 2019-11-10 NOTE — Therapy (Signed)
[facility] [address] [place], [place], [address] Phone: [phone]   Fax:  [phone]  Physical Therapy Evaluation  Patient Details  Name: Logan Price MRN: [id number] Date of Birth: 10/08/1945 Referring Provider (PT): Dr. Basilio Cairo   Encounter Date: 11/10/2019   PT End of Session - 11/10/19 1447    Visit Number 1    Number of Visits 4    Date for PT Re-Evaluation 12/10/19    Authorization Type Medicare/ Generic commercial secondary    Progress Note Due on Visit 4    PT Start Time 1315    PT Stop Time 1400    PT Time Calculation (min) 45 min    Activity Tolerance Patient tolerated treatment well    Behavior During Therapy [facility] for tasks assessed/performed           Past Medical History:  Diagnosis Date  . Diverticulitis   . Dysrhythmia 09/2018   episode of SVT while in hospital  . 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 GERD (gastroesophageal reflux disease)   . 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 HTN (hypertension)   . Hyperlipidemia     Past Surgical History:  Procedure Laterality Date  . BIOPSY  05/04/2015   Procedure: BIOPSY;  Surgeon: West Bali, MD;  [facility];  Service: Endoscopy;;  bx's of ileocecal valve   . BRONCHIAL BRUSHINGS  04/29/2019   Procedure: BRONCHIAL BRUSHINGS;  Surgeon: Josephine Igo, DO;  [facility];  Service: Pulmonary;;  . BRONCHIAL NEEDLE ASPIRATION BIOPSY  04/29/2019   Procedure: BRONCHIAL NEEDLE ASPIRATION BIOPSIES;  Surgeon: Josephine Igo, DO;  [facility];  Service: Pulmonary;;  . BRONCHIAL WASHINGS  04/29/2019   Procedure: BRONCHIAL WASHINGS;  Surgeon: Josephine Igo, DO;  [facility];  Service: Pulmonary;;  . COLONOSCOPY WITH PROPOFOL N/A 05/04/2015   Dr. Darrick Penna: normal appearing ileum with prominent IC valve with tubular adenomas, moderate diverticulosis in sigmoid colon, ascending colon, and retum. Moderate sized  internal hemorrhoids. Surveillance in 5 years  . ELECTROMAGNETIC NAVIGATION BROCHOSCOPY  04/29/2019   Procedure: NAVIGATION BRONCHOSCOPY;  Surgeon: Josephine Igo, DO;  [facility];  Service: Pulmonary;;  . ESOPHAGOGASTRODUODENOSCOPY (EGD) WITH PROPOFOL N/A 12/19/2016   Procedure: ESOPHAGOGASTRODUODENOSCOPY (EGD) WITH PROPOFOL;  Surgeon: West Bali, MD;  [facility];  Service: Endoscopy;  Laterality: N/A;  11:30am  . FLEXIBLE SIGMOIDOSCOPY N/A 12/10/2015   hemorrhoid banding X 3   . HEMORRHOID BANDING N/A 12/10/2015   Procedure: HEMORRHOID BANDING;  Surgeon: West Bali, MD;  [facility];  Service: Endoscopy;  Laterality: N/A;  1:30 PM  . INSERTION OF SUPRAPUBIC CATHETER N/A 02/24/2019   Procedure: INSERTION OF SUPRAPUBIC CATHETER;  Surgeon: Ihor Gully, MD;  [facility]: [facility];  Service: Urology;  Laterality: N/A;  . IR CM INJ ANY COLONIC TUBE W/FLUORO  10/30/2018  . IR GASTROSTOMY TUBE MOD SED  10/04/2018  . IR GASTROSTOMY TUBE REMOVAL  10/08/2019  . IR IMAGING GUIDED PORT INSERTION  10/04/2018  . IR REMOVAL TUN ACCESS W/ PORT W/O FL MOD SED  10/14/2018  . IR REPLACE G-TUBE SIMPLE WO FLUORO  11/03/2018  . IR REPLACE G-TUBE SIMPLE WO FLUORO  07/28/2019  . LAPAROSCOPIC INSERTION GASTROSTOMY TUBE Left 10/07/2018   Procedure: LAPAROSCOPIC  GASTROSTOMY TUBE;  Surgeon: Kinsinger, De Blanch, MD;  [facility];  Service: General;  Laterality: Left;  Marland Kitchen MICROLARYNGOSCOPY N/A 09/27/2018  Procedure: MICRO DIRECT LARYNGOSCOPY WITH BIOPSY;  Surgeon: Newman Pies, MD;  [facility];  Service: ENT;  Laterality: N/A;  . None to date     As of 04/14/15  . POLYPECTOMY  05/04/2015   Procedure: POLYPECTOMY;  Surgeon: West Bali, MD;  [facility];  Service: Endoscopy;;  descending colon polyp, ascending colon polyp  . SAVORY DILATION N/A 12/19/2016   Procedure: SAVORY DILATION;  Surgeon: West Bali, MD;  [facility];  Service: Endoscopy;  Laterality:  N/A;  . TRACHEOSTOMY TUBE PLACEMENT N/A 09/27/2018   Procedure: AWAKE TRACHEOSTOMY;  Surgeon: Newman Pies, MD;  [facility];  Service: ENT;  Laterality: N/A;  . TRANSURETHRAL RESECTION OF PROSTATE N/A 02/24/2019   Procedure: TRANSURETHRAL RESECTION OF THE PROSTATE (TURP);  Surgeon: Ihor Gully, MD;  [facility];  Service: Urology;  Laterality: N/A;  . VIDEO BRONCHOSCOPY WITH ENDOBRONCHIAL NAVIGATION N/A 04/29/2019   Procedure: VIDEO BRONCHOSCOPY;  Surgeon: Josephine Igo, DO;  [facility];  Service: Pulmonary;  Laterality: N/A;    There were no vitals filed for this visit.    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  He also had to have an emergency  tracheostomy.  He was having difficulty swallowing and was referred to speech therapy who noted that there was swelling in the throat area which might be affecting the pt's ability to swallow, therfore she requested a lymphedema consult.    Pertinent History History of squamous cell carcinoma of the head and neck. Patient underwent PEG tube placement, tracheostomy tube placement and subsequently completed 7 weeks of radiation to the head and neck.  Recent PET findings of Left lung posible metastasis and Rt lung SCC.  Pt underwent radiation to both sides for the lungs.  Other health history unremarkable per pt.    Patient Stated Goals swallow better    Currently in Pain? Yes   back we will not be addressing this             [facility] PT Assessment - 11/10/19 1318      Assessment   Medical Diagnosis head and neck and lung cancer    Referring Provider (PT) Dr. Basilio Cairo    Onset Date/Surgical Date 09/28/18    Hand Dominance Left    Next MD Visit September    Prior Therapy no      Precautions   Precaution Comments PEG tube, radiation history to the neck and lungs bilaterally       Restrictions   Weight Bearing Restrictions No       Home Environment   Living Environment Private residence    Living Arrangements Spouse/significant other    Available Help at Discharge Family      Prior Function   Level of Independence Independent    Vocation Retired    Leisure None      Observation/Other Assessments   Observations pt stands and ambulates with bil knee flexion and lumbar flexion with use of RW             LYMPHEDEMA/ONCOLOGY QUESTIONNAIRE - 11/10/19 0001      Treatment   Past Radiation Treatment Yes    Date 11/09/18   appproximate    Body Site throat       What other symptoms do you have   Are you Having Heaviness or Tightness No  Are you having Pain No      Lymphedema Stage   Stage STAGE 1 SPONTANEOUSLY REVERSIBLE      Lymphedema Assessments   Lymphedema Assessments Head and Neck      Head and Neck   Right Lateral Nostril at base of nose to medial tragus  9.6 cm    Left Lateral Nostril at base of nose to medial tragus  10 cm    Right Corner of mouth to where ear lobe meets face 9 cm    Left Corner of mouth to where ear lobe meets face 9.3 cm    4 cm superior to sternal notch around neck 38 cm    6 cm superior to sternal notch around neck 38.5 cm                   Objective measurements completed on examination: See above findings.               PT Education - 11/10/19 1445    Education Details self instruction on  self manual to decrease lymphedema 1-8, pt given throat compression requested to build up to where he is using it 4 hr everyday.    Person(s) Educated Patient;Spouse    Methods Explanation;Demonstration;Tactile cues;Verbal cues;Handout    Comprehension Returned demonstration;Verbalized understanding            PT Short Term Goals - 11/10/19 1455      PT SHORT TERM GOAL #1   Title Pt to be I with self manual techniques to increase lymph circulation .    Time 2    Period Weeks    Status New    Target Date 11/24/19      PT SHORT TERM GOAL #2   Title PT  to be using compression bandaging 4 hrs per day to reduce edema in pt neck .    Time 2    Period Weeks    Status New             PT Long Term Goals - 11/10/19 1457      PT LONG TERM GOAL #1   Title PT to have no measurable edema in face or neck area.    Time 4    Period Weeks    Status New    Target Date 12/06/19      PT LONG TERM GOAL #2   Title PT to verbalize the need to get a referral to therapy if he notices increased head and neck edema and is not able to control it on his own.    Time 4    Period Weeks    Status New                  Plan - 11/10/19 1448    Clinical Impression Statement PT is a 74 yo male who is being referred to skilled PT for head and neck lymphedema.  He states that the edema has improved significantly since he was referred as it has taken a while to get him into the clinic.  He is still noting some edema on the Lt side which is greater in the AM.  The therapist explained to Mr. Tatunm and his wife what lymphedema is and that his lymph system was most likely affected by both the disection of the lymph nodes as well as the radiation treatment.  The therapist explained that there is no cure for lymphedema but it can be controlled.  The therapist cut  a foam insert for compression and explained self manual 1-8 at this session.  Mr. Teem will benefit from skilled PT to learn self care for head and neck lymphedema.    Personal Factors and Comorbidities Age;Fitness;Comorbidity 2    Comorbidities radiation history, unknown cancer status    Examination-Activity Limitations Other   swallowing   Examination-Participation Restrictions Other   eating   Stability/Clinical Decision Making Stable/Uncomplicated    Rehab Potential Good    PT Frequency 1x / week    PT Duration 4 weeks    PT Treatment/Interventions Therapeutic exercise;Manual techniques;Traction;Manual lymph drainage;Patient/family education;Compression bandaging;Scar mobilization    PT Next Visit  Plan check self manual techniques, teach neck self manual and check if there is any tongue or mouth swelling    PT Home Exercise Plan 11/06/19: sit to stand, ab brace    Consulted and Agree with Plan of Care Patient;Family member/caregiver    Family Member Consulted wife           Patient will benefit from skilled therapeutic intervention in order to improve the following deficits and impairments:  Pain, Other (comment), Increased edema  Visit Diagnosis: Lymphedema, not elsewhere classified     Problem List Patient Active Problem List   Diagnosis Date Noted  . Malignant neoplasm of right upper lobe of lung (HCC) 05/27/2019  . Primary cancer of left upper lobe of lung (HCC) 05/27/2019  . Pseudomonas aeruginosa infection 05/21/2019  . Tracheobronchitis 05/21/2019  . Hx of radiation therapy 05/21/2019  . 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HCC) 05/21/2019  . Squamous cell lung cancer, right (HCC) 05/21/2019  . Adenocarcinoma, lung, left (HCC) 05/21/2019  . Lung nodule 04/23/2019  . Ground glass opacity present on imaging of lung 04/23/2019  . Head and neck cancer (HCC) 04/23/2019  . Tracheostomy tube present (HCC) 04/23/2019  . PEG (percutaneous endoscopic gastrostomy) adjustment/replacement/removal (HCC) 03/08/2019  . 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 Pleural effusion   . 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HCC)   . Bacteremia   . Leukocytosis   . Normocytic anemia   . Protein-calorie malnutrition, severe 10/08/2018  . PSVT (paroxysmal supraventricular tachycardia) (HCC) 10/03/2018  . Atrial fibrillation (HCC) 10/03/2018  . Goals of care, counseling/discussion   . Dysphagia   . Loss of weight   . 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 Gastritis determined by endoscopy   . Esophageal dysphagia   . 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 Globus sensation 12/12/2016  . Smokes with greater than 40 pack year history 10/20/2016  . Hemorrhoids, internal,  with bleeding 10/06/2015  . History of colonic polyps 04/14/2015  . Vitamin D deficiency 10/09/2014  . HLD (hyperlipidemia) 10/08/2012  . GERD (gastroesophageal reflux disease) 10/08/2012  . HTN (hypertension)   . Diverticulitis 10/25/2009    Virgina Organ, PT CLT [phone] 11/10/2019, 2:59 PM  [facility] [address] [place], [place], [address] Phone: [phone]   Fax:  [phone]  Name: Logan Price MRN: [id number] Date of Birth: 02/15/1946

## 2019-11-11 ENCOUNTER — Encounter (HOSPITAL_COMMUNITY): Payer: Self-pay

## 2019-11-11 ENCOUNTER — Ambulatory Visit (HOSPITAL_COMMUNITY): Payer: Medicare Other

## 2019-11-11 DIAGNOSIS — G8929 Other chronic pain: Secondary | ICD-10-CM

## 2019-11-11 DIAGNOSIS — R2689 Other abnormalities of gait and mobility: Secondary | ICD-10-CM | POA: Diagnosis not present

## 2019-11-11 DIAGNOSIS — M6281 Muscle weakness (generalized): Secondary | ICD-10-CM | POA: Diagnosis not present

## 2019-11-11 DIAGNOSIS — I890 Lymphedema, not elsewhere classified: Secondary | ICD-10-CM | POA: Diagnosis not present

## 2019-11-11 DIAGNOSIS — M545 Low back pain: Secondary | ICD-10-CM | POA: Diagnosis not present

## 2019-11-11 NOTE — Therapy (Signed)
[facility] [address] [place], [place], [address] Phone: [phone]   Fax:  [phone]  Physical Therapy Treatment  Patient Details  Name: Logan Price MRN: [id number] Date of Birth: May 20, 1945 Referring Provider (PT): Dr. Basilio Cairo   Encounter Date: 11/11/2019   PT End of Session - 11/11/19 1319    Visit Number 2    Number of Visits 8    Date for PT Re-Evaluation 12/05/19    Authorization Type Medicare/ Generic commercial secondary    Progress Note Due on Visit 8    PT Start Time 1316    PT Stop Time 1355    PT Time Calculation (min) 39 min    Equipment Utilized During Treatment --   [facility]   Activity Tolerance Patient tolerated treatment well    Behavior During Therapy [facility] for tasks assessed/performed           Past Medical History:  Diagnosis Date  . Diverticulitis   . Dysrhythmia 09/2018   episode of SVT while in hospital  . 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 GERD (gastroesophageal reflux disease)   . 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 HTN (hypertension)   . Hyperlipidemia     Past Surgical History:  Procedure Laterality Date  . BIOPSY  05/04/2015   Procedure: BIOPSY;  Surgeon: West Bali, MD;  [facility];  Service: Endoscopy;;  bx's of ileocecal valve   . BRONCHIAL BRUSHINGS  04/29/2019   Procedure: BRONCHIAL BRUSHINGS;  Surgeon: Josephine Igo, DO;  [facility];  Service: Pulmonary;;  . BRONCHIAL NEEDLE ASPIRATION BIOPSY  04/29/2019   Procedure: BRONCHIAL NEEDLE ASPIRATION BIOPSIES;  Surgeon: Josephine Igo, DO;  [facility];  Service: Pulmonary;;  . BRONCHIAL WASHINGS  04/29/2019   Procedure: BRONCHIAL WASHINGS;  Surgeon: Josephine Igo, DO;  [facility];  Service: Pulmonary;;  . COLONOSCOPY WITH PROPOFOL N/A 05/04/2015   Dr. Darrick Penna: normal appearing ileum with prominent IC valve with tubular adenomas, moderate diverticulosis in sigmoid colon,  ascending colon, and retum. Moderate sized internal hemorrhoids. Surveillance in 5 years  . ELECTROMAGNETIC NAVIGATION BROCHOSCOPY  04/29/2019   Procedure: NAVIGATION BRONCHOSCOPY;  Surgeon: Josephine Igo, DO;  [facility];  Service: Pulmonary;;  . ESOPHAGOGASTRODUODENOSCOPY (EGD) WITH PROPOFOL N/A 12/19/2016   Procedure: ESOPHAGOGASTRODUODENOSCOPY (EGD) WITH PROPOFOL;  Surgeon: West Bali, MD;  [facility];  Service: Endoscopy;  Laterality: N/A;  11:30am  . FLEXIBLE SIGMOIDOSCOPY N/A 12/10/2015   hemorrhoid banding X 3   . HEMORRHOID BANDING N/A 12/10/2015   Procedure: HEMORRHOID BANDING;  Surgeon: West Bali, MD;  [facility];  Service: Endoscopy;  Laterality: N/A;  1:30 PM  . INSERTION OF SUPRAPUBIC CATHETER N/A 02/24/2019   Procedure: INSERTION OF SUPRAPUBIC CATHETER;  Surgeon: Ihor Gully, MD;  [facility];  Service: Urology;  Laterality: N/A;  . IR CM INJ ANY COLONIC TUBE W/FLUORO  10/30/2018  . IR GASTROSTOMY TUBE MOD SED  10/04/2018  . IR GASTROSTOMY TUBE REMOVAL  10/08/2019  . IR IMAGING GUIDED PORT INSERTION  10/04/2018  . IR REMOVAL TUN ACCESS W/ PORT W/O FL MOD SED  10/14/2018  . IR REPLACE G-TUBE SIMPLE WO FLUORO  11/03/2018  . IR REPLACE G-TUBE SIMPLE WO FLUORO  07/28/2019  . LAPAROSCOPIC INSERTION GASTROSTOMY TUBE Left 10/07/2018   Procedure: LAPAROSCOPIC  GASTROSTOMY TUBE;  Surgeon: Sheliah Hatch De Blanch, MD;  Location: [facility] OR;  Service:  General;  Laterality: Left;  Marland Kitchen MICROLARYNGOSCOPY N/A 09/27/2018   Procedure: MICRO DIRECT LARYNGOSCOPY WITH BIOPSY;  Surgeon: Newman Pies, MD;  Location: [facility] OR;  Service: ENT;  Laterality: N/A;  . None to date     As of 04/14/15  . POLYPECTOMY  05/04/2015   Procedure: POLYPECTOMY;  Surgeon: West Bali, MD;  [facility];  Service: Endoscopy;;  descending colon polyp, ascending colon polyp  . SAVORY DILATION N/A 12/19/2016   Procedure: SAVORY DILATION;  Surgeon: West Bali, MD;  [facility];  Service: Endoscopy;  Laterality: N/A;  . TRACHEOSTOMY TUBE PLACEMENT N/A 09/27/2018   Procedure: AWAKE TRACHEOSTOMY;  Surgeon: Newman Pies, MD;  [facility];  Service: ENT;  Laterality: N/A;  . TRANSURETHRAL RESECTION OF PROSTATE N/A 02/24/2019   Procedure: TRANSURETHRAL RESECTION OF THE PROSTATE (TURP);  Surgeon: Ihor Gully, MD;  [facility];  Service: Urology;  Laterality: N/A;  . VIDEO BRONCHOSCOPY WITH ENDOBRONCHIAL NAVIGATION N/A 04/29/2019   Procedure: VIDEO BRONCHOSCOPY;  Surgeon: Josephine Igo, DO;  [facility];  Service: Pulmonary;  Laterality: N/A;    There were no vitals filed for this visit.   Subjective Assessment - 11/11/19 1318    Subjective Pt stated he is feeling good today.  Reports he has difficulty after sitting too long the back tightens up.    Pertinent History History of squamous cell carcinoma of the head and neck. Patient underwent PEG tube placement, tracheostomy tube placement and subsequently completed 7 weeks of radiation to the head and neck.  Recent PET findings of Left lung posible metastasis and Rt lung SCC.  Pt underwent radiation to both sides for the lungs.  Other health history unremarkable per pt.    Patient Stated Goals swallow better    Currently in Pain? No/denies                             [facility] Adult PT Treatment/Exercise - 11/11/19 0001      Knee/Hip Exercises: Stretches   Other Knee/Hip Stretches Prone x ; partial POE x 2 min    Other Knee/Hip Stretches LTR 5x 10" holds in pain free range      Knee/Hip Exercises: Standing   Other Standing Knee Exercises 3D hip excursion 10x each      Knee/Hip Exercises: Seated   Other Seated Knee/Hip Exercises Educated importance of posture for pain control    Other Seated Knee/Hip Exercises posterior shoulder rolls.  Wback 10x 3"    Sit to [employer] 10 reps;without UE support   eccentric control     Knee/Hip Exercises: Supine   Bridges 10 reps    Bridges  Limitations 5 sec hold      Knee/Hip Exercises: Prone   Other Prone Exercises quadruped cat/cow 5x 10"                  PT Education - 11/11/19 1329    Education Details Reviewed goals, educated importance of HEP complaince, pt able to recall and demonstrate STS.  Educated importance of posture for pain control.  Explained rationale behind therex this session to improve lumbar mobility    Person(s) Educated Patient;Spouse    Methods Explanation;Demonstration;Verbal cues    Comprehension Verbalized understanding;Returned demonstration            PT Short Term Goals - 11/10/19 1455      PT SHORT TERM GOAL #1   Title  Pt to be I with self manual techniques to increase lymph circulation .    Time 2    Period Weeks    Status New    Target Date 11/24/19      PT SHORT TERM GOAL #2   Title PT to be using compression bandaging 4 hrs per day to reduce edema in pt neck .    Time 2    Period Weeks    Status New             PT Long Term Goals - 11/10/19 1457      PT LONG TERM GOAL #1   Title PT to have no measurable edema in face or neck area.    Time 4    Period Weeks    Status New    Target Date 12/06/19      PT LONG TERM GOAL #2   Title PT to verbalize the need to get a referral to therapy if he notices increased head and neck edema and is not able to control it on his own.    Time 4    Period Weeks    Status New                 Plan - 11/11/19 1338    Clinical Impression Statement Reviewed goals and assured compliance with HEP.  PT able to recall current HEP and demonstrate good mechanics.  Session focus with lumbar mobility with demonstrate and verbal cueing for proper form and mechanics.  No reports of pain through session.    Personal Factors and Comorbidities Age;Fitness;Comorbidity 2    Comorbidities radiation history, unknown cancer status    Examination-Activity Limitations Other   swallowing   Examination-Participation Restrictions Other    eating   Stability/Clinical Decision Making Stable/Uncomplicated    Clinical Decision Making Low    Rehab Potential Good    PT Frequency 1x / week    PT Duration 4 weeks    PT Treatment/Interventions Therapeutic exercise;Manual techniques;Traction;Manual lymph drainage;Patient/family education;Compression bandaging;Scar mobilization    PT Next Visit Plan Lymph plan:  check self manual techniques, teach neck self manual and check if there is any tongue or mouth swelling; Ortho plan: Progress core and hip strength and lumbar mobility on table, then to standing functional strength as tolerated.    PT Home Exercise Plan 11/06/19: sit to stand, ab brace; 11/11/19: LTR and bridge    Consulted and Agree with Plan of Care Patient;Family member/caregiver    Family Member Consulted wife           Patient will benefit from skilled therapeutic intervention in order to improve the following deficits and impairments:  Pain, Other (comment), Increased edema  Visit Diagnosis: Chronic bilateral low back pain, unspecified whether sciatica present  Other abnormalities of gait and mobility     Problem List Patient Active Problem List   Diagnosis Date Noted  . Malignant neoplasm of right upper lobe of lung (HCC) 05/27/2019  . Primary cancer of left upper lobe of lung (HCC) 05/27/2019  . Pseudomonas aeruginosa infection 05/21/2019  . Tracheobronchitis 05/21/2019  . Hx of radiation therapy 05/21/2019  . 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HCC) 05/21/2019  . Squamous cell lung cancer, right (HCC) 05/21/2019  . Adenocarcinoma, lung, left (HCC) 05/21/2019  . Lung nodule 04/23/2019  . Ground glass opacity present on imaging of lung 04/23/2019  . Head and neck cancer (HCC) 04/23/2019  . Tracheostomy tube present (HCC) 04/23/2019  . PEG (percutaneous endoscopic  gastrostomy) adjustment/replacement/removal (HCC) 03/08/2019  . 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 Pleural effusion   . 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HCC)    . Bacteremia   . Leukocytosis   . Normocytic anemia   . Protein-calorie malnutrition, severe 10/08/2018  . PSVT (paroxysmal supraventricular tachycardia) (HCC) 10/03/2018  . Atrial fibrillation (HCC) 10/03/2018  . Goals of care, counseling/discussion   . Dysphagia   . Loss of weight   . 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 Gastritis determined by endoscopy   . Esophageal dysphagia   . 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 Globus sensation 12/12/2016  . Smokes with greater than 40 pack year history 10/20/2016  . Hemorrhoids, internal, with bleeding 10/06/2015  . History of colonic polyps 04/14/2015  . Vitamin D deficiency 10/09/2014  . HLD (hyperlipidemia) 10/08/2012  . GERD (gastroesophageal reflux disease) 10/08/2012  . HTN (hypertension)   . Diverticulitis 10/25/2009   Becky Sax, LPTA/CLT; CBIS [phone]  Juel Burrow 11/11/2019, 1:59 PM  [facility] [address] [place], [place], [address] Phone: [phone]   Fax:  [phone]  Name: Logan Price MRN: [id number] Date of Birth: 09-14-1945

## 2019-11-11 NOTE — Patient Instructions (Signed)
Lower Trunk Rotation Stretch    Keeping back flat and feet together, rotate knees to left side. Hold 10 seconds. Repeat 5 times per set. Do 2 sets per session.   http://orth.exer.us/122   Copyright  VHI. All rights reserved.   Bridging    Slowly raise buttocks from floor, keeping stomach tight. Repeat 10 times per set. Do 2 sets per session.  http://orth.exer.us/1096   Copyright  VHI. All rights reserved.

## 2019-11-12 ENCOUNTER — Other Ambulatory Visit: Payer: Self-pay

## 2019-11-12 ENCOUNTER — Ambulatory Visit (HOSPITAL_COMMUNITY): Payer: Medicare Other

## 2019-11-12 ENCOUNTER — Encounter (HOSPITAL_COMMUNITY): Payer: Self-pay

## 2019-11-12 DIAGNOSIS — M545 Low back pain, unspecified: Secondary | ICD-10-CM

## 2019-11-12 DIAGNOSIS — I890 Lymphedema, not elsewhere classified: Secondary | ICD-10-CM | POA: Diagnosis not present

## 2019-11-12 DIAGNOSIS — G8929 Other chronic pain: Secondary | ICD-10-CM | POA: Diagnosis not present

## 2019-11-12 DIAGNOSIS — R2689 Other abnormalities of gait and mobility: Secondary | ICD-10-CM | POA: Diagnosis not present

## 2019-11-12 DIAGNOSIS — M6281 Muscle weakness (generalized): Secondary | ICD-10-CM | POA: Diagnosis not present

## 2019-11-12 NOTE — Therapy (Signed)
[place], [name], [address] Phone: [phone]   Fax:  [phone]  Physical Therapy Treatment  Patient Details  Name: Logan Price MRN: [id number] Date of Birth: Sep 20, 1945 Referring Provider (PT): Dr. Isidore Moos   Encounter Date: 11/12/2019   PT End of Session - 11/12/19 1059    Visit Number 3    Number of Visits 8    Date for PT Re-Evaluation 12/05/19    Authorization Type Medicare/ Generic commercial secondary    Progress Note Due on Visit 8    PT Start Time 1049    PT Stop Time 1127    PT Time Calculation (min) 38 min    Equipment Utilized During Treatment --   [facility]   Activity Tolerance Patient tolerated treatment well    Behavior During Therapy [facility] for tasks assessed/performed           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   HTN (hypertension)    Hyperlipidemia     Past Surgical History:  Procedure Laterality Date   BIOPSY  05/04/2015   Procedure: BIOPSY;  Surgeon: Danie Binder, MD;  [facility];  Service: Endoscopy;;  bx's of ileocecal valve    BRONCHIAL BRUSHINGS  04/29/2019   Procedure: BRONCHIAL BRUSHINGS;  Surgeon: Garner Nash, DO;  Location: [place];  Service: Pulmonary;;   BRONCHIAL NEEDLE ASPIRATION BIOPSY  04/29/2019   Procedure: BRONCHIAL NEEDLE ASPIRATION BIOPSIES;  Surgeon: Garner Nash, DO;  Location: [place] [facility];  Service: Pulmonary;;   BRONCHIAL WASHINGS  04/29/2019   Procedure: BRONCHIAL WASHINGS;  Surgeon: Garner Nash, DO;  [facility];  Service: Pulmonary;;   COLONOSCOPY WITH PROPOFOL N/A 05/04/2015   Dr. Oneida Alar: normal appearing ileum with prominent IC valve with tubular adenomas, moderate diverticulosis in sigmoid colon,  ascending colon, and retum. Moderate sized internal hemorrhoids. Surveillance in 5 years   ELECTROMAGNETIC NAVIGATION BROCHOSCOPY  04/29/2019   Procedure: NAVIGATION BRONCHOSCOPY;  Surgeon: Garner Nash, DO;  Location: [place] [facility];  Service: Pulmonary;;   ESOPHAGOGASTRODUODENOSCOPY (EGD) WITH PROPOFOL N/A 12/19/2016   Procedure: ESOPHAGOGASTRODUODENOSCOPY (EGD) WITH PROPOFOL;  Surgeon: Danie Binder, MD;  [facility];  Service: Endoscopy;  Laterality: N/A;  11:30am   FLEXIBLE SIGMOIDOSCOPY N/A 12/10/2015   hemorrhoid banding X 3    HEMORRHOID BANDING N/A 12/10/2015   Procedure: HEMORRHOID BANDING;  Surgeon: Danie Binder, MD;  [facility];  Service: Endoscopy;  Laterality: N/A;  1:30 PM   INSERTION OF SUPRAPUBIC CATHETER N/A 02/24/2019   Procedure: INSERTION OF SUPRAPUBIC CATHETER;  Surgeon: Kathie Rhodes, MD;  [facility];  Service: Urology;  Laterality: N/A;   IR CM INJ ANY COLONIC TUBE W/FLUORO  10/30/2018   IR GASTROSTOMY TUBE MOD SED  10/04/2018   IR GASTROSTOMY TUBE REMOVAL  10/08/2019   IR IMAGING GUIDED PORT INSERTION  10/04/2018   IR REMOVAL TUN ACCESS W/ PORT W/O FL MOD SED  10/14/2018   IR REPLACE G-TUBE SIMPLE WO FLUORO  11/03/2018   IR REPLACE G-TUBE SIMPLE WO FLUORO  07/28/2019   LAPAROSCOPIC INSERTION GASTROSTOMY TUBE Left 10/07/2018   Procedure: LAPAROSCOPIC  GASTROSTOMY TUBE;  Surgeon: Mickeal Skinner, MD;  Location: [place];  Service:  Tracheostomy tube present ([place]) 04/23/2019   PEG (percutaneous endoscopic gastrostomy) adjustment/replacement/removal ([place]) 03/08/2019   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   Pleural effusion    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H[facility])    Bacteremia    Leukocytosis    Normocytic anemia    Protein-calorie malnutrition, severe 10/08/2018   PSVT (paroxysmal supraventricular tachycardia) ([place]) 10/03/2018   Atrial fibrillation ([place]) 10/03/2018   Goals of care, counseling/discussion    Dysphagia    Loss of weight    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   Gastritis determined by endoscopy    Esophageal dysphagia    False positive serological test for hepatitis C 12/13/2016   Globus sensation 12/12/2016   Smokes with greater than 40 pack year history 10/20/2016   Hemorrhoids, internal, with bleeding 10/06/2015   History of colonic polyps 04/14/2015   Vitamin D deficiency 10/09/2014   HLD (hyperlipidemia) 10/08/2012   GERD (gastroesophageal reflux disease) 10/08/2012   HTN (hypertension)    Diverticulitis 10/25/2009   Ihor Austin, LPTA/CLT; CBIS [phone]  [name] 11/12/2019, 11:38 AM  [place], [name], [address] Phone: [phone]   Fax:  [phone]  Name: Logan Price MRN: [id number] Date of Birth: [birth year]  Tracheostomy tube present ([place]) 04/23/2019   PEG (percutaneous endoscopic gastrostomy) adjustment/replacement/removal ([place]) 03/08/2019   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   Pleural effusion    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HCC)    Bacteremia    Leukocytosis    Normocytic anemia    Protein-calorie malnutrition, severe 10/08/2018   PSVT (paroxysmal supraventricular tachycardia) ([place]) 10/03/2018   Atrial fibrillation ([place]) 10/03/2018   Goals of care, counseling/discussion    Dysphagia    Loss of weight    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   Gastritis determined by endoscopy    Esophageal dysphagia    False positive serological test for hepatitis C 12/13/2016   Globus sensation 12/12/2016   Smokes with greater than 40 pack year history 10/20/2016   Hemorrhoids, internal, with bleeding 10/06/2015   History of colonic polyps 04/14/2015   Vitamin D deficiency 10/09/2014   HLD (hyperlipidemia) 10/08/2012   GERD (gastroesophageal reflux disease) 10/08/2012   HTN (hypertension)    Diverticulitis 10/25/2009   Ihor Austin, LPTA/CLT; CBIS [phone]  [name] 11/12/2019, 11:38 AM  [place], [name], [address] Phone: [phone]   Fax:  [phone]  Name: Logan Price MRN: [id number] Date of Birth: [birth year]  [place], [name], [address] Phone: [phone]   Fax:  [phone]  Physical Therapy Treatment  Patient Details  Name: Logan Price MRN: [id number] Date of Birth: Sep 20, 1945 Referring Provider (PT): Dr. Isidore Moos   Encounter Date: 11/12/2019   PT End of Session - 11/12/19 1059    Visit Number 3    Number of Visits 8    Date for PT Re-Evaluation 12/05/19    Authorization Type Medicare/ Generic commercial secondary    Progress Note Due on Visit 8    PT Start Time 1049    PT Stop Time 1127    PT Time Calculation (min) 38 min    Equipment Utilized During Treatment --   [facility]   Activity Tolerance Patient tolerated treatment well    Behavior During Therapy [facility] for tasks assessed/performed           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   HTN (hypertension)    Hyperlipidemia     Past Surgical History:  Procedure Laterality Date   BIOPSY  05/04/2015   Procedure: BIOPSY;  Surgeon: Danie Binder, MD;  [facility];  Service: Endoscopy;;  bx's of ileocecal valve    BRONCHIAL BRUSHINGS  04/29/2019   Procedure: BRONCHIAL BRUSHINGS;  Surgeon: Garner Nash, DO;  Location: [place];  Service: Pulmonary;;   BRONCHIAL NEEDLE ASPIRATION BIOPSY  04/29/2019   Procedure: BRONCHIAL NEEDLE ASPIRATION BIOPSIES;  Surgeon: Garner Nash, DO;  Location: [place] [facility];  Service: Pulmonary;;   BRONCHIAL WASHINGS  04/29/2019   Procedure: BRONCHIAL WASHINGS;  Surgeon: Garner Nash, DO;  [facility];  Service: Pulmonary;;   COLONOSCOPY WITH PROPOFOL N/A 05/04/2015   Dr. Oneida Alar: normal appearing ileum with prominent IC valve with tubular adenomas, moderate diverticulosis in sigmoid colon,  ascending colon, and retum. Moderate sized internal hemorrhoids. Surveillance in 5 years   ELECTROMAGNETIC NAVIGATION BROCHOSCOPY  04/29/2019   Procedure: NAVIGATION BRONCHOSCOPY;  Surgeon: Garner Nash, DO;  Location: [place] [facility];  Service: Pulmonary;;   ESOPHAGOGASTRODUODENOSCOPY (EGD) WITH PROPOFOL N/A 12/19/2016   Procedure: ESOPHAGOGASTRODUODENOSCOPY (EGD) WITH PROPOFOL;  Surgeon: Danie Binder, MD;  [facility];  Service: Endoscopy;  Laterality: N/A;  11:30am   FLEXIBLE SIGMOIDOSCOPY N/A 12/10/2015   hemorrhoid banding X 3    HEMORRHOID BANDING N/A 12/10/2015   Procedure: HEMORRHOID BANDING;  Surgeon: Danie Binder, MD;  [facility];  Service: Endoscopy;  Laterality: N/A;  1:30 PM   INSERTION OF SUPRAPUBIC CATHETER N/A 02/24/2019   Procedure: INSERTION OF SUPRAPUBIC CATHETER;  Surgeon: Kathie Rhodes, MD;  [facility];  Service: Urology;  Laterality: N/A;   IR CM INJ ANY COLONIC TUBE W/FLUORO  10/30/2018   IR GASTROSTOMY TUBE MOD SED  10/04/2018   IR GASTROSTOMY TUBE REMOVAL  10/08/2019   IR IMAGING GUIDED PORT INSERTION  10/04/2018   IR REMOVAL TUN ACCESS W/ PORT W/O FL MOD SED  10/14/2018   IR REPLACE G-TUBE SIMPLE WO FLUORO  11/03/2018   IR REPLACE G-TUBE SIMPLE WO FLUORO  07/28/2019   LAPAROSCOPIC INSERTION GASTROSTOMY TUBE Left 10/07/2018   Procedure: LAPAROSCOPIC  GASTROSTOMY TUBE;  Surgeon: Mickeal Skinner, MD;  Location: [place];  Service:

## 2019-11-13 ENCOUNTER — Ambulatory Visit (INDEPENDENT_AMBULATORY_CARE_PROVIDER_SITE_OTHER): Payer: Medicare Other | Admitting: Licensed Clinical Social Worker

## 2019-11-13 DIAGNOSIS — K219 Gastro-esophageal reflux disease without esophagitis: Secondary | ICD-10-CM

## 2019-11-13 DIAGNOSIS — E785 Hyperlipidemia, unspecified: Secondary | ICD-10-CM

## 2019-11-13 DIAGNOSIS — E559 Vitamin D deficiency, unspecified: Secondary | ICD-10-CM

## 2019-11-13 DIAGNOSIS — I10 Essential (primary) hypertension: Secondary | ICD-10-CM | POA: Diagnosis not present

## 2019-11-13 NOTE — Patient Instructions (Addendum)
Licensed Clinical Social Worker Visit Information  Goals we discussed today:    .  Client will talk with LCSW in next 30 days to discuss ADLs completion for client (pt-stated)        CARE PLAN ENTRY   Current Barriers:   Atrial Fibrillation in client with Chronic Diagnoses of Adenocarcinoma of left lung, Vitamin D deficiency, GERD, HTN, HLD  Clinical Social Work Clinical Goal(s):   LCSW will call client in next 30 days to discuss client completion of ADLs  Interventions:    Talked with client about pain issues in his lower back and legs  Talked with client about mobility issues (uses a cane to help him walk)  Talked with client about food intake of client   Talked with client about transport needs of client  Talked with client about social support network (has support from his spouse and from his brother)  Talked with client about ADLs completion of client  Talked with client about upcoming medical appointments   Talked with client about physical therapy sessions received by client  Talked with [facility] about his skin condition at present  Encouraged Lister to talk with RNCM as needed regarding nursing needs of client  Talked with [place] about his history of treatments for cancer  Talked with client about relaxation techniques (watches favorite TV shows, visits with brother,talks with sister via phone)  Patient Self Care Activities:   Attends scheduled medical appointments Drives to medical appointments and drives to complete errands needed  Patient Self Care Deficits:  Mobility issues Pain issues   Initial goal documentation    Follow Up Plan: LCSW to call client in next 4 weeks to talk with client about client completion of daily ADLs  Materials Provided: No  The patient verbalized understanding of instructions provided today and declined a print copy of patient instruction materials.   Norva Riffle.Nakira Litzau MSW, LCSW Licensed Clinical Social  Worker [facility] Care Management [phone]

## 2019-11-13 NOTE — Chronic Care Management (AMB) (Addendum)
Chronic Care Management    Clinical Social Work Follow Up Note  11/13/2019 Name: Logan Price MRN: [id number] DOB: Feb 19, 1946  Logan Price is a 74 y.o. year old male who is a primary care patient of [name], FNP. The CCM team was consulted for assistance with [name] .   Review of patient status, including review of consultants reports, other relevant assessments, and collaboration with appropriate care team members and the patient's provider was performed as part of comprehensive patient evaluation and provision of chronic care management services.    SDOH (Social Determinants of Health) assessments performed: No; risk for tobacco use; risk for depression; risk for stress; risk for physical inactivity    Chronic Care Management from 09/08/2019 in [facility]  PHQ-9 Total Score 2      GAD 7 : Generalized Anxiety Score 09/08/2019  Nervous, Anxious, on Edge 0  Control/stop worrying 0  Worry too much - different things 0  Trouble relaxing 0  Restless 0  Easily annoyed or irritable 0  Afraid - awful might happen 0  Total GAD 7 Score 0  Anxiety Difficulty Somewhat difficult     Outpatient Encounter Medications as of 11/13/2019  Medication Sig   simvastatin (ZOCOR) 20 MG tablet TAKE 1 TABLET DAILY AT 6PM   diclofenac (VOLTAREN) 75 MG EC tablet Take 1 tablet (75 mg total) by mouth 2 (two) times daily.   doxazosin (CARDURA) 1 MG tablet Place 1 tablet (1 mg total) into feeding tube daily. (Patient taking differently: Take 1 mg by mouth daily. )   pantoprazole (PROTONIX) 40 MG tablet TAKE ONE TABLET DAILY 30 MINUTES PRIOR TO THE FIRST MEAL   No facility-administered encounter medications on file as of 11/13/2019.    Goals       Client will talk with LCSW in next 30 days to discuss ADLs completion for client (pt-stated)      CARE PLAN ENTRY   Current Barriers:  Atrial Fibrillation in client with Chronic Diagnoses of Adenocarcinoma of left  lung, Vitamin D deficiency, GERD, HTN, HLD  Clinical Social Work Clinical Goal(s):  LCSW will call client in next 30 days to discuss client completion of ADLs  Interventions:   Talked with client about pain issues in his lower back and legs Talked with client about mobility issues (uses a cane to help him walk) Talked with client about food intake of client  Talked with client about transport needs of client Talked with client about social support network (has support from his spouse and from his brother) Talked with client about ADLs completion of client Talked with client about upcoming medical appointments  Talked with client about physical therapy sessions received by client Talked with [facility] about his skin condition at present Encouraged Mohmmad to talk with RNCM as needed regarding nursing needs of client Talked with Kwasi about his history of treatments for cancer Talked with client about relaxation techniques (watches favorite TV shows, visits with brother,talks with sister via phone)  Patient Self Care Activities:   Attends scheduled medical appointments Drives to medical appointments and drives to complete errands needed  Patient Self Care Deficits:  Mobility issues Pain issues   Initial goal documentation    Follow Up Plan: LCSW to call client in next 4 weeks to talk with client about client completion of daily ADLs   Kelton Pillar.Joci Dress MSW, LCSW Licensed Clinical Social Worker [facility] Care [facility] [phone]  I have reviewed the CCM  documentation and agree with the written assessment and plan of care.  [name], FNP

## 2019-11-14 ENCOUNTER — Ambulatory Visit (HOSPITAL_COMMUNITY): Payer: Medicare Other | Admitting: Physical Therapy

## 2019-11-14 ENCOUNTER — Other Ambulatory Visit: Payer: Self-pay

## 2019-11-14 DIAGNOSIS — M545 Low back pain: Secondary | ICD-10-CM | POA: Diagnosis not present

## 2019-11-14 DIAGNOSIS — R2689 Other abnormalities of gait and mobility: Secondary | ICD-10-CM | POA: Diagnosis not present

## 2019-11-14 DIAGNOSIS — M6281 Muscle weakness (generalized): Secondary | ICD-10-CM | POA: Diagnosis not present

## 2019-11-14 DIAGNOSIS — I890 Lymphedema, not elsewhere classified: Secondary | ICD-10-CM | POA: Diagnosis not present

## 2019-11-14 DIAGNOSIS — G8929 Other chronic pain: Secondary | ICD-10-CM | POA: Diagnosis not present

## 2019-11-14 NOTE — Therapy (Signed)
[facility] [address] [place], [place], [address] Phone: [phone]   Fax:  [phone]  Physical Therapy Treatment  Patient Details  Name: Logan MEINDERS MRN: [id number] Date of Birth: [birth year] Referring Provider (PT): Dr. Basilio Cairo   Encounter Date: 11/14/2019   PT End of Session - 11/14/19 1138    Visit Number 2    Number of Visits 4    Date for PT Re-Evaluation 12/10/19    Authorization Type Medicare/ Generic commercial secondary    Progress Note Due on Visit 4    PT Start Time 1050    PT Stop Time 1135    PT Time Calculation (min) 45 min    Activity Tolerance Patient tolerated treatment well    Behavior During Therapy [facility] for tasks assessed/performed           Past Medical History:  Diagnosis Date  . Diverticulitis   . Dysrhythmia 09/2018   episode of SVT while in hospital  . 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 GERD (gastroesophageal reflux disease)   . 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 HTN (hypertension)   . Hyperlipidemia     Past Surgical History:  Procedure Laterality Date  . BIOPSY  05/04/2015   Procedure: BIOPSY;  Surgeon: West Bali, MD;  [facility];  Service: Endoscopy;;  bx's of ileocecal valve   . BRONCHIAL BRUSHINGS  04/29/2019   Procedure: BRONCHIAL BRUSHINGS;  Surgeon: Josephine Igo, DO;  [facility];  Service: Pulmonary;;  . BRONCHIAL NEEDLE ASPIRATION BIOPSY  04/29/2019   Procedure: BRONCHIAL NEEDLE ASPIRATION BIOPSIES;  Surgeon: Josephine Igo, DO;  [facility];  Service: Pulmonary;;  . BRONCHIAL WASHINGS  04/29/2019   Procedure: BRONCHIAL WASHINGS;  Surgeon: Josephine Igo, DO;  [facility];  Service: Pulmonary;;  . COLONOSCOPY WITH PROPOFOL N/A 05/04/2015   Dr. Darrick Penna: normal appearing ileum with prominent IC valve with tubular adenomas, moderate diverticulosis in sigmoid colon, ascending colon, and retum. Moderate sized  internal hemorrhoids. Surveillance in 5 years  . ELECTROMAGNETIC NAVIGATION BROCHOSCOPY  04/29/2019   Procedure: NAVIGATION BRONCHOSCOPY;  Surgeon: Josephine Igo, DO;  [facility];  Service: Pulmonary;;  . ESOPHAGOGASTRODUODENOSCOPY (EGD) WITH PROPOFOL N/A 12/19/2016   Procedure: ESOPHAGOGASTRODUODENOSCOPY (EGD) WITH PROPOFOL;  Surgeon: West Bali, MD;  [facility];  Service: Endoscopy;  Laterality: N/A;  11:30am  . FLEXIBLE SIGMOIDOSCOPY N/A 12/10/2015   hemorrhoid banding X 3   . HEMORRHOID BANDING N/A 12/10/2015   Procedure: HEMORRHOID BANDING;  Surgeon: West Bali, MD;  [facility];  Service: Endoscopy;  Laterality: N/A;  1:30 PM  . INSERTION OF SUPRAPUBIC CATHETER N/A 02/24/2019   Procedure: INSERTION OF SUPRAPUBIC CATHETER;  Surgeon: Ihor Gully, MD;  [facility];  Service: Urology;  Laterality: N/A;  . IR CM INJ ANY COLONIC TUBE W/FLUORO  10/30/2018  . IR GASTROSTOMY TUBE MOD SED  10/04/2018  . IR GASTROSTOMY TUBE REMOVAL  10/08/2019  . IR IMAGING GUIDED PORT INSERTION  10/04/2018  . IR REMOVAL TUN ACCESS W/ PORT W/O FL MOD SED  10/14/2018  . IR REPLACE G-TUBE SIMPLE WO FLUORO  11/03/2018  . IR REPLACE G-TUBE SIMPLE WO FLUORO  07/28/2019  . LAPAROSCOPIC INSERTION GASTROSTOMY TUBE Left 10/07/2018   Procedure: LAPAROSCOPIC  GASTROSTOMY TUBE;  Surgeon: Kinsinger, De Blanch, MD;  [facility];  Service: General;  Laterality: Left;  Marland Kitchen MICROLARYNGOSCOPY N/A 09/27/2018  Procedure: MICRO DIRECT LARYNGOSCOPY WITH BIOPSY;  Surgeon: Newman Pies, MD;  Location: [facility] OR;  Service: ENT;  Laterality: N/A;  . None to date     As of 04/14/15  . POLYPECTOMY  05/04/2015   Procedure: POLYPECTOMY;  Surgeon: West Bali, MD;  [facility];  Service: Endoscopy;;  descending colon polyp, ascending colon polyp  . SAVORY DILATION N/A 12/19/2016   Procedure: SAVORY DILATION;  Surgeon: West Bali, MD;  [facility];  Service: Endoscopy;  Laterality:  N/A;  . TRACHEOSTOMY TUBE PLACEMENT N/A 09/27/2018   Procedure: AWAKE TRACHEOSTOMY;  Surgeon: Newman Pies, MD;  [facility];  Service: ENT;  Laterality: N/A;  . TRANSURETHRAL RESECTION OF PROSTATE N/A 02/24/2019   Procedure: TRANSURETHRAL RESECTION OF THE PROSTATE (TURP);  Surgeon: Ihor Gully, MD;  [facility];  Service: Urology;  Laterality: N/A;  . VIDEO BRONCHOSCOPY WITH ENDOBRONCHIAL NAVIGATION N/A 04/29/2019   Procedure: VIDEO BRONCHOSCOPY;  Surgeon: Josephine Igo, DO;  [facility];  Service: Pulmonary;  Laterality: N/A;    There were no vitals filed for this visit.   Subjective Assessment - 11/14/19 1054    Subjective Pt is doing his self massage and wearing his compression for 2 hr s now    Pertinent History History of squamous cell carcinoma of the head and neck. Patient underwent PEG tube placement, tracheostomy tube placement and subsequently completed 7 weeks of radiation to the head and neck.  Recent PET findings of Left lung posible metastasis and Rt lung SCC.  Pt underwent radiation to both sides for the lungs.  Other health history unremarkable per pt.    Patient Stated Goals swallow better    Currently in Pain? No/denies    Pain Onset More than a month ago              [facility] PT Assessment - 11/14/19 0001      Assessment   Medical Diagnosis head and neck and lung cancer    Referring Provider (PT) Dr. Basilio Cairo    Onset Date/Surgical Date 09/28/18    Hand Dominance Left    Next MD Visit September    Prior Therapy no      Precautions   Precaution Comments PEG tube, radiation history to the neck and lungs bilaterally       Restrictions   Weight Bearing Restrictions No      Home Environment   Living Environment Private residence    Living Arrangements Spouse/significant other    Available Help at Discharge Family      Prior Function   Level of Independence Independent    Vocation Retired    Leisure None      Observation/Other Assessments    Observations pt stands and ambulates with bil knee flexion and lumbar flexion with use of RW      Posture/Postural Control   Posture/Postural Control Postural limitations    Postural Limitations Rounded Shoulders;Forward head      ROM / Strength   AROM / PROM / Strength AROM      AROM   AROM Assessment Site Cervical    Cervical Flexion 40    Cervical Extension 45    Cervical - Right Side Bend 15    Cervical - Left Side Bend 12    Cervical - Right Rotation 50    Cervical - Left Rotation 45  [facility] Adult PT Treatment/Exercise - 11/14/19 0001      Exercises   Exercises Neck      Neck Exercises: Seated   Other Seated Exercise cervical ROM and scapular retraction.x 10       Manual Therapy   Manual Therapy Manual Lymphatic Drainage (MLD)    Manual therapy comments done seperate from all other aspects of treatment    Manual Lymphatic Drainage (MLD) chest, head and neck                   PT Education - 11/14/19 1138    Education Details cervical ROM as well as self manual for neck    Person(s) Educated Patient    Methods Explanation;Handout    Comprehension Verbalized understanding;Returned demonstration            PT Short Term Goals - 11/14/19 1141      PT SHORT TERM GOAL #1   Title Pt to be I with self manual techniques to increase lymph circulation .    Time 2    Period Weeks    Status On-going    Target Date 11/24/19      PT SHORT TERM GOAL #2   Title PT to be using compression bandaging 4 hrs per day to reduce edema in pt neck .    Time 2    Period Weeks    Status On-going             PT Long Term Goals - 11/14/19 1141      PT LONG TERM GOAL #1   Title PT to have no measurable edema in face or neck area.    Time 4    Period Weeks    Status On-going      PT LONG TERM GOAL #2   Title PT to verbalize the need to get a referral to therapy if he notices increased head and neck edema and is not able to  control it on his own.    Time 4    Period Weeks    Status On-going                 Plan - 11/14/19 1139    Clinical Impression Statement PT cervical ROM measured and recorded, no swelling of tongue or cheek.  PT instructed in ROM for cervical spine as well as self manual for cevical area.    Personal Factors and Comorbidities Age;Fitness;Comorbidity 2    Comorbidities radiation history, unknown cancer status    Examination-Activity Limitations Other   swallowing   Examination-Participation Restrictions Other   eating   Stability/Clinical Decision Making Stable/Uncomplicated    Rehab Potential Good    PT Frequency 1x / week    PT Duration 4 weeks    PT Treatment/Interventions Therapeutic exercise;Manual techniques;Traction;Manual lymph drainage;Patient/family education;Compression bandaging;Scar mobilization    PT Next Visit Plan Pt to be educated in self manual for facial area and measured.    PT Home Exercise Plan 11/06/19: sit to stand, ab brace    Consulted and Agree with Plan of Care Patient;Family member/caregiver    Family Member Consulted wife           Patient will benefit from skilled therapeutic intervention in order to improve the following deficits and impairments:  Pain, Other (comment), Increased edema  Visit Diagnosis: Lymphedema, not elsewhere classified     Problem List Patient Active Problem List   Diagnosis Date Noted  . Malignant neoplasm of right upper lobe  of lung (HCC) 05/27/2019  . Primary cancer of left upper lobe of lung (HCC) 05/27/2019  . Pseudomonas aeruginosa infection 05/21/2019  . Tracheobronchitis 05/21/2019  . Hx of radiation therapy 05/21/2019  . 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HCC) 05/21/2019  . Squamous cell lung cancer, right (HCC) 05/21/2019  . Adenocarcinoma, lung, left (HCC) 05/21/2019  . Lung nodule 04/23/2019  . Ground glass opacity present on imaging of lung 04/23/2019  . Head and neck cancer (HCC) 04/23/2019  .  Tracheostomy tube present (HCC) 04/23/2019  . PEG (percutaneous endoscopic gastrostomy) adjustment/replacement/removal (HCC) 03/08/2019  . 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 Pleural effusion   . 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HCC)   . Bacteremia   . Leukocytosis   . Normocytic anemia   . Protein-calorie malnutrition, severe 10/08/2018  . PSVT (paroxysmal supraventricular tachycardia) (HCC) 10/03/2018  . Atrial fibrillation (HCC) 10/03/2018  . Goals of care, counseling/discussion   . Dysphagia   . Loss of weight   . 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 Gastritis determined by endoscopy   . Esophageal dysphagia   . 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 Globus sensation 12/12/2016  . Smokes with greater than 40 pack year history 10/20/2016  . Hemorrhoids, internal, with bleeding 10/06/2015  . History of colonic polyps 04/14/2015  . Vitamin D deficiency 10/09/2014  . HLD (hyperlipidemia) 10/08/2012  . GERD (gastroesophageal reflux disease) 10/08/2012  . HTN (hypertension)   . Diverticulitis 10/25/2009   Virgina Organ, PT CLT [phone] 11/14/2019, 11:42 AM  [facility] [address] [place], [place], [address] Phone: [phone]   Fax:  [phone]  Name: Logan Price MRN: [id number] Date of Birth: April 05, 1945

## 2019-11-17 ENCOUNTER — Other Ambulatory Visit: Payer: Self-pay

## 2019-11-17 ENCOUNTER — Ambulatory Visit (HOSPITAL_COMMUNITY): Payer: Medicare Other | Admitting: Physical Therapy

## 2019-11-17 DIAGNOSIS — I890 Lymphedema, not elsewhere classified: Secondary | ICD-10-CM | POA: Diagnosis not present

## 2019-11-17 DIAGNOSIS — R2689 Other abnormalities of gait and mobility: Secondary | ICD-10-CM | POA: Diagnosis not present

## 2019-11-17 DIAGNOSIS — G8929 Other chronic pain: Secondary | ICD-10-CM | POA: Diagnosis not present

## 2019-11-17 DIAGNOSIS — M6281 Muscle weakness (generalized): Secondary | ICD-10-CM

## 2019-11-17 DIAGNOSIS — M545 Low back pain: Secondary | ICD-10-CM | POA: Diagnosis not present

## 2019-11-17 NOTE — Therapy (Signed)
hyperlipidemia) 10/08/2012  . GERD (gastroesophageal reflux disease) 10/08/2012  . HTN  (hypertension)   . Diverticulitis 10/25/2009   Teena Irani, PTA/CLT [phone]  Teena Irani 11/17/2019, 2:49 PM  [facility] [address] [place], [name], [address] Phone: [phone]   Fax:  [phone]  Name: TRASEAN DELIMA MRN: [id number] Date of Birth: 1946-01-28  [place], [name], [address] Phone: [phone]   Fax:  [phone]  Physical Therapy Treatment  Patient Details  Name: TOPHER BUENAVENTURA MRN: [id number] Date of Birth: [birth year] Referring Provider (PT): Dr. Isidore Moos   Encounter Date: 11/17/2019   PT End of Session - 11/17/19 1440    Visit Number 3    Number of Visits 8    Date for PT Re-Evaluation 12/05/19    Authorization Type Medicare/ Generic commercial secondary    Progress Note Due on Visit 8    PT Start Time 1400    PT Stop Time 1442    PT Time Calculation (min) 42 min    Equipment Utilized During Treatment --   [facility]   Activity Tolerance Patient tolerated treatment well    Behavior During Therapy [facility] for tasks assessed/performed           Past Medical History:  Diagnosis Date  . Diverticulitis   . Dysrhythmia 09/2018   episode of SVT while in hospital  . 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 GERD (gastroesophageal reflux disease)   . 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 HTN (hypertension)   . Hyperlipidemia     Past Surgical History:  Procedure Laterality Date  . BIOPSY  05/04/2015   Procedure: BIOPSY;  Surgeon: Danie Binder, MD;  [facility];  Service: Endoscopy;;  bx's of ileocecal valve   . BRONCHIAL BRUSHINGS  04/29/2019   Procedure: BRONCHIAL BRUSHINGS;  Surgeon: Garner Nash, DO;  Location: [place];  Service: Pulmonary;;  . BRONCHIAL NEEDLE ASPIRATION BIOPSY  04/29/2019   Procedure: BRONCHIAL NEEDLE ASPIRATION BIOPSIES;  Surgeon: Garner Nash, DO;  Location: [place];  Service: Pulmonary;;  . BRONCHIAL WASHINGS  04/29/2019   Procedure: BRONCHIAL WASHINGS;  Surgeon: Garner Nash, DO;  [facility];  Service: Pulmonary;;  . COLONOSCOPY WITH PROPOFOL N/A 05/04/2015   Dr. Oneida Alar: normal appearing ileum with prominent IC valve with tubular adenomas, moderate diverticulosis in sigmoid colon,  ascending colon, and retum. Moderate sized internal hemorrhoids. Surveillance in 5 years  . ELECTROMAGNETIC NAVIGATION BROCHOSCOPY  04/29/2019   Procedure: NAVIGATION BRONCHOSCOPY;  Surgeon: Garner Nash, DO;  Location: [facility];  Service: Pulmonary;;  . ESOPHAGOGASTRODUODENOSCOPY (EGD) WITH PROPOFOL N/A 12/19/2016   Procedure: ESOPHAGOGASTRODUODENOSCOPY (EGD) WITH PROPOFOL;  Surgeon: Danie Binder, MD;  [facility];  Service: Endoscopy;  Laterality: N/A;  11:30am  . FLEXIBLE SIGMOIDOSCOPY N/A 12/10/2015   hemorrhoid banding X 3   . HEMORRHOID BANDING N/A 12/10/2015   Procedure: HEMORRHOID BANDING;  Surgeon: Danie Binder, MD;  [facility];  Service: Endoscopy;  Laterality: N/A;  1:30 PM  . INSERTION OF SUPRAPUBIC CATHETER N/A 02/24/2019   Procedure: INSERTION OF SUPRAPUBIC CATHETER;  Surgeon: Kathie Rhodes, MD;  [facility];  Service: Urology;  Laterality: N/A;  . IR CM INJ ANY COLONIC TUBE W/FLUORO  10/30/2018  . IR GASTROSTOMY TUBE MOD SED  10/04/2018  . IR GASTROSTOMY TUBE REMOVAL  10/08/2019  . IR IMAGING GUIDED PORT INSERTION  10/04/2018  . IR REMOVAL TUN ACCESS W/ PORT W/O FL MOD SED  10/14/2018  . IR REPLACE G-TUBE SIMPLE WO FLUORO  11/03/2018  . IR REPLACE G-TUBE SIMPLE WO FLUORO  07/28/2019  . LAPAROSCOPIC INSERTION GASTROSTOMY TUBE Left 10/07/2018   Procedure: LAPAROSCOPIC  GASTROSTOMY TUBE;  Surgeon: Kieth Brightly Arta Bruce, MD;  Location: [place];  Service:  hyperlipidemia) 10/08/2012  . GERD (gastroesophageal reflux disease) 10/08/2012  . HTN  (hypertension)   . Diverticulitis 10/25/2009   Teena Irani, PTA/CLT [phone]  Teena Irani 11/17/2019, 2:49 PM  [facility] [address] [place], [name], [address] Phone: [phone]   Fax:  [phone]  Name: TRASEAN DELIMA MRN: [id number] Date of Birth: 1946-01-28  hyperlipidemia) 10/08/2012  . GERD (gastroesophageal reflux disease) 10/08/2012  . HTN  (hypertension)   . Diverticulitis 10/25/2009   Teena Irani, PTA/CLT [phone]  Teena Irani 11/17/2019, 2:49 PM  [facility] [address] [place], [name], [address] Phone: [phone]   Fax:  [phone]  Name: TRASEAN DELIMA MRN: [id number] Date of Birth: 1946-01-28

## 2019-11-19 ENCOUNTER — Encounter (HOSPITAL_COMMUNITY): Payer: Self-pay

## 2019-11-19 ENCOUNTER — Other Ambulatory Visit: Payer: Self-pay

## 2019-11-19 ENCOUNTER — Ambulatory Visit (HOSPITAL_COMMUNITY): Payer: Medicare Other

## 2019-11-19 DIAGNOSIS — R2689 Other abnormalities of gait and mobility: Secondary | ICD-10-CM

## 2019-11-19 DIAGNOSIS — M545 Low back pain: Secondary | ICD-10-CM | POA: Diagnosis not present

## 2019-11-19 DIAGNOSIS — M6281 Muscle weakness (generalized): Secondary | ICD-10-CM | POA: Diagnosis not present

## 2019-11-19 DIAGNOSIS — I890 Lymphedema, not elsewhere classified: Secondary | ICD-10-CM | POA: Diagnosis not present

## 2019-11-19 DIAGNOSIS — G8929 Other chronic pain: Secondary | ICD-10-CM | POA: Diagnosis not present

## 2019-11-19 NOTE — Therapy (Signed)
[place], [name], [address] Phone: [phone]   Fax:  [phone]  Physical Therapy Treatment  Patient Details  Name: Logan Price MRN: [id number] Date of Birth: Oct 24, 1945 Referring Provider (PT): Dr. Isidore Moos   Encounter Date: 11/19/2019   PT End of Session - 11/19/19 1424    Visit Number 4    Number of Visits 8    Date for PT Re-Evaluation 12/05/19    Authorization Type Medicare/ Generic commercial secondary    Progress Note Due on Visit 8    PT Start Time 1353    PT Stop Time 1435    PT Time Calculation (min) 42 min    Equipment Utilized During Treatment --   SPC/ CGA with no AD   Activity Tolerance Patient tolerated treatment well    Behavior During Therapy [facility] for tasks assessed/performed           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   HTN (hypertension)    Hyperlipidemia     Past Surgical History:  Procedure Laterality Date   BIOPSY  05/04/2015   Procedure: BIOPSY;  Surgeon: Danie Binder, MD;  [facility];  Service: Endoscopy;;  bx's of ileocecal valve    BRONCHIAL BRUSHINGS  04/29/2019   Procedure: BRONCHIAL BRUSHINGS;  Surgeon: Garner Nash, DO;  Location: [facility];  Service: Pulmonary;;   BRONCHIAL NEEDLE ASPIRATION BIOPSY  04/29/2019   Procedure: BRONCHIAL NEEDLE ASPIRATION BIOPSIES;  Surgeon: Garner Nash, DO;  Location: [facility];  Service: Pulmonary;;   BRONCHIAL WASHINGS  04/29/2019   Procedure: BRONCHIAL WASHINGS;  Surgeon: Garner Nash, DO;  [facility];  Service: Pulmonary;;   COLONOSCOPY WITH PROPOFOL N/A 05/04/2015   Dr. Oneida Alar: normal appearing ileum with prominent IC valve with tubular adenomas, moderate diverticulosis in  sigmoid colon, ascending colon, and retum. Moderate sized internal hemorrhoids. Surveillance in 5 years   ELECTROMAGNETIC NAVIGATION BROCHOSCOPY  04/29/2019   Procedure: NAVIGATION BRONCHOSCOPY;  Surgeon: Garner Nash, DO;  Location: [facility];  Service: Pulmonary;;   ESOPHAGOGASTRODUODENOSCOPY (EGD) WITH PROPOFOL N/A 12/19/2016   Procedure: ESOPHAGOGASTRODUODENOSCOPY (EGD) WITH PROPOFOL;  Surgeon: Danie Binder, MD;  [facility];  Service: Endoscopy;  Laterality: N/A;  11:30am   FLEXIBLE SIGMOIDOSCOPY N/A 12/10/2015   hemorrhoid banding X 3    HEMORRHOID BANDING N/A 12/10/2015   Procedure: HEMORRHOID BANDING;  Surgeon: Danie Binder, MD;  [facility];  Service: Endoscopy;  Laterality: N/A;  1:30 PM   INSERTION OF SUPRAPUBIC CATHETER N/A 02/24/2019   Procedure: INSERTION OF SUPRAPUBIC CATHETER;  Surgeon: Kathie Rhodes, MD;  [facility];  Service: Urology;  Laterality: N/A;   IR CM INJ ANY COLONIC TUBE W/FLUORO  10/30/2018   IR GASTROSTOMY TUBE MOD SED  10/04/2018   IR GASTROSTOMY TUBE REMOVAL  10/08/2019   IR IMAGING GUIDED PORT INSERTION  10/04/2018   IR REMOVAL TUN ACCESS W/ PORT W/O FL MOD SED  10/14/2018   IR REPLACE G-TUBE SIMPLE WO FLUORO  11/03/2018   IR REPLACE G-TUBE SIMPLE WO FLUORO  07/28/2019   LAPAROSCOPIC INSERTION GASTROSTOMY TUBE Left 10/07/2018   Procedure: LAPAROSCOPIC  GASTROSTOMY TUBE;  Surgeon: Mickeal Skinner, MD;  Location:  05/27/2019   Primary cancer of left upper lobe of lung ([place]) 05/27/2019   Pseudomonas aeruginosa infection 05/21/2019   Tracheobronchitis 05/21/2019   Hx of radiation therapy 05/21/2019   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   Squamous cell lung cancer, 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   Adenocarcinoma, lung, left ([place]) 05/21/2019   Lung nodule 04/23/2019   Ground glass opacity present on imaging of lung 04/23/2019   Head and neck cancer ([place]) 04/23/2019   Tracheostomy tube present ([place]) 04/23/2019   PEG (percutaneous endoscopic gastrostomy)  adjustment/replacement/removal ([place]) 03/08/2019   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   Pleural effusion    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[facility])    Bacteremia    Leukocytosis    Normocytic anemia    Protein-calorie malnutrition, severe 10/08/2018   PSVT (paroxysmal supraventricular tachycardia) ([place]) 10/03/2018   Atrial fibrillation ([place]) 10/03/2018   Goals of care, counseling/discussion    Dysphagia    Loss of weight    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   Gastritis determined by endoscopy    Esophageal dysphagia    False positive serological test for hepatitis C 12/13/2016   Globus sensation 12/12/2016   Smokes with greater than 40 pack year history 10/20/2016   Hemorrhoids, internal, with bleeding 10/06/2015   History of colonic polyps 04/14/2015   Vitamin D deficiency 10/09/2014   HLD (hyperlipidemia) 10/08/2012   GERD (gastroesophageal reflux disease) 10/08/2012   HTN (hypertension)    Diverticulitis 10/25/2009   Ihor Austin, LPTA/CLT; CBIS [phone]  [name] 11/19/2019, 2:42 PM  [facility] [address] [place], [name], [address] Phone: [phone]   Fax:  [phone]  Name: Logan Price MRN: [id number] Date of Birth: April 26, 1945  05/27/2019   Primary cancer of left upper lobe of lung ([place]) 05/27/2019   Pseudomonas aeruginosa infection 05/21/2019   Tracheobronchitis 05/21/2019   Hx of radiation therapy 05/21/2019   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   Squamous cell lung cancer, 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   Adenocarcinoma, lung, left ([place]) 05/21/2019   Lung nodule 04/23/2019   Ground glass opacity present on imaging of lung 04/23/2019   Head and neck cancer ([place]) 04/23/2019   Tracheostomy tube present ([place]) 04/23/2019   PEG (percutaneous endoscopic gastrostomy)  adjustment/replacement/removal ([place]) 03/08/2019   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   Pleural effusion    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[facility])    Bacteremia    Leukocytosis    Normocytic anemia    Protein-calorie malnutrition, severe 10/08/2018   PSVT (paroxysmal supraventricular tachycardia) ([place]) 10/03/2018   Atrial fibrillation ([place]) 10/03/2018   Goals of care, counseling/discussion    Dysphagia    Loss of weight    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   Gastritis determined by endoscopy    Esophageal dysphagia    False positive serological test for hepatitis C 12/13/2016   Globus sensation 12/12/2016   Smokes with greater than 40 pack year history 10/20/2016   Hemorrhoids, internal, with bleeding 10/06/2015   History of colonic polyps 04/14/2015   Vitamin D deficiency 10/09/2014   HLD (hyperlipidemia) 10/08/2012   GERD (gastroesophageal reflux disease) 10/08/2012   HTN (hypertension)    Diverticulitis 10/25/2009   Ihor Austin, LPTA/CLT; CBIS [phone]  [name] 11/19/2019, 2:42 PM  [facility] [address] [place], [name], [address] Phone: [phone]   Fax:  [phone]  Name: Logan Price MRN: [id number] Date of Birth: April 26, 1945  [place], [name], [address] Phone: [phone]   Fax:  [phone]  Physical Therapy Treatment  Patient Details  Name: Logan Price MRN: [id number] Date of Birth: Oct 24, 1945 Referring Provider (PT): Dr. Isidore Moos   Encounter Date: 11/19/2019   PT End of Session - 11/19/19 1424    Visit Number 4    Number of Visits 8    Date for PT Re-Evaluation 12/05/19    Authorization Type Medicare/ Generic commercial secondary    Progress Note Due on Visit 8    PT Start Time 1353    PT Stop Time 1435    PT Time Calculation (min) 42 min    Equipment Utilized During Treatment --   SPC/ CGA with no AD   Activity Tolerance Patient tolerated treatment well    Behavior During Therapy [facility] for tasks assessed/performed           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   HTN (hypertension)    Hyperlipidemia     Past Surgical History:  Procedure Laterality Date   BIOPSY  05/04/2015   Procedure: BIOPSY;  Surgeon: Danie Binder, MD;  [facility];  Service: Endoscopy;;  bx's of ileocecal valve    BRONCHIAL BRUSHINGS  04/29/2019   Procedure: BRONCHIAL BRUSHINGS;  Surgeon: Garner Nash, DO;  Location: [facility];  Service: Pulmonary;;   BRONCHIAL NEEDLE ASPIRATION BIOPSY  04/29/2019   Procedure: BRONCHIAL NEEDLE ASPIRATION BIOPSIES;  Surgeon: Garner Nash, DO;  Location: [facility];  Service: Pulmonary;;   BRONCHIAL WASHINGS  04/29/2019   Procedure: BRONCHIAL WASHINGS;  Surgeon: Garner Nash, DO;  [facility];  Service: Pulmonary;;   COLONOSCOPY WITH PROPOFOL N/A 05/04/2015   Dr. Oneida Alar: normal appearing ileum with prominent IC valve with tubular adenomas, moderate diverticulosis in  sigmoid colon, ascending colon, and retum. Moderate sized internal hemorrhoids. Surveillance in 5 years   ELECTROMAGNETIC NAVIGATION BROCHOSCOPY  04/29/2019   Procedure: NAVIGATION BRONCHOSCOPY;  Surgeon: Garner Nash, DO;  Location: [facility];  Service: Pulmonary;;   ESOPHAGOGASTRODUODENOSCOPY (EGD) WITH PROPOFOL N/A 12/19/2016   Procedure: ESOPHAGOGASTRODUODENOSCOPY (EGD) WITH PROPOFOL;  Surgeon: Danie Binder, MD;  [facility];  Service: Endoscopy;  Laterality: N/A;  11:30am   FLEXIBLE SIGMOIDOSCOPY N/A 12/10/2015   hemorrhoid banding X 3    HEMORRHOID BANDING N/A 12/10/2015   Procedure: HEMORRHOID BANDING;  Surgeon: Danie Binder, MD;  [facility];  Service: Endoscopy;  Laterality: N/A;  1:30 PM   INSERTION OF SUPRAPUBIC CATHETER N/A 02/24/2019   Procedure: INSERTION OF SUPRAPUBIC CATHETER;  Surgeon: Kathie Rhodes, MD;  [facility];  Service: Urology;  Laterality: N/A;   IR CM INJ ANY COLONIC TUBE W/FLUORO  10/30/2018   IR GASTROSTOMY TUBE MOD SED  10/04/2018   IR GASTROSTOMY TUBE REMOVAL  10/08/2019   IR IMAGING GUIDED PORT INSERTION  10/04/2018   IR REMOVAL TUN ACCESS W/ PORT W/O FL MOD SED  10/14/2018   IR REPLACE G-TUBE SIMPLE WO FLUORO  11/03/2018   IR REPLACE G-TUBE SIMPLE WO FLUORO  07/28/2019   LAPAROSCOPIC INSERTION GASTROSTOMY TUBE Left 10/07/2018   Procedure: LAPAROSCOPIC  GASTROSTOMY TUBE;  Surgeon: Mickeal Skinner, MD;  Location:

## 2019-11-21 ENCOUNTER — Encounter (HOSPITAL_COMMUNITY): Payer: PRIVATE HEALTH INSURANCE

## 2019-11-24 ENCOUNTER — Other Ambulatory Visit: Payer: Self-pay

## 2019-11-24 ENCOUNTER — Ambulatory Visit (HOSPITAL_COMMUNITY): Payer: Medicare Other | Admitting: Physical Therapy

## 2019-11-24 DIAGNOSIS — M545 Low back pain, unspecified: Secondary | ICD-10-CM

## 2019-11-24 DIAGNOSIS — G8929 Other chronic pain: Secondary | ICD-10-CM | POA: Diagnosis not present

## 2019-11-24 DIAGNOSIS — I890 Lymphedema, not elsewhere classified: Secondary | ICD-10-CM | POA: Diagnosis not present

## 2019-11-24 DIAGNOSIS — R2689 Other abnormalities of gait and mobility: Secondary | ICD-10-CM | POA: Diagnosis not present

## 2019-11-24 DIAGNOSIS — M6281 Muscle weakness (generalized): Secondary | ICD-10-CM | POA: Diagnosis not present

## 2019-11-24 NOTE — Therapy (Signed)
[place], [name], [address] Phone: [phone]   Fax:  [phone]  Physical Therapy Treatment  Patient Details  Name: Logan Price MRN: [id number] Date of Birth: 1946/02/11 Referring Provider (PT): Dr. Isidore Moos   Encounter Date: 11/24/2019   PT End of Session - 11/24/19 1437    Visit Number 5    Number of Visits 8    Date for PT Re-Evaluation 12/05/19    Authorization Type Medicare/ Generic commercial secondary    Progress Note Due on Visit 8    PT Start Time 1318    PT Stop Time 1402    PT Time Calculation (min) 44 min    Equipment Utilized During Treatment --   SPC/ CGA with no AD   Activity Tolerance Patient tolerated treatment well    Behavior During Therapy [facility] for tasks assessed/performed           Past Medical History:  Diagnosis Date  . Diverticulitis   . Dysrhythmia 09/2018   episode of SVT while in hospital  . 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 GERD (gastroesophageal reflux disease)   . 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 HTN (hypertension)   . Hyperlipidemia     Past Surgical History:  Procedure Laterality Date  . BIOPSY  05/04/2015   Procedure: BIOPSY;  Surgeon: Danie Binder, MD;  [facility];  Service: Endoscopy;;  bx's of ileocecal valve   . BRONCHIAL BRUSHINGS  04/29/2019   Procedure: BRONCHIAL BRUSHINGS;  Surgeon: Garner Nash, DO;  Location: [facility];  Service: Pulmonary;;  . BRONCHIAL NEEDLE ASPIRATION BIOPSY  04/29/2019   Procedure: BRONCHIAL NEEDLE ASPIRATION BIOPSIES;  Surgeon: Garner Nash, DO;  Location: [facility];  Service: Pulmonary;;  . BRONCHIAL WASHINGS  04/29/2019   Procedure: BRONCHIAL WASHINGS;  Surgeon: Garner Nash, DO;  [facility];  Service: Pulmonary;;  . COLONOSCOPY WITH PROPOFOL N/A 05/04/2015   Dr. Oneida Alar: normal appearing ileum with prominent IC valve with tubular adenomas, moderate diverticulosis in  sigmoid colon, ascending colon, and retum. Moderate sized internal hemorrhoids. Surveillance in 5 years  . ELECTROMAGNETIC NAVIGATION BROCHOSCOPY  04/29/2019   Procedure: NAVIGATION BRONCHOSCOPY;  Surgeon: Garner Nash, DO;  Location: [facility];  Service: Pulmonary;;  . ESOPHAGOGASTRODUODENOSCOPY (EGD) WITH PROPOFOL N/A 12/19/2016   Procedure: ESOPHAGOGASTRODUODENOSCOPY (EGD) WITH PROPOFOL;  Surgeon: Danie Binder, MD;  [facility];  Service: Endoscopy;  Laterality: N/A;  11:30am  . FLEXIBLE SIGMOIDOSCOPY N/A 12/10/2015   hemorrhoid banding X 3   . HEMORRHOID BANDING N/A 12/10/2015   Procedure: HEMORRHOID BANDING;  Surgeon: Danie Binder, MD;  [facility];  Service: Endoscopy;  Laterality: N/A;  1:30 PM  . INSERTION OF SUPRAPUBIC CATHETER N/A 02/24/2019   Procedure: INSERTION OF SUPRAPUBIC CATHETER;  Surgeon: Kathie Rhodes, MD;  [facility];  Service: Urology;  Laterality: N/A;  . IR CM INJ ANY COLONIC TUBE W/FLUORO  10/30/2018  . IR GASTROSTOMY TUBE MOD SED  10/04/2018  . IR GASTROSTOMY TUBE REMOVAL  10/08/2019  . IR IMAGING GUIDED PORT INSERTION  10/04/2018  . IR REMOVAL TUN ACCESS W/ PORT W/O FL MOD SED  10/14/2018  . IR REPLACE G-TUBE SIMPLE WO FLUORO  11/03/2018  . IR REPLACE G-TUBE SIMPLE WO FLUORO  07/28/2019  . LAPAROSCOPIC INSERTION GASTROSTOMY TUBE Left 10/07/2018   Procedure: LAPAROSCOPIC  GASTROSTOMY TUBE;  Surgeon: [place] Arta Bruce, MD;  Location:  [place], [name], [address] Phone: [phone]   Fax:  [phone]  Physical Therapy Treatment  Patient Details  Name: Logan Price MRN: [id number] Date of Birth: 1946/02/11 Referring Provider (PT): Dr. Isidore Moos   Encounter Date: 11/24/2019   PT End of Session - 11/24/19 1437    Visit Number 5    Number of Visits 8    Date for PT Re-Evaluation 12/05/19    Authorization Type Medicare/ Generic commercial secondary    Progress Note Due on Visit 8    PT Start Time 1318    PT Stop Time 1402    PT Time Calculation (min) 44 min    Equipment Utilized During Treatment --   SPC/ CGA with no AD   Activity Tolerance Patient tolerated treatment well    Behavior During Therapy [facility] for tasks assessed/performed           Past Medical History:  Diagnosis Date  . Diverticulitis   . Dysrhythmia 09/2018   episode of SVT while in hospital  . 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 GERD (gastroesophageal reflux disease)   . 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 HTN (hypertension)   . Hyperlipidemia     Past Surgical History:  Procedure Laterality Date  . BIOPSY  05/04/2015   Procedure: BIOPSY;  Surgeon: Danie Binder, MD;  [facility];  Service: Endoscopy;;  bx's of ileocecal valve   . BRONCHIAL BRUSHINGS  04/29/2019   Procedure: BRONCHIAL BRUSHINGS;  Surgeon: Garner Nash, DO;  Location: [facility];  Service: Pulmonary;;  . BRONCHIAL NEEDLE ASPIRATION BIOPSY  04/29/2019   Procedure: BRONCHIAL NEEDLE ASPIRATION BIOPSIES;  Surgeon: Garner Nash, DO;  Location: [facility];  Service: Pulmonary;;  . BRONCHIAL WASHINGS  04/29/2019   Procedure: BRONCHIAL WASHINGS;  Surgeon: Garner Nash, DO;  [facility];  Service: Pulmonary;;  . COLONOSCOPY WITH PROPOFOL N/A 05/04/2015   Dr. Oneida Alar: normal appearing ileum with prominent IC valve with tubular adenomas, moderate diverticulosis in  sigmoid colon, ascending colon, and retum. Moderate sized internal hemorrhoids. Surveillance in 5 years  . ELECTROMAGNETIC NAVIGATION BROCHOSCOPY  04/29/2019   Procedure: NAVIGATION BRONCHOSCOPY;  Surgeon: Garner Nash, DO;  Location: [facility];  Service: Pulmonary;;  . ESOPHAGOGASTRODUODENOSCOPY (EGD) WITH PROPOFOL N/A 12/19/2016   Procedure: ESOPHAGOGASTRODUODENOSCOPY (EGD) WITH PROPOFOL;  Surgeon: Danie Binder, MD;  [facility];  Service: Endoscopy;  Laterality: N/A;  11:30am  . FLEXIBLE SIGMOIDOSCOPY N/A 12/10/2015   hemorrhoid banding X 3   . HEMORRHOID BANDING N/A 12/10/2015   Procedure: HEMORRHOID BANDING;  Surgeon: Danie Binder, MD;  [facility];  Service: Endoscopy;  Laterality: N/A;  1:30 PM  . INSERTION OF SUPRAPUBIC CATHETER N/A 02/24/2019   Procedure: INSERTION OF SUPRAPUBIC CATHETER;  Surgeon: Kathie Rhodes, MD;  [facility];  Service: Urology;  Laterality: N/A;  . IR CM INJ ANY COLONIC TUBE W/FLUORO  10/30/2018  . IR GASTROSTOMY TUBE MOD SED  10/04/2018  . IR GASTROSTOMY TUBE REMOVAL  10/08/2019  . IR IMAGING GUIDED PORT INSERTION  10/04/2018  . IR REMOVAL TUN ACCESS W/ PORT W/O FL MOD SED  10/14/2018  . IR REPLACE G-TUBE SIMPLE WO FLUORO  11/03/2018  . IR REPLACE G-TUBE SIMPLE WO FLUORO  07/28/2019  . LAPAROSCOPIC INSERTION GASTROSTOMY TUBE Left 10/07/2018   Procedure: LAPAROSCOPIC  GASTROSTOMY TUBE;  Surgeon: [place] Bright[place] Arta Bruce, MD;  Location:  Title PT to verbalize the need to get a referral to therapy if he notices increased head and neck edema and is not able to control it on his own.    Time 4    Period Weeks    Status On-going                 Plan - 11/24/19 1437    Clinical Impression Statement Continued with exercise progression.  Able to increase most repetitions to 15 today and pt also able to complete full set of sit to stands without rests.  PT wtih most instability while completing squat/extension needing a chair behind him for safety.  No LOB with ambulation, however did require cues for UE swing and larger steps with gait.    Personal Factors and Comorbidities Age;Fitness;Comorbidity 2    Comorbidities radiation history, unknown cancer status    Examination-Activity Limitations Other    Examination-Participation Restrictions Other    Rehab Potential Good    PT Frequency 2x / week    PT Duration 4 weeks    PT Next Visit Plan contiue to progress LE strength and stabiliy while decreasing lumbar pain.   progress to dynamic balance.    PT Home Exercise Plan 11/06/19: sit to stand, ab brace           Patient will benefit from skilled therapeutic intervention in order to improve the following deficits and impairments:     Visit Diagnosis: Chronic bilateral low back pain, unspecified whether sciatica present  Other abnormalities of gait and mobility  Muscle weakness (generalized)     Problem List Patient Active Problem List   Diagnosis Date Noted  . Malignant neoplasm of right upper lobe of lung ([place]) 05/27/2019  . Primary cancer of left upper lobe of lung ([place]) 05/27/2019  . Pseudomonas aeruginosa infection  05/21/2019  . Tracheobronchitis 05/21/2019  . Hx of radiation therapy 05/21/2019  . 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 Adenocarcinoma, lung, left ([place]) 05/21/2019  . Lung nodule 04/23/2019  . Ground glass opacity present on imaging of lung 04/23/2019  . Head and neck cancer ([place]) 04/23/2019  . Tracheostomy tube present ([place]) 04/23/2019  . PEG (percutaneous endoscopic gastrostomy) adjustment/replacement/removal ([place]) 03/08/2019  . 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 Pleural effusion   . 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[place])   . Bacteremia   . Leukocytosis   . Normocytic anemia   . Protein-calorie malnutrition, severe 10/08/2018  . PSVT (paroxysmal supraventricular tachycardia) ([place]) 10/03/2018  . Atrial fibrillation ([place]) 10/03/2018  . Goals of care, counseling/discussion   . Dysphagia   . Loss of weight   . 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 Gastritis determined by endoscopy   . Esophageal dysphagia   . 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 Globus sensation 12/12/2016  . Smokes with greater than 40 pack year history 10/20/2016  . Hemorrhoids, internal, with bleeding 10/06/2015  . History of colonic polyps 04/14/2015  . Vitamin D deficiency 10/09/2014  . HLD (hyperlipidemia) 10/08/2012  . GERD (gastroesophageal reflux disease) 10/08/2012  . HTN (hypertension)   . Diverticulitis 10/25/2009   Teena Irani, PTA/CLT [phone]  Teena Irani 11/24/2019, 2:43 PM  [facility] [address] [place], [name], [address] Phone: [phone]   Fax:  [phone]  Name: Logan Price MRN: [id number] Date of Birth: [birth year]

## 2019-11-26 ENCOUNTER — Encounter (HOSPITAL_COMMUNITY): Payer: PRIVATE HEALTH INSURANCE

## 2019-11-28 ENCOUNTER — Ambulatory Visit (HOSPITAL_COMMUNITY): Payer: Medicare Other | Attending: Family | Admitting: Physical Therapy

## 2019-11-28 ENCOUNTER — Other Ambulatory Visit: Payer: Self-pay

## 2019-11-28 ENCOUNTER — Other Ambulatory Visit: Payer: Self-pay | Admitting: Family

## 2019-11-28 ENCOUNTER — Ambulatory Visit (HOSPITAL_COMMUNITY): Payer: Medicare Other

## 2019-11-28 ENCOUNTER — Encounter (HOSPITAL_COMMUNITY): Payer: Self-pay

## 2019-11-28 DIAGNOSIS — M545 Low back pain: Secondary | ICD-10-CM | POA: Insufficient documentation

## 2019-11-28 DIAGNOSIS — G8929 Other chronic pain: Secondary | ICD-10-CM

## 2019-11-28 DIAGNOSIS — R2689 Other abnormalities of gait and mobility: Secondary | ICD-10-CM | POA: Insufficient documentation

## 2019-11-28 DIAGNOSIS — I890 Lymphedema, not elsewhere classified: Secondary | ICD-10-CM | POA: Diagnosis not present

## 2019-11-28 DIAGNOSIS — M5442 Lumbago with sciatica, left side: Secondary | ICD-10-CM

## 2019-11-28 NOTE — Therapy (Signed)
[place], [name], [address] Phone: [phone]   Fax:  [phone]  Physical Therapy Treatment  Patient Details  Name: Logan Price MRN: [id number] Date of Birth: 1946/01/19 Referring Provider (PT): Dr. Isidore Moos   Encounter Date: 11/28/2019   PT End of Session - 11/28/19 1530    Visit Number 3    Number of Visits 4    Date for PT Re-Evaluation 12/10/19    Authorization Type Medicare/ Generic commercial secondary    Progress Note Due on Visit 4    PT Start Time 6761    PT Stop Time 1525    PT Time Calculation (min) 40 min    Activity Tolerance Patient tolerated treatment well    Behavior During Therapy [facility] for tasks assessed/performed           Past Medical History:  Diagnosis Date  . Diverticulitis   . Dysrhythmia 09/2018   episode of SVT while in hospital  . 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 GERD (gastroesophageal reflux disease)   . 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 HTN (hypertension)   . Hyperlipidemia     Past Surgical History:  Procedure Laterality Date  . BIOPSY  05/04/2015   Procedure: BIOPSY;  Surgeon: Danie Binder, MD;  [facility];  Service: Endoscopy;;  bx's of ileocecal valve   . BRONCHIAL BRUSHINGS  04/29/2019   Procedure: BRONCHIAL BRUSHINGS;  Surgeon: Garner Nash, DO;  Location: [place];  Service: Pulmonary;;  . BRONCHIAL NEEDLE ASPIRATION BIOPSY  04/29/2019   Procedure: BRONCHIAL NEEDLE ASPIRATION BIOPSIES;  Surgeon: Garner Nash, DO;  Location: [place];  Service: Pulmonary;;  . BRONCHIAL WASHINGS  04/29/2019   Procedure: BRONCHIAL WASHINGS;  Surgeon: Garner Nash, DO;  [facility];  Service: Pulmonary;;  . COLONOSCOPY WITH PROPOFOL N/A 05/04/2015   Dr. Oneida Alar: normal appearing ileum with prominent IC valve with tubular adenomas, moderate diverticulosis in sigmoid colon, ascending colon, and retum. Moderate sized internal  hemorrhoids. Surveillance in 5 years  . ELECTROMAGNETIC NAVIGATION BROCHOSCOPY  04/29/2019   Procedure: NAVIGATION BRONCHOSCOPY;  Surgeon: Garner Nash, DO;  Location: [facility];  Service: Pulmonary;;  . ESOPHAGOGASTRODUODENOSCOPY (EGD) WITH PROPOFOL N/A 12/19/2016   Procedure: ESOPHAGOGASTRODUODENOSCOPY (EGD) WITH PROPOFOL;  Surgeon: Danie Binder, MD;  [facility];  Service: Endoscopy;  Laterality: N/A;  11:30am  . FLEXIBLE SIGMOIDOSCOPY N/A 12/10/2015   hemorrhoid banding X 3   . HEMORRHOID BANDING N/A 12/10/2015   Procedure: HEMORRHOID BANDING;  Surgeon: Danie Binder, MD;  [facility];  Service: Endoscopy;  Laterality: N/A;  1:30 PM  . INSERTION OF SUPRAPUBIC CATHETER N/A 02/24/2019   Procedure: INSERTION OF SUPRAPUBIC CATHETER;  Surgeon: Kathie Rhodes, MD;  [facility];  Service: Urology;  Laterality: N/A;  . IR CM INJ ANY COLONIC TUBE W/FLUORO  10/30/2018  . IR GASTROSTOMY TUBE MOD SED  10/04/2018  . IR GASTROSTOMY TUBE REMOVAL  10/08/2019  . IR IMAGING GUIDED PORT INSERTION  10/04/2018  . IR REMOVAL TUN ACCESS W/ PORT W/O FL MOD SED  10/14/2018  . IR REPLACE G-TUBE SIMPLE WO FLUORO  11/03/2018  . IR REPLACE G-TUBE SIMPLE WO FLUORO  07/28/2019  . LAPAROSCOPIC INSERTION GASTROSTOMY TUBE Left 10/07/2018   Procedure: LAPAROSCOPIC  GASTROSTOMY TUBE;  Surgeon: Kinsinger, Arta Bruce, MD;  Location: [place];  Service: General;  Laterality: Left;  Marland Kitchen MICROLARYNGOSCOPY N/A 09/27/2018  [place], [name], [address] Phone: [phone]   Fax:  [phone]  Physical Therapy Treatment  Patient Details  Name: Logan Price MRN: [id number] Date of Birth: 1946/01/19 Referring Provider (PT): Dr. Isidore Moos   Encounter Date: 11/28/2019   PT End of Session - 11/28/19 1530    Visit Number 3    Number of Visits 4    Date for PT Re-Evaluation 12/10/19    Authorization Type Medicare/ Generic commercial secondary    Progress Note Due on Visit 4    PT Start Time 6761    PT Stop Time 1525    PT Time Calculation (min) 40 min    Activity Tolerance Patient tolerated treatment well    Behavior During Therapy [facility] for tasks assessed/performed           Past Medical History:  Diagnosis Date  . Diverticulitis   . Dysrhythmia 09/2018   episode of SVT while in hospital  . 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 GERD (gastroesophageal reflux disease)   . 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 HTN (hypertension)   . Hyperlipidemia     Past Surgical History:  Procedure Laterality Date  . BIOPSY  05/04/2015   Procedure: BIOPSY;  Surgeon: Danie Binder, MD;  [facility];  Service: Endoscopy;;  bx's of ileocecal valve   . BRONCHIAL BRUSHINGS  04/29/2019   Procedure: BRONCHIAL BRUSHINGS;  Surgeon: Garner Nash, DO;  Location: [facility];  Service: Pulmonary;;  . BRONCHIAL NEEDLE ASPIRATION BIOPSY  04/29/2019   Procedure: BRONCHIAL NEEDLE ASPIRATION BIOPSIES;  Surgeon: Garner Nash, DO;  Location: [facility];  Service: Pulmonary;;  . BRONCHIAL WASHINGS  04/29/2019   Procedure: BRONCHIAL WASHINGS;  Surgeon: Garner Nash, DO;  [facility];  Service: Pulmonary;;  . COLONOSCOPY WITH PROPOFOL N/A 05/04/2015   Dr. Oneida Alar: normal appearing ileum with prominent IC valve with tubular adenomas, moderate diverticulosis in sigmoid colon, ascending colon, and retum. Moderate sized internal  hemorrhoids. Surveillance in 5 years  . ELECTROMAGNETIC NAVIGATION BROCHOSCOPY  04/29/2019   Procedure: NAVIGATION BRONCHOSCOPY;  Surgeon: Garner Nash, DO;  Location: [facility];  Service: Pulmonary;;  . ESOPHAGOGASTRODUODENOSCOPY (EGD) WITH PROPOFOL N/A 12/19/2016   Procedure: ESOPHAGOGASTRODUODENOSCOPY (EGD) WITH PROPOFOL;  Surgeon: Danie Binder, MD;  [facility];  Service: Endoscopy;  Laterality: N/A;  11:30am  . FLEXIBLE SIGMOIDOSCOPY N/A 12/10/2015   hemorrhoid banding X 3   . HEMORRHOID BANDING N/A 12/10/2015   Procedure: HEMORRHOID BANDING;  Surgeon: Danie Binder, MD;  [facility];  Service: Endoscopy;  Laterality: N/A;  1:30 PM  . INSERTION OF SUPRAPUBIC CATHETER N/A 02/24/2019   Procedure: INSERTION OF SUPRAPUBIC CATHETER;  Surgeon: Kathie Rhodes, MD;  [facility];  Service: Urology;  Laterality: N/A;  . IR CM INJ ANY COLONIC TUBE W/FLUORO  10/30/2018  . IR GASTROSTOMY TUBE MOD SED  10/04/2018  . IR GASTROSTOMY TUBE REMOVAL  10/08/2019  . IR IMAGING GUIDED PORT INSERTION  10/04/2018  . IR REMOVAL TUN ACCESS W/ PORT W/O FL MOD SED  10/14/2018  . IR REPLACE G-TUBE SIMPLE WO FLUORO  11/03/2018  . IR REPLACE G-TUBE SIMPLE WO FLUORO  07/28/2019  . LAPAROSCOPIC INSERTION GASTROSTOMY TUBE Left 10/07/2018   Procedure: LAPAROSCOPIC  GASTROSTOMY TUBE;  Surgeon: Kinsinger, Arta Bruce, MD;  Location: [facility];  Service: General;  Laterality: Left;  Marland Kitchen MICROLARYNGOSCOPY N/A 09/27/2018  [place], [name], [address] Phone: [phone]   Fax:  [phone]  Physical Therapy Treatment  Patient Details  Name: Logan Price MRN: [id number] Date of Birth: 1946/01/19 Referring Provider (PT): Dr. Isidore Moos   Encounter Date: 11/28/2019   PT End of Session - 11/28/19 1530    Visit Number 3    Number of Visits 4    Date for PT Re-Evaluation 12/10/19    Authorization Type Medicare/ Generic commercial secondary    Progress Note Due on Visit 4    PT Start Time 6761    PT Stop Time 1525    PT Time Calculation (min) 40 min    Activity Tolerance Patient tolerated treatment well    Behavior During Therapy [facility] for tasks assessed/performed           Past Medical History:  Diagnosis Date  . Diverticulitis   . Dysrhythmia 09/2018   episode of SVT while in hospital  . 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 GERD (gastroesophageal reflux disease)   . 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 HTN (hypertension)   . Hyperlipidemia     Past Surgical History:  Procedure Laterality Date  . BIOPSY  05/04/2015   Procedure: BIOPSY;  Surgeon: Danie Binder, MD;  [facility];  Service: Endoscopy;;  bx's of ileocecal valve   . BRONCHIAL BRUSHINGS  04/29/2019   Procedure: BRONCHIAL BRUSHINGS;  Surgeon: Garner Nash, DO;  Location: [facility];  Service: Pulmonary;;  . BRONCHIAL NEEDLE ASPIRATION BIOPSY  04/29/2019   Procedure: BRONCHIAL NEEDLE ASPIRATION BIOPSIES;  Surgeon: Garner Nash, DO;  Location: [place];  Service: Pulmonary;;  . BRONCHIAL WASHINGS  04/29/2019   Procedure: BRONCHIAL WASHINGS;  Surgeon: Garner Nash, DO;  [facility];  Service: Pulmonary;;  . COLONOSCOPY WITH PROPOFOL N/A 05/04/2015   Dr. Oneida Alar: normal appearing ileum with prominent IC valve with tubular adenomas, moderate diverticulosis in sigmoid colon, ascending colon, and retum. Moderate sized internal  hemorrhoids. Surveillance in 5 years  . ELECTROMAGNETIC NAVIGATION BROCHOSCOPY  04/29/2019   Procedure: NAVIGATION BRONCHOSCOPY;  Surgeon: Garner Nash, DO;  Location: [facility];  Service: Pulmonary;;  . ESOPHAGOGASTRODUODENOSCOPY (EGD) WITH PROPOFOL N/A 12/19/2016   Procedure: ESOPHAGOGASTRODUODENOSCOPY (EGD) WITH PROPOFOL;  Surgeon: Danie Binder, MD;  [facility];  Service: Endoscopy;  Laterality: N/A;  11:30am  . FLEXIBLE SIGMOIDOSCOPY N/A 12/10/2015   hemorrhoid banding X 3   . HEMORRHOID BANDING N/A 12/10/2015   Procedure: HEMORRHOID BANDING;  Surgeon: Danie Binder, MD;  [facility];  Service: Endoscopy;  Laterality: N/A;  1:30 PM  . INSERTION OF SUPRAPUBIC CATHETER N/A 02/24/2019   Procedure: INSERTION OF SUPRAPUBIC CATHETER;  Surgeon: Kathie Rhodes, MD;  [facility];  Service: Urology;  Laterality: N/A;  . IR CM INJ ANY COLONIC TUBE W/FLUORO  10/30/2018  . IR GASTROSTOMY TUBE MOD SED  10/04/2018  . IR GASTROSTOMY TUBE REMOVAL  10/08/2019  . IR IMAGING GUIDED PORT INSERTION  10/04/2018  . IR REMOVAL TUN ACCESS W/ PORT W/O FL MOD SED  10/14/2018  . IR REPLACE G-TUBE SIMPLE WO FLUORO  11/03/2018  . IR REPLACE G-TUBE SIMPLE WO FLUORO  07/28/2019  . LAPAROSCOPIC INSERTION GASTROSTOMY TUBE Left 10/07/2018   Procedure: LAPAROSCOPIC  GASTROSTOMY TUBE;  Surgeon: Kinsinger, Arta Bruce, MD;  Location: [facility];  Service: General;  Laterality: Left;  Marland Kitchen MICROLARYNGOSCOPY N/A 09/27/2018  PEG (percutaneous endoscopic gastrostomy) adjustment/replacement/removal ([place]) 03/08/2019  . 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 Pleural effusion   . 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[place])   . Bacteremia   . Leukocytosis   . Normocytic anemia   . Protein-calorie malnutrition, severe 10/08/2018  . PSVT (paroxysmal supraventricular tachycardia) ([place]) 10/03/2018  . Atrial fibrillation ([place]) 10/03/2018  . Goals of care, counseling/discussion   . Dysphagia   . Loss of weight   . 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 Gastritis determined by endoscopy   . Esophageal dysphagia   . 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 Globus sensation 12/12/2016  . Smokes with greater than 40 pack year history 10/20/2016  . Hemorrhoids, internal, with bleeding 10/06/2015  . History of colonic polyps 04/14/2015  . Vitamin D deficiency 10/09/2014  . HLD (hyperlipidemia) 10/08/2012  . GERD (gastroesophageal reflux disease) 10/08/2012  . HTN (hypertension)   . Diverticulitis 10/25/2009    Rayetta Humphrey, PT CLT [phone] 11/28/2019, 3:34 PM  [facility] [address] [place], [name], [address] Phone: [phone]   Fax:  [phone]  Name: Logan Price MRN: [id number] Date of Birth: 02-Oct-1945

## 2019-11-28 NOTE — Therapy (Signed)
sciatica present  Other abnormalities of gait and mobility     Problem List Patient Active Problem List   Diagnosis Date Noted  . Malignant neoplasm of right upper lobe of lung ([place]) 05/27/2019  . Primary cancer of left upper lobe of lung ([place]) 05/27/2019  . Pseudomonas aeruginosa infection 05/21/2019  . Tracheobronchitis 05/21/2019  . Hx of radiation therapy 05/21/2019  . 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 Squamous cell lung cancer, right ([place]) 05/21/2019  . Adenocarcinoma, lung, left ([place]) 05/21/2019  . Lung nodule 04/23/2019  . Ground glass opacity present on imaging of lung 04/23/2019  . Head and neck cancer ([place]) 04/23/2019  . Tracheostomy tube present ([place]) 04/23/2019  . PEG (percutaneous endoscopic gastrostomy) adjustment/replacement/removal ([place]) 03/08/2019  . 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 Pleural effusion   . 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[place])   . Bacteremia   . Leukocytosis   . Normocytic anemia   . Protein-calorie malnutrition, severe 10/08/2018  . PSVT (paroxysmal supraventricular tachycardia) ([place]) 10/03/2018  . Atrial fibrillation ([place]) 10/03/2018  . Goals of care, counseling/discussion   . Dysphagia   . Loss of weight   . 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 Gastritis determined by endoscopy   . Esophageal dysphagia   . 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 Globus sensation 12/12/2016  . Smokes with greater than 40 pack year history 10/20/2016  . Hemorrhoids, internal, with bleeding 10/06/2015  . History of colonic polyps 04/14/2015  . Vitamin D deficiency 10/09/2014  . HLD (hyperlipidemia) 10/08/2012  . GERD (gastroesophageal reflux disease) 10/08/2012  . HTN (hypertension)   . Diverticulitis 10/25/2009   Ihor Austin, LPTA/CLT;  CBIS [phone]  [name] 11/28/2019, 5:23 PM  [facility] [address] [place], [name], [address] Phone: [phone]   Fax:  [phone]  Name: SERGI GELLNER MRN: [id number] Date of Birth: 1945-12-13  [place], [name], [address] Phone: [phone]   Fax:  [phone]  Physical Therapy Treatment  Patient Details  Name: Logan Price MRN: [id number] Date of Birth: [birth year] Referring Provider (PT): Dr. Isidore Moos   Encounter Date: 11/28/2019   PT End of Session - 11/28/19 1538    Visit Number 6    Number of Visits 8    Date for PT Re-Evaluation 12/05/19    Authorization Type Medicare/ Generic commercial secondary    Progress Note Due on Visit 8    PT Start Time 1535    PT Stop Time 1614    PT Time Calculation (min) 39 min    Equipment Utilized During Treatment --   SPC/ CGA wihtout AD   Activity Tolerance Patient tolerated treatment well    Behavior During Therapy [facility] for tasks assessed/performed           Past Medical History:  Diagnosis Date  . Diverticulitis   . Dysrhythmia 09/2018   episode of SVT while in hospital  . 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 GERD (gastroesophageal reflux disease)   . 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 HTN (hypertension)   . Hyperlipidemia     Past Surgical History:  Procedure Laterality Date  . BIOPSY  05/04/2015   Procedure: BIOPSY;  Surgeon: Danie Binder, MD;  [facility];  Service: Endoscopy;;  bx's of ileocecal valve   . BRONCHIAL BRUSHINGS  04/29/2019   Procedure: BRONCHIAL BRUSHINGS;  Surgeon: Garner Nash, DO;  Location: [facility];  Service: Pulmonary;;  . BRONCHIAL NEEDLE ASPIRATION BIOPSY  04/29/2019   Procedure: BRONCHIAL NEEDLE ASPIRATION BIOPSIES;  Surgeon: Garner Nash, DO;  Location: [facility];  Service: Pulmonary;;  . BRONCHIAL WASHINGS  04/29/2019   Procedure: BRONCHIAL WASHINGS;  Surgeon: Garner Nash, DO;  [facility];  Service: Pulmonary;;  . COLONOSCOPY WITH PROPOFOL N/A 05/04/2015   Dr. Oneida Alar: normal appearing ileum with prominent IC valve with tubular adenomas, moderate diverticulosis in  sigmoid colon, ascending colon, and retum. Moderate sized internal hemorrhoids. Surveillance in 5 years  . ELECTROMAGNETIC NAVIGATION BROCHOSCOPY  04/29/2019   Procedure: NAVIGATION BRONCHOSCOPY;  Surgeon: Garner Nash, DO;  Location: [facility];  Service: Pulmonary;;  . ESOPHAGOGASTRODUODENOSCOPY (EGD) WITH PROPOFOL N/A 12/19/2016   Procedure: ESOPHAGOGASTRODUODENOSCOPY (EGD) WITH PROPOFOL;  Surgeon: Danie Binder, MD;  [facility];  Service: Endoscopy;  Laterality: N/A;  11:30am  . FLEXIBLE SIGMOIDOSCOPY N/A 12/10/2015   hemorrhoid banding X 3   . HEMORRHOID BANDING N/A 12/10/2015   Procedure: HEMORRHOID BANDING;  Surgeon: Danie Binder, MD;  [facility];  Service: Endoscopy;  Laterality: N/A;  1:30 PM  . INSERTION OF SUPRAPUBIC CATHETER N/A 02/24/2019   Procedure: INSERTION OF SUPRAPUBIC CATHETER;  Surgeon: Kathie Rhodes, MD;  [facility];  Service: Urology;  Laterality: N/A;  . IR CM INJ ANY COLONIC TUBE W/FLUORO  10/30/2018  . IR GASTROSTOMY TUBE MOD SED  10/04/2018  . IR GASTROSTOMY TUBE REMOVAL  10/08/2019  . IR IMAGING GUIDED PORT INSERTION  10/04/2018  . IR REMOVAL TUN ACCESS W/ PORT W/O FL MOD SED  10/14/2018  . IR REPLACE G-TUBE SIMPLE WO FLUORO  11/03/2018  . IR REPLACE G-TUBE SIMPLE WO FLUORO  07/28/2019  . LAPAROSCOPIC INSERTION GASTROSTOMY TUBE Left 10/07/2018   Procedure: LAPAROSCOPIC  GASTROSTOMY TUBE;  Surgeon: Kieth Brightly Arta Bruce, MD;  Location: [facility]  [place], [name], [address] Phone: [phone]   Fax:  [phone]  Physical Therapy Treatment  Patient Details  Name: Logan Price MRN: [id number] Date of Birth: [birth year] Referring Provider (PT): Dr. Isidore Moos   Encounter Date: 11/28/2019   PT End of Session - 11/28/19 1538    Visit Number 6    Number of Visits 8    Date for PT Re-Evaluation 12/05/19    Authorization Type Medicare/ Generic commercial secondary    Progress Note Due on Visit 8    PT Start Time 1535    PT Stop Time 1614    PT Time Calculation (min) 39 min    Equipment Utilized During Treatment --   SPC/ CGA wihtout AD   Activity Tolerance Patient tolerated treatment well    Behavior During Therapy [facility] for tasks assessed/performed           Past Medical History:  Diagnosis Date  . Diverticulitis   . Dysrhythmia 09/2018   episode of SVT while in hospital  . 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 GERD (gastroesophageal reflux disease)   . 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 HTN (hypertension)   . Hyperlipidemia     Past Surgical History:  Procedure Laterality Date  . BIOPSY  05/04/2015   Procedure: BIOPSY;  Surgeon: Danie Binder, MD;  [facility];  Service: Endoscopy;;  bx's of ileocecal valve   . BRONCHIAL BRUSHINGS  04/29/2019   Procedure: BRONCHIAL BRUSHINGS;  Surgeon: Garner Nash, DO;  Location: [place];  Service: Pulmonary;;  . BRONCHIAL NEEDLE ASPIRATION BIOPSY  04/29/2019   Procedure: BRONCHIAL NEEDLE ASPIRATION BIOPSIES;  Surgeon: Garner Nash, DO;  Location: [facility];  Service: Pulmonary;;  . BRONCHIAL WASHINGS  04/29/2019   Procedure: BRONCHIAL WASHINGS;  Surgeon: Garner Nash, DO;  [facility];  Service: Pulmonary;;  . COLONOSCOPY WITH PROPOFOL N/A 05/04/2015   Dr. Oneida Alar: normal appearing ileum with prominent IC valve with tubular adenomas, moderate diverticulosis in  sigmoid colon, ascending colon, and retum. Moderate sized internal hemorrhoids. Surveillance in 5 years  . ELECTROMAGNETIC NAVIGATION BROCHOSCOPY  04/29/2019   Procedure: NAVIGATION BRONCHOSCOPY;  Surgeon: Garner Nash, DO;  Location: [facility];  Service: Pulmonary;;  . ESOPHAGOGASTRODUODENOSCOPY (EGD) WITH PROPOFOL N/A 12/19/2016   Procedure: ESOPHAGOGASTRODUODENOSCOPY (EGD) WITH PROPOFOL;  Surgeon: Danie Binder, MD;  [facility];  Service: Endoscopy;  Laterality: N/A;  11:30am  . FLEXIBLE SIGMOIDOSCOPY N/A 12/10/2015   hemorrhoid banding X 3   . HEMORRHOID BANDING N/A 12/10/2015   Procedure: HEMORRHOID BANDING;  Surgeon: Danie Binder, MD;  [facility];  Service: Endoscopy;  Laterality: N/A;  1:30 PM  . INSERTION OF SUPRAPUBIC CATHETER N/A 02/24/2019   Procedure: INSERTION OF SUPRAPUBIC CATHETER;  Surgeon: Kathie Rhodes, MD;  [facility];  Service: Urology;  Laterality: N/A;  . IR CM INJ ANY COLONIC TUBE W/FLUORO  10/30/2018  . IR GASTROSTOMY TUBE MOD SED  10/04/2018  . IR GASTROSTOMY TUBE REMOVAL  10/08/2019  . IR IMAGING GUIDED PORT INSERTION  10/04/2018  . IR REMOVAL TUN ACCESS W/ PORT W/O FL MOD SED  10/14/2018  . IR REPLACE G-TUBE SIMPLE WO FLUORO  11/03/2018  . IR REPLACE G-TUBE SIMPLE WO FLUORO  07/28/2019  . LAPAROSCOPIC INSERTION GASTROSTOMY TUBE Left 10/07/2018   Procedure: LAPAROSCOPIC  GASTROSTOMY TUBE;  Surgeon: Kieth Brightly Arta Bruce, MD;  Location: [facility]  sciatica present  Other abnormalities of gait and mobility     Problem List Patient Active Problem List   Diagnosis Date Noted  . Malignant neoplasm of right upper lobe of lung ([place]) 05/27/2019  . Primary cancer of left upper lobe of lung ([place]) 05/27/2019  . Pseudomonas aeruginosa infection 05/21/2019  . Tracheobronchitis 05/21/2019  . Hx of radiation therapy 05/21/2019  . 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 Squamous cell lung cancer, right ([place]) 05/21/2019  . Adenocarcinoma, lung, left ([place]) 05/21/2019  . Lung nodule 04/23/2019  . Ground glass opacity present on imaging of lung 04/23/2019  . Head and neck cancer ([place]) 04/23/2019  . Tracheostomy tube present ([place]) 04/23/2019  . PEG (percutaneous endoscopic gastrostomy) adjustment/replacement/removal ([place]) 03/08/2019  . 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 Pleural effusion   . 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[place])   . Bacteremia   . Leukocytosis   . Normocytic anemia   . Protein-calorie malnutrition, severe 10/08/2018  . PSVT (paroxysmal supraventricular tachycardia) ([place]) 10/03/2018  . Atrial fibrillation ([place]) 10/03/2018  . Goals of care, counseling/discussion   . Dysphagia   . Loss of weight   . 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 Gastritis determined by endoscopy   . Esophageal dysphagia   . 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 Globus sensation 12/12/2016  . Smokes with greater than 40 pack year history 10/20/2016  . Hemorrhoids, internal, with bleeding 10/06/2015  . History of colonic polyps 04/14/2015  . Vitamin D deficiency 10/09/2014  . HLD (hyperlipidemia) 10/08/2012  . GERD (gastroesophageal reflux disease) 10/08/2012  . HTN (hypertension)   . Diverticulitis 10/25/2009   Ihor Austin, LPTA/CLT;  CBIS [phone]  [name] 11/28/2019, 5:23 PM  [facility] [address] [place], [name], [address] Phone: [phone]   Fax:  [phone]  Name: SERGI GELLNER MRN: [id number] Date of Birth: 1945-12-13

## 2019-12-02 ENCOUNTER — Encounter (HOSPITAL_COMMUNITY): Payer: Self-pay | Admitting: Physical Therapy

## 2019-12-02 ENCOUNTER — Ambulatory Visit (HOSPITAL_COMMUNITY): Payer: Medicare Other | Admitting: Physical Therapy

## 2019-12-02 ENCOUNTER — Other Ambulatory Visit: Payer: Self-pay

## 2019-12-02 DIAGNOSIS — M545 Low back pain: Secondary | ICD-10-CM | POA: Diagnosis not present

## 2019-12-02 DIAGNOSIS — R2689 Other abnormalities of gait and mobility: Secondary | ICD-10-CM | POA: Diagnosis not present

## 2019-12-02 DIAGNOSIS — I890 Lymphedema, not elsewhere classified: Secondary | ICD-10-CM | POA: Diagnosis not present

## 2019-12-02 DIAGNOSIS — G8929 Other chronic pain: Secondary | ICD-10-CM

## 2019-12-02 NOTE — Therapy (Signed)
cancer ([place]) 04/23/2019  . Tracheostomy tube present ([place]) 04/23/2019  . PEG (percutaneous endoscopic gastrostomy) adjustment/replacement/removal ([place]) 03/08/2019  . 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 Pleural effusion   . 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[place])   . Bacteremia   . Leukocytosis   . Normocytic anemia   . Protein-calorie malnutrition, severe 10/08/2018  . PSVT (paroxysmal supraventricular tachycardia) ([place]) 10/03/2018  . Atrial fibrillation ([place]) 10/03/2018  . Goals of care, counseling/discussion   . Dysphagia   . Loss of weight   . 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 Gastritis determined by endoscopy   . Esophageal dysphagia   . 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 Globus sensation 12/12/2016  . Smokes with greater than 40 pack year history 10/20/2016  . Hemorrhoids, internal, with bleeding 10/06/2015  . History of colonic polyps 04/14/2015  . Vitamin D deficiency 10/09/2014  . HLD (hyperlipidemia) 10/08/2012  . GERD (gastroesophageal reflux disease) 10/08/2012  . HTN (hypertension)   . Diverticulitis 10/25/2009   3:40 PM, 12/02/19 Josue Hector PT DPT  Physical Therapist with [facility]  [phone] [place], [name], [address] Phone: [phone]   Fax:  [phone]  Name: Logan Price MRN: [id number] Date of Birth: 04/21/1945  cancer ([place]) 04/23/2019  . Tracheostomy tube present ([place]) 04/23/2019  . PEG (percutaneous endoscopic gastrostomy) adjustment/replacement/removal ([place]) 03/08/2019  . 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 Pleural effusion   . 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[place])   . Bacteremia   . Leukocytosis   . Normocytic anemia   . Protein-calorie malnutrition, severe 10/08/2018  . PSVT (paroxysmal supraventricular tachycardia) ([place]) 10/03/2018  . Atrial fibrillation ([place]) 10/03/2018  . Goals of care, counseling/discussion   . Dysphagia   . Loss of weight   . 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 Gastritis determined by endoscopy   . Esophageal dysphagia   . 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 Globus sensation 12/12/2016  . Smokes with greater than 40 pack year history 10/20/2016  . Hemorrhoids, internal, with bleeding 10/06/2015  . History of colonic polyps 04/14/2015  . Vitamin D deficiency 10/09/2014  . HLD (hyperlipidemia) 10/08/2012  . GERD (gastroesophageal reflux disease) 10/08/2012  . HTN (hypertension)   . Diverticulitis 10/25/2009   3:40 PM, 12/02/19 Josue Hector PT DPT  Physical Therapist with [facility]  [phone] [place], [name], [address] Phone: [phone]   Fax:  [phone]  Name: Logan Price MRN: [id number] Date of Birth: 04/21/1945  [place], [name], [address] Phone: [phone]   Fax:  [phone]  Physical Therapy Treatment  Patient Details  Name: Logan Price MRN: [id number] Date of Birth: 1945/10/06 Referring Provider (PT): Dr. Isidore Moos   Encounter Date: 12/02/2019   PT End of Session - 12/02/19 1121    Visit Number 7    Number of Visits 8    Date for PT Re-Evaluation 12/05/19    Authorization Type Medicare/ Generic commercial secondary    Progress Note Due on Visit 8    PT Start Time 1117    PT Stop Time 1155    PT Time Calculation (min) 38 min    Equipment Utilized During Treatment --   SPC/ CGA wihtout AD   Activity Tolerance Patient tolerated treatment well;Patient limited by fatigue    Behavior During Therapy [facility] for tasks assessed/performed           Past Medical History:  Diagnosis Date  . Diverticulitis   . Dysrhythmia 09/2018   episode of SVT while in hospital  . 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 GERD (gastroesophageal reflux disease)   . 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 HTN (hypertension)   . Hyperlipidemia     Past Surgical History:  Procedure Laterality Date  . BIOPSY  05/04/2015   Procedure: BIOPSY;  Surgeon: Danie Binder, MD;  [facility];  Service: Endoscopy;;  bx's of ileocecal valve   . BRONCHIAL BRUSHINGS  04/29/2019   Procedure: BRONCHIAL BRUSHINGS;  Surgeon: Garner Nash, DO;  Location: [place];  Service: Pulmonary;;  . BRONCHIAL NEEDLE ASPIRATION BIOPSY  04/29/2019   Procedure: BRONCHIAL NEEDLE ASPIRATION BIOPSIES;  Surgeon: Garner Nash, DO;  Location: [place];  Service: Pulmonary;;  . BRONCHIAL WASHINGS  04/29/2019   Procedure: BRONCHIAL WASHINGS;  Surgeon: Garner Nash, DO;  [facility];  Service: Pulmonary;;  . COLONOSCOPY WITH PROPOFOL N/A 05/04/2015   Dr. Oneida Alar: normal appearing ileum with prominent IC valve with tubular adenomas,  moderate diverticulosis in sigmoid colon, ascending colon, and retum. Moderate sized internal hemorrhoids. Surveillance in 5 years  . ELECTROMAGNETIC NAVIGATION BROCHOSCOPY  04/29/2019   Procedure: NAVIGATION BRONCHOSCOPY;  Surgeon: Garner Nash, DO;  Location: [facility];  Service: Pulmonary;;  . ESOPHAGOGASTRODUODENOSCOPY (EGD) WITH PROPOFOL N/A 12/19/2016   Procedure: ESOPHAGOGASTRODUODENOSCOPY (EGD) WITH PROPOFOL;  Surgeon: Danie Binder, MD;  [facility];  Service: Endoscopy;  Laterality: N/A;  11:30am  . FLEXIBLE SIGMOIDOSCOPY N/A 12/10/2015   hemorrhoid banding X 3   . HEMORRHOID BANDING N/A 12/10/2015   Procedure: HEMORRHOID BANDING;  Surgeon: Danie Binder, MD;  [facility];  Service: Endoscopy;  Laterality: N/A;  1:30 PM  . INSERTION OF SUPRAPUBIC CATHETER N/A 02/24/2019   Procedure: INSERTION OF SUPRAPUBIC CATHETER;  Surgeon: Kathie Rhodes, MD;  [facility];  Service: Urology;  Laterality: N/A;  . IR CM INJ ANY COLONIC TUBE W/FLUORO  10/30/2018  . IR GASTROSTOMY TUBE MOD SED  10/04/2018  . IR GASTROSTOMY TUBE REMOVAL  10/08/2019  . IR IMAGING GUIDED PORT INSERTION  10/04/2018  . IR REMOVAL TUN ACCESS W/ PORT W/O FL MOD SED  10/14/2018  . IR REPLACE G-TUBE SIMPLE WO FLUORO  11/03/2018  . IR REPLACE G-TUBE SIMPLE WO FLUORO  07/28/2019  . LAPAROSCOPIC INSERTION GASTROSTOMY TUBE Left 10/07/2018   Procedure: LAPAROSCOPIC  GASTROSTOMY TUBE;  Surgeon: Kieth Brightly Arta Bruce, MD;  cancer ([place]) 04/23/2019  . Tracheostomy tube present ([place]) 04/23/2019  . PEG (percutaneous endoscopic gastrostomy) adjustment/replacement/removal ([place]) 03/08/2019  . 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 Pleural effusion   . 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[place])   . Bacteremia   . Leukocytosis   . Normocytic anemia   . Protein-calorie malnutrition, severe 10/08/2018  . PSVT (paroxysmal supraventricular tachycardia) ([place]) 10/03/2018  . Atrial fibrillation ([place]) 10/03/2018  . Goals of care, counseling/discussion   . Dysphagia   . Loss of weight   . 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 Gastritis determined by endoscopy   . Esophageal dysphagia   . 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 Globus sensation 12/12/2016  . Smokes with greater than 40 pack year history 10/20/2016  . Hemorrhoids, internal, with bleeding 10/06/2015  . History of colonic polyps 04/14/2015  . Vitamin D deficiency 10/09/2014  . HLD (hyperlipidemia) 10/08/2012  . GERD (gastroesophageal reflux disease) 10/08/2012  . HTN (hypertension)   . Diverticulitis 10/25/2009   3:40 PM, 12/02/19 Josue Hector PT DPT  Physical Therapist with [facility]  [phone] [place], [name], [address] Phone: [phone]   Fax:  [phone]  Name: Logan Price MRN: [id number] Date of Birth: 04/21/1945

## 2019-12-04 ENCOUNTER — Encounter (HOSPITAL_COMMUNITY): Payer: Self-pay | Admitting: Physical Therapy

## 2019-12-04 ENCOUNTER — Ambulatory Visit (HOSPITAL_COMMUNITY): Payer: Medicare Other | Admitting: Physical Therapy

## 2019-12-04 ENCOUNTER — Other Ambulatory Visit: Payer: Self-pay

## 2019-12-04 DIAGNOSIS — R2689 Other abnormalities of gait and mobility: Secondary | ICD-10-CM

## 2019-12-04 DIAGNOSIS — G8929 Other chronic pain: Secondary | ICD-10-CM | POA: Diagnosis not present

## 2019-12-04 DIAGNOSIS — M545 Low back pain: Secondary | ICD-10-CM | POA: Diagnosis not present

## 2019-12-04 DIAGNOSIS — I890 Lymphedema, not elsewhere classified: Secondary | ICD-10-CM

## 2019-12-04 NOTE — Therapy (Signed)
[place], [name], [address] Phone: [phone]   Fax:  [phone]  Physical Therapy Treatment  Patient Details  Name: Logan Price MRN: [id number] Date of Birth: September 29, 1945 Referring Provider (PT): Dr. Isidore Moos   PHYSICAL THERAPY DISCHARGE SUMMARY  Visits from Start of Care: 4  Current functional level related to goals / functional outcomes: See below   Remaining deficits: See below    Education / Equipment: HEP Plan: Patient agrees to discharge.  Patient goals were partially met. Patient is being discharged due to being pleased with the current functional level.  ?????      Encounter Date: 12/04/2019   PT End of Session - 12/04/19 1524    Visit Number 4    Number of Visits 4    Date for PT Re-Evaluation 12/10/19    Authorization Type Medicare/ Generic commercial secondary    Progress Note Due on Visit 4    PT Start Time 1425    PT Stop Time 1507    PT Time Calculation (min) 42 min    Activity Tolerance Patient tolerated treatment well    Behavior During Therapy [facility] for tasks assessed/performed           Past Medical History:  Diagnosis Date  . Diverticulitis   . Dysrhythmia 09/2018   episode of SVT while in hospital  . 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 GERD (gastroesophageal reflux disease)   . 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 HTN (hypertension)   . Hyperlipidemia     Past Surgical History:  Procedure Laterality Date  . BIOPSY  05/04/2015   Procedure: BIOPSY;  Surgeon: Danie Binder, MD;  [facility];  Service: Endoscopy;;  bx's of ileocecal valve   . BRONCHIAL BRUSHINGS  04/29/2019   Procedure: BRONCHIAL BRUSHINGS;  Surgeon: Garner Nash, DO;  Location: [place];  Service: Pulmonary;;  . BRONCHIAL NEEDLE ASPIRATION BIOPSY  04/29/2019   Procedure: BRONCHIAL NEEDLE ASPIRATION BIOPSIES;  Surgeon: Garner Nash, DO;  Location: [place];   Service: Pulmonary;;  . BRONCHIAL WASHINGS  04/29/2019   Procedure: BRONCHIAL WASHINGS;  Surgeon: Garner Nash, DO;  [facility];  Service: Pulmonary;;  . COLONOSCOPY WITH PROPOFOL N/A 05/04/2015   Dr. Oneida Alar: normal appearing ileum with prominent IC valve with tubular adenomas, moderate diverticulosis in sigmoid colon, ascending colon, and retum. Moderate sized internal hemorrhoids. Surveillance in 5 years  . ELECTROMAGNETIC NAVIGATION BROCHOSCOPY  04/29/2019   Procedure: NAVIGATION BRONCHOSCOPY;  Surgeon: Garner Nash, DO;  Location: [facility];  Service: Pulmonary;;  . ESOPHAGOGASTRODUODENOSCOPY (EGD) WITH PROPOFOL N/A 12/19/2016   Procedure: ESOPHAGOGASTRODUODENOSCOPY (EGD) WITH PROPOFOL;  Surgeon: Danie Binder, MD;  [facility];  Service: Endoscopy;  Laterality: N/A;  11:30am  . FLEXIBLE SIGMOIDOSCOPY N/A 12/10/2015   hemorrhoid banding X 3   . HEMORRHOID BANDING N/A 12/10/2015   Procedure: HEMORRHOID BANDING;  Surgeon: Danie Binder, MD;  [facility];  Service: Endoscopy;  Laterality: N/A;  1:30 PM  . INSERTION OF SUPRAPUBIC CATHETER N/A 02/24/2019   Procedure: INSERTION OF SUPRAPUBIC CATHETER;  Surgeon: Kathie Rhodes, MD;  [facility];  Service: Urology;  Laterality: N/A;  . IR CM INJ ANY COLONIC TUBE W/FLUORO  10/30/2018  . IR GASTROSTOMY TUBE MOD SED  10/04/2018  . IR GASTROSTOMY TUBE REMOVAL  10/08/2019  . IR IMAGING GUIDED PORT INSERTION  10/04/2018  . IR REMOVAL  Member Consulted wife           Patient will benefit from skilled therapeutic intervention in order to improve the following deficits and impairments:  Pain, Other (comment), Increased edema  Visit Diagnosis: Lymphedema, not elsewhere classified     Problem List Patient Active Problem List   Diagnosis Date Noted  . Malignant neoplasm of right upper lobe of lung ([place]) 05/27/2019  . Primary cancer of left upper lobe of lung ([place]) 05/27/2019  . Pseudomonas aeruginosa infection 05/21/2019  . Tracheobronchitis 05/21/2019  . Hx of radiation therapy 05/21/2019  . 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 Squamous cell lung cancer, right ([place]) 05/21/2019  . Adenocarcinoma, lung, left ([place]) 05/21/2019  . Lung nodule 04/23/2019  . Ground glass opacity present on imaging of lung 04/23/2019  . Head and neck cancer ([place]) 04/23/2019  . Tracheostomy tube present ([place]) 04/23/2019  . PEG (percutaneous endoscopic gastrostomy) adjustment/replacement/removal ([place]) 03/08/2019  . 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 Pleural effusion   . 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[place])   . Bacteremia   . Leukocytosis   . Normocytic anemia   . Protein-calorie malnutrition, severe 10/08/2018  . PSVT (paroxysmal supraventricular tachycardia) ([place]) 10/03/2018  . Atrial fibrillation ([place]) 10/03/2018  . Goals of care, counseling/discussion   . Dysphagia   . Loss of weight   . 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 Gastritis determined by endoscopy   . Esophageal dysphagia   . 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 Globus sensation 12/12/2016  . Smokes with greater than 40 pack year history 10/20/2016  . Hemorrhoids, internal, with bleeding 10/06/2015  . History of colonic polyps 04/14/2015  . Vitamin D deficiency 10/09/2014  . HLD (hyperlipidemia) 10/08/2012  . GERD (gastroesophageal reflux disease) 10/08/2012  . HTN (hypertension)   . Diverticulitis 10/25/2009    Rayetta Humphrey, PT CLT [phone] 12/04/2019, 3:28 PM  [facility] [address] [place], [name], [address] Phone: [phone]   Fax:  [phone]  Name: Logan Price MRN: [id number] Date of Birth: [birth year]  Member Consulted wife           Patient will benefit from skilled therapeutic intervention in order to improve the following deficits and impairments:  Pain, Other (comment), Increased edema  Visit Diagnosis: Lymphedema, not elsewhere classified     Problem List Patient Active Problem List   Diagnosis Date Noted  . Malignant neoplasm of right upper lobe of lung ([place]) 05/27/2019  . Primary cancer of left upper lobe of lung ([place]) 05/27/2019  . Pseudomonas aeruginosa infection 05/21/2019  . Tracheobronchitis 05/21/2019  . Hx of radiation therapy 05/21/2019  . 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 Squamous cell lung cancer, right ([place]) 05/21/2019  . Adenocarcinoma, lung, left ([place]) 05/21/2019  . Lung nodule 04/23/2019  . Ground glass opacity present on imaging of lung 04/23/2019  . Head and neck cancer ([place]) 04/23/2019  . Tracheostomy tube present ([place]) 04/23/2019  . PEG (percutaneous endoscopic gastrostomy) adjustment/replacement/removal ([place]) 03/08/2019  . 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 Pleural effusion   . 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[place])   . Bacteremia   . Leukocytosis   . Normocytic anemia   . Protein-calorie malnutrition, severe 10/08/2018  . PSVT (paroxysmal supraventricular tachycardia) ([place]) 10/03/2018  . Atrial fibrillation ([place]) 10/03/2018  . Goals of care, counseling/discussion   . Dysphagia   . Loss of weight   . 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 Gastritis determined by endoscopy   . Esophageal dysphagia   . 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 Globus sensation 12/12/2016  . Smokes with greater than 40 pack year history 10/20/2016  . Hemorrhoids, internal, with bleeding 10/06/2015  . History of colonic polyps 04/14/2015  . Vitamin D deficiency 10/09/2014  . HLD (hyperlipidemia) 10/08/2012  . GERD (gastroesophageal reflux disease) 10/08/2012  . HTN (hypertension)   . Diverticulitis 10/25/2009    Rayetta Humphrey, PT CLT [phone] 12/04/2019, 3:28 PM  [facility] [address] [place], [name], [address] Phone: [phone]   Fax:  [phone]  Name: Logan Price MRN: [id number] Date of Birth: [birth year]  Member Consulted wife           Patient will benefit from skilled therapeutic intervention in order to improve the following deficits and impairments:  Pain, Other (comment), Increased edema  Visit Diagnosis: Lymphedema, not elsewhere classified     Problem List Patient Active Problem List   Diagnosis Date Noted  . Malignant neoplasm of right upper lobe of lung ([place]) 05/27/2019  . Primary cancer of left upper lobe of lung ([place]) 05/27/2019  . Pseudomonas aeruginosa infection 05/21/2019  . Tracheobronchitis 05/21/2019  . Hx of radiation therapy 05/21/2019  . 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 Squamous cell lung cancer, right ([place]) 05/21/2019  . Adenocarcinoma, lung, left ([place]) 05/21/2019  . Lung nodule 04/23/2019  . Ground glass opacity present on imaging of lung 04/23/2019  . Head and neck cancer ([place]) 04/23/2019  . Tracheostomy tube present ([place]) 04/23/2019  . PEG (percutaneous endoscopic gastrostomy) adjustment/replacement/removal ([place]) 03/08/2019  . 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 Pleural effusion   . 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[place])   . Bacteremia   . Leukocytosis   . Normocytic anemia   . Protein-calorie malnutrition, severe 10/08/2018  . PSVT (paroxysmal supraventricular tachycardia) ([place]) 10/03/2018  . Atrial fibrillation ([place]) 10/03/2018  . Goals of care, counseling/discussion   . Dysphagia   . Loss of weight   . 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 Gastritis determined by endoscopy   . Esophageal dysphagia   . 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 Globus sensation 12/12/2016  . Smokes with greater than 40 pack year history 10/20/2016  . Hemorrhoids, internal, with bleeding 10/06/2015  . History of colonic polyps 04/14/2015  . Vitamin D deficiency 10/09/2014  . HLD (hyperlipidemia) 10/08/2012  . GERD (gastroesophageal reflux disease) 10/08/2012  . HTN (hypertension)   . Diverticulitis 10/25/2009    Rayetta Humphrey, PT CLT [phone] 12/04/2019, 3:28 PM  [facility] [address] [place], [name], [address] Phone: [phone]   Fax:  [phone]  Name: Logan Price MRN: [id number] Date of Birth: [birth year]

## 2019-12-04 NOTE — Therapy (Signed)
10/08/2018  . PSVT (paroxysmal supraventricular tachycardia) ([place]) 10/03/2018  . Atrial fibrillation ([place]) 10/03/2018  . Goals of care, counseling/discussion   . Dysphagia   . Loss of weight   . 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 Gastritis determined by endoscopy   . Esophageal dysphagia   . 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 Globus sensation 12/12/2016  . Smokes with greater than 40 pack year history 10/20/2016  . Hemorrhoids, internal, with bleeding 10/06/2015  . History of colonic polyps 04/14/2015  . Vitamin D deficiency 10/09/2014  . HLD (hyperlipidemia) 10/08/2012  . GERD (gastroesophageal reflux disease) 10/08/2012  . HTN (hypertension)   . Diverticulitis 10/25/2009   Rayetta Humphrey, PT CLT [phone] 12/04/2019, 3:17 PM  [facility] [address] [place], [name], [address] Phone: [phone]   Fax:  [phone]  Name: Logan Price MRN: [id number] Date of Birth: 1945-12-28  10/08/2018  . PSVT (paroxysmal supraventricular tachycardia) ([place]) 10/03/2018  . Atrial fibrillation ([place]) 10/03/2018  . Goals of care, counseling/discussion   . Dysphagia   . Loss of weight   . 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 Gastritis determined by endoscopy   . Esophageal dysphagia   . 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 Globus sensation 12/12/2016  . Smokes with greater than 40 pack year history 10/20/2016  . Hemorrhoids, internal, with bleeding 10/06/2015  . History of colonic polyps 04/14/2015  . Vitamin D deficiency 10/09/2014  . HLD (hyperlipidemia) 10/08/2012  . GERD (gastroesophageal reflux disease) 10/08/2012  . HTN (hypertension)   . Diverticulitis 10/25/2009   Rayetta Humphrey, PT CLT [phone] 12/04/2019, 3:17 PM  [facility] [address] [place], [name], [address] Phone: [phone]   Fax:  [phone]  Name: Logan Price MRN: [id number] Date of Birth: 1945-12-28  [place], [name], [address] Phone: [phone]   Fax:  [phone]  Physical Therapy Treatment  Patient Details  Name: Logan Price MRN: [id number] Date of Birth: February 06, 1946 Referring Provider (PT): Evelina Dun PHYSICAL THERAPY DISCHARGE SUMMARY  Visits from Start of Care: 8  Current functional level related to goals / functional outcomes: See below   Remaining deficits: ROM, balance    Education / Equipment: HEP  Plan:                                                    Patient goals were partially met. Patient is being discharged due to meeting the stated rehab goals.  ?????       PT End of Session - 12/04/19 1414    Visit Number 8    Number of Visits 8    Date for PT Re-Evaluation 12/05/19    Authorization Type Medicare/ Generic commercial secondary    Progress Note Due on Visit 8    PT Start Time 1400    PT Stop Time 1430    PT Time Calculation (min) 30 min    Equipment Utilized During Treatment --   SPC/ CGA wihtout AD   Activity Tolerance Patient tolerated treatment well;Patient limited by fatigue    Behavior During Therapy WFL for tasks assessed/performed           Past Medical History:  Diagnosis Date  . Diverticulitis   . Dysrhythmia 09/2018   episode of SVT while in hospital  . 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 GERD (gastroesophageal reflux disease)   . 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 HTN (hypertension)   . Hyperlipidemia     Past Surgical History:  Procedure Laterality Date  . BIOPSY  05/04/2015   Procedure: BIOPSY;  Surgeon: Danie Binder, MD;  [facility];  Service: Endoscopy;;  bx's of ileocecal valve   . BRONCHIAL BRUSHINGS  04/29/2019   Procedure: BRONCHIAL BRUSHINGS;  Surgeon: Garner Nash, DO;  Location: [facility];  Service: Pulmonary;;  . BRONCHIAL NEEDLE ASPIRATION BIOPSY  04/29/2019   Procedure: BRONCHIAL NEEDLE ASPIRATION  BIOPSIES;  Surgeon: Garner Nash, DO;  Location: [facility];  Service: Pulmonary;;  . BRONCHIAL WASHINGS  04/29/2019   Procedure: BRONCHIAL WASHINGS;  Surgeon: Garner Nash, DO;  Location: [facility];  Service: Pulmonary;;  . COLONOSCOPY WITH PROPOFOL N/A 05/04/2015   Dr. Oneida Alar: normal appearing ileum with prominent IC valve with tubular adenomas, moderate diverticulosis in sigmoid colon, ascending colon, and retum. Moderate sized internal hemorrhoids. Surveillance in 5 years  . ELECTROMAGNETIC NAVIGATION BROCHOSCOPY  04/29/2019   Procedure: NAVIGATION BRONCHOSCOPY;  Surgeon: Garner Nash, DO;  Location: [facility];  Service: Pulmonary;;  . ESOPHAGOGASTRODUODENOSCOPY (EGD) WITH PROPOFOL N/A 12/19/2016   Procedure: ESOPHAGOGASTRODUODENOSCOPY (EGD) WITH PROPOFOL;  Surgeon: Danie Binder, MD;  [facility];  Service: Endoscopy;  Laterality: N/A;  11:30am  . FLEXIBLE SIGMOIDOSCOPY N/A 12/10/2015   hemorrhoid banding X 3   . HEMORRHOID BANDING N/A 12/10/2015   Procedure: HEMORRHOID BANDING;  Surgeon: Danie Binder, MD;  [facility];  Service: Endoscopy;  Laterality: N/A;  1:30 PM  . INSERTION OF SUPRAPUBIC CATHETER N/A 02/24/2019   Procedure: INSERTION OF SUPRAPUBIC CATHETER;  10/08/2018  . PSVT (paroxysmal supraventricular tachycardia) ([place]) 10/03/2018  . Atrial fibrillation ([place]) 10/03/2018  . Goals of care, counseling/discussion   . Dysphagia   . Loss of weight   . 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 Gastritis determined by endoscopy   . Esophageal dysphagia   . 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 Globus sensation 12/12/2016  . Smokes with greater than 40 pack year history 10/20/2016  . Hemorrhoids, internal, with bleeding 10/06/2015  . History of colonic polyps 04/14/2015  . Vitamin D deficiency 10/09/2014  . HLD (hyperlipidemia) 10/08/2012  . GERD (gastroesophageal reflux disease) 10/08/2012  . HTN (hypertension)   . Diverticulitis 10/25/2009   Rayetta Humphrey, PT CLT [phone] 12/04/2019, 3:17 PM  [facility] [address] [place], [name], [address] Phone: [phone]   Fax:  [phone]  Name: Logan Price MRN: [id number] Date of Birth: 1945-12-28  10/08/2018  . PSVT (paroxysmal supraventricular tachycardia) ([place]) 10/03/2018  . Atrial fibrillation ([place]) 10/03/2018  . Goals of care, counseling/discussion   . Dysphagia   . Loss of weight   . 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 Gastritis determined by endoscopy   . Esophageal dysphagia   . 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 Globus sensation 12/12/2016  . Smokes with greater than 40 pack year history 10/20/2016  . Hemorrhoids, internal, with bleeding 10/06/2015  . History of colonic polyps 04/14/2015  . Vitamin D deficiency 10/09/2014  . HLD (hyperlipidemia) 10/08/2012  . GERD (gastroesophageal reflux disease) 10/08/2012  . HTN (hypertension)   . Diverticulitis 10/25/2009   Rayetta Humphrey, PT CLT [phone] 12/04/2019, 3:17 PM  [facility] [address] [place], [name], [address] Phone: [phone]   Fax:  [phone]  Name: Logan Price MRN: [id number] Date of Birth: 1945-12-28

## 2019-12-05 ENCOUNTER — Encounter (HOSPITAL_COMMUNITY): Payer: PRIVATE HEALTH INSURANCE | Admitting: Physical Therapy

## 2019-12-08 ENCOUNTER — Ambulatory Visit (HOSPITAL_COMMUNITY): Payer: Medicare Other | Admitting: Physical Therapy

## 2019-12-08 ENCOUNTER — Encounter (HOSPITAL_COMMUNITY): Payer: PRIVATE HEALTH INSURANCE | Admitting: Physical Therapy

## 2019-12-10 ENCOUNTER — Encounter (HOSPITAL_COMMUNITY): Payer: PRIVATE HEALTH INSURANCE | Admitting: Physical Therapy

## 2019-12-12 ENCOUNTER — Encounter (HOSPITAL_COMMUNITY): Payer: PRIVATE HEALTH INSURANCE | Admitting: Physical Therapy

## 2019-12-15 ENCOUNTER — Encounter (HOSPITAL_COMMUNITY): Payer: PRIVATE HEALTH INSURANCE | Admitting: Physical Therapy

## 2019-12-17 ENCOUNTER — Encounter (HOSPITAL_COMMUNITY): Payer: PRIVATE HEALTH INSURANCE | Admitting: Physical Therapy

## 2019-12-17 ENCOUNTER — Telehealth: Payer: Self-pay | Admitting: *Deleted

## 2019-12-17 NOTE — Telephone Encounter (Signed)
CALLED PATIENT TO INFORM THAT FU APPT. HAS BEEN MOVED TO 12-24-19 @ 1 PM WITH DR. Isidore Moos  DUE TO THE TEST BEING MOVED TO 12-22-19, SPOKE WITH PATIENT AND HE IS AWARE OF THESE CHANGES AND IS GOOD WITH THEM.

## 2019-12-18 DIAGNOSIS — Z8521 Personal history of malignant neoplasm of larynx: Secondary | ICD-10-CM | POA: Diagnosis not present

## 2019-12-18 DIAGNOSIS — R1312 Dysphagia, oropharyngeal phase: Secondary | ICD-10-CM | POA: Diagnosis not present

## 2019-12-19 ENCOUNTER — Encounter (HOSPITAL_COMMUNITY): Payer: PRIVATE HEALTH INSURANCE | Admitting: Physical Therapy

## 2019-12-19 ENCOUNTER — Ambulatory Visit: Payer: Self-pay | Admitting: Radiation Oncology

## 2019-12-19 ENCOUNTER — Telehealth: Payer: Medicare Other

## 2019-12-22 ENCOUNTER — Encounter (HOSPITAL_COMMUNITY): Payer: PRIVATE HEALTH INSURANCE | Admitting: Physical Therapy

## 2019-12-22 ENCOUNTER — Other Ambulatory Visit: Payer: Self-pay

## 2019-12-22 ENCOUNTER — Ambulatory Visit (HOSPITAL_COMMUNITY)
Admission: RE | Admit: 2019-12-22 | Discharge: 2019-12-22 | Disposition: A | Discharge status: Home / Self Care | Payer: Medicare Other | Source: Ambulatory Visit | Attending: Radiation Oncology | Admitting: Radiation Oncology

## 2019-12-22 DIAGNOSIS — I700 Atherosclerosis of aorta: Secondary | ICD-10-CM | POA: Diagnosis not present

## 2019-12-22 DIAGNOSIS — I2510 Atherosclerotic heart disease of native coronary artery without angina pectoris: Secondary | ICD-10-CM | POA: Diagnosis not present

## 2019-12-22 DIAGNOSIS — J700 Acute pulmonary manifestations due to radiation: Secondary | ICD-10-CM | POA: Diagnosis not present

## 2019-12-22 DIAGNOSIS — C3411 Malignant neoplasm of upper lobe, right bronchus or lung: Secondary | ICD-10-CM

## 2019-12-22 DIAGNOSIS — C3412 Malignant neoplasm of upper lobe, left bronchus or lung: Secondary | ICD-10-CM | POA: Diagnosis not present

## 2019-12-22 DIAGNOSIS — C7800 Secondary malignant neoplasm of unspecified lung: Secondary | ICD-10-CM | POA: Diagnosis not present

## 2019-12-24 ENCOUNTER — Encounter (HOSPITAL_COMMUNITY): Payer: PRIVATE HEALTH INSURANCE | Admitting: Physical Therapy

## 2019-12-24 ENCOUNTER — Ambulatory Visit
Admission: RE | Admit: 2019-12-24 | Discharge: 2019-12-24 | Disposition: A | Discharge status: Home / Self Care | Payer: Medicare Other | Source: Ambulatory Visit | Attending: Radiation Oncology | Admitting: Radiation Oncology

## 2019-12-24 ENCOUNTER — Inpatient Hospital Stay: Payer: Medicare Other | Attending: Hematology

## 2019-12-24 ENCOUNTER — Other Ambulatory Visit: Payer: Self-pay

## 2019-12-24 VITALS — BP 173/95 | HR 80 | Temp 97.8°F | Resp 20 | Ht 69.0 in | Wt 162.2 lb

## 2019-12-24 DIAGNOSIS — Z8521 Personal history of malignant neoplasm of larynx: Secondary | ICD-10-CM | POA: Insufficient documentation

## 2019-12-24 DIAGNOSIS — I700 Atherosclerosis of aorta: Secondary | ICD-10-CM | POA: Diagnosis not present

## 2019-12-24 DIAGNOSIS — J439 Emphysema, unspecified: Secondary | ICD-10-CM | POA: Insufficient documentation

## 2019-12-24 DIAGNOSIS — Z23 Encounter for immunization: Secondary | ICD-10-CM

## 2019-12-24 DIAGNOSIS — I2510 Atherosclerotic heart disease of native coronary artery without angina pectoris: Secondary | ICD-10-CM | POA: Diagnosis not present

## 2019-12-24 DIAGNOSIS — Z85118 Personal history of other malignant neoplasm of bronchus and lung: Secondary | ICD-10-CM | POA: Diagnosis not present

## 2019-12-24 DIAGNOSIS — Z08 Encounter for follow-up examination after completed treatment for malignant neoplasm: Secondary | ICD-10-CM | POA: Insufficient documentation

## 2019-12-24 DIAGNOSIS — C3491 Malignant neoplasm of unspecified part of right bronchus or lung: Secondary | ICD-10-CM

## 2019-12-24 DIAGNOSIS — Z923 Personal history of irradiation: Secondary | ICD-10-CM | POA: Insufficient documentation

## 2019-12-24 DIAGNOSIS — C3492 Malignant neoplasm of unspecified part of left bronchus or lung: Secondary | ICD-10-CM

## 2019-12-24 DIAGNOSIS — C320 Malignant neoplasm of glottis: Secondary | ICD-10-CM | POA: Diagnosis not present

## 2019-12-24 DIAGNOSIS — C3411 Malignant neoplasm of upper lobe, right bronchus or lung: Secondary | ICD-10-CM

## 2019-12-24 DIAGNOSIS — Z79899 Other long term (current) drug therapy: Secondary | ICD-10-CM | POA: Insufficient documentation

## 2019-12-24 DIAGNOSIS — R635 Abnormal weight gain: Secondary | ICD-10-CM

## 2019-12-24 MED ORDER — INFLUENZA VAC A&B SA ADJ QUAD 0.5 ML IM PRSY
0.5000 mL | PREFILLED_SYRINGE | Freq: Once | INTRAMUSCULAR | Status: AC
  Administered 2019-12-24: 0.5 mL via INTRAMUSCULAR
  Filled 2019-12-24: qty 0.5

## 2019-12-24 NOTE — Progress Notes (Signed)
Mr. Holliman returns for follow-up of radiation to the larynx completed on 12/10/2018 and SBRT to the right and left lungs on 06/13/2019, and to receive CT scan results from 12/23/2019  Pain issues, if any: Denies any throat or chest pain. Reports chronic lower back pain (3 out of 10) Using a feeding tube?: No--removed on 10/08/2019 in IR Weight changes, if any:  Wt Readings from Last 3 Encounters:  12/24/19 162 lb 3.2 oz (73.6 kg)  10/07/19 152 lb (68.9 kg)  09/23/19 154 lb 4 oz (70 kg)   Swallowing issues, if any: Patient denies. Reports he can eat/drink all types of foods and beverages.  Smoking or chewing tobacco? None Using fluoride trays daily? N/A Last ENT visit was on: Had F/U with Dr. Leta Baptist last week. Patient reports Dr. Benjamine Mola did a laryngoscopy and reported "no signs of cancer". Will continue F/U with Dr. Benjamine Mola every 6 months  Other notable issues, if any: Patient reports mild swelling to throat (and states Dr. Benjamine Mola reported the same). Continues to deal with minor dry mouth and thick saliva. Otherwise patient reports he feels good and is satisfied with his recovery thus far.  Vitals:   12/24/19 1304  BP: (!) 173/95  Pulse: 80  Resp: 20  Temp: 97.8 F (36.6 C)  SpO2: 96%

## 2019-12-24 NOTE — Progress Notes (Signed)
Covid-19 Vaccination Clinic  Name:  APOLLO MACHER    MRN: [id number] DOB: 21-Sep-1945  12/24/2019  Mr. Poarch was observed post Covid-19 immunization for 15 minutes without incident. He was provided with Vaccine Information Sheet and instruction to access the V-Safe system.   Mr. Simrell was instructed to call 911 with any severe reactions post vaccine: Marland Kitchen Difficulty breathing  . Swelling of face and throat  . A fast heartbeat  . A bad rash all over body  . Dizziness and weakness

## 2019-12-25 LAB — TSH: TSH: 4.026 u[IU]/mL (ref 0.320–4.118)

## 2019-12-26 ENCOUNTER — Encounter (HOSPITAL_COMMUNITY): Payer: PRIVATE HEALTH INSURANCE | Admitting: Physical Therapy

## 2019-12-29 ENCOUNTER — Encounter: Payer: Self-pay | Admitting: Radiation Oncology

## 2019-12-29 ENCOUNTER — Telehealth: Payer: Self-pay | Admitting: *Deleted

## 2019-12-29 ENCOUNTER — Other Ambulatory Visit: Payer: Self-pay | Admitting: Radiation Oncology

## 2019-12-29 DIAGNOSIS — C3492 Malignant neoplasm of unspecified part of left bronchus or lung: Secondary | ICD-10-CM

## 2019-12-29 DIAGNOSIS — C3491 Malignant neoplasm of unspecified part of right bronchus or lung: Secondary | ICD-10-CM

## 2019-12-29 NOTE — Telephone Encounter (Signed)
Patient called stating that BP medication was stopped once started chemo.  Patient would like to know if he needs to start BP medication   128/78  137/74 173/95 166/94

## 2019-12-29 NOTE — Telephone Encounter (Signed)
Patient aware and verbalizes understanding. 

## 2019-12-29 NOTE — Telephone Encounter (Signed)
We will hold off on medication at this time. Continue to monitor at home.

## 2019-12-29 NOTE — Progress Notes (Signed)
Radiation Oncology         [phone] ________________________________  Name: COREON WEINSCHENK MRN: [id number]  Date: 12/24/2019  DOB: Dec 24, 1945  Follow-Up Visit Note  CC: [name], FNP  Newman Pies, MD  Diagnosis and Prior Radiotherapy:       ICD-10-CM   1. 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 Malignant neoplasm of right upper lobe of lung (HCC)  C34.11   3. 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HCC)  C34.92   4. Weight gain  R63.5 TSH  5. Flu vaccine need  Z23 influenza vaccine adjuvanted (FLUAD) injection 0.5 mL  6. Squamous cell lung cancer, right (HCC)  C34.91     Radiation Treatment Dates: 10/22/2018 through 12/10/2018 Site Technique Total Dose Dose per Fx Completed Fx Beam Energies  Head & neck: HN_larynx IMRT 70/70 2 35/35 6X   Radiation Treatment Dates: 06/03/2019 through 06/13/2019 Site Technique Total Dose (Gy) Dose per Fx (Gy) Completed Fx Beam Energies  Lung, Right: Lung_Rt_Upper IMRT 60/60 12 5/5 6XFFF  Lung, Left: Lung_Lt_Upper IMRT 60/60 12 5/5 6XFFF    CHIEF COMPLAINT:  Here for follow-up and surveillance of throat and lung cancer   Narrative:      Mr. Romeo returns for follow-up of radiation to the larynx completed on 12/10/2018 and SBRT to the right and left lungs on 06/13/2019, and to receive CT scan results from 12/23/2019  Pain issues, if any: Denies any throat or chest pain. Reports chronic lower back pain (3 out of 10) Using a feeding tube?: No--removed on 10/08/2019 in IR Weight changes, if any:  Wt Readings from Last 3 Encounters:  12/24/19 162 lb 3.2 oz (73.6 kg)  10/07/19 152 lb (68.9 kg)  09/23/19 154 lb 4 oz (70 kg)   Swallowing issues, if any: Patient denies. Reports he can eat/drink all types of foods and beverages.  Smoking or chewing tobacco? None Using fluoride trays daily? N/A Last ENT visit was on: Had F/U with Dr. Newman Pies last week. Patient reports Dr. Suszanne Conners did a laryngoscopy and reported "no signs of cancer". Will continue  F/U with Dr. Suszanne Conners every 6 months  Other notable issues, if any: Patient reports mild swelling to throat (and states Dr. Suszanne Conners reported the same). Continues to deal with minor dry mouth and thick saliva. Otherwise patient reports he feels good and is satisfied with his recovery thus far.  ALLERGIES:  has No Known Allergies.  Meds: Current Outpatient Medications  Medication Sig Dispense Refill  . diclofenac (VOLTAREN) 75 MG EC tablet TAKE 1 TABLET BY MOUTH TWICE A DAY 60 tablet 2  . doxazosin (CARDURA) 1 MG tablet Place 1 tablet (1 mg total) into feeding tube daily. (Patient taking differently: Take 1 mg by mouth daily. ) 30 tablet 5  . pantoprazole (PROTONIX) 40 MG tablet TAKE ONE TABLET DAILY 30 MINUTES PRIOR TO THE FIRST MEAL 90 tablet 2  . simvastatin (ZOCOR) 20 MG tablet TAKE 1 TABLET DAILY AT 6PM 90 tablet 1   No current facility-administered medications for this encounter.    Physical Findings: The patient is in no acute distress. Patient is alert and oriented. Wt Readings from Last 3 Encounters:  12/24/19 162 lb 3.2 oz (73.6 kg)  10/07/19 152 lb (68.9 kg)  09/23/19 154 lb 4 oz (70 kg)    height is 5\' 9"  (1.753 m) and weight is 162 lb 3.2 oz (73.6 kg). His temperature is 97.8 F (36.6 C). His blood pressure is  173/95 (abnormal) and his pulse is 80. His respiration is 20 and oxygen saturation is 96%. .  General: Alert and oriented, in no acute distress.  No respiratory distress HEENT: Head is normocephalic. Extraocular movements are intact. Oropharynx is notable for no thrush or lesions Neck: no palpable adenopathy Chest: CTAB Heart: RRR, no murmurs Psychiatric: Judgment and insight are intact. Affect is appropriate.   Lab Findings: Lab Results  Component Value Date   WBC 10.1 04/30/2019   HGB 11.8 (L) 04/30/2019   HCT 37.7 (L) 04/30/2019   MCV 90.4 04/30/2019   PLT 212 04/30/2019    Lab Results  Component Value Date   TSH 4.026 12/24/2019    Radiographic  Findings: CT Chest Wo Contrast  Result Date: 12/22/2019 CLINICAL DATA:  Head and neck cancer, lung metastases, radiation therapy complete EXAM: CT CHEST WITHOUT CONTRAST TECHNIQUE: Multidetector CT imaging of the chest was performed following the standard protocol without IV contrast. COMPARISON:  09/18/2019 FINDINGS: Cardiovascular: Aortic atherosclerosis. Normal heart size. Extensive 3 vessel coronary artery calcifications. No pericardial effusion. Mediastinum/Nodes: No enlarged mediastinal, hilar, or axillary lymph nodes. Thyroid gland, trachea, and esophagus demonstrate no significant findings. Lungs/Pleura: Moderate centrilobular emphysema. Continued interval decrease in size of a spiculated nodule of the left left upper lobe containing a fiducial marker, measuring 1.0 x 0.9 cm, previously 1.3 x 0.9 cm (series 7, image 84). Interval development of surrounding radiation fibrosis. Interval decrease in size of a subpleural nodule of the anterior right upper lobe abutting the minor fissure, measuring 1.3 x 1.3 cm, previously 1.6 x 1.4 cm when measured similarly (series 7, image 89). Mild radiation fibrosis at the lung apices. Tiny, definitively benign calcified nodules of the right lung base (series 7, image 121). No pleural effusion or pneumothorax. Upper Abdomen: No acute abnormality. Musculoskeletal: No chest wall mass or suspicious bone lesions identified. IMPRESSION: 1. Continued interval decrease in size of left upper lobe and right upper lobe pulmonary nodules following radiation therapy, with development of adjacent radiation fibrosis. 2.  No evidence of new metastatic disease in the chest. 3.  Emphysema (ICD10-J43.9). 4.  Coronary artery disease. Aortic Atherosclerosis (ICD10-I70.0) Electronically Signed   By: Lauralyn Primes M.D.   On: 12/22/2019 17:03    Impression/Plan:    1) Head and Neck Cancer Status: No evidence of progression or recurrence in the neck or chest per recent CT scans.  No evidence  of disease on physical exam.  I personally reviewed his images.  He is doing very well and looks as healthy as have ever seen him.  2) Nutritional Status: He is gaining weight and has had his PEG tube removed   3) Risk Factors: The patient has been educated about risk factors including alcohol and tobacco abuse; they understand that avoidance of alcohol and tobacco is important to prevent recurrences as well as other cancers  4) Swallowing: continue SLP exercises, swallowing has improved since trach removal  5)  Thyroid function: will check annually- within normal limits today Lab Results  Component Value Date   TSH 4.026 12/24/2019    6)  Follow-up in ~3 months with repeat chest CT. I will arrange for the CT chest to take place in late December and he will see me back in early January, per his preferences.  Continue close follow-up with otolaryngology.   7) We discussed measures to reduce the risk of infection during the COVID-19 pandemic.  We also discussed the importance of the flu vaccine.  We will arrange  for him to receive his flu vaccine and his Covid booster today (he has already received 2 Pfizer vaccines).  He is appreciative of this opportunity.  On date of service, in total, I spent 31 minutes on this encounter.  Patient was seen face-to-face _____________________________________   Lonie Peak, MD

## 2019-12-29 NOTE — Progress Notes (Signed)
Oncology Nurse Navigator Documentation  I received a phone call from Logan Price asking to have his oxygen tank picked up because he has not used his since his tracheostomy was removed. He provided me with a number to call that was listed on his oxygen tank. I called and spoke to a representative of the [facility] agency and they informed me that they would need a doctors order to pick up the equipment. I then contacted the office of Raeford Razor FNP who is Mr. Spiers's PCP to request an order. They verbalized that they would contact the [facility] agency and manage Mr. Alguire's needs regarding the equipment he has at home.  Harlow Asa RN, BSN, OCN Head & Neck Oncology Nurse [place] at [facility] Phone # [phone]  Fax # [phone]

## 2020-01-28 ENCOUNTER — Ambulatory Visit: Payer: Medicare Other | Admitting: Licensed Clinical Social Worker

## 2020-01-28 DIAGNOSIS — E559 Vitamin D deficiency, unspecified: Secondary | ICD-10-CM

## 2020-01-28 DIAGNOSIS — I10 Essential (primary) hypertension: Secondary | ICD-10-CM

## 2020-01-28 DIAGNOSIS — K219 Gastro-esophageal reflux disease without esophagitis: Secondary | ICD-10-CM

## 2020-01-28 DIAGNOSIS — E785 Hyperlipidemia, unspecified: Secondary | ICD-10-CM

## 2020-01-28 NOTE — Patient Instructions (Addendum)
Licensed Clinical Social Worker Visit Information  Goals we discussed today:  .  Client will talk with LCSW in next 30 days to discuss ADLs completion for client (pt-stated)        CARE PLAN ENTRY   Current Barriers:   Atrial Fibrillation in client with Chronic Diagnoses of Adenocarcinoma of left lung, Vitamin D deficiency, GERD, HTN, HLD  Clinical Social Work Clinical Goal(s):   LCSW will call client in next 30 days to discuss client completion of ADLs  Interventions:    Talked with client about pain issues in his lower back and legs  Talked with client about mobility issues  Talked with Devin about his appetite (he said he is eating well)  Talked with client about transport needs of client  Talked with client about social support network (has support from his spouse and from his brother)  Talked with client about ADLs completion of client  Talked with client about upcoming medical appointments   Talked with client about physical therapy sessions received by client  Talked with Felicie Morn about his skin condition at present  Encouraged Emori to talk with RNCM as needed regarding nursing needs of client  Talked with Johncharles about his history of treatments for cancer  Talked with Kiren about ambulation needs (he uses walker as needed for ambulation assistance)  Talked with Timur about vision of client  Talked with Maze about relaxation techniques (likes to watch TV)  Patient Self Care Activities:   Attends scheduled medical appointments Drives to medical appointments and drives to complete errands needed  Patient Self Care Deficits:  Mobility issues Pain issues   Initial goal documentation     Follow Up Plan: LCSW to call client in next 4 weeks to talk with client about client completion of daily ADLs  Materials Provided: No  The patient verbalized understanding of instructions provided today and declined a print copy of patient instruction  materials.   Norva Riffle.Elfego Giammarino MSW, LCSW Licensed Clinical Social Worker [facility] [phone]

## 2020-01-28 NOTE — Chronic Care Management (AMB) (Addendum)
Chronic Care Management    Clinical Social Work Follow Up Note  01/28/2020 Name: Logan Price MRN: [id number] DOB: 1945/12/01  Logan Price is a 74 y.o. year old male who is a primary care patient of Logan Spencer, FNP. The CCM team was consulted for assistance with Logan Price .   Review of patient status, including review of consultants reports, other relevant assessments, and collaboration with appropriate care team members and the patient's provider was performed as part of comprehensive patient evaluation and provision of chronic care management services.    SDOH (Social Determinants of Health) assessments performed: No;risk for depression; risk for tobacco use; risk for stress; risk for physical inactivity    Chronic Care Management from 09/08/2019 in [facility]  PHQ-9 Total Score 2         GAD 7 : Generalized Anxiety Score 09/08/2019  Nervous, Anxious, on Edge 0  Control/stop worrying 0  Worry too much - different things 0  Trouble relaxing 0  Restless 0  Easily annoyed or irritable 0  Afraid - awful might happen 0  Total GAD 7 Score 0  Anxiety Difficulty Somewhat difficult    Outpatient Encounter Medications as of 01/28/2020  Medication Sig   diclofenac (VOLTAREN) 75 MG EC tablet TAKE 1 TABLET BY MOUTH TWICE A DAY   doxazosin (CARDURA) 1 MG tablet Place 1 tablet (1 mg total) into feeding tube daily. (Patient taking differently: Take 1 mg by mouth daily. )   pantoprazole (PROTONIX) 40 MG tablet TAKE ONE TABLET DAILY 30 MINUTES PRIOR TO THE FIRST MEAL   simvastatin (ZOCOR) 20 MG tablet TAKE 1 TABLET DAILY AT 6PM   No facility-administered encounter medications on file as of 01/28/2020.    Goals       Client will talk with LCSW in next 30 days to discuss ADLs completion for client (pt-stated)      CARE PLAN ENTRY   Current Barriers:  Atrial Fibrillation in client with Chronic Diagnoses of Adenocarcinoma of left lung, Vitamin D  deficiency, GERD, HTN, HLD  Clinical Social Work Clinical Goal(s):  LCSW will call client in next 30 days to discuss client completion of ADLs  Interventions:   Talked with client about pain issues in his lower back and legs Talked with client about mobility issues Talked with Logan Price about his appetite (he said he is eating well) Talked with client about transport needs of client Talked with client about social support network (has support from his spouse and from his brother) Talked with client about ADLs completion of client Talked with client about upcoming medical appointments  Talked with client about physical therapy sessions received by client Talked with Logan Price about his skin condition at present Encouraged Logan Price to talk with RNCM as needed regarding nursing needs of client Talked with Logan Price about his history of treatments for cancer Talked with Logan Price about ambulation needs (he uses walker as needed for ambulation assistance) Talked with Logan Price about vision of client Talked with Logan Price about relaxation techniques (likes to watch TV)  Patient Self Care Activities:   Attends scheduled medical appointments Drives to medical appointments and drives to complete errands needed  Patient Self Care Deficits:  Mobility issues Pain issues   Initial goal documentation     Follow Up Plan: LCSW to call client in next 4 weeks to talk with client about client completion of daily ADLs  Logan Price.Logan Price MSW, LCSW Licensed Clinical Social Worker [facility]  [phone]  I have reviewed the CCM documentation and agree with the written assessment and plan of care.  Logan Rodney, FNP

## 2020-02-18 ENCOUNTER — Telehealth: Payer: Self-pay

## 2020-02-18 DIAGNOSIS — M545 Low back pain, unspecified: Secondary | ICD-10-CM

## 2020-02-18 NOTE — Telephone Encounter (Signed)
Patient wife is wanting MRI of his back or ortho referral.

## 2020-02-21 ENCOUNTER — Other Ambulatory Visit: Payer: Self-pay | Admitting: Family

## 2020-02-21 DIAGNOSIS — M5442 Lumbago with sciatica, left side: Secondary | ICD-10-CM

## 2020-02-23 NOTE — Telephone Encounter (Signed)
Patient aware.

## 2020-02-23 NOTE — Telephone Encounter (Signed)
Referral to Ortho placed.

## 2020-03-02 ENCOUNTER — Telehealth: Payer: Medicare Other

## 2020-03-10 ENCOUNTER — Other Ambulatory Visit: Payer: Self-pay

## 2020-03-10 ENCOUNTER — Ambulatory Visit (INDEPENDENT_AMBULATORY_CARE_PROVIDER_SITE_OTHER): Payer: Medicare Other | Admitting: Orthopaedic Surgery

## 2020-03-10 ENCOUNTER — Encounter: Payer: Self-pay | Admitting: Orthopaedic Surgery

## 2020-03-10 VITALS — Ht 69.0 in | Wt 163.0 lb

## 2020-03-10 DIAGNOSIS — M5441 Lumbago with sciatica, right side: Secondary | ICD-10-CM

## 2020-03-10 DIAGNOSIS — M5442 Lumbago with sciatica, left side: Secondary | ICD-10-CM

## 2020-03-10 DIAGNOSIS — M545 Low back pain, unspecified: Secondary | ICD-10-CM

## 2020-03-10 DIAGNOSIS — G8929 Other chronic pain: Secondary | ICD-10-CM | POA: Diagnosis not present

## 2020-03-10 NOTE — Progress Notes (Signed)
Office Visit Note   Patient: Logan Price           Date of Birth: 09-28-1945           MRN: [id number] Visit Date: 03/10/2020              Requested by: Logan Spencer, FNP [address] [place],  [place] [address] PCP: Logan Spencer, FNP   Assessment & Plan: Visit Diagnoses:  1. Chronic midline low back pain, unspecified whether sciatica present   2. Chronic bilateral low back pain with bilateral sciatica     Plan: Logan Price is a history of chronic low back pain.  He had a initial onset of insidious discomfort in 2020.  Has been seen by his primary care physician and a course of physical therapy was prescribed.  Logan Price relates it did not seem to help.  His x-rays demonstrate significant degenerative changes with very minimal scoliosis.  He does have diffuse calcification of the abdominal aorta.  I do not think he is really having claudication but he certainly seems to have some radicular discomfort.  I think at this point is worth obtaining an MRI scan to further define the problem. Follow-Up Instructions: Return After MRI scan lumbar spine.   Orders:  Orders Placed This Encounter  Procedures  . MR Lumbar Spine w/o contrast   No orders of the defined types were placed in this encounter.     Procedures: No procedures performed   Clinical Data: No additional findings.   Subjective: Chief Complaint  Patient presents with  . Lower Back - Pain  Patient presents today for lower back pain. He said that it started to hurt in 2020. No known injury. He states that it travels down the backside of both legs to the level of his knees. No numbness or tingling in his lower extremities. He said that both legs feel weak. His pain in his back is worse upon getting up after sitting or rest. Nothing seems to make his pain better. He has a history of throat and lung cancer. He has finished his treatments and currently in remission. He has x-rays of his lower back that were taken  in July. Films of his lower back were evaluated on the PACS system.  There is minimal scoliosis of the disc spaces are well-maintained.  There are some facet sclerosis and some anterior vertebral body osteophyte formation.  There is also diffuse calcification of the abdominal aorta without obvious aneurysmal dilatation. Has history of laryngal and lung cancer presently in remission after treatment.  History of prior smoking  HPI  Review of Systems   Objective: Vital Signs: Ht 5\' 9"  (1.753 m)   Wt 163 lb (73.9 kg)   BMI 24.07 kg/m   Physical Exam Constitutional:      Appearance: He is well-developed and well-nourished.  HENT:     Mouth/Throat:     Mouth: Oropharynx is clear and moist.  Eyes:     Extraocular Movements: EOM normal.     Pupils: Pupils are equal, round, and reactive to light.  Pulmonary:     Effort: Pulmonary effort is normal.  Skin:    General: Skin is warm and dry.  Neurological:     Mental Status: He is alert and oriented to person, place, and time.  Psychiatric:        Mood and Affect: Mood and affect normal.        Behavior: Behavior normal.  Ortho Exam awake alert and oriented x3 comfortable sitting.  No acute distress.  Painless range of motion both knees and both hips.  Straight leg raise negative.  No percussible tenderness of the lumbar spine good strength in both lower extremities.  Feet were warm  Specialty Comments:  No specialty comments available.  Imaging: No results found.   PMFS History: Patient Active Problem List   Diagnosis Date Noted  . Low back pain 03/10/2020  . Malignant neoplasm of right upper lobe of lung (HCC) 05/27/2019  . Primary cancer of left upper lobe of lung (HCC) 05/27/2019  . Pseudomonas aeruginosa infection 05/21/2019  . Tracheobronchitis 05/21/2019  . Hx of radiation therapy 05/21/2019  . 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HCC) 05/21/2019  . Squamous cell lung cancer, right (HCC) 05/21/2019  . Adenocarcinoma,  lung, left (HCC) 05/21/2019  . Lung nodule 04/23/2019  . Ground glass opacity present on imaging of lung 04/23/2019  . Head and neck cancer (HCC) 04/23/2019  . Tracheostomy tube present (HCC) 04/23/2019  . PEG (percutaneous endoscopic gastrostomy) adjustment/replacement/removal (HCC) 03/08/2019  . 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 Pleural effusion   . 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HCC)   . Bacteremia   . Leukocytosis   . Normocytic anemia   . Protein-calorie malnutrition, severe 10/08/2018  . PSVT (paroxysmal supraventricular tachycardia) (HCC) 10/03/2018  . Atrial fibrillation (HCC) 10/03/2018  . Goals of care, counseling/discussion   . Dysphagia   . Loss of weight   . 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 Gastritis determined by endoscopy   . Esophageal dysphagia   . 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 Globus sensation 12/12/2016  . Smokes with greater than 40 pack year history 10/20/2016  . Hemorrhoids, internal, with bleeding 10/06/2015  . History of colonic polyps 04/14/2015  . Vitamin D deficiency 10/09/2014  . HLD (hyperlipidemia) 10/08/2012  . GERD (gastroesophageal reflux disease) 10/08/2012  . HTN (hypertension)   . Diverticulitis 10/25/2009   Past Medical History:  Diagnosis Date  . Diverticulitis   . Dysrhythmia 09/2018   episode of SVT while in hospital  . 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 GERD (gastroesophageal reflux disease)   . 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 HTN (hypertension)   . Hyperlipidemia     Family History  Problem Relation Age of Onset  . Cancer Mother   . Hypertension Father   . Hypertension Sister   . Hypertension Brother   . Hypertension Sister   . Aneurysm Brother 32       brain  . Colon cancer Neg Hx   . Colon polyps Neg Hx     Past Surgical History:  Procedure Laterality Date  . BIOPSY  05/04/2015   Procedure: BIOPSY;  Surgeon: West Bali, MD;  [facility];  Service: Endoscopy;;  bx's of ileocecal valve   . BRONCHIAL BRUSHINGS  04/29/2019   Procedure: BRONCHIAL BRUSHINGS;  Surgeon: Josephine Igo, DO;  [facility];  Service: Pulmonary;;  . BRONCHIAL NEEDLE ASPIRATION BIOPSY  04/29/2019   Procedure: BRONCHIAL NEEDLE ASPIRATION BIOPSIES;  Surgeon: Josephine Igo, DO;  [facility];  Service: Pulmonary;;  . BRONCHIAL WASHINGS  04/29/2019   Procedure: BRONCHIAL WASHINGS;  Surgeon: Josephine Igo, DO;  [facility];  Service: Pulmonary;;  . COLONOSCOPY WITH PROPOFOL N/A 05/04/2015   Dr. Darrick Penna: normal appearing ileum with prominent IC valve with tubular adenomas, moderate diverticulosis in  sigmoid colon, ascending colon, and retum. Moderate sized internal hemorrhoids. Surveillance in 5 years  . ELECTROMAGNETIC NAVIGATION BROCHOSCOPY  04/29/2019   Procedure: NAVIGATION BRONCHOSCOPY;  Surgeon: Josephine Igo, DO;  [facility];  Service: Pulmonary;;  . ESOPHAGOGASTRODUODENOSCOPY (EGD) WITH PROPOFOL N/A 12/19/2016   Procedure: ESOPHAGOGASTRODUODENOSCOPY (EGD) WITH PROPOFOL;  Surgeon: West Bali, MD;  [facility];  Service: Endoscopy;  Laterality: N/A;  11:30am  . FLEXIBLE SIGMOIDOSCOPY N/A 12/10/2015   hemorrhoid banding X 3   . HEMORRHOID BANDING N/A 12/10/2015   Procedure: HEMORRHOID BANDING;  Surgeon: West Bali, MD;  [facility];  Service: Endoscopy;  Laterality: N/A;  1:30 PM  . INSERTION OF SUPRAPUBIC CATHETER N/A 02/24/2019   Procedure: INSERTION OF SUPRAPUBIC CATHETER;  Surgeon: Ihor Gully, MD;  Location: [facility];  Service: Urology;  Laterality: N/A;  . IR CM INJ ANY COLONIC TUBE W/FLUORO  10/30/2018  . IR GASTROSTOMY TUBE MOD SED  10/04/2018  . IR GASTROSTOMY TUBE REMOVAL  10/08/2019  . IR IMAGING GUIDED PORT INSERTION  10/04/2018  . IR REMOVAL TUN ACCESS W/ PORT W/O FL MOD SED  10/14/2018  . IR REPLACE G-TUBE SIMPLE WO FLUORO  11/03/2018  . IR REPLACE G-TUBE SIMPLE WO  FLUORO  07/28/2019  . LAPAROSCOPIC INSERTION GASTROSTOMY TUBE Left 10/07/2018   Procedure: LAPAROSCOPIC  GASTROSTOMY TUBE;  Surgeon: Kinsinger, De Blanch, MD;  [facility];  Service: General;  Laterality: Left;  Marland Kitchen MICROLARYNGOSCOPY N/A 09/27/2018   Procedure: MICRO DIRECT LARYNGOSCOPY WITH BIOPSY;  Surgeon: Newman Pies, MD;  Location: [facility] OR;  Service: ENT;  Laterality: N/A;  . None to date     As of 04/14/15  . POLYPECTOMY  05/04/2015   Procedure: POLYPECTOMY;  Surgeon: West Bali, MD;  [facility];  Service: Endoscopy;;  descending colon polyp, ascending colon polyp  . SAVORY DILATION N/A 12/19/2016   Procedure: SAVORY DILATION;  Surgeon: West Bali, MD;  [facility];  Service: Endoscopy;  Laterality: N/A;  . TRACHEOSTOMY TUBE PLACEMENT N/A 09/27/2018   Procedure: AWAKE TRACHEOSTOMY;  Surgeon: Newman Pies, MD;  [facility];  Service: ENT;  Laterality: N/A;  . TRANSURETHRAL RESECTION OF PROSTATE N/A 02/24/2019   Procedure: TRANSURETHRAL RESECTION OF THE PROSTATE (TURP);  Surgeon: Ihor Gully, MD;  [facility];  Service: Urology;  Laterality: N/A;  . VIDEO BRONCHOSCOPY WITH ENDOBRONCHIAL NAVIGATION N/A 04/29/2019   Procedure: VIDEO BRONCHOSCOPY;  Surgeon: Josephine Igo, DO;  [facility];  Service: Pulmonary;  Laterality: N/A;   Social History   Occupational History  . Occupation: Retired    Comment: [employer]  Tobacco Use  . Smoking status: Former Smoker    Packs/day: 1.00    Years: 40.00    Pack years: 40.00    Types: Cigarettes    Quit date: 10/09/2006    Years since quitting: 13.4  . Smokeless tobacco: Never Used  Vaping Use  . Vaping Use: Never used  Substance and Sexual Activity  . Alcohol use: Not Currently    Alcohol/week: 0.0 standard drinks    Comment: he denies 01/06/19  . Drug use: No  . Sexual activity: Yes    Partners: Female    Birth control/protection: None

## 2020-03-24 ENCOUNTER — Other Ambulatory Visit: Payer: Self-pay

## 2020-03-24 ENCOUNTER — Ambulatory Visit (HOSPITAL_COMMUNITY)
Admission: RE | Admit: 2020-03-24 | Discharge: 2020-03-24 | Disposition: A | Discharge status: Home / Self Care | Payer: Medicare Other | Source: Ambulatory Visit | Attending: Orthopaedic Surgery | Admitting: Orthopaedic Surgery

## 2020-03-24 DIAGNOSIS — M545 Low back pain, unspecified: Secondary | ICD-10-CM | POA: Insufficient documentation

## 2020-03-24 DIAGNOSIS — G8929 Other chronic pain: Secondary | ICD-10-CM | POA: Insufficient documentation

## 2020-03-25 ENCOUNTER — Ambulatory Visit (HOSPITAL_COMMUNITY)
Admission: RE | Admit: 2020-03-25 | Discharge: 2020-03-25 | Disposition: A | Discharge status: Home / Self Care | Payer: Medicare Other | Source: Ambulatory Visit | Attending: Radiation Oncology | Admitting: Radiation Oncology

## 2020-03-25 DIAGNOSIS — C3491 Malignant neoplasm of unspecified part of right bronchus or lung: Secondary | ICD-10-CM | POA: Insufficient documentation

## 2020-03-25 DIAGNOSIS — C3492 Malignant neoplasm of unspecified part of left bronchus or lung: Secondary | ICD-10-CM | POA: Insufficient documentation

## 2020-03-29 ENCOUNTER — Telehealth: Payer: Self-pay | Admitting: *Deleted

## 2020-03-29 NOTE — Telephone Encounter (Signed)
CALLED PATIENT TO ASK IF HE WOULD BE INTERESTED IN A TELEMEDICINE VISIT FOR 03-30-20, LVM FOR A RETURN CALL

## 2020-03-30 ENCOUNTER — Other Ambulatory Visit: Payer: Self-pay

## 2020-03-30 ENCOUNTER — Ambulatory Visit
Admission: RE | Admit: 2020-03-30 | Discharge: 2020-03-30 | Disposition: A | Discharge status: Home / Self Care | Payer: Medicare Other | Source: Ambulatory Visit | Attending: Radiation Oncology | Admitting: Radiation Oncology

## 2020-03-30 DIAGNOSIS — C3492 Malignant neoplasm of unspecified part of left bronchus or lung: Secondary | ICD-10-CM

## 2020-03-30 DIAGNOSIS — C3412 Malignant neoplasm of upper lobe, left bronchus or lung: Secondary | ICD-10-CM

## 2020-03-30 DIAGNOSIS — C3411 Malignant neoplasm of upper lobe, right bronchus or lung: Secondary | ICD-10-CM | POA: Diagnosis not present

## 2020-03-30 DIAGNOSIS — G8929 Other chronic pain: Secondary | ICD-10-CM

## 2020-03-30 DIAGNOSIS — C3491 Malignant neoplasm of unspecified part of right bronchus or lung: Secondary | ICD-10-CM

## 2020-03-30 DIAGNOSIS — Z1329 Encounter for screening for other suspected endocrine disorder: Secondary | ICD-10-CM

## 2020-03-30 DIAGNOSIS — Z08 Encounter for follow-up examination after completed treatment for malignant neoplasm: Secondary | ICD-10-CM | POA: Diagnosis not present

## 2020-03-30 DIAGNOSIS — C320 Malignant neoplasm of glottis: Secondary | ICD-10-CM | POA: Diagnosis not present

## 2020-03-31 ENCOUNTER — Encounter: Payer: Self-pay | Admitting: Radiation Oncology

## 2020-03-31 NOTE — Progress Notes (Signed)
Radiation Oncology         [phone] ________________________________  Name: Logan Price MRN: [id number]  Date: 03/30/2020  DOB: 1946/01/05  Follow-Up Visit Note by telephone.  The patient opted for telemedicine to maximize safety during the pandemic.  MyChart video was not obtainable.   CC: Logan Spencer, FNP  Newman Pies, MD  Diagnosis and Prior Radiotherapy:       ICD-10-CM   1. Adenocarcinoma, lung, left (HCC)  C34.92   2. 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HCC)  C34.92   3. Primary cancer of left upper lobe of lung (HCC)  C34.12   4. 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 Squamous cell carcinoma of glottis (HCC)  C32.0   6. Squamous cell lung cancer, right (HCC)  C34.91     Radiation Treatment Dates: 10/22/2018 through 12/10/2018 Site Technique Total Dose Dose per Fx Completed Fx Beam Energies  Head & neck: HN_larynx IMRT 70/70 2 35/35 6X   Radiation Treatment Dates: 06/03/2019 through 06/13/2019 Site Technique Total Dose (Gy) Dose per Fx (Gy) Completed Fx Beam Energies  Lung, Right: Lung_Rt_Upper IMRT 60/60 12 5/5 6XFFF  Lung, Left: Lung_Lt_Upper IMRT 60/60 12 5/5 6XFFF    CHIEF COMPLAINT:  Here for follow-up and surveillance of throat and lung cancer   Narrative:    He is feeling well, no new complaints whatsoever. No new respiratory complaints, masses in neck, or throat symptoms.   I have personally review his images (CT chest). He presents for his results. He continues following with ENT.  ALLERGIES:  has No Known Allergies.  Meds: Current Outpatient Medications  Medication Sig Dispense Refill  . diclofenac (VOLTAREN) 75 MG EC tablet TAKE 1 TABLET BY MOUTH TWICE A DAY 60 tablet 2  . doxazosin (CARDURA) 1 MG tablet Place 1 tablet (1 mg total) into feeding tube daily. (Patient taking differently: Take 1 mg by mouth daily.) 30 tablet 5  . pantoprazole (PROTONIX) 40 MG tablet TAKE ONE TABLET DAILY 30 MINUTES PRIOR TO THE FIRST MEAL 90 tablet 2   . simvastatin (ZOCOR) 20 MG tablet TAKE 1 TABLET DAILY AT 6PM 90 tablet 1   No current facility-administered medications for this encounter.    Physical Findings: The patient is in no acute distress. Patient is alert and oriented. Wt Readings from Last 3 Encounters:  03/10/20 163 lb (73.9 kg)  12/24/19 162 lb 3.2 oz (73.6 kg)  10/07/19 152 lb (68.9 kg)    vitals were not taken for this visit. .  General: Alert and oriented, in no acute distress.    Lab Findings: Lab Results  Component Value Date   WBC 10.1 04/30/2019   HGB 11.8 (L) 04/30/2019   HCT 37.7 (L) 04/30/2019   MCV 90.4 04/30/2019   PLT 212 04/30/2019    Lab Results  Component Value Date   TSH 4.026 12/24/2019    Radiographic Findings: CT Chest Wo Contrast  Result Date: 03/25/2020 CLINICAL DATA:  Follow-up non-small cell lung cancer post radiation therapy. EXAM: CT CHEST WITHOUT CONTRAST TECHNIQUE: Multidetector CT imaging of the chest was performed following the standard protocol without IV contrast. COMPARISON:  Chest CT 12/22/2019 and 09/18/2019. FINDINGS: Cardiovascular: Extensive atherosclerosis of the aorta, great vessels and coronary arteries again noted. The heart size is normal. There is no pericardial effusion. Mediastinum/Nodes: There are no enlarged mediastinal, hilar or axillary lymph nodes.Hilar assessment is limited by the lack of intravenous contrast, although the hilar contours appear  unchanged. The thyroid gland, trachea and esophagus demonstrate no significant findings. Lungs/Pleura: No pleural effusion or pneumothorax. Moderate centrilobular and paraseptal emphysema again noted. There are stable paramediastinal fibrotic changes anteromedially in both lung apices attributed to prior radiation therapy. Nodular thickening along the anterior aspect of the minor fissure is stable, measuring 1.4 x 1.3 cm on image 99/5. The spiculated nodule in the lingula adjacent to fiducial markers is unchanged, measuring  11 x 9 mm on image 89/5. Surrounding radiation changes are stable. No new or enlarging pulmonary nodules identified. Upper abdomen:  Unremarkable.  There is no adrenal mass. Musculoskeletal/Chest wall: There is no chest wall mass or suspicious osseous finding. Stable moderate bilateral gynecomastia and sequela of prior tracheostomy. IMPRESSION: 1. Stable appearance of treated nodules in the lingula and anteriorly along the minor fissure. 2. No evidence of local recurrence or metastatic disease. No acute findings. 3. Aortic Atherosclerosis (ICD10-I70.0) and Emphysema (ICD10-J43.9). Electronically Signed   By: Carey Bullocks M.D.   On: 03/25/2020 16:20   MR Lumbar Spine w/o contrast  Result Date: 03/24/2020 CLINICAL DATA:  Low back pain, > 6 wks EXAM: MRI LUMBAR SPINE WITHOUT CONTRAST TECHNIQUE: Multiplanar, multisequence MR imaging of the lumbar spine was performed. No intravenous contrast was administered. COMPARISON:  10/07/2019. FINDINGS: Segmentation:  Standard. Alignment:  Normal. Vertebrae: Normal bone marrow signal intensity. No aggressive osseous lesions. Conus medullaris and cauda equina: Conus extends to the L2 level. Conus and cauda equina appear normal. Disc levels: Multilevel desiccation. L1-2: Mild disc bulge and facet degenerative spurring. Patent spinal canal and neural foramen. L2-3: Mild disc bulge with shallow left extraforaminal protrusion. Bilateral facet degenerative spurring. Patent spinal canal and neural foramen. L3-4: Mild disc bulge and bilateral facet degenerative spurring. Grazing of the exiting left L3 nerve root. Patent spinal canal. Mild bilateral neural foraminal narrowing. L4-5: Disc bulge and bilateral facet hypertrophy. Ligamentum flavum thickening. Far left lateral annular fissuring. Mild spinal canal, mild left and moderate right neural foraminal narrowing. L5-S1: Disc bulge grazing the exiting L5 and descending S1 nerve roots. Prominent ligamentum flavum and bilateral  facet hypertrophy. Patent spinal canal. Mild bilateral neural foraminal narrowing. Paraspinal and other soft tissues: Right renal cysts. IMPRESSION: Multilevel spondylosis. Mild spinal canal, moderate right and mild left neural foraminal narrowing at the L4-5 level. Mild bilateral L3-4 and bilateral L5-S1 neural foraminal narrowing. Electronically Signed   By: Stana Bunting M.D.   On: 03/24/2020 16:44    Impression/Plan:    1) Head and Neck Cancer Status: No evidence of progression or recurrence in chest per recent CT scan.    2) Nutritional Status: no new issues   3) Risk Factors: The patient has been educated about risk factors including alcohol and tobacco abuse; they understand that avoidance of alcohol and tobacco is important to prevent recurrences as well as other cancers  4) Swallowing: continue SLP exercises, swallowing has improved since trach removal  5)  Thyroid function: will check annually  Lab Results  Component Value Date   TSH 4.026 12/24/2019   6)  Follow-up in April with repeat chest CT. Continue close follow-up with otolaryngology.   7) We discussed measures to reduce the risk of infection during the COVID-19 pandemic. He received his booster after his previous visit in September.  This encounter was provided by telemedicine platform; patient desired telemedicine during pandemic precautions.  MyChart Video was not available, telephone  was used. The patient has given verbal consent for this type of encounter and has been advised  to only accept a meeting of this type in a secure network environment. On date of service, in total, I spent 15 minutes on this encounter.   The attendants for this meeting include Lonie Peak  and Mosie Epstein During the encounter, Lonie Peak was located at [facility] Radiation Oncology Department.  Mosie Epstein was located at home.   _____________________________________   Lonie Peak, MD

## 2020-04-06 ENCOUNTER — Other Ambulatory Visit: Payer: Self-pay | Admitting: Family

## 2020-04-06 DIAGNOSIS — E785 Hyperlipidemia, unspecified: Secondary | ICD-10-CM

## 2020-04-07 ENCOUNTER — Other Ambulatory Visit: Payer: Self-pay

## 2020-04-07 ENCOUNTER — Ambulatory Visit: Payer: Medicare Other | Admitting: Orthopaedic Surgery

## 2020-04-07 ENCOUNTER — Ambulatory Visit: Payer: Medicare Other | Admitting: Licensed Clinical Social Worker

## 2020-04-07 DIAGNOSIS — K219 Gastro-esophageal reflux disease without esophagitis: Secondary | ICD-10-CM

## 2020-04-07 DIAGNOSIS — I10 Essential (primary) hypertension: Secondary | ICD-10-CM

## 2020-04-07 DIAGNOSIS — E559 Vitamin D deficiency, unspecified: Secondary | ICD-10-CM

## 2020-04-07 DIAGNOSIS — E785 Hyperlipidemia, unspecified: Secondary | ICD-10-CM

## 2020-04-07 NOTE — Patient Instructions (Addendum)
Licensed Clinical Social Worker Visit Information  Goals we discussed today:   .  Client will talk with LCSW in next 30 days to discuss ADLs completion for client (pt-stated)        CARE PLAN ENTRY   Current Barriers:   Atrial Fibrillation in client with Chronic Diagnoses of Adenocarcinoma of left lung, Vitamin D deficiency, GERD, HTN, HLD  Clinical Social Work Clinical Goal(s):   LCSW will call client in next 30 days to discuss client completion of ADLs  Interventions:    Talked with client about pain issues in his lower back  Talked with client about mobility issues (uses a cane to help him walk)  Talked with client about transport needs of client  Talked with client about social support network (has support from his spouse and from his brother)  Talked with client about ADLs completion of client  Talked with client about upcoming medical appointments   Talked with client about physical therapy sessions received by client in the past  Talked with [facility] about his skin condition at present  Encouraged Joshva to talk with [facility] as needed regarding nursing needs of client  Talked with [facility] about medication procurement  Talked with [facility] about vision of client  Talked with Trilby Leaver about appetite of client  Talked with [facility] about breathing of client   Patient Self Care Activities:   Attends scheduled medical appointments Drives to medical appointments and drives to complete errands needed  Patient Self Care Deficits:  Mobility issues Pain issues   Initial goal documentation      Follow Up Plan:LCSW to call client in next 4 weeks to talk with client about client completion of daily ADLs  Materials Provided: No  The patient verbalized understanding of instructions provided today and declined a print copy of patient instruction materials.   Norva Riffle.Cloys Vera MSW, LCSW Licensed Clinical Social Worker [facility] Care Management [phone]

## 2020-04-07 NOTE — Chronic Care Management (AMB) (Signed)
Chronic Care Management    Clinical Social Work Follow Up Note  04/07/2020 Name: Logan Price MRN: [id number] DOB: 1945-06-06  Logan Price is a 75 y.o. year old male who is a primary care patient of [name], FNP. The CCM team was consulted for assistance with [name] .   Review of patient status, including review of consultants reports, other relevant assessments, and collaboration with appropriate care team members and the patient's provider was performed as part of comprehensive patient evaluation and provision of chronic care management services.    SDOH (Social Determinants of Health) assessments performed: No; risk for depression; risk for tobacco use; risk for stress; risk for physical inactivity  Flowsheet Row Chronic Care Management from 09/08/2019 in [facility]  PHQ-9 Total Score 2      GAD 7 : Generalized Anxiety Score 09/08/2019  Nervous, Anxious, on Edge 0  Control/stop worrying 0  Worry too much - different things 0  Trouble relaxing 0  Restless 0  Easily annoyed or irritable 0  Afraid - awful might happen 0  Total GAD 7 Score 0  Anxiety Difficulty Somewhat difficult    Outpatient Encounter Medications as of 04/07/2020  Medication Sig  . diclofenac (VOLTAREN) 75 MG EC tablet TAKE 1 TABLET BY MOUTH TWICE A DAY  . doxazosin (CARDURA) 1 MG tablet Place 1 tablet (1 mg total) into feeding tube daily. (Patient taking differently: Take 1 mg by mouth daily.)  . pantoprazole (PROTONIX) 40 MG tablet TAKE ONE TABLET DAILY 30 MINUTES PRIOR TO THE FIRST MEAL  . simvastatin (ZOCOR) 20 MG tablet TAKE 1 TABLET DAILY AT 6PM   No facility-administered encounter medications on file as of 04/07/2020.    Goals    .  Client will talk with LCSW in next 30 days to discuss ADLs completion for client (pt-stated)      CARE PLAN ENTRY   Current Barriers:  . Atrial Fibrillation in client with Chronic Diagnoses of Adenocarcinoma of left lung,  Vitamin D deficiency, GERD, HTN, HLD  Clinical Social Work Clinical Goal(s):  Marland Kitchen LCSW will call client in next 30 days to discuss client completion of ADLs  Interventions:   . Talked with client about pain issues in his lower back . Talked with client about mobility issues (uses a cane to help him walk) . Talked with client about transport needs of client . Talked with client about social support network (has support from his spouse and from his brother) . Talked with client about ADLs completion of client . Talked with client about upcoming medical appointments  . Talked with client about physical therapy sessions received by client in the past . Talked with [facility] about his skin condition at present . Encouraged Gregary to talk with RNCM as needed regarding nursing needs of client . Talked with [facility] about medication procurement . Talked with [facility] about vision of client . Talked with Sahib about appetite of client . Talked with Joevon about breathing of client   Patient Self Care Activities:   Attends scheduled medical appointments Drives to medical appointments and drives to complete errands needed  Patient Self Care Deficits:  Mobility issues Pain issues   Initial goal documentation      Follow Up Plan: LCSW to call client in next 4 weeks to talk with client about client completion of daily ADLs  Kelton Pillar.Kessie Croston MSW, LCSW Licensed Clinical Social Worker [facility] Care Management [phone]

## 2020-04-08 ENCOUNTER — Encounter: Payer: Self-pay | Admitting: Family

## 2020-04-08 ENCOUNTER — Ambulatory Visit (INDEPENDENT_AMBULATORY_CARE_PROVIDER_SITE_OTHER): Payer: Medicare Other | Admitting: Family

## 2020-04-08 ENCOUNTER — Other Ambulatory Visit: Payer: Self-pay

## 2020-04-08 VITALS — BP 148/83 | HR 83 | Temp 97.0°F | Ht 69.0 in | Wt 164.6 lb

## 2020-04-08 DIAGNOSIS — N138 Other obstructive and reflux uropathy: Secondary | ICD-10-CM

## 2020-04-08 DIAGNOSIS — M5441 Lumbago with sciatica, right side: Secondary | ICD-10-CM

## 2020-04-08 DIAGNOSIS — I10 Essential (primary) hypertension: Secondary | ICD-10-CM

## 2020-04-08 DIAGNOSIS — E559 Vitamin D deficiency, unspecified: Secondary | ICD-10-CM | POA: Diagnosis not present

## 2020-04-08 DIAGNOSIS — E785 Hyperlipidemia, unspecified: Secondary | ICD-10-CM

## 2020-04-08 DIAGNOSIS — C760 Malignant neoplasm of head, face and neck: Secondary | ICD-10-CM

## 2020-04-08 DIAGNOSIS — M5442 Lumbago with sciatica, left side: Secondary | ICD-10-CM | POA: Diagnosis not present

## 2020-04-08 DIAGNOSIS — C329 Malignant neoplasm of larynx, unspecified: Secondary | ICD-10-CM

## 2020-04-08 DIAGNOSIS — K219 Gastro-esophageal reflux disease without esophagitis: Secondary | ICD-10-CM | POA: Diagnosis not present

## 2020-04-08 DIAGNOSIS — Z923 Personal history of irradiation: Secondary | ICD-10-CM | POA: Diagnosis not present

## 2020-04-08 DIAGNOSIS — C320 Malignant neoplasm of glottis: Secondary | ICD-10-CM | POA: Diagnosis not present

## 2020-04-08 DIAGNOSIS — N401 Enlarged prostate with lower urinary tract symptoms: Secondary | ICD-10-CM

## 2020-04-08 DIAGNOSIS — G8929 Other chronic pain: Secondary | ICD-10-CM | POA: Diagnosis not present

## 2020-04-08 NOTE — Progress Notes (Signed)
Subjective:    Patient ID: Logan Price, male    DOB: [birth year], 75 y.o.   MRN: [id number]  Chief Complaint  Patient presents with  . Hypertension    6 mth , fasting no cocerns    Pt presents to the office today for chronic follow up. 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  He is followed by ENT and had his trach reversal in March 2021.  He is currently on a regular diet.    Pt has seen Ortho for chronic back pain and is currently getting steroid injections and completed PT. He reports his pain constantly a 8 out 10.  Hypertension This is a chronic problem. The current episode started more than 1 year ago. The problem has been resolved since onset. The problem is controlled. Pertinent negatives include no malaise/fatigue, peripheral edema or shortness of breath. Risk factors for coronary artery disease include dyslipidemia, male gender and sedentary lifestyle. The current treatment provides moderate improvement. There is no history of CVA or heart failure.  Hyperlipidemia This is a chronic problem. The current episode started more than 1 year ago. Associated symptoms include leg pain. Pertinent negatives include no shortness of breath. Current antihyperlipidemic treatment includes statins. The current treatment provides moderate improvement of lipids. Risk factors for coronary artery disease include dyslipidemia, hypertension and a sedentary lifestyle.  Back Pain This is a chronic problem. The current episode started more than 1 year ago. The problem occurs constantly. The problem has been rapidly improving since onset. The pain is present in the lumbar spine. The quality of the pain is described as aching. The pain is at a severity of 8/10. The symptoms are aggravated by standing and sitting. Associated symptoms include leg pain, tingling and weakness. He has tried bed rest and NSAIDs for the symptoms. The treatment provided mild  relief.  Gastroesophageal Reflux He complains of belching, dysphagia and heartburn. This is a chronic problem. The current episode started more than 1 year ago. The problem occurs occasionally. The problem has been waxing and waning. He has tried a PPI for the symptoms. The treatment provided moderate relief.  Benign Prostatic Hypertrophy This is a chronic problem. The current episode started more than 1 year ago. Irritative symptoms include nocturia.      Review of Systems  Constitutional: Negative for malaise/fatigue.  Respiratory: Negative for shortness of breath.   Gastrointestinal: Positive for dysphagia and heartburn.  Genitourinary: Positive for nocturia.  Musculoskeletal: Positive for back pain.  Neurological: Positive for tingling and weakness.  All other systems reviewed and are negative.      Objective:   Physical Exam Vitals reviewed.  Constitutional:      General: He is not in acute distress.    Appearance: He is well-developed and well-nourished.  HENT:     Head: Normocephalic.     Right Ear: Tympanic membrane normal.     Left Ear: Tympanic membrane normal.     Mouth/Throat:     Mouth: Oropharynx is clear and moist.  Eyes:     General:        Right eye: No discharge.        Left eye: No discharge.     Pupils: Pupils are equal, round, and reactive to light.  Neck:     Thyroid: No thyromegaly.  Cardiovascular:     Rate and Rhythm: Normal rate and regular rhythm.     Pulses: Intact distal pulses.  Heart sounds: Normal heart sounds. No murmur heard.   Pulmonary:     Effort: Pulmonary effort is normal. No respiratory distress.     Breath sounds: Normal breath sounds. No wheezing.  Abdominal:     General: Bowel sounds are normal. There is no distension.     Palpations: Abdomen is soft.     Tenderness: There is no abdominal tenderness.  Musculoskeletal:        General: No tenderness. Normal range of motion.     Cervical back: Normal range of motion and  neck supple.     Right lower leg: Edema (trace) present.     Left lower leg: Edema (trace) present.  Skin:    General: Skin is warm and dry.     Findings: No erythema or rash.  Neurological:     Mental Status: He is alert and oriented to person, place, and time.     Cranial Nerves: No cranial nerve deficit.     Deep Tendon Reflexes: Reflexes are normal and symmetric.  Psychiatric:        Mood and Affect: Mood and affect normal.        Behavior: Behavior normal.        Thought Content: Thought content normal.        Judgment: Judgment normal.       BP (!) 148/83   Pulse 83   Temp (!) 97 F (36.1 C) (Temporal)   Ht 5\' 9"  (1.753 m)   Wt 164 lb 9.6 oz (74.7 kg)   SpO2 98%   BMI 24.31 kg/m      Assessment & Plan:  Logan Price comes in today with chief complaint of Hypertension (6 mth , fasting no cocerns)   Diagnosis and orders addressed:  1. Primary hypertension - CMP14+EGFR - CBC with Differential/Platelet  2. Squamous cell carcinoma of glottis (HCC) - CMP14+EGFR - CBC with Differential/Platelet  3. 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HCC) - CMP14+EGFR - CBC with Differential/Platelet  4. Gastroesophageal reflux disease, unspecified whether esophagitis present - CMP14+EGFR - CBC with Differential/Platelet  5. BPH with urinary obstruction - CMP14+EGFR - CBC with Differential/Platelet  6. Hx of radiation therapy - CMP14+EGFR - CBC with Differential/Platelet  7. Chronic bilateral low back pain with bilateral sciatica - CMP14+EGFR - CBC with Differential/Platelet  8. Vitamin D deficiency - CMP14+EGFR - CBC with Differential/Platelet  9. Hyperlipidemia, unspecified hyperlipidemia type - CMP14+EGFR - CBC with Differential/Platelet  10. Head and neck cancer (HCC) - CMP14+EGFR - CBC with Differential/Platelet   Labs pending Health Maintenance reviewed Diet and exercise encouraged  Follow up plan: 6 months    Logan Rodney, FNP

## 2020-04-08 NOTE — Patient Instructions (Signed)
Health Maintenance After Age 74 After age 74, you are at a higher risk for certain long-term diseases and infections as well as injuries from falls. Falls are a major cause of broken bones and head injuries in people who are older than age 74. Getting regular preventive care can help to keep you healthy and well. Preventive care includes getting regular testing and making lifestyle changes as recommended by your health care provider. Talk with your health care provider about:  Which screenings and tests you should have. A screening is a test that checks for a disease when you have no symptoms.  A diet and exercise plan that is right for you. What should I know about screenings and tests to prevent falls? Screening and testing are the best ways to find a health problem early. Early diagnosis and treatment give you the best chance of managing medical conditions that are common after age 74. Certain conditions and lifestyle choices may make you more likely to have a fall. Your health care provider may recommend:  Regular vision checks. Poor vision and conditions such as cataracts can make you more likely to have a fall. If you wear glasses, make sure to get your prescription updated if your vision changes.  Medicine review. Work with your health care provider to regularly review all of the medicines you are taking, including over-the-counter medicines. Ask your health care provider about any side effects that may make you more likely to have a fall. Tell your health care provider if any medicines that you take make you feel dizzy or sleepy.  Osteoporosis screening. Osteoporosis is a condition that causes the bones to get weaker. This can make the bones weak and cause them to break more easily.  Blood pressure screening. Blood pressure changes and medicines to control blood pressure can make you feel dizzy.  Strength and balance checks. Your health care provider may recommend certain tests to check your  strength and balance while standing, walking, or changing positions.  Foot health exam. Foot pain and numbness, as well as not wearing proper footwear, can make you more likely to have a fall.  Depression screening. You may be more likely to have a fall if you have a fear of falling, feel emotionally low, or feel unable to do activities that you used to do.  Alcohol use screening. Using too much alcohol can affect your balance and may make you more likely to have a fall. What actions can I take to lower my risk of falls? General instructions  Talk with your health care provider about your risks for falling. Tell your health care provider if: ? You fall. Be sure to tell your health care provider about all falls, even ones that seem minor. ? You feel dizzy, sleepy, or off-balance.  Take over-the-counter and prescription medicines only as told by your health care provider. These include any supplements.  Eat a healthy diet and maintain a healthy weight. A healthy diet includes low-fat dairy products, low-fat (lean) meats, and fiber from whole grains, beans, and lots of fruits and vegetables. Home safety  Remove any tripping hazards, such as rugs, cords, and clutter.  Install safety equipment such as grab bars in bathrooms and safety rails on stairs.  Keep rooms and walkways well-lit. Activity  Follow a regular exercise program to stay fit. This will help you maintain your balance. Ask your health care provider what types of exercise are appropriate for you.  If you need a cane or walker,   use it as recommended by your health care provider.  Wear supportive shoes that have nonskid soles.   Lifestyle  Do not drink alcohol if your health care provider tells you not to drink.  If you drink alcohol, limit how much you have: ? 0-1 drink a day for women. ? 0-2 drinks a day for men.  Be aware of how much alcohol is in your drink. In the [place] one drink equals one typical bottle of beer (12  oz), one-half glass of wine (5 oz), or one shot of hard liquor (1 oz).  Do not use any products that contain nicotine or tobacco, such as cigarettes and e-cigarettes. If you need help quitting, ask your health care provider. Summary  Having a healthy lifestyle and getting preventive care can help to protect your health and wellness after age 74.  Screening and testing are the best way to find a health problem early and help you avoid having a fall. Early diagnosis and treatment give you the best chance for managing medical conditions that are more common for people who are older than age 74.  Falls are a major cause of broken bones and head injuries in people who are older than age 74. Take precautions to prevent a fall at home.  Work with your health care provider to learn what changes you can make to improve your health and wellness and to prevent falls. This information is not intended to replace advice given to you by your health care provider. Make sure you discuss any questions you have with your health care provider. Document Revised: 07/04/2018 Document Reviewed: 01/24/2017 Elsevier Patient Education  2021 [employer] Inc.  

## 2020-04-09 LAB — CMP14+EGFR
ALT: 20 IU/L (ref 0–44)
AST: 19 IU/L (ref 0–40)
Albumin/Globulin Ratio: 2 (ref 1.2–2.2)
Albumin: 4.4 g/dL (ref 3.7–4.7)
Alkaline Phosphatase: 50 IU/L (ref 44–121)
BUN/Creatinine Ratio: 15 (ref 10–24)
BUN: 16 mg/dL (ref 8–27)
Bilirubin Total: 0.3 mg/dL (ref 0.0–1.2)
CO2: 26 mmol/L (ref 20–29)
Calcium: 9 mg/dL (ref 8.6–10.2)
Chloride: 105 mmol/L (ref 96–106)
Creatinine, Ser: 1.06 mg/dL (ref 0.76–1.27)
GFR calc Af Amer: 80 mL/min/{1.73_m2} (ref >59)
GFR calc non Af Amer: 69 mL/min/{1.73_m2} (ref >59)
Globulin, Total: 2.2 g/dL (ref 1.5–4.5)
Glucose: 93 mg/dL (ref 65–99)
Potassium: 4.5 mmol/L (ref 3.5–5.2)
Sodium: 140 mmol/L (ref 134–144)
Total Protein: 6.6 g/dL (ref 6.0–8.5)

## 2020-04-09 LAB — CBC WITH DIFFERENTIAL/PLATELET
Basophils Absolute: 0 10*3/uL (ref 0.0–0.2)
Basos: 1 %
EOS (ABSOLUTE): 0.2 10*3/uL (ref 0.0–0.4)
Eos: 5 %
Hematocrit: 40.1 % (ref 37.5–51.0)
Hemoglobin: 13.4 g/dL (ref 13.0–17.7)
Immature Grans (Abs): 0 10*3/uL (ref 0.0–0.1)
Immature Granulocytes: 1 %
Lymphocytes Absolute: 0.9 10*3/uL (ref 0.7–3.1)
Lymphs: 23 %
MCH: 31 pg (ref 26.6–33.0)
MCHC: 33.4 g/dL (ref 31.5–35.7)
MCV: 93 fL (ref 79–97)
Monocytes Absolute: 0.5 10*3/uL (ref 0.1–0.9)
Monocytes: 11 %
Neutrophils Absolute: 2.4 10*3/uL (ref 1.4–7.0)
Neutrophils: 59 %
Platelets: 183 10*3/uL (ref 150–450)
RBC: 4.32 x10E6/uL (ref 4.14–5.80)
RDW: 13.7 % (ref 11.6–15.4)
WBC: 4.1 10*3/uL (ref 3.4–10.8)

## 2020-04-22 ENCOUNTER — Encounter (HOSPITAL_COMMUNITY): Payer: Self-pay | Admitting: Physical Therapy

## 2020-04-22 NOTE — Therapy (Signed)
Logan Price, [name], [address] Phone: [phone]   Fax:  [phone]  Patient Details  Name: Logan Price MRN: [id number] Date of Birth: 1945-04-11 Referring Provider:  No ref. provider found  Encounter Date: 04/22/2020 Error discharge already completed   Rayetta Humphrey, PT CLT [id number] 04/22/2020, 4:53 PM  [facility] [address] [place], [name], [address] Phone: [phone]   Fax:  [phone]

## 2020-04-26 ENCOUNTER — Other Ambulatory Visit: Payer: Self-pay

## 2020-04-26 ENCOUNTER — Ambulatory Visit: Payer: Self-pay

## 2020-04-26 ENCOUNTER — Encounter: Payer: Self-pay | Admitting: Internal Medicine

## 2020-04-26 ENCOUNTER — Ambulatory Visit (INDEPENDENT_AMBULATORY_CARE_PROVIDER_SITE_OTHER): Payer: Medicare Other | Admitting: Physical Medicine and Rehabilitation

## 2020-04-26 ENCOUNTER — Encounter: Payer: Self-pay | Admitting: Physical Medicine and Rehabilitation

## 2020-04-26 VITALS — BP 170/90 | HR 87

## 2020-04-26 DIAGNOSIS — M5416 Radiculopathy, lumbar region: Secondary | ICD-10-CM

## 2020-04-26 MED ORDER — METHYLPREDNISOLONE ACETATE 80 MG/ML IJ SUSP
80.0000 mg | Freq: Once | INTRAMUSCULAR | Status: AC
  Administered 2020-04-26: 80 mg

## 2020-04-26 NOTE — Patient Instructions (Signed)

## 2020-04-26 NOTE — Progress Notes (Signed)
Pt state lower back pain. Pt state walking and standing makes the pain worse. Pt state he takes over the counter pain meds and icy/hot strays.  Numeric Pain Rating Scale and Functional Assessment Average Pain 8   In the last MONTH (on 0-10 scale) has pain interfered with the following?  1. General activity like being  able to carry out your everyday physical activities such as walking, climbing stairs, carrying groceries, or moving a chair?  Rating(8)   +Driver, -BT, -Dye Allergies.

## 2020-05-07 ENCOUNTER — Other Ambulatory Visit: Payer: Self-pay | Admitting: Family

## 2020-05-07 DIAGNOSIS — M5442 Lumbago with sciatica, left side: Secondary | ICD-10-CM

## 2020-05-07 DIAGNOSIS — G8929 Other chronic pain: Secondary | ICD-10-CM

## 2020-05-11 ENCOUNTER — Telehealth: Payer: Medicare Other

## 2020-05-14 ENCOUNTER — Ambulatory Visit (INDEPENDENT_AMBULATORY_CARE_PROVIDER_SITE_OTHER): Payer: Medicare Other | Admitting: *Deleted

## 2020-05-14 DIAGNOSIS — Z0000 Encounter for general adult medical examination without abnormal findings: Secondary | ICD-10-CM | POA: Diagnosis not present

## 2020-05-14 NOTE — Patient Instructions (Signed)
Preventive Care 74 Years and Older, Male Preventive care refers to lifestyle choices and visits with your health care provider that can promote health and wellness. This includes:  A yearly physical exam. This is also called an annual wellness visit.  Regular dental and eye exams.  Immunizations.  Screening for certain conditions.  Healthy lifestyle choices, such as: ? Eating a healthy diet. ? Getting regular exercise. ? Not using drugs or products that contain nicotine and tobacco. ? Limiting alcohol use. What can I expect for my preventive care visit? Physical exam Your health care provider will check your:  Height and weight. These may be used to calculate your BMI (body mass index). BMI is a measurement that tells if you are at a healthy weight.  Heart rate and blood pressure.  Body temperature.  Skin for abnormal spots. Counseling Your health care provider may ask you questions about your:  Past medical problems.  Family's medical history.  Alcohol, tobacco, and drug use.  Emotional well-being.  Home life and relationship well-being.  Sexual activity.  Diet, exercise, and sleep habits.  History of falls.  Memory and ability to understand (cognition).  Work and work environment.  Access to firearms. What immunizations do I need? Vaccines are usually given at various ages, according to a schedule. Your health care provider will recommend vaccines for you based on your age, medical history, and lifestyle or other factors, such as travel or where you work.   What tests do I need? Blood tests  Lipid and cholesterol levels. These may be checked every 5 years, or more often depending on your overall health.  Hepatitis C test.  Hepatitis B test. Screening  Lung cancer screening. You may have this screening every year starting at age 55 if you have a 30-pack-year history of smoking and currently smoke or have quit within the past 15 years.  Colorectal  cancer screening. ? All adults should have this screening starting at age 50 and continuing until age 75. ? Your health care provider may recommend screening at age 45 if you are at increased risk. ? You will have tests every 1-10 years, depending on your results and the type of screening test.  Prostate cancer screening. Recommendations will vary depending on your family history and other risks.  Genital exam to check for testicular cancer or hernias.  Diabetes screening. ? This is done by checking your blood sugar (glucose) after you have not eaten for a while (fasting). ? You may have this done every 1-3 years.  Abdominal aortic aneurysm (AAA) screening. You may need this if you are a current or former smoker.  STD (sexually transmitted disease) testing, if you are at risk. Follow these instructions at home: Eating and drinking  Eat a diet that includes fresh fruits and vegetables, whole grains, lean protein, and low-fat dairy products. Limit your intake of foods with high amounts of sugar, saturated fats, and salt.  Take vitamin and mineral supplements as recommended by your health care provider.  Do not drink alcohol if your health care provider tells you not to drink.  If you drink alcohol: ? Limit how much you have to 0-2 drinks a day. ? Be aware of how much alcohol is in your drink. In the [place] one drink equals one 12 oz bottle of beer (355 mL), one 5 oz glass of wine (148 mL), or one 1 oz glass of hard liquor (44 mL).   Lifestyle  Take daily care of your teeth   and gums. Brush your teeth every morning and night with fluoride toothpaste. Floss one time each day.  Stay active. Exercise for at least 30 minutes 5 or more days each week.  Do not use any products that contain nicotine or tobacco, such as cigarettes, e-cigarettes, and chewing tobacco. If you need help quitting, ask your health care provider.  Do not use drugs.  If you are sexually active, practice safe sex.  Use a condom or other form of protection to prevent STIs (sexually transmitted infections).  Talk with your health care provider about taking a low-dose aspirin or statin.  Find healthy ways to cope with stress, such as: ? Meditation, yoga, or listening to music. ? Journaling. ? Talking to a trusted person. ? Spending time with friends and family. Safety  Always wear your seat belt while driving or riding in a vehicle.  Do not drive: ? If you have been drinking alcohol. Do not ride with someone who has been drinking. ? When you are tired or distracted. ? While texting.  Wear a helmet and other protective equipment during sports activities.  If you have firearms in your house, make sure you follow all gun safety procedures. What's next?  Visit your health care provider once a year for an annual wellness visit.  Ask your health care provider how often you should have your eyes and teeth checked.  Stay up to date on all vaccines. This information is not intended to replace advice given to you by your health care provider. Make sure you discuss any questions you have with your health care provider. Document Revised: 12/10/2018 Document Reviewed: 03/07/2018 Elsevier Patient Education  2021 [employer]  

## 2020-05-14 NOTE — Progress Notes (Signed)
MEDICARE ANNUAL WELLNESS VISIT  05/14/2020  Telephone Visit Disclaimer This Medicare AWV was conducted by telephone due to national recommendations for restrictions regarding the COVID-19 Pandemic (e.g. social distancing).  I verified, using two identifiers, that I am speaking with Logan Price or their authorized healthcare agent. I discussed the limitations, risks, security, and privacy concerns of performing an evaluation and management service by telephone and the potential availability of an in-person appointment in the future. The patient expressed understanding and agreed to proceed.  Location of Patient: home Location of Provider (nurse):  office  Subjective:    Logan Price is a 75 y.o. male patient of Hawks, Theador Hawthorne, FNP who had a Medicare Annual Wellness Visit today via telephone. [name] is Retired and lives with their spouse. he has 9 children. he reports that he is socially active and does interact with friends/family regularly. he is minimally physically active and enjoys watching TV.  Patient Care Team: Sharion Balloon, FNP as PCP - General (Nurse Practitioner) Danie Binder, MD (Inactive) as Consulting Physician (Gastroenterology) Eppie Gibson, MD as Attending Physician (Radiation Oncology) Leota Sauers, RN (Inactive) as Oncology Nurse Navigator Leta Baptist, MD as Consulting Physician (Otolaryngology) Shea Evans Norva Riffle, LCSW as [facility] (Licensed Clinical Social Worker) Eloise Harman, DO as Consulting Physician (Internal Medicine)  Advanced Directives 05/14/2020 11/10/2019 09/19/2019 07/30/2019 07/27/2019 07/18/2019 05/28/2019  Does Patient Have a Medical Advance Directive? No No No No No No No  Type of Advance Directive - - - - - - -  Would patient like information on creating a medical advance directive? No - Patient declined No - Patient declined No - Patient declined No - Patient declined - No - Patient declined -    Hospital  Utilization Over the Past 12 Months: # of hospitalizations or ER visits: 0 # of surgeries: 0  Review of Systems    Patient reports that his overall health is unchanged compared to last year.  History obtained from chart review and the patient  Patient Reported Readings (BP, Pulse, CBG, Weight, etc) none  Pain Assessment Pain : 0-10 Pain Score: 7  Pain Type: Chronic pain Pain Location: Back Pain Onset: More than a month ago Pain Frequency: Constant Pain Relieving Factors: nothing helps with the pain Effect of Pain on Daily Activities: mild  Pain Relieving Factors: nothing helps with the pain  Current Medications & Allergies (verified) Allergies as of 05/14/2020   No Known Allergies     Medication List       Accurate as of May 14, 2020  2:50 PM. If you have any questions, ask your nurse or doctor.        diclofenac 75 MG EC tablet Commonly known as: VOLTAREN TAKE 1 TABLET BY MOUTH TWICE A DAY   doxazosin 1 MG tablet Commonly known as: CARDURA Place 1 tablet (1 mg total) into feeding tube daily. What changed: how to take this   pantoprazole 40 MG tablet Commonly known as: PROTONIX TAKE ONE TABLET DAILY 30 MINUTES PRIOR TO THE FIRST MEAL   simvastatin 20 MG tablet Commonly known as: ZOCOR TAKE 1 TABLET DAILY AT 6PM       History (reviewed): Past Medical History:  Diagnosis Date  . Diverticulitis   . Dysrhythmia 09/2018   episode of SVT while in hospital  . 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 GERD (gastroesophageal reflux disease)   . 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 HTN (hypertension)   . Hyperlipidemia    Past Surgical History:  Procedure Laterality Date  . BIOPSY  05/04/2015   Procedure: BIOPSY;  Surgeon: Danie Binder, MD;  [facility];  Service: Endoscopy;;  bx's of ileocecal valve   . BRONCHIAL BRUSHINGS  04/29/2019   Procedure: BRONCHIAL BRUSHINGS;  Surgeon: Garner Nash, DO;  Location: [place];   Service: Pulmonary;;  . BRONCHIAL NEEDLE ASPIRATION BIOPSY  04/29/2019   Procedure: BRONCHIAL NEEDLE ASPIRATION BIOPSIES;  Surgeon: Garner Nash, DO;  Location: [facility];  Service: Pulmonary;;  . BRONCHIAL WASHINGS  04/29/2019   Procedure: BRONCHIAL WASHINGS;  Surgeon: Garner Nash, DO;  [facility];  Service: Pulmonary;;  . COLONOSCOPY WITH PROPOFOL N/A 05/04/2015   Dr. Oneida Alar: normal appearing ileum with prominent IC valve with tubular adenomas, moderate diverticulosis in sigmoid colon, ascending colon, and retum. Moderate sized internal hemorrhoids. Surveillance in 5 years  . ELECTROMAGNETIC NAVIGATION BROCHOSCOPY  04/29/2019   Procedure: NAVIGATION BRONCHOSCOPY;  Surgeon: Garner Nash, DO;  Location: [facility];  Service: Pulmonary;;  . ESOPHAGOGASTRODUODENOSCOPY (EGD) WITH PROPOFOL N/A 12/19/2016   Procedure: ESOPHAGOGASTRODUODENOSCOPY (EGD) WITH PROPOFOL;  Surgeon: Danie Binder, MD;  [facility];  Service: Endoscopy;  Laterality: N/A;  11:30am  . FLEXIBLE SIGMOIDOSCOPY N/A 12/10/2015   hemorrhoid banding X 3   . HEMORRHOID BANDING N/A 12/10/2015   Procedure: HEMORRHOID BANDING;  Surgeon: Danie Binder, MD;  [facility];  Service: Endoscopy;  Laterality: N/A;  1:30 PM  . INSERTION OF SUPRAPUBIC CATHETER N/A 02/24/2019   Procedure: INSERTION OF SUPRAPUBIC CATHETER;  Surgeon: Kathie Rhodes, MD;  [facility];  Service: Urology;  Laterality: N/A;  . IR CM INJ ANY COLONIC TUBE W/FLUORO  10/30/2018  . IR GASTROSTOMY TUBE MOD SED  10/04/2018  . IR GASTROSTOMY TUBE REMOVAL  10/08/2019  . IR IMAGING GUIDED PORT INSERTION  10/04/2018  . IR REMOVAL TUN ACCESS W/ PORT W/O FL MOD SED  10/14/2018  . IR REPLACE G-TUBE SIMPLE WO FLUORO  11/03/2018  . IR REPLACE G-TUBE SIMPLE WO FLUORO  07/28/2019  . LAPAROSCOPIC INSERTION GASTROSTOMY TUBE Left 10/07/2018   Procedure: LAPAROSCOPIC  GASTROSTOMY TUBE;  Surgeon: Kinsinger, Arta Bruce, MD;  Location: [facility];  Service:  General;  Laterality: Left;  Marland Kitchen MICROLARYNGOSCOPY N/A 09/27/2018   Procedure: MICRO DIRECT LARYNGOSCOPY WITH BIOPSY;  Surgeon: Leta Baptist, MD;  Location: [facility];  Service: ENT;  Laterality: N/A;  . None to date     As of 04/14/15  . POLYPECTOMY  05/04/2015   Procedure: POLYPECTOMY;  Surgeon: Danie Binder, MD;  [facility];  Service: Endoscopy;;  descending colon polyp, ascending colon polyp  . SAVORY DILATION N/A 12/19/2016   Procedure: SAVORY DILATION;  Surgeon: Danie Binder, MD;  [facility];  Service: Endoscopy;  Laterality: N/A;  . TRACHEOSTOMY TUBE PLACEMENT N/A 09/27/2018   Procedure: AWAKE TRACHEOSTOMY;  Surgeon: Leta Baptist, MD;  Location: [place];  Service: ENT;  Laterality: N/A;  . TRANSURETHRAL RESECTION OF PROSTATE N/A 02/24/2019   Procedure: TRANSURETHRAL RESECTION OF THE PROSTATE (TURP);  Surgeon: Kathie Rhodes, MD;  [facility];  Service: Urology;  Laterality: N/A;  . VIDEO BRONCHOSCOPY WITH ENDOBRONCHIAL NAVIGATION N/A 04/29/2019   Procedure: VIDEO BRONCHOSCOPY;  Surgeon: Garner Nash, DO;  Location: [place];  Service: Pulmonary;  Laterality: N/A;   Family History  Problem Relation Age of Onset  . Cancer Mother   .  Hypertension Father   . Hypertension Sister   . Hypertension Brother   . Hypertension Sister   . Aneurysm Brother 5       brain  . Colon cancer Neg Hx   . Colon polyps Neg Hx    Social History   Socioeconomic History  . Marital status: Married    Spouse name: Alma  . Number of children: 9  . Years of education: GED  . Highest education level: GED or equivalent  Occupational History  . Occupation: Retired    Comment: [employer]  Tobacco Use  . Smoking status: Former Smoker    Packs/day: 1.00    Years: 40.00    Pack years: 40.00    Types: Cigarettes    Quit date: 10/09/2006    Years since quitting: 13.6  . Smokeless tobacco: Never Used  Vaping Use  . Vaping Use: Never used  Substance and Sexual  Activity  . Alcohol use: Not Currently    Alcohol/week: 0.0 standard drinks    Comment: he denies 01/06/19  . Drug use: No  . Sexual activity: Yes    Partners: Female    Birth control/protection: None  Other Topics Concern  . Not on file  Social History Narrative   Lives with wife      9 children      GED   Social Determinants of Health   Financial Resource Strain: Low Risk   . Difficulty of Paying Living Expenses: Not hard at all  Food Insecurity: No Food Insecurity  . Worried About Charity fundraiser in the Last Year: Never true  . Ran Out of Food in the Last Year: Never true  Transportation Needs: No Transportation Needs  . Lack of Transportation (Medical): No  . Lack of Transportation (Non-Medical): No  Physical Activity: Insufficiently Active  . Days of Exercise per Week: 7 days  . Minutes of Exercise per Session: 20 min  Stress: No Stress Concern Present  . Feeling of Stress : Only a little  Social Connections: Socially Integrated  . Frequency of Communication with Friends and Family: More than three times a week  . Frequency of Social Gatherings with Friends and Family: More than three times a week  . Attends Religious Services: More than 4 times per year  . Active Member of Clubs or Organizations: Yes  . Attends Archivist Meetings: More than 4 times per year  . Marital Status: Married    Activities of Daily Living In your present state of health, do you have any difficulty performing the following activities: 05/14/2020  Hearing? N  Vision? N  Difficulty concentrating or making decisions? N  Walking or climbing stairs? N  Dressing or bathing? N  Doing errands, shopping? N  Preparing Food and eating ? N  Using the Toilet? N  In the past six months, have you accidently leaked urine? Y  Comment rarely  Do you have problems with loss of bowel control? N  Managing your Medications? N  Managing your Finances? N  Housekeeping or managing your  Housekeeping? N  Some recent data might be hidden    Patient Education/ Literacy How often do you need to have someone help you when you read instructions, pamphlets, or other written materials from your doctor or pharmacy?: 1 - Never What is the last grade level you completed in school?: GED  Exercise Current Exercise Habits: Home exercise routine, Type of exercise: stretching, Time (Minutes): 20, Frequency (Times/Week): 7,  Weekly Exercise (Minutes/Week): 140, Intensity: Mild, Exercise limited by: cardiac condition(s);respiratory conditions(s)  Diet Patient reports consuming 3 meals a day and 1 snack(s) a day Patient reports that his primary diet is: Regular Patient reports that she does have regular access to food.   Depression Screen PHQ 2/9 Scores 05/14/2020 04/08/2020 10/07/2019 09/08/2019 05/12/2019 04/09/2019 11/14/2018  PHQ - 2 Score 0 0 0 0 0 0 0  PHQ- 9 Score - - - 2 - 0 -     Fall Risk Fall Risk  05/14/2020 04/08/2020 10/07/2019 05/12/2019 04/09/2019  Falls in the past year? 0 0 0 0 0     Objective:  Logan Price seemed alert and oriented and he participated appropriately during our telephone visit.  Blood Pressure Weight BMI  BP Readings from Last 3 Encounters:  04/26/20 (!) 170/90  04/08/20 (!) 148/83  12/24/19 (!) 173/95   Wt Readings from Last 3 Encounters:  04/08/20 164 lb 9.6 oz (74.7 kg)  03/10/20 163 lb (73.9 kg)  12/24/19 162 lb 3.2 oz (73.6 kg)   BMI Readings from Last 1 Encounters:  04/08/20 24.31 kg/m    *Unable to obtain current vital signs, weight, and BMI due to telephone visit type  Hearing/Vision  . Wayne did not seem to have difficulty with hearing/understanding during the telephone conversation . Reports that he has not had a formal eye exam by an eye care professional within the past year . Reports that he has not had a formal hearing evaluation within the past year *Unable to fully assess hearing and vision during telephone visit type  Cognitive  Function: 6CIT Screen 05/14/2020 05/12/2019  What Year? 0 points 0 points  What month? 0 points 0 points  What time? 0 points 0 points  Count back from 20 0 points 0 points  Months in reverse 0 points 0 points  Repeat phrase 2 points 0 points  Total Score 2 0   (Normal:0-7, Significant for Dysfunction: >8)  Normal Cognitive Function Screening: Yes   Immunization & Health Maintenance Record Immunization History  Administered Date(s) Administered  . Fluad Quad(high Dose 65+) 12/24/2019  . Hep A / Hep B 06/03/2015, 07/06/2015, 01/04/2016  . PFIZER(Purple Top)SARS-COV-2 Vaccination 05/17/2019, 06/10/2019, 12/24/2019  . Pneumococcal Conjugate-13 09/03/2013  . Pneumococcal Polysaccharide-23 10/09/2014  . Tdap 03/04/2010    Health Maintenance  Topic Date Due  . Samul Dada  03/04/2020  . COVID-19 Vaccine (4 - Booster for Pfizer series) 06/22/2020  . COLONOSCOPY (Pts 45-63yrs Insurance coverage will need to be confirmed)  05/03/2025  . Hepatitis C Screening  Completed  . PNA vac Low Risk Adult  Completed  . INFLUENZA VACCINE  Discontinued       Assessment  This is a routine wellness examination for Logan Price.  Health Maintenance: Due or Overdue Health Maintenance Due  Topic Date Due  . TETANUS/TDAP  03/04/2020    Logan Price does not need a referral for Community Assistance: Care Management:   no Social Work:    no Prescription Assistance:  no Nutrition/Diabetes Education:  no   Plan:  Personalized Goals Goals Addressed            This Visit's Progress   . DIET - INCREASE WATER INTAKE       Try to drink 6-8 glasses of water daily.      Personalized Health Maintenance & Screening Recommendations  Td vaccine Advanced directives: has NO advanced directive - not interested in additional information  Lung Cancer Screening  Recommended: no (Low Dose CT Chest recommended if Age 56-80 years, 30 pack-year currently smoking OR have quit w/in past 15  years) Hepatitis C Screening recommended: no HIV Screening recommended: no  Advanced Directives: Written information was not prepared per patient's request.  Referrals & Orders No orders of the defined types were placed in this encounter.   Follow-up Plan . Follow-up with Sharion Balloon, FNP as planned . Consider TDAP vaccine at your next visit with your PCP    I have personally reviewed and noted the following in the patient's chart:   . Medical and social history . Use of alcohol, tobacco or illicit drugs  . Current medications and supplements . Functional ability and status . Nutritional status . Physical activity . Advanced directives . List of other physicians . Hospitalizations, surgeries, and ER visits in previous 12 months . Vitals . Screenings to include cognitive, depression, and falls . Referrals and appointments  In addition, I have reviewed and discussed with Logan Price certain preventive protocols, quality metrics, and best practice recommendations. A written personalized care plan for preventive services as well as general preventive health recommendations is available and can be mailed to the patient at his request.      [name] Hock, LPN  12/08/4456

## 2020-05-19 ENCOUNTER — Other Ambulatory Visit: Payer: Self-pay

## 2020-05-19 ENCOUNTER — Encounter: Payer: Self-pay | Admitting: Orthopaedic Surgery

## 2020-05-19 ENCOUNTER — Ambulatory Visit (INDEPENDENT_AMBULATORY_CARE_PROVIDER_SITE_OTHER): Payer: Medicare Other | Admitting: Orthopaedic Surgery

## 2020-05-19 VITALS — Ht 69.0 in | Wt 164.0 lb

## 2020-05-19 DIAGNOSIS — I714 Abdominal aortic aneurysm, without rupture, unspecified: Secondary | ICD-10-CM

## 2020-05-19 DIAGNOSIS — G8929 Other chronic pain: Secondary | ICD-10-CM | POA: Diagnosis not present

## 2020-05-19 DIAGNOSIS — M5441 Lumbago with sciatica, right side: Secondary | ICD-10-CM

## 2020-05-19 DIAGNOSIS — M5442 Lumbago with sciatica, left side: Secondary | ICD-10-CM

## 2020-05-19 NOTE — Progress Notes (Signed)
Office Visit Note   Patient: Logan Price           Date of Birth: [birth year]           MRN: [id number] Visit Date: 05/19/2020              Requested by: [name], FNP [id number] [place],  [place] [address] PCP: [name], FNP   Assessment & Plan: Visit Diagnoses:  1. Abdominal aortic aneurysm (AAA) without rupture (HCC)   2. Chronic bilateral low back pain with bilateral sciatica     Plan: Mr. Gregori recently had an MRI scan of his lumbar spine demonstrating multilevel spondylosis.  There was mild spinal canal and moderate right and mild left neural foraminal narrowing at the L4-5 level mild bilateral L3-4 and bilateral L5-S1 neural foraminal narrowing.  He did see Dr. Alvester Morin who performed an epidural steroid injection on 04/26/2020.  Mr. Austerman relates that he is a little better but still has some back and leg pain.  He has been through a course of physical therapy.  I think it is worth having Dr. Alvester Morin reevaluate him.  I also want to obtain an ultrasound of his abdominal aorta as there is diffuse calcification a little widening at several levels.  This could be a cause of claudication.  He is not having any bowel or bladder changes as result of his back.  His pain radiates into his thighs but no further distally.  On review of his lumbar spine and limited views of his hips there was not any significant arthritis of either hip.  Plan to check him back in a month  Follow-Up Instructions: Return in about 1 month (around 06/16/2020).   Orders:  Orders Placed This Encounter  Procedures  . VAS [place] AAA DUPLEX   No orders of the defined types were placed in this encounter.     Procedures: No procedures performed   Clinical Data: No additional findings.   Subjective: Chief Complaint  Patient presents with  . Lower Back - Follow-up  Patient presents today for follow up on his lower back. He saw Dr.Newton on 04/26/2020 and had an ESI. He states that it did help a  little bit. He is not taking anything for pain.  Still having some back pain and bilateral thigh discomfort.  He seems to have more trouble the longer he stands of the further he walks.  This certainly could be consistent with claudication.  He has not had any recent changes in bowel or bladder function  HPI  Review of Systems   Objective: Vital Signs: Ht 5\' 9"  (1.753 m)   Wt 164 lb (74.4 kg)   BMI 24.22 kg/m   Physical Exam Constitutional:      Appearance: He is well-developed and well-nourished.  HENT:     Mouth/Throat:     Mouth: Oropharynx is clear and moist.  Eyes:     Extraocular Movements: EOM normal.     Pupils: Pupils are equal, round, and reactive to light.  Pulmonary:     Effort: Pulmonary effort is normal.  Skin:    General: Skin is warm and dry.  Neurological:     Mental Status: He is alert and oriented to person, place, and time.  Psychiatric:        Mood and Affect: Mood and affect normal.        Behavior: Behavior normal.     Ortho Exam awake alert and oriented x3.  Comfortable sitting.  Accompanied by family member.  Straight leg raise is negative.  Little rigidity with internal and external rotation of both of his hips but no significant groin pain.  No percussible tenderness of his lumbar spine or any discomfort over this sacrum or either SI joint.  No localized tenderness over either greater trochanters.  Were warm.  Had +1 pulses of both ankles  Specialty Comments:  No specialty comments available.  Imaging: No results found.   PMFS History: Patient Active Problem List   Diagnosis Date Noted  . Low back pain 03/10/2020  . Malignant neoplasm of right upper lobe of lung (HCC) 05/27/2019  . Primary cancer of left upper lobe of lung (HCC) 05/27/2019  . Pseudomonas aeruginosa infection 05/21/2019  . Tracheobronchitis 05/21/2019  . Hx of radiation therapy 05/21/2019  . 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HCC) 05/21/2019  . Squamous cell lung cancer, right  (HCC) 05/21/2019  . Adenocarcinoma, lung, left (HCC) 05/21/2019  . Lung nodule 04/23/2019  . Ground glass opacity present on imaging of lung 04/23/2019  . Head and neck cancer (HCC) 04/23/2019  . 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 Pleural effusion   . 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HCC)   . Bacteremia   . Leukocytosis   . Normocytic anemia   . PSVT (paroxysmal supraventricular tachycardia) (HCC) 10/03/2018  . Atrial fibrillation (HCC) 10/03/2018  . Goals of care, counseling/discussion   . Dysphagia   . Loss of weight   . 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 Gastritis determined by endoscopy   . Esophageal dysphagia   . 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 Globus sensation 12/12/2016  . Smokes with greater than 40 pack year history 10/20/2016  . Hemorrhoids, internal, with bleeding 10/06/2015  . History of colonic polyps 04/14/2015  . Vitamin D deficiency 10/09/2014  . HLD (hyperlipidemia) 10/08/2012  . GERD (gastroesophageal reflux disease) 10/08/2012  . HTN (hypertension)   . Diverticulitis 10/25/2009   Past Medical History:  Diagnosis Date  . Diverticulitis   . Dysrhythmia 09/2018   episode of SVT while in hospital  . 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 GERD (gastroesophageal reflux disease)   . 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 HTN (hypertension)   . Hyperlipidemia     Family History  Problem Relation Age of Onset  . Cancer Mother   . Hypertension Father   . Hypertension Sister   . Hypertension Brother   . Hypertension Sister   . Aneurysm Brother 32       brain  . Colon cancer Neg Hx   . Colon polyps Neg Hx     Past Surgical History:  Procedure Laterality Date  . BIOPSY  05/04/2015   Procedure: BIOPSY;  Surgeon: West Bali, MD;  [facility];  Service: Endoscopy;;  bx's of ileocecal valve   . BRONCHIAL BRUSHINGS  04/29/2019   Procedure: BRONCHIAL BRUSHINGS;  Surgeon: Josephine Igo,  DO;  [facility];  Service: Pulmonary;;  . BRONCHIAL NEEDLE ASPIRATION BIOPSY  04/29/2019   Procedure: BRONCHIAL NEEDLE ASPIRATION BIOPSIES;  Surgeon: Josephine Igo, DO;  [facility];  Service: Pulmonary;;  . BRONCHIAL WASHINGS  04/29/2019   Procedure: BRONCHIAL WASHINGS;  Surgeon: Josephine Igo, DO;  [facility];  Service: Pulmonary;;  . COLONOSCOPY WITH PROPOFOL N/A 05/04/2015   Dr. Darrick Penna: normal appearing ileum with prominent IC valve with tubular adenomas, moderate diverticulosis in sigmoid  colon, ascending colon, and retum. Moderate sized internal hemorrhoids. Surveillance in 5 years  . ELECTROMAGNETIC NAVIGATION BROCHOSCOPY  04/29/2019   Procedure: NAVIGATION BRONCHOSCOPY;  Surgeon: Josephine Igo, DO;  [facility];  Service: Pulmonary;;  . ESOPHAGOGASTRODUODENOSCOPY (EGD) WITH PROPOFOL N/A 12/19/2016   Procedure: ESOPHAGOGASTRODUODENOSCOPY (EGD) WITH PROPOFOL;  Surgeon: West Bali, MD;  [facility];  Service: Endoscopy;  Laterality: N/A;  11:30am  . FLEXIBLE SIGMOIDOSCOPY N/A 12/10/2015   hemorrhoid banding X 3   . HEMORRHOID BANDING N/A 12/10/2015   Procedure: HEMORRHOID BANDING;  Surgeon: West Bali, MD;  [facility];  Service: Endoscopy;  Laterality: N/A;  1:30 PM  . INSERTION OF SUPRAPUBIC CATHETER N/A 02/24/2019   Procedure: INSERTION OF SUPRAPUBIC CATHETER;  Surgeon: Ihor Gully, MD;  [facility];  Service: Urology;  Laterality: N/A;  . IR CM INJ ANY COLONIC TUBE W/FLUORO  10/30/2018  . IR GASTROSTOMY TUBE MOD SED  10/04/2018  . IR GASTROSTOMY TUBE REMOVAL  10/08/2019  . IR IMAGING GUIDED PORT INSERTION  10/04/2018  . IR REMOVAL TUN ACCESS W/ PORT W/O FL MOD SED  10/14/2018  . IR REPLACE G-TUBE SIMPLE WO FLUORO  11/03/2018  . IR REPLACE G-TUBE SIMPLE WO FLUORO  07/28/2019  . LAPAROSCOPIC INSERTION GASTROSTOMY TUBE Left 10/07/2018   Procedure: LAPAROSCOPIC  GASTROSTOMY TUBE;  Surgeon: Kinsinger, De Blanch,  MD;  [facility];  Service: General;  Laterality: Left;  Marland Kitchen MICROLARYNGOSCOPY N/A 09/27/2018   Procedure: MICRO DIRECT LARYNGOSCOPY WITH BIOPSY;  Surgeon: Newman Pies, MD;  Location: [facility];  Service: ENT;  Laterality: N/A;  . None to date     As of 04/14/15  . POLYPECTOMY  05/04/2015   Procedure: POLYPECTOMY;  Surgeon: West Bali, MD;  [facility];  Service: Endoscopy;;  descending colon polyp, ascending colon polyp  . SAVORY DILATION N/A 12/19/2016   Procedure: SAVORY DILATION;  Surgeon: West Bali, MD;  [facility];  Service: Endoscopy;  Laterality: N/A;  . TRACHEOSTOMY TUBE PLACEMENT N/A 09/27/2018   Procedure: AWAKE TRACHEOSTOMY;  Surgeon: Newman Pies, MD;  [facility];  Service: ENT;  Laterality: N/A;  . TRANSURETHRAL RESECTION OF PROSTATE N/A 02/24/2019   Procedure: TRANSURETHRAL RESECTION OF THE PROSTATE (TURP);  Surgeon: Ihor Gully, MD;  [facility];  Service: Urology;  Laterality: N/A;  . VIDEO BRONCHOSCOPY WITH ENDOBRONCHIAL NAVIGATION N/A 04/29/2019   Procedure: VIDEO BRONCHOSCOPY;  Surgeon: Josephine Igo, DO;  [facility];  Service: Pulmonary;  Laterality: N/A;   Social History   Occupational History  . Occupation: Retired    Comment: [employer]  Tobacco Use  . Smoking status: Former Smoker    Packs/day: 1.00    Years: 40.00    Pack years: 40.00    Types: Cigarettes    Quit date: 10/09/2006    Years since quitting: 13.6  . Smokeless tobacco: Never Used  Vaping Use  . Vaping Use: Never used  Substance and Sexual Activity  . Alcohol use: Not Currently    Alcohol/week: 0.0 standard drinks    Comment: he denies 01/06/19  . Drug use: No  . Sexual activity: Yes    Partners: Female    Birth control/protection: None

## 2020-05-27 ENCOUNTER — Other Ambulatory Visit: Payer: Self-pay

## 2020-05-27 ENCOUNTER — Ambulatory Visit (INDEPENDENT_AMBULATORY_CARE_PROVIDER_SITE_OTHER): Payer: Medicare Other | Admitting: Internal Medicine

## 2020-05-27 ENCOUNTER — Encounter: Payer: Self-pay | Admitting: Internal Medicine

## 2020-05-27 VITALS — BP 169/82 | HR 91 | Temp 97.5°F | Ht 69.0 in | Wt 170.0 lb

## 2020-05-27 DIAGNOSIS — K219 Gastro-esophageal reflux disease without esophagitis: Secondary | ICD-10-CM

## 2020-05-27 DIAGNOSIS — D126 Benign neoplasm of colon, unspecified: Secondary | ICD-10-CM | POA: Diagnosis not present

## 2020-05-27 MED ORDER — PEG 3350-KCL-NA BICARB-NACL 420 G PO SOLR: 4000.0000 mL | ORAL | 0 refills | Status: DC

## 2020-05-27 NOTE — Progress Notes (Signed)
Primary Care Physician:  Sharion Balloon, FNP Primary Gastroenterologist:  Dr. [name]  Chief Complaint  Patient presents with   Consult    TCS due. Flex sig done 2017    HPI:   Logan Price is a 75 y.o. male who presents to clinic today to discuss colonoscopy.  Previously seen in January 2019.  Last colonoscopy 2017 with 2 adenomatous polyps removed.  Also noted abnormal appearing IC valve though biopsies were negative.  Patient does have a history of head and neck cancer which is in remission status post radiation therapy also has chronic GERD which is well controlled on pantoprazole, no melena hematochezia.  No unintentional weight loss.  No family history of colon cancer.  No chronic NSAID use.  No dysphagia odynophagia.  Doing well otherwise.  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   HTN (hypertension)    Hyperlipidemia     Past Surgical History:  Procedure Laterality Date   BIOPSY  05/04/2015   Procedure: BIOPSY;  Surgeon: Danie Binder, MD;  [facility];  Service: Endoscopy;;  bx's of ileocecal valve    BRONCHIAL BRUSHINGS  04/29/2019   Procedure: BRONCHIAL BRUSHINGS;  Surgeon: Garner Nash, DO;  Location: [place];  Service: Pulmonary;;   BRONCHIAL NEEDLE ASPIRATION BIOPSY  04/29/2019   Procedure: BRONCHIAL NEEDLE ASPIRATION BIOPSIES;  Surgeon: Garner Nash, DO;  Location: [place];  Service: Pulmonary;;   BRONCHIAL WASHINGS  04/29/2019   Procedure: BRONCHIAL WASHINGS;  Surgeon: Garner Nash, DO;  [facility];  Service: Pulmonary;;   COLONOSCOPY WITH PROPOFOL N/A 05/04/2015   Dr. Oneida Alar: normal appearing ileum with prominent IC valve with tubular adenomas, moderate diverticulosis in sigmoid colon, ascending colon, and  retum. Moderate sized internal hemorrhoids. Surveillance in 5 years   ELECTROMAGNETIC NAVIGATION BROCHOSCOPY  04/29/2019   Procedure: NAVIGATION BRONCHOSCOPY;  Surgeon: Garner Nash, DO;  Location: [place] [facility];  Service: Pulmonary;;   ESOPHAGOGASTRODUODENOSCOPY (EGD) WITH PROPOFOL N/A 12/19/2016   Procedure: ESOPHAGOGASTRODUODENOSCOPY (EGD) WITH PROPOFOL;  Surgeon: Danie Binder, MD;  [facility];  Service: Endoscopy;  Laterality: N/A;  11:30am   FLEXIBLE SIGMOIDOSCOPY N/A 12/10/2015   hemorrhoid banding X 3    HEMORRHOID BANDING N/A 12/10/2015   Procedure: HEMORRHOID BANDING;  Surgeon: Danie Binder, MD;  [facility];  Service: Endoscopy;  Laterality: N/A;  1:30 PM   INSERTION OF SUPRAPUBIC CATHETER N/A 02/24/2019   Procedure: INSERTION OF SUPRAPUBIC CATHETER;  Surgeon: Kathie Rhodes, MD;  [facility];  Service: Urology;  Laterality: N/A;   IR CM INJ ANY COLONIC TUBE W/FLUORO  10/30/2018   IR GASTROSTOMY TUBE MOD SED  10/04/2018   IR GASTROSTOMY TUBE REMOVAL  10/08/2019   IR IMAGING GUIDED PORT INSERTION  10/04/2018   IR REMOVAL TUN ACCESS W/ PORT W/O FL MOD SED  10/14/2018   IR REPLACE G-TUBE SIMPLE WO FLUORO  11/03/2018   IR REPLACE G-TUBE SIMPLE WO FLUORO  07/28/2019   LAPAROSCOPIC INSERTION GASTROSTOMY TUBE Left 10/07/2018   Procedure: LAPAROSCOPIC  GASTROSTOMY TUBE;  Surgeon: Mickeal Skinner, MD;  Location: [place];  Service: General;  Laterality: Left;   MICROLARYNGOSCOPY N/A 09/27/2018   Procedure: MICRO DIRECT LARYNGOSCOPY WITH BIOPSY;  Surgeon:  Leta Baptist, MD;  Location: [place];  Service: ENT;  Laterality: N/A;   None to date     As of 04/14/15   POLYPECTOMY  05/04/2015   Procedure: POLYPECTOMY;  Surgeon: Danie Binder, MD;  [facility];  Service: Endoscopy;;  descending colon polyp, ascending colon polyp   SAVORY DILATION N/A 12/19/2016   Procedure: SAVORY DILATION;  Surgeon: Danie Binder, MD;  [facility];  Service:  Endoscopy;  Laterality: N/A;   TRACHEOSTOMY TUBE PLACEMENT N/A 09/27/2018   Procedure: AWAKE TRACHEOSTOMY;  Surgeon: Leta Baptist, MD;  Location: [place];  Service: ENT;  Laterality: N/A;   TRANSURETHRAL RESECTION OF PROSTATE N/A 02/24/2019   Procedure: TRANSURETHRAL RESECTION OF THE PROSTATE (TURP);  Surgeon: Kathie Rhodes, MD;  [facility];  Service: Urology;  Laterality: N/A;   VIDEO BRONCHOSCOPY WITH ENDOBRONCHIAL NAVIGATION N/A 04/29/2019   Procedure: VIDEO BRONCHOSCOPY;  Surgeon: Garner Nash, DO;  Location: [place];  Service: Pulmonary;  Laterality: N/A;    Current Outpatient Medications  Medication Sig Dispense Refill   diclofenac (VOLTAREN) 75 MG EC tablet TAKE 1 TABLET BY MOUTH TWICE A DAY 60 tablet 2   doxazosin (CARDURA) 1 MG tablet Place 1 tablet (1 mg total) into feeding tube daily. (Patient taking differently: Take 1 mg by mouth daily.) 30 tablet 5   pantoprazole (PROTONIX) 40 MG tablet TAKE ONE TABLET DAILY 30 MINUTES PRIOR TO THE FIRST MEAL 90 tablet 2   simvastatin (ZOCOR) 20 MG tablet TAKE 1 TABLET DAILY AT 6PM 90 tablet 0   polyethylene glycol-electrolytes (TRILYTE) 420 g solution Take 4,000 mLs by mouth as directed. 4000 mL 0   Current Facility-Administered Medications  Medication Dose Route Frequency Provider Last Rate Last Admin   methylPREDNISolone acetate (DEPO-MEDROL) injection 80 mg  80 mg Other Once Magnus Sinning, MD        Allergies as of 05/27/2020   (No Known Allergies)    Family History  Problem Relation Age of Onset   Cancer Mother    Hypertension Father    Hypertension Sister    Hypertension Brother    Hypertension Sister    Aneurysm Brother 66       brain   Colon cancer Neg Hx    Colon polyps Neg Hx     Social History   Socioeconomic History   Marital status: Married    Spouse name: Alma   Number of children: 9   Years of education: GED   Highest education level: GED or equivalent  Occupational History    Occupation: Retired    Comment: department of social services  Tobacco Use   Smoking status: Former Smoker    Packs/day: 1.00    Years: 40.00    Pack years: 40.00    Types: Cigarettes    Quit date: 10/09/2006    Years since quitting: 13.6   Smokeless tobacco: Never Used  Vaping Use   Vaping Use: Never used  Substance and Sexual Activity   Alcohol use: Not Currently    Alcohol/week: 0.0 standard drinks    Comment: he denies 01/06/19   Drug use: No   Sexual activity: Yes    Partners: Female    Birth control/protection: None  Other Topics Concern   Not on file  Social History Narrative   Lives with wife      9 children      GED   Social Determinants of Health   Financial Resource Strain: Low Risk  Difficulty of Paying Living Expenses: Not hard at all  Food Insecurity: No Food Insecurity   Worried About [place] in the Last Year: Never true   Ran Out of Food in the Last Year: Never true  Transportation Needs: No Transportation Needs   Lack of Transportation (Medical): No   Lack of Transportation (Non-Medical): No  Physical Activity: Insufficiently Active   Days of Exercise per Week: 7 days   Minutes of Exercise per Session: 20 min  Stress: No Stress Concern Present   Feeling of Stress : Only a little  Social Connections: Engineer, building services of Communication with Friends and Family: More than three times a week   Frequency of Social Gatherings with Friends and Family: More than three times a week   Attends Religious Services: More than 4 times per year   Active Member of [employer] or Organizations: Yes   Attends Music therapist: More than 4 times per year   Marital Status: Married  Human resources officer Violence: Not At Risk   Fear of Current or Ex-Partner: No   Emotionally Abused: No   Physically Abused: No   Sexually Abused: No    Subjective: Review of Systems  Constitutional: Negative for chills and  fever.  HENT: Negative for congestion and hearing loss.   Eyes: Negative for blurred vision and double vision.  Respiratory: Negative for cough and shortness of breath.   Cardiovascular: Negative for chest pain and palpitations.  Gastrointestinal: Negative for abdominal pain, blood in stool, constipation, diarrhea, heartburn, melena and vomiting.  Genitourinary: Negative for dysuria and urgency.  Musculoskeletal: Negative for joint pain and myalgias.  Skin: Negative for itching and rash.  Neurological: Negative for dizziness and headaches.  Psychiatric/Behavioral: Negative for depression. The patient is not nervous/anxious.        Objective: BP (!) 169/82    Pulse 91    Temp (!) 97.5 F (36.4 C)    Ht 5\' 9"  (1.753 m)    Wt 170 lb (77.1 kg)    BMI 25.10 kg/m  Physical Exam Constitutional:      Appearance: Normal appearance.  HENT:     Head: Normocephalic and atraumatic.  Eyes:     Extraocular Movements: Extraocular movements intact.     Conjunctiva/sclera: Conjunctivae normal.  Cardiovascular:     Rate and Rhythm: Normal rate and regular rhythm.  Pulmonary:     Effort: Pulmonary effort is normal.     Breath sounds: Normal breath sounds.  Abdominal:     General: Bowel sounds are normal.     Palpations: Abdomen is soft.  Musculoskeletal:        General: Normal range of motion.     Cervical back: Normal range of motion and neck supple.  Skin:    General: Skin is warm.  Neurological:     General: No focal deficit present.     Mental Status: He is alert and oriented to person, place, and time.  Psychiatric:        Mood and Affect: Mood normal.        Behavior: Behavior normal.      Assessment: *GERD-well-controlled on pantoprazole *Adenomatous colon polyps  Plan: Will schedule for  surveillance colonoscopy due to history of adenomatous colon polyp.The risks including infection, bleed, or perforation as well as benefits, limitations, alternatives and imponderables have  been reviewed with the patient. Questions have been answered. All parties agreeable.   Continue on pantoprazole for chronic GERD which is  well controlled.  05/27/2020 4:33 PM   Disclaimer: This note was dictated with voice recognition software. Similar sounding words can inadvertently be transcribed and may not be corrected upon review.

## 2020-05-27 NOTE — Patient Instructions (Signed)
We will schedule you for colonoscopy given history of polyps.  This will likely be the last colonoscopy.  Further recommendations to follow.  At [facility] we value your feedback. You may receive a survey about your visit today. Please share your experience as we strive to create trusting relationships with our patients to provide genuine, compassionate, quality care.  We appreciate your understanding and patience as we review any laboratory studies, imaging, and other diagnostic tests that are ordered as we care for you. Our office policy is 5 business days for review of these results, and any emergent or urgent results are addressed in a timely manner for your best interest. If you do not hear from our office in 1 week, please contact us.   We also encourage the use of MyChart, which contains your medical information for your review as well. If you are not enrolled in this feature, an access code is on this after visit summary for your convenience. Thank you for allowing [place] to be involved in your care.  It was great to see you today!  I hope you have a great rest of your spring!!    Logan Price. [name], D.O. Gastroenterology and Hepatology [facility]   .

## 2020-05-27 NOTE — H&P (View-Only) (Signed)
Primary Care Physician:  Sharion Balloon, FNP Primary Gastroenterologist:  Dr. [name]  Chief Complaint  Patient presents with  . Consult    TCS due. Flex sig done 2017    HPI:   Logan Price is a 75 y.o. male who presents to clinic today to discuss colonoscopy.  Previously seen in January 2019.  Last colonoscopy 2017 with 2 adenomatous polyps removed.  Also noted abnormal appearing IC valve though biopsies were negative.  Patient does have a history of head and neck cancer which is in remission status post radiation therapy also has chronic GERD which is well controlled on pantoprazole, no melena hematochezia.  No unintentional weight loss.  No family history of colon cancer.  No chronic NSAID use.  No dysphagia odynophagia.  Doing well otherwise.  Past Medical History:  Diagnosis Date  . Diverticulitis   . Dysrhythmia 09/2018   episode of SVT while in hospital  . 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 GERD (gastroesophageal reflux disease)   . 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 HTN (hypertension)   . Hyperlipidemia     Past Surgical History:  Procedure Laterality Date  . BIOPSY  05/04/2015   Procedure: BIOPSY;  Surgeon: Danie Binder, MD;  [facility];  Service: Endoscopy;;  bx's of ileocecal valve   . BRONCHIAL BRUSHINGS  04/29/2019   Procedure: BRONCHIAL BRUSHINGS;  Surgeon: Garner Nash, DO;  Location: [place];  Service: Pulmonary;;  . BRONCHIAL NEEDLE ASPIRATION BIOPSY  04/29/2019   Procedure: BRONCHIAL NEEDLE ASPIRATION BIOPSIES;  Surgeon: Garner Nash, DO;  Location: [place];  Service: Pulmonary;;  . BRONCHIAL WASHINGS  04/29/2019   Procedure: BRONCHIAL WASHINGS;  Surgeon: Garner Nash, DO;  [facility];  Service: Pulmonary;;  . COLONOSCOPY WITH PROPOFOL N/A 05/04/2015   Dr. Oneida Alar: normal appearing ileum with prominent IC valve with tubular adenomas, moderate diverticulosis in sigmoid colon, ascending colon, and  retum. Moderate sized internal hemorrhoids. Surveillance in 5 years  . ELECTROMAGNETIC NAVIGATION BROCHOSCOPY  04/29/2019   Procedure: NAVIGATION BRONCHOSCOPY;  Surgeon: Garner Nash, DO;  Location: [place] [facility];  Service: Pulmonary;;  . ESOPHAGOGASTRODUODENOSCOPY (EGD) WITH PROPOFOL N/A 12/19/2016   Procedure: ESOPHAGOGASTRODUODENOSCOPY (EGD) WITH PROPOFOL;  Surgeon: Danie Binder, MD;  [facility];  Service: Endoscopy;  Laterality: N/A;  11:30am  . FLEXIBLE SIGMOIDOSCOPY N/A 12/10/2015   hemorrhoid banding X 3   . HEMORRHOID BANDING N/A 12/10/2015   Procedure: HEMORRHOID BANDING;  Surgeon: Danie Binder, MD;  [facility];  Service: Endoscopy;  Laterality: N/A;  1:30 PM  . INSERTION OF SUPRAPUBIC CATHETER N/A 02/24/2019   Procedure: INSERTION OF SUPRAPUBIC CATHETER;  Surgeon: Kathie Rhodes, MD;  [facility];  Service: Urology;  Laterality: N/A;  . IR CM INJ ANY COLONIC TUBE W/FLUORO  10/30/2018  . IR GASTROSTOMY TUBE MOD SED  10/04/2018  . IR GASTROSTOMY TUBE REMOVAL  10/08/2019  . IR IMAGING GUIDED PORT INSERTION  10/04/2018  . IR REMOVAL TUN ACCESS W/ PORT W/O FL MOD SED  10/14/2018  . IR REPLACE G-TUBE SIMPLE WO FLUORO  11/03/2018  . IR REPLACE G-TUBE SIMPLE WO FLUORO  07/28/2019  . LAPAROSCOPIC INSERTION GASTROSTOMY TUBE Left 10/07/2018   Procedure: LAPAROSCOPIC  GASTROSTOMY TUBE;  Surgeon: Kinsinger, Arta Bruce, MD;  Location: [place];  Service: General;  Laterality: Left;  Marland Kitchen MICROLARYNGOSCOPY N/A 09/27/2018   Procedure: MICRO DIRECT LARYNGOSCOPY WITH BIOPSY;  Surgeon:  Leta Baptist, MD;  Location: [place];  Service: ENT;  Laterality: N/A;  . None to date     As of 04/14/15  . POLYPECTOMY  05/04/2015   Procedure: POLYPECTOMY;  Surgeon: Danie Binder, MD;  [facility];  Service: Endoscopy;;  descending colon polyp, ascending colon polyp  . SAVORY DILATION N/A 12/19/2016   Procedure: SAVORY DILATION;  Surgeon: Danie Binder, MD;  [facility];  Service:  Endoscopy;  Laterality: N/A;  . TRACHEOSTOMY TUBE PLACEMENT N/A 09/27/2018   Procedure: AWAKE TRACHEOSTOMY;  Surgeon: Leta Baptist, MD;  Location: [place];  Service: ENT;  Laterality: N/A;  . TRANSURETHRAL RESECTION OF PROSTATE N/A 02/24/2019   Procedure: TRANSURETHRAL RESECTION OF THE PROSTATE (TURP);  Surgeon: Kathie Rhodes, MD;  [facility];  Service: Urology;  Laterality: N/A;  . VIDEO BRONCHOSCOPY WITH ENDOBRONCHIAL NAVIGATION N/A 04/29/2019   Procedure: VIDEO BRONCHOSCOPY;  Surgeon: Garner Nash, DO;  Location: [place];  Service: Pulmonary;  Laterality: N/A;    Current Outpatient Medications  Medication Sig Dispense Refill  . diclofenac (VOLTAREN) 75 MG EC tablet TAKE 1 TABLET BY MOUTH TWICE A DAY 60 tablet 2  . doxazosin (CARDURA) 1 MG tablet Place 1 tablet (1 mg total) into feeding tube daily. (Patient taking differently: Take 1 mg by mouth daily.) 30 tablet 5  . pantoprazole (PROTONIX) 40 MG tablet TAKE ONE TABLET DAILY 30 MINUTES PRIOR TO THE FIRST MEAL 90 tablet 2  . simvastatin (ZOCOR) 20 MG tablet TAKE 1 TABLET DAILY AT 6PM 90 tablet 0  . polyethylene glycol-electrolytes (TRILYTE) 420 g solution Take 4,000 mLs by mouth as directed. 4000 mL 0   Current Facility-Administered Medications  Medication Dose Route Frequency Provider Last Rate Last Admin  . methylPREDNISolone acetate (DEPO-MEDROL) injection 80 mg  80 mg Other Once Magnus Sinning, MD        Allergies as of 05/27/2020  . (No Known Allergies)    Family History  Problem Relation Age of Onset  . Cancer Mother   . Hypertension Father   . Hypertension Sister   . Hypertension Brother   . Hypertension Sister   . Aneurysm Brother 34       brain  . Colon cancer Neg Hx   . Colon polyps Neg Hx     Social History   Socioeconomic History  . Marital status: Married    Spouse name: Alma  . Number of children: 9  . Years of education: GED  . Highest education level: GED or equivalent  Occupational History  .  Occupation: Retired    Comment: department of social services  Tobacco Use  . Smoking status: Former Smoker    Packs/day: 1.00    Years: 40.00    Pack years: 40.00    Types: Cigarettes    Quit date: 10/09/2006    Years since quitting: 13.6  . Smokeless tobacco: Never Used  Vaping Use  . Vaping Use: Never used  Substance and Sexual Activity  . Alcohol use: Not Currently    Alcohol/week: 0.0 standard drinks    Comment: he denies 01/06/19  . Drug use: No  . Sexual activity: Yes    Partners: Female    Birth control/protection: None  Other Topics Concern  . Not on file  Social History Narrative   Lives with wife      9 children      GED   Social Determinants of Health   Financial Resource Strain: Low Risk   .  Difficulty of Paying Living Expenses: Not hard at all  Food Insecurity: No Food Insecurity  . Worried About Charity fundraiser in the Last Year: Never true  . Ran Out of Food in the Last Year: Never true  Transportation Needs: No Transportation Needs  . Lack of Transportation (Medical): No  . Lack of Transportation (Non-Medical): No  Physical Activity: Insufficiently Active  . Days of Exercise per Week: 7 days  . Minutes of Exercise per Session: 20 min  Stress: No Stress Concern Present  . Feeling of Stress : Only a little  Social Connections: Socially Integrated  . Frequency of Communication with Friends and Family: More than three times a week  . Frequency of Social Gatherings with Friends and Family: More than three times a week  . Attends Religious Services: More than 4 times per year  . Active Member of Clubs or Organizations: Yes  . Attends Archivist Meetings: More than 4 times per year  . Marital Status: Married  Human resources officer Violence: Not At Risk  . Fear of Current or Ex-Partner: No  . Emotionally Abused: No  . Physically Abused: No  . Sexually Abused: No    Subjective: Review of Systems  Constitutional: Negative for chills and  fever.  HENT: Negative for congestion and hearing loss.   Eyes: Negative for blurred vision and double vision.  Respiratory: Negative for cough and shortness of breath.   Cardiovascular: Negative for chest pain and palpitations.  Gastrointestinal: Negative for abdominal pain, blood in stool, constipation, diarrhea, heartburn, melena and vomiting.  Genitourinary: Negative for dysuria and urgency.  Musculoskeletal: Negative for joint pain and myalgias.  Skin: Negative for itching and rash.  Neurological: Negative for dizziness and headaches.  Psychiatric/Behavioral: Negative for depression. The patient is not nervous/anxious.        Objective: BP (!) 169/82   Pulse 91   Temp (!) 97.5 F (36.4 C)   Ht 5\' 9"  (1.753 m)   Wt 170 lb (77.1 kg)   BMI 25.10 kg/m  Physical Exam Constitutional:      Appearance: Normal appearance.  HENT:     Head: Normocephalic and atraumatic.  Eyes:     Extraocular Movements: Extraocular movements intact.     Conjunctiva/sclera: Conjunctivae normal.  Cardiovascular:     Rate and Rhythm: Normal rate and regular rhythm.  Pulmonary:     Effort: Pulmonary effort is normal.     Breath sounds: Normal breath sounds.  Abdominal:     General: Bowel sounds are normal.     Palpations: Abdomen is soft.  Musculoskeletal:        General: Normal range of motion.     Cervical back: Normal range of motion and neck supple.  Skin:    General: Skin is warm.  Neurological:     General: No focal deficit present.     Mental Status: He is alert and oriented to person, place, and time.  Psychiatric:        Mood and Affect: Mood normal.        Behavior: Behavior normal.      Assessment: *GERD-well-controlled on pantoprazole *Adenomatous colon polyps  Plan: Will schedule for  surveillance colonoscopy due to history of adenomatous colon polyp.The risks including infection, bleed, or perforation as well as benefits, limitations, alternatives and imponderables have  been reviewed with the patient. Questions have been answered. All parties agreeable.   Continue on pantoprazole for chronic GERD which is well controlled.  05/27/2020 4:33  PM   Disclaimer: This note was dictated with voice recognition software. Similar sounding words can inadvertently be transcribed and may not be corrected upon review.

## 2020-06-02 NOTE — Procedures (Signed)
S1 Lumbosacral Transforaminal Epidural Steroid Injection - Sub-Pedicular Approach with Fluoroscopic Guidance   Patient: Logan Price      Date of Birth: 06-May-1945 MRN: [id number] PCP: Sharion Balloon, FNP      Visit Date: 04/26/2020   Universal Protocol:    Date/Time: 03/09/225:56 AM  Consent Given By: the patient  Position:  PRONE  Additional Comments: Vital signs were monitored before and after the procedure. Patient was prepped and draped in the usual sterile fashion. The correct patient, procedure, and site was verified.   Injection Procedure Details:  Procedure Site One Meds Administered:  Meds ordered this encounter  Medications  . methylPREDNISolone acetate (DEPO-MEDROL) injection 80 mg    Laterality: Bilateral  Location/Site:  S1 Foramen   Needle size: 22 ga.  Needle type: Spinal  Needle Placement: Transforaminal  Findings:   -Comments: Excellent flow of contrast along the nerve, nerve root and into the epidural space.  Epidurogram: Contrast epidurogram showed no nerve root cut off or restricted flow pattern.  Procedure Details: After squaring off the sacral end-plate to get a true AP view, the C-arm was positioned so that the best possible view of the S1 foramen was visualized. The soft tissues overlying this structure were infiltrated with 2-3 ml. of 1% Lidocaine without Epinephrine.    The spinal needle was inserted toward the target using a "trajectory" view along the fluoroscope beam.  Under AP and lateral visualization, the needle was advanced so it did not puncture dura. Biplanar projections were used to confirm position. Aspiration was confirmed to be negative for CSF and/or blood. A 1-2 ml. volume of Isovue-250 was injected and flow of contrast was noted at each level. Radiographs were obtained for documentation purposes.   After attaining the desired flow of contrast documented above, a 0.5 to 1.0 ml test dose of 0.25% Marcaine was injected into  each respective transforaminal space.  The patient was observed for 90 seconds post injection.  After no sensory deficits were reported, and normal lower extremity motor function was noted,   the above injectate was administered so that equal amounts of the injectate were placed at each foramen (level) into the transforaminal epidural space.   Additional Comments:  The patient tolerated the procedure well Dressing: Band-Aid with 2 x 2 sterile gauze    Post-procedure details: Patient was observed during the procedure. Post-procedure instructions were reviewed.  Patient left the clinic in stable condition.

## 2020-06-02 NOTE — Progress Notes (Signed)
Logan Price - 75 y.o. male MRN [id number]  Date of birth: 1945-04-06  Office Visit Note: Visit Date: 04/26/2020 PCP: [name], FNP Referred by: [name], FNP  Subjective: Chief Complaint  Patient presents with  . Lower Back - Pain   HPI:  Logan Price is a 75 y.o. male who comes in today at the request of Dr. Norlene Campbell for planned Bilateral S1-2 Lumbar epidural steroid injection with fluoroscopic guidance.  The patient has failed conservative care including home exercise, medications, time and activity modification.  This injection will be diagnostic and hopefully therapeutic.  Please see requesting physician notes for further details and justification.   ROS Otherwise per HPI.  Assessment & Plan: Visit Diagnoses:    ICD-10-CM   1. Lumbar radiculopathy  M54.16 XR C-ARM NO REPORT    Epidural Steroid injection    methylPREDNISolone acetate (DEPO-MEDROL) injection 80 mg    Plan: No additional findings.   Meds & Orders:  Meds ordered this encounter  Medications  . methylPREDNISolone acetate (DEPO-MEDROL) injection 80 mg    Orders Placed This Encounter  Procedures  . XR C-ARM NO REPORT  . Epidural Steroid injection    Follow-up: Return if symptoms worsen or fail to improve.   Procedures: No procedures performed  S1 Lumbosacral Transforaminal Epidural Steroid Injection - Sub-Pedicular Approach with Fluoroscopic Guidance   Patient: Logan Price      Date of Birth: 1945-04-09 MRN: [id number] PCP: [name], FNP      Visit Date: 04/26/2020   Universal Protocol:    Date/Time: 03/09/225:56 AM  Consent Given By: the patient  Position:  PRONE  Additional Comments: Vital signs were monitored before and after the procedure. Patient was prepped and draped in the usual sterile fashion. The correct patient, procedure, and site was verified.   Injection Procedure Details:  Procedure Site One Meds Administered:  Meds ordered this encounter   Medications  . methylPREDNISolone acetate (DEPO-MEDROL) injection 80 mg    Laterality: Bilateral  Location/Site:  S1 Foramen   Needle size: 22 ga.  Needle type: Spinal  Needle Placement: Transforaminal  Findings:   -Comments: Excellent flow of contrast along the nerve, nerve root and into the epidural space.  Epidurogram: Contrast epidurogram showed no nerve root cut off or restricted flow pattern.  Procedure Details: After squaring off the sacral end-plate to get a true AP view, the C-arm was positioned so that the best possible view of the S1 foramen was visualized. The soft tissues overlying this structure were infiltrated with 2-3 ml. of 1% Lidocaine without Epinephrine.    The spinal needle was inserted toward the target using a "trajectory" view along the fluoroscope beam.  Under AP and lateral visualization, the needle was advanced so it did not puncture dura. Biplanar projections were used to confirm position. Aspiration was confirmed to be negative for CSF and/or blood. A 1-2 ml. volume of Isovue-250 was injected and flow of contrast was noted at each level. Radiographs were obtained for documentation purposes.   After attaining the desired flow of contrast documented above, a 0.5 to 1.0 ml test dose of 0.25% Marcaine was injected into each respective transforaminal space.  The patient was observed for 90 seconds post injection.  After no sensory deficits were reported, and normal lower extremity motor function was noted,   the above injectate was administered so that equal amounts of the injectate were placed at each foramen (level) into the transforaminal epidural space.  Additional Comments:  The patient tolerated the procedure well Dressing: Band-Aid with 2 x 2 sterile gauze    Post-procedure details: Patient was observed during the procedure. Post-procedure instructions were reviewed.  Patient left the clinic in stable condition.     Clinical History: MRI  LUMBAR SPINE WITHOUT CONTRAST  TECHNIQUE: Multiplanar, multisequence MR imaging of the lumbar spine was performed. No intravenous contrast was administered.  COMPARISON:  10/07/2019.  FINDINGS: Segmentation:  Standard.  Alignment:  Normal.  Vertebrae: Normal bone marrow signal intensity. No aggressive osseous lesions.  Conus medullaris and cauda equina: Conus extends to the L2 level. Conus and cauda equina appear normal.  Disc levels: Multilevel desiccation.  L1-2: Mild disc bulge and facet degenerative spurring. Patent spinal canal and neural foramen.  L2-3: Mild disc bulge with shallow left extraforaminal protrusion. Bilateral facet degenerative spurring. Patent spinal canal and neural foramen.  L3-4: Mild disc bulge and bilateral facet degenerative spurring. Grazing of the exiting left L3 nerve root. Patent spinal canal. Mild bilateral neural foraminal narrowing.  L4-5: Disc bulge and bilateral facet hypertrophy. Ligamentum flavum thickening. Far left lateral annular fissuring. Mild spinal canal, mild left and moderate right neural foraminal narrowing.  L5-S1: Disc bulge grazing the exiting L5 and descending S1 nerve roots. Prominent ligamentum flavum and bilateral facet hypertrophy. Patent spinal canal. Mild bilateral neural foraminal narrowing.  Paraspinal and other soft tissues: Right renal cysts.  IMPRESSION: Multilevel spondylosis. Mild spinal canal, moderate right and mild left neural foraminal narrowing at the L4-5 level.  Mild bilateral L3-4 and bilateral L5-S1 neural foraminal narrowing.   Electronically Signed   By: Stana Bunting M.D.   On: 03/24/2020 16:44     Objective:  VS:  HT:    WT:   BMI:     BP:(!) 170/90  HR:87bpm  TEMP: ( )  RESP:  Physical Exam Vitals and nursing note reviewed.  Constitutional:      General: He is not in acute distress.    Appearance: Normal appearance. He is not ill-appearing.  HENT:      Head: Normocephalic and atraumatic.     Right Ear: External ear normal.     Left Ear: External ear normal.     Nose: No congestion.  Eyes:     Extraocular Movements: Extraocular movements intact.  Cardiovascular:     Rate and Rhythm: Normal rate.     Pulses: Normal pulses.  Pulmonary:     Effort: Pulmonary effort is normal. No respiratory distress.  Abdominal:     General: There is no distension.     Palpations: Abdomen is soft.  Musculoskeletal:        General: No tenderness or signs of injury.     Cervical back: Neck supple.     Right lower leg: No edema.     Left lower leg: No edema.     Comments: Patient has good distal strength without clonus.  Skin:    Findings: No erythema or rash.  Neurological:     General: No focal deficit present.     Mental Status: He is alert and oriented to person, place, and time.     Sensory: No sensory deficit.     Motor: No weakness or abnormal muscle tone.     Coordination: Coordination normal.  Psychiatric:        Mood and Affect: Mood normal.        Behavior: Behavior normal.      Imaging: No results found.

## 2020-06-10 ENCOUNTER — Ambulatory Visit (INDEPENDENT_AMBULATORY_CARE_PROVIDER_SITE_OTHER): Payer: Medicare Other | Admitting: Licensed Clinical Social Worker

## 2020-06-10 DIAGNOSIS — I10 Essential (primary) hypertension: Secondary | ICD-10-CM | POA: Diagnosis not present

## 2020-06-10 DIAGNOSIS — E785 Hyperlipidemia, unspecified: Secondary | ICD-10-CM

## 2020-06-10 DIAGNOSIS — K219 Gastro-esophageal reflux disease without esophagitis: Secondary | ICD-10-CM

## 2020-06-10 DIAGNOSIS — R1319 Other dysphagia: Secondary | ICD-10-CM

## 2020-06-10 DIAGNOSIS — R911 Solitary pulmonary nodule: Secondary | ICD-10-CM

## 2020-06-10 DIAGNOSIS — E559 Vitamin D deficiency, unspecified: Secondary | ICD-10-CM

## 2020-06-10 NOTE — Patient Instructions (Signed)
Visit Information  PATIENT GOALS: Goals Addressed            This Visit's Progress   . Protect My Health       Timeframe:  Short-Term Goal Priority:  Medium Start Date:      06/10/20                       Expected End Date:     09/10/20                  Follow Up Date 07/12/20    Protect My Health (Patient) Patient will work to improve mobility, manage pain issues and complete ADLs daily    Why is this important?    Screening tests can find diseases early when they are easier to treat.   Your doctor or nurse will talk with you about which tests are important for you.   Getting shots for common diseases like the flu and shingles will help prevent them.      Patient Strengths:  Attends scheduled medical appointments Takes medications as prescribed Has support from spouse Drives to needed appointments  Patient Deficits:  Pain issues and mobility issues  Patient Goals:   To attend scheduled medical appointments To take medications as prescribed To communicate regularly with his spouse to discuss client needs To communicate with RNCM or LCSW as needed for CCM support To participate in physical therapy sessions as scheduled -  Follow Up Plan: LCSW to call client or spouse of client on 07/12/20 to assess client needs       Norva Riffle.Forrest MSW, LCSW Licensed Clinical Social Worker [facility] Care Management [phone]

## 2020-06-10 NOTE — Chronic Care Management (AMB) (Signed)
Chronic Care Management    Clinical Social Work Note  06/10/2020 Name: Logan Price MRN: [id number] DOB: [birth year]  Logan Price is a 75 y.o. year old male who is a primary care patient of [name], FNP. The CCM team was consulted to assist the patient with chronic disease management and/or care coordination needs related to: [name] .   Engaged with patient by telephone for follow up visit in response to provider referral for social work chronic care management and care coordination services.   Consent to Services:  The patient was given information about Chronic Care Management services, agreed to services, and gave verbal consent prior to initiation of services.  Please see initial visit note for detailed documentation.   Patient agreed to services and consent obtained.   Assessment: Review of patient past medical history, allergies, medications, and health status, including review of relevant consultants reports was performed today as part of a comprehensive evaluation and provision of chronic care management and care coordination services.     SDOH (Social Determinants of Health) assessments and interventions performed:    Advanced Directives Status: See Vynca application for related entries.  CCM Care Plan  No Known Allergies  Outpatient Encounter Medications as of 06/10/2020  Medication Sig  . diclofenac (VOLTAREN) 75 MG EC tablet TAKE 1 TABLET BY MOUTH TWICE A DAY (Patient taking differently: Take 75 mg by mouth 2 (two) times daily.)  . doxazosin (CARDURA) 1 MG tablet Place 1 tablet (1 mg total) into feeding tube daily. (Patient taking differently: Take 1 mg by mouth daily.)  . pantoprazole (PROTONIX) 40 MG tablet TAKE ONE TABLET DAILY 30 MINUTES PRIOR TO THE FIRST MEAL (Patient taking differently: Take 40 mg by mouth every morning. TAKE ONE TABLET DAILY 30 MINUTES PRIOR TO THE FIRST MEAL)  . polyethylene glycol-electrolytes (TRILYTE) 420 g solution Take  4,000 mLs by mouth as directed.  . simvastatin (ZOCOR) 20 MG tablet TAKE 1 TABLET DAILY AT 6PM (Patient taking differently: Take 20 mg by mouth daily.)   No facility-administered encounter medications on file as of 06/10/2020.    Patient Active Problem List   Diagnosis Date Noted  . Low back pain 03/10/2020  . Malignant neoplasm of right upper lobe of lung (HCC) 05/27/2019  . Primary cancer of left upper lobe of lung (HCC) 05/27/2019  . Pseudomonas aeruginosa infection 05/21/2019  . Tracheobronchitis 05/21/2019  . Hx of radiation therapy 05/21/2019  . 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HCC) 05/21/2019  . Squamous cell lung cancer, right (HCC) 05/21/2019  . Adenocarcinoma, lung, left (HCC) 05/21/2019  . Lung nodule 04/23/2019  . Ground glass opacity present on imaging of lung 04/23/2019  . Head and neck cancer (HCC) 04/23/2019  . 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 Pleural effusion   . 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HCC)   . Bacteremia   . Leukocytosis   . Normocytic anemia   . PSVT (paroxysmal supraventricular tachycardia) (HCC) 10/03/2018  . Atrial fibrillation (HCC) 10/03/2018  . Goals of care, counseling/discussion   . Dysphagia   . Loss of weight   . 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 Gastritis determined by endoscopy   . Esophageal dysphagia   . 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 Globus sensation 12/12/2016  . Smokes with greater than 40 pack year history 10/20/2016  . Hemorrhoids, internal, with bleeding 10/06/2015  . History of colonic polyps 04/14/2015  . Vitamin D deficiency 10/09/2014  . HLD (hyperlipidemia)  10/08/2012  . GERD (gastroesophageal reflux disease) 10/08/2012  . HTN (hypertension)   . Diverticulitis 10/25/2009    Conditions to be addressed/monitored: ; Pain issues, mobility issues, ADLs completion  Care Plan : LCSW care plan  Updates made by Isaiah Blakes, LCSW since 06/10/2020 12:00 AM    Problem: Coping  Skills (General Plan of Care)     Goal: Manage mobility issues, manage pain issues and work to complete ADLs daily   Start Date: 06/10/2020  Expected End Date: 09/10/2020  This Visit's Progress: On track  Priority: High  Note:   Current barriers:   . Patient in need of assistance with connecting to community resources for helping client find needed resources to help him with managing mobility and ADLs completion . Acknowledges deficits with meeting this unmet need . Patient is unable to independently navigate community resource options without care coordination support . Adenocarcinoma of Left Lung . Pain issues  Clinical Goals:  Patient will communicate with LCSW in next 30 days to discuss community resources of possible help to client Patient to communicate with LCSW in next 30 days to discuss client mobility and pain issues of client Patient to communicate with RNCM as needed to discuss nursing needs of client  Clinical Interventions:  . Collaboration with [name], FNP regarding development and update of comprehensive plan of care as evidenced by provider attestation and co-signature . Assessment of needs, barriers  of client  . Chart review to assess client management of pain and mobility issues  . Talked with client about pain issues (client spoke of pain in his back) . Talked with client about mobility (he uses a cane as needed to walk) . Talked with client about appetite of client . Talked with client about sleeping issues of client . Talked with client about medication procurement . Talked with client about client completion of ADLs . Talked with client about upcoming client medical appointments  Patient Strengths:  Attends scheduled medical appointments Takes medications as prescribed Has support from spouse Drives to needed appointments  Patient Deficits:  Pain issues and mobility issues  Patient Goals:   To attend scheduled medical appointments To take  medications as prescribed To communicate regularly with his spouse to discuss client needs To communicate with RNCM or LCSW as needed for CCM support To participate in physical therapy sessions as scheduled -  Follow Up Plan: LCSW to call client or spouse of client on 07/12/20 to assess client needs      Kelton Pillar.Kenzey Birkland MSW, LCSW Licensed Clinical Social Worker [facility] Care Management [phone]

## 2020-06-16 ENCOUNTER — Other Ambulatory Visit: Payer: Self-pay | Admitting: Orthopaedic Surgery

## 2020-06-16 DIAGNOSIS — Z136 Encounter for screening for cardiovascular disorders: Secondary | ICD-10-CM

## 2020-06-16 NOTE — Patient Instructions (Signed)
Logan Price  06/16/2020     @PREFPERIOPPHARMACY @   Your procedure is scheduled on  06/22/2020.   Report to [facility] at  1245  P.M.   Call this number if you have problems the morning of surgery:  [phone]   Remember:  Follow the diet and prep instructions given to you by the office.                     Take these medicines the morning of surgery with A SIP OF WATER  Voltaren, cardura, protonix.    Please brush your teeth.  Do not wear jewelry, make-up or nail polish.  Do not wear lotions, powders, or perfumes, or deodorant.  Do not shave 48 hours prior to surgery.  Men may shave face and neck.  Do not bring valuables to the hospital.  [facility] is not responsible for any belongings or valuables.  Contacts, dentures or bridgework may not be worn into surgery.  Leave your suitcase in the car.  After surgery it may be brought to your room.  For patients admitted to the hospital, discharge time will be determined by your treatment team.  Patients discharged the day of surgery will not be allowed to drive home and must have someone with them for 24 hours.    Special instructions:  DO NOT smoke tobacco or vape the morning of your procedure.   Please read over the following fact sheets that you were given. Anesthesia Post-op Instructions and Care and Recovery After Surgery       Colonoscopy, Adult, Care After This sheet gives you information about how to care for yourself after your procedure. Your health care provider may also give you more specific instructions. If you have problems or questions, contact your health care provider. What can I expect after the procedure? After the procedure, it is common to have:  A small amount of blood in your stool for 24 hours after the procedure.  Some gas.  Mild cramping or bloating of your abdomen. Follow these instructions at home: Eating and drinking  Drink enough fluid to keep your urine pale  yellow.  Follow instructions from your health care provider about eating or drinking restrictions.  Resume your normal diet as instructed by your health care provider. Avoid heavy or fried foods that are hard to digest.   Activity  Rest as told by your health care provider.  Avoid sitting for a long time without moving. Get up to take short walks every 1-2 hours. This is important to improve blood flow and breathing. Ask for help if you feel weak or unsteady.  Return to your normal activities as told by your health care provider. Ask your health care provider what activities are safe for you. Managing cramping and bloating  Try walking around when you have cramps or feel bloated.  Apply heat to your abdomen as told by your health care provider. Use the heat source that your health care provider recommends, such as a moist heat pack or a heating pad. ? Place a towel between your skin and the heat source. ? Leave the heat on for 20-30 minutes. ? Remove the heat if your skin turns bright red. This is especially important if you are unable to feel pain, heat, or cold. You may have a greater risk of getting burned.   General instructions  If you were given a sedative during the procedure, it can affect  you for several hours. Do not drive or operate machinery until your health care provider says that it is safe.  For the first 24 hours after the procedure: ? Do not sign important documents. ? Do not drink alcohol. ? Do your regular daily activities at a slower pace than normal. ? Eat soft foods that are easy to digest.  Take over-the-counter and prescription medicines only as told by your health care provider.  Keep all follow-up visits as told by your health care provider. This is important. Contact a health care provider if:  You have blood in your stool 2-3 days after the procedure. Get help right away if you have:  More than a small spotting of blood in your stool.  Large blood  clots in your stool.  Swelling of your abdomen.  Nausea or vomiting.  A fever.  Increasing pain in your abdomen that is not relieved with medicine. Summary  After the procedure, it is common to have a small amount of blood in your stool. You may also have mild cramping and bloating of your abdomen.  If you were given a sedative during the procedure, it can affect you for several hours. Do not drive or operate machinery until your health care provider says that it is safe.  Get help right away if you have a lot of blood in your stool, nausea or vomiting, a fever, or increased pain in your abdomen. This information is not intended to replace advice given to you by your health care provider. Make sure you discuss any questions you have with your health care provider. Document Revised: 03/07/2019 Document Reviewed: 10/07/2018 Elsevier Patient Education  2021 [employer] After This sheet gives you information about how to care for yourself after your procedure. Your health care provider may also give you more specific instructions. If you have problems or questions, contact your health care provider. What can I expect after the procedure? After the procedure, it is common to have:  Tiredness.  Forgetfulness about what happened after the procedure.  Impaired judgment for important decisions.  Nausea or vomiting.  Some difficulty with balance. Follow these instructions at home: For the time period you were told by your health care provider:  Rest as needed.  Do not participate in activities where you could fall or become injured.  Do not drive or use machinery.  Do not drink alcohol.  Do not take sleeping pills or medicines that cause drowsiness.  Do not make important decisions or sign legal documents.  Do not take care of children on your own.      Eating and drinking  Follow the diet that is recommended by your health care  provider.  Drink enough fluid to keep your urine pale yellow.  If you vomit: ? Drink water, juice, or soup when you can drink without vomiting. ? Make sure you have little or no nausea before eating solid foods. General instructions  Have a responsible adult stay with you for the time you are told. It is important to have someone help care for you until you are awake and alert.  Take over-the-counter and prescription medicines only as told by your health care provider.  If you have sleep apnea, surgery and certain medicines can increase your risk for breathing problems. Follow instructions from your health care provider about wearing your sleep device: ? Anytime you are sleeping, including during daytime naps. ? While taking prescription pain medicines, sleeping medicines, or medicines  that make you drowsy.  Avoid smoking.  Keep all follow-up visits as told by your health care provider. This is important. Contact a health care provider if:  You keep feeling nauseous or you keep vomiting.  You feel light-headed.  You are still sleepy or having trouble with balance after 24 hours.  You develop a rash.  You have a fever.  You have redness or swelling around the IV site. Get help right away if:  You have trouble breathing.  You have new-onset confusion at home. Summary  For several hours after your procedure, you may feel tired. You may also be forgetful and have poor judgment.  Have a responsible adult stay with you for the time you are told. It is important to have someone help care for you until you are awake and alert.  Rest as told. Do not drive or operate machinery. Do not drink alcohol or take sleeping pills.  Get help right away if you have trouble breathing, or if you suddenly become confused. This information is not intended to replace advice given to you by your health care provider. Make sure you discuss any questions you have with your health care  provider. Document Revised: 11/27/2019 Document Reviewed: 02/13/2019 Elsevier Patient Education  2021 [employer].

## 2020-06-17 DIAGNOSIS — Z8521 Personal history of malignant neoplasm of larynx: Secondary | ICD-10-CM | POA: Diagnosis not present

## 2020-06-17 DIAGNOSIS — R1312 Dysphagia, oropharyngeal phase: Secondary | ICD-10-CM | POA: Diagnosis not present

## 2020-06-18 ENCOUNTER — Other Ambulatory Visit: Payer: Self-pay

## 2020-06-18 ENCOUNTER — Other Ambulatory Visit (HOSPITAL_COMMUNITY)
Admission: RE | Admit: 2020-06-18 | Discharge: 2020-06-18 | Disposition: A | Discharge status: Home / Self Care | Payer: Medicare Other | Source: Ambulatory Visit | Attending: Internal Medicine | Admitting: Internal Medicine

## 2020-06-18 ENCOUNTER — Encounter (HOSPITAL_COMMUNITY): Payer: Self-pay

## 2020-06-18 ENCOUNTER — Encounter (HOSPITAL_COMMUNITY)
Admission: RE | Admit: 2020-06-18 | Discharge: 2020-06-18 | Disposition: A | Discharge status: Home / Self Care | Payer: Medicare Other | Source: Ambulatory Visit | Attending: Internal Medicine | Admitting: Internal Medicine

## 2020-06-18 DIAGNOSIS — Z01818 Encounter for other preprocedural examination: Secondary | ICD-10-CM | POA: Diagnosis not present

## 2020-06-18 DIAGNOSIS — Z20822 Contact with and (suspected) exposure to covid-19: Secondary | ICD-10-CM | POA: Insufficient documentation

## 2020-06-18 LAB — SARS CORONAVIRUS 2 (TAT 6-24 HRS): SARS Coronavirus 2: NEGATIVE

## 2020-06-22 ENCOUNTER — Other Ambulatory Visit: Payer: Self-pay | Admitting: Family

## 2020-06-22 ENCOUNTER — Other Ambulatory Visit: Payer: Self-pay

## 2020-06-22 ENCOUNTER — Encounter (HOSPITAL_COMMUNITY): Payer: Self-pay

## 2020-06-22 ENCOUNTER — Ambulatory Visit (HOSPITAL_COMMUNITY)
Admission: RE | Admit: 2020-06-22 | Discharge: 2020-06-22 | Disposition: A | Discharge status: Home / Self Care | Payer: Medicare Other | Attending: Internal Medicine | Admitting: Internal Medicine

## 2020-06-22 ENCOUNTER — Ambulatory Visit (HOSPITAL_COMMUNITY): Payer: Medicare Other | Admitting: Anesthesiology

## 2020-06-22 ENCOUNTER — Encounter (HOSPITAL_COMMUNITY)
Admission: RE | Disposition: A | Discharge status: Home / Self Care | Payer: Self-pay | Source: Home / Self Care | Attending: Internal Medicine

## 2020-06-22 DIAGNOSIS — Z79899 Other long term (current) drug therapy: Secondary | ICD-10-CM | POA: Insufficient documentation

## 2020-06-22 DIAGNOSIS — Z86010 Personal history of colonic polyps: Secondary | ICD-10-CM

## 2020-06-22 DIAGNOSIS — Z791 Long term (current) use of non-steroidal anti-inflammatories (NSAID): Secondary | ICD-10-CM | POA: Insufficient documentation

## 2020-06-22 DIAGNOSIS — E785 Hyperlipidemia, unspecified: Secondary | ICD-10-CM

## 2020-06-22 DIAGNOSIS — K219 Gastro-esophageal reflux disease without esophagitis: Secondary | ICD-10-CM | POA: Insufficient documentation

## 2020-06-22 DIAGNOSIS — Z8719 Personal history of other diseases of the digestive system: Secondary | ICD-10-CM | POA: Insufficient documentation

## 2020-06-22 DIAGNOSIS — K648 Other hemorrhoids: Secondary | ICD-10-CM | POA: Insufficient documentation

## 2020-06-22 DIAGNOSIS — K6389 Other specified diseases of intestine: Secondary | ICD-10-CM

## 2020-06-22 DIAGNOSIS — I4891 Unspecified atrial fibrillation: Secondary | ICD-10-CM | POA: Diagnosis not present

## 2020-06-22 DIAGNOSIS — I10 Essential (primary) hypertension: Secondary | ICD-10-CM | POA: Diagnosis not present

## 2020-06-22 DIAGNOSIS — Z87891 Personal history of nicotine dependence: Secondary | ICD-10-CM | POA: Diagnosis not present

## 2020-06-22 DIAGNOSIS — Z1211 Encounter for screening for malignant neoplasm of colon: Secondary | ICD-10-CM | POA: Insufficient documentation

## 2020-06-22 DIAGNOSIS — Z8249 Family history of ischemic heart disease and other diseases of the circulatory system: Secondary | ICD-10-CM | POA: Insufficient documentation

## 2020-06-22 DIAGNOSIS — K529 Noninfective gastroenteritis and colitis, unspecified: Secondary | ICD-10-CM | POA: Diagnosis not present

## 2020-06-22 HISTORY — PX: COLONOSCOPY WITH PROPOFOL: SHX5780

## 2020-06-22 HISTORY — PX: BIOPSY: SHX5522

## 2020-06-22 SURGERY — COLONOSCOPY WITH PROPOFOL
Anesthesia: General

## 2020-06-22 MED ORDER — LACTATED RINGERS IV SOLN: INTRAVENOUS | Status: DC

## 2020-06-22 MED ORDER — PROPOFOL 10 MG/ML IV BOLUS
INTRAVENOUS | Status: DC | PRN
  Administered 2020-06-22: 100 mg via INTRAVENOUS
  Administered 2020-06-22 (×2): 40 mg via INTRAVENOUS

## 2020-06-22 MED ORDER — LIDOCAINE HCL (CARDIAC) PF 100 MG/5ML IV SOSY
PREFILLED_SYRINGE | INTRAVENOUS | Status: DC | PRN
  Administered 2020-06-22: 60 mg via INTRAVENOUS

## 2020-06-22 NOTE — Transfer of Care (Signed)
Immediate Anesthesia Transfer of Care Note  Patient: Logan Price  Procedure(s) Performed: COLONOSCOPY WITH PROPOFOL (N/A ) BIOPSY  Patient Location: PACU  Anesthesia Type:General  Level of Consciousness: awake  Airway & Oxygen Therapy: Patient Spontanous Breathing  Post-op Assessment: Report given to RN and Post -op Vital signs reviewed and stable  Post vital signs: Reviewed and stable  Last Vitals:  Vitals Value Taken Time  BP 105/66 06/22/20 1337  Temp    Pulse 79 06/22/20 1338  Resp 29 06/22/20 1338  SpO2 100 % 06/22/20 1338  Vitals shown include unvalidated device data.  Last Pain:  Vitals:   06/22/20 1247  TempSrc: Oral  PainSc: 5          Complications: No complications documented.

## 2020-06-22 NOTE — Anesthesia Preprocedure Evaluation (Signed)
Anesthesia Evaluation  Patient identified by MRN, date of birth, ID band Patient awake    Reviewed: Allergy & Precautions, NPO status , Patient's Chart, lab work & pertinent test results  History of Anesthesia Complications Negative for: history of anesthetic complications  Airway Mallampati: II  TM Distance: >3 FB Neck ROM: Full    Dental  (+) Dental Advisory Given, Missing   Pulmonary shortness of breath, former smoker,  Laryngeal cancer, lung cancer   Pulmonary exam normal breath sounds clear to auscultation       Cardiovascular Exercise Tolerance: Good hypertension, Pt. on medications Normal cardiovascular exam+ dysrhythmias Atrial Fibrillation and Supra Ventricular Tachycardia  Rhythm:Regular Rate:Normal     Neuro/Psych negative psych ROS   GI/Hepatic GERD  Medicated and Controlled,  Endo/Other    Renal/GU      Musculoskeletal  (+) Arthritis  (back pain),   Abdominal   Peds  Hematology  (+) anemia ,   Anesthesia Other Findings Laryngeal cancer, lung cancer, s/p radiation 2020  Reproductive/Obstetrics                             Anesthesia Physical Anesthesia Plan  ASA: III  Anesthesia Plan: General   Post-op Pain Management:    Induction: Intravenous  PONV Risk Score and Plan: Propofol infusion  Airway Management Planned: Nasal Cannula and Natural Airway  Additional Equipment:   Intra-op Plan:   Post-operative Plan:   Informed Consent: I have reviewed the patients History and Physical, chart, labs and discussed the procedure including the risks, benefits and alternatives for the proposed anesthesia with the patient or authorized representative who has indicated his/her understanding and acceptance.       Plan Discussed with: CRNA and Surgeon  Anesthesia Plan Comments:         Anesthesia Quick Evaluation

## 2020-06-22 NOTE — Op Note (Signed)
[facility] Patient Name: Logan Price Procedure Date: 06/22/2020 1:07 PM MRN: [id number] Date of Birth: March 06, 1946 Attending MD: Hennie Duos. Marletta Lor DO CSN: [id number] Age: 75 Admit Type: Outpatient Procedure:                Colonoscopy Indications:              High risk colon cancer surveillance: Personal                            history of colonic polyps in 2017. Providers:                Hennie Duos. Marletta Lor, DO, Jannett Celestine, RN, Dyann Ruddle Referring MD:              Medicines:                See the Anesthesia note for documentation of the                            administered medications Complications:            No immediate complications. Estimated Blood Loss:     Estimated blood loss was minimal. Procedure:                Pre-Anesthesia Assessment:                           - The anesthesia plan was to use monitored                            anesthesia care (MAC).                           After obtaining informed consent, the colonoscope                            was passed under direct vision. Throughout the                            procedure, the patient's blood pressure, pulse, and                            oxygen saturations were monitored continuously. The                            [id number]-[id number] ([id number]) scope was introduced through                            the anus and advanced to the the terminal ileum,                            with identification of the appendiceal orifice and                            IC valve. The colonoscopy was performed without  difficulty. The patient tolerated the procedure                            well. The quality of the bowel preparation was                            evaluated using the BBPS [facility] Bowel Preparation                            Scale) with scores of: Right Colon = 3, Transverse                            Colon = 3 and Left Colon = 3 (entire mucosa seen                             well with no residual staining, small fragments of                            stool or opaque liquid). The total BBPS score                            equals 9. Scope In: 1:22:26 PM Scope Out: 1:31:13 PM Scope Withdrawal Time: 0 hours 7 minutes 17 seconds  Total Procedure Duration: 0 hours 8 minutes 47 seconds  Findings:      The perianal and digital rectal examinations were normal.      Non-bleeding internal hemorrhoids were found during endoscopy.      A localized area of moderately nodular mucosa was found at the ileocecal       valve. Biopsies were taken with a cold forceps for histology. Impression:               - Non-bleeding internal hemorrhoids.                           - Nodular mucosa at the ileocecal valve. Biopsied. Moderate Sedation:      Per Anesthesia Care Recommendation:           - Patient has a contact number available for                            emergencies. The signs and symptoms of potential                            delayed complications were discussed with the                            patient. Return to normal activities tomorrow.                            Written discharge instructions were provided to the                            patient.                           -  Resume previous diet.                           - Continue present medications.                           - Await pathology results.                           - Repeat colonoscopy date to be determined after                            pending pathology results are reviewed for                            surveillance based on pathology results.                           - Return to GI clinic PRN. Procedure Code(s):        --- Professional ---                           [id number], Colonoscopy, flexible; with biopsy, single                            or multiple Diagnosis Code(s):        --- Professional ---                           Z86.010, Personal history of colonic polyps                            K63.89, Other specified diseases of intestine                           K64.8, Other hemorrhoids CPT copyright 2019 American Medical Association. All rights reserved. The codes documented in this report are preliminary and upon coder review may  be revised to meet current compliance requirements. Hennie Duos. Marletta Lor, DO Hennie Duos. Marletta Lor, DO 06/22/2020 1:43:42 PM This report has been signed electronically. Number of Addenda: 0

## 2020-06-22 NOTE — Discharge Instructions (Addendum)
Colonoscopy Discharge Instructions  Read the instructions outlined below and refer to this sheet in the next few weeks. These discharge instructions provide you with general information on caring for yourself after you leave the hospital. Your doctor may also give you specific instructions. While your treatment has been planned according to the most current medical practices available, unavoidable complications occasionally occur.   ACTIVITY  You may resume your regular activity, but move at a slower pace for the next 24 hours.   Take frequent rest periods for the next 24 hours.   Walking will help get rid of the air and reduce the bloated feeling in your belly (abdomen).   No driving for 24 hours (because of the medicine (anesthesia) used during the test).    Do not sign any important legal documents or operate any machinery for 24 hours (because of the anesthesia used during the test).  NUTRITION  Drink plenty of fluids.   You may resume your normal diet as instructed by your doctor.   Begin with a light meal and progress to your normal diet. Heavy or fried foods are harder to digest and may make you feel sick to your stomach (nauseated).   Avoid alcoholic beverages for 24 hours or as instructed.  MEDICATIONS  You may resume your normal medications unless your doctor tells you otherwise.  WHAT YOU CAN EXPECT TODAY  Some feelings of bloating in the abdomen.   Passage of more gas than usual.   Spotting of blood in your stool or on the toilet paper.  IF YOU HAD POLYPS REMOVED DURING THE COLONOSCOPY:  No aspirin products for 7 days or as instructed.   No alcohol for 7 days or as instructed.   Eat a soft diet for the next 24 hours.  FINDING OUT THE RESULTS OF YOUR TEST Not all test results are available during your visit. If your test results are not back during the visit, make an appointment with your caregiver to find out the results. Do not assume everything is normal if  you have not heard from your caregiver or the medical facility. It is important for you to follow up on all of your test results.  SEEK IMMEDIATE MEDICAL ATTENTION IF:  You have more than a spotting of blood in your stool.   Your belly is swollen (abdominal distention).   You are nauseated or vomiting.   You have a temperature over 101.   You have abdominal pain or discomfort that is severe or gets worse throughout the day.   Your colonoscopy was relatively unremarkable.  I did not find any polyps or evidence of colon cancer.  The valve that connects your small bowel to your colon did appear somewhat nodular.  This can be indicative of polyp tissue.  I took multiple biopsies of this.  Await pathology results, my office will contact you.  Further recommendations to follow.  I hope you have a great rest of your week!  Elon Alas. [name], D.O. Gastroenterology and Hepatology [facility]  Monitored Anesthesia Care, Care After This sheet gives you information about how to care for yourself after your procedure. Your health care provider may also give you more specific instructions. If you have problems or questions, contact your health care provider. What can I expect after the procedure? After the procedure, it is common to have:  Tiredness.  Forgetfulness about what happened after the procedure.  Impaired judgment for important decisions.  Nausea or vomiting.  Some difficulty  with balance. Follow these instructions at home: For the time period you were told by your health care provider:  Rest as needed.  Do not participate in activities where you could fall or become injured.  Do not drive or use machinery.  Do not drink alcohol.  Do not take sleeping pills or medicines that cause drowsiness.  Do not make important decisions or sign legal documents.  Do not take care of children on your own.      Eating and drinking  Follow the diet that  is recommended by your health care provider.  Drink enough fluid to keep your urine pale yellow.  If you vomit: ? Drink water, juice, or soup when you can drink without vomiting. ? Make sure you have little or no nausea before eating solid foods. General instructions  Have a responsible adult stay with you for the time you are told. It is important to have someone help care for you until you are awake and alert.  Take over-the-counter and prescription medicines only as told by your health care provider.  If you have sleep apnea, surgery and certain medicines can increase your risk for breathing problems. Follow instructions from your health care provider about wearing your sleep device: ? Anytime you are sleeping, including during daytime naps. ? While taking prescription pain medicines, sleeping medicines, or medicines that make you drowsy.  Avoid smoking.  Keep all follow-up visits as told by your health care provider. This is important. Contact a health care provider if:  You keep feeling nauseous or you keep vomiting.  You feel light-headed.  You are still sleepy or having trouble with balance after 24 hours.  You develop a rash.  You have a fever.  You have redness or swelling around the IV site. Get help right away if:  You have trouble breathing.  You have new-onset confusion at home. Summary  For several hours after your procedure, you may feel tired. You may also be forgetful and have poor judgment.  Have a responsible adult stay with you for the time you are told. It is important to have someone help care for you until you are awake and alert.  Rest as told. Do not drive or operate machinery. Do not drink alcohol or take sleeping pills.  Get help right away if you have trouble breathing, or if you suddenly become confused. This information is not intended to replace advice given to you by your health care provider. Make sure you discuss any questions you have  with your health care provider. Document Revised: 11/27/2019 Document Reviewed: 02/13/2019 Elsevier Patient Education  2021 [employer].

## 2020-06-22 NOTE — Anesthesia Procedure Notes (Signed)
Date/Time: 06/22/2020 1:25 PM Performed by: Orlie Dakin, CRNA Pre-anesthesia Checklist: Patient identified, Emergency Drugs available, Suction available and Patient being monitored Patient Re-evaluated:Patient Re-evaluated prior to induction Oxygen Delivery Method: Nasal cannula Induction Type: IV induction Placement Confirmation: positive ETCO2

## 2020-06-22 NOTE — Interval H&P Note (Signed)
History and Physical Interval Note:  06/22/2020 12:35 PM  Logan Price  has presented today for surgery, with the diagnosis of history of polyps.  The various methods of treatment have been discussed with the patient and family. After consideration of risks, benefits and other options for treatment, the patient has consented to  Procedure(s) with comments: COLONOSCOPY WITH PROPOFOL (N/A) - PM as a surgical intervention.  The patient's history has been reviewed, patient examined, no change in status, stable for surgery.  I have reviewed the patient's chart and labs.  Questions were answered to the patient's satisfaction.     Eloise Harman

## 2020-06-22 NOTE — Anesthesia Postprocedure Evaluation (Signed)
Anesthesia Post Note  Patient: Logan Price  Procedure(s) Performed: COLONOSCOPY WITH PROPOFOL (N/A ) BIOPSY  Patient location during evaluation: Phase II Anesthesia Type: General Level of consciousness: awake and alert and oriented Pain management: pain level controlled Vital Signs Assessment: post-procedure vital signs reviewed and stable Respiratory status: spontaneous breathing and respiratory function stable Cardiovascular status: blood pressure returned to baseline Postop Assessment: no apparent nausea or vomiting Anesthetic complications: no   No complications documented.   Last Vitals:  Vitals:   06/22/20 1345 06/22/20 1405  BP: 134/82 (!) 142/94  Pulse: 80 81  Resp: (!) 23 19  Temp:  36.6 C  SpO2: 99% 98%    Last Pain:  Vitals:   06/22/20 1405  TempSrc: Oral  PainSc: 0-No pain                 Minnette Merida C Vira Chaplin

## 2020-06-23 LAB — SURGICAL PATHOLOGY

## 2020-06-24 ENCOUNTER — Other Ambulatory Visit (INDEPENDENT_AMBULATORY_CARE_PROVIDER_SITE_OTHER): Payer: Self-pay

## 2020-06-24 DIAGNOSIS — K219 Gastro-esophageal reflux disease without esophagitis: Secondary | ICD-10-CM

## 2020-06-24 MED ORDER — PANTOPRAZOLE SODIUM 40 MG PO TBEC: DELAYED_RELEASE_TABLET | ORAL | 2 refills | Status: DC

## 2020-06-28 ENCOUNTER — Encounter (HOSPITAL_COMMUNITY): Payer: Self-pay | Admitting: Internal Medicine

## 2020-07-05 ENCOUNTER — Telehealth: Payer: Self-pay | Admitting: *Deleted

## 2020-07-05 NOTE — Telephone Encounter (Signed)
CALLED PATIENT TO INFORM OF CT FOR 07-19-20 - ARRIVALTIME- 1:15 PM @ [facility] (NO RESTRICTIONS TO TEST), AND PATIENT TO COME IN FOR RESULTS ON 07-20-20 WITH DR. Isidore Moos- LAB AT 2 PM AND THEN FU @ 2:20 PM, SPOKE WITH PATIENT AND HE IS AWARE OF THESE APPTS.

## 2020-07-08 ENCOUNTER — Other Ambulatory Visit: Payer: Self-pay

## 2020-07-08 ENCOUNTER — Ambulatory Visit (INDEPENDENT_AMBULATORY_CARE_PROVIDER_SITE_OTHER): Payer: Medicare Other

## 2020-07-08 DIAGNOSIS — Z87891 Personal history of nicotine dependence: Secondary | ICD-10-CM

## 2020-07-08 DIAGNOSIS — Z136 Encounter for screening for cardiovascular disorders: Secondary | ICD-10-CM | POA: Diagnosis not present

## 2020-07-12 ENCOUNTER — Ambulatory Visit (INDEPENDENT_AMBULATORY_CARE_PROVIDER_SITE_OTHER): Payer: Medicare Other | Admitting: Licensed Clinical Social Worker

## 2020-07-12 DIAGNOSIS — I10 Essential (primary) hypertension: Secondary | ICD-10-CM

## 2020-07-12 DIAGNOSIS — E559 Vitamin D deficiency, unspecified: Secondary | ICD-10-CM

## 2020-07-12 DIAGNOSIS — E785 Hyperlipidemia, unspecified: Secondary | ICD-10-CM | POA: Diagnosis not present

## 2020-07-12 DIAGNOSIS — K219 Gastro-esophageal reflux disease without esophagitis: Secondary | ICD-10-CM

## 2020-07-12 NOTE — Chronic Care Management (AMB) (Signed)
Chronic Care Management    Clinical Social Work Note  07/12/2020 Name: Logan Price MRN: [id number] DOB: Mar 22, 1946  Logan Price is a 75 y.o. year old male who is a primary care patient of [name], FNP. The CCM team was consulted to assist the patient with chronic disease management and/or care coordination needs related to: [name] .   Engaged with patient by telephone for follow up visit in response to provider referral for social work chronic care management and care coordination services.   Consent to Services:  The patient was given information about Chronic Care Management services, agreed to services, and gave verbal consent prior to initiation of services.  Please see initial visit note for detailed documentation.   Patient agreed to services and consent obtained.   Assessment: Review of patient past medical history, allergies, medications, and health status, including review of relevant consultants reports was performed today as part of a comprehensive evaluation and provision of chronic care management and care coordination services.     SDOH (Social Determinants of Health) assessments and interventions performed:  SDOH Interventions   Flowsheet Row Most Recent Value  SDOH Interventions   Depression Interventions/Treatment  --  [informed client or LCSW support and RNCM support]       Advanced Directives Status: See Vyn[employer] application for related entries.  CCM Care Plan  No Known Allergies  Outpatient Encounter Medications as of 07/12/2020  Medication Sig  . diclofenac (VOLTAREN) 75 MG EC tablet TAKE 1 TABLET BY MOUTH TWICE A DAY (Patient taking differently: Take 75 mg by mouth 2 (two) times daily.)  . doxazosin (CARDURA) 1 MG tablet Place 1 tablet (1 mg total) into feeding tube daily. (Patient taking differently: Take 1 mg by mouth daily.)  . pantoprazole (PROTONIX) 40 MG tablet TAKE ONE TABLET DAILY 30 MINUTES PRIOR TO THE FIRST MEAL  .  polyethylene glycol-electrolytes (TRILYTE) 420 g solution Take 4,000 mLs by mouth as directed.  . simvastatin (ZOCOR) 20 MG tablet TAKE 1 TABLET DAILY AT 6PM   No facility-administered encounter medications on file as of 07/12/2020.    Patient Active Problem List   Diagnosis Date Noted  . Low back pain 03/10/2020  . Malignant neoplasm of right upper lobe of lung (HCC) 05/27/2019  . Primary cancer of left upper lobe of lung (HCC) 05/27/2019  . Pseudomonas aeruginosa infection 05/21/2019  . Tracheobronchitis 05/21/2019  . Hx of radiation therapy 05/21/2019  . 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HCC) 05/21/2019  . Squamous cell lung cancer, right (HCC) 05/21/2019  . Adenocarcinoma, lung, left (HCC) 05/21/2019  . Lung nodule 04/23/2019  . Ground glass opacity present on imaging of lung 04/23/2019  . Head and neck cancer (HCC) 04/23/2019  . 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 Pleural effusion   . 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HCC)   . Bacteremia   . Leukocytosis   . Normocytic anemia   . PSVT (paroxysmal supraventricular tachycardia) (HCC) 10/03/2018  . Atrial fibrillation (HCC) 10/03/2018  . Goals of care, counseling/discussion   . Dysphagia   . Loss of weight   . 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 Gastritis determined by endoscopy   . Esophageal dysphagia   . 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 Globus sensation 12/12/2016  . Smokes with greater than 40 pack year history 10/20/2016  . Hemorrhoids, internal, with bleeding 10/06/2015  . History of colonic polyps 04/14/2015  . Vitamin D deficiency 10/09/2014  . HLD (  hyperlipidemia) 10/08/2012  . GERD (gastroesophageal reflux disease) 10/08/2012  . HTN (hypertension)   . Diverticulitis 10/25/2009    Conditions to be addressed/monitored: ; Monitor client management of mobility, pain issues, and ADLs completion daily  Care Plan : LCSW care plan  Updates made by Isaiah Blakes, LCSW since  07/12/2020 12:00 AM    Problem: Coping Skills (General Plan of Care)     Goal: Manage mobility issues, manage pain issues and work to complete ADLs daily   Start Date: 06/10/2020  Expected End Date: 09/10/2020  This Visit's Progress: On track  Recent Progress: On track  Priority: High  Note:   Current barriers:   . Patient in need of assistance with connecting to community resources for helping client find needed resources to help him with managing mobility and ADLs completion . Patient is unable to independently navigate community resource options without care coordination support . Adenocarcinoma of Left Lung . Pain issues  Clinical Goals:  Patient will communicate with LCSW in next 30 days to discuss community resources of possible help to client Patient to communicate with LCSW in next 30 days to discuss client mobility and pain issues of client Patient to communicate with RNCM  in next 30 days as needed to discuss nursing needs of client  Clinical Interventions:  . Collaboration with [name], FNP regarding development and update of comprehensive plan of care as evidenced by provider attestation and co-signature . Assessment of needs, barriers  of client  . Talked with client about pain issues (client spoke of pain in his back) . Talked with client about mobility (he uses a cane as needed to walk) . Talked with client about appetite of client . Talked with client about sleeping issues of client . Talked with client about medication procurement . Talked with client about client completion of ADLs . Talked with client about upcoming client medical appointments . Talked with client about meal provision of client . Talked with client about relaxation techniques (watches TV to help him relax) . Encouraged client to call RNCM as needed for nursing support  Patient Strengths:  Attends scheduled medical appointments Takes medications as prescribed Has support from  spouse Drives to needed appointments  Patient Deficits:  Pain issues and mobility issues  Patient Goals:   To attend scheduled medical appointments To take medications as prescribed To communicate regularly with his spouse to discuss client needs To communicate with RNCM or LCSW as needed for CCM support To participate in physical therapy sessions as scheduled -  Follow Up Plan: LCSW to call client or spouse of client on 08/17/20 to assess client needs      Kelton Pillar.Barney Gertsch MSW, LCSW Licensed Clinical Social Worker [facility] Care Management [phone]

## 2020-07-12 NOTE — Patient Instructions (Signed)
Visit Information  PATIENT GOALS: Goals Addressed            This Visit's Progress   . Protect My Health ; patient will work to improve mobility, manage pain issus and complete ADLs daily       Timeframe:  Short-Term Goal Priority:  Medium Start Date:      06/10/20                       Expected End Date:     09/10/20                  Follow Up Date 08/17/20    Protect My Health (Patient) Patient will work to improve mobility, manage pain issues and complete ADLs daily    Why is this important?    Screening tests can find diseases early when they are easier to treat.   Your doctor or nurse will talk with you about which tests are important for you.   Getting shots for common diseases like the flu and shingles will help prevent them.      Patient Strengths:  Attends scheduled medical appointments Takes medications as prescribed Has support from spouse Drives to needed appointments  Patient Deficits:  Pain issues and mobility issues  Patient Goals:   To attend scheduled medical appointments To take medications as prescribed To communicate regularly with his spouse to discuss client needs To communicate with RNCM or LCSW as needed for CCM support To participate in physical therapy sessions as scheduled -  Follow Up Plan: LCSW to call client or spouse of client on 08/17/20 to assess client needs       Norva Riffle.Williams Dietrick MSW, LCSW Licensed Clinical Social Worker [facility] Care Management [phone]

## 2020-07-19 ENCOUNTER — Encounter (HOSPITAL_COMMUNITY): Payer: Self-pay

## 2020-07-19 ENCOUNTER — Other Ambulatory Visit: Payer: Self-pay

## 2020-07-19 ENCOUNTER — Ambulatory Visit (HOSPITAL_COMMUNITY)
Admission: RE | Admit: 2020-07-19 | Discharge: 2020-07-19 | Disposition: A | Discharge status: Home / Self Care | Payer: Medicare Other | Source: Ambulatory Visit | Attending: Radiation Oncology | Admitting: Radiation Oncology

## 2020-07-19 DIAGNOSIS — C3412 Malignant neoplasm of upper lobe, left bronchus or lung: Secondary | ICD-10-CM | POA: Insufficient documentation

## 2020-07-19 DIAGNOSIS — I700 Atherosclerosis of aorta: Secondary | ICD-10-CM | POA: Diagnosis not present

## 2020-07-19 DIAGNOSIS — C3411 Malignant neoplasm of upper lobe, right bronchus or lung: Secondary | ICD-10-CM | POA: Diagnosis not present

## 2020-07-19 DIAGNOSIS — J439 Emphysema, unspecified: Secondary | ICD-10-CM | POA: Diagnosis not present

## 2020-07-19 DIAGNOSIS — C3490 Malignant neoplasm of unspecified part of unspecified bronchus or lung: Secondary | ICD-10-CM | POA: Diagnosis not present

## 2020-07-19 DIAGNOSIS — I2510 Atherosclerotic heart disease of native coronary artery without angina pectoris: Secondary | ICD-10-CM | POA: Diagnosis not present

## 2020-07-20 ENCOUNTER — Telehealth: Payer: Self-pay | Admitting: *Deleted

## 2020-07-20 ENCOUNTER — Ambulatory Visit
Admission: RE | Admit: 2020-07-20 | Discharge: 2020-07-20 | Disposition: A | Discharge status: Home / Self Care | Payer: Medicare Other | Source: Ambulatory Visit | Attending: Radiation Oncology | Admitting: Radiation Oncology

## 2020-07-20 ENCOUNTER — Other Ambulatory Visit: Payer: Self-pay

## 2020-07-20 VITALS — BP 138/79 | HR 81 | Temp 98.4°F | Resp 20 | Ht 69.0 in | Wt 164.0 lb

## 2020-07-20 DIAGNOSIS — Z79899 Other long term (current) drug therapy: Secondary | ICD-10-CM | POA: Diagnosis not present

## 2020-07-20 DIAGNOSIS — Z8521 Personal history of malignant neoplasm of larynx: Secondary | ICD-10-CM | POA: Insufficient documentation

## 2020-07-20 DIAGNOSIS — C3411 Malignant neoplasm of upper lobe, right bronchus or lung: Secondary | ICD-10-CM | POA: Diagnosis not present

## 2020-07-20 DIAGNOSIS — C3491 Malignant neoplasm of unspecified part of right bronchus or lung: Secondary | ICD-10-CM

## 2020-07-20 DIAGNOSIS — C3412 Malignant neoplasm of upper lobe, left bronchus or lung: Secondary | ICD-10-CM | POA: Diagnosis not present

## 2020-07-20 DIAGNOSIS — C320 Malignant neoplasm of glottis: Secondary | ICD-10-CM

## 2020-07-20 DIAGNOSIS — Z08 Encounter for follow-up examination after completed treatment for malignant neoplasm: Secondary | ICD-10-CM | POA: Diagnosis not present

## 2020-07-20 DIAGNOSIS — C3492 Malignant neoplasm of unspecified part of left bronchus or lung: Secondary | ICD-10-CM | POA: Diagnosis not present

## 2020-07-20 DIAGNOSIS — Z1329 Encounter for screening for other suspected endocrine disorder: Secondary | ICD-10-CM

## 2020-07-20 LAB — TSH: TSH: 3.472 u[IU]/mL (ref 0.320–4.118)

## 2020-07-20 NOTE — Progress Notes (Signed)
Mr. Douville returns for follow-up of radiation to the larynx completed on 12/10/2018 and SBRT to the right and left lungs on 06/13/2019, and to receive CT scan results from 07/19/2020  Pain issues, if any: Patient denies any neck or throat pain. Does report ongoing lower back pain (5 out of 10) Using a feeding tube?: N/A Weight changes, if any: Wt Readings from Last 3 Encounters:  07/20/20 164 lb (74.4 kg)  06/18/20 166 lb (75.3 kg)  05/27/20 170 lb (77.1 kg)   Swallowing issues, if any: Patient denies any issues. Reports he can eat a wide variety of food and beverages, and has a stable appetite Smoking or chewing tobacco? None SOB: Denies any changes or worsening of symptoms Cough: Denies a dry or productive cough Using fluoride trays daily? N/A--does see a community dentist regularly Last ENT visit was on: Reports he saw Dr. Leta Baptist this past March, and will follow-up with him in September  Other notable issues, if any: Denies any issues with ear or jaw pain, or difficulty opening his mouth fully. Continues to deal with dry mouth and thick saliva. Reports occasional symptoms of swelling/lymphedema to his neck. Denies fatigue or difficulty sleeping.   Vitals:   07/20/20 1438  BP: 138/79  Pulse: 81  Resp: 20  Temp: 98.4 F (36.9 C)  SpO2: 100%

## 2020-07-20 NOTE — Telephone Encounter (Signed)
Called patient to remind of lab for 07-20-20 @ 2 pm, spoke with patient and he is aware of this appt.

## 2020-07-21 ENCOUNTER — Encounter: Payer: Self-pay | Admitting: Radiation Oncology

## 2020-07-21 DIAGNOSIS — Z23 Encounter for immunization: Secondary | ICD-10-CM | POA: Diagnosis not present

## 2020-07-21 NOTE — Progress Notes (Signed)
Radiation Oncology         [phone] ________________________________  Name: MARTHA GUERRY MRN: [id number]  Date: 07/20/2020  DOB: [birth year]  Follow-Up Visit Note in person  CC: [name], FNP  Newman Pies, MD  Diagnosis and Prior Radiotherapy:       ICD-10-CM   1. 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 Malignant neoplasm of right upper lobe of lung (HCC)  C34.11   3. Primary cancer of left upper lobe of lung (HCC)  C34.12   4. Squamous cell lung cancer, right (HCC)  C34.91     Radiation Treatment Dates: 10/22/2018 through 12/10/2018 Site Technique Total Dose Dose per Fx Completed Fx Beam Energies  Head & neck: HN_larynx IMRT 70/70 2 35/35 6X   Radiation Treatment Dates: 06/03/2019 through 06/13/2019 Site Technique Total Dose (Gy) Dose per Fx (Gy) Completed Fx Beam Energies  Lung, Right: Lung_Rt_Upper IMRT 60/60 12 5/5 6XFFF  Lung, Left: Lung_Lt_Upper IMRT 60/60 12 5/5 6XFFF    CHIEF COMPLAINT:  Here for follow-up and surveillance of throat and lung cancer   Narrative:      Mr. Kump returns for follow-up of radiation to the larynx completed on 12/10/2018 and SBRT to the right and left lungs on 06/13/2019, and to receive CT scan results from 07/19/2020  Pain issues, if any: Patient denies any neck or throat pain. Does report ongoing lower back pain (5 out of 10) Using a feeding tube?: N/A Weight changes, if any: Wt Readings from Last 3 Encounters:  07/20/20 164 lb (74.4 kg)  06/18/20 166 lb (75.3 kg)  05/27/20 170 lb (77.1 kg)   Swallowing issues, if any: Patient denies any issues. Reports he can eat a wide variety of food and beverages, and has a stable appetite Smoking or chewing tobacco? None SOB: Denies any changes or worsening of symptoms Cough: Denies a dry or productive cough Using fluoride trays daily? N/A--does see a community dentist regularly Last ENT visit was on: Reports he saw Dr. Newman Pies this past March (NED per laryngoscopy note), and  will follow-up with him in September  Other notable issues, if any: Denies any issues with ear or jaw pain, or difficulty opening his mouth fully. Continues to deal with dry mouth and thick saliva. Reports occasional symptoms of swelling/lymphedema to his neck. Denies fatigue or difficulty sleeping.   Vitals:   07/20/20 1438  BP: 138/79  Pulse: 81  Resp: 20  Temp: 98.4 F (36.9 C)  SpO2: 100%     ALLERGIES:  has No Known Allergies.  Meds: Current Outpatient Medications  Medication Sig Dispense Refill  . doxazosin (CARDURA) 1 MG tablet Place 1 tablet (1 mg total) into feeding tube daily. (Patient taking differently: Take 1 mg by mouth daily.) 30 tablet 5  . pantoprazole (PROTONIX) 40 MG tablet TAKE ONE TABLET DAILY 30 MINUTES PRIOR TO THE FIRST MEAL 90 tablet 2  . simvastatin (ZOCOR) 20 MG tablet TAKE 1 TABLET DAILY AT 6PM 90 tablet 0   No current facility-administered medications for this encounter.    Physical Findings: The patient is in no acute distress. Patient is alert and oriented. Wt Readings from Last 3 Encounters:  07/20/20 164 lb (74.4 kg)  06/18/20 166 lb (75.3 kg)  05/27/20 170 lb (77.1 kg)    height is 5\' 9"  (1.753 m) and weight is 164 lb (74.4 kg). His temperature is 98.4 F (36.9 C). His blood pressure is 138/79 and his pulse  is 81. His respiration is 20 and oxygen saturation is 100%. .  General: Alert and oriented, in no acute distress.   HEENT: No lesions in the mouth or upper throat NECK: No palpable masses in the cervical or supraclavicular neck Heart regular in rate and rhythm Chest clear to auscultation bilaterally  Lab Findings: Lab Results  Component Value Date   WBC 4.1 04/08/2020   HGB 13.4 04/08/2020   HCT 40.1 04/08/2020   MCV 93 04/08/2020   PLT 183 04/08/2020    Lab Results  Component Value Date   TSH 3.472 07/20/2020    Radiographic Findings: I personally reviewed the CT scan of the chest CT Chest Wo Contrast  Result Date:  07/19/2020 CLINICAL DATA:  Non-small cell lung cancer and history of head and neck cancer. History of radiotherapy. EXAM: CT CHEST WITHOUT CONTRAST TECHNIQUE: Multidetector CT imaging of the chest was performed following the standard protocol without IV contrast. COMPARISON:  March 25, 2020 FINDINGS: Cardiovascular: Aortic caliber is stable approximately 3.7 cm of the ascending thoracic aorta. Calcified atheromatous plaque of the thoracic aorta. Three-vessel coronary artery disease. No pericardial effusion. Normal caliber of central pulmonary vessels. Limited assessment of cardiovascular structures given lack of intravenous contrast. Mediastinum/Nodes: No axillary, thoracic inlet, mediastinal or gross hilar lymphadenopathy. Esophagus mildly patulous. Lungs/Pleura: Nodular area amidst post treatment changes in the RIGHT middle lobe along the minor fissure measuring 1.3 x 0.9 cm approximate axial dimension previously 1.3 x 1.3 cm, associated with bandlike post treatment changes that extend to the periphery. Signs of prior granulomatous disease, biapical scarring related to prior radiation and background pulmonary emphysema as before. Post treatment changes also present in the LEFT chest with area of nodularity in the lingula on image 101 of series 7 measuring 11 x 10 mm, within 1 mm of previous size. Airways are patent.  No effusion. Upper Abdomen: Incidental imaging of upper abdominal contents without acute process. Signs of prior G-tube. Musculoskeletal: Spinal degenerative changes no acute or destructive bone finding. IMPRESSION: 1. Stable appearance of post treatment changes in the chest in the lingula and along the minor fissure with fibrosis in the medial upper chest related to prior radiation in the neck. 2. Three-vessel coronary artery disease. 3. Emphysema and aortic atherosclerosis. Aortic Atherosclerosis (ICD10-I70.0) and Emphysema (ICD10-J43.9). Electronically Signed   By: Donzetta Kohut M.D.   On:  07/19/2020 18:33   VAS US AORTA MEDICARE SCREEN  Result Date: 07/09/2020 ABDOMINAL AORTA STUDY Indications: Brother had a brain aneurysm. Risk Factors: Hypertension, hyperlipidemia, past history of smoking. Vascular Interventions: None.  Comparison Study: CT Abdomen in 09/2018 showed eExtensive atherosclerotic                   vascular calcifications. No                   aneurysm. Performing Technologist: [employer] BS, RVT, RDCS  Examination Guidelines: A complete evaluation includes B-mode imaging, spectral Doppler, color Doppler, and power Doppler as needed of all accessible portions of each vessel. Bilateral testing is considered an integral part of a complete examination. Limited examinations for reoccurring indications may be performed as noted.  Abdominal Aorta Findings: +-----------+-------+----------+----------+--------+--------+--------+ Location   AP (cm)Trans (cm)PSV (cm/s)WaveformThrombusComments +-----------+-------+----------+----------+--------+--------+--------+ Proximal   1.60   1.70      118       biphasic                 +-----------+-------+----------+----------+--------+--------+--------+ Mid  1.40   1.40      70        biphasic                 +-----------+-------+----------+----------+--------+--------+--------+ Distal     1.40   1.40      56        biphasic                 +-----------+-------+----------+----------+--------+--------+--------+ RT CIA Prox0.9    0.9       103       biphasic                 +-----------+-------+----------+----------+--------+--------+--------+ RT EIA Prox0.5    0.5       112       biphasic                 +-----------+-------+----------+----------+--------+--------+--------+ LT CIA Prox0.9    1.0       130       biphasic                 +-----------+-------+----------+----------+--------+--------+--------+ LT EIA Prox0.7    0.6       104       biphasic                  +-----------+-------+----------+----------+--------+--------+--------+ Shadowing medial calcification in the distal aorta and medial calcification in the iliac arteries. IVC/Iliac Findings: +--------+------+--------+--------+   IVC   PatentThrombusComments +--------+------+--------+--------+ IVC Proxpatent                 +--------+------+--------+--------+ IVC Mid patent                 +--------+------+--------+--------+    Summary: Abdominal Aorta: No evidence of an abdominal aortic aneurysm was visualized. Stenosis: Medial calcification in the distal aorta and iliac arteries.  *See table(s) above for measurements and observations.  Electronically signed by Dina Rich MD on 07/09/2020 at 10:49:57 AM.    Final     Impression/Plan:    1) Head and Neck Cancer Status: No evidence of progression or recurrence in chest per recent CT scan.  Head and neck physical exam is also reassuring.  He is getting regular laryngoscopies with ENT.  2) Nutritional Status: no new issues   3) Risk Factors: The patient has been educated about risk factors including alcohol and tobacco abuse; they understand that avoidance of vaping/tobacco is important to prevent recurrences as well as other cancers, and he is abstaining  4) Swallowing: continue SLP exercises, swallowing has improved since trach removal  5)  Thyroid function: will check annually  - WNL today Lab Results  Component Value Date   TSH 3.472 07/20/2020   6)  Follow-up in 1mo with repeat chest CT. Continue close follow-up with otolaryngology.   On date of service, in total, I spent 20 minutes on this encounter. Patient was seen in person.  ___   Lonie Peak, MD

## 2020-08-17 ENCOUNTER — Ambulatory Visit (INDEPENDENT_AMBULATORY_CARE_PROVIDER_SITE_OTHER): Payer: Medicare Other | Admitting: Licensed Clinical Social Worker

## 2020-08-17 DIAGNOSIS — I10 Essential (primary) hypertension: Secondary | ICD-10-CM | POA: Diagnosis not present

## 2020-08-17 DIAGNOSIS — E785 Hyperlipidemia, unspecified: Secondary | ICD-10-CM

## 2020-08-17 DIAGNOSIS — E559 Vitamin D deficiency, unspecified: Secondary | ICD-10-CM

## 2020-08-17 DIAGNOSIS — K219 Gastro-esophageal reflux disease without esophagitis: Secondary | ICD-10-CM

## 2020-08-17 NOTE — Chronic Care Management (AMB) (Signed)
Chronic Care Management    Clinical Social Work Note  08/17/2020 Name: Logan Price MRN: [id number] DOB: 11-08-1945  Logan Price is a 75 y.o. year old male who is a primary care patient of [name], FNP. The CCM team was consulted to assist the patient with chronic disease management and/or care coordination needs related to: [name] .   Engaged with patient by telephone for follow up visit in response to provider referral for social work chronic care management and care coordination services.   Consent to Services:  The patient was given information about Chronic Care Management services, agreed to services, and gave verbal consent prior to initiation of services.  Please see initial visit note for detailed documentation.   Patient agreed to services and consent obtained.   Assessment: Review of patient past medical history, allergies, medications, and health status, including review of relevant consultants reports was performed today as part of a comprehensive evaluation and provision of chronic care management and care coordination services.     SDOH (Social Determinants of Health) assessments and interventions performed:  SDOH Interventions   Flowsheet Row Most Recent Value  SDOH Interventions   Depression Interventions/Treatment  --  [informed client of LCSW support and of RNCM support]       Advanced Directives Status: See Vynca application for related entries.  CCM Care Plan  No Known Allergies  Outpatient Encounter Medications as of 08/17/2020  Medication Sig  . doxazosin (CARDURA) 1 MG tablet Place 1 tablet (1 mg total) into feeding tube daily. (Patient taking differently: Take 1 mg by mouth daily.)  . pantoprazole (PROTONIX) 40 MG tablet TAKE ONE TABLET DAILY 30 MINUTES PRIOR TO THE FIRST MEAL  . simvastatin (ZOCOR) 20 MG tablet TAKE 1 TABLET DAILY AT 6PM   No facility-administered encounter medications on file as of 08/17/2020.    Patient Active  Problem List   Diagnosis Date Noted  . Low back pain 03/10/2020  . Malignant neoplasm of right upper lobe of lung (HCC) 05/27/2019  . Primary cancer of left upper lobe of lung (HCC) 05/27/2019  . Pseudomonas aeruginosa infection 05/21/2019  . Tracheobronchitis 05/21/2019  . Hx of radiation therapy 05/21/2019  . 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HCC) 05/21/2019  . Squamous cell lung cancer, right (HCC) 05/21/2019  . Adenocarcinoma, lung, left (HCC) 05/21/2019  . Lung nodule 04/23/2019  . Ground glass opacity present on imaging of lung 04/23/2019  . Head and neck cancer (HCC) 04/23/2019  . 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 Pleural effusion   . 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HCC)   . Bacteremia   . Leukocytosis   . Normocytic anemia   . PSVT (paroxysmal supraventricular tachycardia) (HCC) 10/03/2018  . Atrial fibrillation (HCC) 10/03/2018  . Goals of care, counseling/discussion   . Dysphagia   . Loss of weight   . 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 Gastritis determined by endoscopy   . Esophageal dysphagia   . 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 Globus sensation 12/12/2016  . Smokes with greater than 40 pack year history 10/20/2016  . Hemorrhoids, internal, with bleeding 10/06/2015  . History of colonic polyps 04/14/2015  . Vitamin D deficiency 10/09/2014  . HLD (hyperlipidemia) 10/08/2012  . GERD (gastroesophageal reflux disease) 10/08/2012  . HTN (hypertension)   . Diverticulitis 10/25/2009    Conditions to be addressed/monitored: Monitor client management of pain issues and mobility issues   Care Plan : LCSW care plan  Updates made by Isaiah Blakes, LCSW since 08/17/2020 12:00 AM    Problem: Coping Skills (General Plan of Care)     Goal: Manage mobility issues, manage pain issues and work to complete ADLs daily   Start Date: 08/17/2020  Expected End Date: 11/17/2020  This Visit's Progress: On track  Recent Progress: On track   Priority: High  Note:   Current barriers:   . Patient in need of assistance with connecting to community resources for helping client find needed resources to help him with managing mobility and ADLs completion . Patient is unable to independently navigate community resource options without care coordination support . Adenocarcinoma of Left Lung . Pain issues  Clinical Goals:  Patient will communicate with LCSW in next 30 days to discuss community resources of possible help to client Patient to communicate with LCSW in next 30 days to discuss client mobility and pain issues of client Patient to communicate with RNCM  in next 30 days as needed to discuss nursing needs of client  Clinical Interventions:  . Collaboration with [name], FNP regarding development and update of comprehensive plan of care as evidenced by provider attestation and co-signature . Talked with client about pain issues (client spoke of pain in his back) . Talked with client about mobility (he uses a cane as needed to walk) . Talked with client about appetite of client . Talked with client about sleeping issues of client . Talked with client about medication procurement . Talked with client about client completion of ADLs . Talked with client about upcoming client medical appointments . Talked with client about meal provision of client . Talked with client about relaxation techniques (watches TV to help him relax) . Encouraged client to call RNCM as needed for nursing support  Patient Strengths:  Attends scheduled medical appointments Takes medications as prescribed Has support from spouse Drives to needed appointments  Patient Deficits:  Pain issues and mobility issues  Patient Goals:   To attend scheduled medical appointments To take medications as prescribed To communicate regularly with his spouse to discuss client needs To communicate with RNCM or LCSW as needed for CCM support To  participate in physical therapy sessions as scheduled -  Follow Up Plan: LCSW to call client or spouse of client on 09/17/20 to assess client needs    Kelton Pillar.Vallerie Hentz MSW, LCSW Licensed Clinical Social Worker [facility] Care Management [phone]

## 2020-08-17 NOTE — Patient Instructions (Signed)
Visit Information  PATIENT GOALS: Goals Addressed            This Visit's Progress   . Protect My Health ; patient will work to improve mobility, manage pain issus and complete ADLs daily       Timeframe:  Short-Term Goal Priority:  Medium Progress: On Track Start Date:      08/17/20                      Expected End Date:    11/17/20                 Follow Up Date 09/17/20   Protect My Health (Patient) Patient will work to improve mobility, manage pain issues and complete ADLs daily    Why is this important?    Screening tests can find diseases early when they are easier to treat.   Your doctor or nurse will talk with you about which tests are important for you.   Getting shots for common diseases like the flu and shingles will help prevent them.      Patient Strengths:  Attends scheduled medical appointments Takes medications as prescribed Has support from spouse Drives to needed appointments  Patient Deficits:  Pain issues and mobility issues  Patient Goals:   To attend scheduled medical appointments To take medications as prescribed To communicate regularly with his spouse to discuss client needs To communicate with RNCM or LCSW as needed for CCM support To participate in physical therapy sessions as scheduled -  Follow Up Plan: LCSW to call client or spouse of client on 09/17/20 to assess client needs       Norva Riffle.Omara Alcon MSW, LCSW Licensed Clinical Social Worker [facility] Care Management [phone]

## 2020-09-14 ENCOUNTER — Ambulatory Visit (INDEPENDENT_AMBULATORY_CARE_PROVIDER_SITE_OTHER): Payer: Medicare Other | Admitting: Internal Medicine

## 2020-09-14 ENCOUNTER — Encounter (INDEPENDENT_AMBULATORY_CARE_PROVIDER_SITE_OTHER): Payer: Self-pay | Admitting: Internal Medicine

## 2020-09-14 ENCOUNTER — Ambulatory Visit (INDEPENDENT_AMBULATORY_CARE_PROVIDER_SITE_OTHER): Payer: PRIVATE HEALTH INSURANCE | Admitting: Internal Medicine

## 2020-09-14 ENCOUNTER — Other Ambulatory Visit: Payer: Self-pay

## 2020-09-14 VITALS — BP 166/90 | HR 97 | Temp 97.7°F | Ht 69.0 in | Wt 163.0 lb

## 2020-09-14 DIAGNOSIS — K219 Gastro-esophageal reflux disease without esophagitis: Secondary | ICD-10-CM

## 2020-09-14 NOTE — Patient Instructions (Signed)
Can take Tylenol up to 2 g/day divided dose for back pain Please arrange for follow-up visit with your back specialist. Can take OTC Advil no more than 400 mg twice a day with meals as needed until your visit with back specialist.

## 2020-09-14 NOTE — Progress Notes (Signed)
Presenting complaint;  Follow for chronic GERD.  Database and subjective:  Patient is 75 year old African-American male who has history of chronic GERD and Schatzki's ring which was last dilated by Dr. Oneida Alar in October 2018 who was complaining of dysphagia on his last visit.  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  He has been maintained on PPI.  He states he is not having heartburn or dysphagia.  He is a slow eater and tries to chew his food good. He had screening colonoscopy by Dr. Hurshel Keys on 06/22/2020 which revealed nodularity to mucosa at ileocecal valve/ileum.  Biopsy revealed chronic nonspecific ileitis.  Dr. [name] felt that these changes are due to diclofenac which was discontinued.  He also had internal hemorrhoids. Patient now complains of daily low back pain.  He has had shot to his back but it did not help for a long period he wonders if he can take NSAIDs. He denies abdominal pain melena or rectal bleeding. He has gained 10 pounds since his last visit.  He states his weight is now at baseline.  Current Medications: Outpatient Encounter Medications as of 09/14/2020  Medication Sig   doxazosin (CARDURA) 1 MG tablet Place 1 tablet (1 mg total) into feeding tube daily. (Patient taking differently: Take 1 mg by mouth daily.)   pantoprazole (PROTONIX) 40 MG tablet TAKE ONE TABLET DAILY 30 MINUTES PRIOR TO THE FIRST MEAL   simvastatin (ZOCOR) 20 MG tablet TAKE 1 TABLET DAILY AT 6PM   No facility-administered encounter medications on file as of 09/14/2020.    Objective: Blood pressure (!) 166/90, pulse 97, temperature 97.7 F (36.5 C), temperature source Oral, height 5' 9"  (1.753 m), weight 163 lb (73.9 kg). Patient is alert and in no acute distress. Conjunctiva is pink. Sclera is nonicteric Oropharyngeal mucosa is normal. No neck masses or thyromegaly noted. He has  tracheostomy scar. Cardiac exam with regular rhythm normal S1 and S2. No murmur or gallop noted. Lungs are clear to auscultation. Abdomen he has scar from prior gastrostomy tube placement and epigastric region to the left of midline.  On palpation abdomen is soft and nontender with organomegaly or masses. No LE edema or clubbing noted.  Labs/studies Results:   CBC Latest Ref Rng & Units 04/08/2020 04/30/2019 04/29/2019  WBC 3.4 - 10.8 x10E3/uL 4.1 10.1 5.0  Hemoglobin 13.0 - 17.7 g/dL 13.4 11.8(L) 11.9(L)  Hematocrit 37.5 - 51.0 % 40.1 37.7(L) 38.5(L)  Platelets 150 - 450 x10E3/uL 183 212 214    CMP Latest Ref Rng & Units 04/08/2020 09/18/2019 04/29/2019  Glucose 65 - 99 mg/dL 93 101(H) 94  BUN 8 - 27 mg/dL 16 13 25(H)  Creatinine 0.76 - 1.27 mg/dL 1.06 1.16 0.98  Sodium 134 - 144 mmol/L 140 143 142  Potassium 3.5 - 5.2 mmol/L 4.5 4.5 4.1  Chloride 96 - 106 mmol/L 105 105 103  CO2 20 - 29 mmol/L 26 30 28   Calcium 8.6 - 10.2 mg/dL 9.0 9.2 9.3  Total Protein 6.0 - 8.5 g/dL 6.6 7.0 6.6  Total Bilirubin 0.0 - 1.2 mg/dL 0.3 0.4 0.5  Alkaline Phos 44 - 121 IU/L 50 58 45  AST 0 - 40 IU/L 19 18 22   ALT 0 - 44 IU/L 20 13 19     Hepatic Function Latest Ref Rng & Units 04/08/2020 09/18/2019 04/29/2019  Total Protein 6.0 - 8.5 g/dL 6.6 7.0 6.6  Albumin 3.7 -  4.7 g/dL 4.4 3.8 3.4(L)  AST 0 - 40 IU/L 19 18 22   ALT 0 - 44 IU/L 20 13 19   Alk Phosphatase 44 - 121 IU/L 50 58 45  Total Bilirubin 0.0 - 1.2 mg/dL 0.3 0.4 0.5  Bilirubin, Direct 0.0 - 0.2 mg/dL - - -      Assessment:  #1.  Chronic GERD.  He is doing well with therapy.  He has history of Schatzki's ring which was dilated in fall 2018.  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  #2.  Low back pain.  Back pain has gotten worse since diclofenac was stopped because of nonspecific ileitis.  Until he can see his back specialist he can try Tylenol and if the Tylenol  does not work he can take OTC Advil on as-needed basis.  Plan:  Continue pantoprazole at 40 mg p.o. every morning.  He takes it 30 minutes before breakfast. He can try Tylenol up to 2 g/day in divided dose for back pain.  If it does not help he can take OTC Advil no more than 40 mg twice daily with snack or meal until he can see his back specialist. Patient will call if he has dysphagia or pantoprazole stops working. Office visit in 1 year.

## 2020-09-17 ENCOUNTER — Ambulatory Visit (INDEPENDENT_AMBULATORY_CARE_PROVIDER_SITE_OTHER): Payer: Medicare Other | Admitting: Licensed Clinical Social Worker

## 2020-09-17 DIAGNOSIS — E785 Hyperlipidemia, unspecified: Secondary | ICD-10-CM

## 2020-09-17 DIAGNOSIS — I10 Essential (primary) hypertension: Secondary | ICD-10-CM

## 2020-09-17 DIAGNOSIS — E559 Vitamin D deficiency, unspecified: Secondary | ICD-10-CM

## 2020-09-17 DIAGNOSIS — K219 Gastro-esophageal reflux disease without esophagitis: Secondary | ICD-10-CM

## 2020-09-17 NOTE — Patient Instructions (Signed)
Visit Information  PATIENT GOALS:  Goals Addressed             This Visit's Progress    Protect My Health ; patient will work to improve mobility, manage pain issus and complete ADLs daily       Timeframe:  Short-Term Goal Priority:  Medium Progress: On Track Start Date:      08/17/20                      Expected End Date:    11/17/20                 Follow Up Date 11/02/20   Protect My Health (Patient) Patient will work to improve mobility, manage pain issues and complete ADLs daily    Why is this important?   Screening tests can find diseases early when they are easier to treat.  Your doctor or nurse will talk with you about which tests are important for you.  Getting shots for common diseases like the flu and shingles will help prevent them.      Patient Strengths:  Attends scheduled medical appointments Takes medications as prescribed Has support from spouse Drives to needed appointments  Patient Deficits:  Pain issues and mobility issues  Patient Goals:   To attend scheduled medical appointments To take medications as prescribed To communicate regularly with his spouse to discuss client needs To communicate with RNCM or LCSW as needed for CCM support To participate in physical therapy sessions as scheduled -  Follow Up Plan: LCSW to call client or spouse of client on 11/02/20 to assess client needs     Norva Riffle.Rodney Wigger MSW, LCSW Licensed Clinical Social Worker [facility] Care Management [phone]

## 2020-09-17 NOTE — Chronic Care Management (AMB) (Signed)
Chronic Care Management    Clinical Social Work Note  09/17/2020 Name: Logan Price MRN: [id number] DOB: Nov 22, 1945  Logan Price is a 75 y.o. year old male who is a primary care patient of Logan Spencer, FNP. The CCM team was consulted to assist the patient with chronic disease management and/or care coordination needs related to: [name] .   Engaged with patient  and spouse of patient by telephone for follow up visit in response to provider referral for social work chronic care management and care coordination services.   Consent to Services:  The patient was given information about Chronic Care Management services, agreed to services, and gave verbal consent prior to initiation of services.  Please see initial visit note for detailed documentation.   Patient agreed to services and consent obtained.   Assessment: Review of patient past medical history, allergies, medications, and health status, including review of relevant consultants reports was performed today as part of a comprehensive evaluation and provision of chronic care management and care coordination services.     SDOH (Social Determinants of Health) assessments and interventions performed:  SDOH Interventions    Flowsheet Row Most Recent Value  SDOH Interventions   Depression Interventions/Treatment  --  [informed client of LCSW support and of RNCM support]        Advanced Directives Status: See Vynca application for related entries.  CCM Care Plan  No Known Allergies  Outpatient Encounter Medications as of 09/17/2020  Medication Sig   doxazosin (CARDURA) 1 MG tablet Place 1 tablet (1 mg total) into feeding tube daily. (Patient taking differently: Take 1 mg by mouth daily.)   pantoprazole (PROTONIX) 40 MG tablet TAKE ONE TABLET DAILY 30 MINUTES PRIOR TO THE FIRST MEAL   simvastatin (ZOCOR) 20 MG tablet TAKE 1 TABLET DAILY AT 6PM   No facility-administered encounter medications on file as of  09/17/2020.    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   Primary cancer of left upper lobe of lung (HCC) 05/27/2019   Pseudomonas aeruginosa infection 05/21/2019   Tracheobronchitis 05/21/2019   Hx of radiation therapy 05/21/2019   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HCC) 05/21/2019   Squamous cell lung cancer, right (HCC) 05/21/2019   Adenocarcinoma, lung, left (HCC) 05/21/2019   Lung nodule 04/23/2019   Ground glass opacity present on imaging of lung 04/23/2019   Head and neck cancer (HCC) 04/23/2019   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   Pleural effusion    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HCC)    Bacteremia    Leukocytosis    Normocytic anemia    PSVT (paroxysmal supraventricular tachycardia) (HCC) 10/03/2018   Atrial fibrillation (HCC) 10/03/2018   Goals of care, counseling/discussion    Dysphagia    Loss of weight    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   Gastritis determined by endoscopy    Esophageal dysphagia    False positive serological test for hepatitis C 12/13/2016   Globus sensation 12/12/2016   Smokes with greater than 40 pack year history 10/20/2016   Hemorrhoids, internal, with bleeding 10/06/2015   History of colonic polyps 04/14/2015   Vitamin D deficiency 10/09/2014   HLD (hyperlipidemia) 10/08/2012   GERD (gastroesophageal reflux disease) 10/08/2012   HTN (hypertension)    Diverticulitis 10/25/2009    Conditions to be addressed/monitored: Monitor client completion of ADLs; monitor client mobility issues  Care Plan : LCSW care plan  Updates made by Logan Blakes, LCSW since 09/17/2020 12:00 AM     Problem: Coping Skills (General Plan of Care)      Goal: Manage mobility issues, manage pain issues and work to complete ADLs daily   Start Date: 08/17/2020  Expected End Date: 11/17/2020  This Visit's Progress: On track  Recent Progress: On  track  Priority: High  Note:   Current barriers:   Patient in need of assistance with connecting to community resources for helping client find needed resources to help him with managing mobility and ADLs completion Patient is unable to independently navigate community resource options without care coordination support Adenocarcinoma of Left Lung Pain issues  Clinical Goals:  Patient will communicate with LCSW in next 30 days to discuss community resources of possible help to client Patient to communicate with LCSW in next 30 days to discuss client mobility and pain issues of client Patient to communicate with RNCM  in next 30 days as needed to discuss nursing needs of client  Clinical Interventions:  Collaboration with Logan Spencer, FNP regarding development and update of comprehensive plan of care as evidenced by provider attestation and co-signature Talked with Logan Price, spouse of client about pain issues of client (client has pain in his back) Talked with client about mobility (he uses a cane as needed to walk) Talked with Logan Price about his upcoming appointment with Logan Price  on November 23, 2020 and his appointment with Logan Price in September of 2022 Talked with client about appetite of client Talked with client about sleeping issues of client Talked with client about medication procurement Talked with client about client completion of ADLs Talked with client about upcoming client medical appointments Talked with client about meal provision of client Talked with client about relaxation techniques (watches TV to help him relax, likes to be outdoors occasionally) Encouraged client to call RNCM as needed for nursing support  Patient Strengths:  Attends scheduled medical appointments Takes medications as prescribed Has support from spouse Drives to needed appointments  Patient Deficits:  Pain issues and mobility issues  Patient Goals:   To attend scheduled medical  appointments To take medications as prescribed To communicate regularly with his spouse to discuss client needs To communicate with RNCM or LCSW as needed for CCM support To participate in physical therapy sessions as scheduled -  Follow Up Plan: LCSW to call client or spouse of client on 11/02/20 to assess client needs      Logan Price MSW, LCSW Licensed Clinical Social Worker [facility] Care Management [phone]

## 2020-09-19 ENCOUNTER — Other Ambulatory Visit: Payer: Self-pay | Admitting: Family

## 2020-09-19 DIAGNOSIS — E785 Hyperlipidemia, unspecified: Secondary | ICD-10-CM

## 2020-09-30 ENCOUNTER — Encounter: Payer: Self-pay | Admitting: Family Medicine

## 2020-09-30 ENCOUNTER — Ambulatory Visit (INDEPENDENT_AMBULATORY_CARE_PROVIDER_SITE_OTHER): Payer: Medicare Other | Admitting: Family Medicine

## 2020-09-30 DIAGNOSIS — Z20822 Contact with and (suspected) exposure to covid-19: Secondary | ICD-10-CM | POA: Diagnosis not present

## 2020-09-30 NOTE — Addendum Note (Signed)
Addended by: Reather Converse on: 09/30/2020 10:07 AM   Modules accepted: Orders

## 2020-09-30 NOTE — Progress Notes (Signed)
   Virtual Visit via Telephone Note  I connected with Logan Price on 09/30/20 at 9:08 AM by telephone and verified that I am speaking with the correct person using two identifiers. Logan Price is currently located at home and his wife is currently with him during this visit. The provider, Loman Brooklyn, FNP is located in their office at time of visit.  I discussed the limitations, risks, security and privacy concerns of performing an evaluation and management service by telephone and the availability of in person appointments. I also discussed with the patient that there may be a patient responsible charge related to this service. The patient expressed understanding and agreed to proceed.  Subjective: PCP: Sharion Balloon, FNP  Chief Complaint  Patient presents with   Covid Exposure   Patient reports he was exposed to COVID 5 days ago and would like to be tested. He does not currently have any symptoms.    ROS: Per HPI  Current Outpatient Medications:    doxazosin (CARDURA) 1 MG tablet, Place 1 tablet (1 mg total) into feeding tube daily. (Patient taking differently: Take 1 mg by mouth daily.), Disp: 30 tablet, Rfl: 5   pantoprazole (PROTONIX) 40 MG tablet, TAKE ONE TABLET DAILY 30 MINUTES PRIOR TO THE FIRST MEAL, Disp: 90 tablet, Rfl: 2   simvastatin (ZOCOR) 20 MG tablet, TAKE 1 TABLET DAILY AT 6PM, Disp: 90 tablet, Rfl: 0  No Known Allergies 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   HTN (hypertension)    Hyperlipidemia     Observations/Objective: A&O  No respiratory distress or wheezing audible over the phone Mood, judgement, and thought processes all WNL   Assessment and Plan: 1. Close exposure to COVID-19 virus Asymptomatic.   - Novel Coronavirus, NAA  ([facility]); Future   Follow Up Instructions:  I discussed the assessment and treatment plan with the patient. The patient was provided an opportunity to ask questions and all were answered. The patient agreed with the plan and demonstrated an understanding of the instructions.   The patient was advised to call back or seek an in-person evaluation if the symptoms worsen or if the condition fails to improve as anticipated.  The above assessment and management plan was discussed with the patient. The patient verbalized understanding of and has agreed to the management plan. Patient is aware to call the clinic if symptoms persist or worsen. Patient is aware when to return to the clinic for a follow-up visit. Patient educated on when it is appropriate to go to the emergency department.   Time call ended: 9:13 AM  I provided 5 minutes of non-face-to-face time during this encounter.  Hendricks Limes, MSN, APRN, FNP-C [facility] 09/30/20

## 2020-10-01 LAB — NOVEL CORONAVIRUS, NAA: SARS-CoV-2, NAA: NOT DETECTED

## 2020-10-01 LAB — SARS-COV-2, NAA 2 DAY TAT

## 2020-10-07 ENCOUNTER — Encounter: Payer: Self-pay | Admitting: Family

## 2020-10-07 ENCOUNTER — Ambulatory Visit (INDEPENDENT_AMBULATORY_CARE_PROVIDER_SITE_OTHER): Payer: Medicare Other | Admitting: Family

## 2020-10-07 ENCOUNTER — Other Ambulatory Visit: Payer: Self-pay

## 2020-10-07 VITALS — BP 120/70 | HR 91 | Temp 97.8°F | Ht 69.0 in | Wt 163.8 lb

## 2020-10-07 DIAGNOSIS — M5442 Lumbago with sciatica, left side: Secondary | ICD-10-CM | POA: Diagnosis not present

## 2020-10-07 DIAGNOSIS — I471 Supraventricular tachycardia: Secondary | ICD-10-CM

## 2020-10-07 DIAGNOSIS — E559 Vitamin D deficiency, unspecified: Secondary | ICD-10-CM

## 2020-10-07 DIAGNOSIS — G8929 Other chronic pain: Secondary | ICD-10-CM | POA: Diagnosis not present

## 2020-10-07 DIAGNOSIS — E785 Hyperlipidemia, unspecified: Secondary | ICD-10-CM | POA: Diagnosis not present

## 2020-10-07 DIAGNOSIS — R531 Weakness: Secondary | ICD-10-CM

## 2020-10-07 DIAGNOSIS — I4891 Unspecified atrial fibrillation: Secondary | ICD-10-CM | POA: Diagnosis not present

## 2020-10-07 DIAGNOSIS — C320 Malignant neoplasm of glottis: Secondary | ICD-10-CM

## 2020-10-07 DIAGNOSIS — K219 Gastro-esophageal reflux disease without esophagitis: Secondary | ICD-10-CM

## 2020-10-07 DIAGNOSIS — I10 Essential (primary) hypertension: Secondary | ICD-10-CM

## 2020-10-07 DIAGNOSIS — N401 Enlarged prostate with lower urinary tract symptoms: Secondary | ICD-10-CM | POA: Diagnosis not present

## 2020-10-07 DIAGNOSIS — N138 Other obstructive and reflux uropathy: Secondary | ICD-10-CM

## 2020-10-07 DIAGNOSIS — Z23 Encounter for immunization: Secondary | ICD-10-CM

## 2020-10-07 DIAGNOSIS — M5441 Lumbago with sciatica, right side: Secondary | ICD-10-CM | POA: Diagnosis not present

## 2020-10-07 LAB — CBC WITH DIFFERENTIAL/PLATELET
Basophils Absolute: 0 10*3/uL (ref 0.0–0.2)
Basos: 1 %
EOS (ABSOLUTE): 0.1 10*3/uL (ref 0.0–0.4)
Eos: 2 %
Hematocrit: 39.5 % (ref 37.5–51.0)
Hemoglobin: 12.9 g/dL — ABNORMAL LOW (ref 13.0–17.7)
Immature Grans (Abs): 0 10*3/uL (ref 0.0–0.1)
Immature Granulocytes: 0 %
Lymphocytes Absolute: 0.9 10*3/uL (ref 0.7–3.1)
Lymphs: 21 %
MCH: 29.6 pg (ref 26.6–33.0)
MCHC: 32.7 g/dL (ref 31.5–35.7)
MCV: 91 fL (ref 79–97)
Monocytes Absolute: 0.4 10*3/uL (ref 0.1–0.9)
Monocytes: 10 %
Neutrophils Absolute: 3 10*3/uL (ref 1.4–7.0)
Neutrophils: 66 %
Platelets: 185 10*3/uL (ref 150–450)
RBC: 4.36 x10E6/uL (ref 4.14–5.80)
RDW: 13.4 % (ref 11.6–15.4)
WBC: 4.4 10*3/uL (ref 3.4–10.8)

## 2020-10-07 LAB — CMP14+EGFR
ALT: 18 IU/L (ref 0–44)
AST: 23 IU/L (ref 0–40)
Albumin/Globulin Ratio: 1.9 (ref 1.2–2.2)
Albumin: 4.2 g/dL (ref 3.7–4.7)
Alkaline Phosphatase: 53 IU/L (ref 44–121)
BUN/Creatinine Ratio: 11 (ref 10–24)
BUN: 12 mg/dL (ref 8–27)
Bilirubin Total: 0.3 mg/dL (ref 0.0–1.2)
CO2: 25 mmol/L (ref 20–29)
Calcium: 9.2 mg/dL (ref 8.6–10.2)
Chloride: 103 mmol/L (ref 96–106)
Creatinine, Ser: 1.1 mg/dL (ref 0.76–1.27)
Globulin, Total: 2.2 g/dL (ref 1.5–4.5)
Glucose: 121 mg/dL — ABNORMAL HIGH (ref 65–99)
Potassium: 4 mmol/L (ref 3.5–5.2)
Sodium: 141 mmol/L (ref 134–144)
Total Protein: 6.4 g/dL (ref 6.0–8.5)
eGFR: 71 mL/min/{1.73_m2} (ref >59)

## 2020-10-07 NOTE — Patient Instructions (Signed)
Deconditioning Deconditioning refers to the changes in the body that occur during a period of inactivity. The changes happen in the heart, lungs, and muscles. They make you feel tired and weak (fatigued) and decrease your ability to be active. The three stages of deconditioning include: Mild deconditioning. This is a change in your ability to do your usual exercise activities, such as running, biking, or swimming. Moderate deconditioning. This is a change in your ability to do normal everyday activities, such as walking, shopping for groceries, and doing chores. Severe deconditioning. In this stage, you may not be able to do minimal activity or usual self-care. What are the causes? Deconditioning can occur after only a few days of inactivity. The longer the period of inactivity, the more severe the deconditioning will be, and the longer it will take to return to your previous level of functioning. Deconditioning is often caused by inactivity due to: Illnesses, such as cancer, stroke, heart attack, fibromyalgia, and chronic fatigue syndrome. Injuries, especially back injuries, broken bones, and injuries to soft tissues, such as ligaments and tendons. A long stay in the hospital. Pregnancy, especially if long periods of bed rest are needed. What increases the risk? The following factors may make you more likely to develop this condition: Staying in the hospital or being on bed rest. Obesity. Poor nutrition. Being an older adult. Having an injury or illness that affects your movement and activity. What are the signs or symptoms? Symptoms of this condition include: Weakness and tiredness. Shortness of breath with minor physical effort (exertion). A heartbeat that is faster than normal. You may not notice this without taking your pulse. Pain or discomfort with activity. Decreased strength, endurance, and balance. Difficulty doing your usual forms of exercise. Difficulty doing activities of daily  living, such as grocery shopping or chores. You may also have problems walking around the house and doing basic self-care, such as getting to the bathroom, preparing meals, or doing laundry. How is this diagnosed? This condition is diagnosed based on your medical history and a physical exam. During the physical exam, your health care provider will check for signs of deconditioning, such as: Decreased size of muscles. Decreased strength. Trouble with balance. Shortness of breath or a heart rate that is faster than normal after minor exertion. How is this treated? Treatment for this condition involves an exercise program in which activity is increased slowly. Your health care provider will tell you which exercises are right for you. The exercise program will likely include: Aerobic exercise. This type of exercise helps improve the functioning of the heart, lungs, and muscles. Strength training. This type of exercise helps increase muscle size and strength. Both of these types of exercise will improve your endurance. You may be referred to a physical therapist who can create a safe strengthening programfor you to follow. Follow these instructions at home: Eating and drinking  Eat a healthy, well-balanced diet. This includes: Proteins, such as lean meats and fish, to build muscles. Fresh fruits and vegetables. Carbohydrates, such as whole grains, to boost energy. Drink enough fluid to keep your urine pale yellow.  Activity  Follow the exercise program that is recommended by your health care provider or physical therapist. Do not increase your exercise any faster than directed.  General instructions Take over-the-counter and prescription medicines only as told by your health care provider. Do not use any products that contain nicotine or tobacco, such as cigarettes, e-cigarettes, and chewing tobacco. If you need help quitting, ask your health   care provider. Keep all follow-up visits as told  by your health care provider. This is important. Contact a health care provider if: You are not able to do the recommended exercise program. You are becoming more and more tired and weak. You become light-headed when rising to a sitting or standing position. Your level of endurance decreases after it has improved. Get help right away if you: Have chest pain. Are very short of breath. Have any episodes of fainting. Summary Deconditioning refers to the changes in the body that occur during a period of inactivity. Deconditioning happens in the heart, lungs, and muscles. The changes make you feel tired and weak and decrease your ability to be active. Treatment for deconditioning involves an exercise program in which activity is increased slowly. This information is not intended to replace advice given to you by your health care provider. Make sure you discuss any questions you have with your healthcare provider. Document Revised: 08/08/2018 Document Reviewed: 08/08/2018 Elsevier Patient Education  2022 Else[employer] Inc.  

## 2020-10-07 NOTE — Progress Notes (Signed)
Subjective:    Patient ID: Logan Price, male    DOB: 07/08/1945, 75 y.o.   MRN: [id number]  Chief Complaint  Patient presents with   Medical Management of Chronic Issues   Pt presents to the office today for chronic follow up. 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   He is followed by ENT and had his trach reversal in March 2021.  He is currently on a regular diet.     Pt has seen Ortho for chronic back pain and is currently getting steroid injections and completed PT. He reports his pain constantly a 8 out 10.   PT complaining of generalized weakness. His wife is scared he is going to fall.  Gastroesophageal Reflux He complains of belching and heartburn. This is a chronic problem. The current episode started more than 1 year ago. The problem occurs occasionally. The problem has been waxing and waning. He has tried a PPI for the symptoms. The treatment provided moderate relief.  Hypertension This is a chronic problem. The current episode started more than 1 year ago. The problem has been resolved since onset. The problem is controlled. Associated symptoms include malaise/fatigue. Pertinent negatives include no peripheral edema or shortness of breath. Risk factors for coronary artery disease include dyslipidemia and male gender. The current treatment provides moderate improvement.  Benign Prostatic Hypertrophy This is a chronic problem. The current episode started more than 1 year ago. The problem has been waxing and waning since onset. Irritative symptoms include nocturia (2) and urgency. Obstructive symptoms include an intermittent stream.  Hyperlipidemia This is a chronic problem. The current episode started more than 1 year ago. Pertinent negatives include no shortness of breath. Current antihyperlipidemic treatment includes statins. The current treatment provides moderate improvement of lipids. Risk factors for coronary artery  disease include dyslipidemia, male sex, hypertension and a sedentary lifestyle.     Review of Systems  Constitutional:  Positive for malaise/fatigue.  Respiratory:  Negative for shortness of breath.   Gastrointestinal:  Positive for heartburn.  Genitourinary:  Positive for nocturia (2) and urgency.  All other systems reviewed and are negative.     Objective:   Physical Exam Vitals reviewed.  Constitutional:      General: He is not in acute distress.    Appearance: He is well-developed.  HENT:     Head: Normocephalic.     Right Ear: Tympanic membrane normal.     Left Ear: Tympanic membrane normal.  Eyes:     General:        Right eye: No discharge.        Left eye: No discharge.     Pupils: Pupils are equal, round, and reactive to light.  Neck:     Thyroid: No thyromegaly.  Cardiovascular:     Rate and Rhythm: Normal rate and regular rhythm.     Heart sounds: Normal heart sounds. No murmur heard. Pulmonary:     Effort: Pulmonary effort is normal. No respiratory distress.     Breath sounds: Normal breath sounds. No wheezing.  Abdominal:     General: Bowel sounds are normal. There is no distension.     Palpations: Abdomen is soft.     Tenderness: There is no abdominal tenderness.  Musculoskeletal:        General: No tenderness. Normal range of motion.     Cervical back: Normal range of motion and neck supple.  Skin:    General:  Skin is warm and dry.     Findings: No erythema or rash.  Neurological:     Mental Status: He is alert and oriented to person, place, and time.     Cranial Nerves: No cranial nerve deficit.     Deep Tendon Reflexes: Reflexes are normal and symmetric.  Psychiatric:        Behavior: Behavior normal.        Thought Content: Thought content normal.        Judgment: Judgment normal.      BP 120/70   Pulse 91   Temp 97.8 F (36.6 C) (Temporal)   Ht 5\' 9"  (1.753 m)   Wt 163 lb 12.8 oz (74.3 kg)   SpO2 97%   BMI 24.19 kg/m       Assessment & Plan:  Logan Price comes in today with chief complaint of Medical Management of Chronic Issues   Diagnosis and orders addressed:  1. Primary hypertension - CMP14+EGFR - CBC with Differential/Platelet  2. Atrial fibrillation, unspecified type (HCC) - CMP14+EGFR - CBC with Differential/Platelet  3. PSVT (paroxysmal supraventricular tachycardia) (HCC) - CMP14+EGFR - CBC with Differential/Platelet  4. Gastroesophageal reflux disease, unspecified whether esophagitis present - CMP14+EGFR - CBC with Differential/Platelet  5. Vitamin D deficiency - CMP14+EGFR - CBC with Differential/Platelet  6. Hyperlipidemia, unspecified hyperlipidemia type - CMP14+EGFR - CBC with Differential/Platelet  7. Squamous cell carcinoma of glottis (HCC)  - Ambulatory referral to Physical Therapy - CMP14+EGFR - CBC with Differential/Platelet  8. BPH with urinary obstruction - CMP14+EGFR - CBC with Differential/Platelet  9. Chronic bilateral low back pain with bilateral sciatica - CMP14+EGFR - CBC with Differential/Platelet  10. Weakness  - Ambulatory referral to Physical Therapy - CMP14+EGFR - CBC with Differential/Platelet   Labs pending Health Maintenance reviewed Diet and exercise encouraged  Follow up plan: 6 months   [name], FNP

## 2020-10-25 ENCOUNTER — Encounter (HOSPITAL_COMMUNITY): Payer: Self-pay | Admitting: Physical Therapy

## 2020-10-25 ENCOUNTER — Ambulatory Visit (HOSPITAL_COMMUNITY): Payer: Medicare Other | Attending: Family | Admitting: Physical Therapy

## 2020-10-25 ENCOUNTER — Other Ambulatory Visit: Payer: Self-pay

## 2020-10-25 DIAGNOSIS — G8929 Other chronic pain: Secondary | ICD-10-CM | POA: Diagnosis not present

## 2020-10-25 DIAGNOSIS — R2689 Other abnormalities of gait and mobility: Secondary | ICD-10-CM | POA: Insufficient documentation

## 2020-10-25 DIAGNOSIS — M545 Low back pain, unspecified: Secondary | ICD-10-CM | POA: Diagnosis not present

## 2020-10-25 DIAGNOSIS — M6281 Muscle weakness (generalized): Secondary | ICD-10-CM | POA: Insufficient documentation

## 2020-10-25 NOTE — Therapy (Signed)
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   Gastritis determined by endoscopy    Esophageal dysphagia    False positive serological test for hepatitis C 12/13/2016   Globus sensation 12/12/2016   Smokes with greater than 40 pack year history 10/20/2016   Hemorrhoids, internal, with bleeding 10/06/2015   History of colonic polyps 04/14/2015   Vitamin D deficiency 10/09/2014   HLD (hyperlipidemia) 10/08/2012   GERD (gastroesophageal reflux disease) 10/08/2012   HTN (hypertension)    Diverticulitis 10/25/2009    3:29 PM, 10/25/20 Mearl Latin PT, DPT Physical Therapist at [place], [name], [address] Phone: [phone]   Fax:  [phone]  Name: Logan Price MRN: [id number] Date of Birth: [birth year]  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   Gastritis determined by endoscopy    Esophageal dysphagia    False positive serological test for hepatitis C 12/13/2016   Globus sensation 12/12/2016   Smokes with greater than 40 pack year history 10/20/2016   Hemorrhoids, internal, with bleeding 10/06/2015   History of colonic polyps 04/14/2015   Vitamin D deficiency 10/09/2014   HLD (hyperlipidemia) 10/08/2012   GERD (gastroesophageal reflux disease) 10/08/2012   HTN (hypertension)    Diverticulitis 10/25/2009    3:29 PM, 10/25/20 Mearl Latin PT, DPT Physical Therapist at [place], [name], [address] Phone: [phone]   Fax:  [phone]  Name: Logan Price MRN: [id number] Date of Birth: [birth year]  [place], [name], [address] Phone: [phone]   Fax:  [phone]  Physical Therapy Evaluation  Patient Details  Name: Logan Price MRN: [id number] Date of Birth: 07/06/1945 Referring Provider (PT): Evelina Dun FNP   Encounter Date: 10/25/2020   PT End of Session - 10/25/20 1521     Visit Number 1    Number of Visits 8    Date for PT Re-Evaluation 11/22/20    Authorization Type Primary Medicare Secondary general commercial    PT Start Time 1445    PT Stop Time 1517    PT Time Calculation (min) 32 min    Activity Tolerance Patient tolerated treatment well    Behavior During Therapy [facility] for tasks assessed/performed             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   HTN (hypertension)    Hyperlipidemia     Past Surgical History:  Procedure Laterality Date   BIOPSY  05/04/2015   Procedure: BIOPSY;  Surgeon: Danie Binder, MD;  [facility];  Service: Endoscopy;;  bx's of ileocecal valve    BIOPSY  06/22/2020   Procedure: BIOPSY;  Surgeon: Eloise Harman, DO;  [facility];  Service: Endoscopy;;   BRONCHIAL BRUSHINGS  04/29/2019   Procedure: BRONCHIAL BRUSHINGS;  Surgeon: Garner Nash, DO;  Location: [facility];  Service: Pulmonary;;   BRONCHIAL NEEDLE ASPIRATION BIOPSY  04/29/2019   Procedure: BRONCHIAL NEEDLE ASPIRATION BIOPSIES;  Surgeon: Garner Nash, DO;  Location: [facility];  Service: Pulmonary;;   BRONCHIAL WASHINGS  04/29/2019   Procedure: BRONCHIAL WASHINGS;  Surgeon: Garner Nash, DO;  [facility];  Service: Pulmonary;;   COLONOSCOPY WITH PROPOFOL N/A 05/04/2015   Dr. Oneida Alar: normal appearing ileum with prominent IC valve with tubular  adenomas, moderate diverticulosis in sigmoid colon, ascending colon, and retum. Moderate sized internal hemorrhoids. Surveillance in 5 years   COLONOSCOPY WITH PROPOFOL N/A 06/22/2020   Procedure: COLONOSCOPY WITH PROPOFOL;  Surgeon: Eloise Harman, DO;  [facility];  Service: Endoscopy;  Laterality: N/A;  PM   ELECTROMAGNETIC NAVIGATION BROCHOSCOPY  04/29/2019   Procedure: NAVIGATION BRONCHOSCOPY;  Surgeon: Garner Nash, DO;  Location: [facility];  Service: Pulmonary;;   ESOPHAGOGASTRODUODENOSCOPY (EGD) WITH PROPOFOL N/A 12/19/2016   Procedure: ESOPHAGOGASTRODUODENOSCOPY (EGD) WITH PROPOFOL;  Surgeon: Danie Binder, MD;  [facility];  Service: Endoscopy;  Laterality: N/A;  11:30am   FLEXIBLE SIGMOIDOSCOPY N/A 12/10/2015   hemorrhoid banding X 3    HEMORRHOID BANDING N/A 12/10/2015   Procedure: HEMORRHOID BANDING;  Surgeon: Danie Binder, MD;  [facility];  Service: Endoscopy;  Laterality: N/A;  1:30 PM   INSERTION OF SUPRAPUBIC CATHETER N/A 02/24/2019   Procedure: INSERTION OF SUPRAPUBIC CATHETER;  Surgeon: Kathie Rhodes, MD;  Location: [facility];  Service: Urology;  Laterality: N/A;   IR CM INJ ANY COLONIC TUBE W/FLUORO  10/30/2018   IR GASTROSTOMY TUBE MOD SED  10/04/2018   IR GASTROSTOMY TUBE REMOVAL  10/08/2019   IR IMAGING GUIDED PORT INSERTION  10/04/2018   IR REMOVAL TUN ACCESS W/ PORT W/O FL MOD SED  10/14/2018   IR REPLACE G-TUBE SIMPLE  [place], [name], [address] Phone: [phone]   Fax:  [phone]  Physical Therapy Evaluation  Patient Details  Name: Logan Price MRN: [id number] Date of Birth: 07/06/1945 Referring Provider (PT): Evelina Dun FNP   Encounter Date: 10/25/2020   PT End of Session - 10/25/20 1521     Visit Number 1    Number of Visits 8    Date for PT Re-Evaluation 11/22/20    Authorization Type Primary Medicare Secondary general commercial    PT Start Time 1445    PT Stop Time 1517    PT Time Calculation (min) 32 min    Activity Tolerance Patient tolerated treatment well    Behavior During Therapy [facility] for tasks assessed/performed             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   HTN (hypertension)    Hyperlipidemia     Past Surgical History:  Procedure Laterality Date   BIOPSY  05/04/2015   Procedure: BIOPSY;  Surgeon: Danie Binder, MD;  [facility];  Service: Endoscopy;;  bx's of ileocecal valve    BIOPSY  06/22/2020   Procedure: BIOPSY;  Surgeon: Eloise Harman, DO;  [facility];  Service: Endoscopy;;   BRONCHIAL BRUSHINGS  04/29/2019   Procedure: BRONCHIAL BRUSHINGS;  Surgeon: Garner Nash, DO;  Location: [facility];  Service: Pulmonary;;   BRONCHIAL NEEDLE ASPIRATION BIOPSY  04/29/2019   Procedure: BRONCHIAL NEEDLE ASPIRATION BIOPSIES;  Surgeon: Garner Nash, DO;  Location: [facility];  Service: Pulmonary;;   BRONCHIAL WASHINGS  04/29/2019   Procedure: BRONCHIAL WASHINGS;  Surgeon: Garner Nash, DO;  [facility];  Service: Pulmonary;;   COLONOSCOPY WITH PROPOFOL N/A 05/04/2015   Dr. Oneida Alar: normal appearing ileum with prominent IC valve with tubular  adenomas, moderate diverticulosis in sigmoid colon, ascending colon, and retum. Moderate sized internal hemorrhoids. Surveillance in 5 years   COLONOSCOPY WITH PROPOFOL N/A 06/22/2020   Procedure: COLONOSCOPY WITH PROPOFOL;  Surgeon: Eloise Harman, DO;  [facility];  Service: Endoscopy;  Laterality: N/A;  PM   ELECTROMAGNETIC NAVIGATION BROCHOSCOPY  04/29/2019   Procedure: NAVIGATION BRONCHOSCOPY;  Surgeon: Garner Nash, DO;  Location: [facility];  Service: Pulmonary;;   ESOPHAGOGASTRODUODENOSCOPY (EGD) WITH PROPOFOL N/A 12/19/2016   Procedure: ESOPHAGOGASTRODUODENOSCOPY (EGD) WITH PROPOFOL;  Surgeon: Danie Binder, MD;  [facility];  Service: Endoscopy;  Laterality: N/A;  11:30am   FLEXIBLE SIGMOIDOSCOPY N/A 12/10/2015   hemorrhoid banding X 3    HEMORRHOID BANDING N/A 12/10/2015   Procedure: HEMORRHOID BANDING;  Surgeon: Danie Binder, MD;  [facility];  Service: Endoscopy;  Laterality: N/A;  1:30 PM   INSERTION OF SUPRAPUBIC CATHETER N/A 02/24/2019   Procedure: INSERTION OF SUPRAPUBIC CATHETER;  Surgeon: Kathie Rhodes, MD;  [facility];  Service: Urology;  Laterality: N/A;   IR CM INJ ANY COLONIC TUBE W/FLUORO  10/30/2018   IR GASTROSTOMY TUBE MOD SED  10/04/2018   IR GASTROSTOMY TUBE REMOVAL  10/08/2019   IR IMAGING GUIDED PORT INSERTION  10/04/2018   IR REMOVAL TUN ACCESS W/ PORT W/O FL MOD SED  10/14/2018   IR REPLACE G-TUBE SIMPLE  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   Gastritis determined by endoscopy    Esophageal dysphagia    False positive serological test for hepatitis C 12/13/2016   Globus sensation 12/12/2016   Smokes with greater than 40 pack year history 10/20/2016   Hemorrhoids, internal, with bleeding 10/06/2015   History of colonic polyps 04/14/2015   Vitamin D deficiency 10/09/2014   HLD (hyperlipidemia) 10/08/2012   GERD (gastroesophageal reflux disease) 10/08/2012   HTN (hypertension)    Diverticulitis 10/25/2009    3:29 PM, 10/25/20 Mearl Latin PT, DPT Physical Therapist at [place], [name], [address] Phone: [phone]   Fax:  [phone]  Name: Logan Price MRN: [id number] Date of Birth: [birth year]

## 2020-10-25 NOTE — Patient Instructions (Signed)
Access Code: [email or web address] URL: [email or web address] Date: 10/25/2020 Prepared by: Margie Billet  Exercises Seated March - 3 x daily - 7 x weekly - 1 sets - 10 reps - 5 second hold Seated Long Arc Quad - 3 x daily - 7 x weekly - 1 sets - 10 reps Sit to Stand with Arms Crossed - 3 x daily - 7 x weekly - 1 sets - 10 reps

## 2020-10-28 ENCOUNTER — Ambulatory Visit (HOSPITAL_COMMUNITY): Payer: Medicare Other | Admitting: Physical Therapy

## 2020-11-02 ENCOUNTER — Ambulatory Visit (INDEPENDENT_AMBULATORY_CARE_PROVIDER_SITE_OTHER): Payer: Medicare Other | Admitting: Licensed Clinical Social Worker

## 2020-11-02 DIAGNOSIS — E785 Hyperlipidemia, unspecified: Secondary | ICD-10-CM

## 2020-11-02 DIAGNOSIS — C3492 Malignant neoplasm of unspecified part of left bronchus or lung: Secondary | ICD-10-CM | POA: Diagnosis not present

## 2020-11-02 DIAGNOSIS — I10 Essential (primary) hypertension: Secondary | ICD-10-CM | POA: Diagnosis not present

## 2020-11-02 DIAGNOSIS — K219 Gastro-esophageal reflux disease without esophagitis: Secondary | ICD-10-CM

## 2020-11-02 DIAGNOSIS — E559 Vitamin D deficiency, unspecified: Secondary | ICD-10-CM

## 2020-11-02 DIAGNOSIS — I4891 Unspecified atrial fibrillation: Secondary | ICD-10-CM

## 2020-11-02 NOTE — Patient Instructions (Signed)
Visit Information  PATIENT GOALS:  Goals Addressed             This Visit's Progress    Manage My Emotions       Protect My Health ; patient will work to improve mobility, manage pain issus and complete ADLs daily       Timeframe:  Short-Term Goal Priority:  Medium Progress: On Track Start Date:      11/02/20                      Expected End Date:    02/02/21                 Follow Up Date 12/10/20   Protect My Health (Patient) Patient will work to improve mobility, manage pain issues and complete ADLs daily    Why is this important?   Screening tests can find diseases early when they are easier to treat.  Your doctor or nurse will talk with you about which tests are important for you.  Getting shots for common diseases like the flu and shingles will help prevent them.      Patient Strengths:  Attends scheduled medical appointments Takes medications as prescribed Has support from spouse Drives to needed appointments  Patient Deficits:  Pain issues and mobility issues  Patient Goals:   To attend scheduled medical appointments To take medications as prescribed To communicate regularly with his spouse to discuss client needs To communicate with RNCM or LCSW as needed for CCM support To participate in physical therapy sessions as scheduled -  Follow Up Plan: LCSW to call client or spouse of client on 12/10/20 to assess client needs     Norva Riffle.Talor Desrosiers MSW, LCSW Licensed Clinical Social Worker [facility] Care Management [phone]

## 2020-11-02 NOTE — Chronic Care Management (AMB) (Signed)
Chronic Care Management    Clinical Social Work Note  11/02/2020 Name: Logan Price MRN: [id number] DOB: 01/28/1946  Logan Price is a 75 y.o. year old male who is a primary care patient of [name], FNP. The CCM team was consulted to assist the patient with chronic disease management and/or care coordination needs related to: [name] .   Engaged with patient by telephone for follow up visit in response to provider referral for social work chronic care management and care coordination services.   Consent to Services:  The patient was given information about Chronic Care Management services, agreed to services, and gave verbal consent prior to initiation of services.  Please see initial visit note for detailed documentation.   Patient agreed to services and consent obtained.   Assessment: Review of patient past medical history, allergies, medications, and health status, including review of relevant consultants reports was performed today as part of a comprehensive evaluation and provision of chronic care management and care coordination services.     SDOH (Social Determinants of Health) assessments and interventions performed:  SDOH Interventions    Flowsheet Row Most Recent Value  SDOH Interventions   Physical Activity Interventions Other (Comments)  [client is participating in Physical Therapy Outpatient Sessions weekly as scheduled]  Depression Interventions/Treatment  --  [informed client of LCSW support and of RNCM support]        Advanced Directives Status: See [employer]yn[employer] application for related entries.  CCM Care Plan  No Known Allergies  Outpatient Encounter Medications as of 11/02/2020  Medication Sig   doxazosin (CARDURA) 1 MG tablet Place 1 tablet (1 mg total) into feeding tube daily. (Patient taking differently: Take 1 mg by mouth daily.)   pantoprazole (PROTONIX) 40 MG tablet TAKE ONE TABLET DAILY 30 MINUTES PRIOR TO THE FIRST MEAL   simvastatin (ZOCOR)  20 MG tablet TAKE 1 TABLET DAILY AT 6PM   No facility-administered encounter medications on file as of 11/02/2020.    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   Pleural effusion    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HCC)    Normocytic anemia    PSVT (paroxysmal supraventricular tachycardia) (HCC) 10/03/2018   Atrial fibrillation (HCC) 10/03/2018   Dysphagia    Loss of weight    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 Monitor client completion of ADLs daily, as he is able   Care Plan : LCSW care plan  Updates made by Isaiah Blakes, LCSW since 11/02/2020 12:00 AM     Problem: Coping  Skills (General Plan of Care)      Goal: Manage mobility issues, manage pain issues and work to complete ADLs daily   Start Date: 11/02/2020  Expected End Date: 02/02/2021  This Visit's Progress: On track  Recent Progress: On track  Priority: Medium  Note:   Current barriers:   Patient in need of assistance with connecting to community  resources for helping client find needed resources to help him with managing mobility and ADLs completion Patient is unable to independently navigate community resource options without care coordination support Adenocarcinoma of Left Lung Pain issues  Clinical Goals:  Patient will communicate with LCSW in next 30 days to discuss community resources of possible help to client Patient to communicate with LCSW in next 30 days to discuss client mobility and pain issues of client Patient to communicate with RNCM  in next 30 days as needed to discuss nursing needs of client  Clinical Interventions:  Collaboration with [name], FNP regarding development and update of comprehensive plan of care as evidenced by provider attestation and co-signature Talked with client about pain issues of client (client has pain in his back) Talked with client about mobility (he uses a cane as needed to walk) Talked with Rico Ala about his upcoming appointment with Dr. Basilio Cairo  on November 23, 2020 and his appointment with Dr. Christain Sacramento in September of 2022 Talked with client about appetite of client Talked with client about sleeping issues of client Talked with client about medication procurement of client Talked with client about client completion of ADLs Talked with client about relaxation techniques (watches TV to help him relax, likes to be outdoors occasionally) Encouraged client to call RNCM as needed for nursing support Talked with client about chewing and swallowing food. He said he chews his food slowly and swallows food . He said occasionally he has some difficulty in swallowing.   Talked with client about mood of client. Client said he was doing well with his mood. He did not speak of any depression or anxiety symptoms Talked with client about ambulation of client. He said he walks with use of a cane.He said he is currently receiving physical therapy outpatient sessions weekly as scheduled in [place],  [place] Talked with client about energy level of client  Patient Strengths:  Attends scheduled medical appointments Takes medications as prescribed Has support from spouse Drives to needed appointments  Patient Deficits:  Pain issues and mobility issues  Patient Goals:   To attend scheduled medical appointments To take medications as prescribed To communicate regularly with his spouse to discuss client needs To communicate with RNCM or LCSW as needed for CCM support To participate in physical therapy sessions as scheduled -  Follow Up Plan: LCSW to call client or spouse of client on 12/10/20 to assess client needs      Kelton Pillar.Desarie Feild MSW, LCSW Licensed Clinical Social Worker [facility] Care Management [phone]

## 2020-11-04 ENCOUNTER — Ambulatory Visit (HOSPITAL_COMMUNITY): Payer: Medicare Other

## 2020-11-04 ENCOUNTER — Telehealth: Payer: Self-pay | Admitting: Family

## 2020-11-04 ENCOUNTER — Encounter (HOSPITAL_COMMUNITY): Payer: Self-pay

## 2020-11-04 ENCOUNTER — Other Ambulatory Visit: Payer: Self-pay

## 2020-11-04 DIAGNOSIS — G8929 Other chronic pain: Secondary | ICD-10-CM | POA: Diagnosis not present

## 2020-11-04 DIAGNOSIS — M545 Low back pain, unspecified: Secondary | ICD-10-CM

## 2020-11-04 DIAGNOSIS — R2689 Other abnormalities of gait and mobility: Secondary | ICD-10-CM | POA: Diagnosis not present

## 2020-11-04 DIAGNOSIS — M6281 Muscle weakness (generalized): Secondary | ICD-10-CM

## 2020-11-04 NOTE — Patient Instructions (Addendum)
Bridging    Slowly raise buttocks from floor, keeping stomach tight. Repeat 10 times per set. Do 2 sets per session. Do ____ sessions per day.  http://orth.exer.us/1096   Copyright  VHI. All rights reserved.   Abduction: Side Leg Lift (Eccentric) - Side-Lying    Lie on side. Lift top leg slightly higher than shoulder level. Keep top leg straight with body, toes pointing forward.  Slowly lower for 3-5 seconds. 10 reps per set, 2 sets per day, 4 days per week.   http://ecce.exer.us/62   Copyright  VHI. All rights reserved.    Straight Leg Raise (Prone)    Abdomen and head supported, keep left knee locked and raise leg at hip. Avoid arching low back. Repeat 10 times per set. Do 2 sets per session. Do ____ sessions per day.  http://orth.exer.us/1112   Copyright  VHI. All rights reserved.   On Elbows (Prone)    Rise up on elbows as high as possible, keeping hips on floor. Hold 2 minutes Repeat 1 times per set. Do ____ sets per session. Do 2-3 sessions per day.  http://orth.exer.us/92   Copyright  VHI. All rights reserved.

## 2020-11-04 NOTE — Telephone Encounter (Signed)
Patient aware and verbalized understanding. °

## 2020-11-04 NOTE — Telephone Encounter (Signed)
Pt can take either of these.

## 2020-11-04 NOTE — Therapy (Signed)
strength, postural strength, functional strength; transition to self management with HEP    PT Home Exercise Plan 8/1 marching, LAQ, STS; 8/11: prone, POE, hip extension, abd and bridges    Consulted  and Agree with Plan of Care Patient;Family member/caregiver    Family Member Consulted wife [name]             Patient will benefit from skilled therapeutic intervention in order to improve the following deficits and impairments:  Abnormal gait, Decreased range of motion, Decreased endurance, Decreased activity tolerance, Decreased balance, Decreased mobility, Decreased strength, Postural dysfunction, Improper body mechanics, Impaired flexibility, Pain  Visit Diagnosis: Other abnormalities of gait and mobility  Chronic bilateral low back pain, unspecified whether sciatica present  Muscle weakness (generalized)     Problem List Patient Active Problem List   Diagnosis Date Noted   Low back pain 03/10/2020   Malignant neoplasm of right upper lobe of lung ([place]) 05/27/2019   Primary cancer of left upper lobe of lung ([place]) 05/27/2019   Tracheobronchitis 05/21/2019   Hx of radiation therapy 05/21/2019   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 [place], [name], [address] Phone: [phone]   Fax:  [phone]  Name: Logan Price MRN: [id number] Date of Birth: [birth year]  [place], [name], [address] Phone: [phone]   Fax:  [phone]  Physical Therapy Treatment  Patient Details  Name: Logan Price MRN: [id number] Date of Birth: 10/13/1945 Referring Provider (PT): Evelina Dun FNP   Encounter Date: 11/04/2020   PT End of Session - 11/04/20 1101     Visit Number 2    Number of Visits 8    Date for PT Re-Evaluation 11/22/20    Authorization Type Primary Medicare Secondary general commercial    PT Start Time 1050    PT Stop Time 1128    PT Time Calculation (min) 38 min    Activity Tolerance Patient tolerated treatment well    Behavior During Therapy [facility] for tasks assessed/performed             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   HTN (hypertension)    Hyperlipidemia     Past Surgical History:  Procedure Laterality Date   BIOPSY  05/04/2015   Procedure: BIOPSY;  Surgeon: Danie Binder, MD;  [facility];  Service: Endoscopy;;  bx's of ileocecal valve    BIOPSY  06/22/2020   Procedure: BIOPSY;  Surgeon: Eloise Harman, DO;  [facility];  Service: Endoscopy;;   BRONCHIAL BRUSHINGS  04/29/2019   Procedure: BRONCHIAL BRUSHINGS;  Surgeon: Garner Nash, DO;  Location: [facility];  Service: Pulmonary;;   BRONCHIAL NEEDLE ASPIRATION BIOPSY  04/29/2019   Procedure: BRONCHIAL NEEDLE ASPIRATION BIOPSIES;  Surgeon: Garner Nash, DO;  Location: [facility];  Service: Pulmonary;;   BRONCHIAL WASHINGS  04/29/2019   Procedure: BRONCHIAL WASHINGS;  Surgeon: Garner Nash, DO;  [facility];  Service: Pulmonary;;   COLONOSCOPY WITH PROPOFOL N/A 05/04/2015   Dr. Oneida Alar: normal appearing ileum with prominent IC valve with tubular  adenomas, moderate diverticulosis in sigmoid colon, ascending colon, and retum. Moderate sized internal hemorrhoids. Surveillance in 5 years   COLONOSCOPY WITH PROPOFOL N/A 06/22/2020   Procedure: COLONOSCOPY WITH PROPOFOL;  Surgeon: Eloise Harman, DO;  [facility];  Service: Endoscopy;  Laterality: N/A;  PM   ELECTROMAGNETIC NAVIGATION BROCHOSCOPY  04/29/2019   Procedure: NAVIGATION BRONCHOSCOPY;  Surgeon: Garner Nash, DO;  Location: [facility];  Service: Pulmonary;;   ESOPHAGOGASTRODUODENOSCOPY (EGD) WITH PROPOFOL N/A 12/19/2016   Procedure: ESOPHAGOGASTRODUODENOSCOPY (EGD) WITH PROPOFOL;  Surgeon: Danie Binder, MD;  [facility];  Service: Endoscopy;  Laterality: N/A;  11:30am   FLEXIBLE SIGMOIDOSCOPY N/A 12/10/2015   hemorrhoid banding X 3    HEMORRHOID BANDING N/A 12/10/2015   Procedure: HEMORRHOID BANDING;  Surgeon: Danie Binder, MD;  [facility];  Service: Endoscopy;  Laterality: N/A;  1:30 PM   INSERTION OF SUPRAPUBIC CATHETER N/A 02/24/2019   Procedure: INSERTION OF SUPRAPUBIC CATHETER;  Surgeon: Kathie Rhodes, MD;  [facility];  Service: Urology;  Laterality: N/A;   IR CM INJ ANY COLONIC TUBE W/FLUORO  10/30/2018   IR GASTROSTOMY TUBE MOD SED  10/04/2018   IR GASTROSTOMY TUBE REMOVAL  10/08/2019   IR IMAGING GUIDED PORT INSERTION  10/04/2018   IR REMOVAL TUN ACCESS W/ PORT W/O FL MOD SED  10/14/2018   IR REPLACE G-TUBE SIMPLE  strength, postural strength, functional strength; transition to self management with HEP    PT Home Exercise Plan 8/1 marching, LAQ, STS; 8/11: prone, POE, hip extension, abd and bridges    Consulted  and Agree with Plan of Care Patient;Family member/caregiver    Family Member Consulted wife [name]             Patient will benefit from skilled therapeutic intervention in order to improve the following deficits and impairments:  Abnormal gait, Decreased range of motion, Decreased endurance, Decreased activity tolerance, Decreased balance, Decreased mobility, Decreased strength, Postural dysfunction, Improper body mechanics, Impaired flexibility, Pain  Visit Diagnosis: Other abnormalities of gait and mobility  Chronic bilateral low back pain, unspecified whether sciatica present  Muscle weakness (generalized)     Problem List Patient Active Problem List   Diagnosis Date Noted   Low back pain 03/10/2020   Malignant neoplasm of right upper lobe of lung ([place]) 05/27/2019   Primary cancer of left upper lobe of lung ([place]) 05/27/2019   Tracheobronchitis 05/21/2019   Hx of radiation therapy 05/21/2019   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 [place], [name], [address] Phone: [phone]   Fax:  [phone]  Name: Logan Price MRN: [id number] Date of Birth: [birth year]  strength, postural strength, functional strength; transition to self management with HEP    PT Home Exercise Plan 8/1 marching, LAQ, STS; 8/11: prone, POE, hip extension, abd and bridges    Consulted  and Agree with Plan of Care Patient;Family member/caregiver    Family Member Consulted wife [name]             Patient will benefit from skilled therapeutic intervention in order to improve the following deficits and impairments:  Abnormal gait, Decreased range of motion, Decreased endurance, Decreased activity tolerance, Decreased balance, Decreased mobility, Decreased strength, Postural dysfunction, Improper body mechanics, Impaired flexibility, Pain  Visit Diagnosis: Other abnormalities of gait and mobility  Chronic bilateral low back pain, unspecified whether sciatica present  Muscle weakness (generalized)     Problem List Patient Active Problem List   Diagnosis Date Noted   Low back pain 03/10/2020   Malignant neoplasm of right upper lobe of lung ([place]) 05/27/2019   Primary cancer of left upper lobe of lung ([place]) 05/27/2019   Tracheobronchitis 05/21/2019   Hx of radiation therapy 05/21/2019   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 [place], [name], [address] Phone: [phone]   Fax:  [phone]  Name: Logan Price MRN: [id number] Date of Birth: [birth year]

## 2020-11-08 ENCOUNTER — Telehealth: Payer: Self-pay | Admitting: *Deleted

## 2020-11-08 ENCOUNTER — Ambulatory Visit (HOSPITAL_COMMUNITY): Payer: Medicare Other

## 2020-11-08 ENCOUNTER — Other Ambulatory Visit: Payer: Self-pay

## 2020-11-08 ENCOUNTER — Encounter (HOSPITAL_COMMUNITY): Payer: Self-pay

## 2020-11-08 DIAGNOSIS — R2689 Other abnormalities of gait and mobility: Secondary | ICD-10-CM

## 2020-11-08 DIAGNOSIS — G8929 Other chronic pain: Secondary | ICD-10-CM | POA: Diagnosis not present

## 2020-11-08 DIAGNOSIS — M6281 Muscle weakness (generalized): Secondary | ICD-10-CM

## 2020-11-08 DIAGNOSIS — M545 Low back pain, unspecified: Secondary | ICD-10-CM

## 2020-11-08 NOTE — Telephone Encounter (Signed)
CALLED PATIENT TO INFORM OF CT FOR 11-22-20- ARRIVAL TIME- 10:15 AM @ [facility], NO RESTRICTIONS TO TEST, PATIENT TO RECEIVE RESULTS FROM DR. SQUIRE ON 11-23-20 @ 2 PM, PATIENT VERIFIED UNDERSTANDING THESE APPTS.

## 2020-11-08 NOTE — Therapy (Addendum)
[place], [name], [address] Phone: [phone]   Fax:  [phone]  Physical Therapy Treatment  Patient Details  Name: Logan Price MRN: [id number] Date of Birth: 04-19-1945 Referring Provider (PT): Evelina Dun FNP   Encounter Date: 11/08/2020   PT End of Session - 11/08/20 1302     Visit Number 3    Number of Visits 8    Date for PT Re-Evaluation 11/22/20    Authorization Type Primary Medicare Secondary general commercial    PT Start Time 1303    PT Stop Time 1346    PT Time Calculation (min) 43 min    Activity Tolerance Patient tolerated treatment well    Behavior During Therapy [facility] for tasks assessed/performed             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   HTN (hypertension)    Hyperlipidemia     Past Surgical History:  Procedure Laterality Date   BIOPSY  05/04/2015   Procedure: BIOPSY;  Surgeon: Danie Binder, MD;  [facility];  Service: Endoscopy;;  bx's of ileocecal valve    BIOPSY  06/22/2020   Procedure: BIOPSY;  Surgeon: Eloise Harman, DO;  [facility];  Service: Endoscopy;;   BRONCHIAL BRUSHINGS  04/29/2019   Procedure: BRONCHIAL BRUSHINGS;  Surgeon: Garner Nash, DO;  Location: [facility];  Service: Pulmonary;;   BRONCHIAL NEEDLE ASPIRATION BIOPSY  04/29/2019   Procedure: BRONCHIAL NEEDLE ASPIRATION BIOPSIES;  Surgeon: Garner Nash, DO;  Location: [facility];  Service: Pulmonary;;   BRONCHIAL WASHINGS  04/29/2019   Procedure: BRONCHIAL WASHINGS;  Surgeon: Garner Nash, DO;  [facility];  Service: Pulmonary;;   COLONOSCOPY WITH PROPOFOL N/A 05/04/2015   Dr. Oneida Alar: normal appearing ileum with prominent IC valve with tubular  adenomas, moderate diverticulosis in sigmoid colon, ascending colon, and retum. Moderate sized internal hemorrhoids. Surveillance in 5 years   COLONOSCOPY WITH PROPOFOL N/A 06/22/2020   Procedure: COLONOSCOPY WITH PROPOFOL;  Surgeon: Eloise Harman, DO;  [facility];  Service: Endoscopy;  Laterality: N/A;  PM   ELECTROMAGNETIC NAVIGATION BROCHOSCOPY  04/29/2019   Procedure: NAVIGATION BRONCHOSCOPY;  Surgeon: Garner Nash, DO;  Location: [facility];  Service: Pulmonary;;   ESOPHAGOGASTRODUODENOSCOPY (EGD) WITH PROPOFOL N/A 12/19/2016   Procedure: ESOPHAGOGASTRODUODENOSCOPY (EGD) WITH PROPOFOL;  Surgeon: Danie Binder, MD;  [facility];  Service: Endoscopy;  Laterality: N/A;  11:30am   FLEXIBLE SIGMOIDOSCOPY N/A 12/10/2015   hemorrhoid banding X 3    HEMORRHOID BANDING N/A 12/10/2015   Procedure: HEMORRHOID BANDING;  Surgeon: Danie Binder, MD;  [facility];  Service: Endoscopy;  Laterality: N/A;  1:30 PM   INSERTION OF SUPRAPUBIC CATHETER N/A 02/24/2019   Procedure: INSERTION OF SUPRAPUBIC CATHETER;  Surgeon: Kathie Rhodes, MD;  [facility];  Service: Urology;  Laterality: N/A;   IR CM INJ ANY COLONIC TUBE W/FLUORO  10/30/2018   IR GASTROSTOMY TUBE MOD SED  10/04/2018   IR GASTROSTOMY TUBE REMOVAL  10/08/2019   IR IMAGING GUIDED PORT INSERTION  10/04/2018   IR REMOVAL TUN ACCESS W/ PORT W/O FL MOD SED  10/14/2018   IR REPLACE G-TUBE SIMPLE  order to improve  the following deficits and impairments:  Abnormal gait, Decreased range of motion, Decreased endurance, Decreased activity tolerance, Decreased balance, Decreased mobility, Decreased strength, Postural dysfunction, Improper body mechanics, Impaired flexibility, Pain  Visit Diagnosis: Other abnormalities of gait and mobility  Chronic bilateral low back pain, unspecified whether sciatica present  Muscle weakness (generalized)     Problem List Patient Active Problem List   Diagnosis Date Noted   Low back pain 03/10/2020   Malignant neoplasm of right upper lobe of lung ([place]) 05/27/2019   Primary cancer of left upper lobe of lung ([place]) 05/27/2019   Tracheobronchitis 05/21/2019   Hx of radiation therapy 05/21/2019   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[place]) 05/21/2019   Squamous cell lung cancer, 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   Adenocarcinoma, lung, left ([place]) 05/21/2019   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   Pleural effusion    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HCC)    Normocytic anemia    PSVT (paroxysmal supraventricular tachycardia) ([place]) 10/03/2018   Atrial fibrillation ([place]) 10/03/2018   Dysphagia    Loss of weight    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 Hartnett-Rands, MS, PT Per [place] [phone] Jeannie Done 11/08/2020, 1:59 PM  [facility] [address] [place], [name], [address] Phone: [phone]   Fax:   [phone]  Name: Logan Price MRN: [id number] Date of Birth: 1945/10/22  [place], [name], [address] Phone: [phone]   Fax:  [phone]  Physical Therapy Treatment  Patient Details  Name: Logan Price MRN: [id number] Date of Birth: 04-19-1945 Referring Provider (PT): Evelina Dun FNP   Encounter Date: 11/08/2020   PT End of Session - 11/08/20 1302     Visit Number 3    Number of Visits 8    Date for PT Re-Evaluation 11/22/20    Authorization Type Primary Medicare Secondary general commercial    PT Start Time 1303    PT Stop Time 1346    PT Time Calculation (min) 43 min    Activity Tolerance Patient tolerated treatment well    Behavior During Therapy [facility] for tasks assessed/performed             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   HTN (hypertension)    Hyperlipidemia     Past Surgical History:  Procedure Laterality Date   BIOPSY  05/04/2015   Procedure: BIOPSY;  Surgeon: Danie Binder, MD;  [facility];  Service: Endoscopy;;  bx's of ileocecal valve    BIOPSY  06/22/2020   Procedure: BIOPSY;  Surgeon: Eloise Harman, DO;  [facility];  Service: Endoscopy;;   BRONCHIAL BRUSHINGS  04/29/2019   Procedure: BRONCHIAL BRUSHINGS;  Surgeon: Garner Nash, DO;  Location: [facility];  Service: Pulmonary;;   BRONCHIAL NEEDLE ASPIRATION BIOPSY  04/29/2019   Procedure: BRONCHIAL NEEDLE ASPIRATION BIOPSIES;  Surgeon: Garner Nash, DO;  Location: [facility];  Service: Pulmonary;;   BRONCHIAL WASHINGS  04/29/2019   Procedure: BRONCHIAL WASHINGS;  Surgeon: Garner Nash, DO;  [facility];  Service: Pulmonary;;   COLONOSCOPY WITH PROPOFOL N/A 05/04/2015   Dr. Oneida Alar: normal appearing ileum with prominent IC valve with tubular  adenomas, moderate diverticulosis in sigmoid colon, ascending colon, and retum. Moderate sized internal hemorrhoids. Surveillance in 5 years   COLONOSCOPY WITH PROPOFOL N/A 06/22/2020   Procedure: COLONOSCOPY WITH PROPOFOL;  Surgeon: Eloise Harman, DO;  [facility];  Service: Endoscopy;  Laterality: N/A;  PM   ELECTROMAGNETIC NAVIGATION BROCHOSCOPY  04/29/2019   Procedure: NAVIGATION BRONCHOSCOPY;  Surgeon: Garner Nash, DO;  Location: [facility];  Service: Pulmonary;;   ESOPHAGOGASTRODUODENOSCOPY (EGD) WITH PROPOFOL N/A 12/19/2016   Procedure: ESOPHAGOGASTRODUODENOSCOPY (EGD) WITH PROPOFOL;  Surgeon: Danie Binder, MD;  [facility];  Service: Endoscopy;  Laterality: N/A;  11:30am   FLEXIBLE SIGMOIDOSCOPY N/A 12/10/2015   hemorrhoid banding X 3    HEMORRHOID BANDING N/A 12/10/2015   Procedure: HEMORRHOID BANDING;  Surgeon: Danie Binder, MD;  [facility];  Service: Endoscopy;  Laterality: N/A;  1:30 PM   INSERTION OF SUPRAPUBIC CATHETER N/A 02/24/2019   Procedure: INSERTION OF SUPRAPUBIC CATHETER;  Surgeon: Kathie Rhodes, MD;  [facility];  Service: Urology;  Laterality: N/A;   IR CM INJ ANY COLONIC TUBE W/FLUORO  10/30/2018   IR GASTROSTOMY TUBE MOD SED  10/04/2018   IR GASTROSTOMY TUBE REMOVAL  10/08/2019   IR IMAGING GUIDED PORT INSERTION  10/04/2018   IR REMOVAL TUN ACCESS W/ PORT W/O FL MOD SED  10/14/2018   IR REPLACE G-TUBE SIMPLE  [place], [name], [address] Phone: [phone]   Fax:  [phone]  Physical Therapy Treatment  Patient Details  Name: Logan Price MRN: [id number] Date of Birth: 04-19-1945 Referring Provider (PT): Evelina Dun FNP   Encounter Date: 11/08/2020   PT End of Session - 11/08/20 1302     Visit Number 3    Number of Visits 8    Date for PT Re-Evaluation 11/22/20    Authorization Type Primary Medicare Secondary general commercial    PT Start Time 1303    PT Stop Time 1346    PT Time Calculation (min) 43 min    Activity Tolerance Patient tolerated treatment well    Behavior During Therapy [facility] for tasks assessed/performed             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   HTN (hypertension)    Hyperlipidemia     Past Surgical History:  Procedure Laterality Date   BIOPSY  05/04/2015   Procedure: BIOPSY;  Surgeon: Danie Binder, MD;  [facility];  Service: Endoscopy;;  bx's of ileocecal valve    BIOPSY  06/22/2020   Procedure: BIOPSY;  Surgeon: Eloise Harman, DO;  [facility];  Service: Endoscopy;;   BRONCHIAL BRUSHINGS  04/29/2019   Procedure: BRONCHIAL BRUSHINGS;  Surgeon: Garner Nash, DO;  Location: [place];  Service: Pulmonary;;   BRONCHIAL NEEDLE ASPIRATION BIOPSY  04/29/2019   Procedure: BRONCHIAL NEEDLE ASPIRATION BIOPSIES;  Surgeon: Garner Nash, DO;  Location: [place];  Service: Pulmonary;;   BRONCHIAL WASHINGS  04/29/2019   Procedure: BRONCHIAL WASHINGS;  Surgeon: Garner Nash, DO;  [facility];  Service: Pulmonary;;   COLONOSCOPY WITH PROPOFOL N/A 05/04/2015   Dr. Oneida Alar: normal appearing ileum with prominent IC valve with tubular  adenomas, moderate diverticulosis in sigmoid colon, ascending colon, and retum. Moderate sized internal hemorrhoids. Surveillance in 5 years   COLONOSCOPY WITH PROPOFOL N/A 06/22/2020   Procedure: COLONOSCOPY WITH PROPOFOL;  Surgeon: Eloise Harman, DO;  [facility];  Service: Endoscopy;  Laterality: N/A;  PM   ELECTROMAGNETIC NAVIGATION BROCHOSCOPY  04/29/2019   Procedure: NAVIGATION BRONCHOSCOPY;  Surgeon: Garner Nash, DO;  Location: [facility];  Service: Pulmonary;;   ESOPHAGOGASTRODUODENOSCOPY (EGD) WITH PROPOFOL N/A 12/19/2016   Procedure: ESOPHAGOGASTRODUODENOSCOPY (EGD) WITH PROPOFOL;  Surgeon: Danie Binder, MD;  [facility];  Service: Endoscopy;  Laterality: N/A;  11:30am   FLEXIBLE SIGMOIDOSCOPY N/A 12/10/2015   hemorrhoid banding X 3    HEMORRHOID BANDING N/A 12/10/2015   Procedure: HEMORRHOID BANDING;  Surgeon: Danie Binder, MD;  [facility];  Service: Endoscopy;  Laterality: N/A;  1:30 PM   INSERTION OF SUPRAPUBIC CATHETER N/A 02/24/2019   Procedure: INSERTION OF SUPRAPUBIC CATHETER;  Surgeon: Kathie Rhodes, MD;  [facility];  Service: Urology;  Laterality: N/A;   IR CM INJ ANY COLONIC TUBE W/FLUORO  10/30/2018   IR GASTROSTOMY TUBE MOD SED  10/04/2018   IR GASTROSTOMY TUBE REMOVAL  10/08/2019   IR IMAGING GUIDED PORT INSERTION  10/04/2018   IR REMOVAL TUN ACCESS W/ PORT W/O FL MOD SED  10/14/2018   IR REPLACE G-TUBE SIMPLE

## 2020-11-08 NOTE — Patient Instructions (Signed)
Sit-to-Stand Exercise  The sit-to-stand exercise (also known as the chair stand or chair rise exercise) strengthens your lower body and helps you maintain or improve your mobility and independence. The goal is to do the sit-to-stand exercise without using your hands. This will be easier as you become stronger. You should always talk with your health care provider before starting any exercise program, especiallyif you have had recent surgery. Do the exercise exactly as told by your health care provider and adjust it as directed. It is normal to feel mild stretching, pulling, tightness, or discomfort as you do this exercise, but you should stop right away if you feel sudden pain or your pain gets worse. Do not begin doing this exercise until told by your health care provider. What the sit-to-stand exercise does The sit-to-stand exercise helps to strengthen the muscles in your thighs and the muscles in the center of your body that give you stability (core muscles). This exercise is especially helpful if: You have had knee or hip surgery. You have trouble getting up from a chair, out of a car, or off the toilet. How to do the sit-to-stand exercise Sit toward the front edge of a sturdy chair without armrests. Your knees should be bent and your feet should be flat on the floor and shoulder-width apart. Place your hands lightly on each side of the seat. Keep your back and neck as straight as possible, with your chest slightly forward. Breathe in slowly. Lean forward and slightly shift your weight to the front of your feet. Breathe out as you slowly stand up. Use your hands as little as possible. Stand and pause for a full breath in and out. Breathe in as you sit down slowly. Tighten your core and abdominal muscles to control your lowering as much as possible. Breathe out slowly. Do this exercise 10-15 times. If needed, do it fewer times until you build up strength. Rest for 1 minute, then do another set of  10-15 repetitions. To change the difficulty of the sit-to-stand exercise If the exercise is too difficult, use a chair with sturdy armrests, and push off the armrests to help you come to the standing position. You can also use the armrests to help slowly lower yourself back to sitting. As this gets easier, try to use your arms less. You can also place a firm cushion or pillow on the chair to make the surface higher. If this exercise is too easy, do not use your arms to help raise or lower yourself. You can also wear a weighted vest, use hand weights, increase your repetitions, or try a lower chair. General tips You may feel tired when starting an exercise routine. This is normal. You may have muscle soreness that lasts a few days. This is normal. As you get stronger, you may not feel muscle soreness. Use smooth, steady movements. Do not  hold your breath during strength exercises. This can cause unsafe changes in your blood pressure. Breathe in slowly through your nose, and breathe out slowly through your mouth. Summary Strengthening your lower body is an important step to help you move safely and independently. The sit-to-stand exercise helps strengthen the muscles in your thighs and core. You should always talk with your health care provider before starting any exercise program, especially if you have had recent surgery. This information is not intended to replace advice given to you by your health care provider. Make sure you discuss any questions you have with your healthcare provider. Document  Revised: 02/27/2020 Document Reviewed: 02/27/2020 Elsevier Patient Education  [place].

## 2020-11-11 ENCOUNTER — Encounter (HOSPITAL_COMMUNITY): Payer: Self-pay | Admitting: Physical Therapy

## 2020-11-11 ENCOUNTER — Ambulatory Visit (HOSPITAL_COMMUNITY): Payer: Medicare Other | Admitting: Physical Therapy

## 2020-11-11 ENCOUNTER — Other Ambulatory Visit: Payer: Self-pay

## 2020-11-11 DIAGNOSIS — R2689 Other abnormalities of gait and mobility: Secondary | ICD-10-CM

## 2020-11-11 DIAGNOSIS — M545 Low back pain, unspecified: Secondary | ICD-10-CM

## 2020-11-11 DIAGNOSIS — M6281 Muscle weakness (generalized): Secondary | ICD-10-CM | POA: Diagnosis not present

## 2020-11-11 DIAGNOSIS — G8929 Other chronic pain: Secondary | ICD-10-CM | POA: Diagnosis not present

## 2020-11-11 NOTE — Therapy (Signed)
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   Squamous cell lung cancer, 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   Adenocarcinoma, lung, left ([place]) 05/21/2019   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   Pleural effusion    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[facility])    Normocytic anemia    PSVT (paroxysmal supraventricular tachycardia) ([place]) 10/03/2018   Atrial fibrillation ([place]) 10/03/2018   Dysphagia    Loss of weight    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   Gastritis determined by endoscopy    Esophageal dysphagia    False positive serological test for hepatitis C 12/13/2016   Globus sensation 12/12/2016   Smokes with greater than 40 pack year history 10/20/2016   Hemorrhoids, internal, with bleeding 10/06/2015   History of colonic polyps 04/14/2015   Vitamin D deficiency 10/09/2014   HLD (hyperlipidemia) 10/08/2012   GERD (gastroesophageal reflux disease) 10/08/2012   HTN (hypertension)    Diverticulitis 10/25/2009    3:31 PM, 11/11/20 Mearl Latin PT, DPT Physical Therapist at [facility] [address] [place], [name], [address] Phone: [phone]   Fax:  [phone]  Name: Logan Price MRN: [id number] Date of Birth: 1945-09-07  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   Squamous cell lung cancer, 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   Adenocarcinoma, lung, left ([place]) 05/21/2019   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   Pleural effusion    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HCC)    Normocytic anemia    PSVT (paroxysmal supraventricular tachycardia) ([place]) 10/03/2018   Atrial fibrillation ([place]) 10/03/2018   Dysphagia    Loss of weight    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   Gastritis determined by endoscopy    Esophageal dysphagia    False positive serological test for hepatitis C 12/13/2016   Globus sensation 12/12/2016   Smokes with greater than 40 pack year history 10/20/2016   Hemorrhoids, internal, with bleeding 10/06/2015   History of colonic polyps 04/14/2015   Vitamin D deficiency 10/09/2014   HLD (hyperlipidemia) 10/08/2012   GERD (gastroesophageal reflux disease) 10/08/2012   HTN (hypertension)    Diverticulitis 10/25/2009    3:31 PM, 11/11/20 Mearl Latin PT, DPT Physical Therapist at [facility] [address] [place], [name], [address] Phone: [phone]   Fax:  [phone]  Name: Logan Price MRN: [id number] Date of Birth: 1945-09-07  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   Squamous cell lung cancer, 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   Adenocarcinoma, lung, left ([place]) 05/21/2019   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   Pleural effusion    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HCC)    Normocytic anemia    PSVT (paroxysmal supraventricular tachycardia) ([place]) 10/03/2018   Atrial fibrillation ([place]) 10/03/2018   Dysphagia    Loss of weight    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   Gastritis determined by endoscopy    Esophageal dysphagia    False positive serological test for hepatitis C 12/13/2016   Globus sensation 12/12/2016   Smokes with greater than 40 pack year history 10/20/2016   Hemorrhoids, internal, with bleeding 10/06/2015   History of colonic polyps 04/14/2015   Vitamin D deficiency 10/09/2014   HLD (hyperlipidemia) 10/08/2012   GERD (gastroesophageal reflux disease) 10/08/2012   HTN (hypertension)    Diverticulitis 10/25/2009    3:31 PM, 11/11/20 Mearl Latin PT, DPT Physical Therapist at [facility] [address] [place], [name], [address] Phone: [phone]   Fax:  [phone]  Name: Logan Price MRN: [id number] Date of Birth: 1945-09-07  [place], [name], [address] Phone: [phone]   Fax:  [phone]  Physical Therapy Treatment  Patient Details  Name: Logan Price MRN: [id number] Date of Birth: 05-08-1945 Referring Provider (PT): Evelina Dun FNP   Encounter Date: 11/11/2020   PT End of Session - 11/11/20 1450     Visit Number 4    Number of Visits 8    Date for PT Re-Evaluation 11/22/20    Authorization Type Primary Medicare Secondary general commercial    PT Start Time 1450    PT Stop Time 1530    PT Time Calculation (min) 40 min    Activity Tolerance Patient tolerated treatment well    Behavior During Therapy [facility] for tasks assessed/performed             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   HTN (hypertension)    Hyperlipidemia     Past Surgical History:  Procedure Laterality Date   BIOPSY  05/04/2015   Procedure: BIOPSY;  Surgeon: Danie Binder, MD;  [facility];  Service: Endoscopy;;  bx's of ileocecal valve    BIOPSY  06/22/2020   Procedure: BIOPSY;  Surgeon: Eloise Harman, DO;  [facility];  Service: Endoscopy;;   BRONCHIAL BRUSHINGS  04/29/2019   Procedure: BRONCHIAL BRUSHINGS;  Surgeon: Garner Nash, DO;  Location: [facility];  Service: Pulmonary;;   BRONCHIAL NEEDLE ASPIRATION BIOPSY  04/29/2019   Procedure: BRONCHIAL NEEDLE ASPIRATION BIOPSIES;  Surgeon: Garner Nash, DO;  Location: [facility];  Service: Pulmonary;;   BRONCHIAL WASHINGS  04/29/2019   Procedure: BRONCHIAL WASHINGS;  Surgeon: Garner Nash, DO;  [facility];  Service: Pulmonary;;   COLONOSCOPY WITH PROPOFOL N/A 05/04/2015   Dr. Oneida Alar: normal appearing ileum with prominent IC valve with tubular  adenomas, moderate diverticulosis in sigmoid colon, ascending colon, and retum. Moderate sized internal hemorrhoids. Surveillance in 5 years   COLONOSCOPY WITH PROPOFOL N/A 06/22/2020   Procedure: COLONOSCOPY WITH PROPOFOL;  Surgeon: Eloise Harman, DO;  [facility];  Service: Endoscopy;  Laterality: N/A;  PM   ELECTROMAGNETIC NAVIGATION BROCHOSCOPY  04/29/2019   Procedure: NAVIGATION BRONCHOSCOPY;  Surgeon: Garner Nash, DO;  Location: [facility];  Service: Pulmonary;;   ESOPHAGOGASTRODUODENOSCOPY (EGD) WITH PROPOFOL N/A 12/19/2016   Procedure: ESOPHAGOGASTRODUODENOSCOPY (EGD) WITH PROPOFOL;  Surgeon: Danie Binder, MD;  [facility];  Service: Endoscopy;  Laterality: N/A;  11:30am   FLEXIBLE SIGMOIDOSCOPY N/A 12/10/2015   hemorrhoid banding X 3    HEMORRHOID BANDING N/A 12/10/2015   Procedure: HEMORRHOID BANDING;  Surgeon: Danie Binder, MD;  [facility];  Service: Endoscopy;  Laterality: N/A;  1:30 PM   INSERTION OF SUPRAPUBIC CATHETER N/A 02/24/2019   Procedure: INSERTION OF SUPRAPUBIC CATHETER;  Surgeon: Kathie Rhodes, MD;  [facility];  Service: Urology;  Laterality: N/A;   IR CM INJ ANY COLONIC TUBE W/FLUORO  10/30/2018   IR GASTROSTOMY TUBE MOD SED  10/04/2018   IR GASTROSTOMY TUBE REMOVAL  10/08/2019   IR IMAGING GUIDED PORT INSERTION  10/04/2018   IR REMOVAL TUN ACCESS W/ PORT W/O FL MOD SED  10/14/2018   IR REPLACE G-TUBE SIMPLE

## 2020-11-15 ENCOUNTER — Ambulatory Visit (HOSPITAL_COMMUNITY): Payer: Medicare Other | Admitting: Physical Therapy

## 2020-11-15 ENCOUNTER — Other Ambulatory Visit: Payer: Self-pay

## 2020-11-15 DIAGNOSIS — M6281 Muscle weakness (generalized): Secondary | ICD-10-CM | POA: Diagnosis not present

## 2020-11-15 DIAGNOSIS — G8929 Other chronic pain: Secondary | ICD-10-CM

## 2020-11-15 DIAGNOSIS — M545 Low back pain, unspecified: Secondary | ICD-10-CM | POA: Diagnosis not present

## 2020-11-15 DIAGNOSIS — R2689 Other abnormalities of gait and mobility: Secondary | ICD-10-CM | POA: Diagnosis not present

## 2020-11-15 NOTE — Therapy (Signed)
[facility] [address] [place], [place], [address] Phone: [phone]   Fax:  [phone]  Physical Therapy Treatment  Patient Details  Name: Logan Price MRN: [id number] Date of Birth: [birth year] Referring Provider (PT): [name] FNP   Encounter Date: 11/15/2020   PT End of Session - 11/15/20 1509     Visit Number 5    Number of Visits 8    Date for PT Re-Evaluation 11/22/20    Authorization Type Primary Medicare Secondary general commercial    PT Start Time 1410    PT Stop Time 1452    PT Time Calculation (min) 42 min    Activity Tolerance Patient tolerated treatment well    Behavior During Therapy [facility] for tasks assessed/performed             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   HTN (hypertension)    Hyperlipidemia     Past Surgical History:  Procedure Laterality Date   BIOPSY  05/04/2015   Procedure: BIOPSY;  Surgeon: West Bali, MD;  [facility];  Service: Endoscopy;;  bx's of ileocecal valve    BIOPSY  06/22/2020   Procedure: BIOPSY;  Surgeon: Lanelle Bal, DO;  [facility];  Service: Endoscopy;;   BRONCHIAL BRUSHINGS  04/29/2019   Procedure: BRONCHIAL BRUSHINGS;  Surgeon: Josephine Igo, DO;  [facility];  Service: Pulmonary;;   BRONCHIAL NEEDLE ASPIRATION BIOPSY  04/29/2019   Procedure: BRONCHIAL NEEDLE ASPIRATION BIOPSIES;  Surgeon: Josephine Igo, DO;  [facility];  Service: Pulmonary;;   BRONCHIAL WASHINGS  04/29/2019   Procedure: BRONCHIAL WASHINGS;  Surgeon: Josephine Igo, DO;  [facility];  Service: Pulmonary;;   COLONOSCOPY WITH PROPOFOL N/A 05/04/2015   Dr. Darrick Penna: normal appearing ileum with prominent IC valve with tubular  adenomas, moderate diverticulosis in sigmoid colon, ascending colon, and retum. Moderate sized internal hemorrhoids. Surveillance in 5 years   COLONOSCOPY WITH PROPOFOL N/A 06/22/2020   Procedure: COLONOSCOPY WITH PROPOFOL;  Surgeon: Lanelle Bal, DO;  [facility];  Service: Endoscopy;  Laterality: N/A;  PM   ELECTROMAGNETIC NAVIGATION BROCHOSCOPY  04/29/2019   Procedure: NAVIGATION BRONCHOSCOPY;  Surgeon: Josephine Igo, DO;  [facility];  Service: Pulmonary;;   ESOPHAGOGASTRODUODENOSCOPY (EGD) WITH PROPOFOL N/A 12/19/2016   Procedure: ESOPHAGOGASTRODUODENOSCOPY (EGD) WITH PROPOFOL;  Surgeon: [place] Bali, MD;  [facility];  Service: Endoscopy;  Laterality: N/A;  11:30am   FLEXIBLE SIGMOIDOSCOPY N/A 12/10/2015   hemorrhoid banding X 3    HEMORRHOID BANDING N/A 12/10/2015   Procedure: HEMORRHOID BANDING;  Surgeon: [place] Bali, MD;  [facility];  Service: Endoscopy;  Laterality: N/A;  1:30 PM   INSERTION OF SUPRAPUBIC CATHETER N/A 02/24/2019   Procedure: INSERTION OF SUPRAPUBIC CATHETER;  Surgeon: Ihor Gully, MD;  [facility];  Service: Urology;  Laterality: N/A;   IR CM INJ ANY COLONIC TUBE W/FLUORO  10/30/2018   IR GASTROSTOMY TUBE MOD SED  10/04/2018   IR GASTROSTOMY TUBE REMOVAL  10/08/2019   IR IMAGING GUIDED PORT INSERTION  10/04/2018   IR REMOVAL TUN ACCESS W/ PORT W/O FL MOD SED  10/14/2018   IR REPLACE G-TUBE SIMPLE  WO FLUORO  11/03/2018   IR REPLACE G-TUBE SIMPLE WO FLUORO  07/28/2019   LAPAROSCOPIC INSERTION GASTROSTOMY TUBE Left 10/07/2018   Procedure: LAPAROSCOPIC  GASTROSTOMY TUBE;  Surgeon: Sheliah Hatch De Blanch, MD;  [facility];  Service: General;  Laterality: Left;   MICROLARYNGOSCOPY N/A 09/27/2018   Procedure: MICRO DIRECT LARYNGOSCOPY WITH BIOPSY;  Surgeon: Newman Pies, MD;  [facility];  Service: ENT;  Laterality: N/A;   None to date     As of 04/14/15   POLYPECTOMY  05/04/2015   Procedure: POLYPECTOMY;  Surgeon: West Bali, MD;  [facility];  Service: Endoscopy;;  descending colon polyp, ascending colon polyp   SAVORY DILATION N/A 12/19/2016   Procedure: SAVORY DILATION;  Surgeon: West Bali, MD;  [facility];  Service: Endoscopy;  Laterality: N/A;   TRACHEOSTOMY TUBE PLACEMENT N/A 09/27/2018   Procedure: AWAKE TRACHEOSTOMY;  Surgeon: Newman Pies, MD;  [facility];  Service: ENT;  Laterality: N/A;   TRANSURETHRAL RESECTION OF PROSTATE N/A 02/24/2019   Procedure: TRANSURETHRAL RESECTION OF THE PROSTATE (TURP);  Surgeon: Ihor Gully, MD;  [facility];  Service: Urology;  Laterality: N/A;   VIDEO BRONCHOSCOPY WITH ENDOBRONCHIAL NAVIGATION N/A 04/29/2019   Procedure: VIDEO BRONCHOSCOPY;  Surgeon: Josephine Igo, DO;  [facility];  Service: Pulmonary;  Laterality: N/A;    There were no vitals filed for this visit.   Subjective Assessment - 11/15/20 1411     Subjective pt reports pain in his back at 5/10.  States he is doing his HEP every other day (spouse reports he is not doing these).    Currently in Pain? Yes    Pain Score 5     Pain Location Back    Pain Orientation Lower    Pain Descriptors / Indicators Aching                               [facility] Adult PT Treatment/Exercise - 11/15/20 0001       Lumbar Exercises: Stretches   Other Lumbar Stretch Exercise prone x 2 min      Lumbar Exercises: Standing   Heel Raises 15 reps    Heel Raises Limitations 2 sets    Row Both;Theraband;20 reps    Theraband Level (Row) Level 3 (Green)    Row Limitations 2 sets of 10 reps    Shoulder Extension Both;Theraband;20 reps    Theraband Level (Shoulder Extension) Level 3 (Green)    Shoulder Extension Limitations 2 sets of 10 reps    Other Standing Lumbar Exercises true tandem bilateral max of 17" Lt lead and 12' Rt lead without UE assist X 3trails each side. Vectors 10X3" each with bil UE assist    Other Standing Lumbar Exercises hip abduction  2x 15, hip extension 2X15,  marching 1x 15      Lumbar Exercises: Seated   Sit to Stand 10 reps    Sit to Stand Limitations no UE's      Lumbar Exercises: Supine   Bridge 10 reps;3 seconds    Bridge Limitations + ab set; 2 sets                      PT Short Term Goals - 11/08/20 1302       PT SHORT TERM GOAL #1   Title Patient will be independent with HEP in order to improve functional outcomes.  Time 2    Period Weeks    Status On-going    Target Date 11/08/20      PT SHORT TERM GOAL #2   Title Patient will report at least 25% improvement in symptoms for improved quality of life.    Time 2    Period Weeks    Status On-going    Target Date 11/08/20               PT Long Term Goals - 11/08/20 1302       PT LONG TERM GOAL #1   Title Patient will report at least 75% improvement in symptoms for improved quality of life.    Time 4    Period Weeks    Status On-going      PT LONG TERM GOAL #2   Title Patient will be able to complete 5x STS in under 12 seconds in order to reduce the risk of falls.    Time 4    Period Weeks    Status On-going      PT LONG TERM GOAL #3   Title Patient will be able to ambulate at least 300 feet in in order to demonstrate improved gait speed for community ambulation.    Time 4    Period Weeks    Status On-going                   Plan - 11/15/20 1505     Clinical Impression Statement Continued with focus on improving LE strength and stability.  Pt required cues to maintain upright posturing and completing therex slowly and controlled.  Encouraged pt to complete HEP as directed at home for optimal results with therapy.  Pt verbalized understanding.   Pt most challenged with balance tasks with addition of vectors to help improve glute strength.  Progressed theraband exercises to standing with min guard from therapist due to instability.    Personal Factors and Comorbidities Fitness;Age;Comorbidity  2;Past/Current Experience;Time since onset of injury/illness/exacerbation    Comorbidities chronic LBP, hx cancer    Examination-Activity Limitations Locomotion Level;Transfers;Bend;Lift;Stairs;Squat;Stand    Examination-Participation Restrictions Cleaning;Community Activity;Shop;Volunteer;[facility]    Stability/Clinical Decision Making Stable/Uncomplicated    Rehab Potential Fair    PT Frequency 2x / week    PT Duration 4 weeks    PT Treatment/Interventions ADLs/Self Care Home Management;Aquatic Therapy;Electrical Stimulation;Iontophoresis 4mg /ml Dexamethasone;Moist Heat;Traction;Ultrasound;DME Instruction;Gait training;Stair training;Functional mobility training;Therapeutic activities;Therapeutic exercise;Balance training;Neuromuscular re-education;Patient/family education;Orthotic Fit/Training;Manual techniques;Passive range of motion;Dry needling;Energy conservation;Splinting;Taping;Compression bandaging;Scar mobilization;Manual lymph drainage    PT Next Visit Plan review lumbar mobility stretches, core and hip strength, postural strength, functional strength; transition to self management with HEP    PT Home Exercise Plan 8/1 marching, LAQ, STS; 8/11: prone, POE, hip extension, abd and bridges; 8/15 sit to stand    Consulted and Agree with Plan of Care Patient;Family member/caregiver    Family Member Consulted wife Logan Price             Patient will benefit from skilled therapeutic intervention in order to improve the following deficits and impairments:  Abnormal gait, Decreased range of motion, Decreased endurance, Decreased activity tolerance, Decreased balance, Decreased mobility, Decreased strength, Postural dysfunction, Improper body mechanics, Impaired flexibility, Pain  Visit Diagnosis: Other abnormalities of gait and mobility  Chronic bilateral low back pain, unspecified whether sciatica present  Muscle weakness (generalized)     Problem List Patient Active  Problem List   Diagnosis Date Noted   Low back pain 03/10/2020  Malignant neoplasm of right upper lobe of lung ([facility]) 05/27/2019   Primary cancer of left upper lobe of lung ([facility]) 05/27/2019   Tracheobronchitis 05/21/2019   Hx of radiation therapy 05/21/2019   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[facility]) 05/21/2019   Squamous cell lung cancer, right ([facility]) 05/21/2019   Adenocarcinoma, lung, left ([facility]) 05/21/2019   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   Pleural effusion    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[facility])    Normocytic anemia    PSVT (paroxysmal supraventricular tachycardia) ([facility]) 10/03/2018   Atrial fibrillation ([facility]) 10/03/2018   Dysphagia    Loss of weight    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   Gastritis determined by endoscopy    Esophageal dysphagia    False positive serological test for hepatitis C 12/13/2016   Globus sensation 12/12/2016   Smokes with greater than 40 pack year history 10/20/2016   Hemorrhoids, internal, with bleeding 10/06/2015   History of colonic polyps 04/14/2015   Vitamin D deficiency 10/09/2014   HLD (hyperlipidemia) 10/08/2012   GERD (gastroesophageal reflux disease) 10/08/2012   HTN (hypertension)    Diverticulitis 10/25/2009   Logan Price, PTA/CLT [phone]  Logan Price 11/15/2020, 3:10 PM  [facility] [address] [place], [place], [address] Phone: [phone]   Fax:  [phone]  Name: Logan Price MRN: [id number] Date of Birth: Jun 14, 1945

## 2020-11-17 ENCOUNTER — Encounter (HOSPITAL_COMMUNITY): Payer: Self-pay | Admitting: Physical Therapy

## 2020-11-17 ENCOUNTER — Ambulatory Visit (HOSPITAL_COMMUNITY): Payer: Medicare Other | Admitting: Physical Therapy

## 2020-11-17 ENCOUNTER — Other Ambulatory Visit: Payer: Self-pay

## 2020-11-17 DIAGNOSIS — M545 Low back pain, unspecified: Secondary | ICD-10-CM

## 2020-11-17 DIAGNOSIS — M6281 Muscle weakness (generalized): Secondary | ICD-10-CM | POA: Diagnosis not present

## 2020-11-17 DIAGNOSIS — G8929 Other chronic pain: Secondary | ICD-10-CM

## 2020-11-17 DIAGNOSIS — R2689 Other abnormalities of gait and mobility: Secondary | ICD-10-CM

## 2020-11-17 NOTE — Patient Instructions (Signed)
Access Code: [email or web address] URL: [email or web address] Date: 11/17/2020 Prepared by: Mitzi Hansen Wilkes Potvin  Exercises Standing Marching - 1 x daily - 7 x weekly - 2 sets - 15 reps Standing Hip Abduction with Counter Support - 1 x daily - 7 x weekly - 2 sets - 15 reps Standing Hip Extension with Counter Support - 1 x daily - 7 x weekly - 2 sets - 15 reps

## 2020-11-17 NOTE — Therapy (Signed)
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[facility])    Normocytic anemia    PSVT (paroxysmal supraventricular tachycardia) ([place]) 10/03/2018   Atrial fibrillation ([place]) 10/03/2018   Dysphagia    Loss of weight    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   Gastritis determined by endoscopy    Esophageal dysphagia    False positive serological test for hepatitis C 12/13/2016   Globus sensation 12/12/2016   Smokes with greater than 40 pack year history 10/20/2016   Hemorrhoids, internal, with bleeding 10/06/2015   History of colonic polyps 04/14/2015   Vitamin D deficiency 10/09/2014   HLD (hyperlipidemia) 10/08/2012   GERD (gastroesophageal reflux disease) 10/08/2012   HTN (hypertension)    Diverticulitis 10/25/2009   1:57 PM, 11/17/20 Mearl Latin PT, DPT Physical Therapist at [place], [name], [address] Phone: [phone]   Fax:  [phone]  Name: KINGDOM VANZANTEN MRN: [id number] Date of Birth: 1945/09/27  [place], [name], [address] Phone: [phone]   Fax:  [phone]  Physical Therapy Treatment  Patient Details  Name: DEANDRAE WAJDA MRN: [id number] Date of Birth: 1946-01-23 Referring Provider (PT): Evelina Dun FNP   Encounter Date: 11/17/2020   PT End of Session - 11/17/20 1319     Visit Number 6    Number of Visits 8    Date for PT Re-Evaluation 11/22/20    Authorization Type Primary Medicare Secondary general commercial    PT Start Time 1318    PT Stop Time 1356    PT Time Calculation (min) 38 min    Activity Tolerance Patient tolerated treatment well    Behavior During Therapy [facility] for tasks assessed/performed             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   HTN (hypertension)    Hyperlipidemia     Past Surgical History:  Procedure Laterality Date   BIOPSY  05/04/2015   Procedure: BIOPSY;  Surgeon: Danie Binder, MD;  [facility];  Service: Endoscopy;;  bx's of ileocecal valve    BIOPSY  06/22/2020   Procedure: BIOPSY;  Surgeon: Eloise Harman, DO;  [facility];  Service: Endoscopy;;   BRONCHIAL BRUSHINGS  04/29/2019   Procedure: BRONCHIAL BRUSHINGS;  Surgeon: Garner Nash, DO;  Location: [facility];  Service: Pulmonary;;   BRONCHIAL NEEDLE ASPIRATION BIOPSY  04/29/2019   Procedure: BRONCHIAL NEEDLE ASPIRATION BIOPSIES;  Surgeon: Garner Nash, DO;  Location: [facility];  Service: Pulmonary;;   BRONCHIAL WASHINGS  04/29/2019   Procedure: BRONCHIAL WASHINGS;  Surgeon: Garner Nash, DO;  [facility];  Service: Pulmonary;;   COLONOSCOPY WITH PROPOFOL N/A 05/04/2015   Dr. Oneida Alar: normal appearing ileum with prominent IC valve with tubular  adenomas, moderate diverticulosis in sigmoid colon, ascending colon, and retum. Moderate sized internal hemorrhoids. Surveillance in 5 years   COLONOSCOPY WITH PROPOFOL N/A 06/22/2020   Procedure: COLONOSCOPY WITH PROPOFOL;  Surgeon: Eloise Harman, DO;  [facility];  Service: Endoscopy;  Laterality: N/A;  PM   ELECTROMAGNETIC NAVIGATION BROCHOSCOPY  04/29/2019   Procedure: NAVIGATION BRONCHOSCOPY;  Surgeon: Garner Nash, DO;  Location: [facility];  Service: Pulmonary;;   ESOPHAGOGASTRODUODENOSCOPY (EGD) WITH PROPOFOL N/A 12/19/2016   Procedure: ESOPHAGOGASTRODUODENOSCOPY (EGD) WITH PROPOFOL;  Surgeon: Danie Binder, MD;  [facility];  Service: Endoscopy;  Laterality: N/A;  11:30am   FLEXIBLE SIGMOIDOSCOPY N/A 12/10/2015   hemorrhoid banding X 3    HEMORRHOID BANDING N/A 12/10/2015   Procedure: HEMORRHOID BANDING;  Surgeon: Danie Binder, MD;  [facility];  Service: Endoscopy;  Laterality: N/A;  1:30 PM   INSERTION OF SUPRAPUBIC CATHETER N/A 02/24/2019   Procedure: INSERTION OF SUPRAPUBIC CATHETER;  Surgeon: Kathie Rhodes, MD;  [facility];  Service: Urology;  Laterality: N/A;   IR CM INJ ANY COLONIC TUBE W/FLUORO  10/30/2018   IR GASTROSTOMY TUBE MOD SED  10/04/2018   IR GASTROSTOMY TUBE REMOVAL  10/08/2019   IR IMAGING GUIDED PORT INSERTION  10/04/2018   IR REMOVAL TUN ACCESS W/ PORT W/O FL MOD SED  10/14/2018   IR REPLACE G-TUBE SIMPLE  [place], [name], [address] Phone: [phone]   Fax:  [phone]  Physical Therapy Treatment  Patient Details  Name: DEANDRAE WAJDA MRN: [id number] Date of Birth: 1946-01-23 Referring Provider (PT): Evelina Dun FNP   Encounter Date: 11/17/2020   PT End of Session - 11/17/20 1319     Visit Number 6    Number of Visits 8    Date for PT Re-Evaluation 11/22/20    Authorization Type Primary Medicare Secondary general commercial    PT Start Time 1318    PT Stop Time 1356    PT Time Calculation (min) 38 min    Activity Tolerance Patient tolerated treatment well    Behavior During Therapy [facility] for tasks assessed/performed             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   HTN (hypertension)    Hyperlipidemia     Past Surgical History:  Procedure Laterality Date   BIOPSY  05/04/2015   Procedure: BIOPSY;  Surgeon: Danie Binder, MD;  [facility];  Service: Endoscopy;;  bx's of ileocecal valve    BIOPSY  06/22/2020   Procedure: BIOPSY;  Surgeon: Eloise Harman, DO;  [facility];  Service: Endoscopy;;   BRONCHIAL BRUSHINGS  04/29/2019   Procedure: BRONCHIAL BRUSHINGS;  Surgeon: Garner Nash, DO;  Location: [facility];  Service: Pulmonary;;   BRONCHIAL NEEDLE ASPIRATION BIOPSY  04/29/2019   Procedure: BRONCHIAL NEEDLE ASPIRATION BIOPSIES;  Surgeon: Garner Nash, DO;  Location: [facility];  Service: Pulmonary;;   BRONCHIAL WASHINGS  04/29/2019   Procedure: BRONCHIAL WASHINGS;  Surgeon: Garner Nash, DO;  [facility];  Service: Pulmonary;;   COLONOSCOPY WITH PROPOFOL N/A 05/04/2015   Dr. Oneida Alar: normal appearing ileum with prominent IC valve with tubular  adenomas, moderate diverticulosis in sigmoid colon, ascending colon, and retum. Moderate sized internal hemorrhoids. Surveillance in 5 years   COLONOSCOPY WITH PROPOFOL N/A 06/22/2020   Procedure: COLONOSCOPY WITH PROPOFOL;  Surgeon: Eloise Harman, DO;  [facility];  Service: Endoscopy;  Laterality: N/A;  PM   ELECTROMAGNETIC NAVIGATION BROCHOSCOPY  04/29/2019   Procedure: NAVIGATION BRONCHOSCOPY;  Surgeon: Garner Nash, DO;  Location: [facility];  Service: Pulmonary;;   ESOPHAGOGASTRODUODENOSCOPY (EGD) WITH PROPOFOL N/A 12/19/2016   Procedure: ESOPHAGOGASTRODUODENOSCOPY (EGD) WITH PROPOFOL;  Surgeon: Danie Binder, MD;  [facility];  Service: Endoscopy;  Laterality: N/A;  11:30am   FLEXIBLE SIGMOIDOSCOPY N/A 12/10/2015   hemorrhoid banding X 3    HEMORRHOID BANDING N/A 12/10/2015   Procedure: HEMORRHOID BANDING;  Surgeon: Danie Binder, MD;  [facility];  Service: Endoscopy;  Laterality: N/A;  1:30 PM   INSERTION OF SUPRAPUBIC CATHETER N/A 02/24/2019   Procedure: INSERTION OF SUPRAPUBIC CATHETER;  Surgeon: Kathie Rhodes, MD;  [facility];  Service: Urology;  Laterality: N/A;   IR CM INJ ANY COLONIC TUBE W/FLUORO  10/30/2018   IR GASTROSTOMY TUBE MOD SED  10/04/2018   IR GASTROSTOMY TUBE REMOVAL  10/08/2019   IR IMAGING GUIDED PORT INSERTION  10/04/2018   IR REMOVAL TUN ACCESS W/ PORT W/O FL MOD SED  10/14/2018   IR REPLACE G-TUBE SIMPLE  [place], [name], [address] Phone: [phone]   Fax:  [phone]  Physical Therapy Treatment  Patient Details  Name: DEANDRAE WAJDA MRN: [id number] Date of Birth: 1946-01-23 Referring Provider (PT): Evelina Dun FNP   Encounter Date: 11/17/2020   PT End of Session - 11/17/20 1319     Visit Number 6    Number of Visits 8    Date for PT Re-Evaluation 11/22/20    Authorization Type Primary Medicare Secondary general commercial    PT Start Time 1318    PT Stop Time 1356    PT Time Calculation (min) 38 min    Activity Tolerance Patient tolerated treatment well    Behavior During Therapy [facility] for tasks assessed/performed             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   HTN (hypertension)    Hyperlipidemia     Past Surgical History:  Procedure Laterality Date   BIOPSY  05/04/2015   Procedure: BIOPSY;  Surgeon: Danie Binder, MD;  [facility];  Service: Endoscopy;;  bx's of ileocecal valve    BIOPSY  06/22/2020   Procedure: BIOPSY;  Surgeon: Eloise Harman, DO;  [facility];  Service: Endoscopy;;   BRONCHIAL BRUSHINGS  04/29/2019   Procedure: BRONCHIAL BRUSHINGS;  Surgeon: Garner Nash, DO;  Location: [facility];  Service: Pulmonary;;   BRONCHIAL NEEDLE ASPIRATION BIOPSY  04/29/2019   Procedure: BRONCHIAL NEEDLE ASPIRATION BIOPSIES;  Surgeon: Garner Nash, DO;  Location: [facility];  Service: Pulmonary;;   BRONCHIAL WASHINGS  04/29/2019   Procedure: BRONCHIAL WASHINGS;  Surgeon: Garner Nash, DO;  [facility];  Service: Pulmonary;;   COLONOSCOPY WITH PROPOFOL N/A 05/04/2015   Dr. Oneida Alar: normal appearing ileum with prominent IC valve with tubular  adenomas, moderate diverticulosis in sigmoid colon, ascending colon, and retum. Moderate sized internal hemorrhoids. Surveillance in 5 years   COLONOSCOPY WITH PROPOFOL N/A 06/22/2020   Procedure: COLONOSCOPY WITH PROPOFOL;  Surgeon: Eloise Harman, DO;  [facility];  Service: Endoscopy;  Laterality: N/A;  PM   ELECTROMAGNETIC NAVIGATION BROCHOSCOPY  04/29/2019   Procedure: NAVIGATION BRONCHOSCOPY;  Surgeon: Garner Nash, DO;  Location: [facility];  Service: Pulmonary;;   ESOPHAGOGASTRODUODENOSCOPY (EGD) WITH PROPOFOL N/A 12/19/2016   Procedure: ESOPHAGOGASTRODUODENOSCOPY (EGD) WITH PROPOFOL;  Surgeon: Danie Binder, MD;  [facility];  Service: Endoscopy;  Laterality: N/A;  11:30am   FLEXIBLE SIGMOIDOSCOPY N/A 12/10/2015   hemorrhoid banding X 3    HEMORRHOID BANDING N/A 12/10/2015   Procedure: HEMORRHOID BANDING;  Surgeon: Danie Binder, MD;  [facility];  Service: Endoscopy;  Laterality: N/A;  1:30 PM   INSERTION OF SUPRAPUBIC CATHETER N/A 02/24/2019   Procedure: INSERTION OF SUPRAPUBIC CATHETER;  Surgeon: Kathie Rhodes, MD;  [facility];  Service: Urology;  Laterality: N/A;   IR CM INJ ANY COLONIC TUBE W/FLUORO  10/30/2018   IR GASTROSTOMY TUBE MOD SED  10/04/2018   IR GASTROSTOMY TUBE REMOVAL  10/08/2019   IR IMAGING GUIDED PORT INSERTION  10/04/2018   IR REMOVAL TUN ACCESS W/ PORT W/O FL MOD SED  10/14/2018   IR REPLACE G-TUBE SIMPLE

## 2020-11-22 ENCOUNTER — Ambulatory Visit (HOSPITAL_COMMUNITY)
Admission: RE | Admit: 2020-11-22 | Discharge: 2020-11-22 | Disposition: A | Discharge status: Home / Self Care | Payer: Medicare Other | Source: Ambulatory Visit | Attending: Radiation Oncology | Admitting: Radiation Oncology

## 2020-11-22 ENCOUNTER — Other Ambulatory Visit: Payer: Self-pay

## 2020-11-22 ENCOUNTER — Ambulatory Visit (HOSPITAL_COMMUNITY): Payer: Medicare Other | Admitting: Physical Therapy

## 2020-11-22 DIAGNOSIS — R2689 Other abnormalities of gait and mobility: Secondary | ICD-10-CM

## 2020-11-22 DIAGNOSIS — R918 Other nonspecific abnormal finding of lung field: Secondary | ICD-10-CM | POA: Diagnosis not present

## 2020-11-22 DIAGNOSIS — C3491 Malignant neoplasm of unspecified part of right bronchus or lung: Secondary | ICD-10-CM | POA: Insufficient documentation

## 2020-11-22 DIAGNOSIS — M6281 Muscle weakness (generalized): Secondary | ICD-10-CM | POA: Diagnosis not present

## 2020-11-22 DIAGNOSIS — C320 Malignant neoplasm of glottis: Secondary | ICD-10-CM | POA: Diagnosis not present

## 2020-11-22 DIAGNOSIS — M545 Low back pain, unspecified: Secondary | ICD-10-CM

## 2020-11-22 DIAGNOSIS — I700 Atherosclerosis of aorta: Secondary | ICD-10-CM | POA: Diagnosis not present

## 2020-11-22 DIAGNOSIS — C3492 Malignant neoplasm of unspecified part of left bronchus or lung: Secondary | ICD-10-CM | POA: Diagnosis not present

## 2020-11-22 DIAGNOSIS — R911 Solitary pulmonary nodule: Secondary | ICD-10-CM | POA: Diagnosis not present

## 2020-11-22 DIAGNOSIS — G8929 Other chronic pain: Secondary | ICD-10-CM | POA: Diagnosis not present

## 2020-11-22 DIAGNOSIS — J439 Emphysema, unspecified: Secondary | ICD-10-CM | POA: Diagnosis not present

## 2020-11-22 NOTE — Therapy (Addendum)
[place] Phone: [phone]   Fax:  [phone]  Name: Logan Price MRN: [id number] Date of Birth: July 25, 1945  [facility] [address] [place], [name], [address] Phone: [phone]   Fax:  [phone]  Physical Therapy Treatment/Discharge Summary  Patient Details  Name: Logan Price MRN: [id number] Date of Birth: January 04, 1946 Referring Provider (PT): Evelina Dun FNP   Encounter Date: 11/22/2020  PHYSICAL THERAPY DISCHARGE SUMMARY  Visits from Start of Care: 7  Current functional level related to goals / functional outcomes: See below   Remaining deficits: See below   Education / Equipment: See below   Patient agrees to discharge. Patient goals were partially met. Patient is being discharged due to maximized rehab potential.   3:04 PM, 11/23/20 Mearl Latin PT, DPT Physical Therapist at [facility]    PT End of Session - 11/22/20 1414     Visit Number 7    Number of Visits 8    Date for PT Re-Evaluation 11/22/20    Authorization Type Primary Medicare Secondary general commercial    PT Start Time 1318    PT Stop Time 1354    PT Time Calculation (min) 36 min    Activity Tolerance Patient tolerated treatment well    Behavior During Therapy [facility] for tasks assessed/performed             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   HTN (hypertension)    Hyperlipidemia     Past Surgical History:  Procedure Laterality Date   BIOPSY  05/04/2015   Procedure: BIOPSY;  Surgeon: Danie Binder, MD;  [facility];  Service: Endoscopy;;  bx's of ileocecal valve    BIOPSY  06/22/2020   Procedure: BIOPSY;  Surgeon: Eloise Harman, DO;  [facility];  Service: Endoscopy;;   BRONCHIAL BRUSHINGS  04/29/2019   Procedure: BRONCHIAL BRUSHINGS;  Surgeon: Garner Nash, DO;  Location: [facility];  Service: Pulmonary;;   BRONCHIAL NEEDLE ASPIRATION BIOPSY  04/29/2019   Procedure: BRONCHIAL NEEDLE ASPIRATION BIOPSIES;  Surgeon: Garner Nash, DO;  Location: [facility];  Service: Pulmonary;;   BRONCHIAL WASHINGS  04/29/2019   Procedure: BRONCHIAL WASHINGS;  Surgeon: Garner Nash, DO;  [facility];  Service: Pulmonary;;   COLONOSCOPY WITH PROPOFOL N/A 05/04/2015   Dr. Oneida Alar: normal appearing ileum with prominent IC valve with tubular adenomas, moderate diverticulosis in sigmoid colon, ascending colon, and retum. Moderate sized internal hemorrhoids. Surveillance in 5 years   COLONOSCOPY WITH PROPOFOL N/A 06/22/2020   Procedure: COLONOSCOPY WITH PROPOFOL;  Surgeon: Eloise Harman, DO;  [facility];  Service: Endoscopy;  Laterality: N/A;  PM   ELECTROMAGNETIC NAVIGATION BROCHOSCOPY  04/29/2019   Procedure: NAVIGATION BRONCHOSCOPY;  Surgeon: Garner Nash, DO;  Location: [facility];  Service: Pulmonary;;   ESOPHAGOGASTRODUODENOSCOPY (EGD) WITH PROPOFOL N/A 12/19/2016   Procedure: ESOPHAGOGASTRODUODENOSCOPY (EGD) WITH PROPOFOL;  Surgeon: Danie Binder, MD;  [facility];  Service: Endoscopy;  Laterality: N/A;  11:30am   FLEXIBLE SIGMOIDOSCOPY N/A 12/10/2015   hemorrhoid banding X 3    HEMORRHOID BANDING N/A 12/10/2015   Procedure: HEMORRHOID BANDING;  Surgeon: Danie Binder, MD;  [facility];  Service: Endoscopy;  Laterality: N/A;  1:30 PM   INSERTION OF SUPRAPUBIC CATHETER N/A 02/24/2019  [facility] [address] [place], [name], [address] Phone: [phone]   Fax:  [phone]  Physical Therapy Treatment/Discharge Summary  Patient Details  Name: Logan Price MRN: [id number] Date of Birth: January 04, 1946 Referring Provider (PT): Evelina Dun FNP   Encounter Date: 11/22/2020  PHYSICAL THERAPY DISCHARGE SUMMARY  Visits from Start of Care: 7  Current functional level related to goals / functional outcomes: See below   Remaining deficits: See below   Education / Equipment: See below   Patient agrees to discharge. Patient goals were partially met. Patient is being discharged due to maximized rehab potential.   3:04 PM, 11/23/20 Mearl Latin PT, DPT Physical Therapist at [facility]    PT End of Session - 11/22/20 1414     Visit Number 7    Number of Visits 8    Date for PT Re-Evaluation 11/22/20    Authorization Type Primary Medicare Secondary general commercial    PT Start Time 1318    PT Stop Time 1354    PT Time Calculation (min) 36 min    Activity Tolerance Patient tolerated treatment well    Behavior During Therapy [facility] for tasks assessed/performed             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   HTN (hypertension)    Hyperlipidemia     Past Surgical History:  Procedure Laterality Date   BIOPSY  05/04/2015   Procedure: BIOPSY;  Surgeon: Danie Binder, MD;  [facility];  Service: Endoscopy;;  bx's of ileocecal valve    BIOPSY  06/22/2020   Procedure: BIOPSY;  Surgeon: Eloise Harman, DO;  [facility];  Service: Endoscopy;;   BRONCHIAL BRUSHINGS  04/29/2019   Procedure: BRONCHIAL BRUSHINGS;  Surgeon: Garner Nash, DO;  Location: [facility];  Service: Pulmonary;;   BRONCHIAL NEEDLE ASPIRATION BIOPSY  04/29/2019   Procedure: BRONCHIAL NEEDLE ASPIRATION BIOPSIES;  Surgeon: Garner Nash, DO;  Location: [facility];  Service: Pulmonary;;   BRONCHIAL WASHINGS  04/29/2019   Procedure: BRONCHIAL WASHINGS;  Surgeon: Garner Nash, DO;  [facility];  Service: Pulmonary;;   COLONOSCOPY WITH PROPOFOL N/A 05/04/2015   Dr. Oneida Alar: normal appearing ileum with prominent IC valve with tubular adenomas, moderate diverticulosis in sigmoid colon, ascending colon, and retum. Moderate sized internal hemorrhoids. Surveillance in 5 years   COLONOSCOPY WITH PROPOFOL N/A 06/22/2020   Procedure: COLONOSCOPY WITH PROPOFOL;  Surgeon: Eloise Harman, DO;  [facility];  Service: Endoscopy;  Laterality: N/A;  PM   ELECTROMAGNETIC NAVIGATION BROCHOSCOPY  04/29/2019   Procedure: NAVIGATION BRONCHOSCOPY;  Surgeon: Garner Nash, DO;  Location: [facility];  Service: Pulmonary;;   ESOPHAGOGASTRODUODENOSCOPY (EGD) WITH PROPOFOL N/A 12/19/2016   Procedure: ESOPHAGOGASTRODUODENOSCOPY (EGD) WITH PROPOFOL;  Surgeon: Danie Binder, MD;  [facility];  Service: Endoscopy;  Laterality: N/A;  11:30am   FLEXIBLE SIGMOIDOSCOPY N/A 12/10/2015   hemorrhoid banding X 3    HEMORRHOID BANDING N/A 12/10/2015   Procedure: HEMORRHOID BANDING;  Surgeon: Danie Binder, MD;  [facility];  Service: Endoscopy;  Laterality: N/A;  1:30 PM   INSERTION OF SUPRAPUBIC CATHETER N/A 02/24/2019  [place] Phone: [phone]   Fax:  [phone]  Name: Logan Price MRN: [id number] Date of Birth: July 25, 1945  [place] Phone: [phone]   Fax:  [phone]  Name: Logan Price MRN: [id number] Date of Birth: July 25, 1945

## 2020-11-23 ENCOUNTER — Telehealth: Payer: Self-pay

## 2020-11-23 ENCOUNTER — Encounter: Payer: Self-pay | Admitting: Radiation Oncology

## 2020-11-23 ENCOUNTER — Ambulatory Visit
Admission: RE | Admit: 2020-11-23 | Discharge: 2020-11-23 | Disposition: A | Discharge status: Home / Self Care | Payer: Medicare Other | Source: Ambulatory Visit | Attending: Radiation Oncology | Admitting: Radiation Oncology

## 2020-11-23 VITALS — BP 160/84 | HR 73 | Temp 97.0°F | Resp 18 | Ht 69.0 in | Wt 164.0 lb

## 2020-11-23 DIAGNOSIS — C320 Malignant neoplasm of glottis: Secondary | ICD-10-CM

## 2020-11-23 DIAGNOSIS — C3411 Malignant neoplasm of upper lobe, right bronchus or lung: Secondary | ICD-10-CM | POA: Diagnosis not present

## 2020-11-23 DIAGNOSIS — Z923 Personal history of irradiation: Secondary | ICD-10-CM | POA: Diagnosis not present

## 2020-11-23 DIAGNOSIS — Z79899 Other long term (current) drug therapy: Secondary | ICD-10-CM | POA: Insufficient documentation

## 2020-11-23 DIAGNOSIS — J432 Centrilobular emphysema: Secondary | ICD-10-CM | POA: Diagnosis not present

## 2020-11-23 DIAGNOSIS — M545 Low back pain, unspecified: Secondary | ICD-10-CM | POA: Diagnosis not present

## 2020-11-23 DIAGNOSIS — Z85819 Personal history of malignant neoplasm of unspecified site of lip, oral cavity, and pharynx: Secondary | ICD-10-CM | POA: Diagnosis not present

## 2020-11-23 DIAGNOSIS — I2510 Atherosclerotic heart disease of native coronary artery without angina pectoris: Secondary | ICD-10-CM | POA: Diagnosis not present

## 2020-11-23 DIAGNOSIS — C760 Malignant neoplasm of head, face and neck: Secondary | ICD-10-CM

## 2020-11-23 DIAGNOSIS — I700 Atherosclerosis of aorta: Secondary | ICD-10-CM | POA: Insufficient documentation

## 2020-11-23 DIAGNOSIS — R918 Other nonspecific abnormal finding of lung field: Secondary | ICD-10-CM | POA: Insufficient documentation

## 2020-11-23 DIAGNOSIS — Z08 Encounter for follow-up examination after completed treatment for malignant neoplasm: Secondary | ICD-10-CM | POA: Diagnosis not present

## 2020-11-23 DIAGNOSIS — C3412 Malignant neoplasm of upper lobe, left bronchus or lung: Secondary | ICD-10-CM | POA: Diagnosis not present

## 2020-11-23 NOTE — Progress Notes (Signed)
Mr. Strahle returns for follow-up of radiation to the larynx completed on 12/10/2018 and SBRT to the right and left lungs on 06/13/2019, and to receive CT scan results from 11/22/2020  Pain issues, if any: Patient denies any neck or throat pain. Continues to deal with ongoing lower back pain (5 out of 10) Using a feeding tube?: N/A Weight changes, if any: Wt Readings from Last 3 Encounters:  11/23/20 164 lb (74.4 kg)  10/07/20 163 lb 12.8 oz (74.3 kg)  09/14/20 163 lb (73.9 kg)   Swallowing issues, if any: Patient denies any issues. Reports he can eat a wide variety of food and beverages, and has a stable appetite Smoking or chewing tobacco? None SOB: Denies any changes or worsening of symptoms Cough: Reports productive cough throughout the day. States phlegm is usually clear color, and denies any instances of hemoptysis  Using fluoride trays daily? N/A--does see a community dentist regularly Last ENT visit was on: Reports he saw Dr. Leta Baptist this past March, and will follow-up with him on 12/30/2020 Other notable issues, if any: Denies any ear or jaw pain, or difficulty opening his mouth. Denies any swelling/lymphedema under his chin or down is neck. Denies any fatigue and reports good sleeping habits. Denies weakness in upper or lower extremities, but he continues to work with PT on gait/strength exercises  Vitals:   11/23/20 1422  BP: (!) 160/84  Pulse: 73  Resp: 18  Temp: (!) 97 F (36.1 C)  SpO2: 97%

## 2020-11-23 NOTE — Telephone Encounter (Signed)
error 

## 2020-11-24 ENCOUNTER — Ambulatory Visit (HOSPITAL_COMMUNITY): Payer: Medicare Other | Admitting: Physical Therapy

## 2020-11-24 NOTE — Progress Notes (Signed)
Radiation Oncology         [phone] ________________________________  Name: Logan Price MRN: [id number]  Date: 11/23/2020  DOB: 12/24/1945  Follow-Up Visit Note in person  CC: Logan Spencer, FNP  Newman Pies, MD  Diagnosis and Prior Radiotherapy:       ICD-10-CM   1. Malignant neoplasm of right upper lobe of lung (HCC)  C34.11     2. Primary cancer of left upper lobe of lung (HCC)  C34.12     3. Head and neck cancer (HCC)  C76.0     4. 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 returns for follow-up of radiation to the larynx completed on 12/10/2018 and SBRT to the right and left lungs on 06/13/2019, and to receive CT scan results from 11/22/2020  Pain issues, if any: Patient denies any neck or throat pain. Continues to deal with ongoing lower back pain (5 out of 10) Using a feeding tube?: N/A Weight changes, if any: Wt Readings from Last 3 Encounters:  11/23/20 164 lb (74.4 kg)  10/07/20 163 lb 12.8 oz (74.3 kg)  09/14/20 163 lb (73.9 kg)   Swallowing issues, if any: Patient denies any issues. Reports he can eat a wide variety of food and beverages, and has a stable appetite Smoking or chewing tobacco? None SOB: Denies any changes or worsening of symptoms Cough: Reports productive cough throughout the day. States phlegm is usually clear color, and denies any instances of hemoptysis  Using fluoride trays  daily? N/A--does see a community dentist regularly Last ENT visit was on: Reports he saw Dr. Newman Pies this past March, and will follow-up with him on 12/30/2020 Other notable issues, if any: Denies any ear or jaw pain, or difficulty opening his mouth. Denies any swelling/lymphedema under his chin or down is neck. Denies any fatigue and reports good sleeping habits. Denies weakness in upper or lower extremities, but he continues to work with PT on gait/strength exercises  Vitals:   11/23/20 1422  BP: (!) 160/84  Pulse: 73  Resp: 18  Temp: (!) 97 F (36.1 C)  SpO2: 97%    ALLERGIES:  has No Known Allergies.  Meds: Current Outpatient Medications  Medication Sig Dispense Refill   doxazosin (CARDURA) 1 MG tablet Place 1 tablet (1 mg total) into feeding tube daily. (Patient taking differently: Take 1 mg by mouth daily.) 30 tablet 5   pantoprazole (PROTONIX) 40 MG tablet TAKE ONE TABLET DAILY 30 MINUTES PRIOR TO THE FIRST MEAL 90 tablet 2   simvastatin (ZOCOR) 20 MG tablet TAKE 1 TABLET DAILY AT 6PM 90 tablet 0   No current facility-administered medications for this encounter.    Physical Findings: The patient is in no acute distress. Patient is alert and oriented. Wt Readings from Last 3 Encounters:  11/23/20 164 lb (74.4 kg)  10/07/20 163 lb 12.8 oz (74.3 kg)  09/14/20 163 lb (73.9 kg)    height is 5\' 9"  (1.753 m) and weight is 164 lb (74.4 kg). His temporal temperature is 97 F (36.1 C) (abnormal). His blood pressure is 160/84 (abnormal) and his pulse is 73. His respiration is 18 and oxygen saturation is 97%. .  General: Alert and oriented, in no acute distress.   HEENT: No lesions in the mouth or upper throat NECK: No palpable masses in the cervical or supraclavicular neck Heart regular in rate and rhythm Chest clear to auscultation bilaterally Extremities without any edema Psychiatric: Affect appropriate, judgment and insight intact  Lab Findings: Lab Results  Component  Value Date   WBC 4.4 10/07/2020   HGB 12.9 (L) 10/07/2020   HCT 39.5 10/07/2020   MCV 91 10/07/2020   PLT 185 10/07/2020    Lab Results  Component Value Date   TSH 3.472 07/20/2020    Radiographic Findings: I personally reviewed the CT scan of the chest CT Chest Wo Contrast  Result Date: 11/22/2020 CLINICAL DATA:  75 year old male with history of non-small cell lung cancer. Follow-up study. EXAM: CT CHEST WITHOUT CONTRAST TECHNIQUE: Multidetector CT imaging of the chest was performed following the standard protocol without IV contrast. COMPARISON:  Chest CT 07/19/2020. FINDINGS: Cardiovascular: Heart size is normal. There is no significant pericardial fluid, thickening or pericardial calcification. There is aortic atherosclerosis, as well as atherosclerosis of the great vessels of the mediastinum and the coronary arteries, including calcified atherosclerotic plaque in the left main, left anterior descending, left circumflex and right coronary arteries. Mediastinum/Nodes: No pathologically enlarged mediastinal or hilar lymph nodes. Please note that accurate exclusion of hilar adenopathy is limited on noncontrast CT scans. Esophagus is unremarkable in appearance. No axillary lymphadenopathy. Lungs/Pleura: Previously treated right middle lobe pulmonary nodule is similar to prior examinations, currently measuring 1.4 x 1.5 x 0.7 cm (axial image 98 of series 7 and sagittal image 45 of series 6). However, there has been substantial increase in size of a right upper lobe pulmonary nodule which currently measures 0.7 x 0.6 x 1.1 cm (axial image 32 of series 7 and coronal image 52 of series 5). Clustered calcified granulomas are again noted in the medial aspect of the right lower lobe. 4 mm pulmonary nodule in the medial aspect of the left upper lobe (axial image 23 of series 7), stable compared to prior examinations dating back to 03/25/2020, likely benign. Previously treated nodule in the left upper lobe  (axial image 87 of series 7) measures 11 x 9 mm, similar to prior studies, with associated fiducial markers and surrounding postradiation changes. No other definite suspicious appearing pulmonary nodules or masses are noted. No acute consolidative airspace disease. No pleural effusions. Diffuse bronchial wall thickening with mild centrilobular and paraseptal emphysema. Small left-sided Bochdalek's hernia incidentally noted. Upper Abdomen: Aortic atherosclerosis. 3.0 cm low-attenuation lesion in the medial aspect of the upper pole the right kidney (axial image 166 of series 2), incompletely characterized on today's non-contrast CT examination, but statistically likely to represent a cyst. Musculoskeletal: There are no aggressive appearing lytic or blastic lesions noted in the visualized portions of the skeleton. IMPRESSION: 1. Although most of the previously noted pulmonary nodules appear stable compared to prior examinations, the exception is an enlarging right upper lobe pulmonary nodule which currently measures 7 x 6 x 11 mm (axial image 32 of series 7 and coronal image 52 of series 5). This new nodule is concerning for a metastatic lesion or a new primary lung  malignancy. 2. Aortic atherosclerosis, in addition to left main and 3 vessel coronary artery disease. Assessment for potential risk factor modification, dietary therapy or pharmacologic therapy may be warranted, if clinically indicated. 3. Diffuse bronchial wall thickening with mild centrilobular and paraseptal emphysema; imaging findings suggestive of underlying COPD. Aortic Atherosclerosis (ICD10-I70.0). Electronically Signed   By: Trudie Reed M.D.   On: 11/22/2020 15:21     Impression/Plan:    1) Disease status: No evidence of head and neck cancer local or regional progression.  Regarding his chest, I personally reviewed his chest CT.  The previously treated lesions have responded well to SBRT but there is a growing right upper lobe pulmonary  nodule which is just over centimeter in size.  I recommend that we get a PET scan in 1 month to verify if this is continuing to grow and whether it is hypermetabolic.  We will tentatively schedule him for an SBRT CT simulation if it continues to look suspicious at that time.  PET scan will also help rule out disease elsewhere in the body.  He has longstanding back pain and he understands that the PET scan will include his spine.  He is pleased with this plan as his his significant other.Marland Kitchen  2) Nutritional Status: no new issues   3) Risk Factors: The patient has been educated about risk factors including alcohol and tobacco abuse; they understand that avoidance of vaping/tobacco is important to prevent recurrences as well as other cancers, and he is abstaining  4) Swallowing: continue SLP exercises, swallowing has improved since trach removal  5)  Thyroid function: will check annually  - WNL   Lab Results  Component Value Date   TSH 3.472 07/20/2020   6) dental: Continue following with dentistry regularly     On date of service, in total, I spent 20 minutes on this encounter. Patient was seen in person.  ___   Lonie Peak, MD

## 2020-12-10 ENCOUNTER — Ambulatory Visit (INDEPENDENT_AMBULATORY_CARE_PROVIDER_SITE_OTHER): Payer: Medicare Other | Admitting: Licensed Clinical Social Worker

## 2020-12-10 DIAGNOSIS — K219 Gastro-esophageal reflux disease without esophagitis: Secondary | ICD-10-CM

## 2020-12-10 DIAGNOSIS — I4891 Unspecified atrial fibrillation: Secondary | ICD-10-CM

## 2020-12-10 DIAGNOSIS — E559 Vitamin D deficiency, unspecified: Secondary | ICD-10-CM

## 2020-12-10 DIAGNOSIS — I10 Essential (primary) hypertension: Secondary | ICD-10-CM

## 2020-12-10 DIAGNOSIS — C3492 Malignant neoplasm of unspecified part of left bronchus or lung: Secondary | ICD-10-CM

## 2020-12-10 DIAGNOSIS — E785 Hyperlipidemia, unspecified: Secondary | ICD-10-CM

## 2020-12-10 NOTE — Patient Instructions (Signed)
Visit Information  PATIENT GOALS:  Goals Addressed             This Visit's Progress    Protect My Health ; patient will work to improve mobility, manage pain issus and complete ADLs daily       Timeframe:  Short-Term Goal Priority:  Medium Progress: On Track Start Date:      11/02/20                      Expected End Date:    03/04/21                 Follow Up Date  01/24/21 at 3:30 PM   Protect My Health (Patient) Patient will work to improve mobility, manage pain issues and complete ADLs daily   Why is this important?   Screening tests can find diseases early when they are easier to treat.  Your doctor or nurse will talk with you about which tests are important for you.  Getting shots for common diseases like the flu and shingles will help prevent them.     Patient Strengths:  Attends scheduled medical appointments Takes medications as prescribed Has support from spouse Drives to needed appointments  Patient Deficits:  Pain issues and mobility issues  Patient Goals:   To attend scheduled medical appointments To take medications as prescribed To communicate regularly with his spouse to discuss client needs To communicate with RNCM or LCSW as needed for CCM support To participate in physical therapy sessions as scheduled -  Follow Up Plan: LCSW to call client or spouse of client on 01/24/21 at 3:30 PM to assess client needs    Norva Riffle.Feliz Lincoln MSW, LCSW Licensed Clinical Social Worker [facility] Care Management [phone]

## 2020-12-10 NOTE — Chronic Care Management (AMB) (Signed)
Chronic Care Management    Clinical Social Work Note  12/10/2020 Name: Logan Price MRN: [id number] DOB: 04/16/1945  Logan Price is a 75 y.o. year old male who is a primary care patient of Logan Price, Logan Price. The CCM team was consulted to assist the patient with chronic disease management and/or care coordination needs related to: [name] .   Engaged with patient /spouse of patient, Logan Price, by telephone for follow up visit in response to provider referral for social work chronic care management and care coordination services.   Consent to Services:  The patient was given information about Chronic Care Management services, agreed to services, and gave verbal consent prior to initiation of services.  Please see initial visit note for detailed documentation.   Patient agreed to services and consent obtained.   Assessment: Review of patient past medical history, allergies, medications, and health status, including review of relevant consultants reports was performed today as part of a comprehensive evaluation and provision of chronic care management and care coordination services.     SDOH (Social Determinants of Health) assessments and interventions performed:  SDOH Interventions    Flowsheet Row Most Recent Value  SDOH Interventions   Stress Interventions Provide Counseling  [client has stress related to managing ongoing health conditions]  Depression Interventions/Treatment  --  [informed Logan Price , spouse of client, of LCSW support for client and of RNCM support for client]        Advanced Directives Status: See [employer] application for related entries.  CCM Care Plan  No Known Allergies  Outpatient Encounter Medications as of 12/10/2020  Medication Sig   doxazosin (CARDURA) 1 MG tablet Place 1 tablet (1 mg total) into feeding tube daily. (Patient taking differently: Take 1 mg by mouth daily.)   pantoprazole (PROTONIX) 40 MG tablet TAKE ONE TABLET DAILY  30 MINUTES PRIOR TO THE FIRST MEAL   simvastatin (ZOCOR) 20 MG tablet TAKE 1 TABLET DAILY AT 6PM   No facility-administered encounter medications on file as of 12/10/2020.    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   Pleural effusion    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HCC)    Normocytic anemia    PSVT (paroxysmal supraventricular tachycardia) (HCC) 10/03/2018   Atrial fibrillation (HCC) 10/03/2018   Dysphagia    Loss of weight    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   Gastritis determined by endoscopy    Esophageal dysphagia    False positive serological test for hepatitis C 12/13/2016   Globus sensation 12/12/2016   Smokes with greater than 40 pack year history 10/20/2016   Hemorrhoids, internal, with bleeding 10/06/2015   History of colonic polyps 04/14/2015   Vitamin D deficiency 10/09/2014   HLD (hyperlipidemia) 10/08/2012   GERD (gastroesophageal reflux disease) 10/08/2012   HTN (hypertension)    Diverticulitis 10/25/2009    Conditions to be addressed/monitored: monitor client completion of daily ADLs and other daily activities  Care Plan : LCSW care plan  Updates made by Logan Blakes, LCSW since 12/10/2020 12:00 AM  Problem: Coping Skills (General Plan of Care)      Goal: Manage mobility issues, manage pain issues and work to complete ADLs daily   Start Date: 11/02/2020  Expected End Date: 03/04/2021  This Visit's Progress: On track  Recent Progress: On track  Priority: Medium  Note:   Current barriers:   Patient in need of assistance  with connecting to community resources for helping client find needed resources to help him with managing mobility and ADLs completion Patient is unable to independently navigate community resource options without care coordination support Adenocarcinoma of Left Lung Pain issues  Clinical Goals:  Patient will communicate with LCSW in next 30 days to discuss community resources of possible help to client Patient to communicate with LCSW in next 30 days to discuss client mobility and pain issues of client Patient to communicate with RNCM  in next 30 days as needed to discuss nursing needs of client  Clinical Interventions:  Collaboration with Logan Price, Logan Price regarding development and update of comprehensive plan of care as evidenced by provider attestation and co-signature Talked with Logan Price, spouse of client, about pain issues of client (client has pain in his back) Talked with Logan Price about upcoming client medical appointments. She said he has an appointment scheduled with medical provider for December 29, 2020. She said he also has an appointment scheduled with a medical provider for December 31, 2020 Talked with Logan Price about appetite of client Talked with Logan Price about sleeping issues of client Talked with Logan Price about medication procurement of client Encouraged client or Logan Price  to call RNCM as needed for nursing support    Patient Strengths:  Attends scheduled medical appointments Takes medications as prescribed Has support from spouse Drives to needed appointments  Patient Deficits:  Pain issues and mobility issues  Patient Goals:   To attend scheduled medical appointments To take medications as prescribed To communicate regularly with his spouse to discuss client needs To communicate with RNCM or LCSW as needed for CCM support To participate in physical therapy sessions as scheduled -  Follow Up Plan: LCSW to call client or spouse of client on 01/24/21 at 3:30 PM to assess  needs of client     Logan Price.Logan Price MSW, LCSW Licensed Clinical Social Worker [facility] Care Management [phone]

## 2020-12-18 ENCOUNTER — Other Ambulatory Visit: Payer: Self-pay | Admitting: Family

## 2020-12-18 DIAGNOSIS — E785 Hyperlipidemia, unspecified: Secondary | ICD-10-CM

## 2020-12-24 ENCOUNTER — Telehealth: Payer: Self-pay | Admitting: *Deleted

## 2020-12-24 DIAGNOSIS — E785 Hyperlipidemia, unspecified: Secondary | ICD-10-CM

## 2020-12-24 DIAGNOSIS — I10 Essential (primary) hypertension: Secondary | ICD-10-CM | POA: Diagnosis not present

## 2020-12-24 DIAGNOSIS — I4891 Unspecified atrial fibrillation: Secondary | ICD-10-CM | POA: Diagnosis not present

## 2020-12-24 DIAGNOSIS — C3492 Malignant neoplasm of unspecified part of left bronchus or lung: Secondary | ICD-10-CM

## 2020-12-24 NOTE — Telephone Encounter (Signed)
CALLED PATIENT TO INFORM THAT FU AND SIM HAVE BEEN MOVED TO 01-07-21- 2 PM FOR FU AND SIM @ 2:30 PM, SPOKE WITH PATIENT AND HE IS AWARE OF THESE APPT. CHANGES AND IS GOOD WITH THEM

## 2020-12-29 ENCOUNTER — Ambulatory Visit: Payer: Self-pay | Admitting: Radiation Oncology

## 2020-12-29 ENCOUNTER — Ambulatory Visit: Payer: Medicare Other | Admitting: Radiation Oncology

## 2020-12-31 DIAGNOSIS — R1312 Dysphagia, oropharyngeal phase: Secondary | ICD-10-CM | POA: Diagnosis not present

## 2020-12-31 DIAGNOSIS — Z8521 Personal history of malignant neoplasm of larynx: Secondary | ICD-10-CM | POA: Diagnosis not present

## 2021-01-04 ENCOUNTER — Ambulatory Visit (HOSPITAL_COMMUNITY)
Admission: RE | Admit: 2021-01-04 | Discharge: 2021-01-04 | Disposition: A | Discharge status: Home / Self Care | Payer: Medicare Other | Source: Ambulatory Visit | Attending: Radiation Oncology | Admitting: Radiation Oncology

## 2021-01-04 ENCOUNTER — Other Ambulatory Visit: Payer: Self-pay

## 2021-01-04 DIAGNOSIS — N281 Cyst of kidney, acquired: Secondary | ICD-10-CM | POA: Diagnosis not present

## 2021-01-04 DIAGNOSIS — Z79899 Other long term (current) drug therapy: Secondary | ICD-10-CM | POA: Diagnosis not present

## 2021-01-04 DIAGNOSIS — C760 Malignant neoplasm of head, face and neck: Secondary | ICD-10-CM | POA: Diagnosis not present

## 2021-01-04 DIAGNOSIS — C320 Malignant neoplasm of glottis: Secondary | ICD-10-CM | POA: Diagnosis not present

## 2021-01-04 DIAGNOSIS — I2510 Atherosclerotic heart disease of native coronary artery without angina pectoris: Secondary | ICD-10-CM | POA: Diagnosis not present

## 2021-01-04 DIAGNOSIS — K439 Ventral hernia without obstruction or gangrene: Secondary | ICD-10-CM | POA: Diagnosis not present

## 2021-01-04 LAB — GLUCOSE, CAPILLARY: Glucose-Capillary: 101 mg/dL — ABNORMAL HIGH (ref 70–99)

## 2021-01-04 MED ORDER — FLUDEOXYGLUCOSE F - 18 (FDG) INJECTION
8.0000 | Freq: Once | INTRAVENOUS | Status: AC | PRN
  Administered 2021-01-04: 9.14 via INTRAVENOUS

## 2021-01-07 ENCOUNTER — Encounter: Payer: Self-pay | Admitting: Radiation Oncology

## 2021-01-07 ENCOUNTER — Ambulatory Visit: Payer: Medicare Other | Admitting: Radiation Oncology

## 2021-01-07 ENCOUNTER — Other Ambulatory Visit: Payer: Self-pay

## 2021-01-07 ENCOUNTER — Ambulatory Visit
Admission: RE | Admit: 2021-01-07 | Discharge: 2021-01-07 | Disposition: A | Discharge status: Home / Self Care | Payer: Medicare Other | Source: Ambulatory Visit | Attending: Radiation Oncology | Admitting: Radiation Oncology

## 2021-01-07 VITALS — BP 154/83 | HR 68 | Temp 97.6°F | Resp 20 | Ht 69.0 in | Wt 165.0 lb

## 2021-01-07 DIAGNOSIS — G8929 Other chronic pain: Secondary | ICD-10-CM | POA: Insufficient documentation

## 2021-01-07 DIAGNOSIS — C320 Malignant neoplasm of glottis: Secondary | ICD-10-CM | POA: Diagnosis not present

## 2021-01-07 DIAGNOSIS — C3411 Malignant neoplasm of upper lobe, right bronchus or lung: Secondary | ICD-10-CM | POA: Insufficient documentation

## 2021-01-07 DIAGNOSIS — N281 Cyst of kidney, acquired: Secondary | ICD-10-CM | POA: Insufficient documentation

## 2021-01-07 DIAGNOSIS — M549 Dorsalgia, unspecified: Secondary | ICD-10-CM | POA: Insufficient documentation

## 2021-01-07 DIAGNOSIS — Z79899 Other long term (current) drug therapy: Secondary | ICD-10-CM | POA: Insufficient documentation

## 2021-01-07 DIAGNOSIS — I2510 Atherosclerotic heart disease of native coronary artery without angina pectoris: Secondary | ICD-10-CM | POA: Diagnosis not present

## 2021-01-07 DIAGNOSIS — Z510 Encounter for antineoplastic radiation therapy: Secondary | ICD-10-CM | POA: Diagnosis not present

## 2021-01-07 DIAGNOSIS — I700 Atherosclerosis of aorta: Secondary | ICD-10-CM | POA: Diagnosis not present

## 2021-01-07 DIAGNOSIS — C3412 Malignant neoplasm of upper lobe, left bronchus or lung: Secondary | ICD-10-CM | POA: Diagnosis not present

## 2021-01-07 DIAGNOSIS — J984 Other disorders of lung: Secondary | ICD-10-CM | POA: Insufficient documentation

## 2021-01-07 NOTE — Progress Notes (Addendum)
Radiation Oncology         [phone] ________________________________  Name: Logan Price MRN: [id number]  Date: 01/07/2021  DOB: 27-Sep-1945  Follow-Up Visit Note in person  CC: Logan Spencer, FNP  Newman Pies, MD  Diagnosis and Prior Radiotherapy:       ICD-10-CM   1. Malignant neoplasm of right upper lobe of lung (HCC)  C34.11     2. 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 Primary cancer of left upper lobe of lung (HCC)  C34.12        Cancer Staging Squamous cell carcinoma of glottis (HCC) Staging form: Larynx - Glottis, AJCC 8th Edition - Clinical stage from 10/09/2018: Stage IVB (cT4b, cN3b, cM0) - Signed by Lonie Peak, MD on 10/09/2018   Radiation Treatment Dates: 10/22/2018 through 12/10/2018 Site Technique Total Dose Dose per Fx Completed Fx Beam Energies  Head & neck: HN_larynx IMRT 70/70 2 35/35 6X   Radiation Treatment Dates: 06/03/2019 through 06/13/2019 Site Technique Total Dose (Gy) Dose per Fx (Gy) Completed Fx Beam Energies  Lung, Right: Lung_Rt_Upper IMRT 60/60 12 5/5 6XFFF  Lung, Left: Lung_Lt_Upper IMRT 60/60 12 5/5 6XFFF    CHIEF COMPLAINT:  Here for follow-up and surveillance of throat and lung cancer   Narrative:   [name] returns for follow-up of radiation to the larynx completed on 12/10/2018 and SBRT to the right and left lungs on 06/13/2019   He is doing well.  He denies any new symptoms.  He reports chronic back pain continues, 4 out of 10 in severity  I personally reviewed his recent PET scan  Vitals:   01/07/21 1347  BP: (!) 154/83  Pulse: 68  Resp: 20  Temp: 97.6 F (36.4 C)  SpO2: 96%    ALLERGIES:  has No Known Allergies.  Meds: Current Outpatient Medications  Medication Sig Dispense Refill   doxazosin (CARDURA) 1 MG tablet Place 1 tablet (1 mg total) into feeding tube daily. (Patient taking differently: Take 1 mg by mouth daily.) 30 tablet 5   pantoprazole (PROTONIX) 40 MG tablet TAKE ONE TABLET DAILY 30  MINUTES PRIOR TO THE FIRST MEAL 90 tablet 2   simvastatin (ZOCOR) 20 MG tablet TAKE 1 TABLET DAILY AT 6PM 90 tablet 0   No current facility-administered medications for this encounter.    Physical Findings: The patient is in no acute distress. Patient is alert and oriented. Wt Readings from Last 3 Encounters:  01/07/21 165 lb (74.8 kg)  11/23/20 164 lb (74.4 kg)  10/07/20 163 lb 12.8 oz (74.3 kg)    height is 5\' 9"  (1.753 m) and weight is 165 lb (74.8 kg). His temperature is 97.6 F (36.4 C). His blood pressure is 154/83 (abnormal) and his pulse is 68. His respiration is 20 and oxygen saturation is 96%. .  General: Alert and oriented, in no acute distress.   NECK: No palpable masses in the cervical or supraclavicular neck Heart regular in rate and rhythm Chest clear to auscultation bilaterally Extremities without any edema Psychiatric: Affect appropriate, judgment and insight intact  Lab Findings: Lab Results  Component Value Date   WBC 4.4 10/07/2020   HGB 12.9 (L) 10/07/2020   HCT 39.5 10/07/2020   MCV 91 10/07/2020   PLT 185 10/07/2020    Lab Results  Component Value Date   TSH 3.472 07/20/2020    Radiographic Findings: I personally reviewed the CT scan of the chest NM PET Image Restag (  PS) Skull Base To Thigh  Result Date: 01/06/2021 CLINICAL DATA:  Follow-up treatment strategy for head neck cancer. EXAM: NUCLEAR MEDICINE PET SKULL BASE TO THIGH TECHNIQUE: 9.14 mCi F-18 FDG was injected intravenously. Full-ring PET imaging was performed from the skull base to thigh after the radiotracer. CT data was obtained and used for attenuation correction and anatomic localization. Fasting blood glucose: 101 mg/dl COMPARISON:  CT chest dated November 22, 2020; PET-CT dated April 02, 2019 FINDINGS: Mediastinal blood pool activity: SUV max 2.7 Liver activity: SUV max 3.4 NECK: Asymmetric hypermetabolic focus of the right neck which is new compared to prior PET-CT, and appears to  correlate with asymmetric soft tissue on CT measuring 1.2 x 0.8 cm on series 4, image 33 exact measurements are difficult due to lack of IV contrast, SUV max is 4.3, possibly a level IIa lymph node. Incidental CT findings: None. CHEST: Solid nodule of the right upper lobe measures 1.0 x 0.7 cm on series 4, image 60, previously 0.7 x 0.6 cm and has an SUV max 6.8.Linear nodule of the right middle lobe measures approximately 1.5 x 1.4 cm, unchanged compared to prior exam and has an SUV max of 2.5. Nodule of the left upper lobe with associated fiducial markers measures 1.1 x 0.9 cm on series 8, image 40, unchanged in size compared to prior exam, SUV max of 1.6. Mildly hypermetabolic hilar lymph nodes are unchanged compared to prior exam and likely reactive. Reference small hypermetabolic right hilar lymph node measuring 4 mm in short axis on series 4, image 81 with an SUV max of 4.8, previously 4.1. Incidental CT findings: Unchanged biapical scarring, likely post radiation changes. Atherosclerotic disease of the thoracic aorta. Three-vessel coronary artery calcifications. ABDOMEN/PELVIS: No abnormal hypermetabolic activity within the liver, pancreas, adrenal glands, or spleen. No hypermetabolic lymph nodes in the abdomen or pelvis. Incidental CT findings: Simple cyst of the right kidney small right renal hernia containing nondilated loops of small bowel atherosclerotic disease of the abdominal aorta. Focal FDG uptake of the right palm correlates with the musculature is likely physiologic. FDG uptake about the left greater trochanter, likely inflammatory. Linear uptake along the right anterior chest wall, likely physiologic. SKELETON: No focal hypermetabolic activity to suggest skeletal metastasis. Incidental CT findings: none IMPRESSION: Solid nodule of the right upper lobe is slightly increased in size compared to November 22, 2020 prior exam and demonstrates hypermetabolic activity, concerning for metastatic lesion or  primary lung malignancy. Previously treated nodules of the right middle and left upper lobe are unchanged in size compared to prior and demonstrate low level FDG uptake which is belowmediastinal blood pool, findings are compatible with post treatment changes. Asymmetric mildly hypermetabolic focus of the right neck which is new compared to prior PET-CT and appears to correlate with asymmetric soft tissue on CT, possibly a level IIa lymph node and concerning for recurrent disease. Consider neck CT with contrast for further evaluation. Coronary artery calcifications and aortic Atherosclerosis (ICD10-I70.0). Electronically Signed   By: Allegra Lai M.D.   On: 01/06/2021 09:31     Impression/Plan:    1) Disease status: Right upper lung nodule is hypermetabolic and suspicious on PET scan.  We discussed the risks and benefits of pursuing a biopsy.  He and I agree that it is prudent to proceed with treatment now (no bx) as this is highly suspicious for cancer and he has a known history of malignancy in the lung on previous occasions.  We will proceed with CT simulation  today for stereotactic body radiation therapy.  Consent form has been signed and placed in his chart.  He understands that side effects may include but not necessarily be limited to injury of the lung, injury to normal tissues of the chest,  and fatigue.  Victorino Dike RN will add this patient to tumor board so we can review his PET scan and determine if the small but mildly hypermetabolic right upper neck mass can be biopsied by interventional radiology.   This is hypermetabolic and concerning for possible recurrent disease.  2) Nutritional Status: no new issues   3) Risk Factors: The patient has been educated about risk factors including alcohol and tobacco abuse; they understand that avoidance of vaping/tobacco is important to prevent recurrences as well as other cancers, and he is abstaining  4) Swallowing: continue SLP exercises, swallowing  has improved since trach removal  5)  Thyroid function: will check annually  - WNL   Lab Results  Component Value Date   TSH 3.472 07/20/2020   6) dental: Continue following with dentistry regularly  7) chronic back pain: Musculoskeletal findings from PET/CT are reassuring  8) The patient is due for his flu shot and COVID booster.  Recommended he pursue those at his earliest convenience.     On date of service, in total, I spent 20 minutes on this encounter. Patient was seen in person.  ___   Lonie Peak, MD

## 2021-01-07 NOTE — Progress Notes (Signed)
Patient in for follow up to receive results of PET scan. Complains of back pain 4/10 intermittent.

## 2021-01-11 DIAGNOSIS — Z23 Encounter for immunization: Secondary | ICD-10-CM | POA: Diagnosis not present

## 2021-01-11 DIAGNOSIS — Z20828 Contact with and (suspected) exposure to other viral communicable diseases: Secondary | ICD-10-CM | POA: Diagnosis not present

## 2021-01-14 ENCOUNTER — Other Ambulatory Visit: Payer: Self-pay

## 2021-01-14 DIAGNOSIS — C3411 Malignant neoplasm of upper lobe, right bronchus or lung: Secondary | ICD-10-CM | POA: Diagnosis not present

## 2021-01-14 DIAGNOSIS — C320 Malignant neoplasm of glottis: Secondary | ICD-10-CM

## 2021-01-14 DIAGNOSIS — Z510 Encounter for antineoplastic radiation therapy: Secondary | ICD-10-CM | POA: Diagnosis not present

## 2021-01-18 ENCOUNTER — Encounter (HOSPITAL_COMMUNITY): Payer: Self-pay | Admitting: Radiology

## 2021-01-18 ENCOUNTER — Ambulatory Visit
Admission: RE | Admit: 2021-01-18 | Discharge: 2021-01-18 | Disposition: A | Discharge status: Home / Self Care | Payer: Medicare Other | Source: Ambulatory Visit | Attending: Radiation Oncology | Admitting: Radiation Oncology

## 2021-01-18 DIAGNOSIS — Z510 Encounter for antineoplastic radiation therapy: Secondary | ICD-10-CM | POA: Diagnosis not present

## 2021-01-18 DIAGNOSIS — C3411 Malignant neoplasm of upper lobe, right bronchus or lung: Secondary | ICD-10-CM | POA: Diagnosis not present

## 2021-01-18 NOTE — Progress Notes (Signed)
Patient Name  Logan, [name] Legal Sex  Male DOB  Feb 02, 1946 SSN  [id number] Address  [place] [address] Phone  [phone] [facility])  [phone] (Mobile) *Preferred*    RE: US Biopsy Received: 3 days ago Arne Cleveland, MD  [name], [name]   [place] FNA R cerv LAN  Anterior to carotid bifurcation   DDH        Previous Messages   ----- Message -----  From: Garth Bigness D  Sent: 01/14/2021   2:22 PM EDT  To: Ir Procedure Requests  Subject: US Biopsy                                       Procedure:   US Biopsy   Reason:  Squamous cell carcinoma of glottis, US guided biopsy of presumed right sided cervical lymph node   History:  CT, NM PET in computer   Provider:  Eppie Gibson   Provider Contact:  [phone]

## 2021-01-19 ENCOUNTER — Ambulatory Visit: Payer: Medicare Other | Admitting: Radiation Oncology

## 2021-01-20 ENCOUNTER — Other Ambulatory Visit: Payer: Self-pay

## 2021-01-20 ENCOUNTER — Ambulatory Visit
Admission: RE | Admit: 2021-01-20 | Discharge: 2021-01-20 | Disposition: A | Discharge status: Home / Self Care | Payer: Medicare Other | Source: Ambulatory Visit | Attending: Radiation Oncology | Admitting: Radiation Oncology

## 2021-01-20 DIAGNOSIS — C3411 Malignant neoplasm of upper lobe, right bronchus or lung: Secondary | ICD-10-CM | POA: Diagnosis not present

## 2021-01-20 DIAGNOSIS — Z510 Encounter for antineoplastic radiation therapy: Secondary | ICD-10-CM | POA: Diagnosis not present

## 2021-01-21 ENCOUNTER — Ambulatory Visit: Payer: Medicare Other | Admitting: Radiation Oncology

## 2021-01-24 ENCOUNTER — Other Ambulatory Visit: Payer: Self-pay

## 2021-01-24 ENCOUNTER — Telehealth: Payer: Medicare Other

## 2021-01-24 ENCOUNTER — Ambulatory Visit
Admission: RE | Admit: 2021-01-24 | Discharge: 2021-01-24 | Disposition: A | Discharge status: Home / Self Care | Payer: Medicare Other | Source: Ambulatory Visit | Attending: Radiation Oncology | Admitting: Radiation Oncology

## 2021-01-24 DIAGNOSIS — C3411 Malignant neoplasm of upper lobe, right bronchus or lung: Secondary | ICD-10-CM | POA: Diagnosis not present

## 2021-01-24 DIAGNOSIS — Z510 Encounter for antineoplastic radiation therapy: Secondary | ICD-10-CM | POA: Diagnosis not present

## 2021-01-25 ENCOUNTER — Ambulatory Visit (HOSPITAL_COMMUNITY): Payer: Medicare Other

## 2021-01-26 ENCOUNTER — Ambulatory Visit
Admission: RE | Admit: 2021-01-26 | Discharge: 2021-01-26 | Disposition: A | Discharge status: Home / Self Care | Payer: Medicare Other | Source: Ambulatory Visit | Attending: Radiation Oncology | Admitting: Radiation Oncology

## 2021-01-26 ENCOUNTER — Other Ambulatory Visit: Payer: Self-pay | Admitting: Radiology

## 2021-01-26 ENCOUNTER — Other Ambulatory Visit: Payer: Self-pay

## 2021-01-26 ENCOUNTER — Other Ambulatory Visit: Payer: Self-pay | Admitting: Internal Medicine

## 2021-01-26 DIAGNOSIS — Z510 Encounter for antineoplastic radiation therapy: Secondary | ICD-10-CM | POA: Insufficient documentation

## 2021-01-26 DIAGNOSIS — C3411 Malignant neoplasm of upper lobe, right bronchus or lung: Secondary | ICD-10-CM | POA: Insufficient documentation

## 2021-01-27 ENCOUNTER — Other Ambulatory Visit: Payer: Self-pay | Admitting: Radiation Oncology

## 2021-01-27 ENCOUNTER — Ambulatory Visit (HOSPITAL_COMMUNITY)
Admission: RE | Admit: 2021-01-27 | Discharge: 2021-01-27 | Disposition: A | Discharge status: Home / Self Care | Payer: Medicare Other | Source: Ambulatory Visit | Attending: Radiation Oncology | Admitting: Radiation Oncology

## 2021-01-27 ENCOUNTER — Encounter (HOSPITAL_COMMUNITY): Payer: Self-pay

## 2021-01-27 DIAGNOSIS — C760 Malignant neoplasm of head, face and neck: Secondary | ICD-10-CM | POA: Diagnosis not present

## 2021-01-27 DIAGNOSIS — Z8521 Personal history of malignant neoplasm of larynx: Secondary | ICD-10-CM | POA: Insufficient documentation

## 2021-01-27 DIAGNOSIS — I471 Supraventricular tachycardia: Secondary | ICD-10-CM | POA: Diagnosis not present

## 2021-01-27 DIAGNOSIS — E785 Hyperlipidemia, unspecified: Secondary | ICD-10-CM | POA: Diagnosis not present

## 2021-01-27 DIAGNOSIS — I10 Essential (primary) hypertension: Secondary | ICD-10-CM | POA: Diagnosis not present

## 2021-01-27 DIAGNOSIS — K219 Gastro-esophageal reflux disease without esophagitis: Secondary | ICD-10-CM | POA: Diagnosis not present

## 2021-01-27 DIAGNOSIS — C320 Malignant neoplasm of glottis: Secondary | ICD-10-CM

## 2021-01-27 DIAGNOSIS — R590 Localized enlarged lymph nodes: Secondary | ICD-10-CM | POA: Diagnosis not present

## 2021-01-27 DIAGNOSIS — Z87891 Personal history of nicotine dependence: Secondary | ICD-10-CM | POA: Diagnosis not present

## 2021-01-27 DIAGNOSIS — C770 Secondary and unspecified malignant neoplasm of lymph nodes of head, face and neck: Secondary | ICD-10-CM | POA: Diagnosis not present

## 2021-01-27 MED ORDER — MIDAZOLAM HCL 2 MG/2ML IJ SOLN
INTRAMUSCULAR | Status: AC | PRN
  Administered 2021-01-27: 1 mg via INTRAVENOUS

## 2021-01-27 MED ORDER — FENTANYL CITRATE (PF) 100 MCG/2ML IJ SOLN
INTRAMUSCULAR | Status: AC
  Filled 2021-01-27: qty 2

## 2021-01-27 MED ORDER — MIDAZOLAM HCL 2 MG/2ML IJ SOLN
INTRAMUSCULAR | Status: AC
  Filled 2021-01-27: qty 2

## 2021-01-27 MED ORDER — SODIUM CHLORIDE 0.9 % IV SOLN
INTRAVENOUS | Status: AC | PRN
  Administered 2021-01-27: 10 mL/h via INTRAVENOUS

## 2021-01-27 MED ORDER — LIDOCAINE HCL (PF) 1 % IJ SOLN
INTRAMUSCULAR | Status: AC
  Filled 2021-01-27: qty 30

## 2021-01-27 MED ORDER — FENTANYL CITRATE (PF) 100 MCG/2ML IJ SOLN
INTRAMUSCULAR | Status: AC | PRN
  Administered 2021-01-27: 50 ug via INTRAVENOUS

## 2021-01-27 MED ORDER — SODIUM CHLORIDE 0.9 % IV SOLN: INTRAVENOUS | Status: DC

## 2021-01-27 NOTE — H&P (Signed)
Chief Complaint: Patient was seen in consultation today for right cervical lymph node biopsy  at the request of [place]  Referring Physician(s): Eppie Gibson  Supervising Physician: Jacqulynn Cadet  Patient Status: [facility] - Out-pt  History of Present Illness: Logan Price is a 75 y.o. male with PMH of dysrhythmia (SVT), GERD, squamous cell carcinoma of glottis, HTN and HLD.  Patient had PET scan 01/06/2021 that resulted with asymmetric mildly hypermetabolic focus of the right neck concerning for recurrent disease.  Patient was referred by Dr. Eppie Gibson today for right cervical lymph node biopsy.  Pet scan 01/06/21: IMPRESSION: Solid nodule of the right upper lobe is slightly increased in size compared to November 22, 2020 prior exam and demonstrates hypermetabolic activity, concerning for metastatic lesion or primary lung malignancy.   Previously treated nodules of the right middle and left upper lobe are unchanged in size compared to prior and demonstrate low level FDG uptake which is belowmediastinal blood pool, findings are compatible with post treatment changes.   Asymmetric mildly hypermetabolic focus of the right neck which is new compared to prior PET-CT and appears to correlate with asymmetric soft tissue on CT, possibly a level IIa lymph node and concerning for recurrent disease. Consider neck CT with contrast for further evaluation.  Past Medical History:  Diagnosis Date  . Diverticulitis   . Dysrhythmia 09/2018   episode of SVT while in hospital  . 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 GERD (gastroesophageal reflux disease)   . 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 HTN (hypertension)   . Hyperlipidemia     Past Surgical History:  Procedure Laterality Date  . BIOPSY  05/04/2015   Procedure: BIOPSY;  Surgeon: Danie Binder, MD;  [facility];  Service: Endoscopy;;  bx's of ileocecal valve   . BIOPSY  06/22/2020    Procedure: BIOPSY;  Surgeon: Eloise Harman, DO;  [facility];  Service: Endoscopy;;  . BRONCHIAL BRUSHINGS  04/29/2019   Procedure: BRONCHIAL BRUSHINGS;  Surgeon: Garner Nash, DO;  Location: [place];  Service: Pulmonary;;  . BRONCHIAL NEEDLE ASPIRATION BIOPSY  04/29/2019   Procedure: BRONCHIAL NEEDLE ASPIRATION BIOPSIES;  Surgeon: Garner Nash, DO;  Location: [place];  Service: Pulmonary;;  . BRONCHIAL WASHINGS  04/29/2019   Procedure: BRONCHIAL WASHINGS;  Surgeon: Garner Nash, DO;  [facility];  Service: Pulmonary;;  . COLONOSCOPY WITH PROPOFOL N/A 05/04/2015   Dr. Oneida Alar: normal appearing ileum with prominent IC valve with tubular adenomas, moderate diverticulosis in sigmoid colon, ascending colon, and retum. Moderate sized internal hemorrhoids. Surveillance in 5 years  . COLONOSCOPY WITH PROPOFOL N/A 06/22/2020   Procedure: COLONOSCOPY WITH PROPOFOL;  Surgeon: Eloise Harman, DO;  [facility];  Service: Endoscopy;  Laterality: N/A;  PM  . ELECTROMAGNETIC NAVIGATION BROCHOSCOPY  04/29/2019   Procedure: NAVIGATION BRONCHOSCOPY;  Surgeon: Garner Nash, DO;  Location: [place] [facility];  Service: Pulmonary;;  . ESOPHAGOGASTRODUODENOSCOPY (EGD) WITH PROPOFOL N/A 12/19/2016   Procedure: ESOPHAGOGASTRODUODENOSCOPY (EGD) WITH PROPOFOL;  Surgeon: Danie Binder, MD;  [facility];  Service: Endoscopy;  Laterality: N/A;  11:30am  . FLEXIBLE SIGMOIDOSCOPY N/A 12/10/2015   hemorrhoid banding X 3   . HEMORRHOID BANDING N/A 12/10/2015   Procedure: HEMORRHOID BANDING;  Surgeon: Danie Binder, MD;  [facility];  Service: Endoscopy;  Laterality: N/A;  1:30 PM  . INSERTION OF SUPRAPUBIC CATHETER N/A 02/24/2019   Procedure: INSERTION  OF SUPRAPUBIC CATHETER;  Surgeon: Kathie Rhodes, MD;  Location: [facility];  Service: Urology;  Laterality: N/A;  . IR CM INJ ANY COLONIC TUBE W/FLUORO  10/30/2018  . IR GASTROSTOMY TUBE MOD SED  10/04/2018  .  IR GASTROSTOMY TUBE REMOVAL  10/08/2019  . IR IMAGING GUIDED PORT INSERTION  10/04/2018  . IR REMOVAL TUN ACCESS W/ PORT W/O FL MOD SED  10/14/2018  . IR REPLACE G-TUBE SIMPLE WO FLUORO  11/03/2018  . IR REPLACE G-TUBE SIMPLE WO FLUORO  07/28/2019  . LAPAROSCOPIC INSERTION GASTROSTOMY TUBE Left 10/07/2018   Procedure: LAPAROSCOPIC  GASTROSTOMY TUBE;  Surgeon: Kinsinger, Arta Bruce, MD;  Location: [place];  Service: General;  Laterality: Left;  Marland Kitchen MICROLARYNGOSCOPY N/A 09/27/2018   Procedure: MICRO DIRECT LARYNGOSCOPY WITH BIOPSY;  Surgeon: Leta Baptist, MD;  Location: [facility];  Service: ENT;  Laterality: N/A;  . None to date     As of 04/14/15  . POLYPECTOMY  05/04/2015   Procedure: POLYPECTOMY;  Surgeon: Danie Binder, MD;  [facility];  Service: Endoscopy;;  descending colon polyp, ascending colon polyp  . SAVORY DILATION N/A 12/19/2016   Procedure: SAVORY DILATION;  Surgeon: Danie Binder, MD;  [facility];  Service: Endoscopy;  Laterality: N/A;  . TRACHEOSTOMY TUBE PLACEMENT N/A 09/27/2018   Procedure: AWAKE TRACHEOSTOMY;  Surgeon: Leta Baptist, MD;  Location: [place];  Service: ENT;  Laterality: N/A;  . TRANSURETHRAL RESECTION OF PROSTATE N/A 02/24/2019   Procedure: TRANSURETHRAL RESECTION OF THE PROSTATE (TURP);  Surgeon: Kathie Rhodes, MD;  Location[facility];  Service: Urology;  Laterality: N/A;  . VIDEO BRONCHOSCOPY WITH ENDOBRONCHIAL NAVIGATION N/A 04/29/2019   Procedure: VIDEO BRONCHOSCOPY;  Surgeon: Garner Nash, DO;  Location: [place];  Service: Pulmonary;  Laterality: N/A;    Allergies: Patient has no known allergies.  Medications: Prior to Admission medications   Medication Sig Start Date End Date Taking? Authorizing Provider  doxazosin (CARDURA) 1 MG tablet Place 1 tablet (1 mg total) into feeding tube daily. Patient taking differently: Take 1 mg by mouth daily. 10/10/18  Yes Leta Baptist, MD  pantoprazole (PROTONIX) 40 MG tablet TAKE ONE TABLET DAILY 30 MINUTES PRIOR TO  THE FIRST MEAL 06/24/20  Yes Rehman, Mechele Dawley, MD  simvastatin (ZOCOR) 20 MG tablet TAKE 1 TABLET DAILY AT 6PM 12/20/20  Yes Hawks, Theador Hawthorne, FNP     Family History  Problem Relation Age of Onset  . Cancer Mother   . Hypertension Father   . Hypertension Sister   . Hypertension Brother   . Hypertension Sister   . Aneurysm Brother 49       brain  . Colon cancer Neg Hx   . Colon polyps Neg Hx     Social History   Socioeconomic History  . Marital status: Married    Spouse name: Alma  . Number of children: 9  . Years of education: GED  . Highest education level: GED or equivalent  Occupational History  . Occupation: Retired    Comment: [employer]  Tobacco Use  . Smoking status: Former    Packs/day: 1.00    Years: 40.00    Pack years: 40.00    Types: Cigarettes    Quit date: 10/09/2006    Years since quitting: 14.3  . Smokeless tobacco: Never  Vaping Use  . Vaping Use: Never used  Substance and Sexual Activity  . Alcohol use: Not Currently    Alcohol/week: 0.0 standard  drinks    Comment: he denies 01/06/19  . Drug use: No  . Sexual activity: Yes    Partners: Female    Birth control/protection: None  Other Topics Concern  . Not on file  Social History Narrative   Lives with wife      9 children      GED   Social Determinants of Health   Financial Resource Strain: Low Risk   . Difficulty of Paying Living Expenses: Not hard at all  Food Insecurity: No Food Insecurity  . Worried About Charity fundraiser in the Last Year: Never true  . Ran Out of Food in the Last Year: Never true  Transportation Needs: No Transportation Needs  . Lack of Transportation (Medical): No  . Lack of Transportation (Non-Medical): No  Physical Activity: Insufficiently Active  . Days of Exercise per Week: 1 day  . Minutes of Exercise per Session: 10 min  Stress: Stress Concern Present  . Feeling of Stress : To some extent  Social Connections: Socially Integrated  .  Frequency of Communication with Friends and Family: More than three times a week  . Frequency of Social Gatherings with Friends and Family: More than three times a week  . Attends Religious Services: More than 4 times per year  . Active Member of Clubs or Organizations: Yes  . Attends Archivist Meetings: More than 4 times per year  . Marital Status: Married      Review of Systems: A 12 point ROS discussed and pertinent positives are indicated in the HPI above.  All other systems are negative.  Review of Systems  Constitutional:  Negative for appetite change, chills and fever.  HENT:  Negative for nosebleeds.   Eyes:  Negative for visual disturbance.  Respiratory:  Negative for cough and shortness of breath.   Cardiovascular:  Negative for chest pain, palpitations and leg swelling.  Gastrointestinal:  Negative for abdominal pain, blood in stool, nausea and vomiting.  Genitourinary:  Negative for hematuria.  Neurological:  Negative for dizziness, light-headedness and headaches.   Vital Signs: BP (!) 160/84   Pulse 78   Temp 97.9 F (36.6 C) (Oral)   Resp 18   Ht 5\' 9"  (1.753 m)   Wt 160 lb (72.6 kg)   SpO2 95%   BMI 23.63 kg/m   Physical Exam Vitals reviewed.  Constitutional:      Appearance: He is ill-appearing.  HENT:     Head: Normocephalic and atraumatic.     Mouth/Throat:     Mouth: Mucous membranes are moist.     Pharynx: Oropharynx is clear.  Eyes:     Pupils: Pupils are equal, round, and reactive to light.  Cardiovascular:     Rate and Rhythm: Normal rate and regular rhythm.     Pulses: Normal pulses.     Heart sounds: Normal heart sounds. No murmur heard.   No gallop.  Pulmonary:     Effort: Pulmonary effort is normal. No respiratory distress.     Breath sounds: Normal breath sounds. No stridor. No wheezing, rhonchi or rales.  Abdominal:     General: Bowel sounds are normal. There is no distension.     Palpations: Abdomen is soft.      Tenderness: There is no abdominal tenderness. There is no guarding.  Musculoskeletal:     Right lower leg: No edema.     Left lower leg: No edema.  Skin:    General: Skin is warm and  dry.  Neurological:     Mental Status: He is alert and oriented to person, place, and time.  Psychiatric:        Mood and Affect: Mood normal.        Behavior: Behavior normal.        Thought Content: Thought content normal.        Judgment: Judgment normal.    Imaging: NM PET Image Restag (PS) Skull Base To Thigh  Result Date: 01/06/2021 CLINICAL DATA:  Follow-up treatment strategy for head neck cancer. EXAM: NUCLEAR MEDICINE PET SKULL BASE TO THIGH TECHNIQUE: 9.14 mCi F-18 FDG was injected intravenously. Full-ring PET imaging was performed from the skull base to thigh after the radiotracer. CT data was obtained and used for attenuation correction and anatomic localization. Fasting blood glucose: 101 mg/dl COMPARISON:  CT chest dated November 22, 2020; PET-CT dated April 02, 2019 FINDINGS: Mediastinal blood pool activity: SUV max 2.7 Liver activity: SUV max 3.4 NECK: Asymmetric hypermetabolic focus of the right neck which is new compared to prior PET-CT, and appears to correlate with asymmetric soft tissue on CT measuring 1.2 x 0.8 cm on series 4, image 33 exact measurements are difficult due to lack of IV contrast, SUV max is 4.3, possibly a level IIa lymph node. Incidental CT findings: None. CHEST: Solid nodule of the right upper lobe measures 1.0 x 0.7 cm on series 4, image 60, previously 0.7 x 0.6 cm and has an SUV max 6.8.Linear nodule of the right middle lobe measures approximately 1.5 x 1.4 cm, unchanged compared to prior exam and has an SUV max of 2.5. Nodule of the left upper lobe with associated fiducial markers measures 1.1 x 0.9 cm on series 8, image 40, unchanged in size compared to prior exam, SUV max of 1.6. Mildly hypermetabolic hilar lymph nodes are unchanged compared to prior exam and likely  reactive. Reference small hypermetabolic right hilar lymph node measuring 4 mm in short axis on series 4, image 81 with an SUV max of 4.8, previously 4.1. Incidental CT findings: Unchanged biapical scarring, likely post radiation changes. Atherosclerotic disease of the thoracic aorta. Three-vessel coronary artery calcifications. ABDOMEN/PELVIS: No abnormal hypermetabolic activity within the liver, pancreas, adrenal glands, or spleen. No hypermetabolic lymph nodes in the abdomen or pelvis. Incidental CT findings: Simple cyst of the right kidney small right renal hernia containing nondilated loops of small bowel atherosclerotic disease of the abdominal aorta. Focal FDG uptake of the right palm correlates with the musculature is likely physiologic. FDG uptake about the left greater trochanter, likely inflammatory. Linear uptake along the right anterior chest wall, likely physiologic. SKELETON: No focal hypermetabolic activity to suggest skeletal metastasis. Incidental CT findings: none IMPRESSION: Solid nodule of the right upper lobe is slightly increased in size compared to November 22, 2020 prior exam and demonstrates hypermetabolic activity, concerning for metastatic lesion or primary lung malignancy. Previously treated nodules of the right middle and left upper lobe are unchanged in size compared to prior and demonstrate low level FDG uptake which is belowmediastinal blood pool, findings are compatible with post treatment changes. Asymmetric mildly hypermetabolic focus of the right neck which is new compared to prior PET-CT and appears to correlate with asymmetric soft tissue on CT, possibly a level IIa lymph node and concerning for recurrent disease. Consider neck CT with contrast for further evaluation. Coronary artery calcifications and aortic Atherosclerosis (ICD10-I70.0). Electronically Signed   By: Yetta Glassman M.D.   On: 01/06/2021 09:31    Labs:  CBC: Recent Labs    04/08/20 1054 10/07/20 1030   WBC 4.1 4.4  HGB 13.4 12.9*  HCT 40.1 39.5  PLT 183 185    COAGS: No results for input(s): INR, APTT in the last 8760 hours.  BMP: Recent Labs    04/08/20 1054 10/07/20 1030  NA 140 141  K 4.5 4.0  CL 105 103  CO2 26 25  GLUCOSE 93 121*  BUN 16 12  CALCIUM 9.0 9.2  CREATININE 1.06 1.10  GFRNONAA 69  --   GFRAA 80  --     LIVER FUNCTION TESTS: Recent Labs    04/08/20 1054 10/07/20 1030  BILITOT 0.3 0.3  AST 19 23  ALT 20 18  ALKPHOS 50 53  PROT 6.6 6.4  ALBUMIN 4.4 4.2    TUMOR MARKERS: No results for input(s): AFPTM, CEA, CA199, CHROMGRNA in the last 8760 hours.  Assessment and Plan:  History of dysrhythmia (SVT), GERD, squamous cell carcinoma of glottis, HTN and HLD.  Patient had PET scan 01/06/2021 that resulted with asymmetric mildly hypermetabolic focus of the right neck concerning for recurrent disease.  Patient was referred by Dr. Eppie Gibson today for right cervical lymph node biopsy.  Pet scan 01/06/21: IMPRESSION: Solid nodule of the right upper lobe is slightly increased in size compared to November 22, 2020 prior exam and demonstrates hypermetabolic activity, concerning for metastatic lesion or primary lung malignancy.   Previously treated nodules of the right middle and left upper lobe are unchanged in size compared to prior and demonstrate low level FDG uptake which is belowmediastinal blood pool, findings are compatible with post treatment changes.   Asymmetric mildly hypermetabolic focus of the right neck which is new compared to prior PET-CT and appears to correlate with asymmetric soft tissue on CT, possibly a level IIa lymph node and concerning for recurrent disease. Consider neck CT with contrast for further evaluation.  Pt resting quietly on stretcher. He is is A&O, calm and pleasant. He is in no distress.  Pt states he is NPO per order.  No labs needed for today. VSS.   Risks and benefits of right cervical lymph node biopsy  was discussed with the patient and/or patient's family including, but not limited to bleeding, infection, damage to adjacent structures or low yield requiring additional tests.  All of the questions were answered and there is agreement to proceed.  Consent signed and in chart.   Thank you for this interesting consult.  I greatly enjoyed meeting JENNIFER HOLLAND and look forward to participating in their care.  A copy of this report was sent to the requesting provider on this date.  Electronically Signed: Tyson Alias, NP 01/27/2021, 11:23 AM   I spent a total of 30 minutes in face to face in clinical consultation, greater than 50% of which was counseling/coordinating care for right cervical lymph node biopsy.

## 2021-01-27 NOTE — Procedures (Signed)
Interventional Radiology Procedure Note  Procedure: US guided core biopsy of right neck LN  Complications: None  Estimated Blood Loss: None  Recommendations: - DC home   Signed,  Criselda Peaches, MD

## 2021-01-27 NOTE — Sedation Documentation (Signed)
Dr Laurence Ferrari aware of freuent PAC's, conducted and non conducted

## 2021-01-28 ENCOUNTER — Ambulatory Visit
Admission: RE | Admit: 2021-01-28 | Discharge: 2021-01-28 | Disposition: A | Discharge status: Home / Self Care | Payer: Medicare Other | Source: Ambulatory Visit | Attending: Radiation Oncology | Admitting: Radiation Oncology

## 2021-01-28 ENCOUNTER — Encounter: Payer: Self-pay | Admitting: Radiation Oncology

## 2021-01-28 ENCOUNTER — Other Ambulatory Visit: Payer: Self-pay

## 2021-01-28 DIAGNOSIS — C3411 Malignant neoplasm of upper lobe, right bronchus or lung: Secondary | ICD-10-CM | POA: Diagnosis not present

## 2021-01-28 DIAGNOSIS — Z510 Encounter for antineoplastic radiation therapy: Secondary | ICD-10-CM | POA: Diagnosis not present

## 2021-01-28 LAB — SURGICAL PATHOLOGY

## 2021-01-31 NOTE — Progress Notes (Signed)
Oncology Nurse Navigator Documentation   I called and spoke to Logan Price at the request of Dr. Isidore Moos. He was already aware that his recent biopsy of his neck showed cancer. I let him know that I have placed a referral to an [place] ENT surgeon for evaluation. He voiced his understanding and appreciation of my phone call and knows that he should be receiving a call from [place] for an appointment soon. He knows he can call me with any questions or concerns at any time.  Harlow Asa RN, BSN, OCN Head & Neck Oncology Nurse [place] at [facility] Phone # [phone]  Fax # [phone]

## 2021-02-07 DIAGNOSIS — Z20828 Contact with and (suspected) exposure to other viral communicable diseases: Secondary | ICD-10-CM | POA: Diagnosis not present

## 2021-02-08 DIAGNOSIS — C801 Malignant (primary) neoplasm, unspecified: Secondary | ICD-10-CM | POA: Diagnosis not present

## 2021-02-08 DIAGNOSIS — C770 Secondary and unspecified malignant neoplasm of lymph nodes of head, face and neck: Secondary | ICD-10-CM | POA: Diagnosis not present

## 2021-02-08 DIAGNOSIS — C329 Malignant neoplasm of larynx, unspecified: Secondary | ICD-10-CM | POA: Diagnosis not present

## 2021-02-15 DIAGNOSIS — R221 Localized swelling, mass and lump, neck: Secondary | ICD-10-CM | POA: Insufficient documentation

## 2021-02-16 DIAGNOSIS — Z923 Personal history of irradiation: Secondary | ICD-10-CM | POA: Diagnosis not present

## 2021-02-16 DIAGNOSIS — R221 Localized swelling, mass and lump, neck: Secondary | ICD-10-CM | POA: Diagnosis not present

## 2021-02-16 DIAGNOSIS — C329 Malignant neoplasm of larynx, unspecified: Secondary | ICD-10-CM | POA: Diagnosis not present

## 2021-02-16 DIAGNOSIS — Z87891 Personal history of nicotine dependence: Secondary | ICD-10-CM | POA: Diagnosis not present

## 2021-02-16 DIAGNOSIS — C3490 Malignant neoplasm of unspecified part of unspecified bronchus or lung: Secondary | ICD-10-CM | POA: Diagnosis not present

## 2021-02-23 ENCOUNTER — Other Ambulatory Visit (INDEPENDENT_AMBULATORY_CARE_PROVIDER_SITE_OTHER): Payer: Self-pay | Admitting: Internal Medicine

## 2021-02-23 DIAGNOSIS — K219 Gastro-esophageal reflux disease without esophagitis: Secondary | ICD-10-CM

## 2021-03-01 DIAGNOSIS — Z20822 Contact with and (suspected) exposure to covid-19: Secondary | ICD-10-CM | POA: Diagnosis not present

## 2021-03-02 DIAGNOSIS — Z20822 Contact with and (suspected) exposure to covid-19: Secondary | ICD-10-CM | POA: Diagnosis not present

## 2021-03-03 ENCOUNTER — Other Ambulatory Visit: Payer: Self-pay

## 2021-03-03 DIAGNOSIS — Z1329 Encounter for screening for other suspected endocrine disorder: Secondary | ICD-10-CM

## 2021-03-04 ENCOUNTER — Ambulatory Visit
Admission: RE | Admit: 2021-03-04 | Discharge: 2021-03-04 | Disposition: A | Discharge status: Home / Self Care | Payer: Medicare Other | Source: Ambulatory Visit | Attending: Radiation Oncology | Admitting: Radiation Oncology

## 2021-03-04 ENCOUNTER — Other Ambulatory Visit: Payer: Self-pay

## 2021-03-04 ENCOUNTER — Encounter: Payer: Self-pay | Admitting: Radiation Oncology

## 2021-03-04 VITALS — BP 159/89 | HR 76 | Temp 97.7°F | Resp 20 | Ht 69.0 in | Wt 169.0 lb

## 2021-03-04 DIAGNOSIS — Z79899 Other long term (current) drug therapy: Secondary | ICD-10-CM | POA: Diagnosis not present

## 2021-03-04 DIAGNOSIS — Z1329 Encounter for screening for other suspected endocrine disorder: Secondary | ICD-10-CM | POA: Insufficient documentation

## 2021-03-04 DIAGNOSIS — Z85118 Personal history of other malignant neoplasm of bronchus and lung: Secondary | ICD-10-CM | POA: Insufficient documentation

## 2021-03-04 DIAGNOSIS — C3411 Malignant neoplasm of upper lobe, right bronchus or lung: Secondary | ICD-10-CM | POA: Insufficient documentation

## 2021-03-04 DIAGNOSIS — C3492 Malignant neoplasm of unspecified part of left bronchus or lung: Secondary | ICD-10-CM

## 2021-03-04 DIAGNOSIS — M549 Dorsalgia, unspecified: Secondary | ICD-10-CM | POA: Insufficient documentation

## 2021-03-04 DIAGNOSIS — Z8521 Personal history of malignant neoplasm of larynx: Secondary | ICD-10-CM | POA: Insufficient documentation

## 2021-03-04 DIAGNOSIS — C3412 Malignant neoplasm of upper lobe, left bronchus or lung: Secondary | ICD-10-CM

## 2021-03-04 DIAGNOSIS — Z923 Personal history of irradiation: Secondary | ICD-10-CM | POA: Insufficient documentation

## 2021-03-04 DIAGNOSIS — C320 Malignant neoplasm of glottis: Secondary | ICD-10-CM

## 2021-03-04 LAB — TSH: TSH: 3.886 u[IU]/mL (ref 0.320–4.118)

## 2021-03-04 NOTE — Progress Notes (Signed)
Logan Price returns for follow-up after most recent completion of radiation to the right upper lung on 01/28/2021 (history of radiation to the larynx completed on 12/10/2018 and SBRT to the right and left lungs on 06/13/2019)  Pain issues, if any: Patient denies Using a feeding tube?: N/A Weight changes, if any:  Wt Readings from Last 3 Encounters:  03/04/21 169 lb (76.7 kg)  01/27/21 160 lb (72.6 kg)  01/07/21 165 lb (74.8 kg)   Swallowing issues, if any: Patient denies. Reports a healthy appetite and states he can eat/drink a wide variety SOB: Patient denies any worsening symptoms Cough: Reports an occasional productive cough; states sputum is white Smoking or chewing tobacco? None Using fluoride trays daily? N/A--continues with routine F/U with his community dentist Last ENT visit was on: 02/16/2021 Saw Dr. Fenton Malling  Other notable issues, if any: Denies any ear or jaw pain. Denies any lingering dry mouth or thick saliva. Denies trouble sleeping or struggling with fatigue during the day. Denies any skin changes or new rashes. Overall reports he feels good and is doing well

## 2021-03-04 NOTE — Progress Notes (Signed)
Radiation Oncology         [phone] ________________________________  Name: Logan Price MRN: [id number]  Date: 03/04/2021  DOB: 04-27-1945  Follow-Up Visit Note in person  CC: [name], FNP  Newman Pies, MD  Diagnosis and Prior Radiotherapy:       ICD-10-CM   1. Malignant neoplasm of right upper lobe of lung (HCC)  C34.11     2. 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 Primary cancer of left upper lobe of lung (HCC)  C34.12     4. Adenocarcinoma, lung, left (HCC)  C34.92         Cancer Staging  Squamous cell carcinoma of glottis (HCC) Staging form: Larynx - Glottis, AJCC 8th Edition - Clinical stage from 10/09/2018: Stage IVB (cT4b, cN3b, cM0) - Signed by Lonie Peak, MD on 10/09/2018   Radiation Treatment Dates: 10/22/2018 through 12/10/2018 Site Technique Total Dose Dose per Fx Completed Fx Beam Energies  Head & neck: HN_larynx IMRT 70/70 2 35/35 6X   Radiation Treatment Dates: 06/03/2019 through 06/13/2019 Site Technique Total Dose (Gy) Dose per Fx (Gy) Completed Fx Beam Energies  Lung, Right: Lung_Rt_Upper IMRT 60/60 12 5/5 6XFFF  Lung, Left: Lung_Lt_Upper IMRT 60/60 12 5/5 6XFFF   Radiation Treatment Dates: 01/18/2021 through 01/28/2021 Site Technique Total Dose (Gy) Dose per Fx (Gy) Completed Fx Beam Energies  Lung, Right: Lung_Rt_Upper IMRT 60/60 12 5/5 6XFFF    CHIEF COMPLAINT:  Here for follow-up and surveillance of throat and lung cancer   Narrative:    Mr. Logan Price returns for follow-up after most recent completion of radiation to the right upper lung on 01/28/2021 (history of radiation to the larynx completed on 12/10/2018 and SBRT to the right and left lungs on 06/13/2019)  Pain issues, if any: Patient denies Using a feeding tube?: N/A Weight changes, if any:  Wt Readings from Last 3 Encounters:  03/04/21 169 lb (76.7 kg)  01/27/21 160 lb (72.6 kg)  01/07/21 165 lb (74.8 kg)   Swallowing issues, if any: Patient denies. Reports a  healthy appetite and states he can eat/drink a wide variety SOB: Patient denies any worsening symptoms Cough: Reports an occasional productive cough; states sputum is white Smoking or chewing tobacco? None Using fluoride trays daily? N/A--continues with routine F/U with his community dentist Last ENT visit was on: 02/16/2021 Saw Dr. Jacalyn Lefevre  Other notable issues, if any: Denies any ear or jaw pain. Denies any lingering dry mouth or thick saliva. Denies trouble sleeping or struggling with fatigue during the day. Denies any skin changes or new rashes. Overall reports he feels good and is doing well  Neck dissection scheduled for early January.  The patient is here with his wife today and they feel quite about this plan.   He feels well overall.  ALLERGIES:  has No Known Allergies.  Meds: Current Outpatient Medications  Medication Sig Dispense Refill   doxazosin (CARDURA) 1 MG tablet Place 1 tablet (1 mg total) into feeding tube daily. (Patient taking differently: Take 1 mg by mouth daily.) 30 tablet 5   pantoprazole (PROTONIX) 40 MG tablet TAKE ONE TABLET DAILY 30 MINUTES PRIOR TO THE FIRST MEAL 90 tablet 1   simvastatin (ZOCOR) 20 MG tablet TAKE 1 TABLET DAILY AT 6PM 90 tablet 0   No current facility-administered medications for this encounter.    Physical Findings: The patient is in no acute distress. Patient is alert and oriented. Wt Readings from  Last 3 Encounters:  03/04/21 169 lb (76.7 kg)  01/27/21 160 lb (72.6 kg)  01/07/21 165 lb (74.8 kg)    height is 5\' 9"  (1.753 m) and weight is 169 lb (76.7 kg). His temperature is 97.7 F (36.5 C). His blood pressure is 159/89 (abnormal) and his pulse is 76. His respiration is 20 and oxygen saturation is 98%. .  General: Alert and oriented, in no acute distress.   NECK: No obvious palpable masses in the cervical or supraclavicular neck Heart regular in rate and rhythm Chest clear to auscultation bilaterally Extremities without any  edema Psychiatric: Affect appropriate, judgment and insight intact  Lab Findings: Lab Results  Component Value Date   WBC 4.4 10/07/2020   HGB 12.9 (L) 10/07/2020   HCT 39.5 10/07/2020   MCV 91 10/07/2020   PLT 185 10/07/2020    Lab Results  Component Value Date   TSH 3.886 03/04/2021    Radiographic Findings:   No results found.   Impression/Plan:    1) Disease status: He has tolerated SBRT to his right upper lung and will undergo neck dissection to salvage a recurrent node in the right neck in January.  I will order CT imaging of the neck and chest with contrast to take place in 3 months for continued surveillance   2) Nutritional Status: no new issues   3) Risk Factors: The patient has been educated about risk factors including alcohol and tobacco abuse; they understand that avoidance of vaping/tobacco is important to prevent recurrences as well as other cancers, and he is abstaining  4) Swallowing: continue SLP exercises, swallowing has improved since trach removal  5)  Thyroid function: will check annually  - WNL   Lab Results  Component Value Date   TSH 3.886 03/04/2021   6) dental: Continue following with dentistry regularly  7) chronic back pain: Musculoskeletal findings from PET/CT are reassuring  8) We discussed the importance of staying up-to-date with vaccinations including COVID vaccination and flu vaccination.    9) Followup:   I will order CT imaging of the neck and chest with contrast to take place in 3 months for continued surveillance   On date of service, in total, I spent 20 minutes on this encounter. Patient was seen in person.  ___   Lonie Peak, MD

## 2021-03-17 ENCOUNTER — Ambulatory Visit (INDEPENDENT_AMBULATORY_CARE_PROVIDER_SITE_OTHER): Payer: Medicare Other | Admitting: Licensed Clinical Social Worker

## 2021-03-17 DIAGNOSIS — C3492 Malignant neoplasm of unspecified part of left bronchus or lung: Secondary | ICD-10-CM

## 2021-03-17 DIAGNOSIS — E559 Vitamin D deficiency, unspecified: Secondary | ICD-10-CM

## 2021-03-17 DIAGNOSIS — K219 Gastro-esophageal reflux disease without esophagitis: Secondary | ICD-10-CM

## 2021-03-17 DIAGNOSIS — I10 Essential (primary) hypertension: Secondary | ICD-10-CM

## 2021-03-17 DIAGNOSIS — E785 Hyperlipidemia, unspecified: Secondary | ICD-10-CM

## 2021-03-17 DIAGNOSIS — I4891 Unspecified atrial fibrillation: Secondary | ICD-10-CM

## 2021-03-17 NOTE — Chronic Care Management (AMB) (Signed)
Chronic Care Management    Clinical Social Work Note  03/17/2021 Name: Logan Price MRN: [id number] DOB: Oct 04, 1945  Logan Price is a 75 y.o. year old male who is a primary care patient of [name], FNP. The CCM team was consulted to assist the patient with chronic disease management and/or care coordination needs related to: [name] .   Engaged with patient by telephone for follow up visit in response to provider referral for social work chronic care management and care coordination services.   Consent to Services:  The patient was given information about Chronic Care Management services, agreed to services, and gave verbal consent prior to initiation of services.  Please see initial visit note for detailed documentation.   Patient agreed to services and consent obtained.   Assessment: Review of patient past medical history, allergies, medications, and health status, including review of relevant consultants reports was performed today as part of a comprehensive evaluation and provision of chronic care management and care coordination services.     SDOH (Social Determinants of Health) assessments and interventions performed:  SDOH Interventions    Flowsheet Row Most Recent Value  SDOH Interventions   Physical Activity Interventions Other (Comments)  [walking challenges. uses a cane to help him walk]  Stress Interventions Provide Counseling  [client has stress related to managing medical conditions]  Depression Interventions/Treatment  --  [informed client of LCSW support and of RNCM support]        Advanced Directives Status: See Vynca application for related entries.  CCM Care Plan  No Known Allergies  Outpatient Encounter Medications as of 03/17/2021  Medication Sig   doxazosin (CARDURA) 1 MG tablet Place 1 tablet (1 mg total) into feeding tube daily. (Patient taking differently: Take 1 mg by mouth daily.)   pantoprazole (PROTONIX) 40 MG tablet TAKE ONE  TABLET DAILY 30 MINUTES PRIOR TO THE FIRST MEAL   simvastatin (ZOCOR) 20 MG tablet TAKE 1 TABLET DAILY AT 6PM   No facility-administered encounter medications on file as of 03/17/2021.    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   Pleural effusion    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HCC)    Normocytic anemia    PSVT (paroxysmal supraventricular tachycardia) (HCC) 10/03/2018   Atrial fibrillation (HCC) 10/03/2018   Dysphagia    Loss of weight    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 Monitor client mobility issues.   Care Plan : LCSW care plan  Updates made by Logan Blakes, LCSW since  03/17/2021 12:00 AM     Problem: Coping Skills (General Plan of Care)      Goal: Manage mobility issues, manage pain issues and work to complete ADLs daily   Start Date: 03/17/2021  Expected End Date: 06/13/2021  This Visit's Progress: On track  Recent Progress: On track  Priority: Medium  Note:   Current barriers:    Patient in need of assistance with connecting to community resources for helping client find needed resources to help him with managing mobility and ADLs completion Patient is unable to independently navigate community resource options without care coordination support Adenocarcinoma of Left Lung Pain issues Mobility issues  Clinical Goals:  Patient will communicate with LCSW in next 30 days to discuss community resources of possible help to client Patient to communicate with LCSW in next 30 days to discuss client mobility and pain issues of client Patient to communicate with RNCM  in next 30 days as needed to discuss nursing needs of client  Clinical Interventions:  Collaboration with [name], FNP regarding development and update of comprehensive plan of care as evidenced by provider attestation and co-signature Discussed pain issues of client (client has pain in his back) Discussed upcoming client appointments. Client said he has a medical procedure scheduled at [facility] in [place], [place] for 04/05/21. Reviewed appetite of client and reviewed sleeping issues of client.  Discussed mood status of client. Client said he was doing well with his mood. He is pleased with scheduled appointment on 04/05/21 at [facility]. Encouraged client to call RNCM as needed for nursing support    Patient Strengths:  Attends scheduled medical appointments Takes medications as prescribed Has support from spouse Drives to needed appointments  Patient Deficits:  Pain issues and mobility issues  Patient Goals:   To attend scheduled medical appointments To take medications as prescribed To communicate regularly with his spouse to discuss client needs To communicate with RNCM or LCSW as needed for CCM support   Follow Up Plan: LCSW to call client or spouse of client on 05/13/21 at 1:30 PM to assess client needs.       Logan Price.Lakeeta Dobosz MSW, LCSW Licensed Clinical Social  Worker [facility] Care Management [phone]

## 2021-03-17 NOTE — Progress Notes (Signed)
Patient Name: Logan Price MRN: [id number] DOB: 07/04/1945 Referring Physician: Suszanne Conners SUI (Profile Not Attached) Date of Service: 01/28/2021 [facility], [place]                                                        End Of Treatment Note  Diagnoses: C32.0-Malignant neoplasm of glottis C34.11-Malignant neoplasm of upper lobe, right bronchus or lung C34.12-Malignant neoplasm of upper lobe, left bronchus or lung Z85.21-Personal history of malignant neoplasm of larynx  Cancer Staging:  Cancer Staging  Squamous cell carcinoma of glottis (HCC) Staging form: Larynx - Glottis, AJCC 8th Edition - Clinical stage from 10/09/2018: Stage IVB (cT4b, cN3b, cM0) - Signed by Lonie Peak, MD on 10/09/2018  Locally advanced laryngeal cancer with stage I lung cancer vs oligometastasic disease  Intent: Curative  Radiation Treatment Dates: 01/18/2021 through 01/28/2021 Site Technique Total Dose (Gy) Dose per Fx (Gy) Completed Fx Beam Energies  Lung, Right: Lung_Rt_Upper IMRT 60/60 12 5/5 6XFFF   Narrative: The patient tolerated radiation therapy relatively well.   Plan: The patient will follow-up with radiation oncology in 82mo .   -----------------------------------  Lonie Peak, MD

## 2021-03-17 NOTE — Patient Instructions (Addendum)
Visit Information  Patient Goals:  Protect My Health (Patient). Patient will work to improve mobility, manage pain issues and complete ADLs daily  Timeframe:  Short-Term Goal Priority:  Medium Progress: On Track Start Date:     03/17/21                        Expected End Date:    06/13/21                 Follow Up Date   05/13/21 at 1:30 PM   Protect My Health (Patient) Patient will work to improve mobility, manage pain issues and complete ADLs daily   Why is this important?   Screening tests can find diseases early when they are easier to treat.  Your doctor or nurse will talk with you about which tests are important for you.  Getting shots for common diseases like the flu and shingles will help prevent them.     Patient Strengths:  Attends scheduled medical appointments Takes medications as prescribed Has support from spouse Drives to needed appointments  Patient Deficits:  Pain issues and mobility issues  Patient Goals:   To attend scheduled medical appointments To take medications as prescribed To communicate regularly with his spouse to discuss client needs To communicate with RNCM or LCSW as needed for CCM support To participate in physical therapy sessions as scheduled -  Follow Up Plan: LCSW to call client or spouse of client on 05/13/21 at 1:30 PM to assess client needs   Norva Riffle.Tarez Bowns MSW, LCSW Licensed Clinical Social Worker [facility] Care Management [phone]

## 2021-03-19 ENCOUNTER — Other Ambulatory Visit: Payer: Self-pay | Admitting: Family

## 2021-03-19 DIAGNOSIS — E785 Hyperlipidemia, unspecified: Secondary | ICD-10-CM

## 2021-03-29 DIAGNOSIS — E785 Hyperlipidemia, unspecified: Secondary | ICD-10-CM | POA: Diagnosis not present

## 2021-03-29 DIAGNOSIS — N400 Enlarged prostate without lower urinary tract symptoms: Secondary | ICD-10-CM | POA: Diagnosis not present

## 2021-03-29 DIAGNOSIS — K219 Gastro-esophageal reflux disease without esophagitis: Secondary | ICD-10-CM | POA: Diagnosis not present

## 2021-03-29 DIAGNOSIS — Z87891 Personal history of nicotine dependence: Secondary | ICD-10-CM | POA: Diagnosis not present

## 2021-03-29 DIAGNOSIS — C329 Malignant neoplasm of larynx, unspecified: Secondary | ICD-10-CM | POA: Diagnosis not present

## 2021-03-29 DIAGNOSIS — Z8249 Family history of ischemic heart disease and other diseases of the circulatory system: Secondary | ICD-10-CM | POA: Diagnosis not present

## 2021-03-29 DIAGNOSIS — C7989 Secondary malignant neoplasm of other specified sites: Secondary | ICD-10-CM | POA: Diagnosis not present

## 2021-03-29 DIAGNOSIS — I10 Essential (primary) hypertension: Secondary | ICD-10-CM | POA: Diagnosis not present

## 2021-03-29 DIAGNOSIS — Z79899 Other long term (current) drug therapy: Secondary | ICD-10-CM | POA: Diagnosis not present

## 2021-03-30 ENCOUNTER — Telehealth: Payer: Self-pay | Admitting: Family

## 2021-03-30 DIAGNOSIS — I440 Atrioventricular block, first degree: Secondary | ICD-10-CM | POA: Diagnosis not present

## 2021-03-30 NOTE — Telephone Encounter (Signed)
°  Incoming Patient Call  03/30/2021  What symptoms do you have? Diarrhea. Told her patient needed appt and wanted me to put message in to [place].  How long have you been sick? Since Sunday  Have you been seen for this problem? NO  If your provider decides to give you a prescription, which pharmacy would you like for it to be sent to? [facility] in [place]   Patient informed that this information will be sent to the clinical staff for review and that they should receive a follow up call.

## 2021-03-30 NOTE — Telephone Encounter (Signed)
Patient denies any other symptoms he is aware to try some imodium otc. Patient verbalized understanding

## 2021-04-05 DIAGNOSIS — I10 Essential (primary) hypertension: Secondary | ICD-10-CM | POA: Diagnosis not present

## 2021-04-05 DIAGNOSIS — Z87891 Personal history of nicotine dependence: Secondary | ICD-10-CM | POA: Diagnosis not present

## 2021-04-05 DIAGNOSIS — I440 Atrioventricular block, first degree: Secondary | ICD-10-CM | POA: Diagnosis not present

## 2021-04-05 DIAGNOSIS — K219 Gastro-esophageal reflux disease without esophagitis: Secondary | ICD-10-CM | POA: Diagnosis not present

## 2021-04-05 DIAGNOSIS — N400 Enlarged prostate without lower urinary tract symptoms: Secondary | ICD-10-CM | POA: Diagnosis not present

## 2021-04-05 DIAGNOSIS — C770 Secondary and unspecified malignant neoplasm of lymph nodes of head, face and neck: Secondary | ICD-10-CM | POA: Diagnosis not present

## 2021-04-05 DIAGNOSIS — C329 Malignant neoplasm of larynx, unspecified: Secondary | ICD-10-CM | POA: Diagnosis not present

## 2021-04-05 DIAGNOSIS — E785 Hyperlipidemia, unspecified: Secondary | ICD-10-CM | POA: Diagnosis not present

## 2021-04-05 DIAGNOSIS — C7989 Secondary malignant neoplasm of other specified sites: Secondary | ICD-10-CM | POA: Diagnosis not present

## 2021-04-05 DIAGNOSIS — Z79899 Other long term (current) drug therapy: Secondary | ICD-10-CM | POA: Diagnosis not present

## 2021-04-05 DIAGNOSIS — C760 Malignant neoplasm of head, face and neck: Secondary | ICD-10-CM | POA: Diagnosis not present

## 2021-04-05 DIAGNOSIS — Z8249 Family history of ischemic heart disease and other diseases of the circulatory system: Secondary | ICD-10-CM | POA: Diagnosis not present

## 2021-04-05 DIAGNOSIS — I2109 ST elevation (STEMI) myocardial infarction involving other coronary artery of anterior wall: Secondary | ICD-10-CM | POA: Diagnosis not present

## 2021-04-06 DIAGNOSIS — I2109 ST elevation (STEMI) myocardial infarction involving other coronary artery of anterior wall: Secondary | ICD-10-CM | POA: Diagnosis not present

## 2021-04-06 DIAGNOSIS — I440 Atrioventricular block, first degree: Secondary | ICD-10-CM | POA: Diagnosis not present

## 2021-04-11 ENCOUNTER — Encounter: Payer: Self-pay | Admitting: Family

## 2021-04-11 ENCOUNTER — Ambulatory Visit (INDEPENDENT_AMBULATORY_CARE_PROVIDER_SITE_OTHER): Payer: Medicare Other | Admitting: Family

## 2021-04-11 VITALS — BP 135/77 | HR 97 | Temp 99.1°F | Ht 69.0 in | Wt 167.0 lb

## 2021-04-11 DIAGNOSIS — Z923 Personal history of irradiation: Secondary | ICD-10-CM | POA: Diagnosis not present

## 2021-04-11 DIAGNOSIS — N138 Other obstructive and reflux uropathy: Secondary | ICD-10-CM | POA: Diagnosis not present

## 2021-04-11 DIAGNOSIS — G8929 Other chronic pain: Secondary | ICD-10-CM | POA: Diagnosis not present

## 2021-04-11 DIAGNOSIS — K219 Gastro-esophageal reflux disease without esophagitis: Secondary | ICD-10-CM

## 2021-04-11 DIAGNOSIS — M5441 Lumbago with sciatica, right side: Secondary | ICD-10-CM | POA: Diagnosis not present

## 2021-04-11 DIAGNOSIS — I10 Essential (primary) hypertension: Secondary | ICD-10-CM

## 2021-04-11 DIAGNOSIS — M5442 Lumbago with sciatica, left side: Secondary | ICD-10-CM

## 2021-04-11 DIAGNOSIS — I4891 Unspecified atrial fibrillation: Secondary | ICD-10-CM | POA: Diagnosis not present

## 2021-04-11 DIAGNOSIS — E785 Hyperlipidemia, unspecified: Secondary | ICD-10-CM | POA: Diagnosis not present

## 2021-04-11 DIAGNOSIS — N401 Enlarged prostate with lower urinary tract symptoms: Secondary | ICD-10-CM

## 2021-04-11 NOTE — Patient Instructions (Signed)
Weakness Weakness is a lack of strength. You may feel weak all over your body (generalized), or you may feel weak in one specific part of your body (focal). Common causes of weakness include: Infection and immune system disorders. Physical exhaustion. Internal bleeding or other blood loss that results in a lack of red blood cells (anemia). Dehydration. An imbalance in mineral (electrolyte) levels, such as potassium. Heart disease, circulation problems, or stroke. Other causes include: Some medicines or cancer treatment. Stress, anxiety, or depression. Nervous system disorders. Thyroid disorders. Loss of muscle strength because of age or inactivity. Poor sleep quality or sleep disorders. The cause of your weakness may not be known. Some causes of weakness can be serious, so it is important to see your health care provider. Follow these instructions at home: Activity Rest as needed. Try to get enough sleep. Most adults need 7-8 hours of quality sleep each night. Talk to your health care provider about how much sleep you need each night. Do exercises, such as arm curls and leg raises, for 30 minutes at least 2 days a week or as told by your health care provider. This helps build muscle strength. Consider working with a physical therapist or trainer who can develop an exercise plan to help you gain muscle strength. General instructions  Take over-the-counter and prescription medicines only as told by your health care provider. Eat a healthy, well-balanced diet. This includes: Proteins to build muscles, such as lean meats and fish. Fresh fruits and vegetables. Carbohydrates to boost energy, such as whole grains. Drink enough fluid to keep your urine pale yellow. Keep all follow-up visits as told by your health care provider. This is important. Contact a health care provider if your weakness: Does not improve or gets worse. Affects your ability to think clearly. Affects your ability to do  your normal daily activities. Get help right away if you: Develop sudden weakness, especially on one side of your face or body. Have chest pain. Have trouble breathing or shortness of breath. Have problems with your vision. Have trouble talking or swallowing. Have trouble standing or walking. Are light-headed or lose consciousness. Summary Weakness is a lack of strength. You may feel weak all over your body or just in one specific part of your body. Weakness can be caused by a variety of things. In some cases, the cause may be unknown. Rest as needed, and try to get enough sleep. Most adults need 7-8 hours of quality sleep each night. Eat a healthy, well-balanced diet. This information is not intended to replace advice given to you by your health care provider. Make sure you discuss any questions you have with your health care provider. Document Revised: 10/17/2017 Document Reviewed: 10/17/2017 Elsevier Patient Education  [employer].

## 2021-04-11 NOTE — Progress Notes (Signed)
Subjective:    Patient ID: Logan Price, male    DOB: [birth year], 76 y.o.   MRN: [id number]  Chief Complaint  Patient presents with   Medical Management of Chronic Issues   Pt presents to the office today for chronic follow up. 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 On 04/05/21 he had neck mass removed and has staples intact.    He is followed by ENT and had his trach reversal in March 2021.  He is currently on a regular diet.     Pt has seen Ortho for chronic back pain and is currently getting steroid injections and completed PT. He reports his pain constantly a 5 out 10.    PT complaining of generalized weakness. His wife is scared he is going to fall.  Hypertension This is a chronic problem. The current episode started more than 1 year ago. The problem has been waxing and waning since onset. The problem is uncontrolled. Associated symptoms include malaise/fatigue. Pertinent negatives include no peripheral edema or shortness of breath. Risk factors for coronary artery disease include dyslipidemia and male gender. The current treatment provides moderate improvement.  Gastroesophageal Reflux He complains of belching and heartburn. This is a chronic problem. The current episode started more than 1 year ago. The problem occurs occasionally. He has tried a PPI for the symptoms. The treatment provided moderate relief.  Benign Prostatic Hypertrophy This is a chronic problem. The current episode started more than 1 year ago. The problem has been waxing and waning since onset. Irritative symptoms include nocturia (3-4).  Hyperlipidemia This is a chronic problem. The current episode started more than 1 year ago. Pertinent negatives include no shortness of breath. Current antihyperlipidemic treatment includes statins. The current treatment provides moderate improvement of lipids. Risk factors for coronary artery disease include dyslipidemia,  hypertension, male sex and a sedentary lifestyle.     Review of Systems  Constitutional:  Positive for malaise/fatigue.  Respiratory:  Negative for shortness of breath.   Gastrointestinal:  Positive for heartburn.  Genitourinary:  Positive for nocturia (3-4).  All other systems reviewed and are negative.     Objective:   Physical Exam Vitals reviewed.  Constitutional:      General: He is not in acute distress.    Appearance: He is well-developed.  HENT:     Head: Normocephalic.     Right Ear: Tympanic membrane normal.     Left Ear: Tympanic membrane normal.  Eyes:     General:        Right eye: No discharge.        Left eye: No discharge.     Pupils: Pupils are equal, round, and reactive to light.  Neck:     Thyroid: No thyromegaly.  Cardiovascular:     Rate and Rhythm: Normal rate and regular rhythm.     Heart sounds: Normal heart sounds. No murmur heard. Pulmonary:     Effort: Pulmonary effort is normal. No respiratory distress.     Breath sounds: Normal breath sounds. No wheezing.  Abdominal:     General: Bowel sounds are normal. There is no distension.     Palpations: Abdomen is soft.     Tenderness: There is no abdominal tenderness.  Musculoskeletal:        General: No tenderness. Normal range of motion.     Cervical back: Normal range of motion and neck supple.  Skin:    General: Skin is  warm and dry.     Findings: No erythema or rash.          Comments: Staples intact  Neurological:     Mental Status: He is alert and oriented to person, place, and time.     Cranial Nerves: No cranial nerve deficit.     Deep Tendon Reflexes: Reflexes are normal and symmetric.  Psychiatric:        Behavior: Behavior normal.        Thought Content: Thought content normal.        Judgment: Judgment normal.      BP 135/77    Pulse 97    Temp 99.1 F (37.3 C) (Temporal)    Ht 5\' 9"  (1.753 m)    Wt 167 lb (75.8 kg)    BMI 24.66 kg/m      Assessment & Plan:  SHUAYB BIRSCHBACH comes in today with chief complaint of Medical Management of Chronic Issues   Diagnosis and orders addressed:  1. Atrial fibrillation, unspecified type [facility]) - Ambulatory referral to Home Health  2. Primary hypertension - Ambulatory referral to Home Health  3. Gastroesophageal reflux disease, unspecified whether esophagitis present - Ambulatory referral to Home Health  4. BPH with urinary obstruction - Ambulatory referral to Home Health  5. Hyperlipidemia, unspecified hyperlipidemia type - Ambulatory referral to Home Health  6. Hx of radiation therapy - Ambulatory referral to Home Health  7. Chronic bilateral low back pain with bilateral sciatica - Ambulatory referral to Home Health   Labs pending Health Maintenance reviewed Diet and exercise encouraged  Follow up plan: 6 months and keep appt with specialists    [name], FNP

## 2021-04-14 DIAGNOSIS — Z4802 Encounter for removal of sutures: Secondary | ICD-10-CM | POA: Diagnosis not present

## 2021-04-14 DIAGNOSIS — C760 Malignant neoplasm of head, face and neck: Secondary | ICD-10-CM | POA: Diagnosis not present

## 2021-04-14 DIAGNOSIS — Z87891 Personal history of nicotine dependence: Secondary | ICD-10-CM | POA: Diagnosis not present

## 2021-04-14 DIAGNOSIS — Z483 Aftercare following surgery for neoplasm: Secondary | ICD-10-CM | POA: Diagnosis not present

## 2021-04-20 ENCOUNTER — Encounter: Payer: Self-pay | Admitting: Family Medicine

## 2021-04-20 ENCOUNTER — Ambulatory Visit (INDEPENDENT_AMBULATORY_CARE_PROVIDER_SITE_OTHER): Payer: Medicare Other | Admitting: Family Medicine

## 2021-04-20 DIAGNOSIS — U071 COVID-19: Secondary | ICD-10-CM | POA: Diagnosis not present

## 2021-04-20 MED ORDER — BENZONATATE 100 MG PO CAPS: 100.0000 mg | ORAL_CAPSULE | Freq: Three times a day (TID) | ORAL | 0 refills | Status: AC | PRN

## 2021-04-20 MED ORDER — MOLNUPIRAVIR EUA 200MG CAPSULE: 4.0000 | ORAL_CAPSULE | Freq: Two times a day (BID) | ORAL | 0 refills | Status: AC

## 2021-04-20 NOTE — Progress Notes (Signed)
Telephone visit  Subjective: Logan Price PCP: Sharion Balloon, FNP Logan Price is a 76 y.o. male calls for telephone consult today. Patient provides verbal consent for consult held via phone.  Due to COVID-19 pandemic this visit was conducted virtually. This visit type was conducted due to national recommendations for restrictions regarding the COVID-19 Pandemic (e.g. social distancing, sheltering in place) in an effort to limit this patient's exposure and mitigate transmission in our community. All issues noted in this document were discussed and addressed.  A physical exam was not performed with this format.   Location of patient: home Location of provider: [facility] Others present for call: spouse  1. COVID-19 Patient reports positive by home test.  His wife is sick with symptoms so he took a test. He is having mild cough.  Denies fevers, shortness of breath, wheezing, diarrhea, nausea, vomiting, myalgias.  Tolerating p.o. intake without difficulty   ROS: Per HPI  No Known Allergies 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   HTN (hypertension)    Hyperlipidemia     Current Outpatient Medications:    bacitracin 500 UNIT/GM ointment, Apply to incision twice a day for 5 days, Disp: , Rfl:    celecoxib (CELEBREX) 200 MG capsule, Take by mouth 2 (two) times daily., Disp: , Rfl:    doxazosin (CARDURA) 1 MG tablet, Place 1 tablet (1 mg total) into feeding tube daily. (Patient taking differently: Take 1 mg by mouth daily.), Disp: 30 tablet, Rfl: 5   pantoprazole (PROTONIX) 40 MG tablet, TAKE ONE TABLET DAILY 30 MINUTES PRIOR TO THE FIRST MEAL, Disp: 90 tablet, Rfl: 1   simvastatin (ZOCOR) 20 MG tablet, TAKE 1 TABLET DAILY AT 6PM, Disp: 90 tablet, Rfl: 0  Assessment/ Plan: 76 y.o.  male   COVID-19 - Plan: molnupiravir EUA (LAGEVRIO) 200 mg CAPS capsule, benzonatate (TESSALON PERLES) 100 MG capsule  COVID-19 positive by home test.  Wife sick with similar.  Patient is certainly high risk and we will initiate Molnupiravir as a result.  Tessalon Perles sent for as needed use.  We discussed red flag signs and symptoms warranting further evaluation.  Both he and his wife was good understanding  Start time: 12:03pm End time: 12:11pm  Total time spent on patient care (including telephone call/ virtual visit): 8 minutes  [place], [place] [phone]

## 2021-05-02 ENCOUNTER — Telehealth: Payer: Self-pay | Admitting: Family

## 2021-05-03 ENCOUNTER — Telehealth: Payer: Self-pay | Admitting: Family

## 2021-05-03 NOTE — Telephone Encounter (Signed)
Logan Price is call back on the message she left yesterday about [name]'s getting HH at home to help with him. Please call.

## 2021-05-03 NOTE — Telephone Encounter (Signed)
Patient aware that it is being checked on

## 2021-05-03 NOTE — Telephone Encounter (Signed)
Patient aware and verbalized understanding. °

## 2021-05-03 NOTE — Telephone Encounter (Signed)
Ok

## 2021-05-03 NOTE — Telephone Encounter (Signed)
Message already in for [facility]

## 2021-05-05 DIAGNOSIS — M545 Low back pain, unspecified: Secondary | ICD-10-CM | POA: Diagnosis not present

## 2021-05-05 DIAGNOSIS — N401 Enlarged prostate with lower urinary tract symptoms: Secondary | ICD-10-CM | POA: Diagnosis not present

## 2021-05-05 DIAGNOSIS — I4891 Unspecified atrial fibrillation: Secondary | ICD-10-CM | POA: Diagnosis not present

## 2021-05-05 DIAGNOSIS — I10 Essential (primary) hypertension: Secondary | ICD-10-CM | POA: Diagnosis not present

## 2021-05-05 DIAGNOSIS — M5431 Sciatica, right side: Secondary | ICD-10-CM | POA: Diagnosis not present

## 2021-05-05 DIAGNOSIS — M5432 Sciatica, left side: Secondary | ICD-10-CM | POA: Diagnosis not present

## 2021-05-05 DIAGNOSIS — K219 Gastro-esophageal reflux disease without esophagitis: Secondary | ICD-10-CM | POA: Diagnosis not present

## 2021-05-05 DIAGNOSIS — Z923 Personal history of irradiation: Secondary | ICD-10-CM | POA: Diagnosis not present

## 2021-05-05 DIAGNOSIS — Z9181 History of falling: Secondary | ICD-10-CM | POA: Diagnosis not present

## 2021-05-05 DIAGNOSIS — G8929 Other chronic pain: Secondary | ICD-10-CM | POA: Diagnosis not present

## 2021-05-05 DIAGNOSIS — E785 Hyperlipidemia, unspecified: Secondary | ICD-10-CM | POA: Diagnosis not present

## 2021-05-05 DIAGNOSIS — C320 Malignant neoplasm of glottis: Secondary | ICD-10-CM | POA: Diagnosis not present

## 2021-05-09 ENCOUNTER — Other Ambulatory Visit: Payer: Self-pay

## 2021-05-09 ENCOUNTER — Ambulatory Visit (INDEPENDENT_AMBULATORY_CARE_PROVIDER_SITE_OTHER): Payer: Medicare Other

## 2021-05-09 DIAGNOSIS — I4891 Unspecified atrial fibrillation: Secondary | ICD-10-CM | POA: Diagnosis not present

## 2021-05-09 DIAGNOSIS — I10 Essential (primary) hypertension: Secondary | ICD-10-CM | POA: Diagnosis not present

## 2021-05-09 DIAGNOSIS — C320 Malignant neoplasm of glottis: Secondary | ICD-10-CM | POA: Diagnosis not present

## 2021-05-09 DIAGNOSIS — M545 Low back pain, unspecified: Secondary | ICD-10-CM | POA: Diagnosis not present

## 2021-05-09 DIAGNOSIS — Z9181 History of falling: Secondary | ICD-10-CM

## 2021-05-09 DIAGNOSIS — M5431 Sciatica, right side: Secondary | ICD-10-CM

## 2021-05-09 DIAGNOSIS — Z923 Personal history of irradiation: Secondary | ICD-10-CM

## 2021-05-09 DIAGNOSIS — G8929 Other chronic pain: Secondary | ICD-10-CM | POA: Diagnosis not present

## 2021-05-09 DIAGNOSIS — M5432 Sciatica, left side: Secondary | ICD-10-CM | POA: Diagnosis not present

## 2021-05-09 DIAGNOSIS — E785 Hyperlipidemia, unspecified: Secondary | ICD-10-CM | POA: Diagnosis not present

## 2021-05-09 DIAGNOSIS — N401 Enlarged prostate with lower urinary tract symptoms: Secondary | ICD-10-CM

## 2021-05-09 DIAGNOSIS — K219 Gastro-esophageal reflux disease without esophagitis: Secondary | ICD-10-CM | POA: Diagnosis not present

## 2021-05-10 ENCOUNTER — Telehealth: Payer: Self-pay | Admitting: *Deleted

## 2021-05-10 DIAGNOSIS — I10 Essential (primary) hypertension: Secondary | ICD-10-CM | POA: Diagnosis not present

## 2021-05-10 DIAGNOSIS — M5432 Sciatica, left side: Secondary | ICD-10-CM | POA: Diagnosis not present

## 2021-05-10 DIAGNOSIS — M5431 Sciatica, right side: Secondary | ICD-10-CM | POA: Diagnosis not present

## 2021-05-10 DIAGNOSIS — I4891 Unspecified atrial fibrillation: Secondary | ICD-10-CM | POA: Diagnosis not present

## 2021-05-10 DIAGNOSIS — M545 Low back pain, unspecified: Secondary | ICD-10-CM | POA: Diagnosis not present

## 2021-05-10 DIAGNOSIS — C320 Malignant neoplasm of glottis: Secondary | ICD-10-CM | POA: Diagnosis not present

## 2021-05-10 MED ORDER — ONDANSETRON HCL 4 MG PO TABS: 4.0000 mg | ORAL_TABLET | Freq: Three times a day (TID) | ORAL | 0 refills | Status: AC | PRN

## 2021-05-10 NOTE — Telephone Encounter (Signed)
Patient aware and verbalized understanding. °

## 2021-05-10 NOTE — Telephone Encounter (Signed)
Wife called stating patient has been vomiting since last night. Asking what she can give him to help. No known allergies. Advised OTC emetrol. Advised to try to keep him hydrated with foods and liquids as tolerated.

## 2021-05-10 NOTE — Telephone Encounter (Signed)
Zofran Prescription sent to pharmacy

## 2021-05-11 ENCOUNTER — Telehealth: Payer: Self-pay | Admitting: Family

## 2021-05-11 NOTE — Telephone Encounter (Signed)
Patient started taking ondansetron (ZOFRAN) 4 MG tablet yesterday and now he is having diarrhea. Wants to know if he is able to take Imodium. Please call back.

## 2021-05-11 NOTE — Telephone Encounter (Signed)
I do not see a barrier to him taking Imodium a couple of times a day if he needs to.  I would caution that if he has any evidence of food poisoning or viral illness, this may prolong his diarrheal illness even though it will make it less intense.  Push oral hydration.

## 2021-05-11 NOTE — Telephone Encounter (Signed)
Is there any medical reason he is not able to take imodium?  Covering pcp

## 2021-05-11 NOTE — Telephone Encounter (Signed)
Patient aware and verbalizes understanding. 

## 2021-05-12 DIAGNOSIS — M5432 Sciatica, left side: Secondary | ICD-10-CM | POA: Diagnosis not present

## 2021-05-12 DIAGNOSIS — M5431 Sciatica, right side: Secondary | ICD-10-CM | POA: Diagnosis not present

## 2021-05-12 DIAGNOSIS — C320 Malignant neoplasm of glottis: Secondary | ICD-10-CM | POA: Diagnosis not present

## 2021-05-12 DIAGNOSIS — M545 Low back pain, unspecified: Secondary | ICD-10-CM | POA: Diagnosis not present

## 2021-05-12 DIAGNOSIS — I4891 Unspecified atrial fibrillation: Secondary | ICD-10-CM | POA: Diagnosis not present

## 2021-05-12 DIAGNOSIS — I10 Essential (primary) hypertension: Secondary | ICD-10-CM | POA: Diagnosis not present

## 2021-05-13 ENCOUNTER — Telehealth: Payer: Medicare Other

## 2021-05-16 DIAGNOSIS — C329 Malignant neoplasm of larynx, unspecified: Secondary | ICD-10-CM | POA: Diagnosis not present

## 2021-05-17 ENCOUNTER — Ambulatory Visit (INDEPENDENT_AMBULATORY_CARE_PROVIDER_SITE_OTHER): Payer: Medicare Other

## 2021-05-17 VITALS — Wt 167.0 lb

## 2021-05-17 DIAGNOSIS — Z0000 Encounter for general adult medical examination without abnormal findings: Secondary | ICD-10-CM | POA: Diagnosis not present

## 2021-05-17 NOTE — Progress Notes (Signed)
Subjective:   Logan Price is a 76 y.o. male who presents for Medicare Annual/Subsequent preventive examination.  Virtual Visit via Telephone Note  I connected with  Logan Price on 05/17/21 at  2:45 PM EST by telephone and verified that I am speaking with the correct person using two identifiers.  Location: Patient: Home Provider: [facility] Persons participating in the virtual visit: patient/Nurse Health Advisor   I discussed the limitations, risks, security and privacy concerns of performing an evaluation and management service by telephone and the availability of in person appointments. The patient expressed understanding and agreed to proceed.  Interactive audio and video telecommunications were attempted between this nurse and patient, however failed, due to patient having technical difficulties OR patient did not have access to video capability.  We continued and completed visit with audio only.  Some vital signs may be absent or patient reported.   Burney Calzadilla E Dezra Mandella, LPN   Review of Systems     Cardiac Risk Factors include: advanced age (>44men, >74 women);male gender;hypertension;Other (see comment);smoking/ tobacco exposure, Risk factor comments: hx lung cancer, A.Fib, atherosclerosis, emphysema     Objective:    Today's Vitals   05/17/21 1426  Weight: 167 lb (75.8 kg)  PainSc: 5    Body mass index is 24.66 kg/m.  Advanced Directives 05/17/2021 03/04/2021 01/27/2021 11/23/2020 10/25/2020 07/20/2020 06/18/2020  Does Patient Have a Medical Advance Directive? Yes No Yes No Yes No Yes  Type of Paramedic of [place];Living will - Living will - - - Living will  Does patient want to make changes to medical advance directive? - - No - Patient declined - No - Patient declined - No - Patient declined  Copy of [place] in Chart? No - copy requested - - - - - No - copy requested  Would patient like information on creating a medical advance  directive? - No - Patient declined - No - Patient declined - No - Patient declined -    Current Medications (verified) Outpatient Encounter Medications as of 05/17/2021  Medication Sig   benzonatate (TESSALON PERLES) 100 MG capsule Take 1 capsule (100 mg total) by mouth 3 (three) times daily as needed.   doxazosin (CARDURA) 1 MG tablet Place 1 tablet (1 mg total) into feeding tube daily. (Patient taking differently: Take 1 mg by mouth daily.)   ondansetron (ZOFRAN) 4 MG tablet Take 1 tablet (4 mg total) by mouth every 8 (eight) hours as needed for nausea or vomiting.   pantoprazole (PROTONIX) 40 MG tablet TAKE ONE TABLET DAILY 30 MINUTES PRIOR TO THE FIRST MEAL   simvastatin (ZOCOR) 20 MG tablet TAKE 1 TABLET DAILY AT 6PM   No facility-administered encounter medications on file as of 05/17/2021.    Allergies (verified) Patient has no known allergies.   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   HTN (hypertension)    Hyperlipidemia    Past Surgical History:  Procedure Laterality Date   BIOPSY  05/04/2015   Procedure: BIOPSY;  Surgeon: Danie Binder, MD;  [facility];  Service: Endoscopy;;  bx's of ileocecal valve    BIOPSY  06/22/2020   Procedure: BIOPSY;  Surgeon: Eloise Harman, DO;  [facility];  Service: Endoscopy;;   BRONCHIAL BRUSHINGS  04/29/2019   Procedure: BRONCHIAL BRUSHINGS;  Surgeon: Garner Nash, DO;  Location: [place];  Service: Pulmonary;;   BRONCHIAL NEEDLE ASPIRATION BIOPSY  04/29/2019   Procedure: BRONCHIAL NEEDLE ASPIRATION BIOPSIES;  Surgeon: Garner Nash, DO;  Location: [place];  Service: Pulmonary;;   BRONCHIAL WASHINGS  04/29/2019   Procedure: BRONCHIAL WASHINGS;  Surgeon: Garner Nash, DO;  [facility];  Service: Pulmonary;;   COLONOSCOPY WITH PROPOFOL N/A 05/04/2015   Dr. Oneida Alar: normal appearing ileum with prominent IC valve with tubular adenomas, moderate diverticulosis in sigmoid colon, ascending colon, and retum. Moderate sized internal hemorrhoids. Surveillance in 5 years   COLONOSCOPY WITH PROPOFOL N/A 06/22/2020   Procedure: COLONOSCOPY WITH PROPOFOL;  Surgeon: Eloise Harman, DO;  [facility];  Service: Endoscopy;  Laterality: N/A;  PM   ELECTROMAGNETIC NAVIGATION BROCHOSCOPY  04/29/2019   Procedure: NAVIGATION BRONCHOSCOPY;  Surgeon: Garner Nash, DO;  Location: [facility];  Service: Pulmonary;;   ESOPHAGOGASTRODUODENOSCOPY (EGD) WITH PROPOFOL N/A 12/19/2016   Procedure: ESOPHAGOGASTRODUODENOSCOPY (EGD) WITH PROPOFOL;  Surgeon: Danie Binder, MD;  [facility];  Service: Endoscopy;  Laterality: N/A;  11:30am   FLEXIBLE SIGMOIDOSCOPY N/A 12/10/2015   hemorrhoid banding X 3    HEMORRHOID BANDING N/A 12/10/2015   Procedure: HEMORRHOID BANDING;  Surgeon: Danie Binder, MD;  [facility];  Service: Endoscopy;  Laterality: N/A;  1:30 PM   INSERTION OF SUPRAPUBIC CATHETER N/A 02/24/2019   Procedure: INSERTION OF SUPRAPUBIC CATHETER;  Surgeon: Kathie Rhodes, MD;  [facility];  Service: Urology;  Laterality: N/A;   IR CM INJ ANY COLONIC TUBE W/FLUORO  10/30/2018   IR GASTROSTOMY TUBE MOD SED  10/04/2018   IR GASTROSTOMY TUBE REMOVAL  10/08/2019   IR IMAGING GUIDED PORT INSERTION  10/04/2018   IR REMOVAL TUN ACCESS W/ PORT W/O FL MOD SED  10/14/2018   IR REPLACE G-TUBE SIMPLE WO FLUORO  11/03/2018   IR REPLACE G-TUBE SIMPLE WO FLUORO  07/28/2019   LAPAROSCOPIC INSERTION GASTROSTOMY TUBE Left 10/07/2018   Procedure: LAPAROSCOPIC  GASTROSTOMY TUBE;  Surgeon: Mickeal Skinner, MD;  Location: [place];  Service: General;  Laterality: Left;   MICROLARYNGOSCOPY N/A 09/27/2018   Procedure: MICRO DIRECT LARYNGOSCOPY WITH BIOPSY;  Surgeon: Leta Baptist, MD;   [facility];  Service: ENT;  Laterality: N/A;   None to date     As of 04/14/15   POLYPECTOMY  05/04/2015   Procedure: POLYPECTOMY;  Surgeon: Danie Binder, MD;  [facility];  Service: Endoscopy;;  descending colon polyp, ascending colon polyp   SAVORY DILATION N/A 12/19/2016   Procedure: SAVORY DILATION;  Surgeon: Danie Binder, MD;  [facility];  Service: Endoscopy;  Laterality: N/A;   TRACHEOSTOMY TUBE PLACEMENT N/A 09/27/2018   Procedure: AWAKE TRACHEOSTOMY;  Surgeon: Leta Baptist, MD;  Location: [place];  Service: ENT;  Laterality: N/A;   TRANSURETHRAL RESECTION OF PROSTATE N/A 02/24/2019   Procedure: TRANSURETHRAL RESECTION OF THE PROSTATE (TURP);  Surgeon: Kathie Rhodes, MD;  [facility];  Service: Urology;  Laterality: N/A;   VIDEO BRONCHOSCOPY WITH ENDOBRONCHIAL NAVIGATION N/A 04/29/2019   Procedure: VIDEO BRONCHOSCOPY;  Surgeon: Garner Nash, DO;  Location: [place];  Service: Pulmonary;  Laterality: N/A;   Family History  Problem Relation Age of Onset   Cancer Mother    Hypertension Father    Hypertension Sister  Hypertension Brother    Hypertension Sister    Aneurysm Brother 22       brain   Colon cancer Neg Hx    Colon polyps Neg Hx    Social History   Socioeconomic History   Marital status: Married    Spouse name: [name]   Number of children: 9   Years of education: GED   Highest education level: GED or equivalent  Occupational History   Occupation: Retired    Comment: [employer]  Tobacco Use   Smoking status: Former    Packs/day: 1.00    Years: 40.00    Pack years: 40.00    Types: Cigarettes    Quit date: 10/09/2006    Years since quitting: 14.6   Smokeless tobacco: Never  Vaping Use   Vaping Use: Never used  Substance and Sexual Activity   Alcohol use: Not Currently    Alcohol/week: 0.0 standard drinks    Comment: he denies 01/06/19   Drug use: No   Sexual activity: Yes    Partners: Female     Birth control/protection: None  Other Topics Concern   Not on file  Social History Narrative   Lives with wife   Most of family lives in [place]   9 children      GED   Social Determinants of Health   Financial Resource Strain: Low Risk    Difficulty of Paying Living Expenses: Not hard at all  Food Insecurity: No Food Insecurity   Worried About Charity fundraiser in the Last Year: Never true   Arboriculturist in the Last Year: Never true  Transportation Needs: No Transportation Needs   Lack of Transportation (Medical): No   Lack of Transportation (Non-Medical): No  Physical Activity: Sufficiently Active   Days of Exercise per Week: 5 days   Minutes of Exercise per Session: 60 min  Stress: No Stress Concern Present   Feeling of Stress : Only a little  Social Connections: Moderately Integrated   Frequency of Communication with Friends and Family: More than three times a week   Frequency of Social Gatherings with Friends and Family: More than three times a week   Attends Religious Services: Never   Marine scientist or Organizations: Yes   Attends Music therapist: More than 4 times per year   Marital Status: Married    Tobacco Counseling Counseling given: Not Answered   Clinical Intake:  Pre-visit preparation completed: Yes  Pain : 0-10 Pain Score: 5  Pain Type: Chronic pain Pain Location: Back Pain Orientation: Lower Pain Descriptors / Indicators: Aching Pain Onset: More than a month ago Pain Frequency: Intermittent     BMI - recorded: 24.66 Nutritional Status: BMI of 19-24  Normal Nutritional Risks: None Diabetes: No  How often do you need to have someone help you when you read instructions, pamphlets, or other written materials from your doctor or pharmacy?: 1 - Never  Diabetic? no  Interpreter Needed?: No  Information entered by :: Al Gagen, LPN   Activities of Daily Living In your present state of health, do you have any  difficulty performing the following activities: 05/17/2021 06/18/2020  Hearing? N N  Vision? N N  Difficulty concentrating or making decisions? N N  Walking or climbing stairs? N N  Dressing or bathing? N N  Doing errands, shopping? N N  Preparing Food and eating ? N -  In the past six months, have  you accidently leaked urine? N -  Do you have problems with loss of bowel control? N -  Managing your Medications? N -  Managing your Finances? N -  Housekeeping or managing your Housekeeping? N -  Some recent data might be hidden    Patient Care Team: Sharion Balloon, FNP as PCP - General (Nurse Practitioner) Danie Binder, MD (Inactive) as Consulting Physician (Gastroenterology) Eppie Gibson, MD as Attending Physician (Radiation Oncology) Leota Sauers, RN (Inactive) as Oncology Nurse Navigator Leta Baptist, MD as Consulting Physician (Otolaryngology) Katha Cabal, LCSW as [place] Management (Licensed Clinical Social Worker) [name], Elon Alas, DO as Consulting Physician (Internal Medicine)  Indicate any recent Medical Services you may have received from other than [facility] providers in the past year (date may be approximate).     Assessment:   This is a routine wellness examination for Logan Price.  Hearing/Vision screen Hearing Screening - Comments:: Denies hearing difficulties   Vision Screening - Comments:: Wears rx glasses - up to date with routine eye exams with [facility] in [place] issues and exercise activities discussed: Current Exercise Habits: Home exercise routine, Type of exercise: walking;stretching, Time (Minutes): 60, Frequency (Times/Week): 5, Weekly Exercise (Minutes/Week): 300, Intensity: Mild, Exercise limited by: cardiac condition(s);respiratory conditions(s)   Goals Addressed             This Visit's Progress    Exercise 150 min/wk Moderate Activity   On track      Depression Screen PHQ 2/9 Scores 05/17/2021  03/17/2021 12/10/2020 11/02/2020 10/07/2020 09/17/2020 08/17/2020  PHQ - 2 Score 0 2 2 0 0 0 0  PHQ- 9 Score - 5 4 2  - 2 2    Fall Risk Fall Risk  05/17/2021 10/07/2020 05/14/2020 04/08/2020 10/07/2019  Falls in the past year? 0 0 0 0 0  Number falls in past yr: 0 - - - -  Injury with Fall? 0 - - - -  Risk for fall due to : Orthopedic patient;Impaired balance/gait - - - -  Follow up Falls prevention discussed - - - -    FALL RISK PREVENTION PERTAINING TO THE HOME:  Any stairs in or around the home? Yes  If so, are there any without handrails? No  Home free of loose throw rugs in walkways, pet beds, electrical cords, etc? Yes  Adequate lighting in your home to reduce risk of falls? Yes   ASSISTIVE DEVICES UTILIZED TO PREVENT FALLS:  Life alert? No  Use of a cane, walker or w/c? Yes  Grab bars in the bathroom? Yes  Shower chair or bench in shower? No  Elevated toilet seat or a handicapped toilet? No   TIMED UP AND GO:  Was the test performed? No . Telephonic visit  Cognitive Function: Normal cognitive status assessed by direct observation by this Nurse Health Advisor. No abnormalities found.   MMSE - Mini Mental State Exam 05/09/2018 04/30/2017  Orientation to time 5 5  Orientation to Place 5 5  Registration 3 3  Attention/ Calculation 5 5  Recall 3 3  Language- name 2 objects 2 2  Language- repeat 1 1  Language- follow 3 step command 3 3  Language- read & follow direction 1 1  Write a sentence 1 1  Copy design 1 1  Total score 30 30     6CIT Screen 05/14/2020 05/12/2019  What Year? 0 points 0 points  What month? 0 points 0 points  What time?  0 points 0 points  Count back from 20 0 points 0 points  Months in reverse 0 points 0 points  Repeat phrase 2 points 0 points  Total Score 2 0    Immunizations Immunization History  Administered Date(s) Administered   Fluad Quad(high Dose 65+) 12/24/2019   Hep A / Hep B 06/03/2015, 07/06/2015, 01/04/2016   PFIZER(Purple  Top)SARS-COV-2 Vaccination 05/17/2019, 06/10/2019, 12/24/2019   Pneumococcal Conjugate-13 09/03/2013   Pneumococcal Polysaccharide-23 10/09/2014   Tdap 03/04/2010, 10/07/2020    TDAP status: Up to date  Flu Vaccine status: Up to date  Pneumococcal vaccine status: Up to date  Covid-19 vaccine status: Completed vaccines  Qualifies for Shingles Vaccine? Yes   Zostavax completed No   Shingrix Completed?: No.    Education has been provided regarding the importance of this vaccine. Patient has been advised to call insurance company to determine out of pocket expense if they have not yet received this vaccine. Advised may also receive vaccine at local pharmacy or Health Dept. Verbalized acceptance and understanding.  Screening Tests Health Maintenance  Topic Date Due   COVID-19 Vaccine (4 - Booster for Pfizer series) 02/18/2020   Zoster Vaccines- Shingrix (1 of 2) 07/10/2021 (Originally 03/03/1965)   COLONOSCOPY (Pts 45-31yrs Insurance coverage will need to be confirmed)  06/23/2030   TETANUS/TDAP  10/08/2030   Pneumonia Vaccine 34+ Years old  Completed   Hepatitis C Screening  Completed   HPV VACCINES  Aged Out   INFLUENZA VACCINE  Discontinued    Health Maintenance  Health Maintenance Due  Topic Date Due   COVID-19 Vaccine (4 - Booster for [place] series) 02/18/2020    Colorectal cancer screening: No longer required.   Lung Cancer Screening: (Low Dose CT Chest recommended if Age 32-80 years, 30 pack-year currently smoking OR have quit w/in 15years.) does not qualify.   Additional Screening:  Hepatitis C Screening: does qualify; Completed 04/21/2016  Vision Screening: Recommended annual ophthalmology exams for early detection of glaucoma and other disorders of the eye. Is the patient up to date with their annual eye exam?  Yes  Who is the provider or what is the name of the office in which the patient attends annual eye exams? [facility] [place] If pt is not established  with a provider, would they like to be referred to a provider to establish care? No .   Dental Screening: Recommended annual dental exams for proper oral hygiene  Community Resource Referral / Chronic Care Management: CRR required this visit?  No   CCM required this visit?  No      Plan:     I have personally reviewed and noted the following in the patients chart:   Medical and social history Use of alcohol, tobacco or illicit drugs  Current medications and supplements including opioid prescriptions. Patient is not currently taking opioid prescriptions. Functional ability and status Nutritional status Physical activity Advanced directives List of other physicians Hospitalizations, surgeries, and ER visits in previous 12 months Vitals Screenings to include cognitive, depression, and falls Referrals and appointments  In addition, I have reviewed and discussed with patient certain preventive protocols, quality metrics, and best practice recommendations. A written personalized care plan for preventive services as well as general preventive health recommendations were provided to patient.     Sandrea Hammond, LPN   [id number]/89/[id number]   Nurse Notes: None

## 2021-05-17 NOTE — Patient Instructions (Signed)
Logan Price , Thank you for taking time to come for your Medicare Wellness Visit. I appreciate your ongoing commitment to your health goals. Please review the following plan we discussed and let me know if I can assist you in the future.   Screening recommendations/referrals: Colonoscopy: Done 06/22/2020 - no repeat required Recommended yearly ophthalmology/optometry visit for glaucoma screening and checkup Recommended yearly dental visit for hygiene and checkup  Vaccinations: Influenza vaccine: Done 11/2020 - Repeat annually  Pneumococcal vaccine: Done 09/03/2013 & 10/09/2014 Tdap vaccine: Done 10/07/2020 - Repeat in 10 years Shingles vaccine: Declined   Covid-19: Done 05/17/2019, 06/10/2019, 12/24/2019, & 12/2020  Advanced directives: Please bring a copy of your health care power of attorney and living will to the office to be added to your chart at your convenience.   Conditions/risks identified: Aim for 30 minutes of exercise or brisk walking each day, drink 6-8 glasses of water and eat lots of fruits and vegetables.  Next appointment: Follow up in one year for your annual wellness visit.   Preventive Care 52 Years and Older, Male  Preventive care refers to lifestyle choices and visits with your health care provider that can promote health and wellness. What does preventive care include? A yearly physical exam. This is also called an annual well check. Dental exams once or twice a year. Routine eye exams. Ask your health care provider how often you should have your eyes checked. Personal lifestyle choices, including: Daily care of your teeth and gums. Regular physical activity. Eating a healthy diet. Avoiding tobacco and drug use. Limiting alcohol use. Practicing safe sex. Taking low doses of aspirin every day. Taking vitamin and mineral supplements as recommended by your health care provider. What happens during an annual well check? The services and screenings done by your health  care provider during your annual well check will depend on your age, overall health, lifestyle risk factors, and family history of disease. Counseling  Your health care provider may ask you questions about your: Alcohol use. Tobacco use. Drug use. Emotional well-being. Home and relationship well-being. Sexual activity. Eating habits. History of falls. Memory and ability to understand (cognition). Work and work Statistician. Screening  You may have the following tests or measurements: Height, weight, and BMI. Blood pressure. Lipid and cholesterol levels. These may be checked every 5 years, or more frequently if you are over 61 years old. Skin check. Lung cancer screening. You may have this screening every year starting at age 48 if you have a 30-pack-year history of smoking and currently smoke or have quit within the past 15 years. Fecal occult blood test (FOBT) of the stool. You may have this test every year starting at age 6. Flexible sigmoidoscopy or colonoscopy. You may have a sigmoidoscopy every 5 years or a colonoscopy every 10 years starting at age 51. Prostate cancer screening. Recommendations will vary depending on your family history and other risks. Hepatitis C blood test. Hepatitis B blood test. Sexually transmitted disease (STD) testing. Diabetes screening. This is done by checking your blood sugar (glucose) after you have not eaten for a while (fasting). You may have this done every 1-3 years. Abdominal aortic aneurysm (AAA) screening. You may need this if you are a current or former smoker. Osteoporosis. You may be screened starting at age 5 if you are at high risk. Talk with your health care provider about your test results, treatment options, and if necessary, the need for more tests. Vaccines  Your health care provider may  recommend certain vaccines, such as: Influenza vaccine. This is recommended every year. Tetanus, diphtheria, and acellular pertussis (Tdap, Td)  vaccine. You may need a Td booster every 10 years. Zoster vaccine. You may need this after age 11. Pneumococcal 13-valent conjugate (PCV13) vaccine. One dose is recommended after age 31. Pneumococcal polysaccharide (PPSV23) vaccine. One dose is recommended after age 80. Talk to your health care provider about which screenings and vaccines you need and how often you need them. This information is not intended to replace advice given to you by your health care provider. Make sure you discuss any questions you have with your health care provider. Document Released: 04/09/2015 Document Revised: 12/01/2015 Document Reviewed: 01/12/2015 Elsevier Interactive Patient Education  2017 [employer] Prevention in the Home Falls can cause injuries. They can happen to people of all ages. There are many things you can do to make your home safe and to help prevent falls. What can I do on the outside of my home? Regularly fix the edges of walkways and driveways and fix any cracks. Remove anything that might make you trip as you walk through a door, such as a raised step or threshold. Trim any bushes or trees on the path to your home. Use bright outdoor lighting. Clear any walking paths of anything that might make someone trip, such as rocks or tools. Regularly check to see if handrails are loose or broken. Make sure that both sides of any steps have handrails. Any raised decks and porches should have guardrails on the edges. Have any leaves, snow, or ice cleared regularly. Use sand or salt on walking paths during winter. Clean up any spills in your garage right away. This includes oil or grease spills. What can I do in the bathroom? Use night lights. Install grab bars by the toilet and in the tub and shower. Do not use towel bars as grab bars. Use non-skid mats or decals in the tub or shower. If you need to sit down in the shower, use a plastic, non-slip stool. Keep the floor dry. Clean up any  water that spills on the floor as soon as it happens. Remove soap buildup in the tub or shower regularly. Attach bath mats securely with double-sided non-slip rug tape. Do not have throw rugs and other things on the floor that can make you trip. What can I do in the bedroom? Use night lights. Make sure that you have a light by your bed that is easy to reach. Do not use any sheets or blankets that are too big for your bed. They should not hang down onto the floor. Have a firm chair that has side arms. You can use this for support while you get dressed. Do not have throw rugs and other things on the floor that can make you trip. What can I do in the kitchen? Clean up any spills right away. Avoid walking on wet floors. Keep items that you use a lot in easy-to-reach places. If you need to reach something above you, use a strong step stool that has a grab bar. Keep electrical cords out of the way. Do not use floor polish or wax that makes floors slippery. If you must use wax, use non-skid floor wax. Do not have throw rugs and other things on the floor that can make you trip. What can I do with my stairs? Do not leave any items on the stairs. Make sure that there are handrails on both sides of  the stairs and use them. Fix handrails that are broken or loose. Make sure that handrails are as long as the stairways. Check any carpeting to make sure that it is firmly attached to the stairs. Fix any carpet that is loose or worn. Avoid having throw rugs at the top or bottom of the stairs. If you do have throw rugs, attach them to the floor with carpet tape. Make sure that you have a light switch at the top of the stairs and the bottom of the stairs. If you do not have them, ask someone to add them for you. What else can I do to help prevent falls? Wear shoes that: Do not have high heels. Have rubber bottoms. Are comfortable and fit you well. Are closed at the toe. Do not wear sandals. If you use a  stepladder: Make sure that it is fully opened. Do not climb a closed stepladder. Make sure that both sides of the stepladder are locked into place. Ask someone to hold it for you, if possible. Clearly mark and make sure that you can see: Any grab bars or handrails. First and last steps. Where the edge of each step is. Use tools that help you move around (mobility aids) if they are needed. These include: Canes. Walkers. Scooters. Crutches. Turn on the lights when you go into a dark area. Replace any light bulbs as soon as they burn out. Set up your furniture so you have a clear path. Avoid moving your furniture around. If any of your floors are uneven, fix them. If there are any pets around you, be aware of where they are. Review your medicines with your doctor. Some medicines can make you feel dizzy. This can increase your chance of falling. Ask your doctor what other things that you can do to help prevent falls. This information is not intended to replace advice given to you by your health care provider. Make sure you discuss any questions you have with your health care provider. Document Released: 01/07/2009 Document Revised: 08/19/2015 Document Reviewed: 04/17/2014 Elsevier Interactive Patient Education  2017 [employer].

## 2021-05-19 ENCOUNTER — Telehealth: Payer: Self-pay | Admitting: Family

## 2021-05-19 DIAGNOSIS — C320 Malignant neoplasm of glottis: Secondary | ICD-10-CM | POA: Diagnosis not present

## 2021-05-19 DIAGNOSIS — M5431 Sciatica, right side: Secondary | ICD-10-CM | POA: Diagnosis not present

## 2021-05-19 DIAGNOSIS — I10 Essential (primary) hypertension: Secondary | ICD-10-CM | POA: Diagnosis not present

## 2021-05-19 DIAGNOSIS — M545 Low back pain, unspecified: Secondary | ICD-10-CM | POA: Diagnosis not present

## 2021-05-19 DIAGNOSIS — I4891 Unspecified atrial fibrillation: Secondary | ICD-10-CM | POA: Diagnosis not present

## 2021-05-19 DIAGNOSIS — M5432 Sciatica, left side: Secondary | ICD-10-CM | POA: Diagnosis not present

## 2021-05-19 NOTE — Telephone Encounter (Signed)
Patient aware can take otc meds tylenol or advil and to call us back if no better and we can make an appointment. Patient verbalized understanding

## 2021-05-26 DIAGNOSIS — M5431 Sciatica, right side: Secondary | ICD-10-CM | POA: Diagnosis not present

## 2021-05-26 DIAGNOSIS — I4891 Unspecified atrial fibrillation: Secondary | ICD-10-CM | POA: Diagnosis not present

## 2021-05-26 DIAGNOSIS — C320 Malignant neoplasm of glottis: Secondary | ICD-10-CM | POA: Diagnosis not present

## 2021-05-26 DIAGNOSIS — M545 Low back pain, unspecified: Secondary | ICD-10-CM | POA: Diagnosis not present

## 2021-05-26 DIAGNOSIS — I10 Essential (primary) hypertension: Secondary | ICD-10-CM | POA: Diagnosis not present

## 2021-05-26 DIAGNOSIS — M5432 Sciatica, left side: Secondary | ICD-10-CM | POA: Diagnosis not present

## 2021-06-01 DIAGNOSIS — I4891 Unspecified atrial fibrillation: Secondary | ICD-10-CM | POA: Diagnosis not present

## 2021-06-01 DIAGNOSIS — M5432 Sciatica, left side: Secondary | ICD-10-CM | POA: Diagnosis not present

## 2021-06-01 DIAGNOSIS — I10 Essential (primary) hypertension: Secondary | ICD-10-CM | POA: Diagnosis not present

## 2021-06-01 DIAGNOSIS — C320 Malignant neoplasm of glottis: Secondary | ICD-10-CM | POA: Diagnosis not present

## 2021-06-01 DIAGNOSIS — M545 Low back pain, unspecified: Secondary | ICD-10-CM | POA: Diagnosis not present

## 2021-06-01 DIAGNOSIS — M5431 Sciatica, right side: Secondary | ICD-10-CM | POA: Diagnosis not present

## 2021-06-02 ENCOUNTER — Other Ambulatory Visit: Payer: Self-pay

## 2021-06-02 ENCOUNTER — Telehealth: Payer: Self-pay | Admitting: Licensed Clinical Social Worker

## 2021-06-02 ENCOUNTER — Telehealth: Payer: Medicare Other

## 2021-06-02 ENCOUNTER — Ambulatory Visit (HOSPITAL_COMMUNITY)
Admission: RE | Admit: 2021-06-02 | Discharge: 2021-06-02 | Disposition: A | Discharge status: Home / Self Care | Payer: Medicare Other | Source: Ambulatory Visit | Attending: Radiation Oncology | Admitting: Radiation Oncology

## 2021-06-02 ENCOUNTER — Ambulatory Visit
Admission: RE | Admit: 2021-06-02 | Discharge: 2021-06-02 | Disposition: A | Discharge status: Home / Self Care | Payer: Medicare Other | Source: Ambulatory Visit | Attending: Radiation Oncology | Admitting: Radiation Oncology

## 2021-06-02 DIAGNOSIS — J384 Edema of larynx: Secondary | ICD-10-CM | POA: Diagnosis not present

## 2021-06-02 DIAGNOSIS — J439 Emphysema, unspecified: Secondary | ICD-10-CM | POA: Diagnosis not present

## 2021-06-02 DIAGNOSIS — Z510 Encounter for antineoplastic radiation therapy: Secondary | ICD-10-CM | POA: Insufficient documentation

## 2021-06-02 DIAGNOSIS — C3411 Malignant neoplasm of upper lobe, right bronchus or lung: Secondary | ICD-10-CM | POA: Diagnosis not present

## 2021-06-02 DIAGNOSIS — C760 Malignant neoplasm of head, face and neck: Secondary | ICD-10-CM

## 2021-06-02 DIAGNOSIS — C3492 Malignant neoplasm of unspecified part of left bronchus or lung: Secondary | ICD-10-CM | POA: Diagnosis not present

## 2021-06-02 DIAGNOSIS — C3412 Malignant neoplasm of upper lobe, left bronchus or lung: Secondary | ICD-10-CM

## 2021-06-02 DIAGNOSIS — Z87891 Personal history of nicotine dependence: Secondary | ICD-10-CM | POA: Insufficient documentation

## 2021-06-02 DIAGNOSIS — C801 Malignant (primary) neoplasm, unspecified: Secondary | ICD-10-CM | POA: Insufficient documentation

## 2021-06-02 DIAGNOSIS — I700 Atherosclerosis of aorta: Secondary | ICD-10-CM | POA: Diagnosis not present

## 2021-06-02 DIAGNOSIS — C320 Malignant neoplasm of glottis: Secondary | ICD-10-CM | POA: Insufficient documentation

## 2021-06-02 DIAGNOSIS — R911 Solitary pulmonary nodule: Secondary | ICD-10-CM | POA: Diagnosis not present

## 2021-06-02 DIAGNOSIS — Z923 Personal history of irradiation: Secondary | ICD-10-CM | POA: Insufficient documentation

## 2021-06-02 DIAGNOSIS — I6523 Occlusion and stenosis of bilateral carotid arteries: Secondary | ICD-10-CM | POA: Diagnosis not present

## 2021-06-02 DIAGNOSIS — Z79899 Other long term (current) drug therapy: Secondary | ICD-10-CM | POA: Insufficient documentation

## 2021-06-02 DIAGNOSIS — C7951 Secondary malignant neoplasm of bone: Secondary | ICD-10-CM | POA: Insufficient documentation

## 2021-06-02 DIAGNOSIS — R918 Other nonspecific abnormal finding of lung field: Secondary | ICD-10-CM | POA: Insufficient documentation

## 2021-06-02 DIAGNOSIS — C7931 Secondary malignant neoplasm of brain: Secondary | ICD-10-CM | POA: Insufficient documentation

## 2021-06-02 DIAGNOSIS — Z8521 Personal history of malignant neoplasm of larynx: Secondary | ICD-10-CM | POA: Insufficient documentation

## 2021-06-02 DIAGNOSIS — J392 Other diseases of pharynx: Secondary | ICD-10-CM | POA: Diagnosis not present

## 2021-06-02 LAB — BUN & CREATININE (CHCC)
BUN: 15 mg/dL (ref 8–23)
Creatinine: 1.02 mg/dL (ref 0.61–1.24)
GFR, Estimated: >60 mL/min (ref >60)

## 2021-06-02 MED ORDER — SODIUM CHLORIDE (PF) 0.9 % IJ SOLN
INTRAMUSCULAR | Status: AC
  Filled 2021-06-02: qty 50

## 2021-06-02 MED ORDER — IOHEXOL 300 MG/ML  SOLN
100.0000 mL | Freq: Once | INTRAMUSCULAR | Status: AC | PRN
  Administered 2021-06-02: 15:00:00 75 mL via INTRAVENOUS

## 2021-06-02 NOTE — Telephone Encounter (Signed)
?  Chronic Care Management  ?  ? Clinical Social Work Note ?06/02/21 ?  ?Name: NATALIE CORR  MRN: [id number]       DOB: 07/12/1945 ?  ?LENIX SLEETER is a 76 y.o. year old male who is a primary care patient of [name], FNP. The CCM team was consulted to assist the patient with chronic disease management and/or care coordination needs related to: [name] .  ?  ?LCSW not able to speak via phone with client today.  LCSW not able to leave phone message for client ? ?Follow Up Plan:  Call client on 07/27/21  at 2:00 PM ? ?Kelton Pillar.Kaisa Wofford MSW, LCSW ?Licensed Clinical Social Worker ?[facility] Care Management ?[phone] ?

## 2021-06-04 DIAGNOSIS — M545 Low back pain, unspecified: Secondary | ICD-10-CM | POA: Diagnosis not present

## 2021-06-04 DIAGNOSIS — I10 Essential (primary) hypertension: Secondary | ICD-10-CM | POA: Diagnosis not present

## 2021-06-04 DIAGNOSIS — N401 Enlarged prostate with lower urinary tract symptoms: Secondary | ICD-10-CM | POA: Diagnosis not present

## 2021-06-04 DIAGNOSIS — M5432 Sciatica, left side: Secondary | ICD-10-CM | POA: Diagnosis not present

## 2021-06-04 DIAGNOSIS — I4891 Unspecified atrial fibrillation: Secondary | ICD-10-CM | POA: Diagnosis not present

## 2021-06-04 DIAGNOSIS — Z9181 History of falling: Secondary | ICD-10-CM | POA: Diagnosis not present

## 2021-06-04 DIAGNOSIS — G8929 Other chronic pain: Secondary | ICD-10-CM | POA: Diagnosis not present

## 2021-06-04 DIAGNOSIS — C320 Malignant neoplasm of glottis: Secondary | ICD-10-CM | POA: Diagnosis not present

## 2021-06-04 DIAGNOSIS — M5431 Sciatica, right side: Secondary | ICD-10-CM | POA: Diagnosis not present

## 2021-06-04 DIAGNOSIS — Z923 Personal history of irradiation: Secondary | ICD-10-CM | POA: Diagnosis not present

## 2021-06-04 DIAGNOSIS — K219 Gastro-esophageal reflux disease without esophagitis: Secondary | ICD-10-CM | POA: Diagnosis not present

## 2021-06-04 DIAGNOSIS — E785 Hyperlipidemia, unspecified: Secondary | ICD-10-CM | POA: Diagnosis not present

## 2021-06-06 ENCOUNTER — Telehealth: Payer: Self-pay | Admitting: *Deleted

## 2021-06-06 NOTE — Telephone Encounter (Signed)
CALLED PATIENT TO ASK ABOUT COMING IN FOR A 1 PM FU ON 06-07-21, PATIENT DECLINED AND WANTS TO KEEP HIS FU ON 06-07-21 @ 2:20 PM, NOTIFIED KATELIN - NURSE FOR DR. SQUIRE ?

## 2021-06-07 ENCOUNTER — Other Ambulatory Visit: Payer: Self-pay | Admitting: Radiation Therapy

## 2021-06-07 ENCOUNTER — Ambulatory Visit
Admission: RE | Admit: 2021-06-07 | Discharge: 2021-06-07 | Disposition: A | Discharge status: Home / Self Care | Payer: Medicare Other | Source: Ambulatory Visit | Attending: Radiation Oncology | Admitting: Radiation Oncology

## 2021-06-07 ENCOUNTER — Other Ambulatory Visit: Payer: Self-pay

## 2021-06-07 VITALS — BP 150/77 | HR 84 | Temp 97.9°F | Resp 20 | Ht 69.0 in | Wt 165.2 lb

## 2021-06-07 DIAGNOSIS — C801 Malignant (primary) neoplasm, unspecified: Secondary | ICD-10-CM | POA: Diagnosis not present

## 2021-06-07 DIAGNOSIS — C7951 Secondary malignant neoplasm of bone: Secondary | ICD-10-CM | POA: Diagnosis not present

## 2021-06-07 DIAGNOSIS — C3412 Malignant neoplasm of upper lobe, left bronchus or lung: Secondary | ICD-10-CM | POA: Diagnosis present

## 2021-06-07 DIAGNOSIS — C3492 Malignant neoplasm of unspecified part of left bronchus or lung: Secondary | ICD-10-CM

## 2021-06-07 DIAGNOSIS — G939 Disorder of brain, unspecified: Secondary | ICD-10-CM | POA: Diagnosis not present

## 2021-06-07 DIAGNOSIS — C7931 Secondary malignant neoplasm of brain: Secondary | ICD-10-CM | POA: Diagnosis not present

## 2021-06-07 DIAGNOSIS — C760 Malignant neoplasm of head, face and neck: Secondary | ICD-10-CM | POA: Diagnosis not present

## 2021-06-07 DIAGNOSIS — C320 Malignant neoplasm of glottis: Secondary | ICD-10-CM | POA: Diagnosis not present

## 2021-06-07 DIAGNOSIS — Z87891 Personal history of nicotine dependence: Secondary | ICD-10-CM | POA: Diagnosis not present

## 2021-06-07 DIAGNOSIS — R918 Other nonspecific abnormal finding of lung field: Secondary | ICD-10-CM | POA: Diagnosis not present

## 2021-06-07 DIAGNOSIS — C3411 Malignant neoplasm of upper lobe, right bronchus or lung: Secondary | ICD-10-CM | POA: Insufficient documentation

## 2021-06-07 DIAGNOSIS — M5459 Other low back pain: Secondary | ICD-10-CM | POA: Diagnosis not present

## 2021-06-07 DIAGNOSIS — I2510 Atherosclerotic heart disease of native coronary artery without angina pectoris: Secondary | ICD-10-CM | POA: Insufficient documentation

## 2021-06-07 DIAGNOSIS — I700 Atherosclerosis of aorta: Secondary | ICD-10-CM | POA: Insufficient documentation

## 2021-06-07 DIAGNOSIS — R609 Edema, unspecified: Secondary | ICD-10-CM | POA: Insufficient documentation

## 2021-06-07 DIAGNOSIS — Z79899 Other long term (current) drug therapy: Secondary | ICD-10-CM | POA: Diagnosis not present

## 2021-06-07 DIAGNOSIS — Z510 Encounter for antineoplastic radiation therapy: Secondary | ICD-10-CM | POA: Diagnosis present

## 2021-06-07 DIAGNOSIS — Z8521 Personal history of malignant neoplasm of larynx: Secondary | ICD-10-CM | POA: Diagnosis not present

## 2021-06-07 DIAGNOSIS — Z20822 Contact with and (suspected) exposure to covid-19: Secondary | ICD-10-CM | POA: Diagnosis not present

## 2021-06-07 DIAGNOSIS — Z923 Personal history of irradiation: Secondary | ICD-10-CM | POA: Diagnosis not present

## 2021-06-07 DIAGNOSIS — J439 Emphysema, unspecified: Secondary | ICD-10-CM | POA: Insufficient documentation

## 2021-06-07 NOTE — Progress Notes (Signed)
Mr. Amison returns for follow-up since completing radiation to his larynx and his lungs, and to review CT scan results from 06/02/2021 ? ?Had modified cervical neck dissection by Dr. Fenton Malling on 04/05/2021 ?Final Pathologic Diagnosis   ?A. RIGHT NECK CONTENTS, REGIONAL LYMPH NODE DISSECTION:  ?             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 ?             Lymphovascular invasion is present. ?             Perineural invasion is present. ?Dense fibrosis and chronic inflammation. ?Twelve lymph nodes, negative for malignancy (0/12). ? ?Denies any ear or jaw pain, or difficulty opening his mouth. Reports a stable appetite, but does occasionally has trouble swallowing solid foods. Denies any worsening shortness of breath. Reports an occasional productive cough. Denies any signs/symptoms of lymphedema since his surgery, but have lingering numbness down the right side of his neck. Denies any new dental concerns or mouth sores, and reports that he sees his community dentist regularly ?

## 2021-06-08 ENCOUNTER — Other Ambulatory Visit: Payer: Self-pay

## 2021-06-08 ENCOUNTER — Telehealth: Payer: Self-pay | Admitting: *Deleted

## 2021-06-08 ENCOUNTER — Telehealth: Payer: Self-pay | Admitting: Hematology and Oncology

## 2021-06-08 ENCOUNTER — Encounter: Payer: Self-pay | Admitting: Radiation Oncology

## 2021-06-08 DIAGNOSIS — Z8521 Personal history of malignant neoplasm of larynx: Secondary | ICD-10-CM | POA: Diagnosis not present

## 2021-06-08 DIAGNOSIS — Z510 Encounter for antineoplastic radiation therapy: Secondary | ICD-10-CM | POA: Diagnosis not present

## 2021-06-08 DIAGNOSIS — C7951 Secondary malignant neoplasm of bone: Secondary | ICD-10-CM | POA: Diagnosis not present

## 2021-06-08 DIAGNOSIS — Z79899 Other long term (current) drug therapy: Secondary | ICD-10-CM | POA: Diagnosis not present

## 2021-06-08 DIAGNOSIS — R918 Other nonspecific abnormal finding of lung field: Secondary | ICD-10-CM | POA: Diagnosis not present

## 2021-06-08 DIAGNOSIS — C320 Malignant neoplasm of glottis: Secondary | ICD-10-CM

## 2021-06-08 DIAGNOSIS — C760 Malignant neoplasm of head, face and neck: Secondary | ICD-10-CM | POA: Diagnosis not present

## 2021-06-08 DIAGNOSIS — C7931 Secondary malignant neoplasm of brain: Secondary | ICD-10-CM | POA: Diagnosis not present

## 2021-06-08 DIAGNOSIS — C801 Malignant (primary) neoplasm, unspecified: Secondary | ICD-10-CM | POA: Diagnosis not present

## 2021-06-08 DIAGNOSIS — Z923 Personal history of irradiation: Secondary | ICD-10-CM | POA: Diagnosis not present

## 2021-06-08 DIAGNOSIS — C3412 Malignant neoplasm of upper lobe, left bronchus or lung: Secondary | ICD-10-CM | POA: Diagnosis not present

## 2021-06-08 DIAGNOSIS — Z87891 Personal history of nicotine dependence: Secondary | ICD-10-CM | POA: Diagnosis not present

## 2021-06-08 NOTE — Telephone Encounter (Signed)
Scheduled appt per 3/15 referral. Pt is aware of appt date and time. Pt is aware to arrive 15 mins prior to appt time and to bring and updated insurance card. Pt is aware of appt location.   ?

## 2021-06-08 NOTE — Telephone Encounter (Signed)
Called patient to inform of Pet Scan for 06-16-21- arrival time- 1:30 pm @ [facility], patient to have water only- 6 hrs. prior to test, lvm for a return call ?

## 2021-06-08 NOTE — Progress Notes (Signed)
?Radiation Oncology         ([phone]) [phone] ?________________________________ ? ?Name: Logan Price MRN: [id number]  ?Date: 06/07/2021  DOB: 1945-04-07 ? ?Follow-Up Visit Note in person ? ?CC: Hawks, Logan Bo, FNP  Logan Spencer, FNP ? ?Diagnosis and Prior Radiotherapy:     ?  ICD-10-CM   ?1. Squamous cell carcinoma of glottis (HCC)  C32.0 NM PET Image Restag (PS) Skull Base To Thigh  ?  ?2. Malignant neoplasm of right upper lobe of lung (HCC)  C34.11 NM PET Image Restag (PS) Skull Base To Thigh  ?  ?3. Primary cancer of left upper lobe of lung (HCC)  C34.12 NM PET Image Restag (PS) Skull Base To Thigh  ?  ?4. 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HCC)  C34.92   ?  ?5. Adenocarcinoma, lung, left (HCC)  C34.92   ?  ?6. Bone metastasis (HCC)  C79.51   ?  ?  ? ? 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 ? ?Radiation Treatment Dates: 10/22/2018 through 12/10/2018 ?Site Technique Total Dose Dose per Fx Completed Fx Beam Energies  ?Head & neck: HN_larynx IMRT 70/70 2 35/35 6X  ? ?Radiation Treatment Dates: 06/03/2019 through 06/13/2019 ?Site Technique Total Dose (Gy) Dose per Fx (Gy) Completed Fx Beam Energies  ?Lung, Right: Lung_Rt_Upper IMRT 60/60 12 5/5 6XFFF  ?Lung, Left: Lung_Lt_Upper IMRT 60/60 12 5/5 6XFFF  ? ?Radiation Treatment Dates: 01/18/2021 through 01/28/2021 ?Site Technique Total Dose (Gy) Dose per Fx (Gy) Completed Fx Beam Energies  ?Lung, Right: Lung_Rt_Upper IMRT 60/60 12 5/5 6XFFF  ? ? ?CHIEF COMPLAINT:  Here for follow-up and surveillance of throat and lung cancer ? ? ?Narrative:    Logan Price returns for follow-up since completing radiation to his larynx/neck and his lungs, and to review CT scan results from 06/02/2021 ? 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 He denies any new neurologic symptoms or upper back pain. ? ?Had modified cervical neck dissection by Dr. Jacalyn Price on 04/05/2021 ?Final Pathologic Diagnosis   ?A. RIGHT NECK CONTENTS, REGIONAL LYMPH NODE DISSECTION:  ?             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 ?             Lymphovascular invasion is present. ?             Perineural invasion is present. ?Dense fibrosis and chronic inflammation. ?Twelve lymph nodes, negative for malignancy (0/12). ? ?Denies any ear or jaw pain, or difficulty opening his mouth. Reports a stable appetite, but does occasionally has trouble swallowing solid foods. Denies any worsening shortness of breath. Reports an occasional productive cough. Denies any signs/symptoms of lymphedema since his surgery, but have lingering numbness down the right side of his neck. Denies any new dental concerns or mouth sores, and reports that he sees his community dentist regularly. Ambulatory with cane.  No dizziness or vertigo or HA or focal deficits. ? ?ALLERGIES:  has No Known Allergies. ? ?Meds: ?Current Outpatient Medications  ?Medication Sig Dispense Refill  ? benzonatate (TESSALON PERLES) 100 MG capsule Take 1 capsule (100 mg total) by mouth 3 (three) times daily as needed. 20 capsule 0  ?  doxazosin (CARDURA) 1 MG tablet Place 1 tablet (1 mg total) into feeding tube daily. (Patient taking differently: Take 1 mg by mouth daily.) 30 tablet 5  ? ondansetron (ZOFRAN) 4 MG tablet Take 1 tablet (4 mg total) by mouth every 8 (eight) hours as needed for nausea or vomiting. 20 tablet 0  ? pantoprazole (PROTONIX) 40 MG tablet TAKE ONE TABLET DAILY 30 MINUTES PRIOR TO THE FIRST MEAL 90 tablet 1  ? simvastatin (ZOCOR) 20 MG tablet TAKE 1 TABLET DAILY AT 6PM 90 tablet 0  ? ?No current facility-administered medications for this encounter.  ? ? ?Physical Findings: ?The patient is in no acute distress.  Patient is alert and oriented. ?Wt Readings from Last 3 Encounters:  ?06/07/21 165 lb 3.2 oz (74.9 kg)  ?05/17/21 167 lb (75.8 kg)  ?04/11/21 167 lb (75.8 kg)  ? ? height is 5\' 9"  (1.753 m) and weight is 165 lb 3.2 oz (74.9 kg). His temperature is 97.9 ?F (36.6 ?C). His blood pressure is 150/77 (abnormal) and his pulse is 84. His respiration is 20 and oxygen saturation is 98%. Marland Kitchen  ?General: Alert and oriented, in no acute distress.   ?NECK: No obvious palpable masses in the cervical or supraclavicular neck; healed from right neck dissection. ?HEENT: no lesions ?Heart regular in rate and rhythm ?Chest clear to auscultation bilaterally ?Extremities without any edema ?Psychiatric: Affect appropriate, judgment and insight intact ?Neuro: non focal, no nystagmus, coordination is intact ?MSK: strength is grossly intact, nontender to palpation at T5 ? ?Lab Findings: ?Lab Results  ?Component Value Date  ? WBC 4.4 10/07/2020  ? HGB 12.9 (L) 10/07/2020  ? HCT 39.5 10/07/2020  ? MCV 91 10/07/2020  ? PLT 185 10/07/2020  ? ? ?Lab Results  ?Component Value Date  ? TSH 3.886 03/04/2021  ? ? ?Radiographic Findings:   ?CT Soft Tissue Neck W Contrast ? ?Result Date: 06/02/2021 ?CLINICAL DATA:  Squamous cell carcinoma of the glottis EXAM: CT NECK WITH CONTRAST TECHNIQUE: Multidetector CT imaging of the neck was performed using the standard protocol following the bolus administration of intravenous contrast. RADIATION DOSE REDUCTION: This exam was performed according to the departmental dose-optimization program which includes automated exposure control, adjustment of the mA and/or kV according to patient size and/or use of iterative reconstruction technique. CONTRAST:  75mL OMNIPAQUE IOHEXOL 300 MG/ML  SOLN COMPARISON:  09/18/2019 FINDINGS: Pharynx and larynx: No mass or swelling. Postradiation changes. Mild edema is again noted in the larynx, which has decreased compared to prior exam. No abnormal enhancement. Salivary glands:  Postradiation changes in the parotid and submandibular glands bilaterally. Otherwise unremarkable. Thyroid: Normal. Lymph nodes: No abnormal or enlarged lymph nodes. Vascular: Patent. Atherosclerotic calcifications in bilateral carotid arteries. Limited intracranial: Enhancing lesion in the right cerebellum, which measures up to 2 cm (series 3, image 3) which was not present on the prior exam. Visualized orbits: Negative. Mastoids and visualized paranasal sinuses: Clear. Skeleton: Degenerative changes in the cervical spine. No acute osseous abnormality or suspicious osseous lesion. Upper chest: Apical scarring. Emphysema. No focal pulmonary opacity or pleural effusion. Other: None. IMPRESSION: 1. Postradiation changes in the larynx, without evidence of mass or abnormal enhancement. 2. Enhancing lesion in the right cerebellum, which appears new from the prior exam and is concerning for an additional focus of metastatic disease. An MRI of the head with and without contrast is recommended. These results will be called to the ordering clinician or representative by the Radiologist Assistant, and communication documented in the  PACS or [employer]. Electronically Signed   By: Wiliam Ke M.D.   On: 06/02/2021 15:54  ? ?CT Chest W Contrast ? ?Result Date: 06/03/2021 ?CLINICAL DATA:  76 year old male with history of non-small cell lung cancer (adenocarcinoma of the left lung) and head neck cancer (squamous cell carcinoma of the glottis). Follow-up study. * onc * EXAM: CT CHEST WITH CONTRAST TECHNIQUE: Multidetector CT imaging of the chest was performed during intravenous contrast administration. RADIATION DOSE REDUCTION: This exam was performed according to the departmental dose-optimization program which includes automated exposure control, adjustment of the mA and/or kV according to patient size and/or use of iterative reconstruction technique. CONTRAST:  75mL OMNIPAQUE IOHEXOL 300 MG/ML  SOLN COMPARISON:  Chest CT  11/22/2020.  PET-CT 01/04/2021. FINDINGS: Cardiovascular: Heart size is normal. There is no significant pericardial fluid, thickening or pericardial calcification. There is aortic atherosclerosis, as well as atherosc

## 2021-06-13 ENCOUNTER — Ambulatory Visit
Admission: RE | Admit: 2021-06-13 | Discharge: 2021-06-13 | Disposition: A | Discharge status: Home / Self Care | Payer: Medicare Other | Source: Ambulatory Visit | Attending: Radiation Oncology | Admitting: Radiation Oncology

## 2021-06-13 ENCOUNTER — Other Ambulatory Visit: Payer: Self-pay

## 2021-06-13 DIAGNOSIS — C7931 Secondary malignant neoplasm of brain: Secondary | ICD-10-CM | POA: Diagnosis not present

## 2021-06-13 DIAGNOSIS — Z87891 Personal history of nicotine dependence: Secondary | ICD-10-CM | POA: Diagnosis not present

## 2021-06-13 DIAGNOSIS — C7951 Secondary malignant neoplasm of bone: Secondary | ICD-10-CM | POA: Diagnosis not present

## 2021-06-13 DIAGNOSIS — Z510 Encounter for antineoplastic radiation therapy: Secondary | ICD-10-CM | POA: Diagnosis not present

## 2021-06-13 DIAGNOSIS — C760 Malignant neoplasm of head, face and neck: Secondary | ICD-10-CM | POA: Diagnosis not present

## 2021-06-13 DIAGNOSIS — C3412 Malignant neoplasm of upper lobe, left bronchus or lung: Secondary | ICD-10-CM | POA: Diagnosis not present

## 2021-06-13 DIAGNOSIS — C320 Malignant neoplasm of glottis: Secondary | ICD-10-CM | POA: Diagnosis not present

## 2021-06-14 ENCOUNTER — Other Ambulatory Visit: Payer: Self-pay

## 2021-06-14 ENCOUNTER — Ambulatory Visit
Admission: RE | Admit: 2021-06-14 | Discharge: 2021-06-14 | Disposition: A | Discharge status: Home / Self Care | Payer: Medicare Other | Source: Ambulatory Visit | Attending: Radiation Oncology | Admitting: Radiation Oncology

## 2021-06-14 DIAGNOSIS — C7931 Secondary malignant neoplasm of brain: Secondary | ICD-10-CM | POA: Diagnosis not present

## 2021-06-14 DIAGNOSIS — C320 Malignant neoplasm of glottis: Secondary | ICD-10-CM | POA: Diagnosis not present

## 2021-06-14 DIAGNOSIS — C760 Malignant neoplasm of head, face and neck: Secondary | ICD-10-CM | POA: Diagnosis not present

## 2021-06-14 DIAGNOSIS — Z510 Encounter for antineoplastic radiation therapy: Secondary | ICD-10-CM | POA: Diagnosis not present

## 2021-06-14 DIAGNOSIS — C3412 Malignant neoplasm of upper lobe, left bronchus or lung: Secondary | ICD-10-CM | POA: Diagnosis not present

## 2021-06-14 DIAGNOSIS — Z87891 Personal history of nicotine dependence: Secondary | ICD-10-CM | POA: Diagnosis not present

## 2021-06-14 DIAGNOSIS — C7951 Secondary malignant neoplasm of bone: Secondary | ICD-10-CM | POA: Diagnosis not present

## 2021-06-14 IMAGING — CT CT CHEST WITH CONTRAST
2 of 4 series · 15 of 36 positions shown, 18 images · IV contrast (omnipaque)
Comparison: 09/28/2018 neck CT.

CLINICAL DATA: Neck mass, advanced neck cancer, status post
tracheostomy.

EXAM:
CT CHEST WITH CONTRAST
TECHNIQUE: Multidetector CT imaging of the chest was performed during
intravenous contrast administration.
CONTRAST:  75mL OMNIPAQUE IOHEXOL 300 MG/ML  SOLN

[Series 3: thorax 2.0 i31f 2 · axial · 0.82mm/px · z∈[+1081,+1353]mm · 12 of 160 slices shown, 15 images]
[im 12/160  mediastinal]
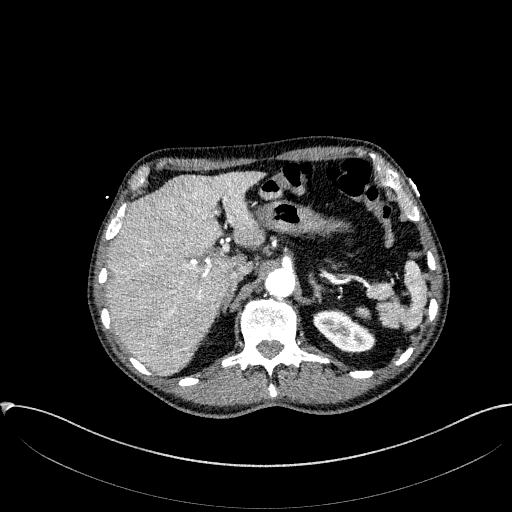
[im 12/160  lung]
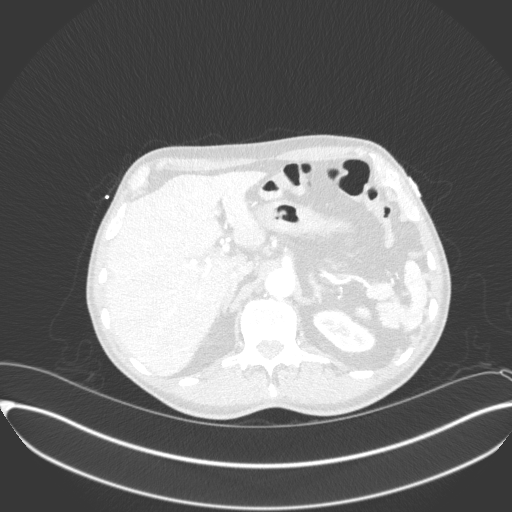
[im 23/160  lung]
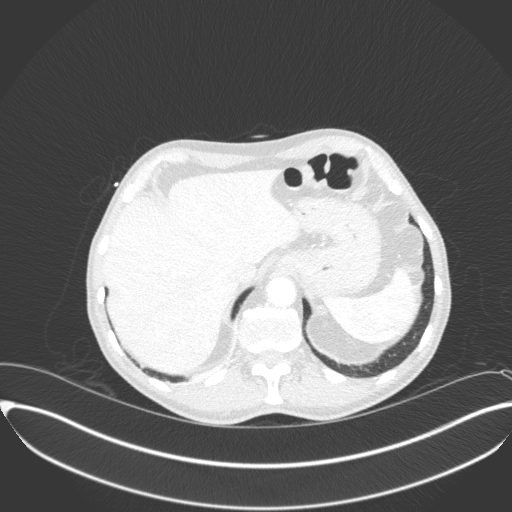
[im 35/160  lung]
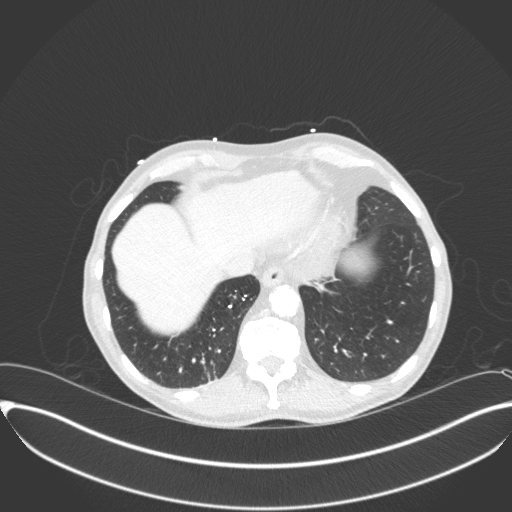
[im 46/160  lung]
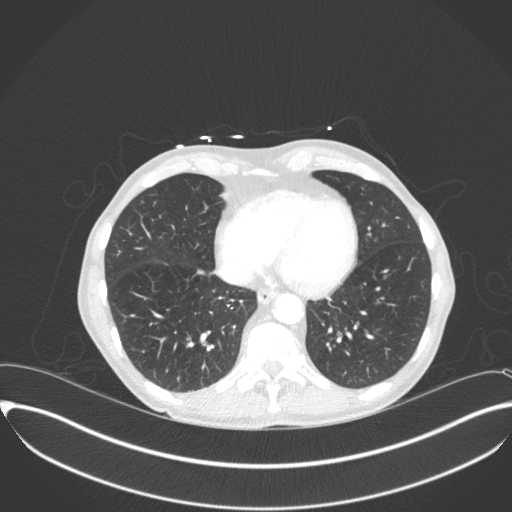
[im 57/160  mediastinal]
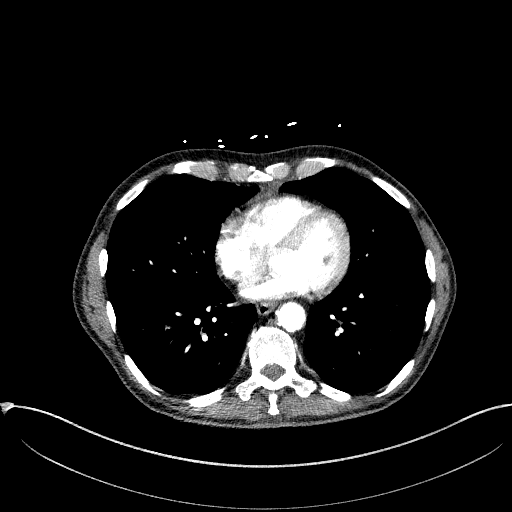
[im 57/160  lung]
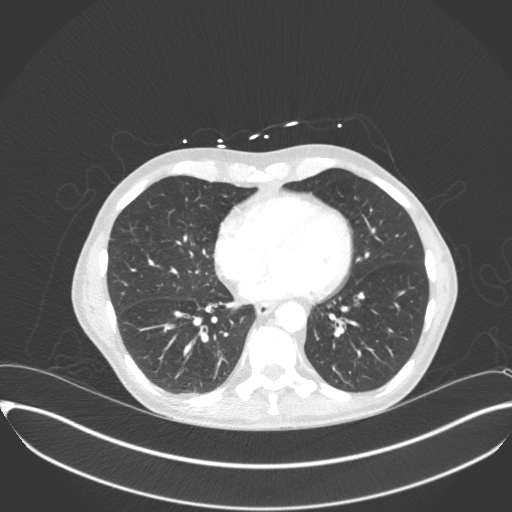
[im 69/160  lung]
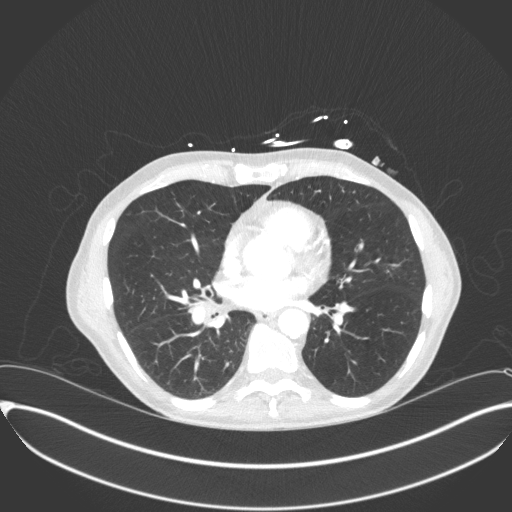
[im 91/160  lung]
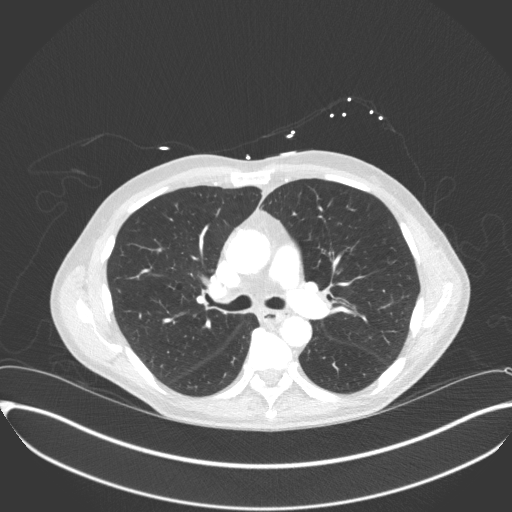
[im 103/160  lung]
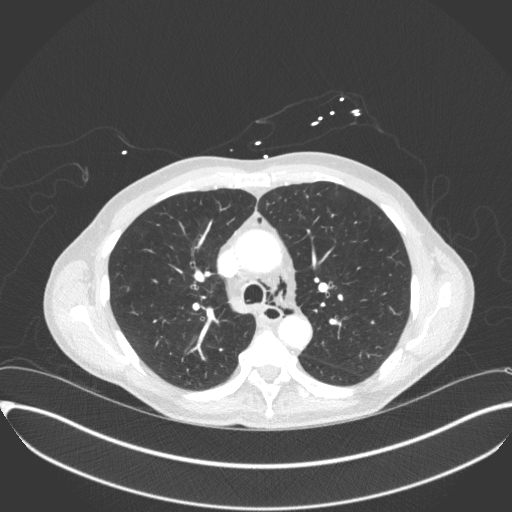
[im 114/160  mediastinal]
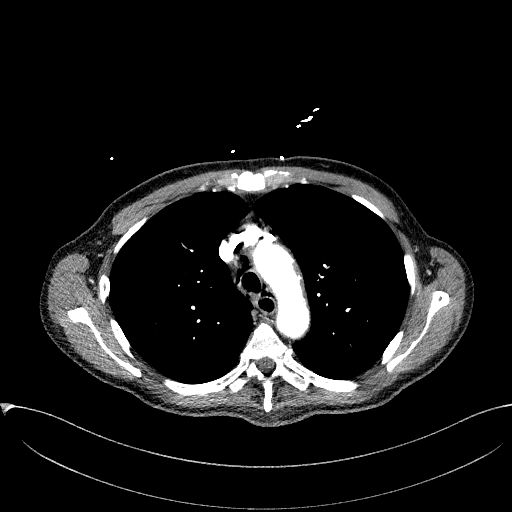
[im 114/160  lung]
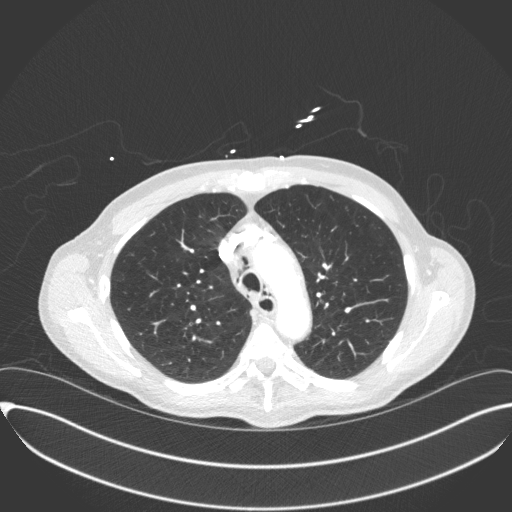
[im 125/160  lung]
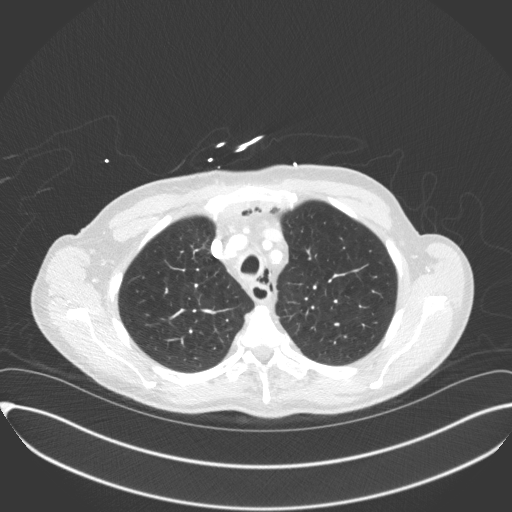
[im 137/160  lung]
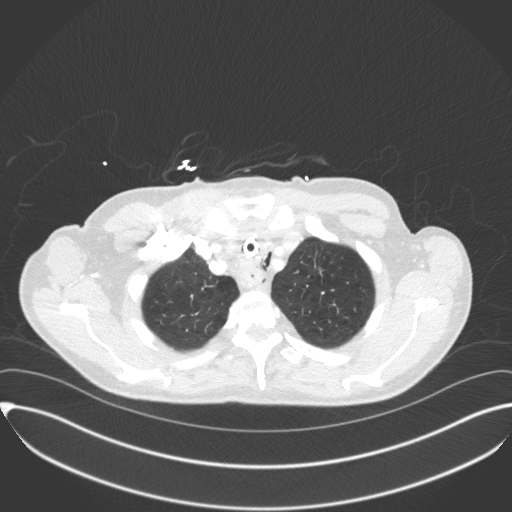
[im 148/160  lung]
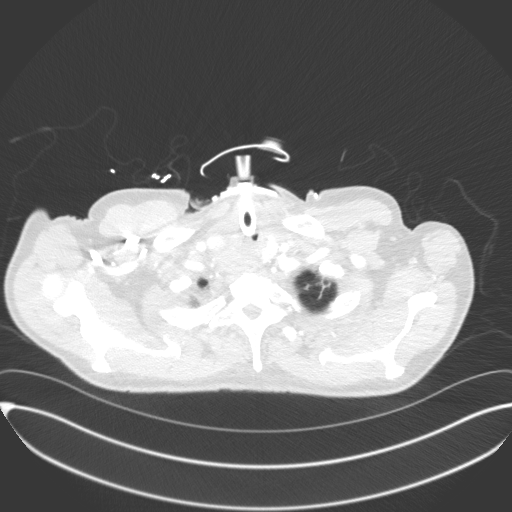

[Series 6: coronal · coronal · 0.62mm/px · 3 of 150 slices shown]
[im 30/150  lung]
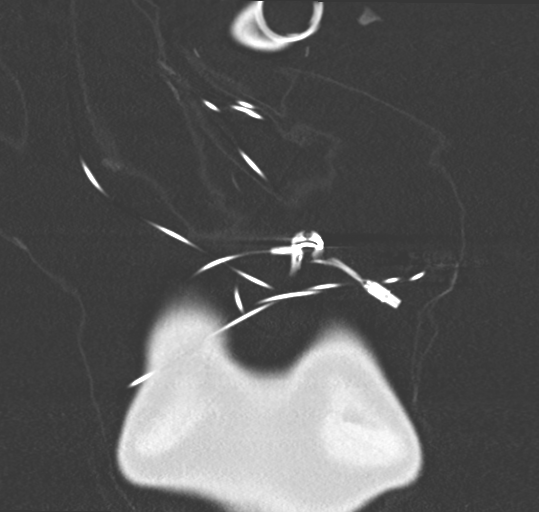
[im 60/150  lung]
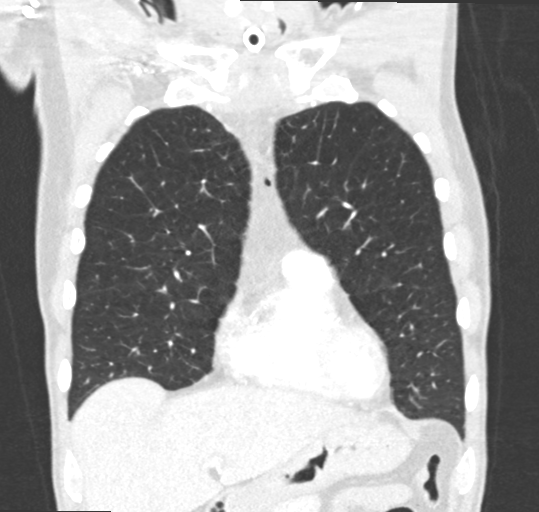
[im 90/150  lung]
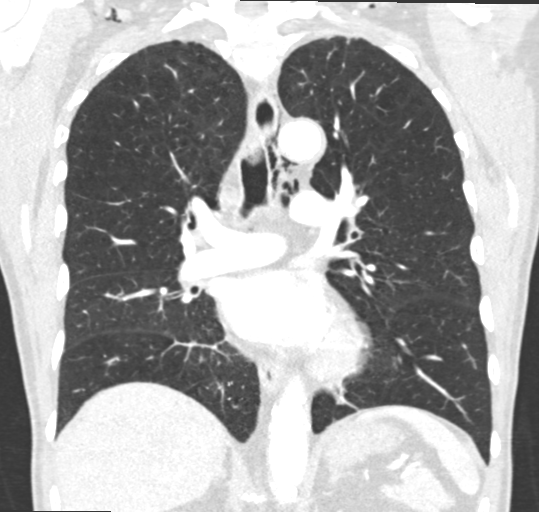

[15 of 36 positions shown; findings below may reference images not displayed]

FINDINGS: Cardiovascular: Normal heart size. No significant pericardial
effusion/thickening. Three-vessel coronary atherosclerosis.
Atherosclerotic nonaneurysmal thoracic aorta. Normal caliber
pulmonary arteries. No central pulmonary emboli.

Mediastinum/Nodes: No discrete thyroid nodules. Normal thoracic
esophagus. Infiltrative right neck mass involving larynx and
cervical esophagus, as detailed on neck CT from 1 day prior.
Subcutaneous emphysema scattered in the lower neck bilaterally.
Scattered pneumomediastinum throughout the bilateral paratracheal
and paraesophageal regions. No pathologically enlarged axillary,
mediastinal or hilar lymph nodes.

Lungs/Pleura: No pneumothorax. No pleural effusion. Moderate
centrilobular emphysema with diffuse bronchial wall thickening.
Tracheostomy tube tip is in the tracheal lumen just below thoracic
inlet. No acute consolidative airspace disease or lung masses.
Scattered calcified granulomas throughout the medial right lower
lobe. A few scattered solid pulmonary nodules in the upper lobes
bilaterally, predominantly at the lung apices, largest 6 mm at the
apical left upper lobe (series 5/image 17) and 5 mm in the apical
right upper lobe (series 5/image 18), all stable since 02/22/2018
CT.

Upper abdomen: No acute abnormality.

Musculoskeletal: No aggressive appearing focal osseous lesions. Mild
symmetric bilateral gynecomastia. Moderate thoracic spondylosis.
IMPRESSION: 1. No thoracic adenopathy or other findings suspicious for
metastatic disease in the chest.
2. Bilateral upper lobe solid pulmonary nodules, largest 6 mm, all
stable since 02/22/2018 neck CT, more likely benign. Suggest
follow-up chest CT in 12 months to demonstrate continued stability.
3. Infiltrative right neck mass involving the larynx and cervical
esophagus, as detailed on neck CT from 1 day prior.
4. Well-positioned tracheostomy tube.
5. Three-vessel coronary atherosclerosis.

Aortic Atherosclerosis ([id number]-UO6.6) and Emphysema ([id number].T).

## 2021-06-15 ENCOUNTER — Ambulatory Visit
Admission: RE | Admit: 2021-06-15 | Discharge: 2021-06-15 | Disposition: A | Discharge status: Home / Self Care | Payer: Medicare Other | Source: Ambulatory Visit | Attending: Radiation Oncology | Admitting: Radiation Oncology

## 2021-06-15 DIAGNOSIS — C320 Malignant neoplasm of glottis: Secondary | ICD-10-CM | POA: Diagnosis not present

## 2021-06-15 DIAGNOSIS — C7931 Secondary malignant neoplasm of brain: Secondary | ICD-10-CM | POA: Diagnosis not present

## 2021-06-15 DIAGNOSIS — C760 Malignant neoplasm of head, face and neck: Secondary | ICD-10-CM | POA: Diagnosis not present

## 2021-06-15 DIAGNOSIS — Z510 Encounter for antineoplastic radiation therapy: Secondary | ICD-10-CM | POA: Diagnosis not present

## 2021-06-15 DIAGNOSIS — C3412 Malignant neoplasm of upper lobe, left bronchus or lung: Secondary | ICD-10-CM | POA: Diagnosis not present

## 2021-06-15 DIAGNOSIS — C7951 Secondary malignant neoplasm of bone: Secondary | ICD-10-CM | POA: Diagnosis not present

## 2021-06-15 DIAGNOSIS — Z87891 Personal history of nicotine dependence: Secondary | ICD-10-CM | POA: Diagnosis not present

## 2021-06-15 DIAGNOSIS — C3490 Malignant neoplasm of unspecified part of unspecified bronchus or lung: Secondary | ICD-10-CM | POA: Diagnosis not present

## 2021-06-15 DIAGNOSIS — G319 Degenerative disease of nervous system, unspecified: Secondary | ICD-10-CM | POA: Diagnosis not present

## 2021-06-15 DIAGNOSIS — G936 Cerebral edema: Secondary | ICD-10-CM | POA: Diagnosis not present

## 2021-06-15 MED ORDER — GADOBENATE DIMEGLUMINE 529 MG/ML IV SOLN
15.0000 mL | Freq: Once | INTRAVENOUS | Status: AC | PRN
  Administered 2021-06-15: 15 mL via INTRAVENOUS

## 2021-06-16 ENCOUNTER — Encounter (HOSPITAL_COMMUNITY)
Admission: RE | Admit: 2021-06-16 | Discharge: 2021-06-16 | Disposition: A | Discharge status: Home / Self Care | Payer: Medicare Other | Source: Ambulatory Visit | Attending: Radiation Oncology | Admitting: Radiation Oncology

## 2021-06-16 ENCOUNTER — Encounter (HOSPITAL_COMMUNITY): Payer: Self-pay

## 2021-06-16 ENCOUNTER — Ambulatory Visit
Admission: RE | Admit: 2021-06-16 | Discharge: 2021-06-16 | Disposition: A | Discharge status: Home / Self Care | Payer: Medicare Other | Source: Ambulatory Visit | Attending: Radiation Oncology | Admitting: Radiation Oncology

## 2021-06-16 ENCOUNTER — Other Ambulatory Visit: Payer: Self-pay

## 2021-06-16 DIAGNOSIS — C760 Malignant neoplasm of head, face and neck: Secondary | ICD-10-CM | POA: Diagnosis not present

## 2021-06-16 DIAGNOSIS — C320 Malignant neoplasm of glottis: Secondary | ICD-10-CM | POA: Insufficient documentation

## 2021-06-16 DIAGNOSIS — Z87891 Personal history of nicotine dependence: Secondary | ICD-10-CM | POA: Diagnosis not present

## 2021-06-16 DIAGNOSIS — Z510 Encounter for antineoplastic radiation therapy: Secondary | ICD-10-CM | POA: Diagnosis not present

## 2021-06-16 DIAGNOSIS — C3412 Malignant neoplasm of upper lobe, left bronchus or lung: Secondary | ICD-10-CM | POA: Insufficient documentation

## 2021-06-16 DIAGNOSIS — C3411 Malignant neoplasm of upper lobe, right bronchus or lung: Secondary | ICD-10-CM | POA: Insufficient documentation

## 2021-06-16 DIAGNOSIS — C7951 Secondary malignant neoplasm of bone: Secondary | ICD-10-CM | POA: Diagnosis not present

## 2021-06-16 DIAGNOSIS — C7931 Secondary malignant neoplasm of brain: Secondary | ICD-10-CM | POA: Diagnosis not present

## 2021-06-17 ENCOUNTER — Other Ambulatory Visit: Payer: Self-pay | Admitting: Family

## 2021-06-17 ENCOUNTER — Encounter: Payer: Self-pay | Admitting: Radiation Oncology

## 2021-06-17 ENCOUNTER — Ambulatory Visit
Admission: RE | Admit: 2021-06-17 | Discharge: 2021-06-17 | Disposition: A | Discharge status: Home / Self Care | Payer: Medicare Other | Source: Ambulatory Visit | Attending: Radiation Oncology | Admitting: Radiation Oncology

## 2021-06-17 DIAGNOSIS — C3412 Malignant neoplasm of upper lobe, left bronchus or lung: Secondary | ICD-10-CM | POA: Diagnosis not present

## 2021-06-17 DIAGNOSIS — Z510 Encounter for antineoplastic radiation therapy: Secondary | ICD-10-CM | POA: Diagnosis not present

## 2021-06-17 DIAGNOSIS — C7931 Secondary malignant neoplasm of brain: Secondary | ICD-10-CM | POA: Diagnosis not present

## 2021-06-17 DIAGNOSIS — C320 Malignant neoplasm of glottis: Secondary | ICD-10-CM | POA: Diagnosis not present

## 2021-06-17 DIAGNOSIS — C7951 Secondary malignant neoplasm of bone: Secondary | ICD-10-CM | POA: Diagnosis not present

## 2021-06-17 DIAGNOSIS — E785 Hyperlipidemia, unspecified: Secondary | ICD-10-CM

## 2021-06-17 DIAGNOSIS — C760 Malignant neoplasm of head, face and neck: Secondary | ICD-10-CM | POA: Diagnosis not present

## 2021-06-17 DIAGNOSIS — Z87891 Personal history of nicotine dependence: Secondary | ICD-10-CM | POA: Diagnosis not present

## 2021-06-20 ENCOUNTER — Ambulatory Visit
Admission: RE | Admit: 2021-06-20 | Discharge: 2021-06-20 | Disposition: A | Discharge status: Home / Self Care | Payer: Medicare Other | Source: Ambulatory Visit | Attending: Radiation Oncology | Admitting: Radiation Oncology

## 2021-06-20 ENCOUNTER — Inpatient Hospital Stay: Payer: Medicare Other

## 2021-06-20 DIAGNOSIS — C760 Malignant neoplasm of head, face and neck: Secondary | ICD-10-CM | POA: Diagnosis not present

## 2021-06-20 DIAGNOSIS — Z79899 Other long term (current) drug therapy: Secondary | ICD-10-CM | POA: Insufficient documentation

## 2021-06-20 DIAGNOSIS — C801 Malignant (primary) neoplasm, unspecified: Secondary | ICD-10-CM | POA: Insufficient documentation

## 2021-06-20 DIAGNOSIS — Z8521 Personal history of malignant neoplasm of larynx: Secondary | ICD-10-CM | POA: Insufficient documentation

## 2021-06-20 DIAGNOSIS — C7931 Secondary malignant neoplasm of brain: Secondary | ICD-10-CM | POA: Insufficient documentation

## 2021-06-20 DIAGNOSIS — Z923 Personal history of irradiation: Secondary | ICD-10-CM | POA: Insufficient documentation

## 2021-06-20 DIAGNOSIS — R918 Other nonspecific abnormal finding of lung field: Secondary | ICD-10-CM | POA: Insufficient documentation

## 2021-06-20 DIAGNOSIS — C7951 Secondary malignant neoplasm of bone: Secondary | ICD-10-CM

## 2021-06-20 DIAGNOSIS — Z87891 Personal history of nicotine dependence: Secondary | ICD-10-CM | POA: Insufficient documentation

## 2021-06-20 DIAGNOSIS — C3412 Malignant neoplasm of upper lobe, left bronchus or lung: Secondary | ICD-10-CM | POA: Diagnosis not present

## 2021-06-20 DIAGNOSIS — C320 Malignant neoplasm of glottis: Secondary | ICD-10-CM | POA: Diagnosis not present

## 2021-06-20 DIAGNOSIS — Z510 Encounter for antineoplastic radiation therapy: Secondary | ICD-10-CM | POA: Diagnosis not present

## 2021-06-20 MED ORDER — SODIUM CHLORIDE 0.9% FLUSH
10.0000 mL | Freq: Once | INTRAVENOUS | Status: AC
  Administered 2021-06-20: 10 mL via INTRAVENOUS

## 2021-06-20 NOTE — Progress Notes (Signed)
Has armband been applied?  Yes.   ? ?Does patient have an allergy to IV contrast dye?: No. ?  ?Has patient ever received premedication for IV contrast dye?: No.  ? ?Does patient take metformin?: No. ? ?Date of lab work: June 02, 2021 ?BUN: 15 ?CR: 1.10 ?eGFR: >60 ? ?IV site: antecubital right, condition patent and no redness ? ?Has IV site been added to flowsheet?  Yes.   ? ? ?

## 2021-06-20 NOTE — Progress Notes (Signed)
?Radiation Oncology         [phone] ?________________________________ ? ?Outpatient Re-Consultation ? ?Name: [name] MRN: [id number]  ?Date: 06/17/2021  DOB: Aug 10, 1945 ? ?ON:GEXBM, Christy A, FNP  No ref. provider found  ? ?REFERRING PHYSICIAN: No ref. provider found ? ?DIAGNOSIS:   ?  ICD-10-CM   ?1. Brain metastases (HCC)  C79.31   ?  ?  ?STAGE IV ? 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 ?HISTORY OF PRESENT ILLNESS::Logan Price is a 76 y.o. male who I know well.  He is currently receiving palliative radiation for thoracic spine metastasis.  MRI of his brain was recently completed due to an incidental lesion that had been revealed in his cerebellum on a previous CT scan.  I reviewed the patient's MRI results with him and his significant other today.  It shows at least 5 brain metastases.  He continues to deny any new neurologic issues ? ?He will see Dr. Al Pimple, medical oncology next week ? ?PREVIOUS RADIATION THERAPY: Yes   ? 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   ? ?PAST SURGICAL HISTORY: ?Past Surgical History:  ?Procedure Laterality Date  ? BIOPSY  05/04/2015  ? Procedure: BIOPSY;  Surgeon: West Bali, MD;  [facility];  Service: Endoscopy;;  bx's of ileocecal valve ?  ? BIOPSY  06/22/2020  ? Procedure: BIOPSY;  Surgeon: Lanelle Bal, DO;  [facility];  Service: Endoscopy;;  ? BRONCHIAL BRUSHINGS  04/29/2019  ? Procedure: BRONCHIAL BRUSHINGS;  Surgeon: Josephine Igo, DO;  [facility];  Service: Pulmonary;;  ? BRONCHIAL NEEDLE ASPIRATION BIOPSY  04/29/2019  ? Procedure: BRONCHIAL NEEDLE ASPIRATION BIOPSIES;  Surgeon: Josephine Igo, DO;  [facility];  Service: Pulmonary;;  ? BRONCHIAL WASHINGS  04/29/2019  ? Procedure: BRONCHIAL WASHINGS;  Surgeon: Josephine Igo, DO;  [facility];  Service: Pulmonary;;  ? COLONOSCOPY WITH PROPOFOL N/A 05/04/2015  ? Dr. Darrick Penna: normal appearing ileum with prominent IC valve with tubular adenomas, moderate diverticulosis in sigmoid colon, ascending colon, and retum. Moderate sized internal hemorrhoids. Surveillance in 5 years  ? COLONOSCOPY WITH PROPOFOL N/A 06/22/2020  ? Procedure: COLONOSCOPY WITH PROPOFOL;  Surgeon: Lanelle Bal, DO;  [facility];  Service: Endoscopy;  Laterality: N/A;  PM  ? ELECTROMAGNETIC NAVIGATION BROCHOSCOPY  04/29/2019  ? Procedure: NAVIGATION BRONCHOSCOPY;  Surgeon: Josephine Igo, DO;  [facility];  Service: Pulmonary;;  ? ESOPHAGOGASTRODUODENOSCOPY (EGD) WITH PROPOFOL N/A 12/19/2016  ? Procedure: ESOPHAGOGASTRODUODENOSCOPY (EGD) WITH PROPOFOL;  Surgeon: West Bali, MD;  [facility];  Service: Endoscopy;  Laterality: N/A;  11:30am  ? FLEXIBLE SIGMOIDOSCOPY N/A 12/10/2015  ? hemorrhoid banding X 3   ? HEMORRHOID BANDING N/A 12/10/2015  ? Procedure: HEMORRHOID BANDING;  Surgeon: West Bali, MD;  [facility];  Service: Endoscopy;  Laterality: N/A;  1:30 PM  ? INSERTION OF SUPRAPUBIC CATHETER N/A 02/24/2019  ? Procedure: INSERTION OF SUPRAPUBIC CATHETER;  Surgeon: Ihor Gully, MD;  [facility]: [facility];  Service: Urology;  Laterality: N/A;  ? IR CM INJ ANY COLONIC TUBE W/FLUORO  10/30/2018  ? IR GASTROSTOMY TUBE MOD SED  10/04/2018  ? IR GASTROSTOMY TUBE REMOVAL  10/08/2019  ? IR IMAGING  GUIDED PORT INSERTION  10/04/2018  ? IR REMOVAL TUN ACCESS W/ PORT W/O FL MOD SED  10/14/2018  ? IR REPLACE G-TUBE SIMPLE WO FLUORO  11/03/2018  ? IR REPLACE G-TUBE SIMPLE WO FLUORO  07/28/2019  ? LAPAROSCOPIC INSERTION GASTROSTOMY TUBE Left 10/07/2018  ? Procedure: LAPAROSCOPIC  GASTROSTOMY TUBE;  Surgeon: Kinsinger, De Blanch, MD;  [facility];   Service: General;  Laterality: Left;  ? MICROLARYNGOSCOPY N/A 09/27/2018  ? Procedure: MICRO DIRECT LARYNGOSCOPY WITH BIOPSY;  Surgeon: Newman Pies, MD;  [facility];  Service: ENT;  Laterality: N/A;  ? None to date    ? As of 04/14/15  ? POLYPECTOMY  05/04/2015  ? Procedure: POLYPECTOMY;  Surgeon: West Bali, MD;  [facility];  Service: Endoscopy;;  descending colon polyp, ascending colon polyp  ? SAVORY DILATION N/A 12/19/2016  ? Procedure: SAVORY DILATION;  Surgeon: West Bali, MD;  [facility];  Service: Endoscopy;  Laterality: N/A;  ? TRACHEOSTOMY TUBE PLACEMENT N/A 09/27/2018  ? Procedure: AWAKE TRACHEOSTOMY;  Surgeon: Newman Pies, MD;  [facility];  Service: ENT;  Laterality: N/A;  ? TRANSURETHRAL RESECTION OF PROSTATE N/A 02/24/2019  ? Procedure: TRANSURETHRAL RESECTION OF THE PROSTATE (TURP);  Surgeon: Ihor Gully, MD;  [facility];  Service: Urology;  Laterality: N/A;  ? VIDEO BRONCHOSCOPY WITH ENDOBRONCHIAL NAVIGATION N/A 04/29/2019  ? Procedure: VIDEO BRONCHOSCOPY;  Surgeon: Josephine Igo, DO;  [facility];  Service: Pulmonary;  Laterality: N/A;  ? ? ?FAMILY HISTORY: family history includes Aneurysm (age of onset: 80) in his brother; Cancer in his mother; Hypertension in his brother, father, sister, and sister. ? ?SOCIAL HISTORY:  reports that he quit smoking about 14 years ago. His smoking use included cigarettes. He has a 40.00 pack-year smoking history. He has never used smokeless tobacco. He reports that he does not currently use alcohol. He reports that he does not use drugs. ? ?ALLERGIES: Patient has no known allergies. ? ?MEDICATIONS:  ?Current Outpatient Medications  ?Medication Sig Dispense Refill  ? benzonatate (TESSALON PERLES) 100 MG capsule Take 1 capsule (100 mg total) by mouth 3 (three) times daily as needed. 20 capsule 0  ? doxazosin (CARDURA) 1 MG tablet Place 1 tablet (1 mg total) into feeding tube daily. (Patient taking differently: Take 1  mg by mouth daily.) 30 tablet 5  ? ondansetron (ZOFRAN) 4 MG tablet Take 1 tablet (4 mg total) by mouth every 8 (eight) hours as needed for nausea or vomiting. 20 tablet 0  ? pantoprazole (PROTONIX) 40 MG tablet TAKE ONE TABLET DAILY 30 MINUTES PRIOR TO THE FIRST MEAL 90 tablet 1  ? simvastatin (ZOCOR) 20 MG tablet TAKE 1 TABLET DAILY AT 6PM 90 tablet 1  ? ?No current facility-administered medications for this visit.  ? ? ?REVIEW OF SYSTEMS:  As above. ?  ?PHYSICAL EXAM:  vitals were not taken for this visit.   ?General: Alert and oriented, in no acute distress  ?Neurologic: Cranial nerves II through XII are grossly intact. No obvious focalities. Speech is fluent. Coordination is intact. ?Psychiatric: Judgment and insight are intact. Affect is appropriate. ? ?KPS = 70 ? ?100 - Normal; no complaints; no evidence of disease. ?90   - Able to carry on normal activity; minor signs or symptoms of disease. ?80   - Normal activity with effort; some signs or symptoms of disease. ?67   - Cares for self; unable to carry on normal activity or to do  active work. ?60   - Requires occasional assistance, but is able to care for most of his personal needs. ?50   - Requires considerable assistance and frequent medical care. ?43   - Disabled; requires special care and assistance. ?30   - Severely disabled; hospital admission is indicated although death not imminent. ?20   - Very sick; hospital admission necessary; active supportive treatment necessary. ?10   - Moribund; fatal processes progressing rapidly. ?0     - Dead ? ?Karnofsky DA, Abelmann WH, Craver LS and Burchenal [facility] [phone]) The use of the nitrogen mustards in the palliative treatment of carcinoma: with particular reference to bronchogenic carcinoma Cancer 1 634-56 ? ? ?LABORATORY DATA:  ?Lab Results  ?Component Value Date  ? WBC 4.4 10/07/2020  ? HGB 12.9 (L) 10/07/2020  ? HCT 39.5 10/07/2020  ? MCV 91 10/07/2020  ? PLT 185 10/07/2020  ? ?CMP  ?   ?Component Value Date/Time   ? NA 141 10/07/2020 1030  ? K 4.0 10/07/2020 1030  ? CL 103 10/07/2020 1030  ? CO2 25 10/07/2020 1030  ? GLUCOSE 121 (H) 10/07/2020 1030  ? GLUCOSE 101 (H) 09/18/2019 1030  ? BUN 15 06/02/2021 1340  ? BUN 12 07/14/

## 2021-06-21 ENCOUNTER — Ambulatory Visit
Admission: RE | Admit: 2021-06-21 | Discharge: 2021-06-21 | Disposition: A | Discharge status: Home / Self Care | Payer: Medicare Other | Source: Ambulatory Visit | Attending: Radiation Oncology | Admitting: Radiation Oncology

## 2021-06-21 ENCOUNTER — Other Ambulatory Visit: Payer: Self-pay

## 2021-06-21 DIAGNOSIS — C7931 Secondary malignant neoplasm of brain: Secondary | ICD-10-CM | POA: Diagnosis not present

## 2021-06-21 DIAGNOSIS — C7951 Secondary malignant neoplasm of bone: Secondary | ICD-10-CM | POA: Diagnosis not present

## 2021-06-21 DIAGNOSIS — Z510 Encounter for antineoplastic radiation therapy: Secondary | ICD-10-CM | POA: Diagnosis not present

## 2021-06-21 DIAGNOSIS — Z87891 Personal history of nicotine dependence: Secondary | ICD-10-CM | POA: Diagnosis not present

## 2021-06-21 DIAGNOSIS — C760 Malignant neoplasm of head, face and neck: Secondary | ICD-10-CM | POA: Diagnosis not present

## 2021-06-21 DIAGNOSIS — C320 Malignant neoplasm of glottis: Secondary | ICD-10-CM | POA: Diagnosis not present

## 2021-06-21 DIAGNOSIS — C3412 Malignant neoplasm of upper lobe, left bronchus or lung: Secondary | ICD-10-CM | POA: Diagnosis not present

## 2021-06-22 ENCOUNTER — Ambulatory Visit
Admission: RE | Admit: 2021-06-22 | Discharge: 2021-06-22 | Disposition: A | Discharge status: Home / Self Care | Payer: Medicare Other | Source: Ambulatory Visit | Attending: Radiation Oncology | Admitting: Radiation Oncology

## 2021-06-22 DIAGNOSIS — C760 Malignant neoplasm of head, face and neck: Secondary | ICD-10-CM | POA: Diagnosis not present

## 2021-06-22 DIAGNOSIS — I10 Essential (primary) hypertension: Secondary | ICD-10-CM | POA: Diagnosis not present

## 2021-06-22 DIAGNOSIS — C7931 Secondary malignant neoplasm of brain: Secondary | ICD-10-CM | POA: Diagnosis not present

## 2021-06-22 DIAGNOSIS — C320 Malignant neoplasm of glottis: Secondary | ICD-10-CM | POA: Diagnosis not present

## 2021-06-22 DIAGNOSIS — Z87891 Personal history of nicotine dependence: Secondary | ICD-10-CM | POA: Diagnosis not present

## 2021-06-22 DIAGNOSIS — C7951 Secondary malignant neoplasm of bone: Secondary | ICD-10-CM | POA: Diagnosis not present

## 2021-06-22 DIAGNOSIS — Z510 Encounter for antineoplastic radiation therapy: Secondary | ICD-10-CM | POA: Diagnosis not present

## 2021-06-22 DIAGNOSIS — C3412 Malignant neoplasm of upper lobe, left bronchus or lung: Secondary | ICD-10-CM | POA: Diagnosis not present

## 2021-06-23 ENCOUNTER — Ambulatory Visit
Admission: RE | Admit: 2021-06-23 | Discharge: 2021-06-23 | Disposition: A | Discharge status: Home / Self Care | Payer: Medicare Other | Source: Ambulatory Visit | Attending: Radiation Oncology | Admitting: Radiation Oncology

## 2021-06-23 ENCOUNTER — Other Ambulatory Visit: Payer: Self-pay

## 2021-06-23 ENCOUNTER — Inpatient Hospital Stay: Payer: Medicare Other

## 2021-06-23 ENCOUNTER — Inpatient Hospital Stay (HOSPITAL_BASED_OUTPATIENT_CLINIC_OR_DEPARTMENT_OTHER): Payer: Medicare Other | Admitting: Hematology and Oncology

## 2021-06-23 DIAGNOSIS — Z8521 Personal history of malignant neoplasm of larynx: Secondary | ICD-10-CM

## 2021-06-23 DIAGNOSIS — C7951 Secondary malignant neoplasm of bone: Secondary | ICD-10-CM | POA: Diagnosis not present

## 2021-06-23 DIAGNOSIS — Z87891 Personal history of nicotine dependence: Secondary | ICD-10-CM

## 2021-06-23 DIAGNOSIS — R918 Other nonspecific abnormal finding of lung field: Secondary | ICD-10-CM

## 2021-06-23 DIAGNOSIS — C7931 Secondary malignant neoplasm of brain: Secondary | ICD-10-CM

## 2021-06-23 DIAGNOSIS — Z510 Encounter for antineoplastic radiation therapy: Secondary | ICD-10-CM | POA: Diagnosis not present

## 2021-06-23 DIAGNOSIS — Z79899 Other long term (current) drug therapy: Secondary | ICD-10-CM | POA: Diagnosis not present

## 2021-06-23 DIAGNOSIS — Z923 Personal history of irradiation: Secondary | ICD-10-CM | POA: Diagnosis not present

## 2021-06-23 DIAGNOSIS — C801 Malignant (primary) neoplasm, unspecified: Secondary | ICD-10-CM

## 2021-06-23 DIAGNOSIS — C3412 Malignant neoplasm of upper lobe, left bronchus or lung: Secondary | ICD-10-CM | POA: Diagnosis not present

## 2021-06-23 DIAGNOSIS — C320 Malignant neoplasm of glottis: Secondary | ICD-10-CM | POA: Diagnosis not present

## 2021-06-23 DIAGNOSIS — C760 Malignant neoplasm of head, face and neck: Secondary | ICD-10-CM | POA: Diagnosis not present

## 2021-06-23 NOTE — Progress Notes (Signed)
[facility] ?CONSULT NOTE ? ?Patient Care Team: ?Sharion Balloon, FNP as PCP - General (Nurse Practitioner) ?Fields, Marga Melnick, MD (Inactive) as Consulting Physician (Gastroenterology) ?Eppie Gibson, MD as Attending Physician (Radiation Oncology) ?Leota Sauers, RN (Inactive) as Oncology Nurse Navigator ?Leta Baptist, MD as Consulting Physician (Otolaryngology) ?Katha Cabal, LCSW as [facility] [employer] (Licensed [employer] representative) ?Eloise Harman, DO as Consulting Physician (Internal Medicine) ? ?CHIEF COMPLAINTS/PURPOSE OF CONSULTATION:  ?Metastatic carcinoma ? ?ASSESSMENT & PLAN:  ?No problem-specific Assessment & Plan notes found for this encounter. ? ?No orders of the defined types were placed in this encounter. ? ? ? ?HISTORY OF PRESENTING ILLNESS:  ?Logan Price 76 y.o. male is here because of metastatic carcinoma ? ?Chronology ? 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 ?He then had PET which suggested no residual FDG avid tumor with in the neck, however found solid nodule within the anterior right middle lobe, FDG avid and worrisome for either metastatic disease or primary bronchogenic carcinoma, persistent subsolid nodule within the lingula with mild FDG uptake, SUV max 3.8. Low grade pulmonary adenocarcinoma cannot be excluded, mild increased uptake identified with in bilateral hilar regions and left AP window region, cannot exclude nodal mets. ?Pathology from right upper lobe biopsy, showed SCC,  P16 neg in the tumor cells. ?Pathology from left upper lobe biopsy showed focal atypical glands suspicious for adenocarcinoma. ?He underwent radiation again to the right upper lobe and left upper  lobe on 06/13/2019 ?He recently underwent CT which showed cerebellar lesions as well as T 5 lytic lesion ?MRI brain showed 5 brain metastases, largest measuring 2.1 cms in the right cerebellum where there is vasogenic edema with local mass effect. No hydrocephalus. ?He was presented in ENT TB and radiation oncology recommended palliative radiation to T5 as well as WBRT. Patient however wanted to proceed with SRS. ? ?He is here to discuss systemic options with his wife. He denies any new complaints. ?No pain. No change in balance, coordination, gait or new headaches. ?He denies any new health complaints at all. ?Rest of the pertinent 10 point ROS reviewed and negative. ? ?MEDICAL HISTORY:  ?Past Medical History:  ?Diagnosis Date  ? Diverticulitis   ? Dysrhythmia 09/2018  ? episode of SVT while in hospital  ? False positive serological test for hepatitis C 12/13/2016  ? GERD (gastroesophageal reflux disease)   ? 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  ? trach  ? HTN (hypertension)   ? Hyperlipidemia   ? ? ?SURGICAL HISTORY: ?Past Surgical History:  ?Procedure Laterality Date  ? BIOPSY  05/04/2015  ? Procedure: BIOPSY;  Surgeon: Danie Binder, MD;  [facility];  Service: Endoscopy;;  bx's of ileocecal valve ?  ? BIOPSY  06/22/2020  ? Procedure: BIOPSY;  Surgeon: Eloise Harman, DO;  [facility];  Service: Endoscopy;;  ? BRONCHIAL BRUSHINGS  04/29/2019  ? Procedure: BRONCHIAL BRUSHINGS;  Surgeon: Garner Nash, DO;  Location: [facility];  Service: Pulmonary;;  ? BRONCHIAL NEEDLE ASPIRATION BIOPSY  04/29/2019  ? Procedure: BRONCHIAL NEEDLE ASPIRATION BIOPSIES;  Surgeon: Garner Nash, DO;  Location: [facility];  Service: Pulmonary;;  ? BRONCHIAL WASHINGS  04/29/2019  ? Procedure: BRONCHIAL WASHINGS;  Surgeon: Valeta Harms,  Octavio Graves, DO;  Location: [facility];  Service: Pulmonary;;  ? COLONOSCOPY WITH PROPOFOL N/A 05/04/2015  ? Dr. Oneida Alar: normal appearing ileum with prominent IC valve with tubular adenomas, moderate  diverticulosis in sigmoid colon, ascending colon, and retum. Moderate sized internal hemorrhoids. Surveillance in 5 years  ? COLONOSCOPY WITH PROPOFOL N/A 06/22/2020  ? Procedure: COLONOSCOPY WITH PROPOFOL;  Surgeon: Eloise Harman, DO;  [facility];  Service: Endoscopy;  Laterality: N/A;  PM  ? ELECTROMAGNETIC NAVIGATION BROCHOSCOPY  04/29/2019  ? Procedure: NAVIGATION BRONCHOSCOPY;  Surgeon: Garner Nash, DO;  Location: [facility];  Service: Pulmonary;;  ? ESOPHAGOGASTRODUODENOSCOPY (EGD) WITH PROPOFOL N/A 12/19/2016  ? Procedure: ESOPHAGOGASTRODUODENOSCOPY (EGD) WITH PROPOFOL;  Surgeon: Danie Binder, MD;  [facility];  Service: Endoscopy;  Laterality: N/A;  11:30am  ? FLEXIBLE SIGMOIDOSCOPY N/A 12/10/2015  ? hemorrhoid banding X 3   ? HEMORRHOID BANDING N/A 12/10/2015  ? Procedure: HEMORRHOID BANDING;  Surgeon: Danie Binder, MD;  [facility];  Service: Endoscopy;  Laterality: N/A;  1:30 PM  ? INSERTION OF SUPRAPUBIC CATHETER N/A 02/24/2019  ? Procedure: INSERTION OF SUPRAPUBIC CATHETER;  Surgeon: Kathie Rhodes, MD;  [facility];  Service: Urology;  Laterality: N/A;  ? IR CM INJ ANY COLONIC TUBE W/FLUORO  10/30/2018  ? IR GASTROSTOMY TUBE MOD SED  10/04/2018  ? IR GASTROSTOMY TUBE REMOVAL  10/08/2019  ? IR IMAGING GUIDED PORT INSERTION  10/04/2018  ? IR REMOVAL TUN ACCESS W/ PORT W/O FL MOD SED  10/14/2018  ? IR REPLACE G-TUBE SIMPLE WO FLUORO  11/03/2018  ? IR REPLACE G-TUBE SIMPLE WO FLUORO  07/28/2019  ? LAPAROSCOPIC INSERTION GASTROSTOMY TUBE Left 10/07/2018  ? Procedure: LAPAROSCOPIC  GASTROSTOMY TUBE;  Surgeon: Kinsinger, Arta Bruce, MD;  Location: [facility];  Service: General;  Laterality: Left;  ? MICROLARYNGOSCOPY N/A 09/27/2018  ? Procedure: MICRO DIRECT LARYNGOSCOPY WITH BIOPSY;  Surgeon: Leta Baptist, MD;  [facility];  Service: ENT;  Laterality: N/A;  ? None to date    ? As of 04/14/15  ? POLYPECTOMY  05/04/2015  ? Procedure: POLYPECTOMY;  Surgeon: Danie Binder, MD;   [facility];  Service: Endoscopy;;  descending colon polyp, ascending colon polyp  ? SAVORY DILATION N/A 12/19/2016  ? Procedure: SAVORY DILATION;  Surgeon: Danie Binder, MD;  [facility];  Service: Endoscopy;  Laterality: N/A;  ? TRACHEOSTOMY TUBE PLACEMENT N/A 09/27/2018  ? Procedure: AWAKE TRACHEOSTOMY;  Surgeon: Leta Baptist, MD;  Location: [facility];  Service: ENT;  Laterality: N/A;  ? TRANSURETHRAL RESECTION OF PROSTATE N/A 02/24/2019  ? Procedure: TRANSURETHRAL RESECTION OF THE PROSTATE (TURP);  Surgeon: Kathie Rhodes, MD;  [facility];  Service: Urology;  Laterality: N/A;  ? VIDEO BRONCHOSCOPY WITH ENDOBRONCHIAL NAVIGATION N/A 04/29/2019  ? Procedure: VIDEO BRONCHOSCOPY;  Surgeon: Garner Nash, DO;  Location: [facility];  Service: Pulmonary;  Laterality: N/A;  ? ? ?SOCIAL HISTORY: ?Social History  ? ?Socioeconomic History  ? Marital status: Married  ?  Spouse name: [name]  ? Number of children: 9  ? Years of education: GED  ? Highest education level: GED or equivalent  ?Occupational History  ? Occupation: Retired  ?  Comment: [employer]  ?Tobacco Use  ? Smoking status: Former  ?  Packs/day: 1.00  ?  Years: 40.00  ?  Pack years: 40.00  ?  Types: Cigarettes  ?  Quit date: 10/09/2006  ?  Years since quitting: 14.7  ? Smokeless tobacco: Never  ?Vaping Use  ? Vaping Use: Never used  ?Substance and Sexual Activity  ? Alcohol use: Not Currently  ?  Alcohol/week: 0.0 standard drinks  ?  Comment: he denies 01/06/19  ? Drug use: No  ? Sexual activity: Yes  ?  Partners: Female  ?  Birth control/protection: None  ?Other Topics Concern  ? Not on file  ?Social History Narrative  ? Lives with wife  ? Most of family lives in [place]  ? 9 children  ?   ? GED  ? ?Social Determinants of Health  ? ?Financial Resource Strain: Low Risk   ? Difficulty of Paying Living Expenses: Not hard at all  ?Food Insecurity: No Food Insecurity  ? Worried About Charity fundraiser in the Last Year:  Never true  ? Ran Out of Food in the Last Year: Never true  ?Transportation Needs: No Transportation Needs  ? Lack of Transportation (Medical): No  ? Lack of Transportation (Non-Medical): No  ?Physical Activi

## 2021-06-24 ENCOUNTER — Encounter: Payer: Self-pay | Admitting: Radiation Oncology

## 2021-06-24 ENCOUNTER — Encounter: Payer: Self-pay | Admitting: Hematology and Oncology

## 2021-06-24 ENCOUNTER — Ambulatory Visit
Admission: RE | Admit: 2021-06-24 | Discharge: 2021-06-24 | Disposition: A | Discharge status: Home / Self Care | Payer: Medicare Other | Source: Ambulatory Visit | Attending: Radiation Oncology | Admitting: Radiation Oncology

## 2021-06-24 DIAGNOSIS — Z87891 Personal history of nicotine dependence: Secondary | ICD-10-CM | POA: Diagnosis not present

## 2021-06-24 DIAGNOSIS — Z510 Encounter for antineoplastic radiation therapy: Secondary | ICD-10-CM | POA: Diagnosis not present

## 2021-06-24 DIAGNOSIS — C7951 Secondary malignant neoplasm of bone: Secondary | ICD-10-CM | POA: Diagnosis not present

## 2021-06-24 DIAGNOSIS — C760 Malignant neoplasm of head, face and neck: Secondary | ICD-10-CM | POA: Diagnosis not present

## 2021-06-24 DIAGNOSIS — C320 Malignant neoplasm of glottis: Secondary | ICD-10-CM | POA: Diagnosis not present

## 2021-06-24 DIAGNOSIS — C7931 Secondary malignant neoplasm of brain: Secondary | ICD-10-CM | POA: Diagnosis not present

## 2021-06-24 DIAGNOSIS — C3412 Malignant neoplasm of upper lobe, left bronchus or lung: Secondary | ICD-10-CM | POA: Diagnosis not present

## 2021-06-24 NOTE — Progress Notes (Addendum)
Nurse monitoring complete status post 1 of 1 SRS treatments. Patient without complaints. Patient denies new or worsening neurologic symptoms. Vitals stable. Instructed patient to avoid strenuous activity for the next 24 hours.  Instructed patient to call [phone] with needs related to treatment after hours or over the weekend. Patient and wife verbalized understanding/agreement. Patient ambulated out of clinic using rolling walker to wife's vehicle without incident ? ?Vitals:  ? 06/24/21 1621  ?BP: (!) 153/80  ?Pulse: 81  ?Resp: 20  ?Temp: 97.8 ?F (36.6 ?C)  ?SpO2: 100%  ? ? ?

## 2021-06-24 NOTE — Assessment & Plan Note (Signed)
This is a very pleasant 76 yr old male patient with new evidence of brain metastases, T5 lesion, pathology unknown currently undergoing palliative radiation to the T 5 lesion and will be receiving SRS to the brain mets, referred to oncology for systemic recommendations. ?He is scheduled for PET scan to review status of systemic disease. ?At this time, if he has any other evidence of systemic disease, we will proceed with biopsy to determine pathology. ?If he has no other evidence of systemic disease, then we can continue to monitor since his brain disease and T5 are being treated with palliative radiation. He is very worried about the idea of chemotherapy. ?He will RTC with me in 2 weeks to review plans and treatment recommendations. ?

## 2021-06-27 NOTE — Op Note (Signed)
?  Name: Logan Price  MRN: [id number]  ?Date: 06/24/2021   DOB: 1945-08-31 ? ?Stereotactic Radiosurgery Operative Note ? ?PRE-OPERATIVE DIAGNOSIS:  Multiple Brain Metastases ? ?POST-OPERATIVE DIAGNOSIS:  Multiple Brain Metastases ? ?PROCEDURE:  Stereotactic Radiosurgery ? ?SURGEON:  Jackelyn Hoehn, MD ? ?NARRATIVE: The patient underwent a radiation treatment planning session in the radiation oncology simulation suite under the care of the radiation oncology physician and physicist.  I participated closely in the radiation treatment planning afterwards. The patient underwent planning CT which was fused to 3T high resolution MRI with 1 mm axial slices.  These images were fused on the planning system.  We contoured the gross target volumes and subsequently expanded this to yield the Planning Target Volume. I actively participated in the planning process.  I helped to define and review the target contours and also the contours of the optic pathway, eyes, brainstem and selected nearby organs at risk.  All the dose constraints for critical structures were reviewed and compared to AAPM Task Group 101.  The prescription dose conformity was reviewed.  I approved the plan electronically.   ? ?Accordingly, [name] was brought to the TrueBeam stereotactic radiation treatment linac and placed in the custom immobilization mask.  The patient was aligned according to the IR fiducial markers with BrainLab Exactrac, then orthogonal x-rays were used in ExacTrac with the 6DOF robotic table and the shifts were made to align the patient ? ?Mosie Epstein received stereotactic radiosurgery uneventfully.   ? ?Lesions treated:  5  ? ?Complex lesions treated:  0 (>3.5 cm, <75mm of optic path, or within the brainstem)  ? ?The detailed description of the procedure is recorded in the radiation oncology procedure note.  I was present for the duration of the procedure. ? ?DISPOSITION:  Following delivery, the patient was transported to  nursing in stable condition and monitored for possible acute effects to be discharged to home in stable condition with follow-up in one month. ? ?Jackelyn Hoehn, MD ?06/27/2021 5:42 PM ? ?

## 2021-06-29 DIAGNOSIS — R1312 Dysphagia, oropharyngeal phase: Secondary | ICD-10-CM | POA: Diagnosis not present

## 2021-06-29 DIAGNOSIS — Z8521 Personal history of malignant neoplasm of larynx: Secondary | ICD-10-CM | POA: Diagnosis not present

## 2021-06-30 ENCOUNTER — Telehealth: Payer: Self-pay | Admitting: Family

## 2021-06-30 DIAGNOSIS — I4891 Unspecified atrial fibrillation: Secondary | ICD-10-CM | POA: Diagnosis not present

## 2021-06-30 DIAGNOSIS — C320 Malignant neoplasm of glottis: Secondary | ICD-10-CM | POA: Diagnosis not present

## 2021-06-30 DIAGNOSIS — M545 Low back pain, unspecified: Secondary | ICD-10-CM | POA: Diagnosis not present

## 2021-06-30 DIAGNOSIS — I10 Essential (primary) hypertension: Secondary | ICD-10-CM | POA: Diagnosis not present

## 2021-06-30 DIAGNOSIS — M5432 Sciatica, left side: Secondary | ICD-10-CM | POA: Diagnosis not present

## 2021-06-30 DIAGNOSIS — R531 Weakness: Secondary | ICD-10-CM

## 2021-06-30 DIAGNOSIS — M5431 Sciatica, right side: Secondary | ICD-10-CM | POA: Diagnosis not present

## 2021-06-30 NOTE — Telephone Encounter (Signed)
PT ordered.

## 2021-07-07 ENCOUNTER — Encounter (HOSPITAL_COMMUNITY)
Admission: RE | Admit: 2021-07-07 | Discharge: 2021-07-07 | Disposition: A | Discharge status: Home / Self Care | Payer: Medicare Other | Source: Ambulatory Visit | Attending: Radiation Oncology | Admitting: Radiation Oncology

## 2021-07-07 DIAGNOSIS — C320 Malignant neoplasm of glottis: Secondary | ICD-10-CM | POA: Diagnosis not present

## 2021-07-07 DIAGNOSIS — I2510 Atherosclerotic heart disease of native coronary artery without angina pectoris: Secondary | ICD-10-CM | POA: Diagnosis not present

## 2021-07-07 DIAGNOSIS — C3412 Malignant neoplasm of upper lobe, left bronchus or lung: Secondary | ICD-10-CM | POA: Diagnosis not present

## 2021-07-07 DIAGNOSIS — C7931 Secondary malignant neoplasm of brain: Secondary | ICD-10-CM | POA: Diagnosis not present

## 2021-07-07 DIAGNOSIS — C3411 Malignant neoplasm of upper lobe, right bronchus or lung: Secondary | ICD-10-CM | POA: Insufficient documentation

## 2021-07-07 DIAGNOSIS — K4090 Unilateral inguinal hernia, without obstruction or gangrene, not specified as recurrent: Secondary | ICD-10-CM | POA: Diagnosis not present

## 2021-07-07 DIAGNOSIS — C3490 Malignant neoplasm of unspecified part of unspecified bronchus or lung: Secondary | ICD-10-CM | POA: Diagnosis not present

## 2021-07-07 MED ORDER — FLUDEOXYGLUCOSE F - 18 (FDG) INJECTION
9.2200 | Freq: Once | INTRAVENOUS | Status: AC | PRN
  Administered 2021-07-07: 9.22 via INTRAVENOUS

## 2021-07-11 DIAGNOSIS — Z20822 Contact with and (suspected) exposure to covid-19: Secondary | ICD-10-CM | POA: Diagnosis not present

## 2021-07-14 ENCOUNTER — Inpatient Hospital Stay: Payer: Medicare Other | Attending: Radiation Oncology | Admitting: Hematology and Oncology

## 2021-07-14 ENCOUNTER — Encounter: Payer: Self-pay | Admitting: Hematology and Oncology

## 2021-07-14 ENCOUNTER — Other Ambulatory Visit: Payer: Self-pay

## 2021-07-14 VITALS — BP 159/81 | HR 94 | Temp 97.9°F | Resp 17 | Wt 160.5 lb

## 2021-07-14 DIAGNOSIS — C7931 Secondary malignant neoplasm of brain: Secondary | ICD-10-CM | POA: Diagnosis not present

## 2021-07-14 DIAGNOSIS — Z87891 Personal history of nicotine dependence: Secondary | ICD-10-CM | POA: Diagnosis not present

## 2021-07-14 DIAGNOSIS — Z79899 Other long term (current) drug therapy: Secondary | ICD-10-CM | POA: Insufficient documentation

## 2021-07-14 DIAGNOSIS — C320 Malignant neoplasm of glottis: Secondary | ICD-10-CM | POA: Insufficient documentation

## 2021-07-14 DIAGNOSIS — C770 Secondary and unspecified malignant neoplasm of lymph nodes of head, face and neck: Secondary | ICD-10-CM

## 2021-07-14 DIAGNOSIS — C7951 Secondary malignant neoplasm of bone: Secondary | ICD-10-CM | POA: Diagnosis not present

## 2021-07-14 DIAGNOSIS — Z9079 Acquired absence of other genital organ(s): Secondary | ICD-10-CM | POA: Diagnosis not present

## 2021-07-14 DIAGNOSIS — Z923 Personal history of irradiation: Secondary | ICD-10-CM | POA: Diagnosis not present

## 2021-07-14 NOTE — Progress Notes (Signed)
Logan Price ?CONSULT NOTE ? ?Patient Care Team: ?Sharion Balloon, FNP as PCP - General (Nurse Practitioner) ?Fields, Marga Melnick, MD (Inactive) as Consulting Physician (Gastroenterology) ?Eppie Gibson, MD as Attending Physician (Radiation Oncology) ?Leota Sauers, RN (Inactive) as Oncology Nurse Navigator ?Leta Baptist, MD as Consulting Physician (Otolaryngology) ?Katha Cabal, LCSW as [employer] (Licensed [employer] representative) ?Eloise Harman, DO as Consulting Physician (Internal Medicine) ? ?CHIEF COMPLAINTS/PURPOSE OF CONSULTATION:  ?Metastatic carcinoma ? ?ASSESSMENT & PLAN:  ?No problem-specific Assessment & Plan notes found for this encounter. ? ?Orders Placed This Encounter  ?Procedures  ? CT CHEST ABDOMEN PELVIS W CONTRAST  ?  Standing Status:   Future  ?  Standing Expiration Date:   07/15/2022  ?  Order Specific Question:   Preferred imaging location?  ?  Answer:   [facility]  ?  Order Specific Question:   Is Oral Contrast requested for this exam?  ?  Answer:   Yes, Per Radiology protocol  ?  Order Specific Question:   Call Results- Best Contact Number?  ?  Answer:   metastatic Head and neck cancer surveillance  ? 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  He then had a PET/CT which showed no residual tumor however was found to have a solid nodule within the anterior right middle lobe FDG avid and worrisome for primary bronchogenic carcinoma.  He had biopsy of this nodule which showed squamous cell carcinoma p16 negative.  He had another biopsy from the left upper lobe nodule which showed focal atypical glands suspicious for adenocarcinoma.  He then went underwent radiation to both these areas.  Most recent CT showed cerebellar lesions as well as a T5 lytic lesion.  He then  underwent SRS for his 5 brain metastasis and he is doing well.  He has a follow-up with Dr. Isidore Moos for monitoring of the brain metastasis.  He is here to review his PET/CT imaging results and treatment recommendations. ? ?PET/CT on 07/07/2021 showed new destructive T5 vertebral body lesion which is hypermetabolic and consistent with metastatic disease.  Spinal canal involvement is noted. ?Recommend MRI thoracic spine without and with contrast for further evaluation. ? ?He will undergo radiation to the T5 lesion.  Since there is no other evidence of metastatic disease, I recommended follow-up with surveillance scans.  I have ordered CT scans chest abdomen pelvis in 4 months.  He will continue to have surveillance for the brain disease by Dr. Isidore Moos.  He is agreeable to these recommendations.   ? ?I have reviewed PET/CT imaging today with patient and his wife. ?He was strongly encouraged to contact us with any new questions or concerns. ? ?HISTORY OF PRESENTING ILLNESS:  ?Logan Price 76 y.o. male is here because of metastatic carcinoma ? ?Chronology ? 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 ?He then had PET which suggested no residual FDG avid tumor with in the neck, however found solid nodule within the anterior right middle lobe, FDG avid and worrisome for either metastatic disease or primary bronchogenic carcinoma, persistent subsolid nodule within the lingula with  mild FDG uptake, SUV max 3.8. Low grade pulmonary adenocarcinoma cannot be excluded, mild increased uptake identified with in bilateral hilar regions and left AP window region, cannot exclude nodal mets. ?Pathology from right upper lobe biopsy, showed SCC,  P16 neg in the tumor  cells. ?Pathology from left upper lobe biopsy showed focal atypical glands suspicious for adenocarcinoma. ?He underwent radiation again to the right upper lobe and left upper lobe on 06/13/2019 ?He recently underwent CT which showed cerebellar lesions as well as T 5 lytic lesion ?MRI brain showed 5 brain metastases, largest measuring 2.1 cms in the right cerebellum where there is vasogenic edema with local mass effect. No hydrocephalus. ?He was presented in ENT TB and radiation oncology recommended palliative radiation to T5 as well as WBRT. Patient however wanted to proceed with SRS. ? ?Since his last visit he underwent a PET scan. ? ?Interval History ?Logan Price is here for a follow up. ?He is doing well.  Pain in the back appears to be stable since radiation.  No new pains.  He is balance is a bit off but he attributes that to his hip osteoarthritis.  No falls.  No seizures coordination has been good.  No change in breathing. ?Rest of the pertinent 10 point ROS reviewed and negative. ? ?MEDICAL HISTORY:  ?Past Medical History:  ?Diagnosis Date  ? Diverticulitis   ? Dysrhythmia 09/2018  ? episode of SVT while in hospital  ? False positive serological test for hepatitis C 12/13/2016  ? GERD (gastroesophageal reflux disease)   ? 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  ? trach  ? HTN (hypertension)   ? Hyperlipidemia   ? ? ?SURGICAL HISTORY: ?Past Surgical History:  ?Procedure Laterality Date  ? BIOPSY  05/04/2015  ? Procedure: BIOPSY;  Surgeon: Danie Binder, MD;  [facility];  Service: Endoscopy;;  bx's of ileocecal valve ?  ? BIOPSY  06/22/2020  ? Procedure: BIOPSY;  Surgeon: Eloise Harman, DO;  [facility];  Service: Endoscopy;;  ? BRONCHIAL BRUSHINGS  04/29/2019  ? Procedure: BRONCHIAL BRUSHINGS;  Surgeon: Garner Nash, DO;  Location: [place];  Service: Pulmonary;;  ? BRONCHIAL NEEDLE ASPIRATION BIOPSY  04/29/2019  ? Procedure: BRONCHIAL NEEDLE ASPIRATION BIOPSIES;  Surgeon: Garner Nash, DO;   Location: [place];  Service: Pulmonary;;  ? BRONCHIAL WASHINGS  04/29/2019  ? Procedure: BRONCHIAL WASHINGS;  Surgeon: Garner Nash, DO;  Location: [place] [facility];  Service: Pulmonary;;  ? COLONOSCOPY WITH PROPOFOL N/A 05/04/2015  ? Dr. Oneida Alar: normal appearing ileum with prominent IC valve with tubular adenomas, moderate diverticulosis in sigmoid colon, ascending colon, and retum. Moderate sized internal hemorrhoids. Surveillance in 5 years  ? COLONOSCOPY WITH PROPOFOL N/A 06/22/2020  ? Procedure: COLONOSCOPY WITH PROPOFOL;  Surgeon: Eloise Harman, DO;  [facility];  Service: Endoscopy;  Laterality: N/A;  PM  ? ELECTROMAGNETIC NAVIGATION BROCHOSCOPY  04/29/2019  ? Procedure: NAVIGATION BRONCHOSCOPY;  Surgeon: Garner Nash, DO;  Location: [place] [facility];  Service: Pulmonary;;  ? ESOPHAGOGASTRODUODENOSCOPY (EGD) WITH PROPOFOL N/A 12/19/2016  ? Procedure: ESOPHAGOGASTRODUODENOSCOPY (EGD) WITH PROPOFOL;  Surgeon: Danie Binder, MD;  [facility];  Service: Endoscopy;  Laterality: N/A;  11:30am  ? FLEXIBLE SIGMOIDOSCOPY N/A 12/10/2015  ? hemorrhoid banding X 3   ? HEMORRHOID BANDING N/A 12/10/2015  ? Procedure: HEMORRHOID BANDING;  Surgeon: Danie Binder, MD;  [facility];  Service: Endoscopy;  Laterality: N/A;  1:30 PM  ? INSERTION OF SUPRAPUBIC  CATHETER N/A 02/24/2019  ? Procedure: INSERTION OF SUPRAPUBIC CATHETER;  Surgeon: Kathie Rhodes, MD;  [facility];  Service: Urology;  Laterality: N/A;  ? IR CM INJ ANY COLONIC TUBE W/FLUORO  10/30/2018  ? IR GASTROSTOMY TUBE MOD SED  10/04/2018  ? IR GASTROSTOMY TUBE REMOVAL  10/08/2019  ? IR IMAGING GUIDED PORT INSERTION  10/04/2018  ? IR REMOVAL TUN ACCESS W/ PORT W/O FL MOD SED  10/14/2018  ? IR REPLACE G-TUBE SIMPLE WO FLUORO  11/03/2018  ? IR REPLACE G-TUBE SIMPLE WO FLUORO  07/28/2019  ? LAPAROSCOPIC INSERTION GASTROSTOMY TUBE Left 10/07/2018  ? Procedure: LAPAROSCOPIC  GASTROSTOMY TUBE;  Surgeon: Kinsinger, Arta Bruce, MD;  Location:  [place];  Service: General;  Laterality: Left;  ? MICROLARYNGOSCOPY N/A 09/27/2018  ? Procedure: MICRO DIRECT LARYNGOSCOPY WITH BIOPSY;  Surgeon: Leta Baptist, MD;  Location: [place];  Service: ENT;  Laterality: N/A;  ? None to d

## 2021-07-15 ENCOUNTER — Telehealth: Payer: Self-pay | Admitting: Hematology and Oncology

## 2021-07-15 NOTE — Telephone Encounter (Signed)
Scheduled appointment per 04/21 los. Patient aware.  ?

## 2021-07-18 ENCOUNTER — Ambulatory Visit (HOSPITAL_COMMUNITY): Payer: Medicare Other | Attending: Family | Admitting: Physical Therapy

## 2021-07-18 ENCOUNTER — Encounter (HOSPITAL_COMMUNITY): Payer: Self-pay | Admitting: Physical Therapy

## 2021-07-18 DIAGNOSIS — M6281 Muscle weakness (generalized): Secondary | ICD-10-CM | POA: Diagnosis not present

## 2021-07-18 DIAGNOSIS — R531 Weakness: Secondary | ICD-10-CM | POA: Insufficient documentation

## 2021-07-18 DIAGNOSIS — R2689 Other abnormalities of gait and mobility: Secondary | ICD-10-CM | POA: Diagnosis not present

## 2021-07-18 NOTE — Therapy (Signed)
?OUTPATIENT PHYSICAL THERAPY LOWER EXTREMITY EVALUATION ? ? ?Patient Name: Logan Price ?MRN: [id number] ?DOB:09/29/1945, 76 y.o., male ?Today's Date: 07/18/2021 ? ? PT End of Session - 07/18/21 1400   ? ? Visit Number 1   ? Number of Visits 12   ? Date for PT Re-Evaluation 08/29/21   ? Authorization Type Primary: Medicare Secondary BCBS (for hospital only)( no coverage for PT)   ? Progress Note Due on Visit 10   ? PT Start Time 1400   ? PT Stop Time 1435   ? PT Time Calculation (min) 35 min   ? Activity Tolerance Patient tolerated treatment well   ? Behavior During Therapy [facility] for tasks assessed/performed   ? ?  ?  ? ?  ? ? ?Past Medical History:  ?Diagnosis Date  ? Diverticulitis   ? Dysrhythmia 09/2018  ? episode of SVT while in hospital  ? False positive serological test for hepatitis C 12/13/2016  ? GERD (gastroesophageal reflux disease)   ? 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  ? trach  ? HTN (hypertension)   ? Hyperlipidemia   ? ?Past Surgical History:  ?Procedure Laterality Date  ? BIOPSY  05/04/2015  ? Procedure: BIOPSY;  Surgeon: West Bali, MD;  [facility];  Service: Endoscopy;;  bx's of ileocecal valve ?  ? BIOPSY  06/22/2020  ? Procedure: BIOPSY;  Surgeon: Lanelle Bal, DO;  [facility];  Service: Endoscopy;;  ? BRONCHIAL BRUSHINGS  04/29/2019  ? Procedure: BRONCHIAL BRUSHINGS;  Surgeon: Josephine Igo, DO;  [facility];  Service: Pulmonary;;  ? BRONCHIAL NEEDLE ASPIRATION BIOPSY  04/29/2019  ? Procedure: BRONCHIAL NEEDLE ASPIRATION BIOPSIES;  Surgeon: Josephine Igo, DO;  [facility];  Service: Pulmonary;;  ? BRONCHIAL WASHINGS  04/29/2019  ? Procedure: BRONCHIAL WASHINGS;  Surgeon: Josephine Igo, DO;  [facility];  Service: Pulmonary;;  ? COLONOSCOPY WITH PROPOFOL N/A 05/04/2015  ? Dr. Darrick Penna: normal appearing ileum with prominent IC valve with tubular adenomas, moderate diverticulosis in sigmoid colon, ascending colon, and retum. Moderate sized internal  hemorrhoids. Surveillance in 5 years  ? COLONOSCOPY WITH PROPOFOL N/A 06/22/2020  ? Procedure: COLONOSCOPY WITH PROPOFOL;  Surgeon: Lanelle Bal, DO;  [facility];  Service: Endoscopy;  Laterality: N/A;  PM  ? ELECTROMAGNETIC NAVIGATION BROCHOSCOPY  04/29/2019  ? Procedure: NAVIGATION BRONCHOSCOPY;  Surgeon: Josephine Igo, DO;  [facility];  Service: Pulmonary;;  ? ESOPHAGOGASTRODUODENOSCOPY (EGD) WITH PROPOFOL N/A 12/19/2016  ? Procedure: ESOPHAGOGASTRODUODENOSCOPY (EGD) WITH PROPOFOL;  Surgeon: West Bali, MD;  [facility];  Service: Endoscopy;  Laterality: N/A;  11:30am  ? FLEXIBLE SIGMOIDOSCOPY N/A 12/10/2015  ? hemorrhoid banding X 3   ? HEMORRHOID BANDING N/A 12/10/2015  ? Procedure: HEMORRHOID BANDING;  Surgeon: West Bali, MD;  [facility];  Service: Endoscopy;  Laterality: N/A;  1:30 PM  ? INSERTION OF SUPRAPUBIC CATHETER N/A 02/24/2019  ? Procedure: INSERTION OF SUPRAPUBIC CATHETER;  Surgeon: Ihor Gully, MD;  [facility];  Service: Urology;  Laterality: N/A;  ? IR CM INJ ANY COLONIC TUBE W/FLUORO  10/30/2018  ? IR GASTROSTOMY TUBE MOD SED  10/04/2018  ? IR GASTROSTOMY TUBE REMOVAL  10/08/2019  ? IR IMAGING GUIDED PORT INSERTION  10/04/2018  ? IR REMOVAL TUN ACCESS W/ PORT W/O FL MOD SED  10/14/2018  ? IR REPLACE G-TUBE SIMPLE WO FLUORO  11/03/2018  ? IR REPLACE G-TUBE SIMPLE WO FLUORO  07/28/2019  ?  LAPAROSCOPIC INSERTION GASTROSTOMY TUBE Left 10/07/2018  ? Procedure: LAPAROSCOPIC  GASTROSTOMY TUBE;  Surgeon: Kinsinger, De Blanch, MD;  [facility];  Service: General;  Laterality: Left;  ? MICROLARYNGOSCOPY N/A 09/27/2018  ? Procedure: MICRO DIRECT LARYNGOSCOPY WITH BIOPSY;  Surgeon: Newman Pies, MD;  [facility];  Service: ENT;  Laterality: N/A;  ? None to date    ? As of 04/14/15  ? POLYPECTOMY  05/04/2015  ? Procedure: POLYPECTOMY;  Surgeon: West Bali, MD;  [facility];  Service: Endoscopy;;  descending colon polyp, ascending colon  polyp  ? SAVORY DILATION N/A 12/19/2016  ? Procedure: SAVORY DILATION;  Surgeon: West Bali, MD;  [facility];  Service: Endoscopy;  Laterality: N/A;  ? TRACHEOSTOMY TUBE PLACEMENT N/A 09/27/2018  ? Procedure: AWAKE TRACHEOSTOMY;  Surgeon: Newman Pies, MD;  [facility];  Service: ENT;  Laterality: N/A;  ? TRANSURETHRAL RESECTION OF PROSTATE N/A 02/24/2019  ? Procedure: TRANSURETHRAL RESECTION OF THE PROSTATE (TURP);  Surgeon: Ihor Gully, MD;  [facility];  Service: Urology;  Laterality: N/A;  ? VIDEO BRONCHOSCOPY WITH ENDOBRONCHIAL NAVIGATION N/A 04/29/2019  ? Procedure: VIDEO BRONCHOSCOPY;  Surgeon: Josephine Igo, DO;  [facility];  Service: Pulmonary;  Laterality: N/A;  ? ?Patient Active Problem List  ? Diagnosis Date Noted  ? Brain metastases 06/20/2021  ? Bone metastasis 06/08/2021  ? Neck mass 02/15/2021  ? Low back pain 03/10/2020  ? Malignant neoplasm of right upper lobe of lung (HCC) 05/27/2019  ? Primary cancer of left upper lobe of lung (HCC) 05/27/2019  ? Tracheobronchitis 05/21/2019  ? Hx of radiation therapy 05/21/2019  ? 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 Adenocarcinoma, lung, left (HCC) 05/21/2019  ? 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 Pleural effusion   ? 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HCC)   ? Normocytic anemia   ? PSVT (paroxysmal supraventricular tachycardia) (HCC) 10/03/2018  ? Atrial fibrillation (HCC) 10/03/2018  ? Dysphagia   ? Loss of weight   ? 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 Gastritis determined by endoscopy   ? Esophageal dysphagia   ? False positive serological test for hepatitis C 12/13/2016  ? Globus sensation 12/12/2016  ? Smokes with greater than 40 pack year history 10/20/2016  ? Hemorrhoids, internal, with bleeding 10/06/2015  ? History of colonic polyps 04/14/2015  ? Vitamin D deficiency 10/09/2014  ? HLD (hyperlipidemia) 10/08/2012  ? GERD (gastroesophageal reflux  disease) 10/08/2012  ? HTN (hypertension)   ? Diverticulitis 10/25/2009  ? ? ?PCP: [name], FNP ? ?REFERRING PROVIDER: [name], FNP ? ?REFERRING DIAG: R53.1 (ICD-10-CM) - Weakness  ? ?THERAPY DIAG:  ?Muscle weakness (generalized) ? ?Other abnormalities of gait and mobility ? ?ONSET DATE: 3/23 ? ?SUBJECTIVE:  ? ?SUBJECTIVE STATEMENT: ?Patient states leg weakness and balance problems. Left leg keeps giving out. They found 5 tumors in the brain. Mets to middle/upper back. Balance has been getting worse.  ? ?PERTINENT HISTORY: ?Cancer with mets, chronic back pain ? ?PAIN:  ?Are you having pain? Yes: NPRS scale: 6/10 ?Pain location: lower back ?Pain description: sore ?Aggravating factors: standing ?Relieving factors: sitting/rest ? ?PRECAUTIONS: Fall ? ?WEIGHT BEARING RESTRICTIONS No ? ?FALLS:  ?Has patient fallen in last 6 months? Yes. Number of falls 1 ? ?LIVING ENVIRONMENT: ?Lives with: lives with their family and lives with their spouse ?Lives in: House/apartment ?Stairs: Yes: External: 6 steps; on left  going up ?Has following equipment at home: Dan Humphreys - 2 wheeled ? ?OCCUPATION: Retired ? ?PLOF: Independent ? ?PATIENT GOALS improve balance and walking ? ? ?OBJECTIVE:  ? ?DIAGNOSTIC FINDINGS:  ?PET scan 4/13 IMPRESSION: ?1. New destructive T5 vertebral body lesion is hypermetabolic and ?consistent with metastatic disease. Spinal canal involvement is ?noted. Recommend MRI thoracic spine without and with contrast for ?further evaluation and consideration for radiation treatment. ?2. Stable post treatment changes involving both lungs. No new or ?progressive findings. Stable bilateral pulmonary nodules. ?3. Stable small mildly hypermetabolic right hilar node. ?4. No findings for abdominal/pelvic metastatic disease. ? ?MRI 06/15/21 IMPRESSION: ?Positive for 5 brain metastases, the largest measuring 2.1 cm in the ?right cerebellum where there is vasogenic edema with local mass ?effect. No  hydrocephalus. ? ?Observation: ?Ambulates with RW, crouched posture, unsteady, trunk flexed ? ?COGNITION: ? Overall cognitive status: Within functional limits for tasks assessed   ?  ?SENSATION: ?WFL ? ? ?POSTURE:  ?Trunk

## 2021-07-18 NOTE — Patient Instructions (Signed)
Access Code: [facility] ?URL: [email or web address] ?Date: 07/18/2021 ?Prepared by: Mitzi Hansen Karli Wickizer ? ?Exercises ?- Seated March  - 5 x daily - 7 x weekly - 2 sets - 10 reps - 5 second hold ?- Seated Long Arc Quad  - 5 x daily - 7 x weekly - 2 sets - 10 reps - 5 second hold ?- Seated Heel Toe Raises  - 5 x daily - 7 x weekly - 2 sets - 10 reps - 5 second hold ?

## 2021-07-20 ENCOUNTER — Telehealth: Payer: Self-pay | Admitting: Family

## 2021-07-20 NOTE — Telephone Encounter (Signed)
Patient's wife calling because he has not been able to go to the bathroom today. Would like advice on what he can do. Please call back.  ?

## 2021-07-20 NOTE — Telephone Encounter (Signed)
Discussed with wife okay to do a gentle stool softner. Advised to try coffee, warm applejuice and to increase fiber in diet. Wife verbalized understanding.  ?

## 2021-07-27 ENCOUNTER — Other Ambulatory Visit: Payer: Self-pay | Admitting: Radiation Therapy

## 2021-07-27 ENCOUNTER — Inpatient Hospital Stay: Payer: Medicare Other | Attending: Radiation Oncology

## 2021-07-27 ENCOUNTER — Other Ambulatory Visit: Payer: Self-pay | Admitting: Radiation Oncology

## 2021-07-27 ENCOUNTER — Ambulatory Visit (INDEPENDENT_AMBULATORY_CARE_PROVIDER_SITE_OTHER): Payer: Medicare Other | Admitting: Licensed Clinical Social Worker

## 2021-07-27 ENCOUNTER — Ambulatory Visit
Admission: RE | Admit: 2021-07-27 | Discharge: 2021-07-27 | Disposition: A | Discharge status: Home / Self Care | Payer: Medicare Other | Source: Ambulatory Visit | Attending: Radiation Oncology | Admitting: Radiation Oncology

## 2021-07-27 ENCOUNTER — Other Ambulatory Visit: Payer: Self-pay

## 2021-07-27 DIAGNOSIS — E559 Vitamin D deficiency, unspecified: Secondary | ICD-10-CM

## 2021-07-27 DIAGNOSIS — Z923 Personal history of irradiation: Secondary | ICD-10-CM | POA: Insufficient documentation

## 2021-07-27 DIAGNOSIS — Z7952 Long term (current) use of systemic steroids: Secondary | ICD-10-CM | POA: Insufficient documentation

## 2021-07-27 DIAGNOSIS — C3492 Malignant neoplasm of unspecified part of left bronchus or lung: Secondary | ICD-10-CM

## 2021-07-27 DIAGNOSIS — I10 Essential (primary) hypertension: Secondary | ICD-10-CM

## 2021-07-27 DIAGNOSIS — C7951 Secondary malignant neoplasm of bone: Secondary | ICD-10-CM | POA: Insufficient documentation

## 2021-07-27 DIAGNOSIS — C3411 Malignant neoplasm of upper lobe, right bronchus or lung: Secondary | ICD-10-CM | POA: Diagnosis not present

## 2021-07-27 DIAGNOSIS — K219 Gastro-esophageal reflux disease without esophagitis: Secondary | ICD-10-CM

## 2021-07-27 DIAGNOSIS — Z87891 Personal history of nicotine dependence: Secondary | ICD-10-CM | POA: Insufficient documentation

## 2021-07-27 DIAGNOSIS — I4891 Unspecified atrial fibrillation: Secondary | ICD-10-CM

## 2021-07-27 DIAGNOSIS — E785 Hyperlipidemia, unspecified: Secondary | ICD-10-CM

## 2021-07-27 DIAGNOSIS — C7931 Secondary malignant neoplasm of brain: Secondary | ICD-10-CM

## 2021-07-27 DIAGNOSIS — G893 Neoplasm related pain (acute) (chronic): Secondary | ICD-10-CM | POA: Insufficient documentation

## 2021-07-27 DIAGNOSIS — Z79899 Other long term (current) drug therapy: Secondary | ICD-10-CM | POA: Insufficient documentation

## 2021-07-27 DIAGNOSIS — C3412 Malignant neoplasm of upper lobe, left bronchus or lung: Secondary | ICD-10-CM | POA: Insufficient documentation

## 2021-07-27 MED ORDER — DEXAMETHASONE 4 MG PO TABS: ORAL_TABLET | ORAL | 0 refills | Status: DC

## 2021-07-27 NOTE — Progress Notes (Signed)
Logan Price presents today for follow-up after completing single SRS treatment  on 06/24/2021 ? ?Dose of Decadron, if applicable: Not currently prescribed ? ?Recent neurologic symptoms, if any:  ?Seizures: None ?Headaches: Report intermittent headaches to the front and left side of his head. States nothing seems to trigger them or alleviate them ?Nausea: Patient denies any reports a healthy appetite ?Dizziness/ataxia: Denies dizziness, but does feel off balance when up ambulating. Presents to clinic in a wheelchair and uses a walker around the house ?Difficulty with hand coordination: Patient denies ?Focal numbness/weakness: Reports weakness to bilateral legs. Reports he fell last week while trying to get out of bed; denies hitting his head but did feel that his legs gave out when he tried to bear weight ?Visual deficits/changes: Patient denies ?Confusion/Memory deficits: Patient denies ? ?Additional Complaints / other details: Nothing else of note ? ?

## 2021-07-27 NOTE — Patient Instructions (Addendum)
Visit Information ? ?Patient goals:  Protect My Health (Patient). Patient will work to improve mobility, manage pain issues and complete ADLs daily ? ?Timeframe:  Short-Term Goal ?Priority:  Medium ?Progress: On Track ?Start Date:     03/17/21                        ?Expected End Date:    10/18/21                ? ?Follow Up Date   09/07/21 at 3:00 PM ?  ?Protect My Health (Patient) Patient will work to improve mobility, manage pain issues and complete ADLs daily  ? ?Why is this important?   ?Screening tests can find diseases early when they are easier to treat.  ?Your doctor or nurse will talk with you about which tests are important for you.  ?Getting shots for common diseases like the flu and shingles will help prevent them.   ?  ?Patient Strengths: ? ?Attends scheduled medical appointments ?Takes medications as prescribed ?Has support from spouse ?Drives to needed appointments ? ?Patient Deficits:  ?Pain issues and mobility issues ? ?Patient Goals:  ? ?To attend scheduled medical appointments ?To take medications as prescribed ?To communicate regularly with his spouse to discuss client needs ?To communicate with RNCM or LCSW as needed for CCM support ?To participate in physical therapy sessions as scheduled ?-  ?Follow Up Plan: LCSW to call client or spouse of client on 09/07/21 at 3:00 PM  ? ?Norva Riffle.Trent Theisen MSW, LCSW ?Licensed Clinical Social Worker ?[facility] [employer] ?[phone] ?

## 2021-07-27 NOTE — Chronic Care Management (AMB) (Signed)
?Chronic Care Management  ? ? Clinical Social Work Note ? ?07/27/2021 ?Name: Logan Price MRN: [id number] DOB: 08/12/1945 ? ?Logan Price is a 76 y.o. year old male who is a primary care patient of [name], FNP. The CCM team was consulted to assist the patient with chronic disease management and/or care coordination needs related to: [name] .  ? ?Engaged with patient by telephone for follow up visit in response to provider referral for social work chronic care management and care coordination services.  ? ?Consent to Services:  ?The patient was given information about Chronic Care Management services, agreed to services, and gave verbal consent prior to initiation of services.  Please see initial visit note for detailed documentation.  ? ?Patient agreed to services and consent obtained.  ? ?Assessment: Review of patient past medical history, allergies, medications, and health status, including review of relevant consultants reports was performed today as part of a comprehensive evaluation and provision of chronic care management and care coordination services.    ? ?SDOH (Social Determinants of Health) assessments and interventions performed:  ?SDOH Interventions   ? ?Flowsheet Row Most Recent Value  ?SDOH Interventions   ?Physical Activity Interventions Other (Comments)  [walking challenges. he uses a walker to help him walk]  ?Stress Interventions Provide Counseling  [client has stress related to managing medical needs]  ?Depression Interventions/Treatment  Counseling  ? ?  ?  ? ?Advanced Directives Status: See Vyn[employer] application for related entries. ? ?CCM Care Plan ? ?No Known Allergies ? ?Outpatient Encounter Medications as of 07/27/2021  ?Medication Sig  ? benzonatate (TESSALON PERLES) 100 MG capsule Take 1 capsule (100 mg total) by mouth 3 (three) times daily as needed.  ? doxazosin (CARDURA) 1 MG tablet Place 1 tablet (1 mg total) into feeding tube daily. (Patient taking differently: Take 1  mg by mouth daily.)  ? ondansetron (ZOFRAN) 4 MG tablet Take 1 tablet (4 mg total) by mouth every 8 (eight) hours as needed for nausea or vomiting.  ? pantoprazole (PROTONIX) 40 MG tablet TAKE ONE TABLET DAILY 30 MINUTES PRIOR TO THE FIRST MEAL  ? simvastatin (ZOCOR) 20 MG tablet TAKE 1 TABLET DAILY AT 6PM  ? ?No facility-administered encounter medications on file as of 07/27/2021.  ? ? ?Patient Active Problem List  ? Diagnosis Date Noted  ? Brain metastases 06/20/2021  ? Bone metastasis 06/08/2021  ? Neck mass 02/15/2021  ? Low back pain 03/10/2020  ? Malignant neoplasm of right upper lobe of lung (HCC) 05/27/2019  ? Primary cancer of left upper lobe of lung (HCC) 05/27/2019  ? Tracheobronchitis 05/21/2019  ? Hx of radiation therapy 05/21/2019  ? 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 Adenocarcinoma, lung, left (HCC) 05/21/2019  ? 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 Pleural effusion   ? 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HCC)   ? Normocytic anemia   ? PSVT (paroxysmal supraventricular tachycardia) (HCC) 10/03/2018  ? Atrial fibrillation (HCC) 10/03/2018  ? Dysphagia   ? Loss of weight   ? 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 Gastritis determined by endoscopy   ? Esophageal dysphagia   ? False positive serological test for hepatitis C 12/13/2016  ? Globus sensation 12/12/2016  ? Smokes with greater than 40 pack year history 10/20/2016  ? Hemorrhoids, internal, with bleeding 10/06/2015  ? History of colonic polyps 04/14/2015  ? Vitamin D deficiency 10/09/2014  ? HLD (hyperlipidemia) 10/08/2012  ?  GERD (gastroesophageal reflux disease) 10/08/2012  ? HTN (hypertension)   ? Diverticulitis 10/25/2009  ? ? ?Conditions to be addressed/monitored: monitor client completion of daily ADLs ? ?Care Plan : LCSW care plan  ?Updates made by Isaiah Blakes, LCSW since 07/27/2021 12:00 AM  ?  ? ?Problem: Coping Skills (General Plan of Care)   ?  ? ?Goal: Manage  mobility issues, manage pain issues and work to complete ADLs daily   ?Start Date: 03/17/2021  ?Expected End Date: 10/18/2021  ?This Visit's Progress: On track  ?Recent Progress: On track  ?Priority: Medium  ?Note:   ?Current barriers:   ?Patient in need of assistance with connecting to community resources for helping client find needed resources to help him with managing mobility and ADLs completion ?Patient is unable to independently navigate community resource options without care coordination support ?Adenocarcinoma of Left Lung ?Pain issues ?Mobility issues ? ?Clinical Goals:  ?Patient will communicate with LCSW in next 30 days to discuss community resources of possible help to client ?Patient to communicate with LCSW in next 30 days to discuss client mobility and pain issues of client ?Patient to communicate with RNCM  in next 30 days as needed to discuss nursing needs of client ? ?Clinical Interventions:  ?Collaboration with [name], FNP regarding development and update of comprehensive plan of care as evidenced by provider attestation and co-signature ?Discussed pain issues of client (client has pain in his back) ?Reviewed appetite of client and reviewed sleeping issues of client.  ?Encouraged client to call RNCM as needed for nursing support   ?Provided counseling support for client ?Discussed client cancer treatments. He said, at this time, he has completed his cancer treatments. He has follow up appointment today in [place], [place] ?Discussed family support. Client has support from his wife and from his brother ?Reviewed relaxation techniques of client. He said he likes watching TV to help him relax ?Reviewed ambulation  of client. He uses a walker to help him walk. He said he has started physical therapy. He will receive physical therapy sessions 2 times weekly. He is looking forward to these physical therapy sessions ? ?Patient Strengths: ? ?Attends scheduled medical appointments ?Takes  medications as prescribed ?Has support from spouse ?Drives to needed appointments ? ?Patient Deficits:  ?Pain issues and mobility issues ? ?Patient Goals:  ? ?To attend scheduled medical appointments ?To take medications as prescribed ?To communicate regularly with his spouse to discuss client needs ?To communicate with RNCM or LCSW as needed for CCM support ?  ?Follow Up Plan: LCSW to call client or spouse of client on 09/07/21 at 3:00 PM   ?  ? Kelton Pillar.Daran Favaro MSW, LCSW ?Licensed Clinical Social Worker ?[facility] Care Management ?[phone] ?

## 2021-07-28 ENCOUNTER — Encounter (HOSPITAL_COMMUNITY): Payer: Self-pay

## 2021-07-28 ENCOUNTER — Ambulatory Visit (HOSPITAL_COMMUNITY): Payer: Medicare Other | Attending: Family

## 2021-07-28 DIAGNOSIS — R2689 Other abnormalities of gait and mobility: Secondary | ICD-10-CM | POA: Diagnosis not present

## 2021-07-28 DIAGNOSIS — G8929 Other chronic pain: Secondary | ICD-10-CM | POA: Diagnosis not present

## 2021-07-28 DIAGNOSIS — M545 Low back pain, unspecified: Secondary | ICD-10-CM | POA: Insufficient documentation

## 2021-07-28 DIAGNOSIS — M6281 Muscle weakness (generalized): Secondary | ICD-10-CM | POA: Insufficient documentation

## 2021-07-28 NOTE — Therapy (Signed)
? ?OUTPATIENT PHYSICAL THERAPY TREATMENT NOTE ? ? ?Patient Name: Logan Price ?MRN: [id number] ?DOB:1945-10-24, 76 y.o., male ?Today's Date: 07/28/2021 ? ?PCP: [name], FNP ?REFERRING PROVIDER: [name], FNP ? ?END OF SESSION:  ? PT End of Session - 07/28/21 1721   ? ? Visit Number 2   ? Number of Visits 12   ? Date for PT Re-Evaluation 08/29/21   ? Authorization Type Primary: Medicare Secondary BCBS (for hospital only)( no coverage for PT)   ? Progress Note Due on Visit 10   ? PT Start Time 1712   late sign in  ? PT Stop Time 1745   ? PT Time Calculation (min) 33 min   ? Activity Tolerance Patient tolerated treatment well;Patient limited by pain;No increased pain;Patient limited by fatigue   ? Behavior During Therapy [facility] for tasks assessed/performed   ? ?  ?  ? ?  ? ? ?Past Medical History:  ?Diagnosis Date  ? Diverticulitis   ? Dysrhythmia 09/2018  ? episode of SVT while in hospital  ? False positive serological test for hepatitis C 12/13/2016  ? GERD (gastroesophageal reflux disease)   ? 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  ? trach  ? HTN (hypertension)   ? Hyperlipidemia   ? ?Past Surgical History:  ?Procedure Laterality Date  ? BIOPSY  05/04/2015  ? Procedure: BIOPSY;  Surgeon: West Bali, MD;  [facility];  Service: Endoscopy;;  bx's of ileocecal valve ?  ? BIOPSY  06/22/2020  ? Procedure: BIOPSY;  Surgeon: Lanelle Bal, DO;  [facility];  Service: Endoscopy;;  ? BRONCHIAL BRUSHINGS  04/29/2019  ? Procedure: BRONCHIAL BRUSHINGS;  Surgeon: Josephine Igo, DO;  [facility];  Service: Pulmonary;;  ? BRONCHIAL NEEDLE ASPIRATION BIOPSY  04/29/2019  ? Procedure: BRONCHIAL NEEDLE ASPIRATION BIOPSIES;  Surgeon: Josephine Igo, DO;  [facility];  Service: Pulmonary;;  ? BRONCHIAL WASHINGS  04/29/2019  ? Procedure: BRONCHIAL WASHINGS;  Surgeon: Josephine Igo, DO;  [facility];  Service: Pulmonary;;  ? COLONOSCOPY WITH PROPOFOL N/A 05/04/2015  ? Dr. Darrick Penna:  normal appearing ileum with prominent IC valve with tubular adenomas, moderate diverticulosis in sigmoid colon, ascending colon, and retum. Moderate sized internal hemorrhoids. Surveillance in 5 years  ? COLONOSCOPY WITH PROPOFOL N/A 06/22/2020  ? Procedure: COLONOSCOPY WITH PROPOFOL;  Surgeon: Lanelle Bal, DO;  [facility];  Service: Endoscopy;  Laterality: N/A;  PM  ? ELECTROMAGNETIC NAVIGATION BROCHOSCOPY  04/29/2019  ? Procedure: NAVIGATION BRONCHOSCOPY;  Surgeon: Josephine Igo, DO;  [facility];  Service: Pulmonary;;  ? ESOPHAGOGASTRODUODENOSCOPY (EGD) WITH PROPOFOL N/A 12/19/2016  ? Procedure: ESOPHAGOGASTRODUODENOSCOPY (EGD) WITH PROPOFOL;  Surgeon: West Bali, MD;  [facility];  Service: Endoscopy;  Laterality: N/A;  11:30am  ? FLEXIBLE SIGMOIDOSCOPY N/A 12/10/2015  ? hemorrhoid banding X 3   ? HEMORRHOID BANDING N/A 12/10/2015  ? Procedure: HEMORRHOID BANDING;  Surgeon: West Bali, MD;  [facility];  Service: Endoscopy;  Laterality: N/A;  1:30 PM  ? INSERTION OF SUPRAPUBIC CATHETER N/A 02/24/2019  ? Procedure: INSERTION OF SUPRAPUBIC CATHETER;  Surgeon: Ihor Gully, MD;  [facility]: [facility];  Service: Urology;  Laterality: N/A;  ? IR CM INJ ANY COLONIC TUBE W/FLUORO  10/30/2018  ? IR GASTROSTOMY TUBE MOD SED  10/04/2018  ? IR GASTROSTOMY TUBE REMOVAL  10/08/2019  ? IR IMAGING GUIDED PORT INSERTION  10/04/2018  ? IR REMOVAL TUN ACCESS  W/ PORT W/O FL MOD SED  10/14/2018  ? IR REPLACE G-TUBE SIMPLE WO FLUORO  11/03/2018  ? IR REPLACE G-TUBE SIMPLE WO FLUORO  07/28/2019  ? LAPAROSCOPIC INSERTION GASTROSTOMY TUBE Left 10/07/2018  ? Procedure: LAPAROSCOPIC  GASTROSTOMY TUBE;  Surgeon: Kinsinger, De Blanch, MD;  [facility];  Service: General;  Laterality: Left;  ? MICROLARYNGOSCOPY N/A 09/27/2018  ? Procedure: MICRO DIRECT LARYNGOSCOPY WITH BIOPSY;  Surgeon: Newman Pies, MD;  [facility];  Service: ENT;  Laterality: N/A;  ? None to date    ? As of 04/14/15  ?  POLYPECTOMY  05/04/2015  ? Procedure: POLYPECTOMY;  Surgeon: West Bali, MD;  [facility];  Service: Endoscopy;;  descending colon polyp, ascending colon polyp  ? SAVORY DILATION N/A 12/19/2016  ? Procedure: SAVORY DILATION;  Surgeon: West Bali, MD;  [facility];  Service: Endoscopy;  Laterality: N/A;  ? TRACHEOSTOMY TUBE PLACEMENT N/A 09/27/2018  ? Procedure: AWAKE TRACHEOSTOMY;  Surgeon: Newman Pies, MD;  [facility];  Service: ENT;  Laterality: N/A;  ? TRANSURETHRAL RESECTION OF PROSTATE N/A 02/24/2019  ? Procedure: TRANSURETHRAL RESECTION OF THE PROSTATE (TURP);  Surgeon: Ihor Gully, MD;  [facility]: [facility];  Service: Urology;  Laterality: N/A;  ? VIDEO BRONCHOSCOPY WITH ENDOBRONCHIAL NAVIGATION N/A 04/29/2019  ? Procedure: VIDEO BRONCHOSCOPY;  Surgeon: Josephine Igo, DO;  [facility];  Service: Pulmonary;  Laterality: N/A;  ? ?Patient Active Problem List  ? Diagnosis Date Noted  ? Brain metastases 06/20/2021  ? Bone metastasis 06/08/2021  ? Neck mass 02/15/2021  ? Low back pain 03/10/2020  ? Malignant neoplasm of right upper lobe of lung (HCC) 05/27/2019  ? Primary cancer of left upper lobe of lung (HCC) 05/27/2019  ? Tracheobronchitis 05/21/2019  ? Hx of radiation therapy 05/21/2019  ? 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 Adenocarcinoma, lung, left (HCC) 05/21/2019  ? 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 Pleural effusion   ? 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HCC)   ? Normocytic anemia   ? PSVT (paroxysmal supraventricular tachycardia) (HCC) 10/03/2018  ? Atrial fibrillation (HCC) 10/03/2018  ? Dysphagia   ? Loss of weight   ? 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 Gastritis determined by endoscopy   ? Esophageal dysphagia   ? False positive serological test for hepatitis C 12/13/2016  ? Globus sensation 12/12/2016  ? Smokes with greater than 40 pack year history 10/20/2016  ? Hemorrhoids, internal,  with bleeding 10/06/2015  ? History of colonic polyps 04/14/2015  ? Vitamin D deficiency 10/09/2014  ? HLD (hyperlipidemia) 10/08/2012  ? GERD (gastroesophageal reflux disease) 10/08/2012  ? HTN (hypertension)   ? Diverticulitis 10/25/2009  ? ? ?REFERRING DIAG: R53.1 (ICD-10-CM) - Weakness  ? ?THERAPY DIAG:  ?Muscle weakness (generalized) ? ?Other abnormalities of gait and mobility ? ?Chronic bilateral low back pain, unspecified whether sciatica present ? ?PERTINENT HISTORY:  ? ?PRECAUTIONS: Muscle weakness (generalized) ?  ?Other abnormalities of gait and mobility ? ?SUBJECTIVE: Pt reports he is taking Steroids now for back pain.  Has MRI scheduled for back on 08/05/2021.  Reports he has began the HEP daily without questions.   ? ?PAIN:  ?Are you having pain? Yes: NPRS scale: 8/10 ?Pain location: LBP ?Pain description: sore ?Aggravating factors: walking ?Relieving factors: sitting calms down. ?Onset date: 3/23 ? ?OBJECTIVE: (objective measures completed at initial evaluation unless otherwise dated) ? ? ?OBJECTIVE:  ?  ?  DIAGNOSTIC FINDINGS:  ?PET scan 4/13 IMPRESSION: ?1. New destructive T5 vertebral body lesion is hypermetabolic and ?consistent with metastatic disease. Spinal canal involvement is ?noted. Recommend MRI thoracic spine without and with contrast for ?further evaluation and consideration for radiation treatment. ?2. Stable post treatment changes involving both lungs. No new or ?progressive findings. Stable bilateral pulmonary nodules. ?3. Stable small mildly hypermetabolic right hilar node. ?4. No findings for abdominal/pelvic metastatic disease. ?  ?MRI 06/15/21 IMPRESSION: ?Positive for 5 brain metastases, the largest measuring 2.1 cm in the ?right cerebellum where there is vasogenic edema with local mass ?effect. No hydrocephalus. ?  ?Observation: ?Ambulates with RW, crouched posture, unsteady, trunk flexed ?  ?COGNITION: ?          Overall cognitive status: Within functional limits for tasks assessed                ?           ?SENSATION: ?WFL ?  ?  ?POSTURE:  ?Trunk flexed ?  ?LE ROM: decreased hip extension bilaterally ?  ?Active ROM Right ?07/18/2021 Left ?07/18/2021 Left ?07/28/21 Right ?07/28/21  ?Hip flexio

## 2021-07-29 ENCOUNTER — Encounter: Payer: Self-pay | Admitting: Radiation Oncology

## 2021-07-29 NOTE — Progress Notes (Signed)
?Radiation Oncology         [phone] ?________________________________ ? ?Name: CLEDITH SEIN MRN: [id number]  ?Date: 07/27/2021  DOB: Aug 15, 1945 ? ?Follow-Up Visit Note in person ? ?CC: Hawks, Edilia Bo, FNP  [name], FNP ? ?Diagnosis and Prior Radiotherapy:     ?  ICD-10-CM   ?1. Metastasis to brain (HCC)  C79.31 dexamethasone (DECADRON) 4 MG tablet  ?  ?2. Malignant neoplasm metastatic to brain [facility])  C79.31   ?  ?3. Malignant neoplasm metastatic to bone [facility]  C79.51   ?  ?  ? ? Cancer Staging  ?Squamous cell carcinoma of glottis (HCC) ?Staging form: Larynx - Glottis, AJCC 8th Edition ?- Clinical stage from 10/09/2018: Stage IVB (cT4b, cN3b, cM0) - Signed by Lonie Peak, MD on 10/09/2018 ?Now with METASTATIC DISEASE TO LUNG, BONE + BRAIN ? ? ?Radiation Treatment Dates: 10/22/2018 through 12/10/2018 ?Site Technique Total Dose Dose per Fx Completed Fx Beam Energies  ?Head & neck: HN_larynx IMRT 70/70 2 35/35 6X  ? ?Radiation Treatment Dates: 06/03/2019 through 06/13/2019 ?Site Technique Total Dose (Gy) Dose per Fx (Gy) Completed Fx Beam Energies  ?Lung, Right: Lung_Rt_Upper IMRT 60/60 12 5/5 6XFFF  ?Lung, Left: Lung_Lt_Upper IMRT 60/60 12 5/5 6XFFF  ? ?Radiation Treatment Dates: 01/18/2021 through 01/28/2021 ?Site Technique Total Dose (Gy) Dose per Fx (Gy) Completed Fx Beam Energies  ?Lung, Right: Lung_Rt_Upper IMRT 60/60 12 5/5 6XFFF  ? ?Radiation Treatment Dates: 06/13/2021 through 06/24/2021 ?Site Technique Total Dose (Gy) Dose per Fx (Gy) Completed Fx Beam Energies  ?Brain: Brain_SRS IMRT 18/18 18 1/1 6XFFF  ?Thoracic Spine: Spine_T4-T6 3D 30/30 3 10/10 10X  ? ?CHIEF COMPLAINT:  Here for follow-up and surveillance of cancer ? ? ?Narrative:     Mr. Pier presents today for follow-up after completing single SRS treatment  on 06/24/2021 ? ?Dose of Decadron, if applicable: Not currently prescribed ? ?Recent neurologic symptoms, if any:  ?Seizures: None ?Headaches: Report intermittent headaches to the front and  left side of his head. States nothing seems to trigger them or alleviate them ?Nausea: Patient denies any reports a healthy appetite ?Dizziness/ataxia: Denies dizziness, but does feel off balance when up ambulating. Presents to clinic in a wheelchair and uses a walker around the house ?Difficulty with hand coordination: Patient denies ?Focal numbness/weakness: Reports weakness to bilateral legs. Reports he fell last week while trying to get out of bed; denies hitting his head but did feel that his legs gave out when he tried to bear weight ?Visual deficits/changes: Patient denies ?Confusion/Memory deficits: Patient denies ? ?Additional Complaints / other details: Nothing else of note ? ?He denies any new back pain.  He has chronic pain in his lower lumbar/ upper sacral spine. ?No pain in the thoracic spine where he previously received radiation in March 2022 ? ?ALLERGIES:  has No Known Allergies. ? ?Meds: ?Current Outpatient Medications  ?Medication Sig Dispense Refill  ? acetaminophen (TYLENOL) 650 MG CR tablet Take 650 mg by mouth every 8 (eight) hours as needed for pain.    ? dexamethasone (DECADRON) 4 MG tablet Take 2 tablets on first dose with food. Then, take 1 tablet BID with breakfast and dinner. 30 tablet 0  ? benzonatate (TESSALON PERLES) 100 MG capsule Take 1 capsule (100 mg total) by mouth 3 (three) times daily as needed. 20 capsule 0  ? doxazosin (CARDURA) 1 MG tablet Place 1 tablet (1 mg total) into feeding tube daily. (Patient taking differently: Take 1 mg by mouth daily.)  30 tablet 5  ? ondansetron (ZOFRAN) 4 MG tablet Take 1 tablet (4 mg total) by mouth every 8 (eight) hours as needed for nausea or vomiting. 20 tablet 0  ? pantoprazole (PROTONIX) 40 MG tablet TAKE ONE TABLET DAILY 30 MINUTES PRIOR TO THE FIRST MEAL 90 tablet 1  ? simvastatin (ZOCOR) 20 MG tablet TAKE 1 TABLET DAILY AT 6PM 90 tablet 1  ? ?No current facility-administered medications for this encounter.  ? ? ?Physical Findings: ?The  patient is in no acute distress. Patient is alert and oriented. ?Wt Readings from Last 3 Encounters:  ?07/14/21 160 lb 8 oz (72.8 kg)  ?06/23/21 163 lb 1.6 oz (74 kg)  ?06/07/21 165 lb 3.2 oz (74.9 kg)  ? ? vitals were not taken for this visit. Marland Kitchen  ?General: Alert and oriented, in no acute distress.   ?NECK: No obvious palpable masses in the cervical or supraclavicular neck; healed from right neck dissection. ?HEENT: no lesions.  No thrush. ?Extremities without any edema ?Psychiatric: Affect appropriate, judgment and insight intact ?Neuro: non focal, no nystagmus, coordination is intact per finger-to-nose testing and rapidly alternating movements. ?MSK: strength is symmetric; nontender to palpation at T5; decreased bilateral strength per hip flexion.  Presents in a wheelchair. ? ?Lab Findings: ?Lab Results  ?Component Value Date  ? WBC 4.4 10/07/2020  ? HGB 12.9 (L) 10/07/2020  ? HCT 39.5 10/07/2020  ? MCV 91 10/07/2020  ? PLT 185 10/07/2020  ? ? ?Lab Results  ?Component Value Date  ? TSH 3.886 03/04/2021  ? ? ?Radiographic Findings:   ?NM PET Image Restag (PS) Skull Base To Thigh ? ?Result Date: 07/09/2021 ?CLINICAL DATA:  Subsequent treatment strategy for non-small cell lung cancer. Patient also has a history of head and neck cancer. New metastatic brain lesions and new destructive T5 vertebral body lesion on chest CT. EXAM: NUCLEAR MEDICINE PET SKULL BASE TO THIGH TECHNIQUE: 9.22 mCi F-18 FDG was injected intravenously. Full-ring PET imaging was performed from the skull base to thigh after the radiotracer. CT data was obtained and used for attenuation correction and anatomic localization. Fasting blood glucose: 112 mg/dl COMPARISON:  PET-CT 86/57/8469 FINDINGS: Mediastinal blood pool activity: SUV max 1.33 Liver activity: SUV max NA NECK: No hypermetabolic lymph nodes in the neck. Incidental CT findings: Stable radiation changes. CHEST: Stable post treatment changes involving the right upper lobe and left lower  lobe. No areas of hypermetabolism to suggest recurrent tumor. Small scattered pulmonary nodules are unchanged. Too small to accurately assess PET scan. No hypermetabolism is demonstrated. Small right hilar node has an SUV max of 3.10. This was previously 4.8. No new pulmonary lesions. Incidental CT findings: Stable advanced atherosclerotic calcification involving the aorta and coronary arteries. ABDOMEN/PELVIS: No findings for abdominal/pelvic metastatic disease. Incidental CT findings: Stable advanced atherosclerotic calcification involving the aorta and branch vessels but no aneurysm. Stable right renal cysts. Stable right inguinal hernia containing small bowel loops. Stable colonic diverticulosis. Stable benign-appearing subcutaneous cyst in the left anterior abdominal. SKELETON: New lytic destructive bone lesion involving T5 which appears to have spinal canal involvement. MRI thoracic spine without and with contrast is suggested for further evaluation. No other definite metastatic bone lesions. Areas of diffuse muscle uptake likely benign. Incidental CT findings: none IMPRESSION: 1. New destructive T5 vertebral body lesion is hypermetabolic and consistent with metastatic disease. Spinal canal involvement is noted. Recommend MRI thoracic spine without and with contrast for further evaluation and consideration for radiation treatment. 2. Stable post treatment changes  involving both lungs. No new or progressive findings. Stable bilateral pulmonary nodules. 3. Stable small mildly hypermetabolic right hilar node. 4. No findings for abdominal/pelvic metastatic disease. Electronically Signed   By: Rudie Meyer M.D.   On: 07/09/2021 11:07   ? ? ?Impression/Plan:   ? ?1) Disease status: This is a wonderful patient with h/o locally advanced larygeal cancer. Since RT in 2020 and complete response, he has developed metastatic disease in lung, brain, bone ? ?His bilateral leg weakness could be due to generalized  deconditioning but I would like to rule out severe spinal canal disease that might be responsible for this.  He has pain in the lower spine but unknown lesion in the thoracic spine.  We will go ahead and get a tota

## 2021-08-02 ENCOUNTER — Ambulatory Visit (HOSPITAL_COMMUNITY): Payer: Medicare Other | Admitting: Physical Therapy

## 2021-08-02 DIAGNOSIS — G8929 Other chronic pain: Secondary | ICD-10-CM | POA: Diagnosis not present

## 2021-08-02 DIAGNOSIS — R2689 Other abnormalities of gait and mobility: Secondary | ICD-10-CM | POA: Diagnosis not present

## 2021-08-02 DIAGNOSIS — M545 Low back pain, unspecified: Secondary | ICD-10-CM | POA: Diagnosis not present

## 2021-08-02 DIAGNOSIS — M6281 Muscle weakness (generalized): Secondary | ICD-10-CM | POA: Diagnosis not present

## 2021-08-02 NOTE — Therapy (Signed)
? ?OUTPATIENT PHYSICAL THERAPY TREATMENT NOTE ? ? ?Patient Name: GEN WOLFINGER ?MRN: [id number] ?DOB:08-27-1945, 76 y.o., male ?Today's Date: 08/02/2021 ? ?PCP: [name], FNP ?REFERRING PROVIDER: [name], FNP ? ?END OF SESSION:  ? PT End of Session - 08/02/21 1538   ? ? Visit Number 3   ? Number of Visits 12   ? Date for PT Re-Evaluation 08/29/21   ? Authorization Type Primary: Medicare Secondary BCBS (for hospital only)( no coverage for PT)   ? Progress Note Due on Visit 10   ? PT Start Time 1533   ? PT Stop Time 1615   ? PT Time Calculation (min) 42 min   ? Activity Tolerance Patient tolerated treatment well;Patient limited by pain;No increased pain;Patient limited by fatigue   ? Behavior During Therapy [facility] for tasks assessed/performed   ? ?  ?  ? ?  ? ? ?Past Medical History:  ?Diagnosis Date  ? Diverticulitis   ? Dysrhythmia 09/2018  ? episode of SVT while in hospital  ? False positive serological test for hepatitis C 12/13/2016  ? GERD (gastroesophageal reflux disease)   ? 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  ? trach  ? HTN (hypertension)   ? Hyperlipidemia   ? ?Past Surgical History:  ?Procedure Laterality Date  ? BIOPSY  05/04/2015  ? Procedure: BIOPSY;  Surgeon: West Bali, MD;  [facility];  Service: Endoscopy;;  bx's of ileocecal valve ?  ? BIOPSY  06/22/2020  ? Procedure: BIOPSY;  Surgeon: Lanelle Bal, DO;  [facility];  Service: Endoscopy;;  ? BRONCHIAL BRUSHINGS  04/29/2019  ? Procedure: BRONCHIAL BRUSHINGS;  Surgeon: Josephine Igo, DO;  [facility];  Service: Pulmonary;;  ? BRONCHIAL NEEDLE ASPIRATION BIOPSY  04/29/2019  ? Procedure: BRONCHIAL NEEDLE ASPIRATION BIOPSIES;  Surgeon: Josephine Igo, DO;  [facility];  Service: Pulmonary;;  ? BRONCHIAL WASHINGS  04/29/2019  ? Procedure: BRONCHIAL WASHINGS;  Surgeon: Josephine Igo, DO;  [facility];  Service: Pulmonary;;  ? COLONOSCOPY WITH PROPOFOL N/A 05/04/2015  ? Dr. Darrick Penna: normal appearing  ileum with prominent IC valve with tubular adenomas, moderate diverticulosis in sigmoid colon, ascending colon, and retum. Moderate sized internal hemorrhoids. Surveillance in 5 years  ? COLONOSCOPY WITH PROPOFOL N/A 06/22/2020  ? Procedure: COLONOSCOPY WITH PROPOFOL;  Surgeon: Lanelle Bal, DO;  [facility];  Service: Endoscopy;  Laterality: N/A;  PM  ? ELECTROMAGNETIC NAVIGATION BROCHOSCOPY  04/29/2019  ? Procedure: NAVIGATION BRONCHOSCOPY;  Surgeon: Josephine Igo, DO;  [facility];  Service: Pulmonary;;  ? ESOPHAGOGASTRODUODENOSCOPY (EGD) WITH PROPOFOL N/A 12/19/2016  ? Procedure: ESOPHAGOGASTRODUODENOSCOPY (EGD) WITH PROPOFOL;  Surgeon: West Bali, MD;  [facility];  Service: Endoscopy;  Laterality: N/A;  11:30am  ? FLEXIBLE SIGMOIDOSCOPY N/A 12/10/2015  ? hemorrhoid banding X 3   ? HEMORRHOID BANDING N/A 12/10/2015  ? Procedure: HEMORRHOID BANDING;  Surgeon: West Bali, MD;  [facility];  Service: Endoscopy;  Laterality: N/A;  1:30 PM  ? INSERTION OF SUPRAPUBIC CATHETER N/A 02/24/2019  ? Procedure: INSERTION OF SUPRAPUBIC CATHETER;  Surgeon: Ihor Gully, MD;  [facility];  Service: Urology;  Laterality: N/A;  ? IR CM INJ ANY COLONIC TUBE W/FLUORO  10/30/2018  ? IR GASTROSTOMY TUBE MOD SED  10/04/2018  ? IR GASTROSTOMY TUBE REMOVAL  10/08/2019  ? IR IMAGING GUIDED PORT INSERTION  10/04/2018  ? IR REMOVAL TUN ACCESS W/ PORT W/O FL  MOD SED  10/14/2018  ? IR REPLACE G-TUBE SIMPLE WO FLUORO  11/03/2018  ? IR REPLACE G-TUBE SIMPLE WO FLUORO  07/28/2019  ? LAPAROSCOPIC INSERTION GASTROSTOMY TUBE Left 10/07/2018  ? Procedure: LAPAROSCOPIC  GASTROSTOMY TUBE;  Surgeon: Kinsinger, De Blanch, MD;  [facility];  Service: General;  Laterality: Left;  ? MICROLARYNGOSCOPY N/A 09/27/2018  ? Procedure: MICRO DIRECT LARYNGOSCOPY WITH BIOPSY;  Surgeon: Newman Pies, MD;  [facility];  Service: ENT;  Laterality: N/A;  ? None to date    ? As of 04/14/15  ? POLYPECTOMY  05/04/2015   ? Procedure: POLYPECTOMY;  Surgeon: West Bali, MD;  [facility];  Service: Endoscopy;;  descending colon polyp, ascending colon polyp  ? SAVORY DILATION N/A 12/19/2016  ? Procedure: SAVORY DILATION;  Surgeon: West Bali, MD;  [facility];  Service: Endoscopy;  Laterality: N/A;  ? TRACHEOSTOMY TUBE PLACEMENT N/A 09/27/2018  ? Procedure: AWAKE TRACHEOSTOMY;  Surgeon: Newman Pies, MD;  [facility];  Service: ENT;  Laterality: N/A;  ? TRANSURETHRAL RESECTION OF PROSTATE N/A 02/24/2019  ? Procedure: TRANSURETHRAL RESECTION OF THE PROSTATE (TURP);  Surgeon: Ihor Gully, MD;  [facility];  Service: Urology;  Laterality: N/A;  ? VIDEO BRONCHOSCOPY WITH ENDOBRONCHIAL NAVIGATION N/A 04/29/2019  ? Procedure: VIDEO BRONCHOSCOPY;  Surgeon: Josephine Igo, DO;  [facility];  Service: Pulmonary;  Laterality: N/A;  ? ?Patient Active Problem List  ? Diagnosis Date Noted  ? Malignant neoplasm metastatic to brain [facility]) 06/20/2021  ? Malignant neoplasm metastatic to bone (HCC) 06/08/2021  ? Neck mass 02/15/2021  ? Low back pain 03/10/2020  ? Malignant neoplasm of right upper lobe of lung (HCC) 05/27/2019  ? Primary cancer of left upper lobe of lung (HCC) 05/27/2019  ? Tracheobronchitis 05/21/2019  ? Hx of radiation therapy 05/21/2019  ? 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 Adenocarcinoma, lung, left (HCC) 05/21/2019  ? 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 Pleural effusion   ? 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HCC)   ? Normocytic anemia   ? PSVT (paroxysmal supraventricular tachycardia) (HCC) 10/03/2018  ? Atrial fibrillation (HCC) 10/03/2018  ? Dysphagia   ? Loss of weight   ? 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 Gastritis determined by endoscopy   ? Esophageal dysphagia   ? False positive serological test for hepatitis C 12/13/2016  ? Globus sensation 12/12/2016  ? Smokes with greater than 40 pack year history  10/20/2016  ? Hemorrhoids, internal, with bleeding 10/06/2015  ? History of colonic polyps 04/14/2015  ? Vitamin D deficiency 10/09/2014  ? HLD (hyperlipidemia) 10/08/2012  ? GERD (gastroesophageal reflux disease) 10/08/2012  ? HTN (hypertension)   ? Diverticulitis 10/25/2009  ? ? ?REFERRING DIAG: R53.1 (ICD-10-CM) - Weakness  ? ?THERAPY DIAG:  ?Muscle weakness (generalized) ? ?Other abnormalities of gait and mobility ? ?Chronic bilateral low back pain, unspecified whether sciatica present ? ?PERTINENT HISTORY:  ? ?PRECAUTIONS: Muscle weakness (generalized) ?  ?Other abnormalities of gait and mobility ? ?SUBJECTIVE: Pt reports he is taking Steroids now for back pain.  Has MRI scheduled for back on 08/05/2021.  Reports he has began the HEP daily without questions.   ? ?PAIN:  ?Are you having pain? Yes: NPRS scale: 8/10 ?Pain location: LBP ?Pain description: sore ?Aggravating factors: walking ?Relieving factors: sitting calms down. ?Onset date: 3/23 ? ?OBJECTIVE: (objective measures completed at initial evaluation unless otherwise dated) ? ? ?  OBJECTIVE:  ?  ?DIAGNOSTIC FINDINGS:  ?PET scan 4/13 IMPRESSION: ?1. New destructive T5 vertebral body lesion is hypermetabolic and ?consistent with metastatic disease. Spinal canal involvement is ?noted. Recommend MRI thoracic spine without and with contrast for ?further evaluation and consideration for radiation treatment. ?2. Stable post treatment changes involving both lungs. No new or ?progressive findings. Stable bilateral pulmonary nodules. ?3. Stable small mildly hypermetabolic right hilar node. ?4. No findings for abdominal/pelvic metastatic disease. ?  ?MRI 06/15/21 IMPRESSION: ?Positive for 5 brain metastases, the largest measuring 2.1 cm in the ?right cerebellum where there is vasogenic edema with local mass ?effect. No hydrocephalus. ?  ?Observation: ?Ambulates with RW, crouched posture, unsteady, trunk flexed ?  ?COGNITION: ?          Overall cognitive status: Within  functional limits for tasks assessed               ?           ?SENSATION: ?WFL ?  ?  ?POSTURE:  ?Trunk flexed ?  ?LE ROM: decreased hip extension bilaterally ?  ?Active ROM Right ?07/18/2021 Left ?07/19/18

## 2021-08-05 ENCOUNTER — Ambulatory Visit (HOSPITAL_COMMUNITY)
Admission: RE | Admit: 2021-08-05 | Discharge: 2021-08-05 | Disposition: A | Discharge status: Home / Self Care | Payer: Medicare Other | Source: Ambulatory Visit | Attending: Radiation Oncology | Admitting: Radiation Oncology

## 2021-08-05 ENCOUNTER — Encounter (HOSPITAL_COMMUNITY): Payer: Medicare Other | Admitting: Physical Therapy

## 2021-08-05 DIAGNOSIS — C7931 Secondary malignant neoplasm of brain: Secondary | ICD-10-CM | POA: Diagnosis not present

## 2021-08-05 DIAGNOSIS — C7951 Secondary malignant neoplasm of bone: Secondary | ICD-10-CM | POA: Diagnosis not present

## 2021-08-05 DIAGNOSIS — M5030 Other cervical disc degeneration, unspecified cervical region: Secondary | ICD-10-CM | POA: Diagnosis not present

## 2021-08-05 DIAGNOSIS — M5020 Other cervical disc displacement, unspecified cervical region: Secondary | ICD-10-CM | POA: Diagnosis not present

## 2021-08-05 DIAGNOSIS — C801 Malignant (primary) neoplasm, unspecified: Secondary | ICD-10-CM | POA: Diagnosis not present

## 2021-08-05 DIAGNOSIS — C7949 Secondary malignant neoplasm of other parts of nervous system: Secondary | ICD-10-CM | POA: Diagnosis not present

## 2021-08-05 MED ORDER — GADOBUTROL 1 MMOL/ML IV SOLN
7.0000 mL | Freq: Once | INTRAVENOUS | Status: AC | PRN
  Administered 2021-08-05: 7 mL via INTRAVENOUS

## 2021-08-05 NOTE — Progress Notes (Signed)
? ?                                                                                                                                                          ?  Patient Name: SLAYTER DATA ?MRN: [id number] ?DOB: [birth year] ?Referring Physician: [name] (Profile Not Attached) ?Date of Service: 06/24/2021 ?[facility], [place] ? ?                                                      End Of Treatment Note ? ?Diagnoses: C32.0-Malignant neoplasm of glottis ?C34.11-Malignant neoplasm of upper lobe, right bronchus or lung ?C34.12-Malignant neoplasm of upper lobe, left bronchus or lung ?C79.31-Secondary malignant neoplasm of brain ?C79.51-Secondary malignant neoplasm of bone ?Z85.21-Personal history of malignant neoplasm of larynx ? ?Cancer Staging:  Cancer Staging  ?Squamous cell carcinoma of glottis (HCC) ?Staging form: Larynx - Glottis, AJCC 8th Edition ?- Clinical stage from 10/09/2018: Stage IVB (cT4b, cN3b, cM0) - Signed by Lonie Peak, MD on 10/09/2018 ? ?Intent: Palliative ? ?Radiation Treatment Dates: 06/13/2021 through 06/24/2021 ?Site Technique Total Dose (Gy) Dose per Fx (Gy) Completed Fx Beam Energies  ?Brain: Brain_SRS IMRT 18/18 18 1/1 6XFFF  ?Thoracic Spine: Spine_T4-T6 3D 30/30 3 10/10 10X  ? ?Narrative: The patient tolerated radiation therapy relatively well.  ? ?Plan: The patient will follow-up with radiation oncology in 31mo . ?----------------------------------- ? ?Lonie Peak, MD ? ? ?

## 2021-08-08 ENCOUNTER — Telehealth: Payer: Self-pay | Admitting: *Deleted

## 2021-08-08 ENCOUNTER — Inpatient Hospital Stay: Payer: Medicare Other

## 2021-08-08 NOTE — Telephone Encounter (Signed)
Called patient to inform that fu with Dr. Isidore Moos has been moved to 08-10-21 @ 11:50 am, spoke with patient and he is aware of this new date and time ?

## 2021-08-09 ENCOUNTER — Ambulatory Visit (HOSPITAL_COMMUNITY): Payer: Medicare Other | Admitting: Physical Therapy

## 2021-08-09 ENCOUNTER — Other Ambulatory Visit: Payer: Self-pay | Admitting: Radiation Oncology

## 2021-08-09 ENCOUNTER — Ambulatory Visit: Payer: Medicare Other | Admitting: Radiation Oncology

## 2021-08-09 ENCOUNTER — Telehealth (HOSPITAL_COMMUNITY): Payer: Self-pay | Admitting: Physical Therapy

## 2021-08-09 NOTE — Progress Notes (Incomplete)
Mr. Bartell presents today to review MRI imaging from 08/05/2021 ? ?Pain:  ?Balance: ?Ambulatory status: ?Numbness/weakness: ?Other issues of note: Meeting with Palliative Care provider later today ?

## 2021-08-09 NOTE — Telephone Encounter (Signed)
First no show.  Reminded pt that his next appointment is on Thursday. ? ?Rayetta Humphrey, PT CLT ?[phone]  ?

## 2021-08-10 ENCOUNTER — Telehealth: Payer: Self-pay | Admitting: Hematology and Oncology

## 2021-08-10 ENCOUNTER — Encounter: Payer: Self-pay | Admitting: Radiation Oncology

## 2021-08-10 ENCOUNTER — Encounter: Payer: Self-pay | Admitting: Nurse Practitioner

## 2021-08-10 ENCOUNTER — Other Ambulatory Visit: Payer: Self-pay

## 2021-08-10 ENCOUNTER — Ambulatory Visit
Admission: RE | Admit: 2021-08-10 | Discharge: 2021-08-10 | Disposition: A | Discharge status: Home / Self Care | Payer: Medicare Other | Source: Ambulatory Visit | Attending: Radiation Oncology | Admitting: Radiation Oncology

## 2021-08-10 ENCOUNTER — Inpatient Hospital Stay (HOSPITAL_BASED_OUTPATIENT_CLINIC_OR_DEPARTMENT_OTHER): Payer: Medicare Other | Admitting: Nurse Practitioner

## 2021-08-10 VITALS — BP 159/62 | HR 69 | Temp 97.6°F | Resp 18 | Ht 69.0 in | Wt 159.0 lb

## 2021-08-10 DIAGNOSIS — C3411 Malignant neoplasm of upper lobe, right bronchus or lung: Secondary | ICD-10-CM

## 2021-08-10 DIAGNOSIS — Z87891 Personal history of nicotine dependence: Secondary | ICD-10-CM | POA: Insufficient documentation

## 2021-08-10 DIAGNOSIS — C720 Malignant neoplasm of spinal cord: Secondary | ICD-10-CM

## 2021-08-10 DIAGNOSIS — Z7952 Long term (current) use of systemic steroids: Secondary | ICD-10-CM | POA: Diagnosis not present

## 2021-08-10 DIAGNOSIS — G893 Neoplasm related pain (acute) (chronic): Secondary | ICD-10-CM | POA: Diagnosis not present

## 2021-08-10 DIAGNOSIS — R531 Weakness: Secondary | ICD-10-CM | POA: Diagnosis not present

## 2021-08-10 DIAGNOSIS — C7951 Secondary malignant neoplasm of bone: Secondary | ICD-10-CM | POA: Diagnosis not present

## 2021-08-10 DIAGNOSIS — C3412 Malignant neoplasm of upper lobe, left bronchus or lung: Secondary | ICD-10-CM

## 2021-08-10 DIAGNOSIS — C7931 Secondary malignant neoplasm of brain: Secondary | ICD-10-CM | POA: Diagnosis not present

## 2021-08-10 DIAGNOSIS — R530 Neoplastic (malignant) related fatigue: Secondary | ICD-10-CM

## 2021-08-10 DIAGNOSIS — C3492 Malignant neoplasm of unspecified part of left bronchus or lung: Secondary | ICD-10-CM

## 2021-08-10 DIAGNOSIS — Z923 Personal history of irradiation: Secondary | ICD-10-CM | POA: Insufficient documentation

## 2021-08-10 DIAGNOSIS — Z79899 Other long term (current) drug therapy: Secondary | ICD-10-CM | POA: Insufficient documentation

## 2021-08-10 DIAGNOSIS — Z7189 Other specified counseling: Secondary | ICD-10-CM | POA: Diagnosis not present

## 2021-08-10 DIAGNOSIS — Z515 Encounter for palliative care: Secondary | ICD-10-CM

## 2021-08-10 DIAGNOSIS — C329 Malignant neoplasm of larynx, unspecified: Secondary | ICD-10-CM

## 2021-08-10 MED ORDER — DEXAMETHASONE 4 MG PO TABS: ORAL_TABLET | ORAL | 0 refills | Status: DC

## 2021-08-10 NOTE — Progress Notes (Signed)
Pt seen in clinic today, DME ordered to be delivered to pt home via [employer].  ?

## 2021-08-10 NOTE — Telephone Encounter (Signed)
Scheduled appointment per provider request. Spoke with patient, he is aware of upcoming appointment and requested transportation to be set up. I sent a message to the transportation coordinator to get it arranged.  ?

## 2021-08-10 NOTE — Progress Notes (Signed)
? ?  ?Palliative Medicine ?[facility]  ?[phone] [phone] ? ? ?Name: Logan Price ?Date: 08/10/2021 ?MRN: [id number]  ?DOB: 12-03-1945 ? ?Patient Care Team: ?[name], FNP as PCP - General (Nurse Practitioner) ?Fields, Darleene Cleaver, MD (Inactive) as Consulting Physician (Gastroenterology) ?Lonie Peak, MD as Attending Physician (Radiation Oncology) ?Barrie Folk, RN (Inactive) as Oncology Nurse Navigator ?Newman Pies, MD as Consulting Physician (Otolaryngology) ?Isaiah Blakes, LCSW as [facility] Care Management (Licensed Visual merchandiser) ?Lanelle Bal, DO as Consulting Physician (Internal Medicine)  ? ? ?REASON FOR CONSULTATION: ?Logan Price is a 76 y.o. 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  Palliative ask to see for symptom management and goals of care.  ? ? ?SOCIAL HISTORY:    ? reports that he quit smoking about 14 years ago. His smoking use included cigarettes. He has a 40.00 pack-year smoking history. He has never used smokeless tobacco. He reports that he does not currently use alcohol. He reports that he does not use drugs. ? ?ADVANCE DIRECTIVES:  ?Patient does not have an advanced directive.  He expressed that Aniceto Boss will be considered his medical decision-maker in the event of emergency.  I discussed at length in the absence of healthcare power of attorney documentation that his children would legally be considered his next of kin.  MOLST form completed today. ? ?CODE STATUS: DNR ? ?PAST MEDICAL HISTORY: ?Past Medical History:  ?Diagnosis Date  ? Diverticulitis   ? Dysrhythmia 09/2018  ? episode of SVT while in hospital  ? False positive serological test for hepatitis C 12/13/2016  ? GERD (gastroesophageal reflux disease)   ?  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  ? trach  ? HTN (hypertension)   ? Hyperlipidemia   ? ? ?PAST SURGICAL HISTORY:  ?Past Surgical History:  ?Procedure Laterality Date  ? BIOPSY  05/04/2015  ? Procedure: BIOPSY;  Surgeon: West Bali, MD;  [facility];  Service: Endoscopy;;  bx's of ileocecal valve ?  ? BIOPSY  06/22/2020  ? Procedure: BIOPSY;  Surgeon: Lanelle Bal, DO;  [facility];  Service: Endoscopy;;  ? BRONCHIAL BRUSHINGS  04/29/2019  ? Procedure: BRONCHIAL BRUSHINGS;  Surgeon: Josephine Igo, DO;  [facility];  Service: Pulmonary;;  ? BRONCHIAL NEEDLE ASPIRATION BIOPSY  04/29/2019  ? Procedure: BRONCHIAL NEEDLE ASPIRATION BIOPSIES;  Surgeon: Josephine Igo, DO;  [facility];  Service: Pulmonary;;  ? BRONCHIAL WASHINGS  04/29/2019  ? Procedure: BRONCHIAL WASHINGS;  Surgeon: Josephine Igo, DO;  [facility];  Service: Pulmonary;;  ? COLONOSCOPY WITH PROPOFOL N/A 05/04/2015  ? Dr. Darrick Penna: normal appearing ileum with prominent IC valve with tubular adenomas, moderate diverticulosis in sigmoid colon, ascending colon, and retum. Moderate sized internal hemorrhoids. Surveillance in 5 years  ? COLONOSCOPY WITH PROPOFOL N/A 06/22/2020  ? Procedure: COLONOSCOPY WITH PROPOFOL;  Surgeon: Lanelle Bal, DO;  [facility];  Service: Endoscopy;  Laterality: N/A;  PM  ? ELECTROMAGNETIC NAVIGATION BROCHOSCOPY  04/29/2019  ? Procedure: NAVIGATION BRONCHOSCOPY;  Surgeon: Josephine Igo, DO;  [facility];  Service: Pulmonary;;  ? ESOPHAGOGASTRODUODENOSCOPY (EGD) WITH PROPOFOL N/A 12/19/2016  ? Procedure: ESOPHAGOGASTRODUODENOSCOPY (EGD) WITH PROPOFOL;  Surgeon: West Bali, MD;  [facility];  Service:  Endoscopy;  Laterality: N/A;  11:30am  ? FLEXIBLE SIGMOIDOSCOPY N/A 12/10/2015  ? hemorrhoid banding X 3   ? HEMORRHOID BANDING N/A 12/10/2015  ? Procedure: HEMORRHOID BANDING;  Surgeon: West Bali, MD;  [facility];  Service:  Endoscopy;  Laterality: N/A;  1:30 PM  ? INSERTION OF SUPRAPUBIC CATHETER N/A 02/24/2019  ? Procedure: INSERTION OF SUPRAPUBIC CATHETER;  Surgeon: Ihor Gully, MD;  [facility];  Service: Urology;  Laterality: N/A;  ? IR CM INJ ANY COLONIC TUBE W/FLUORO  10/30/2018  ? IR GASTROSTOMY TUBE MOD SED  10/04/2018  ? IR GASTROSTOMY TUBE REMOVAL  10/08/2019  ? IR IMAGING GUIDED PORT INSERTION  10/04/2018  ? IR REMOVAL TUN ACCESS W/ PORT W/O FL MOD SED  10/14/2018  ? IR REPLACE G-TUBE SIMPLE WO FLUORO  11/03/2018  ? IR REPLACE G-TUBE SIMPLE WO FLUORO  07/28/2019  ? LAPAROSCOPIC INSERTION GASTROSTOMY TUBE Left 10/07/2018  ? Procedure: LAPAROSCOPIC  GASTROSTOMY TUBE;  Surgeon: Kinsinger, De Blanch, MD;  [facility];  Service: General;  Laterality: Left;  ? MICROLARYNGOSCOPY N/A 09/27/2018  ? Procedure: MICRO DIRECT LARYNGOSCOPY WITH BIOPSY;  Surgeon: Newman Pies, MD;  [facility];  Service: ENT;  Laterality: N/A;  ? None to date    ? As of 04/14/15  ? POLYPECTOMY  05/04/2015  ? Procedure: POLYPECTOMY;  Surgeon: West Bali, MD;  [facility];  Service: Endoscopy;;  descending colon polyp, ascending colon polyp  ? SAVORY DILATION N/A 12/19/2016  ? Procedure: SAVORY DILATION;  Surgeon: West Bali, MD;  [facility];  Service: Endoscopy;  Laterality: N/A;  ? TRACHEOSTOMY TUBE PLACEMENT N/A 09/27/2018  ? Procedure: AWAKE TRACHEOSTOMY;  Surgeon: Newman Pies, MD;  [facility];  Service: ENT;  Laterality: N/A;  ? TRANSURETHRAL RESECTION OF PROSTATE N/A 02/24/2019  ? Procedure: TRANSURETHRAL RESECTION OF THE PROSTATE (TURP);  Surgeon: Ihor Gully, MD;  [facility];  Service: Urology;  Laterality: N/A;  ? VIDEO BRONCHOSCOPY WITH ENDOBRONCHIAL NAVIGATION N/A 04/29/2019  ? Procedure: VIDEO BRONCHOSCOPY;  Surgeon: Josephine Igo, DO;  [facility];  Service: Pulmonary;  Laterality: N/A;  ? ? ?HEMATOLOGY/ONCOLOGY HISTORY:  ?Oncology History  ?Squamous cell carcinoma of glottis (HCC)  ?09/27/2018  Initial Diagnosis  ? Squamous cell carcinoma of glottis (HCC) ? ?  ?10/09/2018 Cancer Staging  ? Staging form: Larynx - Glottis, AJCC 8th Edition ?- Clinical stage from 10/09/2018: Stage IVB (cT4b, cN3b, cM0) - Signed by Lonie Peak, MD on 10/09/2018 ? ?  ? ? ?ALLERGIES:  has No Known Allergies. ? ?MEDICATIONS:  ?Current Outpatient Medications  ?Medication Sig Dispense Refill  ? acetaminophen (TYLENOL) 650 MG CR tablet Take 650 mg by mouth every 8 (eight) hours as needed for pain.    ? benzonatate (TESSALON PERLES) 100 MG capsule Take 1 capsule (100 mg total) by mouth 3 (three) times daily as needed. (Patient not taking: Reported on 08/10/2021) 20 capsule 0  ? dexamethasone (DECADRON) 4 MG tablet Take 2 tablets on first dose with food. Then, take 1 tablet BID with breakfast and dinner. 30 tablet 0  ? doxazosin (CARDURA) 1 MG tablet Place 1 tablet (1 mg total) into feeding tube daily. (Patient taking differently: Take 1 mg by mouth daily.) 30 tablet 5  ? ondansetron (ZOFRAN) 4 MG tablet Take 1 tablet (4 mg total) by mouth every 8 (eight) hours as needed for nausea or vomiting. (Patient not taking: Reported on 08/10/2021) 20 tablet 0  ?  pantoprazole (PROTONIX) 40 MG tablet TAKE ONE TABLET DAILY 30 MINUTES PRIOR TO THE FIRST MEAL 90 tablet 1  ? simvastatin (ZOCOR) 20 MG tablet TAKE 1 TABLET DAILY AT 6PM 90 tablet 1  ? ?No current facility-administered medications for this visit.  ? ? ?VITAL SIGNS: ?There were no vitals taken for this visit. ?There were no vitals filed for this visit.  ?Estimated body mass index is 23.48 kg/m? as calculated from the following: ?  Height as of an earlier encounter on 08/10/21: 5\' 9"  (1.753 m). ?  Weight as of an earlier encounter on 08/10/21: 159 lb (72.1 kg). ? ?LABS: ?CBC: ?   ?Component Value Date/Time  ? WBC 4.4 10/07/2020 1030  ? WBC 10.1 04/30/2019 0935  ? HGB 12.9 (L) 10/07/2020 1030  ? HCT 39.5 10/07/2020 1030  ? PLT 185 10/07/2020 1030  ? MCV 91 10/07/2020 1030  ? NEUTROABS 3.0  10/07/2020 1030  ? LYMPHSABS 0.9 10/07/2020 1030  ? MONOABS 0.6 03/08/2019 2230  ? EOSABS 0.1 10/07/2020 1030  ? BASOSABS 0.0 10/07/2020 1030  ? ?Comprehensive Metabolic Panel: ?   ?Component Value Date/Time  ? NA 141

## 2021-08-10 NOTE — Progress Notes (Signed)
Mr. Laymon presents today to review MRI imaging from 08/05/2021 ? ?Pain: Pain in his back ?Balance: Balance is off ?Ambulatory status: Can ambulate with unsteady gait ?Numbness/weakness:None ?Other issues of note: Meeting with Palliative Care provider later today ?Vitals:  ? 08/10/21 1157  ?BP: (!) 159/62  ?Pulse: 69  ?Resp: 18  ?Temp: 97.6 ?F (36.4 ?C)  ?TempSrc: Temporal  ?SpO2: 98%  ?Weight: 72.1 kg  ?Height: 5\' 9"  (1.753 m)  ? ? ?

## 2021-08-11 ENCOUNTER — Encounter (HOSPITAL_COMMUNITY): Payer: Medicare Other | Admitting: Physical Therapy

## 2021-08-11 DIAGNOSIS — C720 Malignant neoplasm of spinal cord: Secondary | ICD-10-CM | POA: Insufficient documentation

## 2021-08-11 NOTE — Progress Notes (Signed)
Radiation Oncology         [phone] ________________________________  Name: Logan Price MRN: [id number]  Date: 08/10/2021  DOB: [birth year]  Follow-Up Visit Note in person  CC: Logan Spencer, FNP  Logan Spencer, FNP  Diagnosis and Prior Radiotherapy:       ICD-10-CM   1. Malignant neoplasm metastatic to bone (HCC)  C79.51     2. Metastasis to brain (HCC)  C79.31 dexamethasone (DECADRON) 4 MG tablet    3. 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 Primary cancer of left upper lobe of lung (HCC)  C34.12     5. Malignant neoplasm of right upper lobe of lung (HCC)  C34.11     6. Malignant 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)  C79.31     7. 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 Adenocarcinoma, lung, left (HCC)  C34.92         Cancer Staging  Squamous cell carcinoma of glottis (HCC) Staging form: Larynx - Glottis, AJCC 8th Edition - Clinical stage from 10/09/2018: Stage IVB (cT4b, cN3b, cM0) - Signed by Lonie Peak, MD on 10/09/2018 Now with METASTATIC DISEASE TO LUNG, BONE + BRAIN   Radiation Treatment Dates: 10/22/2018 through 12/10/2018 Site Technique Total Dose Dose per Fx Completed Fx Beam Energies  Head & neck: HN_larynx IMRT 70/70 2 35/35 6X   Radiation Treatment Dates: 06/03/2019 through 06/13/2019 Site Technique Total Dose (Gy) Dose per Fx (Gy) Completed Fx Beam Energies  Lung, Right: Lung_Rt_Upper IMRT 60/60 12 5/5 6XFFF  Lung, Left: Lung_Lt_Upper IMRT 60/60 12 5/5 6XFFF   Radiation Treatment Dates: 01/18/2021 through 01/28/2021 Site Technique Total Dose (Gy) Dose per Fx (Gy) Completed Fx Beam Energies  Lung, Right: Lung_Rt_Upper IMRT 60/60 12 5/5 6XFFF   Radiation Treatment Dates: 06/13/2021 through 06/24/2021 Site Technique Total Dose (Gy) Dose per Fx (Gy) Completed Fx Beam Energies  Brain: Brain_SRS IMRT 18/18 18 1/1 6XFFF  Thoracic Spine: Spine_T4-T6 3D 30/30 3 10/10 10X   CHIEF COMPLAINT:  Here for follow-up and surveillance of cancer   Narrative:    Logan  Price presents today with his significant other. Nikki from palliative care is present with [place], too. His recent MRI of the spine was discussed this week at tumor boards - both CNS and ENT.  I have personally discussed him with Dr Conchita Paris. Unfortunately there appears to be tethering of the spinal cord where his bone disease was recently tx'd in the mid thoracic spine.  There is also leptomeningeal disease throughout the spinal canal, especially in the lower canal. Patient has stable L-S back pain radiating down his legs. Stable difficulty with ambulation, uses a walker at home.  Taking Dexamethasone 4mg  bid    ALLERGIES:  has No Known Allergies.  Meds: Current Outpatient Medications  Medication Sig Dispense Refill   acetaminophen (TYLENOL) 650 MG CR tablet Take 650 mg by mouth every 8 (eight) hours as needed for pain.     doxazosin (CARDURA) 1 MG tablet Place 1 tablet (1 mg total) into feeding tube daily. (Patient taking differently: Take 1 mg by mouth daily.) 30 tablet 5   pantoprazole (PROTONIX) 40 MG tablet TAKE ONE TABLET DAILY 30 MINUTES PRIOR TO THE FIRST MEAL 90 tablet 1   simvastatin (ZOCOR) 20 MG tablet TAKE 1 TABLET DAILY AT 6PM 90 tablet 1   benzonatate (TESSALON PERLES) 100 MG capsule Take 1 capsule (100 mg total) by mouth 3 (three) times daily as needed. (  Patient not taking: Reported on 08/10/2021) 20 capsule 0   dexamethasone (DECADRON) 4 MG tablet Take one tablet daily with breakfast. 30 tablet 0   ondansetron (ZOFRAN) 4 MG tablet Take 1 tablet (4 mg total) by mouth every 8 (eight) hours as needed for nausea or vomiting. (Patient not taking: Reported on 08/10/2021) 20 tablet 0   No current facility-administered medications for this encounter.    Physical Findings: The patient is in no acute distress. Patient is alert and oriented. Wt Readings from Last 3 Encounters:  08/10/21 159 lb (72.1 kg)  07/14/21 160 lb 8 oz (72.8 kg)  06/23/21 163 lb 1.6 oz (74 kg)    height is 5\' 9"   (1.753 m) and weight is 159 lb (72.1 kg). His temporal temperature is 97.6 F (36.4 C). His blood pressure is 159/62 (abnormal) and his pulse is 69. His respiration is 18 and oxygen saturation is 98%. .  General: Alert and oriented, in no acute distress.  HEENT: no thrush  MSK: strength is symmetric; nontender to palpation at T5; decreased bilateral strength per hip flexion.  Presents in a wheelchair. Tender at lower L spine, upper S spine.  Lab Findings: Lab Results  Component Value Date   WBC 4.4 10/07/2020   HGB 12.9 (L) 10/07/2020   HCT 39.5 10/07/2020   MCV 91 10/07/2020   PLT 185 10/07/2020    Lab Results  Component Value Date   TSH 3.886 03/04/2021    Radiographic Findings:   Logan TOTAL SPINE METS SCREENING  Result Date: 08/08/2021 CLINICAL DATA:  Neoplasm, assess treatment response. EXAM: MRI TOTAL SPINE WITHOUT AND WITH CONTRAST TECHNIQUE: Multisequence Logan imaging of the spine from the cervical spine to the sacrum was performed prior to and following IV contrast administration for evaluation of spinal metastatic disease. CONTRAST:  7mL GADAVIST GADOBUTROL 1 MMOL/ML IV SOLN COMPARISON:  None Available. FINDINGS: MRI CERVICAL SPINE FINDINGS Alignment: Physiologic Vertebrae: No evidence of bone lesion or incidental fracture. 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 No cord edema or abnormal intrathecal enhancement. 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 Disc levels: Generalized disc space narrowing and bulging with ridging. No cord impingement MRI THORACIC SPINE FINDINGS Alignment: Mild exaggerated thoracic kyphosis. Negative for listhesis. Vertebrae: Marrow replacing lesion in the T5 body, left eccentric where there is greater posterior element extension and where there is extraosseous extension into the T4-5 more than T5-6 foramina. Some altered marrow  signal in the left T4 body and articular process which could be reactive edema or tumor. Cord: Worsening of the syrinx measuring up to 5 mm in thickness with compression of the cord at the level of T5-6 from behind. Expanded dorsal CSF intensity space without any enhancing mass seen posteriorly. There is some dorsal epidural venous plexus type smooth enhancement which is accentuated on sagittal fat saturated postcontrast imaging. On the same sequence nodular enhancement is seen along the lower cord at the level of T11 and T12 with nodules measuring up to 6 mm at the level of T12. Paraspinal and other soft tissues: Pulmonary nodules in the right lung measuring up to 1 cm on series 34 Disc levels: No significant degenerative change MRI LUMBAR SPINE FINDINGS Segmentation:  5 lumbar type vertebrae Alignment:  Normal Vertebrae:  No evidence of bony metastasis Conus medullaris: Extends to the L2 level and appears normal. Nodularity along the cauda equina with  enhancement compatible with leptomeningeal spread of tumor. The largest nodule is seen at the L2-3 disc space and measures 8 mm. Paraspinal and other soft tissues: Negative Disc levels: No degenerative impingement. This case is slated to be reviewed at tumor board this morning. IMPRESSION: 1. Known T5 body metastasis which is left eccentric with T4-5 more than T5-6 foraminal infiltration. Reactive edema versus infiltration into the T4 vertebra. 2. 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 3. 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 No visible enhancing metastatic disease at this level, question some tethering from the T5 vertebral metastasis or leptomeningeal adhesion. Electronically Signed   By: Tiburcio Pea M.D.   On: 08/08/2021 04:55     Impression/Plan:    Disease status: This is a wonderful patient with h/o locally advanced larygeal cancer. 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 I explained the seriousness of this disease evolution.  Portion of brain that was imaged shows improvement in brain metastases s/p SRS  [facility] does not recommend surgical intervention. I am doubtful systemic therapy will be helpful but will refer him to Dr Al Pimple for her opinion officially. I think that radiation therapy is more likely to cause side effects than provide significant palliation or change in his prognosis.  I explained to him and his significant other that his disease has reached a point where I believe hospice is in his best interest.  I believe his life expectancy is in the range of months rather than years, and that his QOL and dignity will be best preserved with close supportive care from hospice.  They will speak further with Lowella Bandy this afternoon.  They would also like to see Dr Al Pimple as they digest his options.  I will see them PRN depending on their decision. We will reduce his dexamethasone to 4mg  daily to reduce side effect potential. He would like to try tylenol 650 mg PRN for pain as well.   2)  Thyroid function:  WNL   Lab Results  Component Value Date   TSH 3.886 03/04/2021     On date of service, in total, I spent 30 minutes on this encounter. Patient was seen in person. Note signed day after visit. Minute above pertain to date of visit, only.  ___   Lonie Peak, MD

## 2021-08-12 ENCOUNTER — Encounter: Payer: Self-pay | Admitting: Hematology and Oncology

## 2021-08-12 ENCOUNTER — Inpatient Hospital Stay (HOSPITAL_BASED_OUTPATIENT_CLINIC_OR_DEPARTMENT_OTHER): Payer: Medicare Other | Admitting: Hematology and Oncology

## 2021-08-12 ENCOUNTER — Inpatient Hospital Stay: Payer: Medicare Other | Admitting: Nurse Practitioner

## 2021-08-12 ENCOUNTER — Other Ambulatory Visit: Payer: Self-pay

## 2021-08-12 VITALS — BP 130/60 | HR 102 | Temp 98.1°F | Resp 18 | Ht 69.0 in | Wt 160.7 lb

## 2021-08-12 DIAGNOSIS — G893 Neoplasm related pain (acute) (chronic): Secondary | ICD-10-CM | POA: Diagnosis not present

## 2021-08-12 DIAGNOSIS — Z87891 Personal history of nicotine dependence: Secondary | ICD-10-CM | POA: Diagnosis not present

## 2021-08-12 DIAGNOSIS — Z515 Encounter for palliative care: Secondary | ICD-10-CM | POA: Diagnosis not present

## 2021-08-12 DIAGNOSIS — C3412 Malignant neoplasm of upper lobe, left bronchus or lung: Secondary | ICD-10-CM | POA: Diagnosis not present

## 2021-08-12 DIAGNOSIS — C3411 Malignant neoplasm of upper lobe, right bronchus or lung: Secondary | ICD-10-CM | POA: Diagnosis not present

## 2021-08-12 DIAGNOSIS — C7931 Secondary malignant neoplasm of brain: Secondary | ICD-10-CM | POA: Diagnosis not present

## 2021-08-12 DIAGNOSIS — C7951 Secondary malignant neoplasm of bone: Secondary | ICD-10-CM | POA: Diagnosis not present

## 2021-08-12 DIAGNOSIS — Z7952 Long term (current) use of systemic steroids: Secondary | ICD-10-CM | POA: Diagnosis not present

## 2021-08-12 NOTE — Progress Notes (Signed)
[facility] NOTE  Patient Care Team: Sharion Balloon, FNP as PCP - General (Nurse Practitioner) Danie Binder, MD (Inactive) as Consulting Physician (Gastroenterology) Eppie Gibson, MD as Attending Physician (Radiation Oncology) Leota Sauers, RN (Inactive) as Oncology Nurse Navigator Leta Baptist, MD as Consulting Physician (Otolaryngology) Katha Cabal, LCSW as [facility] (Licensed Clinical Social Worker) [name], Elon Alas, DO as Consulting Physician (Internal Medicine) Pickenpack-Cousar, Carlena Sax, NP as Nurse Practitioner (Nurse Practitioner)  CHIEF COMPLAINTS/PURPOSE OF CONSULTATION:  Metastatic carcinoma  ASSESSMENT & PLAN:  No problem-specific Assessment & Plan notes found for this encounter.  No orders of the defined types were placed in this encounter.  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  He then had a PET/CT which showed no residual tumor however was found to have a solid nodule within the anterior right middle lobe FDG avid and worrisome for primary bronchogenic carcinoma.  He had biopsy of this nodule which showed squamous cell carcinoma p16 negative.  He had another biopsy from the left upper lobe nodule which showed focal atypical glands suspicious for adenocarcinoma.  He then went underwent radiation to both these areas.  Most recent CT showed cerebellar lesions as well as a T5 lytic lesion.  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  He has a follow-up with Dr. Isidore Moos for monitoring of the brain metastasis.  He is here to review his PET/CT imaging results and treatment recommendations.  PET/CT on 07/07/2021 showed new destructive T5 vertebral body lesion which is hypermetabolic and consistent with metastatic disease.   Spinal canal involvement is noted. Recommend MRI thoracic spine without and with contrast for further evaluation.  He completed radiation to the T 5 lesion and had repeat imaging. 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  No visible enhancing metastatic disease at this level, likely leptomeningeal irritation.  He continues to complain of no symptoms.  He has some ongoing back pain but no worsening weakness, bowel or bladder incontinence or retention.  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  We do not have biopsy from his current disease to be sure if this is metastatic head and neck versus metastatic lung.  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  He is very reluctant to consider chemotherapy.  We have discussed about palliative care and hospice.  He interested in palliative care referral. He will return to clinic in approximately 1 week to discuss further recommendations, he would like to take some time to decide on next treatment recommendations however most likely will proceed with palliative care and hospice.  HISTORY OF PRESENTING ILLNESS:  Logan Price 76 y.o. 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  He then had PET which suggested no residual FDG avid tumor with in the neck,  however found solid nodule within the anterior right middle lobe, FDG avid and worrisome for either metastatic disease or primary bronchogenic carcinoma, persistent subsolid nodule within the lingula with mild FDG uptake, SUV max 3.8. Low grade pulmonary adenocarcinoma cannot be excluded, mild increased uptake identified with in bilateral hilar regions and left AP window region, cannot exclude nodal mets. Pathology from right upper lobe biopsy, showed SCC,  P16 neg in the tumor cells. Pathology from left upper lobe biopsy showed focal atypical glands suspicious for adenocarcinoma. He underwent radiation again to the right upper lobe and left upper lobe on 06/13/2019 He recently underwent CT which showed cerebellar lesions as well as T 5 lytic lesion MRI brain showed 5 brain metastases, largest measuring 2.1 cms in the right cerebellum where there is vasogenic edema with local mass effect. No hydrocephalus. He was presented in ENT TB and radiation oncology recommended palliative radiation to T5 as well as WBRT. Patient however wanted to proceed with SRS. PET scan done on 07/07/2021 showed new destructive T5 vertebral body lesion hypermetabolic, spinal canal involvement is noted. He underwent radiation to the T5 lesion, underwent MRI total spine and is here for follow-up  Interval History Logan Price is here for a follow up. He walks with a walker.  He continues to complain of pain in the back but otherwise no new complaints.  No worsening weakness of his lower extremities.  No bowel or bladder incontinence or retention Rest of the pertinent 10 point ROS reviewed and negative.  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   HTN (hypertension)    Hyperlipidemia     SURGICAL  HISTORY: Past Surgical History:  Procedure Laterality Date   BIOPSY  05/04/2015   Procedure: BIOPSY;  Surgeon: Danie Binder, MD;  [facility];  Service: Endoscopy;;  bx's of ileocecal valve    BIOPSY  06/22/2020   Procedure: BIOPSY;  Surgeon: Eloise Harman, DO;  [facility];  Service: Endoscopy;;   BRONCHIAL BRUSHINGS  04/29/2019   Procedure: BRONCHIAL BRUSHINGS;  Surgeon: Garner Nash, DO;  Location: [facility];  Service: Pulmonary;;   BRONCHIAL NEEDLE ASPIRATION BIOPSY  04/29/2019   Procedure: BRONCHIAL NEEDLE ASPIRATION BIOPSIES;  Surgeon: Garner Nash, DO;  Location: [facility];  Service: Pulmonary;;   BRONCHIAL WASHINGS  04/29/2019   Procedure: BRONCHIAL WASHINGS;  Surgeon: Garner Nash, DO;  Location: [facility];  Service: Pulmonary;;   COLONOSCOPY WITH PROPOFOL N/A 05/04/2015   Dr. Oneida Alar: normal appearing ileum with prominent IC valve with tubular adenomas, moderate diverticulosis in sigmoid colon, ascending colon, and retum. Moderate sized internal hemorrhoids. Surveillance in 5 years   COLONOSCOPY WITH PROPOFOL N/A 06/22/2020   Procedure: COLONOSCOPY WITH PROPOFOL;  Surgeon: Eloise Harman, DO;  [facility];  Service: Endoscopy;  Laterality: N/A;  PM   ELECTROMAGNETIC NAVIGATION BROCHOSCOPY  04/29/2019   Procedure: NAVIGATION BRONCHOSCOPY;  Surgeon: Garner Nash, DO;  Location: [facility];  Service: Pulmonary;;   ESOPHAGOGASTRODUODENOSCOPY (EGD) WITH PROPOFOL N/A 12/19/2016   Procedure: ESOPHAGOGASTRODUODENOSCOPY (EGD) WITH PROPOFOL;  Surgeon: Danie Binder, MD;  [facility];  Service: Endoscopy;  Laterality: N/A;  11:30am   FLEXIBLE SIGMOIDOSCOPY N/A 12/10/2015   hemorrhoid banding X 3    HEMORRHOID BANDING N/A 12/10/2015   Procedure: HEMORRHOID BANDING;  Surgeon: Danie Binder, MD;  [facility];  Service: Endoscopy;  Laterality: N/A;  1:30 PM   INSERTION OF SUPRAPUBIC CATHETER N/A 02/24/2019    Procedure: INSERTION OF SUPRAPUBIC CATHETER;  Surgeon: Kathie Rhodes, MD;  Location: [facility];  Service: Urology;  Laterality: N/A;   IR CM INJ ANY COLONIC TUBE W/FLUORO  10/30/2018   IR GASTROSTOMY TUBE MOD SED  10/04/2018   IR GASTROSTOMY TUBE REMOVAL  10/08/2019   IR IMAGING GUIDED PORT INSERTION  10/04/2018   IR REMOVAL TUN ACCESS W/ PORT W/O FL MOD SED  10/14/2018   IR REPLACE G-TUBE SIMPLE WO FLUORO  11/03/2018   IR REPLACE G-TUBE SIMPLE WO FLUORO  07/28/2019   LAPAROSCOPIC INSERTION GASTROSTOMY TUBE Left 10/07/2018   Procedure: LAPAROSCOPIC  GASTROSTOMY TUBE;  Surgeon: Mickeal Skinner, MD;  Location: [facility];  Service: General;  Laterality: Left;   MICROLARYNGOSCOPY N/A 09/27/2018   Procedure: MICRO DIRECT LARYNGOSCOPY WITH BIOPSY;  Surgeon: Leta Baptist, MD;  Location: [facility];  Service: ENT;  Laterality: N/A;   None to date     As of 04/14/15   POLYPECTOMY  05/04/2015   Procedure: POLYPECTOMY;  Surgeon: Danie Binder, MD;  [facility];  Service: Endoscopy;;  descending colon polyp, ascending colon polyp   SAVORY DILATION N/A 12/19/2016   Procedure: SAVORY DILATION;  Surgeon: Danie Binder, MD;  [facility];  Service: Endoscopy;  Laterality: N/A;   TRACHEOSTOMY TUBE PLACEMENT N/A 09/27/2018   Procedure: AWAKE TRACHEOSTOMY;  Surgeon: Leta Baptist, MD;  Location: [facility];  Service: ENT;  Laterality: N/A;   TRANSURETHRAL RESECTION OF PROSTATE N/A 02/24/2019   Procedure: TRANSURETHRAL RESECTION OF THE PROSTATE (TURP);  Surgeon: Kathie Rhodes, MD;  Location: [facility];  Service: Urology;  Laterality: N/A;   VIDEO BRONCHOSCOPY WITH ENDOBRONCHIAL NAVIGATION N/A 04/29/2019   Procedure: VIDEO BRONCHOSCOPY;  Surgeon: Garner Nash, DO;  Location: [facility];  Service: Pulmonary;  Laterality: N/A;    SOCIAL HISTORY: Social History   Socioeconomic History   Marital status: Married    Spouse name: [name]   Number of children: 9   Years of education: GED   Highest education level:  GED or equivalent  Occupational History   Occupation: Retired    Comment: [employer] services  Tobacco Use   Smoking status: Former    Packs/day: 1.00    Years: 40.00    Pack years: 40.00    Types: Cigarettes    Quit date: 10/09/2006    Years since quitting: 14.8   Smokeless tobacco: Never  Vaping Use   Vaping Use: Never used  Substance and Sexual Activity   Alcohol use: Not Currently    Alcohol/week: 0.0 standard drinks    Comment: he denies 01/06/19   Drug use: No   Sexual activity: Yes    Partners: Female    Birth control/protection: None  Other Topics Concern   Not on file  Social History Narrative   Lives with wife   Most of family lives in [place]   9 children      GED   Social Determinants of Health   Financial Resource Strain: Low Risk    Difficulty of Paying Living Expenses: Not hard at all  Food Insecurity: No Food Insecurity   Worried About  Running Out of Food in the Last Year: Never true   Ran Out of Food in the Last Year: Never true  Transportation Needs: No Transportation Needs   Lack of Transportation (Medical): No   Lack of Transportation (Non-Medical): No  Physical Activity: Insufficiently Active   Days of Exercise per Week: 2 days   Minutes of Exercise per Session: 10 min  Stress: Stress Concern Present   Feeling of Stress : To some extent  Social Connections: Moderately Integrated   Frequency of Communication with Friends and Family: More than three times a week   Frequency of Social Gatherings with Friends and Family: More than three times a week   Attends Religious Services: Never   Marine scientist or Organizations: Yes   Attends Music therapist: More than 4 times per year   Marital Status: Married  Human resources officer Violence: Not At Risk   Fear of Current or Ex-Partner: No   Emotionally Abused: No   Physically Abused: No   Sexually Abused: No    FAMILY HISTORY: Family History  Problem Relation Age of Onset    Cancer Mother    Hypertension Father    Hypertension Sister    Hypertension Brother    Hypertension Sister    Aneurysm Brother 67       brain   Colon cancer Neg Hx    Colon polyps Neg Hx     ALLERGIES:  has No Known Allergies.  MEDICATIONS:  Current Outpatient Medications  Medication Sig Dispense Refill   acetaminophen (TYLENOL) 650 MG CR tablet Take 650 mg by mouth every 8 (eight) hours as needed for pain.     benzonatate (TESSALON PERLES) 100 MG capsule Take 1 capsule (100 mg total) by mouth 3 (three) times daily as needed. (Patient not taking: Reported on 08/10/2021) 20 capsule 0   dexamethasone (DECADRON) 4 MG tablet Take one tablet daily with breakfast. 30 tablet 0   doxazosin (CARDURA) 1 MG tablet Place 1 tablet (1 mg total) into feeding tube daily. (Patient taking differently: Take 1 mg by mouth daily.) 30 tablet 5   ondansetron (ZOFRAN) 4 MG tablet Take 1 tablet (4 mg total) by mouth every 8 (eight) hours as needed for nausea or vomiting. (Patient not taking: Reported on 08/10/2021) 20 tablet 0   pantoprazole (PROTONIX) 40 MG tablet TAKE ONE TABLET DAILY 30 MINUTES PRIOR TO THE FIRST MEAL 90 tablet 1   simvastatin (ZOCOR) 20 MG tablet TAKE 1 TABLET DAILY AT 6PM 90 tablet 1   No current facility-administered medications for this visit.     PHYSICAL EXAMINATION: ECOG PERFORMANCE STATUS: 0 - Asymptomatic  Vitals:   08/12/21 0934  BP: 130/60  Pulse: (!) 102  Resp: 18  Temp: 98.1 F (36.7 C)  SpO2: 97%    Filed Weights   08/12/21 0934  Weight: 160 lb 11.2 oz (72.9 kg)    Physical Exam Constitutional:      Appearance: Normal appearance.  Neurological:     Mental Status: He is alert.     Gait: Gait abnormal (Walks with a walker).  Psychiatric:        Mood and Affect: Mood normal.    LABORATORY DATA:  I have reviewed the data as listed Lab Results  Component Value Date   WBC 4.4 10/07/2020   HGB 12.9 (L) 10/07/2020   HCT 39.5 10/07/2020   MCV 91 10/07/2020    PLT 185 10/07/2020     Chemistry  Component Value Date/Time   NA 141 10/07/2020 1030   K 4.0 10/07/2020 1030   CL 103 10/07/2020 1030   CO2 25 10/07/2020 1030   BUN 15 06/02/2021 1340   BUN 12 10/07/2020 1030   CREATININE 1.02 06/02/2021 1340   CREATININE 1.08 10/08/2012 1018      Component Value Date/Time   CALCIUM 9.2 10/07/2020 1030   ALKPHOS 53 10/07/2020 1030   AST 23 10/07/2020 1030   AST 18 09/18/2019 1030   ALT 18 10/07/2020 1030   ALT 13 09/18/2019 1030   BILITOT 0.3 10/07/2020 1030   BILITOT 0.4 09/18/2019 1030       RADIOGRAPHIC STUDIES: I have personally reviewed the radiological images as listed and agreed with the findings in the report. [facility] TOTAL SPINE METS SCREENING  Result Date: 08/08/2021 CLINICAL DATA:  Neoplasm, assess treatment response. EXAM: MRI TOTAL SPINE WITHOUT AND WITH CONTRAST TECHNIQUE: Multisequence Logan imaging of the spine from the cervical spine to the sacrum was performed prior to and following IV contrast administration for evaluation of spinal metastatic disease. CONTRAST:  22mL GADAVIST GADOBUTROL 1 MMOL/ML IV SOLN COMPARISON:  None Available. FINDINGS: MRI CERVICAL SPINE FINDINGS Alignment: Physiologic Vertebrae: No evidence of bone lesion or incidental fracture. 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 No cord edema or abnormal intrathecal enhancement. 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 Disc levels: Generalized disc space narrowing and bulging with ridging. No cord impingement MRI THORACIC SPINE FINDINGS Alignment: Mild exaggerated thoracic kyphosis. Negative for listhesis. Vertebrae: Marrow replacing lesion in the T5 body, left eccentric where there is greater posterior element extension and where there is extraosseous extension into the T4-5 more than T5-6 foramina. Some altered marrow signal in the left  T4 body and articular process which could be reactive edema or tumor. Cord: Worsening of the syrinx measuring up to 5 mm in thickness with compression of the cord at the level of T5-6 from behind. Expanded dorsal CSF intensity space without any enhancing mass seen posteriorly. There is some dorsal epidural venous plexus type smooth enhancement which is accentuated on sagittal fat saturated postcontrast imaging. On the same sequence nodular enhancement is seen along the lower cord at the level of T11 and T12 with nodules measuring up to 6 mm at the level of T12. Paraspinal and other soft tissues: Pulmonary nodules in the right lung measuring up to 1 cm on series 34 Disc levels: No significant degenerative change MRI LUMBAR SPINE FINDINGS Segmentation:  5 lumbar type vertebrae Alignment:  Normal Vertebrae:  No evidence of bony metastasis Conus medullaris: Extends to the L2 level and appears normal. Nodularity along the cauda equina with enhancement compatible with leptomeningeal spread of tumor. The largest nodule is seen at the L2-3 disc space and measures 8 mm. Paraspinal and other soft tissues: Negative Disc levels: No degenerative impingement. This case is slated to be reviewed at tumor board this morning. IMPRESSION: 1. Known T5 body metastasis which is left eccentric with T4-5 more than T5-6 foraminal infiltration. Reactive edema versus infiltration into the T4 vertebra. 2. 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 3. 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 No visible enhancing metastatic disease at this level, question some tethering from the T5 vertebral metastasis or leptomeningeal adhesion. Electronically Signed   By: Gilford Silvius.D.  On: 08/08/2021 04:55    All questions were answered. The patient knows to call the clinic with any problems, questions or concerns. I spent 30 minutes in the care of this patient  including H and P, review of records, counseling and coordination of care.     Benay Pike, MD 08/12/2021 9:43 AM

## 2021-08-15 ENCOUNTER — Other Ambulatory Visit: Payer: Self-pay | Admitting: *Deleted

## 2021-08-15 DIAGNOSIS — C320 Malignant neoplasm of glottis: Secondary | ICD-10-CM

## 2021-08-15 DIAGNOSIS — C329 Malignant neoplasm of larynx, unspecified: Secondary | ICD-10-CM

## 2021-08-15 DIAGNOSIS — C3411 Malignant neoplasm of upper lobe, right bronchus or lung: Secondary | ICD-10-CM

## 2021-08-16 ENCOUNTER — Other Ambulatory Visit (INDEPENDENT_AMBULATORY_CARE_PROVIDER_SITE_OTHER): Payer: Self-pay | Admitting: Internal Medicine

## 2021-08-16 ENCOUNTER — Ambulatory Visit (HOSPITAL_COMMUNITY): Payer: Medicare Other

## 2021-08-16 ENCOUNTER — Telehealth: Payer: Self-pay

## 2021-08-16 ENCOUNTER — Encounter (HOSPITAL_COMMUNITY): Payer: Self-pay

## 2021-08-16 DIAGNOSIS — R2689 Other abnormalities of gait and mobility: Secondary | ICD-10-CM

## 2021-08-16 DIAGNOSIS — K219 Gastro-esophageal reflux disease without esophagitis: Secondary | ICD-10-CM

## 2021-08-16 DIAGNOSIS — M6281 Muscle weakness (generalized): Secondary | ICD-10-CM | POA: Diagnosis not present

## 2021-08-16 DIAGNOSIS — G8929 Other chronic pain: Secondary | ICD-10-CM | POA: Diagnosis not present

## 2021-08-16 DIAGNOSIS — M545 Low back pain, unspecified: Secondary | ICD-10-CM | POA: Diagnosis not present

## 2021-08-16 NOTE — Therapy (Signed)
OUTPATIENT PHYSICAL THERAPY TREATMENT NOTE   Patient Name: Logan Price MRN: [id number] DOB:June 05, 1945, 76 y.o., male Today's Date: 08/16/2021  PCP: [name], FNP REFERRING PROVIDER: [name], FNP  END OF SESSION:   PT End of Session - 08/16/21 1301     Visit Number 4    Number of Visits 12    Date for PT Re-Evaluation 08/29/21    Authorization Type Primary: Medicare Secondary BCBS (for hospital only)( no coverage for PT)    Progress Note Due on Visit 10    PT Start Time 1301    PT Stop Time 1340    PT Time Calculation (min) 39 min    Activity Tolerance Patient tolerated treatment well;Patient limited by pain;No increased pain;Patient limited by fatigue    Behavior During Therapy [employer] for tasks assessed/performed             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   HTN (hypertension)    Hyperlipidemia    Past Surgical History:  Procedure Laterality Date   BIOPSY  05/04/2015   Procedure: BIOPSY;  Surgeon: West Bali, MD;  Location: [facility];  Service: Endoscopy;;  bx's of ileocecal valve    BIOPSY  06/22/2020   Procedure: BIOPSY;  Surgeon: Lanelle Bal, DO;  [facility]: [facility];  Service: Endoscopy;;   BRONCHIAL BRUSHINGS  04/29/2019   Procedure: BRONCHIAL BRUSHINGS;  Surgeon: Josephine Igo, DO;  Location: [facility];  Service: Pulmonary;;   BRONCHIAL NEEDLE ASPIRATION BIOPSY  04/29/2019   Procedure: BRONCHIAL NEEDLE ASPIRATION BIOPSIES;  Surgeon: Josephine Igo, DO;  Location: [facility];  Service: Pulmonary;;   BRONCHIAL WASHINGS  04/29/2019   Procedure: BRONCHIAL WASHINGS;  Surgeon: Josephine Igo, DO;  Location: [facility];  Service: Pulmonary;;   COLONOSCOPY WITH PROPOFOL N/A 05/04/2015   Dr. Darrick Penna: normal appearing  ileum with prominent IC valve with tubular adenomas, moderate diverticulosis in sigmoid colon, ascending colon, and retum. Moderate sized internal hemorrhoids. Surveillance in 5 years   COLONOSCOPY WITH PROPOFOL N/A 06/22/2020   Procedure: COLONOSCOPY WITH PROPOFOL;  Surgeon: Lanelle Bal, DO;  [facility]: [facility];  Service: Endoscopy;  Laterality: N/A;  PM   ELECTROMAGNETIC NAVIGATION BROCHOSCOPY  04/29/2019   Procedure: NAVIGATION BRONCHOSCOPY;  Surgeon: Josephine Igo, DO;  Location: [facility];  Service: Pulmonary;;   ESOPHAGOGASTRODUODENOSCOPY (EGD) WITH PROPOFOL N/A 12/19/2016   Procedure: ESOPHAGOGASTRODUODENOSCOPY (EGD) WITH PROPOFOL;  Surgeon: West Bali, MD;  [facility]: [facility];  Service: Endoscopy;  Laterality: N/A;  11:30am   FLEXIBLE SIGMOIDOSCOPY N/A 12/10/2015   hemorrhoid banding X 3    HEMORRHOID BANDING N/A 12/10/2015   Procedure: HEMORRHOID BANDING;  Surgeon: West Bali, MD;  [facility];  Service: Endoscopy;  Laterality: N/A;  1:30 PM   INSERTION OF SUPRAPUBIC CATHETER N/A 02/24/2019   Procedure: INSERTION OF SUPRAPUBIC CATHETER;  Surgeon: Ihor Gully, MD;  Location: [facility];  Service: Urology;  Laterality: N/A;   IR CM INJ ANY COLONIC TUBE W/FLUORO  10/30/2018   IR GASTROSTOMY TUBE MOD SED  10/04/2018   IR GASTROSTOMY TUBE REMOVAL  10/08/2019   IR IMAGING GUIDED PORT INSERTION  10/04/2018   IR REMOVAL TUN ACCESS W/ PORT W/O FL  MOD SED  10/14/2018   IR REPLACE G-TUBE SIMPLE WO FLUORO  11/03/2018   IR REPLACE G-TUBE SIMPLE WO FLUORO  07/28/2019   LAPAROSCOPIC INSERTION GASTROSTOMY TUBE Left 10/07/2018   Procedure: LAPAROSCOPIC  GASTROSTOMY TUBE;  Surgeon: Rodman Pickle, MD;  Location: [facility];  Service: General;  Laterality: Left;   MICROLARYNGOSCOPY N/A 09/27/2018   Procedure: MICRO DIRECT LARYNGOSCOPY WITH BIOPSY;  Surgeon: Newman Pies, MD;  Location: [facility];  Service: ENT;  Laterality: N/A;   None to date     As of 04/14/15   POLYPECTOMY  05/04/2015    Procedure: POLYPECTOMY;  Surgeon: West Bali, MD;  [facility];  Service: Endoscopy;;  descending colon polyp, ascending colon polyp   SAVORY DILATION N/A 12/19/2016   Procedure: SAVORY DILATION;  Surgeon: West Bali, MD;  [facility];  Service: Endoscopy;  Laterality: N/A;   TRACHEOSTOMY TUBE PLACEMENT N/A 09/27/2018   Procedure: AWAKE TRACHEOSTOMY;  Surgeon: Newman Pies, MD;  Location: [facility];  Service: ENT;  Laterality: N/A;   TRANSURETHRAL RESECTION OF PROSTATE N/A 02/24/2019   Procedure: TRANSURETHRAL RESECTION OF THE PROSTATE (TURP);  Surgeon: Ihor Gully, MD;  Location: [facility];  Service: Urology;  Laterality: N/A;   VIDEO BRONCHOSCOPY WITH ENDOBRONCHIAL NAVIGATION N/A 04/29/2019   Procedure: VIDEO BRONCHOSCOPY;  Surgeon: Josephine Igo, DO;  Location: [facility];  Service: Pulmonary;  Laterality: N/A;   Patient Active Problem List   Diagnosis Date Noted   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   Malignant 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) 06/20/2021   Malignant neoplasm metastatic to bone (HCC) 06/08/2021   Neck mass 02/15/2021   Low back pain 03/10/2020   Malignant neoplasm of right upper lobe of lung (HCC) 05/27/2019   Primary cancer of left upper lobe of lung (HCC) 05/27/2019   Tracheobronchitis 05/21/2019   Hx of radiation therapy 05/21/2019   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HCC) 05/21/2019   Squamous cell lung cancer, right (HCC) 05/21/2019   Adenocarcinoma, lung, left (HCC) 05/21/2019   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   Pleural effusion    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HCC)    Normocytic anemia    PSVT (paroxysmal supraventricular tachycardia) (HCC) 10/03/2018   Atrial fibrillation (HCC) 10/03/2018   Dysphagia    Loss of weight    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   Gastritis determined by endoscopy    Esophageal dysphagia    False positive serological test for hepatitis C 12/13/2016   Globus sensation 12/12/2016   Smokes  with greater than 40 pack year history 10/20/2016   Hemorrhoids, internal, with bleeding 10/06/2015   History of colonic polyps 04/14/2015   Vitamin D deficiency 10/09/2014   HLD (hyperlipidemia) 10/08/2012   GERD (gastroesophageal reflux disease) 10/08/2012   HTN (hypertension)    Diverticulitis 10/25/2009    REFERRING DIAG: R53.1 (ICD-10-CM) - Weakness   THERAPY DIAG:  Muscle weakness (generalized)  Other abnormalities of gait and mobility  Chronic bilateral low back pain, unspecified whether sciatica present  PERTINENT HISTORY:   PRECAUTIONS: Muscle weakness (generalized)   Other abnormalities of gait and mobility  SUBJECTIVE: Pt reports that he is in 5/10 back pain. Patient reports he has been very fatigued and its been very difficulty to perform HEP past few days. Pt reports seeing MD last week, MRI done and she doesn't think that the patient is going to get better, cancer has spread throughout spine from head to bottom of spine.   PAIN:  Are you having pain? Yes: NPRS scale: 5/10 Pain location: LBP Pain description: sore Aggravating factors: walking Relieving factors: sitting calms down. Onset date: 3/23  OBJECTIVE: (objective measures completed at initial evaluation unless otherwise dated)   OBJECTIVE:    DIAGNOSTIC FINDINGS:  PET scan 4/13 IMPRESSION: 1. New destructive T5 vertebral body lesion is hypermetabolic and consistent with metastatic disease. Spinal canal involvement is noted. Recommend MRI thoracic spine without and with contrast for further evaluation and consideration for radiation treatment. 2. Stable post treatment changes involving both lungs. No new or progressive findings. Stable bilateral pulmonary nodules. 3. Stable small mildly hypermetabolic right hilar node. 4. No findings for abdominal/pelvic metastatic disease.   MRI 06/15/21 IMPRESSION: Positive for 5 brain metastases, the largest measuring 2.1 cm in the right cerebellum where there  is vasogenic edema with local mass effect. No hydrocephalus.   Observation: Ambulates with RW, crouched posture, unsteady, trunk flexed   COGNITION:           Overall cognitive status: Within functional limits for tasks assessed                          SENSATION: WFL     POSTURE:  Trunk flexed   LE ROM: decreased hip extension bilaterally   Active ROM Right 07/18/2021 Left 07/18/2021 Left 07/28/21 Right 07/28/21  Hip flexion        Hip extension        Hip abduction        Hip adduction        Hip internal rotation        Hip external rotation        Knee flexion        Knee extension     23 lacking 18 degrees lacking  Ankle dorsiflexion        Ankle plantarflexion        Ankle inversion        Ankle eversion         (Blank rows = not tested)   LE MMT:   MMT Right 07/18/2021 Left 07/18/2021  Hip flexion 4/5 4/5  Hip extension      Hip abduction      Hip adduction      Hip internal rotation      Hip external rotation      Knee flexion 5/5 4-/5  Knee extension 5/5  4/5  Ankle dorsiflexion 5/5 5/5  Ankle plantarflexion      Ankle inversion      Ankle eversion       (Blank rows = not tested)       FUNCTIONAL TESTS:  Dynamic Gait Index: 6   [facility] PT Assessment - 07/18/21 0001                Standardized Balance Assessment    Standardized Balance Assessment Dynamic Gait Index          Dynamic Gait Index    Level Surface Moderate Impairment     Change in Gait Speed Moderate Impairment     Gait with Horizontal Head Turns Moderate Impairment     Gait with Vertical Head Turns Moderate Impairment     Gait and Pivot Turn Moderate Impairment     Step Over Obstacle Severe Impairment     Step Around Obstacles Severe Impairment     Steps Moderate Impairment     Total Score 6     DGI comment: high  risk for falls                     GAIT: Distance walked: 100 feet Assistive device utilized: Environmental consultant - 2 wheeled Level of assistance: CGA and Min A Comments:  slow, unsteady, trunk flexed with crouched gait, LLE buckling, poor foot clearance       TODAY'S TREATMENT:  08/16/21  Nustep 5 mins level 5  Hamstring curl x3 plates x3 mins  Knee extensions x1 plate 1O10  Sit to stand with UE support x7   Chair squats with hands on therapist shoulder x5  Standing marches with RW, cues for knee extension 3x10         08/02/21 Supine Bridge 3X10  SLR 2X10 each LE  SAQ 2X10 with 5" holds Sidelying hip abduction 2X10 each  Clams 2X10 with 5" holds Seated LAQ 10X5" holds    07/28/21: Reviewed goals Supine:  Quad sets 10x 5"  Bridge  SLR with quad sets Sidelying: clam  07/18/21 Alternating march 1x 10 5 seconds holds LAQ 1x 10 5 second holds  Seated HR/TR 1x 15     PATIENT EDUCATION:  Education details: 07/28/21:  Reviewed goals, educated importance of HEP Compliance for maximal benefits, pt able to recall seated exercise and complete correctly.   Eval:Patient educated on exam findings, POC, scope of PT, HEP, and compliance with HEP. Person educated: Patient Education method: Explanation, Demonstration, and Handouts Education comprehension: verbalized understanding, returned demonstration, verbal cues required, and tactile cues required       HOME EXERCISE PROGRAM:  08/16/21 Access Code: [email or web address] URL: [email or web address] Date: 08/16/2021 Prepared by: Aleatha Borer  Exercises - Sit to Stand with Counter Support  - 2 x daily - 7 x weekly - 2 sets - 10 reps - Standing March with Counter Support  - 2 x daily - 7 x weekly - 3 sets - 10 reps - Standing Hip Abduction with Unilateral Counter Support  - 2 x daily - 7 x weekly - 4 sets - 5 reps  07/18/21 Access Code: [facility] Exercises - Seated March  - 5 x daily - 7 x weekly - 2 sets - 10 reps - 5 second hold - Seated Long Arc Quad  - 5 x daily - 7 x weekly - 2 sets - 10 reps - 5 second  - Seated Heel Toe Raises  - 5 x daily - 7 x weekly - 2 sets - 10 reps - 5 second     ASSESSMENT:   CLINICAL IMPRESSION: Patient tolerates session well, focus on LE strength as per patient request. Patient ambulates with b/l flexed knees and increased difficulty trying to straighten up fully. Patient requires seated rest breaks due to quick onset of fatigue. Patient demonstrates improved quad activation post session during ambulation, requires cues intermittently.  Pt will continue to benefit from skilled physical therapy to improve weakness and reduce pain.      OBJECTIVE IMPAIRMENTS Abnormal gait, cardiopulmonary status limiting activity, decreased activity tolerance, decreased balance, decreased endurance, decreased knowledge of use of DME, decreased mobility, difficulty walking, decreased ROM, decreased strength, decreased safety awareness, impaired flexibility, improper body mechanics, postural dysfunction, and pain.    ACTIVITY LIMITATIONS cleaning, community activity, driving, meal prep, occupation, laundry, yard work, and shopping.    PERSONAL FACTORS Age, Fitness, Time since onset of injury/illness/exacerbation, and 3+ comorbidities: Chronic back pain, cancer with mets, sedentary  are also affecting patient's functional outcome.      REHAB POTENTIAL: Good  CLINICAL DECISION MAKING: Evolving/moderate complexity   EVALUATION COMPLEXITY: Moderate     GOALS: Goals reviewed with patient? Yes   SHORT TERM GOALS: Target date: 08/08/2021   Patient will be independent with HEP in order to improve functional outcomes. Baseline:  Goal status: Ongoing   2.  Patient will report at least 25% improvement in symptoms for improved quality of life. Baseline:  Goal status: Ongoing       LONG TERM GOALS: Target date: 08/29/2021   Patient will report at least 75% improvement in symptoms for improved quality of life. Baseline:  Goal status: Ongoing   2.  Patient will be able to complete 5x STS in under 20 seconds in order to reduce the risk of falls. Baseline:  Goal  status: Ongoing   3.  Patient will be able to ambulate at least 226 feet in in order to demonstrate improved gait speed for community ambulation.  Baseline:  Goal status: Ongoing   4.  Patient will score at least 14/24 on DGI in order to demonstrate improved balance to reduce the risk for falls at home and in the community.  Baseline: 6/24 Goal status: Ongoing             PLAN: PT FREQUENCY: 2x/week   PT DURATION: 6 weeks   PLANNED INTERVENTIONS: Therapeutic exercises, Therapeutic activity, Neuromuscular re-education, Balance training, Gait training, Patient/Family education, Joint manipulation, Joint mobilization, Stair training, Orthotic/Fit training, DME instructions, Aquatic Therapy, Dry Needling, Electrical stimulation, Spinal manipulation, Spinal mobilization, Cryotherapy, Moist heat, Compression bandaging, scar mobilization, Splintting, Taping, Traction, Ultrasound, Ionotophoresis 4mg /ml Dexamethasone, and Manual therapy     PLAN FOR NEXT SESSION: strength, gait, balance training    Alexanderjames Berg, PT 08/16/2021, 1:46 PM

## 2021-08-16 NOTE — Telephone Encounter (Signed)
Spoke with pt wife reminding her about phone visits tomorrow, pt wife verbalized understanding.

## 2021-08-17 ENCOUNTER — Encounter: Payer: Self-pay | Admitting: Nurse Practitioner

## 2021-08-17 ENCOUNTER — Encounter: Payer: Self-pay | Admitting: Hematology and Oncology

## 2021-08-17 ENCOUNTER — Inpatient Hospital Stay (HOSPITAL_BASED_OUTPATIENT_CLINIC_OR_DEPARTMENT_OTHER): Payer: Medicare Other | Admitting: Nurse Practitioner

## 2021-08-17 ENCOUNTER — Inpatient Hospital Stay (HOSPITAL_BASED_OUTPATIENT_CLINIC_OR_DEPARTMENT_OTHER): Payer: Medicare Other | Admitting: Hematology and Oncology

## 2021-08-17 DIAGNOSIS — C3411 Malignant neoplasm of upper lobe, right bronchus or lung: Secondary | ICD-10-CM | POA: Diagnosis not present

## 2021-08-17 DIAGNOSIS — C7931 Secondary malignant neoplasm of brain: Secondary | ICD-10-CM | POA: Diagnosis not present

## 2021-08-17 DIAGNOSIS — Z515 Encounter for palliative care: Secondary | ICD-10-CM

## 2021-08-17 DIAGNOSIS — Z7189 Other specified counseling: Secondary | ICD-10-CM

## 2021-08-17 DIAGNOSIS — C720 Malignant neoplasm of spinal cord: Secondary | ICD-10-CM | POA: Diagnosis not present

## 2021-08-17 DIAGNOSIS — C320 Malignant neoplasm of glottis: Secondary | ICD-10-CM

## 2021-08-17 DIAGNOSIS — G893 Neoplasm related pain (acute) (chronic): Secondary | ICD-10-CM

## 2021-08-17 DIAGNOSIS — C3412 Malignant neoplasm of upper lobe, left bronchus or lung: Secondary | ICD-10-CM

## 2021-08-17 DIAGNOSIS — Z87891 Personal history of nicotine dependence: Secondary | ICD-10-CM | POA: Diagnosis not present

## 2021-08-17 DIAGNOSIS — C7951 Secondary malignant neoplasm of bone: Secondary | ICD-10-CM

## 2021-08-17 DIAGNOSIS — Z7952 Long term (current) use of systemic steroids: Secondary | ICD-10-CM | POA: Diagnosis not present

## 2021-08-17 NOTE — Progress Notes (Signed)
[place] NOTE  Patient Care Team: Sharion Balloon, FNP as PCP - General (Nurse Practitioner) Danie Binder, MD (Inactive) as Consulting Physician (Gastroenterology) Eppie Gibson, MD as Attending Physician (Radiation Oncology) Leota Sauers, RN (Inactive) as Oncology Nurse Navigator Leta Baptist, MD as Consulting Physician (Otolaryngology) Katha Cabal, LCSW as [place] [employer] (Licensed Clinical Social Worker) [name], Elon Alas, DO as Consulting Physician (Internal Medicine) Pickenpack-Cousar, Carlena Sax, NP as Nurse Practitioner (Nurse Practitioner)  CHIEF COMPLAINTS/PURPOSE OF CONSULTATION:  Metastatic carcinoma  ASSESSMENT & PLAN:   This is a very pleasant 76 year old male patient with prior medical 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  He then had a PET/CT which showed no residual tumor however was found to have a solid nodule within the anterior right middle lobe FDG avid and worrisome for primary bronchogenic carcinoma.  He had biopsy of this nodule which showed squamous cell carcinoma p16 negative.  He had another biopsy from the left upper lobe nodule which showed focal atypical glands suspicious for adenocarcinoma.  He then went underwent radiation to both these areas.  Most recent CT showed cerebellar lesions as well as a T5 lytic lesion.  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  He has a follow-up with Dr. Isidore Moos for monitoring of the brain metastasis.  He is here to review his PET/CT imaging results and treatment recommendations.  PET/CT on 07/07/2021 showed new destructive T5 vertebral body lesion which is hypermetabolic and consistent with metastatic disease.  Spinal canal involvement is noted. Recommend MRI thoracic spine without and with contrast for further evaluation.  He completed  radiation to the T 5 lesion and had repeat imaging. 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  No visible enhancing metastatic disease at this level, likely leptomeningeal irritation. During her last visit, we have discussed that leptomeningeal carcinomatosis tends to have very poor prognosis and have discussed about considering palliative care and hospice. We have briefly discussed about chemotherapy although I do not believe he will have great response to it.    He states he is not symptomatic today.  He does notice some weakness in his lower extremities but no urinary retention or bowel retention or urinary incontinence or bowel incontinence he was clearly instructed to call us if he becomes symptomatic so we can send a referral to hospice.  He is very reluctant to consider any systemic chemotherapy at this point anyway.  He is very agreeable to hospice when he becomes symptomatic. We do not have a scheduled follow-up for him at this time.  He was clearly instructed to give [place] a call.Marland Kitchen  HISTORY OF PRESENTING ILLNESS:  ENOS MUHL 76 y.o. 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 He then had PET which suggested no residual FDG avid tumor with in the neck, however found solid nodule  within the anterior right middle lobe, FDG avid and worrisome for either metastatic disease or primary bronchogenic carcinoma,  persistent subsolid nodule within the lingula with mild FDG uptake, SUV max 3.8. Low grade pulmonary adenocarcinoma cannot be excluded, mild increased uptake identified with in bilateral hilar regions and left AP window region, cannot exclude nodal mets. Pathology from right upper lobe biopsy, showed SCC,  P16 neg in the tumor cells. Pathology from left upper lobe biopsy showed focal atypical glands suspicious for adenocarcinoma. He underwent radiation again to the right upper lobe and left upper lobe on 06/13/2019 He recently underwent CT which showed cerebellar lesions as well as T 5 lytic lesion MRI brain showed 5 brain metastases, largest measuring 2.1 cms in the right cerebellum where there is vasogenic edema with local mass effect. No hydrocephalus. He was presented in ENT TB and radiation oncology recommended palliative radiation to T5 as well as WBRT. Patient however wanted to proceed with SRS. PET scan done on 07/07/2021 showed new destructive T5 vertebral body lesion hypermetabolic, spinal canal involvement is noted. He underwent radiation to the T5 lesion, underwent MRI total spine and is here for follow-up  Interval History Mr Ignasiak is here for a telephone follow-up.  Since his last visit.  He denies any new complaints although sometimes he has noticed weakness in his lower extremities especially when he does some activity.  He denies any bowel incontinence or retention or urinary incontinence or retention. He denies any pain today.  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   HTN (hypertension)    Hyperlipidemia     SURGICAL HISTORY: Past Surgical History:  Procedure Laterality Date   BIOPSY  05/04/2015    Procedure: BIOPSY;  Surgeon: Danie Binder, MD;  [facility];  Service: Endoscopy;;  bx's of ileocecal valve    BIOPSY  06/22/2020   Procedure: BIOPSY;  Surgeon: Eloise Harman, DO;  [facility];  Service: Endoscopy;;   BRONCHIAL BRUSHINGS  04/29/2019   Procedure: BRONCHIAL BRUSHINGS;  Surgeon: Garner Nash, DO;  Location: [facility];  Service: Pulmonary;;   BRONCHIAL NEEDLE ASPIRATION BIOPSY  04/29/2019   Procedure: BRONCHIAL NEEDLE ASPIRATION BIOPSIES;  Surgeon: Garner Nash, DO;  Location: [facility];  Service: Pulmonary;;   BRONCHIAL WASHINGS  04/29/2019   Procedure: BRONCHIAL WASHINGS;  Surgeon: Garner Nash, DO;  [facility];  Service: Pulmonary;;   COLONOSCOPY WITH PROPOFOL N/A 05/04/2015   Dr. Oneida Alar: normal appearing ileum with prominent IC valve with tubular adenomas, moderate diverticulosis in sigmoid colon, ascending colon, and retum. Moderate sized internal hemorrhoids. Surveillance in 5 years   COLONOSCOPY WITH PROPOFOL N/A 06/22/2020   Procedure: COLONOSCOPY WITH PROPOFOL;  Surgeon: Eloise Harman, DO;  [facility];  Service: Endoscopy;  Laterality: N/A;  PM   ELECTROMAGNETIC NAVIGATION BROCHOSCOPY  04/29/2019   Procedure: NAVIGATION BRONCHOSCOPY;  Surgeon: Garner Nash, DO;  Location: [facility];  Service: Pulmonary;;   ESOPHAGOGASTRODUODENOSCOPY (EGD) WITH PROPOFOL N/A 12/19/2016   Procedure: ESOPHAGOGASTRODUODENOSCOPY (EGD) WITH PROPOFOL;  Surgeon: Danie Binder, MD;  [facility];  Service: Endoscopy;  Laterality: N/A;  11:30am   FLEXIBLE SIGMOIDOSCOPY N/A  12/10/2015   hemorrhoid banding X 3    HEMORRHOID BANDING N/A 12/10/2015   Procedure: HEMORRHOID BANDING;  Surgeon: Danie Binder, MD;  [facility];  Service: Endoscopy;  Laterality: N/A;  1:30 PM   INSERTION OF SUPRAPUBIC CATHETER N/A 02/24/2019   Procedure: INSERTION OF SUPRAPUBIC CATHETER;  Surgeon: Kathie Rhodes, MD;  Location: [facility];  Service: Urology;  Laterality: N/A;   IR CM INJ ANY COLONIC TUBE W/FLUORO  10/30/2018   IR GASTROSTOMY TUBE MOD SED  10/04/2018   IR GASTROSTOMY TUBE REMOVAL  10/08/2019   IR IMAGING GUIDED PORT INSERTION  10/04/2018   IR REMOVAL TUN ACCESS W/ PORT W/O FL MOD SED  10/14/2018   IR REPLACE G-TUBE SIMPLE WO FLUORO  11/03/2018   IR REPLACE G-TUBE SIMPLE WO FLUORO  07/28/2019   LAPAROSCOPIC INSERTION GASTROSTOMY TUBE Left 10/07/2018   Procedure: LAPAROSCOPIC  GASTROSTOMY TUBE;  Surgeon: Mickeal Skinner, MD;  Location: [facility];  Service: General;  Laterality: Left;   MICROLARYNGOSCOPY N/A 09/27/2018   Procedure: MICRO DIRECT LARYNGOSCOPY WITH BIOPSY;  Surgeon: Leta Baptist, MD;  [facility];  Service: ENT;  Laterality: N/A;   None to date     As of 04/14/15   POLYPECTOMY  05/04/2015   Procedure: POLYPECTOMY;  Surgeon: Danie Binder, MD;  [facility];  Service: Endoscopy;;  descending colon polyp, ascending colon polyp   SAVORY DILATION N/A 12/19/2016   Procedure: SAVORY DILATION;  Surgeon: Danie Binder, MD;  [facility];  Service: Endoscopy;  Laterality: N/A;   TRACHEOSTOMY TUBE PLACEMENT N/A 09/27/2018   Procedure: AWAKE TRACHEOSTOMY;  Surgeon: Leta Baptist, MD;  Location: [facility];  Service: ENT;  Laterality: N/A;   TRANSURETHRAL RESECTION OF PROSTATE N/A 02/24/2019   Procedure: TRANSURETHRAL RESECTION OF THE PROSTATE (TURP);  Surgeon: Kathie Rhodes, MD;  Location: [facility];  Service: Urology;  Laterality: N/A;   VIDEO BRONCHOSCOPY WITH ENDOBRONCHIAL NAVIGATION N/A 04/29/2019   Procedure: VIDEO BRONCHOSCOPY;  Surgeon: Garner Nash, DO;  Location: [facility];  Service: Pulmonary;  Laterality: N/A;    SOCIAL HISTORY: Social History   Socioeconomic History   Marital status: Married    Spouse name: [name]   Number of children: 9   Years of education: GED   Highest education level: GED or equivalent  Occupational History   Occupation: Retired    Comment: department of  social services  Tobacco Use   Smoking status: Former    Packs/day: 1.00    Years: 40.00    Pack years: 40.00    Types: Cigarettes    Quit date: 10/09/2006    Years since quitting: 14.8   Smokeless tobacco: Never  Vaping Use   Vaping Use: Never used  Substance and Sexual Activity   Alcohol use: Not Currently    Alcohol/week: 0.0 standard drinks    Comment: he denies 01/06/19   Drug use: No   Sexual activity: Yes    Partners: Female    Birth control/protection: None  Other Topics Concern   Not on file  Social History Narrative   Lives with wife   Most of family lives in [place]   9 children      GED   Social Determinants of Health   Financial Resource Strain: Low Risk    Difficulty of Paying Living Expenses: Not hard at all  Food Insecurity: No Food Insecurity   Worried About Charity fundraiser in the Last Year: Never  true   Ran Out of Food in the Last Year: Never true  Transportation Needs: No Transportation Needs   Lack of Transportation (Medical): No   Lack of Transportation (Non-Medical): No  Physical Activity: Insufficiently Active   Days of Exercise per Week: 2 days   Minutes of Exercise per Session: 10 min  Stress: Stress Concern Present   Feeling of Stress : To some extent  Social Connections: Moderately Integrated   Frequency of Communication with Friends and Family: More than three times a week   Frequency of Social Gatherings with Friends and Family: More than three times a week   Attends Religious Services: Never   Marine scientist or Organizations: Yes   Attends Music therapist: More than 4 times per year   Marital Status: Married  Human resources officer Violence: Not At Risk   Fear of Current or Ex-Partner: No   Emotionally Abused: No   Physically Abused: No   Sexually Abused: No    FAMILY HISTORY: Family History  Problem Relation Age of Onset   Cancer Mother    Hypertension Father    Hypertension Sister    Hypertension Brother     Hypertension Sister    Aneurysm Brother 79       brain   Colon cancer Neg Hx    Colon polyps Neg Hx     ALLERGIES:  has No Known Allergies.  MEDICATIONS:  Current Outpatient Medications  Medication Sig Dispense Refill   acetaminophen (TYLENOL) 650 MG CR tablet Take 650 mg by mouth every 8 (eight) hours as needed for pain.     benzonatate (TESSALON PERLES) 100 MG capsule Take 1 capsule (100 mg total) by mouth 3 (three) times daily as needed. (Patient not taking: Reported on 08/10/2021) 20 capsule 0   dexamethasone (DECADRON) 4 MG tablet Take one tablet daily with breakfast. 30 tablet 0   doxazosin (CARDURA) 1 MG tablet Place 1 tablet (1 mg total) into feeding tube daily. (Patient taking differently: Take 1 mg by mouth daily.) 30 tablet 5   ondansetron (ZOFRAN) 4 MG tablet Take 1 tablet (4 mg total) by mouth every 8 (eight) hours as needed for nausea or vomiting. (Patient not taking: Reported on 08/10/2021) 20 tablet 0   pantoprazole (PROTONIX) 40 MG tablet TAKE ONE TABLET DAILY 30 MINUTES PRIOR TO THE FIRST MEAL 90 tablet 0   simvastatin (ZOCOR) 20 MG tablet TAKE 1 TABLET DAILY AT 6PM 90 tablet 1   No current facility-administered medications for this visit.     PHYSICAL EXAMINATION: ECOG PERFORMANCE STATUS: 0 - Asymptomatic  There were no vitals filed for this visit.   There were no vitals filed for this visit.  Vital signs and physical examination not done, telephone visit  LABORATORY DATA:  I have reviewed the data as listed Lab Results  Component Value Date   WBC 4.4 10/07/2020   HGB 12.9 (L) 10/07/2020   HCT 39.5 10/07/2020   MCV 91 10/07/2020   PLT 185 10/07/2020     Chemistry      Component Value Date/Time   NA 141 10/07/2020 1030   K 4.0 10/07/2020 1030   CL 103 10/07/2020 1030   CO2 25 10/07/2020 1030   BUN 15 06/02/2021 1340   BUN 12 10/07/2020 1030   CREATININE 1.02 06/02/2021 1340   CREATININE 1.08 10/08/2012 1018      Component Value Date/Time    CALCIUM 9.2 10/07/2020 1030   ALKPHOS 53 10/07/2020 1030  AST 23 10/07/2020 1030   AST 18 09/18/2019 1030   ALT 18 10/07/2020 1030   ALT 13 09/18/2019 1030   BILITOT 0.3 10/07/2020 1030   BILITOT 0.4 09/18/2019 1030       RADIOGRAPHIC STUDIES: I have personally reviewed the radiological images as listed and agreed with the findings in the report. MR TOTAL SPINE METS SCREENING  Result Date: 08/08/2021 CLINICAL DATA:  Neoplasm, assess treatment response. EXAM: MRI TOTAL SPINE WITHOUT AND WITH CONTRAST TECHNIQUE: Multisequence MR imaging of the spine from the cervical spine to the sacrum was performed prior to and following IV contrast administration for evaluation of spinal metastatic disease. CONTRAST:  35mL GADAVIST GADOBUTROL 1 MMOL/ML IV SOLN COMPARISON:  None Available. FINDINGS: MRI CERVICAL SPINE FINDINGS Alignment: Physiologic Vertebrae: No evidence of bone lesion or incidental fracture. 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 No cord edema or abnormal intrathecal enhancement. 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 Disc levels: Generalized disc space narrowing and bulging with ridging. No cord impingement MRI THORACIC SPINE FINDINGS Alignment: Mild exaggerated thoracic kyphosis. Negative for listhesis. Vertebrae: Marrow replacing lesion in the T5 body, left eccentric where there is greater posterior element extension and where there is extraosseous extension into the T4-5 more than T5-6 foramina. Some altered marrow signal in the left T4 body and articular process which could be reactive edema or tumor. Cord: Worsening of the syrinx measuring up to 5 mm in thickness with compression of the cord at the level of T5-6 from behind. Expanded dorsal CSF intensity space without any enhancing mass seen posteriorly. There is some dorsal epidural venous plexus type smooth  enhancement which is accentuated on sagittal fat saturated postcontrast imaging. On the same sequence nodular enhancement is seen along the lower cord at the level of T11 and T12 with nodules measuring up to 6 mm at the level of T12. Paraspinal and other soft tissues: Pulmonary nodules in the right lung measuring up to 1 cm on series 34 Disc levels: No significant degenerative change MRI LUMBAR SPINE FINDINGS Segmentation:  5 lumbar type vertebrae Alignment:  Normal Vertebrae:  No evidence of bony metastasis Conus medullaris: Extends to the L2 level and appears normal. Nodularity along the cauda equina with enhancement compatible with leptomeningeal spread of tumor. The largest nodule is seen at the L2-3 disc space and measures 8 mm. Paraspinal and other soft tissues: Negative Disc levels: No degenerative impingement. This case is slated to be reviewed at tumor board this morning. IMPRESSION: 1. Known T5 body metastasis which is left eccentric with T4-5 more than T5-6 foraminal infiltration. Reactive edema versus infiltration into the T4 vertebra. 2. 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 3. 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 No visible enhancing metastatic disease at this level, question some tethering from the T5 vertebral metastasis or leptomeningeal adhesion. Electronically Signed   By: Jorje Guild M.D.   On: 08/08/2021 04:55    All questions were answered. The patient knows to call the clinic with any problems, questions or concerns. I spent 14 minutes in the care of this patient including H and P, review of records, counseling and coordination of care.     Benay Pike, MD 08/17/2021 2:35 PM

## 2021-08-17 NOTE — Progress Notes (Signed)
Palliative Medicine [facility]  Telephone:([phone]) [phone] Fax:(336) [phone]   Name: YAQOOB KAZI Date: 08/17/2021 MRN: [id number]  DOB: 05/30/1945  Patient Care Team: [name], FNP as PCP - General (Nurse Practitioner) West Bali, MD (Inactive) as Consulting Physician (Gastroenterology) Lonie Peak, MD as Attending Physician (Radiation Oncology) Barrie Folk, RN (Inactive) as Oncology Nurse Navigator Newman Pies, MD as Consulting Physician (Otolaryngology) Isaiah Blakes, LCSW as [facility] Care Management (Licensed Clinical Social Worker) Marletta Lor, Hennie Duos, DO as Consulting Physician (Internal Medicine) Pickenpack-Cousar, Arty Baumgartner, NP as Nurse Practitioner (Nurse Practitioner)   I connected with Mosie Epstein on 08/17/21 at  2:30 PM EDT by Phone and verified that I am speaking with the correct person using two identifiers.   I discussed the limitations, risks, security and privacy concerns of performing an evaluation and management service by telemedicine and the availability of in-person appointments. I also discussed with the patient that there may be a patient responsible charge related to this service. The patient expressed understanding and agreed to proceed.   Other persons participating in the visit and their role in the encounter: Alma Foddrell (Significant Other)    Patient's location: Home   Provider's location: [facility]    INTERVAL HISTORY: Logan Price is a 76 y.o. 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 No further radiation interventions available. Pending further Oncology discussions. Palliative ask to see for symptom management and goals of care.   SOCIAL HISTORY:     reports that he quit smoking about  14 years ago. His smoking use included cigarettes. He has a 40.00 pack-year smoking history. He has never used smokeless tobacco. He reports that he does not currently use alcohol. He reports that he does not use drugs.  ADVANCE DIRECTIVES:  Patient does not have an advanced directive.  He expressed that Aniceto Boss will be considered his medical decision-maker in the event of emergency.  I discussed at length in the absence of healthcare power of attorney documentation that his children would legally be considered his next of kin.  MOLST form completed 08/10/21.   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  MEDICATIONS:  Current Outpatient Medications  Medication Sig Dispense Refill   acetaminophen (TYLENOL) 650 MG CR tablet Take 650 mg by mouth every 8 (eight) hours as needed for pain.     benzonatate (TESSALON PERLES) 100 MG capsule Take 1 capsule (100 mg total) by mouth 3 (three) times daily as needed. (Patient not taking: Reported on 08/10/2021) 20 capsule 0   dexamethasone (DECADRON) 4 MG tablet Take one tablet daily with breakfast. 30 tablet 0   doxazosin (CARDURA) 1 MG tablet Place 1 tablet (1 mg total) into feeding tube daily. (Patient taking differently: Take 1 mg by mouth daily.) 30 tablet 5   ondansetron (ZOFRAN) 4 MG tablet Take 1 tablet (4 mg total) by mouth every 8 (  eight) hours as needed for nausea or vomiting. (Patient not taking: Reported on 08/10/2021) 20 tablet 0   pantoprazole (PROTONIX) 40 MG tablet TAKE ONE TABLET DAILY 30 MINUTES PRIOR TO THE FIRST MEAL 90 tablet 0   simvastatin (ZOCOR) 20 MG tablet TAKE 1 TABLET DAILY AT 6PM 90 tablet 1   No current  facility-administered medications for this visit.    VITAL SIGNS: There were no vitals taken for this visit. There were no vitals filed for this visit.  Estimated body mass index is 23.73 kg/m as calculated from the following:   Height as of 08/12/21: 5\' 9"  (1.753 m).   Weight as of 08/12/21: 160 lb 11.2 oz (72.9 kg).   PERFORMANCE STATUS (ECOG) : 2 - Symptomatic, <50% confined to bed    IMPRESSION: I connected with Mr. Brozek by phone. His wife was also present. Denies any acute distress. Reports he just returned home from running errands. Some weakness.   Mr. Kirchoff continues to take things one day at a time.   Neoplasm related pain Continues to endorse ongoing back pain. Taking Tylenol ES helps some but does not resolve the pain. He remains adamant that he is not interested in taking anything stronger at this time due to interfering with his ability to function, drive, and do the things he needs to get done. Ongoing education provided on options for additional medications to provide better control. He expresses once he is at the point of willingness to take something additional he will contact our office.   We will continue to support and monitor.   Ms. Aniceto Boss has shared that she is not ready to discuss anything that is "bad news". She is not ready for Mr. Belone to leave her and expresses she does not want to get worked up or for her blood pressure to go up thinking about the inevitable at this time. Emotional support provided.   They are remaining hopeful he will continue to do as well as expected for as long as he can. He is not going to take the drive to [place] to see his family. He shares they are all planning to come down to visit him in early July which he is looking forward to.   I discussed the importance of continued conversation with family and their medical providers regarding overall plan of care and treatment options, ensuring decisions are within the context of the patients  values and GOCs.  PLAN: DNR/DNI Ongoing goals of care discussions. Will continue to provide support.  Ongoing back pain. Currently taking Tylenol ES. Declines Tramadol or anything stronger as he does not wish to be drowsy or impaired preventing him from driving or being as active as he can for as long as he can.  Patient and wife expresses goals are to continue taking things day by day.  I will plan to have follow-up phone call in 2 weeks in collaboration with Oncology appointments.    Patient expressed understanding and was in agreement with this plan. He also understands that He can call the clinic at any time with any questions, concerns, or complaints.    Time Total: 25 min  Visit consisted of counseling and education dealing with the complex and emotionally intense issues of symptom management and palliative care in the setting of serious and potentially life-threatening illness.Greater than 50%  of this time was spent counseling and coordinating care related to the above assessment and plan.  Signed by: Willette Alma, AGPCNP-BC Palliative Medicine Team/[facility]

## 2021-08-18 ENCOUNTER — Ambulatory Visit (HOSPITAL_COMMUNITY): Payer: Medicare Other

## 2021-08-18 ENCOUNTER — Encounter (HOSPITAL_COMMUNITY): Payer: Self-pay

## 2021-08-18 DIAGNOSIS — M6281 Muscle weakness (generalized): Secondary | ICD-10-CM | POA: Diagnosis not present

## 2021-08-18 DIAGNOSIS — M545 Low back pain, unspecified: Secondary | ICD-10-CM | POA: Diagnosis not present

## 2021-08-18 DIAGNOSIS — R2689 Other abnormalities of gait and mobility: Secondary | ICD-10-CM

## 2021-08-18 DIAGNOSIS — G8929 Other chronic pain: Secondary | ICD-10-CM | POA: Diagnosis not present

## 2021-08-18 NOTE — Therapy (Signed)
OUTPATIENT PHYSICAL THERAPY TREATMENT NOTE   Patient Name: Logan Price MRN: [id number] DOB:Jan 24, 1946, 75 y.o., male Today's Date: 08/18/2021  PCP: [name], FNP REFERRING PROVIDER: [name], FNP  END OF SESSION:   PT End of Session - 08/18/21 1140     Visit Number 5    Number of Visits 12    Authorization Type Primary: Medicare Secondary BCBS (for hospital only)( no coverage for PT)    Progress Note Due on Visit 10    PT Start Time 1135    PT Stop Time 1215    PT Time Calculation (min) 40 min    Activity Tolerance Patient tolerated treatment well;Patient limited by pain;No increased pain;Patient limited by fatigue    Behavior During Therapy [facility] for tasks assessed/performed             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   HTN (hypertension)    Hyperlipidemia    Past Surgical History:  Procedure Laterality Date   BIOPSY  05/04/2015   Procedure: BIOPSY;  Surgeon: West Bali, MD;  [facility];  Service: Endoscopy;;  bx's of ileocecal valve    BIOPSY  06/22/2020   Procedure: BIOPSY;  Surgeon: Lanelle Bal, DO;  [facility];  Service: Endoscopy;;   BRONCHIAL BRUSHINGS  04/29/2019   Procedure: BRONCHIAL BRUSHINGS;  Surgeon: Josephine Igo, DO;  [facility];  Service: Pulmonary;;   BRONCHIAL NEEDLE ASPIRATION BIOPSY  04/29/2019   Procedure: BRONCHIAL NEEDLE ASPIRATION BIOPSIES;  Surgeon: Josephine Igo, DO;  [facility]: [facility];  Service: Pulmonary;;   BRONCHIAL WASHINGS  04/29/2019   Procedure: BRONCHIAL WASHINGS;  Surgeon: Josephine Igo, DO;  [facility];  Service: Pulmonary;;   COLONOSCOPY WITH PROPOFOL N/A 05/04/2015   Dr. Darrick Penna: normal appearing ileum with prominent IC valve with  tubular adenomas, moderate diverticulosis in sigmoid colon, ascending colon, and retum. Moderate sized internal hemorrhoids. Surveillance in 5 years   COLONOSCOPY WITH PROPOFOL N/A 06/22/2020   Procedure: COLONOSCOPY WITH PROPOFOL;  Surgeon: Lanelle Bal, DO;  [facility];  Service: Endoscopy;  Laterality: N/A;  PM   ELECTROMAGNETIC NAVIGATION BROCHOSCOPY  04/29/2019   Procedure: NAVIGATION BRONCHOSCOPY;  Surgeon: Josephine Igo, DO;  [facility];  Service: Pulmonary;;   ESOPHAGOGASTRODUODENOSCOPY (EGD) WITH PROPOFOL N/A 12/19/2016   Procedure: ESOPHAGOGASTRODUODENOSCOPY (EGD) WITH PROPOFOL;  Surgeon: West Bali, MD;  [facility];  Service: Endoscopy;  Laterality: N/A;  11:30am   FLEXIBLE SIGMOIDOSCOPY N/A 12/10/2015   hemorrhoid banding X 3    HEMORRHOID BANDING N/A 12/10/2015   Procedure: HEMORRHOID BANDING;  Surgeon: West Bali, MD;  [facility];  Service: Endoscopy;  Laterality: N/A;  1:30 PM   INSERTION OF SUPRAPUBIC CATHETER N/A 02/24/2019   Procedure: INSERTION OF SUPRAPUBIC CATHETER;  Surgeon: Ihor Gully, MD;  [facility];  Service: Urology;  Laterality: N/A;   IR CM INJ ANY COLONIC TUBE W/FLUORO  10/30/2018   IR GASTROSTOMY TUBE MOD SED  10/04/2018   IR GASTROSTOMY TUBE REMOVAL  10/08/2019   IR IMAGING GUIDED PORT INSERTION  10/04/2018   IR REMOVAL TUN ACCESS W/ PORT W/O FL MOD SED  10/14/2018   IR REPLACE  G-TUBE SIMPLE WO FLUORO  11/03/2018   IR REPLACE G-TUBE SIMPLE WO FLUORO  07/28/2019   LAPAROSCOPIC INSERTION GASTROSTOMY TUBE Left 10/07/2018   Procedure: LAPAROSCOPIC  GASTROSTOMY TUBE;  Surgeon: Sheliah Hatch De Blanch, MD;  [facility];  Service: General;  Laterality: Left;   MICROLARYNGOSCOPY N/A 09/27/2018   Procedure: MICRO DIRECT LARYNGOSCOPY WITH BIOPSY;  Surgeon: Newman Pies, MD;  Location: [facility];  Service: ENT;  Laterality: N/A;   None to date     As of 04/14/15   POLYPECTOMY  05/04/2015   Procedure: POLYPECTOMY;   Surgeon: West Bali, MD;  [facility];  Service: Endoscopy;;  descending colon polyp, ascending colon polyp   SAVORY DILATION N/A 12/19/2016   Procedure: SAVORY DILATION;  Surgeon: West Bali, MD;  [facility];  Service: Endoscopy;  Laterality: N/A;   TRACHEOSTOMY TUBE PLACEMENT N/A 09/27/2018   Procedure: AWAKE TRACHEOSTOMY;  Surgeon: Newman Pies, MD;  [facility];  Service: ENT;  Laterality: N/A;   TRANSURETHRAL RESECTION OF PROSTATE N/A 02/24/2019   Procedure: TRANSURETHRAL RESECTION OF THE PROSTATE (TURP);  Surgeon: Ihor Gully, MD;  [facility];  Service: Urology;  Laterality: N/A;   VIDEO BRONCHOSCOPY WITH ENDOBRONCHIAL NAVIGATION N/A 04/29/2019   Procedure: VIDEO BRONCHOSCOPY;  Surgeon: Josephine Igo, DO;  [facility];  Service: Pulmonary;  Laterality: N/A;   Patient Active Problem List   Diagnosis Date Noted   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   Malignant 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) 06/20/2021   Malignant neoplasm metastatic to bone (HCC) 06/08/2021   Neck mass 02/15/2021   Low back pain 03/10/2020   Malignant neoplasm of right upper lobe of lung (HCC) 05/27/2019   Primary cancer of left upper lobe of lung (HCC) 05/27/2019   Tracheobronchitis 05/21/2019   Hx of radiation therapy 05/21/2019   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HCC) 05/21/2019   Squamous cell lung cancer, right (HCC) 05/21/2019   Adenocarcinoma, lung, left (HCC) 05/21/2019   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   Pleural effusion    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HCC)    Normocytic anemia    PSVT (paroxysmal supraventricular tachycardia) (HCC) 10/03/2018   Atrial fibrillation (HCC) 10/03/2018   Dysphagia    Loss of weight    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   Gastritis determined by endoscopy    Esophageal dysphagia    False positive serological test for hepatitis C 12/13/2016   Globus sensation 12/12/2016   Smokes with greater than 40 pack year  history 10/20/2016   Hemorrhoids, internal, with bleeding 10/06/2015   History of colonic polyps 04/14/2015   Vitamin D deficiency 10/09/2014   HLD (hyperlipidemia) 10/08/2012   GERD (gastroesophageal reflux disease) 10/08/2012   HTN (hypertension)    Diverticulitis 10/25/2009    REFERRING DIAG: R53.1 (ICD-10-CM) - Weakness   THERAPY DIAG:  Muscle weakness (generalized)  Other abnormalities of gait and mobility  Chronic bilateral low back pain, unspecified whether sciatica present  PERTINENT HISTORY:   PRECAUTIONS: Muscle weakness (generalized)   Other abnormalities of gait and mobility  SUBJECTIVE: Pt stated he is in constant pain LBP and Bil legs, pain scale 5-6/10   PAIN:  Are you having pain? Yes: NPRS scale: 5/10 Pain location: LBP Pain description: sore Aggravating factors: walking Relieving factors: sitting calms down. Onset date: 3/23  OBJECTIVE: (objective measures completed at initial evaluation unless otherwise dated)   OBJECTIVE:    DIAGNOSTIC FINDINGS:  PET scan 4/13 IMPRESSION: 1. New destructive  T5 vertebral body lesion is hypermetabolic and consistent with metastatic disease. Spinal canal involvement is noted. Recommend MRI thoracic spine without and with contrast for further evaluation and consideration for radiation treatment. 2. Stable post treatment changes involving both lungs. No new or progressive findings. Stable bilateral pulmonary nodules. 3. Stable small mildly hypermetabolic right hilar node. 4. No findings for abdominal/pelvic metastatic disease.   MRI 06/15/21 IMPRESSION: Positive for 5 brain metastases, the largest measuring 2.1 cm in the right cerebellum where there is vasogenic edema with local mass effect. No hydrocephalus.   Observation: Ambulates with RW, crouched posture, unsteady, trunk flexed   COGNITION:           Overall cognitive status: Within functional limits for tasks assessed                           SENSATION: WFL     POSTURE:  Trunk flexed   LE ROM: decreased hip extension bilaterally   Active ROM Right 07/18/2021 Left 07/18/2021 Left 07/28/21 Right 07/28/21  Hip flexion        Hip extension        Hip abduction        Hip adduction        Hip internal rotation        Hip external rotation        Knee flexion        Knee extension     23 lacking 18 degrees lacking  Ankle dorsiflexion        Ankle plantarflexion        Ankle inversion        Ankle eversion         (Blank rows = not tested)   LE MMT:   MMT Right 07/18/2021 Left 07/18/2021  Hip flexion 4/5 4/5  Hip extension      Hip abduction      Hip adduction      Hip internal rotation      Hip external rotation      Knee flexion 5/5 4-/5  Knee extension 5/5  4/5  Ankle dorsiflexion 5/5 5/5  Ankle plantarflexion      Ankle inversion      Ankle eversion       (Blank rows = not tested)       FUNCTIONAL TESTS:  Dynamic Gait Index: 6   [facility] PT Assessment - 07/18/21 0001                Standardized Balance Assessment    Standardized Balance Assessment Dynamic Gait Index          Dynamic Gait Index    Level Surface Moderate Impairment     Change in Gait Speed Moderate Impairment     Gait with Horizontal Head Turns Moderate Impairment     Gait with Vertical Head Turns Moderate Impairment     Gait and Pivot Turn Moderate Impairment     Step Over Obstacle Severe Impairment     Step Around Obstacles Severe Impairment     Steps Moderate Impairment     Total Score 6     DGI comment: high risk for falls                     GAIT: Distance walked: 100 feet Assistive device utilized: Walker - 2 wheeled Level of assistance: CGA and Min A Comments: slow, unsteady, trunk flexed with crouched gait, LLE buckling,  poor foot clearance       TODAY'S TREATMENT:  08/18/21  Nustep 5 mins level 5  Prone:    POE 3 min   Knee hang   TKE 10x5"   Knee flexion 10   Supine:   Bridge 10x 2sets   SAQ 10x  5" holds  Seated: LAQ 10x 5"   STS elevated height, required 1 HHA 10x  Standing: TKE 10 x5"   Gait training to improve stride length and heel contact   08/16/21  Nustep 5 mins level 5  Hamstring curl x3 plates x3 mins  Knee extensions x1 plate 9U04  Sit to stand with UE support x7   Chair squats with hands on therapist shoulder x5  Standing marches with RW, cues for knee extension 3x10         08/02/21 Supine Bridge 3X10  SLR 2X10 each LE  SAQ 2X10 with 5" holds Sidelying hip abduction 2X10 each  Clams 2X10 with 5" holds Seated LAQ 10X5" holds    07/28/21: Reviewed goals Supine:  Quad sets 10x 5"  Bridge  SLR with quad sets Sidelying: clam  07/18/21 Alternating march 1x 10 5 seconds holds LAQ 1x 10 5 second holds  Seated HR/TR 1x 15     PATIENT EDUCATION:  Education details: 07/28/21:  Reviewed goals, educated importance of HEP Compliance for maximal benefits, pt able to recall seated exercise and complete correctly.   Eval:Patient educated on exam findings, POC, scope of PT, HEP, and compliance with HEP. Person educated: Patient Education method: Explanation, Demonstration, and Handouts Education comprehension: verbalized understanding, returned demonstration, verbal cues required, and tactile cues required       HOME EXERCISE PROGRAM: 08/18/21: POE Bridge Quad sets  08/16/21 Access Code: [email or web address] URL: [email or web address] Date: 08/16/2021 Prepared by: Aleatha Borer  Exercises - Sit to Stand with Counter Support  - 2 x daily - 7 x weekly - 2 sets - 10 reps - Standing March with Counter Support  - 2 x daily - 7 x weekly - 3 sets - 10 reps - Standing Hip Abduction with Unilateral Counter Support  - 2 x daily - 7 x weekly - 4 sets - 5 reps  07/18/21 Access Code: [facility] Exercises - Seated March  - 5 x daily - 7 x weekly - 2 sets - 10 reps - 5 second hold - Seated Long Arc Quad  - 5 x daily - 7 x weekly - 2 sets - 10 reps - 5 second  -  Seated Heel Toe Raises  - 5 x daily - 7 x weekly - 2 sets - 10 reps - 5 second    ASSESSMENT:   CLINICAL IMPRESSION:  Began on Nustep for dynamic warm up per pt.'s request.  Session focus on LE strengthening and lumbar mobility.  Added prone knee hang and TKE based exercises to address quad weakness and improve knee extension during gait.  Pt required frequent rest breaks due to fatigue.  Pt presents with improved knee extension with gait mechanics at EOS, cueing for increased stride length and heel strike to improve mechanics.  Pt stated POE was helpful with back.  Added additional exercises to HEP.   OBJECTIVE IMPAIRMENTS Abnormal gait, cardiopulmonary status limiting activity, decreased activity tolerance, decreased balance, decreased endurance, decreased knowledge of use of DME, decreased mobility, difficulty walking, decreased ROM, decreased strength, decreased safety awareness, impaired flexibility, improper body mechanics, postural dysfunction, and pain.    ACTIVITY LIMITATIONS cleaning, community activity, driving, meal  prep, occupation, laundry, yard work, and shopping.    PERSONAL FACTORS Age, Fitness, Time since onset of injury/illness/exacerbation, and 3+ comorbidities: Chronic back pain, cancer with mets, sedentary  are also affecting patient's functional outcome.      REHAB POTENTIAL: Good   CLINICAL DECISION MAKING: Evolving/moderate complexity   EVALUATION COMPLEXITY: Moderate     GOALS: Goals reviewed with patient? Yes   SHORT TERM GOALS: Target date: 08/08/2021   Patient will be independent with HEP in order to improve functional outcomes. Baseline:  Goal status: Ongoing   2.  Patient will report at least 25% improvement in symptoms for improved quality of life. Baseline:  Goal status: Ongoing       LONG TERM GOALS: Target date: 08/29/2021   Patient will report at least 75% improvement in symptoms for improved quality of life. Baseline:  Goal status: Ongoing    2.  Patient will be able to complete 5x STS in under 20 seconds in order to reduce the risk of falls. Baseline:  Goal status: Ongoing   3.  Patient will be able to ambulate at least 226 feet in in order to demonstrate improved gait speed for community ambulation.  Baseline:  Goal status: Ongoing   4.  Patient will score at least 14/24 on DGI in order to demonstrate improved balance to reduce the risk for falls at home and in the community.  Baseline: 6/24 Goal status: Ongoing             PLAN: PT FREQUENCY: 2x/week   PT DURATION: 6 weeks   PLANNED INTERVENTIONS: Therapeutic exercises, Therapeutic activity, Neuromuscular re-education, Balance training, Gait training, Patient/Family education, Joint manipulation, Joint mobilization, Stair training, Orthotic/Fit training, DME instructions, Aquatic Therapy, Dry Needling, Electrical stimulation, Spinal manipulation, Spinal mobilization, Cryotherapy, Moist heat, Compression bandaging, scar mobilization, Splintting, Taping, Traction, Ultrasound, Ionotophoresis 4mg /ml Dexamethasone, and Manual therapy     PLAN FOR NEXT SESSION: strength, gait, balance training   Becky Sax, LPTA/CLT; CBIS [phone]  Juel Burrow, PTA 08/18/2021, 1:08 PM

## 2021-08-23 ENCOUNTER — Ambulatory Visit (HOSPITAL_COMMUNITY): Payer: Medicare Other

## 2021-08-23 ENCOUNTER — Telehealth: Payer: Self-pay

## 2021-08-23 NOTE — Progress Notes (Incomplete)
Mr. Ickes presents today for follow-up and discussion of possible palliative treatment to lower spine.   Pain:  Balance:  Ambulatory status:  Numbness/weakness: Other issues of note: Meeting with Palliative Care provider later this morning

## 2021-08-23 NOTE — Telephone Encounter (Signed)
Called pt and family to confirm tomorrow's appointment, verbalized agreement.

## 2021-08-23 NOTE — Telephone Encounter (Signed)
Returned pt's call, verified identity. Pt. complaining of weakness in bilateral legs that is worsening. Pt. Would like to discuss this directly w/ Dr. Isidore Moos. I assured pt. that we would do everything we could to help and to give [place] until the end of the business day today-08/23/21 to return the call w/ a solution. Pt. verbalized understanding.   Patient preferred contact [phone].

## 2021-08-24 ENCOUNTER — Encounter: Payer: Self-pay | Admitting: Radiation Oncology

## 2021-08-24 ENCOUNTER — Inpatient Hospital Stay: Payer: Medicare Other | Admitting: Nurse Practitioner

## 2021-08-24 ENCOUNTER — Ambulatory Visit
Admission: RE | Admit: 2021-08-24 | Discharge: 2021-08-24 | Disposition: A | Discharge status: Home / Self Care | Payer: Medicare Other | Source: Ambulatory Visit | Attending: Radiation Oncology | Admitting: Radiation Oncology

## 2021-08-24 ENCOUNTER — Other Ambulatory Visit: Payer: Self-pay

## 2021-08-24 VITALS — BP 150/67 | HR 89 | Temp 97.6°F | Resp 20 | Wt 163.0 lb

## 2021-08-24 DIAGNOSIS — C320 Malignant neoplasm of glottis: Secondary | ICD-10-CM | POA: Insufficient documentation

## 2021-08-24 DIAGNOSIS — C7931 Secondary malignant neoplasm of brain: Secondary | ICD-10-CM | POA: Insufficient documentation

## 2021-08-24 DIAGNOSIS — E785 Hyperlipidemia, unspecified: Secondary | ICD-10-CM

## 2021-08-24 DIAGNOSIS — Z510 Encounter for antineoplastic radiation therapy: Secondary | ICD-10-CM | POA: Insufficient documentation

## 2021-08-24 DIAGNOSIS — C7951 Secondary malignant neoplasm of bone: Secondary | ICD-10-CM | POA: Insufficient documentation

## 2021-08-24 DIAGNOSIS — C3492 Malignant neoplasm of unspecified part of left bronchus or lung: Secondary | ICD-10-CM

## 2021-08-24 DIAGNOSIS — C7800 Secondary malignant neoplasm of unspecified lung: Secondary | ICD-10-CM | POA: Diagnosis not present

## 2021-08-24 DIAGNOSIS — C7949 Secondary malignant neoplasm of other parts of nervous system: Secondary | ICD-10-CM | POA: Insufficient documentation

## 2021-08-24 DIAGNOSIS — I10 Essential (primary) hypertension: Secondary | ICD-10-CM

## 2021-08-24 DIAGNOSIS — I4891 Unspecified atrial fibrillation: Secondary | ICD-10-CM

## 2021-08-24 DIAGNOSIS — C720 Malignant neoplasm of spinal cord: Secondary | ICD-10-CM

## 2021-08-24 MED ORDER — GABAPENTIN 300 MG PO CAPS: ORAL_CAPSULE | ORAL | 0 refills | Status: AC

## 2021-08-24 NOTE — Progress Notes (Addendum)
Logan Price presents today for follow-up and discussion of possible palliative treatment to lower spine.   Pain: Pain in his legs. Balance: Unsteady gait Ambulatory status: Walk with walker Numbness/weakness:none Other issues of note: Meeting with Palliative Care provider later this morning  Vitals:   08/24/21 0807  BP: (!) 150/67  Pulse: 89  Resp: 20  Temp: 97.6 F (36.4 C)  SpO2: 98%  Weight: 73.9 kg

## 2021-08-25 ENCOUNTER — Encounter: Payer: Self-pay | Admitting: Radiation Oncology

## 2021-08-25 ENCOUNTER — Encounter (HOSPITAL_COMMUNITY): Payer: Medicare Other | Admitting: Physical Therapy

## 2021-08-25 NOTE — Progress Notes (Signed)
Radiation Oncology         [phone] ________________________________  Name: SHLOIMY BOREN MRN: [id number]  Date: 08/10/2021  DOB: June 24, 1945  Follow-Up Visit Note in person  CC: [name], FNP  [name], FNP  Diagnosis and Prior Radiotherapy:        ICD-10-CM   1. 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 Malignant neoplasm metastatic to bone (HCC)  C79.51 gabapentin (NEURONTIN) 300 MG capsule       Cancer Staging  Squamous cell carcinoma of glottis (HCC) Staging form: Larynx - Glottis, AJCC 8th Edition - Clinical stage from 10/09/2018: Stage IVB (cT4b, cN3b, cM0) - Signed by Lonie Peak, MD on 10/09/2018 Now with METASTATIC DISEASE TO LUNG, BONE + BRAIN   Radiation Treatment Dates: 10/22/2018 through 12/10/2018 Site Technique Total Dose Dose per Fx Completed Fx Beam Energies  Head & neck: HN_larynx IMRT 70/70 2 35/35 6X   Radiation Treatment Dates: 06/03/2019 through 06/13/2019 Site Technique Total Dose (Gy) Dose per Fx (Gy) Completed Fx Beam Energies  Lung, Right: Lung_Rt_Upper IMRT 60/60 12 5/5 6XFFF  Lung, Left: Lung_Lt_Upper IMRT 60/60 12 5/5 6XFFF   Radiation Treatment Dates: 01/18/2021 through 01/28/2021 Site Technique Total Dose (Gy) Dose per Fx (Gy) Completed Fx Beam Energies  Lung, Right: Lung_Rt_Upper IMRT 60/60 12 5/5 6XFFF   Radiation Treatment Dates: 06/13/2021 through 06/24/2021 Site Technique Total Dose (Gy) Dose per Fx (Gy) Completed Fx Beam Energies  Brain: Brain_SRS IMRT 18/18 18 1/1 6XFFF  Thoracic Spine: Spine_T4-T6 3D 30/30 3 10/10 10X   CHIEF COMPLAINT:  Here for follow-up and surveillance of cancer   Narrative:    Mr Rudy presents today with his significant other. Taking Dexamethasone 4mg  daily and tylenol PRN. Low back pain is worsening, radiating b/l down groin and upper 2/3 of thighs.  Ambulating is more difficult, too.  No numbness or incontinence or retention re: bowel/bladder function.   Pain: Pain in his legs. Balance: Unsteady  gait Ambulatory status: Walk with walker Numbness/weakness:none Other issues of note: Meeting with Palliative Care provider later this morning  Vitals:   08/24/21 0807  BP: (!) 150/67  Pulse: 89  Resp: 20  Temp: 97.6 F (36.4 C)  SpO2: 98%  Weight: 163 lb (73.9 kg)     ALLERGIES:  has No Known Allergies.  Meds:  Current Outpatient Medications on File Prior to Encounter  Medication Sig Dispense Refill   acetaminophen (TYLENOL) 650 MG CR tablet Take 650 mg by mouth every 8 (eight) hours as needed for pain.     dexamethasone (DECADRON) 4 MG tablet Take one tablet daily with breakfast. 30 tablet 0   doxazosin (CARDURA) 1 MG tablet Place 1 tablet (1 mg total) into feeding tube daily. (Patient taking differently: Take 1 mg by mouth daily.) 30 tablet 5   pantoprazole (PROTONIX) 40 MG tablet TAKE ONE TABLET DAILY 30 MINUTES PRIOR TO THE FIRST MEAL 90 tablet 0   simvastatin (ZOCOR) 20 MG tablet TAKE 1 TABLET DAILY AT 6PM 90 tablet 1   benzonatate (TESSALON PERLES) 100 MG capsule Take 1 capsule (100 mg total) by mouth 3 (three) times daily as needed. (Patient not taking: Reported on 08/10/2021) 20 capsule 0   ondansetron (ZOFRAN) 4 MG tablet Take 1 tablet (4 mg total) by mouth every 8 (eight) hours as needed for nausea or vomiting. (Patient not taking: Reported on 08/10/2021) 20 tablet 0   No current facility-administered medications on file prior to encounter.  Physical Findings: The patient is in no acute distress. Patient is alert and oriented. Wt Readings from Last 3 Encounters:  08/10/21 159 lb (72.1 kg)  07/14/21 160 lb 8 oz (72.8 kg)  06/23/21 163 lb 1.6 oz (74 kg)   height is 5\' 9"  (1.753 m) and weight is 159 lb (72.1 kg). His temporal temperature is 97.6 F (36.4 C). His blood pressure is 159/62 (abnormal) and his pulse is 69. His respiration is 18 and oxygen saturation is 98%. .  General: Alert and oriented, in no acute distress.  HEENT: no thrush MSK: strength is  symmetric; decreased bilateral strength per hip flexion.  Presents in a wheelchair.   Neuro: no numbness in extremities  Lab Findings: Lab Results  Component Value Date   WBC 4.4 10/07/2020   HGB 12.9 (L) 10/07/2020   HCT 39.5 10/07/2020   MCV 91 10/07/2020   PLT 185 10/07/2020    Lab Results  Component Value Date   TSH 3.886 03/04/2021    Radiographic Findings:   MR TOTAL SPINE METS SCREENING  Result Date: 08/08/2021 CLINICAL DATA:  Neoplasm, assess treatment response. EXAM: MRI TOTAL SPINE WITHOUT AND WITH CONTRAST TECHNIQUE: Multisequence MR imaging of the spine from the cervical spine to the sacrum was performed prior to and following IV contrast administration for evaluation of spinal metastatic disease. CONTRAST:  7mL GADAVIST GADOBUTROL 1 MMOL/ML IV SOLN COMPARISON:  None Available. FINDINGS: MRI CERVICAL SPINE FINDINGS Alignment: Physiologic Vertebrae: No evidence of bone lesion or incidental fracture. 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 No cord edema or abnormal intrathecal enhancement. 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 Disc levels: Generalized disc space narrowing and bulging with ridging. No cord impingement MRI THORACIC SPINE FINDINGS Alignment: Mild exaggerated thoracic kyphosis. Negative for listhesis. Vertebrae: Marrow replacing lesion in the T5 body, left eccentric where there is greater posterior element extension and where there is extraosseous extension into the T4-5 more than T5-6 foramina. Some altered marrow signal in the left T4 body and articular process which could be reactive edema or tumor. Cord: Worsening of the syrinx measuring up to 5 mm in thickness with compression of the cord at the level of T5-6 from behind. Expanded dorsal CSF intensity space without any enhancing mass seen posteriorly. There is some dorsal epidural  venous plexus type smooth enhancement which is accentuated on sagittal fat saturated postcontrast imaging. On the same sequence nodular enhancement is seen along the lower cord at the level of T11 and T12 with nodules measuring up to 6 mm at the level of T12. Paraspinal and other soft tissues: Pulmonary nodules in the right lung measuring up to 1 cm on series 34 Disc levels: No significant degenerative change MRI LUMBAR SPINE FINDINGS Segmentation:  5 lumbar type vertebrae Alignment:  Normal Vertebrae:  No evidence of bony metastasis Conus medullaris: Extends to the L2 level and appears normal. Nodularity along the cauda equina with enhancement compatible with leptomeningeal spread of tumor. The largest nodule is seen at the L2-3 disc space and measures 8 mm. Paraspinal and other soft tissues: Negative Disc levels: No degenerative impingement. This case is slated to be reviewed at tumor board this morning. IMPRESSION: 1. Known T5 body metastasis which is left eccentric with T4-5 more than T5-6 foraminal infiltration. Reactive edema versus infiltration into the T4 vertebra. 2. 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 3. 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 No visible enhancing metastatic disease at this level, question some tethering from the T5 vertebral metastasis or leptomeningeal adhesion. Electronically Signed   By: Tiburcio Pea M.D.   On: 08/08/2021 04:55     Impression/Plan:    Disease status: This is a wonderful patient with h/o locally advanced larygeal cancer. 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 I explained the seriousness of this disease evolution.  Portion of brain that was imaged shows improvement in brain metastases s/p  SRS  [facility] does not recommend surgical intervention.    Med/onc does not recommend systemic therapy.   PLAN: continue tylenol and dexamethasone 4mg  daily   Continue palliative card visits.  I will start gabapentin 300mg  TID for his pain.  It is unclear if his low back pain is due to the cord tethering which is in upper T spine and has already been treated with RT, or the leptomeningeal disease in the lower spinal canal.  We discussed a 5 fraction course of palliative RT to T11-S1 where the most obvious L-M disease is.  He and his sig other would like to proceed. Will plan RT today and start Monday 6/5.  Risks include but are not necessarily limited to:  skin irritation, fatigue, GI upset, rare injury to internal organs.  Consent signed today.  2)  Thyroid function:  WNL   Lab Results  Component Value Date   TSH 3.886 03/04/2021     On date of service, in total, I spent 25 minutes on this encounter. Patient was seen in person. Note signed day after visit. Minutes above pertain to date of visit, only.  ___   Lonie Peak, MD

## 2021-08-26 ENCOUNTER — Telehealth: Payer: Self-pay | Admitting: Family

## 2021-08-26 DIAGNOSIS — Z510 Encounter for antineoplastic radiation therapy: Secondary | ICD-10-CM | POA: Diagnosis not present

## 2021-08-26 DIAGNOSIS — C7800 Secondary malignant neoplasm of unspecified lung: Secondary | ICD-10-CM | POA: Diagnosis not present

## 2021-08-26 DIAGNOSIS — C7949 Secondary malignant neoplasm of other parts of nervous system: Secondary | ICD-10-CM | POA: Insufficient documentation

## 2021-08-26 DIAGNOSIS — C320 Malignant neoplasm of glottis: Secondary | ICD-10-CM | POA: Insufficient documentation

## 2021-08-26 DIAGNOSIS — C7951 Secondary malignant neoplasm of bone: Secondary | ICD-10-CM | POA: Diagnosis not present

## 2021-08-26 DIAGNOSIS — C7931 Secondary malignant neoplasm of brain: Secondary | ICD-10-CM | POA: Diagnosis not present

## 2021-08-29 ENCOUNTER — Ambulatory Visit: Payer: Medicare Other | Admitting: Radiation Oncology

## 2021-08-29 ENCOUNTER — Ambulatory Visit: Payer: Medicare Other

## 2021-08-30 ENCOUNTER — Ambulatory Visit: Payer: Medicare Other

## 2021-08-30 ENCOUNTER — Ambulatory Visit (HOSPITAL_COMMUNITY): Payer: Medicare Other | Attending: Physical Therapy | Admitting: Physical Therapy

## 2021-08-30 ENCOUNTER — Other Ambulatory Visit: Payer: Self-pay

## 2021-08-30 ENCOUNTER — Other Ambulatory Visit: Payer: Self-pay | Admitting: Radiation Oncology

## 2021-08-30 ENCOUNTER — Ambulatory Visit
Admission: RE | Admit: 2021-08-30 | Discharge: 2021-08-30 | Disposition: A | Discharge status: Home / Self Care | Payer: Medicare Other | Source: Ambulatory Visit | Attending: Radiation Oncology | Admitting: Radiation Oncology

## 2021-08-30 ENCOUNTER — Inpatient Hospital Stay: Payer: Medicare Other | Attending: Radiation Oncology

## 2021-08-30 DIAGNOSIS — C7949 Secondary malignant neoplasm of other parts of nervous system: Secondary | ICD-10-CM | POA: Diagnosis not present

## 2021-08-30 DIAGNOSIS — C7951 Secondary malignant neoplasm of bone: Secondary | ICD-10-CM

## 2021-08-30 DIAGNOSIS — C7931 Secondary malignant neoplasm of brain: Secondary | ICD-10-CM

## 2021-08-30 DIAGNOSIS — C3412 Malignant neoplasm of upper lobe, left bronchus or lung: Secondary | ICD-10-CM | POA: Insufficient documentation

## 2021-08-30 DIAGNOSIS — Z87891 Personal history of nicotine dependence: Secondary | ICD-10-CM | POA: Insufficient documentation

## 2021-08-30 DIAGNOSIS — C7800 Secondary malignant neoplasm of unspecified lung: Secondary | ICD-10-CM | POA: Diagnosis not present

## 2021-08-30 DIAGNOSIS — C320 Malignant neoplasm of glottis: Secondary | ICD-10-CM | POA: Diagnosis not present

## 2021-08-30 DIAGNOSIS — Z923 Personal history of irradiation: Secondary | ICD-10-CM | POA: Insufficient documentation

## 2021-08-30 DIAGNOSIS — Z510 Encounter for antineoplastic radiation therapy: Secondary | ICD-10-CM | POA: Diagnosis not present

## 2021-08-30 DIAGNOSIS — G893 Neoplasm related pain (acute) (chronic): Secondary | ICD-10-CM | POA: Insufficient documentation

## 2021-08-30 LAB — RAD ONC ARIA SESSION SUMMARY
Course Elapsed Days: 0
Plan Fractions Treated to Date: 1
Plan Fractions Treated to Date: 1
Plan Prescribed Dose Per Fraction: 4 Gy
Plan Prescribed Dose Per Fraction: 4 Gy
Plan Total Fractions Prescribed: 5
Plan Total Fractions Prescribed: 5
Plan Total Prescribed Dose: 20 Gy
Plan Total Prescribed Dose: 20 Gy
Reference Point Dosage Given to Date: 4 Gy
Reference Point Dosage Given to Date: 4 Gy
Reference Point Session Dosage Given: 4 Gy
Reference Point Session Dosage Given: 4 Gy
Session Number: 1

## 2021-08-30 MED ORDER — DEXAMETHASONE 4 MG PO TABS: ORAL_TABLET | ORAL | 0 refills | Status: AC

## 2021-08-31 ENCOUNTER — Ambulatory Visit
Admission: RE | Admit: 2021-08-31 | Discharge: 2021-08-31 | Disposition: A | Discharge status: Home / Self Care | Payer: Medicare Other | Source: Ambulatory Visit | Attending: Radiation Oncology | Admitting: Radiation Oncology

## 2021-08-31 ENCOUNTER — Other Ambulatory Visit: Payer: Self-pay

## 2021-08-31 DIAGNOSIS — C7931 Secondary malignant neoplasm of brain: Secondary | ICD-10-CM | POA: Diagnosis not present

## 2021-08-31 DIAGNOSIS — Z510 Encounter for antineoplastic radiation therapy: Secondary | ICD-10-CM | POA: Diagnosis not present

## 2021-08-31 DIAGNOSIS — C320 Malignant neoplasm of glottis: Secondary | ICD-10-CM | POA: Diagnosis not present

## 2021-08-31 DIAGNOSIS — C7800 Secondary malignant neoplasm of unspecified lung: Secondary | ICD-10-CM | POA: Diagnosis not present

## 2021-08-31 DIAGNOSIS — C7951 Secondary malignant neoplasm of bone: Secondary | ICD-10-CM | POA: Diagnosis not present

## 2021-08-31 DIAGNOSIS — C7949 Secondary malignant neoplasm of other parts of nervous system: Secondary | ICD-10-CM | POA: Diagnosis not present

## 2021-08-31 LAB — RAD ONC ARIA SESSION SUMMARY
Course Elapsed Days: 1
Plan Fractions Treated to Date: 2
Plan Fractions Treated to Date: 2
Plan Prescribed Dose Per Fraction: 4 Gy
Plan Prescribed Dose Per Fraction: 4 Gy
Plan Total Fractions Prescribed: 5
Plan Total Fractions Prescribed: 5
Plan Total Prescribed Dose: 20 Gy
Plan Total Prescribed Dose: 20 Gy
Reference Point Dosage Given to Date: 8 Gy
Reference Point Dosage Given to Date: 8 Gy
Reference Point Session Dosage Given: 4 Gy
Reference Point Session Dosage Given: 4 Gy
Session Number: 2

## 2021-09-01 ENCOUNTER — Other Ambulatory Visit: Payer: Self-pay

## 2021-09-01 ENCOUNTER — Ambulatory Visit
Admission: RE | Admit: 2021-09-01 | Discharge: 2021-09-01 | Disposition: A | Discharge status: Home / Self Care | Payer: Medicare Other | Source: Ambulatory Visit | Attending: Radiation Oncology | Admitting: Radiation Oncology

## 2021-09-01 ENCOUNTER — Ambulatory Visit (HOSPITAL_COMMUNITY): Payer: Medicare Other | Attending: Physical Therapy | Admitting: Physical Therapy

## 2021-09-01 DIAGNOSIS — C7951 Secondary malignant neoplasm of bone: Secondary | ICD-10-CM | POA: Diagnosis not present

## 2021-09-01 DIAGNOSIS — C7800 Secondary malignant neoplasm of unspecified lung: Secondary | ICD-10-CM | POA: Diagnosis not present

## 2021-09-01 DIAGNOSIS — C7949 Secondary malignant neoplasm of other parts of nervous system: Secondary | ICD-10-CM | POA: Diagnosis not present

## 2021-09-01 DIAGNOSIS — C7931 Secondary malignant neoplasm of brain: Secondary | ICD-10-CM | POA: Diagnosis not present

## 2021-09-01 DIAGNOSIS — C320 Malignant neoplasm of glottis: Secondary | ICD-10-CM | POA: Diagnosis not present

## 2021-09-01 DIAGNOSIS — Z510 Encounter for antineoplastic radiation therapy: Secondary | ICD-10-CM | POA: Diagnosis not present

## 2021-09-01 LAB — RAD ONC ARIA SESSION SUMMARY
Course Elapsed Days: 2
Plan Fractions Treated to Date: 3
Plan Fractions Treated to Date: 3
Plan Prescribed Dose Per Fraction: 4 Gy
Plan Prescribed Dose Per Fraction: 4 Gy
Plan Total Fractions Prescribed: 5
Plan Total Fractions Prescribed: 5
Plan Total Prescribed Dose: 20 Gy
Plan Total Prescribed Dose: 20 Gy
Reference Point Dosage Given to Date: 12 Gy
Reference Point Dosage Given to Date: 12 Gy
Reference Point Session Dosage Given: 4 Gy
Reference Point Session Dosage Given: 4 Gy
Session Number: 3

## 2021-09-02 ENCOUNTER — Inpatient Hospital Stay: Payer: Medicare Other | Admitting: Nurse Practitioner

## 2021-09-02 ENCOUNTER — Other Ambulatory Visit: Payer: Self-pay

## 2021-09-02 ENCOUNTER — Ambulatory Visit
Admission: RE | Admit: 2021-09-02 | Discharge: 2021-09-02 | Disposition: A | Discharge status: Home / Self Care | Payer: Medicare Other | Source: Ambulatory Visit | Attending: Radiation Oncology | Admitting: Radiation Oncology

## 2021-09-02 DIAGNOSIS — C320 Malignant neoplasm of glottis: Secondary | ICD-10-CM | POA: Diagnosis not present

## 2021-09-02 DIAGNOSIS — C7951 Secondary malignant neoplasm of bone: Secondary | ICD-10-CM | POA: Diagnosis not present

## 2021-09-02 DIAGNOSIS — C7949 Secondary malignant neoplasm of other parts of nervous system: Secondary | ICD-10-CM | POA: Diagnosis not present

## 2021-09-02 DIAGNOSIS — C7931 Secondary malignant neoplasm of brain: Secondary | ICD-10-CM | POA: Diagnosis not present

## 2021-09-02 DIAGNOSIS — Z510 Encounter for antineoplastic radiation therapy: Secondary | ICD-10-CM | POA: Diagnosis not present

## 2021-09-02 DIAGNOSIS — C7800 Secondary malignant neoplasm of unspecified lung: Secondary | ICD-10-CM | POA: Diagnosis not present

## 2021-09-02 LAB — RAD ONC ARIA SESSION SUMMARY
Course Elapsed Days: 3
Plan Fractions Treated to Date: 4
Plan Fractions Treated to Date: 4
Plan Prescribed Dose Per Fraction: 4 Gy
Plan Prescribed Dose Per Fraction: 4 Gy
Plan Total Fractions Prescribed: 5
Plan Total Fractions Prescribed: 5
Plan Total Prescribed Dose: 20 Gy
Plan Total Prescribed Dose: 20 Gy
Reference Point Dosage Given to Date: 16 Gy
Reference Point Dosage Given to Date: 16 Gy
Reference Point Session Dosage Given: 4 Gy
Reference Point Session Dosage Given: 4 Gy
Session Number: 4

## 2021-09-05 ENCOUNTER — Other Ambulatory Visit: Payer: Self-pay

## 2021-09-05 ENCOUNTER — Encounter: Payer: Self-pay | Admitting: Radiation Oncology

## 2021-09-05 ENCOUNTER — Inpatient Hospital Stay: Payer: Medicare Other

## 2021-09-05 ENCOUNTER — Ambulatory Visit
Admission: RE | Admit: 2021-09-05 | Discharge: 2021-09-05 | Disposition: A | Discharge status: Home / Self Care | Payer: Medicare Other | Source: Ambulatory Visit | Attending: Radiation Oncology | Admitting: Radiation Oncology

## 2021-09-05 ENCOUNTER — Ambulatory Visit: Payer: Medicare Other

## 2021-09-05 ENCOUNTER — Inpatient Hospital Stay (HOSPITAL_BASED_OUTPATIENT_CLINIC_OR_DEPARTMENT_OTHER): Payer: Medicare Other | Admitting: Nurse Practitioner

## 2021-09-05 ENCOUNTER — Encounter: Payer: Self-pay | Admitting: Nurse Practitioner

## 2021-09-05 VITALS — BP 146/77 | HR 75 | Temp 98.1°F | Resp 18

## 2021-09-05 DIAGNOSIS — Z87891 Personal history of nicotine dependence: Secondary | ICD-10-CM | POA: Diagnosis not present

## 2021-09-05 DIAGNOSIS — C3412 Malignant neoplasm of upper lobe, left bronchus or lung: Secondary | ICD-10-CM

## 2021-09-05 DIAGNOSIS — C7931 Secondary malignant neoplasm of brain: Secondary | ICD-10-CM | POA: Insufficient documentation

## 2021-09-05 DIAGNOSIS — G893 Neoplasm related pain (acute) (chronic): Secondary | ICD-10-CM | POA: Diagnosis not present

## 2021-09-05 DIAGNOSIS — C329 Malignant neoplasm of larynx, unspecified: Secondary | ICD-10-CM | POA: Diagnosis not present

## 2021-09-05 DIAGNOSIS — Z510 Encounter for antineoplastic radiation therapy: Secondary | ICD-10-CM | POA: Diagnosis not present

## 2021-09-05 DIAGNOSIS — Z923 Personal history of irradiation: Secondary | ICD-10-CM | POA: Insufficient documentation

## 2021-09-05 DIAGNOSIS — Z515 Encounter for palliative care: Secondary | ICD-10-CM | POA: Diagnosis not present

## 2021-09-05 DIAGNOSIS — C3411 Malignant neoplasm of upper lobe, right bronchus or lung: Secondary | ICD-10-CM | POA: Diagnosis not present

## 2021-09-05 DIAGNOSIS — C7951 Secondary malignant neoplasm of bone: Secondary | ICD-10-CM | POA: Diagnosis not present

## 2021-09-05 DIAGNOSIS — Z7189 Other specified counseling: Secondary | ICD-10-CM

## 2021-09-05 DIAGNOSIS — C320 Malignant neoplasm of glottis: Secondary | ICD-10-CM | POA: Diagnosis not present

## 2021-09-05 DIAGNOSIS — R531 Weakness: Secondary | ICD-10-CM | POA: Diagnosis not present

## 2021-09-05 LAB — RAD ONC ARIA SESSION SUMMARY
Course Elapsed Days: 6
Plan Fractions Treated to Date: 5
Plan Fractions Treated to Date: 5
Plan Prescribed Dose Per Fraction: 4 Gy
Plan Prescribed Dose Per Fraction: 4 Gy
Plan Total Fractions Prescribed: 5
Plan Total Fractions Prescribed: 5
Plan Total Prescribed Dose: 20 Gy
Plan Total Prescribed Dose: 20 Gy
Reference Point Dosage Given to Date: 20 Gy
Reference Point Dosage Given to Date: 20 Gy
Reference Point Session Dosage Given: 4 Gy
Reference Point Session Dosage Given: 4 Gy
Session Number: 5

## 2021-09-05 NOTE — Progress Notes (Signed)
Palliative Medicine [facility]  [phone] [phone]) [phone]   Name: Logan Price Date: 09/05/2021 MRN: [id number]  DOB: 1945-06-19  Patient Care Team: [name], FNP as PCP - General (Nurse Practitioner) West Bali, MD (Inactive) as Consulting Physician (Gastroenterology) Lonie Peak, MD as Attending Physician (Radiation Oncology) Barrie Folk, RN (Inactive) as Oncology Nurse Navigator Newman Pies, MD as Consulting Physician (Otolaryngology) Randa Spike Kelton Pillar, LCSW as [facility] Care Management (Licensed Clinical Social Worker) Marletta Lor, Hennie Duos, DO as Consulting Physician (Internal Medicine) Pickenpack-Cousar, Arty Baumgartner, NP as Nurse Practitioner (Nurse Practitioner)   INTERVAL HISTORY: Logan Price is a 76 y.o. 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 No further radiation interventions available. Pending further Oncology discussions. Palliative ask to see for symptom management and goals of care.   SOCIAL HISTORY:     reports that he quit smoking about 14 years ago. His smoking use included cigarettes. He has a 40.00 pack-year smoking history. He has never used smokeless tobacco. He reports that he does not currently use alcohol. He reports that he does not use drugs.  ADVANCE DIRECTIVES:  Patient does not have an advanced directive.  He expressed that Aniceto Boss will be considered his medical decision-maker in the event of emergency.  I discussed at length in the absence of healthcare power of attorney documentation that his children would legally be considered his next of kin.  MOLST form completed 08/10/21.   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  MEDICATIONS:  Current Outpatient Medications  Medication Sig Dispense Refill   acetaminophen (TYLENOL) 650 MG CR tablet Take 650 mg by mouth every 8 (eight) hours as needed for pain.     benzonatate (TESSALON PERLES) 100 MG capsule Take 1 capsule (100 mg total) by mouth 3 (three) times daily as needed. (Patient not taking: Reported on 08/10/2021) 20 capsule 0   dexamethasone (DECADRON) 4 MG tablet Take a half tablet daily with breakfast. 30 tablet 0   doxazosin (CARDURA) 1 MG tablet Place 1 tablet (1 mg total) into feeding tube daily. (Patient taking differently: Take 1 mg by mouth daily.) 30 tablet 5   gabapentin (NEURONTIN) 300 MG capsule Take 1 capsule PO TID, about every 8 hours. 90 capsule 0   ondansetron (ZOFRAN) 4 MG tablet Take 1 tablet (4 mg total) by mouth every 8 (eight) hours as needed for nausea or vomiting. (Patient not taking: Reported on 08/10/2021) 20 tablet 0   pantoprazole (PROTONIX) 40 MG tablet TAKE ONE TABLET DAILY 30 MINUTES PRIOR TO THE FIRST MEAL 90 tablet 0   simvastatin (ZOCOR) 20 MG tablet TAKE 1 TABLET DAILY AT 6PM 90 tablet 1   No current facility-administered medications for this visit.    VITAL SIGNS: There were no vitals taken for this visit. There were no vitals filed for this visit.  Estimated body mass index is 24.07 kg/m as calculated  from the following:   Height as of 08/12/21: 5\' 9"  (1.753 m).   Weight as of 08/24/21: 163 lb (73.9 kg).   PERFORMANCE STATUS (ECOG) : 2 - Symptomatic, <50% confined to bed    IMPRESSION: Logan Price and his wife presents to clinic today for follow-up. He completed his final radiation treatment on today. Weak appearing. In wheelchair. Ongoing pain and fatigue.  Appetite has decreased some.   Logan Price continues to take things one day at a time.   Neoplasm related pain Continues to endorse ongoing back pain. Was started on gabapentin per Dr. Basilio Cairo. Is tolerating well. Reports pain decreases to 2-3/10 when taking. He stopped taking his Tylenol ES which he was taking for headaches and other minor aches and pains. Education provided and he is aware he can continue with Tylenol. He states he has been dealing with his headaches.   He feels he is doing fairly well and appreciative of completing radiation. We discussed goals of care and future needs. Logan Price is looking forward to his family coming to visit the first week of July. It has been some time since he has seen his children and sister. He also recently purchased an electronic  scooter to allow him a little more ability to maneuver around places he like.   I approached discussions regarding hospice support in the home given poor long-term prognosis. He and wife verbalized understanding and also have discussed with Dr. Basilio Cairo. Logan Price shares he is now in agreement with the support in the home as he recognizes his health decline. His wife shares she now has to assist with all ADLs due to significant right side weakness.   Education provided on outpatient hospice. We have placed a call to [facility]'s outpatient team. Patient and wife knows to expect a phone call over the next 24 hours to arrange for home visit and admission under their services.   All questions answered and support provided.   I discussed the importance of continued conversation with family and their medical providers regarding overall plan of care and treatment options, ensuring decisions are within the context of the patients values and GOCs.  PLAN: DNR/DNI Tylenol ES for headaches and minor aches and pain. He is still hesitant to start anything stronger.  Gabapentin three times daily. Tolerating with some improvement.  GOC  discussions. Patient and wife realistic in expectations. Confirms wishes for outpatient hospice support. Maygan, RN has been in contact with [facility]'s outpatient team. Patient and wife aware they should receive a call in the next 24-48hrs for home visit.  I will plan to have follow-up phone call in 1-2 weeks.   Patient expressed understanding and was in agreement with this plan. He also understands that He can call the clinic at any time with any questions, concerns, or complaints.     Billing based on MDM: High   Problems Addressed: One acute or chronic illness or injury with exacerbation, progression, cancer, and pain.    Amount and/or Complexity of Data: Category 3:Discussion of management or test interpretation with external physician/other qualified health care professional/appropriate source (not separately reported). Any controlled substances utilized were prescribed in the context of palliative care.    Time Total: 45 min   Visit consisted of counseling and education dealing with the complex and emotionally intense issues of symptom management and palliative care in the setting of serious and potentially life-threatening illness.Greater than 50%  of this time was spent counseling and coordinating care related to the  above assessment and plan.  Willette Alma, AGPCNP-BC  Palliative Medicine Team/[facility]

## 2021-09-06 ENCOUNTER — Telehealth: Payer: Self-pay | Admitting: Nurse Practitioner

## 2021-09-06 ENCOUNTER — Telehealth: Payer: Self-pay

## 2021-09-06 ENCOUNTER — Ambulatory Visit: Payer: Medicare Other

## 2021-09-06 NOTE — Telephone Encounter (Signed)
Scheduled per 6/13 sch msg, pt has been called and confirmed appt

## 2021-09-06 NOTE — Telephone Encounter (Signed)
Logan Price with [facility] to refer pt to home hospice services per [place], NP.

## 2021-09-07 ENCOUNTER — Ambulatory Visit: Payer: Medicare Other

## 2021-09-07 ENCOUNTER — Other Ambulatory Visit: Payer: Self-pay

## 2021-09-07 ENCOUNTER — Telehealth: Payer: Medicare Other

## 2021-09-07 ENCOUNTER — Telehealth: Payer: Self-pay

## 2021-09-07 DIAGNOSIS — Z515 Encounter for palliative care: Secondary | ICD-10-CM

## 2021-09-07 DIAGNOSIS — C3411 Malignant neoplasm of upper lobe, right bronchus or lung: Secondary | ICD-10-CM

## 2021-09-07 DIAGNOSIS — C7951 Secondary malignant neoplasm of bone: Secondary | ICD-10-CM

## 2021-09-07 DIAGNOSIS — C3412 Malignant neoplasm of upper lobe, left bronchus or lung: Secondary | ICD-10-CM

## 2021-09-07 DIAGNOSIS — C7931 Secondary malignant neoplasm of brain: Secondary | ICD-10-CM

## 2021-09-07 NOTE — Telephone Encounter (Signed)
Spoke with patient and scheduled a Mychart Palliative Consult for 09/12/21 @ 1:30 PM.   Consent obtained; updated Netsmart, Team List and Epic.

## 2021-09-07 NOTE — Progress Notes (Signed)
[facility] representative called and reported that pt family was not currently interested in hospice care, but prefers palliative care from [facility]. Per Lexine Baton, NP referral for palliative care services placed.

## 2021-09-08 ENCOUNTER — Ambulatory Visit: Payer: Medicare Other

## 2021-09-09 ENCOUNTER — Ambulatory Visit: Payer: Medicare Other

## 2021-09-09 DIAGNOSIS — R531 Weakness: Secondary | ICD-10-CM | POA: Diagnosis not present

## 2021-09-09 DIAGNOSIS — C7951 Secondary malignant neoplasm of bone: Secondary | ICD-10-CM | POA: Diagnosis not present

## 2021-09-09 DIAGNOSIS — I4891 Unspecified atrial fibrillation: Secondary | ICD-10-CM | POA: Diagnosis not present

## 2021-09-09 DIAGNOSIS — C7931 Secondary malignant neoplasm of brain: Secondary | ICD-10-CM | POA: Diagnosis not present

## 2021-09-09 DIAGNOSIS — C7800 Secondary malignant neoplasm of unspecified lung: Secondary | ICD-10-CM | POA: Diagnosis not present

## 2021-09-09 DIAGNOSIS — E785 Hyperlipidemia, unspecified: Secondary | ICD-10-CM | POA: Diagnosis not present

## 2021-09-09 DIAGNOSIS — I10 Essential (primary) hypertension: Secondary | ICD-10-CM | POA: Diagnosis not present

## 2021-09-09 DIAGNOSIS — C329 Malignant neoplasm of larynx, unspecified: Secondary | ICD-10-CM | POA: Diagnosis not present

## 2021-09-09 DIAGNOSIS — R0602 Shortness of breath: Secondary | ICD-10-CM | POA: Diagnosis not present

## 2021-09-09 DIAGNOSIS — R1310 Dysphagia, unspecified: Secondary | ICD-10-CM | POA: Diagnosis not present

## 2021-09-09 DIAGNOSIS — N401 Enlarged prostate with lower urinary tract symptoms: Secondary | ICD-10-CM | POA: Diagnosis not present

## 2021-09-09 DIAGNOSIS — K219 Gastro-esophageal reflux disease without esophagitis: Secondary | ICD-10-CM | POA: Diagnosis not present

## 2021-09-09 DIAGNOSIS — E559 Vitamin D deficiency, unspecified: Secondary | ICD-10-CM | POA: Diagnosis not present

## 2021-09-12 ENCOUNTER — Telehealth: Payer: Self-pay | Admitting: Family Medicine

## 2021-09-12 ENCOUNTER — Telehealth: Payer: Medicare Other | Admitting: Family Medicine

## 2021-09-12 DIAGNOSIS — I10 Essential (primary) hypertension: Secondary | ICD-10-CM | POA: Diagnosis not present

## 2021-09-12 DIAGNOSIS — C7931 Secondary malignant neoplasm of brain: Secondary | ICD-10-CM | POA: Diagnosis not present

## 2021-09-12 DIAGNOSIS — C7951 Secondary malignant neoplasm of bone: Secondary | ICD-10-CM | POA: Diagnosis not present

## 2021-09-12 DIAGNOSIS — C329 Malignant neoplasm of larynx, unspecified: Secondary | ICD-10-CM | POA: Diagnosis not present

## 2021-09-12 DIAGNOSIS — C7800 Secondary malignant neoplasm of unspecified lung: Secondary | ICD-10-CM | POA: Diagnosis not present

## 2021-09-12 DIAGNOSIS — E785 Hyperlipidemia, unspecified: Secondary | ICD-10-CM | POA: Diagnosis not present

## 2021-09-12 NOTE — Telephone Encounter (Signed)
Sent link for My Chart Video Visit and got no response.  TCT pt's cell phone and left message regarding missed appointment and offering for reschedule in person tomorrow afternoon or call back.  Damaris Hippo FNP-C

## 2021-09-15 DIAGNOSIS — C7800 Secondary malignant neoplasm of unspecified lung: Secondary | ICD-10-CM | POA: Diagnosis not present

## 2021-09-15 DIAGNOSIS — C7951 Secondary malignant neoplasm of bone: Secondary | ICD-10-CM | POA: Diagnosis not present

## 2021-09-15 DIAGNOSIS — C7931 Secondary malignant neoplasm of brain: Secondary | ICD-10-CM | POA: Diagnosis not present

## 2021-09-15 DIAGNOSIS — C329 Malignant neoplasm of larynx, unspecified: Secondary | ICD-10-CM | POA: Diagnosis not present

## 2021-09-15 DIAGNOSIS — E785 Hyperlipidemia, unspecified: Secondary | ICD-10-CM | POA: Diagnosis not present

## 2021-09-15 DIAGNOSIS — I10 Essential (primary) hypertension: Secondary | ICD-10-CM | POA: Diagnosis not present

## 2021-09-17 DIAGNOSIS — I10 Essential (primary) hypertension: Secondary | ICD-10-CM | POA: Diagnosis not present

## 2021-09-17 DIAGNOSIS — E785 Hyperlipidemia, unspecified: Secondary | ICD-10-CM | POA: Diagnosis not present

## 2021-09-17 DIAGNOSIS — C7951 Secondary malignant neoplasm of bone: Secondary | ICD-10-CM | POA: Diagnosis not present

## 2021-09-17 DIAGNOSIS — C7800 Secondary malignant neoplasm of unspecified lung: Secondary | ICD-10-CM | POA: Diagnosis not present

## 2021-09-17 DIAGNOSIS — C329 Malignant neoplasm of larynx, unspecified: Secondary | ICD-10-CM | POA: Diagnosis not present

## 2021-09-17 DIAGNOSIS — C7931 Secondary malignant neoplasm of brain: Secondary | ICD-10-CM | POA: Diagnosis not present

## 2021-09-19 ENCOUNTER — Telehealth: Payer: Self-pay

## 2021-09-20 ENCOUNTER — Telehealth: Payer: Self-pay

## 2021-09-20 ENCOUNTER — Ambulatory Visit: Payer: Medicare Other | Admitting: Family Medicine

## 2021-09-21 ENCOUNTER — Encounter: Payer: Self-pay | Admitting: Family

## 2021-09-21 DIAGNOSIS — C7951 Secondary malignant neoplasm of bone: Secondary | ICD-10-CM | POA: Diagnosis not present

## 2021-09-21 DIAGNOSIS — C7800 Secondary malignant neoplasm of unspecified lung: Secondary | ICD-10-CM | POA: Diagnosis not present

## 2021-09-21 DIAGNOSIS — I10 Essential (primary) hypertension: Secondary | ICD-10-CM | POA: Diagnosis not present

## 2021-09-21 DIAGNOSIS — C7931 Secondary malignant neoplasm of brain: Secondary | ICD-10-CM | POA: Diagnosis not present

## 2021-09-21 DIAGNOSIS — E785 Hyperlipidemia, unspecified: Secondary | ICD-10-CM | POA: Diagnosis not present

## 2021-09-21 DIAGNOSIS — C329 Malignant neoplasm of larynx, unspecified: Secondary | ICD-10-CM | POA: Diagnosis not present

## 2021-09-22 ENCOUNTER — Encounter: Payer: Self-pay | Admitting: Nurse Practitioner

## 2021-09-22 ENCOUNTER — Inpatient Hospital Stay (HOSPITAL_BASED_OUTPATIENT_CLINIC_OR_DEPARTMENT_OTHER): Payer: Medicare Other | Admitting: Nurse Practitioner

## 2021-09-22 DIAGNOSIS — C7931 Secondary malignant neoplasm of brain: Secondary | ICD-10-CM

## 2021-09-22 DIAGNOSIS — G893 Neoplasm related pain (acute) (chronic): Secondary | ICD-10-CM | POA: Diagnosis not present

## 2021-09-22 DIAGNOSIS — C720 Malignant neoplasm of spinal cord: Secondary | ICD-10-CM

## 2021-09-22 DIAGNOSIS — R530 Neoplastic (malignant) related fatigue: Secondary | ICD-10-CM | POA: Diagnosis not present

## 2021-09-22 DIAGNOSIS — Z7189 Other specified counseling: Secondary | ICD-10-CM

## 2021-09-22 DIAGNOSIS — R531 Weakness: Secondary | ICD-10-CM | POA: Diagnosis not present

## 2021-09-22 DIAGNOSIS — Z515 Encounter for palliative care: Secondary | ICD-10-CM

## 2021-09-22 NOTE — Progress Notes (Signed)
Palliative Medicine [facility]  Telephone:([phone]) [phone] [phone]) [phone]   Name: Logan Price Date: 09/22/2021 MRN: [id number]  DOB: January 16, 1946  Patient Care Team: [name], FNP as PCP - General (Nurse Practitioner) West Bali, MD (Inactive) as Consulting Physician (Gastroenterology) Lonie Peak, MD as Attending Physician (Radiation Oncology) Barrie Folk, RN (Inactive) as Oncology Nurse Navigator Newman Pies, MD as Consulting Physician (Otolaryngology) Isaiah Blakes, LCSW as [facility] Care Management (Licensed Clinical Social Worker) Marletta Lor, Hennie Duos, DO as Consulting Physician (Internal Medicine) Pickenpack-Cousar, Arty Baumgartner, NP as Nurse Practitioner (Nurse Practitioner)   I connected with Logan Price on 09/22/21 at  2:00 PM EDT by phone and verified that I am speaking with the correct person using two identifiers.   I discussed the limitations, risks, security and privacy concerns of performing an evaluation and management service by telemedicine and the availability of in-person appointments. I also discussed with the patient that there may be a patient responsible charge related to this service. The patient expressed understanding and agreed to proceed.   Other persons participating in the visit and their role in the encounter: wife and Maygan, RN    Patient's location: Home   Provider's location: [facility]     INTERVAL HISTORY: JOSIYAH HENAO is a 76 y.o. 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 No further radiation interventions available. Pending further Oncology discussions. Palliative ask to see for symptom management and goals of care.   SOCIAL HISTORY:     reports that he quit smoking about 14 years ago.  His smoking use included cigarettes. He has a 40.00 pack-year smoking history. He has never used smokeless tobacco. He reports that he does not currently use alcohol. He reports that he does not use drugs.  ADVANCE DIRECTIVES:  Patient does not have an advanced directive.  He expressed that Aniceto Boss will be considered his medical decision-maker in the event of emergency.  I discussed at length in the absence of healthcare power of attorney documentation that his children would legally be considered his next of kin.  MOLST form completed 08/10/21.   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  MEDICATIONS:  Current Outpatient Medications  Medication Sig Dispense Refill   acetaminophen (TYLENOL) 650 MG CR tablet Take 650 mg by mouth every 8 (eight) hours as needed for pain.     benzonatate (TESSALON PERLES) 100 MG capsule Take 1 capsule (100 mg total) by mouth 3 (three) times daily as needed. (Patient not taking: Reported on 08/10/2021) 20 capsule 0   dexamethasone (DECADRON) 4 MG tablet Take a half tablet daily with breakfast. 30 tablet 0   doxazosin (CARDURA) 1 MG tablet Place 1 tablet (1 mg total) into feeding tube daily. (Patient taking differently: Take 1 mg by mouth daily.) 30 tablet 5   gabapentin (NEURONTIN) 300 MG capsule Take 1 capsule PO TID, about every 8  hours. 90 capsule 0   ondansetron (ZOFRAN) 4 MG tablet Take 1 tablet (4 mg total) by mouth every 8 (eight) hours as needed for nausea or vomiting. (Patient not taking: Reported on 08/10/2021) 20 tablet 0   pantoprazole (PROTONIX) 40 MG tablet TAKE ONE TABLET DAILY 30 MINUTES PRIOR TO THE FIRST MEAL 90 tablet 0    simvastatin (ZOCOR) 20 MG tablet TAKE 1 TABLET DAILY AT 6PM 90 tablet 1   No current facility-administered medications for this visit.    VITAL SIGNS: There were no vitals taken for this visit. There were no vitals filed for this visit.  Estimated body mass index is 24.07 kg/m as calculated from the following:   Height as of 08/12/21: 5\' 9"  (1.753 m).   Weight as of 08/24/21: 163 lb (73.9 kg).   PERFORMANCE STATUS (ECOG) : 2 - Symptomatic, <50% confined to bed   IMPRESSION: I connected with Mr. Burno and his wife via phone today.  No acute distress noted.  Reports he is taking things 1 day at a time.  He is feeling okay.  During previous visit he requested to Dr. Basilio Cairo and myself wishes to proceed with outpatient hospice support.  This was arranged however after several meetings patient declined ongoing services.  Recently woke up and his left eye was slightly swollen and hard to open. He was scheduled to see his PCP regarding this on Tuesday but did not have transportation. Extensive discussions and assessment. He continues to decline any neurological changes. Denies drainage or pain. Denies injury. Vision is unchanged. He can open eye with his hand. Is not interested in doing anything further and will notify team if worsens or any other changes occur.    Neoplasm related pain Continues to endorse ongoing back pain. He is taking his gabapentin as prescribed. Pain is decreased but is still there. Has restarted his tylenol.   Offered additional pain support however he does not wish to take anything else at this time but knows we can discuss at anytime.   All questions answered and support provided.   I discussed the importance of continued conversation with family and their medical providers regarding overall plan of care and treatment options, ensuring decisions are within the context of the patients values and GOCs.  PLAN: DNR/DNI Tylenol ES for headaches and minor aches and pain.  He is still hesitant to start anything stronger.  Gabapentin three times daily. Tolerating with some improvement.  GOC discussions. Patient and wife realistic in expectations. He has rescinded hospice support. Wife states she is not ready for that and they would like to take things one day at a time. Aware we can re-initiate services at anytime.  I will plan to have follow-up phone call in 1-2 weeks.   Patient expressed understanding and was in agreement with this plan. He also understands that He can call the clinic at any time with any questions, concerns, or complaints.    Time Total: 20 min    Visit consisted of counseling and education dealing with the complex and emotionally intense issues of symptom management and palliative care in the setting of serious and potentially life-threatening illness.Greater than 50%  of this time was spent counseling and coordinating care related to the above assessment and plan.  Willette Alma, AGPCNP-BC  Palliative Medicine Team/[facility]

## 2021-09-24 DIAGNOSIS — N401 Enlarged prostate with lower urinary tract symptoms: Secondary | ICD-10-CM | POA: Diagnosis not present

## 2021-09-24 DIAGNOSIS — C7931 Secondary malignant neoplasm of brain: Secondary | ICD-10-CM | POA: Diagnosis not present

## 2021-09-24 DIAGNOSIS — R1310 Dysphagia, unspecified: Secondary | ICD-10-CM | POA: Diagnosis not present

## 2021-09-24 DIAGNOSIS — K219 Gastro-esophageal reflux disease without esophagitis: Secondary | ICD-10-CM | POA: Diagnosis not present

## 2021-09-24 DIAGNOSIS — E559 Vitamin D deficiency, unspecified: Secondary | ICD-10-CM | POA: Diagnosis not present

## 2021-09-24 DIAGNOSIS — R0602 Shortness of breath: Secondary | ICD-10-CM | POA: Diagnosis not present

## 2021-09-24 DIAGNOSIS — C7800 Secondary malignant neoplasm of unspecified lung: Secondary | ICD-10-CM | POA: Diagnosis not present

## 2021-09-24 DIAGNOSIS — C7951 Secondary malignant neoplasm of bone: Secondary | ICD-10-CM | POA: Diagnosis not present

## 2021-09-24 DIAGNOSIS — C329 Malignant neoplasm of larynx, unspecified: Secondary | ICD-10-CM | POA: Diagnosis not present

## 2021-09-24 DIAGNOSIS — E785 Hyperlipidemia, unspecified: Secondary | ICD-10-CM | POA: Diagnosis not present

## 2021-09-24 DIAGNOSIS — I4891 Unspecified atrial fibrillation: Secondary | ICD-10-CM | POA: Diagnosis not present

## 2021-09-24 DIAGNOSIS — R531 Weakness: Secondary | ICD-10-CM | POA: Diagnosis not present

## 2021-09-24 DIAGNOSIS — I10 Essential (primary) hypertension: Secondary | ICD-10-CM | POA: Diagnosis not present

## 2021-09-25 DIAGNOSIS — R000 Tachycardia, unspecified: Secondary | ICD-10-CM | POA: Diagnosis not present

## 2021-09-25 DIAGNOSIS — I10 Essential (primary) hypertension: Secondary | ICD-10-CM | POA: Diagnosis not present

## 2021-09-25 DIAGNOSIS — E785 Hyperlipidemia, unspecified: Secondary | ICD-10-CM | POA: Diagnosis not present

## 2021-09-25 DIAGNOSIS — C329 Malignant neoplasm of larynx, unspecified: Secondary | ICD-10-CM | POA: Diagnosis not present

## 2021-09-25 DIAGNOSIS — M549 Dorsalgia, unspecified: Secondary | ICD-10-CM | POA: Diagnosis not present

## 2021-09-25 DIAGNOSIS — C7800 Secondary malignant neoplasm of unspecified lung: Secondary | ICD-10-CM | POA: Diagnosis not present

## 2021-09-25 DIAGNOSIS — C7931 Secondary malignant neoplasm of brain: Secondary | ICD-10-CM | POA: Diagnosis not present

## 2021-09-25 DIAGNOSIS — C7951 Secondary malignant neoplasm of bone: Secondary | ICD-10-CM | POA: Diagnosis not present

## 2021-09-28 NOTE — Progress Notes (Signed)
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   Plan: The patient will follow-up with radiation oncology in 27mo.  -----------------------------------  Lonie Peak, MD

## 2021-10-10 ENCOUNTER — Ambulatory Visit: Payer: Medicare Other | Admitting: Family

## 2021-10-12 ENCOUNTER — Ambulatory Visit: Payer: Self-pay | Admitting: Radiation Oncology

## 2021-10-17 ENCOUNTER — Encounter (HOSPITAL_COMMUNITY): Payer: Self-pay | Admitting: Physical Therapy

## 2021-10-17 NOTE — Therapy (Signed)
[place], [name], [address] Phone: [phone]   Fax:  [phone]  Patient Details  Name: YING ROCKS MRN: [id number] Date of Birth: 09-09-1945 Referring Provider:  No ref. provider found  Encounter Date: 10/17/2021  PHYSICAL THERAPY DISCHARGE SUMMARY  Visits from Start of Care: 5  Current functional level related to goals / functional outcomes: Patient had not returned during POC and has since passed away.   Remaining deficits: N/a   Education / Equipment: N/a   Patient agrees to discharge. Patient goals were not met. Patient is being discharged due to not returning since the last visit.    Mearl Latin, PT 10/17/2021, 2:52 PM  [facility] [address] [place], [name], [address] Phone: [phone]   Fax:  [phone]

## 2021-10-25 DEATH — deceased

## 2021-11-22 ENCOUNTER — Ambulatory Visit: Payer: Medicare Other | Admitting: Hematology and Oncology
